# Patient Record
Sex: Female | Born: 1935
Health system: Southern US, Community
[De-identification: ages and names within clinical notes are randomized; demographics above are authoritative.]

## PROBLEM LIST (undated history)

## (undated) ENCOUNTER — Emergency Department (HOSPITAL_BASED_OUTPATIENT_CLINIC_OR_DEPARTMENT_OTHER): Admission: EM | Discharge status: Absent without leave | Payer: Medicare Other

## (undated) DIAGNOSIS — I10 Essential (primary) hypertension: Secondary | ICD-10-CM

## (undated) DIAGNOSIS — C801 Malignant (primary) neoplasm, unspecified: Secondary | ICD-10-CM

## (undated) DIAGNOSIS — R001 Bradycardia, unspecified: Secondary | ICD-10-CM

## (undated) DIAGNOSIS — Z853 Personal history of malignant neoplasm of breast: Secondary | ICD-10-CM

## (undated) DIAGNOSIS — J189 Pneumonia, unspecified organism: Secondary | ICD-10-CM

## (undated) DIAGNOSIS — I482 Chronic atrial fibrillation, unspecified: Secondary | ICD-10-CM

## (undated) DIAGNOSIS — I4819 Other persistent atrial fibrillation: Secondary | ICD-10-CM

## (undated) DIAGNOSIS — M8580 Other specified disorders of bone density and structure, unspecified site: Secondary | ICD-10-CM

## (undated) DIAGNOSIS — I5032 Chronic diastolic (congestive) heart failure: Secondary | ICD-10-CM

## (undated) DIAGNOSIS — I499 Cardiac arrhythmia, unspecified: Secondary | ICD-10-CM

## (undated) DIAGNOSIS — E785 Hyperlipidemia, unspecified: Secondary | ICD-10-CM

## (undated) DIAGNOSIS — I712 Thoracic aortic aneurysm, without rupture: Secondary | ICD-10-CM

## (undated) DIAGNOSIS — K5792 Diverticulitis of intestine, part unspecified, without perforation or abscess without bleeding: Secondary | ICD-10-CM

## (undated) DIAGNOSIS — F5104 Psychophysiologic insomnia: Secondary | ICD-10-CM

## (undated) DIAGNOSIS — K5790 Diverticulosis of intestine, part unspecified, without perforation or abscess without bleeding: Secondary | ICD-10-CM

## (undated) DIAGNOSIS — E119 Type 2 diabetes mellitus without complications: Secondary | ICD-10-CM

## (undated) DIAGNOSIS — S329XXA Fracture of unspecified parts of lumbosacral spine and pelvis, initial encounter for closed fracture: Secondary | ICD-10-CM

## (undated) DIAGNOSIS — R0600 Dyspnea, unspecified: Secondary | ICD-10-CM

## (undated) DIAGNOSIS — Z7901 Long term (current) use of anticoagulants: Secondary | ICD-10-CM

## (undated) DIAGNOSIS — I358 Other nonrheumatic aortic valve disorders: Secondary | ICD-10-CM

## (undated) DIAGNOSIS — M1990 Unspecified osteoarthritis, unspecified site: Secondary | ICD-10-CM

## (undated) DIAGNOSIS — R9430 Abnormal result of cardiovascular function study, unspecified: Secondary | ICD-10-CM

## (undated) DIAGNOSIS — S7290XA Unspecified fracture of unspecified femur, initial encounter for closed fracture: Secondary | ICD-10-CM

## (undated) DIAGNOSIS — R55 Syncope and collapse: Secondary | ICD-10-CM

## (undated) HISTORY — DX: Diverticulitis of intestine, part unspecified, without perforation or abscess without bleeding: K57.92

## (undated) HISTORY — PX: SHOULDER SURGERY: SHX246

## (undated) HISTORY — DX: Hyperlipidemia, unspecified: E78.5

## (undated) HISTORY — DX: Abnormal result of cardiovascular function study, unspecified: R94.30

## (undated) HISTORY — PX: WRIST SURGERY: SHX841

## (undated) HISTORY — DX: Essential (primary) hypertension: I10

## (undated) HISTORY — DX: Personal history of malignant neoplasm of breast: Z85.3

## (undated) HISTORY — PX: EYE SURGERY: SHX253

## (undated) HISTORY — DX: Psychophysiologic insomnia: F51.04

## (undated) HISTORY — DX: Other persistent atrial fibrillation: I48.19

## (undated) HISTORY — PX: TONSILLECTOMY: SUR1361

## (undated) HISTORY — DX: Fracture of unspecified parts of lumbosacral spine and pelvis, initial encounter for closed fracture: S32.9XXA

## (undated) HISTORY — DX: Thoracic aortic aneurysm, without rupture: I71.2

## (undated) HISTORY — DX: Other nonrheumatic aortic valve disorders: I35.8

## (undated) HISTORY — DX: Bradycardia, unspecified: R00.1

## (undated) HISTORY — DX: Unspecified osteoarthritis, unspecified site: M19.90

## (undated) HISTORY — DX: Syncope and collapse: R55

## (undated) HISTORY — DX: Diverticulosis of intestine, part unspecified, without perforation or abscess without bleeding: K57.90

## (undated) HISTORY — DX: Unspecified fracture of unspecified femur, initial encounter for closed fracture: S72.90XA

## (undated) HISTORY — DX: Chronic diastolic (congestive) heart failure: I50.32

## (undated) HISTORY — DX: Chronic atrial fibrillation, unspecified: I48.20

## (undated) HISTORY — DX: Type 2 diabetes mellitus without complications: E11.9

## (undated) HISTORY — DX: Long term (current) use of anticoagulants: Z79.01

---

## 1993-05-24 HISTORY — PX: MASTECTOMY: SHX3

## 1993-05-24 HISTORY — PX: TOTAL ABDOMINAL HYSTERECTOMY W/ BILATERAL SALPINGOOPHORECTOMY: SHX83

## 1997-08-26 ENCOUNTER — Inpatient Hospital Stay (HOSPITAL_COMMUNITY): Admission: RE | Admit: 1997-08-26 | Discharge: 1997-08-27 | Payer: Self-pay | Admitting: General Surgery

## 1997-09-09 ENCOUNTER — Other Ambulatory Visit: Admission: RE | Admit: 1997-09-09 | Discharge: 1997-09-09 | Payer: Self-pay | Admitting: Oncology

## 1998-09-17 ENCOUNTER — Other Ambulatory Visit: Admission: RE | Admit: 1998-09-17 | Discharge: 1998-09-17 | Payer: Self-pay | Admitting: Obstetrics & Gynecology

## 1999-09-21 ENCOUNTER — Encounter: Payer: Self-pay | Admitting: General Surgery

## 1999-09-23 ENCOUNTER — Encounter (INDEPENDENT_AMBULATORY_CARE_PROVIDER_SITE_OTHER): Payer: Self-pay | Admitting: *Deleted

## 1999-09-23 ENCOUNTER — Encounter: Payer: Self-pay | Admitting: General Surgery

## 1999-09-23 ENCOUNTER — Ambulatory Visit (HOSPITAL_COMMUNITY): Admission: RE | Admit: 1999-09-23 | Discharge: 1999-09-23 | Payer: Self-pay | Admitting: General Surgery

## 2000-02-26 ENCOUNTER — Encounter: Admission: RE | Admit: 2000-02-26 | Discharge: 2000-03-04 | Payer: Self-pay | Admitting: General Surgery

## 2000-10-14 ENCOUNTER — Encounter (HOSPITAL_COMMUNITY): Admission: RE | Admit: 2000-10-14 | Discharge: 2000-11-13 | Payer: Self-pay | Admitting: Oncology

## 2000-10-14 ENCOUNTER — Encounter: Admission: RE | Admit: 2000-10-14 | Discharge: 2000-10-14 | Payer: Self-pay | Admitting: Oncology

## 2001-02-27 ENCOUNTER — Encounter: Admission: RE | Admit: 2001-02-27 | Discharge: 2001-02-27 | Payer: Self-pay | Admitting: Internal Medicine

## 2001-02-27 ENCOUNTER — Encounter: Payer: Self-pay | Admitting: Internal Medicine

## 2001-04-11 ENCOUNTER — Encounter: Admission: RE | Admit: 2001-04-11 | Discharge: 2001-04-11 | Payer: Self-pay | Admitting: Oncology

## 2001-04-11 ENCOUNTER — Encounter (HOSPITAL_COMMUNITY): Admission: RE | Admit: 2001-04-11 | Discharge: 2001-05-11 | Payer: Self-pay | Admitting: Oncology

## 2001-08-23 ENCOUNTER — Emergency Department (HOSPITAL_COMMUNITY): Admission: EM | Admit: 2001-08-23 | Discharge: 2001-08-24 | Payer: Self-pay | Admitting: Emergency Medicine

## 2001-08-24 ENCOUNTER — Encounter: Payer: Self-pay | Admitting: Emergency Medicine

## 2001-09-25 ENCOUNTER — Encounter: Payer: Self-pay | Admitting: Orthopedic Surgery

## 2001-10-02 ENCOUNTER — Inpatient Hospital Stay (HOSPITAL_COMMUNITY): Admission: RE | Admit: 2001-10-02 | Discharge: 2001-10-03 | Payer: Self-pay | Admitting: Orthopedic Surgery

## 2001-10-07 ENCOUNTER — Emergency Department (HOSPITAL_COMMUNITY): Admission: EM | Admit: 2001-10-07 | Discharge: 2001-10-07 | Payer: Self-pay | Admitting: Emergency Medicine

## 2001-10-07 ENCOUNTER — Encounter: Payer: Self-pay | Admitting: Emergency Medicine

## 2001-11-14 ENCOUNTER — Encounter: Admission: RE | Admit: 2001-11-14 | Discharge: 2001-12-22 | Payer: Self-pay | Admitting: Orthopedic Surgery

## 2002-03-27 ENCOUNTER — Encounter (HOSPITAL_COMMUNITY): Admission: RE | Admit: 2002-03-27 | Discharge: 2002-04-26 | Payer: Self-pay | Admitting: Oncology

## 2002-03-27 ENCOUNTER — Encounter: Admission: RE | Admit: 2002-03-27 | Discharge: 2002-03-27 | Payer: Self-pay | Admitting: Oncology

## 2002-08-15 ENCOUNTER — Encounter: Payer: Self-pay | Admitting: General Surgery

## 2002-08-15 ENCOUNTER — Encounter (INDEPENDENT_AMBULATORY_CARE_PROVIDER_SITE_OTHER): Payer: Self-pay | Admitting: *Deleted

## 2002-08-15 ENCOUNTER — Encounter: Admission: RE | Admit: 2002-08-15 | Discharge: 2002-08-15 | Payer: Self-pay | Admitting: General Surgery

## 2002-09-25 ENCOUNTER — Encounter (HOSPITAL_COMMUNITY): Admission: RE | Admit: 2002-09-25 | Discharge: 2002-10-25 | Payer: Self-pay | Admitting: Oncology

## 2002-09-25 ENCOUNTER — Encounter: Admission: RE | Admit: 2002-09-25 | Discharge: 2002-09-25 | Payer: Self-pay | Admitting: Oncology

## 2003-01-01 ENCOUNTER — Encounter: Payer: Self-pay | Admitting: Internal Medicine

## 2003-01-01 ENCOUNTER — Encounter: Admission: RE | Admit: 2003-01-01 | Discharge: 2003-01-01 | Payer: Self-pay | Admitting: Internal Medicine

## 2003-03-27 ENCOUNTER — Encounter: Admission: RE | Admit: 2003-03-27 | Discharge: 2003-03-27 | Payer: Self-pay | Admitting: Oncology

## 2003-05-25 DIAGNOSIS — S7290XA Unspecified fracture of unspecified femur, initial encounter for closed fracture: Secondary | ICD-10-CM

## 2003-05-25 HISTORY — PX: FEMUR SURGERY: SHX943

## 2003-05-25 HISTORY — DX: Unspecified fracture of unspecified femur, initial encounter for closed fracture: S72.90XA

## 2003-08-26 ENCOUNTER — Encounter: Admission: RE | Admit: 2003-08-26 | Discharge: 2003-08-26 | Payer: Self-pay | Admitting: General Surgery

## 2003-09-05 ENCOUNTER — Inpatient Hospital Stay (HOSPITAL_COMMUNITY): Admission: EM | Admit: 2003-09-05 | Discharge: 2003-09-07 | Payer: Self-pay | Admitting: Family Medicine

## 2003-09-27 ENCOUNTER — Encounter: Admission: RE | Admit: 2003-09-27 | Discharge: 2003-09-27 | Payer: Self-pay | Admitting: Oncology

## 2003-09-27 ENCOUNTER — Encounter (HOSPITAL_COMMUNITY): Admission: RE | Admit: 2003-09-27 | Discharge: 2003-10-27 | Payer: Self-pay | Admitting: Oncology

## 2004-03-02 ENCOUNTER — Ambulatory Visit: Payer: Self-pay | Admitting: Physical Medicine & Rehabilitation

## 2004-03-02 ENCOUNTER — Inpatient Hospital Stay (HOSPITAL_COMMUNITY): Admission: EM | Admit: 2004-03-02 | Discharge: 2004-03-09 | Payer: Self-pay | Admitting: Emergency Medicine

## 2004-03-02 ENCOUNTER — Ambulatory Visit: Payer: Self-pay | Admitting: Internal Medicine

## 2004-03-09 ENCOUNTER — Inpatient Hospital Stay (HOSPITAL_COMMUNITY)
Admission: RE | Admit: 2004-03-09 | Discharge: 2004-03-17 | Payer: Self-pay | Admitting: Physical Medicine & Rehabilitation

## 2004-03-30 ENCOUNTER — Ambulatory Visit: Payer: Self-pay | Admitting: Cardiology

## 2004-04-06 ENCOUNTER — Ambulatory Visit: Payer: Self-pay | Admitting: Internal Medicine

## 2004-04-29 ENCOUNTER — Ambulatory Visit: Payer: Self-pay | Admitting: Internal Medicine

## 2004-05-24 DIAGNOSIS — S329XXA Fracture of unspecified parts of lumbosacral spine and pelvis, initial encounter for closed fracture: Secondary | ICD-10-CM

## 2004-05-24 HISTORY — DX: Fracture of unspecified parts of lumbosacral spine and pelvis, initial encounter for closed fracture: S32.9XXA

## 2004-06-03 ENCOUNTER — Ambulatory Visit: Payer: Self-pay | Admitting: Internal Medicine

## 2004-06-18 ENCOUNTER — Ambulatory Visit: Payer: Self-pay | Admitting: Internal Medicine

## 2004-07-16 ENCOUNTER — Ambulatory Visit: Payer: Self-pay | Admitting: Internal Medicine

## 2004-08-19 ENCOUNTER — Ambulatory Visit: Payer: Self-pay | Admitting: Internal Medicine

## 2004-08-26 ENCOUNTER — Ambulatory Visit: Payer: Self-pay | Admitting: Internal Medicine

## 2004-08-26 ENCOUNTER — Encounter: Admission: RE | Admit: 2004-08-26 | Discharge: 2004-08-26 | Payer: Self-pay | Admitting: General Surgery

## 2004-08-27 ENCOUNTER — Ambulatory Visit: Payer: Self-pay | Admitting: Internal Medicine

## 2004-09-02 ENCOUNTER — Ambulatory Visit: Payer: Self-pay | Admitting: Internal Medicine

## 2004-09-28 ENCOUNTER — Encounter: Admission: RE | Admit: 2004-09-28 | Discharge: 2004-09-28 | Payer: Self-pay | Admitting: Oncology

## 2004-09-28 ENCOUNTER — Encounter (HOSPITAL_COMMUNITY): Admission: RE | Admit: 2004-09-28 | Discharge: 2004-10-28 | Payer: Self-pay | Admitting: Oncology

## 2004-09-28 ENCOUNTER — Ambulatory Visit (HOSPITAL_COMMUNITY): Payer: Self-pay | Admitting: Oncology

## 2004-10-02 ENCOUNTER — Ambulatory Visit: Payer: Self-pay | Admitting: Internal Medicine

## 2004-10-14 ENCOUNTER — Other Ambulatory Visit: Admission: RE | Admit: 2004-10-14 | Discharge: 2004-10-14 | Payer: Self-pay | Admitting: Obstetrics & Gynecology

## 2004-10-23 ENCOUNTER — Ambulatory Visit: Payer: Self-pay | Admitting: Internal Medicine

## 2004-11-04 ENCOUNTER — Ambulatory Visit: Payer: Self-pay | Admitting: Internal Medicine

## 2004-11-04 ENCOUNTER — Ambulatory Visit: Payer: Self-pay | Admitting: Gastroenterology

## 2004-11-18 ENCOUNTER — Ambulatory Visit: Payer: Self-pay | Admitting: Internal Medicine

## 2004-12-16 ENCOUNTER — Ambulatory Visit: Payer: Self-pay | Admitting: Internal Medicine

## 2004-12-23 ENCOUNTER — Ambulatory Visit: Payer: Self-pay | Admitting: Internal Medicine

## 2004-12-31 ENCOUNTER — Ambulatory Visit: Payer: Self-pay | Admitting: Physical Medicine & Rehabilitation

## 2004-12-31 ENCOUNTER — Inpatient Hospital Stay (HOSPITAL_COMMUNITY): Admission: EM | Admit: 2004-12-31 | Discharge: 2005-01-05 | Payer: Self-pay | Admitting: Emergency Medicine

## 2004-12-31 ENCOUNTER — Ambulatory Visit: Payer: Self-pay | Admitting: Internal Medicine

## 2005-01-05 ENCOUNTER — Inpatient Hospital Stay
Admission: RE | Admit: 2005-01-05 | Discharge: 2005-01-12 | Payer: Self-pay | Admitting: Physical Medicine & Rehabilitation

## 2005-02-18 ENCOUNTER — Ambulatory Visit: Payer: Self-pay | Admitting: Internal Medicine

## 2005-03-02 ENCOUNTER — Ambulatory Visit: Payer: Self-pay | Admitting: Internal Medicine

## 2005-03-24 ENCOUNTER — Ambulatory Visit: Payer: Self-pay | Admitting: Internal Medicine

## 2005-04-06 ENCOUNTER — Encounter: Admission: RE | Admit: 2005-04-06 | Discharge: 2005-04-06 | Payer: Self-pay | Admitting: Specialist

## 2005-04-07 ENCOUNTER — Encounter: Admission: RE | Admit: 2005-04-07 | Discharge: 2005-04-07 | Payer: Self-pay | Admitting: Specialist

## 2005-04-28 ENCOUNTER — Ambulatory Visit: Payer: Self-pay | Admitting: Internal Medicine

## 2005-05-04 ENCOUNTER — Ambulatory Visit: Payer: Self-pay | Admitting: Internal Medicine

## 2005-05-31 ENCOUNTER — Ambulatory Visit: Payer: Self-pay | Admitting: Internal Medicine

## 2005-06-08 ENCOUNTER — Encounter: Payer: Self-pay | Admitting: Internal Medicine

## 2005-06-15 ENCOUNTER — Ambulatory Visit: Payer: Self-pay | Admitting: Internal Medicine

## 2005-06-29 ENCOUNTER — Ambulatory Visit: Payer: Self-pay | Admitting: Internal Medicine

## 2005-07-16 ENCOUNTER — Ambulatory Visit: Payer: Self-pay | Admitting: Internal Medicine

## 2005-08-27 ENCOUNTER — Encounter: Admission: RE | Admit: 2005-08-27 | Discharge: 2005-08-27 | Payer: Self-pay | Admitting: General Surgery

## 2005-09-01 ENCOUNTER — Encounter: Admission: RE | Admit: 2005-09-01 | Discharge: 2005-09-01 | Payer: Self-pay | Admitting: General Surgery

## 2005-09-14 ENCOUNTER — Ambulatory Visit: Payer: Self-pay | Admitting: Internal Medicine

## 2005-09-27 ENCOUNTER — Encounter: Admission: RE | Admit: 2005-09-27 | Discharge: 2005-09-27 | Payer: Self-pay | Admitting: Oncology

## 2005-09-27 ENCOUNTER — Ambulatory Visit (HOSPITAL_COMMUNITY): Payer: Self-pay | Admitting: Oncology

## 2005-09-27 ENCOUNTER — Encounter (HOSPITAL_COMMUNITY): Admission: RE | Admit: 2005-09-27 | Discharge: 2005-10-27 | Payer: Self-pay | Admitting: Oncology

## 2005-10-04 ENCOUNTER — Encounter: Admission: RE | Admit: 2005-10-04 | Discharge: 2005-10-29 | Payer: Self-pay | Admitting: Specialist

## 2005-11-15 ENCOUNTER — Ambulatory Visit: Payer: Self-pay | Admitting: Internal Medicine

## 2006-01-13 ENCOUNTER — Ambulatory Visit: Payer: Self-pay | Admitting: Internal Medicine

## 2006-01-27 ENCOUNTER — Ambulatory Visit: Payer: Self-pay | Admitting: Internal Medicine

## 2006-03-03 ENCOUNTER — Encounter: Admission: RE | Admit: 2006-03-03 | Discharge: 2006-03-03 | Payer: Self-pay | Admitting: General Surgery

## 2006-03-31 ENCOUNTER — Ambulatory Visit: Payer: Self-pay | Admitting: Internal Medicine

## 2006-04-29 ENCOUNTER — Ambulatory Visit: Payer: Self-pay | Admitting: Internal Medicine

## 2006-06-10 ENCOUNTER — Ambulatory Visit: Payer: Self-pay | Admitting: Internal Medicine

## 2006-06-10 LAB — CONVERTED CEMR LAB
HCT: 45.2 % (ref 36.0–46.0)
Hgb A1c MFr Bld: 6.2 % — ABNORMAL HIGH (ref 4.6–6.0)

## 2006-06-17 ENCOUNTER — Ambulatory Visit: Payer: Self-pay | Admitting: Internal Medicine

## 2006-06-20 ENCOUNTER — Ambulatory Visit: Payer: Self-pay | Admitting: Internal Medicine

## 2006-06-27 ENCOUNTER — Ambulatory Visit: Payer: Self-pay | Admitting: Internal Medicine

## 2006-06-28 ENCOUNTER — Ambulatory Visit: Payer: Self-pay | Admitting: Cardiology

## 2006-06-28 ENCOUNTER — Encounter: Payer: Self-pay | Admitting: Cardiology

## 2006-06-28 ENCOUNTER — Ambulatory Visit: Payer: Self-pay

## 2006-06-28 ENCOUNTER — Ambulatory Visit: Payer: Self-pay | Admitting: Internal Medicine

## 2006-06-28 LAB — CONVERTED CEMR LAB
ALT: 11 units/L (ref 0–40)
AST: 24 units/L (ref 0–37)
Albumin: 3.6 g/dL (ref 3.5–5.2)
Alkaline Phosphatase: 69 units/L (ref 39–117)
BUN: 18 mg/dL (ref 6–23)
Bilirubin, Direct: 0.1 mg/dL (ref 0.0–0.3)
CO2: 32 meq/L (ref 19–32)
Calcium: 9.3 mg/dL (ref 8.4–10.5)
Chloride: 105 meq/L (ref 96–112)
Creatinine, Ser: 0.9 mg/dL (ref 0.4–1.2)
Free T4: 0.9 ng/dL (ref 0.6–1.6)
GFR calc Af Amer: 77 mL/min
GFR calc non Af Amer: 64 mL/min
Glucose, Bld: 140 mg/dL — ABNORMAL HIGH (ref 70–99)
Potassium: 3.4 meq/L — ABNORMAL LOW (ref 3.5–5.1)
Sodium: 142 meq/L (ref 135–145)
T3, Free: 3.3 pg/mL (ref 2.3–4.2)
TSH: 4.33 microintl units/mL (ref 0.35–5.50)
Total Bilirubin: 0.6 mg/dL (ref 0.3–1.2)
Total Protein: 5.9 g/dL — ABNORMAL LOW (ref 6.0–8.3)

## 2006-06-30 ENCOUNTER — Ambulatory Visit: Payer: Self-pay | Admitting: Internal Medicine

## 2006-07-05 ENCOUNTER — Ambulatory Visit: Payer: Self-pay

## 2006-07-05 LAB — CONVERTED CEMR LAB
BUN: 18 mg/dL (ref 6–23)
CO2: 32 meq/L (ref 19–32)
Calcium: 9.5 mg/dL (ref 8.4–10.5)
Chloride: 100 meq/L (ref 96–112)
Creatinine, Ser: 0.7 mg/dL (ref 0.4–1.2)
GFR calc Af Amer: 103 mL/min
GFR calc non Af Amer: 85 mL/min
Glucose, Bld: 152 mg/dL — ABNORMAL HIGH (ref 70–99)
Potassium: 4.1 meq/L (ref 3.5–5.1)
Sodium: 141 meq/L (ref 135–145)

## 2006-07-07 ENCOUNTER — Ambulatory Visit: Payer: Self-pay | Admitting: Internal Medicine

## 2006-07-12 ENCOUNTER — Ambulatory Visit: Payer: Self-pay | Admitting: Internal Medicine

## 2006-07-14 ENCOUNTER — Ambulatory Visit: Payer: Self-pay | Admitting: Cardiology

## 2006-07-26 ENCOUNTER — Ambulatory Visit: Payer: Self-pay | Admitting: Internal Medicine

## 2006-08-05 ENCOUNTER — Ambulatory Visit: Payer: Self-pay | Admitting: Internal Medicine

## 2006-08-24 ENCOUNTER — Ambulatory Visit: Payer: Self-pay | Admitting: Cardiology

## 2006-08-24 LAB — CONVERTED CEMR LAB
BUN: 25 mg/dL — ABNORMAL HIGH (ref 6–23)
CO2: 33 meq/L — ABNORMAL HIGH (ref 19–32)
Calcium: 9.7 mg/dL (ref 8.4–10.5)
Chloride: 106 meq/L (ref 96–112)
Creatinine, Ser: 0.9 mg/dL (ref 0.4–1.2)
GFR calc Af Amer: 77 mL/min
GFR calc non Af Amer: 64 mL/min
Glucose, Bld: 94 mg/dL (ref 70–99)
Potassium: 4.9 meq/L (ref 3.5–5.1)
Sodium: 144 meq/L (ref 135–145)

## 2006-08-26 ENCOUNTER — Ambulatory Visit: Payer: Self-pay | Admitting: Internal Medicine

## 2006-08-29 ENCOUNTER — Encounter: Admission: RE | Admit: 2006-08-29 | Discharge: 2006-08-29 | Payer: Self-pay | Admitting: General Surgery

## 2006-09-01 ENCOUNTER — Ambulatory Visit: Payer: Self-pay

## 2006-09-20 ENCOUNTER — Ambulatory Visit: Payer: Self-pay | Admitting: Cardiology

## 2006-09-23 ENCOUNTER — Ambulatory Visit: Payer: Self-pay | Admitting: Internal Medicine

## 2006-09-27 ENCOUNTER — Telehealth: Payer: Self-pay | Admitting: Internal Medicine

## 2006-09-27 ENCOUNTER — Ambulatory Visit (HOSPITAL_COMMUNITY): Payer: Self-pay | Admitting: Oncology

## 2006-09-27 ENCOUNTER — Encounter (HOSPITAL_COMMUNITY): Admission: RE | Admit: 2006-09-27 | Discharge: 2006-10-27 | Payer: Self-pay | Admitting: Oncology

## 2006-10-21 ENCOUNTER — Ambulatory Visit: Payer: Self-pay | Admitting: Internal Medicine

## 2006-11-01 ENCOUNTER — Ambulatory Visit: Payer: Self-pay | Admitting: Internal Medicine

## 2006-11-01 LAB — CONVERTED CEMR LAB
BUN: 13 mg/dL (ref 6–23)
Basophils Absolute: 0 10*3/uL (ref 0.0–0.1)
Basophils Relative: 0.4 % (ref 0.0–1.0)
CO2: 29 meq/L (ref 19–32)
Calcium: 8.6 mg/dL (ref 8.4–10.5)
Chloride: 104 meq/L (ref 96–112)
Creatinine, Ser: 0.7 mg/dL (ref 0.4–1.2)
Eosinophils Absolute: 0.1 10*3/uL (ref 0.0–0.6)
Eosinophils Relative: 2.6 % (ref 0.0–5.0)
GFR calc Af Amer: 103 mL/min
GFR calc non Af Amer: 85 mL/min
Glucose, Bld: 126 mg/dL — ABNORMAL HIGH (ref 70–99)
HCT: 42.7 % (ref 36.0–46.0)
Hemoglobin: 14.8 g/dL (ref 12.0–15.0)
Hgb A1c MFr Bld: 6.9 % — ABNORMAL HIGH (ref 4.6–6.0)
Lymphocytes Relative: 34.5 % (ref 12.0–46.0)
MCHC: 34.6 g/dL (ref 30.0–36.0)
MCV: 92.1 fL (ref 78.0–100.0)
Monocytes Absolute: 0.4 10*3/uL (ref 0.2–0.7)
Monocytes Relative: 12.2 % — ABNORMAL HIGH (ref 3.0–11.0)
Neutro Abs: 1.7 10*3/uL (ref 1.4–7.7)
Neutrophils Relative %: 50.3 % (ref 43.0–77.0)
Platelets: 171 10*3/uL (ref 150–400)
Potassium: 4.3 meq/L (ref 3.5–5.1)
RBC: 4.64 M/uL (ref 3.87–5.11)
RDW: 13 % (ref 11.5–14.6)
Sodium: 140 meq/L (ref 135–145)
WBC: 3.4 10*3/uL — ABNORMAL LOW (ref 4.5–10.5)

## 2006-11-21 ENCOUNTER — Ambulatory Visit: Payer: Self-pay | Admitting: Internal Medicine

## 2006-12-26 ENCOUNTER — Ambulatory Visit: Payer: Self-pay | Admitting: Internal Medicine

## 2006-12-26 LAB — CONVERTED CEMR LAB
INR: 1.3
Lead-Whole Blood: 14.2 ug/dL

## 2007-01-09 ENCOUNTER — Ambulatory Visit: Payer: Self-pay | Admitting: Internal Medicine

## 2007-01-09 DIAGNOSIS — I4819 Other persistent atrial fibrillation: Secondary | ICD-10-CM | POA: Insufficient documentation

## 2007-01-09 LAB — CONVERTED CEMR LAB
INR: 3
Prothrombin Time: 20.9 s

## 2007-02-01 ENCOUNTER — Ambulatory Visit: Payer: Self-pay | Admitting: Internal Medicine

## 2007-02-01 DIAGNOSIS — E118 Type 2 diabetes mellitus with unspecified complications: Secondary | ICD-10-CM | POA: Insufficient documentation

## 2007-02-01 DIAGNOSIS — M810 Age-related osteoporosis without current pathological fracture: Secondary | ICD-10-CM | POA: Insufficient documentation

## 2007-02-01 DIAGNOSIS — I10 Essential (primary) hypertension: Secondary | ICD-10-CM | POA: Insufficient documentation

## 2007-02-01 LAB — CONVERTED CEMR LAB: Hgb A1c MFr Bld: 6.8 % — ABNORMAL HIGH (ref 4.6–6.0)

## 2007-02-09 ENCOUNTER — Ambulatory Visit: Payer: Self-pay | Admitting: Internal Medicine

## 2007-02-09 LAB — CONVERTED CEMR LAB
INR: 2.3
Prothrombin Time: 18.4 s

## 2007-03-09 ENCOUNTER — Ambulatory Visit: Payer: Self-pay | Admitting: Cardiology

## 2007-03-10 ENCOUNTER — Ambulatory Visit: Payer: Self-pay | Admitting: Internal Medicine

## 2007-03-20 ENCOUNTER — Ambulatory Visit: Payer: Self-pay | Admitting: Cardiology

## 2007-03-20 LAB — CONVERTED CEMR LAB: Digitoxin Lvl: 0.4 ng/mL — ABNORMAL LOW (ref 0.8–2.0)

## 2007-03-28 ENCOUNTER — Ambulatory Visit: Payer: Self-pay | Admitting: Cardiology

## 2007-04-07 ENCOUNTER — Ambulatory Visit: Payer: Self-pay | Admitting: Internal Medicine

## 2007-04-07 LAB — CONVERTED CEMR LAB
INR: 1.6
INR: 3.3
Prothrombin Time: 15.5 s
Prothrombin Time: 21.8 s

## 2007-04-10 ENCOUNTER — Telehealth: Payer: Self-pay | Admitting: *Deleted

## 2007-04-17 ENCOUNTER — Ambulatory Visit: Payer: Self-pay | Admitting: Cardiology

## 2007-05-08 ENCOUNTER — Ambulatory Visit: Payer: Self-pay | Admitting: Internal Medicine

## 2007-05-08 DIAGNOSIS — M1990 Unspecified osteoarthritis, unspecified site: Secondary | ICD-10-CM | POA: Insufficient documentation

## 2007-05-08 LAB — CONVERTED CEMR LAB
BUN: 16 mg/dL (ref 6–23)
CO2: 34 meq/L — ABNORMAL HIGH (ref 19–32)
Calcium: 10.1 mg/dL (ref 8.4–10.5)
Chloride: 101 meq/L (ref 96–112)
Creatinine, Ser: 0.7 mg/dL (ref 0.4–1.2)
GFR calc Af Amer: 103 mL/min
GFR calc non Af Amer: 85 mL/min
Glucose, Bld: 133 mg/dL — ABNORMAL HIGH (ref 70–99)
Hgb A1c MFr Bld: 6.9 % — ABNORMAL HIGH (ref 4.6–6.0)
INR: 3.4
Potassium: 5 meq/L (ref 3.5–5.1)
Prothrombin Time: 22.4 s
Sodium: 142 meq/L (ref 135–145)

## 2007-06-02 ENCOUNTER — Ambulatory Visit: Payer: Self-pay | Admitting: Internal Medicine

## 2007-06-02 DIAGNOSIS — J069 Acute upper respiratory infection, unspecified: Secondary | ICD-10-CM | POA: Insufficient documentation

## 2007-06-08 ENCOUNTER — Ambulatory Visit: Payer: Self-pay | Admitting: Internal Medicine

## 2007-06-08 LAB — CONVERTED CEMR LAB
INR: 3.1
Prothrombin Time: 21.1 s

## 2007-07-04 ENCOUNTER — Ambulatory Visit: Payer: Self-pay | Admitting: Internal Medicine

## 2007-07-04 LAB — CONVERTED CEMR LAB
INR: 2.4
Prothrombin Time: 19 s

## 2007-08-07 ENCOUNTER — Ambulatory Visit: Payer: Self-pay | Admitting: Internal Medicine

## 2007-08-07 LAB — CONVERTED CEMR LAB
BUN: 17 mg/dL (ref 6–23)
CO2: 33 meq/L — ABNORMAL HIGH (ref 19–32)
Calcium: 9.6 mg/dL (ref 8.4–10.5)
Chloride: 103 meq/L (ref 96–112)
Creatinine, Ser: 0.7 mg/dL (ref 0.4–1.2)
GFR calc Af Amer: 103 mL/min
GFR calc non Af Amer: 85 mL/min
Glucose, Bld: 247 mg/dL — ABNORMAL HIGH (ref 70–99)
Hgb A1c MFr Bld: 7.5 % — ABNORMAL HIGH (ref 4.6–6.0)
INR: 2.4
Potassium: 4.4 meq/L (ref 3.5–5.1)
Prothrombin Time: 18.8 s
Sodium: 140 meq/L (ref 135–145)
Vit D, 1,25-Dihydroxy: 46 (ref 30–89)

## 2007-08-30 ENCOUNTER — Encounter: Admission: RE | Admit: 2007-08-30 | Discharge: 2007-08-30 | Payer: Self-pay | Admitting: General Surgery

## 2007-09-08 ENCOUNTER — Ambulatory Visit: Payer: Self-pay | Admitting: Internal Medicine

## 2007-09-08 LAB — CONVERTED CEMR LAB
INR: 2.8
Prothrombin Time: 20.1 s

## 2007-09-15 ENCOUNTER — Telehealth: Payer: Self-pay | Admitting: Family Medicine

## 2007-09-26 ENCOUNTER — Encounter: Payer: Self-pay | Admitting: Internal Medicine

## 2007-09-26 ENCOUNTER — Ambulatory Visit (HOSPITAL_COMMUNITY): Payer: Self-pay | Admitting: Oncology

## 2007-09-26 ENCOUNTER — Encounter (HOSPITAL_COMMUNITY): Admission: RE | Admit: 2007-09-26 | Discharge: 2007-10-26 | Payer: Self-pay | Admitting: Oncology

## 2007-10-02 ENCOUNTER — Encounter: Admission: RE | Admit: 2007-10-02 | Discharge: 2007-10-02 | Payer: Self-pay | Admitting: Oncology

## 2007-10-09 ENCOUNTER — Ambulatory Visit: Payer: Self-pay | Admitting: Internal Medicine

## 2007-10-09 LAB — CONVERTED CEMR LAB
INR: 2.6
Prothrombin Time: 19.7 s

## 2007-10-24 ENCOUNTER — Ambulatory Visit: Payer: Self-pay | Admitting: Cardiology

## 2007-11-07 ENCOUNTER — Encounter: Payer: Self-pay | Admitting: Internal Medicine

## 2007-11-07 ENCOUNTER — Ambulatory Visit: Payer: Self-pay | Admitting: Internal Medicine

## 2007-11-07 LAB — CONVERTED CEMR LAB
INR: 2
Prothrombin Time: 17.5 s

## 2007-12-05 ENCOUNTER — Ambulatory Visit: Payer: Self-pay | Admitting: Internal Medicine

## 2007-12-05 LAB — CONVERTED CEMR LAB
INR: 2
Prothrombin Time: 17.5 s

## 2008-01-02 ENCOUNTER — Ambulatory Visit: Payer: Self-pay | Admitting: Internal Medicine

## 2008-01-02 LAB — CONVERTED CEMR LAB
ALT: 18 units/L (ref 0–35)
AST: 28 units/L (ref 0–37)
Albumin: 3.6 g/dL (ref 3.5–5.2)
Alkaline Phosphatase: 67 units/L (ref 39–117)
BUN: 18 mg/dL (ref 6–23)
Bilirubin, Direct: 0.1 mg/dL (ref 0.0–0.3)
CO2: 28 meq/L (ref 19–32)
Calcium: 9.3 mg/dL (ref 8.4–10.5)
Chloride: 104 meq/L (ref 96–112)
Cholesterol: 206 mg/dL (ref 0–200)
Creatinine, Ser: 0.7 mg/dL (ref 0.4–1.2)
Direct LDL: 128.7 mg/dL
GFR calc Af Amer: 103 mL/min
GFR calc non Af Amer: 85 mL/min
Glucose, Bld: 150 mg/dL — ABNORMAL HIGH (ref 70–99)
HDL: 47.4 mg/dL (ref >39.0)
Hgb A1c MFr Bld: 8.2 % — ABNORMAL HIGH (ref 4.6–6.0)
Potassium: 3.8 meq/L (ref 3.5–5.1)
Sodium: 140 meq/L (ref 135–145)
Total Bilirubin: 0.9 mg/dL (ref 0.3–1.2)
Total CHOL/HDL Ratio: 4.3
Total Protein: 5.9 g/dL — ABNORMAL LOW (ref 6.0–8.3)
Triglycerides: 188 mg/dL — ABNORMAL HIGH (ref 0–149)
VLDL: 38 mg/dL (ref 0–40)

## 2008-01-09 ENCOUNTER — Ambulatory Visit: Payer: Self-pay | Admitting: Internal Medicine

## 2008-01-09 LAB — CONVERTED CEMR LAB: Vit D, 1,25-Dihydroxy: 39 (ref 30–89)

## 2008-01-10 LAB — CONVERTED CEMR LAB
Calcium: 9.9 mg/dL (ref 8.4–10.5)
Magnesium: 2.3 mg/dL (ref 1.5–2.5)
TSH: 1.1 microintl units/mL (ref 0.35–5.50)

## 2008-01-11 ENCOUNTER — Telehealth: Payer: Self-pay | Admitting: Internal Medicine

## 2008-01-17 ENCOUNTER — Telehealth: Payer: Self-pay | Admitting: Internal Medicine

## 2008-01-18 ENCOUNTER — Ambulatory Visit: Payer: Self-pay | Admitting: Internal Medicine

## 2008-01-22 ENCOUNTER — Telehealth: Payer: Self-pay | Admitting: Internal Medicine

## 2008-02-02 ENCOUNTER — Ambulatory Visit: Payer: Self-pay | Admitting: Internal Medicine

## 2008-02-02 LAB — CONVERTED CEMR LAB
INR: 1.2
Prothrombin Time: 13.4 s

## 2008-02-14 ENCOUNTER — Ambulatory Visit: Payer: Self-pay | Admitting: Internal Medicine

## 2008-02-14 LAB — CONVERTED CEMR LAB: Hgb A1c MFr Bld: 8 % — ABNORMAL HIGH (ref 4.6–6.0)

## 2008-02-23 ENCOUNTER — Ambulatory Visit: Payer: Self-pay | Admitting: Internal Medicine

## 2008-02-27 ENCOUNTER — Telehealth: Payer: Self-pay | Admitting: *Deleted

## 2008-03-01 ENCOUNTER — Ambulatory Visit: Payer: Self-pay | Admitting: Internal Medicine

## 2008-03-01 LAB — CONVERTED CEMR LAB
INR: 1.8
Prothrombin Time: 16.7 s

## 2008-04-05 ENCOUNTER — Ambulatory Visit: Payer: Self-pay | Admitting: Internal Medicine

## 2008-04-05 LAB — CONVERTED CEMR LAB
INR: 1.7
Prothrombin Time: 16.3 s

## 2008-04-19 ENCOUNTER — Ambulatory Visit: Payer: Self-pay | Admitting: Family Medicine

## 2008-04-19 DIAGNOSIS — J019 Acute sinusitis, unspecified: Secondary | ICD-10-CM | POA: Insufficient documentation

## 2008-04-22 ENCOUNTER — Ambulatory Visit: Payer: Self-pay | Admitting: Internal Medicine

## 2008-04-22 LAB — CONVERTED CEMR LAB
BUN: 13 mg/dL (ref 6–23)
CO2: 31 meq/L (ref 19–32)
Calcium: 9 mg/dL (ref 8.4–10.5)
Chloride: 102 meq/L (ref 96–112)
Creatinine, Ser: 0.7 mg/dL (ref 0.4–1.2)
GFR calc Af Amer: 103 mL/min
GFR calc non Af Amer: 85 mL/min
Glucose, Bld: 308 mg/dL — ABNORMAL HIGH (ref 70–99)
Hgb A1c MFr Bld: 7.5 % — ABNORMAL HIGH (ref 4.6–6.0)
Potassium: 4.6 meq/L (ref 3.5–5.1)
Sodium: 140 meq/L (ref 135–145)

## 2008-04-29 ENCOUNTER — Ambulatory Visit: Payer: Self-pay | Admitting: Internal Medicine

## 2008-05-03 ENCOUNTER — Ambulatory Visit: Payer: Self-pay | Admitting: Internal Medicine

## 2008-05-03 LAB — CONVERTED CEMR LAB
INR: 2
Prothrombin Time: 17.3 s

## 2008-05-16 ENCOUNTER — Emergency Department (HOSPITAL_COMMUNITY): Admission: EM | Admit: 2008-05-16 | Discharge: 2008-05-16 | Payer: Self-pay | Admitting: Family Medicine

## 2008-05-17 ENCOUNTER — Telehealth: Payer: Self-pay | Admitting: Family Medicine

## 2008-05-29 ENCOUNTER — Encounter: Payer: Self-pay | Admitting: Internal Medicine

## 2008-05-31 ENCOUNTER — Ambulatory Visit: Payer: Self-pay | Admitting: Internal Medicine

## 2008-05-31 ENCOUNTER — Encounter: Payer: Self-pay | Admitting: Internal Medicine

## 2008-05-31 LAB — CONVERTED CEMR LAB
INR: 1.4
Prothrombin Time: 14.5 s

## 2008-06-07 ENCOUNTER — Telehealth: Payer: Self-pay | Admitting: Internal Medicine

## 2008-06-21 ENCOUNTER — Ambulatory Visit: Payer: Self-pay | Admitting: Internal Medicine

## 2008-06-21 LAB — CONVERTED CEMR LAB
INR: 2.4
Prothrombin Time: 18.8 s

## 2008-07-02 ENCOUNTER — Telehealth: Payer: Self-pay | Admitting: *Deleted

## 2008-07-12 ENCOUNTER — Encounter: Payer: Self-pay | Admitting: Internal Medicine

## 2008-07-15 ENCOUNTER — Ambulatory Visit: Payer: Self-pay | Admitting: Internal Medicine

## 2008-07-15 DIAGNOSIS — E785 Hyperlipidemia, unspecified: Secondary | ICD-10-CM | POA: Insufficient documentation

## 2008-07-19 ENCOUNTER — Ambulatory Visit: Payer: Self-pay | Admitting: Internal Medicine

## 2008-07-19 LAB — CONVERTED CEMR LAB
INR: 1.6
Prothrombin Time: 15.7 s

## 2008-08-19 ENCOUNTER — Ambulatory Visit: Payer: Self-pay | Admitting: Internal Medicine

## 2008-08-19 LAB — CONVERTED CEMR LAB
INR: 1.6
Prothrombin Time: 15.6 s

## 2008-08-28 ENCOUNTER — Encounter: Payer: Self-pay | Admitting: Internal Medicine

## 2008-08-30 ENCOUNTER — Encounter: Admission: RE | Admit: 2008-08-30 | Discharge: 2008-08-30 | Payer: Self-pay | Admitting: General Surgery

## 2008-09-09 ENCOUNTER — Ambulatory Visit: Payer: Self-pay | Admitting: Internal Medicine

## 2008-09-16 ENCOUNTER — Ambulatory Visit: Payer: Self-pay | Admitting: Internal Medicine

## 2008-09-16 LAB — CONVERTED CEMR LAB
INR: 1.2
Prothrombin Time: 13.7 s

## 2008-09-30 ENCOUNTER — Ambulatory Visit: Payer: Self-pay | Admitting: Internal Medicine

## 2008-09-30 LAB — CONVERTED CEMR LAB
INR: 1.7
Prothrombin Time: 16.1 s

## 2008-10-07 ENCOUNTER — Telehealth (INDEPENDENT_AMBULATORY_CARE_PROVIDER_SITE_OTHER): Payer: Self-pay | Admitting: *Deleted

## 2008-10-08 ENCOUNTER — Encounter (HOSPITAL_COMMUNITY): Admission: RE | Admit: 2008-10-08 | Discharge: 2008-11-07 | Payer: Self-pay | Admitting: Oncology

## 2008-10-08 ENCOUNTER — Ambulatory Visit (HOSPITAL_COMMUNITY): Payer: Self-pay | Admitting: Oncology

## 2008-10-08 ENCOUNTER — Encounter: Payer: Self-pay | Admitting: Internal Medicine

## 2008-10-28 ENCOUNTER — Ambulatory Visit: Payer: Self-pay | Admitting: Internal Medicine

## 2008-10-28 LAB — CONVERTED CEMR LAB
INR: 1.6
Prothrombin Time: 15.8 s

## 2008-11-15 ENCOUNTER — Telehealth: Payer: Self-pay | Admitting: *Deleted

## 2008-11-22 ENCOUNTER — Ambulatory Visit: Payer: Self-pay | Admitting: Internal Medicine

## 2008-11-22 LAB — CONVERTED CEMR LAB
INR: 2.5
Prothrombin Time: 17 s

## 2008-11-27 ENCOUNTER — Encounter: Payer: Self-pay | Admitting: Cardiology

## 2008-11-27 LAB — CONVERTED CEMR LAB
ALT: 17 units/L
AST: 7 units/L
Alkaline Phosphatase: 85 units/L
Potassium: 3.8 meq/L
Sodium: 139 meq/L

## 2008-12-09 ENCOUNTER — Ambulatory Visit: Payer: Self-pay | Admitting: Internal Medicine

## 2008-12-09 LAB — CONVERTED CEMR LAB
Cholesterol: 163 mg/dL
HDL: 68 mg/dL
LDL Cholesterol: 92 mg/dL
Triglycerides: 107 mg/dL
Vit D, 25-Hydroxy: 46 ng/mL (ref 30–89)

## 2008-12-17 DIAGNOSIS — Z853 Personal history of malignant neoplasm of breast: Secondary | ICD-10-CM | POA: Insufficient documentation

## 2008-12-17 DIAGNOSIS — Z8669 Personal history of other diseases of the nervous system and sense organs: Secondary | ICD-10-CM | POA: Insufficient documentation

## 2008-12-17 DIAGNOSIS — Z9010 Acquired absence of unspecified breast and nipple: Secondary | ICD-10-CM | POA: Insufficient documentation

## 2008-12-20 ENCOUNTER — Ambulatory Visit: Payer: Self-pay | Admitting: Internal Medicine

## 2008-12-20 LAB — CONVERTED CEMR LAB
INR: 2.3
Prothrombin Time: 18.5 s

## 2009-01-06 ENCOUNTER — Ambulatory Visit: Payer: Self-pay | Admitting: Internal Medicine

## 2009-01-06 LAB — CONVERTED CEMR LAB
INR: 2
Prothrombin Time: 17.3 s

## 2009-01-31 ENCOUNTER — Ambulatory Visit: Payer: Self-pay | Admitting: Internal Medicine

## 2009-01-31 LAB — CONVERTED CEMR LAB
INR: 3.3
Prothrombin Time: 21.8 s

## 2009-02-25 ENCOUNTER — Ambulatory Visit: Payer: Self-pay | Admitting: Internal Medicine

## 2009-02-25 LAB — CONVERTED CEMR LAB
INR: 2.8
Prothrombin Time: 20.1 s

## 2009-02-26 ENCOUNTER — Ambulatory Visit: Payer: Self-pay | Admitting: Internal Medicine

## 2009-03-04 ENCOUNTER — Encounter: Payer: Self-pay | Admitting: Internal Medicine

## 2009-03-04 ENCOUNTER — Encounter: Payer: Self-pay | Admitting: Cardiology

## 2009-03-04 LAB — CONVERTED CEMR LAB: Hgb A1c MFr Bld: 6.4 %

## 2009-03-10 ENCOUNTER — Ambulatory Visit: Payer: Self-pay | Admitting: Internal Medicine

## 2009-03-10 LAB — CONVERTED CEMR LAB
Cholesterol, target level: 200 mg/dL
Cholesterol: 127 mg/dL
Direct LDL: 37 mg/dL
HDL goal, serum: 40 mg/dL
HDL: 62 mg/dL
LDL Cholesterol: 62 mg/dL
LDL Goal: 100 mg/dL
TSH: 4.63 microintl units/mL
Total CHOL/HDL Ratio: 2.05
Triglycerides: 18 mg/dL

## 2009-03-26 ENCOUNTER — Ambulatory Visit: Payer: Self-pay | Admitting: Internal Medicine

## 2009-03-26 LAB — CONVERTED CEMR LAB
INR: 2
Prothrombin Time: 17.6 s

## 2009-04-04 ENCOUNTER — Encounter: Payer: Self-pay | Admitting: Cardiology

## 2009-04-07 ENCOUNTER — Ambulatory Visit: Payer: Self-pay | Admitting: Cardiology

## 2009-04-23 ENCOUNTER — Ambulatory Visit: Payer: Self-pay | Admitting: Internal Medicine

## 2009-04-23 LAB — CONVERTED CEMR LAB
INR: 1.3
Prothrombin Time: 13.9 s

## 2009-05-07 ENCOUNTER — Ambulatory Visit: Payer: Self-pay | Admitting: Internal Medicine

## 2009-05-07 LAB — CONVERTED CEMR LAB
INR: 4
Prothrombin Time: 24.2 s

## 2009-05-28 ENCOUNTER — Ambulatory Visit: Payer: Self-pay | Admitting: Internal Medicine

## 2009-05-28 LAB — CONVERTED CEMR LAB
INR: 3.4
Prothrombin Time: 22.3 s

## 2009-06-13 ENCOUNTER — Encounter: Payer: Self-pay | Admitting: Internal Medicine

## 2009-06-13 ENCOUNTER — Encounter: Payer: Self-pay | Admitting: Cardiology

## 2009-06-13 LAB — CONVERTED CEMR LAB
Cholesterol: 159 mg/dL
HDL: 70 mg/dL
Hgb A1c MFr Bld: 6.9 %
LDL Cholesterol: 81 mg/dL
Triglycerides: 131 mg/dL

## 2009-06-18 ENCOUNTER — Ambulatory Visit: Payer: Self-pay | Admitting: Internal Medicine

## 2009-06-18 LAB — CONVERTED CEMR LAB
INR: 3.2
Prothrombin Time: 21.6 s

## 2009-07-18 ENCOUNTER — Ambulatory Visit: Payer: Self-pay | Admitting: Internal Medicine

## 2009-07-18 LAB — CONVERTED CEMR LAB
INR: 0.9
Prothrombin Time: 11.9 s

## 2009-08-08 ENCOUNTER — Ambulatory Visit: Payer: Self-pay | Admitting: Internal Medicine

## 2009-08-08 LAB — CONVERTED CEMR LAB
INR: 2.8
Prothrombin Time: 20.2 s

## 2009-09-03 ENCOUNTER — Encounter: Admission: RE | Admit: 2009-09-03 | Discharge: 2009-09-03 | Payer: Self-pay | Admitting: General Surgery

## 2009-09-03 ENCOUNTER — Ambulatory Visit: Payer: Self-pay | Admitting: Internal Medicine

## 2009-09-03 LAB — CONVERTED CEMR LAB
INR: 2.5
Prothrombin Time: 19.2 s

## 2009-09-11 ENCOUNTER — Encounter: Payer: Self-pay | Admitting: Internal Medicine

## 2009-09-11 ENCOUNTER — Encounter: Payer: Self-pay | Admitting: Cardiology

## 2009-09-11 LAB — CONVERTED CEMR LAB
Cholesterol: 186 mg/dL
HDL: 59 mg/dL
Hgb A1c MFr Bld: 6.8 %
LDL Cholesterol: 111 mg/dL
Triglycerides: 214 mg/dL

## 2009-09-17 ENCOUNTER — Ambulatory Visit: Payer: Self-pay | Admitting: Internal Medicine

## 2009-10-01 ENCOUNTER — Ambulatory Visit: Payer: Self-pay | Admitting: Internal Medicine

## 2009-10-01 LAB — CONVERTED CEMR LAB
INR: 2.7
Prothrombin Time: 19.8 s

## 2009-10-07 ENCOUNTER — Ambulatory Visit (HOSPITAL_COMMUNITY): Payer: Self-pay | Admitting: Oncology

## 2009-10-07 ENCOUNTER — Encounter: Payer: Self-pay | Admitting: Internal Medicine

## 2009-10-07 ENCOUNTER — Encounter: Payer: Self-pay | Admitting: Cardiology

## 2009-10-07 ENCOUNTER — Encounter (HOSPITAL_COMMUNITY): Admission: RE | Admit: 2009-10-07 | Discharge: 2009-11-06 | Payer: Self-pay | Admitting: Oncology

## 2009-10-22 ENCOUNTER — Encounter: Admission: RE | Admit: 2009-10-22 | Discharge: 2009-10-22 | Payer: Self-pay | Admitting: Oncology

## 2009-10-28 ENCOUNTER — Ambulatory Visit: Payer: Self-pay | Admitting: Internal Medicine

## 2009-10-28 LAB — CONVERTED CEMR LAB: INR: 1.6

## 2009-11-13 ENCOUNTER — Telehealth: Payer: Self-pay | Admitting: Internal Medicine

## 2009-12-01 ENCOUNTER — Ambulatory Visit: Payer: Self-pay | Admitting: Internal Medicine

## 2009-12-01 LAB — CONVERTED CEMR LAB: INR: 2.9

## 2009-12-16 ENCOUNTER — Encounter: Payer: Self-pay | Admitting: Cardiology

## 2009-12-16 ENCOUNTER — Encounter: Payer: Self-pay | Admitting: Internal Medicine

## 2009-12-17 ENCOUNTER — Ambulatory Visit: Payer: Self-pay | Admitting: Internal Medicine

## 2009-12-29 ENCOUNTER — Ambulatory Visit: Payer: Self-pay | Admitting: Internal Medicine

## 2009-12-29 LAB — CONVERTED CEMR LAB: INR: 3.1

## 2010-01-29 ENCOUNTER — Ambulatory Visit: Payer: Self-pay | Admitting: Internal Medicine

## 2010-01-29 LAB — CONVERTED CEMR LAB: INR: 1.9

## 2010-02-13 ENCOUNTER — Ambulatory Visit: Payer: Self-pay | Admitting: Family Medicine

## 2010-02-27 ENCOUNTER — Ambulatory Visit: Payer: Self-pay | Admitting: Internal Medicine

## 2010-02-27 LAB — CONVERTED CEMR LAB: INR: 3.1

## 2010-03-19 ENCOUNTER — Encounter: Payer: Self-pay | Admitting: Cardiology

## 2010-03-23 ENCOUNTER — Ambulatory Visit: Payer: Self-pay | Admitting: Internal Medicine

## 2010-03-23 LAB — CONVERTED CEMR LAB
Cholesterol: 156 mg/dL
Creatinine, Ser: 0.07 mg/dL
HDL: 67 mg/dL
Hgb A1c MFr Bld: 6.7 %
LDL Cholesterol: 81 mg/dL
TSH: 4.38 microintl units/mL

## 2010-04-08 ENCOUNTER — Ambulatory Visit: Payer: Self-pay | Admitting: Cardiology

## 2010-04-20 ENCOUNTER — Ambulatory Visit: Payer: Self-pay | Admitting: Internal Medicine

## 2010-04-20 LAB — CONVERTED CEMR LAB: INR: 2.6

## 2010-04-22 ENCOUNTER — Encounter: Payer: Self-pay | Admitting: Internal Medicine

## 2010-05-27 ENCOUNTER — Ambulatory Visit
Admission: RE | Admit: 2010-05-27 | Discharge: 2010-05-27 | Payer: Self-pay | Source: Home / Self Care | Attending: Internal Medicine | Admitting: Internal Medicine

## 2010-05-27 LAB — CONVERTED CEMR LAB: INR: 2.5

## 2010-06-13 ENCOUNTER — Encounter (HOSPITAL_COMMUNITY): Payer: Self-pay | Admitting: Oncology

## 2010-06-14 ENCOUNTER — Encounter: Payer: Self-pay | Admitting: General Surgery

## 2010-06-15 ENCOUNTER — Ambulatory Visit
Admission: RE | Admit: 2010-06-15 | Discharge: 2010-06-15 | Payer: Self-pay | Source: Home / Self Care | Attending: Internal Medicine | Admitting: Internal Medicine

## 2010-06-15 DIAGNOSIS — M4120 Other idiopathic scoliosis, site unspecified: Secondary | ICD-10-CM | POA: Insufficient documentation

## 2010-06-15 DIAGNOSIS — F411 Generalized anxiety disorder: Secondary | ICD-10-CM | POA: Insufficient documentation

## 2010-06-15 LAB — HM DIABETES FOOT EXAM

## 2010-06-23 ENCOUNTER — Ambulatory Visit
Admission: RE | Admit: 2010-06-23 | Discharge: 2010-06-23 | Payer: Self-pay | Source: Home / Self Care | Attending: Internal Medicine | Admitting: Internal Medicine

## 2010-06-23 NOTE — Assessment & Plan Note (Signed)
Summary: 2 MONTH ROV/NJR   Vital Signs:  Patient Profile:   77 Years Old Female Height:     64 inches Weight:      165 pounds Temp:     98.5 degrees F oral Pulse rate:   80 / minute Resp:     14 per minute BP sitting:   140 / 80  (left arm)  Vitals Entered By: Willy Eddy, LPN (January 09, 2008 10:14 AM)                 Chief Complaint:  roa- zanax helped some with sleep and but wakes up after a couple of hours and cant go back to sleep.  History of Present Illness: Current Problems:  insomnia is new problem URI (ICD-465.9) OSTEOPOROSIS (ICD-733.00) OSTEOARTHRITIS (ICD-715.90) HYPERTENSION (ICD-401.9) DIABETES MELLITUS, TYPE II (ICD-250.00) ATRIAL FIBRILLATION (ICD-427.31) OSTEOPOROSIS (ICD-733.00) HYPERTENSION (ICD-401.9) DIABETES MELLITUS, TYPE II (ICD-250.00) ATRIAL FIBRILLATION (ICD-427.31) COUMADIN THERAPY (ICD-V58.61) ENCOUNTER FOR THERAPEUTIC DRUG MONITORING (ICD-V58.83)    Follow-Up Visit      This is an 77 year old woman who presents for Follow-up visit.  The patient denies chest pain, palpitations, dizziness, syncope, low blood sugar symptoms, high blood sugar symptoms, edema, SOB, DOE, PND, and orthopnea.  Since the last visit the patient notes no new problems or concerns.  The patient reports not taking meds as prescribed.  When questioned about possible medication side effects, the patient notes cramping.    Hypertension History:      She denies headache, chest pain, palpitations, dyspnea with exertion, orthopnea, PND, peripheral edema, visual symptoms, neurologic problems, syncope, and side effects from treatment.        Positive major cardiovascular risk factors include female age 75 years old or older, diabetes, and hypertension.  Negative major cardiovascular risk factors include negative family history for ischemic heart disease and non-tobacco-user status.       Current Allergies: ! CODEINE CODEINE SULFATE (CODEINE SULFATE) CODEINE SULFATE  (CODEINE SULFATE) CODEINE SULFATE (CODEINE SULFATE)  Past Medical History:    Reviewed history from 05/08/2007 and no changes required:       Atrial fibrillation       Diabetes mellitus, type II       Hypertension       Osteoarthritis       Osteoporosis  Past Surgical History:    Reviewed history from 05/08/2007 and no changes required:       Hysterectomy       Mastectomy       shoulder dislocation          Family History:    Reviewed history from 05/08/2007 and no changes required:       mother CHF  at 68       father MI at 8  Social History:    Reviewed history from 05/08/2007 and no changes required:       Married       Never Smoked       Alcohol use-no       Drug use-no       Regular exercise-yes    Review of Systems  The patient denies anorexia, fever, weight loss, weight gain, vision loss, decreased hearing, hoarseness, chest pain, syncope, dyspnea on exertion, peripheral edema, prolonged cough, headaches, hemoptysis, abdominal pain, melena, hematochezia, severe indigestion/heartburn, hematuria, incontinence, genital sores, muscle weakness, suspicious skin lesions, transient blindness, difficulty walking, depression, unusual weight change, abnormal bleeding, enlarged lymph nodes, angioedema, and breast masses.     Physical  Exam  General:     Well-developed,well-nourished,in no acute distress; alert,appropriate and cooperative throughout examination Head:     normocephalic.   Nose:     nasal dischargemucosal pallor and mucosal edema.   Mouth:     pharyngeal erythema, pharyngeal exudate, and posterior lymphoid hypertrophy.   Neck:     supple.   Lungs:     Normal respiratory effort, chest expands symmetrically. Lungs are clear to auscultation, no crackles or wheezes. Heart:     no murmur, irregular rhythm, and Grade   2/6 systolic ejection murmur.   Abdomen:     Bowel sounds positive,abdomen soft and non-tender without masses, organomegaly or hernias  noted. Msk:     decreased ROM, joint tenderness, and joint swelling.      Impression & Recommendations:  Problem # 1:  HYPERTENSION (ICD-401.9)  Her updated medication list for this problem includes:    Micardis Hct 40-12.5 Mg Tabs (Telmisartan-hctz) .[name]... 1/2 once daily    Cardizem Cd 240 Mg Cp24 (Diltiazem hcl coated beads) ..... Once daily    Metoprolol Tartrate 50 Mg Tabs (Metoprolol tartrate) .[name]..[name] Two times a day  BP today: 140/80 Prior BP: 140/80 (11/07/2007)  10 Yr Risk Heart Disease: Not enough information  Labs Reviewed: Creat: 0.7 (01/02/2008) Chol: 206 (01/02/2008)   HDL: 47.4 (01/02/2008)   LDL: DEL (01/02/2008)   TG: 188 (01/02/2008)   Problem # 2:  DIABETES MELLITUS, TYPE II (ICD-250.00)  The following medications were removed from the medication list:    Amaryl 2 Mg Tabs (Glimepiride) .[name]... Take one (1) by mouth once a day  Her updated medication list for this problem includes:    Micardis Hct 40-12.5 Mg Tabs (Telmisartan-hctz) .[name]... 1/2 once daily    Metformin Hcl 500 Mg Tb24 (Metformin hcl) ..... One by mouth daily  ( change to the er)    Amaryl 4 Mg Tabs (Glimepiride) .[name]... Take one (1) by mouth once a day  Labs Reviewed: HgBA1c: 8.2 (01/02/2008)   Creat: 0.7 (01/02/2008)      Problem # 3:  COUMADIN THERAPY (ICD-V58.61) PROTIME LAST WEEK  Problem # 4:  ATRIAL FIBRILLATION (ICD-427.31)  Her updated medication list for this problem includes:    Metoprolol Tartrate 50 Mg Tabs (Metoprolol tartrate) .[name]..[name] Two times a day    Warfarin Sodium 5 Mg Tabs (Warfarin sodium) .[name]... 5,7 1/2    Digoxin 0.125 Mg Tabs (Digoxin) .[name]... Take 1 by mouth qd  Reviewed the following: PT: 17.5 (11/07/2007)   INR: 2.0 (11/07/2007) Next Protime: 4weeks (dated on 11/07/2007)  Orders: TLB-TSH (Thyroid Stimulating Hormone) (84443-TSH) Venipuncture (04540)   Complete Medication List: 1)  Evista 60 Mg Tabs (Raloxifene hcl) .... Once daily 2)  Micardis Hct 40-12.5 Mg Tabs  (Telmisartan-hctz) .... 1/2 once daily 3)  Cardizem Cd 240 Mg Cp24 (Diltiazem hcl coated beads) .... Once daily 4)  Klor-con 8 Meq Tbcr (Potassium chloride) .... Once daily 5)  Oscal 500/200 D-3 500-200 Mg-unit Tabs (Calcium-vitamin d) .[name].. 1650 total; 6)  Magnesium Oxide 400 Mg Caps (Magnesium oxide) .... 2 once daily 7)  Reclast 5 Mg/188ml Soln (Zoledronic acid) .... Yearly 8)  Metformin Hcl 500 Mg Tb24 (Metformin hcl) .... One by mouth daily  ( change to the er) 9)  Metoprolol Tartrate 50 Mg Tabs (Metoprolol tartrate) .... Two times a day 10)  Warfarin Sodium 5 Mg Tabs (Warfarin sodium) .... 5,7 1/2 11)  Digoxin 0.125 Mg Tabs (Digoxin) .... Take 1 by mouth qd 12)  Alprazolam 0.25  Mg Tabs (Alprazolam) .... One by mouth q hs 13)  Truetest Test Strp (Glucose blood) .[name].. 1 once daily 14)  Amaryl 4 Mg Tabs (Glimepiride) .... Take one (1) by mouth once a day  Other Orders: TLB-Calcium (82310-CA) T-Vitamin D (25-Hydroxy) (69629-52841) TLB-Magnesium (Mg) (83735-MG)  Hypertension Assessment/Plan:      The patient's hypertensive risk group is category C: Target organ damage and/or diabetes.  Today's blood pressure is 140/80.  Her blood pressure goal is < 130/80.   Patient Instructions: 1)  Please schedule a follow-up appointment in 2 months. 2)  HbgA1C prior to visit, ICD-9:  250.02   Prescriptions: AMARYL 4 MG TABS (GLIMEPIRIDE) Take one (1) by mouth once a day  #30 x 11   Entered and Authorized by:   Stacie Glaze MD   Signed by:   Stacie Glaze MD on 01/09/2008   Method used:   Electronically sent to ...       [facility]  [phone]*       [address]       [place], [place]  [address]       Ph: [phone] or [phone]       Fax: [phone]   RxID:   [id number] TRUETEST TEST   STRP (GLUCOSE BLOOD) 1 once daily  #100 x 3   Entered by:   Willy Eddy, LPN   Authorized by:   Stacie Glaze MD   Signed by:   Willy Eddy, LPN on 23/55/7322    Method used:   Electronically sent to ...       [facility]  [phone]*       [address]       [place], [place]  [address]       Ph: [phone] or [phone]       Fax: [phone]   RxID:   [id number] DIGOXIN 0.125 MG  TABS (DIGOXIN) take 1 by mouth qd  #90 x 3   Entered by:   Willy Eddy, LPN   Authorized by:   Stacie Glaze MD   Signed by:   Willy Eddy, LPN on 69/67/8938   Method used:   Electronically sent to ...       [facility]  [phone]*       [address]       [place], [place]  [address]       Ph: [phone] or [phone]       Fax: [phone]   RxID:   [id number]  ]

## 2010-06-23 NOTE — Letter (Signed)
Summary: [facility] Progress Note   [facility] Progress Note   Imported By: Roderic Ovens 04/03/2010 16:15:26  _____________________________________________________________________  External Attachment:    Type:   Image     Comment:   External Document

## 2010-06-23 NOTE — Progress Notes (Signed)
----   Converted from flag ---- ---- 04/10/2007 8:23 AM, Trixie Dredge wrote: I need some clarification please.  Did this patient get her flu shot on 03/10/07 when she was here for her counmadin check, because the date of service on the order is 04/07/07?  Let me know please.  Thanks ------------------------------

## 2010-06-23 NOTE — Assessment & Plan Note (Signed)
Summary: pt/njr   Nurse Visit   Allergies: 1)  ! Codeine 2)  Codeine Sulfate (Codeine Sulfate) 3)  Codeine Sulfate (Codeine Sulfate) 4)  Codeine Sulfate (Codeine Sulfate) Laboratory Results   Blood Tests      INR: 3.1   (Normal Range: 0.88-1.12   Therap INR: 2.0-3.5) Comments: Rita Ohara  December 29, 2009 2:05 PM      ANTICOAGULATION RECORD PREVIOUS REGIMEN & LAB RESULTS Anticoagulation Diagnosis:  v58.83,v58.61,427.31 on  05/31/2008 Previous INR Goal Range:  2.0-3.0 on  05/31/2008 Previous INR:  2.9 on  12/01/2009 Previous Coumadin Dose(mg):  7.5mg ,10mg  alt. on  08/08/2009 Previous Regimen:  same on  12/01/2009 Previous Coagulation Comments:  Dr. Kirtland Bouchard approved. on  01/31/2009  NEW REGIMEN & LAB RESULTS Current INR: 3.1 Regimen: same  Repeat testing in: 4 weeks  Anticoagulation Visit Questionnaire Coumadin dose missed/changed:  No Abnormal Bleeding Symptoms:  No  Any diet changes including alcohol intake, vegetables or greens since the last visit:  No Any illnesses or hospitalizations since the last visit:  No Any signs of clotting since the last visit (including chest discomfort, dizziness, shortness of breath, arm tingling, slurred speech, swelling or redness in leg):  No  MEDICATIONS EVISTA 60 MG  TABS (RALOXIFENE HCL) once daily LOSARTAN POTASSIUM 100 MG TABS (LOSARTAN POTASSIUM) one by mouth daily CARDIZEM CD 240 MG  CP24 (DILTIAZEM HCL COATED BEADS) once daily KLOR-CON 8 MEQ  TBCR (POTASSIUM CHLORIDE) once daily CALTRATE 600+D 600-400 MG-UNIT TABS (CALCIUM CARBONATE-VITAMIN D) 1 two times a day MAGNESIUM 250 MG TABS (MAGNESIUM) three times a day RECLAST 5 MG/100ML  SOLN (ZOLEDRONIC ACID) yearly WARFARIN SODIUM 5 MG TABS (WARFARIN SODIUM) 7.5, daily DIGOXIN 0.125 MG  TABS (DIGOXIN) take 1 by mouth qd ALPRAZOLAM 0.25 MG  TABS (ALPRAZOLAM) one by mouth q HS TRUETEST TEST   STRP (GLUCOSE BLOOD) two times a day KOMBIGLYZE XR 2.09-998 MG XR24H-TAB  (SAXAGLIPTIN-METFORMIN) one by mouth two times a day ACTOS 45 MG TABS (PIOGLITAZONE HCL) 1 once daily CRESTOR 5 MG TABS (ROSUVASTATIN CALCIUM) 1 by mouth  MWF weekly MULTIVITAMINS  TABS (MULTIPLE VITAMIN) 1 once daily VITAMIN C CR 500 MG CR-CAPS (ASCORBIC ACID) 1 once daily VITAMIN D 2000 UNIT TABS (CHOLECALCIFEROL) 1 once daily B COMPLEX-B12  TABS (B COMPLEX VITAMINS) 1 once daily METOPROLOL TARTRATE 50 MG TABS (METOPROLOL TARTRATE) 1 two times a day

## 2010-06-23 NOTE — Assessment & Plan Note (Signed)
Summary: cough/congestion/njr   Vital Signs:  Patient Profile:   77 Years Old Female Height:     64 inches Weight:      172 pounds Temp:     99.0 degrees F oral BP sitting:   124 / 66  (right arm) Cuff size:   regular  Vitals Entered By: Alfred Levins, CMA (June 02, 2007 1:23 PM)                 Chief Complaint:  runny nose and achy x 5 days.  History of Present Illness: URI symptoms with congeston runny nose, no fever and clear discharge... sever myalgias and cough... sneezing  Current Allergies: ! CODEINE CODEINE SULFATE (CODEINE SULFATE) CODEINE SULFATE (CODEINE SULFATE) CODEINE SULFATE (CODEINE SULFATE)      Physical Exam  General:     Well-developed,well-nourished,in no acute distress; alert,appropriate and cooperative throughout examination Ears:     External ear exam shows no significant lesions or deformities.  Otoscopic examination reveals clear canals, tympanic membranes are intact bilaterally without bulging, retraction, inflammation or discharge. Hearing is grossly normal bilaterally. Nose:     nasal dischargemucosal pallor and mucosal edema.   Mouth:     pharyngeal erythema, pharyngeal exudate, and posterior lymphoid hypertrophy.      Impression & Recommendations:  Problem # 1:  URI (ICD-465.9) Instructed on symptomatic treatment. Call if symptoms persist or worsen.  allerX and a z pack to hold in case sputum changes color  Problem # 2:  HYPERTENSION (ICD-401.9)  Her updated medication list for this problem includes:    Hydrochlorothiazide 25 Mg Tabs (Hydrochlorothiazide) .[name]... Take 1/2 tablet by mouth every morning    Metoprolol Succinate 50 Mg Tb24 (Metoprolol succinate) ..... Once daily    Micardis Hct 40-12.5 Mg Tabs (Telmisartan-hctz) .[name]... 1/2 once daily    Cardizem Cd 240 Mg Cp24 (Diltiazem hcl coated beads) ..... Once daily    Metoprolol Tartrate 50 Mg Tabs (Metoprolol tartrate)    Micardis 20 Mg Tabs (Telmisartan) .[name]... Take 1/2  tab by mouth qd    Cardizem Cd 240 Mg Cp24 (Diltiazem hcl coated beads) .[name]... Take 1 by mouth qd  BP today: 124/66 Prior BP: 122/80 (05/08/2007)  Labs Reviewed: Creat: 0.7 (05/08/2007)   Complete Medication List: 1)  Glimepiride 1 Mg Tabs (Glimepiride) .... Once daily 2)  Hydrochlorothiazide 25 Mg Tabs (Hydrochlorothiazide) .... Take 1/2 tablet by mouth every morning 3)  Warfarin Sodium 5 Mg Tabs (Warfarin sodium) .... 5,10. 4)  Evista 60 Mg Tabs (Raloxifene hcl) .... Once daily 5)  Metoprolol Succinate 50 Mg Tb24 (Metoprolol succinate) .... Once daily 6)  Micardis Hct 40-12.5 Mg Tabs (Telmisartan-hctz) .... 1/2 once daily 7)  Cardizem Cd 240 Mg Cp24 (Diltiazem hcl coated beads) .... Once daily 8)  Klor-con 8 Meq Tbcr (Potassium chloride) .... Once daily 9)  Oscal 500/200 D-3 500-200 Mg-unit Tabs (Calcium-vitamin d) .[name].. 1650 total; 10)  Magnesium Oxide 400 Mg Caps (Magnesium oxide) .... 2 once daily 11)  Reclast 5 Mg/150ml Soln (Zoledronic acid) .... Yearly 12)  Metformin Hcl 500 Mg Tabs (Metformin hcl) .... Once daily 13)  Evista 60 Mg Tabs (Raloxifene hcl) 14)  Metoprolol Tartrate 50 Mg Tabs (Metoprolol tartrate) 15)  Warfarin Sodium 5 Mg Tabs (Warfarin sodium) 16)  Digoxin 0.125 Mg Tabs (Digoxin) .... Take 1 by mouth qd 17)  Metformin Hcl 500 Mg Tb24 (Metformin hcl) .... Take 1 by mouth qd 18)  Micardis 20 Mg Tabs (Telmisartan) .... Take 1/2 tab by mouth  qd 19)  Warfarin Sodium 5 Mg Tabs (Warfarin sodium) .... Take as directed 20)  Cardizem Cd 240 Mg Cp24 (Diltiazem hcl coated beads) .... Take 1 by mouth qd 21)  Zithromax 250 Mg Tab (Azithromycin) .... Take two (2 ) tablets day one, then one (1) tablet a day for four (4) more days     Prescriptions: ZITHROMAX 250 MG TAB (AZITHROMYCIN) Take two (2 ) tablets day one, then one (1) tablet a day for four (4) more days  #6 x 0   Entered and Authorized by:   Stacie Glaze MD   Signed by:   Stacie Glaze MD on 06/02/2007    Method used:   Electronically sent to ...       [facility]  [phone]*       [address]       [place], [place]  [address]       Ph: [phone] or [phone]       Fax: [phone]   RxID:   [id number]  ]

## 2010-06-23 NOTE — Assessment & Plan Note (Signed)
Summary: pt/njr  Nurse Visit   Vital Signs:  Patient Profile:   77 Years Old Female Pulse rate:   59 / minute BP sitting:   106 / 48  (left arm)                  Laboratory Results   Blood Tests     PT: 17.3 s   (Normal Range: 10.6-13.4)  INR: 2.0   (Normal Range: 0.88-1.12   Therap INR: 2.0-3.5) Comments: Rita Ohara  May 03, 2008 3:29 PM       Orders Added: 1)  Est. Patient Level I [99211] 2)  Fingerstick [36416] 3)  Protime Ila.[email or web address]    ]  Vital Signs:  Patient Profile:   77 Years Old Female Pulse rate:   59 / minute BP sitting:   106 / 48                   ANTICOAGULATION RECORD PREVIOUS REGIMEN & LAB RESULTS   Previous INR:  1.7 on  04/05/2008  Previous Regimen:  same on  04/05/2008  NEW REGIMEN & LAB RESULTS Current INR: 2.0 Regimen: same  (no change)  Repeat testing in: 4 weeks  Anticoagulation Visit Questionnaire Coumadin dose missed/changed:  No Abnormal Bleeding Symptoms:  No  Any diet changes including alcohol intake, vegetables or greens since the last visit:  No Any illnesses or hospitalizations since the last visit:  No Any signs of clotting since the last visit (including chest discomfort, dizziness, shortness of breath, arm tingling, slurred speech, swelling or redness in leg):  No

## 2010-06-23 NOTE — Progress Notes (Signed)
Summary: needs more samples   Phone Note Call from Patient Call back at Home Phone [phone]   Caller: pt live Call For: jenkins Summary of Call: onglyza 5 mg.  she needs more samples  Initial call taken by: Roselle Locus,  November 15, 2008 11:43 AM  Follow-up for Phone Call        done Follow-up by: Willy Eddy, LPN,  November 15, 2008 3:17 PM

## 2010-06-23 NOTE — Assessment & Plan Note (Signed)
Summary: Z6X      Allergies Added:   Visit Type:  1 year follow up Primary Provider:  Stacie Glaze MD  CC:  atrial fibrillation.  History of Present Illness: The patient returns for followup of atrial fibrillation.  She has an episode 3 or 4 times a year.  It lasts less than 24 hours.  She does not have chest pain or shortness of breath but she does not feel particularly well with it.  There is no syncope or presyncope.  She is on Coumadin.  Current Medications (verified): 1)  Evista 60 Mg  Tabs (Raloxifene Hcl) .... Once Daily 2)  Losartan Potassium-Hctz 100-12.5 Mg Tabs (Losartan Potassium-Hctz) .... One By Mouth Daily 3)  Cardizem Cd 240 Mg  Cp24 (Diltiazem Hcl Coated Beads) .... Once Daily 4)  Klor-Con 8 Meq  Tbcr (Potassium Chloride) .... Once Daily 5)  Caltrate 600+d 600-400 Mg-Unit Tabs (Calcium Carbonate-Vitamin D) .[name].. 1 Two Times A Day 6)  Magnesium 250 Mg Tabs (Magnesium) .... Three Times A Day 7)  Reclast 5 Mg/143ml  Soln (Zoledronic Acid) .... Yearly 8)  Warfarin Sodium 5 Mg Tabs (Warfarin Sodium) .... 7.5, Daily 9)  Digoxin 0.125 Mg  Tabs (Digoxin) .... Take 1 By Mouth Qd 10)  Alprazolam 0.25 Mg  Tabs (Alprazolam) .... One By Mouth Q Hs 11)  Truetest Test   Strp (Glucose Blood) .... Two Times A Day 12)  Kombiglyze Xr 2.09-998 Mg Xr24h-Tab (Saxagliptin-Metformin) .... One By Mouth Two Times A Day 13)  Actos 45 Mg Tabs (Pioglitazone Hcl) .[name].. 1 Once Daily 14)  Crestor 5 Mg Tabs (Rosuvastatin Calcium) .[name].. 1 By Mouth  Mwf Weekly 15)  Multivitamins  Tabs (Multiple Vitamin) .[name].. 1 Once Daily 16)  Vitamin C Cr 500 Mg Cr-Caps (Ascorbic Acid) .[name].. 1 Once Daily 17)  Vitamin D 2000 Unit Tabs (Cholecalciferol) .[name].. 1 Once Daily 18)  B Complex-B12  Tabs (B Complex Vitamins) .[name].. 1 Once Daily 19)  Metoprolol Tartrate 50 Mg Tabs (Metoprolol Tartrate) .[name].. 1 Two Times A Day  Allergies (verified): 1)  ! Codeine 2)  Codeine Sulfate (Codeine Sulfate) 3)  Codeine Sulfate (Codeine  Sulfate) 4)  Codeine Sulfate (Codeine Sulfate)  Past History:  Past Medical History: ADENOCARCINOMA, BREAST, HX OF (ICD-V10.3) SYNCOPE, HX OF (ICD-V12.49 Nuclear stress... April, 2008... no ischemia... EF 65% EF  60%... echo.. February, 2008 Aortic valve sclerosis.... echo.... mild FRACTURE, PELVIS, LEFT, HX OF (ICD-808.8).. FRACTURE, FEMUR, RIGHT, HX OF (ICD-821.00) COUMADIN THERAPY (ICD-V58.61) ENCOUNTER FOR LONG-TERM USE OF ANTICOAGULANTS (ICD-V58.61) ATRIAL FIBRILLATION (ICD-427.31)...infrequent.. paroxysmal.. Coumadin therapy HYPERLIPIDEMIA, WITH HIGH HDL (ICD-272.4) ENCOUNTER FOR THERAPEUTIC DRUG MONITORING (ICD-V58.83) ACUTE SINUSITIS, UNSPECIFIED (ICD-461.9) URI (ICD-465.9) OSTEOPOROSIS (ICD-733.00) OSTEOARTHRITIS (ICD-715.90) HYPERTENSION (ICD-401.9) DIABETES MELLITUS, TYPE II (ICD-250.00) HYPERTENSION (ICD-401.9) DIABETES MELLITUS, TYPE II (ICD-250.00)    Review of Systems       Patient denies fever, chills, headache, sweats, rash, change in vision, change in hearing, chest pain, cough, nausea vomiting, urinary symptoms.  All other systems are reviewed and are negative.  Vital Signs:  Patient profile:   77 year old female Height:      64 inches Weight:      160.50 pounds BMI:     27.65 Pulse rate:   55 / minute BP sitting:   142 / 64  (right arm) Cuff size:   regular  Vitals Entered By: Caralee Ates CMA (April 08, 2010 11:36 AM)  Physical Exam  General:  Patient is stable. Eyes:  no xanthelasma. Neck:  no  jugular venous distention. Lungs:  lungs are clear.  Respiratory effort is unlabored. Heart:  cardiac exam reveals S1-S2.  No clicks or significant murmurs. Abdomen:  abdomen soft. Extremities:  no peripheral edema. Psych:  patient is oriented to person time and place.  Affect is normal.  She is here with her husband today.   Impression & Recommendations:  Problem # 1:  SYNCOPE, HX OF (ICD-V12.49) The patient has had no recurrent syncope.  No  further workup.  Problem # 2:  COUMADIN THERAPY (ICD-V58.61) Patient continues on Coumadin for her atrial fibrillation.  No change in therapy.  Problem # 3:  ATRIAL FIBRILLATION (ICD-427.31)  Her updated medication list for this problem includes:    Warfarin Sodium 5 Mg Tabs (Warfarin sodium) .[name]... 7.5, daily    Digoxin 0.125 Mg Tabs (Digoxin) .[name]... Take 1 by mouth qd    Metoprolol Tartrate 50 Mg Tabs (Metoprolol tartrate) .[name]... 1 two times a day The patient has very episodes of atrial fib.  I told her that she could take an extra metoprolol dose if needed.  I am not inclined to try flecainide as a pill in the pocket.  If she has increasing frequency we will consider. EKG is done today and reviewed by me.  There is sinus bradycardia with nonspecific ST-T wave changes.  There is no significant change when compared to prior tracings.  Patient is stable.  I will see her back in one year  Orders: EKG w/ Interpretation (93000)  Patient Instructions: 1)  Your physician wants you to follow-up in:  1 year.  You will receive a reminder letter in the mail two months in advance. If you don't receive a letter, please call our office to schedule the follow-up appointment.

## 2010-06-23 NOTE — Assessment & Plan Note (Signed)
Summary: pt/njr  Nurse Visit   Vital Signs:  Patient Profile:   77 Years Old Female Pulse rate:   52 / minute BP sitting:   146 / 73  (left arm)                  Laboratory Results   Blood Tests    Date/Time Reported: May 31, 2008 2:26 PM   PT: 14.5 s   (Normal Range: 10.6-13.4)  INR: 1.4   (Normal Range: 0.88-1.12   Therap INR: 2.0-3.5) Comments: Wynona Canes, CMA  May 31, 2008 2:27 PM       Orders Added: 1)  Est. Patient Level I [99211] 2)  Protime [85610QW] 3)  Fingerstick Xanthus.Diener    ] Laboratory Results   Blood Tests     PT: 14.5 s   (Normal Range: 10.6-13.4)  INR: 1.4   (Normal Range: 0.88-1.12   Therap INR: 2.0-3.5) Comments: Wynona Canes, CMA  May 31, 2008 2:27 PM       ANTICOAGULATION RECORD PREVIOUS REGIMEN & LAB RESULTS   Previous INR:  1.7 on  04/05/2008  Previous Regimen:  same on  04/05/2008  NEW REGIMEN & LAB RESULTS Anticoag. Dx: v58.83,v58.61,427.31 Current INR Goal Range: 2.0-3.0 Current INR: 1.4 Current Coumadin Dose(mg): 7.5mg  on m,w,f 5mg  other days Regimen: 5mg  on m,w,f 7.5mg  other days       Repeat testing in: 3 weeks  Anticoagulation Visit Questionnaire      Coumadin dose missed/changed:  No      Abnormal Bleeding Symptoms:  No   Any diet changes including alcohol intake, vegetables or greens since the last visit:  No Any illnesses or hospitalizations since the last visit:  No Any signs of clotting since the last visit (including chest discomfort, dizziness, shortness of breath, arm tingling, slurred speech, swelling or redness in leg):  No

## 2010-06-23 NOTE — Assessment & Plan Note (Signed)
Summary: f1y/jss  Medications Added MAGNESIUM 250 MG TABS (MAGNESIUM) three times a day METFORMIN HCL 500 MG  TB24 (METFORMIN HCL) 2 tabs two times a day        Visit Type:  Follow-up Primary Provider:  Stacie Glaze MD  CC:  atrial fibrillation.  History of Present Illness: Patient is seen for followup of atrial fibrillation, Coumadin therapy, aortic valve sclerosis, hyperlipidemia.  She is feeling well.  She's not having any chest pain or shortness of breath.  She does hope to have some work done on one of her eyelids.  After review today I made it clear to her that her Coumadin could be held for this.  She does have rare episodes of palpitations.  She knows that this is her atrial fibrillation.  She rests and the rhythm returned to normal.  She's not had any syncope or presyncope.  Cardiovascular Risk History:      Positive major cardiovascular risk factors include female age 27 years old or older, diabetes, hyperlipidemia, and hypertension.  Negative major cardiovascular risk factors include negative family history for ischemic heart disease and non-tobacco-user status.     Current Medications (verified): 1)  Evista 60 Mg  Tabs (Raloxifene Hcl) .... Once Daily 2)  Micardis 40 Mg Tabs (Telmisartan) .... One By Mouth Daily 3)  Cardizem Cd 240 Mg  Cp24 (Diltiazem Hcl Coated Beads) .... Once Daily 4)  Klor-Con 8 Meq  Tbcr (Potassium Chloride) .... Once Daily 5)  Caltrate 600+d 600-400 Mg-Unit Tabs (Calcium Carbonate-Vitamin D) .[name].. 1 Two Times A Day 6)  Magnesium 250 Mg Tabs (Magnesium) .... Three Times A Day 7)  Reclast 5 Mg/121ml  Soln (Zoledronic Acid) .... Yearly 8)  Metformin Hcl 500 Mg  Tb24 (Metformin Hcl) .... 2 Tabs Two Times A Day 9)  Warfarin Sodium 5 Mg Tabs (Warfarin Sodium) .... 7.5 For 3 Days and 5 All Others 10)  Digoxin 0.125 Mg  Tabs (Digoxin) .... Take 1 By Mouth Qd 11)  Alprazolam 0.25 Mg  Tabs (Alprazolam) .... One By Mouth Q Hs 12)  Truetest Test   Strp  (Glucose Blood) .... Two Times A Day 13)  Onglyza 5 Mg .... One By Mouth Daily 14)  Actos 45 Mg Tabs (Pioglitazone Hcl) .[name].. 1 Once Daily 15)  Crestor 5 Mg Tabs (Rosuvastatin Calcium) .[name].. 1 By Mouth  Mwf Weekly 16)  Multivitamins  Tabs (Multiple Vitamin) .[name].. 1 Once Daily 17)  Vitamin C Cr 500 Mg Cr-Caps (Ascorbic Acid) .[name].. 1 Once Daily 18)  Vitamin D 2000 Unit Tabs (Cholecalciferol) .[name].. 1 Once Daily 19)  B Complex-B12  Tabs (B Complex Vitamins) .[name].. 1 Once Daily  Allergies: 1)  ! Codeine 2)  Codeine Sulfate (Codeine Sulfate) 3)  Codeine Sulfate (Codeine Sulfate) 4)  Codeine Sulfate (Codeine Sulfate)  Past History:  Past Medical History: ADENOCARCINOMA, BREAST, HX OF (ICD-V10.3) SYNCOPE, HX OF (ICD-V12.49 Nuclear stress... April, 2008... no ischemia... EF 65% EF  60%... echo.. February, 2008 Aortic valve sclerosis.... echo.... mild FRACTURE, PELVIS, LEFT, HX OF (ICD-808.8) FRACTURE, FEMUR, RIGHT, HX OF (ICD-821.00) COUMADIN THERAPY (ICD-V58.61) ENCOUNTER FOR LONG-TERM USE OF ANTICOAGULANTS (ICD-V58.61) ATRIAL FIBRILLATION (ICD-427.31)...infrequent.. paroxysmal.. Coumadin therapy HYPERLIPIDEMIA, WITH HIGH HDL (ICD-272.4) ENCOUNTER FOR THERAPEUTIC DRUG MONITORING (ICD-V58.83) ACUTE SINUSITIS, UNSPECIFIED (ICD-461.9) URI (ICD-465.9) OSTEOPOROSIS (ICD-733.00) OSTEOARTHRITIS (ICD-715.90) HYPERTENSION (ICD-401.9) DIABETES MELLITUS, TYPE II (ICD-250.00) HYPERTENSION (ICD-401.9) DIABETES MELLITUS, TYPE II (ICD-250.00)    Review of Systems       Patient denies fever, chills, headache, sweats, rash, change in  vision, change in hearing, she does have a mild problem with her left eyelid and hopes to have surgery for this.  She denies chest pain, shortness of breath, cough, nausea vomiting, urinary symptoms, musculoskeletal problems.  All other systems are reviewed and are negative.  Vital Signs:  Patient profile:   77 year old female Height:      64 inches Weight:      160  pounds Pulse rate:   59 / minute BP sitting:   130 / 70  (left arm)  Vitals Entered By: Laurance Flatten CMA (April 07, 2009 10:56 AM)  Physical Exam  General:  Patient is quite stable in general. Head:  head is atraumatic. Eyes:  no xanthelasma. Neck:  no carotid bruits.  No jugular venous distention. Chest Wall:  no chest wall tenderness. Lungs:  lungs are clear.  Respiratory effort is not labored. Heart:  cardiac exam reveals S1-S2.  No clicks. There is a soft systolic murmur. Abdomen:  abdomen is soft. Msk:  no musculoskeletal deformities. Extremities:  no peripheral edema. Neurologic:  neurologic is grossly intact. Skin:  no skin rashes. Psych:  patient is oriented to person time and place.  Affect is normal.   Impression & Recommendations:  Problem # 1:  SYNCOPE, HX OF (ICD-V12.49) The patient has had no signs of syncope.  No further workup.  Problem # 2:  AORTIC SCLEROSIS (ICD-424.1)  The following medications were removed from the medication list:    Metoprolol Tartrate 50 Mg Tabs (Metoprolol tartrate) .[name]..[name] Two times a day Her updated medication list for this problem includes:    Micardis 40 Mg Tabs (Telmisartan) ..... One by mouth daily    Digoxin 0.125 Mg Tabs (Digoxin) .[name]... Take 1 by mouth qd  There is mild aortic valve sclerosis.  She does not need an echo followup at this time.  Problem # 3:  COUMADIN THERAPY (ICD-V58.61) The patient continues on Coumadin.  It will be safe for her to hold her Coumadin for 5 days before having a procedure to her left eyelid.  The Coumadin can then be restarted afterward.  Long and careful discussion with the patient about dabigatran.  We discussed whether she would be a candidate to use this medication instead of warfarin.  I told her that she would be a candidate.  I explained to her that the medication was new and that she would have to look into the cost.  She asked if there was a large experience in the general population.  I  told her that the meds are just been approved.  There is a large experience in the clinical trials.  At this point she wants to continue to remain on warfarin.  Problem # 4:  ATRIAL FIBRILLATION (ICD-427.31)  The following medications were removed from the medication list:    Metoprolol Tartrate 50 Mg Tabs (Metoprolol tartrate) .[name]..[name] Two times a day Her updated medication list for this problem includes:    Warfarin Sodium 5 Mg Tabs (Warfarin sodium) .[name]... 7.5 for 3 days and 5 all others    Digoxin 0.125 Mg Tabs (Digoxin) .[name]... Take 1 by mouth qd  The patient does have paroxysms of atrial fibrillation that are infrequent.  EKG is done today.  It is reviewed by me.  There is mild sinus bradycardia.  The results decreased anterior R wave progression.  We discussed the fact that she can take some extra metoprolol if she has an episode.  The episodes are short lived and  no other therapy is needed.  She does need to remain on warfarin  Orders: EKG w/ Interpretation (93000)  Cardiovascular Risk Assessment/Plan:      The patient's hypertensive risk group is category C: Target organ damage and/or diabetes.  Her calculated 10 year risk of coronary heart disease is 9 %.  Today's blood pressure is 130/70.  Her blood pressure goal is < 130/80.   Patient Instructions: 1)  Follow up in 1 year

## 2010-06-23 NOTE — Assessment & Plan Note (Signed)
Summary: pt/njr   Nurse Visit   Allergies: 1)  ! Codeine 2)  Codeine Sulfate (Codeine Sulfate) 3)  Codeine Sulfate (Codeine Sulfate) 4)  Codeine Sulfate (Codeine Sulfate) Laboratory Results   Blood Tests   Date/Time Received: January 29, 2010 2:39 PM  Date/Time Reported: January 29, 2010 2:39 PM    INR: 1.9   (Normal Range: 0.88-1.12   Therap INR: 2.0-3.5) Comments: Wynona Canes, CMA  January 29, 2010 2:39 PM      Orders Added: 1)  Est. Patient Level I [99211] 2)  Protime [42595GL]  Laboratory Results   Blood Tests      INR: 1.9   (Normal Range: 0.88-1.12   Therap INR: 2.0-3.5) Comments: Wynona Canes, CMA  January 29, 2010 2:39 PM        ANTICOAGULATION RECORD PREVIOUS REGIMEN & LAB RESULTS Anticoagulation Diagnosis:  v58.83,v58.61,427.31 on  05/31/2008 Previous INR Goal Range:  2.0-3.0 on  05/31/2008 Previous INR:  3.1 on  12/29/2009 Previous Coumadin Dose(mg):  7.5mg ,10mg  alt. on  08/08/2009 Previous Regimen:  same on  12/29/2009 Previous Coagulation Comments:  Dr. Kirtland Bouchard approved. on  01/31/2009  NEW REGIMEN & LAB RESULTS Current INR: 1.9 Regimen: same  (no change)       Repeat testing in: 4 week MEDICATIONS EVISTA 60 MG  TABS (RALOXIFENE HCL) once daily LOSARTAN POTASSIUM 100 MG TABS (LOSARTAN POTASSIUM) one by mouth daily CARDIZEM CD 240 MG  CP24 (DILTIAZEM HCL COATED BEADS) once daily KLOR-CON 8 MEQ  TBCR (POTASSIUM CHLORIDE) once daily CALTRATE 600+D 600-400 MG-UNIT TABS (CALCIUM CARBONATE-VITAMIN D) 1 two times a day MAGNESIUM 250 MG TABS (MAGNESIUM) three times a day RECLAST 5 MG/100ML  SOLN (ZOLEDRONIC ACID) yearly WARFARIN SODIUM 5 MG TABS (WARFARIN SODIUM) 7.5, daily DIGOXIN 0.125 MG  TABS (DIGOXIN) take 1 by mouth qd ALPRAZOLAM 0.25 MG  TABS (ALPRAZOLAM) one by mouth q HS TRUETEST TEST   STRP (GLUCOSE BLOOD) two times a day KOMBIGLYZE XR 2.09-998 MG XR24H-TAB (SAXAGLIPTIN-METFORMIN) one by mouth two times a day ACTOS 45 MG  TABS (PIOGLITAZONE HCL) 1 once daily CRESTOR 5 MG TABS (ROSUVASTATIN CALCIUM) 1 by mouth  MWF weekly MULTIVITAMINS  TABS (MULTIPLE VITAMIN) 1 once daily VITAMIN C CR 500 MG CR-CAPS (ASCORBIC ACID) 1 once daily VITAMIN D 2000 UNIT TABS (CHOLECALCIFEROL) 1 once daily B COMPLEX-B12  TABS (B COMPLEX VITAMINS) 1 once daily METOPROLOL TARTRATE 50 MG TABS (METOPROLOL TARTRATE) 1 two times a day   Anticoagulation Visit Questionnaire      Coumadin dose missed/changed:  No      Abnormal Bleeding Symptoms:  No   Any diet changes including alcohol intake, vegetables or greens since the last visit:  No Any illnesses or hospitalizations since the last visit:  No Any signs of clotting since the last visit (including chest discomfort, dizziness, shortness of breath, arm tingling, slurred speech, swelling or redness in leg):  No

## 2010-06-23 NOTE — Progress Notes (Signed)
Summary: more samples  Phone Note Call from Patient Call back at Home Phone [phone]   Caller: Mom Call For: dr Lovell Sheehan Summary of Call: pt is requesting more samples of onglyca 5 mg Initial call taken by: Heron Sabins,  Oct 07, 2008 4:08 PM  Follow-up for Phone Call        pt informed samples are up front waiting Follow-up by: Willy Eddy, LPN,  Oct 08, 2008 8:31 AM

## 2010-06-23 NOTE — Assessment & Plan Note (Signed)
Summary: FUP/CCM  Medications Added EVISTA 60 MG TABS (RALOXIFENE HCL)  METOPROLOL TARTRATE 50 MG TABS (METOPROLOL TARTRATE)  WARFARIN SODIUM 5 MG TABS (WARFARIN SODIUM)  DIGOXIN 0.125 MG  TABS (DIGOXIN) take 1 by mouth qd METFORMIN HCL 500 MG  TB24 (METFORMIN HCL) take 1 by mouth qd MICARDIS 20 MG  TABS (TELMISARTAN) take 1/2 tab by mouth qd WARFARIN SODIUM 5 MG  TABS (WARFARIN SODIUM) take as directed CARDIZEM CD 240 MG  CP24 (DILTIAZEM HCL COATED BEADS) take 1 by mouth qd      Allergies Added: ! CODEINE  Vital Signs:  Patient Profile:   77 Years Old Female Weight:      177.0 pounds Temp:     98.0 degrees F oral Pulse rate:   50 / minute BP sitting:   122 / 80  (right arm) Cuff size:   regular  Vitals Entered By: Orlan Leavens (May 08, 2007 10:58 AM)                 Chief Complaint:  Diabetes Management.  History of Present Illness: Follow up of medical problems Atrial fib AODM HTN   Diabetes Management History:      She is (or has been) enrolled in the "Diabetic Education Program".  She states understanding of dietary principles and is following her diet appropriately.  Sensory loss is noted.  Self foot exams are being performed.  She is checking home blood sugars.  She says that she is exercising.        Since her last visit, no infections have occurred.  She's had no treatment plan problems.  No changes have been made to her treatment plan since last visit.     Current Allergies (reviewed today): ! CODEINE Updated/Current Medications (including changes made in today's visit):  EVISTA 60 MG TABS (RALOXIFENE HCL)  METOPROLOL TARTRATE 50 MG TABS (METOPROLOL TARTRATE)  WARFARIN SODIUM 5 MG TABS (WARFARIN SODIUM)  DIGOXIN 0.125 MG  TABS (DIGOXIN) take 1 by mouth qd METFORMIN HCL 500 MG  TB24 (METFORMIN HCL) take 1 by mouth qd MICARDIS 20 MG  TABS (TELMISARTAN) take 1/2 tab by mouth qd WARFARIN SODIUM 5 MG  TABS (WARFARIN SODIUM) take as directed CARDIZEM CD  240 MG  CP24 (DILTIAZEM HCL COATED BEADS) take 1 by mouth qd   Past Medical History:    Reviewed history and no changes required:       Atrial fibrillation       Diabetes mellitus, type II       Hypertension       Osteoarthritis       Osteoporosis  Past Surgical History:    Reviewed history and no changes required:       Hysterectomy       Mastectomy       shoulder dislocation          Family History:    Reviewed history and no changes required:       mother CHF  at 32       father MI at 35  Social History:    Reviewed history and no changes required:       Married       Never Smoked       Alcohol use-no       Drug use-no       Regular exercise-yes   Risk Factors:  Tobacco use:  never Passive smoke exposure:  no Drug use:  no Alcohol use:  no Exercise:  yes  Family History Risk Factors:    Family History of MI in females < 62 years old:  no    Family History of MI in males < 77 years old:  no   Review of Systems  The patient denies anorexia, fever, weight loss, vision loss, decreased hearing, hoarseness, chest pain, syncope, dyspnea on exhertion, peripheral edema, prolonged cough, hemoptysis, abdominal pain, melena, hematochezia, severe indigestion/heartburn, hematuria, incontinence, genital sores, muscle weakness, suspicious skin lesions, transient blindness, difficulty walking, depression, unusual weight change, abnormal bleeding, enlarged lymph nodes, angioedema, and breast masses.     Physical Exam  General:     Well-developed,well-nourished,in no acute distress; alert,appropriate and cooperative throughout examination Ears:     External ear exam shows no significant lesions or deformities.  Otoscopic examination reveals clear canals, tympanic membranes are intact bilaterally without bulging, retraction, inflammation or discharge. Hearing is grossly normal bilaterally. Nose:     External nasal examination shows no deformity or inflammation. Nasal mucosa  are pink and moist without lesions or exudates. Mouth:     Oral mucosa and oropharynx without lesions or exudates.  Teeth in good repair. Lungs:     Normal respiratory effort, chest expands symmetrically. Lungs are clear to auscultation, no crackles or wheezes. Heart:     no murmur, irregular rhythm, and Grade   2/6 systolic ejection murmur.   Abdomen:     Bowel sounds positive,abdomen soft and non-tender without masses, organomegaly or hernias noted.    Impression & Recommendations:  Problem # 1:  DIABETES MELLITUS, TYPE II (ICD-250.00) AIC  6.8 Her updated medication list for this problem includes:    Metformin Hcl 500 Mg Tb24 (Metformin hcl) .[name]... Take 1 by mouth qd    Micardis 20 Mg Tabs (Telmisartan) .[name]... Take 1/2 tab by mouth qd  Labs Reviewed: Creat: 0.9 (08/24/2006)     Orders: TLB-A1C / Hgb A1C (Glycohemoglobin) (83036-A1C) TLB-BMP (Basic Metabolic Panel-BMET) (80048-METABOL)   Problem # 2:  HYPERTENSION (ICD-401.9)  Her updated medication list for this problem includes:    Metoprolol Tartrate 50 Mg Tabs (Metoprolol tartrate)    Micardis 20 Mg Tabs (Telmisartan) .[name]... Take 1/2 tab by mouth qd    Cardizem Cd 240 Mg Cp24 (Diltiazem hcl coated beads) .[name]... Take 1 by mouth qd  BP today: 122/80  Labs Reviewed: Creat: 0.9 (08/24/2006)  Orders: TLB-BMP (Basic Metabolic Panel-BMET) (80048-METABOL)   Problem # 3:  OSTEOARTHRITIS (ICD-715.90) Discussed use of medications, application of heat or cold, and exercises.   Complete Medication List: 1)  Evista 60 Mg Tabs (Raloxifene hcl) 2)  Metoprolol Tartrate 50 Mg Tabs (Metoprolol tartrate) 3)  Warfarin Sodium 5 Mg Tabs (Warfarin sodium) 4)  Digoxin 0.125 Mg Tabs (Digoxin) .... Take 1 by mouth qd 5)  Metformin Hcl 500 Mg Tb24 (Metformin hcl) .... Take 1 by mouth qd 6)  Micardis 20 Mg Tabs (Telmisartan) .... Take 1/2 tab by mouth qd 7)  Warfarin Sodium 5 Mg Tabs (Warfarin sodium) .... Take as directed 8)  Cardizem Cd  240 Mg Cp24 (Diltiazem hcl coated beads) .... Take 1 by mouth qd  Diabetes Management Assessment/Plan:      The following lipid goals have been established for the patient: Total cholesterol goal of 200; LDL cholesterol goal of 100; HDL cholesterol goal of 40; Triglyceride goal of 200.     Patient Instructions: 1)  Please schedule a follow-up appointment in 3 months.    ]  Appended Document: FUP/CCM      Current  Allergies: ! CODEINE        Complete Medication List: 1)  Evista 60 Mg Tabs (Raloxifene hcl) 2)  Metoprolol Tartrate 50 Mg Tabs (Metoprolol tartrate) 3)  Warfarin Sodium 5 Mg Tabs (Warfarin sodium) 4)  Digoxin 0.125 Mg Tabs (Digoxin) .... Take 1 by mouth qd 5)  Metformin Hcl 500 Mg Tb24 (Metformin hcl) .... Take 1 by mouth qd 6)  Micardis 20 Mg Tabs (Telmisartan) .... Take 1/2 tab by mouth qd 7)  Warfarin Sodium 5 Mg Tabs (Warfarin sodium) .... Take as directed 8)  Cardizem Cd 240 Mg Cp24 (Diltiazem hcl coated beads) .... Take 1 by mouth qd     ]

## 2010-06-23 NOTE — Assessment & Plan Note (Signed)
Summary: SORE THROAT/J PT/PS   Vital Signs:  Patient Profile:   77 Years Old Female Height:     64 inches Weight:      157 pounds Temp:     98.7 degrees F oral Pulse rate:   68 / minute Pulse rhythm:   regular BP sitting:   146 / 76  (left arm) Cuff size:   regular  Vitals Entered By: Raechel Ache, RN (April 19, 2008 9:54 AM)                 Chief Complaint:  Got sick Wed- stayed in bed 2 days- c/o head cong and R earache and productive cough with yellow phlegm. BS 134 @home ..  History of Present Illness: 3 days of sinus pressure, PND, ST, HA, and dry cough. No fever. On fluids and Tylenol.     Current Allergies (reviewed today): ! CODEINE CODEINE SULFATE (CODEINE SULFATE) CODEINE SULFATE (CODEINE SULFATE) CODEINE SULFATE (CODEINE SULFATE)  Past Medical History:    Reviewed history from 05/08/2007 and no changes required:       Atrial fibrillation       Diabetes mellitus, type II       Hypertension       Osteoarthritis       Osteoporosis     Review of Systems  The patient denies anorexia, fever, weight loss, weight gain, vision loss, decreased hearing, hoarseness, chest pain, syncope, dyspnea on exertion, peripheral edema, hemoptysis, abdominal pain, melena, hematochezia, severe indigestion/heartburn, hematuria, incontinence, genital sores, muscle weakness, suspicious skin lesions, transient blindness, difficulty walking, depression, unusual weight change, abnormal bleeding, enlarged lymph nodes, angioedema, breast masses, and testicular masses.     Physical Exam  General:     Well-developed,well-nourished,in no acute distress; alert,appropriate and cooperative throughout examination Head:     Normocephalic and atraumatic without obvious abnormalities. No apparent alopecia or balding. Eyes:     No corneal or conjunctival inflammation noted. EOMI. Perrla. Funduscopic exam benign, without hemorrhages, exudates or papilledema. Vision grossly normal. Ears:     External ear exam shows no significant lesions or deformities.  Otoscopic examination reveals clear canals, tympanic membranes are intact bilaterally without bulging, retraction, inflammation or discharge. Hearing is grossly normal bilaterally. Nose:     External nasal examination shows no deformity or inflammation. Nasal mucosa are pink and moist without lesions or exudates. Mouth:     Oral mucosa and oropharynx without lesions or exudates.  Teeth in good repair. Neck:     No deformities, masses, or tenderness noted. Lungs:     Normal respiratory effort, chest expands symmetrically. Lungs are clear to auscultation, no crackles or wheezes.    Impression & Recommendations:  Problem # 1:  ACUTE SINUSITIS, UNSPECIFIED (ICD-461.9)  Her updated medication list for this problem includes:    Zithromax Z-pak 250 Mg Tabs (Azithromycin) .[name]... As directed   Complete Medication List: 1)  Evista 60 Mg Tabs (Raloxifene hcl) .... Once daily 2)  Micardis Hct 40-12.5 Mg Tabs (Telmisartan-hctz) .... 1/2 once daily 3)  Cardizem Cd 240 Mg Cp24 (Diltiazem hcl coated beads) .... Once daily 4)  Klor-con 8 Meq Tbcr (Potassium chloride) .... Once daily 5)  Oscal 500/200 D-3 500-200 Mg-unit Tabs (Calcium-vitamin d) .[name].. 1650 total; 6)  Magnesium Oxide 400 Mg Caps (Magnesium oxide) .... 2 once daily 7)  Reclast 5 Mg/163ml Soln (Zoledronic acid) .... Yearly 8)  Metformin Hcl 500 Mg Tb24 (Metformin hcl) .... One by mouth  bid 9)  Metoprolol Tartrate 50  Mg Tabs (Metoprolol tartrate) .... Two times a day 10)  Warfarin Sodium 5 Mg Tabs (Warfarin sodium) .... 5,7 1/2 11)  Digoxin 0.125 Mg Tabs (Digoxin) .... Take 1 by mouth qd 12)  Alprazolam 0.25 Mg Tabs (Alprazolam) .... One by mouth q hs 13)  Truetest Test Strp (Glucose blood) .... Two times a day 14)  Onglyza 5 Mg  .... One by mouth daily 15)  Zithromax Z-pak 250 Mg Tabs (Azithromycin) .... As directed   Patient Instructions: 1)  Please schedule a  follow-up appointment as needed.   Prescriptions: ZITHROMAX Z-PAK 250 MG TABS (AZITHROMYCIN) as directed  #1 x 0   Entered and Authorized by:   Nelwyn Salisbury MD   Signed by:   Nelwyn Salisbury MD on 04/19/2008   Method used:   Electronically to        [facility]  [phone]* (retail)       [address] [place], [place]  [address]       Ph: [phone] or [phone]       Fax: [phone]   RxID:   [id number]  ]

## 2010-06-23 NOTE — Assessment & Plan Note (Signed)
Summary: 67monthrov/m,m   Vital Signs:  Patient Profile:   77 Years Old Female Height:     64 inches Weight:      156 pounds Temp:     98.5 degrees F oral Pulse rate:   76 / minute Resp:     14 per minute BP sitting:   122 / 76  (left arm)  Vitals Entered By: Willy Eddy, LPN (April 29, 2008 9:43 AM)                 Chief Complaint:  roa labs- started onglyza last ov-- c/o loosing weight without trying and Type 2 diabetes mellitus follow-up.  History of Present Illness: Had pressure and pain in ear form a sinus infection / cold abotu on month ago   Type 2 Diabetes Mellitus Follow-Up      This is an 77 year old woman who presents for Type 2 diabetes mellitus follow-up.  The patient denies polyuria, polydipsia, blurred vision, self managed hypoglycemia, hypoglycemia requiring help, weight loss, weight gain, and numbness of extremities.  The patient denies the following symptoms: neuropathic pain, chest pain, vomiting, orthostatic symptoms, poor wound healing, intermittent claudication, vision loss, and foot ulcer.  Since the last visit the patient reports good dietary compliance.  The patient has been measuring capillary blood glucose before breakfast.  Since the last visit, the patient reports having had eye care by a retinal specialist.      Current Allergies: ! CODEINE CODEINE SULFATE (CODEINE SULFATE) CODEINE SULFATE (CODEINE SULFATE) CODEINE SULFATE (CODEINE SULFATE)  Past Medical History:    Reviewed history from 05/08/2007 and no changes required:       Atrial fibrillation       Diabetes mellitus, type II       Hypertension       Osteoarthritis       Osteoporosis   Family History:    Reviewed history from 05/08/2007 and no changes required:       mother CHF  at 69       father MI at 30  Social History:    Reviewed history from 05/08/2007 and no changes required:       Married       Never Smoked       Alcohol use-no       Drug use-no       Regular  exercise-yes    Review of Systems       The patient complains of decreased hearing.  The patient denies anorexia, fever, weight loss, weight gain, vision loss, hoarseness, chest pain, syncope, dyspnea on exertion, peripheral edema, prolonged cough, headaches, hemoptysis, abdominal pain, melena, hematochezia, severe indigestion/heartburn, hematuria, incontinence, genital sores, muscle weakness, suspicious skin lesions, transient blindness, difficulty walking, depression, unusual weight change, abnormal bleeding, enlarged lymph nodes, angioedema, and breast masses.     Physical Exam  General:     Well-developed,well-nourished,in no acute distress; alert,appropriate and cooperative throughout examination Head:     Normocephalic and atraumatic without obvious abnormalities. No apparent alopecia or balding. Eyes:     pupils equal and pupils round.   Ears:     R ear normal and L ear normal.   Nose:     no external deformity and nasal dischargemucosal pallor.   Mouth:     pharynx pink and moist and no erythema.   Lungs:     Normal respiratory effort, chest expands symmetrically. Lungs are clear to auscultation, no crackles or wheezes. Heart:  no murmur, irregular rhythm, and Grade   2/6 systolic ejection murmur.   Abdomen:     Bowel sounds positive,abdomen soft and non-tender without masses, organomegaly or hernias noted.    Impression & Recommendations:  Problem # 1:  DIABETES MELLITUS, TYPE II (ICD-250.00) Assessment: Unchanged wanyts to go to an endocrinespecialist  for a discusion of the use of the basar insulin ( she is very resistant) Her updated medication list for this problem includes:    Micardis Hct 40-12.5 Mg Tabs (Telmisartan-hctz) .[name]... 1/2 once daily    Metformin Hcl 500 Mg Tb24 (Metformin hcl) ..... One by mouth  bid  Labs Reviewed: HgBA1c: 7.5 (04/22/2008)   Creat: 0.7 (04/22/2008)     Orders: Endocrinology Referral (Endocrine)   Problem # 2:  HYPERTENSION  (ICD-401.9) stable Her updated medication list for this problem includes:    Micardis Hct 40-12.5 Mg Tabs (Telmisartan-hctz) .[name]... 1/2 once daily    Cardizem Cd 240 Mg Cp24 (Diltiazem hcl coated beads) ..... Once daily    Metoprolol Tartrate 50 Mg Tabs (Metoprolol tartrate) .[name]..[name] Two times a day  BP today: 122/76 Prior BP: 146/76 (04/19/2008)  Prior 10 Yr Risk Heart Disease: Not enough information (01/09/2008)  Labs Reviewed: Creat: 0.7 (04/22/2008) Chol: 206 (01/02/2008)   HDL: 47.4 (01/02/2008)   LDL: 128.7 (01/02/2008)   TG: 188 (01/02/2008)   Problem # 3:  ATRIAL FIBRILLATION (ICD-427.31) Assessment: Unchanged  Her updated medication list for this problem includes:    Metoprolol Tartrate 50 Mg Tabs (Metoprolol tartrate) .[name]..[name] Two times a day    Warfarin Sodium 5 Mg Tabs (Warfarin sodium) .[name]... 5,7 1/2    Digoxin 0.125 Mg Tabs (Digoxin) .[name]... Take 1 by mouth qd  Reviewed the following: PT: 13.4 (02/02/2008)   INR: 1.2 (02/02/2008) Next Protime: 4 weeks (dated on 02/02/2008)   Complete Medication List: 1)  Evista 60 Mg Tabs (Raloxifene hcl) .... Once daily 2)  Micardis Hct 40-12.5 Mg Tabs (Telmisartan-hctz) .... 1/2 once daily 3)  Cardizem Cd 240 Mg Cp24 (Diltiazem hcl coated beads) .... Once daily 4)  Klor-con 8 Meq Tbcr (Potassium chloride) .... Once daily 5)  Oscal 500/200 D-3 500-200 Mg-unit Tabs (Calcium-vitamin d) .[name].. 1650 total; 6)  Magnesium Oxide 400 Mg Caps (Magnesium oxide) .... 2 once daily 7)  Reclast 5 Mg/150ml Soln (Zoledronic acid) .... Yearly 8)  Metformin Hcl 500 Mg Tb24 (Metformin hcl) .... One by mouth  bid 9)  Metoprolol Tartrate 50 Mg Tabs (Metoprolol tartrate) .... Two times a day 10)  Warfarin Sodium 5 Mg Tabs (Warfarin sodium) .... 5,7 1/2 11)  Digoxin 0.125 Mg Tabs (Digoxin) .... Take 1 by mouth qd 12)  Alprazolam 0.25 Mg Tabs (Alprazolam) .... One by mouth q hs 13)  Truetest Test Strp (Glucose blood) .... Two times a day 14)  Onglyza 5 Mg   .... One by mouth daily   Patient Instructions: 1)  Please schedule a follow-up appointment in 3 months.   ]

## 2010-06-23 NOTE — Assessment & Plan Note (Signed)
Summary: pt/njr   Nurse Visit   Allergies: 1)  ! Codeine 2)  Codeine Sulfate (Codeine Sulfate) 3)  Codeine Sulfate (Codeine Sulfate) 4)  Codeine Sulfate (Codeine Sulfate) Laboratory Results   Blood Tests     PT: 18.5 s   (Normal Range: 10.6-13.4)  INR: 2.3   (Normal Range: 0.88-1.12   Therap INR: 2.0-3.5) Comments: [name] CMA  December 20, 2008 4:02 PM     Orders Added: 1)  Est. Patient Level I [99211] 2)  Fingerstick [36416] 3)  Protime [[id number]]   ANTICOAGULATION RECORD PREVIOUS REGIMEN & LAB RESULTS Anticoagulation Diagnosis:  v58.83,v58.61,427.31 on  05/31/2008 Previous INR Goal Range:  2.0-3.0 on  05/31/2008 Previous INR:  2.5 on  11/22/2008 Previous Coumadin Dose(mg):  7.5mg  on m,w,f 5mg  other days on  05/31/2008 Previous Regimen:  7.5mg . 7.5mg . 10mg . ALT on  10/28/2008 Previous Coagulation Comments:  Dr. Kirtland Bouchard approved on  02/02/2008  NEW REGIMEN & LAB RESULTS Current INR: 2.3 Regimen: 7.5mg . 7.5mg . 10mg . ALT  (no change)   Anticoagulation Visit Questionnaire Coumadin dose missed/changed:  No Abnormal Bleeding Symptoms:  No  Any diet changes including alcohol intake, vegetables or greens since the last visit:  No Any illnesses or hospitalizations since the last visit:  No Any signs of clotting since the last visit (including chest discomfort, dizziness, shortness of breath, arm tingling, slurred speech, swelling or redness in leg):  No  MEDICATIONS EVISTA 60 MG  TABS (RALOXIFENE HCL) once daily MICARDIS 40 MG TABS (TELMISARTAN) one by mouth daily CARDIZEM CD 240 MG  CP24 (DILTIAZEM HCL COATED BEADS) once daily KLOR-CON 8 MEQ  TBCR (POTASSIUM CHLORIDE) once daily CALTRATE 600+D 600-400 MG-UNIT TABS (CALCIUM CARBONATE-VITAMIN D) 1 two times a day MAGNESIUM OXIDE 400 MG  CAPS (MAGNESIUM OXIDE) 2 once daily RECLAST 5 MG/100ML  SOLN (ZOLEDRONIC ACID) yearly METFORMIN HCL 500 MG  TB24 (METFORMIN HCL) 2 two times a day METOPROLOL TARTRATE 50 MG TABS  (METOPROLOL TARTRATE) two times a day WARFARIN SODIUM 5 MG TABS (WARFARIN SODIUM) 7.5 for 3 days and 5 all others DIGOXIN 0.125 MG  TABS (DIGOXIN) take 1 by mouth qd ALPRAZOLAM 0.25 MG  TABS (ALPRAZOLAM) one by mouth q HS TRUETEST TEST   STRP (GLUCOSE BLOOD) two times a day * ONGLYZA 5 MG one by mouth daily ACTOS 45 MG TABS (PIOGLITAZONE HCL) 1 once daily CRESTOR 5 MG TABS (ROSUVASTATIN CALCIUM) 1 by mouth  MWF weekly MULTIVITAMINS  TABS (MULTIPLE VITAMIN) 1 once daily VITAMIN C CR 500 MG CR-CAPS (ASCORBIC ACID) 1 once daily

## 2010-06-23 NOTE — Letter (Signed)
Summary: [facility]  [facility]   Imported By: Maryln Gottron 11/04/2009 14:13:49  _____________________________________________________________________  External Attachment:    Type:   Image     Comment:   External Document

## 2010-06-23 NOTE — Progress Notes (Signed)
Summary: REQ FOR REFILL (Evista)  Phone Note Refill Request Message from:  Patient on November 13, 2009 8:59 AM  Refills Requested: Medication #1:  EVISTA 60 MG  TABS once daily   Notes: [facility].    Initial call taken by: Debbra Riding,  November 13, 2009 8:59 AM    Prescriptions: EVISTA 60 MG  TABS (RALOXIFENE HCL) once daily  #90 x 1   Entered by:   Sid Falcon LPN   Authorized by:   Stacie Glaze MD   Signed by:   Sid Falcon LPN on 16/02/9603   Method used:   Electronically to        [facility]  [phone] (retail)       [address] [place], [place]  [address]       Ph: [phone] or [phone]       Fax: [phone]   RxID:   [id number]

## 2010-06-23 NOTE — Assessment & Plan Note (Signed)
Summary: PT/NJR   Nurse Visit   Vital Signs:  Patient Profile:   77 Years Old Female Height:     64 inches Pulse rate:   58 / minute BP sitting:   130 / 64  (right arm)                 Prior Medications: EVISTA 60 MG  TABS (RALOXIFENE HCL) once daily MICARDIS HCT 40-12.5 MG  TABS (TELMISARTAN-HCTZ) 1/2 once daily CARDIZEM CD 240 MG  CP24 (DILTIAZEM HCL COATED BEADS) once daily KLOR-CON 8 MEQ  TBCR (POTASSIUM CHLORIDE) once daily OSCAL 500/200 D-3 500-200 MG-UNIT  TABS (CALCIUM-VITAMIN D) 1650 TOTAL; MAGNESIUM OXIDE 400 MG  CAPS (MAGNESIUM OXIDE) 2 once daily RECLAST 5 MG/100ML  SOLN (ZOLEDRONIC ACID) yearly METFORMIN HCL 500 MG  TB24 (METFORMIN HCL) one by mouth daily  ( change to the ER) METOPROLOL TARTRATE 50 MG TABS (METOPROLOL TARTRATE) two times a day WARFARIN SODIUM 5 MG TABS (WARFARIN SODIUM) 5,7 1/2 DIGOXIN 0.125 MG  TABS (DIGOXIN) take 1 by mouth qd AMARYL 2 MG TABS (GLIMEPIRIDE) Take one (1) by mouth once a day ALPRAZOLAM 0.25 MG  TABS (ALPRAZOLAM) one by mouth q HS Current Allergies: ! CODEINE CODEINE SULFATE (CODEINE SULFATE) CODEINE SULFATE (CODEINE SULFATE) CODEINE SULFATE (CODEINE SULFATE) Laboratory Results   Blood Tests    Date/Time Reported: December 05, 2007 3:58 PM   PT: 17.5 s   (Normal Range: 10.6-13.4)  INR: 2.0   (Normal Range: 0.88-1.12   Therap INR: 2.0-3.5) Comments: Wynona Canes, CMA  December 05, 2007 3:58 PM       Orders Added: 1)  Est. Patient Level I [99211] 2)  Protime [[id number]] 3)  Fingerstick Xanthus.Diener    ]  ANTICOAGULATION RECORD PREVIOUS REGIMEN & LAB RESULTS Anticoagulation Diagnosis:  V58083,V58.61427.31 on  12/26/2006 Previous INR Goal Range:  2.0-3.0 on  12/26/2006 Previous INR:  2.0 on  11/07/2007 Previous Coumadin Dose(mg):  7.5 on m,w,f/5mg  others on  11/07/2007 Previous Regimen:  same on  11/07/2007  NEW REGIMEN & LAB RESULTS Current INR: 2.0 Regimen: same dose       Repeat testing in: 4  weeks MEDICATIONS EVISTA 60 MG  TABS (RALOXIFENE HCL) once daily MICARDIS HCT 40-12.5 MG  TABS (TELMISARTAN-HCTZ) 1/2 once daily CARDIZEM CD 240 MG  CP24 (DILTIAZEM HCL COATED BEADS) once daily KLOR-CON 8 MEQ  TBCR (POTASSIUM CHLORIDE) once daily OSCAL 500/200 D-3 500-200 MG-UNIT  TABS (CALCIUM-VITAMIN D) 1650 TOTAL; MAGNESIUM OXIDE 400 MG  CAPS (MAGNESIUM OXIDE) 2 once daily RECLAST 5 MG/100ML  SOLN (ZOLEDRONIC ACID) yearly METFORMIN HCL 500 MG  TB24 (METFORMIN HCL) one by mouth daily  ( change to the ER) METOPROLOL TARTRATE 50 MG TABS (METOPROLOL TARTRATE) two times a day WARFARIN SODIUM 5 MG TABS (WARFARIN SODIUM) 5,7 1/2 DIGOXIN 0.125 MG  TABS (DIGOXIN) take 1 by mouth qd AMARYL 2 MG TABS (GLIMEPIRIDE) Take one (1) by mouth once a day ALPRAZOLAM 0.25 MG  TABS (ALPRAZOLAM) one by mouth q HS   Anticoagulation Visit Questionnaire      Coumadin dose missed/changed:  No      Abnormal Bleeding Symptoms:  No   Any diet changes including alcohol intake, vegetables or greens since the last visit:  No Any illnesses or hospitalizations since the last visit:  No Any signs of clotting since the last visit (including chest discomfort, dizziness, shortness of breath, arm tingling, slurred speech, swelling or redness in leg):  No   Laboratory Results   Blood Tests  PT: 17.5 s   (Normal Range: 10.6-13.4)  INR: 2.0   (Normal Range: 0.88-1.12   Therap INR: 2.0-3.5) Comments: Wynona Canes, CMA  December 05, 2007 3:58 PM       Vital Signs:  Patient Profile:   77 Years Old Female Height:     64 inches Pulse rate:   58 / minute BP sitting:   130 / 64

## 2010-06-23 NOTE — Assessment & Plan Note (Signed)
Summary: 2 month rov/njr reschedule with patient from bump/mhf   Vital Signs:  Patient Profile:   77 Years Old Female Height:     64 inches Weight:      158 pounds Temp:     98.4 degrees F oral Pulse rate:   76 / minute Resp:     14 per minute BP sitting:   160 / 80  (left arm)  Vitals Entered By: Willy Eddy, LPN (July 15, 2008 11:37 AM)                 Chief Complaint:  roa -saw dr altheimer- increased metformin to 500 2 bid/ added actos 45 qd/ a nd crestor 5.  History of Present Illness:  Hypertension Follow-Up      This is an 77 year old woman who presents for Hypertension follow-up.  blood pressure reading at home are 120's over 80's but the reading aree higher in the endocrinologist.  The patient denies lightheadedness, urinary frequency, headaches, edema, impotence, rash, and fatigue.  The patient denies the following associated symptoms: chest pain, chest pressure, exercise intolerance, dyspnea, palpitations, syncope, leg edema, and pedal edema.  Compliance with medications (by patient report) has been near 100%.  The patient reports that dietary compliance has been excellent.  The patient reports exercising daily.      Current Allergies: ! CODEINE CODEINE SULFATE (CODEINE SULFATE) CODEINE SULFATE (CODEINE SULFATE) CODEINE SULFATE (CODEINE SULFATE)  Past Medical History:    Reviewed history from 05/08/2007 and no changes required:       Atrial fibrillation       Diabetes mellitus, type II       Hypertension       Osteoarthritis       Osteoporosis  Past Surgical History:    Reviewed history from 05/08/2007 and no changes required:       Hysterectomy       Mastectomy       shoulder dislocation          Family History:    Reviewed history from 05/08/2007 and no changes required:       mother CHF  at 63       father MI at 61  Social History:    Reviewed history from 05/08/2007 and no changes required:       Married       Never Smoked  Alcohol use-no       Drug use-no       Regular exercise-yes    Review of Systems  The patient denies anorexia, fever, weight loss, weight gain, vision loss, decreased hearing, hoarseness, chest pain, syncope, dyspnea on exertion, peripheral edema, prolonged cough, headaches, hemoptysis, abdominal pain, melena, hematochezia, severe indigestion/heartburn, hematuria, incontinence, genital sores, muscle weakness, suspicious skin lesions, transient blindness, difficulty walking, depression, unusual weight change, abnormal bleeding, enlarged lymph nodes, angioedema, and breast masses.     Physical Exam  General:     Well-developed,well-nourished,in no acute distress; alert,appropriate and cooperative throughout examination Head:     Normocephalic and atraumatic without obvious abnormalities. No apparent alopecia or balding. Eyes:     pupils equal and pupils round.   Nose:     no external deformity and nasal dischargemucosal pallor.   Mouth:     pharynx pink and moist and no erythema.   Neck:     No deformities, masses, or tenderness noted. Lungs:     Normal respiratory effort, chest expands symmetrically. Lungs are clear to auscultation, no crackles  or wheezes. Heart:     no murmur, irregular rhythm, and Grade   2/6 systolic ejection murmur.   Abdomen:     Bowel sounds positive,abdomen soft and non-tender without masses, organomegaly or hernias noted. Msk:     decreased ROM, joint tenderness, and joint swelling.   Extremities:     1+ left pedal edema and 1+ right pedal edema.   Neurologic:     alert & oriented X3, cranial nerves II-XII intact, and strength normal in all extremities.      Impression & Recommendations:  Problem # 1:  DIABETES MELLITUS, TYPE II (ICD-250.00) goal is to get a 1 c below the   Her updated medication list for this problem includes:    Micardis 40 Mg Tabs (Telmisartan) ..... One by mouth daily    Metformin Hcl 500 Mg Tb24 (Metformin hcl) .[name]... 2 two  times a day    Actos 45 Mg Tabs (Pioglitazone hcl) .[name]... 1 once daily ongylza Labs Reviewed: HgBA1c: 7.5 (04/22/2008)   Creat: 0.7 (04/22/2008)     Labs Reviewed: Creat: 0.7 (04/22/2008)    Reviewed HgBA1c results: 7.5 (04/22/2008)  8.0 (02/14/2008)  Problem # 2:  HYPERLIPIDEMIA, WITH HIGH HDL (ICD-272.4)  Her updated medication list for this problem includes:    Crestor 5 Mg Tabs (Rosuvastatin calcium) .[name]... 1 by mouth  mwf weekly  Labs Reviewed: Chol: 206 (01/02/2008)   HDL: 47.4 (01/02/2008)   LDL: 128.7 (01/02/2008)   TG: 188 (01/02/2008) SGOT: 28 (01/02/2008)   SGPT: 18 (01/02/2008)  Prior 10 Yr Risk Heart Disease: Not enough information (01/09/2008)   Problem # 3:  HYPERTENSION (ICD-401.9)  Her updated medication list for this problem includes:    Micardis 40 Mg Tabs (Telmisartan) ..... One by mouth daily    Cardizem Cd 240 Mg Cp24 (Diltiazem hcl coated beads) ..... Once daily    Metoprolol Tartrate 50 Mg Tabs (Metoprolol tartrate) .[name]..[name] Two times a day  Labs Reviewed: HgBA1c: 7.5 (04/22/2008)   Creat: 0.7 (04/22/2008)     BP today: 160/80 Prior BP: 146/73 (05/31/2008)  Prior 10 Yr Risk Heart Disease: Not enough information (01/09/2008)  Labs Reviewed: Creat: 0.7 (04/22/2008) Chol: 206 (01/02/2008)   HDL: 47.4 (01/02/2008)   LDL: 128.7 (01/02/2008)   TG: 188 (01/02/2008)   Problem # 4:  OSTEOPOROSIS (ICD-733.00)  Her updated medication list for this problem includes:    Evista 60 Mg Tabs (Raloxifene hcl) ..... Once daily    Oscal 500/200 D-3 500-200 Mg-unit Tabs (Calcium-vitamin d) .[name]... 1650 total;    Reclast 5 Mg/163ml Soln (Zoledronic acid) ..... Yearly   Complete Medication List: 1)  Evista 60 Mg Tabs (Raloxifene hcl) .... Once daily 2)  Micardis 40 Mg Tabs (Telmisartan) .... One by mouth daily 3)  Cardizem Cd 240 Mg Cp24 (Diltiazem hcl coated beads) .... Once daily 4)  Klor-con 8 Meq Tbcr (Potassium chloride) .... Once daily 5)  Oscal 500/200 D-3  500-200 Mg-unit Tabs (Calcium-vitamin d) .[name].. 1650 total; 6)  Magnesium Oxide 400 Mg Caps (Magnesium oxide) .... 2 once daily 7)  Reclast 5 Mg/142ml Soln (Zoledronic acid) .... Yearly 8)  Metformin Hcl 500 Mg Tb24 (Metformin hcl) .... 2 two times a day 9)  Metoprolol Tartrate 50 Mg Tabs (Metoprolol tartrate) .... Two times a day 10)  Warfarin Sodium 5 Mg Tabs (Warfarin sodium) .... 7.5 for 3 days and 5 all others 11)  Digoxin 0.125 Mg Tabs (Digoxin) .... Take 1 by mouth qd 12)  Alprazolam 0.25 Mg Tabs (  Alprazolam) .... One by mouth q hs 13)  Truetest Test Strp (Glucose blood) .... Two times a day 14)  Onglyza 5 Mg  .... One by mouth daily 15)  Actos 45 Mg Tabs (Pioglitazone hcl) .[name].. 1 once daily 16)  Crestor 5 Mg Tabs (Rosuvastatin calcium) .[name].. 1 by mouth  mwf weekly 17)  Multivitamins Tabs (Multiple vitamin) .[name].. 1 once daily 18)  Vitamin C Cr 500 Mg Cr-caps (Ascorbic acid) .[name].. 1 once daily   Patient Instructions: 1)  question? 2)  the a1C records  for 1 year are 6.9, 7.5, 8.0, 7.5 3)  if adding  2 gm of metformin 45 of actos and onglyza only marginally improve the A1C why not use basilar insulin and decrease the number of meds? 4)  I recommed that the crestor be given MWF  instead of daily 5)  Please schedule a follow-up appointment in 2 months.   Prescriptions: ALPRAZOLAM 0.25 MG  TABS (ALPRAZOLAM) one by mouth q HS  #30 x 6   Entered and Authorized by:   Stacie Glaze MD   Signed by:   Stacie Glaze MD on 07/15/2008   Method used:   Print then Give to Patient   RxID:   [id number]

## 2010-06-23 NOTE — Assessment & Plan Note (Signed)
Summary: PT//SLM   Nurse Visit   Allergies: 1)  ! Codeine 2)  Codeine Sulfate (Codeine Sulfate) 3)  Codeine Sulfate (Codeine Sulfate) 4)  Codeine Sulfate (Codeine Sulfate) Laboratory Results   Blood Tests     PT: 22.3 s   (Normal Range: 10.6-13.4)  INR: 3.4   (Normal Range: 0.88-1.12   Therap INR: 2.0-3.5) Comments: [name] CMA  May 28, 2009 11:48 AM     Orders Added: 1)  Est. Patient Level I [99211] 2)  Protime [[id number]]   ANTICOAGULATION RECORD PREVIOUS REGIMEN & LAB RESULTS Anticoagulation Diagnosis:  v58.83,v58.61,427.31 on  05/31/2008 Previous INR Goal Range:  2.0-3.0 on  05/31/2008 Previous INR:  4.0 on  05/07/2009 Previous Coumadin Dose(mg):  7.5mg  on m,w,f 5mg  other days on  05/31/2008 Previous Regimen:  Hold today and 7.5mg , 7.5mg , 10mg  alternating and RTO 3 wks. on  05/07/2009 Previous Coagulation Comments:  Dr. Kirtland Bouchard approved. on  01/31/2009  NEW REGIMEN & LAB RESULTS Current INR: 3.4 Regimen: Pt was taking previous regimen of 7.5mg  , 10mg  alternating , informed and wrote directions to take 7.5mg , 7.5mg , 10mg  alternating then RTO 3 wks.   Anticoagulation Visit Questionnaire Coumadin dose missed/changed:  No Abnormal Bleeding Symptoms:  No  Any diet changes including alcohol intake, vegetables or greens since the last visit:  No Any illnesses or hospitalizations since the last visit:  No Any signs of clotting since the last visit (including chest discomfort, dizziness, shortness of breath, arm tingling, slurred speech, swelling or redness in leg):  No  MEDICATIONS EVISTA 60 MG  TABS (RALOXIFENE HCL) once daily MICARDIS 40 MG TABS (TELMISARTAN) one by mouth daily CARDIZEM CD 240 MG  CP24 (DILTIAZEM HCL COATED BEADS) once daily KLOR-CON 8 MEQ  TBCR (POTASSIUM CHLORIDE) once daily CALTRATE 600+D 600-400 MG-UNIT TABS (CALCIUM CARBONATE-VITAMIN D) 1 two times a day MAGNESIUM 250 MG TABS (MAGNESIUM) three times a day RECLAST 5 MG/100ML  SOLN  (ZOLEDRONIC ACID) yearly METFORMIN HCL 500 MG  TB24 (METFORMIN HCL) 2 tabs two times a day WARFARIN SODIUM 5 MG TABS (WARFARIN SODIUM) 7.5 for 3 days and 5 all others DIGOXIN 0.125 MG  TABS (DIGOXIN) take 1 by mouth qd ALPRAZOLAM 0.25 MG  TABS (ALPRAZOLAM) one by mouth q HS TRUETEST TEST   STRP (GLUCOSE BLOOD) two times a day * ONGLYZA 5 MG one by mouth daily ACTOS 45 MG TABS (PIOGLITAZONE HCL) 1 once daily CRESTOR 5 MG TABS (ROSUVASTATIN CALCIUM) 1 by mouth  MWF weekly MULTIVITAMINS  TABS (MULTIPLE VITAMIN) 1 once daily VITAMIN C CR 500 MG CR-CAPS (ASCORBIC ACID) 1 once daily VITAMIN D 2000 UNIT TABS (CHOLECALCIFEROL) 1 once daily B COMPLEX-B12  TABS (B COMPLEX VITAMINS) 1 once daily

## 2010-06-23 NOTE — Assessment & Plan Note (Signed)
Summary: 2 MONTH ROV/NJR   Vital Signs:  Patient Profile:   77 Years Old Female Height:     64 inches Weight:      162 pounds BMI:     27.91 Temp:     98.5 degrees F oral Pulse rate:   72 / minute Resp:     14 per minute BP sitting:   136 / 70  (left arm)  Vitals Entered By: Willy Eddy, LPN (February 23, 2008 10:59 AM)                 Chief Complaint:  ROA LABS.  Diabetes Management History:      The patient is an 77 years old female who comes in for evaluation of DM Type 2.  She is (or has been) enrolled in the "Diabetic Education Program".  She states understanding of dietary principles and is following her diet appropriately.  No sensory loss is reported.  She is checking home blood sugars.  She says that she is exercising.        Hypoglycemic symptoms are not occurring.  No hyperglycemic symptoms are reported.        Other questions/concerns include: increased.       Current Allergies: ! CODEINE CODEINE SULFATE (CODEINE SULFATE) CODEINE SULFATE (CODEINE SULFATE) CODEINE SULFATE (CODEINE SULFATE)  Past Medical History:    Reviewed history from 05/08/2007 and no changes required:       Atrial fibrillation       Diabetes mellitus, type II       Hypertension       Osteoarthritis       Osteoporosis  Past Surgical History:    Reviewed history from 05/08/2007 and no changes required:       Hysterectomy       Mastectomy       shoulder dislocation          Family History:    Reviewed history from 05/08/2007 and no changes required:       mother CHF  at 92       father MI at 48  Social History:    Reviewed history from 05/08/2007 and no changes required:       Married       Never Smoked       Alcohol use-no       Drug use-no       Regular exercise-yes   Risk Factors: Tobacco use:  never Passive smoke exposure:  no Drug use:  no Alcohol use:  no Exercise:  yes  Family History Risk Factors:    Family History of MI in females < 60 years old:   no    Family History of MI in males < 37 years old:  no   Review of Systems  The patient denies anorexia, fever, weight loss, weight gain, vision loss, decreased hearing, hoarseness, chest pain, syncope, dyspnea on exertion, peripheral edema, prolonged cough, headaches, hemoptysis, abdominal pain, melena, hematochezia, severe indigestion/heartburn, hematuria, incontinence, genital sores, muscle weakness, suspicious skin lesions, transient blindness, difficulty walking, depression, unusual weight change, abnormal bleeding, enlarged lymph nodes, angioedema, and breast masses.     Physical Exam  General:     Well-developed,well-nourished,in no acute distress; alert,appropriate and cooperative throughout examination Head:     normocephalic.   Ears:     External ear exam shows no significant lesions or deformities.  Otoscopic examination reveals clear canals, tympanic membranes are intact bilaterally without bulging, retraction, inflammation or discharge.  Hearing is grossly normal bilaterally. Nose:     nasal dischargemucosal pallor and mucosal edema.   Mouth:     pharyngeal erythema, pharyngeal exudate, and posterior lymphoid hypertrophy.   Neck:     supple.   Lungs:     Normal respiratory effort, chest expands symmetrically. Lungs are clear to auscultation, no crackles or wheezes. Heart:     no murmur, irregular rhythm, and Grade   2/6 systolic ejection murmur.   Abdomen:     Bowel sounds positive,abdomen soft and non-tender without masses, organomegaly or hernias noted.    Impression & Recommendations:  Problem # 1:  Preventive Health Care (ICD-V70.0) Flu Vaccine Consent Questions     Do you have a history of severe allergic reactions to this vaccine? no    Any prior history of allergic reactions to egg and/or gelatin? no    Do you have a sensitivity to the preservative Thimersol? no    Do you have a past history of Guillan-Barre Syndrome? no    Do you currently have an acute  febrile illness? no    Have you ever had a severe reaction to latex? no    Vaccine information given and explained to patient? yes    Are you currently pregnant? no    Lot Number:[id number]   Site Given  Left Deltoid IM   Problem # 2:  DIABETES MELLITUS, TYPE II (ICD-250.00) Assessment: Unchanged next step is to add lantus, the main problem is post pradial The following medications were removed from the medication list:    Amaryl 4 Mg Tabs (Glimepiride) .[name]... Take one (1) by mouth once a day  Her updated medication list for this problem includes:    Micardis Hct 40-12.5 Mg Tabs (Telmisartan-hctz) .[name]... 1/2 once daily    Metformin Hcl 500 Mg Tb24 (Metformin hcl) ..... One by mouth  bid replace amaryl with onglyza 5 mg one a dau Labs Reviewed: HgBA1c: 8.0 (02/14/2008)   Creat: 0.7 (01/02/2008)      Problem # 3:  ATRIAL FIBRILLATION (ICD-427.31)  Her updated medication list for this problem includes:    Metoprolol Tartrate 50 Mg Tabs (Metoprolol tartrate) .[name]..[name] Two times a day    Warfarin Sodium 5 Mg Tabs (Warfarin sodium) .[name]... 5,7 1/2    Digoxin 0.125 Mg Tabs (Digoxin) .[name]... Take 1 by mouth qd   Complete Medication List: 1)  Evista 60 Mg Tabs (Raloxifene hcl) .... Once daily 2)  Micardis Hct 40-12.5 Mg Tabs (Telmisartan-hctz) .... 1/2 once daily 3)  Cardizem Cd 240 Mg Cp24 (Diltiazem hcl coated beads) .... Once daily 4)  Klor-con 8 Meq Tbcr (Potassium chloride) .... Once daily 5)  Oscal 500/200 D-3 500-200 Mg-unit Tabs (Calcium-vitamin d) .[name].. 1650 total; 6)  Magnesium Oxide 400 Mg Caps (Magnesium oxide) .... 2 once daily 7)  Reclast 5 Mg/143ml Soln (Zoledronic acid) .... Yearly 8)  Metformin Hcl 500 Mg Tb24 (Metformin hcl) .... One by mouth  bid 9)  Metoprolol Tartrate 50 Mg Tabs (Metoprolol tartrate) .... Two times a day 10)  Warfarin Sodium 5 Mg Tabs (Warfarin sodium) .... 5,7 1/2 11)  Digoxin 0.125 Mg Tabs (Digoxin) .... Take 1 by mouth qd 12)  Alprazolam 0.25 Mg Tabs  (Alprazolam) .... One by mouth q hs 13)  Truetest Test Strp (Glucose blood) .... Two times a day 14)  Onglyza 5 Mg  .... One by mouth daily  Other Orders: Admin 1st Vaccine (54098) Flu Vaccine 20yrs + [phone])  Diabetes Management Assessment/Plan:  The following lipid goals have been established for the patient: Total cholesterol goal of 200; LDL cholesterol goal of 100; HDL cholesterol goal of 40; Triglyceride goal of 200.  Her blood pressure goal is < 130/80.     Patient Instructions: 1)  Please schedule a follow-up appointment in 2 months. 2)  BMP prior to visit, ICD-9:  250.82 3)  HbgA1C prior to visit, ICD-9:250.82   ]

## 2010-06-23 NOTE — Assessment & Plan Note (Signed)
Summary: PT/RCD---PT [facility] // RS   Nurse Visit   Allergies: 1)  ! Codeine 2)  Codeine Sulfate (Codeine Sulfate) 3)  Codeine Sulfate (Codeine Sulfate) 4)  Codeine Sulfate (Codeine Sulfate) Laboratory Results   Blood Tests   Date/Time Received: October 28, 2009 2:35 PM  Date/Time Reported: October 28, 2009 2:35 PM    INR: 1.6   (Normal Range: 0.88-1.12   Therap INR: 2.0-3.5) Comments: Wynona Canes, CMA  October 28, 2009 2:35 PM     Orders Added: 1)  Est. Patient Level I [99211] 2)  Protime [44034VQ]  Laboratory Results   Blood Tests      INR: 1.6   (Normal Range: 0.88-1.12   Therap INR: 2.0-3.5) Comments: Wynona Canes, CMA  October 28, 2009 2:35 PM       ANTICOAGULATION RECORD PREVIOUS REGIMEN & LAB RESULTS Anticoagulation Diagnosis:  v58.83,v58.61,427.31 on  05/31/2008 Previous INR Goal Range:  2.0-3.0 on  05/31/2008 Previous INR:  2.7 on  10/01/2009 Previous Coumadin Dose(mg):  7.5mg ,10mg  alt. on  08/08/2009 Previous Regimen:  same on  10/01/2009 Previous Coagulation Comments:  Dr. Kirtland Bouchard approved. on  01/31/2009  NEW REGIMEN & LAB RESULTS Current INR: 1.6 Regimen: same  (no change)       Repeat testing in: 4 weeks MEDICATIONS EVISTA 60 MG  TABS (RALOXIFENE HCL) once daily MICARDIS 40 MG TABS (TELMISARTAN) one by mouth daily CARDIZEM CD 240 MG  CP24 (DILTIAZEM HCL COATED BEADS) once daily KLOR-CON 8 MEQ  TBCR (POTASSIUM CHLORIDE) once daily CALTRATE 600+D 600-400 MG-UNIT TABS (CALCIUM CARBONATE-VITAMIN D) 1 two times a day MAGNESIUM 250 MG TABS (MAGNESIUM) three times a day RECLAST 5 MG/100ML  SOLN (ZOLEDRONIC ACID) yearly METFORMIN HCL 500 MG  TB24 (METFORMIN HCL) 2 tabs two times a day WARFARIN SODIUM 5 MG TABS (WARFARIN SODIUM) 7.5, daily DIGOXIN 0.125 MG  TABS (DIGOXIN) take 1 by mouth qd ALPRAZOLAM 0.25 MG  TABS (ALPRAZOLAM) one by mouth q HS TRUETEST TEST   STRP (GLUCOSE BLOOD) two times a day * ONGLYZA 5 MG one by mouth daily ACTOS 45 MG TABS  (PIOGLITAZONE HCL) 1 once daily CRESTOR 5 MG TABS (ROSUVASTATIN CALCIUM) 1 by mouth  MWF weekly MULTIVITAMINS  TABS (MULTIPLE VITAMIN) 1 once daily VITAMIN C CR 500 MG CR-CAPS (ASCORBIC ACID) 1 once daily VITAMIN D 2000 UNIT TABS (CHOLECALCIFEROL) 1 once daily B COMPLEX-B12  TABS (B COMPLEX VITAMINS) 1 once daily   Anticoagulation Visit Questionnaire      Coumadin dose missed/changed:  No      Abnormal Bleeding Symptoms:  No   Any diet changes including alcohol intake, vegetables or greens since the last visit:  No Any illnesses or hospitalizations since the last visit:  No Any signs of clotting since the last visit (including chest discomfort, dizziness, shortness of breath, arm tingling, slurred speech, swelling or redness in leg):  No

## 2010-06-23 NOTE — Progress Notes (Signed)
Summary: note for exercise class  Phone Note Call from Patient Call back at [phone]   Caller: pt vm triage Call For: jenkins Reason for Call: Acute Illness Summary of Call: [facility] for [facility] to take a swimming exercise class.  she will pick up the letter  Initial call taken by: Roselle Locus,  July 02, 2008 10:42 AM  Follow-up for Phone Call        pt informed-note is ready Follow-up by: Willy Eddy, LPN,  July 02, 2008 12:35 PM

## 2010-06-23 NOTE — Assessment & Plan Note (Signed)
Summary: ROPA/FUP/RCD   Vital Signs:  Patient profile:   77 year old female Height:      64 inches Weight:      161 pounds Temp:     98.1 degrees F oral Pulse rate:   64 / minute Pulse rhythm:   regular Resp:     14 per minute BP sitting:   142 / 80  (left arm)  Vitals Entered By: Willy Eddy, LPN (June 18, 2009 10:46 AM) CC: roa- continues to c/o insomnia- xanax didnt help, Hypertension Management, Lipid Management   Primary Care Provider:  Stacie Glaze MD  CC:  roa- continues to c/o insomnia- xanax didnt help, Hypertension Management, and Lipid Management.  History of Present Illness: episodic AF mainly at night that resolve with an extra 1/2 BB insomnia  ( worsened blood opressre is in good control review of the consult from the endocrinologist  Hypertension History:      She denies headache, chest pain, palpitations, dyspnea with exertion, orthopnea, PND, peripheral edema, visual symptoms, neurologic problems, syncope, and side effects from treatment.        Positive major cardiovascular risk factors include female age 62 years old or older, diabetes, hyperlipidemia, and hypertension.  Negative major cardiovascular risk factors include negative family history for ischemic heart disease and non-tobacco-user status.    Lipid Management History:      Positive NCEP/ATP III risk factors include female age 79 years old or older, diabetes, and hypertension.  Negative NCEP/ATP III risk factors include HDL cholesterol greater than 60, no family history for ischemic heart disease, and non-tobacco-user status.      Preventive Screening-Counseling & Management  Alcohol-Tobacco     Smoking Status: never     Passive Smoke Exposure: no  Allergies: 1)  ! Codeine 2)  Codeine Sulfate (Codeine Sulfate) 3)  Codeine Sulfate (Codeine Sulfate) 4)  Codeine Sulfate (Codeine Sulfate)  Past History:  Family History: Last updated: 05/08/2007 mother CHF  at 2 father MI at  81  Social History: Last updated: 05/08/2007 Married Never Smoked Alcohol use-no Drug use-no Regular exercise-yes  Risk Factors: Exercise: yes (05/08/2007)  Risk Factors: Smoking Status: never (06/18/2009) Passive Smoke Exposure: no (06/18/2009)  Past medical, surgical, family and social histories (including risk factors) reviewed, and no changes noted (except as noted below).  Past Medical History: Reviewed history from 04/07/2009 and no changes required. ADENOCARCINOMA, BREAST, HX OF (ICD-V10.3) SYNCOPE, HX OF (ICD-V12.49 Nuclear stress... April, 2008... no ischemia... EF 65% EF  60%... echo.. February, 2008 Aortic valve sclerosis.... echo.... mild FRACTURE, PELVIS, LEFT, HX OF (ICD-808.8) FRACTURE, FEMUR, RIGHT, HX OF (ICD-821.00) COUMADIN THERAPY (ICD-V58.61) ENCOUNTER FOR LONG-TERM USE OF ANTICOAGULANTS (ICD-V58.61) ATRIAL FIBRILLATION (ICD-427.31)...infrequent.. paroxysmal.. Coumadin therapy HYPERLIPIDEMIA, WITH HIGH HDL (ICD-272.4) ENCOUNTER FOR THERAPEUTIC DRUG MONITORING (ICD-V58.83) ACUTE SINUSITIS, UNSPECIFIED (ICD-461.9) URI (ICD-465.9) OSTEOPOROSIS (ICD-733.00) OSTEOARTHRITIS (ICD-715.90) HYPERTENSION (ICD-401.9) DIABETES MELLITUS, TYPE II (ICD-250.00) HYPERTENSION (ICD-401.9) DIABETES MELLITUS, TYPE II (ICD-250.00)    Past Surgical History: Reviewed history from 12/17/2008 and no changes required. HYSTERECTOMY (ICD-V88.01) * LEFT SHOULDER SURGERY MASTECTOMY, LEFT, HX OF (ICD-V45.71)  Family History: Reviewed history from 05/08/2007 and no changes required. mother CHF  at 82 father MI at 15  Social History: Reviewed history from 05/08/2007 and no changes required. Married Never Smoked Alcohol use-no Drug use-no Regular exercise-yes  Review of Systems  The patient denies anorexia, fever, weight loss, weight gain, vision loss, decreased hearing, hoarseness, chest pain, syncope, dyspnea on exertion, peripheral edema, prolonged cough,  headaches, hemoptysis, abdominal  pain, melena, hematochezia, severe indigestion/heartburn, hematuria, incontinence, genital sores, muscle weakness, suspicious skin lesions, transient blindness, difficulty walking, depression, unusual weight change, abnormal bleeding, enlarged lymph nodes, angioedema, and breast masses.    Physical Exam  General:  Well-developed,well-nourished,in no acute distress; alert,appropriate and cooperative throughout examination Head:  Normocephalic and atraumatic without obvious abnormalities. No apparent alopecia or balding. Eyes:  pupils equal and pupils round.   Nose:  no external deformity and nasal dischargemucosal pallor.   Neck:  No deformities, masses, or tenderness noted. Lungs:  Normal respiratory effort, chest expands symmetrically. Lungs are clear to auscultation, no crackles or wheezes. Heart:  no murmur, irregular rhythm, and Grade   2/6 systolic ejection murmur.   Abdomen:  Bowel sounds positive,abdomen soft and non-tender without masses, organomegaly or hernias noted.   Impression & Recommendations:  Problem # 1:  ATRIAL FIBRILLATION (ICD-427.31) Assessment Unchanged change in coumadin discussed Her updated medication list for this problem includes:    Cardizem Cd 240 Mg Cp24 (Diltiazem hcl coated beads) ..... Once daily    Warfarin Sodium 5 Mg Tabs (Warfarin sodium) .[name]... 7.5, daily    Digoxin 0.125 Mg Tabs (Digoxin) .[name]... Take 1 by mouth qd  Orders: Protime ([id number]) Fingerstick (45409) change to 7.5 daily and check in 1 month  Reviewed the following: PT: 21.6 (06/18/2009)   INR: 3.2 (06/18/2009) Next Protime: 1 month (dated on 03/26/2009)  Problem # 2:  HYPERLIPIDEMIA, WITH HIGH HDL (ICD-272.4) Assessment: Unchanged  Her updated medication list for this problem includes:    Crestor 5 Mg Tabs (Rosuvastatin calcium) .[name]... 1 by mouth  mwf weekly  Labs Reviewed: SGOT: 7 (11/27/2008)   SGPT: 17 (11/27/2008)  Lipid Goals: Chol Goal:  200 (03/10/2009)   HDL Goal: 40 (03/10/2009)   LDL Goal: 100 (03/10/2009)   TG Goal: 150 (03/10/2009)  Prior 10 Yr Risk Heart Disease: 9 % (12/09/2008)   HDL:62 (03/10/2009), 68 (12/09/2008)  LDL:62 (03/10/2009), 92 (12/09/2008)  Chol:127 (03/10/2009), 163 (12/09/2008)  Trig:18 (03/10/2009), 107 (12/09/2008)  Problem # 3:  OSTEOARTHRITIS (ICD-715.90)  Discussed use of medications, application of heat or cold, and exercises.   Problem # 4:  DIABETES MELLITUS, TYPE II (ICD-250.00) monitered by the endocrinologist Her updated medication list for this problem includes:    Micardis 40 Mg Tabs (Telmisartan) ..... One by mouth daily    Metformin Hcl 500 Mg Tb24 (Metformin hcl) .[name]... 2 tabs two times a day    Actos 45 Mg Tabs (Pioglitazone hcl) .[name]... 1 once daily  Labs Reviewed: Creat: 0.7 (04/22/2008)    Reviewed HgBA1c results: 6.4 (03/04/2009)  7.5 (04/22/2008)  Complete Medication List: 1)  Evista 60 Mg Tabs (Raloxifene hcl) .... Once daily 2)  Micardis 40 Mg Tabs (Telmisartan) .... One by mouth daily 3)  Cardizem Cd 240 Mg Cp24 (Diltiazem hcl coated beads) .... Once daily 4)  Klor-con 8 Meq Tbcr (Potassium chloride) .... Once daily 5)  Caltrate 600+d 600-400 Mg-unit Tabs (Calcium carbonate-vitamin d) .[name].. 1 two times a day 6)  Magnesium 250 Mg Tabs (Magnesium) .... Three times a day 7)  Reclast 5 Mg/124ml Soln (Zoledronic acid) .... Yearly 8)  Metformin Hcl 500 Mg Tb24 (Metformin hcl) .... 2 tabs two times a day 9)  Warfarin Sodium 5 Mg Tabs (Warfarin sodium) .... 7.5, daily 10)  Digoxin 0.125 Mg Tabs (Digoxin) .... Take 1 by mouth qd 11)  Alprazolam 0.25 Mg Tabs (Alprazolam) .... One by mouth q hs 12)  Truetest Test Strp (Glucose blood) .... Two times  a day 13)  Onglyza 5 Mg  .... One by mouth daily 14)  Actos 45 Mg Tabs (Pioglitazone hcl) .[name].. 1 once daily 15)  Crestor 5 Mg Tabs (Rosuvastatin calcium) .[name].. 1 by mouth  mwf weekly 16)  Multivitamins Tabs (Multiple vitamin) .[name].. 1  once daily 17)  Vitamin C Cr 500 Mg Cr-caps (Ascorbic acid) .[name].. 1 once daily 18)  Vitamin D 2000 Unit Tabs (Cholecalciferol) .[name].. 1 once daily 19)  B Complex-b12 Tabs (B complex vitamins) .[name].. 1 once daily  Hypertension Assessment/Plan:      The patient's hypertensive risk group is category C: Target organ damage and/or diabetes.  Her calculated 10 year risk of coronary heart disease is 13 %.  Today's blood pressure is 142/80.  Her blood pressure goal is < 130/80.  Lipid Assessment/Plan:      Based on NCEP/ATP III, the patient's risk factor category is "history of diabetes".  The patient's lipid goals are as follows: Total cholesterol goal is 200; LDL cholesterol goal is 100; HDL cholesterol goal is 40; Triglyceride goal is 150.  Her LDL cholesterol goal has been met.    Patient Instructions: 1)  Please schedule a follow-up appointment in 3 months.   ANTICOAGULATION RECORD PREVIOUS REGIMEN & LAB RESULTS Anticoagulation Diagnosis:  v58.83,v58.61,427.31 on  05/31/2008 Previous INR Goal Range:  2.0-3.0 on  05/31/2008 Previous INR:  3.4 on  05/28/2009 Previous Coumadin Dose(mg):  7.5mg  on m,w,f 5mg  other days on  05/31/2008 Previous Regimen:  Pt was taking previous regimen of 7.5mg  , 10mg  alternating , informed and wrote directions to take 7.5mg , 7.5mg , 10mg  alternating then RTO 3 wks. on  05/28/2009 Previous Coagulation Comments:  Dr. Kirtland Bouchard approved. on  01/31/2009  NEW REGIMEN & LAB RESULTS Current INR: 3.2 Regimen: Pt was taking previous regimen of 7.5mg  , 10mg  alternating , informed and wrote directions to take 7.5mg , 7.5mg , 10mg  alternating then RTO 3 wks.  (no change)   Anticoagulation Visit Questionnaire Coumadin dose missed/changed:  No Abnormal Bleeding Symptoms:  No  Any diet changes including alcohol intake, vegetables or greens since the last visit:  No Any illnesses or hospitalizations since the last visit:  No Any signs of clotting since the last visit (including chest  discomfort, dizziness, shortness of breath, arm tingling, slurred speech, swelling or redness in leg):  No  MEDICATIONS EVISTA 60 MG  TABS (RALOXIFENE HCL) once daily MICARDIS 40 MG TABS (TELMISARTAN) one by mouth daily CARDIZEM CD 240 MG  CP24 (DILTIAZEM HCL COATED BEADS) once daily KLOR-CON 8 MEQ  TBCR (POTASSIUM CHLORIDE) once daily CALTRATE 600+D 600-400 MG-UNIT TABS (CALCIUM CARBONATE-VITAMIN D) 1 two times a day MAGNESIUM 250 MG TABS (MAGNESIUM) three times a day RECLAST 5 MG/100ML  SOLN (ZOLEDRONIC ACID) yearly METFORMIN HCL 500 MG  TB24 (METFORMIN HCL) 2 tabs two times a day WARFARIN SODIUM 5 MG TABS (WARFARIN SODIUM) 7.5, daily DIGOXIN 0.125 MG  TABS (DIGOXIN) take 1 by mouth qd ALPRAZOLAM 0.25 MG  TABS (ALPRAZOLAM) one by mouth q HS TRUETEST TEST   STRP (GLUCOSE BLOOD) two times a day * ONGLYZA 5 MG one by mouth daily ACTOS 45 MG TABS (PIOGLITAZONE HCL) 1 once daily CRESTOR 5 MG TABS (ROSUVASTATIN CALCIUM) 1 by mouth  MWF weekly MULTIVITAMINS  TABS (MULTIPLE VITAMIN) 1 once daily VITAMIN C CR 500 MG CR-CAPS (ASCORBIC ACID) 1 once daily VITAMIN D 2000 UNIT TABS (CHOLECALCIFEROL) 1 once daily B COMPLEX-B12  TABS (B COMPLEX VITAMINS) 1 once daily     Laboratory Results  Blood Tests     PT: 21.6 s   (Normal Range: 10.6-13.4)  INR: 3.2   (Normal Range: 0.88-1.12   Therap INR: 2.0-3.5) Comments: Rita Ohara  June 18, 2009 10:40 AM

## 2010-06-23 NOTE — Assessment & Plan Note (Signed)
Summary: 3 MONTH ROA/JLS   Vital Signs:  Patient Profile:   77 Years Old Female Height:     64 inches Weight:      181 pounds Temp:     98.2 degrees F oral Pulse rate:   60 / minute Resp:     14 per minute BP sitting:   132 / 80  (left arm)  Vitals Entered By: Willy Eddy, LPN (February 01, 2007 10:58 AM)                 Chief Complaint:  ROA.  History of Present Illness:  Hypertension Follow-Up      This is an 77 year old woman who presents for Hypertension follow-up.  Atrial fibrilation only flairs with caffine.  The patient denies lightheadedness, urinary frequency, headaches, edema, impotence, rash, and fatigue.  The patient denies the following associated symptoms: chest pain, chest pressure, exercise intolerance, dyspnea, palpitations, syncope, leg edema, and pedal edema.  Compliance with medications (by patient report) has been near 100%.  The patient reports that dietary compliance has been good.  The patient reports exercising 3-4X per week.  Adjunctive measures currently used by the patient include salt restriction.    Current Allergies: CODEINE SULFATE (CODEINE SULFATE) CODEINE SULFATE (CODEINE SULFATE) CODEINE SULFATE (CODEINE SULFATE)  Past Medical History:    Reviewed history and no changes required:       Diabetes mellitus, type II       Hypertension       Osteoporosis       atrial fib       chronic anticagulation  Past Surgical History:    Reviewed history and no changes required:       femur fx with nail       breast cancer       Hysterectomy       Tonsillectomy       Rotator cuff repair   Family History:    Reviewed history and no changes required:       father died at 61 from CAD       mother died at 63  Social History:    Reviewed history and no changes required:       Retired       Married       Never Smoked   Risk Factors:  Tobacco use:  never   Review of Systems  The patient denies fever, weight loss, vision loss,  decreased hearing, chest pain, dyspnea on exhertion, peripheral edema, hemoptysis, abdominal pain, melena, hematochezia, severe indigestion/heartburn, depression, abnormal bleeding, enlarged lymph nodes, angioedema, and breast masses.     Physical Exam  General:     alert and overweight-appearing.   Head:     normocephalic.   Ears:     R ear normal and L ear normal.   Nose:     no nasal discharge.   Mouth:     pharynx pink and moist.   Neck:     supple.   Lungs:     normal respiratory effort, normal breath sounds, no dullness, and no wheezes.   Heart:     normal rate and regular rhythm.   Abdomen:     soft, non-tender, and normal bowel sounds.   Extremities:     1+ left pedal edema and 1+ right pedal edema.   Neurologic:     alert & oriented X3, cranial nerves II-XII intact, and strength normal in all extremities.  Impression & Recommendations:  Problem # 1:  DIABETES MELLITUS, TYPE II (ICD-250.00)  The following medications were removed from the medication list:    Actos 30 Mg Tabs (Pioglitazone hcl) .[name]... Take 1 tablet by mouth once a day  Her updated medication list for this problem includes:    Glimepiride 1 Mg Tabs (Glimepiride) ..... Once daily    Micardis Hct 40-12.5 Mg Tabs (Telmisartan-hctz) .[name]... 1/2 once daily  Labs Reviewed: HgBA1c: 6.9 (11/01/2006)   Creat: 0.7 (11/01/2006)     Orders: TLB-A1C / Hgb A1C (Glycohemoglobin) (83036-A1C) Venipuncture (53664)   Problem # 2:  HYPERTENSION (ICD-401.9)  The following medications were removed from the medication list:    Cartia Xt 180 Mg Cp24 (Diltiazem hcl coated beads)  Her updated medication list for this problem includes:    Hydrochlorothiazide 25 Mg Tabs (Hydrochlorothiazide) .[name]... Take 1/2 tablet by mouth every morning    Metoprolol Succinate 50 Mg Tb24 (Metoprolol succinate) ..... Once daily    Micardis Hct 40-12.5 Mg Tabs (Telmisartan-hctz) .[name]... 1/2 once daily    Cardizem Cd 240 Mg Cp24  (Diltiazem hcl coated beads) ..... Once daily  BP today: 142/80 Prior BP: 118/59 (01/09/2007)  Labs Reviewed: Creat: 0.7 (11/01/2006)   Problem # 3:  ATRIAL FIBRILLATION (ICD-427.31)  Her updated medication list for this problem includes:    Warfarin Sodium 5 Mg Tabs (Warfarin sodium) .[name]... 5,10.    Metoprolol Succinate 50 Mg Tb24 (Metoprolol succinate) ..... Once daily  Reviewed the following: PT: 20.9 (01/09/2007)   INR: 3.0 (01/09/2007) Next Protime: 4 weeks (dated on 01/09/2007)   Problem # 4:  COUMADIN THERAPY (ICD-V58.61) Assessment: Unchanged  Problem # 5:  OSTEOPOROSIS (ICD-733.00)  Her updated medication list for this problem includes:    Evista 60 Mg Tabs (Raloxifene hcl) ..... Once daily    Oscal 500/200 D-3 500-200 Mg-unit Tabs (Calcium-vitamin d) .[name]... 1650 total;    Reclast 5 Mg/168ml Soln (Zoledronic acid) ..... Yearly   Medications Added to Medication List This Visit: 1)  Glimepiride 1 Mg Tabs (Glimepiride) .... Once daily 2)  Warfarin Sodium 5 Mg Tabs (Warfarin sodium) .... 5,10. 3)  Evista 60 Mg Tabs (Raloxifene hcl) .... Once daily 4)  Metoprolol Succinate 50 Mg Tb24 (Metoprolol succinate) .... Once daily 5)  Micardis Hct 40-12.5 Mg Tabs (Telmisartan-hctz) .... 1/2 once daily 6)  Cardizem Cd 240 Mg Cp24 (Diltiazem hcl coated beads) .... Once daily 7)  Klor-con 8 Meq Tbcr (Potassium chloride) .... Once daily 8)  Oscal 500/200 D-3 500-200 Mg-unit Tabs (Calcium-vitamin d) .[name].. 1650 total; 9)  Magnesium Oxide 400 Mg Caps (Magnesium oxide) .... 2 once daily 10)  Reclast 5 Mg/167ml Soln (Zoledronic acid) .... Yearly  Complete Medication List: 1)  Glimepiride 1 Mg Tabs (Glimepiride) .... Once daily 2)  Hydrochlorothiazide 25 Mg Tabs (Hydrochlorothiazide) .... Take 1/2 tablet by mouth every morning 3)  Warfarin Sodium 5 Mg Tabs (Warfarin sodium) .... 5,10. 4)  Evista 60 Mg Tabs (Raloxifene hcl) .... Once daily 5)  Metoprolol Succinate 50 Mg Tb24 (Metoprolol  succinate) .... Once daily 6)  Micardis Hct 40-12.5 Mg Tabs (Telmisartan-hctz) .... 1/2 once daily 7)  Cardizem Cd 240 Mg Cp24 (Diltiazem hcl coated beads) .... Once daily 8)  Klor-con 8 Meq Tbcr (Potassium chloride) .... Once daily 9)  Oscal 500/200 D-3 500-200 Mg-unit Tabs (Calcium-vitamin d) .[name].. 1650 total; 10)  Magnesium Oxide 400 Mg Caps (Magnesium oxide) .... 2 once daily 11)  Reclast 5 Mg/172ml Soln (Zoledronic acid) .... Yearly  Patient Instructions: 1)  Please schedule a follow-up appointment in 3 months.    ]

## 2010-06-23 NOTE — Assessment & Plan Note (Signed)
Summary: F/U (POST FALL ON 9/20) // RS   Vital Signs:  Patient profile:   77 year old female Weight:      160 pounds Temp:     98.7 degrees F oral BP sitting:   170 / 80  (left arm) Cuff size:   regular  Vitals Entered By: Sid Falcon LPN (February 13, 2010 11:03 AM)  History of Present Illness: Patient status post fall on 02/10/10. No syncope. She was walking to her kitchen and tripped. She landed on left forearm, right knee, and mostly right anterior chest wall pain. Went to orthopedist yesterday x-rays reportedly negative for rib fracture.  Left forearm and knee pain relatively mild. Ambulating without difficulty. No shoulder pain. Chest wall pain 8/10 in severity and worse with cough and deep breathing. No pain at rest. Tylenol helps somewhat. Patient takes Coumadin. No hemoptysis.  Intolerance to other pain meds in past.  History of hypertension. Blood pressures have been well-controlled . Compliant with all medications.  Allergies: 1)  ! Codeine 2)  Codeine Sulfate (Codeine Sulfate) 3)  Codeine Sulfate (Codeine Sulfate) 4)  Codeine Sulfate (Codeine Sulfate)  Past History:  Past Medical History: Last updated: 04/07/2009 ADENOCARCINOMA, BREAST, HX OF (ICD-V10.3) SYNCOPE, HX OF (ICD-V12.49 Nuclear stress... April, 2008... no ischemia... EF 65% EF  60%... echo.. February, 2008 Aortic valve sclerosis.... echo.... mild FRACTURE, PELVIS, LEFT, HX OF (ICD-808.8) FRACTURE, FEMUR, RIGHT, HX OF (ICD-821.00) COUMADIN THERAPY (ICD-V58.61) ENCOUNTER FOR LONG-TERM USE OF ANTICOAGULANTS (ICD-V58.61) ATRIAL FIBRILLATION (ICD-427.31)...infrequent.. paroxysmal.. Coumadin therapy HYPERLIPIDEMIA, WITH HIGH HDL (ICD-272.4) ENCOUNTER FOR THERAPEUTIC DRUG MONITORING (ICD-V58.83) ACUTE SINUSITIS, UNSPECIFIED (ICD-461.9) URI (ICD-465.9) OSTEOPOROSIS (ICD-733.00) OSTEOARTHRITIS (ICD-715.90) HYPERTENSION (ICD-401.9) DIABETES MELLITUS, TYPE II (ICD-250.00) HYPERTENSION  (ICD-401.9) DIABETES MELLITUS, TYPE II (ICD-250.00)    Past Surgical History: Last updated: 12/17/2008 HYSTERECTOMY (ICD-V88.01) * LEFT SHOULDER SURGERY MASTECTOMY, LEFT, HX OF (ICD-V45.71) PMH reviewed for relevance, PSH reviewed for relevance  Review of Systems  The patient denies syncope, dyspnea on exertion, headaches, muscle weakness, and difficulty walking.    Physical Exam  General:  Well-developed,well-nourished,in no acute distress; alert,appropriate and cooperative throughout examination Neck:  No deformities, masses, or tenderness noted. Chest Wall:  area of tenderness around the fourth rib right anterior. No ecchymosis Lungs:  Normal respiratory effort, chest expands symmetrically. Lungs are clear to auscultation, no crackles or wheezes. Heart:  normal rate and regular rhythm.   Extremities:  left forearm reveals some mild ecchymosis. Minimal tenderness. Full range of motion left wrist and left elbow. Right knee reveals some ecchymosis. Crepitus with flexion-extension. No knee effusion. Only very minimal patellar tenderness. Ambulating without difficulty. Neurologic:  alert & oriented X3, cranial nerves II-XII intact, and gait normal.   Skin:  no rashes and no suspicious lesions.   Psych:  normally interactive, good eye contact, not anxious appearing, and not depressed appearing.     Impression & Recommendations:  Problem # 1:  CONTUSION, CHEST WALL (ICD-922.1) Explained rib fxs sometimes difficult to see initially.  She knows importance of some deep breathing. Discussed other pain med options and she is reluctant to take anything other than Tylenol. She is aware this will probably take weeks to resolve.  Problem # 2:  HYPERTENSION (ICD-401.9) Assessment: Deteriorated She is encouraged to monitor very closely next few days and prompt f/u with primary if no better though she assures me today's reading is very atypical for her. Her updated medication list for this  problem includes:    Losartan Potassium 100 Mg Tabs (Losartan potassium) .Marland KitchenMarland KitchenMarland KitchenMarland Kitchen  One by mouth daily    Cardizem Cd 240 Mg Cp24 (Diltiazem hcl coated beads) ..... Once daily    Metoprolol Tartrate 50 Mg Tabs (Metoprolol tartrate) .[name]... 1 two times a day  Problem # 3:  CONTUSION OF KNEE (ICD-924.11) ambulating well. Observe.  Problem # 4:  CONTUSION OF FOREARM (ICD-923.10) no evidence clinically to suggest fx.   Complete Medication List: 1)  Evista 60 Mg Tabs (Raloxifene hcl) .... Once daily 2)  Losartan Potassium 100 Mg Tabs (Losartan potassium) .... One by mouth daily 3)  Cardizem Cd 240 Mg Cp24 (Diltiazem hcl coated beads) .... Once daily 4)  Klor-con 8 Meq Tbcr (Potassium chloride) .... Once daily 5)  Caltrate 600+d 600-400 Mg-unit Tabs (Calcium carbonate-vitamin d) .[name].. 1 two times a day 6)  Magnesium 250 Mg Tabs (Magnesium) .... Three times a day 7)  Reclast 5 Mg/136ml Soln (Zoledronic acid) .... Yearly 8)  Warfarin Sodium 5 Mg Tabs (Warfarin sodium) .... 7.5, daily 9)  Digoxin 0.125 Mg Tabs (Digoxin) .... Take 1 by mouth qd 10)  Alprazolam 0.25 Mg Tabs (Alprazolam) .... One by mouth q hs 11)  Truetest Test Strp (Glucose blood) .... Two times a day 12)  Kombiglyze Xr 2.09-998 Mg Xr24h-tab (Saxagliptin-metformin) .... One by mouth two times a day 13)  Actos 45 Mg Tabs (Pioglitazone hcl) .[name].. 1 once daily 14)  Crestor 5 Mg Tabs (Rosuvastatin calcium) .[name].. 1 by mouth  mwf weekly 15)  Multivitamins Tabs (Multiple vitamin) .[name].. 1 once daily 16)  Vitamin C Cr 500 Mg Cr-caps (Ascorbic acid) .[name].. 1 once daily 17)  Vitamin D 2000 Unit Tabs (Cholecalciferol) .[name].. 1 once daily 18)  B Complex-b12 Tabs (B complex vitamins) .[name].. 1 once daily 19)  Metoprolol Tartrate 50 Mg Tabs (Metoprolol tartrate) .[name].. 1 two times a day  Patient Instructions: 1)  Continue Tylenol as needed for pain 2)  You will very likely have some right chest wall tenderness and soreness for several weeks 3)  Check your   Blood Pressure regularly . If it is above:140/90   you should make an appointment with Dr Lovell Sheehan

## 2010-06-23 NOTE — Assessment & Plan Note (Signed)
Summary: pt/mhf   Nurse Visit   Vital Signs:  Patient profile:   77 year old female BP sitting:   122 / 80 Cuff size:   regular    Allergies: 1)  ! Codeine 2)  Codeine Sulfate (Codeine Sulfate) 3)  Codeine Sulfate (Codeine Sulfate) 4)  Codeine Sulfate (Codeine Sulfate)  Laboratory Results   Blood Tests     PT: 17.0 s   (Normal Range: 10.6-13.4)  INR: 2.5   (Normal Range: 0.88-1.12   Therap INR: 2.0-3.5) Comments: Army Fossa CMA  November 22, 2008 2:34 PM      Orders Added: 1)  Est. Patient Level I [99211] 2)  Fingerstick [36416] 3)  Protime [[id number]]      ANTICOAGULATION RECORD PREVIOUS REGIMEN & LAB RESULTS Anticoagulation Diagnosis:  v58.83,v58.61,427.31 on  05/31/2008 Previous INR Goal Range:  2.0-3.0 on  05/31/2008 Previous INR:  1.6 on  10/28/2008 Previous Coumadin Dose(mg):  7.5mg  on m,w,f 5mg  other days on  05/31/2008 Previous Regimen:  7.5mg . 7.5mg . 10mg . ALT on  10/28/2008 Previous Coagulation Comments:  Dr. Kirtland Bouchard approved on  02/02/2008  NEW REGIMEN & LAB RESULTS Current INR: 2.5 Regimen: 7.5mg . 7.5mg . 10mg . ALT  (no change)  Repeat testing in: 4 weeks  Anticoagulation Visit Questionnaire Coumadin dose missed/changed:  No Abnormal Bleeding Symptoms:  No  Any diet changes including alcohol intake, vegetables or greens since the last visit:  No Any illnesses or hospitalizations since the last visit:  No Any signs of clotting since the last visit (including chest discomfort, dizziness, shortness of breath, arm tingling, slurred speech, swelling or redness in leg):  No  MEDICATIONS EVISTA 60 MG  TABS (RALOXIFENE HCL) once daily MICARDIS 40 MG TABS (TELMISARTAN) one by mouth daily CARDIZEM CD 240 MG  CP24 (DILTIAZEM HCL COATED BEADS) once daily KLOR-CON 8 MEQ  TBCR (POTASSIUM CHLORIDE) once daily OSCAL 500/200 D-3 500-200 MG-UNIT  TABS (CALCIUM-VITAMIN D) 1650 TOTAL; MAGNESIUM OXIDE 400 MG  CAPS (MAGNESIUM OXIDE) 2 once daily RECLAST 5  MG/100ML  SOLN (ZOLEDRONIC ACID) yearly METFORMIN HCL 500 MG  TB24 (METFORMIN HCL) 2 two times a day METOPROLOL TARTRATE 50 MG TABS (METOPROLOL TARTRATE) two times a day WARFARIN SODIUM 5 MG TABS (WARFARIN SODIUM) 7.5 for 3 days and 5 all others DIGOXIN 0.125 MG  TABS (DIGOXIN) take 1 by mouth qd ALPRAZOLAM 0.25 MG  TABS (ALPRAZOLAM) one by mouth q HS TRUETEST TEST   STRP (GLUCOSE BLOOD) two times a day * ONGLYZA 5 MG one by mouth daily ACTOS 45 MG TABS (PIOGLITAZONE HCL) 1 once daily CRESTOR 5 MG TABS (ROSUVASTATIN CALCIUM) 1 by mouth  MWF weekly MULTIVITAMINS  TABS (MULTIPLE VITAMIN) 1 once daily VITAMIN C CR 500 MG CR-CAPS (ASCORBIC ACID) 1 once daily      Vital Signs:  Patient Profile:   77 year old female Height:     64 inches BP sitting:   122 / 80  (left arm) Cuff size:   regular

## 2010-06-23 NOTE — Assessment & Plan Note (Signed)
Summary: 3 MONTH ROV/NJR   Vital Signs:  Patient profile:   77 year old female Height:      64 inches Weight:      158 pounds Temp:     98.2 degrees F oral Pulse rate:   68 / minute Resp:     14 per minute BP sitting:   130 / 64  (left arm)  Vitals Entered By: Willy Eddy, LPN (March 10, 2009 11:23 AM)  CC:  roa.  History of Present Illness: The A1C report is 6.4 from  03/04/09  Hypertension Follow-Up      This is an 77 year old woman who presents for Hypertension follow-up.  chronic AF.  The patient denies lightheadedness, urinary frequency, headaches, edema, impotence, rash, and fatigue.  The patient denies the following associated symptoms: chest pain, chest pressure, exercise intolerance, dyspnea, palpitations, syncope, leg edema, and pedal edema.  Compliance with medications (by patient report) has been near 100%.  The patient reports that dietary compliance has been good.  The patient reports exercising 3-4X per week.  Adjunctive measures currently used by the patient include salt restriction.    Lipid Management History:      Positive NCEP/ATP III risk factors include female age 75 years old or older, diabetes, and hypertension.  Negative NCEP/ATP III risk factors include HDL cholesterol greater than 60, no family history for ischemic heart disease, and non-tobacco-user status.    Allergies: 1)  ! Codeine 2)  Codeine Sulfate (Codeine Sulfate) 3)  Codeine Sulfate (Codeine Sulfate) 4)  Codeine Sulfate (Codeine Sulfate)  Past History:  Family History: Last updated: 05/08/2007 mother CHF  at 99 father MI at 49  Social History: Last updated: 05/08/2007 Married Never Smoked Alcohol use-no Drug use-no Regular exercise-yes  Risk Factors: Exercise: yes (05/08/2007)  Risk Factors: Smoking Status: never (05/08/2007) Passive Smoke Exposure: no (05/08/2007)  Past medical, surgical, family and social histories (including risk factors) reviewed, and no changes  noted (except as noted below).  Past Medical History: Reviewed history from 12/17/2008 and no changes required. ADENOCARCINOMA, BREAST, HX OF (ICD-V10.3) SYNCOPE, HX OF (ICD-V12.49) AORTIC SCLEROSIS (ICD-424.1) FRACTURE, PELVIS, LEFT, HX OF (ICD-808.8) FRACTURE, FEMUR, RIGHT, HX OF (ICD-821.00) COUMADIN THERAPY (ICD-V58.61) ENCOUNTER FOR LONG-TERM USE OF ANTICOAGULANTS (ICD-V58.61) ATRIAL FIBRILLATION (ICD-427.31) HYPERLIPIDEMIA, WITH HIGH HDL (ICD-272.4) ATRIAL FIBRILLATION (ICD-427.31) COUMADIN THERAPY (ICD-V58.61) ENCOUNTER FOR THERAPEUTIC DRUG MONITORING (ICD-V58.83) ACUTE SINUSITIS, UNSPECIFIED (ICD-461.9) URI (ICD-465.9) OSTEOPOROSIS (ICD-733.00) OSTEOARTHRITIS (ICD-715.90) HYPERTENSION (ICD-401.9) DIABETES MELLITUS, TYPE II (ICD-250.00) ATRIAL FIBRILLATION (ICD-427.31) OSTEOPOROSIS (ICD-733.00) HYPERTENSION (ICD-401.9) DIABETES MELLITUS, TYPE II (ICD-250.00) ATRIAL FIBRILLATION (ICD-427.31) COUMADIN THERAPY (ICD-V58.61) ENCOUNTER FOR THERAPEUTIC DRUG MONITORING (ICD-V58.83)    Past Surgical History: Reviewed history from 12/17/2008 and no changes required. HYSTERECTOMY (ICD-V88.01) * LEFT SHOULDER SURGERY MASTECTOMY, LEFT, HX OF (ICD-V45.71)  Family History: Reviewed history from 05/08/2007 and no changes required. mother CHF  at 33 father MI at 26  Social History: Reviewed history from 05/08/2007 and no changes required. Married Never Smoked Alcohol use-no Drug use-no Regular exercise-yes  Review of Systems  The patient denies anorexia, fever, weight loss, weight gain, vision loss, decreased hearing, hoarseness, chest pain, syncope, dyspnea on exertion, peripheral edema, prolonged cough, headaches, hemoptysis, abdominal pain, melena, hematochezia, severe indigestion/heartburn, hematuria, incontinence, genital sores, muscle weakness, suspicious skin lesions, transient blindness, difficulty walking, depression, unusual weight change, abnormal bleeding,  enlarged lymph nodes, angioedema, and breast masses.    Physical Exam  General:  Well-developed,well-nourished,in no acute distress; alert,appropriate and cooperative throughout examination Head:  Normocephalic and atraumatic  without obvious abnormalities. No apparent alopecia or balding. Nose:  no external deformity and nasal dischargemucosal pallor.   Mouth:  pharynx pink and moist and no erythema.   Neck:  No deformities, masses, or tenderness noted. Lungs:  Normal respiratory effort, chest expands symmetrically. Lungs are clear to auscultation, no crackles or wheezes. Heart:  no murmur, irregular rhythm, and Grade   2/6 systolic ejection murmur.   Abdomen:  Bowel sounds positive,abdomen soft and non-tender without masses, organomegaly or hernias noted. Extremities:  No clubbing, cyanosis, edema, or deformity noted with normal full range of motion of all joints.   Neurologic:  No cranial nerve deficits noted. Station and gait are normal. Plantar reflexes are down-going bilaterally. DTRs are symmetrical throughout. Sensory, motor and coordinative functions appear intact.   Impression & Recommendations:  Problem # 1:  ATRIAL FIBRILLATION (ICD-427.31) Assessment Unchanged stable rates with occasional PAC's Her updated medication list for this problem includes:    Cardizem Cd 240 Mg Cp24 (Diltiazem hcl coated beads) ..... Once daily    Metoprolol Tartrate 50 Mg Tabs (Metoprolol tartrate) .[name]..[name] Two times a day    Warfarin Sodium 5 Mg Tabs (Warfarin sodium) .[name]... 7.5 for 3 days and 5 all others    Digoxin 0.125 Mg Tabs (Digoxin) .[name]... Take 1 by mouth qd  Reviewed the following: PT: 20.1 (02/25/2009)   INR: 2.8 (02/25/2009) Next Protime: 3 weeks (dated on 01/31/2009)  Problem # 2:  HYPERLIPIDEMIA, WITH HIGH HDL (ICD-272.4) Assessment: Unchanged  Her updated medication list for this problem includes:    Crestor 5 Mg Tabs (Rosuvastatin calcium) .[name]... 1 by mouth  mwf weekly  Labs  Reviewed: SGOT: 7 (11/27/2008)   SGPT: 17 (11/27/2008)  Prior 10 Yr Risk Heart Disease: 9 % (12/09/2008)   HDL:62 (03/10/2009), 68 (12/09/2008)  LDL:62 (03/10/2009), 92 (12/09/2008)  Chol:127 (03/10/2009), 163 (12/09/2008)  Trig:18 (03/10/2009), 107 (12/09/2008)  Problem # 3:  HYPERTENSION (ICD-401.9) she has an aulscutory gap Her updated medication list for this problem includes:    Micardis 40 Mg Tabs (Telmisartan) ..... One by mouth daily    Cardizem Cd 240 Mg Cp24 (Diltiazem hcl coated beads) ..... Once daily    Metoprolol Tartrate 50 Mg Tabs (Metoprolol tartrate) .[name]..[name] Two times a day  BP today: 130/64 Prior BP: 128/70 (01/31/2009)  Prior 10 Yr Risk Heart Disease: 9 % (12/09/2008)  Labs Reviewed: K+: 3.8 (11/27/2008) Creat: : 0.7 (04/22/2008)   Chol: 127 (03/10/2009)   HDL: 62 (03/10/2009)   LDL: 62 (03/10/2009)   TG: 18 (03/10/2009)  Problem # 4:  DIABETES MELLITUS, TYPE II (ICD-250.00) stable Her updated medication list for this problem includes:    Micardis 40 Mg Tabs (Telmisartan) ..... One by mouth daily    Metformin Hcl 500 Mg Tb24 (Metformin hcl) .[name]... 2 two times a day    Actos 45 Mg Tabs (Pioglitazone hcl) .[name]... 1 once daily  Labs Reviewed: Creat: 0.7 (04/22/2008)    Reviewed HgBA1c results: 6.4 (03/04/2009)  7.5 (04/22/2008)  Complete Medication List: 1)  Evista 60 Mg Tabs (Raloxifene hcl) .... Once daily 2)  Micardis 40 Mg Tabs (Telmisartan) .... One by mouth daily 3)  Cardizem Cd 240 Mg Cp24 (Diltiazem hcl coated beads) .... Once daily 4)  Klor-con 8 Meq Tbcr (Potassium chloride) .... Once daily 5)  Caltrate 600+d 600-400 Mg-unit Tabs (Calcium carbonate-vitamin d) .[name].. 1 two times a day 6)  Magnesium Oxide 400 Mg Caps (Magnesium oxide) .... 2 once daily 7)  Reclast 5 Mg/126ml Soln (Zoledronic  acid) .... Yearly 8)  Metformin Hcl 500 Mg Tb24 (Metformin hcl) .... 2 two times a day 9)  Metoprolol Tartrate 50 Mg Tabs (Metoprolol tartrate) .... Two times a  day 10)  Warfarin Sodium 5 Mg Tabs (Warfarin sodium) .... 7.5 for 3 days and 5 all others 11)  Digoxin 0.125 Mg Tabs (Digoxin) .... Take 1 by mouth qd 12)  Alprazolam 0.25 Mg Tabs (Alprazolam) .... One by mouth q hs 13)  Truetest Test Strp (Glucose blood) .... Two times a day 14)  Onglyza 5 Mg  .... One by mouth daily 15)  Actos 45 Mg Tabs (Pioglitazone hcl) .[name].. 1 once daily 16)  Crestor 5 Mg Tabs (Rosuvastatin calcium) .[name].. 1 by mouth  mwf weekly 17)  Multivitamins Tabs (Multiple vitamin) .[name].. 1 once daily 18)  Vitamin C Cr 500 Mg Cr-caps (Ascorbic acid) .[name].. 1 once daily 19)  Vitamin D 2000 Unit Tabs (Cholecalciferol) .[name].. 1 once daily 20)  B Complex-b12 Tabs (B complex vitamins) .[name].. 1 once daily  Lipid Assessment/Plan:      Based on NCEP/ATP III, the patient's risk factor category is "history of diabetes".  The patient's lipid goals are as follows: Total cholesterol goal is 200; LDL cholesterol goal is 100; HDL cholesterol goal is 40; Triglyceride goal is 150.     Patient Instructions: 1)  Please schedule a follow-up appointment in 3 months. Prescriptions: ALPRAZOLAM 0.25 MG  TABS (ALPRAZOLAM) one by mouth q HS  #30 x 6   Entered by:   Willy Eddy, LPN   Authorized by:   Stacie Glaze MD   Signed by:   Willy Eddy, LPN on 09/81/1914   Method used:   Print then Give to Patient   RxID:   [id number] DIGOXIN 0.125 MG  TABS (DIGOXIN) take 1 by mouth qd  #90 x 3   Entered by:   Willy Eddy, LPN   Authorized by:   Stacie Glaze MD   Signed by:   Willy Eddy, LPN on 69/62/9528   Method used:   Print then Give to Patient   RxID:   [id number] METOPROLOL TARTRATE 50 MG TABS (METOPROLOL TARTRATE) two times a day  #60 x 6   Entered by:   Willy Eddy, LPN   Authorized by:   Stacie Glaze MD   Signed by:   Willy Eddy, LPN on 44/07/4740   Method used:   Print then Give to Patient   RxID:   [id number]

## 2010-06-23 NOTE — Letter (Signed)
Summary: [facility]  [facility]   Imported By: Maryln Gottron 10/31/2008 08:19:04  _____________________________________________________________________  External Attachment:    Type:   Image     Comment:   External Document

## 2010-06-23 NOTE — Miscellaneous (Signed)
  Clinical Lists Changes  Observations: Added new observation of PAST MED HX: ADENOCARCINOMA, BREAST, HX OF (ICD-V10.3) SYNCOPE, HX OF (ICD-V12.49 Nuclear stress... April, 2008... no ischemia... EF 65% EF  60%... echo.. February, 2008 FRACTURE, PELVIS, LEFT, HX OF (ICD-808.8) FRACTURE, FEMUR, RIGHT, HX OF (ICD-821.00) COUMADIN THERAPY (ICD-V58.61) ENCOUNTER FOR LONG-TERM USE OF ANTICOAGULANTS (ICD-V58.61) ATRIAL FIBRILLATION (ICD-427.31)...infrequent.. paroxysmal.. Coumadin therapy HYPERLIPIDEMIA, WITH HIGH HDL (ICD-272.4) ENCOUNTER FOR THERAPEUTIC DRUG MONITORING (ICD-V58.83) ACUTE SINUSITIS, UNSPECIFIED (ICD-461.9) URI (ICD-465.9) OSTEOPOROSIS (ICD-733.00) OSTEOARTHRITIS (ICD-715.90) HYPERTENSION (ICD-401.9) DIABETES MELLITUS, TYPE II (ICD-250.00) HYPERTENSION (ICD-401.9) DIABETES MELLITUS, TYPE II (ICD-250.00)   (04/04/2009 17:57)       Past History:  Past Medical History: ADENOCARCINOMA, BREAST, HX OF (ICD-V10.3) SYNCOPE, HX OF (ICD-V12.49 Nuclear stress... April, 2008... no ischemia... EF 65% EF  60%... echo.. February, 2008 FRACTURE, PELVIS, LEFT, HX OF (ICD-808.8) FRACTURE, FEMUR, RIGHT, HX OF (ICD-821.00) COUMADIN THERAPY (ICD-V58.61) ENCOUNTER FOR LONG-TERM USE OF ANTICOAGULANTS (ICD-V58.61) ATRIAL FIBRILLATION (ICD-427.31)...infrequent.. paroxysmal.. Coumadin therapy HYPERLIPIDEMIA, WITH HIGH HDL (ICD-272.4) ENCOUNTER FOR THERAPEUTIC DRUG MONITORING (ICD-V58.83) ACUTE SINUSITIS, UNSPECIFIED (ICD-461.9) URI (ICD-465.9) OSTEOPOROSIS (ICD-733.00) OSTEOARTHRITIS (ICD-715.90) HYPERTENSION (ICD-401.9) DIABETES MELLITUS, TYPE II (ICD-250.00) HYPERTENSION (ICD-401.9) DIABETES MELLITUS, TYPE II (ICD-250.00)

## 2010-06-23 NOTE — Assessment & Plan Note (Signed)
Summary: pt/njr   Nurse Visit   Allergies: 1)  ! Codeine 2)  Codeine Sulfate (Codeine Sulfate) 3)  Codeine Sulfate (Codeine Sulfate) 4)  Codeine Sulfate (Codeine Sulfate) Laboratory Results   Blood Tests     PT: 17.6 s   (Normal Range: 10.6-13.4)  INR: 2.0   (Normal Range: 0.88-1.12   Therap INR: 2.0-3.5) Comments: Rita Ohara  March 26, 2009 1:59 PM     Orders Added: 1)  Est. Patient Level I [99211] 2)  Protime [[id number]]   ANTICOAGULATION RECORD PREVIOUS REGIMEN & LAB RESULTS Anticoagulation Diagnosis:  v58.83,v58.61,427.31 on  05/31/2008 Previous INR Goal Range:  2.0-3.0 on  05/31/2008 Previous INR:  2.8 on  02/25/2009 Previous Coumadin Dose(mg):  7.5mg  on m,w,f 5mg  other days on  05/31/2008 Previous Regimen:  same on  01/31/2009 Previous Coagulation Comments:  Dr. Kirtland Bouchard approved. on  01/31/2009  NEW REGIMEN & LAB RESULTS Current INR: 2.0 Regimen: same  Repeat testing in: 1 month  Anticoagulation Visit Questionnaire Coumadin dose missed/changed:  No Abnormal Bleeding Symptoms:  No  Any diet changes including alcohol intake, vegetables or greens since the last visit:  No Any illnesses or hospitalizations since the last visit:  No Any signs of clotting since the last visit (including chest discomfort, dizziness, shortness of breath, arm tingling, slurred speech, swelling or redness in leg):  No  MEDICATIONS EVISTA 60 MG  TABS (RALOXIFENE HCL) once daily MICARDIS 40 MG TABS (TELMISARTAN) one by mouth daily CARDIZEM CD 240 MG  CP24 (DILTIAZEM HCL COATED BEADS) once daily KLOR-CON 8 MEQ  TBCR (POTASSIUM CHLORIDE) once daily CALTRATE 600+D 600-400 MG-UNIT TABS (CALCIUM CARBONATE-VITAMIN D) 1 two times a day MAGNESIUM OXIDE 400 MG  CAPS (MAGNESIUM OXIDE) 2 once daily RECLAST 5 MG/100ML  SOLN (ZOLEDRONIC ACID) yearly METFORMIN HCL 500 MG  TB24 (METFORMIN HCL) 2 two times a day METOPROLOL TARTRATE 50 MG TABS (METOPROLOL TARTRATE) two times a day WARFARIN  SODIUM 5 MG TABS (WARFARIN SODIUM) 7.5 for 3 days and 5 all others DIGOXIN 0.125 MG  TABS (DIGOXIN) take 1 by mouth qd ALPRAZOLAM 0.25 MG  TABS (ALPRAZOLAM) one by mouth q HS TRUETEST TEST   STRP (GLUCOSE BLOOD) two times a day * ONGLYZA 5 MG one by mouth daily ACTOS 45 MG TABS (PIOGLITAZONE HCL) 1 once daily CRESTOR 5 MG TABS (ROSUVASTATIN CALCIUM) 1 by mouth  MWF weekly MULTIVITAMINS  TABS (MULTIPLE VITAMIN) 1 once daily VITAMIN C CR 500 MG CR-CAPS (ASCORBIC ACID) 1 once daily VITAMIN D 2000 UNIT TABS (CHOLECALCIFEROL) 1 once daily B COMPLEX-B12  TABS (B COMPLEX VITAMINS) 1 once daily

## 2010-06-23 NOTE — Assessment & Plan Note (Signed)
Summary: 2 month rov/njr   Vital Signs:  Patient profile:   77 year old female Height:      64 inches Weight:      155 pounds Temp:     98.2 degrees F oral Pulse rate:   60 / minute Resp:     14 per minute BP sitting:   126 / 76  (left arm)  Vitals Entered By: Willy Eddy, LPN (September 09, 2008 9:32 AM)  CC:  roa.  Hypertension History:      She denies headache, chest pain, palpitations, dyspnea with exertion, orthopnea, PND, peripheral edema, visual symptoms, neurologic problems, syncope, and side effects from treatment.        Positive major cardiovascular risk factors include female age 2 years old or older, diabetes, hyperlipidemia, and hypertension.  Negative major cardiovascular risk factors include negative family history for ischemic heart disease and non-tobacco-user status.     Problems Prior to Update: 1)  Hyperlipidemia, With High Hdl  (ICD-272.4) 2)  Atrial Fibrillation  (ICD-427.31) 3)  Coumadin Therapy  (ICD-V58.61) 4)  Encounter For Therapeutic Drug Monitoring  (ICD-V58.83) 5)  Acute Sinusitis, Unspecified  (ICD-461.9) 6)  Uri  (ICD-465.9) 7)  Osteoporosis  (ICD-733.00) 8)  Osteoarthritis  (ICD-715.90) 9)  Hypertension  (ICD-401.9) 10)  Diabetes Mellitus, Type II  (ICD-250.00) 11)  Atrial Fibrillation  (ICD-427.31) 12)  Osteoporosis  (ICD-733.00) 13)  Hypertension  (ICD-401.9) 14)  Diabetes Mellitus, Type II  (ICD-250.00) 15)  Atrial Fibrillation  (ICD-427.31) 16)  Coumadin Therapy  (ICD-V58.61) 17)  Encounter For Therapeutic Drug Monitoring  (ICD-V58.83)  Medications Prior to Update: 1)  Evista 60 Mg  Tabs (Raloxifene Hcl) .... Once Daily 2)  Micardis 40 Mg Tabs (Telmisartan) .... One By Mouth Daily 3)  Cardizem Cd 240 Mg  Cp24 (Diltiazem Hcl Coated Beads) .... Once Daily 4)  Klor-Con 8 Meq  Tbcr (Potassium Chloride) .... Once Daily 5)  Oscal 500/200 D-3 500-200 Mg-Unit  Tabs (Calcium-Vitamin D) .[name].. 1650 Total; 6)  Magnesium Oxide 400 Mg  Caps  (Magnesium Oxide) .... 2 Once Daily 7)  Reclast 5 Mg/152ml  Soln (Zoledronic Acid) .... Yearly 8)  Metformin Hcl 500 Mg  Tb24 (Metformin Hcl) .... 2 Two Times A Day 9)  Metoprolol Tartrate 50 Mg Tabs (Metoprolol Tartrate) .... Two Times A Day 10)  Warfarin Sodium 5 Mg Tabs (Warfarin Sodium) .... 7.5 For 3 Days and 5 All Others 11)  Digoxin 0.125 Mg  Tabs (Digoxin) .... Take 1 By Mouth Qd 12)  Alprazolam 0.25 Mg  Tabs (Alprazolam) .... One By Mouth Q Hs 13)  Truetest Test   Strp (Glucose Blood) .... Two Times A Day 14)  Onglyza 5 Mg .... One By Mouth Daily 15)  Actos 45 Mg Tabs (Pioglitazone Hcl) .[name].. 1 Once Daily 16)  Crestor 5 Mg Tabs (Rosuvastatin Calcium) .[name].. 1 By Mouth  Mwf Weekly 17)  Multivitamins  Tabs (Multiple Vitamin) .[name].. 1 Once Daily 18)  Vitamin C Cr 500 Mg Cr-Caps (Ascorbic Acid) .[name].. 1 Once Daily  Current Medications (verified): 1)  Evista 60 Mg  Tabs (Raloxifene Hcl) .... Once Daily 2)  Micardis 40 Mg Tabs (Telmisartan) .... One By Mouth Daily 3)  Cardizem Cd 240 Mg  Cp24 (Diltiazem Hcl Coated Beads) .... Once Daily 4)  Klor-Con 8 Meq  Tbcr (Potassium Chloride) .... Once Daily 5)  Oscal 500/200 D-3 500-200 Mg-Unit  Tabs (Calcium-Vitamin D) .[name].. 1650 Total; 6)  Magnesium Oxide 400 Mg  Caps (Magnesium Oxide) .Marland KitchenMarland KitchenMarland Kitchen  2 Once Daily 7)  Reclast 5 Mg/140ml  Soln (Zoledronic Acid) .... Yearly 8)  Metformin Hcl 500 Mg  Tb24 (Metformin Hcl) .... 2 Two Times A Day 9)  Metoprolol Tartrate 50 Mg Tabs (Metoprolol Tartrate) .... Two Times A Day 10)  Warfarin Sodium 5 Mg Tabs (Warfarin Sodium) .... 7.5 For 3 Days and 5 All Others 11)  Digoxin 0.125 Mg  Tabs (Digoxin) .... Take 1 By Mouth Qd 12)  Alprazolam 0.25 Mg  Tabs (Alprazolam) .... One By Mouth Q Hs 13)  Truetest Test   Strp (Glucose Blood) .... Two Times A Day 14)  Onglyza 5 Mg .... One By Mouth Daily 15)  Actos 45 Mg Tabs (Pioglitazone Hcl) .[name].. 1 Once Daily 16)  Crestor 5 Mg Tabs (Rosuvastatin Calcium) .[name].. 1 By Mouth  Mwf  Weekly 17)  Multivitamins  Tabs (Multiple Vitamin) .[name].. 1 Once Daily 18)  Vitamin C Cr 500 Mg Cr-Caps (Ascorbic Acid) .[name].. 1 Once Daily  Allergies (verified): 1)  ! Codeine 2)  Codeine Sulfate (Codeine Sulfate) 3)  Codeine Sulfate (Codeine Sulfate) 4)  Codeine Sulfate (Codeine Sulfate)  Past History:  Family History:    mother CHF  at 67    father MI at 87 (05/08/2007)  Social History:    Married    Never Smoked    Alcohol use-no    Drug use-no    Regular exercise-yes     (05/08/2007)  Risk Factors:    Alcohol Use: N/A    >5 drinks/d w/in last 3 months: N/A    Caffeine Use: N/A    Diet: N/A    Exercise: yes (05/08/2007)  Risk Factors:    Smoking Status: never (05/08/2007)    Packs/Day: N/A    Cigars/wk: N/A    Pipe Use/wk: N/A    Cans of tobacco/wk: N/A    Passive Smoke Exposure: no (05/08/2007)  Past medical, surgical, family and social histories (including risk factors) reviewed, and no changes noted (except as noted below).  Past Medical History:    Reviewed history from 05/08/2007 and no changes required:    Atrial fibrillation    Diabetes mellitus, type II    Hypertension    Osteoarthritis    Osteoporosis  Past Surgical History:    Reviewed history from 05/08/2007 and no changes required:    Hysterectomy    Mastectomy    shoulder dislocation  Family History:    Reviewed history from 05/08/2007 and no changes required:       mother CHF  at 28       father MI at 81  Social History:    Reviewed history from 05/08/2007 and no changes required:       Married       Never Smoked       Alcohol use-no       Drug use-no       Regular exercise-yes  Review of Systems  The patient denies anorexia, fever, weight loss, weight gain, vision loss, decreased hearing, hoarseness, chest pain, syncope, dyspnea on exertion, peripheral edema, prolonged cough, headaches, hemoptysis, abdominal pain, melena, hematochezia, severe indigestion/heartburn, hematuria,  incontinence, genital sores, muscle weakness, suspicious skin lesions, transient blindness, difficulty walking, depression, unusual weight change, abnormal bleeding, enlarged lymph nodes, angioedema, and breast masses.    Physical Exam  General:  Well-developed,well-nourished,in no acute distress; alert,appropriate and cooperative throughout examination Eyes:  pupils equal and pupils round.   Ears:  R ear normal and L ear normal.   Nose:  no external deformity and nasal dischargemucosal pallor.   Mouth:  pharynx pink and moist and no erythema.   Neck:  No deformities, masses, or tenderness noted. Lungs:  Normal respiratory effort, chest expands symmetrically. Lungs are clear to auscultation, no crackles or wheezes. Heart:  no murmur, irregular rhythm, and Grade   2/6 systolic ejection murmur.   Abdomen:  Bowel sounds positive,abdomen soft and non-tender without masses, organomegaly or hernias noted.   Impression & Recommendations:  Problem # 1:  ATRIAL FIBRILLATION (ICD-427.31)  rate stasble Her updated medication list for this problem includes:    Cardizem Cd 240 Mg Cp24 (Diltiazem hcl coated beads) ..... Once daily    Metoprolol Tartrate 50 Mg Tabs (Metoprolol tartrate) .[name]..[name] Two times a day    Warfarin Sodium 5 Mg Tabs (Warfarin sodium) .[name]... 7.5 for 3 days and 5 all others    Digoxin 0.125 Mg Tabs (Digoxin) .[name]... Take 1 by mouth qd  Reviewed the following: PT: 15.6 (08/19/2008)   INR: 1.6 (08/19/2008) Next Protime: 4 weeks (dated on 08/19/2008)  Problem # 2:  HYPERTENSION (ICD-401.9)  Her updated medication list for this problem includes:    Micardis 40 Mg Tabs (Telmisartan) ..... One by mouth daily    Cardizem Cd 240 Mg Cp24 (Diltiazem hcl coated beads) ..... Once daily    Metoprolol Tartrate 50 Mg Tabs (Metoprolol tartrate) .[name]..[name] Two times a day  BP today: 126/76 Prior BP: 119/51 (08/19/2008)  Prior 10 Yr Risk Heart Disease: Not enough information (01/09/2008)  Labs  Reviewed: K+: 4.6 (04/22/2008) Creat: : 0.7 (04/22/2008)   Chol: 206 (01/02/2008)   HDL: 47.4 (01/02/2008)   LDL: DEL (01/02/2008)   TG: 188 (01/02/2008)  Problem # 3:  OSTEOARTHRITIS (ICD-715.90)  Discussed use of medications, application of heat or cold, and exercises.   Problem # 4:  DIABETES MELLITUS, TYPE II (ICD-250.00)  Her updated medication list for this problem includes:    Micardis 40 Mg Tabs (Telmisartan) ..... One by mouth daily    Metformin Hcl 500 Mg Tb24 (Metformin hcl) .[name]... 2 two times a day    Actos 45 Mg Tabs (Pioglitazone hcl) .[name]... 1 once daily  Labs Reviewed: Creat: 0.7 (04/22/2008)    Reviewed HgBA1c results: 7.5 (04/22/2008)  8.0 (02/14/2008)  Problem # 5:  HYPERLIPIDEMIA, WITH HIGH HDL (ICD-272.4) pulse Her updated medication list for this problem includes:    Crestor 5 Mg Tabs (Rosuvastatin calcium) .[name]... 1 by mouth  mwf weekly  Labs Reviewed: SGOT: 28 (01/02/2008)   SGPT: 18 (01/02/2008)  Prior 10 Yr Risk Heart Disease: Not enough information (01/09/2008)   HDL:47.4 (01/02/2008)  LDL:DEL (01/02/2008)  Chol:206 (01/02/2008)  Trig:188 (01/02/2008)  Complete Medication List: 1)  Evista 60 Mg Tabs (Raloxifene hcl) .... Once daily 2)  Micardis 40 Mg Tabs (Telmisartan) .... One by mouth daily 3)  Cardizem Cd 240 Mg Cp24 (Diltiazem hcl coated beads) .... Once daily 4)  Klor-con 8 Meq Tbcr (Potassium chloride) .... Once daily 5)  Oscal 500/200 D-3 500-200 Mg-unit Tabs (Calcium-vitamin d) .[name].. 1650 total; 6)  Magnesium Oxide 400 Mg Caps (Magnesium oxide) .... 2 once daily 7)  Reclast 5 Mg/153ml Soln (Zoledronic acid) .... Yearly 8)  Metformin Hcl 500 Mg Tb24 (Metformin hcl) .... 2 two times a day 9)  Metoprolol Tartrate 50 Mg Tabs (Metoprolol tartrate) .... Two times a day 10)  Warfarin Sodium 5 Mg Tabs (Warfarin sodium) .... 7.5 for 3 days and 5 all others 11)  Digoxin 0.125 Mg Tabs (Digoxin) .Marland KitchenMarland KitchenMarland Kitchen  Take 1 by mouth qd 12)  Alprazolam 0.25 Mg Tabs  (Alprazolam) .... One by mouth q hs 13)  Truetest Test Strp (Glucose blood) .... Two times a day 14)  Onglyza 5 Mg  .... One by mouth daily 15)  Actos 45 Mg Tabs (Pioglitazone hcl) .[name].. 1 once daily 16)  Crestor 5 Mg Tabs (Rosuvastatin calcium) .[name].. 1 by mouth  mwf weekly 17)  Multivitamins Tabs (Multiple vitamin) .[name].. 1 once daily 18)  Vitamin C Cr 500 Mg Cr-caps (Ascorbic acid) .[name].. 1 once daily  Hypertension Assessment/Plan:      The patient's hypertensive risk group is category C: Target organ damage and/or diabetes.  Today's blood pressure is 126/76.  Her blood pressure goal is < 130/80.  Patient Instructions: 1)  Please schedule a follow-up appointment in 3 months.

## 2010-06-23 NOTE — Assessment & Plan Note (Signed)
Summary: pt/njr   Nurse Visit   Allergies: 1)  ! Codeine 2)  Codeine Sulfate (Codeine Sulfate) 3)  Codeine Sulfate (Codeine Sulfate) 4)  Codeine Sulfate (Codeine Sulfate) Laboratory Results   Blood Tests     PT: 19.8 s   (Normal Range: 10.6-13.4)  INR: 2.7   (Normal Range: 0.88-1.12   Therap INR: 2.0-3.5) Comments: Rita Ohara  Oct 01, 2009 3:35 PM     Orders Added: 1)  Est. Patient Level I [99211] 2)  Protime [[id number]]   ANTICOAGULATION RECORD PREVIOUS REGIMEN & LAB RESULTS Anticoagulation Diagnosis:  v58.83,v58.61,427.31 on  05/31/2008 Previous INR Goal Range:  2.0-3.0 on  05/31/2008 Previous INR:  2.5 on  09/03/2009 Previous Coumadin Dose(mg):  7.5mg ,10mg  alt. on  08/08/2009 Previous Regimen:  7.5mg ,10mg  alt. on  08/08/2009 Previous Coagulation Comments:  Dr. Kirtland Bouchard approved. on  01/31/2009  NEW REGIMEN & LAB RESULTS Current INR: 2.7 Regimen: same  Repeat testing in: 4 weeks  Anticoagulation Visit Questionnaire Coumadin dose missed/changed:  No Abnormal Bleeding Symptoms:  No  Any diet changes including alcohol intake, vegetables or greens since the last visit:  No Any illnesses or hospitalizations since the last visit:  No Any signs of clotting since the last visit (including chest discomfort, dizziness, shortness of breath, arm tingling, slurred speech, swelling or redness in leg):  Yes  MEDICATIONS EVISTA 60 MG  TABS (RALOXIFENE HCL) once daily MICARDIS 40 MG TABS (TELMISARTAN) one by mouth daily CARDIZEM CD 240 MG  CP24 (DILTIAZEM HCL COATED BEADS) once daily KLOR-CON 8 MEQ  TBCR (POTASSIUM CHLORIDE) once daily CALTRATE 600+D 600-400 MG-UNIT TABS (CALCIUM CARBONATE-VITAMIN D) 1 two times a day MAGNESIUM 250 MG TABS (MAGNESIUM) three times a day RECLAST 5 MG/100ML  SOLN (ZOLEDRONIC ACID) yearly METFORMIN HCL 500 MG  TB24 (METFORMIN HCL) 2 tabs two times a day WARFARIN SODIUM 5 MG TABS (WARFARIN SODIUM) 7.5, daily DIGOXIN 0.125 MG  TABS (DIGOXIN) take  1 by mouth qd ALPRAZOLAM 0.25 MG  TABS (ALPRAZOLAM) one by mouth q HS TRUETEST TEST   STRP (GLUCOSE BLOOD) two times a day * ONGLYZA 5 MG one by mouth daily ACTOS 45 MG TABS (PIOGLITAZONE HCL) 1 once daily CRESTOR 5 MG TABS (ROSUVASTATIN CALCIUM) 1 by mouth  MWF weekly MULTIVITAMINS  TABS (MULTIPLE VITAMIN) 1 once daily VITAMIN C CR 500 MG CR-CAPS (ASCORBIC ACID) 1 once daily VITAMIN D 2000 UNIT TABS (CHOLECALCIFEROL) 1 once daily B COMPLEX-B12  TABS (B COMPLEX VITAMINS) 1 once daily

## 2010-06-23 NOTE — Progress Notes (Signed)
Summary: WHAT CAN SHE TAKE WITH WARFARIN   Phone Note Call from Patient Call back at Home Phone [phone]   Caller: PT VM TRIAGE Call For: JENKINS Summary of Call: TAKING WARFARIN HAS SPRAINED HER LEG AND THE DR WANTS HER TO TAKE SOMETHING FOR THE PAIN AND SWELLING.  SHE CANNOT TAKE ALEVE.  OTHER DR RECOMMENDED CELEBREX BUT SHE HAS READ THAT SHE SHOULD NOT TAKE THAT WITH WARFARIN EITHER.  WHAT CAN SHE TAKE  Initial call taken by: Roselle Locus,  September 15, 2007 3:11 PM  Follow-up for Phone Call        Tylenol two tablets 4 times a day, elevation and ice Follow-up by: Roderick Pee MD,  September 15, 2007 4:39 PM  Additional Follow-up for Phone Call Additional follow up Details #1::        Pt. notified. Additional Follow-up by: Lynann [name] CMA,  September 15, 2007 4:44 PM

## 2010-06-23 NOTE — Letter (Signed)
Summary: [facility]   Imported By: Maryln Gottron 07/19/2008 13:10:31  _____________________________________________________________________  External Attachment:    Type:   Image     Comment:   External Document

## 2010-06-23 NOTE — Consult Note (Signed)
Summary: [facility] Diabetes  [facility]   Imported By: Maryln Gottron 06/14/2008 14:36:11  _____________________________________________________________________  External Attachment:    Type:   Image     Comment:   External Document

## 2010-06-23 NOTE — Assessment & Plan Note (Signed)
Summary: pt/njr  - Nurse Visit   Vital Signs:  Patient Profile:   77 Years Old Female Height:     64 inches Pulse rate:   60 / minute BP sitting:   120 / 72  (left arm)                 Prior Medications: EVISTA 60 MG  TABS (RALOXIFENE HCL) once daily MICARDIS HCT 40-12.5 MG  TABS (TELMISARTAN-HCTZ) 1/2 once daily CARDIZEM CD 240 MG  CP24 (DILTIAZEM HCL COATED BEADS) once daily KLOR-CON 8 MEQ  TBCR (POTASSIUM CHLORIDE) once daily OSCAL 500/200 D-3 500-200 MG-UNIT  TABS (CALCIUM-VITAMIN D) 1650 TOTAL; MAGNESIUM OXIDE 400 MG  CAPS (MAGNESIUM OXIDE) 2 once daily RECLAST 5 MG/100ML  SOLN (ZOLEDRONIC ACID) yearly METFORMIN HCL 500 MG  TB24 (METFORMIN HCL) one by mouth  bid METOPROLOL TARTRATE 50 MG TABS (METOPROLOL TARTRATE) two times a day WARFARIN SODIUM 5 MG TABS (WARFARIN SODIUM) 5,7 1/2 DIGOXIN 0.125 MG  TABS (DIGOXIN) take 1 by mouth qd ALPRAZOLAM 0.25 MG  TABS (ALPRAZOLAM) one by mouth q HS TRUETEST TEST   STRP (GLUCOSE BLOOD) two times a day ONGLYZA 5 MG () one by mouth daily Current Allergies: ! CODEINE CODEINE SULFATE (CODEINE SULFATE) CODEINE SULFATE (CODEINE SULFATE) CODEINE SULFATE (CODEINE SULFATE) Laboratory Results   Blood Tests    Date/Time Reported: March 01, 2008 2:56 PM   PT: 16.7 s   (Normal Range: 10.6-13.4)  INR: 1.8   (Normal Range: 0.88-1.12   Therap INR: 2.0-3.5) Comments: Rita Ohara  March 01, 2008 2:56 PM       Orders Added: 1)  Est. Patient Level I [99211] 2)  Fingerstick [36416] 3)  Protime Ila.[email or web address]    ]    ANTICOAGULATION RECORD PREVIOUS REGIMEN & LAB RESULTS Anticoagulation Diagnosis:  V58083,V58.61427.31 on  12/26/2006 Previous INR Goal Range:  2.0-3.0 on  12/26/2006 Previous INR:  1.2 on  02/02/2008 Previous Coumadin Dose(mg):  7.5 on m,w,f/5mg  others on  11/07/2007 Previous Regimen:  7.5mg . for 4 days then resume on  02/02/2008 Previous Coagulation Comments:  Dr. Kirtland Bouchard approved on  02/02/2008  NEW REGIMEN &  LAB RESULTS Current INR: 1.8 Regimen: same  Repeat testing in: 4 weeks  Anticoagulation Visit Questionnaire Coumadin dose missed/changed:  No Abnormal Bleeding Symptoms:  No  Any diet changes including alcohol intake, vegetables or greens since the last visit:  No Any illnesses or hospitalizations since the last visit:  No Any signs of clotting since the last visit (including chest discomfort, dizziness, shortness of breath, arm tingling, slurred speech, swelling or redness in leg):  No  MEDICATIONS EVISTA 60 MG  TABS (RALOXIFENE HCL) once daily MICARDIS HCT 40-12.5 MG  TABS (TELMISARTAN-HCTZ) 1/2 once daily CARDIZEM CD 240 MG  CP24 (DILTIAZEM HCL COATED BEADS) once daily KLOR-CON 8 MEQ  TBCR (POTASSIUM CHLORIDE) once daily OSCAL 500/200 D-3 500-200 MG-UNIT  TABS (CALCIUM-VITAMIN D) 1650 TOTAL; MAGNESIUM OXIDE 400 MG  CAPS (MAGNESIUM OXIDE) 2 once daily RECLAST 5 MG/100ML  SOLN (ZOLEDRONIC ACID) yearly METFORMIN HCL 500 MG  TB24 (METFORMIN HCL) one by mouth  bid METOPROLOL TARTRATE 50 MG TABS (METOPROLOL TARTRATE) two times a day WARFARIN SODIUM 5 MG TABS (WARFARIN SODIUM) 5,7 1/2 DIGOXIN 0.125 MG  TABS (DIGOXIN) take 1 by mouth qd ALPRAZOLAM 0.25 MG  TABS (ALPRAZOLAM) one by mouth q HS TRUETEST TEST   STRP (GLUCOSE BLOOD) two times a day * ONGLYZA 5 MG one by mouth daily

## 2010-06-23 NOTE — Assessment & Plan Note (Signed)
Summary: 3 month rov/njr   Vital Signs:  Patient profile:   77 year old female Height:      64 inches Weight:      156 pounds Temp:     98.2 degrees F oral Pulse rate:   64 / minute Resp:     14 per minute BP sitting:   134 / 76  (left arm)  Vitals Entered By: Willy Eddy, LPN (December 09, 2008 11:07 AM)  CC:  roa and Type 2 diabetes mellitus follow-up.  History of Present Illness:  Type 2 Diabetes Mellitus Follow-Up      This is an 77 year old woman who presents for Type 2 diabetes mellitus follow-up.  The patient denies polyuria, polydipsia, blurred vision, self managed hypoglycemia, hypoglycemia requiring help, weight loss, weight gain, and numbness of extremities.  The patient denies the following symptoms: neuropathic pain, chest pain, vomiting, orthostatic symptoms, poor wound healing, intermittent claudication, vision loss, and foot ulcer.  Since the last visit the patient reports good dietary compliance.  The patient has been measuring capillary blood glucose before breakfast.  Since the last visit, the patient reports having had eye care by a retinal specialist.    Hypertension History:      She denies headache, chest pain, palpitations, dyspnea with exertion, orthopnea, PND, peripheral edema, visual symptoms, neurologic problems, syncope, and side effects from treatment.        Positive major cardiovascular risk factors include female age 75 years old or older, diabetes, hyperlipidemia, and hypertension.  Negative major cardiovascular risk factors include negative family history for ischemic heart disease and non-tobacco-user status.     Problems Prior to Update: 1)  Coumadin Therapy  (ICD-V58.61) 2)  Encounter For Long-term Use of Anticoagulants  (ICD-V58.61) 3)  Atrial Fibrillation  (ICD-427.31) 4)  Hyperlipidemia, With High Hdl  (ICD-272.4) 5)  Atrial Fibrillation  (ICD-427.31) 6)  Coumadin Therapy  (ICD-V58.61) 7)  Encounter For Therapeutic Drug Monitoring   (ICD-V58.83) 8)  Acute Sinusitis, Unspecified  (ICD-461.9) 9)  Uri  (ICD-465.9) 10)  Osteoporosis  (ICD-733.00) 11)  Osteoarthritis  (ICD-715.90) 12)  Hypertension  (ICD-401.9) 13)  Diabetes Mellitus, Type II  (ICD-250.00) 14)  Atrial Fibrillation  (ICD-427.31) 15)  Osteoporosis  (ICD-733.00) 16)  Hypertension  (ICD-401.9) 17)  Diabetes Mellitus, Type II  (ICD-250.00) 18)  Atrial Fibrillation  (ICD-427.31) 19)  Coumadin Therapy  (ICD-V58.61) 20)  Encounter For Therapeutic Drug Monitoring  (ICD-V58.83)  Medications Prior to Update: 1)  Evista 60 Mg  Tabs (Raloxifene Hcl) .... Once Daily 2)  Micardis 40 Mg Tabs (Telmisartan) .... One By Mouth Daily 3)  Cardizem Cd 240 Mg  Cp24 (Diltiazem Hcl Coated Beads) .... Once Daily 4)  Klor-Con 8 Meq  Tbcr (Potassium Chloride) .... Once Daily 5)  Oscal 500/200 D-3 500-200 Mg-Unit  Tabs (Calcium-Vitamin D) .[name].. 1650 Total; 6)  Magnesium Oxide 400 Mg  Caps (Magnesium Oxide) .... 2 Once Daily 7)  Reclast 5 Mg/174ml  Soln (Zoledronic Acid) .... Yearly 8)  Metformin Hcl 500 Mg  Tb24 (Metformin Hcl) .... 2 Two Times A Day 9)  Metoprolol Tartrate 50 Mg Tabs (Metoprolol Tartrate) .... Two Times A Day 10)  Warfarin Sodium 5 Mg Tabs (Warfarin Sodium) .... 7.5 For 3 Days and 5 All Others 11)  Digoxin 0.125 Mg  Tabs (Digoxin) .... Take 1 By Mouth Qd 12)  Alprazolam 0.25 Mg  Tabs (Alprazolam) .... One By Mouth Q Hs 13)  Truetest Test   Strp (Glucose Blood) .Marland KitchenMarland KitchenMarland Kitchen  Two Times A Day 14)  Onglyza 5 Mg .... One By Mouth Daily 15)  Actos 45 Mg Tabs (Pioglitazone Hcl) .[name].. 1 Once Daily 16)  Crestor 5 Mg Tabs (Rosuvastatin Calcium) .[name].. 1 By Mouth  Mwf Weekly 17)  Multivitamins  Tabs (Multiple Vitamin) .[name].. 1 Once Daily 18)  Vitamin C Cr 500 Mg Cr-Caps (Ascorbic Acid) .[name].. 1 Once Daily  Current Medications (verified): 1)  Evista 60 Mg  Tabs (Raloxifene Hcl) .... Once Daily 2)  Micardis 40 Mg Tabs (Telmisartan) .... One By Mouth Daily 3)  Cardizem Cd 240 Mg  Cp24  (Diltiazem Hcl Coated Beads) .... Once Daily 4)  Klor-Con 8 Meq  Tbcr (Potassium Chloride) .... Once Daily 5)  Caltrate 600+d 600-400 Mg-Unit Tabs (Calcium Carbonate-Vitamin D) .[name].. 1 Two Times A Day 6)  Magnesium Oxide 400 Mg  Caps (Magnesium Oxide) .... 2 Once Daily 7)  Reclast 5 Mg/157ml  Soln (Zoledronic Acid) .... Yearly 8)  Metformin Hcl 500 Mg  Tb24 (Metformin Hcl) .... 2 Two Times A Day 9)  Metoprolol Tartrate 50 Mg Tabs (Metoprolol Tartrate) .... Two Times A Day 10)  Warfarin Sodium 5 Mg Tabs (Warfarin Sodium) .... 7.5 For 3 Days and 5 All Others 11)  Digoxin 0.125 Mg  Tabs (Digoxin) .... Take 1 By Mouth Qd 12)  Alprazolam 0.25 Mg  Tabs (Alprazolam) .... One By Mouth Q Hs 13)  Truetest Test   Strp (Glucose Blood) .... Two Times A Day 14)  Onglyza 5 Mg .... One By Mouth Daily 15)  Actos 45 Mg Tabs (Pioglitazone Hcl) .[name].. 1 Once Daily 16)  Crestor 5 Mg Tabs (Rosuvastatin Calcium) .[name].. 1 By Mouth  Mwf Weekly 17)  Multivitamins  Tabs (Multiple Vitamin) .[name].. 1 Once Daily 18)  Vitamin C Cr 500 Mg Cr-Caps (Ascorbic Acid) .[name].. 1 Once Daily  Allergies (verified): 1)  ! Codeine 2)  Codeine Sulfate (Codeine Sulfate) 3)  Codeine Sulfate (Codeine Sulfate) 4)  Codeine Sulfate (Codeine Sulfate)  Past History:  Family History: Last updated: 05/08/2007 mother CHF  at 56 father MI at 24  Social History: Last updated: 05/08/2007 Married Never Smoked Alcohol use-no Drug use-no Regular exercise-yes  Risk Factors: Exercise: yes (05/08/2007)  Risk Factors: Smoking Status: never (05/08/2007) Passive Smoke Exposure: no (05/08/2007)  Past medical, surgical, family and social histories (including risk factors) reviewed, and no changes noted (except as noted below).  Past Medical History: Reviewed history from 05/08/2007 and no changes required. Atrial fibrillation Diabetes mellitus, type II Hypertension Osteoarthritis Osteoporosis  Past Surgical History: Reviewed history from  05/08/2007 and no changes required. Hysterectomy Mastectomy shoulder dislocation  Family History: Reviewed history from 05/08/2007 and no changes required. mother CHF  at 89 father MI at 4  Social History: Reviewed history from 05/08/2007 and no changes required. Married Never Smoked Alcohol use-no Drug use-no Regular exercise-yes  Review of Systems  The patient denies anorexia, fever, weight loss, weight gain, vision loss, decreased hearing, hoarseness, chest pain, syncope, dyspnea on exertion, peripheral edema, prolonged cough, headaches, hemoptysis, abdominal pain, melena, hematochezia, severe indigestion/heartburn, hematuria, incontinence, genital sores, muscle weakness, suspicious skin lesions, transient blindness, difficulty walking, depression, unusual weight change, abnormal bleeding, enlarged lymph nodes, angioedema, and breast masses.    Physical Exam  General:  Well-developed,well-nourished,in no acute distress; alert,appropriate and cooperative throughout examination Head:  Normocephalic and atraumatic without obvious abnormalities. No apparent alopecia or balding. Eyes:  pupils equal and pupils round.   Nose:  no external deformity and nasal dischargemucosal pallor.  Mouth:  pharynx pink and moist and no erythema.   Neck:  No deformities, masses, or tenderness noted. Lungs:  Normal respiratory effort, chest expands symmetrically. Lungs are clear to auscultation, no crackles or wheezes. Heart:  no murmur, irregular rhythm, and Grade   2/6 systolic ejection murmur.   Abdomen:  Bowel sounds positive,abdomen soft and non-tender without masses, organomegaly or hernias noted.   Impression & Recommendations:  Problem # 1:  HYPERLIPIDEMIA, WITH HIGH HDL (ICD-272.4)  Her updated medication list for this problem includes:    Crestor 5 Mg Tabs (Rosuvastatin calcium) .[name]... 1 by mouth  mwf weekly  Labs Reviewed: SGOT: 28 (01/02/2008)   SGPT: 18 (01/02/2008)  Prior 10 Yr  Risk Heart Disease: Not enough information (01/09/2008)   HDL:47.4 (01/02/2008)  LDL:DEL (01/02/2008)  Chol:206 (01/02/2008)  Trig:188 (01/02/2008)  Problem # 2:  DIABETES MELLITUS, TYPE II (ICD-250.00) Assessment: Unchanged  Her updated medication list for this problem includes:    Micardis 40 Mg Tabs (Telmisartan) ..... One by mouth daily    Metformin Hcl 500 Mg Tb24 (Metformin hcl) .[name]... 2 two times a day    Actos 45 Mg Tabs (Pioglitazone hcl) .[name]... 1 once daily  Labs Reviewed: Creat: 0.7 (04/22/2008)    Reviewed HgBA1c results: 7.5 (04/22/2008)  8.0 (02/14/2008)  Complete Medication List: 1)  Evista 60 Mg Tabs (Raloxifene hcl) .... Once daily 2)  Micardis 40 Mg Tabs (Telmisartan) .... One by mouth daily 3)  Cardizem Cd 240 Mg Cp24 (Diltiazem hcl coated beads) .... Once daily 4)  Klor-con 8 Meq Tbcr (Potassium chloride) .... Once daily 5)  Caltrate 600+d 600-400 Mg-unit Tabs (Calcium carbonate-vitamin d) .[name].. 1 two times a day 6)  Magnesium Oxide 400 Mg Caps (Magnesium oxide) .... 2 once daily 7)  Reclast 5 Mg/111ml Soln (Zoledronic acid) .... Yearly 8)  Metformin Hcl 500 Mg Tb24 (Metformin hcl) .... 2 two times a day 9)  Metoprolol Tartrate 50 Mg Tabs (Metoprolol tartrate) .... Two times a day 10)  Warfarin Sodium 5 Mg Tabs (Warfarin sodium) .... 7.5 for 3 days and 5 all others 11)  Digoxin 0.125 Mg Tabs (Digoxin) .... Take 1 by mouth qd 12)  Alprazolam 0.25 Mg Tabs (Alprazolam) .... One by mouth q hs 13)  Truetest Test Strp (Glucose blood) .... Two times a day 14)  Onglyza 5 Mg  .... One by mouth daily 15)  Actos 45 Mg Tabs (Pioglitazone hcl) .[name].. 1 once daily 16)  Crestor 5 Mg Tabs (Rosuvastatin calcium) .[name].. 1 by mouth  mwf weekly 17)  Multivitamins Tabs (Multiple vitamin) .[name].. 1 once daily 18)  Vitamin C Cr 500 Mg Cr-caps (Ascorbic acid) .[name].. 1 once daily  Other Orders: Venipuncture (16109) T-Vitamin D (25-Hydroxy) (60454-09811)  Hypertension Assessment/Plan:      The  patient's hypertensive risk group is category C: Target organ damage and/or diabetes.  Her calculated 10 year risk of coronary heart disease is 9 %.  Today's blood pressure is 134/76.  Her blood pressure goal is < 130/80.  Patient Instructions: 1)  Please schedule a follow-up appointment in 3 months. Prescriptions: CRESTOR 5 MG TABS (ROSUVASTATIN CALCIUM) 1 by mouth  MWF weekly  #90 x 3   Entered and Authorized by:   Stacie Glaze MD   Signed by:   Stacie Glaze MD on 12/09/2008   Method used:   Print then Give to Patient   RxID:   [id number] ACTOS 45 MG TABS (PIOGLITAZONE HCL) 1 once daily  #90  x 3   Entered and Authorized by:   Stacie Glaze MD   Signed by:   Stacie Glaze MD on 12/09/2008   Method used:   Print then Give to Patient   RxID:   [id number] ONGLYZA 5 MG one by mouth daily  #90 x 3   Entered and Authorized by:   Stacie Glaze MD   Signed by:   Stacie Glaze MD on 12/09/2008   Method used:   Print then Give to Patient   RxID:   [id number] MICARDIS 40 MG TABS (TELMISARTAN) one by mouth daily  #90 x 3   Entered and Authorized by:   Stacie Glaze MD   Signed by:   Stacie Glaze MD on 12/09/2008   Method used:   Print then Give to Patient   RxID:   [id number] EVISTA 60 MG  TABS (RALOXIFENE HCL) once daily  #90 x 3   Entered and Authorized by:   Stacie Glaze MD   Signed by:   Stacie Glaze MD on 12/09/2008   Method used:   Print then Give to Patient   RxID:   [id number] CRESTOR 5 MG TABS (ROSUVASTATIN CALCIUM) 1 by mouth  MWF weekly  #30 x 11   Entered and Authorized by:   Stacie Glaze MD   Signed by:   Stacie Glaze MD on 12/09/2008   Method used:   Print then Give to Patient   RxID:   [id number] ONGLYZA 5 MG one by mouth daily  #30 x 11   Entered and Authorized by:   Stacie Glaze MD   Signed by:   Stacie Glaze MD on 12/09/2008   Method used:   Print then Give to Patient   RxID:   [id number] ACTOS 45 MG TABS  (PIOGLITAZONE HCL) 1 once daily  #30 x 11   Entered and Authorized by:   Stacie Glaze MD   Signed by:   Stacie Glaze MD on 12/09/2008   Method used:   Print then Give to Patient   RxID:   [id number] EVISTA 60 MG  TABS (RALOXIFENE HCL) once daily  #30 Tablet x 5   Entered and Authorized by:   Stacie Glaze MD   Signed by:   Stacie Glaze MD on 12/09/2008   Method used:   Print then Give to Patient   RxID:   [id number] MICARDIS 40 MG TABS (TELMISARTAN) one by mouth daily  #30 x 11   Entered and Authorized by:   Stacie Glaze MD   Signed by:   Stacie Glaze MD on 12/09/2008   Method used:   Print then Give to Patient   RxID:   [id number]

## 2010-06-23 NOTE — Assessment & Plan Note (Signed)
Summary: pt/njr/pt rescd//ccm   Nurse Visit   Vital Signs:  Patient profile:   77 year old female Pulse rate:   62 / minute BP sitting:   119 / 51    Allergies: 1)  ! Codeine 2)  Codeine Sulfate (Codeine Sulfate) 3)  Codeine Sulfate (Codeine Sulfate) 4)  Codeine Sulfate (Codeine Sulfate)  Laboratory Results   Blood Tests     PT: 15.6 s   (Normal Range: 10.6-13.4)  INR: 1.6   (Normal Range: 0.88-1.12   Therap INR: 2.0-3.5) Comments: Rita Ohara  August 19, 2008 3:01 PM       Orders Added: 1)  Est. Patient Level I [99211] 2)  Fingerstick [36416] 3)  Protime Ila.Stager    ]  ANTICOAGULATION RECORD PREVIOUS REGIMEN & LAB RESULTS Anticoagulation Diagnosis:  v58.83,v58.61,427.31 on  05/31/2008 Previous INR Goal Range:  2.0-3.0 on  05/31/2008 Previous INR:  1.6 on  07/19/2008 Previous Coumadin Dose(mg):  7.5mg  on m,w,f 5mg  other days on  05/31/2008 Previous Regimen:  same dose on  07/19/2008 Previous Coagulation Comments:  Dr. Kirtland Bouchard approved on  02/02/2008  NEW REGIMEN & LAB RESULTS Current INR: 1.6 Regimen: 7.5mg . Tues. Thurs. Sat. Sun. others 5mg .  Repeat testing in: 4 weeks  Anticoagulation Visit Questionnaire Coumadin dose missed/changed:  No Abnormal Bleeding Symptoms:  No  Any diet changes including alcohol intake, vegetables or greens since the last visit:  No Any illnesses or hospitalizations since the last visit:  No Any signs of clotting since the last visit (including chest discomfort, dizziness, shortness of breath, arm tingling, slurred speech, swelling or redness in leg):  No  MEDICATIONS EVISTA 60 MG  TABS (RALOXIFENE HCL) once daily MICARDIS 40 MG TABS (TELMISARTAN) one by mouth daily CARDIZEM CD 240 MG  CP24 (DILTIAZEM HCL COATED BEADS) once daily KLOR-CON 8 MEQ  TBCR (POTASSIUM CHLORIDE) once daily OSCAL 500/200 D-3 500-200 MG-UNIT  TABS (CALCIUM-VITAMIN D) 1650 TOTAL; MAGNESIUM OXIDE 400 MG  CAPS (MAGNESIUM OXIDE) 2 once daily RECLAST  5 MG/100ML  SOLN (ZOLEDRONIC ACID) yearly METFORMIN HCL 500 MG  TB24 (METFORMIN HCL) 2 two times a day METOPROLOL TARTRATE 50 MG TABS (METOPROLOL TARTRATE) two times a day WARFARIN SODIUM 5 MG TABS (WARFARIN SODIUM) 7.5 for 3 days and 5 all others DIGOXIN 0.125 MG  TABS (DIGOXIN) take 1 by mouth qd ALPRAZOLAM 0.25 MG  TABS (ALPRAZOLAM) one by mouth q HS TRUETEST TEST   STRP (GLUCOSE BLOOD) two times a day * ONGLYZA 5 MG one by mouth daily ACTOS 45 MG TABS (PIOGLITAZONE HCL) 1 once daily CRESTOR 5 MG TABS (ROSUVASTATIN CALCIUM) 1 by mouth  MWF weekly MULTIVITAMINS  TABS (MULTIPLE VITAMIN) 1 once daily VITAMIN C CR 500 MG CR-CAPS (ASCORBIC ACID) 1 once daily      Vital Signs:  Patient Profile:   77 year old female Height:     64 inches Pulse rate:   62 / minute BP sitting:   119 / 51  (left arm)

## 2010-06-23 NOTE — Assessment & Plan Note (Signed)
Summary: pt/njr   Nurse Visit   Vital Signs:  Patient Profile:   77 Years Old Female Height:     64 inches Pulse rate:   52 / minute BP sitting:   117 / 72  (right arm)                 Prior Medications: EVISTA 60 MG  TABS (RALOXIFENE HCL) once daily MICARDIS HCT 40-12.5 MG  TABS (TELMISARTAN-HCTZ) 1/2 once daily CARDIZEM CD 240 MG  CP24 (DILTIAZEM HCL COATED BEADS) once daily KLOR-CON 8 MEQ  TBCR (POTASSIUM CHLORIDE) once daily OSCAL 500/200 D-3 500-200 MG-UNIT  TABS (CALCIUM-VITAMIN D) 1650 TOTAL; MAGNESIUM OXIDE 400 MG  CAPS (MAGNESIUM OXIDE) 2 once daily RECLAST 5 MG/100ML  SOLN (ZOLEDRONIC ACID) yearly METFORMIN HCL 500 MG  TB24 (METFORMIN HCL) one by mouth  bid METOPROLOL TARTRATE 50 MG TABS (METOPROLOL TARTRATE) two times a day WARFARIN SODIUM 5 MG TABS (WARFARIN SODIUM) 5,7 1/2 DIGOXIN 0.125 MG  TABS (DIGOXIN) take 1 by mouth qd ALPRAZOLAM 0.25 MG  TABS (ALPRAZOLAM) one by mouth q HS TRUETEST TEST   STRP (GLUCOSE BLOOD) 1 once daily AMARYL 4 MG TABS (GLIMEPIRIDE) Take one (1) by mouth once a day Current Allergies: ! CODEINE CODEINE SULFATE (CODEINE SULFATE) CODEINE SULFATE (CODEINE SULFATE) CODEINE SULFATE (CODEINE SULFATE) Laboratory Results   Blood Tests    Date/Time Reported: February 02, 2008 2:07 PM   PT: 13.4 s   (Normal Range: 10.6-13.4)  INR: 1.2   (Normal Range: 0.88-1.12   Therap INR: 2.0-3.5) Comments: Rita Ohara  February 02, 2008 2:07 PM       Orders Added: 1)  Est. Patient Level I [99211] 2)  Fingerstick [36416] 3)  Protime Ila.Stager    ]  ANTICOAGULATION RECORD PREVIOUS REGIMEN & LAB RESULTS Anticoagulation Diagnosis:  V58083,V58.61427.31 on  12/26/2006 Previous INR Goal Range:  2.0-3.0 on  12/26/2006 Previous INR:  2.2 on  01/02/2008 Previous Coumadin Dose(mg):  7.5 on m,w,f/5mg  others on  11/07/2007 Previous Regimen:  same dose on  12/05/2007 Previous Coagulation Comments:  OV on  01/02/2008  NEW REGIMEN & LAB  RESULTS Current INR: 1.2 Regimen: 7.5mg . for 4 days then resume Coagulation Comments: Dr. Kirtland Bouchard approved Repeat testing in: 4 weeks  Anticoagulation Visit Questionnaire Coumadin dose missed/changed:  No Abnormal Bleeding Symptoms:  No  Any diet changes including alcohol intake, vegetables or greens since the last visit:  No Any illnesses or hospitalizations since the last visit:  No Any signs of clotting since the last visit (including chest discomfort, dizziness, shortness of breath, arm tingling, slurred speech, swelling or redness in leg):  No  MEDICATIONS EVISTA 60 MG  TABS (RALOXIFENE HCL) once daily MICARDIS HCT 40-12.5 MG  TABS (TELMISARTAN-HCTZ) 1/2 once daily CARDIZEM CD 240 MG  CP24 (DILTIAZEM HCL COATED BEADS) once daily KLOR-CON 8 MEQ  TBCR (POTASSIUM CHLORIDE) once daily OSCAL 500/200 D-3 500-200 MG-UNIT  TABS (CALCIUM-VITAMIN D) 1650 TOTAL; MAGNESIUM OXIDE 400 MG  CAPS (MAGNESIUM OXIDE) 2 once daily RECLAST 5 MG/100ML  SOLN (ZOLEDRONIC ACID) yearly METFORMIN HCL 500 MG  TB24 (METFORMIN HCL) one by mouth  bid METOPROLOL TARTRATE 50 MG TABS (METOPROLOL TARTRATE) two times a day WARFARIN SODIUM 5 MG TABS (WARFARIN SODIUM) 5,7 1/2 DIGOXIN 0.125 MG  TABS (DIGOXIN) take 1 by mouth qd ALPRAZOLAM 0.25 MG  TABS (ALPRAZOLAM) one by mouth q HS TRUETEST TEST   STRP (GLUCOSE BLOOD) 1 once daily AMARYL 4 MG TABS (GLIMEPIRIDE) Take one (1) by mouth once a  day

## 2010-06-23 NOTE — Assessment & Plan Note (Signed)
Summary: 3 month rov/njr   Vital Signs:  Patient profile:   77 year old female Height:      64 inches Weight:      164 pounds BMI:     28.25 Temp:     98.2 degrees F oral Pulse rate:   68 / minute Resp:     14 per minute BP sitting:   118 / 70  (left arm)  Vitals Entered By: Willy Eddy, LPN (December 17, 2009 9:58 AM) CC: roa, Hypertension Management   Primary Care Provider:  Stacie Glaze MD  CC:  roa and Hypertension Management.  History of Present Illness: HTN   [place] from the endocrinologist including the a1c and the lipid panel she is at goal incerased stressor care gfor husband with increased memory problems and care issues   Hypertension History:      She denies headache, chest pain, palpitations, dyspnea with exertion, orthopnea, PND, peripheral edema, visual symptoms, neurologic problems, syncope, and side effects from treatment.        Positive major cardiovascular risk factors include female age 4 years old or older, diabetes, hyperlipidemia, and hypertension.  Negative major cardiovascular risk factors include negative family history for ischemic heart disease and non-tobacco-user status.     Preventive Screening-Counseling & Management  Alcohol-Tobacco     Smoking Status: never     Passive Smoke Exposure: no  Problems Prior to Update: 1)  Hysterectomy  (ICD-V88.01) 2)  Left Shoulder Surgery  () 3)  Adenocarcinoma, Breast, Hx of  (ICD-V10.3) 4)  Syncope, Hx of  (ICD-V12.49) 5)  Aortic Sclerosis  (ICD-424.1) 6)  Fracture, Pelvis, Left, Hx of  (ICD-808.8) 7)  Fracture, Femur, Right, Hx of  (ICD-821.00) 8)  Mastectomy, Left, Hx of  (ICD-V45.71) 9)  Coumadin Therapy  (ICD-V58.61) 10)  Encounter For Long-term Use of Anticoagulants  (ICD-V58.61) 11)  Atrial Fibrillation  (ICD-427.31) 12)  Hyperlipidemia, With High Hdl  (ICD-272.4) 13)  Atrial Fibrillation  (ICD-427.31) 14)  Coumadin Therapy   (ICD-V58.61) 15)  Encounter For Therapeutic Drug Monitoring  (ICD-V58.83) 16)  Acute Sinusitis, Unspecified  (ICD-461.9) 17)  Uri  (ICD-465.9) 18)  Osteoporosis  (ICD-733.00) 19)  Osteoarthritis  (ICD-715.90) 20)  Hypertension  (ICD-401.9) 21)  Diabetes Mellitus, Type II  (ICD-250.00) 22)  Atrial Fibrillation  (ICD-427.31) 23)  Osteoporosis  (ICD-733.00) 24)  Hypertension  (ICD-401.9) 25)  Diabetes Mellitus, Type II  (ICD-250.00) 26)  Atrial Fibrillation  (ICD-427.31) 27)  Coumadin Therapy  (ICD-V58.61) 28)  Encounter For Therapeutic Drug Monitoring  (ICD-V58.83)  Current Problems (verified): 1)  Hysterectomy  (ICD-V88.01) 2)  Left Shoulder Surgery  () 3)  Adenocarcinoma, Breast, Hx of  (ICD-V10.3) 4)  Syncope, Hx of  (ICD-V12.49) 5)  Aortic Sclerosis  (ICD-424.1) 6)  Fracture, Pelvis, Left, Hx of  (ICD-808.8) 7)  Fracture, Femur, Right, Hx of  (ICD-821.00) 8)  Mastectomy, Left, Hx of  (ICD-V45.71) 9)  Coumadin Therapy  (ICD-V58.61) 10)  Encounter For Long-term Use of Anticoagulants  (ICD-V58.61) 11)  Atrial Fibrillation  (ICD-427.31) 12)  Hyperlipidemia, With High Hdl  (ICD-272.4) 13)  Atrial Fibrillation  (ICD-427.31) 14)  Coumadin Therapy  (ICD-V58.61) 15)  Encounter For Therapeutic Drug Monitoring  (ICD-V58.83) 16)  Acute Sinusitis, Unspecified  (ICD-461.9) 17)  Uri  (ICD-465.9) 18)  Osteoporosis  (ICD-733.00) 19)  Osteoarthritis  (ICD-715.90) 20)  Hypertension  (ICD-401.9) 21)  Diabetes Mellitus, Type II  (ICD-250.00) 22)  Atrial Fibrillation  (ICD-427.31)  23)  Osteoporosis  (ICD-733.00) 24)  Hypertension  (ICD-401.9) 25)  Diabetes Mellitus, Type II  (ICD-250.00) 26)  Atrial Fibrillation  (ICD-427.31) 27)  Coumadin Therapy  (ICD-V58.61) 28)  Encounter For Therapeutic Drug Monitoring  (ICD-V58.83)  Medications Prior to Update: 1)  Evista 60 Mg  Tabs (Raloxifene Hcl) .... Once Daily 2)  Micardis 40 Mg Tabs (Telmisartan) .... One By Mouth Daily 3)  Cardizem Cd 240  Mg  Cp24 (Diltiazem Hcl Coated Beads) .... Once Daily 4)  Klor-Con 8 Meq  Tbcr (Potassium Chloride) .... Once Daily 5)  Caltrate 600+d 600-400 Mg-Unit Tabs (Calcium Carbonate-Vitamin D) .[name].. 1 Two Times A Day 6)  Magnesium 250 Mg Tabs (Magnesium) .... Three Times A Day 7)  Reclast 5 Mg/142ml  Soln (Zoledronic Acid) .... Yearly 8)  Metformin Hcl 500 Mg  Tb24 (Metformin Hcl) .... 2 Tabs Two Times A Day 9)  Warfarin Sodium 5 Mg Tabs (Warfarin Sodium) .... 7.5, Daily 10)  Digoxin 0.125 Mg  Tabs (Digoxin) .... Take 1 By Mouth Qd 11)  Alprazolam 0.25 Mg  Tabs (Alprazolam) .... One By Mouth Q Hs 12)  Truetest Test   Strp (Glucose Blood) .... Two Times A Day 13)  Onglyza 5 Mg .... One By Mouth Daily 14)  Actos 45 Mg Tabs (Pioglitazone Hcl) .[name].. 1 Once Daily 15)  Crestor 5 Mg Tabs (Rosuvastatin Calcium) .[name].. 1 By Mouth  Mwf Weekly 16)  Multivitamins  Tabs (Multiple Vitamin) .[name].. 1 Once Daily 17)  Vitamin C Cr 500 Mg Cr-Caps (Ascorbic Acid) .[name].. 1 Once Daily 18)  Vitamin D 2000 Unit Tabs (Cholecalciferol) .[name].. 1 Once Daily 19)  B Complex-B12  Tabs (B Complex Vitamins) .[name].. 1 Once Daily 20)  Metoprolol Tartrate 50 Mg Tabs (Metoprolol Tartrate) .[name].. 1 Two Times A Day  Current Medications (verified): 1)  Evista 60 Mg  Tabs (Raloxifene Hcl) .... Once Daily 2)  Losartan Potassium 100 Mg Tabs (Losartan Potassium) .... One By Mouth Daily 3)  Cardizem Cd 240 Mg  Cp24 (Diltiazem Hcl Coated Beads) .... Once Daily 4)  Klor-Con 8 Meq  Tbcr (Potassium Chloride) .... Once Daily 5)  Caltrate 600+d 600-400 Mg-Unit Tabs (Calcium Carbonate-Vitamin D) .[name].. 1 Two Times A Day 6)  Magnesium 250 Mg Tabs (Magnesium) .... Three Times A Day 7)  Reclast 5 Mg/129ml  Soln (Zoledronic Acid) .... Yearly 8)  Warfarin Sodium 5 Mg Tabs (Warfarin Sodium) .... 7.5, Daily 9)  Digoxin 0.125 Mg  Tabs (Digoxin) .... Take 1 By Mouth Qd 10)  Alprazolam 0.25 Mg  Tabs (Alprazolam) .... One By Mouth Q Hs 11)  Truetest Test   Strp (Glucose  Blood) .... Two Times A Day 12)  Kombiglyze Xr 2.09-998 Mg Xr24h-Tab (Saxagliptin-Metformin) .... One By Mouth Two Times A Day 13)  Actos 45 Mg Tabs (Pioglitazone Hcl) .[name].. 1 Once Daily 14)  Crestor 5 Mg Tabs (Rosuvastatin Calcium) .[name].. 1 By Mouth  Mwf Weekly 15)  Multivitamins  Tabs (Multiple Vitamin) .[name].. 1 Once Daily 16)  Vitamin C Cr 500 Mg Cr-Caps (Ascorbic Acid) .[name].. 1 Once Daily 17)  Vitamin D 2000 Unit Tabs (Cholecalciferol) .[name].. 1 Once Daily 18)  B Complex-B12  Tabs (B Complex Vitamins) .[name].. 1 Once Daily 19)  Metoprolol Tartrate 50 Mg Tabs (Metoprolol Tartrate) .[name].. 1 Two Times A Day  Allergies (verified): 1)  ! Codeine 2)  Codeine Sulfate (Codeine Sulfate) 3)  Codeine Sulfate (Codeine Sulfate) 4)  Codeine Sulfate (Codeine Sulfate)  Past History:  Family History: Last updated:  05/08/2007 mother CHF  at 93 father MI at 10  Social History: Last updated: 05/08/2007 Married Never Smoked Alcohol use-no Drug use-no Regular exercise-yes  Risk Factors: Exercise: yes (05/08/2007)  Risk Factors: Smoking Status: never (12/17/2009) Passive Smoke Exposure: no (12/17/2009)  Past medical, surgical, family and social histories (including risk factors) reviewed, and no changes noted (except as noted below).  Past Medical History: Reviewed history from 04/07/2009 and no changes required. ADENOCARCINOMA, BREAST, HX OF (ICD-V10.3) SYNCOPE, HX OF (ICD-V12.49 Nuclear stress... April, 2008... no ischemia... EF 65% EF  60%... echo.. February, 2008 Aortic valve sclerosis.... echo.... mild FRACTURE, PELVIS, LEFT, HX OF (ICD-808.8) FRACTURE, FEMUR, RIGHT, HX OF (ICD-821.00) COUMADIN THERAPY (ICD-V58.61) ENCOUNTER FOR LONG-TERM USE OF ANTICOAGULANTS (ICD-V58.61) ATRIAL FIBRILLATION (ICD-427.31)...infrequent.. paroxysmal.. Coumadin therapy HYPERLIPIDEMIA, WITH HIGH HDL (ICD-272.4) ENCOUNTER FOR THERAPEUTIC DRUG MONITORING (ICD-V58.83) ACUTE SINUSITIS, UNSPECIFIED (ICD-461.9) URI  (ICD-465.9) OSTEOPOROSIS (ICD-733.00) OSTEOARTHRITIS (ICD-715.90) HYPERTENSION (ICD-401.9) DIABETES MELLITUS, TYPE II (ICD-250.00) HYPERTENSION (ICD-401.9) DIABETES MELLITUS, TYPE II (ICD-250.00)    Past Surgical History: Reviewed history from 12/17/2008 and no changes required. HYSTERECTOMY (ICD-V88.01) * LEFT SHOULDER SURGERY MASTECTOMY, LEFT, HX OF (ICD-V45.71)  Family History: Reviewed history from 05/08/2007 and no changes required. mother CHF  at 98 father MI at 42  Social History: Reviewed history from 05/08/2007 and no changes required. Married Never Smoked Alcohol use-no Drug use-no Regular exercise-yes  Review of Systems  The patient denies anorexia, fever, weight loss, weight gain, vision loss, decreased hearing, hoarseness, chest pain, syncope, dyspnea on exertion, peripheral edema, prolonged cough, headaches, hemoptysis, abdominal pain, melena, hematochezia, severe indigestion/heartburn, hematuria, incontinence, genital sores, muscle weakness, suspicious skin lesions, transient blindness, difficulty walking, depression, unusual weight change, abnormal bleeding, enlarged lymph nodes, angioedema, and breast masses.    Physical Exam  General:  Well-developed,well-nourished,in no acute distress; alert,appropriate and cooperative throughout examination Head:  Normocephalic and atraumatic without obvious abnormalities. No apparent alopecia or balding. Eyes:  pupils equal and pupils round.   Nose:  no external deformity and nasal dischargemucosal pallor.   Mouth:  pharynx pink and moist and no erythema.   Neck:  No deformities, masses, or tenderness noted. Lungs:  Normal respiratory effort, chest expands symmetrically. Lungs are clear to auscultation, no crackles or wheezes. Heart:  no murmur, irregular rhythm, and Grade   2/6 systolic ejection murmur.   Abdomen:  Bowel sounds positive,abdomen soft and non-tender without masses, organomegaly or hernias  noted.   Impression & Recommendations:  Problem # 1:  ATRIAL FIBRILLATION (ICD-427.31) Assessment Unchanged  stable rates Her updated medication list for this problem includes:    Cardizem Cd 240 Mg Cp24 (Diltiazem hcl coated beads) ..... Once daily    Warfarin Sodium 5 Mg Tabs (Warfarin sodium) .[name]... 7.5, daily    Digoxin 0.125 Mg Tabs (Digoxin) .[name]... Take 1 by mouth qd    Metoprolol Tartrate 50 Mg Tabs (Metoprolol tartrate) .[name]... 1 two times a day  Reviewed the following: PT: 19.8 (10/01/2009)   INR: 2.9 (12/01/2009) Next Protime: 4 weeks (dated on 12/01/2009)  Problem # 2:  HYPERLIPIDEMIA, WITH HIGH HDL (ICD-272.4) Assessment: Unchanged stable with medications Her updated medication list for this problem includes:    Crestor 5 Mg Tabs (Rosuvastatin calcium) .[name]... 1 by mouth  mwf weekly stable monitering at the endocrine doctor  Labs Reviewed: SGOT: 7 (11/27/2008)   SGPT: 17 (11/27/2008)  Lipid Goals: Chol Goal: 200 (03/10/2009)   HDL Goal: 40 (03/10/2009)   LDL Goal: 100 (03/10/2009)   TG Goal: 150 (03/10/2009)  10 Yr Risk  Heart Disease: 11 % Prior 10 Yr Risk Heart Disease: 17 % (09/17/2009)   HDL:59 (09/11/2009), 70 (06/13/2009)  LDL:111 (09/11/2009), 81 (29/56/2130)  Chol:186 (09/11/2009), 159 (06/13/2009)  Trig:214 (09/11/2009), 131 (06/13/2009)  Problem # 3:  DIABETES MELLITUS, TYPE II (ICD-250.00) the pt has been on actos and has been stable with no signs of heart failure she is urged to check with her opthamologist and  screen for papliar edema ( she had exam in May) The following medications were removed from the medication list:    Metformin Hcl 500 Mg Tb24 (Metformin hcl) .[name]... 2 tabs two times a day Her updated medication list for this problem includes:    Losartan Potassium 100 Mg Tabs (Losartan potassium) ..... One by mouth daily    Kombiglyze Xr 2.09-998 Mg Xr24h-tab (Saxagliptin-metformin) ..... One by mouth two times a day    Actos 45 Mg Tabs  (Pioglitazone hcl) .[name]... 1 once daily  Labs Reviewed: Creat: 0.7 (04/22/2008)    Reviewed HgBA1c results: 6.8 (09/11/2009)  6.9 (06/13/2009)  Problem # 4:  HYPERTENSION (ICD-401.9) Assessment: Unchanged chang e to the losartin due to cost and formulary issues pt to mniter blood pressure to see if this drug is egually effective Her updated medication list for this problem includes:    Losartan Potassium 100 Mg Tabs (Losartan potassium) ..... One by mouth daily    Cardizem Cd 240 Mg Cp24 (Diltiazem hcl coated beads) ..... Once daily    Metoprolol Tartrate 50 Mg Tabs (Metoprolol tartrate) .[name]... 1 two times a day  BP today: 118/70 Prior BP: 136/60 (09/17/2009)  10 Yr Risk Heart Disease: 11 % Prior 10 Yr Risk Heart Disease: 17 % (09/17/2009)  Labs Reviewed: K+: 3.8 (11/27/2008) Creat: : 0.7 (04/22/2008)   Chol: 186 (09/11/2009)   HDL: 59 (09/11/2009)   LDL: 111 (09/11/2009)   TG: 214 (09/11/2009)  Complete Medication List: 1)  Evista 60 Mg Tabs (Raloxifene hcl) .... Once daily 2)  Losartan Potassium 100 Mg Tabs (Losartan potassium) .... One by mouth daily 3)  Cardizem Cd 240 Mg Cp24 (Diltiazem hcl coated beads) .... Once daily 4)  Klor-con 8 Meq Tbcr (Potassium chloride) .... Once daily 5)  Caltrate 600+d 600-400 Mg-unit Tabs (Calcium carbonate-vitamin d) .[name].. 1 two times a day 6)  Magnesium 250 Mg Tabs (Magnesium) .... Three times a day 7)  Reclast 5 Mg/153ml Soln (Zoledronic acid) .... Yearly 8)  Warfarin Sodium 5 Mg Tabs (Warfarin sodium) .... 7.5, daily 9)  Digoxin 0.125 Mg Tabs (Digoxin) .... Take 1 by mouth qd 10)  Alprazolam 0.25 Mg Tabs (Alprazolam) .... One by mouth q hs 11)  Truetest Test Strp (Glucose blood) .... Two times a day 12)  Kombiglyze Xr 2.09-998 Mg Xr24h-tab (Saxagliptin-metformin) .... One by mouth two times a day 13)  Actos 45 Mg Tabs (Pioglitazone hcl) .[name].. 1 once daily 14)  Crestor 5 Mg Tabs (Rosuvastatin calcium) .[name].. 1 by mouth  mwf weekly 15)   Multivitamins Tabs (Multiple vitamin) .[name].. 1 once daily 16)  Vitamin C Cr 500 Mg Cr-caps (Ascorbic acid) .[name].. 1 once daily 17)  Vitamin D 2000 Unit Tabs (Cholecalciferol) .[name].. 1 once daily 18)  B Complex-b12 Tabs (B complex vitamins) .[name].. 1 once daily 19)  Metoprolol Tartrate 50 Mg Tabs (Metoprolol tartrate) .[name].. 1 two times a day  Hypertension Assessment/Plan:      The patient's hypertensive risk group is category C: Target organ damage and/or diabetes.  Her calculated 10 year risk of coronary heart disease is  11 %.  Today's blood pressure is 118/70.  Her blood pressure goal is < 130/80.  Patient Instructions: 1)  Please schedule a follow-up appointment in 3 months. Prescriptions: LOSARTAN POTASSIUM 100 MG TABS (LOSARTAN POTASSIUM) one by mouth daily  #30 x 11   Entered and Authorized by:   Stacie Glaze MD   Signed by:   Stacie Glaze MD on 12/17/2009   Method used:   Electronically to        [facility]  [phone]* (retail)       [address] [place], [place]  [address]       Ph: [phone] or [phone]       Fax: [phone]   RxID:   [id number] KOMBIGLYZE XR 2.09-998 MG XR24H-TAB (SAXAGLIPTIN-METFORMIN) one by mouth two times a day  #60 x 11   Entered and Authorized by:   Stacie Glaze MD   Signed by:   Stacie Glaze MD on 12/17/2009   Method used:   Electronically to        [facility]  [phone]* (retail)       [address] [place], [place]  [address]       Ph: [phone] or [phone]       Fax: [phone]   RxID:   [id number] KOMBIGLYZE XR 09-998 MG XR24H-TAB (SAXAGLIPTIN-METFORMIN) one by mouth daily  #30 x 11   Entered and Authorized by:   Stacie Glaze MD   Signed by:   Stacie Glaze MD on 12/17/2009   Method used:   Electronically to        [facility]  [phone]* (retail)       [address] [place], [place]  [address]       Ph: [phone] or [phone]       Fax: [phone]   RxID:    [id number]

## 2010-06-23 NOTE — Letter (Signed)
Summary: [facility] Office Note  [facility] Office Note   Imported By: Roderic Ovens 06/23/2009 16:06:31  _____________________________________________________________________  External Attachment:    Type:   Image     Comment:   External Document

## 2010-06-23 NOTE — Assessment & Plan Note (Signed)
Summary: 3 month rov/njr rsc bmp/njr   Vital Signs:  Patient profile:   77 year old female Height:      64 inches Weight:      157 pounds BMI:     27.05 Temp:     98.4 degrees F oral Pulse rate:   72 / minute Resp:     14 per minute BP sitting:   146 / 80  (left arm)  Vitals Entered By: Willy Eddy, LPN (March 23, 2010 9:54 AM) CC: roa Is Patient Diabetic? Yes Did you bring your meter with you today? No   Primary Care Provider:  Stacie Glaze MD  CC:  roa.  History of Present Illness: the pt presents for  follow up of DM and HTN as well as labs from an outside referring doctor. the pt has elevated blood pressures at the endocrinologist and noted some mild swelling in her ankles She was on micardis and insurance swiched her drugs  Preventive Screening-Counseling & Management  Alcohol-Tobacco     Smoking Status: never     Passive Smoke Exposure: no  Current Problems (verified): 1)  Hysterectomy  (ICD-V88.01) 2)  Left Shoulder Surgery  () 3)  Adenocarcinoma, Breast, Hx of  (ICD-V10.3) 4)  Syncope, Hx of  (ICD-V12.49) 5)  Aortic Sclerosis  (ICD-424.1) 6)  Fracture, Pelvis, Left, Hx of  (ICD-808.8) 7)  Fracture, Femur, Right, Hx of  (ICD-821.00) 8)  Mastectomy, Left, Hx of  (ICD-V45.71) 9)  Coumadin Therapy  (ICD-V58.61) 10)  Encounter For Long-term Use of Anticoagulants  (ICD-V58.61) 11)  Atrial Fibrillation  (ICD-427.31) 12)  Hyperlipidemia, With High Hdl  (ICD-272.4) 13)  Atrial Fibrillation  (ICD-427.31) 14)  Coumadin Therapy  (ICD-V58.61) 15)  Encounter For Therapeutic Drug Monitoring  (ICD-V58.83) 16)  Acute Sinusitis, Unspecified  (ICD-461.9) 17)  Uri  (ICD-465.9) 18)  Osteoporosis  (ICD-733.00) 19)  Osteoarthritis  (ICD-715.90) 20)  Hypertension  (ICD-401.9) 21)  Diabetes Mellitus, Type II  (ICD-250.00) 22)  Atrial Fibrillation  (ICD-427.31) 23)  Osteoporosis  (ICD-733.00) 24)  Hypertension  (ICD-401.9) 25)  Diabetes Mellitus, Type II   (ICD-250.00) 26)  Atrial Fibrillation  (ICD-427.31) 27)  Coumadin Therapy  (ICD-V58.61) 28)  Encounter For Therapeutic Drug Monitoring  (ICD-V58.83)  Current Medications (verified): 1)  Evista 60 Mg  Tabs (Raloxifene Hcl) .... Once Daily 2)  Losartan Potassium 100 Mg Tabs (Losartan Potassium) .... One By Mouth Daily 3)  Cardizem Cd 240 Mg  Cp24 (Diltiazem Hcl Coated Beads) .... Once Daily 4)  Klor-Con 8 Meq  Tbcr (Potassium Chloride) .... Once Daily 5)  Caltrate 600+d 600-400 Mg-Unit Tabs (Calcium Carbonate-Vitamin D) .[name].. 1 Two Times A Day 6)  Magnesium 250 Mg Tabs (Magnesium) .... Three Times A Day 7)  Reclast 5 Mg/125ml  Soln (Zoledronic Acid) .... Yearly 8)  Warfarin Sodium 5 Mg Tabs (Warfarin Sodium) .... 7.5, Daily 9)  Digoxin 0.125 Mg  Tabs (Digoxin) .... Take 1 By Mouth Qd 10)  Alprazolam 0.25 Mg  Tabs (Alprazolam) .... One By Mouth Q Hs 11)  Truetest Test   Strp (Glucose Blood) .... Two Times A Day 12)  Kombiglyze Xr 2.09-998 Mg Xr24h-Tab (Saxagliptin-Metformin) .... One By Mouth Two Times A Day 13)  Actos 45 Mg Tabs (Pioglitazone Hcl) .[name].. 1 Once Daily 14)  Crestor 5 Mg Tabs (Rosuvastatin Calcium) .[name].. 1 By Mouth  Mwf Weekly 15)  Multivitamins  Tabs (Multiple Vitamin) .[name].. 1 Once Daily 16)  Vitamin C Cr 500 Mg Cr-Caps (Ascorbic Acid) .Marland KitchenMarland KitchenMarland Kitchen  1 Once Daily 17)  Vitamin D 2000 Unit Tabs (Cholecalciferol) .[name].. 1 Once Daily 18)  B Complex-B12  Tabs (B Complex Vitamins) .[name].. 1 Once Daily 19)  Metoprolol Tartrate 50 Mg Tabs (Metoprolol Tartrate) .[name].. 1 Two Times A Day  Allergies (verified): 1)  ! Codeine 2)  Codeine Sulfate (Codeine Sulfate) 3)  Codeine Sulfate (Codeine Sulfate) 4)  Codeine Sulfate (Codeine Sulfate)  Past History:  Family History: Last updated: 05/08/2007 mother CHF  at 42 father MI at 9  Social History: Last updated: 05/08/2007 Married Never Smoked Alcohol use-no Drug use-no Regular exercise-yes  Risk Factors: Exercise: yes (05/08/2007)  Risk  Factors: Smoking Status: never (03/23/2010) Passive Smoke Exposure: no (03/23/2010)  Past medical, surgical, family and social histories (including risk factors) reviewed, and no changes noted (except as noted below).  Past Medical History: Reviewed history from 04/07/2009 and no changes required. ADENOCARCINOMA, BREAST, HX OF (ICD-V10.3) SYNCOPE, HX OF (ICD-V12.49 Nuclear stress... April, 2008... no ischemia... EF 65% EF  60%... echo.. February, 2008 Aortic valve sclerosis.... echo.... mild FRACTURE, PELVIS, LEFT, HX OF (ICD-808.8) FRACTURE, FEMUR, RIGHT, HX OF (ICD-821.00) COUMADIN THERAPY (ICD-V58.61) ENCOUNTER FOR LONG-TERM USE OF ANTICOAGULANTS (ICD-V58.61) ATRIAL FIBRILLATION (ICD-427.31)...infrequent.. paroxysmal.. Coumadin therapy HYPERLIPIDEMIA, WITH HIGH HDL (ICD-272.4) ENCOUNTER FOR THERAPEUTIC DRUG MONITORING (ICD-V58.83) ACUTE SINUSITIS, UNSPECIFIED (ICD-461.9) URI (ICD-465.9) OSTEOPOROSIS (ICD-733.00) OSTEOARTHRITIS (ICD-715.90) HYPERTENSION (ICD-401.9) DIABETES MELLITUS, TYPE II (ICD-250.00) HYPERTENSION (ICD-401.9) DIABETES MELLITUS, TYPE II (ICD-250.00)    Past Surgical History: Reviewed history from 12/17/2008 and no changes required. HYSTERECTOMY (ICD-V88.01) * LEFT SHOULDER SURGERY MASTECTOMY, LEFT, HX OF (ICD-V45.71)  Family History: Reviewed history from 05/08/2007 and no changes required. mother CHF  at 37 father MI at 60  Social History: Reviewed history from 05/08/2007 and no changes required. Married Never Smoked Alcohol use-no Drug use-no Regular exercise-yes  Review of Systems  The patient denies anorexia, fever, weight loss, weight gain, vision loss, decreased hearing, hoarseness, chest pain, syncope, dyspnea on exertion, peripheral edema, prolonged cough, headaches, hemoptysis, abdominal pain, melena, hematochezia, severe indigestion/heartburn, hematuria, incontinence, genital sores, muscle weakness, suspicious skin lesions, transient  blindness, difficulty walking, depression, unusual weight change, abnormal bleeding, enlarged lymph nodes, angioedema, and breast masses.    Physical Exam  General:  Well-developed,well-nourished,in no acute distress; alert,appropriate and cooperative throughout examination Head:  Normocephalic and atraumatic without obvious abnormalities. No apparent alopecia or balding. Eyes:  pupils equal and pupils round.   Nose:  no external deformity and nasal dischargemucosal pallor.   Mouth:  pharynx pink and moist and no erythema.   Neck:  No deformities, masses, or tenderness noted. Lungs:  Normal respiratory effort, chest expands symmetrically. Lungs are clear to auscultation, no crackles or wheezes. Heart:  normal rate and regular rhythm.   Abdomen:  Bowel sounds positive,abdomen soft and non-tender without masses, organomegaly or hernias noted. Neurologic:  alert & oriented X3 and finger-to-nose normal.     Impression & Recommendations:  Problem # 1:  HYPERLIPIDEMIA, WITH HIGH HDL (ICD-272.4) stable Her updated medication list for this problem includes:    Crestor 5 Mg Tabs (Rosuvastatin calcium) .[name]... 1 by mouth  mwf weekly  Labs Reviewed: SGOT: 7 (11/27/2008)   SGPT: 17 (11/27/2008)  Lipid Goals: Chol Goal: 200 (03/10/2009)   HDL Goal: 40 (03/10/2009)   LDL Goal: 100 (03/10/2009)   TG Goal: 150 (03/10/2009)  Prior 10 Yr Risk Heart Disease: 11 % (12/17/2009)   HDL:67 (03/23/2010), 59 (09/11/2009)  LDL:81 (03/23/2010), 111 (60/45/4098)  Chol:156 (03/23/2010), 186 (09/11/2009)  Trig:214 (09/11/2009), 131 (06/13/2009)  Problem # 2:  DIABETES MELLITUS, TYPE II (ICD-250.00)  Her updated medication list for this problem includes:    Losartan Potassium-hctz 100-12.5 Mg Tabs (Losartan potassium-hctz) ..... One by mouth daily    Kombiglyze Xr 2.09-998 Mg Xr24h-tab (Saxagliptin-metformin) ..... One by mouth two times a day    Actos 45 Mg Tabs (Pioglitazone hcl) .[name]... 1 once daily  Labs  Reviewed: Creat: .07 (03/23/2010)    Reviewed HgBA1c results: 6.7 (03/23/2010)  6.8 (09/11/2009)  Problem # 3:  HYPERTENSION (ICD-401.9)  Her updated medication list for this problem includes:    Losartan Potassium-hctz 100-12.5 Mg Tabs (Losartan potassium-hctz) ..... One by mouth daily    Cardizem Cd 240 Mg Cp24 (Diltiazem hcl coated beads) ..... Once daily    Metoprolol Tartrate 50 Mg Tabs (Metoprolol tartrate) .[name]... 1 two times a day  BP today: 146/80 Prior BP: 170/80 (02/13/2010)  Prior 10 Yr Risk Heart Disease: 11 % (12/17/2009)  Labs Reviewed: K+: 3.8 (11/27/2008) Creat: : .07 (03/23/2010)   Chol: 156 (03/23/2010)   HDL: 67 (03/23/2010)   LDL: 81 (03/23/2010)   TG: 214 (09/11/2009)  Complete Medication List: 1)  Evista 60 Mg Tabs (Raloxifene hcl) .... Once daily 2)  Losartan Potassium-hctz 100-12.5 Mg Tabs (Losartan potassium-hctz) .... One by mouth daily 3)  Cardizem Cd 240 Mg Cp24 (Diltiazem hcl coated beads) .... Once daily 4)  Klor-con 8 Meq Tbcr (Potassium chloride) .... Once daily 5)  Caltrate 600+d 600-400 Mg-unit Tabs (Calcium carbonate-vitamin d) .[name].. 1 two times a day 6)  Magnesium 250 Mg Tabs (Magnesium) .... Three times a day 7)  Reclast 5 Mg/156ml Soln (Zoledronic acid) .... Yearly 8)  Warfarin Sodium 5 Mg Tabs (Warfarin sodium) .... 7.5, daily 9)  Digoxin 0.125 Mg Tabs (Digoxin) .... Take 1 by mouth qd 10)  Alprazolam 0.25 Mg Tabs (Alprazolam) .... One by mouth q hs 11)  Truetest Test Strp (Glucose blood) .... Two times a day 12)  Kombiglyze Xr 2.09-998 Mg Xr24h-tab (Saxagliptin-metformin) .... One by mouth two times a day 13)  Actos 45 Mg Tabs (Pioglitazone hcl) .[name].. 1 once daily 14)  Crestor 5 Mg Tabs (Rosuvastatin calcium) .[name].. 1 by mouth  mwf weekly 15)  Multivitamins Tabs (Multiple vitamin) .[name].. 1 once daily 16)  Vitamin C Cr 500 Mg Cr-caps (Ascorbic acid) .[name].. 1 once daily 17)  Vitamin D 2000 Unit Tabs (Cholecalciferol) .[name].. 1 once daily 18)  B  Complex-b12 Tabs (B complex vitamins) .[name].. 1 once daily 19)  Metoprolol Tartrate 50 Mg Tabs (Metoprolol tartrate) .[name].. 1 two times a day  Patient Instructions: 1)  Please schedule a follow-up appointment in 3 months. 2)  if you get dizzy or lighthead with the addition of the hctz cut the pill in 1/2 Prescriptions: LOSARTAN POTASSIUM-HCTZ 100-12.5 MG TABS (LOSARTAN POTASSIUM-HCTZ) one by mouth daily  #30 x 11   Entered and Authorized by:   Stacie Glaze MD   Signed by:   Stacie Glaze MD on 03/23/2010   Method used:   Electronically to        [facility]  [phone]* (retail)       [address] [place], [place]  [address]       Ph: [phone] or [phone]       Fax: [phone]   RxID:   [id number]    Orders Added: 1)  Est. Patient Level IV [60737]  Appended Document: 3 month rov/njr rsc bmp/njr   ANTICOAGULATION  RECORD PREVIOUS REGIMEN & LAB RESULTS Anticoagulation Diagnosis:  v58.83,v58.61,427.31 on  05/31/2008 Previous INR Goal Range:  2.0-3.0 on  05/31/2008 Previous INR:  3.1 on  02/27/2010 Previous Coumadin Dose(mg):  7.5mg ,10mg  alt. on  08/08/2009 Previous Regimen:  same on  12/29/2009 Previous Coagulation Comments:  Dr. Kirtland Bouchard approved. on  01/31/2009  NEW REGIMEN & LAB RESULTS Current INR: 2.1 Regimen: same  Repeat testing in: 4 weeks  Anticoagulation Visit Questionnaire Coumadin dose missed/changed:  No Abnormal Bleeding Symptoms:  No  Any diet changes including alcohol intake, vegetables or greens since the last visit:  No Any illnesses or hospitalizations since the last visit:  No Any signs of clotting since the last visit (including chest discomfort, dizziness, shortness of breath, arm tingling, slurred speech, swelling or redness in leg):  No  MEDICATIONS EVISTA 60 MG  TABS (RALOXIFENE HCL) once daily LOSARTAN POTASSIUM-HCTZ 100-12.5 MG TABS (LOSARTAN POTASSIUM-HCTZ) one by mouth daily CARDIZEM CD 240 MG  CP24 (DILTIAZEM HCL COATED  BEADS) once daily KLOR-CON 8 MEQ  TBCR (POTASSIUM CHLORIDE) once daily CALTRATE 600+D 600-400 MG-UNIT TABS (CALCIUM CARBONATE-VITAMIN D) 1 two times a day MAGNESIUM 250 MG TABS (MAGNESIUM) three times a day RECLAST 5 MG/100ML  SOLN (ZOLEDRONIC ACID) yearly WARFARIN SODIUM 5 MG TABS (WARFARIN SODIUM) 7.5, daily DIGOXIN 0.125 MG  TABS (DIGOXIN) take 1 by mouth qd ALPRAZOLAM 0.25 MG  TABS (ALPRAZOLAM) one by mouth q HS TRUETEST TEST   STRP (GLUCOSE BLOOD) two times a day KOMBIGLYZE XR 2.09-998 MG XR24H-TAB (SAXAGLIPTIN-METFORMIN) one by mouth two times a day ACTOS 45 MG TABS (PIOGLITAZONE HCL) 1 once daily CRESTOR 5 MG TABS (ROSUVASTATIN CALCIUM) 1 by mouth  MWF weekly MULTIVITAMINS  TABS (MULTIPLE VITAMIN) 1 once daily VITAMIN C CR 500 MG CR-CAPS (ASCORBIC ACID) 1 once daily VITAMIN D 2000 UNIT TABS (CHOLECALCIFEROL) 1 once daily B COMPLEX-B12  TABS (B COMPLEX VITAMINS) 1 once daily METOPROLOL TARTRATE 50 MG TABS (METOPROLOL TARTRATE) 1 two times a day    Laboratory Results   Blood Tests      INR: 2.1   (Normal Range: 0.88-1.12   Therap INR: 2.0-3.5) Comments: Rita Ohara  March 23, 2010 10:44 AM

## 2010-06-23 NOTE — Assessment & Plan Note (Signed)
Summary: protime labs//ccm   Nurse Visit   Allergies: 1)  ! Codeine 2)  Codeine Sulfate (Codeine Sulfate) 3)  Codeine Sulfate (Codeine Sulfate) 4)  Codeine Sulfate (Codeine Sulfate) Laboratory Results   Blood Tests   Date/Time Received: August 08, 2009 3:16 PM  Date/Time Reported: August 08, 2009 3:16 PM   PT: 20.2 s   (Normal Range: 10.6-13.4)  INR: 2.8   (Normal Range: 0.88-1.12   Therap INR: 2.0-3.5) Comments: Wynona Canes, CMA  August 08, 2009 3:16 PM     Orders Added: 1)  Est. Patient Level I [99211] 2)  Protime [[id number]]   ANTICOAGULATION RECORD PREVIOUS REGIMEN & LAB RESULTS Anticoagulation Diagnosis:  v58.83,v58.61,427.31 on  05/31/2008 Previous INR Goal Range:  2.0-3.0 on  05/31/2008 Previous INR:  .9 on  07/18/2009 Previous Coumadin Dose(mg):  7.5mg  qd on  07/18/2009 Previous Regimen:  10mg  ,7.5mg  alt. on  07/18/2009 Previous Coagulation Comments:  Dr. Kirtland Bouchard approved. on  01/31/2009  NEW REGIMEN & LAB RESULTS Current INR: 2.8 Current Coumadin Dose(mg): 7.5mg ,10mg  alt. Regimen: 7.5mg ,10mg  alt.       Repeat testing in: 4 weeks MEDICATIONS EVISTA 60 MG  TABS (RALOXIFENE HCL) once daily MICARDIS 40 MG TABS (TELMISARTAN) one by mouth daily CARDIZEM CD 240 MG  CP24 (DILTIAZEM HCL COATED BEADS) once daily KLOR-CON 8 MEQ  TBCR (POTASSIUM CHLORIDE) once daily CALTRATE 600+D 600-400 MG-UNIT TABS (CALCIUM CARBONATE-VITAMIN D) 1 two times a day MAGNESIUM 250 MG TABS (MAGNESIUM) three times a day RECLAST 5 MG/100ML  SOLN (ZOLEDRONIC ACID) yearly METFORMIN HCL 500 MG  TB24 (METFORMIN HCL) 2 tabs two times a day WARFARIN SODIUM 5 MG TABS (WARFARIN SODIUM) 7.5, daily DIGOXIN 0.125 MG  TABS (DIGOXIN) take 1 by mouth qd ALPRAZOLAM 0.25 MG  TABS (ALPRAZOLAM) one by mouth q HS TRUETEST TEST   STRP (GLUCOSE BLOOD) two times a day * ONGLYZA 5 MG one by mouth daily ACTOS 45 MG TABS (PIOGLITAZONE HCL) 1 once daily CRESTOR 5 MG TABS (ROSUVASTATIN CALCIUM) 1 by mouth  MWF  weekly MULTIVITAMINS  TABS (MULTIPLE VITAMIN) 1 once daily VITAMIN C CR 500 MG CR-CAPS (ASCORBIC ACID) 1 once daily VITAMIN D 2000 UNIT TABS (CHOLECALCIFEROL) 1 once daily B COMPLEX-B12  TABS (B COMPLEX VITAMINS) 1 once daily   Anticoagulation Visit Questionnaire      Coumadin dose missed/changed:  No      Abnormal Bleeding Symptoms:  No   Any diet changes including alcohol intake, vegetables or greens since the last visit:  No Any illnesses or hospitalizations since the last visit:  No Any signs of clotting since the last visit (including chest discomfort, dizziness, shortness of breath, arm tingling, slurred speech, swelling or redness in leg):  No   Laboratory Results   Blood Tests     PT: 20.2 s   (Normal Range: 10.6-13.4)  INR: 2.8   (Normal Range: 0.88-1.12   Therap INR: 2.0-3.5) Comments: Wynona Canes, CMA  August 08, 2009 3:16 PM

## 2010-06-23 NOTE — Progress Notes (Signed)
  Phone Note Call from Patient   Caller: pt Summary of Call: bp 90/70 pulse 40..what ot do???? have family take you to [place] now Initial call taken by: Roderick Pee MD,  May 17, 2008 2:11 PM

## 2010-06-23 NOTE — Assessment & Plan Note (Signed)
Summary: PT//CCM  Nurse Visit     Laboratory Results   Blood Tests     PT: 16.1 s   (Normal Range: 10.6-13.4)  INR: 1.7   (Normal Range: 0.88-1.12   Therap INR: 2.0-3.5) Comments: [name] CMA  Sep 30, 2008 2:32 PM       Orders Added: 1)  Est. Patient Level I [99211] 2)  Protime [[id number]] 3)  Fingerstick [[id number]]      ANTICOAGULATION RECORD  NEW REGIMEN & LAB RESULTS Current INR: 1.7 Regimen:   (no change)   Anticoagulation Visit Questionnaire Coumadin dose missed/changed:  No Abnormal Bleeding Symptoms:  Yes    Bruising or bleeding from nose or gums, in urine or stool since the last visit:  "alot more bruising", on both legs. Any diet changes including alcohol intake, vegetables or greens since the last visit:  No Any illnesses or hospitalizations since the last visit:  No Any signs of clotting since the last visit (including chest discomfort, dizziness, shortness of breath, arm tingling, slurred speech, swelling or redness in leg):  No

## 2010-06-23 NOTE — Letter (Signed)
Summary: [facility] Endocrinology & Diabetes  [facility] Endocrinology [facility] Diabetes   Imported By: Maryln Gottron 09/18/2009 09:43:48  _____________________________________________________________________  External Attachment:    Type:   Image     Comment:   External Document

## 2010-06-23 NOTE — Progress Notes (Signed)
Summary: Requesting OV  Phone Note Call from Patient Call back at Home Phone [phone]   Caller: Patient Call For: Lovell Sheehan Summary of Call: Pt requesting OV, "something is still wrong, still does not feel good, achy all over", drinking lots of fluids, voiding alot during the night, so she is not sleeping well, feeling some bladder pressure and back pain, no burning, BS was 300 this AM.  Last week she was instructed to increase Metformin and she feels it is related to the cortisone injection.  When can she come in? Initial call taken by: Sid Falcon LPN,  January 17, 2008 9:47 AM  Follow-up for Phone Call        appointment given for tomorrow at 11:15 Follow-up by: Willy Eddy, LPN,  January 17, 2008 10:01 AM

## 2010-06-23 NOTE — Assessment & Plan Note (Signed)
Summary: pt/mm   Nurse Visit   Vital Signs:  Patient Profile:   77 Years Old Female Height:     64 inches Pulse rate:   52 / minute BP sitting:   110 / 49  (left arm)                 Prior Medications: EVISTA 60 MG  TABS (RALOXIFENE HCL) once daily MICARDIS 20 MG TABS (TELMISARTAN) 1 once daily CARDIZEM CD 240 MG  CP24 (DILTIAZEM HCL COATED BEADS) once daily KLOR-CON 8 MEQ  TBCR (POTASSIUM CHLORIDE) once daily OSCAL 500/200 D-3 500-200 MG-UNIT  TABS (CALCIUM-VITAMIN D) 1650 TOTAL; MAGNESIUM OXIDE 400 MG  CAPS (MAGNESIUM OXIDE) 2 once daily RECLAST 5 MG/100ML  SOLN (ZOLEDRONIC ACID) yearly METFORMIN HCL 500 MG  TB24 (METFORMIN HCL) 2 two times a day METOPROLOL TARTRATE 50 MG TABS (METOPROLOL TARTRATE) two times a day WARFARIN SODIUM 5 MG TABS (WARFARIN SODIUM) 7.5 for 3 days and 5 all others DIGOXIN 0.125 MG  TABS (DIGOXIN) take 1 by mouth qd ALPRAZOLAM 0.25 MG  TABS (ALPRAZOLAM) one by mouth q HS TRUETEST TEST   STRP (GLUCOSE BLOOD) two times a day ONGLYZA 5 MG () one by mouth daily ACTOS 45 MG TABS (PIOGLITAZONE HCL) 1 once daily CRESTOR 5 MG TABS (ROSUVASTATIN CALCIUM) 1 once daily MULTIVITAMINS  TABS (MULTIPLE VITAMIN) 1 once daily VITAMIN C CR 500 MG CR-CAPS (ASCORBIC ACID) 1 once daily Current Allergies: ! CODEINE CODEINE SULFATE (CODEINE SULFATE) CODEINE SULFATE (CODEINE SULFATE) CODEINE SULFATE (CODEINE SULFATE) Laboratory Results   Blood Tests    Date/Time Reported: July 19, 2008 2:39 PM   PT: 15.7 s   (Normal Range: 10.6-13.4)  INR: 1.6   (Normal Range: 0.88-1.12   Therap INR: 2.0-3.5) Comments: Wynona Canes, CMA  July 19, 2008 2:39 PM       Orders Added: 1)  Est. Patient Level I [99211] 2)  Protime [[id number]] 3)  Fingerstick Xanthus.Diener    ]  Vital Signs:  Patient Profile:   77 Years Old Female Height:     64 inches Pulse rate:   52 / minute BP sitting:   110 / 49               Laboratory Results   Blood Tests      PT: 15.7 s   (Normal Range: 10.6-13.4)  INR: 1.6   (Normal Range: 0.88-1.12   Therap INR: 2.0-3.5) Comments: Wynona Canes, CMA  July 19, 2008 2:39 PM       ANTICOAGULATION RECORD PREVIOUS REGIMEN & LAB RESULTS Anticoagulation Diagnosis:  v58.83,v58.61,427.31 on  05/31/2008 Previous INR Goal Range:  2.0-3.0 on  05/31/2008 Previous INR:  1.4 on  05/31/2008 Previous Coumadin Dose(mg):  7.5mg  on m,w,f 5mg  other days on  05/31/2008 Previous Regimen:  5mg  on m,w,f 7.5mg  other days on  05/31/2008 Previous Coagulation Comments:  Dr. Kirtland Bouchard approved on  02/02/2008  NEW REGIMEN & LAB RESULTS Current INR: 1.6 Regimen: same dose       Repeat testing in: 4 weeks MEDICATIONS EVISTA 60 MG  TABS (RALOXIFENE HCL) once daily MICARDIS 20 MG TABS (TELMISARTAN) 1 once daily CARDIZEM CD 240 MG  CP24 (DILTIAZEM HCL COATED BEADS) once daily KLOR-CON 8 MEQ  TBCR (POTASSIUM CHLORIDE) once daily OSCAL 500/200 D-3 500-200 MG-UNIT  TABS (CALCIUM-VITAMIN D) 1650 TOTAL; MAGNESIUM OXIDE 400 MG  CAPS (MAGNESIUM OXIDE) 2 once daily RECLAST 5 MG/100ML  SOLN (ZOLEDRONIC ACID) yearly METFORMIN HCL 500 MG  TB24 (METFORMIN HCL)  2 two times a day METOPROLOL TARTRATE 50 MG TABS (METOPROLOL TARTRATE) two times a day WARFARIN SODIUM 5 MG TABS (WARFARIN SODIUM) 7.5 for 3 days and 5 all others DIGOXIN 0.125 MG  TABS (DIGOXIN) take 1 by mouth qd ALPRAZOLAM 0.25 MG  TABS (ALPRAZOLAM) one by mouth q HS TRUETEST TEST   STRP (GLUCOSE BLOOD) two times a day * ONGLYZA 5 MG one by mouth daily ACTOS 45 MG TABS (PIOGLITAZONE HCL) 1 once daily CRESTOR 5 MG TABS (ROSUVASTATIN CALCIUM) 1 once daily MULTIVITAMINS  TABS (MULTIPLE VITAMIN) 1 once daily VITAMIN C CR 500 MG CR-CAPS (ASCORBIC ACID) 1 once daily   Anticoagulation Visit Questionnaire      Coumadin dose missed/changed:  No      Abnormal Bleeding Symptoms:  No   Any diet changes including alcohol intake, vegetables or greens since the last visit:  No Any  illnesses or hospitalizations since the last visit:  No Any signs of clotting since the last visit (including chest discomfort, dizziness, shortness of breath, arm tingling, slurred speech, swelling or redness in leg):  No

## 2010-06-23 NOTE — Letter (Signed)
Summary: [facility] Endocrinology and Diabetes  [facility] Endocrinology and Diabetes   Imported By: Maryln Gottron 03/12/2009 14:00:17  _____________________________________________________________________  External Attachment:    Type:   Image     Comment:   External Document

## 2010-06-23 NOTE — Assessment & Plan Note (Signed)
Summary: 3 month rov/njr   Vital Signs:  Patient Profile:   77 Years Old Female Height:     64 inches Weight:      166 pounds Temp:     98.2 degrees F oral Pulse rate:   76 / minute Resp:     14 per minute BP sitting:   140 / 80  (left arm)  Vitals Entered By: Willy Eddy, LPN (November 07, 2007 10:13 AM)                 Chief Complaint:  roa- c/o cbs elevated some.  History of Present Illness:  Follow-Up Visit      This is an 77 year old woman who presents for Follow-up visit.  The patient complains of dizziness, but denies chest pain, palpitations, syncope, low blood sugar symptoms, high blood sugar symptoms, edema, SOB, DOE, PND, and orthopnea.  Since the last visit the patient notes no new problems or concerns.  The patient reports taking meds as prescribed, monitoring BP, and monitoring blood sugars.  When questioned about possible medication side effects, the patient notes none.      Current Allergies: ! CODEINE CODEINE SULFATE (CODEINE SULFATE) CODEINE SULFATE (CODEINE SULFATE) CODEINE SULFATE (CODEINE SULFATE)  Past Medical History:    Reviewed history from 05/08/2007 and no changes required:       Atrial fibrillation       Diabetes mellitus, type II       Hypertension       Osteoarthritis       Osteoporosis  Past Surgical History:    Reviewed history from 05/08/2007 and no changes required:       Hysterectomy       Mastectomy       shoulder dislocation          Family History:    Reviewed history from 05/08/2007 and no changes required:       mother CHF  at 78       father MI at 48  Social History:    Reviewed history from 05/08/2007 and no changes required:       Married       Never Smoked       Alcohol use-no       Drug use-no       Regular exercise-yes    Review of Systems  The patient denies anorexia, fever, weight loss, weight gain, vision loss, decreased hearing, hoarseness, chest pain, syncope, dyspnea on exertion, peripheral  edema, prolonged cough, headaches, hemoptysis, abdominal pain, melena, hematochezia, severe indigestion/heartburn, hematuria, incontinence, genital sores, muscle weakness, suspicious skin lesions, transient blindness, difficulty walking, depression, unusual weight change, abnormal bleeding, enlarged lymph nodes, angioedema, breast masses, and testicular masses.     Physical Exam  General:     Well-developed,well-nourished,in no acute distress; alert,appropriate and cooperative throughout examination Head:     normocephalic.   Ears:     External ear exam shows no significant lesions or deformities.  Otoscopic examination reveals clear canals, tympanic membranes are intact bilaterally without bulging, retraction, inflammation or discharge. Hearing is grossly normal bilaterally. Nose:     nasal dischargemucosal pallor and mucosal edema.   Mouth:     pharyngeal erythema, pharyngeal exudate, and posterior lymphoid hypertrophy.   Neck:     supple.   Lungs:     Normal respiratory effort, chest expands symmetrically. Lungs are clear to auscultation, no crackles or wheezes. Heart:     no murmur, irregular rhythm, and  Grade   2/6 systolic ejection murmur.   Abdomen:     Bowel sounds positive,abdomen soft and non-tender without masses, organomegaly or hernias noted.    Impression & Recommendations:  Problem # 1:  DIABETES MELLITUS, TYPE II (ICD-250.00)  The following medications were removed from the medication list:    Glimepiride 1 Mg Tabs (Glimepiride) ..... Once daily  Her updated medication list for this problem includes:    Micardis Hct 40-12.5 Mg Tabs (Telmisartan-hctz) .[name]... 1/2 once daily    Metformin Hcl 500 Mg Tb24 (Metformin hcl) ..... One by mouth daily  ( change to the er)    Amaryl 2 Mg Tabs (Glimepiride) .[name]... Take one (1) by mouth once a day  Labs Reviewed: HgBA1c: 7.5 (08/07/2007)   Creat: 0.7 (08/07/2007)      Problem # 2:  ATRIAL FIBRILLATION (ICD-427.31)  The  following medications were removed from the medication list:    Warfarin Sodium 5 Mg Tabs (Warfarin sodium) .[name]... 5,10.  Her updated medication list for this problem includes:    Metoprolol Tartrate 50 Mg Tabs (Metoprolol tartrate) .[name]..[name] Two times a day    Warfarin Sodium 5 Mg Tabs (Warfarin sodium) .[name]... 5,7 1/2    Digoxin 0.125 Mg Tabs (Digoxin) .[name]... Take 1 by mouth qd  Reviewed the following: PT: 19.7 (10/09/2007)   INR: 2.6 (10/09/2007) Next Protime: 4 weeks (dated on 10/09/2007)  Orders: Protime (98119JY) Fingerstick (78295)   Complete Medication List: 1)  Evista 60 Mg Tabs (Raloxifene hcl) .... Once daily 2)  Micardis Hct 40-12.5 Mg Tabs (Telmisartan-hctz) .... 1/2 once daily 3)  Cardizem Cd 240 Mg Cp24 (Diltiazem hcl coated beads) .... Once daily 4)  Klor-con 8 Meq Tbcr (Potassium chloride) .... Once daily 5)  Oscal 500/200 D-3 500-200 Mg-unit Tabs (Calcium-vitamin d) .[name].. 1650 total; 6)  Magnesium Oxide 400 Mg Caps (Magnesium oxide) .... 2 once daily 7)  Reclast 5 Mg/135ml Soln (Zoledronic acid) .... Yearly 8)  Metformin Hcl 500 Mg Tb24 (Metformin hcl) .... One by mouth daily  ( change to the er) 9)  Metoprolol Tartrate 50 Mg Tabs (Metoprolol tartrate) .... Two times a day 10)  Warfarin Sodium 5 Mg Tabs (Warfarin sodium) .... 5,7 1/2 11)  Digoxin 0.125 Mg Tabs (Digoxin) .... Take 1 by mouth qd 12)  Amaryl 2 Mg Tabs (Glimepiride) .... Take one (1) by mouth once a day   Patient Instructions: 1)  Please schedule a follow-up appointment in 2 months. 2)  BMP prior to visit, ICD-9:  401.90 3)  Hepatic Panel prior to visit, ICD-9:995.20 4)  Lipid Panel prior to visit, ICD-9:272.4 5)  HbgA1C prior to visit, ICD-9:250.00   Prescriptions: METFORMIN HCL 500 MG  TB24 (METFORMIN HCL) one by mouth daily  ( change to the ER)  #30 x 11   Entered and Authorized by:   Stacie Glaze MD   Signed by:   Stacie Glaze MD on 11/07/2007   Method used:   Electronically sent to ...        [facility]  [phone]*       [address]       [place], [place]  [address]       Ph: [phone] or [phone]       Fax: [phone]   RxID:   [id number] AMARYL 2 MG TABS (GLIMEPIRIDE) Take one (1) by mouth once a day #30 x 11   Entered and Authorized by:   Stacie Glaze MD  Signed by:   Stacie Glaze MD on 11/07/2007   Method used:   Electronically sent to ...       [facility]  [phone]*       [address]       [place], [place]  [address]       Ph: [phone] or [phone]       Fax: [phone]   RxID:   [facility]  ]

## 2010-06-23 NOTE — Assessment & Plan Note (Signed)
Summary: FLU SHOT//SLM   Nurse Visit   Review of Systems       Flu Vaccine Consent Questions     Do you have a history of severe allergic reactions to this vaccine? no    Any prior history of allergic reactions to egg and/or gelatin? no    Do you have a sensitivity to the preservative Thimersol? no    Do you have a past history of Guillan-Barre Syndrome? no    Do you currently have an acute febrile illness? no    Have you ever had a severe reaction to latex? no    Vaccine information given and explained to patient? yes    Are you currently pregnant? no    Lot Number:[id number]   Exp Date:11/20/2009   Site Given  Left Deltoid IM     Allergies: 1)  ! Codeine 2)  Codeine Sulfate (Codeine Sulfate) 3)  Codeine Sulfate (Codeine Sulfate) 4)  Codeine Sulfate (Codeine Sulfate)  Orders Added: 1)  Flu Vaccine 7yrs + [78242] 2)  Administration Flu vaccine - MCR [G0008]

## 2010-06-23 NOTE — Assessment & Plan Note (Signed)
Summary: pt/njr   Nurse Visit   Vital Signs:  Patient Profile:   77 Years Old Female Height:     64 inches Pulse rate:   57 / minute BP sitting:   123 / 62  (left arm)                 Prior Medications: EVISTA 60 MG  TABS (RALOXIFENE HCL) once daily MICARDIS HCT 40-12.5 MG  TABS (TELMISARTAN-HCTZ) 1/2 once daily CARDIZEM CD 240 MG  CP24 (DILTIAZEM HCL COATED BEADS) once daily KLOR-CON 8 MEQ  TBCR (POTASSIUM CHLORIDE) once daily OSCAL 500/200 D-3 500-200 MG-UNIT  TABS (CALCIUM-VITAMIN D) 1650 TOTAL; MAGNESIUM OXIDE 400 MG  CAPS (MAGNESIUM OXIDE) 2 once daily RECLAST 5 MG/100ML  SOLN (ZOLEDRONIC ACID) yearly METFORMIN HCL 500 MG  TB24 (METFORMIN HCL) 1 three times a day METOPROLOL TARTRATE 50 MG TABS (METOPROLOL TARTRATE) two times a day WARFARIN SODIUM 5 MG TABS (WARFARIN SODIUM) 5,7 1/2 DIGOXIN 0.125 MG  TABS (DIGOXIN) take 1 by mouth qd ALPRAZOLAM 0.25 MG  TABS (ALPRAZOLAM) one by mouth q HS TRUETEST TEST   STRP (GLUCOSE BLOOD) two times a day ONGLYZA 5 MG () one by mouth daily Current Allergies: ! CODEINE CODEINE SULFATE (CODEINE SULFATE) CODEINE SULFATE (CODEINE SULFATE) CODEINE SULFATE (CODEINE SULFATE) Laboratory Results   Blood Tests     PT: 18.8 s   (Normal Range: 10.6-13.4)  INR: 2.4   (Normal Range: 0.88-1.12   Therap INR: 2.0-3.5) Comments: Rita Ohara  June 21, 2008 2:26 PM       Orders Added: 1)  Est. Patient Level I [99211] 2)  Fingerstick [36416] 3)  Protime Ila.Stager    ]  Vital Signs:  Patient Profile:   77 Years Old Female Height:     64 inches Pulse rate:   57 / minute BP sitting:   123 / 62                   ANTICOAGULATION RECORD PREVIOUS REGIMEN & LAB RESULTS Anticoagulation Diagnosis:  v58.83,v58.61,427.31 on  05/31/2008 Previous INR Goal Range:  2.0-3.0 on  05/31/2008 Previous INR:  1.4 on  05/31/2008 Previous Coumadin Dose(mg):  7.5mg  on m,w,f 5mg  other days on  05/31/2008 Previous Regimen:  5mg  on m,w,f  7.5mg  other days on  05/31/2008 Previous Coagulation Comments:  Dr. Kirtland Bouchard approved on  02/02/2008  NEW REGIMEN & LAB RESULTS Current INR: 2.4 Regimen: 5mg  on m,w,f 7.5mg  other days  (no change)  Repeat testing in: 4 weeks  Anticoagulation Visit Questionnaire Coumadin dose missed/changed:  No Abnormal Bleeding Symptoms:  No  Any diet changes including alcohol intake, vegetables or greens since the last visit:  No Any illnesses or hospitalizations since the last visit:  No Any signs of clotting since the last visit (including chest discomfort, dizziness, shortness of breath, arm tingling, slurred speech, swelling or redness in leg):  No  MEDICATIONS EVISTA 60 MG  TABS (RALOXIFENE HCL) once daily MICARDIS HCT 40-12.5 MG  TABS (TELMISARTAN-HCTZ) 1/2 once daily CARDIZEM CD 240 MG  CP24 (DILTIAZEM HCL COATED BEADS) once daily KLOR-CON 8 MEQ  TBCR (POTASSIUM CHLORIDE) once daily OSCAL 500/200 D-3 500-200 MG-UNIT  TABS (CALCIUM-VITAMIN D) 1650 TOTAL; MAGNESIUM OXIDE 400 MG  CAPS (MAGNESIUM OXIDE) 2 once daily RECLAST 5 MG/100ML  SOLN (ZOLEDRONIC ACID) yearly METFORMIN HCL 500 MG  TB24 (METFORMIN HCL) 1 three times a day METOPROLOL TARTRATE 50 MG TABS (METOPROLOL TARTRATE) two times a day WARFARIN SODIUM 5 MG TABS (WARFARIN SODIUM) 5,7  1/2 DIGOXIN 0.125 MG  TABS (DIGOXIN) take 1 by mouth qd ALPRAZOLAM 0.25 MG  TABS (ALPRAZOLAM) one by mouth q HS TRUETEST TEST   STRP (GLUCOSE BLOOD) two times a day * ONGLYZA 5 MG one by mouth daily

## 2010-06-23 NOTE — Progress Notes (Signed)
Summary: needs refill showing testing 3 to 4 times a day   Phone Note Call from Patient Call back at Home Phone [phone]   Caller: pt live Call For: Lovell Sheehan Reason for Call: Acute Illness Summary of Call: She uses  true results monitor test strips say tru test.  She told you she needed a refill when she was in last week.  She has been testing 3 or 4 times a day rather than once and she is out.  She has no refills left at the drugstore.  [facility]  Initial call taken by: Roselle Locus,  February 27, 2008 2:46 PM  Follow-up for Phone Call        calld pharmacy and refilled Follow-up by: Willy Eddy, LPN,  February 27, 2008 3:17 PM

## 2010-06-23 NOTE — Progress Notes (Signed)
Summary: increase number  Phone Note Call from Patient Call back at [phone]   Caller: pt live Call For: Stacie Glaze MD Summary of Call: Patient needs a new rx for metformin hcl 500mg  and Dr Althimer wants to increase it to 3 aday,  onglyza rx refill and she need 7 weeks he gave her samples and she needs 7 weeks of samples. Initial call taken by: Celine Ahr,  June 07, 2008 10:29 AM    New/Updated Medications: METFORMIN HCL 500 MG  TB24 (METFORMIN HCL) 1 three times a day   Prescriptions: METFORMIN HCL 500 MG  TB24 (METFORMIN HCL) 1 three times a day  #90 x 3   Entered by:   Willy Eddy, LPN   Authorized by:   Stacie Glaze MD   Signed by:   Willy Eddy, LPN on 04/54/0981   Method used:   Electronically to        [facility]  [phone]* (retail)       [address] [place], [place]  [address]       Ph: [phone] or [phone]       Fax: [phone]   RxID:   [id number]   Appended Document: increase number pt informed that script was sent in for three times a day and onglyza samples out front

## 2010-06-23 NOTE — Assessment & Plan Note (Signed)
Summary: pt/njr   Nurse Visit     Allergies: 1)  ! Codeine 2)  Codeine Sulfate (Codeine Sulfate) 3)  Codeine Sulfate (Codeine Sulfate) 4)  Codeine Sulfate (Codeine Sulfate)  Laboratory Results   Blood Tests     PT: 13.7 s   (Normal Range: 10.6-13.4)  INR: 1.2   (Normal Range: 0.88-1.12   Therap INR: 2.0-3.5) Comments: [name] CMA  September 16, 2008 2:12 PM       Orders Added: 1)  Est. Patient Level I [99211] 2)  Protime [04540JW] 3)  Fingerstick [11914]      ANTICOAGULATION RECORD PREVIOUS REGIMEN & LAB RESULTS Anticoagulation Diagnosis:  v58.83,v58.61,427.31 on  05/31/2008 Previous INR Goal Range:  2.0-3.0 on  05/31/2008 Previous INR:  1.6 on  08/19/2008 Previous Coumadin Dose(mg):  7.5mg  on m,w,f 5mg  other days on  05/31/2008 Previous Regimen:  7.5mg . Tues. Thurs. Sat. Sun. others 5mg . on  08/19/2008 Previous Coagulation Comments:  Dr. Kirtland Bouchard approved on  02/02/2008  NEW REGIMEN & LAB RESULTS Current INR: 1.2 Regimen: 7.5mg  everyday. RTO 2 weeks.   Anticoagulation Visit Questionnaire Coumadin dose missed/changed:  No Abnormal Bleeding Symptoms:  No  Any diet changes including alcohol intake, vegetables or greens since the last visit:  No Any illnesses or hospitalizations since the last visit:  No Any signs of clotting since the last visit (including chest discomfort, dizziness, shortness of breath, arm tingling, slurred speech, swelling or redness in leg):  No  MEDICATIONS EVISTA 60 MG  TABS (RALOXIFENE HCL) once daily MICARDIS 40 MG TABS (TELMISARTAN) one by mouth daily CARDIZEM CD 240 MG  CP24 (DILTIAZEM HCL COATED BEADS) once daily KLOR-CON 8 MEQ  TBCR (POTASSIUM CHLORIDE) once daily OSCAL 500/200 D-3 500-200 MG-UNIT  TABS (CALCIUM-VITAMIN D) 1650 TOTAL; MAGNESIUM OXIDE 400 MG  CAPS (MAGNESIUM OXIDE) 2 once daily RECLAST 5 MG/100ML  SOLN (ZOLEDRONIC ACID) yearly METFORMIN HCL 500 MG  TB24 (METFORMIN HCL) 2 two times a day METOPROLOL TARTRATE  50 MG TABS (METOPROLOL TARTRATE) two times a day WARFARIN SODIUM 5 MG TABS (WARFARIN SODIUM) 7.5 for 3 days and 5 all others DIGOXIN 0.125 MG  TABS (DIGOXIN) take 1 by mouth qd ALPRAZOLAM 0.25 MG  TABS (ALPRAZOLAM) one by mouth q HS TRUETEST TEST   STRP (GLUCOSE BLOOD) two times a day * ONGLYZA 5 MG one by mouth daily ACTOS 45 MG TABS (PIOGLITAZONE HCL) 1 once daily CRESTOR 5 MG TABS (ROSUVASTATIN CALCIUM) 1 by mouth  MWF weekly MULTIVITAMINS  TABS (MULTIPLE VITAMIN) 1 once daily VITAMIN C CR 500 MG CR-CAPS (ASCORBIC ACID) 1 once daily

## 2010-06-23 NOTE — Letter (Signed)
Summary: Dr Altheimer note  Dr Altheimer note   Imported By: Kassie Mends 12/12/2008 08:54:08  _____________________________________________________________________  External Attachment:    Type:   Image     Comment:   External Document

## 2010-06-23 NOTE — Progress Notes (Signed)
Summary: BS 301  Phone Note Call from Patient Call back at Home Phone [phone]   Caller: Patient Call For: Lovell Sheehan Summary of Call: Pt BS this AM was 247, unusual for her as it is usually 126-130.  Two hours after having something to eat it was 301.  She reports she has never had a BS like that.  Pt had planned to leave for the weekend, will not leave until she hears back from Dr Lovell Sheehan. Initial call taken by: Sid Falcon LPN,  January 11, 2008 1:03 PM  Follow-up for Phone Call        increaed amaryl to 4 yesterday --per dr Lovell Sheehan give it time and go to mountains and call if needed. pt informed Follow-up by: Willy Eddy, LPN,  January 11, 2008 1:41 PM        Appended Document: BS 301 Pt wonders if Dr. Lovell Sheehan remembers she had a steroid joint injection (knee) on Monday.  Her higest BS has been 389.  Any suggestions?  [phone]  Appended Document: BS 301 Pt still complaining of BSs in the 300's.  Per Dr. Lovell Sheehan .[name]..increase Metformin to 1000 mg. and give it 3 days to come down.

## 2010-06-23 NOTE — Letter (Signed)
Summary: [facility]  [facility]   Imported By: Maryln Gottron 12/29/2009 11:06:28  _____________________________________________________________________  External Attachment:    Type:   Image     Comment:   External Document

## 2010-06-23 NOTE — Assessment & Plan Note (Signed)
Summary: 3 mo rov/mm   Vital Signs:  Patient profile:   77 year old female Height:      64 inches Weight:      160 pounds Temp:     98.2 degrees F oral Pulse rate:   64 / minute Resp:     14 per minute BP sitting:   136 / 60  (left arm)  Vitals Entered By: Willy Eddy, LPN (September 17, 2009 11:33 AM) CC: roa-c/o diastolic  being low and she gets faint, Hypertension Management   Primary Care Provider:  Stacie Glaze MD  CC:  roa-c/o diastolic  being low and she gets faint and Hypertension Management.  History of Present Illness: pt presents for folow up of multiple medical problems  Hypertension History:      She denies headache, chest pain, palpitations, dyspnea with exertion, orthopnea, PND, peripheral edema, visual symptoms, neurologic problems, syncope, and side effects from treatment.        Positive major cardiovascular risk factors include female age 55 years old or older, diabetes, hyperlipidemia, and hypertension.  Negative major cardiovascular risk factors include negative family history for ischemic heart disease and non-tobacco-user status.     Preventive Screening-Counseling & Management  Alcohol-Tobacco     Smoking Status: never     Passive Smoke Exposure: no  Problems Prior to Update: 1)  Hysterectomy  (ICD-V88.01) 2)  Left Shoulder Surgery  () 3)  Adenocarcinoma, Breast, Hx of  (ICD-V10.3) 4)  Syncope, Hx of  (ICD-V12.49) 5)  Aortic Sclerosis  (ICD-424.1) 6)  Fracture, Pelvis, Left, Hx of  (ICD-808.8) 7)  Fracture, Femur, Right, Hx of  (ICD-821.00) 8)  Mastectomy, Left, Hx of  (ICD-V45.71) 9)  Coumadin Therapy  (ICD-V58.61) 10)  Encounter For Long-term Use of Anticoagulants  (ICD-V58.61) 11)  Atrial Fibrillation  (ICD-427.31) 12)  Hyperlipidemia, With High Hdl  (ICD-272.4) 13)  Atrial Fibrillation  (ICD-427.31) 14)  Coumadin Therapy  (ICD-V58.61) 15)  Encounter For Therapeutic Drug Monitoring  (ICD-V58.83) 16)  Acute Sinusitis, Unspecified   (ICD-461.9) 17)  Uri  (ICD-465.9) 18)  Osteoporosis  (ICD-733.00) 19)  Osteoarthritis  (ICD-715.90) 20)  Hypertension  (ICD-401.9) 21)  Diabetes Mellitus, Type II  (ICD-250.00) 22)  Atrial Fibrillation  (ICD-427.31) 23)  Osteoporosis  (ICD-733.00) 24)  Hypertension  (ICD-401.9) 25)  Diabetes Mellitus, Type II  (ICD-250.00) 26)  Atrial Fibrillation  (ICD-427.31) 27)  Coumadin Therapy  (ICD-V58.61) 28)  Encounter For Therapeutic Drug Monitoring  (ICD-V58.83)  Current Problems (verified): 1)  Hysterectomy  (ICD-V88.01) 2)  Left Shoulder Surgery  () 3)  Adenocarcinoma, Breast, Hx of  (ICD-V10.3) 4)  Syncope, Hx of  (ICD-V12.49) 5)  Aortic Sclerosis  (ICD-424.1) 6)  Fracture, Pelvis, Left, Hx of  (ICD-808.8) 7)  Fracture, Femur, Right, Hx of  (ICD-821.00) 8)  Mastectomy, Left, Hx of  (ICD-V45.71) 9)  Coumadin Therapy  (ICD-V58.61) 10)  Encounter For Long-term Use of Anticoagulants  (ICD-V58.61) 11)  Atrial Fibrillation  (ICD-427.31) 12)  Hyperlipidemia, With High Hdl  (ICD-272.4) 13)  Atrial Fibrillation  (ICD-427.31) 14)  Coumadin Therapy  (ICD-V58.61) 15)  Encounter For Therapeutic Drug Monitoring  (ICD-V58.83) 16)  Acute Sinusitis, Unspecified  (ICD-461.9) 17)  Uri  (ICD-465.9) 18)  Osteoporosis  (ICD-733.00) 19)  Osteoarthritis  (ICD-715.90) 20)  Hypertension  (ICD-401.9) 21)  Diabetes Mellitus, Type II  (ICD-250.00) 22)  Atrial Fibrillation  (ICD-427.31) 23)  Osteoporosis  (ICD-733.00) 24)  Hypertension  (ICD-401.9) 25)  Diabetes Mellitus, Type II  (ICD-250.00) 26)  Atrial  Fibrillation  (ICD-427.31) 27)  Coumadin Therapy  (ICD-V58.61) 28)  Encounter For Therapeutic Drug Monitoring  (ICD-V58.83)  Medications Prior to Update: 1)  Evista 60 Mg  Tabs (Raloxifene Hcl) .... Once Daily 2)  Micardis 40 Mg Tabs (Telmisartan) .... One By Mouth Daily 3)  Cardizem Cd 240 Mg  Cp24 (Diltiazem Hcl Coated Beads) .... Once Daily 4)  Klor-Con 8 Meq  Tbcr (Potassium Chloride) .... Once  Daily 5)  Caltrate 600+d 600-400 Mg-Unit Tabs (Calcium Carbonate-Vitamin D) .[name].. 1 Two Times A Day 6)  Magnesium 250 Mg Tabs (Magnesium) .... Three Times A Day 7)  Reclast 5 Mg/133ml  Soln (Zoledronic Acid) .... Yearly 8)  Metformin Hcl 500 Mg  Tb24 (Metformin Hcl) .... 2 Tabs Two Times A Day 9)  Warfarin Sodium 5 Mg Tabs (Warfarin Sodium) .... 7.5, Daily 10)  Digoxin 0.125 Mg  Tabs (Digoxin) .... Take 1 By Mouth Qd 11)  Alprazolam 0.25 Mg  Tabs (Alprazolam) .... One By Mouth Q Hs 12)  Truetest Test   Strp (Glucose Blood) .... Two Times A Day 13)  Onglyza 5 Mg .... One By Mouth Daily 14)  Actos 45 Mg Tabs (Pioglitazone Hcl) .[name].. 1 Once Daily 15)  Crestor 5 Mg Tabs (Rosuvastatin Calcium) .[name].. 1 By Mouth  Mwf Weekly 16)  Multivitamins  Tabs (Multiple Vitamin) .[name].. 1 Once Daily 17)  Vitamin C Cr 500 Mg Cr-Caps (Ascorbic Acid) .[name].. 1 Once Daily 18)  Vitamin D 2000 Unit Tabs (Cholecalciferol) .[name].. 1 Once Daily 19)  B Complex-B12  Tabs (B Complex Vitamins) .[name].. 1 Once Daily  Current Medications (verified): 1)  Evista 60 Mg  Tabs (Raloxifene Hcl) .... Once Daily 2)  Micardis 40 Mg Tabs (Telmisartan) .... One By Mouth Daily 3)  Cardizem Cd 240 Mg  Cp24 (Diltiazem Hcl Coated Beads) .... Once Daily 4)  Klor-Con 8 Meq  Tbcr (Potassium Chloride) .... Once Daily 5)  Caltrate 600+d 600-400 Mg-Unit Tabs (Calcium Carbonate-Vitamin D) .[name].. 1 Two Times A Day 6)  Magnesium 250 Mg Tabs (Magnesium) .... Three Times A Day 7)  Reclast 5 Mg/189ml  Soln (Zoledronic Acid) .... Yearly 8)  Metformin Hcl 500 Mg  Tb24 (Metformin Hcl) .... 2 Tabs Two Times A Day 9)  Warfarin Sodium 5 Mg Tabs (Warfarin Sodium) .... 7.5, Daily 10)  Digoxin 0.125 Mg  Tabs (Digoxin) .... Take 1 By Mouth Qd 11)  Alprazolam 0.25 Mg  Tabs (Alprazolam) .... One By Mouth Q Hs 12)  Truetest Test   Strp (Glucose Blood) .... Two Times A Day 13)  Onglyza 5 Mg .... One By Mouth Daily 14)  Actos 45 Mg Tabs (Pioglitazone Hcl) .[name].. 1 Once Daily 15)   Crestor 5 Mg Tabs (Rosuvastatin Calcium) .[name].. 1 By Mouth  Mwf Weekly 16)  Multivitamins  Tabs (Multiple Vitamin) .[name].. 1 Once Daily 17)  Vitamin C Cr 500 Mg Cr-Caps (Ascorbic Acid) .[name].. 1 Once Daily 18)  Vitamin D 2000 Unit Tabs (Cholecalciferol) .[name].. 1 Once Daily 19)  B Complex-B12  Tabs (B Complex Vitamins) .[name].. 1 Once Daily  Allergies (verified): 1)  ! Codeine 2)  Codeine Sulfate (Codeine Sulfate) 3)  Codeine Sulfate (Codeine Sulfate) 4)  Codeine Sulfate (Codeine Sulfate)  Past History:  Family History: Last updated: 05/08/2007 mother CHF  at 21 father MI at 15  Social History: Last updated: 05/08/2007 Married Never Smoked Alcohol use-no Drug use-no Regular exercise-yes  Risk Factors: Exercise: yes (05/08/2007)  Risk Factors: Smoking Status: never (09/17/2009) Passive Smoke Exposure: no (  09/17/2009)  Past medical, surgical, family and social histories (including risk factors) reviewed, and no changes noted (except as noted below).  Past Medical History: Reviewed history from 04/07/2009 and no changes required. ADENOCARCINOMA, BREAST, HX OF (ICD-V10.3) SYNCOPE, HX OF (ICD-V12.49 Nuclear stress... April, 2008... no ischemia... EF 65% EF  60%... echo.. February, 2008 Aortic valve sclerosis.... echo.... mild FRACTURE, PELVIS, LEFT, HX OF (ICD-808.8) FRACTURE, FEMUR, RIGHT, HX OF (ICD-821.00) COUMADIN THERAPY (ICD-V58.61) ENCOUNTER FOR LONG-TERM USE OF ANTICOAGULANTS (ICD-V58.61) ATRIAL FIBRILLATION (ICD-427.31)...infrequent.. paroxysmal.. Coumadin therapy HYPERLIPIDEMIA, WITH HIGH HDL (ICD-272.4) ENCOUNTER FOR THERAPEUTIC DRUG MONITORING (ICD-V58.83) ACUTE SINUSITIS, UNSPECIFIED (ICD-461.9) URI (ICD-465.9) OSTEOPOROSIS (ICD-733.00) OSTEOARTHRITIS (ICD-715.90) HYPERTENSION (ICD-401.9) DIABETES MELLITUS, TYPE II (ICD-250.00) HYPERTENSION (ICD-401.9) DIABETES MELLITUS, TYPE II (ICD-250.00)    Past Surgical History: Reviewed history from 12/17/2008 and no  changes required. HYSTERECTOMY (ICD-V88.01) * LEFT SHOULDER SURGERY MASTECTOMY, LEFT, HX OF (ICD-V45.71)  Family History: Reviewed history from 05/08/2007 and no changes required. mother CHF  at 67 father MI at 20  Social History: Reviewed history from 05/08/2007 and no changes required. Married Never Smoked Alcohol use-no Drug use-no Regular exercise-yes  Review of Systems  The patient denies anorexia, fever, weight loss, weight gain, vision loss, decreased hearing, hoarseness, chest pain, syncope, dyspnea on exertion, peripheral edema, prolonged cough, headaches, hemoptysis, abdominal pain, melena, hematochezia, severe indigestion/heartburn, hematuria, incontinence, genital sores, muscle weakness, suspicious skin lesions, transient blindness, difficulty walking, depression, unusual weight change, abnormal bleeding, enlarged lymph nodes, angioedema, breast masses, and testicular masses.    Physical Exam  General:  Well-developed,well-nourished,in no acute distress; alert,appropriate and cooperative throughout examination Head:  Normocephalic and atraumatic without obvious abnormalities. No apparent alopecia or balding. Eyes:  pupils equal and pupils round.   Nose:  no external deformity and nasal dischargemucosal pallor.   Mouth:  pharynx pink and moist and no erythema.   Neck:  No deformities, masses, or tenderness noted. Lungs:  Normal respiratory effort, chest expands symmetrically. Lungs are clear to auscultation, no crackles or wheezes. Heart:  no murmur, irregular rhythm, and Grade   2/6 systolic ejection murmur.   Abdomen:  Bowel sounds positive,abdomen soft and non-tender without masses, organomegaly or hernias noted.   Impression & Recommendations:  Problem # 1:  HYPERLIPIDEMIA, WITH HIGH HDL (ICD-272.4) the pt had a lipomed Altheimer did a lipimed profile for LDL-p  less than 1000 and LP score 45 Her updated medication list for this problem includes:    Crestor 5  Mg Tabs (Rosuvastatin calcium) .[name]... 1 by mouth  mwf weekly  Labs Reviewed: SGOT: 7 (11/27/2008)   SGPT: 17 (11/27/2008)  Lipid Goals: Chol Goal: 200 (03/10/2009)   HDL Goal: 40 (03/10/2009)   LDL Goal: 100 (03/10/2009)   TG Goal: 150 (03/10/2009)  Prior 10 Yr Risk Heart Disease: 13 % (06/18/2009)   HDL:70 (06/13/2009), 62 (03/10/2009)  LDL:81 (06/13/2009), 62 (03/10/2009)  Chol:159 (06/13/2009), 127 (03/10/2009)  Trig:131 (06/13/2009), 18 (03/10/2009)  Problem # 2:  ATRIAL FIBRILLATION (ICD-427.31) some discussion of going off coumadin to newer medicine Her updated medication list for this problem includes:    Cardizem Cd 240 Mg Cp24 (Diltiazem hcl coated beads) ..... Once daily    Warfarin Sodium 5 Mg Tabs (Warfarin sodium) .[name]... 7.5, daily    Digoxin 0.125 Mg Tabs (Digoxin) .[name]... Take 1 by mouth qd  Reviewed the following: PT: 19.2 (09/03/2009)   INR: 2.5 (09/03/2009) Next Protime: 4 wks (dated on 09/03/2009)  Problem # 3:  HYPERTENSION (ICD-401.9) the HR is 54 Her updated medication list for  this problem includes:    Micardis 40 Mg Tabs (Telmisartan) ..... One by mouth daily    Cardizem Cd 240 Mg Cp24 (Diltiazem hcl coated beads) ..... Once daily  BP today: 136/60 Prior BP: 142/80 (06/18/2009)  Prior 10 Yr Risk Heart Disease: 13 % (06/18/2009)  Labs Reviewed: K+: 3.8 (11/27/2008) Creat: : 0.7 (04/22/2008)   Chol: 159 (06/13/2009)   HDL: 70 (06/13/2009)   LDL: 81 (06/13/2009)   TG: 131 (06/13/2009)  Problem # 4:  DIABETES MELLITUS, TYPE II (ICD-250.00)  Her updated medication list for this problem includes:    Micardis 40 Mg Tabs (Telmisartan) ..... One by mouth daily    Metformin Hcl 500 Mg Tb24 (Metformin hcl) .[name]... 2 tabs two times a day    Actos 45 Mg Tabs (Pioglitazone hcl) .[name]... 1 once daily  Complete Medication List: 1)  Evista 60 Mg Tabs (Raloxifene hcl) .... Once daily 2)  Micardis 40 Mg Tabs (Telmisartan) .... One by mouth daily 3)  Cardizem Cd 240 Mg Cp24  (Diltiazem hcl coated beads) .... Once daily 4)  Klor-con 8 Meq Tbcr (Potassium chloride) .... Once daily 5)  Caltrate 600+d 600-400 Mg-unit Tabs (Calcium carbonate-vitamin d) .[name].. 1 two times a day 6)  Magnesium 250 Mg Tabs (Magnesium) .... Three times a day 7)  Reclast 5 Mg/132ml Soln (Zoledronic acid) .... Yearly 8)  Metformin Hcl 500 Mg Tb24 (Metformin hcl) .... 2 tabs two times a day 9)  Warfarin Sodium 5 Mg Tabs (Warfarin sodium) .... 7.5, daily 10)  Digoxin 0.125 Mg Tabs (Digoxin) .... Take 1 by mouth qd 11)  Alprazolam 0.25 Mg Tabs (Alprazolam) .... One by mouth q hs 12)  Truetest Test Strp (Glucose blood) .... Two times a day 13)  Onglyza 5 Mg  .... One by mouth daily 14)  Actos 45 Mg Tabs (Pioglitazone hcl) .[name].. 1 once daily 15)  Crestor 5 Mg Tabs (Rosuvastatin calcium) .[name].. 1 by mouth  mwf weekly 16)  Multivitamins Tabs (Multiple vitamin) .[name].. 1 once daily 17)  Vitamin C Cr 500 Mg Cr-caps (Ascorbic acid) .[name].. 1 once daily 18)  Vitamin D 2000 Unit Tabs (Cholecalciferol) .[name].. 1 once daily 19)  B Complex-b12 Tabs (B complex vitamins) .[name].. 1 once daily  Hypertension Assessment/Plan:      The patient's hypertensive risk group is category C: Target organ damage and/or diabetes.  Her calculated 10 year risk of coronary heart disease is 17 %.  Today's blood pressure is 136/60.  Her blood pressure goal is < 130/80.  Patient Instructions: 1)  Please schedule a follow-up appointment in 3 months.

## 2010-06-23 NOTE — Assessment & Plan Note (Signed)
Summary: 3 month roa   Vital Signs:  Patient Profile:   77 Years Old Female Height:     64 inches Weight:      172 pounds Temp:     98.2 degrees F oral Pulse rate:   80 / minute Resp:     14 per minute BP sitting:   140 / 70  (left arm)  Vitals Entered By: Willy Eddy, LPN (August 07, 2007 10:21 AM)                 Chief Complaint:  roa.  History of Present Illness: Current Problems:  follow up visit URI (ICD-465.9) OSTEOPOROSIS (ICD-733.00) OSTEOARTHRITIS (ICD-715.90) HYPERTENSION (ICD-401.9)  stable DIABETES MELLITUS, TYPE II (ICD-250.00) ATRIAL FIBRILLATION (ICD-427.31) OSTEOPOROSIS (ICD-733.00) HYPERTENSION (ICD-401.9)stable DIABETES MELLITUS, TYPE II (ICD-250.00)  the cbgs are stable ATRIAL FIBRILLATION (ICD-427.31)  rate stable COUMADIN THERAPY (ICD-V58.61)  due protime toda ENCOUNTER FOR THERAPEUTIC DRUG MONITORING (ICD-V58.83)    Diabetes Management History:      She is (or has been) enrolled in the "Diabetic Education Program".  She is checking home blood sugars.  She says that she is exercising.       Current Allergies: ! CODEINE CODEINE SULFATE (CODEINE SULFATE) CODEINE SULFATE (CODEINE SULFATE) CODEINE SULFATE (CODEINE SULFATE)  Past Medical History:    Reviewed history from 05/08/2007 and no changes required:       Atrial fibrillation       Diabetes mellitus, type II       Hypertension       Osteoarthritis       Osteoporosis  Past Surgical History:    Reviewed history from 05/08/2007 and no changes required:       Hysterectomy       Mastectomy       shoulder dislocation          Family History:    Reviewed history from 05/08/2007 and no changes required:       mother CHF  at 55       father MI at 70  Social History:    Reviewed history from 05/08/2007 and no changes required:       Married       Never Smoked       Alcohol use-no       Drug use-no       Regular exercise-yes    Review of Systems  The patient denies  anorexia, fever, weight loss, weight gain, vision loss, decreased hearing, hoarseness, chest pain, syncope, dyspnea on exhertion, peripheral edema, prolonged cough, hemoptysis, abdominal pain, melena, hematochezia, severe indigestion/heartburn, hematuria, incontinence, genital sores, muscle weakness, suspicious skin lesions, transient blindness, difficulty walking, depression, unusual weight change, abnormal bleeding, enlarged lymph nodes, angioedema, breast masses, and testicular masses.     Physical Exam  General:     Well-developed,well-nourished,in no acute distress; alert,appropriate and cooperative throughout examination Head:     normocephalic.   Ears:     External ear exam shows no significant lesions or deformities.  Otoscopic examination reveals clear canals, tympanic membranes are intact bilaterally without bulging, retraction, inflammation or discharge. Hearing is grossly normal bilaterally. Nose:     nasal dischargemucosal pallor and mucosal edema.   Mouth:     pharyngeal erythema, pharyngeal exudate, and posterior lymphoid hypertrophy.   Neck:     supple.   Lungs:     Normal respiratory effort, chest expands symmetrically. Lungs are clear to auscultation, no crackles or wheezes. Heart:     no  murmur, irregular rhythm, and Grade   2/6 systolic ejection murmur.   Abdomen:     Bowel sounds positive,abdomen soft and non-tender without masses, organomegaly or hernias noted.    Impression & Recommendations:  Problem # 1:  HYPERTENSION (ICD-401.9)  The following medications were removed from the medication list:    Hydrochlorothiazide 25 Mg Tabs (Hydrochlorothiazide) .[name]... Take 1/2 tablet by mouth every morning    Metoprolol Succinate 50 Mg Tb24 (Metoprolol succinate) ..... Once daily    Micardis 20 Mg Tabs (Telmisartan) .[name]... Take 1/2 tab by mouth qd    Cardizem Cd 240 Mg Cp24 (Diltiazem hcl coated beads) .[name]... Take 1 by mouth qd  Her updated medication list for this  problem includes:    Micardis Hct 40-12.5 Mg Tabs (Telmisartan-hctz) .[name]... 1/2 once daily    Cardizem Cd 240 Mg Cp24 (Diltiazem hcl coated beads) ..... Once daily    Metoprolol Tartrate 50 Mg Tabs (Metoprolol tartrate) .[name]..[name] Two times a day  BP today: 140/70 Prior BP: 144/64 (07/04/2007)  Labs Reviewed: Creat: 0.7 (05/08/2007)  Orders: TLB-BMP (Basic Metabolic Panel-BMET) (80048-METABOL)   Problem # 2:  ATRIAL FIBRILLATION (ICD-427.31)  The following medications were removed from the medication list:    Metoprolol Succinate 50 Mg Tb24 (Metoprolol succinate) ..... Once daily    Warfarin Sodium 5 Mg Tabs (Warfarin sodium) .[name]... Take as directed  Her updated medication list for this problem includes:    Warfarin Sodium 5 Mg Tabs (Warfarin sodium) .[name]... 5,10.    Metoprolol Tartrate 50 Mg Tabs (Metoprolol tartrate) .[name]..[name] Two times a day    Warfarin Sodium 5 Mg Tabs (Warfarin sodium) .[name]... 5,7 1/2    Digoxin 0.125 Mg Tabs (Digoxin) .[name]... Take 1 by mouth qd  Orders: Protime (29518AC) Fingerstick (16606)  Reviewed the following: PT: 18.8 (08/07/2007)   INR: 2.4 (08/07/2007) Next Protime: 4 weeks (dated on 07/04/2007)   Problem # 3:  DIABETES MELLITUS, TYPE II (ICD-250.00)  The following medications were removed from the medication list:    Metformin Hcl 500 Mg Tb24 (Metformin hcl) .[name]... Take 1 by mouth qd    Micardis 20 Mg Tabs (Telmisartan) .[name]... Take 1/2 tab by mouth qd  Her updated medication list for this problem includes:    Glimepiride 1 Mg Tabs (Glimepiride) ..... Once daily    Micardis Hct 40-12.5 Mg Tabs (Telmisartan-hctz) .[name]... 1/2 once daily    Metformin Hcl 500 Mg Tabs (Metformin hcl) ..... Once daily  Labs Reviewed: HgBA1c: 6.9 (05/08/2007)   Creat: 0.7 (05/08/2007)     Orders: TLB-A1C / Hgb A1C (Glycohemoglobin) (83036-A1C)   Problem # 4:  OSTEOARTHRITIS (ICD-715.90) bone density unreliab le due to arthritis will check vit d levelDiscussed use of  medications, application of heat or cold, and exercises.   Complete Medication List: 1)  Glimepiride 1 Mg Tabs (Glimepiride) .... Once daily 2)  Warfarin Sodium 5 Mg Tabs (Warfarin sodium) .... 5,10. 3)  Evista 60 Mg Tabs (Raloxifene hcl) .... Once daily 4)  Micardis Hct 40-12.5 Mg Tabs (Telmisartan-hctz) .... 1/2 once daily 5)  Cardizem Cd 240 Mg Cp24 (Diltiazem hcl coated beads) .... Once daily 6)  Klor-con 8 Meq Tbcr (Potassium chloride) .... Once daily 7)  Oscal 500/200 D-3 500-200 Mg-unit Tabs (Calcium-vitamin d) .[name].. 1650 total; 8)  Magnesium Oxide 400 Mg Caps (Magnesium oxide) .... 2 once daily 9)  Reclast 5 Mg/115ml Soln (Zoledronic acid) .... Yearly 10)  Metformin Hcl 500 Mg Tabs (Metformin hcl) .... Once daily 11)  Metoprolol Tartrate  50 Mg Tabs (Metoprolol tartrate) .... Two times a day 12)  Warfarin Sodium 5 Mg Tabs (Warfarin sodium) .... 5,7 1/2 13)  Digoxin 0.125 Mg Tabs (Digoxin) .... Take 1 by mouth qd  Other Orders: T-Vitamin D (25-Hydroxy) ([id number])  Diabetes Management Assessment/Plan:      The following lipid goals have been established for the patient: Total cholesterol goal of 200; LDL cholesterol goal of 100; HDL cholesterol goal of 40; Triglyceride goal of 200.     Patient Instructions: 1)  Please schedule a follow-up appointment in 3 months.    ]  ANTICOAGULATION RECORD PREVIOUS REGIMEN & LAB RESULTS Anticoagulation Diagnosis:  [id number],[id number] on  12/26/2006 Previous INR Goal Range:  2.0-3.0 on  12/26/2006 Previous INR:  2.4 on  07/04/2007 Previous Coumadin Dose(mg):  7.5,5 alt on  05/08/2007 Previous Regimen:  7.5-m,w,f/5mg  others on  05/08/2007  NEW REGIMEN & LAB RESULTS Current INR: 2.4 Regimen: 7.5-m,w,f/5mg  others  (no change)  MEDICATIONS GLIMEPIRIDE 1 MG TABS (GLIMEPIRIDE) once daily WARFARIN SODIUM 5 MG TABS (WARFARIN SODIUM) 5,10. EVISTA 60 MG  TABS (RALOXIFENE HCL) once daily MICARDIS HCT 40-12.5 MG  TABS (TELMISARTAN-HCTZ) 1/2  once daily CARDIZEM CD 240 MG  CP24 (DILTIAZEM HCL COATED BEADS) once daily KLOR-CON 8 MEQ  TBCR (POTASSIUM CHLORIDE) once daily OSCAL 500/200 D-3 500-200 MG-UNIT  TABS (CALCIUM-VITAMIN D) 1650 TOTAL; MAGNESIUM OXIDE 400 MG  CAPS (MAGNESIUM OXIDE) 2 once daily RECLAST 5 MG/100ML  SOLN (ZOLEDRONIC ACID) yearly METFORMIN HCL 500 MG  TABS (METFORMIN HCL) once daily METOPROLOL TARTRATE 50 MG TABS (METOPROLOL TARTRATE) two times a day WARFARIN SODIUM 5 MG TABS (WARFARIN SODIUM) 5,7 1/2 DIGOXIN 0.125 MG  TABS (DIGOXIN) take 1 by mouth qd   Anticoagulation Visit Questionnaire      Coumadin dose missed/changed:  No      Abnormal Bleeding Symptoms:  No   Any diet changes including alcohol intake, vegetables or greens since the last visit:  No Any illnesses or hospitalizations since the last visit:  No Any signs of clotting since the last visit (including chest discomfort, dizziness, shortness of breath, arm tingling, slurred speech, swelling or redness in leg):  No   Laboratory Results   Blood Tests    Date/Time Reported: August 07, 2007 10:12 AM   PT: 18.8 s   (Normal Range: 10.6-13.4)  INR: 2.4   (Normal Range: 0.88-1.12   Therap INR: 2.0-3.5) Comments: ..................................................................Marland KitchenWynona Canes, CMA  August 07, 2007 10:12 AM

## 2010-06-23 NOTE — Assessment & Plan Note (Signed)
Summary: pt/mhf   Nurse Visit   Vital Signs:  Patient profile:   77 year old female BP sitting:   124 / 68  Allergies: 1)  ! Codeine 2)  Codeine Sulfate (Codeine Sulfate) 3)  Codeine Sulfate (Codeine Sulfate) 4)  Codeine Sulfate (Codeine Sulfate) Laboratory Results   Blood Tests     PT: 17.3 s   (Normal Range: 10.6-13.4)  INR: 2.0   (Normal Range: 0.88-1.12   Therap INR: 2.0-3.5) Comments: Rita Ohara  January 06, 2009 11:06 AM       ANTICOAGULATION RECORD PREVIOUS REGIMEN & LAB RESULTS Anticoagulation Diagnosis:  v58.83,v58.61,427.31 on  05/31/2008 Previous INR Goal Range:  2.0-3.0 on  05/31/2008 Previous INR:  2.5 on  11/22/2008 Previous Coumadin Dose(mg):  7.5mg  on m,w,f 5mg  other days on  05/31/2008 Previous Regimen:  7.5mg . 7.5mg . 10mg . ALT on  10/28/2008 Previous Coagulation Comments:  Dr. Kirtland Bouchard approved on  02/02/2008  NEW REGIMEN & LAB RESULTS Current INR: 2.0 Regimen: 7.5mg . 7.5mg . 10mg . ALT  (no change)   Anticoagulation Visit Questionnaire Coumadin dose missed/changed:  No Abnormal Bleeding Symptoms:  No  Any diet changes including alcohol intake, vegetables or greens since the last visit:  No Any illnesses or hospitalizations since the last visit:  Yes Any signs of clotting since the last visit (including chest discomfort, dizziness, shortness of breath, arm tingling, slurred speech, swelling or redness in leg):  Yes  MEDICATIONS EVISTA 60 MG  TABS (RALOXIFENE HCL) once daily MICARDIS 40 MG TABS (TELMISARTAN) one by mouth daily CARDIZEM CD 240 MG  CP24 (DILTIAZEM HCL COATED BEADS) once daily KLOR-CON 8 MEQ  TBCR (POTASSIUM CHLORIDE) once daily CALTRATE 600+D 600-400 MG-UNIT TABS (CALCIUM CARBONATE-VITAMIN D) 1 two times a day MAGNESIUM OXIDE 400 MG  CAPS (MAGNESIUM OXIDE) 2 once daily RECLAST 5 MG/100ML  SOLN (ZOLEDRONIC ACID) yearly METFORMIN HCL 500 MG  TB24 (METFORMIN HCL) 2 two times a day METOPROLOL TARTRATE 50 MG TABS (METOPROLOL TARTRATE)  two times a day WARFARIN SODIUM 5 MG TABS (WARFARIN SODIUM) 7.5 for 3 days and 5 all others DIGOXIN 0.125 MG  TABS (DIGOXIN) take 1 by mouth qd ALPRAZOLAM 0.25 MG  TABS (ALPRAZOLAM) one by mouth q HS TRUETEST TEST   STRP (GLUCOSE BLOOD) two times a day * ONGLYZA 5 MG one by mouth daily ACTOS 45 MG TABS (PIOGLITAZONE HCL) 1 once daily CRESTOR 5 MG TABS (ROSUVASTATIN CALCIUM) 1 by mouth  MWF weekly MULTIVITAMINS  TABS (MULTIPLE VITAMIN) 1 once daily VITAMIN C CR 500 MG CR-CAPS (ASCORBIC ACID) 1 once daily      Vital Signs:  Patient Profile:   77 year old female Height:     64 inches BP sitting:   124 / 68  (right arm)

## 2010-06-23 NOTE — Progress Notes (Signed)
Summary: BS readings.  Phone Note Call from Patient   Caller: Patient Call For: Dr. Lovell Sheehan Summary of Call: Pt is calling to give Dr. Lovell Sheehan his BS readings. am: Friday : 130                            PM BS:  197                   She is on Amaryl 4 mg. one by mouth daily. Saturday:  107                                     227 Sun.       85                                           171 Mon.    112 Initial call taken by: Lynann [name] CMA,  January 22, 2008 10:44 AM  Follow-up for Phone Call        try taking the amary 1/2 by mouth two times a day  see if that smooths out the reading Follow-up by: Stacie Glaze MD,  January 22, 2008 12:25 PM  Additional Follow-up for Phone Call Additional follow up Details #1::        Pt given Dr. Lovell Sheehan recommendations. Additional Follow-up by: Lynann [name] CMA,  January 22, 2008 12:38 PM

## 2010-06-23 NOTE — Assessment & Plan Note (Signed)
Summary: PT/CJR   Nurse Visit   Vital Signs:  Patient profile:   77 year old female BP sitting:   132 / 78    Allergies: 1)  ! Codeine 2)  Codeine Sulfate (Codeine Sulfate) 3)  Codeine Sulfate (Codeine Sulfate) 4)  Codeine Sulfate (Codeine Sulfate)  Laboratory Results   Blood Tests     PT: 15.8 s   (Normal Range: 10.6-13.4)  INR: 1.6   (Normal Range: 0.88-1.12   Therap INR: 2.0-3.5) Comments: Rita Ohara  October 28, 2008 11:21 AM       Orders Added: 1)  Est. Patient Level I [99211] 2)  Fingerstick [36416] 3)  Protime [[id number]]      ANTICOAGULATION RECORD PREVIOUS REGIMEN & LAB RESULTS Anticoagulation Diagnosis:  v58.83,v58.61,427.31 on  05/31/2008 Previous INR Goal Range:  2.0-3.0 on  05/31/2008 Previous INR:  1.2 on  09/16/2008 Previous Coumadin Dose(mg):  7.5mg  on m,w,f 5mg  other days on  05/31/2008 Previous Regimen:  7.5mg  everyday. RTO 2 weeks. on  09/16/2008 Previous Coagulation Comments:  Dr. Kirtland Bouchard approved on  02/02/2008  NEW REGIMEN & LAB RESULTS Current INR: 1.6 Regimen: 7.5mg . 7.5mg . 10mg . ALT  Repeat testing in: 1 month  Anticoagulation Visit Questionnaire Coumadin dose missed/changed:  No Abnormal Bleeding Symptoms:  No  Any diet changes including alcohol intake, vegetables or greens since the last visit:  No Any illnesses or hospitalizations since the last visit:  No Any signs of clotting since the last visit (including chest discomfort, dizziness, shortness of breath, arm tingling, slurred speech, swelling or redness in leg):  No  MEDICATIONS EVISTA 60 MG  TABS (RALOXIFENE HCL) once daily MICARDIS 40 MG TABS (TELMISARTAN) one by mouth daily CARDIZEM CD 240 MG  CP24 (DILTIAZEM HCL COATED BEADS) once daily KLOR-CON 8 MEQ  TBCR (POTASSIUM CHLORIDE) once daily OSCAL 500/200 D-3 500-200 MG-UNIT  TABS (CALCIUM-VITAMIN D) 1650 TOTAL; MAGNESIUM OXIDE 400 MG  CAPS (MAGNESIUM OXIDE) 2 once daily RECLAST 5 MG/100ML  SOLN (ZOLEDRONIC ACID)  yearly METFORMIN HCL 500 MG  TB24 (METFORMIN HCL) 2 two times a day METOPROLOL TARTRATE 50 MG TABS (METOPROLOL TARTRATE) two times a day WARFARIN SODIUM 5 MG TABS (WARFARIN SODIUM) 7.5 for 3 days and 5 all others DIGOXIN 0.125 MG  TABS (DIGOXIN) take 1 by mouth qd ALPRAZOLAM 0.25 MG  TABS (ALPRAZOLAM) one by mouth q HS TRUETEST TEST   STRP (GLUCOSE BLOOD) two times a day * ONGLYZA 5 MG one by mouth daily ACTOS 45 MG TABS (PIOGLITAZONE HCL) 1 once daily CRESTOR 5 MG TABS (ROSUVASTATIN CALCIUM) 1 by mouth  MWF weekly MULTIVITAMINS  TABS (MULTIPLE VITAMIN) 1 once daily VITAMIN C CR 500 MG CR-CAPS (ASCORBIC ACID) 1 once daily     Vital Signs:  Patient Profile:   77 year old female Height:     64 inches BP sitting:   132 / 78  (left arm)

## 2010-06-23 NOTE — Assessment & Plan Note (Signed)
Summary: pt/mm pt rsc/njr   Nurse Visit   Allergies: 1)  ! Codeine 2)  Codeine Sulfate (Codeine Sulfate) 3)  Codeine Sulfate (Codeine Sulfate) 4)  Codeine Sulfate (Codeine Sulfate) Laboratory Results   Blood Tests   Date/Time Received: July 18, 2009 2:31 PM  Date/Time Reported: July 18, 2009 2:31 PM   PT: 11.9 s   (Normal Range: 10.6-13.4)  INR: .9   (Normal Range: 0.88-1.12   Therap INR: 2.0-3.5) Comments: Wynona Canes, CMA  July 18, 2009 2:31 PM     Orders Added: 1)  Est. Patient Level I [99211] 2)  Protime [[id number]]  Laboratory Results   Blood Tests     PT: 11.9 s   (Normal Range: 10.6-13.4)  INR: .9   (Normal Range: 0.88-1.12   Therap INR: 2.0-3.5) Comments: Wynona Canes, CMA  July 18, 2009 2:31 PM       ANTICOAGULATION RECORD PREVIOUS REGIMEN & LAB RESULTS Anticoagulation Diagnosis:  v58.83,v58.61,427.31 on  05/31/2008 Previous INR Goal Range:  2.0-3.0 on  05/31/2008 Previous INR:  3.2 on  06/18/2009 Previous Coumadin Dose(mg):  7.5mg  on m,w,f 5mg  other days on  05/31/2008 Previous Regimen:  Pt was taking previous regimen of 7.5mg  , 10mg  alternating , informed and wrote directions to take 7.5mg , 7.5mg , 10mg  alternating then RTO 3 wks. on  05/28/2009 Previous Coagulation Comments:  Dr. Kirtland Bouchard approved. on  01/31/2009  NEW REGIMEN & LAB RESULTS Current INR: .9 Current Coumadin Dose(mg): 7.5mg  qd Regimen: 10mg  ,7.5mg  alt.       Repeat testing in: 3 weeks MEDICATIONS EVISTA 60 MG  TABS (RALOXIFENE HCL) once daily MICARDIS 40 MG TABS (TELMISARTAN) one by mouth daily CARDIZEM CD 240 MG  CP24 (DILTIAZEM HCL COATED BEADS) once daily KLOR-CON 8 MEQ  TBCR (POTASSIUM CHLORIDE) once daily CALTRATE 600+D 600-400 MG-UNIT TABS (CALCIUM CARBONATE-VITAMIN D) 1 two times a day MAGNESIUM 250 MG TABS (MAGNESIUM) three times a day RECLAST 5 MG/100ML  SOLN (ZOLEDRONIC ACID) yearly METFORMIN HCL 500 MG  TB24 (METFORMIN HCL) 2 tabs two times a  day WARFARIN SODIUM 5 MG TABS (WARFARIN SODIUM) 7.5, daily DIGOXIN 0.125 MG  TABS (DIGOXIN) take 1 by mouth qd ALPRAZOLAM 0.25 MG  TABS (ALPRAZOLAM) one by mouth q HS TRUETEST TEST   STRP (GLUCOSE BLOOD) two times a day * ONGLYZA 5 MG one by mouth daily ACTOS 45 MG TABS (PIOGLITAZONE HCL) 1 once daily CRESTOR 5 MG TABS (ROSUVASTATIN CALCIUM) 1 by mouth  MWF weekly MULTIVITAMINS  TABS (MULTIPLE VITAMIN) 1 once daily VITAMIN C CR 500 MG CR-CAPS (ASCORBIC ACID) 1 once daily VITAMIN D 2000 UNIT TABS (CHOLECALCIFEROL) 1 once daily B COMPLEX-B12  TABS (B COMPLEX VITAMINS) 1 once daily   Anticoagulation Visit Questionnaire      Coumadin dose missed/changed:  No      Abnormal Bleeding Symptoms:  No   Any diet changes including alcohol intake, vegetables or greens since the last visit:  No Any illnesses or hospitalizations since the last visit:  No Any signs of clotting since the last visit (including chest discomfort, dizziness, shortness of breath, arm tingling, slurred speech, swelling or redness in leg):  No

## 2010-06-23 NOTE — Assessment & Plan Note (Signed)
Summary: pt/njr   Nurse Visit   Allergies: 1)  ! Codeine 2)  Codeine Sulfate (Codeine Sulfate) 3)  Codeine Sulfate (Codeine Sulfate) 4)  Codeine Sulfate (Codeine Sulfate) Laboratory Results   Blood Tests      INR: 2.6   (Normal Range: 0.88-1.12   Therap INR: 2.0-3.5) Comments: Rita Ohara  April 20, 2010 2:27 PM     Orders Added: 1)  Est. Patient Level I [99211] 2)  Protime [[id number]]   ANTICOAGULATION RECORD PREVIOUS REGIMEN & LAB RESULTS Anticoagulation Diagnosis:  v58.83,v58.61,427.31 on  05/31/2008 Previous INR Goal Range:  2.0-3.0 on  05/31/2008 Previous INR:  2.1 on  03/23/2010 Previous Coumadin Dose(mg):  7.5mg ,10mg  alt. on  08/08/2009 Previous Regimen:  same on  03/23/2010 Previous Coagulation Comments:  Dr. Kirtland Bouchard approved. on  01/31/2009  NEW REGIMEN & LAB RESULTS Current INR: 2.6 Regimen: same  Repeat testing in: 4 weeks  Anticoagulation Visit Questionnaire Coumadin dose missed/changed:  No Abnormal Bleeding Symptoms:  No  Any diet changes including alcohol intake, vegetables or greens since the last visit:  No Any illnesses or hospitalizations since the last visit:  No Any signs of clotting since the last visit (including chest discomfort, dizziness, shortness of breath, arm tingling, slurred speech, swelling or redness in leg):  No  MEDICATIONS EVISTA 60 MG  TABS (RALOXIFENE HCL) once daily LOSARTAN POTASSIUM-HCTZ 100-12.5 MG TABS (LOSARTAN POTASSIUM-HCTZ) one by mouth daily CARDIZEM CD 240 MG  CP24 (DILTIAZEM HCL COATED BEADS) once daily KLOR-CON 8 MEQ  TBCR (POTASSIUM CHLORIDE) once daily CALTRATE 600+D 600-400 MG-UNIT TABS (CALCIUM CARBONATE-VITAMIN D) 1 two times a day MAGNESIUM 250 MG TABS (MAGNESIUM) three times a day RECLAST 5 MG/100ML  SOLN (ZOLEDRONIC ACID) yearly WARFARIN SODIUM 5 MG TABS (WARFARIN SODIUM) 7.5, daily DIGOXIN 0.125 MG  TABS (DIGOXIN) take 1 by mouth qd ALPRAZOLAM 0.25 MG  TABS (ALPRAZOLAM) one by mouth q HS TRUETEST  TEST   STRP (GLUCOSE BLOOD) two times a day KOMBIGLYZE XR 2.09-998 MG XR24H-TAB (SAXAGLIPTIN-METFORMIN) one by mouth two times a day ACTOS 45 MG TABS (PIOGLITAZONE HCL) 1 once daily CRESTOR 5 MG TABS (ROSUVASTATIN CALCIUM) 1 by mouth  MWF weekly MULTIVITAMINS  TABS (MULTIPLE VITAMIN) 1 once daily VITAMIN C CR 500 MG CR-CAPS (ASCORBIC ACID) 1 once daily VITAMIN D 2000 UNIT TABS (CHOLECALCIFEROL) 1 once daily B COMPLEX-B12  TABS (B COMPLEX VITAMINS) 1 once daily METOPROLOL TARTRATE 50 MG TABS (METOPROLOL TARTRATE) 1 two times a day

## 2010-06-23 NOTE — Assessment & Plan Note (Signed)
Summary: pt/njr/PT RESCD//CCM   Nurse Visit   Allergies: 1)  ! Codeine 2)  Codeine Sulfate (Codeine Sulfate) 3)  Codeine Sulfate (Codeine Sulfate) 4)  Codeine Sulfate (Codeine Sulfate) Laboratory Results   Blood Tests     PT: 20.1 s   (Normal Range: 10.6-13.4)  INR: 2.8   (Normal Range: 0.88-1.12   Therap INR: 2.0-3.5) Comments: [name] CMA  February 25, 2009 11:41 AM     Orders Added: 1)  Est. Patient Level I [99211] 2)  Protime [[id number]]   ANTICOAGULATION RECORD PREVIOUS REGIMEN & LAB RESULTS Anticoagulation Diagnosis:  v58.83,v58.61,427.31 on  05/31/2008 Previous INR Goal Range:  2.0-3.0 on  05/31/2008 Previous INR:  3.3 on  01/31/2009 Previous Coumadin Dose(mg):  7.5mg  on m,w,f 5mg  other days on  05/31/2008 Previous Regimen:  same on  01/31/2009 Previous Coagulation Comments:  Dr. Kirtland Bouchard approved. on  01/31/2009  NEW REGIMEN & LAB RESULTS Current INR: 2.8 Regimen: same  (no change)   Anticoagulation Visit Questionnaire Coumadin dose missed/changed:  No Abnormal Bleeding Symptoms:  No  Any diet changes including alcohol intake, vegetables or greens since the last visit:  No Any illnesses or hospitalizations since the last visit:  No Any signs of clotting since the last visit (including chest discomfort, dizziness, shortness of breath, arm tingling, slurred speech, swelling or redness in leg):  No  MEDICATIONS EVISTA 60 MG  TABS (RALOXIFENE HCL) once daily MICARDIS 40 MG TABS (TELMISARTAN) one by mouth daily CARDIZEM CD 240 MG  CP24 (DILTIAZEM HCL COATED BEADS) once daily KLOR-CON 8 MEQ  TBCR (POTASSIUM CHLORIDE) once daily CALTRATE 600+D 600-400 MG-UNIT TABS (CALCIUM CARBONATE-VITAMIN D) 1 two times a day MAGNESIUM OXIDE 400 MG  CAPS (MAGNESIUM OXIDE) 2 once daily RECLAST 5 MG/100ML  SOLN (ZOLEDRONIC ACID) yearly METFORMIN HCL 500 MG  TB24 (METFORMIN HCL) 2 two times a day METOPROLOL TARTRATE 50 MG TABS (METOPROLOL TARTRATE) two times a day WARFARIN  SODIUM 5 MG TABS (WARFARIN SODIUM) 7.5 for 3 days and 5 all others DIGOXIN 0.125 MG  TABS (DIGOXIN) take 1 by mouth qd ALPRAZOLAM 0.25 MG  TABS (ALPRAZOLAM) one by mouth q HS TRUETEST TEST   STRP (GLUCOSE BLOOD) two times a day * ONGLYZA 5 MG one by mouth daily ACTOS 45 MG TABS (PIOGLITAZONE HCL) 1 once daily CRESTOR 5 MG TABS (ROSUVASTATIN CALCIUM) 1 by mouth  MWF weekly MULTIVITAMINS  TABS (MULTIPLE VITAMIN) 1 once daily VITAMIN C CR 500 MG CR-CAPS (ASCORBIC ACID) 1 once daily

## 2010-06-23 NOTE — Assessment & Plan Note (Signed)
Summary: PT//SLM   Nurse Visit   Allergies: 1)  ! Codeine 2)  Codeine Sulfate (Codeine Sulfate) 3)  Codeine Sulfate (Codeine Sulfate) 4)  Codeine Sulfate (Codeine Sulfate) Laboratory Results   Blood Tests     PT: 13.9 s   (Normal Range: 10.6-13.4)  INR: 1.3   (Normal Range: 0.88-1.12   Therap INR: 2.0-3.5) Comments: [name] CMA  April 23, 2009 2:05 PM     Orders Added: 1)  Est. Patient Level I [99211] 2)  Protime [[id number]]   ANTICOAGULATION RECORD PREVIOUS REGIMEN & LAB RESULTS Anticoagulation Diagnosis:  v58.83,v58.61,427.31 on  05/31/2008 Previous INR Goal Range:  2.0-3.0 on  05/31/2008 Previous INR:  2.0 on  03/26/2009 Previous Coumadin Dose(mg):  7.5mg  on m,w,f 5mg  other days on  05/31/2008 Previous Regimen:  same on  03/26/2009 Previous Coagulation Comments:  Dr. Kirtland Bouchard approved. on  01/31/2009  NEW REGIMEN & LAB RESULTS Current INR: 1.3 Regimen: Take 7.5mg , 10mg , 7.5mg  alternating. RTO 2 wks.   Anticoagulation Visit Questionnaire Coumadin dose missed/changed:  No Abnormal Bleeding Symptoms:  No  Any diet changes including alcohol intake, vegetables or greens since the last visit:  No Any illnesses or hospitalizations since the last visit:  No Any signs of clotting since the last visit (including chest discomfort, dizziness, shortness of breath, arm tingling, slurred speech, swelling or redness in leg):  No  MEDICATIONS EVISTA 60 MG  TABS (RALOXIFENE HCL) once daily MICARDIS 40 MG TABS (TELMISARTAN) one by mouth daily CARDIZEM CD 240 MG  CP24 (DILTIAZEM HCL COATED BEADS) once daily KLOR-CON 8 MEQ  TBCR (POTASSIUM CHLORIDE) once daily CALTRATE 600+D 600-400 MG-UNIT TABS (CALCIUM CARBONATE-VITAMIN D) 1 two times a day MAGNESIUM 250 MG TABS (MAGNESIUM) three times a day RECLAST 5 MG/100ML  SOLN (ZOLEDRONIC ACID) yearly METFORMIN HCL 500 MG  TB24 (METFORMIN HCL) 2 tabs two times a day WARFARIN SODIUM 5 MG TABS (WARFARIN SODIUM) 7.5 for 3 days and 5  all others DIGOXIN 0.125 MG  TABS (DIGOXIN) take 1 by mouth qd ALPRAZOLAM 0.25 MG  TABS (ALPRAZOLAM) one by mouth q HS TRUETEST TEST   STRP (GLUCOSE BLOOD) two times a day * ONGLYZA 5 MG one by mouth daily ACTOS 45 MG TABS (PIOGLITAZONE HCL) 1 once daily CRESTOR 5 MG TABS (ROSUVASTATIN CALCIUM) 1 by mouth  MWF weekly MULTIVITAMINS  TABS (MULTIPLE VITAMIN) 1 once daily VITAMIN C CR 500 MG CR-CAPS (ASCORBIC ACID) 1 once daily VITAMIN D 2000 UNIT TABS (CHOLECALCIFEROL) 1 once daily B COMPLEX-B12  TABS (B COMPLEX VITAMINS) 1 once daily

## 2010-06-23 NOTE — Assessment & Plan Note (Signed)
Summary: pt labs//ccm/Resched by pt/cb   Nurse Visit   Allergies: 1)  ! Codeine 2)  Codeine Sulfate (Codeine Sulfate) 3)  Codeine Sulfate (Codeine Sulfate) 4)  Codeine Sulfate (Codeine Sulfate) Laboratory Results   Blood Tests   Date/Time Received: February 27, 2010 2:50 PM  Date/Time Reported: February 27, 2010 2:50 PM    INR: 3.1   (Normal Range: 0.88-1.12   Therap INR: 2.0-3.5) Comments: Wynona Canes, CMA  February 27, 2010 2:50 PM     Orders Added: 1)  Est. Patient Level I [99211] 2)  Protime [[id number]] 3)  Est. Patient Level I [47425] 4)  Protime [[id number]]  Laboratory Results   Blood Tests      INR: 3.1   (Normal Range: 0.88-1.12   Therap INR: 2.0-3.5) Comments: Wynona Canes, CMA  February 27, 2010 2:50 PM       ANTICOAGULATION RECORD PREVIOUS REGIMEN & LAB RESULTS Anticoagulation Diagnosis:  v58.83,v58.61,427.31 on  05/31/2008 Previous INR Goal Range:  2.0-3.0 on  05/31/2008 Previous INR:  1.9 on  01/29/2010 Previous Coumadin Dose(mg):  7.5mg ,10mg  alt. on  08/08/2009 Previous Regimen:  same on  12/29/2009 Previous Coagulation Comments:  Dr. Kirtland Bouchard approved. on  01/31/2009  NEW REGIMEN & LAB RESULTS Current INR: 3.1 Regimen: same  (no change)       Repeat testing in: 4 weeks MEDICATIONS EVISTA 60 MG  TABS (RALOXIFENE HCL) once daily LOSARTAN POTASSIUM 100 MG TABS (LOSARTAN POTASSIUM) one by mouth daily CARDIZEM CD 240 MG  CP24 (DILTIAZEM HCL COATED BEADS) once daily KLOR-CON 8 MEQ  TBCR (POTASSIUM CHLORIDE) once daily CALTRATE 600+D 600-400 MG-UNIT TABS (CALCIUM CARBONATE-VITAMIN D) 1 two times a day MAGNESIUM 250 MG TABS (MAGNESIUM) three times a day RECLAST 5 MG/100ML  SOLN (ZOLEDRONIC ACID) yearly WARFARIN SODIUM 5 MG TABS (WARFARIN SODIUM) 7.5, daily DIGOXIN 0.125 MG  TABS (DIGOXIN) take 1 by mouth qd ALPRAZOLAM 0.25 MG  TABS (ALPRAZOLAM) one by mouth q HS TRUETEST TEST   STRP (GLUCOSE BLOOD) two times a day KOMBIGLYZE XR 2.09-998 MG  XR24H-TAB (SAXAGLIPTIN-METFORMIN) one by mouth two times a day ACTOS 45 MG TABS (PIOGLITAZONE HCL) 1 once daily CRESTOR 5 MG TABS (ROSUVASTATIN CALCIUM) 1 by mouth  MWF weekly MULTIVITAMINS  TABS (MULTIPLE VITAMIN) 1 once daily VITAMIN C CR 500 MG CR-CAPS (ASCORBIC ACID) 1 once daily VITAMIN D 2000 UNIT TABS (CHOLECALCIFEROL) 1 once daily B COMPLEX-B12  TABS (B COMPLEX VITAMINS) 1 once daily METOPROLOL TARTRATE 50 MG TABS (METOPROLOL TARTRATE) 1 two times a day   Anticoagulation Visit Questionnaire      Coumadin dose missed/changed:  No      Abnormal Bleeding Symptoms:  No   Any diet changes including alcohol intake, vegetables or greens since the last visit:  No Any illnesses or hospitalizations since the last visit:  No Any signs of clotting since the last visit (including chest discomfort, dizziness, shortness of breath, arm tingling, slurred speech, swelling or redness in leg):  No

## 2010-06-23 NOTE — Assessment & Plan Note (Signed)
Summary: pt/njr   Nurse Visit   Allergies: 1)  ! Codeine 2)  Codeine Sulfate (Codeine Sulfate) 3)  Codeine Sulfate (Codeine Sulfate) 4)  Codeine Sulfate (Codeine Sulfate) Laboratory Results   Blood Tests      INR: 2.9   (Normal Range: 0.88-1.12   Therap INR: 2.0-3.5) Comments: Rita Ohara  December 01, 2009 2:34 PM     Orders Added: 1)  Est. Patient Level I [99211] 2)  Protime [[id number]]   ANTICOAGULATION RECORD PREVIOUS REGIMEN & LAB RESULTS Anticoagulation Diagnosis:  v58.83,v58.61,427.31 on  05/31/2008 Previous INR Goal Range:  2.0-3.0 on  05/31/2008 Previous INR:  1.6 on  10/28/2009 Previous Coumadin Dose(mg):  7.5mg ,10mg  alt. on  08/08/2009 Previous Regimen:  same on  10/01/2009 Previous Coagulation Comments:  Dr. Kirtland Bouchard approved. on  01/31/2009  NEW REGIMEN & LAB RESULTS Current INR: 2.9 Regimen: same  Repeat testing in: 4 weeks  Anticoagulation Visit Questionnaire Coumadin dose missed/changed:  No Abnormal Bleeding Symptoms:  No  Any diet changes including alcohol intake, vegetables or greens since the last visit:  No Any illnesses or hospitalizations since the last visit:  No Any signs of clotting since the last visit (including chest discomfort, dizziness, shortness of breath, arm tingling, slurred speech, swelling or redness in leg):  No  MEDICATIONS EVISTA 60 MG  TABS (RALOXIFENE HCL) once daily MICARDIS 40 MG TABS (TELMISARTAN) one by mouth daily CARDIZEM CD 240 MG  CP24 (DILTIAZEM HCL COATED BEADS) once daily KLOR-CON 8 MEQ  TBCR (POTASSIUM CHLORIDE) once daily CALTRATE 600+D 600-400 MG-UNIT TABS (CALCIUM CARBONATE-VITAMIN D) 1 two times a day MAGNESIUM 250 MG TABS (MAGNESIUM) three times a day RECLAST 5 MG/100ML  SOLN (ZOLEDRONIC ACID) yearly METFORMIN HCL 500 MG  TB24 (METFORMIN HCL) 2 tabs two times a day WARFARIN SODIUM 5 MG TABS (WARFARIN SODIUM) 7.5, daily DIGOXIN 0.125 MG  TABS (DIGOXIN) take 1 by mouth qd ALPRAZOLAM 0.25 MG  TABS  (ALPRAZOLAM) one by mouth q HS TRUETEST TEST   STRP (GLUCOSE BLOOD) two times a day * ONGLYZA 5 MG one by mouth daily ACTOS 45 MG TABS (PIOGLITAZONE HCL) 1 once daily CRESTOR 5 MG TABS (ROSUVASTATIN CALCIUM) 1 by mouth  MWF weekly MULTIVITAMINS  TABS (MULTIPLE VITAMIN) 1 once daily VITAMIN C CR 500 MG CR-CAPS (ASCORBIC ACID) 1 once daily VITAMIN D 2000 UNIT TABS (CHOLECALCIFEROL) 1 once daily B COMPLEX-B12  TABS (B COMPLEX VITAMINS) 1 once daily METOPROLOL TARTRATE 50 MG TABS (METOPROLOL TARTRATE) 1 two times a day
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
## 2010-06-23 NOTE — Assessment & Plan Note (Signed)
Summary: pt/njrpt rescd//ccm   Nurse Visit   Allergies: 1)  ! Codeine 2)  Codeine Sulfate (Codeine Sulfate) 3)  Codeine Sulfate (Codeine Sulfate) 4)  Codeine Sulfate (Codeine Sulfate) Laboratory Results   Blood Tests   Date/Time Received: September 03, 2009 2:14 PM Date/Time Reported: September 03, 2009 2:14 PM  PT: 19.2 s   (Normal Range: 10.6-13.4)  INR: 2.5   (Normal Range: 0.88-1.12   Therap INR: 2.0-3.5) Comments: Rita Ohara  September 03, 2009 2:14 PM    Orders Added: 1)  Protime [[id number]] 2)  Fingerstick [[id number]]    ANTICOAGULATION RECORD PREVIOUS REGIMEN & LAB RESULTS Anticoagulation Diagnosis:  v58.83,v58.61,427.31 on  05/31/2008 Previous INR Goal Range:  2.0-3.0 on  05/31/2008 Previous INR:  2.8 on  08/08/2009 Previous Coumadin Dose(mg):  7.5mg ,10mg  alt. on  08/08/2009 Previous Regimen:  7.5mg ,10mg  alt. on  08/08/2009 Previous Coagulation Comments:  Dr. Kirtland Bouchard approved. on  01/31/2009  NEW REGIMEN & LAB RESULTS Current INR: 2.5 Regimen: 7.5mg ,10mg  alt.  (no change)  Repeat testing in: 4 wks  Anticoagulation Visit Questionnaire Coumadin dose missed/changed:  No Abnormal Bleeding Symptoms:  No  Any diet changes including alcohol intake, vegetables or greens since the last visit:  No Any illnesses or hospitalizations since the last visit:  No Any signs of clotting since the last visit (including chest discomfort, dizziness, shortness of breath, arm tingling, slurred speech, swelling or redness in leg):  No  MEDICATIONS EVISTA 60 MG  TABS (RALOXIFENE HCL) once daily MICARDIS 40 MG TABS (TELMISARTAN) one by mouth daily CARDIZEM CD 240 MG  CP24 (DILTIAZEM HCL COATED BEADS) once daily KLOR-CON 8 MEQ  TBCR (POTASSIUM CHLORIDE) once daily CALTRATE 600+D 600-400 MG-UNIT TABS (CALCIUM CARBONATE-VITAMIN D) 1 two times a day MAGNESIUM 250 MG TABS (MAGNESIUM) three times a day RECLAST 5 MG/100ML  SOLN (ZOLEDRONIC ACID) yearly METFORMIN HCL 500 MG  TB24 (METFORMIN HCL) 2  tabs two times a day WARFARIN SODIUM 5 MG TABS (WARFARIN SODIUM) 7.5, daily DIGOXIN 0.125 MG  TABS (DIGOXIN) take 1 by mouth qd ALPRAZOLAM 0.25 MG  TABS (ALPRAZOLAM) one by mouth q HS TRUETEST TEST   STRP (GLUCOSE BLOOD) two times a day * ONGLYZA 5 MG one by mouth daily ACTOS 45 MG TABS (PIOGLITAZONE HCL) 1 once daily CRESTOR 5 MG TABS (ROSUVASTATIN CALCIUM) 1 by mouth  MWF weekly MULTIVITAMINS  TABS (MULTIPLE VITAMIN) 1 once daily VITAMIN C CR 500 MG CR-CAPS (ASCORBIC ACID) 1 once daily VITAMIN D 2000 UNIT TABS (CHOLECALCIFEROL) 1 once daily B COMPLEX-B12  TABS (B COMPLEX VITAMINS) 1 once daily

## 2010-06-24 ENCOUNTER — Other Ambulatory Visit: Payer: Self-pay | Admitting: Internal Medicine

## 2010-06-24 DIAGNOSIS — M419 Scoliosis, unspecified: Secondary | ICD-10-CM

## 2010-06-25 NOTE — Assessment & Plan Note (Signed)
Summary: PT/RCD   Nurse Visit   Allergies: 1)  ! Codeine 2)  Codeine Sulfate (Codeine Sulfate) 3)  Codeine Sulfate (Codeine Sulfate) 4)  Codeine Sulfate (Codeine Sulfate) Laboratory Results   Blood Tests      INR: 2.5   (Normal Range: 0.88-1.12   Therap INR: 2.0-3.5) Comments: Rita Ohara  May 27, 2010 3:45 PM     Orders Added: 1)  Est. Patient Level I [16109] 2)  Protime [[id number]]   ANTICOAGULATION RECORD PREVIOUS REGIMEN & LAB RESULTS Anticoagulation Diagnosis:  v58.83,v58.61,427.31 on  05/31/2008 Previous INR Goal Range:  2.0-3.0 on  05/31/2008 Previous INR:  2.6 on  04/20/2010 Previous Coumadin Dose(mg):  7.5mg ,10mg  alt. on  08/08/2009 Previous Regimen:  same on  04/20/2010 Previous Coagulation Comments:  Dr. Kirtland Bouchard approved. on  01/31/2009  NEW REGIMEN & LAB RESULTS Current INR: 2.5 Regimen: same  Repeat testing in: 4 weeks  Anticoagulation Visit Questionnaire Coumadin dose missed/changed:  No Abnormal Bleeding Symptoms:  No  Any diet changes including alcohol intake, vegetables or greens since the last visit:  No Any illnesses or hospitalizations since the last visit:  No Any signs of clotting since the last visit (including chest discomfort, dizziness, shortness of breath, arm tingling, slurred speech, swelling or redness in leg):  No  MEDICATIONS EVISTA 60 MG  TABS (RALOXIFENE HCL) once daily LOSARTAN POTASSIUM-HCTZ 100-12.5 MG TABS (LOSARTAN POTASSIUM-HCTZ) one by mouth daily CARDIZEM CD 240 MG  CP24 (DILTIAZEM HCL COATED BEADS) once daily KLOR-CON 8 MEQ  TBCR (POTASSIUM CHLORIDE) once daily CALTRATE 600+D 600-400 MG-UNIT TABS (CALCIUM CARBONATE-VITAMIN D) 1 two times a day MAGNESIUM 250 MG TABS (MAGNESIUM) three times a day RECLAST 5 MG/100ML  SOLN (ZOLEDRONIC ACID) yearly WARFARIN SODIUM 5 MG TABS (WARFARIN SODIUM) 7.5, daily DIGOXIN 0.125 MG  TABS (DIGOXIN) take 1 by mouth qd ALPRAZOLAM 0.25 MG  TABS (ALPRAZOLAM) one by mouth q HS TRUETEST  TEST   STRP (GLUCOSE BLOOD) two times a day KOMBIGLYZE XR 2.09-998 MG XR24H-TAB (SAXAGLIPTIN-METFORMIN) one by mouth two times a day ACTOS 45 MG TABS (PIOGLITAZONE HCL) 1 once daily CRESTOR 5 MG TABS (ROSUVASTATIN CALCIUM) 1 by mouth  MWF weekly MULTIVITAMINS  TABS (MULTIPLE VITAMIN) 1 once daily VITAMIN C CR 500 MG CR-CAPS (ASCORBIC ACID) 1 once daily VITAMIN D 2000 UNIT TABS (CHOLECALCIFEROL) 1 once daily B COMPLEX-B12  TABS (B COMPLEX VITAMINS) 1 once daily METOPROLOL TARTRATE 50 MG TABS (METOPROLOL TARTRATE) 1 two times a day

## 2010-06-25 NOTE — Miscellaneous (Signed)
Summary: Health Care Power of Attorney & Living Will  Health Care Power of Attorney & Living Will   Imported By: Maryln Gottron 05/21/2010 09:29:48  _____________________________________________________________________  External Attachment:    Type:   Image     Comment:   External Document

## 2010-06-26 ENCOUNTER — Ambulatory Visit: Admit: 2010-06-26 | Payer: Self-pay | Admitting: Internal Medicine

## 2010-06-26 NOTE — Letter (Signed)
Summary: [facility] Office Progress Note   [facility] Office Progress Note   Imported By: Roderic Ovens 12/10/2009 14:09:37  _____________________________________________________________________  External Attachment:    Type:   Image     Comment:   External Document

## 2010-06-26 NOTE — Letter (Signed)
Summary: [facility]  [facility]   Imported By: Marylou Mccoy 01/16/2010 14:44:56  _____________________________________________________________________  External Attachment:    Type:   Image     Comment:   External Document

## 2010-06-26 NOTE — Letter (Signed)
Summary: [facility] Progress Note  [facility] Progress [facility]   Imported By: Roderic Ovens 10/09/2009 11:15:35  _____________________________________________________________________  External Attachment:    Type:   Image     Comment:   External Document

## 2010-06-26 NOTE — Letter (Signed)
Summary: [facility]  [facility]   Imported By: Maryln Gottron 06/19/2009 12:28:22  _____________________________________________________________________  External Attachment:    Type:   Image     Comment:   External Document

## 2010-06-26 NOTE — Letter (Signed)
Summary: [facility] Endocrinology Progress Note  [facility] Progress Note   Imported By: Roderic Ovens 04/28/2009 14:45:16  _____________________________________________________________________  External Attachment:    Type:   Image     Comment:   External Document

## 2010-06-26 NOTE — Letter (Signed)
Summary: [facility]  [facility]   Imported By: Lanelle Bal 10/24/2007 13:40:34  _____________________________________________________________________  External Attachment:    Type:   Image     Comment:   External Document

## 2010-06-26 NOTE — Letter (Signed)
[facility]   Imported By: Sherian Rein 11/01/2009 08:39:51  _____________________________________________________________________  External Attachment:    Type:   Image     Comment:   External Document

## 2010-06-26 NOTE — Progress Notes (Signed)
Summary: Phone Note  Phone Note   Imported By: Kassie Mends 07/27/2007 09:35:44  _____________________________________________________________________  External Attachment:    Type:   Image     Comment:   phone note

## 2010-06-30 ENCOUNTER — Ambulatory Visit: Payer: Medicare Other | Attending: Internal Medicine

## 2010-06-30 ENCOUNTER — Other Ambulatory Visit (INDEPENDENT_AMBULATORY_CARE_PROVIDER_SITE_OTHER): Payer: Medicare Other | Admitting: Internal Medicine

## 2010-06-30 DIAGNOSIS — M545 Low back pain, unspecified: Secondary | ICD-10-CM | POA: Insufficient documentation

## 2010-06-30 DIAGNOSIS — IMO0001 Reserved for inherently not codable concepts without codable children: Secondary | ICD-10-CM | POA: Insufficient documentation

## 2010-06-30 DIAGNOSIS — M6281 Muscle weakness (generalized): Secondary | ICD-10-CM | POA: Insufficient documentation

## 2010-06-30 DIAGNOSIS — M546 Pain in thoracic spine: Secondary | ICD-10-CM | POA: Insufficient documentation

## 2010-06-30 DIAGNOSIS — I4891 Unspecified atrial fibrillation: Secondary | ICD-10-CM

## 2010-06-30 LAB — POCT INR: INR: 2.2

## 2010-07-02 ENCOUNTER — Ambulatory Visit: Payer: Medicare Other | Admitting: Physical Therapy

## 2010-07-07 ENCOUNTER — Ambulatory Visit: Payer: Medicare Other | Admitting: Physical Therapy

## 2010-07-08 NOTE — Patient Instructions (Signed)
°  Latest dosing instructions   Total Glynis Smiles Tue Wed Thu Fri Sat   85 10 mg 15 mg 10 mg 15 mg 10 mg 15 mg 10 mg    (10 mg1) (10 mg1.5) (10 mg1) (10 mg1.5) (10 mg1) (10 mg1.5) (10 mg1)

## 2010-07-09 ENCOUNTER — Ambulatory Visit: Payer: Medicare Other | Admitting: Physical Therapy

## 2010-07-09 NOTE — Assessment & Plan Note (Signed)
Summary: 3 month rov/njr   Vital Signs:  Patient profile:   77 year old female Height:      64 inches Weight:      162 pounds BMI:     27.91 Temp:     98.2 degrees F oral Pulse rate:   64 / minute Resp:     14 per minute BP sitting:   124 / 70  (left arm)  Vitals Entered By: Willy Eddy, LPN (June 15, 2010 11:02 AM) CC: roa, Hypertension Management Is Patient Diabetic? Yes Did you bring your meter with you today? No   Primary Care Provider:  Stacie Glaze MD  CC:  roa and Hypertension Management.  History of Present Illness: The pt has  changed to [facility] and the pt has expensive medications and will be headed for the donut hole. The pt has not been monitering her CBGs as she should but goes to the endocrinologist... he will draw the screening blood work. Main complaint is insomnia she increased the alprazolam to 1.5 pills with good result.  Hypertension History:      She denies headache, chest pain, palpitations, dyspnea with exertion, orthopnea, PND, peripheral edema, visual symptoms, neurologic problems, syncope, and side effects from treatment.        Positive major cardiovascular risk factors include female age 57 years old or older, diabetes, hyperlipidemia, and hypertension.  Negative major cardiovascular risk factors include negative family history for ischemic heart disease and non-tobacco-user status.     Preventive Screening-Counseling & Management  Alcohol-Tobacco     Smoking Status: never     Passive Smoke Exposure: no  Problems Prior to Update: 1)  Scoliosis, Lumbar Spine  (ICD-737.30) 2)  Anxiety State, Unspecified  (ICD-300.00) 3)  Hysterectomy  (ICD-V88.01) 4)  Left Shoulder Surgery  () 5)  Adenocarcinoma, Breast, Hx of  (ICD-V10.3) 6)  Syncope, Hx of  (ICD-V12.49) 7)  Aortic Sclerosis  (ICD-424.1) 8)  Fracture, Pelvis, Left, Hx of  (ICD-808.8) 9)  Fracture, Femur, Right, Hx of  (ICD-821.00) 10)  Mastectomy, Left, Hx of  (ICD-V45.71) 11)   Coumadin Therapy  (ICD-V58.61) 12)  Encounter For Long-term Use of Anticoagulants  (ICD-V58.61) 13)  Atrial Fibrillation  (ICD-427.31) 14)  Hyperlipidemia, With High Hdl  (ICD-272.4) 15)  Atrial Fibrillation  (ICD-427.31) 16)  Coumadin Therapy  (ICD-V58.61) 17)  Encounter For Therapeutic Drug Monitoring  (ICD-V58.83) 18)  Acute Sinusitis, Unspecified  (ICD-461.9) 19)  Uri  (ICD-465.9) 20)  Osteoporosis  (ICD-733.00) 21)  Osteoarthritis  (ICD-715.90) 22)  Hypertension  (ICD-401.9) 23)  Diabetes Mellitus, Type II  (ICD-250.00) 24)  Atrial Fibrillation  (ICD-427.31) 25)  Osteoporosis  (ICD-733.00) 26)  Hypertension  (ICD-401.9) 27)  Diabetes Mellitus, Type II  (ICD-250.00) 28)  Atrial Fibrillation  (ICD-427.31) 29)  Coumadin Therapy  (ICD-V58.61) 30)  Encounter For Therapeutic Drug Monitoring  (ICD-V58.83)  Current Problems (verified): 1)  Hysterectomy  (ICD-V88.01) 2)  Left Shoulder Surgery  () 3)  Adenocarcinoma, Breast, Hx of  (ICD-V10.3) 4)  Syncope, Hx of  (ICD-V12.49) 5)  Aortic Sclerosis  (ICD-424.1) 6)  Fracture, Pelvis, Left, Hx of  (ICD-808.8) 7)  Fracture, Femur, Right, Hx of  (ICD-821.00) 8)  Mastectomy, Left, Hx of  (ICD-V45.71) 9)  Coumadin Therapy  (ICD-V58.61) 10)  Encounter For Long-term Use of Anticoagulants  (ICD-V58.61) 11)  Atrial Fibrillation  (ICD-427.31) 12)  Hyperlipidemia, With High Hdl  (ICD-272.4) 13)  Atrial Fibrillation  (ICD-427.31) 14)  Coumadin Therapy  (ICD-V58.61) 15)  Encounter For Therapeutic  Drug Monitoring  (ICD-V58.83) 16)  Acute Sinusitis, Unspecified  (ICD-461.9) 17)  Uri  (ICD-465.9) 18)  Osteoporosis  (ICD-733.00) 19)  Osteoarthritis  (ICD-715.90) 20)  Hypertension  (ICD-401.9) 21)  Diabetes Mellitus, Type II  (ICD-250.00) 22)  Atrial Fibrillation  (ICD-427.31) 23)  Osteoporosis  (ICD-733.00) 24)  Hypertension  (ICD-401.9) 25)  Diabetes Mellitus, Type II  (ICD-250.00) 26)  Atrial Fibrillation  (ICD-427.31) 27)  Coumadin Therapy   (ICD-V58.61) 28)  Encounter For Therapeutic Drug Monitoring  (ICD-V58.83)  Medications Prior to Update: 1)  Evista 60 Mg  Tabs (Raloxifene Hcl) .... Once Daily 2)  Losartan Potassium-Hctz 100-12.5 Mg Tabs (Losartan Potassium-Hctz) .... One By Mouth Daily 3)  Cardizem Cd 240 Mg  Cp24 (Diltiazem Hcl Coated Beads) .... Once Daily 4)  Klor-Con 8 Meq  Tbcr (Potassium Chloride) .... Once Daily 5)  Caltrate 600+d 600-400 Mg-Unit Tabs (Calcium Carbonate-Vitamin D) .[name].. 1 Two Times A Day 6)  Magnesium 250 Mg Tabs (Magnesium) .... Three Times A Day 7)  Reclast 5 Mg/122ml  Soln (Zoledronic Acid) .... Yearly 8)  Warfarin Sodium 5 Mg Tabs (Warfarin Sodium) .... 7.5, Daily 9)  Digoxin 0.125 Mg  Tabs (Digoxin) .... Take 1 By Mouth Qd 10)  Alprazolam 0.25 Mg  Tabs (Alprazolam) .... One By Mouth Q Hs 11)  Truetest Test   Strp (Glucose Blood) .... Two Times A Day 12)  Kombiglyze Xr 2.09-998 Mg Xr24h-Tab (Saxagliptin-Metformin) .... One By Mouth Two Times A Day 13)  Actos 45 Mg Tabs (Pioglitazone Hcl) .[name].. 1 Once Daily 14)  Crestor 5 Mg Tabs (Rosuvastatin Calcium) .[name].. 1 By Mouth  Mwf Weekly 15)  Multivitamins  Tabs (Multiple Vitamin) .[name].. 1 Once Daily 16)  Vitamin C Cr 500 Mg Cr-Caps (Ascorbic Acid) .[name].. 1 Once Daily 17)  Vitamin D 2000 Unit Tabs (Cholecalciferol) .[name].. 1 Once Daily 18)  B Complex-B12  Tabs (B Complex Vitamins) .[name].. 1 Once Daily 19)  Metoprolol Tartrate 50 Mg Tabs (Metoprolol Tartrate) .[name].. 1 Two Times A Day  Current Medications (verified): 1)  Evista 60 Mg  Tabs (Raloxifene Hcl) .... Once Daily 2)  Losartan Potassium-Hctz 100-12.5 Mg Tabs (Losartan Potassium-Hctz) .... One By Mouth Daily 3)  Cardizem Cd 240 Mg  Cp24 (Diltiazem Hcl Coated Beads) .... Once Daily 4)  Klor-Con 8 Meq  Tbcr (Potassium Chloride) .... Once Daily 5)  Caltrate 600+d 600-400 Mg-Unit Tabs (Calcium Carbonate-Vitamin D) .[name].. 1 Two Times A Day 6)  Magnesium 250 Mg Tabs (Magnesium) .... Three Times A Day 7)  Reclast 5  Mg/189ml  Soln (Zoledronic Acid) .... Yearly 8)  Warfarin Sodium 5 Mg Tabs (Warfarin Sodium) .... 7.5, Daily 9)  Digoxin 0.125 Mg  Tabs (Digoxin) .... Take 1 By Mouth Qd 10)  Alprazolam 0.5 Mg Tabs (Alprazolam) .... One By Mouth Q Hs 11)  Truetest Test   Strp (Glucose Blood) .... Two Times A Day 12)  Kombiglyze Xr 2.09-998 Mg Xr24h-Tab (Saxagliptin-Metformin) .... One By Mouth Two Times A Day 13)  Actos 45 Mg Tabs (Pioglitazone Hcl) .[name].. 1 Once Daily 14)  Crestor 5 Mg Tabs (Rosuvastatin Calcium) .[name].. 1 By Mouth  Mwf Weekly 15)  Multivitamins  Tabs (Multiple Vitamin) .[name].. 1 Once Daily 16)  Vitamin C Cr 500 Mg Cr-Caps (Ascorbic Acid) .[name].. 1 Once Daily 17)  Vitamin D 2000 Unit Tabs (Cholecalciferol) .[name].. 1 Once Daily 18)  B Complex-B12  Tabs (B Complex Vitamins) .[name].. 1 Once Daily 19)  Metoprolol Tartrate 50 Mg Tabs (Metoprolol Tartrate) .[name].. 1 Two Times  A Day  Allergies (verified): 1)  ! Codeine 2)  Codeine Sulfate (Codeine Sulfate) 3)  Codeine Sulfate (Codeine Sulfate) 4)  Codeine Sulfate (Codeine Sulfate)  Past History:  Family History: Last updated: 05/08/2007 mother CHF  at 20 father MI at 71  Social History: Last updated: 05/08/2007 Married Never Smoked Alcohol use-no Drug use-no Regular exercise-yes  Risk Factors: Exercise: yes (05/08/2007)  Risk Factors: Smoking Status: never (06/15/2010) Passive Smoke Exposure: no (06/15/2010)  Past medical, surgical, family and social histories (including risk factors) reviewed, and no changes noted (except as noted below).  Past Medical History: Reviewed history from 04/08/2010 and no changes required. ADENOCARCINOMA, BREAST, HX OF (ICD-V10.3) SYNCOPE, HX OF (ICD-V12.49 Nuclear stress... April, 2008... no ischemia... EF 65% EF  60%... echo.. February, 2008 Aortic valve sclerosis.... echo.... mild FRACTURE, PELVIS, LEFT, HX OF (ICD-808.8).. FRACTURE, FEMUR, RIGHT, HX OF (ICD-821.00) COUMADIN THERAPY (ICD-V58.61) ENCOUNTER  FOR LONG-TERM USE OF ANTICOAGULANTS (ICD-V58.61) ATRIAL FIBRILLATION (ICD-427.31)...infrequent.. paroxysmal.. Coumadin therapy HYPERLIPIDEMIA, WITH HIGH HDL (ICD-272.4) ENCOUNTER FOR THERAPEUTIC DRUG MONITORING (ICD-V58.83) ACUTE SINUSITIS, UNSPECIFIED (ICD-461.9) URI (ICD-465.9) OSTEOPOROSIS (ICD-733.00) OSTEOARTHRITIS (ICD-715.90) HYPERTENSION (ICD-401.9) DIABETES MELLITUS, TYPE II (ICD-250.00) HYPERTENSION (ICD-401.9) DIABETES MELLITUS, TYPE II (ICD-250.00)    Past Surgical History: Reviewed history from 12/17/2008 and no changes required. HYSTERECTOMY (ICD-V88.01) * LEFT SHOULDER SURGERY MASTECTOMY, LEFT, HX OF (ICD-V45.71)  Family History: Reviewed history from 05/08/2007 and no changes required. mother CHF  at 31 father MI at 60  Social History: Reviewed history from 05/08/2007 and no changes required. Married Never Smoked Alcohol use-no Drug use-no Regular exercise-yes  Review of Systems  The patient denies anorexia, fever, weight loss, weight gain, vision loss, decreased hearing, hoarseness, chest pain, syncope, dyspnea on exertion, peripheral edema, prolonged cough, headaches, hemoptysis, abdominal pain, melena, hematochezia, severe indigestion/heartburn, hematuria, incontinence, genital sores, muscle weakness, suspicious skin lesions, transient blindness, difficulty walking, depression, unusual weight change, abnormal bleeding, enlarged lymph nodes, angioedema, and breast masses.    Physical Exam  General:  Well-developed,well-nourished,in no acute distress; alert,appropriate and cooperative throughout examination Eyes:  pupils equal and pupils round.   Nose:  no external deformity and nasal dischargemucosal pallor.   Mouth:  pharynx pink and moist and no erythema.   Neck:  No deformities, masses, or tenderness noted. Lungs:  Normal respiratory effort, chest expands symmetrically. Lungs are clear to auscultation, no crackles or wheezes. Heart:  normal rate and  regular rhythm.   Abdomen:  Bowel sounds positive,abdomen soft and non-tender without masses, organomegaly or hernias noted. Neurologic:  alert & oriented X3 and finger-to-nose normal.    Diabetes Management Exam:    Foot Exam (with socks and/or shoes not present):       Sensory-Pinprick/Light touch:          Left medial foot (L-4): diminished          Left dorsal foot (L-5): diminished          Left lateral foot (S-1): diminished          Right medial foot (L-4): diminished          Right dorsal foot (L-5): diminished          Right lateral foot (S-1): diminished       Sensory-Monofilament:          Left foot: diminished          Right foot: diminished   Impression & Recommendations:  Problem # 1:  ATRIAL FIBRILLATION (ICD-427.31) Assessment Unchanged  Her updated medication list for this problem includes:  Cardizem Cd 240 Mg Cp24 (Diltiazem hcl coated beads) ..... Once daily    Warfarin Sodium 5 Mg Tabs (Warfarin sodium) .[name]... 7.5, daily    Digoxin 0.125 Mg Tabs (Digoxin) .[name]... Take 1 by mouth qd    Metoprolol Tartrate 50 Mg Tabs (Metoprolol tartrate) .[name]... 1 two times a day  Reviewed the following: PT: 19.8 (10/01/2009)   INR: 2.5 (05/27/2010) Next Protime: 4 weeks (dated on 05/27/2010)  Problem # 2:  HYPERTENSION (ICD-401.9) Assessment: Unchanged  Her updated medication list for this problem includes:    Losartan Potassium-hctz 100-12.5 Mg Tabs (Losartan potassium-hctz) ..... One by mouth daily    Cardizem Cd 240 Mg Cp24 (Diltiazem hcl coated beads) ..... Once daily    Metoprolol Tartrate 50 Mg Tabs (Metoprolol tartrate) .[name]... 1 two times a day  BP today: 124/70 Prior BP: 142/64 (04/08/2010)  10 Yr Risk Heart Disease: 9 % Prior 10 Yr Risk Heart Disease: 11 % (12/17/2009)  Labs Reviewed: K+: 3.8 (11/27/2008) Creat: : .07 (03/23/2010)   Chol: 156 (03/23/2010)   HDL: 67 (03/23/2010)   LDL: 81 (03/23/2010)   TG: 214 (09/11/2009)  Problem # 3:  ANXIETY STATE,  UNSPECIFIED (ICD-300.00) Assessment: Deteriorated  Her updated medication list for this problem includes:    Alprazolam 0.5 Mg Tabs (Alprazolam) ..... One by mouth q hs  Discussed medication use and relaxation techniques.   Problem # 4:  HYPERLIPIDEMIA, WITH HIGH HDL (ICD-272.4) Assessment: Improved  Her updated medication list for this problem includes:    Crestor 5 Mg Tabs (Rosuvastatin calcium) .[name]... 1 by mouth  mwf weekly  Labs Reviewed: SGOT: 7 (11/27/2008)   SGPT: 17 (11/27/2008)  Lipid Goals: Chol Goal: 200 (03/10/2009)   HDL Goal: 40 (03/10/2009)   LDL Goal: 100 (03/10/2009)   TG Goal: 150 (03/10/2009)  10 Yr Risk Heart Disease: 9 % Prior 10 Yr Risk Heart Disease: 11 % (12/17/2009)   HDL:67 (03/23/2010), 59 (09/11/2009)  LDL:81 (03/23/2010), 111 (16/02/9603)  Chol:156 (03/23/2010), 186 (09/11/2009)  Trig:214 (09/11/2009), 131 (06/13/2009)  Complete Medication List: 1)  Evista 60 Mg Tabs (Raloxifene hcl) .... Once daily 2)  Losartan Potassium-hctz 100-12.5 Mg Tabs (Losartan potassium-hctz) .... One by mouth daily 3)  Cardizem Cd 240 Mg Cp24 (Diltiazem hcl coated beads) .... Once daily 4)  Klor-con 8 Meq Tbcr (Potassium chloride) .... Once daily 5)  Caltrate 600+d 600-400 Mg-unit Tabs (Calcium carbonate-vitamin d) .[name].. 1 two times a day 6)  Magnesium 250 Mg Tabs (Magnesium) .... Three times a day 7)  Reclast 5 Mg/116ml Soln (Zoledronic acid) .... Yearly 8)  Warfarin Sodium 5 Mg Tabs (Warfarin sodium) .... 7.5, daily 9)  Digoxin 0.125 Mg Tabs (Digoxin) .... Take 1 by mouth qd 10)  Alprazolam 0.5 Mg Tabs (Alprazolam) .... One by mouth q hs 11)  Truetest Test Strp (Glucose blood) .... Two times a day 12)  Kombiglyze Xr 2.09-998 Mg Xr24h-tab (Saxagliptin-metformin) .... One by mouth two times a day 13)  Actos 45 Mg Tabs (Pioglitazone hcl) .[name].. 1 once daily 14)  Crestor 5 Mg Tabs (Rosuvastatin calcium) .[name].. 1 by mouth  mwf weekly 15)  Multivitamins Tabs (Multiple vitamin) .[name]..  1 once daily 16)  Vitamin C Cr 500 Mg Cr-caps (Ascorbic acid) .[name].. 1 once daily 17)  Vitamin D 2000 Unit Tabs (Cholecalciferol) .[name].. 1 once daily 18)  B Complex-b12 Tabs (B complex vitamins) .[name].. 1 once daily 19)  Metoprolol Tartrate 50 Mg Tabs (Metoprolol tartrate) .[name].. 1 two times a day  Other Orders: T-Thoracic  Spine 2 Views [phone]) T-Lumbar Spine Comp w/Bend View [phone])  Hypertension Assessment/Plan:      The patient's hypertensive risk group is category C: Target organ damage and/or diabetes.  Her calculated 10 year risk of coronary heart disease is 9 %.  Today's blood pressure is 124/70.  Her blood pressure goal is < 130/80.  Patient Instructions: 1)  Please schedule a follow-up appointment in 4 months. Prescriptions: ALPRAZOLAM 0.5 MG TABS (ALPRAZOLAM) one by mouth q HS  #30 x 3   Entered and Authorized by:   Stacie Glaze MD   Signed by:   Stacie Glaze MD on 06/15/2010   Method used:   Print then Give to Patient   RxID:   [id number] CARDIZEM CD 240 MG  CP24 (DILTIAZEM HCL COATED BEADS) once daily  #90 x 3   Entered by:   Willy Eddy, LPN   Authorized by:   Stacie Glaze MD   Signed by:   Willy Eddy, LPN on 57/84/6962   Method used:   Print then Give to Patient   RxID:   [id number] ALPRAZOLAM 0.25 MG  TABS (ALPRAZOLAM) one by mouth q HS  #90 x 1   Entered by:   Willy Eddy, LPN   Authorized by:   Stacie Glaze MD   Signed by:   Willy Eddy, LPN on 53/66/4403   Method used:   Print then Give to Patient   RxID:   [id number] IRJJOACZ POTASSIUM-HCTZ 100-12.5 MG TABS (LOSARTAN POTASSIUM-HCTZ) one by mouth daily  #90 x 3   Entered by:   Willy Eddy, LPN   Authorized by:   Stacie Glaze MD   Signed by:   Willy Eddy, LPN on 66/10/3014   Method used:   Print then Give to Patient   RxID:   [id number] METOPROLOL TARTRATE 50 MG TABS (METOPROLOL TARTRATE) 1 two times a day  #180 x 3   Entered by:   Willy Eddy, LPN   Authorized by:   Stacie Glaze MD   Signed by:   Willy Eddy, LPN on 42/70/6237   Method used:   Print then Give to Patient   RxID:   [id number] KLOR-CON 8 MEQ  TBCR (POTASSIUM CHLORIDE) once daily  #90 x 3   Entered by:   Willy Eddy, LPN   Authorized by:   Stacie Glaze MD   Signed by:   Willy Eddy, LPN on 11/17/9483   Method used:   Print then Give to Patient   RxID:   [id number] WARFARIN SODIUM 5 MG TABS (WARFARIN SODIUM) 7.5, daily  #100 Tablet x 3   Entered by:   Willy Eddy, LPN   Authorized by:   Stacie Glaze MD   Signed by:   Willy Eddy, LPN on 93/71/6967   Method used:   Print then Give to Patient   RxID:   [id number] DIGOXIN 0.125 MG  TABS (DIGOXIN) take 1 by mouth qd  #90 Tablet x 3   Entered by:   Willy Eddy, LPN   Authorized by:   Stacie Glaze MD   Signed by:   Willy Eddy, LPN on 77/82/4235   Method used:   Print then Give to Patient   RxID:   [id number]    Orders Added: 1)  Est. Patient Level IV [95093] 2)  T-Thoracic Spine 2 Views [72070TC] 3)  T-Lumbar Spine Comp  w/Bend View [72114TC]

## 2010-07-14 ENCOUNTER — Ambulatory Visit: Payer: Medicare Other | Admitting: Physical Therapy

## 2010-07-16 ENCOUNTER — Ambulatory Visit: Payer: Medicare Other | Admitting: Physical Therapy

## 2010-07-22 ENCOUNTER — Ambulatory Visit: Payer: Medicare Other

## 2010-07-23 ENCOUNTER — Encounter: Payer: Medicare Other | Admitting: Physical Therapy

## 2010-07-24 ENCOUNTER — Ambulatory Visit: Payer: Medicare Other

## 2010-07-27 ENCOUNTER — Other Ambulatory Visit (HOSPITAL_COMMUNITY): Payer: Self-pay | Admitting: Oncology

## 2010-07-27 DIAGNOSIS — Z1239 Encounter for other screening for malignant neoplasm of breast: Secondary | ICD-10-CM

## 2010-07-29 ENCOUNTER — Other Ambulatory Visit (INDEPENDENT_AMBULATORY_CARE_PROVIDER_SITE_OTHER): Payer: Medicare Other | Admitting: Internal Medicine

## 2010-07-29 DIAGNOSIS — I4891 Unspecified atrial fibrillation: Secondary | ICD-10-CM

## 2010-07-29 LAB — POCT INR: INR: 2.3

## 2010-07-31 ENCOUNTER — Other Ambulatory Visit: Payer: Self-pay | Admitting: Internal Medicine

## 2010-08-10 LAB — COMPREHENSIVE METABOLIC PANEL
ALT: 10 U/L (ref 0–35)
AST: 21 U/L (ref 0–37)
Albumin: 3.8 g/dL (ref 3.5–5.2)
Alkaline Phosphatase: 73 U/L (ref 39–117)
BUN: 17 mg/dL (ref 6–23)
CO2: 32 mEq/L (ref 19–32)
Calcium: 9.4 mg/dL (ref 8.4–10.5)
Chloride: 102 mEq/L (ref 96–112)
Creatinine, Ser: 0.61 mg/dL (ref 0.4–1.2)
GFR calc Af Amer: >60 mL/min (ref >60)
GFR calc non Af Amer: >60 mL/min (ref >60)
Glucose, Bld: 103 mg/dL — ABNORMAL HIGH (ref 70–99)
Potassium: 4 mEq/L (ref 3.5–5.1)
Sodium: 138 mEq/L (ref 135–145)
Total Bilirubin: 0.5 mg/dL (ref 0.3–1.2)
Total Protein: 6 g/dL (ref 6.0–8.3)

## 2010-08-10 LAB — DIFFERENTIAL
Basophils Absolute: 0 10*3/uL (ref 0.0–0.1)
Basophils Relative: 0 % (ref 0–1)
Eosinophils Absolute: 0.2 10*3/uL (ref 0.0–0.7)
Eosinophils Relative: 2 % (ref 0–5)
Lymphocytes Relative: 20 % (ref 12–46)
Lymphs Abs: 1.6 10*3/uL (ref 0.7–4.0)
Monocytes Absolute: 0.8 10*3/uL (ref 0.1–1.0)
Monocytes Relative: 10 % (ref 3–12)
Neutro Abs: 5.5 10*3/uL (ref 1.7–7.7)
Neutrophils Relative %: 68 % (ref 43–77)

## 2010-08-10 LAB — CBC
HCT: 40.1 % (ref 36.0–46.0)
Hemoglobin: 13.9 g/dL (ref 12.0–15.0)
MCHC: 34.8 g/dL (ref 30.0–36.0)
MCV: 91.4 fL (ref 78.0–100.0)
Platelets: 173 10*3/uL (ref 150–400)
RBC: 4.38 MIL/uL (ref 3.87–5.11)
RDW: 15 % (ref 11.5–15.5)
WBC: 8.1 10*3/uL (ref 4.0–10.5)

## 2010-08-19 ENCOUNTER — Encounter: Payer: Self-pay | Admitting: Internal Medicine

## 2010-08-20 ENCOUNTER — Encounter: Payer: Self-pay | Admitting: Internal Medicine

## 2010-08-20 ENCOUNTER — Ambulatory Visit (INDEPENDENT_AMBULATORY_CARE_PROVIDER_SITE_OTHER): Payer: Medicare Other | Admitting: Internal Medicine

## 2010-08-20 VITALS — BP 136/80 | HR 72 | Temp 98.4°F | Resp 16 | Ht 65.0 in | Wt 160.0 lb

## 2010-08-20 DIAGNOSIS — K5732 Diverticulitis of large intestine without perforation or abscess without bleeding: Secondary | ICD-10-CM

## 2010-08-20 DIAGNOSIS — R109 Unspecified abdominal pain: Secondary | ICD-10-CM

## 2010-08-20 DIAGNOSIS — K5730 Diverticulosis of large intestine without perforation or abscess without bleeding: Secondary | ICD-10-CM

## 2010-08-20 DIAGNOSIS — I4891 Unspecified atrial fibrillation: Secondary | ICD-10-CM

## 2010-08-20 DIAGNOSIS — K5792 Diverticulitis of intestine, part unspecified, without perforation or abscess without bleeding: Secondary | ICD-10-CM

## 2010-08-20 LAB — CBC WITH DIFFERENTIAL/PLATELET
Basophils Absolute: 0 10*3/uL (ref 0.0–0.1)
Basophils Relative: 0.7 % (ref 0.0–3.0)
Eosinophils Absolute: 0.1 10*3/uL (ref 0.0–0.7)
Eosinophils Relative: 1.9 % (ref 0.0–5.0)
HCT: 40.3 % (ref 36.0–46.0)
Hemoglobin: 13.5 g/dL (ref 12.0–15.0)
Lymphocytes Relative: 27.9 % (ref 12.0–46.0)
Lymphs Abs: 1.8 10*3/uL (ref 0.7–4.0)
MCHC: 33.5 g/dL (ref 30.0–36.0)
MCV: 92.3 fl (ref 78.0–100.0)
Monocytes Absolute: 0.6 10*3/uL (ref 0.1–1.0)
Monocytes Relative: 10.1 % (ref 3.0–12.0)
Neutro Abs: 3.8 10*3/uL (ref 1.4–7.7)
Neutrophils Relative %: 59.4 % (ref 43.0–77.0)
Platelets: 212 10*3/uL (ref 150.0–400.0)
RBC: 4.37 Mil/uL (ref 3.87–5.11)
RDW: 14.4 % (ref 11.5–14.6)
WBC: 6.4 10*3/uL (ref 4.5–10.5)

## 2010-08-20 NOTE — Assessment & Plan Note (Signed)
The patient has recurrent atrial fibrillation and a rate controlled environment she is not lightheaded nor short of breath with it

## 2010-08-20 NOTE — Progress Notes (Signed)
Subjective:    Patient ID: Tara Bradley, female    DOB: [birth year], 77 y.o.   MRN: [id number]  HPI Patient is an 77 year old female with a history of diabetes hyperlipidemia hypertension and atrial fibrillation on Coumadin who presents after a episode of presumed diverticulitis.  She was seen in emergency room after she developed abdominal pain and tenderness in her left lower quadrant was evaluated and felt to have acute diverticulitis was given ciprofloxacin and metronidazole and this has since resolved the pain in the left flank.  I suspect that she has a history of diverticulosis her last colon was greater than 10 years ago but because of her age and Coumadin therapy further colonoscopies have been deferred for screening purposes she has no rectal bleeding no evidence of recurrent diverticulitis and this is her first and only episode of diverticulosis   Review of Systems  Constitutional: Negative for activity change, appetite change and fatigue.  HENT: Negative for ear pain, congestion, neck pain, postnasal drip and sinus pressure.   Eyes: Negative for redness and visual disturbance.  Respiratory: Negative for cough, shortness of breath and wheezing.   Gastrointestinal: Negative for abdominal pain and abdominal distention.  Genitourinary: Negative for dysuria, frequency and menstrual problem.  Musculoskeletal: Negative for myalgias, joint swelling and arthralgias.  Skin: Negative for rash and wound.  Neurological: Negative for dizziness, weakness and headaches.  Hematological: Negative for adenopathy. Does not bruise/bleed easily.  Psychiatric/Behavioral: Negative for sleep disturbance and decreased concentration.   Past Medical History  Diagnosis Date  . Personal history of malignant neoplasm of breast   . Personal history of other disorders of nervous system and sense organs   . Unspecified closed fracture of pelvis   . Closed fracture of unspecified part of femur   .  Encounter for long-term (current) use of anticoagulants   . Atrial fibrillation   . Hyperlipidemia   . Osteoporosis   . Arthritis   . Hypertension   . Diabetes mellitus    Past Surgical History  Procedure Date  . Abdominal hysterectomy   . Shoulder surgery     left  . Mastectomy     left    reports that she has never smoked. She does not have any smokeless tobacco history on file. She reports that she does not drink alcohol or use illicit drugs. family history includes Heart attack (age of onset:87) in her father and Heart failure (age of onset:84) in her mother. Allergies  Allergen Reactions  . Codeine   . Codeine Sulfate     REACTION: unspecified       Objective:   Physical Exam  Constitutional: She is oriented to person, place, and time. She appears well-developed and well-nourished. No distress.  HENT:  Head: Normocephalic and atraumatic.  Right Ear: External ear normal.  Left Ear: External ear normal.  Nose: Nose normal.  Mouth/Throat: Oropharynx is clear and moist.  Eyes: Conjunctivae and EOM are normal. Pupils are equal, round, and reactive to light.  Neck: Normal range of motion. Neck supple. No JVD present. No tracheal deviation present. No thyromegaly present.  Cardiovascular: Normal rate, regular rhythm, normal heart sounds and intact distal pulses.   No murmur heard. Pulmonary/Chest: Effort normal and breath sounds normal. She has no wheezes. She exhibits no tenderness.  Abdominal: Soft. Bowel sounds are normal. She exhibits no distension and no mass. There is tenderness. There is no rebound and no guarding.  Musculoskeletal: Normal range of motion. She exhibits no edema and  no tenderness.  Lymphadenopathy:    She has no cervical adenopathy.  Neurological: She is alert and oriented to person, place, and time. She has normal reflexes. No cranial nerve deficit.  Skin: Skin is warm and dry. She is not diaphoretic.  Psychiatric: She has a normal mood and affect.  Her behavior is normal.          Assessment & Plan:  Patient is a high functioning 77 year old female with a single episode of diverticulosis and her colonoscopy which was due last year was deferred due to her age and Coumadin therapy given this episode I feel that a gastroenterology consultation is appropriate with consideration for flexible sigmoidoscopy to assess the status of her diverticuli other imaging modalities might be a barium enema but I will defer to gastroenterology for testing determination.  We have referred her to gastrostomy for consult we will obtain a CBC differential today to assure that all infection is out of her system

## 2010-08-20 NOTE — Assessment & Plan Note (Signed)
The patient has a history of atrial fibrillation and this volume shifts due to the infection may have flared her atrial fibrillation we'll monitor his CBC today as well as monitor her A. fib

## 2010-08-21 LAB — DIGOXIN LEVEL: Digoxin Level: 1.1 ng/mL (ref 0.8–2.0)

## 2010-08-26 ENCOUNTER — Ambulatory Visit (INDEPENDENT_AMBULATORY_CARE_PROVIDER_SITE_OTHER): Payer: Medicare Other

## 2010-08-26 ENCOUNTER — Telehealth: Payer: Self-pay | Admitting: Internal Medicine

## 2010-08-26 DIAGNOSIS — I4891 Unspecified atrial fibrillation: Secondary | ICD-10-CM

## 2010-08-26 DIAGNOSIS — K5790 Diverticulosis of intestine, part unspecified, without perforation or abscess without bleeding: Secondary | ICD-10-CM

## 2010-08-26 LAB — POCT INR: INR: 1.5

## 2010-08-26 NOTE — Telephone Encounter (Signed)
Left message on machine Referral made will take a while

## 2010-08-26 NOTE — Telephone Encounter (Signed)
Pt is wondering if Dr Lovell Sheehan has contacted Dr Russella Dar about GI for Stomach issues. Pls call back asap with info.

## 2010-08-26 NOTE — Patient Instructions (Signed)
10 mg,10 mg for only 2 days then back to same dose 7.5mg  ,10 mg alternate

## 2010-09-01 LAB — COMPREHENSIVE METABOLIC PANEL
ALT: 11 U/L (ref 0–35)
AST: 22 U/L (ref 0–37)
Albumin: 3.8 g/dL (ref 3.5–5.2)
Alkaline Phosphatase: 77 U/L (ref 39–117)
BUN: 17 mg/dL (ref 6–23)
CO2: 31 mEq/L (ref 19–32)
Calcium: 10.1 mg/dL (ref 8.4–10.5)
Chloride: 100 mEq/L (ref 96–112)
Creatinine, Ser: 0.66 mg/dL (ref 0.4–1.2)
GFR calc Af Amer: >60 mL/min (ref >60)
GFR calc non Af Amer: >60 mL/min (ref >60)
Glucose, Bld: 106 mg/dL — ABNORMAL HIGH (ref 70–99)
Potassium: 4 mEq/L (ref 3.5–5.1)
Sodium: 139 mEq/L (ref 135–145)
Total Bilirubin: 0.6 mg/dL (ref 0.3–1.2)
Total Protein: 6.3 g/dL (ref 6.0–8.3)

## 2010-09-01 LAB — DIFFERENTIAL
Basophils Absolute: 0 10*3/uL (ref 0.0–0.1)
Basophils Relative: 0 % (ref 0–1)
Eosinophils Absolute: 0.1 10*3/uL (ref 0.0–0.7)
Eosinophils Relative: 2 % (ref 0–5)
Lymphocytes Relative: 20 % (ref 12–46)
Lymphs Abs: 1.4 10*3/uL (ref 0.7–4.0)
Monocytes Absolute: 0.6 10*3/uL (ref 0.1–1.0)
Monocytes Relative: 9 % (ref 3–12)
Neutro Abs: 4.7 10*3/uL (ref 1.7–7.7)
Neutrophils Relative %: 70 % (ref 43–77)

## 2010-09-01 LAB — CBC
HCT: 41.2 % (ref 36.0–46.0)
Hemoglobin: 14.7 g/dL (ref 12.0–15.0)
MCHC: 35.6 g/dL (ref 30.0–36.0)
MCV: 91.7 fL (ref 78.0–100.0)
Platelets: 203 10*3/uL (ref 150–400)
RBC: 4.49 MIL/uL (ref 3.87–5.11)
RDW: 14.1 % (ref 11.5–15.5)
WBC: 6.8 10*3/uL (ref 4.0–10.5)

## 2010-09-08 ENCOUNTER — Ambulatory Visit
Admission: RE | Admit: 2010-09-08 | Discharge: 2010-09-08 | Disposition: A | Discharge status: Home / Self Care | Payer: Medicare Other | Source: Ambulatory Visit | Attending: Oncology | Admitting: Oncology

## 2010-09-08 ENCOUNTER — Ambulatory Visit (INDEPENDENT_AMBULATORY_CARE_PROVIDER_SITE_OTHER): Payer: Medicare Other | Admitting: Internal Medicine

## 2010-09-08 DIAGNOSIS — I4891 Unspecified atrial fibrillation: Secondary | ICD-10-CM

## 2010-09-08 DIAGNOSIS — Z1239 Encounter for other screening for malignant neoplasm of breast: Secondary | ICD-10-CM

## 2010-09-08 LAB — POCT INR: INR: 2.4

## 2010-09-08 NOTE — Patient Instructions (Signed)
same dose 7.5mg  ,10 mg alternate, check in 4 weeks

## 2010-10-06 ENCOUNTER — Other Ambulatory Visit (HOSPITAL_COMMUNITY): Payer: Self-pay | Admitting: Oncology

## 2010-10-06 ENCOUNTER — Encounter (HOSPITAL_COMMUNITY): Payer: Medicare Other | Attending: Oncology | Admitting: Oncology

## 2010-10-06 DIAGNOSIS — Z79899 Other long term (current) drug therapy: Secondary | ICD-10-CM | POA: Insufficient documentation

## 2010-10-06 DIAGNOSIS — I10 Essential (primary) hypertension: Secondary | ICD-10-CM | POA: Insufficient documentation

## 2010-10-06 DIAGNOSIS — Z853 Personal history of malignant neoplasm of breast: Secondary | ICD-10-CM | POA: Insufficient documentation

## 2010-10-06 DIAGNOSIS — C50919 Malignant neoplasm of unspecified site of unspecified female breast: Secondary | ICD-10-CM

## 2010-10-06 DIAGNOSIS — Z09 Encounter for follow-up examination after completed treatment for conditions other than malignant neoplasm: Secondary | ICD-10-CM | POA: Insufficient documentation

## 2010-10-06 DIAGNOSIS — E119 Type 2 diabetes mellitus without complications: Secondary | ICD-10-CM | POA: Insufficient documentation

## 2010-10-06 DIAGNOSIS — M810 Age-related osteoporosis without current pathological fracture: Secondary | ICD-10-CM | POA: Insufficient documentation

## 2010-10-06 LAB — DIFFERENTIAL
Basophils Absolute: 0 10*3/uL (ref 0.0–0.1)
Basophils Relative: 1 % (ref 0–1)
Eosinophils Absolute: 0.1 10*3/uL (ref 0.0–0.7)
Eosinophils Relative: 2 % (ref 0–5)
Lymphocytes Relative: 25 % (ref 12–46)
Lymphs Abs: 1.4 10*3/uL (ref 0.7–4.0)
Monocytes Absolute: 0.5 10*3/uL (ref 0.1–1.0)
Monocytes Relative: 8 % (ref 3–12)
Neutro Abs: 3.8 10*3/uL (ref 1.7–7.7)
Neutrophils Relative %: 65 % (ref 43–77)

## 2010-10-06 LAB — COMPREHENSIVE METABOLIC PANEL
ALT: 8 U/L (ref 0–35)
AST: 22 U/L (ref 0–37)
Albumin: 3.6 g/dL (ref 3.5–5.2)
Alkaline Phosphatase: 63 U/L (ref 39–117)
BUN: 25 mg/dL — ABNORMAL HIGH (ref 6–23)
CO2: 32 mEq/L (ref 19–32)
Calcium: 10.3 mg/dL (ref 8.4–10.5)
Chloride: 101 mEq/L (ref 96–112)
Creatinine, Ser: 0.65 mg/dL (ref 0.4–1.2)
GFR calc Af Amer: >60 mL/min (ref >60)
GFR calc non Af Amer: >60 mL/min (ref >60)
Glucose, Bld: 99 mg/dL (ref 70–99)
Potassium: 3.9 mEq/L (ref 3.5–5.1)
Sodium: 141 mEq/L (ref 135–145)
Total Bilirubin: 0.3 mg/dL (ref 0.3–1.2)
Total Protein: 5.7 g/dL — ABNORMAL LOW (ref 6.0–8.3)

## 2010-10-06 LAB — CBC
HCT: 37.4 % (ref 36.0–46.0)
Hemoglobin: 12.5 g/dL (ref 12.0–15.0)
MCH: 31.1 pg (ref 26.0–34.0)
MCHC: 33.4 g/dL (ref 30.0–36.0)
MCV: 93 fL (ref 78.0–100.0)
Platelets: 175 10*3/uL (ref 150–400)
RBC: 4.02 MIL/uL (ref 3.87–5.11)
RDW: 14.9 % (ref 11.5–15.5)
WBC: 5.8 10*3/uL (ref 4.0–10.5)

## 2010-10-06 NOTE — Assessment & Plan Note (Signed)
[facility]                            CARDIOLOGY OFFICE NOTE   DONDA, FRIEDLI                     MRN:          [id number]  DATE:10/24/2007                            DOB:          1922/09/07    HISTORY OF PRESENT ILLNESS:  Ms. Palmatier is doing well.  She has some  rare palpitations.  Sometimes this will occur at nighttime.  However,  she is not limited during the day.  She is stable on her current  medicines.  She would like to take fewer medications, but she is stable  at this point.  We do not need to monitor at this time.  She is on  Coumadin.   PAST MEDICAL HISTORY:   ALLERGIES:  CODEINE.   MEDICATIONS:  1. Evista.  2. Glimepiride.  3. Metformin.  4. Metoprolol 50 b.i.d.  5. Coumadin.  6. Cardizem long-acting 240.  7. Multivitamin.  8. Micardis.  9. Hydrochlorothiazide.  10.Digoxin 0.125 daily.   OTHER MEDICAL PROBLEMS:  See the list below.   REVIEW OF SYSTEMS:  Review of systems otherwise is negative.   PHYSICAL EXAMINATION:  VITAL SIGNS:  Her weight continues to decrease by  her weight watching, and she is doing very well at 168 pounds.  Blood  pressure 160/78 with a pulse of 52.  The patient is oriented to person,  time and place.  Affect is normal.  HEENT:  Reveals no xanthelasma.  She has normal extraocular motion.  There are no carotid bruits.  There is no jugular venous tension.  LUNGS:  Clear.  Respiratory effort is not labored.  CARDIAC:  Reveals S1-S2.  There are no clicks or significant murmurs.  ABDOMEN:  Soft.  She has no peripheral edema.   STUDIES:  EKG reveals sinus bradycardia.   PROBLEMS:  1. Infrequent paroxysms of atrial fib.  She is on the appropriate      medicines and on Coumadin, and we will not make change.  2. Coumadin therapy.  3. Hyperlipidemia.  4. Diabetes.  5. Normal LV function.  6. Aortic valve sclerosis.  7. Status post right femur fracture and left pelvic fracture from a      fall  that was not syncope.  8. Status post left mastectomy.  9. Cardiac status is stable.  We will not change her medications.   I will see her back in 6 months.     Luis Abed, MD, [facility]  Electronically Signed    JDK/MedQ  DD: 10/24/2007  DT: 10/24/2007  Job #: [id number]   cc:   Stacie Glaze, MD

## 2010-10-06 NOTE — Assessment & Plan Note (Signed)
[facility]                            CARDIOLOGY OFFICE NOTE   MIAA, LATTERELL                     MRN:          [id number]  DATE:03/28/2007                            DOB:          1922/10/26    The patient was seen on March 09, 2007.  At that time, she had a burst  of atrial fibrillation.  We very carefully added digoxin to her other  medicines.  At first she said she did not feel well, but now she feels  better.  She returns today in sinus rhythm.  She has not had any syncope  or presyncope.  She is not having any chest pain.  We talked about her  slow resting heart rate.  Her blood pressure today is higher than  expected.   ALLERGIES:  CODEINE.   MEDICATIONS:  Evista, glimepiride, metformin, metoprolol, Coumadin,  Cardizem, calcium, magnesium, vitamin D, Micardis, hydrochlorothiazide,  and digoxin 0.125 mg daily.   PAST MEDICAL HISTORY:  See the complete list on my note of March 09, 2007.   REVIEW OF SYSTEMS:  She is feeling better and doing well at this time.   PHYSICAL EXAMINATION:  VITAL SIGNS:  Weight is 180, blood pressure is  higher than I would like to see it at 165/80 today.  The patient is  oriented to person, time, and place and her affect is normal.  HEENT:  No xanthelasma.  LUNGS:  Clear.  Respiratory effort is not labored.  HEART:  S1 with S2.  There are no clicks or significant murmurs.  ABDOMEN:  Soft.  She has no peripheral edema.   EKG today reveals that she is in sinus and it is sinus bradycardia.   The patient has both sinus bradycardia and atrial fibrillation with  rapid rates.  However, she is stable.  We will not change her medicines.  Because her blood pressure is high, however, I will see her back in 3-4  weeks to reassess her status.     Luis Abed, MD, [facility]  Electronically Signed    JDK/MedQ  DD: 03/28/2007  DT: 03/28/2007  Job #: [id number]

## 2010-10-06 NOTE — Assessment & Plan Note (Signed)
[facility]                            CARDIOLOGY OFFICE NOTE   INDIE, BOEHNE                     MRN:          [id number]  DATE:03/09/2007                            DOB:          [birth year]    Ms. Blyden is doing well. She has a history of three episodes of  palpitations with atrial fibrillation. She actually noticed this morning  and she is, in fact, in atrial fibrillation. She does not have chest  pain or shortness of breath. There is no syncope or pre-syncope. She is  on Coumadin.   PAST MEDICAL HISTORY:   ALLERGIES:  CODEINE.   MEDICATIONS:  1. Evista.  2. Glimepiride.  3. Metformin.  4. Metoprolol.  5. Coumadin.  6. Cardizem 240.  7. Potassium.  8. Calcium.  9. Magnesium.  10.Vitamin D.  11.Micardis.  12.Hydrochlorothiazide.  13.Fish oil.   OTHER MEDICAL PROBLEMS:  See the list below.   REVIEW OF SYSTEMS:  She did notice some palpitations this morning. She  is tolerating this without difficulties. Otherwise, her review of  systems is negative.   PHYSICAL EXAMINATION:  VITAL SIGNS:  Weight 182 pounds. This is down a  few pounds and she is trying to lose weight further. Blood pressure  116/84 with a pulse of 101. Her rhythm is irregularly irregular.  HEENT:  Reveals no xanthelasma. She has normal extraocular motion. There  are no carotid bruits. There is no jugular venous distension.  LUNGS:  Clear. Respiratory effort is not labored.  CARDIAC:  Irregularly irregular rhythm. There is an S1 with an S2.  ABDOMEN:  Soft. There are no masses or bruits. She has normal bowel  sounds.  EXTREMITIES:  There is no peripheral edema.  CHEST:  The patient does have some evidence of her old left mastectomy.   EKG reveals atrial fibrillation. The rate is increased at 101 at rest  here today.   PROBLEMS:  1. Infrequent episode of paroxysmal atrial fibrillation. She is having      one today. She is stable. Her rate is a little  higher than I would      like to see. We had seen some mild bradycardia in the past. I do      want to push her medications and I have decided to add Digoxin. We      will see her back soon though to be sure that she is tolerating the      medications and to recheck her rhythm.  2. Coumadin therapy. This has continued.  3. Hyperlipidemia, treated.  4. Diabetes, treated.  5. Normal left ventricular function.  6. Aortic valve sclerosis.  7. Status post right femur fracture and left pelvis fracture from a      fall that was syncope in the past.  8. Status post left mastectomy.   Overall, she is stable other than her atrial fibrillation. As outlined  above, I have decide to add Lanoxin and to follow her carefully. The  patient did have a Myoview scan in April 2008 that was normal. Her last  echocardiogram was done in February  2008, and she has normal LV  function.     Luis Abed, MD, [facility]  Electronically Signed    JDK/MedQ  DD: 03/09/2007  DT: 03/10/2007  Job #: [id number]   cc:   Stacie Glaze, MD

## 2010-10-06 NOTE — Assessment & Plan Note (Signed)
 [facility]                            CARDIOLOGY OFFICE NOTE   ADDYLIN, MANKE                     MRN:          [id number]  DATE:04/17/2007                            DOB:          [birth year]    [name] is doing well.  We recently added the digoxin for a short  burst of atrial fibrillation that she had, and she is feeling fine.  Early on, she thought she did not tolerate the digoxin, but she is quite  stable with it and doing well at this time.  Her digoxin level is  actually quite low on 0.125 mg daily, but we will continue this dose.  She is going about full activities.  She mentions that when at exercise  program, the monitor may suggest that she has a burst of rapid heart  beat.  She does not feel this, and it does not last long.  Usually she  knows when her heart beat is irregular.  We have considered monitoring  her, but I have decided this is not necessary at this point.   PAST MEDICAL HISTORY:   ALLERGIES:  CODEINE.   MEDICATIONS:  Evista, glimepiride, metformin, metoprolol, Coumadin,  Cardizem, calcium, magnesium, other vitamins, Micardis,  hydrochlorothiazide, fish oil, and digoxin 0.125 mg daily.   OTHER MEDICAL PROBLEMS:  See the list below.   REVIEW OF SYSTEMS:  She seems stable, and her review of systems is  negative.   PHYSICAL EXAM:  Her blood pressure at home is repeatedly in the range of  135 systolic.  When she first comes into our office, her blood pressure  systolic is elevated, and then it drops.  This is a repeated pattern.  I  am not inclined to adjust her medications at this time.  HEENT:  No xanthelasma.  She has normal extraocular motion.  LUNGS:  Clear.  CARDIAC:  An S1 with an S2.  There are no clicks or significant murmurs.  She has no significant edema.   Problems include:  1. Infrequent paroxysmal atrial fibrillation.  See the discussion      above.  We will keep her on low dose digoxin.  2.  Coumadin therapy.  3. Hyperlipidemia.  4. Diabetes.  5. Normal left ventricular function.  6. Aortic valve sclerosis.  7. Status post right femur fracture and left pelvis fracture from a      fall that was not syncope in the past.  8. Status post left mastectomy.   She is stable, and I will see her back.     Luis Abed, MD, [facility]  Electronically Signed    JDK/MedQ  DD: 04/17/2007  DT: 04/17/2007  Job #: [id number]   cc:   Stacie Glaze, MD

## 2010-10-08 ENCOUNTER — Ambulatory Visit: Payer: Medicare Other

## 2010-10-08 ENCOUNTER — Other Ambulatory Visit (HOSPITAL_COMMUNITY): Payer: Self-pay | Admitting: Oncology

## 2010-10-08 DIAGNOSIS — Z853 Personal history of malignant neoplasm of breast: Secondary | ICD-10-CM

## 2010-10-09 NOTE — Assessment & Plan Note (Signed)
[facility]                            CARDIOLOGY OFFICE NOTE   VY, BADLEY                     MRN:          [id number]  DATE:07/14/2006                            DOB:          [birth year]    [name] is a very pleasant 77 year old female.  She has paroxysmal  atrial fibrillation.  She was seen in our office on June 28, 2006,  and there is a very complete note about that visit.  Since that time,  she has been stable.  She has not had any significant recurring  palpitations.  When she has her atrial fibrillation she feels poorly.  She has not had any syncope; however, she feels slightly presyncope with  this.  She has had only infrequent episodes over time.  She had been on  Coumadin in the past.  She stopped her Coumadin.  She did this after she  had fallen and fractured her left pelvis and also at a different time  fractured her right femur.  She did not have syncope or presyncope.  I  explained to her today that being off Coumadin did not cause her to have  another episode.  She has been put back on her Toprol-XL and Cardizem  and she is in sinus rhythm and doing well.  She does have relative sinus  bradycardia today and reducing her beta blocker dose may be appropriate  over time.  She is not having any chest pain or shortness of breath.   PAST MEDICAL HISTORY:  Allergies:  CODEINE.   Medications:  1. Evista 60.  2. Glimepiride 1.  3. Metformin 500.  4. Metoprolol 50 b.i.d.  5. Micardis/hydrochlorothiazide 40/6.25.  6. Coumadin as directed.  7. Cardizem LA 240.  8. Potassium.  9. Calcium.  10.Magnesium.  11.Vitamin D.   Other medical problems:  See the list below.   REVIEW OF SYSTEMS:  She is doing well at this time.  She has not had any  recurring palpitations.  She may have very slight shortness of breath at  times but this is not a significant problem.  Otherwise, her review of  systems is negative.   PHYSICAL  EXAMINATION:  VITAL SIGNS:  Weight is 188 pounds.  Blood  pressure today is 150/81 with a pulse of 55 and she will need continued  followup of her blood pressure.  GENERAL:  The patient is oriented to person, time and place.  Affect is  normal.  HEENT:  There is no xanthelasma.  She has normal extraocular motion.  She has no carotid bruits.  There is no jugular venous distention.  LUNGS:  Clear.  Respiratory effort is not labored.  CARDIAC:  Reveals an S1 with an S2.  There are no clicks.  She does have  a 2/6 crescendo-decrescendo murmur along the left sternal border.  ABDOMEN:  Soft.  She has no masses or bruits.  EXTREMITIES:  She has no significant peripheral edema.   EKG does reveal sinus bradycardia.   Since her last visit, her TSH was checked and it is normal.  A 2-D  echocardiogram  was also checked.  She has only very mild aortic valve  sclerosis.  There was mild mitral annular calcification.  LV function  was normal.   PROBLEMS:  1. Infrequent paroxysmal atrial fibrillation.  She is now back in      sinus rhythm.  She does have sinus bradycardia.  I will see her      back in 6 weeks.  We may want to decrease her beta blocker dose.  2. Coumadin therapy.  I explained to her the importance of this.  3. Hyperlipidemia, being treated.  4. Diabetes, being treated.  5. Normal left ventricular function with very mild aortic valve      sclerosis.  6. Status post right femur fracture and left pelvis fracture from a      fall that was not related to syncope.   We had a lengthy discussion about her atrial fibrillation.  I explained  that this is random for her.  We of course talked about limiting  caffeine, which she does.  I told her that she could return to exercise,  that this will not increase the chance of causing her to have atrial  fibrillation.  She asked me about fish oil, which I approved.  She asked  about Coenzyme Q.  I responded that I felt this was not optimal for  her  at this time.  She also asked about vitamin K.  She thought that it was  related to bones.  I explained that vitamin D was the issue and that  vitamin K would be absolutely contraindicated for her, as she is on  Coumadin.   The discussion went well and she understands.  I will see her back for  followup in 6 weeks.     Luis Abed, MD, [facility]  Electronically Signed    JDK/MedQ  DD: 07/14/2006  DT: 07/14/2006  Job #: [id number]   cc:   Stacie Glaze, MD

## 2010-10-09 NOTE — Discharge Summary (Signed)
NAME:  Tara [facility], Tara Bradley NO.:  [id number]   MEDICAL RECORD NO.:  [id number]          PATIENT TYPE:  ORB   LOCATION:  4505                         FACILITY:  [facility]   PHYSICIAN:  Erick Colace, M.D.DATE OF BIRTH:  March 02, 1923   DATE OF ADMISSION:  01/05/2005  DATE OF DISCHARGE:  01/12/2005                                 DISCHARGE SUMMARY   DISCHARGE DIAGNOSIS:  1.  Right superior inferior pubic rami and acetabular fracture.  2.  Pain management.  3.  Chronic atrial fibrillation.  4.  Non-insulin dependent diabetes mellitus.  5.  Hypertension.  6.  History of right femoral shaft fracture in 2005.   HISTORY OF PRESENT ILLNESS:  This is an 77 year old white female with a  history of chronic atrial fibrillation on Coumadin therapy.  He was admitted  December 31, 2004, after a fall without loss of consciousness sustaining a  right superior and inferior pubic rami fracture and acetabular fracture,  noted fracture to be minimally displaced.  Orthopedic service Dr. Otelia Sergeant,  advised touch down weight-bearing with conservative care.  She remained on  chronic Coumadin therapy.  Cardiac status stable.  She was admitted for  comprehensive rehab program.   PAST MEDICAL HISTORY:  See discharge diagnoses.  No alcohol, no tobacco.   ALLERGIES:  Morphine and codeine.   SOCIAL HISTORY:  She lives with her 52 year old husband in [place].  The  husband can assist on discharge.  Local son check as needed.  They live in a  one level home with one step to enter.   MEDICATIONS PRIOR TO ADMISSION:  Coumadin 7.5 mg Monday, Wednesday, and  Friday, and 5 mg Tuesday, Thursday, Saturday, and Sunday, Cardizem 180 mg  daily, Aceon 4 mg b.i.d., Actos 30 mg daily, Toprol 50 mg daily,  hydrochlorothiazide 12.5 mg daily.   HOSPITAL COURSE:  The patient admitted to subacute rehab services.  The  following issues were followed during the patient's rehab course.  Pertaining Ms. Pann right  superior inferior pubic rami and acetabular  fracture which showed it to be minimally displaced, conservative care  provided by orthopedic services, Dr. Otelia Sergeant, she was touch down weight-  bearing.  Neurovascular sensation intact.  From a functional standpoint, she  was ambulating extended household distances with a walker, Home Health  therapies have been arranged.  She was using oxycodone as needed for pain  relief with good results.  She remained on chronic Coumadin therapy with no  bleeding episodes.  The latest INR of 2.3.  She was followed by Dr. Darryll Capers of [facility] prior to hospital admission.  Her  blood pressures remained well controlled with diastolics 72 to 76 and she  continued on her home regimen of Cardizem, hydrochlorothiazide, Toprol, and  her Aceon.  Blood sugars maintained 60, 170, and 82, on her Amaryl 1 mg  daily as well as diabetic diet and Actos 30 mg daily.  She had no bowel or  bladder disturbances.  The plan is to be discharged to home with Home Health  therapies.  The latest labs showed a hemoglobin  of 12.4, hematocrit 34.8,  platelet 248,000, WBC 7.9, sodium 139, potassium 3.8, BUN 12, creatinine  0.6, and an INR of 2.3.   DISCHARGE MEDICATIONS:  Coumadin, latest dose of 7.5 mg to be adjusted  accordingly, noted home regimen of Coumadin prior to admission was 7.5 mg  Monday, Wednesday, and Friday, and 5 mg Tuesday, Thursday, Saturday, and  Sunday, Cardizem CD 180 mg p.o. q.h.s., Amaryl 1 mg p.o. daily,  hydrochlorothiazide 12.5 mg p.o. daily, Toprol XL 50 mg daily, Actos 30 mg  p.o. daily, Mavik 2 mg p.o. b.i.d., multi-vitamin daily, and Darvocet as  needed for pain.   DISCHARGE INSTRUCTIONS:  Activity was touch down weight-bearing with walker.  Diet was diabetic diet.  Home Health nurse for prothrombin time, results to  Dr. Darryll Capers.  Home Health physical and occupational therapy.      Mariam Dollar, P.A.      Erick Colace, M.D.  Electronically Signed    DA/MEDQ  D:  01/11/2005  T:  01/11/2005  Job:  [id number]   cc:   Kerrin Champagne, M.D.  [address]  [place]  [place] [address]  Fax: [phone]   Willa Rough, M.D.  [address]  [address]  [place]  [place] [address]   Stacie Glaze, M.D. [facility]  [address] [place]  [place] [address]   Ladona Horns. Neijstrom, MD  [address]  [place]  [place] [address]  Fax: [phone]

## 2010-10-09 NOTE — Discharge Summary (Signed)
NAME:  BRICE, POTTEIGER NO.:  [id number]   MEDICAL RECORD NO.:  [id number]          PATIENT TYPE:  IPS   LOCATION:  4008                         FACILITY:  [facility]   PHYSICIAN:  Ellwood Dense, M.D.   DATE OF BIRTH:  [birth year]   DATE OF ADMISSION:  03/09/2004  DATE OF DISCHARGE:  03/16/2004                                 DISCHARGE SUMMARY   DISCHARGE DIAGNOSES:  1.  Displaced proximal third right femoral shaft fracture, status post      intramedullary nailing March 05, 2004.  2.  Pain management.  3.  Chronic atrial fibrillation with Coumadin therapy.  4.  Anemia.  5.  Non-insulin-dependent diabetes mellitus.  6.  Hypertension.  7.  Osteoporosis.  8.  Breast cancer with Tamoxifen therapy.   HISTORY OF PRESENT ILLNESS:  This is a 77 year old white female admitted to  [facility] March 02, 2004, after a fall without loss of  consciousness sustaining a displaced proximal third right femoral shaft  fracture.  Patient on chronic Coumadin for atrial fibrillation for  cardiology services.  INR on admission of 1.9 and was reversed.  Underwent  intramedullary nailing per Dr. Rennis Chris March 05, 2004.  Coumadin resumed.  Weightbearing as tolerated.  Postoperative pain control.  PCA morphine  discontinued March 06, 2004.  Postoperative anemia 8.7 and transfused.  Admitted for comprehensive rehab program.   PAST MEDICAL HISTORY:  See discharge diagnoses.   ALLERGIES:  1.  CODEINE.  2.  ERYTHROMYCIN.   SOCIAL HISTORY:  [place]  One  level home.  No steps to entry.  Husband can assist on discharge.   MEDICATIONS PRIOR TO ADMISSION:  1.  Coumadin.  2.  Toprol.  3.  Actos.  4.  Glucophage.  5.  Hydrochlorothiazide.  6.  Fosamax weekly.  7.  Mavik.   HOSPITAL COURSE:  Patient with progressive gains while on rehab services  with therapies initiated on a b.i.d. basis.  The following issues were  followed  during the patient's rehab course.  Pertaining to Ms. Bueno right  femoral shaft fracture intramedullary nailing March 05, 2004, Steri-Strips  remained in place.  Weightbearing as tolerated, ambulating supervision level  with an assistive device.  Full family teaching was completed.  She remained  on Vicodin as needed for pain control.  Chronic Coumadin therapy per [facility] noted during her rehab stay.  Some increased tachycardia of 140  beats per minute.  She remained totally asymptomatic.  She was placed on  intravenous Cardizem and titrated to 180 mg daily followed by [employer] [facility].  Her Toprol dose remained at 50 mg daily.  She would follow up  with Dr. Darryll Capers for ongoing Coumadin therapy.  Her latest INR was 2.0.  Blood sugars remained within acceptable limits at 87, 106 and 122.  Amaryl  had recently been initiated at 1 mg daily.  She also continued on her Actos.  She had no bowel or bladder disturbances throughout her rehab course.  She  was discharged to home with recommendations of home therapy.  Latest labs  showed hemoglobin 12.2, hematocrit 35.2, INR 2.0.  Sodium 137, potassium  4.1, BUN 15, creatinine 0.7.   DISCHARGE MEDICATIONS:  1.  Coumadin therapy with latest dose 12.5 mg.  This was adjusted      accordingly.  2.  Hydrochlorothiazide 12.5 mg daily.  3.  Actos 30 mg daily.  4.  Mavik 2 mg daily.  5.  Toprol XL 50 mg daily.  6.  Cardizem CD 180 mg daily.  7.  Amaryl 1 mg daily.  8.  Ultram 50 mg q.6h. as needed for pain.   ACTIVITY:  As tolerated.   DIET:  Diabetic diet.   WOUND CARE:  Cleanse incision daily with warm soap and water.   SPECIAL INSTRUCTIONS:  Home health nurse per [facility] to check INR  on Wednesday, March 18, 2004, with results to Dr. Darryll Capers of [facility].  She should follow up with Dr. Rennis Chris of orthopedic services,  call for appointment.       DA/MEDQ  D:  03/16/2004  T:  03/16/2004   Job:  [id number]   cc:   Willa Rough, M.D.   Vania Rea. Supple, M.D.  [facility]  [address]  [address]  [place]  [place] [address]  Fax: [phone]

## 2010-10-09 NOTE — Assessment & Plan Note (Signed)
[facility]                            CARDIOLOGY OFFICE NOTE   Tara Bradley, Tara Bradley                       MRN:          [id number]  DATE:06/28/2006                            DOB:          09-06-1922    PATIENT'S PHYSICIANS:  Stacie Glaze, M.D. and Luis Abed, M.D.   This is a very pleasant 77 year old white female who is referred to [place]  by Dr. Lovell Sheehan for recurrent atrial fibrillation.  The patient saw Dr.  Myrtis Ser back in April 2005 with presyncope and was found to be in atrial  fibrillation.  She had recurrent atrial fibrillation after having hip  surgery later that year.  She was placed on Toprol-XL, Cardizem and  Coumadin, and converted spontaneously to sinus rhythm.  She was then  managed by Dr. Lovell Sheehan and was doing well and has been off her Coumadin,  Cardizem and Toprol-XL for over a year now.  About 3 weeks ago she  noticed her heart began to race once again.  It is worse at night when  she lies down and the other night she says it was going extremely fast  and she became uncomfortable.  She does not notice it with exercise when  she walks on the treadmill.  She denies any chest pain, significant  shortness of breath with this.  Dr. Lovell Sheehan resumed her Coumadin, Toprol-  XL and Cardizem.  Her heart was still going a little bit fast so he  increased her Cardizem to 360 mg yesterday.  Since then, she is feeling  better.   CURRENT MEDICATIONS:  1. Coumadin as directed.  2. Evista 60 mg daily.  3. Glyburide 1 mg daily.  4. Metformin 500 mg daily.  5. Metoprolol 50 mg daily.  6. Micardis HCT 40/6.25 mg daily.  7. Calcium 1650 mg daily.  8. Magnesium 800 mg daily.  9. Vitamin D 2000 mg daily.  10.Multivitamin daily.  11.Cardizem 360 mg daily.   ALLERGIES:  No known drug allergies.   PAST MEDICAL HISTORY:  Significant for hypertension, hyperlipidemia,  diabetes mellitus, left mastectomy for breast cancer.   SOCIAL HISTORY:  She  is married, retired Runner, broadcasting/film/video.  She has two children.  She does not smoke or drink.   FAMILY HISTORY:  Her mother died of heart failure at age 50.  Father  died of an MI at age 74.   REVIEW OF SYSTEMS:  Negative for dizziness or presyncopal signs or  symptoms, dyspepsia, dysphagia, nausea, vomiting, change in bowels or  melena.  Cardiopulmonary:  See HPI.   PHYSICAL EXAMINATION:  GENERAL:  This is a pleasant 77 year old white  female in no acute distress.  VITAL SIGNS:  Blood pressure 129/66, pulse 53, weight 188.  NECK:  Without JVD, HJR, bruit, or thyroid enlargement.  LUNGS:  Clear anterior, posterior and lateral.  HEART:  Regular rate and rhythm at 50 beats per minute.  Normal S1 and  S2 with a soft 1/6 systolic murmur at the left sternal border.  ABDOMEN:  Soft without organomegaly, masses, lesions, or abnormal  tenderness.  EXTREMITIES:  Without cyanosis,  clubbing, edema.  She has good distal  pulses.   EKG:  Sinus bradycardia with PACs, LVH and poor R wave progression.  I  do not have an old EKG to compare it to, as her old chart is not here.   IMPRESSION:  1. Recurrent paroxysmal atrial fibrillation, rate better controlled      since Toprol-XL and Cardizem have been resumed.  2. Coumadin therapy.  3. Hyperlipidemia.  4. Diabetes mellitus.  5. History of normal left ventricular function, mild aortic valve      sclerosis on 2-D echocardiogram in 2005.   PLAN AT THIS TIME:  The patient is currently in normal sinus rhythm on  current medications.  She does know when she goes in and out of the  atrial fibrillation.  We will check thyroid function studies, CMET, and  2-D echocardiogram.  She will then follow up with Dr. Myrtis Ser in 2 weeks  for further assessment of her atrial fibrillation.  I have asked her to  call us if she has any further tachycardia or if her heart rate dips  below 50 and she becomes symptomatic.   Dr. Lovell Sheehan is managing her Coumadin and she is currently  receiving a  Lovenox bridge.      Jacolyn Reedy, PA-C  Electronically Signed      Jonelle Sidle, MD  Electronically Signed   ML/MedQ  DD: 06/28/2006  DT: 06/28/2006  Job #: [id number]

## 2010-10-09 NOTE — Op Note (Signed)
[facility]  Patient:    Tara Bradley, Tara Bradley Visit Number: [id number] MRN: [id number]          Service Type: SUR Location: 4W 0479 02 Attending Physician:  Marlowe Kays Page Dictated by:   Illene Labrador. Aplington, M.D. Proc. Date: 10/02/01 Admit Date:  10/02/2001                             Operative Report  PREOPERATIVE DIAGNOSES:  Generalized ligamentous laxity with recurrent anterior dislocation of his left shoulder status post posterior stabilization, left shoulder.  POSTOPERATIVE DIAGNOSES:  Generalized ligamentous laxity with recurrent anterior dislocation of his left shoulder status post posterior stabilization, left shoulder.  OPERATION PERFORMED:  Anterior capsular shift stabilization, left shoulder.  SURGEON:  Illene Labrador. Aplington, M.D.  ASSISTANT:  Clarene Reamer, P.A.-C  ANESTHESIA:  General.  PATHOLOGY AND JUSTIFICATION FOR PROCEDURE:  Perhaps 15-20 years ago, I had stabilized her shoulder posteriorly and it has been stable since. She has had many a history of recurrent anterior dislocations to the point now that it will dislocated in bed or simply with minimal activities and she felt now is the time for stabilization anteriorly. An MRI has demonstrated no labral damage.  DESCRIPTION OF PROCEDURE:  Prophylactic antibiotics, semi-sitting position on the regular OR table with an arm board adjacent to the table for resting the arm. Her shoulder was easily dislocatable and could be reduced just simply with position of the arm and I added several sheets beneath the posterior humerus to help keep the shoulder in reduced position. The left shoulder girdle was then prepped with Duraprep, draped in a sterile field, Ioban employed. A vertical incision from the coracoid down to the anterior axilla with some distortion of tissues due to prior mastectomy. The saphenous vein was demonstrated and protected. The deltopectoral interval was easily  found and the thin fascia over the conjoined tendons opened and they and the deltoid were retracted to either side with the self retaining spoon retractor. I identified the superior portion of the subscapularis and then opened it up several centimeters from its insertion with a knife blade more in the superficial portion and then retracted this portion of the subscapularis medially. The superior capsule was thicker than the inferior and there were two small areas of opening inferiorly that I reconstituted with interrupted #1 Ethibond. I then opened the capsule roughly a centimeter from its attachment to the humerus vertically and then split the capsule in two hemispheres superior and inferior. I then advanced the inferior capsule up tightly all the way up to the top of the gap of the open body of open end of the capsule and sutured it beneath the lateral capsule and subscapularis with multiple horizontal mattress sutures of #1 Ethibond. I then brought the superior capsule inferiorly and at its most inferior portion used a super mitek anchor to help stabilize it and then along the lateral border also stabilized it with multiple interrupted horizontal mattress sutures of #1 Ethibond. Prior to the capsular shift, I did create a bony trough with curette and rongeur at the area of the planned reattachment of the capsule. This seemed to give a nice stabilization anteriorly of the shoulder. The repair was made with the arm about neutral external rotation. I then found the subscapularis to be very redundant and I reconstituted it overlapping the previous stump for approximately 1 cm with multiple interrupted #1 Ethibond helping to stabilize the repair  anteriorly. The wound was then irrigated with sterile saline, the self retaining retractors were removed, several minor bleeders were coagulated. The wound was dry on closure. I reapproximated the deltopectoral interval with several interrupted #1  Vicryl sutures. The subcutaneous tissue was closed with 2-0 Vicryl and the skin with running 4-0 nylon. Betadine Adaptic dry sterile dressing and a shoulder immobilizer were applied. She tolerated the procedure well and was taken to the recovery room in satisfactory condition with no known complications. Dictated by:   Illene Labrador. Aplington, M.D. Attending Physician:  Joaquin Courts DD:  10/02/01 TD:  10/03/01 Job: [id number]

## 2010-10-09 NOTE — Assessment & Plan Note (Signed)
[facility]                            CARDIOLOGY OFFICE NOTE   KENNIE, KARAPETIAN                     MRN:          [id number]  DATE:08/24/2006                            DOB:          1922-10-05    Patient was seen on July 14, 2006.  She is now seen for followup.  She has had two brief episodes of palpitations.  She is taking some  extra beta blocker and she does well with this.  She is on her Coumadin.  She mentions that she has had some shortness of breath.  This is worse  [place], doing [place] walking.  She has not had any  chest pain.  There has been no syncope or presyncope.  When we checked  labs with her last visit, her potassium was slightly decreased.  I put  her on potassium and Dr. Lovell Sheehan adjusted the dose downward and she can  have a followup BMET today.   PAST MEDICAL HISTORY:   ALLERGIES:  CODEINE.   MEDICATIONS:  Evista, glimepiride, metformin, metoprolol, Coumadin,  Cardizem, potassium 8 mEq daily, calcium, magnesium, vitamin D,  multivitamin, Micardis/hydrochlorothiazide 20/3.125.   OTHER MEDICAL PROBLEMS:  See the complete list on my note of July 14, 2006.   REVIEW OF SYSTEMS:  She mentions, of course, the episodes of  palpitations and she does mention her shortness of breath.  We do need  to assess this further.  Otherwise, her review of systems is negative.   PHYSICAL EXAM:  Blood pressure is 127/76 with a pulse of 62.  The  patient is oriented to person, place and time and her affect is normal.  Weight is 187 pounds.  HEENT:  Reveals no xanthelasma.  She has normal extraocular motion.  There are no carotid bruits.  There is no jugular venous distention.  CARDIAC EXAM:  Reveals an S1 with an S2.  There are no clicks or  significant murmurs.  ABDOMEN:  Obese, but soft.   She has no peripheral edema.   There are no labs done yet today.  She will have a BMET.   IMPRESSION:  1.  Infrequent episodes of atrial fibrillation with Coumadin on board.      Same therapy.  2. Coumadin therapy.  3. Hyperlipidemia, treated.  4. Diabetes, treated.  5. Normal LV function with aortic valve sclerosis.  6. Status post right femur fracture and left pelvic fracture from a      fall in the past.  7. Some exertional shortness of breath.   We will proceed with a stress Myoview scan and then I will see her back.  BMET will be checked today to be sure her potassium is stable.     Luis Abed, MD, [facility]  Electronically Signed    JDK/MedQ  DD: 08/24/2006  DT: 08/24/2006  Job #: [id number]   cc:   Stacie Glaze, MD

## 2010-10-09 NOTE — Assessment & Plan Note (Signed)
[facility]                            CARDIOLOGY OFFICE NOTE   SERENITIE, VINTON                     MRN:          [id number]  DATE:09/20/2006                            DOB:          02-19-1923    Mr. Mogle is seen for followup.  See my last note of August 24, 2006 and  July 14, 2006.  She had some chest discomfort.  We decided to  proceed with a Myoview scan.  This was normal.  There was no sign of  scar or ischemia.  Her BMET had been checked, and her potassium was 4.9.  BUN was 25 with a creatinine of 0.9.   She returns today, and she is stable.  She is not having much in the way  of palpitations.  She did raise the question of atrial fibrillation,  having read about this.  I explained that she is not symptomatic enough  to be considered for this type of approach.   She is stable today.  The problems are listed on my note of August 24, 2006.  Number 7:  Some exertional shortness of breath and slight chest  discomfort.  I believe that this is not a significant problem with  ischemia.  No further workup at this time.  I will see her back in 6  months.     Luis Abed, MD, [facility]  Electronically Signed    JDK/MedQ  DD: 09/20/2006  DT: 09/20/2006  Job #: [id number]   cc:   Stacie Glaze, MD

## 2010-10-09 NOTE — Op Note (Signed)
[facility]  Patient:    Tara Bradley, Tara Bradley                       MRN: [id number] Proc. Date: 09/23/99 Adm. Date:  [id number] Disc. Date: [id number] Attending:  Janalyn Rouse                           Operative Report  PREOPERATIVE DIAGNOSIS:  Abnormal right breast mammogram with a solid mass.  POSTOPERATIVE DIAGNOSIS:  Abnormal right breast mammogram with a solid mass, pathology pending.  OPERATION:  Right breast biopsy with needle localization and specimen mammography.  SURGEON:  Rose Phi. Maple Hudson, M.D.  ANESTHESIA:  MAC.  DESCRIPTION OF PROCEDURE:  Patient was placed on the operating table and the right breast prepped and draped in usual fashion.  A circumareolar curvilinear incision was outlined around the previously centered wire at the 6 oclock position.  The  area was then infiltrated with 1% Xylocaine with Adrenalin.  A curved incision as made and with sharp dissection, the wire and the surrounding mass were excised.  Hemostasis was obtained with the cautery.  Specimen mammography confirmed the removal of the lesion.  A subcuticular closure of 4-0 Monocryl and Steri-Strips was carried out; dressing applied.  Patient was transferred to recovery room in satisfactory condition, having tolerated the procedure well. DD:  09/23/99 TD:  09/25/99 Job: [id number]

## 2010-10-09 NOTE — Consult Note (Signed)
NAME:  Tara Bradley, FERN NO.:  [id number]   MEDICAL RECORD NO.:  [id number]          PATIENT TYPE:  IPS   LOCATION:  4008                         FACILITY:  [facility]   PHYSICIAN:  Willa Rough, M.D.     DATE OF BIRTH:  Feb 14, 1923   DATE OF CONSULTATION:  03/10/2004  DATE OF DISCHARGE:                                   CONSULTATION   Tara Bradley is a very pleasant 77 year old female who is currently on the  rehab center at [facility].  She fell recently, and on March 02, 2004, had  a fractured femur repaired.  She has been on Lovenox and Coumadin with her  INR increasing.  She developed rapid atrial fibrillation on the evening of  February 28, 2004.  She had an episode of atrial fibrillation in April 2005.  At that time, she was coumadinized, and then spontaneously reverted to sinus  rhythm as an outpatient.  She has no known coronary disease.  She does not  have chest pain or shortness of breath.  This episode occurred after she was  very active on rehab yesterday and after she received two units of blood  because she was anemic with a postop anemia.  She is now seen in  consultation.   PAST MEDICAL HISTORY:   ALLERGIES:  Question of allergy to CODEINE.   MEDICATIONS:  1.  Toprol XL 50.  2.  Actos 30.  3.  Mavik 2 mg.  4.  Coumadin.  5.  Lovenox.  6.  Hydrochlorothiazide.  7.  Colace.  8.  Pain medications.   OTHER MEDICAL PROBLEMS:  See the complete list below.   SOCIAL HISTORY:  The patient lives with her husband in [place] and is  retired. She does not smoke.   FAMILY HISTORY:  Noncontributory.   REVIEW OF SYSTEMS:  She is actually feeling quite well.  Her leg is healing  nicely, and she feels better since she was transfused.  Otherwise her Review  of Systems is negative.   PHYSICAL EXAMINATION:  VITAL SIGNS: Blood pressure 120/76, pulse 100,  temperature 98.3, respirations 20.  GENERAL:  She is quite comfortable.  LUNGS:  Clear.  NECK:   Normal.  HEENT:  Reveals no marked abnormalities.  CARDIAC:  S1 with an S2.  There are no clicks.  There is a soft systolic  murmur.  ABDOMEN:  Benign.  EXTREMITIES:  Her right leg is healing nicely.  There is no significant  peripheral edema.   LABORATORY AND X-RAY DATA:  EKG reveals rapid atrial fibrillation.  She has  decreased R wave in V2.   Chest x-ray revealed some cardiomegaly.   Renal function is normal.  Hemoglobin is now up to 11.4.  INR is 1.7.   PROBLEM:  1.  History of normal left ventricular function by echocardiogram in January      2005.  2.  Hyperlipidemia.  3.  Diabetes.  4.  Status post fall with a femur fracture leading to surgery on March 02, 2004, leading now to a rehabilitation admission.  5.  History of an episode of atrial fibrillation in April 2005 with Coumadin      therapy.  6.  Ongoing Coumadin therapy.  The patient has been on Lovenox for several      days, and Coumadin has been dosed, and her INR is up to 1.7.  7.  Aortic valve sclerosis.  8.  Postoperative anemia that was treated with transfusion of 2 units of      packed red blood cells yesterday.  9.  * Episode of rapid atrial fibrillation starting late in the evening on      March 09, 2004.  Atrial fibrillation persists.  The patient is not      having significant symptoms; however, she does have rapid increase in      heart rate when she gets up.  Her atrial fibrillation will probably      limit her rehab.  Her atrial fibrillation is less than 36 hours.  The      patient is on full-dose Lovenox and close to full-dose Coumadin.      Therefore, I believe it will be safe to proceed with cardioversion on      October 19 if she has to converted back to sinus.  Also, Cardizem will      be used to control her rate.       JK/MEDQ  D:  03/10/2004  T:  03/10/2004  Job:  [id number]   cc:   Stacie Glaze, M.D. [facility]

## 2010-10-09 NOTE — H&P (Signed)
NAME:  Tara Bradley, Tara Bradley NO.:  [id number]   MEDICAL RECORD NO.:  [id number]          PATIENT TYPE:  ORB   LOCATION:  4504                         FACILITY:  [facility]   PHYSICIAN:  Erick Colace, M.D.DATE OF BIRTH:  1922/06/16   DATE OF ADMISSION:  01/05/2005  DATE OF DISCHARGE:                                HISTORY & PHYSICAL   REASON FOR ADMISSION:  Right pubic ramus and acetabular fracture with  decreased ambulation.   HISTORY:  An 77 year old female with history of chronic atrial fibrillation  with chronic Coumadin, admitted to [facility] December 31, 2004, after fall,  no loss of consciousness.  She did, however, sustain a right superior and  inferior pubic ramus fracture and acetabular fracture which was minimally  displaced.  Orthopedics were consulted and advised touchdown weightbearing  with conservative care.  Remains on Coumadin therapy.  Cardiac status is  stable.  She is now admitted for [facility] rehab program.   REVIEW OF SYSTEMS:  Positive for palpitations, reflux, and joint swelling.   PAST MEDICAL HISTORY:  1.  Chronic atrial fibrillation.  2.  Hypertension.  3.  Right femoral shaft fracture in 2005, was at comprehensive inpatient      rehabilitation for this.  4.  Non-insulin-dependent diabetes mellitus.  5.  Hysterectomy.  6.  Breast cancer.   FAMILY HISTORY:  Noncontributory.   SOCIAL HISTORY:  The 26 year old husband in [place] can assist.  Local  son can check.  One-level home once entered.  Negative ETOH, negative  tobacco.   FUNCTIONAL HISTORY:  Prior to admission, independent. Current functional  status moderate assistance with bed mobility, moderate assistance for gait.   HOME MEDICATIONS:  Include Cardizem, Glimipiride, Actos, Toprol,  hydrochlorothiazide, and Coumadin.   Last hemoglobin 11.6,  Last sodium 137, potassium 3.9, BUN 10, creatinine  0.5.   PHYSICAL EXAMINATION:  VITAL SIGNS:  Blood pressure 120/70, pulse  80,  temperature 98, respirations 18.  GENERAL:  Overweight female in no acute distress.  EYES:  Not injected, anicteric.  ENT:  External ENT normal.  Normal hearing.  NECK:  Supple without adenopathy.  LUNGS:  Respiratory good, lungs clear to auscultation.  HEART: Regular rate and rhythm.  No rubs or extra sounds.  EXTREMITIES: Without edema.  ABDOMEN:  Positive bowel sounds, soft, nontender to palpation.  NEUROLOGIC:  Motor strength is 4/4 bilateral deltoids, biceps, triceps,  finger flexors, 2+ right hip flexor, quad, 4 bilateral TA and gastroc, 4-  left hip flexor, quad.  Skin without breakdown.  Cranial nerves II-XII  intact.  Sensation normal.  Orientation x 3.  Mood and affect are normal.   IMPRESSION:  1.  Right superior and inferior pubic ramus fracture with acetabular      fracture causing decreased self care and mobility.  Is touchdown      weightbearing.  2.  Pain management. She has had problems with various pain medications      including allergy to MORPHINE and CODEINE.  She states she does well      with Darvocet. Will initiate Darvocet and increase to Dilaudid if  needed.  3.  Chronic atrial fibrillation.  Continue chronic Coumadin per pharmacy      protocol.  4.  Non-insulin-dependent diabetes mellitus.  Continue Actos and Amaryl.  5.  Hypertension. Continue hydrochlorothiazide, Toprol, and Cardizem.   Estimated length of stay is 10 days.   The patient is good rehab candidate SACU level.      Erick Colace, M.D.  Electronically Signed     AEK/MEDQ  D:  01/05/2005  T:  01/05/2005  Job:  [id number]   cc:   Kerrin Champagne, M.D.  [address]  [place]  [place] [address]  Fax: [phone]   Stacie Glaze, M.D. [facility]  [address] [place]  [place] [address]   Willa Rough, M.D.  [address]  [address]  [place]  [place] [address]

## 2010-10-09 NOTE — Op Note (Signed)
NAME:  Tara Bradley, SCHEPP              ACCOUNT NO.:  [id number]   MEDICAL RECORD NO.:  [id number]          PATIENT TYPE:  INP   LOCATION:  0478                         FACILITY:  [facility]   PHYSICIAN:  Vania Rea. Supple, M.D.  DATE OF BIRTH:  1923/01/15   DATE OF PROCEDURE:  03/05/2004  DATE OF DISCHARGE:                                 OPERATIVE REPORT   PREOPERATIVE DIAGNOSIS:  Right proximal third femoral shaft fracture.   POSTOPERATIVE DIAGNOSIS:  Right proximal third femoral shaft fracture.   PROCEDURE:  A reamed, statically locked intramedullary nailing utilizing a  DePuy 40 cm x 10 mm titanium nail.   SURGEON OF RECORD:  Francena Hanly, M.D.   ASSISTANT:  Ralene Bathe, PA-C.   ANESTHESIA:  General endotracheal.   ESTIMATED BLOOD LOSS:  250 mL.   DRAINS:  None.   HISTORY:  [name] is a 77 year old female, who fell this past Monday  evening while at home, sustaining a displaced proximal third right femoral  shaft fracture.  She was admitted through the emergency room, and plain  radiographs did confirm marked displacement of the fracture.  She  subsequently made it to the floor, where her anticoagulation was reversed  appropriately, and she underwent a routine prep evaluation.  She was  subsequently brought to the operating room at this time for a planned right  femoral intramedullary nailing.   Preoperatively I discussed with [name] treatment options as well as  risks versus benefits thereof.  Possible surgical complications of bleeding,  infection, neurovascular injury, persistence of pain, malunion, nonunion,  loss of fixation, the possible need for additional surgery are all reviewed.  She understands and accepts and agrees with our plan.   PROCEDURE IN DETAIL:  After undergoing appropriate preoperative evaluation,  the patient is transferred to the operating room and on her hospital bed  underwent smooth induction of general endotracheal anesthesia.  She was  then  transferred to the fracture table and appropriately padded and protected.  Right leg was placed in to longitudinal traction, and left leg was placed in  the well leg holder.  Fluoroscopic images were then obtained, and a  reduction maneuver was then performed with a combination of direct  manipulation of the thigh and additional traction.  Ultimately, a good  alignment was achieved with closed means at the fracture site.  The patient  then received prophylactic antibiotics.  The right hip girdle region as well  as right thigh were then sterilely prepped and draped in standard fashion.  An 8 cm longitudinal incision was then made proximal to the tip of the  greater trochanter, with sharp dissection carried down to the skin,  subcutaneous tissue, and deep fascia to gain access to the piriformis fossa.  Utilizing fluoroscopic guidance, a threaded-tip guidepin was then introduced  into the proximal femur, beginning at the piriformis fossa, and proper  positioning was confirmed in both AP and lateral fluoroscopic images.  A  step-drill was then passed over the guidepin.  A ball-tip guidewire was then  directed into the femur and directed distally, safely across the fracture  site with proper positioning confirmed in both AP and lateral fluoroscopic  images.  It was then progressed distally to the metaphyseal flare of the  femur.  Sequential reamings were then performed, beginning with a 9 and  progressing up to an 11.5.  Good corticol contact was achieved.  The ball-  tip guidewire was then switched out for the standard driving guidewire.  A  40 cm x 10 mm femoral nail was then selected after proper measurements had  been obtained.  This was passed over the guidewire and directed across the  fracture site and into the distal femur.  During the final impactions,  traction was completely removed from the leg to allow impaction and  compression at the fracture site.  Once the nail was  impacted to the  appropriate depth, a proximal locking screw was placed utilizing the  outrigger guide and fluoroscopic imaging confirmed proper positioning and  length of the screw, and good bony purchase was achieved.  The guidewire and  outrigger were then removed.  Proper rotation of the extremity was  confirmed.  The fluoroscopic images were then used to place a distal locking  screw with standard technique.  Proper positioning was then confirmed.  Final x-rays were obtained at the fracture site, both proximally and  distally, then confirmed proper positioning of the hardware and good  alignment at the fracture site.  All wounds were then copiously irrigated.  The distal stab wound for the locking screw was closed with a Monocryl and  Steri-Strips.  Proximal wound was closed with #1 Vicryl from the deep fascia  and subcutaneous tissues, 2-0 Vicryl for the subcu, and 3-0 Monocryl  intracuticular for the skin followed by Steri-Strips.  A bulky dry dressing  was then taped about the incisions.  The patient was then removed from the  fracture table, extubated, and taken to recovery room in stable condition.     Sage.Pita   KMS/MEDQ  D:  03/05/2004  T:  03/05/2004  Job:  [id number]

## 2010-10-09 NOTE — Consult Note (Signed)
NAME:  Tara Bradley, Tara Bradley NO.:  [id number]   MEDICAL RECORD NO.:  [id number]                   PATIENT TYPE:  INP   LOCATION:  1825                                 FACILITY:  [facility]   PHYSICIAN:  Tara Bradley, M.D.                 DATE OF BIRTH:  Dec 16, 1922   DATE OF CONSULTATION:  09/05/2003  DATE OF DISCHARGE:                                   CONSULTATION   REFERRING PHYSICIAN:  Dr. Lovell Bradley.   PATIENT IDENTIFICATION:  The patient is a 77 year old who presented to the  [facility] for evaluation of presyncope.   HISTORY OF PRESENT ILLNESS:  The patient has no known cardiac history in the  past.  She was seen back in January by Dr. Willa Bradley (no records  available) for evaluation of shortness of breath.  An echocardiogram was  done, that by her report was normal.  She said she lost about 10 pounds and  felt better.  This morning, she woke up to feel her heart pounding.  Later  on this morning, again felt a little racing, up and down throughout the  morning.  Biggest complaint was dizziness, no syncope, no chest pain, some  shortness of breath.  She presented to [facility] for further  evaluation.   On talking to the patient, she denies any palpitations in the past, again,  intermittent shortness of breath that appears to have gotten better, but  then she had gained some weight, ?slight worsening.   PAST MEDICAL HISTORY:  1. Hypertension for about 10-12 years.  2. Diabetes, diagnosed 8 years ago.  3. History of breast cancer, status post mastectomy with tamoxifen therapy     (left).   ALLERGIES:  None.   MEDICATIONS:  1. Actos 30 mg q day.  2. Glucophage 500 mg b.i.d.  3. Toprol-XL 50 mg daily.  4. Aceon 4 mg daily.  5. Hydrochlorothiazide 12.5 mg daily.  6. Fosamax q.wk.   SOCIAL HISTORY:  The patient lives in [place].  She is married, a retired  Runner, broadcasting/film/video.  She has 2 children, does not smoke, does not drink.   FAMILY  HISTORY:  Family history is significant for mother who died of CHF at  age 44; father died of an MI at age 76.   REVIEW OF SYSTEMS:  Occasional sweats.  Rash on the upper arms.  Presyncope  and palpitations, as noted.  Occasional arthralgias.  Otherwise, all systems  reviewed and negative for the above problem, except as noted.   PHYSICAL EXAM:  GENERAL:  The patient is in no acute distress.  VITAL SIGNS:  Blood pressure 115/75.  Pulse is 90s to 130s and 140 (atrial  fibrillation).  O2 saturation on room air 97%.  NECK:  JVP is normal.  No bruits.  LUNGS:  Lungs clear to auscultation.  CARDIAC:  Irregularly irregular.  S1 and S2.  No S3.  No murmurs.  ABDOMEN:  Exam benign.  No hepatomegaly.  No bruits.  EXTREMITIES:  Good distal pulses.  No lower extremity edema.   LABORATORY AND ACCESSORY CLINICAL DATA:  Twelve-lead EKG shows atrial  fibrillation with ventricular rate of 123 beats per minute.  Nonspecific ST-  T wave changes.   Labs significant for a hemoglobin of 15.6, BUN and creatinine of 24 and 1.1,  potassium of 4.2.   IMPRESSION:  Patient is a 77 year old woman with new-onset atrial  fibrillation.  She was presyncopal today.  Currently rate is not controlled.  On review, the patient's denies palpitation in the past, question if her  shortness of breath is related to this, overall duration unclear.   RECOMMENDATIONS:  I recommend admit to telemetry, heparin, then Coumadin  therapy, rate control with beta blocker.  We will get echocardiogram report  from the hospital, check a TSH.   The patient should have a fasting lipid panel done in the morning, given her  diabetes and hypertension.  Consideration for stress test in the future,  once rate controlled.   Discussed mechanisms of atrial fibrillation and management, plan for  Coumadin therapy, rate control and cardioversion after 3-4 weeks.  If she  cannot be rate-controlled or remains symptomatic, then TEE  cardioversion.                                               Tara Bradley, M.D.    PVR/MEDQ  D:  09/05/2003  T:  09/06/2003  Job:  [id number]   cc:   Dr. Lovell Bradley

## 2010-10-09 NOTE — Discharge Summary (Signed)
NAMESPARROW, SIRACUSA              ACCOUNT NO.:  [id number]   MEDICAL RECORD NO.:  [id number]          PATIENT TYPE:  INP   LOCATION:  5008                         FACILITY:  [facility]   PHYSICIAN:  Kerrin Champagne, M.D.   DATE OF BIRTH:  [birth year]   DATE OF ADMISSION:  12/31/2004  DATE OF DISCHARGE:  01/05/2005                                 DISCHARGE SUMMARY   ADMISSION DIAGNOSES:  1.  Right acetabulum fracture.  2.  Right medial and lateral superior pubic rami fracture and medial      inferior pubic rami fracture.  3.  Coronary artery disease.  4.  Hypertension.  5.  Diabetes.  6.  Atrial fibrillation on chronic Coumadin use.  7.  History of breast cancer status post mastectomy, left.  8.  Osteoporosis.  9.  Status post total intramedullary nailing of the left femur.   DISCHARGE DIAGNOSES:  1.  Right acetabulum fracture.  2.  Right medial and lateral superior pubic rami fracture and medial      inferior pubic rami fracture.  3.  Coronary artery disease.  4.  Hypertension.  5.  Diabetes.  6.  Atrial fibrillation on chronic Coumadin use.  7.  History of breast cancer status post mastectomy, left.  8.  Osteoporosis.  9.  Status post total intramedullary nailing of the left femur.   PROCEDURES:  None.   CONSULTATIONS:  Dr. Debby Bud of [facility] and Dr. Thersa Salt of  Physical Medicine and Rehabilitation.   BRIEF HISTORY:  Tara Bradley is an 77 year old female who fell while  ambulating, landing on her right side. She reports that she merely lost her  balance and fell. She denies any dizziness or loss of consciousness. She was  brought to the emergency room for evaluation where x-rays revealed a right  superior and inferior pubic rami fracture as well as the acetabulum fracture  on the right with minimal displacement. She was having significant  discomfort upon arrival at than the emergency room and did require admission  to the hospital for pain control as well  as physical therapy, occupational  therapy and rehabilitation consult.   BRIEF HOSPITAL COURSE:  Upon admission, the patient was placed at bedrest  and her anticoagulation therapy was continued. The patient was given a Foley  catheter. She was unable to be up and ambulating to the bathroom. A consult  by Dr. Debby Bud was done on admission. The patient was placed on her usual  home medications. He did recommend monitoring H&H and CBC which was done  throughout the hospital stay. Physical therapy was started on her second  hospital day and the patient had quite a bit of discomfort with some  difficulty transferring bed to chair. She was able to tolerate a pivot  transfer initially. The patient was felt to need a prolonged rehabilitation.  A rehabilitation consult was obtained. She was felt to be a suitable  candidate for inpatient rehabilitation and was placed on a bed waiting list  for subacute care unit. Eventually a bed was available at the subacute care  unit and she was  able to be transferred. During the hospital stay, her CBG's  remained at an acceptable range. No adjustments were made in her home  medications. IV analgesics were given initially and she was weaned to p.o.  analgesics. Eventually she was able to have her Foley catheter out. She was  allowed touchdown weightbearing on the right lower extremity. Neurovascular  function motor function of the lower extremities remained intact throughout  the hospital stay.   Pertinent laboratory values on admission blood gases showed bicarbonate 28.1  with remaining values normal. CBC on admission with a WBC 13.6, hemoglobin  15.2, hematocrit 44.4. Repeat CBC on January 01, 2005 showed WBC 8.4,  hemoglobin 13.1, hematocrit 37.1. H&H were monitored daily. Last value on  January 05, 2005 showed hemoglobin 11.6, hematocrit 33.3. Coagulation studies  on admission with PT 24.2, INR 2.2, PTT 49. Chemistry studies on admission  134 for sodium, 157  for glucose with remaining values within normal limits.  Repeat chemistry on January 05, 2005 showed values within normal limits with  exception of glucose 141. Hemoglobin A1c was 5.9. Urinalysis on admission  was negative for urinary tract infection.. Chest x-ray on admission showed  chronic interstitial changes and peribronchial thickening without acute air  space process as well as atherosclerotic vascular disease. Thoracic  degenerative disk disease and scoliosis noted. Pelvis CT showed right  superior and anterior acetabulum fracture nondisplaced, right sacral  fracture with diastasis of the right SI joint, right superior and inferior  pubic rami fractures.   PLAN:  The patient was transferred to subacute care unit. She will continue  to receive physical therapy for ambulation and gait training and is allowed  touchdown weightbearing on the right lower extremity. She will continue on a  diabetic diet. A list of medications were sent with her for continuation.  She will continue on Coumadin for DVT prophylaxis as well as coverage for  her atrial fibrillation. She will follow up with Dr. Otelia Sergeant following her  stay at the rehabilitation center. Questions and concerns regarding her  orthopedic status should be addressed by Dr. Otelia Sergeant by calling [phone]. He  will continue to monitor the patient while on rehabilitation unit.   CONDITION ON DISCHARGE:  Stable.      Wende Neighbors, P.A.      Kerrin Champagne, M.D.  Electronically Signed    SMV/MEDQ  D:  04/14/2005  T:  04/14/2005  Job:  [id number]

## 2010-10-09 NOTE — Discharge Summary (Signed)
NAME:  Tara Bradley, Tara Bradley              ACCOUNT NO.:  [id number]   MEDICAL RECORD NO.:  [id number]          PATIENT TYPE:  INP   LOCATION:  0478                         [facility]ACILITY:  [facility]   PHYSICIAN:  Vania Rea. Supple, M.D.  DATE OF BIRTH:  [birth year]   DATE OF ADMISSION:  03/02/2004  DATE OF DISCHARGE:  03/09/2004                                 DISCHARGE SUMMARY   ADMISSION DIAGNOSES:  1.  Right proximal third femoral shaft fracture.  2.  Recent onset of atrial fibrillation in April.  3.  Hypertension.  4.  Noninsulin-dependent diabetes mellitus.  5.  Osteoporosis.   DISCHARGE DIAGNOSES:  1.  Right proximal third femoral shaft fracture.  2.  Recent onset of atrial fibrillation in April.  3.  Hypertension.  4.  Noninsulin-dependent diabetes mellitus.  5.  Osteoporosis.  6.  Mild postoperative hyponatremia, resolved.  7.  Postoperative anemia requiring transfusion.  8.  Status post IM nail, right femur.   OPERATION:  Status post IM nail, right femur.  Surgeon:  Dr. Francena Hanly.  Assistant:  Ralene Bathe.  Under general anesthesia.   CONSULTS:  Dr. Artist Pais with [facility]   BRIEF HISTORY:  [name] is a 77 year old female, who was ambulating in  her home and tripped over some carpet, falling forward, sustaining a  significant right thigh and left injury.  She is unable to ambulate.  Emergency services transported her to [facility] where she was  found to have a displaced proximal third right femur fracture.  Due to  nature of the injury, risks, benefits discussed for surgical fixation and  issues, and agreement was received.  Unfortunately, she was on Coumadin for  recent onset atrial fibrillation; therefore, had to discontinue her  Coumadin, treat with Lovenox, and allow her to drift to more of a normal pro  time and INR for safe surgery.  We did consult medicine, who saw and  provided clearance and followed her along during her hospital stay.   HOSPITAL COURSE:  The patient was admitted and underwent the above-named  procedure and tolerated this well.  The surgery tip placed on hospital day  #2 after her INR normalized.  All appropriate IV antibiotics and analgesics  were provided.  Postoperatively, she was placed back on Coumadin as well as  Lovenox for bridge therapy.  She was allowed weightbearing as tolerated to  the operative extremity.  She did do well except for had a postoperative  hemorrhagic anemia and was symptomatic with a hemoglobin of 8.7.  She had  some dyspnea as well as dizziness when up.  She tolerated 2 units of blood.  Overall, she remained stable postoperatively.  She tolerated short distance  ambulation.  Therapy saw, evaluated and due to elderly husband at home, a  rehab stay was recommended.  She was felt to be an appropriate candidate.  She remained stable and on the date of discharge, she was afebrile.  Vital  signs were stable.  Neurovascularly, she was intact.  Her hemoglobin was  11.3.  Her biggest complaint was of constipation; however, she had had  small  stools as well as flatus.  At this time, she was felt medically and  orthopedically stable to be discharged to [facility].   LABORATORY DATA:  Two units transfusion in chart.  Admission hemoglobin  noted to be 14.  Prior to transfusion, she was 8.7.  Date after this  transfusion of 2 units, she was 11.3.  Chemistries showed normal chemistries  on admission.  She did have a mild hyponatremia at 129 and 133 which  normalized with fluid restriction.  Urinalysis showed trace leukocyte  esterase, and blood type shows O positive.  Radiology showed fluoroscopy use  intraoperatively.  Preoperatively showed a displaced proximal right femoral  diaphyseal fracture.  A chest x-ray showed scoliosis with cardiomegaly.  No  acute disease.  EKG shows normal sinus rhythm.   CONDITION ON DISCHARGE:  Stable and improved.   DISCHARGE PLANNING:  1.  The  patient is being discharged to [facility].  2.  She will continue her medication per the [facility].  She is on chronic      Coumadin therapy that is monitored by [facility].  We had discontinued her      iron at time of discharge and suppositories used for her constipation      currently.  3.  She is weightbearing as tolerated to the operative extremity.  There are      no sutures that need to be removed.  There are Steri-Strips.  Keep clean      and dry dressing on while inpatient.  4.  She may shower at this time postoperatively.  5.  Follow up with [place] after her discharge from rehab; however, we will      continue to follow her there p.r.n.  6.  Continue her regular diabetic diet.   CONDITION ON DISCHARGE:  Stable and improved.     Trac   TAS/MEDQ  D:  03/09/2004  T:  03/09/2004  Job:  [id number]

## 2010-10-09 NOTE — Discharge Summary (Signed)
NAME:  [name], [name] NO.:  [id number]   MEDICAL RECORD NO.:  [id number]                   PATIENT TYPE:  INP   LOCATION:  4705                                 FACILITY:  [facility]   PHYSICIAN:  Pricilla Riffle, M.D.                 DATE OF BIRTH:  1923-01-07   DATE OF ADMISSION:  09/05/2003  DATE OF DISCHARGE:  09/07/2003                           DISCHARGE SUMMARY - REFERRING   HISTORY:  [name] is a 77 year old white female who presented to [facility] for evaluation of presyncope.  She has no known cardiac  history.  However, this morning she woke up and went to feel her heart  pounding.  Later on in the morning she again felt her heart racing that  would go fast, slow down, and go fast again throughout the morning.  She had  associated with this dizziness, but no frank syncope, chest discomfort.  She  did have some shortness of breath, so she presented to [facility] for  further evaluation.  She denies prior history of palpitations, however, she  does have some intermittent shortness of breath, and has been evaluated by  Dr. Myrtis Ser in the past.  Supposedly, an echocardiogram was done at that time  and was reported to be normal and is not available at the time of this  dictation.  She does have at least a 10 year history of hypertension, 8 year  history of diabetes, breast cancer, and status post mastectomy with  Tamoxifen therapy.   LABORATORY DATA:  H&H of 14.9 and 42.6, normal indices, platelets 214.  WBC  6.3.  Sodium 142, potassium 4.2, BUN 24, creatinine 1.1, glucose 183.  CK-MB  and troponin-I x1 were negative for myocardial infarction.  Fasting lipids  showed a total cholesterol of 214, triglycerides 69, HDL 78, LDL 122.  TSH  1.782.  Subsequent hematologies were unremarkable.  EKG showed atrial  fibrillation with a ventricular rate of 123, normal axis, non-specific ST-T  wave changes.   HOSPITAL COURSE:  [name] was  admitted to 4705 and placed on IV heparin  for atrial fibrillation.  Dr. Myrtis Ser felt that she should be anticoagulated.  Thus, Coumadin was started.  It is noted that on the afternoon of September 06, 2003, she did receive Coumadin teaching.  Her beta blocker was increased to  25 mg q.6h.  Overnight, the patient felt much better.  She remained in  atrial fibrillation, but her ventricular rate had slowed to in the 80s.  Dr.  Myrtis Ser, after review, felt that the patient could be discharged home later  that afternoon.   DISCHARGE DIAGNOSES:  1. Near syncope.  2. Atrial fibrillation with rapid ventricular rate.  3. Hyperlipidemia.  4. History as previously described.   DISCHARGE MEDICATIONS:  1. Coumadin 5 mg daily between 4 and 6 p.m.  She will have a PT/INR checked  at the Coumadin Clinic at [facility] on Tuesday, September 10, 2003,     at 10:30 a.m.  2. Her Toprol was increased to 75 mg daily.  3. She was asked to continue her Actos 30 mg daily.  4. Glucophage 500 mg b.i.d.  5. _______________40 mg daily.  6. HCTZ 12.5 mg daily.  7. Fosamax weekly.   DIET:  She was asked to continue a low salt, fat, cholesterol, ADA diet.   FOLLOWUP:  She will follow up with Dr. Myrtis Ser on Sep 23, 2003, at 11:30 a.m.  At the time of Dr. Henrietta Hoover followup, echocardiogram from January 2005, should  be reviewed, as well as acute treatment for her hyperlipidemia.      Joellyn Rued, P.A. [facility]                    Pricilla Riffle, M.D.    EW/MEDQ  D:  09/06/2003  T:  09/07/2003  Job:  [id number]   cc:   Dr. Lovell Sheehan   Coumadin Clinic at [facility]

## 2010-10-12 ENCOUNTER — Encounter: Payer: Self-pay | Admitting: Gastroenterology

## 2010-10-12 ENCOUNTER — Ambulatory Visit (INDEPENDENT_AMBULATORY_CARE_PROVIDER_SITE_OTHER): Payer: Medicare Other | Admitting: Gastroenterology

## 2010-10-12 VITALS — BP 122/60 | HR 60 | Ht 65.0 in | Wt 159.0 lb

## 2010-10-12 DIAGNOSIS — R933 Abnormal findings on diagnostic imaging of other parts of digestive tract: Secondary | ICD-10-CM

## 2010-10-12 DIAGNOSIS — Z7901 Long term (current) use of anticoagulants: Secondary | ICD-10-CM

## 2010-10-12 DIAGNOSIS — K5732 Diverticulitis of large intestine without perforation or abscess without bleeding: Secondary | ICD-10-CM

## 2010-10-12 MED ORDER — PEG-KCL-NACL-NASULF-NA ASC-C 100 G PO SOLR: 1.0000 | Freq: Once | ORAL | Status: AC

## 2010-10-12 NOTE — Patient Instructions (Addendum)
You have been scheduled for a Colonoscopy with separate instructions given. Pick up your prep from your pharmacy.  cc: Darryll Capers, MD

## 2010-10-12 NOTE — Progress Notes (Signed)
History of Present Illness: This is an 77 year old female who was recently diagnosed with diverticulitis. She states she developed left-sided abdominal pain and was seen in the emergency department [facility] in [place], [place] on March 15th. She was placed on a course of Cipro and metronidazole and brings a disc with images from her CT scan. She is currently asymptomatic. She has not previously had diverticulitis. She underwent a screening colonoscopy in February 2001 which showed mild sigmoid colon diverticulosis. The procedure was moderately difficult felt secondary to adhesions from prior hysterectomy. She is maintained on Coumadin for atrial fibrillation which has been stable. She has stable diabetes mellitus, hypertension and a history of breast cancer. She denies abdominal pain, change in bowel habits, melena, hematochezia, weight loss, nausea, vomiting, reflux symptoms, dysphagia.  Past Medical History  Diagnosis Date  . Personal history of malignant neoplasm of breast   . Personal history of other disorders of nervous system and sense organs   . Unspecified closed fracture of pelvis   . Closed fracture of unspecified part of femur   . Encounter for long-term (current) use of anticoagulants   . Atrial fibrillation   . Hyperlipidemia   . Osteoporosis   . Arthritis   . Hypertension   . Diabetes mellitus   . Diverticulosis   . Diverticulitis    Past Surgical History  Procedure Date  . Abdominal hysterectomy 1995  . Shoulder surgery     left  . Mastectomy 1995    left  . Femur surgery   . Wrist surgery      x 2     reports that she has never smoked. She has never used smokeless tobacco. She reports that she does not drink alcohol or use illicit drugs. family history includes Diabetes in her father; Heart attack (age of onset:87) in her father; and Heart failure (age of onset:84) in her mother.  There is no history of Colon cancer. Allergies  Allergen Reactions  .  Codeine   . Codeine Sulfate     REACTION: unspecified     reports that she has never smoked. She has never used smokeless tobacco. She reports that she does not drink alcohol or use illicit drugs. family history includes Diabetes in her father; Heart attack (age of onset:87) in her father; and Heart failure (age of onset:84) in her mother.  There is no history of Colon cancer. Allergies  Allergen Reactions  . Codeine   . Codeine Sulfate     REACTION: unspecified   Outpatient Encounter Prescriptions as of 10/12/2010  Medication Sig Dispense Refill  . ALPRAZolam (XANAX) 0.5 MG tablet Take 0.5 mg by mouth at bedtime as needed.        . Ascorbic Acid (VITAMIN C) 500 MG tablet Take 500 mg by mouth daily.        [name] b complex vitamins tablet Take 1 tablet by mouth daily.        . Calcium Carbonate-Vitamin D (CALTRATE 600+D) 600-400 MG-UNIT per tablet Take 1 tablet by mouth daily.        . Cholecalciferol (VITAMIN D) 2000 UNITS tablet Take 2,000 Units by mouth daily.        . digoxin (LANOXIN) 0.125 MG tablet Take 125 mcg by mouth daily.       [name] diltiazem (CARDIZEM CD) 240 MG 24 hr capsule Take 240 mg by mouth daily.        [name] EVISTA 60 MG tablet TAKE 1 TABLET BY MOUTH EVERY  DAY  90 tablet  1  . glucose blood (TRUETEST TEST) test strip 1 each by Other route 2 (two) times daily. Use as instructed       . losartan-hydrochlorothiazide (HYZAAR) 100-12.5 MG per tablet Take 1 tablet by mouth daily.        . Magnesium 250 MG TABS Take 1 tablet by mouth daily.        . metoprolol (TOPROL-XL) 50 MG 24 hr tablet Take 50 mg by mouth 2 (two) times daily.       . Multiple Vitamin (MULTIVITAMIN) tablet Take 1 tablet by mouth daily.        . pioglitazone (ACTOS) 45 MG tablet Take 45 mg by mouth daily.        . potassium chloride (KLOR-CON) 8 MEQ CR tablet Take 8 mEq by mouth daily.        . rosuvastatin (CRESTOR) 5 MG tablet Take 5 mg by mouth. 3 days a week      . Saxagliptin-Metformin 2.09-998 MG TB24 Take 1  tablet by mouth 2 (two) times daily.        [name] warfarin (COUMADIN) 5 MG tablet Take 5 mg by mouth. As directed      . peg 3350 powder (MOVIPREP) 100 G SOLR Take 1 kit (100 g total) by mouth once.  1 kit  0  . zoledronic acid (RECLAST) 5 MG/100ML SOLN Inject 5 mg into the vein once.          Review of Systems: Back pain and intermittent sleeping problems. Pertinent positive and negative review of systems were noted in the above HPI section. All other review of systems were otherwise negative.   Physical Exam: General: Well developed , well nourished, no acute distress Head: Normocephalic and atraumatic Eyes:  sclerae anicteric, EOMI Ears: Normal auditory acuity Mouth: No deformity or lesDeferred to colonoscopyions Neck: Supple, no masses or thyromegaly Lungs: Clear throughout to auscultation Heart: Regular rate and rhythm; no murmurs, rubs or bruits Abdomen: Soft, non tender and non distended. No masses, hepatosplenomegaly or hernias noted. Normal bowel sounds Rectal: Deferred to colonoscopy Musculoskeletal: Symmetrical with no gross deformities  Skin: No lesions on visible extremities Pulses:  Normal pulses noted Extremities: No clubbing, cyanosis, edema or deformities noted Neurological: Alert oriented x 4, grossly nonfocal Cervical Nodes:  No significant cervical adenopathy Inguinal Nodes: No significant inguinal adenopathy Psychological:  Alert and cooperative. Normal mood and affect  Assessment and Recommendations:  1. Diverticulitis, resolved. Abnormal abdominal/pelvic CT scan likely secondary to diverticulitis. Rule out colorectal neoplasms, underlying inflammatory bowel disease and other diseases. Advised a long-term high fiber diet with adequate daily water intake. The risks, benefits, and alternatives to colonoscopy with possible biopsy and possible polypectomy were discussed with the patient and they consent to proceed.   2. Atrial fibrillation on chronic Coumadin  anticoagulation. The risks, benefits and alternatives to a 5 day hold of Coumadin anticoagulation were discussed with the patient she consents to proceed. Will obtain clearance from Dr. Lovell Sheehan.  2. Diabetes mellitus. Standard protocol for bowel prep and n.p.o. status of the procedure.

## 2010-10-13 ENCOUNTER — Encounter: Payer: Self-pay | Admitting: Gastroenterology

## 2010-10-14 ENCOUNTER — Ambulatory Visit (INDEPENDENT_AMBULATORY_CARE_PROVIDER_SITE_OTHER): Payer: Medicare Other | Admitting: Internal Medicine

## 2010-10-14 ENCOUNTER — Encounter: Payer: Self-pay | Admitting: Internal Medicine

## 2010-10-14 VITALS — BP 146/74 | HR 64 | Temp 98.2°F | Resp 16 | Ht 64.0 in | Wt 159.0 lb

## 2010-10-14 DIAGNOSIS — E785 Hyperlipidemia, unspecified: Secondary | ICD-10-CM

## 2010-10-14 DIAGNOSIS — K5730 Diverticulosis of large intestine without perforation or abscess without bleeding: Secondary | ICD-10-CM

## 2010-10-14 DIAGNOSIS — I10 Essential (primary) hypertension: Secondary | ICD-10-CM

## 2010-10-14 DIAGNOSIS — I4891 Unspecified atrial fibrillation: Secondary | ICD-10-CM

## 2010-10-14 DIAGNOSIS — E119 Type 2 diabetes mellitus without complications: Secondary | ICD-10-CM

## 2010-10-14 LAB — POCT INR: INR: 2

## 2010-10-14 MED ORDER — RALOXIFENE HCL 60 MG PO TABS: 60.0000 mg | ORAL_TABLET | Freq: Every day | ORAL | Status: DC

## 2010-10-14 MED ORDER — SAXAGLIPTIN-METFORMIN ER 2.5-1000 MG PO TB24: 1.0000 | ORAL_TABLET | Freq: Two times a day (BID) | ORAL | Status: DC

## 2010-10-14 NOTE — Patient Instructions (Signed)
Coumadin dose- same, check in 4 weeks

## 2010-10-14 NOTE — Progress Notes (Signed)
Subjective:    Patient ID: Tara Bradley, female    DOB: November 11, 1922, 77 y.o.   MRN: [id number]  HPI  Patient presents for followup visit.  Her blood pressures and good control repeat blood pressure was 140/70 diabetes is in good control with an A1c of 6.2 from her diabetologist her hypertension is stable. The fibrillation is stable and reviewed the results of her cholesterol.  She generally feels well but has been under increased stress of caring for her husband is developing vascular dementia  Review of Systems  Constitutional: Negative for activity change, appetite change and fatigue.  HENT: Negative for ear pain, congestion, neck pain, postnasal drip and sinus pressure.   Eyes: Negative for redness and visual disturbance.  Respiratory: Negative for cough, shortness of breath and wheezing.   Gastrointestinal: Negative for abdominal pain and abdominal distention.  Genitourinary: Negative for dysuria, frequency and menstrual problem.  Musculoskeletal: Negative for myalgias, joint swelling and arthralgias.  Skin: Negative for rash and wound.  Neurological: Negative for dizziness, weakness and headaches.  Hematological: Negative for adenopathy. Does not bruise/bleed easily.  Psychiatric/Behavioral: Negative for sleep disturbance and decreased concentration.   Past Medical History  Diagnosis Date  . Personal history of malignant neoplasm of breast   . Personal history of other disorders of nervous system and sense organs   . Unspecified closed fracture of pelvis   . Closed fracture of unspecified part of femur   . Encounter for long-term (current) use of anticoagulants   . Atrial fibrillation   . Hyperlipidemia   . Osteoporosis   . Arthritis   . Hypertension   . Diabetes mellitus   . Diverticulosis   . Diverticulitis    Past Surgical History  Procedure Date  . Abdominal hysterectomy 1995  . Shoulder surgery     left  . Mastectomy 1995    left  . Femur surgery   . Wrist  surgery      x 2     reports that she has never smoked. She has never used smokeless tobacco. She reports that she does not drink alcohol or use illicit drugs. family history includes Diabetes in her father; Heart attack (age of onset:87) in her father; and Heart failure (age of onset:84) in her mother.  There is no history of Colon cancer. Allergies  Allergen Reactions  . Codeine   . Codeine Sulfate     REACTION: unspecified       Objective:   Physical Exam  Constitutional: She is oriented to person, place, and time. She appears well-developed and well-nourished. No distress.  HENT:  Head: Normocephalic and atraumatic.  Right Ear: External ear normal.  Left Ear: External ear normal.  Nose: Nose normal.  Mouth/Throat: Oropharynx is clear and moist.  Eyes: Conjunctivae and EOM are normal. Pupils are equal, round, and reactive to light.  Neck: Normal range of motion. Neck supple. No JVD present. No tracheal deviation present. No thyromegaly present.  Cardiovascular: Normal heart sounds and intact distal pulses.   No murmur heard.      irregular  Pulmonary/Chest: Effort normal and breath sounds normal. She has no wheezes. She exhibits no tenderness.  Abdominal: Soft. Bowel sounds are normal.  Musculoskeletal: Normal range of motion. She exhibits no edema and no tenderness.  Lymphadenopathy:    She has no cervical adenopathy.  Neurological: She is alert and oriented to person, place, and time. She has normal reflexes. No cranial nerve deficit.  Skin: Skin is warm and dry.  She is not diaphoretic.  Psychiatric: She has a normal mood and affect. Her behavior is normal.          Assessment & Plan:  Stable atrial fibrillation and hypertension.  On anticoagulation with a scheduled colonoscopy and given the approval for her to be off of her Coumadin for 5 days prior to her colonoscopy.  High functioning level for an 77 year old  Diabetes is stable

## 2010-10-14 NOTE — Assessment & Plan Note (Signed)
Last a1c was 6.3 Fasting lipids reviewd

## 2010-10-14 NOTE — Assessment & Plan Note (Signed)
The patient had diverticulitis in April and GI as planned a colonoscopy for this year.  She will have to be off Coumadin 5 days prior to the colonoscopy and aspirin a week prior to the colonoscopy due to the fact that she had diverticulitis and her functional status is high I recommend that she go ahead with the colonoscopy

## 2010-10-15 ENCOUNTER — Ambulatory Visit (HOSPITAL_COMMUNITY): Payer: Medicare Other

## 2010-11-13 ENCOUNTER — Ambulatory Visit (INDEPENDENT_AMBULATORY_CARE_PROVIDER_SITE_OTHER): Payer: Medicare Other

## 2010-11-13 DIAGNOSIS — I4891 Unspecified atrial fibrillation: Secondary | ICD-10-CM

## 2010-11-13 LAB — POCT INR: INR: 2.5

## 2010-11-19 ENCOUNTER — Encounter: Payer: Self-pay | Admitting: Gastroenterology

## 2010-11-19 ENCOUNTER — Ambulatory Visit (AMBULATORY_SURGERY_CENTER): Payer: Medicare Other | Admitting: Gastroenterology

## 2010-11-19 VITALS — BP 194/108 | HR 75 | Temp 97.5°F | Resp 15 | Ht 64.0 in | Wt 159.0 lb

## 2010-11-19 DIAGNOSIS — K5730 Diverticulosis of large intestine without perforation or abscess without bleeding: Secondary | ICD-10-CM

## 2010-11-19 DIAGNOSIS — R933 Abnormal findings on diagnostic imaging of other parts of digestive tract: Secondary | ICD-10-CM

## 2010-11-19 DIAGNOSIS — K5732 Diverticulitis of large intestine without perforation or abscess without bleeding: Secondary | ICD-10-CM

## 2010-11-19 LAB — GLUCOSE, CAPILLARY
Glucose-Capillary: 127 mg/dL — ABNORMAL HIGH (ref 70–99)
Glucose-Capillary: 120 mg/dL — ABNORMAL HIGH (ref 70–99)

## 2010-11-19 MED ORDER — SODIUM CHLORIDE 0.9 % IV SOLN: 500.0000 mL | INTRAVENOUS | Status: DC

## 2010-11-19 NOTE — Patient Instructions (Signed)
Follow Discharge instructions (blue & green sheets)  Information on diverticulosis given & info. On high fiber diet given   Resume Coumadin today per order Dr. Russella Dar.

## 2010-11-20 ENCOUNTER — Telehealth: Payer: Self-pay

## 2010-11-20 NOTE — Telephone Encounter (Signed)

## 2010-12-21 ENCOUNTER — Ambulatory Visit: Payer: Medicare Other

## 2010-12-21 ENCOUNTER — Other Ambulatory Visit (INDEPENDENT_AMBULATORY_CARE_PROVIDER_SITE_OTHER): Payer: Medicare Other | Admitting: Internal Medicine

## 2010-12-21 DIAGNOSIS — I4891 Unspecified atrial fibrillation: Secondary | ICD-10-CM

## 2010-12-21 LAB — POCT INR: INR: 1.9

## 2010-12-21 NOTE — Patient Instructions (Signed)
Same dose  Check in 4 weeks  

## 2011-01-18 ENCOUNTER — Ambulatory Visit (INDEPENDENT_AMBULATORY_CARE_PROVIDER_SITE_OTHER): Payer: Medicare Other | Admitting: Internal Medicine

## 2011-01-18 DIAGNOSIS — I4891 Unspecified atrial fibrillation: Secondary | ICD-10-CM

## 2011-01-18 LAB — POCT INR: INR: 1.9

## 2011-01-18 NOTE — Patient Instructions (Signed)
same dose 7.5mg  ,10 mg alternate, check in 4 weeks

## 2011-02-16 ENCOUNTER — Encounter: Payer: Self-pay | Admitting: Internal Medicine

## 2011-02-16 ENCOUNTER — Ambulatory Visit (INDEPENDENT_AMBULATORY_CARE_PROVIDER_SITE_OTHER): Payer: Medicare Other | Admitting: Internal Medicine

## 2011-02-16 VITALS — BP 130/60 | HR 72 | Temp 98.2°F | Resp 16 | Ht 64.0 in | Wt 154.0 lb

## 2011-02-16 DIAGNOSIS — F439 Reaction to severe stress, unspecified: Secondary | ICD-10-CM

## 2011-02-16 DIAGNOSIS — Z733 Stress, not elsewhere classified: Secondary | ICD-10-CM

## 2011-02-16 DIAGNOSIS — I4891 Unspecified atrial fibrillation: Secondary | ICD-10-CM

## 2011-02-16 DIAGNOSIS — I10 Essential (primary) hypertension: Secondary | ICD-10-CM

## 2011-02-16 LAB — POCT INR: INR: 2.5

## 2011-02-16 MED ORDER — ALPRAZOLAM 0.5 MG PO TABS: 0.5000 mg | ORAL_TABLET | Freq: Every evening | ORAL | Status: DC | PRN

## 2011-02-16 NOTE — Progress Notes (Signed)
Addended by: Rita Ohara R on: 02/16/2011 12:21 PM   Modules accepted: Orders

## 2011-02-16 NOTE — Patient Instructions (Addendum)
Reviewed "dealling with spouses with Alzheimer"    Latest dosing instructions   Total Sun Mon Tue Wed Thu Fri Sat   85 10 mg 15 mg 10 mg 15 mg 10 mg 15 mg 10 mg    (10 mg1) (10 mg1.5) (10 mg1) (10 mg1.5) (10 mg1) (10 mg1.5) (10 mg1)

## 2011-02-16 NOTE — Progress Notes (Signed)
Subjective:    Patient ID: Tara Bradley, female    DOB: Oct 09, 1922, 77 y.o.   MRN: [id number]  HPI Follow up of  HTN and AFib Lipids stable GERD stable Increased stress with her husbands memory disorder    Review of Systems  Constitutional: Negative for activity change, appetite change and fatigue.  HENT: Negative for ear pain, congestion, neck pain, postnasal drip and sinus pressure.   Eyes: Negative for redness and visual disturbance.  Respiratory: Negative for cough, shortness of breath and wheezing.   Gastrointestinal: Negative for abdominal pain and abdominal distention.  Genitourinary: Negative for dysuria, frequency and menstrual problem.  Musculoskeletal: Negative for myalgias, joint swelling and arthralgias.  Skin: Negative for rash and wound.  Neurological: Negative for dizziness, weakness and headaches.  Hematological: Negative for adenopathy. Does not bruise/bleed easily.  Psychiatric/Behavioral: Negative for sleep disturbance and decreased concentration.   Past Medical History  Diagnosis Date  . Personal history of malignant neoplasm of breast   . Personal history of other disorders of nervous system and sense organs   . Unspecified closed fracture of pelvis   . Closed fracture of unspecified part of femur   . Encounter for long-term (current) use of anticoagulants   . Atrial fibrillation   . Hyperlipidemia   . Osteoporosis   . Arthritis   . Hypertension   . Diabetes mellitus   . Diverticulosis   . Diverticulitis    Past Surgical History  Procedure Date  . Abdominal hysterectomy 1995  . Shoulder surgery     left  . Mastectomy 1995    left  . Femur surgery   . Wrist surgery      x 2     reports that she has never smoked. She has never used smokeless tobacco. She reports that she does not drink alcohol or use illicit drugs. family history includes Diabetes in her father; Heart attack (age of onset:87) in her father; and Heart failure (age of  onset:84) in her mother.  There is no history of Colon cancer. Allergies  Allergen Reactions  . Codeine   . Codeine Sulfate     REACTION: unspecified       Objective:   Physical Exam  Nursing note and vitals reviewed. Constitutional: She is oriented to person, place, and time. She appears well-developed and well-nourished. No distress.  HENT:  Head: Normocephalic and atraumatic.  Right Ear: External ear normal.  Left Ear: External ear normal.  Nose: Nose normal.  Mouth/Throat: Oropharynx is clear and moist.  Eyes: Conjunctivae and EOM are normal. Pupils are equal, round, and reactive to light.  Neck: Normal range of motion. Neck supple. No JVD present. No tracheal deviation present. No thyromegaly present.  Cardiovascular: Normal rate, regular rhythm, normal heart sounds and intact distal pulses.   No murmur heard. Pulmonary/Chest: Effort normal and breath sounds normal. She has no wheezes. She exhibits no tenderness.  Abdominal: Soft. Bowel sounds are normal.  Musculoskeletal: Normal range of motion. She exhibits no edema and no tenderness.  Lymphadenopathy:    She has no cervical adenopathy.  Neurological: She is alert and oriented to person, place, and time. She has normal reflexes. No cranial nerve deficit.  Skin: Skin is warm and dry. She is not diaphoretic.  Psychiatric: She has a normal mood and affect. Her behavior is normal.          Assessment & Plan:  Stress due to husbands memory disorder tearful Reviewed progress and responsibilities Blood pressure stable  HTN stable

## 2011-02-17 ENCOUNTER — Other Ambulatory Visit: Payer: Medicare Other

## 2011-02-17 LAB — CBC
HCT: 42.4
Hemoglobin: 15
MCHC: 35.4
MCV: 91.2
Platelets: 207
RBC: 4.65
RDW: 13.1
WBC: 7

## 2011-02-17 LAB — COMPREHENSIVE METABOLIC PANEL
ALT: 15
AST: 25
Albumin: 3.7
Alkaline Phosphatase: 78
BUN: 23
CO2: 31
Calcium: 9.5
Chloride: 98
Creatinine, Ser: 0.72
GFR calc Af Amer: >60
GFR calc non Af Amer: >60
Glucose, Bld: 166 — ABNORMAL HIGH
Potassium: 4.4
Sodium: 136
Total Bilirubin: 0.5
Total Protein: 6.3

## 2011-02-17 LAB — DIFFERENTIAL
Basophils Absolute: 0
Basophils Relative: 1
Eosinophils Absolute: 0.1
Eosinophils Relative: 2
Lymphocytes Relative: 22
Lymphs Abs: 1.5
Monocytes Absolute: 0.3
Monocytes Relative: 5
Neutro Abs: 4.9
Neutrophils Relative %: 71

## 2011-02-25 LAB — POCT I-STAT, CHEM 8
BUN: 22 mg/dL (ref 6–23)
Calcium, Ion: 1.23 mmol/L (ref 1.12–1.32)
Chloride: 101 mEq/L (ref 96–112)
Creatinine, Ser: 0.8 mg/dL (ref 0.4–1.2)
Glucose, Bld: 266 mg/dL — ABNORMAL HIGH (ref 70–99)
HCT: 43 % (ref 36.0–46.0)
Hemoglobin: 14.6 g/dL (ref 12.0–15.0)
Potassium: 4.6 mEq/L (ref 3.5–5.1)
Sodium: 138 mEq/L (ref 135–145)
TCO2: 29 mmol/L (ref 0–100)

## 2011-03-03 ENCOUNTER — Other Ambulatory Visit: Payer: Self-pay | Admitting: *Deleted

## 2011-03-03 MED ORDER — DILTIAZEM HCL ER COATED BEADS 240 MG PO CP24: 240.0000 mg | ORAL_CAPSULE | Freq: Every day | ORAL | Status: DC

## 2011-03-03 MED ORDER — LOSARTAN POTASSIUM-HCTZ 100-12.5 MG PO TABS: 1.0000 | ORAL_TABLET | Freq: Every day | ORAL | Status: DC

## 2011-03-03 MED ORDER — METOPROLOL SUCCINATE ER 50 MG PO TB24: 50.0000 mg | ORAL_TABLET | Freq: Two times a day (BID) | ORAL | Status: DC

## 2011-03-09 ENCOUNTER — Other Ambulatory Visit: Payer: Self-pay | Admitting: *Deleted

## 2011-03-09 MED ORDER — METOPROLOL TARTRATE 50 MG PO TABS: 50.0000 mg | ORAL_TABLET | Freq: Two times a day (BID) | ORAL | Status: DC

## 2011-03-18 ENCOUNTER — Ambulatory Visit (INDEPENDENT_AMBULATORY_CARE_PROVIDER_SITE_OTHER): Payer: Medicare Other | Admitting: Internal Medicine

## 2011-03-18 DIAGNOSIS — I4891 Unspecified atrial fibrillation: Secondary | ICD-10-CM

## 2011-03-18 LAB — POCT INR: INR: 2

## 2011-03-18 NOTE — Patient Instructions (Signed)
  Latest dosing instructions   Total Glynis Smiles Tue Wed Thu Fri Sat   85 10 mg 15 mg 10 mg 15 mg 10 mg 15 mg 10 mg    (10 mg1) (10 mg1.5) (10 mg1) (10 mg1.5) (10 mg1) (10 mg1.5) (10 mg1)

## 2011-03-19 ENCOUNTER — Ambulatory Visit (INDEPENDENT_AMBULATORY_CARE_PROVIDER_SITE_OTHER): Payer: Medicare Other

## 2011-03-19 ENCOUNTER — Other Ambulatory Visit: Payer: Self-pay | Admitting: *Deleted

## 2011-03-19 DIAGNOSIS — Z23 Encounter for immunization: Secondary | ICD-10-CM

## 2011-03-19 MED ORDER — WARFARIN SODIUM 5 MG PO TABS: 5.0000 mg | ORAL_TABLET | Freq: Every day | ORAL | Status: DC

## 2011-03-19 MED ORDER — POTASSIUM CHLORIDE 8 MEQ PO TBCR: 8.0000 meq | EXTENDED_RELEASE_TABLET | Freq: Every day | ORAL | Status: DC

## 2011-03-22 ENCOUNTER — Encounter: Payer: Self-pay | Admitting: Cardiology

## 2011-03-22 DIAGNOSIS — R079 Chest pain, unspecified: Secondary | ICD-10-CM | POA: Insufficient documentation

## 2011-03-22 DIAGNOSIS — I358 Other nonrheumatic aortic valve disorders: Secondary | ICD-10-CM | POA: Insufficient documentation

## 2011-03-22 DIAGNOSIS — R55 Syncope and collapse: Secondary | ICD-10-CM | POA: Insufficient documentation

## 2011-03-24 ENCOUNTER — Telehealth: Payer: Self-pay | Admitting: Internal Medicine

## 2011-03-24 NOTE — Telephone Encounter (Signed)
Pharmacist has a drug therapy dosage question on pts warfarin tabs. Pls call before 03/26/11.

## 2011-03-25 ENCOUNTER — Ambulatory Visit (INDEPENDENT_AMBULATORY_CARE_PROVIDER_SITE_OTHER): Payer: Medicare Other | Admitting: Cardiology

## 2011-03-25 ENCOUNTER — Encounter: Payer: Self-pay | Admitting: Cardiology

## 2011-03-25 DIAGNOSIS — R001 Bradycardia, unspecified: Secondary | ICD-10-CM | POA: Insufficient documentation

## 2011-03-25 DIAGNOSIS — I498 Other specified cardiac arrhythmias: Secondary | ICD-10-CM

## 2011-03-25 DIAGNOSIS — I359 Nonrheumatic aortic valve disorder, unspecified: Secondary | ICD-10-CM

## 2011-03-25 DIAGNOSIS — I4891 Unspecified atrial fibrillation: Secondary | ICD-10-CM

## 2011-03-25 DIAGNOSIS — I358 Other nonrheumatic aortic valve disorders: Secondary | ICD-10-CM

## 2011-03-25 DIAGNOSIS — R079 Chest pain, unspecified: Secondary | ICD-10-CM

## 2011-03-25 MED ORDER — METOPROLOL TARTRATE 25 MG PO TABS: 25.0000 mg | ORAL_TABLET | Freq: Two times a day (BID) | ORAL | Status: DC

## 2011-03-25 NOTE — Assessment & Plan Note (Signed)
The patient has not had a documented recurrence of atrial fibrillation.  She continues on Coumadin.  No further workup.

## 2011-03-25 NOTE — Assessment & Plan Note (Signed)
Patient has not had any significant chest pain. No further workup. 

## 2011-03-25 NOTE — Progress Notes (Signed)
HPI Patient is seen today for overall cardiology followup.  She is doing well.  I saw her last November, 2011.  She has a history of frequent paroxysmal atrial fibrillation.  She is on warfarin anticoagulation.  She has not had any need or palpitations.  She does note that she thinks that she feels fatigued sometimes related to the slow heart rate.  She is bradycardic today.  There has been no syncope or presyncope.  As part of today's evaluation I have reviewed my old records.  I have carefully updated new EMR Allergies  Allergen Reactions  . Codeine   . Codeine Sulfate     REACTION: unspecified    Current Outpatient Prescriptions  Medication Sig Dispense Refill  . ALPRAZolam (XANAX) 0.5 MG tablet Take 1 tablet (0.5 mg total) by mouth at bedtime as needed.  30 tablet  3  . Ascorbic Acid (VITAMIN C) 500 MG tablet Take 500 mg by mouth daily.        [name] b complex vitamins tablet Take 1 tablet by mouth daily.        . Calcium Carbonate-Vitamin D (CALTRATE 600+D) 600-400 MG-UNIT per tablet Take 1 tablet by mouth daily.        . Cholecalciferol (VITAMIN D) 2000 UNITS tablet Take 2,000 Units by mouth daily.        . digoxin (LANOXIN) 0.125 MG tablet Take 125 mcg by mouth daily.       [name] diltiazem (CARDIZEM CD) 240 MG 24 hr capsule Take 1 capsule (240 mg total) by mouth daily.  90 capsule  3  . glucose blood (TRUETEST TEST) test strip 1 each by Other route 2 (two) times daily. Use as instructed       . losartan-hydrochlorothiazide (HYZAAR) 100-12.5 MG per tablet Take 1 tablet by mouth daily.  90 tablet  3  . Magnesium 250 MG TABS Take 1 tablet by mouth daily.        . metoprolol (LOPRESSOR) 50 MG tablet Take 1 tablet (50 mg total) by mouth 2 (two) times daily.  180 tablet  3  . Multiple Vitamin (MULTIVITAMIN) tablet Take 1 tablet by mouth daily.        . pioglitazone (ACTOS) 45 MG tablet Take 45 mg by mouth daily.        . potassium chloride (KLOR-CON) 8 MEQ CR tablet Take 1 tablet (8 mEq total) by  mouth daily.  90 tablet  3  . raloxifene (EVISTA) 60 MG tablet Take 1 tablet (60 mg total) by mouth daily.  90 tablet  3  . rosuvastatin (CRESTOR) 5 MG tablet Take 5 mg by mouth. 3 days a week      . Saxagliptin-Metformin 2.09-998 MG TB24 Take 1 tablet by mouth 2 (two) times daily.  180 tablet  3  . warfarin (COUMADIN) 5 MG tablet Take 1 tablet (5 mg total) by mouth daily. As directed  100 tablet  3   Current Facility-Administered Medications  Medication Dose Route Frequency Provider Last Rate Last Dose  . DISCONTD: 0.9 %  sodium chloride infusion  500 mL Intravenous Continuous Meryl Dare, MD,FACG        History   Social History  . Marital Status: Married    Spouse Name: N/A    Number of Children: 2  . Years of Education: N/A   Occupational History  . Piano Teacher    Social History Main Topics  . Smoking status: Never Smoker   . Smokeless tobacco: Never  Used  . Alcohol Use: No  . Drug Use: No  . Sexually Active: Not Currently   Other Topics Concern  . Not on file   Social History Narrative  . No narrative on file    Family History  Problem Relation Age of Onset  . Heart failure Mother 46  . Heart attack Father 65  . Diabetes Father   . Colon cancer Neg Hx     Past Medical History  Diagnosis Date  . Personal history of malignant neoplasm of breast   . Personal history of other disorders of nervous system and sense organs   . Unspecified closed fracture of pelvis   . Closed fracture of unspecified part of femur   . Encounter for long-term (current) use of anticoagulants   . Atrial fibrillation     Paroxysmal, infrequent, Coumadin  . Hyperlipidemia   . Osteoporosis   . Arthritis   . Hypertension   . Diabetes mellitus   . Diverticulosis   . Diverticulitis   . Syncope   . Chest pain     Nuclear, April, 2008, no ischemia,  . Ejection fraction     EF 60%, echo, February, 2008  . Aortic valve sclerosis     Echo, 2008  . Warfarin anticoagulation      Past Surgical History  Procedure Date  . Abdominal hysterectomy 1995  . Shoulder surgery     left  . Mastectomy 1995    left  . Femur surgery   . Wrist surgery      x 2     ROS  Patient denies fever, chills, headache, sweats, rash, change in vision, change in hearing, chest pain, cough, nausea vomiting, urinary symptoms or he all of the systems are reviewed and are negative. PHYSICAL EXAM Patient is oriented to person time and place.  Affect is normal.  She is here with her husband.  Head is atraumatic.  There is a soft radiated murmur in the neck.  There is no jugular venous distention.  Lungs are clear.  Respiratory effort is nonlabored.  Cardiac exam reveals S1-S2.  There are no clicks.  There is a crescendo decrescendo systolic murmur.  The abdomen is soft.  There is no peripheral edema.  There are no musculoskeletal deformities.  No skin rashes. Filed Vitals:   03/25/11 1104  BP: 120/64  Pulse: 46  Height: 5\' 4"  (1.626 m)  Weight: 156 lb (70.761 kg)    EKG is done today and reviewed by me.  She does have significant sinus bradycardia. There is old and decreased anterior R wave progression.  There is no significant change.  ASSESSMENT & PLAN

## 2011-03-25 NOTE — Patient Instructions (Signed)
Your physician wants you to follow-up in: 12 months You will receive a reminder letter in the mail two months in advance. If you don't receive a letter, please call our office to schedule the follow-up appointment.  Your physician has recommended you make the following change in your medication:  Decrease your metoprolol to 25mg  twice daily  Your physician has requested that you have an echocardiogram. Echocardiography is a painless test that uses sound waves to create images of your heart. It provides your doctor with information about the size and shape of your heart and how well your heart's chambers and valves are working. This procedure takes approximately one hour. There are no restrictions for this procedure.  Check your pulse randomly and call me (Debby Anvi Mangal) at [phone] with the results.

## 2011-03-25 NOTE — Assessment & Plan Note (Signed)
The patient is on metoprolol 50 b.i.d.  Her resting heart rate is slow.  Her blood pressures controlled.  She may be having some fatigue from bradycardia.  The dose will be decreased to 25 b.i.d.  She will then check her pulse over time and call us.  If her pulse is not come up into at least 60 on a regular basis I will probably cut her beta blocker back further.

## 2011-03-25 NOTE — Assessment & Plan Note (Addendum)
We know from 2008 that she has aortic valve sclerosis.  I believe that the murmur is a little milder now.  I think it's unlikely that she has severe aortic stenosis.  However we need to reassess her aortic valve and her LV function to be sure that some of her current fatigue is not related to this as opposed to bradycardia.

## 2011-04-01 ENCOUNTER — Ambulatory Visit (HOSPITAL_COMMUNITY): Payer: Medicare Other | Attending: Cardiology | Admitting: Radiology

## 2011-04-01 ENCOUNTER — Telehealth: Payer: Self-pay

## 2011-04-01 ENCOUNTER — Telehealth: Payer: Self-pay | Admitting: Cardiology

## 2011-04-01 DIAGNOSIS — E785 Hyperlipidemia, unspecified: Secondary | ICD-10-CM | POA: Insufficient documentation

## 2011-04-01 DIAGNOSIS — I379 Nonrheumatic pulmonary valve disorder, unspecified: Secondary | ICD-10-CM | POA: Insufficient documentation

## 2011-04-01 DIAGNOSIS — I079 Rheumatic tricuspid valve disease, unspecified: Secondary | ICD-10-CM | POA: Insufficient documentation

## 2011-04-01 DIAGNOSIS — I358 Other nonrheumatic aortic valve disorders: Secondary | ICD-10-CM

## 2011-04-01 DIAGNOSIS — I10 Essential (primary) hypertension: Secondary | ICD-10-CM | POA: Insufficient documentation

## 2011-04-01 DIAGNOSIS — R011 Cardiac murmur, unspecified: Secondary | ICD-10-CM

## 2011-04-01 DIAGNOSIS — R5381 Other malaise: Secondary | ICD-10-CM | POA: Insufficient documentation

## 2011-04-01 NOTE — Telephone Encounter (Signed)
Pt.notified

## 2011-04-01 NOTE — Telephone Encounter (Signed)
Walk In Pt Form "Pt Left Copy of Pulse readings" sent to Message Nurse  04/01/11/km

## 2011-04-01 NOTE — Telephone Encounter (Signed)
Pt dropped off pulse readings.  They are as follows: 61 (7:00pm), 53 (8:30am). 85 (11:30am), 61 (9:30pm), 50 (6:30pm), 59 (1:30pm), 64 (3:30pm).

## 2011-04-01 NOTE — Telephone Encounter (Signed)
These rates are good. Continue the med dose that we changed her to.

## 2011-04-19 ENCOUNTER — Ambulatory Visit (INDEPENDENT_AMBULATORY_CARE_PROVIDER_SITE_OTHER): Payer: Medicare Other | Admitting: Internal Medicine

## 2011-04-19 DIAGNOSIS — I4891 Unspecified atrial fibrillation: Secondary | ICD-10-CM

## 2011-04-19 LAB — POCT INR: INR: 2.6

## 2011-04-19 NOTE — Patient Instructions (Signed)
  Latest dosing instructions   Total Sun Mon Tue Wed Thu Fri Sat   85 10 mg 15 mg 10 mg 15 mg 10 mg 15 mg 10 mg    (10 mg1) (10 mg1.5) (10 mg1) (10 mg1.5) (10 mg1) (10 mg1.5) (10 mg1)        

## 2011-05-19 ENCOUNTER — Ambulatory Visit (INDEPENDENT_AMBULATORY_CARE_PROVIDER_SITE_OTHER): Payer: Medicare Other

## 2011-05-19 DIAGNOSIS — I4891 Unspecified atrial fibrillation: Secondary | ICD-10-CM

## 2011-05-19 LAB — POCT INR: INR: 2.4

## 2011-05-19 NOTE — Patient Instructions (Signed)
  Latest dosing instructions   Total Sun Mon Tue Wed Thu Fri Sat   85 10 mg 15 mg 10 mg 15 mg 10 mg 15 mg 10 mg    (10 mg1) (10 mg1.5) (10 mg1) (10 mg1.5) (10 mg1) (10 mg1.5) (10 mg1)        

## 2011-06-15 ENCOUNTER — Telehealth: Payer: Self-pay | Admitting: Internal Medicine

## 2011-06-15 MED ORDER — POTASSIUM CHLORIDE ER 8 MEQ PO TBCR: 8.0000 meq | EXTENDED_RELEASE_TABLET | Freq: Every day | ORAL | Status: DC

## 2011-06-15 MED ORDER — WARFARIN SODIUM 5 MG PO TABS: 5.0000 mg | ORAL_TABLET | Freq: Every day | ORAL | Status: DC

## 2011-06-15 MED ORDER — DIGOXIN 125 MCG PO TABS: 125.0000 ug | ORAL_TABLET | Freq: Every day | ORAL | Status: DC

## 2011-06-15 NOTE — Telephone Encounter (Signed)
Please send to new mail order pharmacy ---[facility]---fax-[phone], the following meds: Warfarin 5mg , Klor-Con- 8er tabs, Digoxin .125mg . Thanks.

## 2011-06-18 ENCOUNTER — Other Ambulatory Visit: Payer: Self-pay | Admitting: Cardiology

## 2011-06-18 ENCOUNTER — Ambulatory Visit: Payer: Medicare Other

## 2011-06-18 DIAGNOSIS — I358 Other nonrheumatic aortic valve disorders: Secondary | ICD-10-CM

## 2011-06-18 DIAGNOSIS — I4891 Unspecified atrial fibrillation: Secondary | ICD-10-CM

## 2011-06-18 MED ORDER — METOPROLOL TARTRATE 25 MG PO TABS: 25.0000 mg | ORAL_TABLET | Freq: Two times a day (BID) | ORAL | Status: DC

## 2011-06-18 NOTE — Telephone Encounter (Signed)
Metoprolol needs new rx for change of mg from 50 to 25 , called into [facility]

## 2011-06-21 ENCOUNTER — Ambulatory Visit (INDEPENDENT_AMBULATORY_CARE_PROVIDER_SITE_OTHER): Payer: Medicare Other | Admitting: Internal Medicine

## 2011-06-21 ENCOUNTER — Encounter: Payer: Self-pay | Admitting: Internal Medicine

## 2011-06-21 VITALS — BP 138/78 | HR 60 | Temp 98.0°F | Resp 16 | Ht 64.0 in | Wt 159.0 lb

## 2011-06-21 DIAGNOSIS — I498 Other specified cardiac arrhythmias: Secondary | ICD-10-CM

## 2011-06-21 DIAGNOSIS — I4891 Unspecified atrial fibrillation: Secondary | ICD-10-CM

## 2011-06-21 DIAGNOSIS — E119 Type 2 diabetes mellitus without complications: Secondary | ICD-10-CM

## 2011-06-21 DIAGNOSIS — R001 Bradycardia, unspecified: Secondary | ICD-10-CM

## 2011-06-21 DIAGNOSIS — I10 Essential (primary) hypertension: Secondary | ICD-10-CM

## 2011-06-21 DIAGNOSIS — T887XXA Unspecified adverse effect of drug or medicament, initial encounter: Secondary | ICD-10-CM

## 2011-06-21 DIAGNOSIS — E785 Hyperlipidemia, unspecified: Secondary | ICD-10-CM

## 2011-06-21 LAB — HEPATIC FUNCTION PANEL
ALT: 11 U/L (ref 0–35)
AST: 22 U/L (ref 0–37)
Albumin: 3.7 g/dL (ref 3.5–5.2)
Alkaline Phosphatase: 65 U/L (ref 39–117)
Bilirubin, Direct: 0.1 mg/dL (ref 0.0–0.3)
Total Bilirubin: 0.5 mg/dL (ref 0.3–1.2)
Total Protein: 6.4 g/dL (ref 6.0–8.3)

## 2011-06-21 LAB — LIPID PANEL
Cholesterol: 155 mg/dL (ref 0–200)
HDL: 56.9 mg/dL (ref >39.00)
LDL Cholesterol: 67 mg/dL (ref 0–99)
Total CHOL/HDL Ratio: 3
Triglycerides: 158 mg/dL — ABNORMAL HIGH (ref 0.0–149.0)
VLDL: 31.6 mg/dL (ref 0.0–40.0)

## 2011-06-21 LAB — POCT INR: INR: 1.5

## 2011-06-21 NOTE — Patient Instructions (Addendum)
The patient is instructed to continue all medications as prescribed. Schedule followup with check out clerk upon leaving the clinic    Latest dosing instructions   Total Sun Mon Tue Wed Thu Fri Sat   65 10 mg 10 mg 10 mg 7.5 mg 10 mg 10 mg 7.5 mg    (5 mg2) (5 mg2) (5 mg2) (5 mg1.5) (5 mg2) (5 mg2) (5 mg1.5)

## 2011-06-21 NOTE — Progress Notes (Signed)
Subjective:    Patient ID: Tara Bradley, female    DOB: April 10, 1923, 78 y.o.   MRN: [id number]  HPI  Patient is an 78 year old female who is followed for hypertension atrial fibrillation bradycardia with fatigue or symptomatic bradycardia and adult-onset diabetes  We managed her with a cardiologist Dr. Willa Rough and with Dr. Kyra Searles in endocrinology. She has had blood work done at her endocrinologist office for A1c and she reports it was 6.4.  Recently she saw her cardiologist and her beta blocker was decreased her blood pressure remains in control but her pulse has increased and she reports less fatigue.  Review of Systems  Constitutional: Negative for activity change, appetite change and fatigue.  HENT: Negative for ear pain, congestion, neck pain, postnasal drip and sinus pressure.   Eyes: Negative for redness and visual disturbance.  Respiratory: Negative for cough, shortness of breath and wheezing.   Gastrointestinal: Negative for abdominal pain and abdominal distention.  Genitourinary: Negative for dysuria, frequency and menstrual problem.  Musculoskeletal: Negative for myalgias, joint swelling and arthralgias.  Skin: Negative for rash and wound.  Neurological: Negative for dizziness, weakness and headaches.  Hematological: Negative for adenopathy. Does not bruise/bleed easily.  Psychiatric/Behavioral: Negative for sleep disturbance and decreased concentration.       Past Medical History  Diagnosis Date  . Personal history of malignant neoplasm of breast   . Personal history of other disorders of nervous system and sense organs   . Unspecified closed fracture of pelvis   . Closed fracture of unspecified part of femur   . Encounter for long-term (current) use of anticoagulants   . Atrial fibrillation     Paroxysmal, infrequent, Coumadin  . Hyperlipidemia   . Osteoporosis   . Arthritis   . Hypertension   . Diabetes mellitus   . Diverticulosis   . Diverticulitis     . Syncope   . Chest pain     Nuclear, April, 2008, no ischemia,  . Ejection fraction     EF 60%, echo, February, 2008  . Aortic valve sclerosis     Echo, 2008  . Warfarin anticoagulation   . Bradycardia     October, 2012    History   Social History  . Marital Status: Married    Spouse Name: N/A    Number of Children: 2  . Years of Education: N/A   Occupational History  . Piano Teacher    Social History Main Topics  . Smoking status: Never Smoker   . Smokeless tobacco: Never Used  . Alcohol Use: No  . Drug Use: No  . Sexually Active: Not Currently   Other Topics Concern  . Not on file   Social History Narrative  . No narrative on file    Past Surgical History  Procedure Date  . Abdominal hysterectomy 1995  . Shoulder surgery     left  . Mastectomy 1995    left  . Femur surgery   . Wrist surgery      x 2     Family History  Problem Relation Age of Onset  . Heart failure Mother 43  . Heart attack Father 58  . Diabetes Father   . Colon cancer Neg Hx     Allergies  Allergen Reactions  . Codeine   . Codeine Sulfate     REACTION: unspecified    Current Outpatient Prescriptions on File Prior to Visit  Medication Sig Dispense Refill  . ALPRAZolam (XANAX) 0.5 MG tablet  Take 1 tablet (0.5 mg total) by mouth at bedtime as needed.  30 tablet  3  . Ascorbic Acid (VITAMIN C) 500 MG tablet Take 500 mg by mouth daily.        [name] b complex vitamins tablet Take 1 tablet by mouth daily.        . Calcium Carbonate-Vitamin D (CALTRATE 600+D) 600-400 MG-UNIT per tablet Take 1 tablet by mouth daily.        . Cholecalciferol (VITAMIN D) 2000 UNITS tablet Take 2,000 Units by mouth daily.        . digoxin (LANOXIN) 0.125 MG tablet Take 1 tablet (125 mcg total) by mouth daily.  90 tablet  3  . diltiazem (CARDIZEM CD) 240 MG 24 hr capsule Take 1 capsule (240 mg total) by mouth daily.  90 capsule  3  . glucose blood (TRUETEST TEST) test strip 1 each by Other route 2 (two)  times daily. Use as instructed       . losartan-hydrochlorothiazide (HYZAAR) 100-12.5 MG per tablet Take 1 tablet by mouth daily.  90 tablet  3  . Magnesium 250 MG TABS Take 1 tablet by mouth daily.        . metoprolol tartrate (LOPRESSOR) 25 MG tablet Take 1 tablet (25 mg total) by mouth 2 (two) times daily.  60 tablet  3  . Multiple Vitamin (MULTIVITAMIN) tablet Take 1 tablet by mouth daily.        . pioglitazone (ACTOS) 45 MG tablet Take 45 mg by mouth daily.        . potassium chloride (KLOR-CON) 8 MEQ tablet Take 1 tablet (8 mEq total) by mouth daily.  90 tablet  3  . raloxifene (EVISTA) 60 MG tablet Take 1 tablet (60 mg total) by mouth daily.  90 tablet  3  . rosuvastatin (CRESTOR) 5 MG tablet Take 5 mg by mouth. 3 days a week      . Saxagliptin-Metformin 2.09-998 MG TB24 Take 1 tablet by mouth 2 (two) times daily.  180 tablet  3  . warfarin (COUMADIN) 5 MG tablet Take 1 tablet (5 mg total) by mouth daily. As directed  100 tablet  3    BP 138/78  Pulse 60  Temp 98 F (36.7 C)  Resp 16  Ht 5\' 4"  (1.626 m)  Wt 159 lb (72.122 kg)  BMI 27.29 kg/m2    Objective:   Physical Exam  Nursing note and vitals reviewed. Constitutional: She is oriented to person, place, and time. She appears well-developed and well-nourished. No distress.  HENT:  Head: Normocephalic and atraumatic.  Right Ear: External ear normal.  Left Ear: External ear normal.  Nose: Nose normal.  Mouth/Throat: Oropharynx is clear and moist.  Eyes: Conjunctivae and EOM are normal. Pupils are equal, round, and reactive to light.  Neck: Normal range of motion. Neck supple. No JVD present. No tracheal deviation present. No thyromegaly present.  Cardiovascular: Normal rate, regular rhythm, normal heart sounds and intact distal pulses.   No murmur heard. Pulmonary/Chest: Effort normal and breath sounds normal. She has no wheezes. She exhibits no tenderness.  Abdominal: Soft. Bowel sounds are normal.  Musculoskeletal:  Normal range of motion. She exhibits no edema and no tenderness.  Lymphadenopathy:    She has no cervical adenopathy.  Neurological: She is alert and oriented to person, place, and time. She has normal reflexes. No cranial nerve deficit.  Skin: Skin is warm and dry. She is not diaphoretic.  Psychiatric: She has  a normal mood and affect. Her behavior is normal.          Assessment & Plan:  We discussed insomnia and a half a liter fears that using the Xanax on a daily basis is appropriate especially given the level of anxiety and the role that insomnia placed in fatigue . Pressure stable with the reduction of beta blocker Once he is stable and she is followed by the endocrinologist Rate today is in sinus rhythm and well controlled in the 50-60 range

## 2011-06-21 NOTE — Progress Notes (Signed)
Addended by: Bonnye Fava on: 06/21/2011 11:57 AM   Modules accepted: Orders

## 2011-06-22 LAB — DIGOXIN LEVEL: Digoxin Level: 1.4 ng/mL (ref 0.8–2.0)

## 2011-07-05 ENCOUNTER — Ambulatory Visit (INDEPENDENT_AMBULATORY_CARE_PROVIDER_SITE_OTHER): Payer: Medicare Other

## 2011-07-05 DIAGNOSIS — I4891 Unspecified atrial fibrillation: Secondary | ICD-10-CM

## 2011-07-05 DIAGNOSIS — Z5181 Encounter for therapeutic drug level monitoring: Secondary | ICD-10-CM

## 2011-07-05 DIAGNOSIS — Z7901 Long term (current) use of anticoagulants: Secondary | ICD-10-CM

## 2011-07-05 LAB — POCT INR: INR: 2.2

## 2011-07-05 NOTE — Patient Instructions (Signed)
  Latest dosing instructions   Total Sun Mon Tue Wed Thu Fri Sat   67.5 10 mg 10 mg 10 mg 7.5 mg 10 mg 10 mg 10 mg    (5 mg2) (5 mg2) (5 mg2) (5 mg1.5) (5 mg2) (5 mg2) (5 mg2)

## 2011-07-26 ENCOUNTER — Other Ambulatory Visit (HOSPITAL_COMMUNITY): Payer: Self-pay | Admitting: Oncology

## 2011-07-26 DIAGNOSIS — Z1231 Encounter for screening mammogram for malignant neoplasm of breast: Secondary | ICD-10-CM

## 2011-08-02 ENCOUNTER — Other Ambulatory Visit: Payer: Self-pay | Admitting: *Deleted

## 2011-08-02 ENCOUNTER — Ambulatory Visit (INDEPENDENT_AMBULATORY_CARE_PROVIDER_SITE_OTHER): Payer: Medicare Other | Admitting: Internal Medicine

## 2011-08-02 DIAGNOSIS — I4891 Unspecified atrial fibrillation: Secondary | ICD-10-CM

## 2011-08-02 LAB — POCT INR: INR: 1.6

## 2011-08-02 MED ORDER — RALOXIFENE HCL 60 MG PO TABS: 60.0000 mg | ORAL_TABLET | Freq: Every day | ORAL | Status: DC

## 2011-08-02 MED ORDER — SAXAGLIPTIN-METFORMIN ER 2.5-1000 MG PO TB24: 1.0000 | ORAL_TABLET | Freq: Two times a day (BID) | ORAL | Status: DC

## 2011-08-02 NOTE — Patient Instructions (Signed)
  Latest dosing instructions   Total Sun Mon Tue Wed Thu Fri Sat   65 10 mg 10 mg 10 mg 7.5 mg 10 mg 10 mg 7.5 mg    (5 mg2) (5 mg2) (5 mg2) (5 mg1.5) (5 mg2) (5 mg2) (5 mg1.5)

## 2011-08-10 ENCOUNTER — Other Ambulatory Visit: Payer: Self-pay | Admitting: *Deleted

## 2011-08-10 DIAGNOSIS — I358 Other nonrheumatic aortic valve disorders: Secondary | ICD-10-CM

## 2011-08-10 DIAGNOSIS — I4891 Unspecified atrial fibrillation: Secondary | ICD-10-CM

## 2011-08-10 MED ORDER — METOPROLOL TARTRATE 25 MG PO TABS: 25.0000 mg | ORAL_TABLET | Freq: Two times a day (BID) | ORAL | Status: DC

## 2011-08-12 ENCOUNTER — Telehealth: Payer: Self-pay | Admitting: Internal Medicine

## 2011-08-12 MED ORDER — WARFARIN SODIUM 5 MG PO TABS: ORAL_TABLET | ORAL | Status: DC

## 2011-08-12 NOTE — Telephone Encounter (Signed)
Patient called stating that Dr. Lovell Sheehan increased her warfarin and she has run out. Patient's mail order pharmacy has advised her to have it sent locally as she is completely out. Please send to [facility] [address] per pt. Please assist.

## 2011-08-24 ENCOUNTER — Telehealth: Payer: Self-pay | Admitting: Internal Medicine

## 2011-08-24 MED ORDER — ALPRAZOLAM 0.5 MG PO TABS: 0.5000 mg | ORAL_TABLET | Freq: Every evening | ORAL | Status: DC | PRN

## 2011-08-24 NOTE — Telephone Encounter (Signed)
Patient called stating that her alprazolam rx has expired. Please assist.

## 2011-08-24 NOTE — Telephone Encounter (Signed)
done

## 2011-08-30 ENCOUNTER — Ambulatory Visit (INDEPENDENT_AMBULATORY_CARE_PROVIDER_SITE_OTHER): Payer: Medicare Other | Admitting: Internal Medicine

## 2011-08-30 DIAGNOSIS — I4891 Unspecified atrial fibrillation: Secondary | ICD-10-CM

## 2011-08-30 LAB — POCT INR: INR: 2.4

## 2011-08-30 NOTE — Patient Instructions (Signed)
  Latest dosing instructions   Total Sun Mon Tue Wed Thu Fri Sat   65 10 mg 10 mg 10 mg 7.5 mg 10 mg 10 mg 7.5 mg    (5 mg2) (5 mg2) (5 mg2) (5 mg1.5) (5 mg2) (5 mg2) (5 mg1.5)

## 2011-09-09 ENCOUNTER — Ambulatory Visit
Admission: RE | Admit: 2011-09-09 | Discharge: 2011-09-09 | Disposition: A | Discharge status: Home / Self Care | Payer: Medicare Other | Source: Ambulatory Visit | Attending: Oncology | Admitting: Oncology

## 2011-09-09 DIAGNOSIS — Z1231 Encounter for screening mammogram for malignant neoplasm of breast: Secondary | ICD-10-CM

## 2011-09-13 ENCOUNTER — Other Ambulatory Visit: Payer: Self-pay | Admitting: *Deleted

## 2011-09-13 ENCOUNTER — Telehealth (INDEPENDENT_AMBULATORY_CARE_PROVIDER_SITE_OTHER): Payer: Self-pay | Admitting: General Surgery

## 2011-09-13 MED ORDER — RALOXIFENE HCL 60 MG PO TABS: 60.0000 mg | ORAL_TABLET | Freq: Every day | ORAL | Status: DC

## 2011-09-13 NOTE — Telephone Encounter (Signed)
Message copied by Littie Deeds on Mon Sep 13, 2011  9:12 AM ------      Message from: Caleen Essex III      Created: Fri Sep 10, 2011  3:25 PM       Looks neg

## 2011-09-13 NOTE — Telephone Encounter (Signed)
I spoke with the pt to inform her that her mammogram looks negative

## 2011-09-20 ENCOUNTER — Encounter: Payer: Self-pay | Admitting: Internal Medicine

## 2011-09-20 ENCOUNTER — Ambulatory Visit (INDEPENDENT_AMBULATORY_CARE_PROVIDER_SITE_OTHER): Payer: Medicare Other | Admitting: Internal Medicine

## 2011-09-20 VITALS — BP 140/80 | HR 60 | Temp 98.0°F | Resp 16 | Ht 64.0 in | Wt 154.0 lb

## 2011-09-20 DIAGNOSIS — I10 Essential (primary) hypertension: Secondary | ICD-10-CM

## 2011-09-20 DIAGNOSIS — IMO0001 Reserved for inherently not codable concepts without codable children: Secondary | ICD-10-CM

## 2011-09-20 NOTE — Patient Instructions (Signed)
The patient is instructed to continue all medications as prescribed. Schedule followup with check out clerk upon leaving the clinic  

## 2011-09-20 NOTE — Progress Notes (Signed)
Subjective:    Patient ID: Tara Bradley, female    DOB: [birth year], 78 y.o.   MRN: [id number]  HPI  Patient is an 78 year old female who is followed for multiple medical problems including hypertension hyperlipidemia and adult onset diabetes she is also followed by an endocrinologist.  Had a recent neurologist visit her hemoglobin A1c was stable in the low sixes an incision to cut back on the metformin due to her age and renal function.  She is now on once a day however combined diabetic medication.  I see that he has blood work ordered for July to monitor the effect of this cut back.   Her blood pressure stable her current medications she has lost 5 pounds and this also may contribute to the decision to cut back on her diabetic medications at this time.  She should monitor that if she starts gaining weight monitor his sugars and report this to her  Review of Systems  Constitutional: Negative for activity change, appetite change and fatigue.  HENT: Negative for ear pain, congestion, neck pain, postnasal drip and sinus pressure.   Eyes: Negative for redness and visual disturbance.  Respiratory: Negative for cough, shortness of breath and wheezing.   Gastrointestinal: Negative for abdominal pain and abdominal distention.  Genitourinary: Negative for dysuria, frequency and menstrual problem.  Musculoskeletal: Negative for myalgias, joint swelling and arthralgias.  Skin: Negative for rash and wound.  Neurological: Negative for dizziness, weakness and headaches.  Hematological: Negative for adenopathy. Does not bruise/bleed easily.  Psychiatric/Behavioral: Negative for sleep disturbance and decreased concentration.   Past Medical History  Diagnosis Date  . Personal history of malignant neoplasm of breast   . Personal history of other disorders of nervous system and sense organs   . Unspecified closed fracture of pelvis   . Closed fracture of unspecified part of femur   . Encounter  for long-term (current) use of anticoagulants   . Atrial fibrillation     Paroxysmal, infrequent, Coumadin  . Hyperlipidemia   . Osteoporosis   . Arthritis   . Hypertension   . Diabetes mellitus   . Diverticulosis   . Diverticulitis   . Syncope   . Chest pain     Nuclear, April, 2008, no ischemia,  . Ejection fraction     EF 60%, echo, February, 2008  . Aortic valve sclerosis     Echo, 2008  . Warfarin anticoagulation   . Bradycardia     October, 2012    History   Social History  . Marital Status: Married    Spouse Name: N/A    Number of Children: 2  . Years of Education: N/A   Occupational History  . Piano Teacher    Social History Main Topics  . Smoking status: Never Smoker   . Smokeless tobacco: Never Used  . Alcohol Use: No  . Drug Use: No  . Sexually Active: Not Currently   Other Topics Concern  . Not on file   Social History Narrative  . No narrative on file    Past Surgical History  Procedure Date  . Abdominal hysterectomy 1995  . Shoulder surgery     left  . Mastectomy 1995    left  . Femur surgery   . Wrist surgery      x 2     Family History  Problem Relation Age of Onset  . Heart failure Mother 34  . Heart attack Father 52  . Diabetes Father   .  Colon cancer Neg Hx     Allergies  Allergen Reactions  . Codeine   . Codeine Sulfate     REACTION: unspecified    Current Outpatient Prescriptions on File Prior to Visit  Medication Sig Dispense Refill  . ALPRAZolam (XANAX) 0.5 MG tablet Take 1 tablet (0.5 mg total) by mouth at bedtime as needed.  30 tablet  5  . Ascorbic Acid (VITAMIN C) 500 MG tablet Take 500 mg by mouth daily.        [name] b complex vitamins tablet Take 1 tablet by mouth daily.        . Calcium Carbonate-Vitamin D (CALTRATE 600+D) 600-400 MG-UNIT per tablet Take 1 tablet by mouth daily.        . Cholecalciferol (VITAMIN D) 2000 UNITS tablet Take 2,000 Units by mouth daily.        . digoxin (LANOXIN) 0.125 MG tablet  Take 1 tablet (125 mcg total) by mouth daily.  90 tablet  3  . diltiazem (CARDIZEM CD) 240 MG 24 hr capsule Take 1 capsule (240 mg total) by mouth daily.  90 capsule  3  . glucose blood (TRUETEST TEST) test strip 1 each by Other route 2 (two) times daily. Use as instructed       . losartan-hydrochlorothiazide (HYZAAR) 100-12.5 MG per tablet Take 1 tablet by mouth daily.  90 tablet  3  . Magnesium 250 MG TABS Take 1 tablet by mouth daily.        . metoprolol tartrate (LOPRESSOR) 25 MG tablet Take 1 tablet (25 mg total) by mouth 2 (two) times daily.  60 tablet  8  . Multiple Vitamin (MULTIVITAMIN) tablet Take 1 tablet by mouth daily.        . pioglitazone (ACTOS) 45 MG tablet Take 45 mg by mouth daily.        . potassium chloride (KLOR-CON) 8 MEQ tablet Take 1 tablet (8 mEq total) by mouth daily.  90 tablet  3  . raloxifene (EVISTA) 60 MG tablet Take 1 tablet (60 mg total) by mouth daily.  90 tablet  3  . rosuvastatin (CRESTOR) 5 MG tablet Take 5 mg by mouth. 3 days a week      . warfarin (COUMADIN) 5 MG tablet TAKE 2,2,1.5 AND CONTINUE THE SEQUENCE  100 tablet  3  . DISCONTD: Saxagliptin-Metformin 2.09-998 MG TB24 Take 1 tablet by mouth 2 (two) times daily.  180 tablet  3    BP 140/80  Pulse 60  Temp 98 F (36.7 C)  Resp 16  Ht 5\' 4"  (1.626 m)  Wt 154 lb (69.854 kg)  BMI 26.43 kg/m2        Objective:   Physical Exam  Nursing note and vitals reviewed. Constitutional: She is oriented to person, place, and time. She appears well-developed and well-nourished. No distress.  HENT:  Head: Normocephalic and atraumatic.  Right Ear: External ear normal.  Left Ear: External ear normal.  Nose: Nose normal.  Mouth/Throat: Oropharynx is clear and moist.  Eyes: Conjunctivae and EOM are normal. Pupils are equal, round, and reactive to light.  Neck: Normal range of motion. Neck supple. No JVD present. No tracheal deviation present. No thyromegaly present.  Cardiovascular: Normal rate, regular  rhythm, normal heart sounds and intact distal pulses.   No murmur heard. Pulmonary/Chest: Effort normal and breath sounds normal. She has no wheezes. She exhibits no tenderness.  Abdominal: Soft. Bowel sounds are normal.  Musculoskeletal: Normal range of motion. She exhibits no  edema and no tenderness.  Lymphadenopathy:    She has no cervical adenopathy.  Neurological: She is alert and oriented to person, place, and time. She has normal reflexes. No cranial nerve deficit.  Skin: Skin is warm and dry. She is not diaphoretic.  Psychiatric: She has a normal mood and affect. Her behavior is normal.          Assessment & Plan:  Stable DM followed by endocrine Increased stress weigth loss due to stress HTN stable

## 2011-09-21 ENCOUNTER — Ambulatory Visit (INDEPENDENT_AMBULATORY_CARE_PROVIDER_SITE_OTHER): Payer: Medicare Other | Admitting: General Surgery

## 2011-09-21 ENCOUNTER — Encounter (INDEPENDENT_AMBULATORY_CARE_PROVIDER_SITE_OTHER): Payer: Self-pay | Admitting: General Surgery

## 2011-09-21 VITALS — BP 144/60 | HR 84 | Resp 16 | Ht 64.0 in | Wt 157.6 lb

## 2011-09-21 DIAGNOSIS — Z853 Personal history of malignant neoplasm of breast: Secondary | ICD-10-CM

## 2011-09-21 NOTE — Patient Instructions (Signed)
Continue to do regular self exams 

## 2011-09-27 NOTE — Progress Notes (Signed)
Subjective:     Patient ID: Tara Bradley, female   DOB: Feb 06, 1923, 78 y.o.   MRN: [id number]  HPI The patient is an 78 year old female who is 14 years out from a left mastectomy and sentinel node biopsy for a micrometastasis in the lymph node. She was treated with tamoxifen. She did not receive chemotherapy or radiation. Since her last visit she has been well and has no complaints.  Review of Systems  Constitutional: Negative.   HENT: Negative.   Eyes: Negative.   Respiratory: Negative.   Cardiovascular: Negative.   Gastrointestinal: Negative.   Genitourinary: Negative.   Musculoskeletal: Negative.   Skin: Negative.   Neurological: Negative.   Hematological: Negative.   Psychiatric/Behavioral: Negative.        Objective:   Physical Exam  Constitutional: She is oriented to person, place, and time. She appears well-developed and well-nourished.  HENT:  Head: Normocephalic and atraumatic.  Eyes: Conjunctivae and EOM are normal. Pupils are equal, round, and reactive to light.  Neck: Normal range of motion. Neck supple.  Cardiovascular: Normal rate, regular rhythm and normal heart sounds.   Pulmonary/Chest: Effort normal and breath sounds normal.       There is no palpable mass of the left chest wall. There is no palpable mass in the right breast. There is no palpable axillary supraclavicular or cervical lymphadenopathy  Abdominal: Soft. Bowel sounds are normal. She exhibits no mass. There is no tenderness.  Musculoskeletal: Normal range of motion.  Lymphadenopathy:    She has no cervical adenopathy.  Neurological: She is alert and oriented to person, place, and time.  Skin: Skin is warm and dry.  Psychiatric: She has a normal mood and affect. Her behavior is normal.       Assessment:     14 years status post left mastectomy    Plan:     At this point she is doing very well. She will continue to do regular self exams. We will plan to see her back in one year

## 2011-09-28 ENCOUNTER — Ambulatory Visit (INDEPENDENT_AMBULATORY_CARE_PROVIDER_SITE_OTHER): Payer: Medicare Other

## 2011-09-28 DIAGNOSIS — I4891 Unspecified atrial fibrillation: Secondary | ICD-10-CM

## 2011-09-28 LAB — POCT INR: INR: 1.6

## 2011-09-28 NOTE — Patient Instructions (Signed)
  Latest dosing instructions   Total Sun Mon Tue Wed Thu Fri Sat   65 10 mg 10 mg 10 mg 7.5 mg 10 mg 10 mg 7.5 mg    (5 mg2) (5 mg2) (5 mg2) (5 mg1.5) (5 mg2) (5 mg2) (5 mg1.5)        

## 2011-10-05 ENCOUNTER — Other Ambulatory Visit (HOSPITAL_COMMUNITY): Payer: Medicare Other

## 2011-10-05 ENCOUNTER — Ambulatory Visit (HOSPITAL_COMMUNITY): Payer: Medicare Other

## 2011-10-05 ENCOUNTER — Ambulatory Visit (HOSPITAL_COMMUNITY): Payer: Medicare Other | Admitting: Oncology

## 2011-10-22 ENCOUNTER — Other Ambulatory Visit: Payer: Self-pay | Admitting: Cardiology

## 2011-10-22 ENCOUNTER — Other Ambulatory Visit: Payer: Self-pay | Admitting: *Deleted

## 2011-10-22 DIAGNOSIS — I358 Other nonrheumatic aortic valve disorders: Secondary | ICD-10-CM

## 2011-10-22 DIAGNOSIS — I4891 Unspecified atrial fibrillation: Secondary | ICD-10-CM

## 2011-10-22 MED ORDER — METOPROLOL TARTRATE 25 MG PO TABS: 25.0000 mg | ORAL_TABLET | Freq: Two times a day (BID) | ORAL | Status: DC

## 2011-10-22 MED ORDER — SAXAGLIPTIN-METFORMIN ER 2.5-1000 MG PO TB24: 1.0000 | ORAL_TABLET | Freq: Every day | ORAL | Status: DC

## 2011-10-25 ENCOUNTER — Ambulatory Visit
Admission: RE | Admit: 2011-10-25 | Discharge: 2011-10-25 | Disposition: A | Discharge status: Home / Self Care | Payer: Medicare Other | Source: Ambulatory Visit | Attending: Oncology | Admitting: Oncology

## 2011-10-25 DIAGNOSIS — Z853 Personal history of malignant neoplasm of breast: Secondary | ICD-10-CM

## 2011-10-26 ENCOUNTER — Ambulatory Visit (INDEPENDENT_AMBULATORY_CARE_PROVIDER_SITE_OTHER): Payer: Medicare Other | Admitting: Family

## 2011-10-26 DIAGNOSIS — I4891 Unspecified atrial fibrillation: Secondary | ICD-10-CM

## 2011-10-26 LAB — POCT INR: INR: 2.5

## 2011-10-26 NOTE — Patient Instructions (Signed)
  Latest dosing instructions   Total Sun Mon Tue Wed Thu Fri Sat   65 10 mg 10 mg 10 mg 7.5 mg 10 mg 10 mg 7.5 mg    (5 mg2) (5 mg2) (5 mg2) (5 mg1.5) (5 mg2) (5 mg2) (5 mg1.5)        

## 2011-11-08 ENCOUNTER — Encounter (HOSPITAL_BASED_OUTPATIENT_CLINIC_OR_DEPARTMENT_OTHER): Payer: Medicare Other | Admitting: Oncology

## 2011-11-08 ENCOUNTER — Encounter (HOSPITAL_COMMUNITY): Payer: Medicare Other | Attending: Oncology

## 2011-11-08 ENCOUNTER — Encounter (HOSPITAL_COMMUNITY): Payer: Self-pay | Admitting: Oncology

## 2011-11-08 VITALS — BP 123/60 | HR 54 | Temp 98.3°F | Wt 159.0 lb

## 2011-11-08 DIAGNOSIS — Z853 Personal history of malignant neoplasm of breast: Secondary | ICD-10-CM

## 2011-11-08 DIAGNOSIS — M4120 Other idiopathic scoliosis, site unspecified: Secondary | ICD-10-CM | POA: Insufficient documentation

## 2011-11-08 DIAGNOSIS — E119 Type 2 diabetes mellitus without complications: Secondary | ICD-10-CM | POA: Insufficient documentation

## 2011-11-08 DIAGNOSIS — Z7901 Long term (current) use of anticoagulants: Secondary | ICD-10-CM | POA: Insufficient documentation

## 2011-11-08 DIAGNOSIS — Z09 Encounter for follow-up examination after completed treatment for conditions other than malignant neoplasm: Secondary | ICD-10-CM | POA: Insufficient documentation

## 2011-11-08 DIAGNOSIS — Z170 Estrogen receptor positive status [ER+]: Secondary | ICD-10-CM

## 2011-11-08 DIAGNOSIS — M1710 Unilateral primary osteoarthritis, unspecified knee: Secondary | ICD-10-CM | POA: Insufficient documentation

## 2011-11-08 DIAGNOSIS — M810 Age-related osteoporosis without current pathological fracture: Secondary | ICD-10-CM | POA: Insufficient documentation

## 2011-11-08 DIAGNOSIS — I4891 Unspecified atrial fibrillation: Secondary | ICD-10-CM | POA: Insufficient documentation

## 2011-11-08 DIAGNOSIS — I10 Essential (primary) hypertension: Secondary | ICD-10-CM | POA: Insufficient documentation

## 2011-11-08 DIAGNOSIS — D0590 Unspecified type of carcinoma in situ of unspecified breast: Secondary | ICD-10-CM

## 2011-11-08 LAB — CBC
HCT: 38.8 % (ref 36.0–46.0)
Hemoglobin: 12.8 g/dL (ref 12.0–15.0)
MCH: 30.9 pg (ref 26.0–34.0)
MCHC: 33 g/dL (ref 30.0–36.0)
MCV: 93.7 fL (ref 78.0–100.0)
Platelets: 212 10*3/uL (ref 150–400)
RBC: 4.14 MIL/uL (ref 3.87–5.11)
RDW: 15.2 % (ref 11.5–15.5)
WBC: 6 10*3/uL (ref 4.0–10.5)

## 2011-11-08 LAB — DIFFERENTIAL
Basophils Absolute: 0 10*3/uL (ref 0.0–0.1)
Basophils Relative: 1 % (ref 0–1)
Eosinophils Absolute: 0.2 10*3/uL (ref 0.0–0.7)
Eosinophils Relative: 3 % (ref 0–5)
Lymphocytes Relative: 19 % (ref 12–46)
Lymphs Abs: 1.2 10*3/uL (ref 0.7–4.0)
Monocytes Absolute: 0.7 10*3/uL (ref 0.1–1.0)
Monocytes Relative: 11 % (ref 3–12)
Neutro Abs: 3.9 10*3/uL (ref 1.7–7.7)
Neutrophils Relative %: 66 % (ref 43–77)

## 2011-11-08 LAB — COMPREHENSIVE METABOLIC PANEL
ALT: 8 U/L (ref 0–35)
AST: 18 U/L (ref 0–37)
Albumin: 3.4 g/dL — ABNORMAL LOW (ref 3.5–5.2)
Alkaline Phosphatase: 85 U/L (ref 39–117)
BUN: 34 mg/dL — ABNORMAL HIGH (ref 6–23)
CO2: 29 mEq/L (ref 19–32)
Calcium: 9.9 mg/dL (ref 8.4–10.5)
Chloride: 97 mEq/L (ref 96–112)
Creatinine, Ser: 0.88 mg/dL (ref 0.50–1.10)
GFR calc Af Amer: 67 mL/min — ABNORMAL LOW (ref >90)
GFR calc non Af Amer: 57 mL/min — ABNORMAL LOW (ref >90)
Glucose, Bld: 241 mg/dL — ABNORMAL HIGH (ref 70–99)
Potassium: 4.2 mEq/L (ref 3.5–5.1)
Sodium: 137 mEq/L (ref 135–145)
Total Bilirubin: 0.3 mg/dL (ref 0.3–1.2)
Total Protein: 6.1 g/dL (ref 6.0–8.3)

## 2011-11-08 NOTE — Progress Notes (Signed)
This office note has been dictated.

## 2011-11-08 NOTE — Progress Notes (Signed)
Labs drawn today for cbc/diff,cmp 

## 2011-11-08 NOTE — Progress Notes (Signed)
CC:   Ollen Gross. Vernell Morgans, M.D. Stacie Glaze, MD [facility] Fells. [place], M.D. Luis Abed, MD, [facility]  DIAGNOSES: 1. Stage II adenocarcinoma of the left breast presented with a T1c N1a     M0 cancer status post surgery on 08/27/1997 with a left mastectomy,     1 of 2 sentinel nodes was involved microscopically only and her     cancer is ER positive 81%, PR negative, however Ki67 marker was     1.7%, HER-2/neu was negative and her maximum tumor size was 1.7 cm.     She did not have LVI.  She had some high-grade associated DCIS     (ductal carcinoma in situ) and we treated with tamoxifen for 5     years. 2. Osteoporosis on Evista, calcium and vitamin D having had Zometa for     5 years and since her last bone density she has actually improved     on the Evista, calcium and vitamin D. 3. Degenerative arthritis especially of the right knee. 4. Hypertension. 5. Atrial fibrillation on Coumadin. 6. Diabetes mellitus. 7. Left shoulder surgery May 2003 with a good result. 8. Scoliosis. 9. Multiple fractures in 2005 and 2006 including a right wrist     fracture x2, superior and inferior pubic rami fractures, acetabular     fracture on the right and a femur fracture prior to that and     essentially none since starting her anti-osteoporosis therapy.  Tara Bradley's bone density clearly has improved and she will have another one in 2 years.  REVIEW OF SYSTEMS: Positive really for the arthritis in the right knee which is bothering her and she has seen Dr. Marlowe Kays for this.  Her sugar the other day was 241.  Otherwise her labs were excellent. Dr. Leslie Dales is her diabetes doctor.  From my standpoint her chest wall was clear.  Stable lymph nodes were negative throughout.  The right breast was negative for any masses.  She has small occasional ecchymoses.  They are insignificant.  She has no peripheral edema of the legs or arms.  She has no adenopathy.  Lungs are clear.  Heart shows this  irregularly irregular rhythm but normal rate.  She is doing very well and is getting ready to travel to [place] in a couple months for a granddaughter's wedding so she is prepared for that. She has blood work as I mentioned on June 17 we did it and today except for the sugar it was excellent.  I will send Dr. Leslie Dales a note.  This was postprandial sugar of 241.  Her medications still include Lanoxin, diltiazem, losartan and she takes metoprolol as well.  From the standpoint of her sugar she is really on saxagliptin-metformin 1 tablet by mouth daily, of course she is also on her Coumadin.  So we will see her back in another year.  She will need a bone density 2 years from now but she remains free of disease in my opinion.    ______________________________ Ladona Horns. Mariel Sleet, MD ESN/MEDQ  D:  11/08/2011  T:  11/08/2011  Job:  [id number]

## 2011-11-08 NOTE — Patient Instructions (Signed)
Tara Bradley  [id number] 1922-12-14 Dr. Glenford Peers   [facility]  Discharge Instructions  RECOMMENDATIONS MADE BY THE CONSULTANT AND ANY TEST RESULTS WILL BE SENT TO YOUR REFERRING DOCTOR.   EXAM FINDINGS BY MD TODAY AND SIGNS AND SYMPTOMS TO REPORT TO CLINIC OR PRIMARY MD: Exam findings are good as discussed by Dr. Mariel Sleet.   SPECIAL INSTRUCTIONS/FOLLOW-UP: 1.  Please keep your 1 year appointment to see Dr. Mariel Sleet. 2.  No lab work will be repeated today, as Dr. Deforest Hoyles has already done them.  I acknowledge that I have been informed and understand all the instructions given to me and received a copy. I do not have any more questions at this time, but understand that I may call the [facility] at [facility] at [phone] during business hours should I have any further questions or need assistance in obtaining follow-up care.    __________________________________________  _____________  __________ Signature of Patient or Authorized Representative            Date                   Time    __________________________________________ Nurse's Signature

## 2011-11-09 ENCOUNTER — Ambulatory Visit: Payer: Medicare Other

## 2011-11-11 ENCOUNTER — Ambulatory Visit: Payer: Medicare Other | Attending: Orthopedic Surgery

## 2011-11-11 DIAGNOSIS — M25659 Stiffness of unspecified hip, not elsewhere classified: Secondary | ICD-10-CM | POA: Insufficient documentation

## 2011-11-11 DIAGNOSIS — IMO0001 Reserved for inherently not codable concepts without codable children: Secondary | ICD-10-CM | POA: Insufficient documentation

## 2011-11-11 DIAGNOSIS — R5381 Other malaise: Secondary | ICD-10-CM | POA: Insufficient documentation

## 2011-11-11 DIAGNOSIS — M25569 Pain in unspecified knee: Secondary | ICD-10-CM | POA: Insufficient documentation

## 2011-11-22 ENCOUNTER — Ambulatory Visit: Payer: Medicare Other | Attending: Orthopedic Surgery | Admitting: Physical Therapy

## 2011-11-22 DIAGNOSIS — M25659 Stiffness of unspecified hip, not elsewhere classified: Secondary | ICD-10-CM | POA: Insufficient documentation

## 2011-11-22 DIAGNOSIS — IMO0001 Reserved for inherently not codable concepts without codable children: Secondary | ICD-10-CM | POA: Insufficient documentation

## 2011-11-22 DIAGNOSIS — R5381 Other malaise: Secondary | ICD-10-CM | POA: Insufficient documentation

## 2011-11-22 DIAGNOSIS — M25569 Pain in unspecified knee: Secondary | ICD-10-CM | POA: Insufficient documentation

## 2011-11-23 ENCOUNTER — Ambulatory Visit (INDEPENDENT_AMBULATORY_CARE_PROVIDER_SITE_OTHER): Payer: Medicare Other | Admitting: Family

## 2011-11-23 DIAGNOSIS — I4891 Unspecified atrial fibrillation: Secondary | ICD-10-CM

## 2011-11-23 LAB — POCT INR: INR: 2.7

## 2011-11-23 NOTE — Patient Instructions (Signed)
7.5 mg Wed and Sat, 10 mg every other day. Check in 4 weeks    Latest dosing instructions   Total Sun Mon Tue Wed Thu Fri Sat   65 10 mg 10 mg 10 mg 7.5 mg 10 mg 10 mg 7.5 mg    (5 mg2) (5 mg2) (5 mg2) (5 mg1.5) (5 mg2) (5 mg2) (5 mg1.5)

## 2011-11-29 ENCOUNTER — Encounter: Payer: Self-pay | Admitting: Oncology

## 2011-11-30 ENCOUNTER — Ambulatory Visit: Payer: Medicare Other | Admitting: Physical Therapy

## 2011-12-02 ENCOUNTER — Encounter: Payer: Medicare Other | Admitting: Physical Therapy

## 2011-12-06 ENCOUNTER — Encounter: Payer: Medicare Other | Admitting: Physical Therapy

## 2011-12-09 ENCOUNTER — Encounter: Payer: Medicare Other | Admitting: Physical Therapy

## 2011-12-20 ENCOUNTER — Ambulatory Visit (INDEPENDENT_AMBULATORY_CARE_PROVIDER_SITE_OTHER): Payer: Medicare Other | Admitting: Internal Medicine

## 2011-12-20 ENCOUNTER — Encounter: Payer: Self-pay | Admitting: Internal Medicine

## 2011-12-20 VITALS — BP 126/72 | HR 60 | Temp 98.4°F | Resp 16 | Ht 64.0 in | Wt 162.0 lb

## 2011-12-20 DIAGNOSIS — M1710 Unilateral primary osteoarthritis, unspecified knee: Secondary | ICD-10-CM

## 2011-12-20 DIAGNOSIS — E119 Type 2 diabetes mellitus without complications: Secondary | ICD-10-CM

## 2011-12-20 DIAGNOSIS — M1990 Unspecified osteoarthritis, unspecified site: Secondary | ICD-10-CM

## 2011-12-20 DIAGNOSIS — I10 Essential (primary) hypertension: Secondary | ICD-10-CM

## 2011-12-20 NOTE — Progress Notes (Signed)
Subjective:    Patient ID: Tara Bradley, female    DOB: 24-May-1923, 78 y.o.   MRN: [id number]  HPI  Patient presents for primary complaint today of severe knee pain bilaterally.  She's been to the orthopedist who evaluated her and said she had severe osteoarthritis he did not recommend surgery at this time I recommended physical therapy she attempted physical therapy for 6 weeks and has not had a significant improvement in her knee pain previously she had an injection of steroids in her knee and she had a reaction with marked hyperglycemia and confusion.  She does not wish to have steroid therapy repeated.  She would be a candidate for Synvisc injections we discussed the risks and benefits of Synvisc injections.  We reviewed her diabetes her hemoglobin A1c is 7.0 her blood pressure is stable her atrial fibrillation are stable.  She had blood work done by her  Review of Systems  Constitutional: Negative for activity change, appetite change and fatigue.  HENT: Negative for ear pain, congestion, neck pain, postnasal drip and sinus pressure.   Eyes: Negative for redness and visual disturbance.  Respiratory: Negative for cough, shortness of breath and wheezing.   Gastrointestinal: Negative for abdominal pain and abdominal distention.  Genitourinary: Negative for dysuria, frequency and menstrual problem.  Musculoskeletal: Negative for myalgias, joint swelling and arthralgias.  Skin: Negative for rash and wound.  Neurological: Negative for dizziness, weakness and headaches.  Hematological: Negative for adenopathy. Does not bruise/bleed easily.  Psychiatric/Behavioral: Negative for disturbed wake/sleep cycle and decreased concentration.       Objective:   Physical Exam  Constitutional: She is oriented to person, place, and time. She appears well-developed and well-nourished. No distress.  HENT:  Head: Normocephalic and atraumatic.  Right Ear: External ear normal.  Left Ear: External  ear normal.  Nose: Nose normal.  Mouth/Throat: Oropharynx is clear and moist.  Eyes: Conjunctivae and EOM are normal. Pupils are equal, round, and reactive to light.  Neck: Normal range of motion. Neck supple. No JVD present. No tracheal deviation present. No thyromegaly present.  Cardiovascular: Normal rate, regular rhythm, normal heart sounds and intact distal pulses.   No murmur heard. Pulmonary/Chest: Effort normal and breath sounds normal. She has no wheezes. She exhibits no tenderness.  Abdominal: Soft. Bowel sounds are normal.  Musculoskeletal: Normal range of motion. She exhibits no edema and no tenderness.  Lymphadenopathy:    She has no cervical adenopathy.  Neurological: She is alert and oriented to person, place, and time. She has normal reflexes. No cranial nerve deficit.  Skin: Skin is warm and dry. She is not diaphoretic.  Psychiatric: She has a normal mood and affect. Her behavior is normal.          Assessment & Plan:  Dealing with husband with MID ( multiinfarct dementia) DM stable HTN stable AFib stable  Seeing Dr Altimer and has changes medications  Seeing Dr Leslee Home for knee pain, was sent for therapy Has not has injections since   Had a glucose reaction to steroids  Severe  OA with gate issues  Dear osteoarthritis with inability to have steroid injections in her knees do to side effects seen by orthopedist who agreed that she should have an intervention physical therapy attempted without improvement.  Patient is a candidate for Synvisc injections we'll start a series of Synvisc injections in both knees starting today   Informed consent obtained and the patient's knee was prepped with betadine. Local anesthesia was obtained with  topical spray. Then 2 cc of synvisc  was injected into the joint space of both knees. The patient tolerated the procedure without complications. Post injection care discussed with patient.

## 2011-12-20 NOTE — Patient Instructions (Signed)
You have received a steroid injection into a joint space. It will take up to 48 hours before you notice a difference in the pain in the joint. For the next few hours keep ice on the site of the injection. Do not exert the injected joint for the next 24 hours.  

## 2011-12-23 ENCOUNTER — Telehealth (HOSPITAL_COMMUNITY): Payer: Self-pay | Admitting: *Deleted

## 2011-12-23 NOTE — Telephone Encounter (Signed)
Patient made aware that bone density looked better than it did in June 2011 and to continue the same medication regimen. She said ok.

## 2011-12-24 ENCOUNTER — Ambulatory Visit (INDEPENDENT_AMBULATORY_CARE_PROVIDER_SITE_OTHER): Payer: Medicare Other | Admitting: Family

## 2011-12-24 DIAGNOSIS — I4891 Unspecified atrial fibrillation: Secondary | ICD-10-CM

## 2011-12-24 LAB — POCT INR: INR: 3.6

## 2011-12-24 NOTE — Patient Instructions (Addendum)
Hold dose today only! Eat more greens. 7.5 mg Wed and Sat, 10 mg every other day. Check in 3 weeks    Latest dosing instructions   Total Sun Mon Tue Wed Thu Fri Sat   65 10 mg 10 mg 10 mg 7.5 mg 10 mg 10 mg 7.5 mg    (5 mg2) (5 mg2) (5 mg2) (5 mg1.5) (5 mg2) (5 mg2) (5 mg1.5)

## 2011-12-28 ENCOUNTER — Ambulatory Visit: Payer: Medicare Other | Admitting: Internal Medicine

## 2011-12-28 ENCOUNTER — Encounter: Payer: Self-pay | Admitting: Internal Medicine

## 2011-12-28 ENCOUNTER — Ambulatory Visit (INDEPENDENT_AMBULATORY_CARE_PROVIDER_SITE_OTHER): Payer: Medicare Other | Admitting: Internal Medicine

## 2011-12-28 DIAGNOSIS — M1710 Unilateral primary osteoarthritis, unspecified knee: Secondary | ICD-10-CM

## 2011-12-28 NOTE — Progress Notes (Signed)
  Subjective:    Patient ID: Tara Bradley, female    DOB: 09-Dec-1922, 78 y.o.   MRN: [id number]  HPI  For knee injection  Review of Systems  Constitutional: Negative for activity change, appetite change and fatigue.  HENT: Negative for ear pain, congestion, neck pain, postnasal drip and sinus pressure.   Eyes: Negative for redness and visual disturbance.  Respiratory: Negative for cough, shortness of breath and wheezing.   Gastrointestinal: Negative for abdominal pain and abdominal distention.  Genitourinary: Negative for dysuria, frequency and menstrual problem.  Musculoskeletal: Negative for myalgias, joint swelling and arthralgias.  Skin: Negative for rash and wound.  Neurological: Negative for dizziness, weakness and headaches.  Hematological: Negative for adenopathy. Does not bruise/bleed easily.  Psychiatric/Behavioral: Negative for disturbed wake/sleep cycle and decreased concentration.       Objective:   Physical Exam  Nursing note and vitals reviewed. Constitutional: She is oriented to person, place, and time. She appears well-developed and well-nourished. No distress.  HENT:  Head: Normocephalic and atraumatic.  Right Ear: External ear normal.  Left Ear: External ear normal.  Nose: Nose normal.  Mouth/Throat: Oropharynx is clear and moist.  Eyes: Conjunctivae and EOM are normal. Pupils are equal, round, and reactive to light.  Neck: Normal range of motion. Neck supple. No JVD present. No tracheal deviation present. No thyromegaly present.  Cardiovascular: Normal rate, regular rhythm, normal heart sounds and intact distal pulses.   No murmur heard. Pulmonary/Chest: Effort normal and breath sounds normal. She has no wheezes. She exhibits no tenderness.  Abdominal: Soft. Bowel sounds are normal.  Musculoskeletal: Normal range of motion. She exhibits no edema and no tenderness.  Lymphadenopathy:    She has no cervical adenopathy.  Neurological: She is alert and  oriented to person, place, and time. She has normal reflexes. No cranial nerve deficit.  Skin: Skin is warm and dry. She is not diaphoretic.  Psychiatric: She has a normal mood and affect. Her behavior is normal.          Assessment & Plan:   Informed consent obtained and the patient's knee was prepped with betadine. Local anesthesia was obtained with topical spray. Then  2 cc of synvisc was injectioned into each knee  joint space. The patient tolerated the procedure without complications. Post injection care discussed with patient.

## 2011-12-28 NOTE — Patient Instructions (Signed)
The patient is instructed to continue all medications as prescribed. Schedule followup with check out clerk upon leaving the clinic  

## 2011-12-31 ENCOUNTER — Telehealth: Payer: Self-pay | Admitting: Internal Medicine

## 2011-12-31 NOTE — Telephone Encounter (Signed)
That is fine and you can put her in my 12 noon injection slot,.but dr Lovell Sheehan will be giving  her 2 synvisc  injections

## 2011-12-31 NOTE — Telephone Encounter (Signed)
Pt called and is req to get a injection in her knee on 01/17/12. Pls advise.

## 2012-01-17 ENCOUNTER — Ambulatory Visit (INDEPENDENT_AMBULATORY_CARE_PROVIDER_SITE_OTHER): Payer: Medicare Other | Admitting: Internal Medicine

## 2012-01-17 ENCOUNTER — Encounter: Payer: Self-pay | Admitting: Internal Medicine

## 2012-01-17 ENCOUNTER — Ambulatory Visit (INDEPENDENT_AMBULATORY_CARE_PROVIDER_SITE_OTHER): Payer: Medicare Other | Admitting: Family

## 2012-01-17 DIAGNOSIS — M1710 Unilateral primary osteoarthritis, unspecified knee: Secondary | ICD-10-CM

## 2012-01-17 DIAGNOSIS — I4891 Unspecified atrial fibrillation: Secondary | ICD-10-CM

## 2012-01-17 LAB — POCT INR: INR: 3.9

## 2012-01-17 NOTE — Progress Notes (Signed)
  Subjective:    Patient ID: Tara Bradley, female    DOB: 1922-10-28, 78 y.o.   MRN: [id number]  HPI Patient is an 78 year old female who presents for the third in the series of Synvisc injections for severe degenerative arthritis of her knees she has bilateral degenerative disease   Review of Systems     Objective:   Physical Exam        Assessment & Plan:   Informed consent obtained and the patient's knee was prepped with betadine. Local anesthesia was obtained with topical spray. Then 2 cc of Synvisc was injected into both knees  into the joint space. The patient tolerated the procedure without complications. Post injection care discussed with patient.

## 2012-01-17 NOTE — Patient Instructions (Addendum)
Hold dose today only! Eat more greens. 7.5 mg Monday, Wednesday, Friday. Then 10 mg every other day. Check in 3 weeks   Latest dosing instructions   Total Sun Mon Tue Wed Thu Fri Sat   62.5 10 mg 7.5 mg 10 mg 7.5 mg 10 mg 7.5 mg 10 mg    (5 mg2) (5 mg1.5) (5 mg2) (5 mg1.5) (5 mg2) (5 mg1.5) (5 mg2)

## 2012-01-20 ENCOUNTER — Encounter: Payer: Self-pay | Admitting: Internal Medicine

## 2012-01-20 ENCOUNTER — Ambulatory Visit (INDEPENDENT_AMBULATORY_CARE_PROVIDER_SITE_OTHER): Payer: Medicare Other | Admitting: Internal Medicine

## 2012-01-20 VITALS — BP 162/80 | Temp 98.1°F | Wt 177.0 lb

## 2012-01-20 DIAGNOSIS — T148XXA Other injury of unspecified body region, initial encounter: Secondary | ICD-10-CM | POA: Insufficient documentation

## 2012-01-20 LAB — CBC WITH DIFFERENTIAL/PLATELET
Basophils Absolute: 0 10*3/uL (ref 0.0–0.1)
Basophils Relative: 0.4 % (ref 0.0–3.0)
Eosinophils Absolute: 0.2 10*3/uL (ref 0.0–0.7)
Eosinophils Relative: 3 % (ref 0.0–5.0)
HCT: 36 % (ref 36.0–46.0)
Hemoglobin: 12.1 g/dL (ref 12.0–15.0)
Lymphocytes Relative: 23.4 % (ref 12.0–46.0)
Lymphs Abs: 1.2 10*3/uL (ref 0.7–4.0)
MCHC: 33.7 g/dL (ref 30.0–36.0)
MCV: 92.8 fl (ref 78.0–100.0)
Monocytes Absolute: 0.6 10*3/uL (ref 0.1–1.0)
Monocytes Relative: 11 % (ref 3.0–12.0)
Neutro Abs: 3.2 10*3/uL (ref 1.4–7.7)
Neutrophils Relative %: 62.2 % (ref 43.0–77.0)
Platelets: 180 10*3/uL (ref 150.0–400.0)
RBC: 3.88 Mil/uL (ref 3.87–5.11)
RDW: 14.7 % — ABNORMAL HIGH (ref 11.5–14.6)
WBC: 5.2 10*3/uL (ref 4.5–10.5)

## 2012-01-20 NOTE — Assessment & Plan Note (Signed)
78 year old white female on anticoagulation for atrial fibrillation has new onset bruising in her right and left lower leg.I suggest she hold warfarin for 48 hours. Check CBCD to rule out, thrombocytopenia. Recheck INR next week.  Continue to use cool compress.  Patient advised to call office if symptoms persist or worsen.

## 2012-01-20 NOTE — Progress Notes (Signed)
Subjective:    Patient ID: Tara Bradley, female    DOB: 06/20/1922, 78 y.o.   MRN: [id number]  HPI  78 year old white female with history of breast cancer, atrial fibrillation on anticoagulation complains of bruising behind her right upper leg. Patient reports recent travel to [place]. She came back and was visiting with her friends when one of them noticed large bruise behind her right leg. She does not have any significant pain or tenderness. She noticed bruise on her left lower leg 1-2 weeks ago. Her recent INR was 3.9. Patient advised to hold her warfarin per 24 hours then increase her intake of leafy green vegetables.  She denies any recent use of aspirin-containing products or NSAIDs.  Review of Systems Negative for chest pain or shortness of breath  Past Medical History  Diagnosis Date  . Personal history of malignant neoplasm of breast   . Personal history of other disorders of nervous system and sense organs   . Unspecified closed fracture of pelvis   . Closed fracture of unspecified part of femur   . Encounter for long-term (current) use of anticoagulants   . Atrial fibrillation     Paroxysmal, infrequent, Coumadin  . Hyperlipidemia   . Osteoporosis   . Arthritis   . Hypertension   . Diabetes mellitus   . Diverticulosis   . Diverticulitis   . Syncope   . Chest pain     Nuclear, April, 2008, no ischemia,  . Ejection fraction     EF 60%, echo, February, 2008  . Aortic valve sclerosis     Echo, 2008  . Warfarin anticoagulation   . Bradycardia     October, 2012    History   Social History  . Marital Status: Married    Spouse Name: N/A    Number of Children: 2  . Years of Education: N/A   Occupational History  . Piano Teacher    Social History Main Topics  . Smoking status: Never Smoker   . Smokeless tobacco: Never Used  . Alcohol Use: No  . Drug Use: No  . Sexually Active: Not Currently   Other Topics Concern  . Not on file   Social History  Narrative  . No narrative on file    Past Surgical History  Procedure Date  . Abdominal hysterectomy 1995  . Shoulder surgery     left  . Mastectomy 1995    left  . Femur surgery   . Wrist surgery      x 2     Family History  Problem Relation Age of Onset  . Heart failure Mother 55  . Heart attack Father 42  . Diabetes Father   . Colon cancer Neg Hx     Allergies  Allergen Reactions  . Codeine   . Codeine Sulfate     REACTION: unspecified    Current Outpatient Prescriptions on File Prior to Visit  Medication Sig Dispense Refill  . ALPRAZolam (XANAX) 0.5 MG tablet Take 1 tablet (0.5 mg total) by mouth at bedtime as needed.  30 tablet  5  . Ascorbic Acid (VITAMIN C) 500 MG tablet Take 500 mg by mouth daily.        [name] b complex vitamins tablet Take 1 tablet by mouth daily.        . Calcium Carbonate-Vitamin D (CALTRATE 600+D) 600-400 MG-UNIT per tablet Take 1 tablet by mouth daily.        . Cholecalciferol (VITAMIN D) 2000 UNITS  tablet Take 2,000 Units by mouth daily.        . digoxin (LANOXIN) 0.125 MG tablet Take 1 tablet (125 mcg total) by mouth daily.  90 tablet  3  . diltiazem (CARDIZEM CD) 240 MG 24 hr capsule Take 1 capsule (240 mg total) by mouth daily.  90 capsule  3  . glucose blood (TRUETEST TEST) test strip 1 each by Other route 2 (two) times daily. Use as instructed       . losartan-hydrochlorothiazide (HYZAAR) 100-12.5 MG per tablet Take 1 tablet by mouth daily.  90 tablet  3  . Magnesium 250 MG TABS Take 1 tablet by mouth daily.        . metoprolol tartrate (LOPRESSOR) 25 MG tablet Take 1 tablet (25 mg total) by mouth 2 (two) times daily.  60 tablet  8  . Multiple Vitamin (MULTIVITAMIN) tablet Take 1 tablet by mouth daily.        . pioglitazone (ACTOS) 45 MG tablet Take 45 mg by mouth daily.        . potassium chloride (KLOR-CON) 8 MEQ tablet Take 1 tablet (8 mEq total) by mouth daily.  90 tablet  3  . raloxifene (EVISTA) 60 MG tablet Take 1 tablet (60 mg  total) by mouth daily.  90 tablet  3  . rosuvastatin (CRESTOR) 5 MG tablet Take 5 mg by mouth. 3 days a week      . Saxagliptin-Metformin 2.09-998 MG TB24 Take 1 tablet by mouth daily.  180 tablet  3  . warfarin (COUMADIN) 5 MG tablet TAKE 2,2,1.5 AND CONTINUE THE SEQUENCE  100 tablet  3    BP 162/80  Temp 98.1 F (36.7 C) (Oral)  Wt 177 lb (80.287 kg)       Objective:   Physical Exam  Constitutional: She appears well-developed and well-nourished.  HENT:  Head: Normocephalic and atraumatic.  Cardiovascular: Normal rate, regular rhythm and normal heart sounds.   Pulmonary/Chest: Effort normal and breath sounds normal. She has no wheezes.  Musculoskeletal: She exhibits no edema.  Skin:       Grapefruit size bruise behind right leg (above popliteal fossa).  No firmness or tenderness  Patient has 2 smaller bruises on left anterior lower leg          Assessment & Plan:

## 2012-01-20 NOTE — Patient Instructions (Addendum)
Please return on 01/26/2012 for repeat INR

## 2012-01-26 ENCOUNTER — Ambulatory Visit (INDEPENDENT_AMBULATORY_CARE_PROVIDER_SITE_OTHER): Payer: Medicare Other | Admitting: Family

## 2012-01-26 DIAGNOSIS — I4891 Unspecified atrial fibrillation: Secondary | ICD-10-CM

## 2012-01-26 LAB — POCT INR: INR: 2.2

## 2012-01-26 NOTE — Patient Instructions (Signed)
Eat more greens. 10 mg Monday, Wednesday, Friday. Then 7.5 mg every other day. Check in 2-3 weeks    Latest dosing instructions   Total Sun Mon Tue Wed Thu Fri Sat   60 7.5 mg 10 mg 7.5 mg 10 mg 7.5 mg 10 mg 7.5 mg    (5 mg1.5) (5 mg2) (5 mg1.5) (5 mg2) (5 mg1.5) (5 mg2) (5 mg1.5)

## 2012-02-07 ENCOUNTER — Encounter: Payer: Medicare Other | Admitting: Family

## 2012-02-16 ENCOUNTER — Ambulatory Visit (INDEPENDENT_AMBULATORY_CARE_PROVIDER_SITE_OTHER): Payer: Medicare Other | Admitting: Family Medicine

## 2012-02-16 ENCOUNTER — Encounter: Payer: Self-pay | Admitting: Family Medicine

## 2012-02-16 ENCOUNTER — Encounter (HOSPITAL_COMMUNITY): Payer: Self-pay | Admitting: Emergency Medicine

## 2012-02-16 ENCOUNTER — Other Ambulatory Visit: Payer: Self-pay

## 2012-02-16 ENCOUNTER — Observation Stay (HOSPITAL_COMMUNITY)
Admission: EM | Admit: 2012-02-16 | Discharge: 2012-02-16 | Disposition: A | Discharge status: Home / Self Care | Payer: Medicare Other | Attending: Emergency Medicine | Admitting: Emergency Medicine

## 2012-02-16 ENCOUNTER — Emergency Department (HOSPITAL_COMMUNITY): Payer: Medicare Other

## 2012-02-16 ENCOUNTER — Ambulatory Visit (INDEPENDENT_AMBULATORY_CARE_PROVIDER_SITE_OTHER): Payer: Medicare Other | Admitting: Family

## 2012-02-16 VITALS — BP 98/64 | HR 120 | Temp 97.6°F | Wt 160.0 lb

## 2012-02-16 DIAGNOSIS — I4891 Unspecified atrial fibrillation: Secondary | ICD-10-CM

## 2012-02-16 DIAGNOSIS — M7989 Other specified soft tissue disorders: Secondary | ICD-10-CM | POA: Insufficient documentation

## 2012-02-16 DIAGNOSIS — M79609 Pain in unspecified limb: Secondary | ICD-10-CM

## 2012-02-16 DIAGNOSIS — T148XXA Other injury of unspecified body region, initial encounter: Secondary | ICD-10-CM | POA: Diagnosis present

## 2012-02-16 DIAGNOSIS — E119 Type 2 diabetes mellitus without complications: Secondary | ICD-10-CM | POA: Insufficient documentation

## 2012-02-16 DIAGNOSIS — Z7901 Long term (current) use of anticoagulants: Secondary | ICD-10-CM

## 2012-02-16 DIAGNOSIS — I10 Essential (primary) hypertension: Secondary | ICD-10-CM | POA: Diagnosis present

## 2012-02-16 DIAGNOSIS — I480 Paroxysmal atrial fibrillation: Secondary | ICD-10-CM

## 2012-02-16 DIAGNOSIS — E785 Hyperlipidemia, unspecified: Secondary | ICD-10-CM | POA: Insufficient documentation

## 2012-02-16 DIAGNOSIS — W19XXXA Unspecified fall, initial encounter: Secondary | ICD-10-CM | POA: Insufficient documentation

## 2012-02-16 DIAGNOSIS — M25562 Pain in left knee: Secondary | ICD-10-CM

## 2012-02-16 DIAGNOSIS — M810 Age-related osteoporosis without current pathological fracture: Secondary | ICD-10-CM | POA: Insufficient documentation

## 2012-02-16 DIAGNOSIS — I4819 Other persistent atrial fibrillation: Secondary | ICD-10-CM | POA: Diagnosis present

## 2012-02-16 DIAGNOSIS — I358 Other nonrheumatic aortic valve disorders: Secondary | ICD-10-CM

## 2012-02-16 DIAGNOSIS — Z853 Personal history of malignant neoplasm of breast: Secondary | ICD-10-CM | POA: Insufficient documentation

## 2012-02-16 DIAGNOSIS — S8000XA Contusion of unspecified knee, initial encounter: Secondary | ICD-10-CM | POA: Insufficient documentation

## 2012-02-16 LAB — CBC
HCT: 31.6 % — ABNORMAL LOW (ref 36.0–46.0)
Hemoglobin: 10.4 g/dL — ABNORMAL LOW (ref 12.0–15.0)
MCH: 31.3 pg (ref 26.0–34.0)
MCHC: 32.9 g/dL (ref 30.0–36.0)
MCV: 95.2 fL (ref 78.0–100.0)
Platelets: 172 10*3/uL (ref 150–400)
RBC: 3.32 MIL/uL — ABNORMAL LOW (ref 3.87–5.11)
RDW: 15.5 % (ref 11.5–15.5)
WBC: 7.5 10*3/uL (ref 4.0–10.5)

## 2012-02-16 LAB — BASIC METABOLIC PANEL
BUN: 28 mg/dL — ABNORMAL HIGH (ref 6–23)
CO2: 29 mEq/L (ref 19–32)
Calcium: 9.2 mg/dL (ref 8.4–10.5)
Chloride: 101 mEq/L (ref 96–112)
Creatinine, Ser: 0.93 mg/dL (ref 0.50–1.10)
GFR calc Af Amer: 62 mL/min — ABNORMAL LOW (ref >90)
GFR calc non Af Amer: 54 mL/min — ABNORMAL LOW (ref >90)
Glucose, Bld: 147 mg/dL — ABNORMAL HIGH (ref 70–99)
Potassium: 4.7 mEq/L (ref 3.5–5.1)
Sodium: 137 mEq/L (ref 135–145)

## 2012-02-16 LAB — PROTIME-INR
INR: 1.24 (ref 0.00–1.49)
Prothrombin Time: 15.4 seconds — ABNORMAL HIGH (ref 11.6–15.2)

## 2012-02-16 LAB — POCT INR: INR: 1.3

## 2012-02-16 LAB — TROPONIN I: Troponin I: <0.3 ng/mL (ref <0.30)

## 2012-02-16 MED ORDER — ETOMIDATE 2 MG/ML IV SOLN
INTRAVENOUS | Status: AC | PRN
  Administered 2012-02-16: 10 mg via INTRAVENOUS

## 2012-02-16 MED ORDER — MIDAZOLAM HCL 2 MG/2ML IJ SOLN
2.0000 mg | Freq: Once | INTRAMUSCULAR | Status: DC
  Filled 2012-02-16: qty 2

## 2012-02-16 MED ORDER — SODIUM CHLORIDE 0.9 % IV SOLN: 1000.0000 mL | INTRAVENOUS | Status: DC

## 2012-02-16 MED ORDER — FENTANYL CITRATE 0.05 MG/ML IJ SOLN
50.0000 ug | Freq: Once | INTRAMUSCULAR | Status: DC
  Filled 2012-02-16: qty 2

## 2012-02-16 MED ORDER — METOPROLOL TARTRATE 1 MG/ML IV SOLN
5.0000 mg | Freq: Once | INTRAVENOUS | Status: AC
  Administered 2012-02-16: 5 mg via INTRAVENOUS
  Filled 2012-02-16: qty 5

## 2012-02-16 MED ORDER — PROPOFOL 10 MG/ML IV BOLUS
40.0000 mg | Freq: Once | INTRAVENOUS | Status: DC
  Filled 2012-02-16: qty 40

## 2012-02-16 MED ORDER — ETOMIDATE 2 MG/ML IV SOLN
20.0000 mg | Freq: Once | INTRAVENOUS | Status: DC
  Filled 2012-02-16: qty 10

## 2012-02-16 MED ORDER — SODIUM CHLORIDE 0.9 % IV SOLN
1000.0000 mL | Freq: Once | INTRAVENOUS | Status: AC
  Administered 2012-02-16: 1000 mL via INTRAVENOUS

## 2012-02-16 NOTE — ED Provider Notes (Addendum)
After sign out, the cardiologist Dr. Johney Frame, came to me and discussed cardioversion. As per patient, she is in paroxysmal afib. Afib started within 2 days ago. Her last meal was around 9am even though she sipped some water about 30 min prior to procedure. I performed conscious sedation and cardioversion with Dr. Tillie Rung present. See procedure note. After cardioversion, she went back into sinus rhythm with EKG below. Dr. Tillie Rung will reassess patient in 1 hr and decide the dispo.    Date: 02/16/2012 Post cardioversion  Rate: 55  Rhythm: normal sinus rhythm  QRS Axis: normal  Intervals: normal  ST/T Wave abnormalities: ST depressions laterally  Conduction Disutrbances:none  Narrative Interpretation:   Old EKG Reviewed: changes noted  Procedural sedation Performed by: Chaney Malling Consent: Verbal consent obtained. Risks and benefits: risks, benefits and alternatives were discussed Required items: required blood products, implants, devices, and special equipment available Patient identity confirmed: arm band and provided demographic data Time out: Immediately prior to procedure a "time out" was called to verify the correct patient, procedure, equipment, support staff and site/side marked as required.  Sedation type: moderate (conscious) sedation NPO time confirmed and considedered  Sedatives: ETOMIDATE 10 mg IV   Physician Time at Bedside: 30 min  Vitals: Vital signs were monitored during sedation. Cardiac Monitor, pulse oximeter Patient tolerance: Patient tolerated the procedure well with no immediate complications. Comments: Pt with uneventful recovered. Returned to pre-procedural sedation baseline    Cardioversion performed with 200 J after patient was sedated. Patient went back to sinus rhythm immediately. No complications.    6:56 PM Patient feels well, no issues after cardioversion. Dr. Johney Frame reassessed the patient as well and said that she can be discharged.She remained in sinus  rhythm.  She will follow up outpatient. Will continue current dose of coumadin for now as the low INR is possibly from her not taking coumadin several days ago.     Richardean Canal, MD 02/16/12 1857  Richardean Canal, MD 02/17/12 0130

## 2012-02-16 NOTE — ED Notes (Signed)
Getting room prepared to Cardiovert patient at bedside. MD Silverio Lay at bedside with 2 RNs and Tech. Code cart at bedside. Pads attached to patient. Consent Form signed by patient and MD explained all risks associated with procedure.  Pt on Ambu bag at 100%. Pt alert and oriented. No signs of distress noted. Pt currently in AFIB heart rate fluct between 60-90.

## 2012-02-16 NOTE — ED Provider Notes (Signed)
History     CSN: [id number]  Arrival date & time 02/16/12  1149   First MD Initiated Contact with Patient 02/16/12 1151      Chief Complaint  Patient presents with  . Atrial Fibrillation     The history is provided by the patient.   the patient has a history of paroxysmal atrial fibrillation for which he takes Coumadin.  She reports that she is normally in sinus rhythm and only very rarely flips into atrial fibrillation.  She fell 3 days ago and injured her left leg.  She is seeing her orthopedist and reports that she has no fracture.  She reports ongoing pain in her left knee and significant ecchymosis in her left lower extremity.  She reports because of this she stopped her Coumadin on her own without discussing this with her physician.  She held her Coumadin 2 days ago and decreased her dose last night.  She reports ongoing pain in the left knee and now new swelling in the left leg.  She has no history of deep vein thrombosis although this left leg swelling concerns her.  She's had no chest pain or shortness of breath.  She reports she's otherwise been compliant with her medications including her calcium channel blockers and beta blockers.  She has been able to cannulate on her left leg although she does report that this hurts.  She continues to have an event palpitations without chest pain or shortness of breath.  She was seen at her physician's office today that because her heart rate was in the 130s she was sent to the emergency department for atrial fibrillation with rapid ventricular response.  On arrival to the emergency department her heart rate is 120 however on my arrival to the patient's bedside her heart rate is 96.   Past Medical History  Diagnosis Date  . Personal history of malignant neoplasm of breast   . Personal history of other disorders of nervous system and sense organs   . Unspecified closed fracture of pelvis   . Closed fracture of unspecified part of femur   .  Encounter for long-term (current) use of anticoagulants   . Atrial fibrillation     Paroxysmal, infrequent, Coumadin  . Hyperlipidemia   . Osteoporosis   . Arthritis   . Hypertension   . Diabetes mellitus   . Diverticulosis   . Diverticulitis   . Syncope   . Chest pain     Nuclear, April, 2008, no ischemia,  . Ejection fraction     EF 60%, echo, February, 2008  . Aortic valve sclerosis     Echo, 2008  . Warfarin anticoagulation   . Bradycardia     October, 2012    Past Surgical History  Procedure Date  . Abdominal hysterectomy 1995  . Shoulder surgery     left  . Mastectomy 1995    left  . Femur surgery   . Wrist surgery      x 2     Family History  Problem Relation Age of Onset  . Heart failure Mother 72  . Heart attack Father 12  . Diabetes Father   . Colon cancer Neg Hx     History  Substance Use Topics  . Smoking status: Never Smoker   . Smokeless tobacco: Never Used  . Alcohol Use: No    OB History    Grav Para Term Preterm Abortions TAB SAB Ect Mult Living  Review of Systems  All other systems reviewed and are negative.    Allergies  Codeine and Codeine sulfate  Home Medications   Current Outpatient Rx  Name Route Sig Dispense Refill  . ALPRAZOLAM 0.5 MG PO TABS Oral Take 1 tablet (0.5 mg total) by mouth at bedtime as needed. 30 tablet 5  . VITAMIN C 500 MG PO TABS Oral Take 500 mg by mouth daily.      . B COMPLEX PO TABS Oral Take 1 tablet by mouth daily.      [name] CALCIUM CARBONATE-VITAMIN D 600-400 MG-UNIT PO TABS Oral Take 1 tablet by mouth daily.      [name] VITAMIN D 2000 UNITS PO TABS Oral Take 2,000 Units by mouth daily.      [name] DIGOXIN 0.125 MG PO TABS Oral Take 1 tablet (125 mcg total) by mouth daily. 90 tablet 3  . DILTIAZEM HCL ER COATED BEADS 240 MG PO CP24 Oral Take 1 capsule (240 mg total) by mouth daily. 90 capsule 3  . GLUCOSE BLOOD VI STRP Other 1 each by Other route 2 (two) times daily. Use as instructed     .  LOSARTAN POTASSIUM-HCTZ 100-12.5 MG PO TABS Oral Take 1 tablet by mouth daily. 90 tablet 3  . MAGNESIUM 250 MG PO TABS Oral Take 1 tablet by mouth daily.      [name] METOPROLOL TARTRATE 25 MG PO TABS Oral Take 1 tablet (25 mg total) by mouth 2 (two) times daily. 60 tablet 8  . ONE-DAILY MULTI VITAMINS PO TABS Oral Take 1 tablet by mouth daily.      [name] PIOGLITAZONE HCL 45 MG PO TABS Oral Take 45 mg by mouth daily.      [name] POTASSIUM CHLORIDE ER 8 MEQ PO TBCR Oral Take 1 tablet (8 mEq total) by mouth daily. 90 tablet 3  . RALOXIFENE HCL 60 MG PO TABS Oral Take 1 tablet (60 mg total) by mouth daily. 90 tablet 3  . ROSUVASTATIN CALCIUM 5 MG PO TABS Oral Take 5 mg by mouth. 3 days a week    . SAXAGLIPTIN-METFORMIN ER 2.09-998 MG PO TB24 Oral Take 1 tablet by mouth daily. 180 tablet 3  . WARFARIN SODIUM 5 MG PO TABS  TAKE 2,2,1.5 AND CONTINUE THE SEQUENCE 100 tablet 3    BP 121/85  Pulse 110  Temp 97.7 F (36.5 C) (Oral)  Resp 20  SpO2 100%  Physical Exam  Nursing note and vitals reviewed. Constitutional: She is oriented to person, place, and time. She appears well-developed and well-nourished. No distress.  HENT:  Head: Normocephalic and atraumatic.  Eyes: EOM are normal.  Neck: Normal range of motion.  Cardiovascular: Normal rate, regular rhythm and normal heart sounds.        Irregularly irregular  Pulmonary/Chest: Effort normal and breath sounds normal.  Abdominal: Soft. She exhibits no distension. There is no tenderness.  Musculoskeletal: Normal range of motion.       Significant ecchymosis of her left lower extremity.  Normal pulses in her left foot.  She does have tenderness along her medial-anterior joint line.  Her left leg is swollen as compared to her right.  She has no swelling of her upper legs bilaterally  Neurological: She is alert and oriented to person, place, and time.  Skin: Skin is warm and dry.  Psychiatric: She has a normal mood and affect. Judgment normal.    ED Course    Procedures (including critical care time)   Date:  02/16/2012  Rate: 96  Rhythm: Atrial fibrillation  QRS Axis: normal  Intervals: normal  ST/T Wave abnormalities: normal  Conduction Disutrbances: none  Narrative Interpretation:   Old EKG Reviewed: Change from prior EKG    Labs Reviewed  CBC - Abnormal; Notable for the following:    RBC 3.32 (*)     Hemoglobin 10.4 (*)     HCT 31.6 (*)     All other components within normal limits  BASIC METABOLIC PANEL - Abnormal; Notable for the following:    Glucose, Bld 147 (*)     BUN 28 (*)     GFR calc non Af Amer 54 (*)     GFR calc Af Amer 62 (*)     All other components within normal limits  PROTIME-INR - Abnormal; Notable for the following:    Prothrombin Time 15.4 (*)     All other components within normal limits  TROPONIN I   Dg Chest 2 View  02/16/2012  *RADIOLOGY REPORT*  Clinical Data: Atrial fibrillation  CHEST - 2 VIEW  Comparison: None  Findings: Mild cardiac enlargement.  No pleural effusion or edema. No airspace consolidation.  Chronic interstitial coarsening is noted.  A scoliosis deformity involves the thoracic spine.  IMPRESSION: No acute cardiopulmonary abnormalities.   Original Report Authenticated By: Rosealee Albee, M.D.    Dg Knee Complete 4 Views Left  02/16/2012  *RADIOLOGY REPORT*  Clinical Data: Atrial fibrillation  LEFT KNEE - COMPLETE 4+ VIEW  Comparison: None  Findings: There is no joint effusion.  There is no fracture or subluxation.  No radio-opaque foreign body or soft tissue calcifications.  Moderate degenerative changes involve the knee joint.  This includes medial compartment narrowing, marginal spur formation and sharpening of the tibial spines.  IMPRESSION:  1.  Osteoarthritis. 2.  No acute findings.   Original Report Authenticated By: Rosealee Albee, M.D.     I personally reviewed the imaging tests through PACS system I reviewed available ER/hospitalization records thought the EMR   1. Atrial  fibrillation   2. Left knee pain   3. Paroxysmal a-fib       MDM  3:57 PM The patient remains in atrial fibrillation this time despite Lopressor.  Cardiology reconsult within the patient will be admitted the hospital as I believe it is important to get her out of her atrial fibrillation.  Lower extremity duplex ultrasound negative for DVT.  Arthritis of left knee without acute fracture.  Subtherapeutic INR.        Lyanne Co, MD 02/16/12 [phone]

## 2012-02-16 NOTE — ED Notes (Signed)
MD at bedside. Cardiology 

## 2012-02-16 NOTE — ED Notes (Signed)
Family at bedside. 

## 2012-02-16 NOTE — H&P (Addendum)
Patient ID: BRELAND ELDERS MRN: [id number], DOB/AGE: Jun 23, 1922   Consult date: 02/16/2012   Primary Physician: Carrie Mew, MD Primary Cardiologist: Lovena Neighbours, MD  Pt. Profile:  78 year old female with history of paroxysmal atrial fibrillation who presents with light-headedness and palpitations since Monday.   Problem List  Past Medical History  Diagnosis Date  . Personal history of malignant neoplasm of breast   . Personal history of other disorders of nervous system and sense organs   . Unspecified closed fracture of pelvis   . Closed fracture of unspecified part of femur   . Encounter for long-term (current) use of anticoagulants   . Atrial fibrillation     Paroxysmal, infrequent, Coumadin  . Hyperlipidemia   . Osteoporosis   . Arthritis   . Hypertension   . Diabetes mellitus   . Diverticulosis   . Diverticulitis   . Syncope   . Chest pain     Nuclear, April, 2008, no ischemia,  . Ejection fraction     EF 60%, echo, February, 2008  . Aortic valve sclerosis     Echo, 2008  . Warfarin anticoagulation   . Bradycardia     October, 2012    Past Surgical History  Procedure Date  . Abdominal hysterectomy 1995  . Shoulder surgery     left  . Mastectomy 1995    left  . Femur surgery   . Wrist surgery      x 2     Allergies  Allergies  Allergen Reactions  . Codeine   . Codeine Sulfate     REACTION: unspecified   HPI  78 year old female with above problem list which includes PAF (anitcoagulated with warfarin), HTN, HL, and non-ischemic chest pain (via nuclear study 2008). She has only ever been hospitalized on one other occasion for afib approx 10 y ago.  She has may have a brief episode of PAF about once/yr, which never lasts more than a day.  Mrs. Angelica fell on Sunday after being tripped, and cites no dizziness at that time. Left leg became substantially ecchymotic on Monday and as a result she held her warfarin.  Monday evening, she also became  light-headed and had palpitations at rest.  She had no chest pain, DOE, or other associated Ss. She identified these palps and dizziness as being similar to prior episodes of afib.  She checked her pulse at home, which was in the 90's on Monday and progressively increased until this morning when it was in the 150's. Lightheaded and palpitations remained the only symptoms. She was seen in clinic this morning concerned about the tachycardia and was found to have A-fib with RVR, hypotension, and sub-therapeutic INR (1.3). She was advised to f/u in ED and was transferred to [facility] via EMS. She was given 5 mg of IV metoprolol and has a liter of NS is infusing 2/2 borderline hypotn. Presently she has intermittent lightheadedness and palpitations. She denies chest pain, dyspnea, and nausea/vomiting.  Home Medications  Prior to Admission medications   Medication Sig Start Date End Date Taking? Authorizing Provider  ALPRAZolam Prudy Feeler) 0.5 MG tablet Take 1 tablet (0.5 mg total) by mouth at bedtime as needed. 08/24/11   Stacie Glaze, MD  Ascorbic Acid (VITAMIN C) 500 MG tablet Take 500 mg by mouth daily.      Historical Provider, MD  b complex vitamins tablet Take 1 tablet by mouth daily.      Historical Provider, MD  Calcium Carbonate-Vitamin D (CALTRATE 600+D) 600-400 MG-UNIT per tablet Take 1 tablet by mouth daily.      Historical Provider, MD  Cholecalciferol (VITAMIN D) 2000 UNITS tablet Take 2,000 Units by mouth daily.      Historical Provider, MD  digoxin (LANOXIN) 0.125 MG tablet Take 1 tablet (125 mcg total) by mouth daily. 06/15/11   Stacie Glaze, MD  diltiazem (CARDIZEM CD) 240 MG 24 hr capsule Take 1 capsule (240 mg total) by mouth daily. 03/03/11   Stacie Glaze, MD  glucose blood (TRUETEST TEST) test strip 1 each by Other route 2 (two) times daily. Use as instructed     Historical Provider, MD  losartan-hydrochlorothiazide (HYZAAR) 100-12.5 MG per tablet Take 1 tablet by mouth daily. 03/03/11   Stacie Glaze, MD  Magnesium 250 MG TABS Take 1 tablet by mouth daily.      Historical Provider, MD  metoprolol tartrate (LOPRESSOR) 25 MG tablet Take 1 tablet (25 mg total) by mouth 2 (two) times daily. 10/22/11 10/21/12  Luis Abed, MD  Multiple Vitamin (MULTIVITAMIN) tablet Take 1 tablet by mouth daily.      Historical Provider, MD  pioglitazone (ACTOS) 45 MG tablet Take 45 mg by mouth daily.      Historical Provider, MD  potassium chloride (KLOR-CON) 8 MEQ tablet Take 1 tablet (8 mEq total) by mouth daily. 06/15/11 06/14/12  Stacie Glaze, MD  raloxifene (EVISTA) 60 MG tablet Take 1 tablet (60 mg total) by mouth daily. 09/13/11   Stacie Glaze, MD  rosuvastatin (CRESTOR) 5 MG tablet Take 5 mg by mouth. 3 days a week    Historical Provider, MD  Saxagliptin-Metformin 2.09-998 MG TB24 Take 1 tablet by mouth daily. 10/22/11   Stacie Glaze, MD  warfarin (COUMADIN) 5 MG tablet TAKE 2,2,1.5 AND CONTINUE THE SEQUENCE 08/12/11   Stacie Glaze, MD   Family History  Family History  Problem Relation Age of Onset  . Heart failure Mother 31  . Heart attack Father 62  . Diabetes Father   . Colon cancer Neg Hx    Social History  History   Social History  . Marital Status: Married    Spouse Name: N/A    Number of Children: 2  . Years of Education: N/A   Occupational History  . Piano Teacher    Social History Main Topics  . Smoking status: Never Smoker   . Smokeless tobacco: Never Used  . Alcohol Use: No  . Drug Use: No  . Sexually Active: Not Currently   Other Topics Concern  . Not on file   Social History Narrative  . No narrative on file    Review of Systems General:  No chills, fever, night sweats or weight changes.  Cardiovascular: ++ palpitations.  No chest pain, dyspnea on exertion, edema, orthopnea, paroxysmal nocturnal dyspnea. Dermatological: No rash, lesions/masses Respiratory: No cough, dyspnea Urologic: No hematuria, dysuria Abdominal:   No nausea, vomiting, diarrhea,  bright red blood per rectum, melena, or hematemesis Neurologic:  Light-headedness. No visual changes, wkns, changes in mental status. All other systems reviewed and are otherwise negative except as noted above.  Physical Exam  Blood pressure 108/56, pulse 79, temperature 97.7 F (36.5 C), temperature source Oral, resp. rate 17, SpO2 95.00%.  General: Pleasant, NAD Psych: Normal affect. Neuro: Alert and oriented X 3. Moves all extremities spontaneously. HEENT: Normal  Neck: Supple without bruits or JVD. Lungs:  Resp regular and unlabored, CTA. Heart:  Irregularly irregular.  no s3, s4, or murmurs. Abdomen: Soft, non-tender, non-distended, BS + x 4.  Extremities: No clubbing, cyanosis or edema. DP/PT/Radials 2+ and equal bilaterally.  Labs  Basename 02/16/12 1228  CKTOTAL --  CKMB --  TROPONINI <0.30   Lab Results  Component Value Date   WBC 7.5 02/16/2012   HGB 10.4* 02/16/2012   HCT 31.6* 02/16/2012   MCV 95.2 02/16/2012   PLT 172 02/16/2012     Lab 02/16/12 1228  NA 137  K 4.7  CL 101  CO2 29  BUN 28*  CREATININE 0.93  CALCIUM 9.2  PROT --  BILITOT --  ALKPHOS --  ALT --  AST --  GLUCOSE 147*   Lab Results  Component Value Date   CHOL 155 06/21/2011   HDL 56.90 06/21/2011   LDLCALC 67 06/21/2011   TRIG 158.0* 06/21/2011    Lab Results  Component Value Date   INR 1.24 02/16/2012   INR 1.3 02/16/2012   INR 2.2 01/26/2012   Radiology/Studies  No results found.  ECG  Atrial Fibrillation. Rate 96. ST depression in leads V5 and V6  ASSESSMENT AND PLAN  1.  PAF: Patient has a history of this and has had palpitations and lightheadedness since Monday evening. ECG shows a-fib with rates ranging from 80 - 130. She is just under a 48hr window of afib.  INR was last therapeutic on 9/4 and is subRx today.  As she is still w/in 48 hrs, we will plan to cardiovert her here in the ED.   She is agreeable to proceed.   2. HTN: continue home medication.   3. HL: continue  Crestor.   4.  Left LE Bruising s/p Fall:  Tripped over another's cane on Sunday.  She significant bruising and slight swelling.  Knee films show no acute findings.  She has been ambulatory.  Signed, Nicolasa Ducking, NP 02/16/2012, 3:31 PM    I have seen, examined the patient, and reviewed the above assessment and plan.  Changes to above are made where necessary.  The patient presents with less than 48 hour duration of atrial fibrillation precipitated by recent leg trauma.  At this time, I would advise cardioversion and resumption of her prior medicine regimen.  Her INR is subtherapeutic however her arrhythmia is clearly less than 48 hours in duration.  Per AHA guidelines, cardioversion in this setting is reasonable. Risks of cardioversion were discussed at length with patient and family who wish to proceed.  Co Sign: Hillis Range, MD  Addendum Pt successfully cardioverted to sinus with a single 200J shock.  She and her family are very clear that they want to go home and would not want to be admitted. She will therefore be discharged today.  She will resume her prior medicine regimen.  She will follow-up with PCP for leg swelling/ discomfort.  She is also instructed to have an INR obtained on Monday.  She will contact our office should afib return or any other problems arise.  Fayrene Fearing Berdell Hostetler,MD

## 2012-02-16 NOTE — ED Notes (Signed)
Pt back from scan 

## 2012-02-16 NOTE — ED Notes (Signed)
To ED from PCP via EMS, pt states she fell on Sunday - has significant bruising to left leg, good PMS, also states she thinks she has been in A fib since Monday, VSS, 22g L forearm, NAD

## 2012-02-16 NOTE — ED Notes (Signed)
Pt converted to Normal sinus. Pt alert and talking to family. Dr. Gaspar Garbe at bedside. Family at bedside. VS stable. No signs of distress noted

## 2012-02-16 NOTE — Progress Notes (Addendum)
Chief Complaint  Patient presents with  . Fall  . Atrial Fibrillation    HPI:  Ms. Boule is normally followed by her PCP (Dr. Lovell Sheehan) and the coumadin clinic, but is here for an urgent visit today for concerns regarding bruising and for feeling like she is going to pass out. -reports fall 4 days ago and saw ortho for this with no fxs -INR subtherapeutic today at 1.3 -feeling lightheaded and with fluttering in chest since Monday -feels like she is going to pass out now and feels weak -report HR 152 at home today -denies CP or SOB -has had episodes of higher HR and symptoms occ, but this usually always goes away - this time symptoms not going away and feeling worse - family wanted to call 911 last night because she was feeling bad -taking dilt, metop and dig as on med list -EKG today with A. Fib and HR 129 with ST dep in multiple leads not observed on previous EKGs in records  ROS: See pertinent positives and negatives per HPI.  Past Medical History  Diagnosis Date  . Personal history of malignant neoplasm of breast   . Personal history of other disorders of nervous system and sense organs   . Unspecified closed fracture of pelvis   . Closed fracture of unspecified part of femur   . Encounter for long-term (current) use of anticoagulants   . Atrial fibrillation     Paroxysmal, infrequent, Coumadin  . Hyperlipidemia   . Osteoporosis   . Arthritis   . Hypertension   . Diabetes mellitus   . Diverticulosis   . Diverticulitis   . Syncope   . Chest pain     Nuclear, April, 2008, no ischemia,  . Ejection fraction     EF 60%, echo, February, 2008  . Aortic valve sclerosis     Echo, 2008  . Warfarin anticoagulation   . Bradycardia     October, 2012    Family History  Problem Relation Age of Onset  . Heart failure Mother 84  . Heart attack Father 74  . Diabetes Father   . Colon cancer Neg Hx     History   Social History  . Marital Status: Married    Spouse Name:  N/A    Number of Children: 2  . Years of Education: N/A   Occupational History  . Piano Teacher    Social History Main Topics  . Smoking status: Never Smoker   . Smokeless tobacco: Never Used  . Alcohol Use: No  . Drug Use: No  . Sexually Active: Not Currently   Other Topics Concern  . None   Social History Narrative  . None    Current outpatient prescriptions:ALPRAZolam (XANAX) 0.5 MG tablet, Take 1 tablet (0.5 mg total) by mouth at bedtime as needed., Disp: 30 tablet, Rfl: 5;  Ascorbic Acid (VITAMIN C) 500 MG tablet, Take 500 mg by mouth daily.  , Disp: , Rfl: ;  b complex vitamins tablet, Take 1 tablet by mouth daily.  , Disp: , Rfl: ;  Calcium Carbonate-Vitamin D (CALTRATE 600+D) 600-400 MG-UNIT per tablet, Take 1 tablet by mouth daily.  , Disp: , Rfl:  Cholecalciferol (VITAMIN D) 2000 UNITS tablet, Take 2,000 Units by mouth daily.  , Disp: , Rfl: ;  digoxin (LANOXIN) 0.125 MG tablet, Take 1 tablet (125 mcg total) by mouth daily., Disp: 90 tablet, Rfl: 3;  diltiazem (CARDIZEM CD) 240 MG 24 hr capsule, Take 1 capsule (240 mg  total) by mouth daily., Disp: 90 capsule, Rfl: 3;  glucose blood (TRUETEST TEST) test strip, 1 each by Other route 2 (two) times daily. Use as instructed , Disp: , Rfl:  losartan-hydrochlorothiazide (HYZAAR) 100-12.5 MG per tablet, Take 1 tablet by mouth daily., Disp: 90 tablet, Rfl: 3;  Magnesium 250 MG TABS, Take 1 tablet by mouth daily.  , Disp: , Rfl: ;  metoprolol tartrate (LOPRESSOR) 25 MG tablet, Take 1 tablet (25 mg total) by mouth 2 (two) times daily., Disp: 60 tablet, Rfl: 8;  Multiple Vitamin (MULTIVITAMIN) tablet, Take 1 tablet by mouth daily.  , Disp: , Rfl:  pioglitazone (ACTOS) 45 MG tablet, Take 45 mg by mouth daily.  , Disp: , Rfl: ;  potassium chloride (KLOR-CON) 8 MEQ tablet, Take 1 tablet (8 mEq total) by mouth daily., Disp: 90 tablet, Rfl: 3;  raloxifene (EVISTA) 60 MG tablet, Take 1 tablet (60 mg total) by mouth daily., Disp: 90 tablet, Rfl: 3;   rosuvastatin (CRESTOR) 5 MG tablet, Take 5 mg by mouth. 3 days a week, Disp: , Rfl:  Saxagliptin-Metformin 2.09-998 MG TB24, Take 1 tablet by mouth daily., Disp: 180 tablet, Rfl: 3;  warfarin (COUMADIN) 5 MG tablet, TAKE 2,2,1.5 AND CONTINUE THE SEQUENCE, Disp: 100 tablet, Rfl: 3  EXAM:  Filed Vitals:   02/16/12 1022  BP: 98/64  Pulse: 120  Temp: 97.6 F (36.4 C)    There is no height on file to calculate BMI.  GENERAL: vitals reviewed and listed below, alert, oriented, appears well hydrated but in no acute distress.   HEENT: atraumatic, conjucntiva clear, no obvious abnormalities on inspection of external nose and ears  NECK: no masses on inspection  LUNGS: clear to auscultation bilaterally, no wheezes, rales or rhonchi, good air movement  CV: irr irr, pulse 142   MS/SKIN: moves all extremities without noticeable abnormality -large area of swelling and ecchymosis L LE knee to ankle  PSYCH: pleasant and cooperative  ASSESSMENT AND PLAN:  Discussed the following assessment and plan:  1. Atrial fibrillation  EKG 12-Lead   -Symptomatic (pre-syncopal) A. Fib with rate 130s-140s on exam with subtherapeutic INR, BP  lower then usual.. EKG with A. Fib with RVR and ST changes. -advised further evaluation and management in the ED/hospital -ambulance transport provided - [employer] paged to alert    Orders Placed This Encounter  Procedures  . POCT INR    This external order was created through the Results Console.  . EKG 12-Lead   > 25 minutes spent in face to face interaction and coordination of care Patient Instructions  Today (Wednesday), tomorrow (Thursday) and Friday take an extra 1/2 tablet in addition to normal dose. Then continue10 mg Monday, Wednesday, Friday. Then 7.5 mg every other day. Check in 2 weeks.     Return to clinic immediately if symptoms worsen or persist or new concerns.  Return in about 2 weeks (around 03/01/2012).  Kriste Basque R.

## 2012-02-16 NOTE — Op Note (Signed)
EP procedure Note   Pre procedure Diagnosis:  Persistent Atrial fibrillation Post procedure Diagnosis:  Same  Procedures:  Electrical cardioversion  Description:  Informed, written consent was obtained for cardioversion.  Adequate IV acces and airway support were assured.  The patient was adequately sedated with intravenous medicine as outlined in the ER doctor's report.  The patient presented today in atrial fibrillation of less than 48 hour duration.  She was successfully cardioverted to sinus rhythm with a single synchronized biphasic 200J shock delivered with cardioversion electrodes placed in the anterior/posterior configuration.  She remains in sinus rhythm thereafter.  There were no early apparent complications.  Conclusions:  1.  Successful cardioversion of afib to sinus rhythm 2.  No early apparent complications.   Tara Fearing Kennede Lusk,MD 5:39 PM 02/16/2012

## 2012-02-16 NOTE — ED Notes (Signed)
Pt stable, alert and oriented. No signs of distress noted

## 2012-02-16 NOTE — Progress Notes (Signed)
VASCULAR LAB PRELIMINARY  PRELIMINARY  PRELIMINARY  PRELIMINARY  Left lower extremity venous duplex completed.    Preliminary report:  Left:  No evidence of DVT, superficial thrombosis, or Baker's cyst.  Tara Bradley, RVS 02/16/2012, 2:30 PM

## 2012-02-16 NOTE — Patient Instructions (Signed)
Today (Wednesday), tomorrow (Thursday) and Friday take an extra 1/2 tablet in addition to normal dose. Then continue10 mg Monday, Wednesday, Friday. Then 7.5 mg every other day. Check in 2 weeks.

## 2012-02-16 NOTE — Patient Instructions (Addendum)
  Latest dosing instructions   Total Sun Mon Tue Wed Thu Fri Sat   60 7.5 mg 10 mg 7.5 mg 10 mg 7.5 mg 10 mg 7.5 mg    (5 mg1.5) (5 mg2) (5 mg1.5) (5 mg2) (5 mg1.5) (5 mg2) (5 mg1.5)       Today (Wednesday), tomorrow (Thursday) and Friday take an extra 1/2 tablet in addition to normal dose. Then continue10 mg Monday, Wednesday, Friday. Then 7.5 mg every other day. Check in 2 weeks.

## 2012-02-16 NOTE — ED Notes (Signed)
MD at bedside. AFIB consult MD

## 2012-02-17 ENCOUNTER — Telehealth: Payer: Self-pay | Admitting: Cardiology

## 2012-02-17 NOTE — Telephone Encounter (Signed)
New Problem:    Patient had her heart shocked yesterday and needs to be scheduled to see Dr. Myrtis Ser within the next few days.  Please call back.

## 2012-02-17 NOTE — Telephone Encounter (Signed)
Pt was in the ER and was cardioverted.  She will get her inr checked on Monday as requested on discharge but wants to know if Dr Myrtis Ser needs to see her sooner than her appt in Nov?  Please advise.  She states she is not having any problems right now.

## 2012-02-18 NOTE — Telephone Encounter (Signed)
**Note De-Identified  Obfuscation** Appt. scheduled for October 16 @ 3 pm, Pt. Aware./LV

## 2012-02-18 NOTE — Telephone Encounter (Signed)
Please make appointment in mid to late October

## 2012-02-21 ENCOUNTER — Ambulatory Visit (INDEPENDENT_AMBULATORY_CARE_PROVIDER_SITE_OTHER): Payer: Medicare Other | Admitting: Family

## 2012-02-21 DIAGNOSIS — I4891 Unspecified atrial fibrillation: Secondary | ICD-10-CM

## 2012-02-21 LAB — POCT INR: INR: 2.4

## 2012-02-21 NOTE — Patient Instructions (Addendum)
Continue 10 mg Monday, Wednesday, Friday. Then 7.5 mg every other day. Check in 3 weeks.     Latest dosing instructions   Total Glynis Smiles Tue Wed Thu Fri Sat   60 7.5 mg 10 mg 7.5 mg 10 mg 7.5 mg 10 mg 7.5 mg    (5 mg1.5) (5 mg2) (5 mg1.5) (5 mg2) (5 mg1.5) (5 mg2) (5 mg1.5)

## 2012-02-22 ENCOUNTER — Emergency Department (HOSPITAL_COMMUNITY): Payer: Medicare Other

## 2012-02-22 ENCOUNTER — Encounter: Payer: Self-pay | Admitting: Family Medicine

## 2012-02-22 ENCOUNTER — Encounter (HOSPITAL_COMMUNITY): Payer: Self-pay | Admitting: *Deleted

## 2012-02-22 ENCOUNTER — Ambulatory Visit (INDEPENDENT_AMBULATORY_CARE_PROVIDER_SITE_OTHER): Payer: Medicare Other | Admitting: Family Medicine

## 2012-02-22 ENCOUNTER — Emergency Department (HOSPITAL_COMMUNITY)
Admission: EM | Admit: 2012-02-22 | Discharge: 2012-02-22 | Disposition: A | Discharge status: Home / Self Care | Payer: Medicare Other | Attending: Emergency Medicine | Admitting: Emergency Medicine

## 2012-02-22 VITALS — BP 110/60 | HR 79 | Temp 99.0°F | Wt 160.0 lb

## 2012-02-22 DIAGNOSIS — Z853 Personal history of malignant neoplasm of breast: Secondary | ICD-10-CM | POA: Insufficient documentation

## 2012-02-22 DIAGNOSIS — I10 Essential (primary) hypertension: Secondary | ICD-10-CM | POA: Insufficient documentation

## 2012-02-22 DIAGNOSIS — L0390 Cellulitis, unspecified: Secondary | ICD-10-CM

## 2012-02-22 DIAGNOSIS — M810 Age-related osteoporosis without current pathological fracture: Secondary | ICD-10-CM | POA: Insufficient documentation

## 2012-02-22 DIAGNOSIS — R21 Rash and other nonspecific skin eruption: Secondary | ICD-10-CM | POA: Insufficient documentation

## 2012-02-22 DIAGNOSIS — Z7901 Long term (current) use of anticoagulants: Secondary | ICD-10-CM | POA: Insufficient documentation

## 2012-02-22 DIAGNOSIS — E119 Type 2 diabetes mellitus without complications: Secondary | ICD-10-CM | POA: Insufficient documentation

## 2012-02-22 DIAGNOSIS — Z885 Allergy status to narcotic agent status: Secondary | ICD-10-CM | POA: Insufficient documentation

## 2012-02-22 DIAGNOSIS — Z79899 Other long term (current) drug therapy: Secondary | ICD-10-CM | POA: Insufficient documentation

## 2012-02-22 DIAGNOSIS — L0291 Cutaneous abscess, unspecified: Secondary | ICD-10-CM

## 2012-02-22 DIAGNOSIS — E785 Hyperlipidemia, unspecified: Secondary | ICD-10-CM | POA: Insufficient documentation

## 2012-02-22 DIAGNOSIS — I4891 Unspecified atrial fibrillation: Secondary | ICD-10-CM | POA: Insufficient documentation

## 2012-02-22 LAB — CBC WITH DIFFERENTIAL/PLATELET
Basophils Absolute: 0 10*3/uL (ref 0.0–0.1)
Basophils Relative: 0 % (ref 0–1)
Eosinophils Absolute: 0.1 10*3/uL (ref 0.0–0.7)
Eosinophils Relative: 2 % (ref 0–5)
HCT: 32.1 % — ABNORMAL LOW (ref 36.0–46.0)
Hemoglobin: 10.6 g/dL — ABNORMAL LOW (ref 12.0–15.0)
Lymphocytes Relative: 15 % (ref 12–46)
Lymphs Abs: 1 10*3/uL (ref 0.7–4.0)
MCH: 31.2 pg (ref 26.0–34.0)
MCHC: 33 g/dL (ref 30.0–36.0)
MCV: 94.4 fL (ref 78.0–100.0)
Monocytes Absolute: 0.7 10*3/uL (ref 0.1–1.0)
Monocytes Relative: 10 % (ref 3–12)
Neutro Abs: 5 10*3/uL (ref 1.7–7.7)
Neutrophils Relative %: 73 % (ref 43–77)
Platelets: 302 10*3/uL (ref 150–400)
RBC: 3.4 MIL/uL — ABNORMAL LOW (ref 3.87–5.11)
RDW: 15.3 % (ref 11.5–15.5)
WBC: 6.8 10*3/uL (ref 4.0–10.5)

## 2012-02-22 LAB — BASIC METABOLIC PANEL
BUN: 26 mg/dL — ABNORMAL HIGH (ref 6–23)
CO2: 32 mEq/L (ref 19–32)
Calcium: 10.1 mg/dL (ref 8.4–10.5)
Chloride: 99 mEq/L (ref 96–112)
Creatinine, Ser: 0.83 mg/dL (ref 0.50–1.10)
GFR calc Af Amer: 71 mL/min — ABNORMAL LOW (ref >90)
GFR calc non Af Amer: 62 mL/min — ABNORMAL LOW (ref >90)
Glucose, Bld: 128 mg/dL — ABNORMAL HIGH (ref 70–99)
Potassium: 4 mEq/L (ref 3.5–5.1)
Sodium: 139 mEq/L (ref 135–145)

## 2012-02-22 LAB — PROTIME-INR
INR: 2.43 — ABNORMAL HIGH (ref 0.00–1.49)
Prothrombin Time: 25.3 seconds — ABNORMAL HIGH (ref 11.6–15.2)

## 2012-02-22 MED ORDER — CLINDAMYCIN HCL 150 MG PO CAPS: 300.0000 mg | ORAL_CAPSULE | Freq: Three times a day (TID) | ORAL | Status: DC

## 2012-02-22 NOTE — ED Provider Notes (Signed)
History  Scribed for Tara Racer, MD, the patient was seen in room TR10C/TR10C. This chart was scribed by Tara Bradley. The patient's care started at 1:31 PM   CSN: [id number]  Arrival date & time 02/22/12  1036   First MD Initiated Contact with Patient 02/22/12 1254      Chief Complaint  Patient presents with  . Knee Pain    swelling , warmth and red     The history is provided by the patient. No language interpreter was used.   Tara Bradley is a 78 y.o. female who presents to the Emergency Department complaining of left knee pain after falling about one week ago.  She is experiencing redness, swelling and warmth to the left knee.  She states that the redness has not worsened since the fall.  She denies tenderness to the left groin or red streaking on the leg.  Pt was in the ED last week for Afib.  She reports that over the last few days she has felt tired, is experiencing loss of appetite, and feels bad overall.  Pt is currently on coumadin.  Nothing seems to make the pain better or worse. Past Medical History  Diagnosis Date  . Personal history of malignant neoplasm of breast   . Personal history of other disorders of nervous system and sense organs   . Unspecified closed fracture of pelvis   . Closed fracture of unspecified part of femur   . Encounter for long-term (current) use of anticoagulants   . Atrial fibrillation     Paroxysmal, infrequent, Coumadin  . Hyperlipidemia   . Osteoporosis   . Arthritis   . Hypertension   . Diabetes mellitus   . Diverticulosis   . Diverticulitis   . Syncope   . Chest pain     Nuclear, April, 2008, no ischemia,  . Ejection fraction     EF 60%, echo, February, 2008  . Aortic valve sclerosis     Echo, 2008  . Warfarin anticoagulation   . Bradycardia     October, 2012    Past Surgical History  Procedure Date  . Abdominal hysterectomy 1995  . Shoulder surgery     left  . Mastectomy 1995    left  . Femur surgery   .  Wrist surgery      x 2     Family History  Problem Relation Age of Onset  . Heart failure Mother 57  . Heart attack Father 41  . Diabetes Father   . Colon cancer Neg Hx     History  Substance Use Topics  . Smoking status: Never Smoker   . Smokeless tobacco: Never Used  . Alcohol Use: No    OB History    Grav Para Term Preterm Abortions TAB SAB Ect Mult Living                  Review of Systems  Constitutional: Positive for chills, activity change and appetite change. Negative for fever.  Musculoskeletal: Positive for joint swelling (left knee swelling) and arthralgias (left knee pain).  Skin: Positive for wound (to the left knee).  All other systems reviewed and are negative.    Allergies  Codeine and Codeine sulfate  Home Medications   Current Outpatient Rx  Name Route Sig Dispense Refill  . ALPRAZOLAM 0.5 MG PO TABS Oral Take 0.5 mg by mouth at bedtime as needed. For sleep    . VITAMIN C PO Oral Take 1 tablet  by mouth daily.    . B COMPLEX PO TABS Oral Take 1 tablet by mouth daily.      [name] CALCIUM CARBONATE-VITAMIN D 600-400 MG-UNIT PO TABS Oral Take 1 tablet by mouth daily.      [name] VITAMIN D 2000 UNITS PO TABS Oral Take 2,000 Units by mouth daily.      [name] DIGOXIN 0.125 MG PO TABS Oral Take 125 mcg by mouth daily.    [name] DILTIAZEM HCL ER COATED BEADS 240 MG PO CP24 Oral Take 240 mg by mouth daily.    [name] LOSARTAN POTASSIUM-HCTZ 100-12.5 MG PO TABS Oral Take 1 tablet by mouth daily.    [name] MAGNESIUM 250 MG PO TABS Oral Take 1 tablet by mouth daily.      [name] METOPROLOL TARTRATE 25 MG PO TABS Oral Take 25 mg by mouth 2 (two) times daily.    [name] ONE-DAILY MULTI VITAMINS PO TABS Oral Take 1 tablet by mouth daily.      [name] PIOGLITAZONE HCL 45 MG PO TABS Oral Take 45 mg by mouth daily.      [name] POTASSIUM CHLORIDE ER 8 MEQ PO TBCR Oral Take 8 mEq by mouth daily.    [name] RALOXIFENE HCL 60 MG PO TABS Oral Take 60 mg by mouth daily.    [name] ROSUVASTATIN CALCIUM 5 MG PO TABS Oral Take 5 mg by  mouth 3 (three) times a week. Mon, Weds & Fri    . SAXAGLIPTIN-METFORMIN ER 2.09-998 MG PO TB24 Oral Take 1 tablet by mouth daily.    . WARFARIN SODIUM 5 MG PO TABS Oral Take 7.5-10 mg by mouth. Takes 10 mg on Mon, Weds, & Fri and 7.5 mg all other days of the week    . GLUCOSE BLOOD VI STRP Other 1 each by Other route 2 (two) times daily. Use as instructed       BP 132/66  Pulse 57  Temp 98.7 F (37.1 C) (Oral)  Resp 16  SpO2 96%  Physical Exam  Nursing note and vitals reviewed. Constitutional: She is oriented to person, place, and time. She appears well-developed and well-nourished. No distress.  HENT:  Head: Normocephalic and atraumatic.  Eyes: EOM are normal. Pupils are equal, round, and reactive to light.  Neck: Neck supple. No tracheal deviation present.  Cardiovascular: Normal rate.   Pulmonary/Chest: Effort normal. No respiratory distress.  Musculoskeletal: Normal range of motion. She exhibits no edema.       Swelling and bruising to the left lower extremity.  Small skin tear to the anterior left knee with surrounding erythema.  No tenderness to palpation.  No streaking.  No inguinal lymphadenopathy.  Neurovascularly intact.    Neurological: She is alert and oriented to person, place, and time.  Skin: Skin is warm and dry.  Psychiatric: She has a normal mood and affect. Her behavior is normal.    ED Course  Procedures   DIAGNOSTIC STUDIES: Oxygen Saturation is 98% on room air, normal by my interpretation.    COORDINATION OF CARE:  11:07 Ordered: DG Knee Complete 4 Views; Protime-INR; CBC with Differential; Basic metabolic panel   Labs Reviewed  CBC WITH DIFFERENTIAL - Abnormal; Notable for the following:    RBC 3.40 (*)     Hemoglobin 10.6 (*)     HCT 32.1 (*)     All other components within normal limits  BASIC METABOLIC PANEL - Abnormal; Notable for the following:    Glucose, Bld 128 (*)  BUN 26 (*)     GFR calc non Af Amer 62 (*)     GFR calc Af Amer 71  (*)     All other components within normal limits  PROTIME-INR - Abnormal; Notable for the following:    Prothrombin Time 25.3 (*)     INR 2.43 (*)     All other components within normal limits   Dg Knee Complete 4 Views Left  02/22/2012  *RADIOLOGY REPORT*  Clinical Data: Left knee pain  post  fall 9 days ago  LEFT KNEE - COMPLETE 4+ VIEW  Comparison: 02/16/2012  Findings: Four views of the left knee submitted.  No acute fracture or subluxation.  Again noted narrowing of lateral joint compartment.  Mild spurring of lateral tibial plateau.  Stable spurring of the medial and lateral femoral condyle.  Mild narrowing of patellofemoral joint space.  No joint effusion.  IMPRESSION: No acute fracture or subluxation.  Again noted osteoarthritic changes as described above.   Original Report Authenticated By: Natasha Mead, M.D.      1. Erythematous rash       MDM  I personally performed the services described in this documentation, which was scribed in my presence. The recorded information has been reviewed and considered.  Clinical Decision Making:  Localized redness and warmth to anterior knee. No tracking, lymphadenopathy, TTP, fever, chills, WBC, tachycardia. Think this is unlikely cellulitis. Will cover with abx and mark and have f/u with PMD. If redness worsens, increased pain, fever chills or other concerns return immediately      Tara Racer, MD 02/22/12 1331

## 2012-02-22 NOTE — Progress Notes (Signed)
. Chief Complaint  Patient presents with  . infection in leg    warm to touch; swollen    HPI:  L leg swollen and red: -fell 10 days ago and L and had small skin tear and extreme ecchymosis and swelling of L LE. Seen by ortho at the time and had imaging then and DVT US of this leg when in hospital for A. Fib and all ok. -started to notice leg getting hot and red  And more sore about a week ago and this has progressed in the area of the L knee wear skin tear was present.  -friend whom is a nurse told her to see doctor immediately when she saw it as she was worried could be infected -she reports she has had some malaise and chills on and off the last few days and has felt yucky with no appetite and nausea. Denies CP, SOB, dizziness.  ROS: See pertinent positives and negatives per HPI.  Past Medical History  Diagnosis Date  . Personal history of malignant neoplasm of breast   . Personal history of other disorders of nervous system and sense organs   . Unspecified closed fracture of pelvis   . Closed fracture of unspecified part of femur   . Encounter for long-term (current) use of anticoagulants   . Atrial fibrillation     Paroxysmal, infrequent, Coumadin  . Hyperlipidemia   . Osteoporosis   . Arthritis   . Hypertension   . Diabetes mellitus   . Diverticulosis   . Diverticulitis   . Syncope   . Chest pain     Nuclear, April, 2008, no ischemia,  . Ejection fraction     EF 60%, echo, February, 2008  . Aortic valve sclerosis     Echo, 2008  . Warfarin anticoagulation   . Bradycardia     October, 2012    Family History  Problem Relation Age of Onset  . Heart failure Mother 33  . Heart attack Father 81  . Diabetes Father   . Colon cancer Neg Hx     History   Social History  . Marital Status: Married    Spouse Name: N/A    Number of Children: 2  . Years of Education: N/A   Occupational History  . Piano Teacher    Social History Main Topics  . Smoking status:  Never Smoker   . Smokeless tobacco: Never Used  . Alcohol Use: No  . Drug Use: No  . Sexually Active: Not Currently   Other Topics Concern  . None   Social History Narrative  . None    Current outpatient prescriptions:ALPRAZolam (XANAX) 0.5 MG tablet, Take 1 tablet (0.5 mg total) by mouth at bedtime as needed., Disp: 30 tablet, Rfl: 5;  Ascorbic Acid (VITAMIN C) 500 MG tablet, Take 500 mg by mouth daily.  , Disp: , Rfl: ;  b complex vitamins tablet, Take 1 tablet by mouth daily.  , Disp: , Rfl: ;  Calcium Carbonate-Vitamin D (CALTRATE 600+D) 600-400 MG-UNIT per tablet, Take 1 tablet by mouth daily.  , Disp: , Rfl:  Cholecalciferol (VITAMIN D) 2000 UNITS tablet, Take 2,000 Units by mouth daily.  , Disp: , Rfl: ;  digoxin (LANOXIN) 0.125 MG tablet, Take 1 tablet (125 mcg total) by mouth daily., Disp: 90 tablet, Rfl: 3;  diltiazem (CARDIZEM CD) 240 MG 24 hr capsule, Take 1 capsule (240 mg total) by mouth daily., Disp: 90 capsule, Rfl: 3;  glucose blood (TRUETEST TEST)  test strip, 1 each by Other route 2 (two) times daily. Use as instructed , Disp: , Rfl:  losartan-hydrochlorothiazide (HYZAAR) 100-12.5 MG per tablet, Take 1 tablet by mouth daily., Disp: 90 tablet, Rfl: 3;  Magnesium 250 MG TABS, Take 1 tablet by mouth daily.  , Disp: , Rfl: ;  metoprolol tartrate (LOPRESSOR) 25 MG tablet, Take 1 tablet (25 mg total) by mouth 2 (two) times daily., Disp: 60 tablet, Rfl: 8;  Multiple Vitamin (MULTIVITAMIN) tablet, Take 1 tablet by mouth daily.  , Disp: , Rfl:  pioglitazone (ACTOS) 45 MG tablet, Take 45 mg by mouth daily.  , Disp: , Rfl: ;  potassium chloride (KLOR-CON) 8 MEQ tablet, Take 1 tablet (8 mEq total) by mouth daily., Disp: 90 tablet, Rfl: 3;  raloxifene (EVISTA) 60 MG tablet, Take 1 tablet (60 mg total) by mouth daily., Disp: 90 tablet, Rfl: 3;  rosuvastatin (CRESTOR) 5 MG tablet, Take 5 mg by mouth. 3 days a week, Disp: , Rfl:  Saxagliptin-Metformin 2.09-998 MG TB24, Take 1 tablet by mouth  daily., Disp: 180 tablet, Rfl: 3;  warfarin (COUMADIN) 5 MG tablet, TAKE 2,2,1.5 AND CONTINUE THE SEQUENCE, Disp: 100 tablet, Rfl: 3  EXAM:  Filed Vitals:   02/22/12 0923  BP: 110/60  Pulse: 79  Temp: 99 F (37.2 C)    There is no height on file to calculate BMI.  GENERAL: vitals reviewed and listed below, alert, oriented, appears well hydrated and in no acute distress  HEENT: atraumatic, conjucntiva clear, no obvious abnormalities on inspection of external nose and ears  NECK: no masses on inspection  LUNGS: clear to auscultation bilaterally, no wheezes, rales or rhonchi, good air movement  CV: HRRR, no peripheral edema  MS: moves all extremities without noticeable abnormality the L knee flexion ext limited somewhat by pain/swelling  SKIN: large area of ecchymosis L LE, large area of swelling, erythema and warmth over L knee region.  PSYCH: pleasant and cooperative, no obvious depression or anxiety  ASSESSMENT AND PLAN:  Discussed the following assessment and plan:  1. Cellulitis  -appears to have cellulitis over L knee with systemic symptoms -feel given age and systemic symptoms should be evaluated in ED  -pt refused EMS transport and insisted on driving to the ED -ED alerted    No orders of the defined types were placed in this encounter.    There are no Patient Instructions on file for this visit.  Return to clinic immediately if symptoms worsen or persist or new concerns.  No Follow-up on file.  Tara Basque R.

## 2012-02-22 NOTE — ED Notes (Signed)
Patient states she suffered a fall back on February 13, 2012 and was seen by Dr. Rennis Chris. She presents today with knee pain, swelling, bruising to her left knee that she states looks better but the pain is worse. The area is warm to touch, red and has a quarter size scab in place. No drainage at this time.

## 2012-02-22 NOTE — ED Notes (Signed)
Pt is on coumadin and fell last week.  Pt has abrasion to left knee and has pain, swelling and warmth to knee and sent here for possible cellulitis.  Distal pulses present

## 2012-02-25 ENCOUNTER — Ambulatory Visit (INDEPENDENT_AMBULATORY_CARE_PROVIDER_SITE_OTHER): Payer: Medicare Other | Admitting: Family Medicine

## 2012-02-25 ENCOUNTER — Other Ambulatory Visit: Payer: Self-pay | Admitting: *Deleted

## 2012-02-25 ENCOUNTER — Ambulatory Visit (INDEPENDENT_AMBULATORY_CARE_PROVIDER_SITE_OTHER): Payer: Medicare Other | Admitting: Family

## 2012-02-25 VITALS — BP 120/56 | HR 64 | Temp 98.7°F | Wt 162.0 lb

## 2012-02-25 DIAGNOSIS — L0291 Cutaneous abscess, unspecified: Secondary | ICD-10-CM

## 2012-02-25 DIAGNOSIS — I4891 Unspecified atrial fibrillation: Secondary | ICD-10-CM

## 2012-02-25 DIAGNOSIS — L0390 Cellulitis, unspecified: Secondary | ICD-10-CM

## 2012-02-25 LAB — CBC WITH DIFFERENTIAL/PLATELET
Basophils Absolute: 0 10*3/uL (ref 0.0–0.1)
Basophils Relative: 0.5 % (ref 0.0–3.0)
Eosinophils Absolute: 0.2 10*3/uL (ref 0.0–0.7)
Eosinophils Relative: 2.3 % (ref 0.0–5.0)
HCT: 31 % — ABNORMAL LOW (ref 36.0–46.0)
Hemoglobin: 10.4 g/dL — ABNORMAL LOW (ref 12.0–15.0)
Lymphocytes Relative: 12.8 % (ref 12.0–46.0)
Lymphs Abs: 0.9 10*3/uL (ref 0.7–4.0)
MCHC: 33.4 g/dL (ref 30.0–36.0)
MCV: 93.2 fl (ref 78.0–100.0)
Monocytes Absolute: 0.7 10*3/uL (ref 0.1–1.0)
Monocytes Relative: 9.1 % (ref 3.0–12.0)
Neutro Abs: 5.4 10*3/uL (ref 1.4–7.7)
Neutrophils Relative %: 75.3 % (ref 43.0–77.0)
Platelets: 314 10*3/uL (ref 150.0–400.0)
RBC: 3.33 Mil/uL — ABNORMAL LOW (ref 3.87–5.11)
RDW: 15 % — ABNORMAL HIGH (ref 11.5–14.6)
WBC: 7.2 10*3/uL (ref 4.5–10.5)

## 2012-02-25 LAB — POCT INR: INR: 3.2

## 2012-02-25 MED ORDER — CLINDAMYCIN HCL 150 MG PO CAPS: 300.0000 mg | ORAL_CAPSULE | Freq: Three times a day (TID) | ORAL | Status: DC

## 2012-02-25 MED ORDER — LOSARTAN POTASSIUM-HCTZ 100-12.5 MG PO TABS: 1.0000 | ORAL_TABLET | Freq: Every day | ORAL | Status: DC

## 2012-02-25 NOTE — Patient Instructions (Addendum)
-  elevate leg for 30 minutes 3x per day  -take antibiotic as directed - make sure you are drinking plenty of fluids and let us know immediately if fevers, diarrhea, worsening appearance of redness on leg or other concerns  -follow up in 1 week

## 2012-02-25 NOTE — Progress Notes (Signed)
Chief Complaint  Patient presents with  . Follow-up    leg    HPI:  Ms Neglia is an 78 yo F with a series of unfortunate events recently. She had a fall a few weeks ago then was found to be in A. Fib with RVR and subtherapeutic on her coumadin. She did undergo cardioversion in the hospital. She then presented on 10/1 with erythema and warmth around recent wound on leg after a fall. She was evaluated in the ED with labs and plain films and oral clindamycin was initiated. She continues to feel like she is running a fever at time with chills, fatigue and mild anorexia but feeling somewhat better then a few days ago. Feels like leg has improved some on antibiotics. On day 4 of abx.   ROS: See pertinent positives and negatives per HPI.  Past Medical History  Diagnosis Date  . Personal history of malignant neoplasm of breast   . Personal history of other disorders of nervous system and sense organs   . Unspecified closed fracture of pelvis   . Closed fracture of unspecified part of femur   . Encounter for long-term (current) use of anticoagulants   . Atrial fibrillation     Paroxysmal, infrequent, Coumadin  . Hyperlipidemia   . Osteoporosis   . Arthritis   . Hypertension   . Diabetes mellitus   . Diverticulosis   . Diverticulitis   . Syncope   . Chest pain     Nuclear, April, 2008, no ischemia,  . Ejection fraction     EF 60%, echo, February, 2008  . Aortic valve sclerosis     Echo, 2008  . Warfarin anticoagulation   . Bradycardia     October, 2012    Family History  Problem Relation Age of Onset  . Heart failure Mother 21  . Heart attack Father 43  . Diabetes Father   . Colon cancer Neg Hx     History   Social History  . Marital Status: Married    Spouse Name: N/A    Number of Children: 2  . Years of Education: N/A   Occupational History  . Piano Teacher    Social History Main Topics  . Smoking status: Never Smoker   . Smokeless tobacco: Never Used  . Alcohol  Use: No  . Drug Use: No  . Sexually Active: Not Currently   Other Topics Concern  . Not on file   Social History Narrative  . No narrative on file    Current outpatient prescriptions:ALPRAZolam (XANAX) 0.5 MG tablet, Take 0.5 mg by mouth at bedtime as needed. For sleep, Disp: , Rfl: ;  Ascorbic Acid (VITAMIN C PO), Take 1 tablet by mouth daily., Disp: , Rfl: ;  b complex vitamins tablet, Take 1 tablet by mouth daily.  , Disp: , Rfl: ;  Calcium Carbonate-Vitamin D (CALTRATE 600+D) 600-400 MG-UNIT per tablet, Take 1 tablet by mouth daily.  , Disp: , Rfl:  Cholecalciferol (VITAMIN D) 2000 UNITS tablet, Take 2,000 Units by mouth daily.  , Disp: , Rfl: ;  clindamycin (CLEOCIN) 150 MG capsule, Take 2 capsules (300 mg total) by mouth 3 (three) times daily., Disp: 42 capsule, Rfl: 0;  digoxin (LANOXIN) 0.125 MG tablet, Take 125 mcg by mouth daily., Disp: , Rfl: ;  diltiazem (CARDIZEM CD) 240 MG 24 hr capsule, Take 240 mg by mouth daily., Disp: , Rfl:  glucose blood (TRUETEST TEST) test strip, 1 each by Other route 2 (  two) times daily. Use as instructed , Disp: , Rfl: ;  losartan-hydrochlorothiazide (HYZAAR) 100-12.5 MG per tablet, Take 1 tablet by mouth daily., Disp: , Rfl: ;  Magnesium 250 MG TABS, Take 1 tablet by mouth daily.  , Disp: , Rfl: ;  metoprolol tartrate (LOPRESSOR) 25 MG tablet, Take 25 mg by mouth 2 (two) times daily., Disp: , Rfl:  Multiple Vitamin (MULTIVITAMIN) tablet, Take 1 tablet by mouth daily.  , Disp: , Rfl: ;  pioglitazone (ACTOS) 45 MG tablet, Take 45 mg by mouth daily.  , Disp: , Rfl: ;  potassium chloride (KLOR-CON) 8 MEQ tablet, Take 8 mEq by mouth daily., Disp: , Rfl: ;  raloxifene (EVISTA) 60 MG tablet, Take 60 mg by mouth daily., Disp: , Rfl: ;  rosuvastatin (CRESTOR) 5 MG tablet, Take 5 mg by mouth 3 (three) times a week. Mon, Weds & Fri, Disp: , Rfl:  Saxagliptin-Metformin 2.09-998 MG TB24, Take 1 tablet by mouth daily., Disp: , Rfl: ;  warfarin (COUMADIN) 5 MG tablet, Take  7.5-10 mg by mouth. Takes 10 mg on Mon, Weds, & Fri and 7.5 mg all other days of the week, Disp: , Rfl:   EXAM:  Filed Vitals:   02/25/12 1053  BP: 120/56  Pulse: 64  Temp: 98.7 F (37.1 C)    There is no height on file to calculate BMI.  GENERAL: vitals reviewed and listed below, alert, oriented, appears well hydrated and in no acute distress  HEENT: atraumatic, conjucntiva clear, no obvious abnormalities on inspection of external nose and ears  NECK: no masses on inspection  LUNGS: clear to auscultation bilaterally, no wheezes, rales or rhonchi, good air movement  CV: HRRR, no peripheral edema  SKIN: erythema and swelling around skin tear in left knee area - though swelling somewhat improved from last visit. Improving ecchymosis L LE. Skin   MS: moves all extremities without noticeable abnormality  PSYCH: pleasant and cooperative, no obvious depression or anxiety  ASSESSMENT AND PLAN:  Discussed the following assessment and plan:  1. Cellulitis  CBC with Differential  -my exam of this leg at last visit was c/w cellulitis and exam today c/w improving cellulitis, pt seen in ED and reports was told probably wasn't cellulitis since white count was normal. I explained that in elderly patients in particular - white count is not elevate even in serious infections. Pt reports she really thinks leg became infected and she was feeling unwell, but is feeling a little better on the antibiotics. We will continue the antibiotics and provided strict return/emergency precautions. Vital signs normal.  -clinda unlikely to interfere with coumadin therapy, but seen by coumadin clinic today to check INR.  -follow-up next week or sooner if any concerns  Orders Placed This Encounter  Procedures  . CBC with Differential    Patient Instructions  -elevate leg for 30 minutes 3x per day  -take antibiotic as directed - make sure you are drinking plenty of fluids and let us know immediately if  fevers, diarrhea, worsening appearance of redness on leg or other concerns  -follow up in 1 week    Return to clinic immediately if symptoms worsen or persist or new concerns.  No Follow-up on file.  Kriste Basque R.

## 2012-02-25 NOTE — Patient Instructions (Addendum)
Take 7.5mg  today. Then Continue 10 mg Monday, Wednesday, Friday. Then 7.5 mg every other day. Check in 3 weeks.     Latest dosing instructions   Total Tara Bradley Tue Wed Thu Fri Sat   60 7.5 mg 10 mg 7.5 mg 10 mg 7.5 mg 10 mg 7.5 mg    (5 mg1.5) (5 mg2) (5 mg1.5) (5 mg2) (5 mg1.5) (5 mg2) (5 mg1.5)

## 2012-03-01 ENCOUNTER — Ambulatory Visit (INDEPENDENT_AMBULATORY_CARE_PROVIDER_SITE_OTHER): Payer: Medicare Other | Admitting: Family Medicine

## 2012-03-01 ENCOUNTER — Encounter: Payer: Medicare Other | Admitting: Family

## 2012-03-01 ENCOUNTER — Encounter: Payer: Self-pay | Admitting: Family Medicine

## 2012-03-01 VITALS — Temp 98.6°F | Wt 155.0 lb

## 2012-03-01 DIAGNOSIS — L0291 Cutaneous abscess, unspecified: Secondary | ICD-10-CM

## 2012-03-01 DIAGNOSIS — L0390 Cellulitis, unspecified: Secondary | ICD-10-CM

## 2012-03-01 NOTE — Patient Instructions (Signed)
-  stop antibiotic on Thursday (in one day)  -elevate leg two times daily for 30-90 minutes with compress  -use compression stockings  -follow up in 7-10 days or sooner if looks like it is worsening or redness starts to spread or feeling unwell

## 2012-03-01 NOTE — Progress Notes (Signed)
Chief Complaint  Patient presents with  . 1 week f/u    HPI:  Here for follow up cellulitis L LE: -doing better on clinda day 9 -denies fevers, chills, malaise, nausea -erythema and tenderness subsiding ROS: See pertinent positives and negatives per HPI.  Past Medical History  Diagnosis Date  . Personal history of malignant neoplasm of breast   . Personal history of other disorders of nervous system and sense organs   . Unspecified closed fracture of pelvis   . Closed fracture of unspecified part of femur   . Long term (current) use of anticoagulants   . Atrial fibrillation     Paroxysmal, infrequent, Coumadin  . Hyperlipidemia   . Osteoporosis   . Arthritis   . Hypertension   . Diabetes mellitus   . Diverticulosis   . Diverticulitis   . Syncope   . Chest pain     Nuclear, April, 2008, no ischemia,  . Ejection fraction     EF 60%, echo, February, 2008  . Aortic valve sclerosis     Echo, 2008  . Warfarin anticoagulation   . Bradycardia     October, 2012    Family History  Problem Relation Age of Onset  . Heart failure Mother 69  . Heart attack Father 2  . Diabetes Father   . Colon cancer Neg Hx     History   Social History  . Marital Status: Married    Spouse Name: N/A    Number of Children: 2  . Years of Education: N/A   Occupational History  . Piano Teacher    Social History Main Topics  . Smoking status: Never Smoker   . Smokeless tobacco: Never Used  . Alcohol Use: No  . Drug Use: No  . Sexually Active: Not Currently   Other Topics Concern  . None   Social History Narrative  . None    Current outpatient prescriptions:ALPRAZolam (XANAX) 0.5 MG tablet, Take 0.5 mg by mouth at bedtime as needed. For sleep, Disp: , Rfl: ;  Ascorbic Acid (VITAMIN C PO), Take 1 tablet by mouth daily., Disp: , Rfl: ;  b complex vitamins tablet, Take 1 tablet by mouth daily.  , Disp: , Rfl: ;  Calcium Carbonate-Vitamin D (CALTRATE 600+D) 600-400 MG-UNIT per  tablet, Take 1 tablet by mouth daily.  , Disp: , Rfl:  Cholecalciferol (VITAMIN D) 2000 UNITS tablet, Take 2,000 Units by mouth daily.  , Disp: , Rfl: ;  clindamycin (CLEOCIN) 150 MG capsule, Take 2 capsules (300 mg total) by mouth 3 (three) times daily., Disp: 42 capsule, Rfl: 0;  digoxin (LANOXIN) 0.125 MG tablet, Take 125 mcg by mouth daily., Disp: , Rfl: ;  diltiazem (CARDIZEM CD) 240 MG 24 hr capsule, Take 240 mg by mouth daily., Disp: , Rfl:  glucose blood (TRUETEST TEST) test strip, 1 each by Other route 2 (two) times daily. Use as instructed , Disp: , Rfl: ;  losartan-hydrochlorothiazide (HYZAAR) 100-12.5 MG per tablet, Take 1 tablet by mouth daily., Disp: 90 tablet, Rfl: 3;  Magnesium 250 MG TABS, Take 1 tablet by mouth daily.  , Disp: , Rfl: ;  metoprolol tartrate (LOPRESSOR) 25 MG tablet, Take 25 mg by mouth 2 (two) times daily., Disp: , Rfl:  Multiple Vitamin (MULTIVITAMIN) tablet, Take 1 tablet by mouth daily.  , Disp: , Rfl: ;  pioglitazone (ACTOS) 45 MG tablet, Take 45 mg by mouth daily.  , Disp: , Rfl: ;  potassium chloride (KLOR-CON) 8  MEQ tablet, Take 8 mEq by mouth daily., Disp: , Rfl: ;  raloxifene (EVISTA) 60 MG tablet, Take 60 mg by mouth daily., Disp: , Rfl: ;  rosuvastatin (CRESTOR) 5 MG tablet, Take 5 mg by mouth 3 (three) times a week. Mon, Weds & Fri, Disp: , Rfl:  Saxagliptin-Metformin 2.09-998 MG TB24, Take 1 tablet by mouth daily., Disp: , Rfl: ;  warfarin (COUMADIN) 5 MG tablet, Take 7.5-10 mg by mouth. Takes 10 mg on Mon, Weds, & Fri and 7.5 mg all other days of the week, Disp: , Rfl:   EXAM:  Filed Vitals:   03/01/12 1017  Temp: 98.6 F (37 C)    There is no height on file to calculate BMI.  GENERAL: vitals reviewed and listed above, alert, oriented, appears well hydrated and in no acute distress  HEENT: atraumatic, conjunttiva clear, no obvious abnormalities on inspection of external nose and ears  NECK: no obvious masses on inspection  SKIN: much improved  appearance of redness and swelling in area of L upper tibial region  MS: moves all extremities without noticeable abnormality  PSYCH: pleasant and cooperative, no obvious depression or anxiety  ASSESSMENT AND PLAN:  Discussed the following assessment and plan:  1. Cellulitis    -improving -Patient advised to return to notify a doctor immediately if symptoms worsen or persist or new concerns arise.  Patient Instructions  -stop antibiotic on Thursday (in one day)  -elevate leg two times daily for 30-90 minutes with compress  -use compression stockings  -follow up in 7-10 days or sooner if looks like it is worsening or redness starts to spread or feeling unwell     Josue Falconi R.

## 2012-03-03 ENCOUNTER — Ambulatory Visit: Payer: Medicare Other | Admitting: Family Medicine

## 2012-03-07 ENCOUNTER — Encounter: Payer: Self-pay | Admitting: Cardiology

## 2012-03-07 ENCOUNTER — Emergency Department (HOSPITAL_COMMUNITY): Payer: Medicare Other

## 2012-03-07 ENCOUNTER — Emergency Department (HOSPITAL_COMMUNITY)
Admission: EM | Admit: 2012-03-07 | Discharge: 2012-03-08 | Disposition: A | Discharge status: Home / Self Care | Payer: Medicare Other | Attending: Emergency Medicine | Admitting: Emergency Medicine

## 2012-03-07 DIAGNOSIS — S5292XA Unspecified fracture of left forearm, initial encounter for closed fracture: Secondary | ICD-10-CM

## 2012-03-07 DIAGNOSIS — S52509A Unspecified fracture of the lower end of unspecified radius, initial encounter for closed fracture: Secondary | ICD-10-CM | POA: Insufficient documentation

## 2012-03-07 DIAGNOSIS — Z7901 Long term (current) use of anticoagulants: Secondary | ICD-10-CM | POA: Insufficient documentation

## 2012-03-07 DIAGNOSIS — S52609A Unspecified fracture of lower end of unspecified ulna, initial encounter for closed fracture: Secondary | ICD-10-CM | POA: Insufficient documentation

## 2012-03-07 DIAGNOSIS — W010XXA Fall on same level from slipping, tripping and stumbling without subsequent striking against object, initial encounter: Secondary | ICD-10-CM | POA: Insufficient documentation

## 2012-03-07 DIAGNOSIS — R9430 Abnormal result of cardiovascular function study, unspecified: Secondary | ICD-10-CM | POA: Insufficient documentation

## 2012-03-07 DIAGNOSIS — R791 Abnormal coagulation profile: Secondary | ICD-10-CM | POA: Insufficient documentation

## 2012-03-07 DIAGNOSIS — E119 Type 2 diabetes mellitus without complications: Secondary | ICD-10-CM | POA: Insufficient documentation

## 2012-03-07 DIAGNOSIS — I10 Essential (primary) hypertension: Secondary | ICD-10-CM | POA: Insufficient documentation

## 2012-03-07 LAB — POCT I-STAT, CHEM 8
BUN: 32 mg/dL — ABNORMAL HIGH (ref 6–23)
Calcium, Ion: 1.19 mmol/L (ref 1.13–1.30)
Chloride: 100 mEq/L (ref 96–112)
Creatinine, Ser: 1.1 mg/dL (ref 0.50–1.10)
Glucose, Bld: 252 mg/dL — ABNORMAL HIGH (ref 70–99)
HCT: 35 % — ABNORMAL LOW (ref 36.0–46.0)
Hemoglobin: 11.9 g/dL — ABNORMAL LOW (ref 12.0–15.0)
Potassium: 3.8 mEq/L (ref 3.5–5.1)
Sodium: 138 mEq/L (ref 135–145)
TCO2: 27 mmol/L (ref 0–100)

## 2012-03-07 LAB — URINALYSIS, ROUTINE W REFLEX MICROSCOPIC
Bilirubin Urine: NEGATIVE
Glucose, UA: 500 mg/dL — AB
Hgb urine dipstick: NEGATIVE
Ketones, ur: NEGATIVE mg/dL
Nitrite: NEGATIVE
Protein, ur: NEGATIVE mg/dL
Specific Gravity, Urine: 1.022 (ref 1.005–1.030)
Urobilinogen, UA: 0.2 mg/dL (ref 0.0–1.0)
pH: 7 (ref 5.0–8.0)

## 2012-03-07 LAB — APTT: aPTT: 50 seconds — ABNORMAL HIGH (ref 24–37)

## 2012-03-07 LAB — URINE MICROSCOPIC-ADD ON

## 2012-03-07 LAB — PROTIME-INR
INR: 1.59 — ABNORMAL HIGH (ref 0.00–1.49)
Prothrombin Time: 18.5 seconds — ABNORMAL HIGH (ref 11.6–15.2)

## 2012-03-07 MED ORDER — ENOXAPARIN SODIUM 80 MG/0.8ML ~~LOC~~ SOLN
1.0000 mg/kg | Freq: Once | SUBCUTANEOUS | Status: AC
  Administered 2012-03-07: 70 mg via SUBCUTANEOUS
  Filled 2012-03-07: qty 0.8

## 2012-03-07 MED ORDER — ACETAMINOPHEN 325 MG PO TABS
650.0000 mg | ORAL_TABLET | Freq: Once | ORAL | Status: AC
  Administered 2012-03-07: 650 mg via ORAL
  Filled 2012-03-07: qty 2

## 2012-03-07 MED ORDER — HEPARIN (PORCINE) IN NACL 100-0.45 UNIT/ML-% IJ SOLN: 900.0000 [IU]/h | INTRAMUSCULAR | Status: DC

## 2012-03-07 NOTE — ED Notes (Signed)
Pt states that she was walking, tripped, and landed on her left wrist.  C/o pain 9/10.  Deformity noted.

## 2012-03-07 NOTE — ED Provider Notes (Signed)
History     CSN: [id number]  Arrival date & time 03/07/12  1749   First MD Initiated Contact with Patient 03/07/12 2002      Chief Complaint  Patient presents with  . Wrist Pain    (Consider location/radiation/quality/duration/timing/severity/associated sxs/prior treatment) HPI History provided by pt.   Pt was walking in new shoes that her too large for her, her ankle twisted and caused her to fall forward, landing on an outstretched left hand.  Denies having dizziness or lightheadedness prior to fall and no recent infectious sx such as fever, cough, SOB, N/V/D or dysuria.  Did not hit her head and denies neck/back pain.  C/o deformity, pain and edema of left wrist.  No pain in elbow or shoulder.  No associated paresthesias.  Has broken this wrist in the past.  Anti-coagulated w/ coumadin.  Past Medical History  Diagnosis Date  . Personal history of malignant neoplasm of breast   . Personal history of other disorders of nervous system and sense organs   . Unspecified closed fracture of pelvis   . Closed fracture of unspecified part of femur   . Long term (current) use of anticoagulants   . Atrial fibrillation     Paroxysmal, infrequent, Coumadin  . Hyperlipidemia   . Osteoporosis   . Arthritis   . Hypertension   . Diabetes mellitus   . Diverticulosis   . Diverticulitis   . Syncope   . Chest pain     Nuclear, April, 2008, no ischemia,  . Ejection fraction     EF 60%, echo, February, 2008  //   EF 65-70%, echo, November, 2012  . Aortic valve sclerosis     Echo, 2008  . Warfarin anticoagulation   . Bradycardia     October, 2012    Past Surgical History  Procedure Date  . Abdominal hysterectomy 1995  . Shoulder surgery     left  . Mastectomy 1995    left  . Femur surgery   . Wrist surgery      x 2     Family History  Problem Relation Age of Onset  . Heart failure Mother 56  . Heart attack Father 71  . Diabetes Father   . Colon cancer Neg Hx     History    Substance Use Topics  . Smoking status: Never Smoker   . Smokeless tobacco: Never Used  . Alcohol Use: No    OB History    Grav Para Term Preterm Abortions TAB SAB Ect Mult Living                  Review of Systems  All other systems reviewed and are negative.    Allergies  Codeine and Codeine sulfate  Home Medications   Current Outpatient Rx  Name Route Sig Dispense Refill  . ALPRAZOLAM 0.5 MG PO TABS Oral Take 0.5 mg by mouth at bedtime as needed. For sleep    . VITAMIN C PO Oral Take 1 tablet by mouth daily.    . B COMPLEX-C PO TABS Oral Take 1 tablet by mouth daily.    [name] CALCIUM CARBONATE-VITAMIN D 600-400 MG-UNIT PO TABS Oral Take 1 tablet by mouth daily.      [name] VITAMIN D 2000 UNITS PO TABS Oral Take 2,000 Units by mouth daily.      [name] DIGOXIN 0.125 MG PO TABS Oral Take 125 mcg by mouth daily.    [name] DILTIAZEM HCL ER COATED BEADS 240  MG PO CP24 Oral Take 240 mg by mouth daily.    [name] LOSARTAN POTASSIUM-HCTZ 100-12.5 MG PO TABS Oral Take 1 tablet by mouth daily. 90 tablet 3  . MAGNESIUM 250 MG PO TABS Oral Take 1 tablet by mouth daily.      [name] METOPROLOL TARTRATE 25 MG PO TABS Oral Take 25 mg by mouth 2 (two) times daily.    . ADULT MULTIVITAMIN W/MINERALS CH Oral Take 1 tablet by mouth daily.    [name] PIOGLITAZONE HCL 45 MG PO TABS Oral Take 45 mg by mouth daily.      [name] POTASSIUM CHLORIDE ER 8 MEQ PO TBCR Oral Take 8 mEq by mouth daily.    [name] RALOXIFENE HCL 60 MG PO TABS Oral Take 60 mg by mouth daily.    [name] ROSUVASTATIN CALCIUM 5 MG PO TABS Oral Take 5 mg by mouth 3 (three) times a week. Mon, Weds & Fri    . SAXAGLIPTIN-METFORMIN ER 2.09-998 MG PO TB24 Oral Take 1 tablet by mouth daily.    . WARFARIN SODIUM 5 MG PO TABS Oral Take 7.5-10 mg by mouth daily. Takes 10 mg on Mon, Weds, & Fri and 7.5 mg all other days of the week    . CLINDAMYCIN HCL 150 MG PO CAPS Oral Take 2 capsules (300 mg total) by mouth 3 (three) times daily. 42 capsule 0  . GLUCOSE BLOOD VI STRP Other 1 each by  Other route 2 (two) times daily. Use as instructed       BP 150/55  Pulse 68  Temp 98.5 F (36.9 C) (Oral)  Resp 18  SpO2 100%  Physical Exam  Nursing note and vitals reviewed. Constitutional: She is oriented to person, place, and time. She appears well-developed and well-nourished. No distress.  HENT:  Head: Normocephalic and atraumatic.  Eyes:       Normal appearance  Neck: Normal range of motion.  Cardiovascular: Normal rate and regular rhythm.   Pulmonary/Chest: Effort normal and breath sounds normal. No respiratory distress.  Musculoskeletal: Normal range of motion.       Left wrist w/ obvious deformity.  Edema and ecchymosis.  Tenderness distal ulna and radius.  Mild pain w/ passive ROM.  Nml hand, elbow and shoulder.  2+ radial pulse and distal sensation intact.      Neurological: She is alert and oriented to person, place, and time.  Skin: Skin is warm and dry. No rash noted.  Psychiatric: She has a normal mood and affect. Her behavior is normal.    ED Course  Procedures (including critical care time)  Labs Reviewed  PROTIME-INR - Abnormal; Notable for the following:    Prothrombin Time 18.5 (*)     INR 1.59 (*)     All other components within normal limits  URINALYSIS, ROUTINE W REFLEX MICROSCOPIC - Abnormal; Notable for the following:    Glucose, UA 500 (*)     Leukocytes, UA SMALL (*)     All other components within normal limits  POCT I-STAT, CHEM 8 - Abnormal; Notable for the following:    BUN 32 (*)     Glucose, Bld 252 (*)     Hemoglobin 11.9 (*)     HCT 35.0 (*)     All other components within normal limits  URINE MICROSCOPIC-ADD ON - Abnormal; Notable for the following:    Squamous Epithelial / LPF FEW (*)     All other components within normal limits  APTT - Abnormal; Notable  for the following:    aPTT 50 (*)     All other components within normal limits   Dg Wrist Complete Left  03/07/2012  *RADIOLOGY REPORT*  Clinical Data: Larey Seat.  Left wrist  pain.  LEFT WRIST - COMPLETE 3+ VIEW  Comparison: None  Findings: There is a comminuted intra-articular fracture of the distal radius with dorsal impaction.  There is also a comminuted avulsion fracture of the ulnar styloid.  The scapholunate joint space is widened and could be due to ligament tear or ligament insufficiency.  Chondrocalcinosis is noted.  IMPRESSION:  1.  Comminuted intra-articular distal radius fracture with dorsal impaction. 2.  Ulnar styloid avulsion fracture. 3.  Widened scapholunate joint suggesting ligamentous injury or insufficiency.   Original Report Authenticated By: P. Loralie Champagne, M.D.      1. Fracture of left radius   2. Subtherapeutic international normalized ratio (INR)       MDM  78yo F, anti-coagulated w/ coumadin for atrial fibrillation, presents w/ left wrist pain, s/p what sounds to be a mechanical fall on an outstretched hand.  Obvious deformity of L wrist on exam.  NV intact.  Xray and labs, including chem-8, U/A and INR are pending.  Pt to receive tylenol for pain.  8:36 PM   Xray shows comminuted intra-articular fx left distal radius w/ dorsal impaction.  Labs sig for hyperglycemia (pt has missed afternoon and evening dose of diabetes medications) and sub-therapeutic INR.  Spoke w/ Dr. Charm Barges w/ [facility] and he recommends that pt d/c coumadin and take lovenox until after her procedure.  Pt received first dose in ED.  Consulted Dr. Lestine Box with [place] [facility] and he is comfortable w/ this plan and recommends sugar tong splint (applied by ortho tech) and follow up in office tomorrow.  Pt made aware of plan and d/c'd home. 12:03 AM        Otilio Miu, PA 03/08/12 0003

## 2012-03-07 NOTE — Progress Notes (Deleted)
ANTICOAGULATION CONSULT NOTE - Initial Consult  Pharmacy Consult for Heparin Indication: atrial fibrillation  Allergies  Allergen Reactions  . Codeine Other (See Comments)    headache  . Codeine Sulfate     REACTION: unspecified    Patient Measurements: Height: 5' 4.17" (163 cm) Weight: 154 lb 15.7 oz (70.3 kg) IBW/kg (Calculated) : 55.1  Heparin wt = 60 kg  Vital Signs: Temp: 98.5 F (36.9 C) (10/15 1805) Temp src: Oral (10/15 1805) BP: 150/55 mmHg (10/15 1805) Pulse Rate: 68  (10/15 1805)  Labs:  [facility] 03/07/12 2042 03/07/12 2035  HGB 11.9* --  HCT 35.0* --  PLT -- --  APTT -- --  LABPROT -- 18.5*  INR -- 1.59*  HEPARINUNFRC -- --  CREATININE 1.10 --  CKTOTAL -- --  CKMB -- --  TROPONINI -- --    Estimated Creatinine Clearance: 34.8 ml/min (by C-G formula based on Cr of 1.1).   Medical History: Past Medical History  Diagnosis Date  . Personal history of malignant neoplasm of breast   . Personal history of other disorders of nervous system and sense organs   . Unspecified closed fracture of pelvis   . Closed fracture of unspecified part of femur   . Long term (current) use of anticoagulants   . Atrial fibrillation     Paroxysmal, infrequent, Coumadin  . Hyperlipidemia   . Osteoporosis   . Arthritis   . Hypertension   . Diabetes mellitus   . Diverticulosis   . Diverticulitis   . Syncope   . Chest pain     Nuclear, April, 2008, no ischemia,  . Ejection fraction     EF 60%, echo, February, 2008  //   EF 65-70%, echo, November, 2012  . Aortic valve sclerosis     Echo, 2008  . Warfarin anticoagulation   . Bradycardia     October, 2012    Medications:  Scheduled:    . acetaminophen  650 mg Oral Once   Infusions:    Assessment: 78 yo admitted s/p fall with wrist injury.  Pt on chronic coumadin for A-fib.  Coumadin being held for pending surgery MD wants to bridge with heparin drip.  Todays INR = 1.59. Goal of Therapy:  Heparin level  0.3-0.7 units/ml Monitor platelets by anticoagulation protocol: Yes   Plan:   Stat Ptt.  Start heparin drip @ 900 units/hr. (No bolus)  Daily HL and CBC.  Check 1st HL in 8 hours.  Susanne Greenhouse R 03/07/2012,10:45 PM

## 2012-03-07 NOTE — ED Provider Notes (Addendum)
Tripped and fell today injuring left breast. No other injury. On exam left upper extremity skin intact deformity or wrist radial pulse 2+. Patient noted to be subtherapeutic on Coumadin.. to be eva\luated by or take surgeon tomorrow. Tara Bradley will call cardiologisin order to determine whether to adjust Coumadin dose or 2 of hold Coumadin Wrist x-ray reviewed by me Doug Sou, MD 03/07/12 2211  Doug Sou, MD 03/07/12 2212

## 2012-03-07 NOTE — ED Notes (Signed)
Ortho tech notified.  

## 2012-03-07 NOTE — ED Notes (Signed)
Pt family updated on POC, Rx notified for orders clarification, awaiting medication to D/C

## 2012-03-07 NOTE — Progress Notes (Signed)
Brief note from [employer] w/ PA Schinlever- pt going home wants x1 Lovenox.  Lorenza Evangelist 03/07/2012 11:23 PM

## 2012-03-08 ENCOUNTER — Ambulatory Visit: Payer: Medicare Other | Admitting: Cardiology

## 2012-03-08 MED ORDER — HYDROCODONE-ACETAMINOPHEN 5-500 MG PO TABS: 1.0000 | ORAL_TABLET | Freq: Four times a day (QID) | ORAL | Status: DC | PRN

## 2012-03-08 MED ORDER — ENOXAPARIN SODIUM 80 MG/0.8ML ~~LOC~~ SOLN: 70.0000 mg | Freq: Two times a day (BID) | SUBCUTANEOUS | Status: DC

## 2012-03-08 NOTE — ED Provider Notes (Signed)
Medical screening examination/treatment/procedure(s) were conducted as a shared visit with non-physician practitioner(s) and myself.  I personally evaluated the patient during the encounter  Doug Sou, MD 03/08/12 [phone]

## 2012-03-09 ENCOUNTER — Encounter: Payer: Self-pay | Admitting: Physician Assistant

## 2012-03-09 ENCOUNTER — Ambulatory Visit (INDEPENDENT_AMBULATORY_CARE_PROVIDER_SITE_OTHER): Payer: Medicare Other | Admitting: Physician Assistant

## 2012-03-09 VITALS — BP 130/64 | HR 53 | Ht 65.5 in | Wt 160.0 lb

## 2012-03-09 DIAGNOSIS — I4891 Unspecified atrial fibrillation: Secondary | ICD-10-CM

## 2012-03-09 DIAGNOSIS — I498 Other specified cardiac arrhythmias: Secondary | ICD-10-CM

## 2012-03-09 DIAGNOSIS — R001 Bradycardia, unspecified: Secondary | ICD-10-CM

## 2012-03-09 LAB — URINE CULTURE: Colony Count: 4000

## 2012-03-09 MED ORDER — METOPROLOL TARTRATE 25 MG PO TABS: 25.0000 mg | ORAL_TABLET | Freq: Two times a day (BID) | ORAL | Status: DC

## 2012-03-09 MED ORDER — ENOXAPARIN SODIUM 80 MG/0.8ML ~~LOC~~ SOLN: 70.0000 mg | Freq: Two times a day (BID) | SUBCUTANEOUS | Status: DC

## 2012-03-09 MED ORDER — DIGOXIN 125 MCG PO TABS: 0.0625 mg | ORAL_TABLET | Freq: Every day | ORAL | Status: DC

## 2012-03-09 NOTE — Progress Notes (Addendum)
[address] [place], [place]  [address] Phone: [phone] Fax:  [phone]  Date:  03/09/2012   Name:  Tara Bradley   DOB:  Jan 07, 1923   MRN:  [id number]  PCP:  Carrie Mew, MD  Primary Cardiologist:  Dr. Zackery Barefoot  Primary Electrophysiologist:  None    History of Present Illness: Tara Bradley is a 78 y.o. female who returns for follow up after recent DCCV.  She has a history of paroxysmal atrial fibrillation, HL, HTN, DM2.  She was evaluated in the emergency room 02/16/12 with atrial fibrillation with RVR.  Of note, she had a recent fall at home and hurt her left leg.  States someone accidentally tripped her with their cane.  It was felt that her atrial fibrillation was less than 48 hours duration. Her INR was subtherapeutic. Patient was cardioverted and discharged to home.  Upon review of her chart, she was seen in followup with her PCP and was noted to have cellulitis of her injured leg. She was treated with antibiotics. She was then seen back in the ED 2 days ago after another fall. She now has a left wrist (distal radial) fracture. She apparently needs ORIF. According to the notes in the emergency room, her INR was subtherapeutic and the ER physician spoke with the on-call cardiology fellow who recommended Lovenox until after her procedure.    She has seen Dr. Amanda Pea in follow up.  He has decided not to do surgery at this point in time. She is now in a hard cast. The plan is to follow her weekly over the next 8 weeks to see if she is healing appropriately. She is now on Lovenox but has missed Coumadin starting 3 days ago. INR was 1.6 on 10/15. She denies chest pain, significant shortness of breath, orthopnea, PND or significant pedal edema. Her left leg is still healing.  Labs (10/11):  TSH 4.38 Venous Dopplers (9/13):  No left leg DVT Labs (10/13):  K 3.8, creatinine 1.10, Hgb 11.9,    Wt Readings from Last 3 Encounters:  03/07/12 154 lb  15.7 oz (70.3 kg)  03/01/12 155 lb (70.308 kg)  02/25/12 162 lb (73.483 kg)     Past Medical History  Diagnosis Date  . Personal history of malignant neoplasm of breast   . Personal history of other disorders of nervous system and sense organs   . Unspecified closed fracture of pelvis   . Closed fracture of unspecified part of femur   . Long term (current) use of anticoagulants   . Atrial fibrillation     Paroxysmal, infrequent, Coumadin  . Hyperlipidemia   . Osteoporosis   . Arthritis   . Hypertension   . Diabetes mellitus   . Diverticulosis   . Diverticulitis   . Syncope   . Chest pain     Nuclear, April, 2008, no ischemia,  . Ejection fraction     EF 60%, echo, February, 2008  //   EF 65-70%, echo, November, 2012  . Aortic valve sclerosis     Echo, 2008  . Warfarin anticoagulation   . Bradycardia     October, 2012    Current Outpatient Prescriptions  Medication Sig Dispense Refill  . ALPRAZolam (XANAX) 0.5 MG tablet Take 0.5 mg by mouth at bedtime as needed. For sleep      . Ascorbic Acid (VITAMIN C PO) Take 1 tablet by mouth daily.      . B Complex-C (B-COMPLEX  WITH VITAMIN C) tablet Take 1 tablet by mouth daily.      . Calcium Carbonate-Vitamin D (CALTRATE 600+D) 600-400 MG-UNIT per tablet Take 1 tablet by mouth daily.        . Cholecalciferol (VITAMIN D) 2000 UNITS tablet Take 2,000 Units by mouth daily.        . digoxin (LANOXIN) 0.125 MG tablet Take 125 mcg by mouth daily.      [name] diltiazem (CARDIZEM CD) 240 MG 24 hr capsule Take 240 mg by mouth daily.      [name] glucose blood (TRUETEST TEST) test strip 1 each by Other route 2 (two) times daily. Use as instructed       . HYDROcodone-acetaminophen (VICODIN) 5-500 MG per tablet Take 1-2 tablets by mouth every 6 (six) hours as needed for pain.  15 tablet  0  . losartan-hydrochlorothiazide (HYZAAR) 100-12.5 MG per tablet Take 1 tablet by mouth daily.  90 tablet  3  . Magnesium 250 MG TABS Take 1 tablet by mouth daily.         . metoprolol tartrate (LOPRESSOR) 25 MG tablet Take 25 mg by mouth 2 (two) times daily.      . Multiple Vitamin (MULTIVITAMIN WITH MINERALS) TABS Take 1 tablet by mouth daily.      . pioglitazone (ACTOS) 45 MG tablet Take 45 mg by mouth daily.        . potassium chloride (KLOR-CON) 8 MEQ tablet Take 8 mEq by mouth daily.      . raloxifene (EVISTA) 60 MG tablet Take 60 mg by mouth daily.      . rosuvastatin (CRESTOR) 5 MG tablet Take 5 mg by mouth 3 (three) times a week. Mon, Weds & Fri      . Saxagliptin-Metformin 2.09-998 MG TB24 Take 1 tablet by mouth daily.      [name] warfarin (COUMADIN) 5 MG tablet Take 7.5-10 mg by mouth daily. Takes 10 mg on Mon, Weds, & Fri and 7.5 mg all other days of the week      . enoxaparin (LOVENOX) 80 MG/0.8ML injection Inject 0.7 mLs (70 mg total) into the skin every 12 (twelve) hours.  6 Syringe  0    Allergies: Allergies  Allergen Reactions  . Codeine Other (See Comments)    headache  . Codeine Sulfate     REACTION: unspecified    History  Substance Use Topics  . Smoking status: Never Smoker   . Smokeless tobacco: Never Used  . Alcohol Use: No     ROS:  Please see the history of present illness.  She does feel lightheaded at times. She denies melena, hematochezia, hematuria, hemoptysis.  All other systems reviewed and negative.   PHYSICAL EXAM: VS:  BP 130/64  Pulse 53  Ht 5' 5.5" (1.664 m)  Wt 160 lb (72.576 kg)  BMI 26.22 kg/m2 Well nourished, well developed, in no acute distress HEENT: normal Neck: no JVD at 90 Cardiac:  normal S1, S2; RRR; 1/6 systolic murmur along the LSB Lungs:  clear to auscultation bilaterally, no wheezing, rhonchi or rales Abd: soft, nontender, no hepatomegaly Ext: no edema MSK: Hard cast noted over the left forearm Skin: warm and dry Neuro:  CNs 2-12 intact, no focal abnormalities noted  EKG:  Sinus bradycardia, HR 53, left axis deviation, LVH, repolarization abnormality, no change prior tracing       ASSESSMENT AND PLAN:  1. Atrial Fibrillation:   She is maintaining sinus rhythm since cardioversion 02/16/12. She was placed  on Lovenox for her broken wrist in anticipation of upcoming surgery to minimize her time off of anticoagulation in the 4 week post-cardioversion period. However, surgery is now being deferred. We checked her INR in the office today. It is 1.2. As she just had cardioversion recently, I will keep her on Lovenox. She will continue this through the weekend. I will give her Coumadin 10 mg today then she will resume her usual dosing schedule. She will have her INR checked at her PCPs office on Monday. If her INR is 2 or greater, she can stop her Lovenox. She will keep her followup with Dr. Myrtis Ser next month as scheduled.  2. Bradycardia:   She may be somewhat symptomatic from this. I will decrease her Lanoxin to 0.0625 mg daily.  3. Left Wrist Fracture:   As noted, surgery is being deferred. After October 24, she will be out of her 4 week post-cardioversion period. If surgery becomes urgent, she can safely come off of Coumadin after the date of 03/16/12 without Lovenox bridging.  Signed, Tereso Newcomer, PA-C  11:39 AM 03/09/2012

## 2012-03-09 NOTE — Patient Instructions (Addendum)
Your physician has recommended you make the following change in your medication: DECREASE DIGOXIN 0.0625 MG DAILY  KEEP FOLLOW UP APPOINTMENT 04/13/12 1:45 WITH DR. KATZ   WE WILL HAVE THE COUMADIN CLINIC CHECK YOUR INR TODAY  STAY ON LOVENOX UNTIL INR > 2  TODAY TAKE COUMADIN 10 MG THEN RESUME TO REGULAR DOSING SCHEDULE  HAVE PRIMARY CARE PHYSICIAN AT [facility] OFFICE CHECK INR ON 03/13/12 PER [facility]

## 2012-03-10 ENCOUNTER — Ambulatory Visit (INDEPENDENT_AMBULATORY_CARE_PROVIDER_SITE_OTHER): Payer: Medicare Other | Admitting: Family Medicine

## 2012-03-10 ENCOUNTER — Encounter: Payer: Self-pay | Admitting: Family Medicine

## 2012-03-10 VITALS — BP 122/62 | HR 75 | Temp 98.7°F | Wt 160.0 lb

## 2012-03-10 DIAGNOSIS — L0390 Cellulitis, unspecified: Secondary | ICD-10-CM

## 2012-03-10 NOTE — Progress Notes (Signed)
Chief Complaint  Patient presents with  . Follow-up    HPI: Tara Bradley if a patient of Dr. Lovell Sheehan, here for follow up of cellulitis on L leg. -continues to improve -denies fevers, chills, increased erythema or swelling -interval hx: fell again and seen in ED 10/15 with lft radial wrist fx, coumadin stopped and put on lovenox bridge in planning for possible ORIF. Seen by Dr. Alben Spittle in cards, 10/17 and given plans changed to trail of conservative tx with casting, coumadin restarted. Was supposed to recheck INR Monday and stop lovenox if therapeutic.  ROS: See pertinent positives and negatives per HPI.  Past Medical History  Diagnosis Date  . Personal history of malignant neoplasm of breast   . Personal history of other disorders of nervous system and sense organs   . Unspecified closed fracture of pelvis   . Closed fracture of unspecified part of femur   . Long term (current) use of anticoagulants   . Atrial fibrillation     Paroxysmal, infrequent, Coumadin  . Hyperlipidemia   . Osteoporosis   . Arthritis   . Hypertension   . Diabetes mellitus   . Diverticulosis   . Diverticulitis   . Syncope   . Chest pain     Nuclear, April, 2008, no ischemia,  . Ejection fraction     EF 60%, echo, February, 2008  //   EF 65-70%, echo, November, 2012  . Aortic valve sclerosis     Echo, 2008  . Warfarin anticoagulation   . Bradycardia     October, 2012    Family History  Problem Relation Age of Onset  . Heart failure Mother 63  . Heart attack Father 73  . Diabetes Father   . Colon cancer Neg Hx     History   Social History  . Marital Status: Married    Spouse Name: N/A    Number of Children: 2  . Years of Education: N/A   Occupational History  . Piano Teacher    Social History Main Topics  . Smoking status: Never Smoker   . Smokeless tobacco: Never Used  . Alcohol Use: No  . Drug Use: No  . Sexually Active: Not Currently   Other Topics Concern  . None   Social  History Narrative  . None    Current outpatient prescriptions:ALPRAZolam (XANAX) 0.5 MG tablet, Take 0.5 mg by mouth at bedtime as needed. For sleep, Disp: , Rfl: ;  Ascorbic Acid (VITAMIN C PO), Take 1 tablet by mouth daily., Disp: , Rfl: ;  B Complex-C (B-COMPLEX WITH VITAMIN C) tablet, Take 1 tablet by mouth daily., Disp: , Rfl: ;  Calcium Carbonate-Vitamin D (CALTRATE 600+D) 600-400 MG-UNIT per tablet, Take 1 tablet by mouth daily.  , Disp: , Rfl:  Cholecalciferol (VITAMIN D) 2000 UNITS tablet, Take 2,000 Units by mouth daily.  , Disp: , Rfl: ;  digoxin (LANOXIN) 0.125 MG tablet, Take 0.5 tablets (0.0625 mg total) by mouth daily., Disp: 90 tablet, Rfl: 3;  diltiazem (CARDIZEM CD) 240 MG 24 hr capsule, Take 240 mg by mouth daily., Disp: , Rfl: ;  enoxaparin (LOVENOX) 80 MG/0.8ML injection, Inject 0.7 mLs (70 mg total) into the skin every 12 (twelve) hours., Disp: 6 Syringe, Rfl: 0 glucose blood (TRUETEST TEST) test strip, 1 each by Other route 2 (two) times daily. Use as instructed , Disp: , Rfl: ;  HYDROcodone-acetaminophen (VICODIN) 5-500 MG per tablet, Take 1-2 tablets by mouth every 6 (six) hours as needed for  pain., Disp: 15 tablet, Rfl: 0;  losartan-hydrochlorothiazide (HYZAAR) 100-12.5 MG per tablet, Take 1 tablet by mouth daily., Disp: 90 tablet, Rfl: 3 Magnesium 250 MG TABS, Take 1 tablet by mouth daily.  , Disp: , Rfl: ;  metoprolol tartrate (LOPRESSOR) 25 MG tablet, Take 1 tablet (25 mg total) by mouth 2 (two) times daily., Disp: 180 tablet, Rfl: 3;  Multiple Vitamin (MULTIVITAMIN WITH MINERALS) TABS, Take 1 tablet by mouth daily., Disp: , Rfl: ;  pioglitazone (ACTOS) 45 MG tablet, Take 45 mg by mouth daily.  , Disp: , Rfl:  potassium chloride (KLOR-CON) 8 MEQ tablet, Take 8 mEq by mouth daily., Disp: , Rfl: ;  raloxifene (EVISTA) 60 MG tablet, Take 60 mg by mouth daily., Disp: , Rfl: ;  rosuvastatin (CRESTOR) 5 MG tablet, Take 5 mg by mouth 3 (three) times a week. Mon, Weds & Fri, Disp: , Rfl:  ;  Saxagliptin-Metformin 2.09-998 MG TB24, Take 1 tablet by mouth daily., Disp: , Rfl:  warfarin (COUMADIN) 5 MG tablet, Take 7.5-10 mg by mouth daily. Takes 10 mg on Mon, Weds, & Fri and 7.5 mg all other days of the week, Disp: , Rfl:   EXAM:  Filed Vitals:   03/10/12 1511  BP: 122/62  Pulse: 75  Temp: 98.7 F (37.1 C)    There is no height on file to calculate BMI.  GENERAL: vitals reviewed and listed above, alert, oriented, appears well hydrated and in no acute distress  HEENT: atraumatic, conjunttiva clear, no obvious abnormalities on inspection of external nose and ears  NECK: no obvious masses on inspection  LUNGS: clear to auscultation bilaterally, no wheezes, rales or rhonchi, good air movement  CV: HRRR  SKIN: cellulitic appearance of L leg resolved, finding now c/w resolving ecchymosis  MS:  -cast L arm, swelling of 1st-4th digits - sensation intact, normal temperature and good cap refill  PSYCH: pleasant and cooperative, no obvious depression or anxiety  ASSESSMENT AND PLAN:  Discussed the following assessment and plan:  1. Cellulitis   -resolved, all skin findings now c/w contusion - improving -hand swelling with ecchymosis of skin, normal sensation, cap refill mildly sluggish compared to R - discussed with [facility] and recs for her to come over to their clinic after visit here. -advised to follow up with coumadin clinic and PCP as scheduled  -Patient advised to return or notify a doctor immediately if symptoms worsen or persist or new concerns arise.  There are no Patient Instructions on file for this visit.   Kriste Basque R.

## 2012-03-13 ENCOUNTER — Ambulatory Visit (INDEPENDENT_AMBULATORY_CARE_PROVIDER_SITE_OTHER): Payer: Medicare Other | Admitting: Family

## 2012-03-13 DIAGNOSIS — I4891 Unspecified atrial fibrillation: Secondary | ICD-10-CM

## 2012-03-13 LAB — POCT INR: INR: 1.7

## 2012-03-13 NOTE — Patient Instructions (Addendum)
Today, take 2 1/2 tablets (Monday. Tomorrow take 2 tablets.  Then Continue 10 mg Monday, Wednesday, Friday. Then 7.5 mg every other day. Check in  10 days.     Latest dosing instructions   Total Tara Bradley Tue Wed Thu Fri Sat   60 7.5 mg 10 mg 7.5 mg 10 mg 7.5 mg 10 mg 7.5 mg    (5 mg1.5) (5 mg2) (5 mg1.5) (5 mg2) (5 mg1.5) (5 mg2) (5 mg1.5)

## 2012-03-23 ENCOUNTER — Ambulatory Visit (INDEPENDENT_AMBULATORY_CARE_PROVIDER_SITE_OTHER): Payer: Medicare Other | Admitting: Family

## 2012-03-23 DIAGNOSIS — I4891 Unspecified atrial fibrillation: Secondary | ICD-10-CM

## 2012-03-23 LAB — POCT INR: INR: 2

## 2012-03-23 NOTE — Patient Instructions (Addendum)
Continue 10 mg Monday, Wednesday, Friday. Then 7.5 mg every other day. Recheck 3 weeks    Latest dosing instructions   Total Sun Mon Tue Wed Thu Fri Sat   60 7.5 mg 10 mg 7.5 mg 10 mg 7.5 mg 10 mg 7.5 mg    (5 mg1.5) (5 mg2) (5 mg1.5) (5 mg2) (5 mg1.5) (5 mg2) (5 mg1.5)

## 2012-03-27 ENCOUNTER — Ambulatory Visit (INDEPENDENT_AMBULATORY_CARE_PROVIDER_SITE_OTHER): Payer: Medicare Other | Admitting: Internal Medicine

## 2012-03-27 ENCOUNTER — Encounter: Payer: Self-pay | Admitting: Internal Medicine

## 2012-03-27 VITALS — BP 118/72 | HR 68 | Temp 98.2°F | Resp 16 | Ht 65.0 in | Wt 160.0 lb

## 2012-03-27 DIAGNOSIS — F419 Anxiety disorder, unspecified: Secondary | ICD-10-CM

## 2012-03-27 DIAGNOSIS — I10 Essential (primary) hypertension: Secondary | ICD-10-CM

## 2012-03-27 DIAGNOSIS — M810 Age-related osteoporosis without current pathological fracture: Secondary | ICD-10-CM

## 2012-03-27 DIAGNOSIS — S52309A Unspecified fracture of shaft of unspecified radius, initial encounter for closed fracture: Secondary | ICD-10-CM

## 2012-03-27 DIAGNOSIS — E119 Type 2 diabetes mellitus without complications: Secondary | ICD-10-CM

## 2012-03-27 DIAGNOSIS — S52342A Displaced spiral fracture of shaft of radius, left arm, initial encounter for closed fracture: Secondary | ICD-10-CM

## 2012-03-27 DIAGNOSIS — F411 Generalized anxiety disorder: Secondary | ICD-10-CM

## 2012-03-27 DIAGNOSIS — Z23 Encounter for immunization: Secondary | ICD-10-CM

## 2012-03-27 MED ORDER — ALPRAZOLAM 0.5 MG PO TABS: 0.5000 mg | ORAL_TABLET | Freq: Every evening | ORAL | Status: DC | PRN

## 2012-03-27 NOTE — Progress Notes (Signed)
Subjective:    Patient ID: Tara Bradley, female    DOB: 1923/05/02, 78 y.o.   MRN: [id number]  HPI  Recent cardioversion is back in sinus rhythm at a bradycardic rate.  The patient had a fall that was not due to syncope fell and fractured her left radius and ulnar with a spiral fracture.  She is now under the care of orthopedist but she is unable to provide home care secondary to her fracture and splint she needs to have her TED hose in place she has other home activities and ADLs that she may need a cystoscopy with and we are getting home health care consult to evaluate for nurse's aide  Review of Systems  Constitutional: Negative for activity change, appetite change and fatigue.  HENT: Negative for ear pain, congestion, neck pain, postnasal drip and sinus pressure.   Eyes: Negative for redness and visual disturbance.  Respiratory: Negative for cough, shortness of breath and wheezing.   Gastrointestinal: Negative for abdominal pain and abdominal distention.  Genitourinary: Negative for dysuria, frequency and menstrual problem.  Musculoskeletal: Positive for myalgias and joint swelling.  Skin: Negative for rash and wound.  Neurological: Positive for weakness. Negative for dizziness and headaches.  Hematological: Negative for adenopathy. Does not bruise/bleed easily.  Psychiatric/Behavioral: Negative for sleep disturbance and decreased concentration.       Past Medical History  Diagnosis Date  . Personal history of malignant neoplasm of breast   . Personal history of other disorders of nervous system and sense organs   . Unspecified closed fracture of pelvis   . Closed fracture of unspecified part of femur   . Long term (current) use of anticoagulants   . Atrial fibrillation     Paroxysmal, infrequent, Coumadin  . Hyperlipidemia   . Osteoporosis   . Arthritis   . Hypertension   . Diabetes mellitus   . Diverticulosis   . Diverticulitis   . Syncope   . Chest pain    Nuclear, April, 2008, no ischemia,  . Ejection fraction     EF 60%, echo, February, 2008  //   EF 65-70%, echo, November, 2012  . Aortic valve sclerosis     Echo, 2008  . Warfarin anticoagulation   . Bradycardia     October, 2012    History   Social History  . Marital Status: Married    Spouse Name: N/A    Number of Children: 2  . Years of Education: N/A   Occupational History  . Piano Teacher    Social History Main Topics  . Smoking status: Never Smoker   . Smokeless tobacco: Never Used  . Alcohol Use: No  . Drug Use: No  . Sexually Active: Not Currently   Other Topics Concern  . Not on file   Social History Narrative  . No narrative on file    Past Surgical History  Procedure Date  . Abdominal hysterectomy 1995  . Shoulder surgery     left  . Mastectomy 1995    left  . Femur surgery   . Wrist surgery      x 2     Family History  Problem Relation Age of Onset  . Heart failure Mother 73  . Heart attack Father 50  . Diabetes Father   . Colon cancer Neg Hx     Allergies  Allergen Reactions  . Codeine Other (See Comments)    headache  . Codeine Sulfate     REACTION: unspecified  Current Outpatient Prescriptions on File Prior to Visit  Medication Sig Dispense Refill  . ALPRAZolam (XANAX) 0.5 MG tablet Take 0.5 mg by mouth at bedtime as needed. For sleep      . Ascorbic Acid (VITAMIN C PO) Take 1 tablet by mouth daily.      . B Complex-C (B-COMPLEX WITH VITAMIN C) tablet Take 1 tablet by mouth daily.      . Calcium Carbonate-Vitamin D (CALTRATE 600+D) 600-400 MG-UNIT per tablet Take 1 tablet by mouth daily.        . Cholecalciferol (VITAMIN D) 2000 UNITS tablet Take 2,000 Units by mouth daily.        . digoxin (LANOXIN) 0.125 MG tablet Take 0.5 tablets (0.0625 mg total) by mouth daily.  90 tablet  3  . diltiazem (CARDIZEM CD) 240 MG 24 hr capsule Take 240 mg by mouth daily.      [name] glucose blood (TRUETEST TEST) test strip 1 each by Other route 2  (two) times daily. Use as instructed       . losartan-hydrochlorothiazide (HYZAAR) 100-12.5 MG per tablet Take 1 tablet by mouth daily.  90 tablet  3  . Magnesium 250 MG TABS Take 1 tablet by mouth daily.        . metoprolol tartrate (LOPRESSOR) 25 MG tablet Take 1 tablet (25 mg total) by mouth 2 (two) times daily.  180 tablet  3  . Multiple Vitamin (MULTIVITAMIN WITH MINERALS) TABS Take 1 tablet by mouth daily.      . pioglitazone (ACTOS) 45 MG tablet Take 45 mg by mouth daily.        . potassium chloride (KLOR-CON) 8 MEQ tablet Take 8 mEq by mouth daily.      . raloxifene (EVISTA) 60 MG tablet Take 60 mg by mouth daily.      . rosuvastatin (CRESTOR) 5 MG tablet Take 5 mg by mouth 3 (three) times a week. Mon, Weds & Fri      . Saxagliptin-Metformin 2.09-998 MG TB24 Take 1 tablet by mouth daily.      [name] warfarin (COUMADIN) 5 MG tablet Take 7.5-10 mg by mouth daily. Takes 10 mg on Mon, Weds, & Fri and 7.5 mg all other days of the week        BP 118/72  Pulse 68  Temp 98.2 F (36.8 C)  Resp 16  Ht 5\' 5"  (1.651 m)  Wt 160 lb (72.576 kg)  BMI 26.63 kg/m2    Objective:   Physical Exam  Constitutional: She appears well-developed and well-nourished. No distress.  HENT:  Head: Normocephalic and atraumatic.  Right Ear: External ear normal.  Left Ear: External ear normal.  Nose: Nose normal.  Mouth/Throat: Oropharynx is clear and moist.  Eyes: Conjunctivae normal and EOM are normal. Pupils are equal, round, and reactive to light.  Neck: Normal range of motion. Neck supple. No JVD present. No tracheal deviation present. No thyromegaly present.  Cardiovascular: Normal rate, regular rhythm and intact distal pulses.   Murmur heard. Pulmonary/Chest: Effort normal and breath sounds normal. She has no wheezes. She exhibits no tenderness.  Abdominal: Soft. Bowel sounds are normal.  Musculoskeletal: She exhibits edema and tenderness.  Lymphadenopathy:    She has no cervical adenopathy.    Neurological: No cranial nerve deficit. Coordination abnormal.  Skin: She is not diaphoretic.  Psychiatric: She has a normal mood and affect. Her behavior is normal.          Assessment & Plan:  Refer  to [facility] Fracture of arm  Orthopedic surgery was cancelled Needs to wear stocking for edema and bruising of right leg AF is resolved Blood pressure stable Digoxin was decreased Consider change to coreg?

## 2012-03-27 NOTE — Patient Instructions (Signed)
The patient is instructed to continue all medications as prescribed. Schedule followup with check out clerk upon leaving the clinic  

## 2012-03-29 ENCOUNTER — Telehealth: Payer: Self-pay | Admitting: Family Medicine

## 2012-03-29 NOTE — Telephone Encounter (Signed)
Tara Bradley, please call Clydie Braun at [facility] to clarify Home Health referral. She has questions. Thanks!

## 2012-03-29 NOTE — Telephone Encounter (Signed)
Talked with karen---asking questions--only send out na if doing pt--will call after accessing pt

## 2012-04-03 ENCOUNTER — Telehealth (INDEPENDENT_AMBULATORY_CARE_PROVIDER_SITE_OTHER): Payer: Self-pay | Admitting: General Surgery

## 2012-04-03 NOTE — Telephone Encounter (Signed)
Faxed Rx for post mastectomy bras to [facility].

## 2012-04-03 NOTE — Telephone Encounter (Signed)
Pt called to request a prescription, and for it to be FAXed to [facility], for replacing her Lt breast prosthesis (Dx: breast cancer 174.9)  FAX to [phone] when available.

## 2012-04-04 ENCOUNTER — Other Ambulatory Visit: Payer: Self-pay | Admitting: *Deleted

## 2012-04-04 MED ORDER — POTASSIUM CHLORIDE ER 8 MEQ PO TBCR: 8.0000 meq | EXTENDED_RELEASE_TABLET | Freq: Every day | ORAL | Status: DC

## 2012-04-12 ENCOUNTER — Encounter: Payer: Self-pay | Admitting: Cardiology

## 2012-04-13 ENCOUNTER — Encounter: Payer: Self-pay | Admitting: Cardiology

## 2012-04-13 ENCOUNTER — Ambulatory Visit (INDEPENDENT_AMBULATORY_CARE_PROVIDER_SITE_OTHER): Payer: Medicare Other | Admitting: Cardiology

## 2012-04-13 ENCOUNTER — Ambulatory Visit (INDEPENDENT_AMBULATORY_CARE_PROVIDER_SITE_OTHER): Payer: Medicare Other | Admitting: *Deleted

## 2012-04-13 VITALS — BP 132/60 | HR 68 | Ht 65.0 in | Wt 166.0 lb

## 2012-04-13 DIAGNOSIS — I4891 Unspecified atrial fibrillation: Secondary | ICD-10-CM

## 2012-04-13 DIAGNOSIS — I358 Other nonrheumatic aortic valve disorders: Secondary | ICD-10-CM

## 2012-04-13 DIAGNOSIS — Z7901 Long term (current) use of anticoagulants: Secondary | ICD-10-CM

## 2012-04-13 DIAGNOSIS — I359 Nonrheumatic aortic valve disorder, unspecified: Secondary | ICD-10-CM

## 2012-04-13 LAB — POCT INR: INR: 2.7

## 2012-04-13 NOTE — Assessment & Plan Note (Signed)
Patient talk with me about continuing Coumadin versus other medications. I'm comfortable with her on Coumadin. This is her preference. Coumadin will be continued.

## 2012-04-13 NOTE — Assessment & Plan Note (Signed)
The patient was cardioverted in the emergency room in September, 2013. She's not had any recurrence since then. She continues on Coumadin. No change in therapy.

## 2012-04-13 NOTE — Patient Instructions (Addendum)
Your physician wants you to follow-up in:  6 months. You will receive a reminder letter in the mail two months in advance. If you don't receive a letter, please call our office to schedule the follow-up appointment.   

## 2012-04-13 NOTE — Progress Notes (Signed)
Patient ID: Tara Bradley, female   DOB: [birth year], 78 y.o.   MRN: [id number]   HPI  The patient is seen today to followup atrial fibrillation. She is actually doing well. She had fallen without syncope and fractured her left arm. Around that time she had atrial fibrillation and decision was made to proceed with cardioversion in the emergency room. This was done. She is held sinus rhythm since then. She's actually doing well. She's not having any chest pain. There is no shortness of breath.  Allergies  Allergen Reactions  . Codeine Other (See Comments)    headache  . Codeine Sulfate     REACTION: unspecified    Current Outpatient Prescriptions  Medication Sig Dispense Refill  . ALPRAZolam (XANAX) 0.5 MG tablet Take 1 tablet (0.5 mg total) by mouth at bedtime as needed. For sleep  30 tablet  3  . Ascorbic Acid (VITAMIN C PO) Take 1 tablet by mouth daily.      . B Complex-C (B-COMPLEX WITH VITAMIN C) tablet Take 1 tablet by mouth daily.      . Calcium Carbonate-Vitamin D (CALTRATE 600+D) 600-400 MG-UNIT per tablet Take 1 tablet by mouth daily.        . Cholecalciferol (VITAMIN D) 2000 UNITS tablet Take 2,000 Units by mouth daily.        . digoxin (LANOXIN) 0.125 MG tablet Take 0.5 tablets (0.0625 mg total) by mouth daily.  90 tablet  3  . diltiazem (CARDIZEM CD) 240 MG 24 hr capsule Take 240 mg by mouth daily.      [name] glucose blood (TRUETEST TEST) test strip 1 each by Other route 2 (two) times daily. Use as instructed       . losartan-hydrochlorothiazide (HYZAAR) 100-12.5 MG per tablet Take 1 tablet by mouth daily.  90 tablet  3  . Magnesium 250 MG TABS Take 1 tablet by mouth daily.        . metoprolol tartrate (LOPRESSOR) 25 MG tablet Take 1 tablet (25 mg total) by mouth 2 (two) times daily.  180 tablet  3  . Multiple Vitamin (MULTIVITAMIN WITH MINERALS) TABS Take 1 tablet by mouth daily.      . pioglitazone (ACTOS) 45 MG tablet Take 45 mg by mouth daily.        . potassium chloride  (KLOR-CON) 8 MEQ tablet Take 1 tablet (8 mEq total) by mouth daily.  90 tablet  3  . raloxifene (EVISTA) 60 MG tablet Take 60 mg by mouth daily.      . rosuvastatin (CRESTOR) 5 MG tablet Take 5 mg by mouth 3 (three) times a week. Mon, Weds & Fri      . Saxagliptin-Metformin 2.09-998 MG TB24 Take 1 tablet by mouth daily.      [name] warfarin (COUMADIN) 5 MG tablet Take 7.5-10 mg by mouth daily. Takes 10 mg on Mon, Weds, & Fri and 7.5 mg all other days of the week        History   Social History  . Marital Status: Married    Spouse Name: N/A    Number of Children: 2  . Years of Education: N/A   Occupational History  . Piano Teacher    Social History Main Topics  . Smoking status: Never Smoker   . Smokeless tobacco: Never Used  . Alcohol Use: No  . Drug Use: No  . Sexually Active: Not Currently   Other Topics Concern  . Not on file   Social History  Narrative  . No narrative on file    Family History  Problem Relation Age of Onset  . Heart failure Mother 51  . Heart attack Father 35  . Diabetes Father   . Colon cancer Neg Hx     Past Medical History  Diagnosis Date  . Personal history of malignant neoplasm of breast   . Personal history of other disorders of nervous system and sense organs   . Unspecified closed fracture of pelvis   . Closed fracture of unspecified part of femur   . Long term (current) use of anticoagulants   . Atrial fibrillation     Paroxysmal, infrequent, Coumadin  . Hyperlipidemia   . Osteoporosis   . Arthritis   . Hypertension   . Diabetes mellitus   . Diverticulosis   . Diverticulitis   . Syncope   . Chest pain     Nuclear, April, 2008, no ischemia,  . Ejection fraction     EF 60%, echo, February, 2008  //   EF 65-70%, echo, November, 2012  . Aortic valve sclerosis     Echo, 2008  . Warfarin anticoagulation   . Bradycardia     October, 2012    Past Surgical History  Procedure Date  . Abdominal hysterectomy 1995  . Shoulder surgery       left  . Mastectomy 1995    left  . Femur surgery   . Wrist surgery      x 2     Patient Active Problem List  Diagnosis  . DIABETES MELLITUS, TYPE II  . HYPERLIPIDEMIA, WITH HIGH HDL  . HYPERTENSION  . Atrial fibrillation  . URI  . OSTEOARTHRITIS  . OSTEOPOROSIS  . ADENOCARCINOMA, BREAST, HX OF  . SYNCOPE, HX OF  . MASTECTOMY, LEFT, HX OF  . ANXIETY STATE, UNSPECIFIED  . SCOLIOSIS, LUMBAR SPINE  . Diverticulosis of colon  . Warfarin anticoagulation  . Aortic valve sclerosis  . Syncope  . Bradycardia  . History of breast cancer in female  . Ejection fraction    ROS   Patient denies fever, chills, headache, sweats, rash, change in vision, change in hearing, chest pain, cough, nausea vomiting, urinary symptoms. All other systems are reviewed and are negative.  PHYSICAL EXAM   Patient is oriented to person time and place. Affect is normal. There is no jugulovenous distention. Lungs are clear. Respiratory effort is nonlabored.  Cardiac exam reveals S1 and S2. The rhythm is regular. Abdomen is soft. There is a soft systolic murmur. There is no edema. She has a cast on her left arm.  Filed Vitals:   04/13/12 1401  BP: 132/60  Pulse: 68  Height: 5\' 5"  (1.651 m)  Weight: 166 lb (75.297 kg)     ASSESSMENT & PLAN

## 2012-04-13 NOTE — Assessment & Plan Note (Signed)
She has aortic valve sclerosis. No stenosis.

## 2012-04-14 ENCOUNTER — Encounter: Payer: Medicare Other | Admitting: Family

## 2012-05-11 ENCOUNTER — Ambulatory Visit (INDEPENDENT_AMBULATORY_CARE_PROVIDER_SITE_OTHER): Payer: Medicare Other | Admitting: Pharmacist

## 2012-05-11 DIAGNOSIS — I4891 Unspecified atrial fibrillation: Secondary | ICD-10-CM

## 2012-05-11 LAB — POCT INR: INR: 2.2

## 2012-06-08 ENCOUNTER — Ambulatory Visit (INDEPENDENT_AMBULATORY_CARE_PROVIDER_SITE_OTHER): Payer: Medicare Other | Admitting: *Deleted

## 2012-06-08 DIAGNOSIS — I4891 Unspecified atrial fibrillation: Secondary | ICD-10-CM

## 2012-06-08 LAB — POCT INR: INR: 1.2

## 2012-06-20 ENCOUNTER — Ambulatory Visit (INDEPENDENT_AMBULATORY_CARE_PROVIDER_SITE_OTHER): Payer: Medicare Other | Admitting: *Deleted

## 2012-06-20 DIAGNOSIS — I4891 Unspecified atrial fibrillation: Secondary | ICD-10-CM

## 2012-06-20 LAB — POCT INR: INR: 2.1

## 2012-07-11 ENCOUNTER — Ambulatory Visit (INDEPENDENT_AMBULATORY_CARE_PROVIDER_SITE_OTHER): Payer: Medicare Other | Admitting: *Deleted

## 2012-07-11 DIAGNOSIS — I4891 Unspecified atrial fibrillation: Secondary | ICD-10-CM

## 2012-07-11 LAB — POCT INR: INR: 2.9

## 2012-07-26 ENCOUNTER — Ambulatory Visit (INDEPENDENT_AMBULATORY_CARE_PROVIDER_SITE_OTHER): Payer: Medicare Other | Admitting: Internal Medicine

## 2012-07-26 ENCOUNTER — Encounter: Payer: Self-pay | Admitting: Internal Medicine

## 2012-07-26 ENCOUNTER — Ambulatory Visit: Payer: Medicare Other | Admitting: Internal Medicine

## 2012-07-26 VITALS — BP 140/84 | HR 84 | Temp 98.2°F | Resp 16 | Ht 65.0 in | Wt 172.0 lb

## 2012-07-26 DIAGNOSIS — G4700 Insomnia, unspecified: Secondary | ICD-10-CM

## 2012-07-26 DIAGNOSIS — E119 Type 2 diabetes mellitus without complications: Secondary | ICD-10-CM

## 2012-07-26 DIAGNOSIS — E785 Hyperlipidemia, unspecified: Secondary | ICD-10-CM

## 2012-07-26 DIAGNOSIS — I10 Essential (primary) hypertension: Secondary | ICD-10-CM

## 2012-07-26 DIAGNOSIS — F411 Generalized anxiety disorder: Secondary | ICD-10-CM

## 2012-07-26 MED ORDER — ZALEPLON 5 MG PO CAPS: 5.0000 mg | ORAL_CAPSULE | Freq: Every day | ORAL | Status: DC

## 2012-07-26 NOTE — Patient Instructions (Signed)
The patient is instructed to continue all medications as prescribed. Schedule followup with check out clerk upon leaving the clinic  

## 2012-07-26 NOTE — Assessment & Plan Note (Signed)
Husband with progressive dementia and stress

## 2012-07-26 NOTE — Progress Notes (Signed)
Subjective:    Patient ID: Tara Bradley, female    DOB: 06/28/1922, 79 y.o.   MRN: [id number]  HPI  Increased stress with husband with OBS/ dementia Increased anxiety Increased insomnia Review of Systems  Constitutional: Negative for activity change, appetite change and fatigue.  HENT: Negative for ear pain, congestion, neck pain, postnasal drip and sinus pressure.   Eyes: Negative for redness and visual disturbance.  Respiratory: Negative for cough, shortness of breath and wheezing.   Gastrointestinal: Negative for abdominal pain and abdominal distention.  Genitourinary: Negative for dysuria, frequency and menstrual problem.  Musculoskeletal: Negative for myalgias, joint swelling and arthralgias.  Skin: Negative for rash and wound.  Neurological: Negative for dizziness, weakness and headaches.  Hematological: Negative for adenopathy. Does not bruise/bleed easily.  Psychiatric/Behavioral: Negative for sleep disturbance and decreased concentration.   Past Medical History  Diagnosis Date  . Personal history of malignant neoplasm of breast   . Personal history of other disorders of nervous system and sense organs   . Unspecified closed fracture of pelvis   . Closed fracture of unspecified part of femur   . Long term (current) use of anticoagulants   . Atrial fibrillation     Paroxysmal, infrequent, Coumadin  . Hyperlipidemia   . Osteoporosis   . Arthritis   . Hypertension   . Diabetes mellitus   . Diverticulosis   . Diverticulitis   . Syncope   . Chest pain     Nuclear, April, 2008, no ischemia,  . Ejection fraction     EF 60%, echo, February, 2008  //   EF 65-70%, echo, November, 2012  . Aortic valve sclerosis     Echo, 2008  . Warfarin anticoagulation   . Bradycardia     October, 2012    History   Social History  . Marital Status: Married    Spouse Name: N/A    Number of Children: 2  . Years of Education: N/A   Occupational History  . Piano Teacher     Social History Main Topics  . Smoking status: Never Smoker   . Smokeless tobacco: Never Used  . Alcohol Use: No  . Drug Use: No  . Sexually Active: Not Currently   Other Topics Concern  . Not on file   Social History Narrative  . No narrative on file    Past Surgical History  Procedure Laterality Date  . Abdominal hysterectomy  1995  . Shoulder surgery      left  . Mastectomy  1995    left  . Femur surgery    . Wrist surgery       x 2     Family History  Problem Relation Age of Onset  . Heart failure Mother 31  . Heart attack Father 26  . Diabetes Father   . Colon cancer Neg Hx     Allergies  Allergen Reactions  . Codeine Other (See Comments)    headache  . Codeine Sulfate     REACTION: unspecified    Current Outpatient Prescriptions on File Prior to Visit  Medication Sig Dispense Refill  . ALPRAZolam (XANAX) 0.5 MG tablet Take 1 tablet (0.5 mg total) by mouth at bedtime as needed. For sleep  30 tablet  3  . Ascorbic Acid (VITAMIN C PO) Take 1 tablet by mouth daily.      . B Complex-C (B-COMPLEX WITH VITAMIN C) tablet Take 1 tablet by mouth daily.      . Calcium  Carbonate-Vitamin D (CALTRATE 600+D) 600-400 MG-UNIT per tablet Take 1 tablet by mouth daily.        . Cholecalciferol (VITAMIN D) 2000 UNITS tablet Take 2,000 Units by mouth daily.        . digoxin (LANOXIN) 0.125 MG tablet Take 0.5 tablets (0.0625 mg total) by mouth daily.  90 tablet  3  . diltiazem (CARDIZEM CD) 240 MG 24 hr capsule Take 240 mg by mouth daily.      [name] glucose blood (TRUETEST TEST) test strip 1 each by Other route 2 (two) times daily. Use as instructed       . losartan-hydrochlorothiazide (HYZAAR) 100-12.5 MG per tablet Take 1 tablet by mouth daily.  90 tablet  3  . Magnesium 250 MG TABS Take 1 tablet by mouth daily.        . metoprolol tartrate (LOPRESSOR) 25 MG tablet Take 1 tablet (25 mg total) by mouth 2 (two) times daily.  180 tablet  3  . Multiple Vitamin (MULTIVITAMIN WITH  MINERALS) TABS Take 1 tablet by mouth daily.      . pioglitazone (ACTOS) 45 MG tablet Take 45 mg by mouth daily.        . potassium chloride (KLOR-CON) 8 MEQ tablet Take 1 tablet (8 mEq total) by mouth daily.  90 tablet  3  . raloxifene (EVISTA) 60 MG tablet Take 60 mg by mouth daily.      . rosuvastatin (CRESTOR) 5 MG tablet Take 5 mg by mouth 3 (three) times a week. Mon, Weds & Fri      . Saxagliptin-Metformin 2.09-998 MG TB24 Take 1 tablet by mouth daily.      [name] warfarin (COUMADIN) 5 MG tablet Take 7.5-10 mg by mouth daily. Takes 10 mg on Mon, Weds, & Fri and 7.5 mg all other days of the week       No current facility-administered medications on file prior to visit.    BP 140/84  Pulse 84  Temp(Src) 98.2 F (36.8 C)  Resp 16  Ht 5\' 5"  (1.651 m)  Wt 172 lb (78.019 kg)  BMI 28.62 kg/m2        Objective:   Physical Exam  Constitutional: She is oriented to person, place, and time. She appears well-developed and well-nourished. No distress.  HENT:  Head: Normocephalic and atraumatic.  Right Ear: External ear normal.  Left Ear: External ear normal.  Nose: Nose normal.  Mouth/Throat: Oropharynx is clear and moist.  Eyes: Conjunctivae and EOM are normal. Pupils are equal, round, and reactive to light.  Neck: Normal range of motion. Neck supple. No JVD present. No tracheal deviation present. No thyromegaly present.  Cardiovascular: Normal rate, regular rhythm and intact distal pulses.   Murmur heard. Pulmonary/Chest: Effort normal and breath sounds normal. She has no wheezes. She exhibits no tenderness.  Abdominal: Soft. Bowel sounds are normal.  Musculoskeletal: Normal range of motion. She exhibits edema and tenderness.  Lymphadenopathy:    She has no cervical adenopathy.  Neurological: She is alert and oriented to person, place, and time. She has normal reflexes. No cranial nerve deficit.  Skin: Skin is warm and dry. She is not diaphoretic.  Psychiatric: She has a normal mood  and affect. Her behavior is normal.          Assessment & Plan:  Add sonata for sleep Long discussion about husband prognosis with dementia and its effect on her Monitoring Stable HTN Stable DM followed by endocrine

## 2012-08-02 ENCOUNTER — Encounter (INDEPENDENT_AMBULATORY_CARE_PROVIDER_SITE_OTHER): Payer: Self-pay | Admitting: General Surgery

## 2012-08-03 ENCOUNTER — Telehealth: Payer: Self-pay | Admitting: Cardiology

## 2012-08-03 NOTE — Telephone Encounter (Signed)
Spoke with pt, when she saw dr Lovell Sheehan earlier this month he said her pulse was irregular and then yesterday when she saw dr altheimer, he said the same thing. This is the first episode she has had since her cardioversion in oct. She states she was able to tell when she was out of rhythm in the past because she would feel faint, now she is unaware of the pulse being irregular. She does not feel her heart race but her monitor shows her heart rate is from 85-150. Her bp yesterday was 140/90. She denies SOB or chest discomfort, just feels more tired than usual. Reassurance given to pt regarding the risk of stroke being lower because she is on coumadin. She has an appt next week with the PA but wanted to know about changing her meds prior to that appt. Pt given the okay to increase metoprolol to 50 mg bid. She had taken this dose in the past and it and the digoxin were decreased. She will keep her digoxin dose the same. Pt agreed with this plan and will call prior to appt with problems.

## 2012-08-03 NOTE — Telephone Encounter (Signed)
New Problem:    Patient called in because her heart rate is irregular, per Dr. Altheimer's Office, and would like to know how to proceed.  Please call back.

## 2012-08-07 ENCOUNTER — Other Ambulatory Visit: Payer: Self-pay

## 2012-08-07 DIAGNOSIS — Z9012 Acquired absence of left breast and nipple: Secondary | ICD-10-CM

## 2012-08-07 DIAGNOSIS — Z1231 Encounter for screening mammogram for malignant neoplasm of breast: Secondary | ICD-10-CM

## 2012-08-08 ENCOUNTER — Ambulatory Visit (INDEPENDENT_AMBULATORY_CARE_PROVIDER_SITE_OTHER): Payer: Medicare Other | Admitting: *Deleted

## 2012-08-08 ENCOUNTER — Encounter: Payer: Medicare Other | Admitting: Family

## 2012-08-08 ENCOUNTER — Ambulatory Visit (INDEPENDENT_AMBULATORY_CARE_PROVIDER_SITE_OTHER)
Admission: RE | Admit: 2012-08-08 | Discharge: 2012-08-08 | Disposition: A | Discharge status: Home / Self Care | Payer: Medicare Other | Source: Ambulatory Visit | Attending: Physician Assistant | Admitting: Physician Assistant

## 2012-08-08 ENCOUNTER — Ambulatory Visit (INDEPENDENT_AMBULATORY_CARE_PROVIDER_SITE_OTHER): Payer: Medicare Other | Admitting: Physician Assistant

## 2012-08-08 ENCOUNTER — Encounter: Payer: Self-pay | Admitting: Physician Assistant

## 2012-08-08 VITALS — BP 118/73 | HR 140 | Ht 65.0 in | Wt 173.1 lb

## 2012-08-08 DIAGNOSIS — S0083XA Contusion of other part of head, initial encounter: Secondary | ICD-10-CM

## 2012-08-08 DIAGNOSIS — W19XXXA Unspecified fall, initial encounter: Secondary | ICD-10-CM

## 2012-08-08 DIAGNOSIS — S0003XA Contusion of scalp, initial encounter: Secondary | ICD-10-CM

## 2012-08-08 DIAGNOSIS — I10 Essential (primary) hypertension: Secondary | ICD-10-CM

## 2012-08-08 DIAGNOSIS — I4891 Unspecified atrial fibrillation: Secondary | ICD-10-CM

## 2012-08-08 LAB — POCT INR: INR: 3.9

## 2012-08-08 NOTE — H&P (Signed)
History and Physical  Date:  08/08/2012   ID:  Tara Bradley, DOB 11-19-1922, MRN [id number]  PCP:  Tara Mew, MD  Primary Cardiologist:  Dr. Zackery Bradley     History of Present Illness: Tara Bradley is a 79 y.o. female who returns for follow up on AFib.  She has a hx of parox AFib, HL, HTN, DM2.  Echo 03/2011:  EF 65-70%.  She was evaluated in the ED 02/16/12 with AFib with RVR.  She underwent DCCV and discharged to home.  Last seen by Dr. Zackery Bradley 03/2012.  She was in NSR at that time and was to f/u in 6 mos.  Patient was told she had an irregular rhythm about 2 weeks ago by her PCP.  She denies palpitations, dyspnea, chest pain, orthopnea, PND, edema, dizziness, syncope.  She fell this past weekend.  She has a large ecchymosis about her right eye.  Also has a right wrist fracture.  She has seen Dr. Thomasena Bradley and has a cast on now.   Labs (10/11): TSH 4.38  Venous Dopplers (9/13): No left leg DVT  Labs (10/13): K 3.8, creatinine 1.10, Hgb 11.9  Wt Readings from Last 3 Encounters:  08/08/12 173 lb 1.9 oz (78.527 kg)  07/26/12 172 lb (78.019 kg)  04/13/12 166 lb (75.297 kg)     Past Medical History  Diagnosis Date  . Personal history of malignant neoplasm of breast   . Personal history of other disorders of nervous system and sense organs   . Unspecified closed fracture of pelvis   . Closed fracture of unspecified part of femur   . Long term (current) use of anticoagulants   . Atrial fibrillation     Paroxysmal, infrequent, Coumadin  . Hyperlipidemia   . Osteoporosis   . Arthritis   . Hypertension   . Diabetes mellitus   . Diverticulosis   . Diverticulitis   . Syncope   . Chest pain     Nuclear, April, 2008, no ischemia,  . Ejection fraction     EF 60%, echo, February, 2008  //   EF 65-70%, echo, November, 2012  . Aortic valve sclerosis     Echo, 2008  . Warfarin anticoagulation   . Bradycardia     October, 2012    Current Outpatient Prescriptions   Medication Sig Dispense Refill  . ALPRAZolam (XANAX) 0.5 MG tablet Take 1 tablet (0.5 mg total) by mouth at bedtime as needed. For sleep  30 tablet  3  . Ascorbic Acid (VITAMIN C PO) Take 1 tablet by mouth daily.      . B Complex-C (B-COMPLEX WITH VITAMIN C) tablet Take 1 tablet by mouth daily.      . Calcium Carbonate-Vitamin D (CALTRATE 600+D) 600-400 MG-UNIT per tablet Take 1 tablet by mouth daily.        . Cholecalciferol (VITAMIN D) 2000 UNITS tablet Take 2,000 Units by mouth daily.        . digoxin (LANOXIN) 0.125 MG tablet Take 0.5 tablets (0.0625 mg total) by mouth daily.  90 tablet  3  . diltiazem (CARDIZEM CD) 240 MG 24 hr capsule Take 240 mg by mouth daily.      [name] glucose blood (TRUETEST TEST) test strip 1 each by Other route 2 (two) times daily. Use as instructed       . losartan-hydrochlorothiazide (HYZAAR) 100-12.5 MG per tablet Take 1 tablet by mouth daily.  90 tablet  3  . Magnesium 250 MG TABS  Take 1 tablet by mouth daily.        . metoprolol tartrate (LOPRESSOR) 25 MG tablet Take 1 tablet (25 mg total) by mouth 2 (two) times daily.  180 tablet  3  . Multiple Vitamin (MULTIVITAMIN WITH MINERALS) TABS Take 1 tablet by mouth daily.      . pioglitazone (ACTOS) 45 MG tablet Take 45 mg by mouth daily.        . potassium chloride (KLOR-CON) 8 MEQ tablet Take 1 tablet (8 mEq total) by mouth daily.  90 tablet  3  . raloxifene (EVISTA) 60 MG tablet Take 60 mg by mouth daily.      . rosuvastatin (CRESTOR) 5 MG tablet Take 5 mg by mouth 3 (three) times a week. Mon, Weds & Fri      . Saxagliptin-Metformin 2.09-998 MG TB24 Take 1 tablet by mouth daily.      [name] warfarin (COUMADIN) 5 MG tablet Take 7.5-10 mg by mouth daily. Takes 10 mg on Mon, Weds, & Fri and 7.5 mg all other days of the week      . zaleplon (SONATA) 5 MG capsule Take 1 capsule (5 mg total) by mouth at bedtime.  30 capsule  3   No current facility-administered medications for this visit.    Allergies:    Allergies   Allergen Reactions  . Codeine Other (See Comments)    headache  . Codeine Sulfate     REACTION: unspecified    Social History:  The patient  reports that she has never smoked. She has never used smokeless tobacco. She reports that she does not drink alcohol or use illicit drugs.   ROS:  Please see the history of present illness.   All other systems reviewed and negative.   PHYSICAL EXAM: VS:  BP 118/73  Pulse 140  Ht 5\' 5"  (1.651 m)  Wt 173 lb 1.9 oz (78.527 kg)  BMI 28.81 kg/m2 Well nourished, well developed, in no acute distress HEENT:  Large right periorbital ecchymosis Neck: + JVD Cardiac:  normal S1, S2; irregularly irregular rhythm; no murmur Lungs:  clear to auscultation bilaterally, no wheezing, rhonchi or rales Abd: soft, nontender, no hepatomegaly Ext: no edema MSK:  Right wrist cast in place Skin: warm and dry Neuro:  CNs 2-12 intact, no focal abnormalities noted  EKG:  AFib, HR 134, LAD, lateral TWI     Lab Results  Component Value Date   INR 3.9 08/08/2012   INR 2.9 07/11/2012   INR 2.1 06/20/2012    Head CT without Contrast 08/08/2012: IMPRESSION: Right frontal subcutaneous hematoma without underlying fracture or intracranial hemorrhage.   ASSESSMENT AND PLAN:  1. Atrial Fibrillation with RVR:  Duration > 48 hours.  INR today is 3.9.  She fell recently and has a large periorbital hematoma.  Discussed with Dr. Sherryl Manges.  We obtained a head CT in the office.  This is negative for intracranial bleed.  Therefore, she will be set up for DCCV tomorrow.  Check a CBC, BMET, TSH.  She will need close follow up with Dr. Myrtis Bradley to determine her continued candidacy for coumadin given her frequent falls as well as whether or not she should be considered for anti-arrhythmics to maintain NSR.   2. Abnormality of Gait:  She has had 3 major falls in the last 3 mos.  She should follow up with her PCP for further evaluation.  She would likely benefit from Physical Therapy  evaluation.   3. Facial Hematoma:  As noted, a head CT was obtained today and is negative for intracranial bleed.   4. Hypertension:  Controlled.  Continue current therapy.  5. Disposition:  Follow up with Dr. Zackery Bradley in 2-3 weeks.  Signed, Tereso Newcomer, PA-C  3:50 PM 08/08/2012

## 2012-08-08 NOTE — Progress Notes (Signed)
[address], [address] [place], [place]  [address] Phone: [phone] Fax:  [phone]  Date:  08/08/2012   ID:  Tara Bradley, DOB 1922-09-02, MRN [id number]  PCP:  Carrie Mew, MD  Primary Cardiologist:  Dr. Zackery Barefoot     History of Present Illness: Tara Bradley is a 79 y.o. female who returns for follow up on AFib.  She has a hx of parox AFib, HL, HTN, DM2.  Echo 03/2011:  EF 65-70%.  She was evaluated in the ED 02/16/12 with AFib with RVR.  She underwent DCCV and discharged to home.  Last seen by Dr. Zackery Barefoot 03/2012.  She was in NSR at that time and was to f/u in 6 mos.  Patient was told she had an irregular rhythm about 2 weeks ago by her PCP.  She denies palpitations, dyspnea, chest pain, orthopnea, PND, edema, dizziness, syncope.  She fell this past weekend.  She has a large ecchymosis about her right eye.  Also has a right wrist fracture.  She has seen Dr. Thomasena Edis and has a cast on now.   Labs (10/11): TSH 4.38  Venous Dopplers (9/13): No left leg DVT  Labs (10/13): K 3.8, creatinine 1.10, Hgb 11.9  Wt Readings from Last 3 Encounters:  08/08/12 173 lb 1.9 oz (78.527 kg)  07/26/12 172 lb (78.019 kg)  04/13/12 166 lb (75.297 kg)     Past Medical History  Diagnosis Date  . Personal history of malignant neoplasm of breast   . Personal history of other disorders of nervous system and sense organs   . Unspecified closed fracture of pelvis   . Closed fracture of unspecified part of femur   . Long term (current) use of anticoagulants   . Atrial fibrillation     Paroxysmal, infrequent, Coumadin  . Hyperlipidemia   . Osteoporosis   . Arthritis   . Hypertension   . Diabetes mellitus   . Diverticulosis   . Diverticulitis   . Syncope   . Chest pain     Nuclear, April, 2008, no ischemia,  . Ejection fraction     EF 60%, echo, February, 2008  //   EF 65-70%, echo, November, 2012  . Aortic valve sclerosis     Echo, 2008  . Warfarin  anticoagulation   . Bradycardia     October, 2012    Current Outpatient Prescriptions  Medication Sig Dispense Refill  . ALPRAZolam (XANAX) 0.5 MG tablet Take 1 tablet (0.5 mg total) by mouth at bedtime as needed. For sleep  30 tablet  3  . Ascorbic Acid (VITAMIN C PO) Take 1 tablet by mouth daily.      . B Complex-C (B-COMPLEX WITH VITAMIN C) tablet Take 1 tablet by mouth daily.      . Calcium Carbonate-Vitamin D (CALTRATE 600+D) 600-400 MG-UNIT per tablet Take 1 tablet by mouth daily.        . Cholecalciferol (VITAMIN D) 2000 UNITS tablet Take 2,000 Units by mouth daily.        . digoxin (LANOXIN) 0.125 MG tablet Take 0.5 tablets (0.0625 mg total) by mouth daily.  90 tablet  3  . diltiazem (CARDIZEM CD) 240 MG 24 hr capsule Take 240 mg by mouth daily.      [name] glucose blood (TRUETEST TEST) test strip 1 each by Other route 2 (two) times daily. Use as instructed       . losartan-hydrochlorothiazide (HYZAAR) 100-12.5 MG per tablet Take 1 tablet  by mouth daily.  90 tablet  3  . Magnesium 250 MG TABS Take 1 tablet by mouth daily.        . metoprolol tartrate (LOPRESSOR) 25 MG tablet Take 1 tablet (25 mg total) by mouth 2 (two) times daily.  180 tablet  3  . Multiple Vitamin (MULTIVITAMIN WITH MINERALS) TABS Take 1 tablet by mouth daily.      . pioglitazone (ACTOS) 45 MG tablet Take 45 mg by mouth daily.        . potassium chloride (KLOR-CON) 8 MEQ tablet Take 1 tablet (8 mEq total) by mouth daily.  90 tablet  3  . raloxifene (EVISTA) 60 MG tablet Take 60 mg by mouth daily.      . rosuvastatin (CRESTOR) 5 MG tablet Take 5 mg by mouth 3 (three) times a week. Mon, Weds & Fri      . Saxagliptin-Metformin 2.09-998 MG TB24 Take 1 tablet by mouth daily.      [name] warfarin (COUMADIN) 5 MG tablet Take 7.5-10 mg by mouth daily. Takes 10 mg on Mon, Weds, & Fri and 7.5 mg all other days of the week      . zaleplon (SONATA) 5 MG capsule Take 1 capsule (5 mg total) by mouth at bedtime.  30 capsule  3   No  current facility-administered medications for this visit.    Allergies:    Allergies  Allergen Reactions  . Codeine Other (See Comments)    headache  . Codeine Sulfate     REACTION: unspecified    Social History:  The patient  reports that she has never smoked. She has never used smokeless tobacco. She reports that she does not drink alcohol or use illicit drugs.   ROS:  Please see the history of present illness.   All other systems reviewed and negative.   PHYSICAL EXAM: VS:  BP 118/73  Pulse 140  Ht 5\' 5"  (1.651 m)  Wt 173 lb 1.9 oz (78.527 kg)  BMI 28.81 kg/m2 Well nourished, well developed, in no acute distress HEENT:  Large right periorbital ecchymosis Neck: + JVD Cardiac:  normal S1, S2; irregularly irregular rhythm; no murmur Lungs:  clear to auscultation bilaterally, no wheezing, rhonchi or rales Abd: soft, nontender, no hepatomegaly Ext: no edema MSK:  Right wrist cast in place Skin: warm and dry Neuro:  CNs 2-12 intact, no focal abnormalities noted  EKG:  AFib, HR 134, LAD, lateral TWI     Lab Results  Component Value Date   INR 3.9 08/08/2012   INR 2.9 07/11/2012   INR 2.1 06/20/2012    Head CT without Contrast 08/08/2012: IMPRESSION: Right frontal subcutaneous hematoma without underlying fracture or intracranial hemorrhage.   ASSESSMENT AND PLAN:  1. Atrial Fibrillation with RVR:  Duration > 48 hours.  INR today is 3.9.  She fell recently and has a large periorbital hematoma.  Discussed with Dr. Sherryl Manges.  We obtained a head CT in the office.  This is negative for intracranial bleed.  Therefore, she will be set up for DCCV tomorrow.  Check a CBC, BMET, TSH.  She will need close follow up with Dr. Myrtis Ser to determine her continued candidacy for coumadin given her frequent falls as well as whether or not she should be considered for anti-arrhythmics to maintain NSR.   2. Abnormality of Gait:  She has had 3 major falls in the last 3 mos.  She should follow up  with her PCP for further evaluation.  She would likely benefit from Physical Therapy evaluation.   3. Facial Hematoma:  As noted, a head CT was obtained today and is negative for intracranial bleed.   4. Hypertension:  Controlled.  Continue current therapy.  5. Disposition:  Follow up with Dr. Zackery Barefoot in 2-3 weeks.  Signed, Tereso Newcomer, PA-C  3:50 PM 08/08/2012

## 2012-08-08 NOTE — Patient Instructions (Addendum)
Your physician recommends that you return for lab work in: today (bmet, cbc, tsh)  Non-Cardiac CT scanning, (CAT scanning), is a noninvasive, special x-ray that produces cross-sectional images of the body using x-rays and a computer. CT scans help physicians diagnose and treat medical conditions. For some CT exams, a contrast material is used to enhance visibility in the area of the body being studied. CT scans provide greater clarity and reveal more details than regular x-ray exams.  Your physician recommends that you schedule a follow-up appointment in: with your primary physician to discuss your frequent falls  Your physician recommends that you schedule a follow-up appointment in: first week of April with Dr Myrtis Ser

## 2012-08-09 ENCOUNTER — Ambulatory Visit (HOSPITAL_COMMUNITY): Payer: Medicare Other | Admitting: Anesthesiology

## 2012-08-09 ENCOUNTER — Ambulatory Visit (HOSPITAL_COMMUNITY)
Admission: RE | Admit: 2012-08-09 | Discharge: 2012-08-09 | Disposition: A | Discharge status: Home / Self Care | Payer: Medicare Other | Source: Ambulatory Visit | Attending: Internal Medicine | Admitting: Internal Medicine

## 2012-08-09 ENCOUNTER — Encounter (HOSPITAL_COMMUNITY): Payer: Self-pay | Admitting: Anesthesiology

## 2012-08-09 ENCOUNTER — Telehealth: Payer: Self-pay | Admitting: *Deleted

## 2012-08-09 ENCOUNTER — Encounter (HOSPITAL_COMMUNITY)
Admission: RE | Disposition: A | Discharge status: Home / Self Care | Payer: Self-pay | Source: Ambulatory Visit | Attending: Internal Medicine

## 2012-08-09 ENCOUNTER — Encounter (HOSPITAL_COMMUNITY): Payer: Self-pay | Admitting: *Deleted

## 2012-08-09 ENCOUNTER — Encounter: Payer: Medicare Other | Admitting: Family

## 2012-08-09 DIAGNOSIS — I10 Essential (primary) hypertension: Secondary | ICD-10-CM | POA: Insufficient documentation

## 2012-08-09 DIAGNOSIS — I4891 Unspecified atrial fibrillation: Secondary | ICD-10-CM

## 2012-08-09 DIAGNOSIS — E119 Type 2 diabetes mellitus without complications: Secondary | ICD-10-CM | POA: Insufficient documentation

## 2012-08-09 DIAGNOSIS — E785 Hyperlipidemia, unspecified: Secondary | ICD-10-CM | POA: Insufficient documentation

## 2012-08-09 HISTORY — DX: Malignant (primary) neoplasm, unspecified: C80.1

## 2012-08-09 HISTORY — PX: CARDIOVERSION: SHX1299

## 2012-08-09 LAB — CBC WITH DIFFERENTIAL/PLATELET
Basophils Absolute: 0 10*3/uL (ref 0.0–0.1)
Basophils Relative: 0.2 % (ref 0.0–3.0)
Eosinophils Absolute: 0.1 10*3/uL (ref 0.0–0.7)
Eosinophils Relative: 1.3 % (ref 0.0–5.0)
HCT: 39.9 % (ref 36.0–46.0)
Hemoglobin: 13.6 g/dL (ref 12.0–15.0)
Lymphocytes Relative: 21.4 % (ref 12.0–46.0)
Lymphs Abs: 1.3 10*3/uL (ref 0.7–4.0)
MCHC: 34 g/dL (ref 30.0–36.0)
MCV: 89.5 fl (ref 78.0–100.0)
Monocytes Absolute: 0.6 10*3/uL (ref 0.1–1.0)
Monocytes Relative: 10.1 % (ref 3.0–12.0)
Neutro Abs: 4.2 10*3/uL (ref 1.4–7.7)
Neutrophils Relative %: 67 % (ref 43.0–77.0)
Platelets: 194 10*3/uL (ref 150.0–400.0)
RBC: 4.46 Mil/uL (ref 3.87–5.11)
RDW: 15.6 % — ABNORMAL HIGH (ref 11.5–14.6)
WBC: 6.3 10*3/uL (ref 4.5–10.5)

## 2012-08-09 LAB — BASIC METABOLIC PANEL
BUN: 26 mg/dL — ABNORMAL HIGH (ref 6–23)
CO2: 30 mEq/L (ref 19–32)
Calcium: 9.4 mg/dL (ref 8.4–10.5)
Chloride: 100 mEq/L (ref 96–112)
Creatinine, Ser: 0.9 mg/dL (ref 0.4–1.2)
GFR: 67.06 mL/min (ref >60.00)
Glucose, Bld: 291 mg/dL — ABNORMAL HIGH (ref 70–99)
Potassium: 4.1 mEq/L (ref 3.5–5.1)
Sodium: 139 mEq/L (ref 135–145)

## 2012-08-09 LAB — PROTIME-INR
INR: 3.57 — ABNORMAL HIGH (ref 0.00–1.49)
Prothrombin Time: 33.6 seconds — ABNORMAL HIGH (ref 11.6–15.2)

## 2012-08-09 LAB — GLUCOSE, CAPILLARY: Glucose-Capillary: 169 mg/dL — ABNORMAL HIGH (ref 70–99)

## 2012-08-09 LAB — TSH: TSH: 2.13 u[IU]/mL (ref 0.35–5.50)

## 2012-08-09 SURGERY — CARDIOVERSION
Anesthesia: General

## 2012-08-09 MED ORDER — PROPOFOL 10 MG/ML IV BOLUS
INTRAVENOUS | Status: DC | PRN
  Administered 2012-08-09: 70 mg via INTRAVENOUS

## 2012-08-09 MED ORDER — SODIUM CHLORIDE 0.9 % IJ SOLN: 3.0000 mL | Freq: Two times a day (BID) | INTRAMUSCULAR | Status: DC

## 2012-08-09 MED ORDER — SODIUM CHLORIDE 0.9 % IJ SOLN: 3.0000 mL | INTRAMUSCULAR | Status: DC | PRN

## 2012-08-09 MED ORDER — SODIUM CHLORIDE 0.9 % IV SOLN
250.0000 mL | INTRAVENOUS | Status: DC
  Administered 2012-08-09: 500 mL via INTRAVENOUS

## 2012-08-09 NOTE — Transfer of Care (Signed)
Immediate Anesthesia Transfer of Care Note  Patient: Tara Bradley  Procedure(s) Performed: Procedure(s): CARDIOVERSION (N/A)  Patient Location: PACU  Anesthesia Type:General  Level of Consciousness: awake, alert , oriented and patient cooperative  Airway & Oxygen Therapy: Patient Spontanous Breathing and Patient connected to nasal cannula oxygen  Post-op Assessment: Report given to PACU RN, Post -op Vital signs reviewed and stable and Patient moving all extremities  Post vital signs: Reviewed and stable  Complications: No apparent anesthesia complications

## 2012-08-09 NOTE — Anesthesia Postprocedure Evaluation (Signed)
2 Anesthesia Post-op Note  Patient: Tara Bradley  Procedure(s) Performed: Procedure(s): CARDIOVERSION (N/A)  Patient Location: PACU  Anesthesia Type:General  Level of Consciousness: awake  Airway and Oxygen Therapy: Patient Spontanous Breathing  Post-op Pain: none  Post-op Assessment: Post-op Vital signs reviewed  Post-op Vital Signs: stable  Complications: No apparent anesthesia complications

## 2012-08-09 NOTE — CV Procedure (Signed)
Preop Dx afib Post op DX  NSR  Procedure  DC Cardioversion   Pt was sedated by anesthesia receiving 70 mg Propafol  A synchronized shock 120 joules restored sinus Rhythm  afib ensued and 120 synchronized shock was redelivered with brief sinus, nonsustained atrial tach/fib and then spontaneous reversion to sinus  Pt tolerated without difficulty

## 2012-08-09 NOTE — Telephone Encounter (Signed)
pt notified about lab results with verbal understanding  

## 2012-08-09 NOTE — Addendum Note (Signed)
Addendum created 08/09/12 1341 by Marni Griffon, CRNA   Modules edited: Charges VN

## 2012-08-09 NOTE — Interval H&P Note (Signed)
History and Physical Interval Note:  08/09/2012 12:05 PM  Tara Bradley  has presented today for surgery, with the diagnosis of a-fib  The various methods of treatment have been discussed with the patient and family. After consideration of risks, benefits and other options for treatment, the patient has consented to  Procedure(s): CARDIOVERSION (N/A) as a surgical intervention .  The patient's history has been reviewed, patient examined, no change in status, stable for surgery.  I have reviewed the patient's chart and labs.  Questions were answered to the patient's satisfaction.     Sherryl Manges

## 2012-08-09 NOTE — Anesthesia Preprocedure Evaluation (Addendum)
Anesthesia Evaluation  Patient identified by MRN, date of birth, ID band Patient awake    Reviewed: Allergy & Precautions, H&P , NPO status , Patient's Chart, lab work & pertinent test results, reviewed documented beta blocker date and time   Airway Mallampati: I TM Distance: >3 FB Neck ROM: Full    Dental   Pulmonary  breath sounds clear to auscultation        Cardiovascular hypertension, Pt. on medications and Pt. on home beta blockers Rhythm:Irregular Rate:Tachycardia     Neuro/Psych    GI/Hepatic   Endo/Other  diabetes, Poorly Controlled, Type 2  Renal/GU      Musculoskeletal   Abdominal   Peds  Hematology   Anesthesia Other Findings   Reproductive/Obstetrics                         Anesthesia Physical Anesthesia Plan  ASA: III  Anesthesia Plan: General   Post-op Pain Management:    Induction: Intravenous  Airway Management Planned: Mask and Natural Airway  Additional Equipment:   Intra-op Plan:   Post-operative Plan:   Informed Consent: I have reviewed the patients History and Physical, chart, labs and discussed the procedure including the risks, benefits and alternatives for the proposed anesthesia with the patient or authorized representative who has indicated his/her understanding and acceptance.     Plan Discussed with: CRNA and Surgeon  Anesthesia Plan Comments:         Anesthesia Quick Evaluation

## 2012-08-09 NOTE — Preoperative (Signed)
Beta Blockers   Reason not to administer Beta Blockers:Lopressor 0930 this morning

## 2012-08-09 NOTE — Telephone Encounter (Signed)
Message copied by Tarri Fuller on Wed Aug 09, 2012  6:06 PM ------      Message from: [place], [place] T      Created: Wed Aug 09, 2012  5:19 PM       K+, creatinine, Hgb, TSH normal.      Continue with current treatment plan.      Tereso Newcomer, PA-C  5:19 PM 08/09/2012 ------

## 2012-08-10 ENCOUNTER — Other Ambulatory Visit: Payer: Self-pay | Admitting: *Deleted

## 2012-08-10 ENCOUNTER — Encounter (HOSPITAL_COMMUNITY): Payer: Self-pay | Admitting: Internal Medicine

## 2012-08-10 ENCOUNTER — Telehealth: Payer: Self-pay | Admitting: *Deleted

## 2012-08-10 DIAGNOSIS — R42 Dizziness and giddiness: Secondary | ICD-10-CM

## 2012-08-10 DIAGNOSIS — R296 Repeated falls: Secondary | ICD-10-CM

## 2012-08-10 NOTE — Telephone Encounter (Signed)
done

## 2012-08-16 ENCOUNTER — Ambulatory Visit (INDEPENDENT_AMBULATORY_CARE_PROVIDER_SITE_OTHER): Payer: Medicare Other | Admitting: Family

## 2012-08-16 DIAGNOSIS — I4891 Unspecified atrial fibrillation: Secondary | ICD-10-CM

## 2012-08-16 LAB — POCT INR: INR: 2.7

## 2012-08-16 NOTE — Patient Instructions (Addendum)
Continue 7.5mg s daily except 10mg s on Mondays, Wednesdays and Fridays. Recheck in 3 weeks.   Anticoagulation Dose Instructions as of 08/16/2012     Tara Bradley Tue Wed Thu Fri Sat   New Dose 7.5 mg 10 mg 7.5 mg 10 mg 7.5 mg 10 mg 7.5 mg    Description       Continue 7.5mg s daily except 10mg s on Mondays, Wednesdays and Fridays. Recheck in 3 weeks.

## 2012-08-20 ENCOUNTER — Encounter: Payer: Self-pay | Admitting: Cardiology

## 2012-08-24 ENCOUNTER — Ambulatory Visit (INDEPENDENT_AMBULATORY_CARE_PROVIDER_SITE_OTHER): Payer: Medicare Other | Admitting: Cardiology

## 2012-08-24 ENCOUNTER — Encounter: Payer: Self-pay | Admitting: Cardiology

## 2012-08-24 VITALS — BP 128/87 | HR 125 | Ht 64.0 in | Wt 174.1 lb

## 2012-08-24 DIAGNOSIS — I4891 Unspecified atrial fibrillation: Secondary | ICD-10-CM

## 2012-08-24 DIAGNOSIS — Z7901 Long term (current) use of anticoagulants: Secondary | ICD-10-CM

## 2012-08-24 DIAGNOSIS — R001 Bradycardia, unspecified: Secondary | ICD-10-CM

## 2012-08-24 DIAGNOSIS — I498 Other specified cardiac arrhythmias: Secondary | ICD-10-CM

## 2012-08-24 MED ORDER — DIGOXIN 125 MCG PO TABS: 0.1250 mg | ORAL_TABLET | Freq: Every day | ORAL | Status: DC

## 2012-08-24 NOTE — Progress Notes (Signed)
HPI  The patient is seen today to followup atrial fibrillation. She was recently cardioverted as an outpatient. Unfortunately she did not hold sinus rhythm  For a long period. She notes that she seems to tolerate the atrial fib better than the past. She remains on Coumadin. She has had falls over time . She does also have variability in her INRs.  Allergies  Allergen Reactions  . Codeine Other (See Comments)    headache  . Codeine Sulfate     REACTION: unspecified    Current Outpatient Prescriptions  Medication Sig Dispense Refill  . ALPRAZolam (XANAX) 0.5 MG tablet Take 1 tablet (0.5 mg total) by mouth at bedtime as needed. For sleep  30 tablet  3  . Ascorbic Acid (VITAMIN C PO) Take 1 tablet by mouth daily.      . B Complex-C (B-COMPLEX WITH VITAMIN C) tablet Take 1 tablet by mouth daily.      . Calcium Carbonate-Vitamin D (CALTRATE 600+D) 600-400 MG-UNIT per tablet Take 1 tablet by mouth daily.        . Cholecalciferol (VITAMIN D) 2000 UNITS tablet Take 2,000 Units by mouth daily.        . digoxin (LANOXIN) 0.125 MG tablet Take 1 tablet (0.125 mg total) by mouth daily.  90 tablet  3  . diltiazem (CARDIZEM CD) 240 MG 24 hr capsule Take 240 mg by mouth daily.      [name] glucose blood (TRUETEST TEST) test strip 1 each by Other route 2 (two) times daily. Use as instructed       . losartan-hydrochlorothiazide (HYZAAR) 100-12.5 MG per tablet Take 1 tablet by mouth daily.  90 tablet  3  . Magnesium 250 MG TABS Take 1 tablet by mouth daily.        . metoprolol tartrate (LOPRESSOR) 25 MG tablet Take 1 tablet (25 mg total) by mouth 2 (two) times daily.  180 tablet  3  . Multiple Vitamin (MULTIVITAMIN WITH MINERALS) TABS Take 1 tablet by mouth daily.      . pioglitazone (ACTOS) 45 MG tablet Take 45 mg by mouth daily.        . potassium chloride (KLOR-CON) 8 MEQ tablet Take 1 tablet (8 mEq total) by mouth daily.  90 tablet  3  . raloxifene (EVISTA) 60 MG tablet Take 60 mg by mouth daily.      .  rosuvastatin (CRESTOR) 5 MG tablet Take 5 mg by mouth 3 (three) times a week. Mon, Weds & Fri      . Saxagliptin-Metformin 2.09-998 MG TB24 Take 1 tablet by mouth daily.      [name] warfarin (COUMADIN) 5 MG tablet Take 7.5-10 mg by mouth daily. Takes 10 mg on Mon, Weds, & Fri and 7.5 mg all other days of the week      . zaleplon (SONATA) 5 MG capsule Take 1 capsule (5 mg total) by mouth at bedtime.  30 capsule  3   No current facility-administered medications for this visit.    History   Social History  . Marital Status: Married    Spouse Name: N/A    Number of Children: 2  . Years of Education: N/A   Occupational History  . Piano Teacher    Social History Main Topics  . Smoking status: Never Smoker   . Smokeless tobacco: Never Used  . Alcohol Use: No  . Drug Use: No  . Sexually Active: Not Currently   Other Topics Concern  . Not  on file   Social History Narrative  . No narrative on file    Family History  Problem Relation Age of Onset  . Heart failure Mother 48  . Heart attack Father 15  . Diabetes Father   . Colon cancer Neg Hx     Past Medical History  Diagnosis Date  . Personal history of malignant neoplasm of breast   . Personal history of other disorders of nervous system and sense organs   . Unspecified closed fracture of pelvis   . Closed fracture of unspecified part of femur   . Long term (current) use of anticoagulants   . Atrial fibrillation     Paroxysmal, infrequent, Coumadin  . Hyperlipidemia   . Osteoporosis   . Arthritis   . Hypertension   . Diabetes mellitus   . Diverticulosis   . Diverticulitis   . Syncope   . Chest pain     Nuclear, April, 2008, no ischemia,  . Ejection fraction     EF 60%, echo, February, 2008  //   EF 65-70%, echo, November, 2012  . Aortic valve sclerosis     Echo, 2008  . Warfarin anticoagulation   . Bradycardia     October, 2012  . Cancer     Past Surgical History  Procedure Laterality Date  . Abdominal  hysterectomy  1995  . Shoulder surgery      left  . Mastectomy  1995    left  . Femur surgery    . Wrist surgery       x 2   . Tonsillectomy    . Eye surgery    . Cardioversion N/A 08/09/2012    Procedure: CARDIOVERSION;  Surgeon: Duke Salvia, MD;  Location: [facility] ENDOSCOPY;  Service: Cardiovascular;  Laterality: N/A;    Patient Active Problem List  Diagnosis  . DIABETES MELLITUS, TYPE II  . HYPERLIPIDEMIA, WITH HIGH HDL  . HYPERTENSION  . Atrial fibrillation  . URI  . OSTEOARTHRITIS  . OSTEOPOROSIS  . ADENOCARCINOMA, BREAST, HX OF  . SYNCOPE, HX OF  . MASTECTOMY, LEFT, HX OF  . ANXIETY STATE, UNSPECIFIED  . SCOLIOSIS, LUMBAR SPINE  . Diverticulosis of colon  . Warfarin anticoagulation  . Aortic valve sclerosis  . Syncope  . Bradycardia  . History of breast cancer in female  . Ejection fraction    ROS   Patient denies fever, chills, headache, sweats, rash, change in vision, change in hearing, chest pain, cough, nausea vomiting, urinary symptoms. All other systems are reviewed and are negative.  PHYSICAL EXAM  Patient is fully alert. She is here with her husband who she helps to keep from wandering. She is oriented to person time and place. Affect is normal. There is no jugulovenous distention. Lungs are clear. Respiratory effort is nonlabored. Cardiac exam reveals S1-S2. The rhythm is irregularly irregular. The abdomen is soft. There is no significant peripheral edema. There no musculoskeletal deformities. There are no skin rashes.  Filed Vitals:   08/24/12 1452  BP: 128/87  Pulse: 125  Height: 5\' 4"  (1.626 m)  Weight: 174 lb 1.9 oz (78.98 kg)   EKG is done today and reviewed by me. There is atrial fibrillation. The rate is elevated.  ASSESSMENT & PLAN

## 2012-08-24 NOTE — Assessment & Plan Note (Signed)
I'm concerned about the patient's ongoing atrial fibrillation rate. She is anticoagulated. She is actually not feeling poorly with the atrial fib at this time. However the rate is increased. Historically she has had bradycardia when she's been in sinus rhythm. Therefore I been hesitant to push her medicines even higher. She is already on diltiazem. She is on a beta blocker. She feels that higher doses of beta blocker she feels a little "woozy in the head". This is quite concerning because of her history of multiple falls. Therefore I will not increase the beta blocker. She is on a small dose of digoxin. Today we will increase her from 0.0625 milligrams to 0.125 mg daily. She will have an extra few doses over the next 2 days. She will have a follow up EKG in one week to see what her heart rate is. At this point I may have to consider antiarrhythmic medications. I've been hesitant in her case. There is no proven coronary disease. We may be able to use flecainide. However have to do a nuclear study first. However we'll not be safe to use this medicine unless we have better rate control to start with.

## 2012-08-24 NOTE — Assessment & Plan Note (Signed)
The patient has had variation in her INRs. She brought up the point that there is no way to reverse the newer agents. She falls frequently. One could argue that she possibly should be on no anticoagulant. For now she and I both agree that she will remain on Coumadin. Dr. Graciela Husbands has raised the question of whether or not we should put her on Eliquis. This has the lowest bleeding profile of any of the medications. Because of her age we could consider a reduced dose. She does not meet other criteria. She does not have renal dysfunction. I do not think that her weight is low enough to meet the weight criteria.

## 2012-08-24 NOTE — Assessment & Plan Note (Addendum)
She has had sinus bradycardia in the past. This will have to be kept in mind.   As part of today's evaluation I spent greater than 25 minutes with the patient's overall care. I spent more than half of this amount of time with direct contact with her. We had long discussion about choices of medications for atrial fib and choices of anticoagulants.

## 2012-08-24 NOTE — Patient Instructions (Addendum)
Your physician has recommended you make the following change in your medication: INCREASE your Digoxin to 0.125 mg twice daily for 3 days, then take 0.125 mg daily thereafter  Your physician recommends that you schedule a nurse appointment on:  08/31/12  Your physician recommends that you schedule a follow-up appointment in: Dr Myrtis Ser will determine your f/u date when he sees your ekg on 4/10

## 2012-08-31 ENCOUNTER — Ambulatory Visit (INDEPENDENT_AMBULATORY_CARE_PROVIDER_SITE_OTHER): Payer: Medicare Other | Admitting: *Deleted

## 2012-08-31 VITALS — BP 116/81 | HR 118 | Ht 64.0 in | Wt 171.5 lb

## 2012-08-31 DIAGNOSIS — I4891 Unspecified atrial fibrillation: Secondary | ICD-10-CM

## 2012-08-31 NOTE — Progress Notes (Signed)
Pt in for an EKG, BP 116/81 HR 118 beats/minute. EKG read per Dr. Myrtis Ser MD, A- fibrillation. Pt states she is doing find except, some times she feels lightheaded.  The last time she was seen by DR. Myrtis Ser , he increased the digoxin dose, and this make her feel like she does not have energy. Pt is aware that Dr. Myrtis Ser will review her EKG further, and will call her back.

## 2012-09-06 ENCOUNTER — Ambulatory Visit (INDEPENDENT_AMBULATORY_CARE_PROVIDER_SITE_OTHER): Payer: Medicare Other | Admitting: Family

## 2012-09-06 DIAGNOSIS — I4891 Unspecified atrial fibrillation: Secondary | ICD-10-CM

## 2012-09-06 LAB — POCT INR: INR: 3.5

## 2012-09-06 NOTE — Patient Instructions (Addendum)
Hold Coumadin today. Continue 7.5mg s daily except 10mg s on Mondays, Wednesdays and Fridays.  Recheck in 3 weeks.   Anticoagulation Dose Instructions as of 09/06/2012     Tara Bradley Tue Wed Thu Fri Sat   New Dose 7.5 mg 10 mg 7.5 mg 10 mg 7.5 mg 10 mg 7.5 mg    Description       Hold Coumadin today. Continue 7.5mg s daily except 10mg s on Mondays, Wednesdays and Fridays.  Recheck in 3 weeks.

## 2012-09-07 ENCOUNTER — Telehealth: Payer: Self-pay | Admitting: Cardiology

## 2012-09-07 NOTE — Telephone Encounter (Signed)
New Problem:    Patient called in wanting to know what Dr. Myrtis Ser recommendations from her latest EKG.  Please call back.

## 2012-09-07 NOTE — Telephone Encounter (Addendum)
**Note De-Identified  Obfuscation** At pts last OV with Dr. Myrtis Ser on 4/3 pt was advised to increase her Digoxin to 0.125 mg twice daily for 3 days, then take 0.125 mg daily thereafter and to have nurse visit/EKG on 4/10. Pt wants to know if Dr. Myrtis Ser has any recommendations and when should she f/u, please advise.

## 2012-09-12 NOTE — Telephone Encounter (Signed)
F/u     Pt calling because she hasn't heard back from anyone

## 2012-09-12 NOTE — Telephone Encounter (Signed)
Returned call to patient she stated she had a EKG 2 weeks ago and has not heard back on what to do.States she is weak and light headed.States her heart beat is irregular and heart rate not over 100 beats/min.Patient was told Dr.Katz and his nurse out of office today will send message to them for advice.

## 2012-09-13 NOTE — Telephone Encounter (Signed)
**Note De-Identified  Obfuscation** On pts last OV with Dr. Myrtis Ser on 4/3 pt was advised to increase her Digoxin to 0.125 mg twice daily for 3 days, then to take 0.125 mg daily thereafter. Also, pt was advised to come to office on 4/10 for a EKG (that EKG showed A-fib according to nurse visit note from 4/10) and that we would get back in touch with her concerning the medication adjustment in Digoxin and her f/u date. Please advise.

## 2012-09-14 ENCOUNTER — Ambulatory Visit
Admission: RE | Admit: 2012-09-14 | Discharge: 2012-09-14 | Disposition: A | Discharge status: Home / Self Care | Payer: Medicare Other | Source: Ambulatory Visit

## 2012-09-14 DIAGNOSIS — Z1231 Encounter for screening mammogram for malignant neoplasm of breast: Secondary | ICD-10-CM

## 2012-09-14 DIAGNOSIS — Z9012 Acquired absence of left breast and nipple: Secondary | ICD-10-CM

## 2012-09-18 NOTE — Telephone Encounter (Signed)
Follow Up     Pt is calling in following up on EKG results. Would like to speak to nurse.

## 2012-09-18 NOTE — Telephone Encounter (Signed)
Pt called because she  had an EKG done on 08/31/12 in this office per nurse visit. The plan was that Dr. Myrtis Ser was going to review the EKG further, and will call her back. Pt states she is still waiting to hear the MD recommendations. Pt states she still feel some light headedness once in a while. Pt is aware that Dr. Myrtis Ser and his nurse will be in this Wednesday.

## 2012-09-21 ENCOUNTER — Encounter: Payer: Self-pay | Admitting: Cardiology

## 2012-09-21 NOTE — Progress Notes (Signed)
   I called pt.  Felling better. Taking digoxin. Pulse 70-120. I told her we would call with f/u appt.  Maybe next week.  Jerral Bonito, MD

## 2012-09-21 NOTE — Telephone Encounter (Signed)
I called pt today. She is fine.

## 2012-09-27 ENCOUNTER — Encounter: Payer: Self-pay | Admitting: Internal Medicine

## 2012-09-27 ENCOUNTER — Ambulatory Visit: Payer: Medicare Other | Attending: Internal Medicine

## 2012-09-27 ENCOUNTER — Ambulatory Visit (INDEPENDENT_AMBULATORY_CARE_PROVIDER_SITE_OTHER): Payer: Medicare Other | Admitting: Family

## 2012-09-27 DIAGNOSIS — I4891 Unspecified atrial fibrillation: Secondary | ICD-10-CM

## 2012-09-27 DIAGNOSIS — R269 Unspecified abnormalities of gait and mobility: Secondary | ICD-10-CM | POA: Insufficient documentation

## 2012-09-27 DIAGNOSIS — R262 Difficulty in walking, not elsewhere classified: Secondary | ICD-10-CM | POA: Insufficient documentation

## 2012-09-27 DIAGNOSIS — Z9181 History of falling: Secondary | ICD-10-CM | POA: Insufficient documentation

## 2012-09-27 DIAGNOSIS — M6281 Muscle weakness (generalized): Secondary | ICD-10-CM | POA: Insufficient documentation

## 2012-09-27 DIAGNOSIS — IMO0001 Reserved for inherently not codable concepts without codable children: Secondary | ICD-10-CM | POA: Insufficient documentation

## 2012-09-27 LAB — POCT INR: INR: 3.5

## 2012-09-27 NOTE — Patient Instructions (Addendum)
Hold Coumadin today. Continue 7.5mg s daily except 10mg s on Wednesdays only.  Recheck in 2 weeks.   Anticoagulation Dose Instructions as of 09/27/2012     Glynis Smiles Tue Wed Thu Fri Sat   New Dose 7.5 mg 7.5 mg 7.5 mg 10 mg 7.5 mg 7.5 mg 7.5 mg    Description       Hold Coumadin today. Continue 7.5mg s daily except 10mg s on Wednesdays only.  Recheck in 2 weeks.

## 2012-10-03 ENCOUNTER — Ambulatory Visit: Payer: Medicare Other | Admitting: Physical Therapy

## 2012-10-05 ENCOUNTER — Encounter: Payer: Medicare Other | Admitting: Physical Therapy

## 2012-10-09 ENCOUNTER — Other Ambulatory Visit: Payer: Self-pay | Admitting: *Deleted

## 2012-10-09 MED ORDER — RALOXIFENE HCL 60 MG PO TABS: 60.0000 mg | ORAL_TABLET | Freq: Every day | ORAL | Status: DC

## 2012-10-10 ENCOUNTER — Ambulatory Visit: Payer: Medicare Other | Admitting: Physical Therapy

## 2012-10-11 ENCOUNTER — Ambulatory Visit (INDEPENDENT_AMBULATORY_CARE_PROVIDER_SITE_OTHER): Payer: Medicare Other | Admitting: Family

## 2012-10-11 DIAGNOSIS — I4891 Unspecified atrial fibrillation: Secondary | ICD-10-CM

## 2012-10-11 LAB — POCT INR: INR: 2.1

## 2012-10-11 NOTE — Patient Instructions (Addendum)
Continue 7.5mg s daily except 10mg s on Wednesdays only.  Recheck in 3 weeks.  Anticoagulation Dose Instructions as of 10/11/2012     Tara Bradley Tue Wed Thu Fri Sat   New Dose 7.5 mg 7.5 mg 7.5 mg 10 mg 7.5 mg 7.5 mg 7.5 mg    Description       Continue 7.5mg s daily except 10mg s on Wednesdays only.  Recheck in 3 weeks.

## 2012-10-12 ENCOUNTER — Ambulatory Visit: Payer: Medicare Other | Admitting: Physical Therapy

## 2012-10-12 ENCOUNTER — Ambulatory Visit (INDEPENDENT_AMBULATORY_CARE_PROVIDER_SITE_OTHER): Payer: Medicare Other | Admitting: General Surgery

## 2012-10-12 ENCOUNTER — Encounter (INDEPENDENT_AMBULATORY_CARE_PROVIDER_SITE_OTHER): Payer: Self-pay | Admitting: General Surgery

## 2012-10-12 VITALS — BP 124/80 | HR 72 | Resp 16 | Ht 65.0 in | Wt 171.6 lb

## 2012-10-12 DIAGNOSIS — Z853 Personal history of malignant neoplasm of breast: Secondary | ICD-10-CM

## 2012-10-12 NOTE — Progress Notes (Signed)
Subjective:     Patient ID: Tara Bradley, female   DOB: [birth year], 79 y.o.   MRN: [id number]  HPI The patient is an 79 year old white female who is 15 years status post left mastectomy and sentinel node biopsy for a micromesh and 1 lymph node. Since her last visit she has had 2 cardioversions but still has an irregular heartbeat. She is followed by Dr. Myrtis Ser. She otherwise denies any chest wall pain she recently had a mammogram of the right breast that showed no evidence of malignancy  Review of Systems  Constitutional: Negative.   HENT: Negative.   Eyes: Negative.   Respiratory: Negative.   Cardiovascular: Negative.   Gastrointestinal: Negative.   Endocrine: Negative.   Genitourinary: Negative.   Musculoskeletal: Positive for myalgias and arthralgias.  Skin: Negative.   Allergic/Immunologic: Negative.   Neurological: Negative.   Hematological: Negative.   Psychiatric/Behavioral: Negative.        Objective:   Physical Exam  Constitutional: She is oriented to person, place, and time. She appears well-developed and well-nourished.  HENT:  Head: Normocephalic and atraumatic.  Eyes: Conjunctivae and EOM are normal. Pupils are equal, round, and reactive to light.  Neck: Normal range of motion. Neck supple.  Cardiovascular: Normal rate, regular rhythm and normal heart sounds.   Pulmonary/Chest: Effort normal and breath sounds normal.  There is no palpable mass in the left chest wall. There is no palpable mass in the right breast. There is no palpable axillary or supraclavicular cervical lymphadenopathy.  Abdominal: Soft. Bowel sounds are normal. She exhibits no mass. There is no tenderness.  Musculoskeletal: Normal range of motion.  Lymphadenopathy:    She has no cervical adenopathy.  Neurological: She is alert and oriented to person, place, and time.  Skin: Skin is warm and dry.  Psychiatric: She has a normal mood and affect. Her behavior is normal.       Assessment:      The patient is 15 years status post left mastectomy for breast cancer     Plan:     At this point she will continue to do regular self exams. We will plan to see her back in one year.

## 2012-10-12 NOTE — Patient Instructions (Signed)
Continue regular self exams  

## 2012-10-17 ENCOUNTER — Encounter: Payer: Medicare Other | Admitting: Physical Therapy

## 2012-10-23 ENCOUNTER — Encounter: Payer: Self-pay | Admitting: Cardiology

## 2012-10-23 ENCOUNTER — Ambulatory Visit (INDEPENDENT_AMBULATORY_CARE_PROVIDER_SITE_OTHER): Payer: Medicare Other | Admitting: Cardiology

## 2012-10-23 VITALS — BP 127/80 | HR 69 | Ht 65.0 in | Wt 173.8 lb

## 2012-10-23 DIAGNOSIS — I4891 Unspecified atrial fibrillation: Secondary | ICD-10-CM

## 2012-10-23 DIAGNOSIS — I358 Other nonrheumatic aortic valve disorders: Secondary | ICD-10-CM

## 2012-10-23 DIAGNOSIS — I359 Nonrheumatic aortic valve disorder, unspecified: Secondary | ICD-10-CM

## 2012-10-23 DIAGNOSIS — Z7901 Long term (current) use of anticoagulants: Secondary | ICD-10-CM

## 2012-10-23 MED ORDER — RIVAROXABAN 15 MG PO TABS: 15.0000 mg | ORAL_TABLET | Freq: Every day | ORAL | Status: DC

## 2012-10-23 NOTE — Patient Instructions (Addendum)
Your physician recommends that you schedule a follow-up appointment Wednesday 11/15/12 at 4:30 pm.    May take Metoprolol 25 mg extra if needed for fast heart beat    Stop Coumadin   Start Xarelto 15 mg daily with supper   May Exercise

## 2012-10-23 NOTE — Assessment & Plan Note (Signed)
She has mild aortic valvular disease. She does not need a followup echo at this time.

## 2012-10-23 NOTE — Assessment & Plan Note (Signed)
We had a long discussion about her atrial fibrillation. She wants to switch from Coumadin to Xarelto. We are proceeding with this. Her GFR is very close to 50. Therefore I decided to use 15 mg instead of 20 mg. I made it clear to her that exercising is not a problem. This seems to make her feel much better. I talked with her about possible antiarrhythmic drugs. I told her that I thought it was very reasonable to try amiodarone to see if we could return her to sinus rhythm and maintain it. However after an extended discussion she says that she can manage the way she is and this is what she wants to do for now. I told her I was okay with this.  As part of today's evaluation I spent greater than 25 minutes with her total care. More than half of this time was spent with direct discussion with her about her atrial fibrillation.

## 2012-10-23 NOTE — Progress Notes (Signed)
HPI   Patient is seen today to followup atrial fibrillation. She had been cardioverted in March, 2014. She reverted to atrial fib within one week. When I saw at that time it was not clear that she was extremely symptomatic. We decided to try rate control. Ultimately I added digoxin. Her rate is under better control. She is able to go about most activities. However she is very bothered by feeling the palpitations at nighttime when it's quiet. Her heart rate is not necessarily fast at that time. She also has significant stress in helping to care for her husband. Xanax helps with this in the evening but she does not want to take this drug regularly. She's not having any chest pain or significant shortness of breath.  Allergies  Allergen Reactions  . Codeine Other (See Comments)    headache  . Codeine Sulfate     REACTION: unspecified    Current Outpatient Prescriptions  Medication Sig Dispense Refill  . ALPRAZolam (XANAX) 0.5 MG tablet Take 1 tablet (0.5 mg total) by mouth at bedtime as needed. For sleep  30 tablet  3  . Ascorbic Acid (VITAMIN C PO) Take 1 tablet by mouth daily.      . B Complex-C (B-COMPLEX WITH VITAMIN C) tablet Take 1 tablet by mouth daily.      . Calcium Carbonate-Vitamin D (CALTRATE 600+D) 600-400 MG-UNIT per tablet Take 1 tablet by mouth daily.        . Cholecalciferol (VITAMIN D) 2000 UNITS tablet Take 2,000 Units by mouth daily.        . digoxin (LANOXIN) 0.125 MG tablet Take 1 tablet (0.125 mg total) by mouth daily.  90 tablet  3  . diltiazem (CARDIZEM CD) 240 MG 24 hr capsule Take 240 mg by mouth daily.      [name] glucose blood (TRUETEST TEST) test strip 1 each by Other route 2 (two) times daily. Use as instructed       . losartan-hydrochlorothiazide (HYZAAR) 100-12.5 MG per tablet Take 1 tablet by mouth daily.  90 tablet  3  . Magnesium 250 MG TABS Take 1 tablet by mouth daily.        . metoprolol tartrate (LOPRESSOR) 25 MG tablet Take 1 tablet (25 mg total) by mouth 2  (two) times daily.  180 tablet  3  . Multiple Vitamin (MULTIVITAMIN WITH MINERALS) TABS Take 1 tablet by mouth daily.      . pioglitazone (ACTOS) 45 MG tablet Take 45 mg by mouth daily.        . potassium chloride (KLOR-CON) 8 MEQ tablet Take 1 tablet (8 mEq total) by mouth daily.  90 tablet  3  . raloxifene (EVISTA) 60 MG tablet Take 1 tablet (60 mg total) by mouth daily.  90 tablet  3  . rosuvastatin (CRESTOR) 5 MG tablet Take 5 mg by mouth 3 (three) times a week. Mon, Weds & Fri      . Saxagliptin-Metformin 2.09-998 MG TB24 Take 1 tablet by mouth daily.      . zaleplon (SONATA) 5 MG capsule Take 1 capsule (5 mg total) by mouth at bedtime.  30 capsule  3  . Rivaroxaban (XARELTO) 15 MG TABS tablet Take 1 tablet (15 mg total) by mouth daily.  90 tablet  3   No current facility-administered medications for this visit.    History   Social History  . Marital Status: Married    Spouse Name: N/A    Number of Children: 2  .  Years of Education: N/A   Occupational History  . Piano Teacher    Social History Main Topics  . Smoking status: Never Smoker   . Smokeless tobacco: Never Used  . Alcohol Use: No  . Drug Use: No  . Sexually Active: Not Currently   Other Topics Concern  . Not on file   Social History Narrative  . No narrative on file    Family History  Problem Relation Age of Onset  . Heart failure Mother 52  . Heart attack Father 67  . Diabetes Father   . Colon cancer Neg Hx     Past Medical History  Diagnosis Date  . Personal history of malignant neoplasm of breast   . Personal history of other disorders of nervous system and sense organs   . Unspecified closed fracture of pelvis   . Closed fracture of unspecified part of femur   . Long term (current) use of anticoagulants   . Atrial fibrillation     Paroxysmal, infrequent, Coumadin  . Hyperlipidemia   . Osteoporosis   . Arthritis   . Hypertension   . Diabetes mellitus   . Diverticulosis   . Diverticulitis     . Syncope   . Chest pain     Nuclear, April, 2008, no ischemia,  . Ejection fraction     EF 60%, echo, February, 2008  //   EF 65-70%, echo, November, 2012  . Aortic valve sclerosis     Echo, 2008  . Warfarin anticoagulation   . Bradycardia     October, 2012  . Cancer     Past Surgical History  Procedure Laterality Date  . Abdominal hysterectomy  1995  . Shoulder surgery      left  . Mastectomy  1995    left  . Femur surgery    . Wrist surgery       x 2   . Tonsillectomy    . Eye surgery    . Cardioversion N/A 08/09/2012    Procedure: CARDIOVERSION;  Surgeon: Duke Salvia, MD;  Location: [facility] ENDOSCOPY;  Service: Cardiovascular;  Laterality: N/A;    Patient Active Problem List   Diagnosis Date Noted  . Bradycardia     Priority: High  . Warfarin anticoagulation     Priority: High  . Aortic valve sclerosis     Priority: High  . Syncope     Priority: High  . Atrial fibrillation 01/09/2007    Priority: High  . Ejection fraction   . History of breast cancer in female 09/21/2011  . Diverticulosis of colon 08/20/2010  . ANXIETY STATE, UNSPECIFIED 06/15/2010  . SCOLIOSIS, LUMBAR SPINE 06/15/2010  . ADENOCARCINOMA, BREAST, HX OF 12/17/2008  . SYNCOPE, HX OF 12/17/2008  . MASTECTOMY, LEFT, HX OF 12/17/2008  . HYPERLIPIDEMIA, WITH HIGH HDL 07/15/2008  . URI 06/02/2007  . OSTEOARTHRITIS 05/08/2007  . DIABETES MELLITUS, TYPE II 02/01/2007  . HYPERTENSION 02/01/2007  . OSTEOPOROSIS 02/01/2007    ROS   Patient denies fever, chills, headache, sweats, rash, change in vision, change in hearing, chest pain, cough, nausea vomiting, urinary symptoms. All other systems are reviewed and are negative.  PHYSICAL EXAM  Patient is quite stable. She is completely oriented to person time and place. Affect is normal. She is unhappy that she is continuing to have this problem. Lungs are clear. Respiratory effort is nonlabored. Cardiac exam reveals an S1-S2. There no clicks or  significant murmurs. Abdomen is soft. There is no peripheral edema.  Filed Vitals:   10/23/12 1559  BP: 127/80  Pulse: 69  Height: 5\' 5"  (1.651 m)  Weight: 173 lb 12.8 oz (78.835 kg)   EKG is done today and reviewed by me. She has atrial fibrillation. There is decreased anterior R wave progression. There is no significant change. Heart rate is approximately 80.  ASSESSMENT & PLAN

## 2012-10-23 NOTE — Assessment & Plan Note (Signed)
Changing to Xarelto on October 23, 2012.

## 2012-10-27 ENCOUNTER — Encounter: Payer: Self-pay | Admitting: Internal Medicine

## 2012-10-27 ENCOUNTER — Other Ambulatory Visit: Payer: Self-pay | Admitting: *Deleted

## 2012-10-27 ENCOUNTER — Ambulatory Visit (INDEPENDENT_AMBULATORY_CARE_PROVIDER_SITE_OTHER): Payer: Medicare Other | Admitting: Internal Medicine

## 2012-10-27 VITALS — BP 110/62 | HR 76 | Temp 98.6°F | Resp 16 | Ht 65.0 in | Wt 174.0 lb

## 2012-10-27 DIAGNOSIS — I4891 Unspecified atrial fibrillation: Secondary | ICD-10-CM

## 2012-10-27 DIAGNOSIS — I10 Essential (primary) hypertension: Secondary | ICD-10-CM

## 2012-10-27 DIAGNOSIS — E119 Type 2 diabetes mellitus without complications: Secondary | ICD-10-CM

## 2012-10-27 DIAGNOSIS — F419 Anxiety disorder, unspecified: Secondary | ICD-10-CM

## 2012-10-27 MED ORDER — ALPRAZOLAM 0.5 MG PO TABS: 0.5000 mg | ORAL_TABLET | Freq: Every evening | ORAL | Status: DC | PRN

## 2012-10-27 NOTE — Progress Notes (Signed)
Subjective:    Patient ID: Tara Bradley, female    DOB: [birth year], 79 y.o.   MRN: [id number]  HPI Chronic AF now on xaralto per cardiology ( has aortic sclerosis) Still in AF DM stable  HTN stable Discussion of ablation vs medical treatment possible use of antiarrythmic   Rate 75-80 rate  Review of Systems  Constitutional: Negative for activity change, appetite change and fatigue.  HENT: Negative for ear pain, congestion, neck pain, postnasal drip and sinus pressure.   Eyes: Negative for redness and visual disturbance.  Respiratory: Negative for cough, shortness of breath and wheezing.   Cardiovascular: Positive for palpitations.  Gastrointestinal: Negative for abdominal pain and abdominal distention.  Genitourinary: Negative for dysuria, frequency and menstrual problem.  Musculoskeletal: Negative for myalgias, joint swelling and arthralgias.  Skin: Negative for rash and wound.  Neurological: Negative for dizziness, weakness and headaches.  Hematological: Negative for adenopathy. Does not bruise/bleed easily.  Psychiatric/Behavioral: Negative for sleep disturbance and decreased concentration.   Past Medical History  Diagnosis Date  . Personal history of malignant neoplasm of breast   . Personal history of other disorders of nervous system and sense organs   . Unspecified closed fracture of pelvis   . Closed fracture of unspecified part of femur   . Long term (current) use of anticoagulants   . Atrial fibrillation     Paroxysmal, infrequent, Coumadin  . Hyperlipidemia   . Osteoporosis   . Arthritis   . Hypertension   . Diabetes mellitus   . Diverticulosis   . Diverticulitis   . Syncope   . Chest pain     Nuclear, April, 2008, no ischemia,  . Ejection fraction     EF 60%, echo, February, 2008  //   EF 65-70%, echo, November, 2012  . Aortic valve sclerosis     Echo, 2008  . Warfarin anticoagulation   . Bradycardia     October, 2012  . Cancer     History    Social History  . Marital Status: Married    Spouse Name: N/A    Number of Children: 2  . Years of Education: N/A   Occupational History  . Piano Teacher    Social History Main Topics  . Smoking status: Never Smoker   . Smokeless tobacco: Never Used  . Alcohol Use: No  . Drug Use: No  . Sexually Active: Not Currently   Other Topics Concern  . Not on file   Social History Narrative  . No narrative on file    Past Surgical History  Procedure Laterality Date  . Abdominal hysterectomy  1995  . Shoulder surgery      left  . Mastectomy  1995    left  . Femur surgery    . Wrist surgery       x 2   . Tonsillectomy    . Eye surgery    . Cardioversion N/A 08/09/2012    Procedure: CARDIOVERSION;  Surgeon: Duke Salvia, MD;  Location: [facility] ENDOSCOPY;  Service: Cardiovascular;  Laterality: N/A;    Family History  Problem Relation Age of Onset  . Heart failure Mother 58  . Heart attack Father 44  . Diabetes Father   . Colon cancer Neg Hx     Allergies  Allergen Reactions  . Codeine Other (See Comments)    headache  . Codeine Sulfate     REACTION: unspecified    Current Outpatient Prescriptions on File Prior to Visit  Medication Sig Dispense Refill  . Ascorbic Acid (VITAMIN C PO) Take 1 tablet by mouth daily.      . B Complex-C (B-COMPLEX WITH VITAMIN C) tablet Take 1 tablet by mouth daily.      . Calcium Carbonate-Vitamin D (CALTRATE 600+D) 600-400 MG-UNIT per tablet Take 1 tablet by mouth daily.        . Cholecalciferol (VITAMIN D) 2000 UNITS tablet Take 2,000 Units by mouth daily.        . digoxin (LANOXIN) 0.125 MG tablet Take 1 tablet (0.125 mg total) by mouth daily.  90 tablet  3  . diltiazem (CARDIZEM CD) 240 MG 24 hr capsule Take 240 mg by mouth daily.      [name] glucose blood (TRUETEST TEST) test strip 1 each by Other route 2 (two) times daily. Use as instructed       . losartan-hydrochlorothiazide (HYZAAR) 100-12.5 MG per tablet Take 1 tablet by mouth daily.   90 tablet  3  . Magnesium 250 MG TABS Take 1 tablet by mouth daily.        . metoprolol tartrate (LOPRESSOR) 25 MG tablet Take 1 tablet (25 mg total) by mouth 2 (two) times daily.  180 tablet  3  . Multiple Vitamin (MULTIVITAMIN WITH MINERALS) TABS Take 1 tablet by mouth daily.      . pioglitazone (ACTOS) 45 MG tablet Take 45 mg by mouth daily.        . potassium chloride (KLOR-CON) 8 MEQ tablet Take 1 tablet (8 mEq total) by mouth daily.  90 tablet  3  . raloxifene (EVISTA) 60 MG tablet Take 1 tablet (60 mg total) by mouth daily.  90 tablet  3  . Rivaroxaban (XARELTO) 15 MG TABS tablet Take 1 tablet (15 mg total) by mouth daily.  90 tablet  3  . rosuvastatin (CRESTOR) 5 MG tablet Take 5 mg by mouth 3 (three) times a week. Mon, Weds & Fri      . Saxagliptin-Metformin 2.09-998 MG TB24 Take 1 tablet by mouth daily.      . zaleplon (SONATA) 5 MG capsule Take 1 capsule (5 mg total) by mouth at bedtime.  30 capsule  3   No current facility-administered medications on file prior to visit.    BP 110/62  Pulse 76  Temp(Src) 98.6 F (37 C)  Resp 16  Ht 5\' 5"  (1.651 m)  Wt 174 lb (78.926 kg)  BMI 28.96 kg/m2       Objective:   Physical Exam  Nursing note and vitals reviewed. Constitutional: She is oriented to person, place, and time. She appears well-developed and well-nourished. No distress.  HENT:  Head: Normocephalic and atraumatic.  Right Ear: External ear normal.  Left Ear: External ear normal.  Nose: Nose normal.  Mouth/Throat: Oropharynx is clear and moist.  Eyes: Conjunctivae and EOM are normal. Pupils are equal, round, and reactive to light.  Neck: Normal range of motion. Neck supple. No JVD present. No tracheal deviation present. No thyromegaly present.  Cardiovascular:  Murmur heard. Irregular rate and rythmn  Pulmonary/Chest: Effort normal and breath sounds normal. She has no wheezes. She exhibits no tenderness.  Abdominal: Soft. Bowel sounds are normal.   Musculoskeletal: Normal range of motion. She exhibits no edema and no tenderness.  Lymphadenopathy:    She has no cervical adenopathy.  Neurological: She is alert and oriented to person, place, and time. She has normal reflexes. No cranial nerve deficit.  Skin: Skin is warm and dry.  She is not diaphoretic.  Psychiatric: She has a normal mood and affect. Her behavior is normal.          Assessment & Plan:  The patient is not longer on  Coumadin stable on xarelto  The patient has been displeased with cardiology service DM stable HTN Possible candidate for flecainide   .

## 2012-10-27 NOTE — Patient Instructions (Addendum)
The patient is instructed to continue all medications as prescribed. Schedule followup with check out clerk upon leaving the clinic  

## 2012-11-01 ENCOUNTER — Ambulatory Visit (HOSPITAL_COMMUNITY): Payer: Self-pay

## 2012-11-01 ENCOUNTER — Encounter: Payer: Medicare Other | Admitting: Family

## 2012-11-01 ENCOUNTER — Telehealth: Payer: Self-pay | Admitting: Family

## 2012-11-01 ENCOUNTER — Ambulatory Visit: Payer: Self-pay | Admitting: Family

## 2012-11-01 DIAGNOSIS — I4891 Unspecified atrial fibrillation: Secondary | ICD-10-CM

## 2012-11-01 NOTE — Telephone Encounter (Signed)
Pt cancelled coum appt  6/12. Pt states she is no longer taking that anymore.

## 2012-11-02 ENCOUNTER — Encounter: Payer: Medicare Other | Admitting: Family

## 2012-11-14 ENCOUNTER — Telehealth: Payer: Self-pay | Admitting: Internal Medicine

## 2012-11-14 NOTE — Telephone Encounter (Signed)
Dr. Henrietta Hoover office calling. Pt had a coumadin visit here 11/01/12. Dr. Myrtis Ser had started pt on Xarelto on 6/2 - please call the office back to discuss/clarfiy medication regimen. Thank you.

## 2012-11-14 NOTE — Telephone Encounter (Signed)
Bland Span, will you please call Dr. Henrietta Hoover office and explain, in case they have anymore questions. Thank you.

## 2012-11-14 NOTE — Telephone Encounter (Signed)
She did not have a visit. I discontinued her "episode of care" after finding out she was on Xarelto. That's why is shows up as an anticoag note.

## 2012-11-14 NOTE — Telephone Encounter (Signed)
Spoke with Tara Bradley to explain Padonda's note. She states that Dr. Myrtis Ser was concerned because he though she may have been taking both coumadin and Xarelto.   Situation cleared up. Episode of care was d/c at [facility] on 11/01/12

## 2012-11-15 ENCOUNTER — Ambulatory Visit (INDEPENDENT_AMBULATORY_CARE_PROVIDER_SITE_OTHER): Payer: Medicare Other | Admitting: Cardiology

## 2012-11-15 ENCOUNTER — Ambulatory Visit: Payer: Medicare Other | Admitting: Cardiology

## 2012-11-15 ENCOUNTER — Encounter: Payer: Self-pay | Admitting: Cardiology

## 2012-11-15 VITALS — BP 130/60 | HR 61 | Ht 65.0 in | Wt 170.8 lb

## 2012-11-15 DIAGNOSIS — I4891 Unspecified atrial fibrillation: Secondary | ICD-10-CM

## 2012-11-15 NOTE — Assessment & Plan Note (Addendum)
The patient is now doing well. She tolerates her atrial fibrillation. The rate is controlled. I have considered placing a Holter monitor but I do not feel this is necessary. This will be considered over time. She is tolerating her switch from Coumadin to Xarelto.   Also, I had mentioned the possibility of amiodarone previously. I'm not sure why I had not originally considered flecainide also. This will be kept in mind.

## 2012-11-15 NOTE — Assessment & Plan Note (Signed)
Coumadin has been switched to Xarelto.

## 2012-11-15 NOTE — Progress Notes (Signed)
HPI  patient is seen today to followup atrial fibrillation. When I saw her last I made it clear was okay for her to exercise. She seemed pleased by this and she is feeling better. She is use to the atrial fib now. We did switch her from Coumadin to Xarelto and she's tolerating it well.  Allergies  Allergen Reactions  . Codeine Other (See Comments)    headache  . Codeine Sulfate     REACTION: unspecified    Current Outpatient Prescriptions  Medication Sig Dispense Refill  . ALPRAZolam (XANAX) 0.5 MG tablet Take 1 tablet (0.5 mg total) by mouth at bedtime as needed. For sleep  30 tablet  3  . Ascorbic Acid (VITAMIN C PO) Take 1 tablet by mouth daily.      . B Complex-C (B-COMPLEX WITH VITAMIN C) tablet Take 1 tablet by mouth daily.      . Calcium Carbonate-Vitamin D (CALTRATE 600+D) 600-400 MG-UNIT per tablet Take 1 tablet by mouth daily.        . Cholecalciferol (VITAMIN D) 2000 UNITS tablet Take 2,000 Units by mouth daily.        . digoxin (LANOXIN) 0.125 MG tablet Take 1 tablet (0.125 mg total) by mouth daily.  90 tablet  3  . diltiazem (CARDIZEM CD) 240 MG 24 hr capsule Take 240 mg by mouth daily.      [name] glucose blood (TRUETEST TEST) test strip 1 each by Other route 2 (two) times daily. Use as instructed       . losartan-hydrochlorothiazide (HYZAAR) 100-12.5 MG per tablet Take 1 tablet by mouth daily.  90 tablet  3  . Magnesium 250 MG TABS Take 1 tablet by mouth daily.        . metoprolol tartrate (LOPRESSOR) 25 MG tablet Take 1 tablet (25 mg total) by mouth 2 (two) times daily.  180 tablet  3  . Multiple Vitamin (MULTIVITAMIN WITH MINERALS) TABS Take 1 tablet by mouth daily.      . pioglitazone (ACTOS) 45 MG tablet Take 45 mg by mouth daily.        . potassium chloride (KLOR-CON) 8 MEQ tablet Take 1 tablet (8 mEq total) by mouth daily.  90 tablet  3  . raloxifene (EVISTA) 60 MG tablet Take 1 tablet (60 mg total) by mouth daily.  90 tablet  3  . Rivaroxaban (XARELTO) 15 MG TABS  tablet Take 1 tablet (15 mg total) by mouth daily.  90 tablet  3  . rosuvastatin (CRESTOR) 5 MG tablet Take 5 mg by mouth 3 (three) times a week. Mon, Weds & Fri      . Saxagliptin-Metformin 2.09-998 MG TB24 Take 1 tablet by mouth daily.      . zaleplon (SONATA) 5 MG capsule Take 1 capsule (5 mg total) by mouth at bedtime.  30 capsule  3   No current facility-administered medications for this visit.    History   Social History  . Marital Status: Married    Spouse Name: N/A    Number of Children: 2  . Years of Education: N/A   Occupational History  . Piano Teacher    Social History Main Topics  . Smoking status: Never Smoker   . Smokeless tobacco: Never Used  . Alcohol Use: No  . Drug Use: No  . Sexually Active: Not Currently   Other Topics Concern  . Not on file   Social History Narrative  . No narrative on file  Family History  Problem Relation Age of Onset  . Heart failure Mother 5  . Heart attack Father 55  . Diabetes Father   . Colon cancer Neg Hx     Past Medical History  Diagnosis Date  . Personal history of malignant neoplasm of breast   . Personal history of other disorders of nervous system and sense organs   . Unspecified closed fracture of pelvis   . Closed fracture of unspecified part of femur   . Long term (current) use of anticoagulants   . Atrial fibrillation     Paroxysmal, infrequent, Coumadin  . Hyperlipidemia   . Osteoporosis   . Arthritis   . Hypertension   . Diabetes mellitus   . Diverticulosis   . Diverticulitis   . Syncope   . Chest pain     Nuclear, April, 2008, no ischemia,  . Ejection fraction     EF 60%, echo, February, 2008  //   EF 65-70%, echo, November, 2012  . Aortic valve sclerosis     Echo, 2008  . Warfarin anticoagulation   . Bradycardia     October, 2012  . Cancer     Past Surgical History  Procedure Laterality Date  . Abdominal hysterectomy  1995  . Shoulder surgery      left  . Mastectomy  1995     left  . Femur surgery    . Wrist surgery       x 2   . Tonsillectomy    . Eye surgery    . Cardioversion N/A 08/09/2012    Procedure: CARDIOVERSION;  Surgeon: Duke Salvia, MD;  Location: [facility] ENDOSCOPY;  Service: Cardiovascular;  Laterality: N/A;    Patient Active Problem List   Diagnosis Date Noted  . Bradycardia     Priority: High  . Warfarin anticoagulation     Priority: High  . Aortic valve sclerosis     Priority: High  . Syncope     Priority: High  . Atrial fibrillation 01/09/2007    Priority: High  . Ejection fraction   . History of breast cancer in female 09/21/2011  . Diverticulosis of colon 08/20/2010  . ANXIETY STATE, UNSPECIFIED 06/15/2010  . SCOLIOSIS, LUMBAR SPINE 06/15/2010  . ADENOCARCINOMA, BREAST, HX OF 12/17/2008  . SYNCOPE, HX OF 12/17/2008  . MASTECTOMY, LEFT, HX OF 12/17/2008  . HYPERLIPIDEMIA, WITH HIGH HDL 07/15/2008  . URI 06/02/2007  . OSTEOARTHRITIS 05/08/2007  . DIABETES MELLITUS, TYPE II 02/01/2007  . HYPERTENSION 02/01/2007  . OSTEOPOROSIS 02/01/2007    ROS   patient denies fever, chills, headache, sweats, rash, change in vision, change in hearing, chest pain, cough, nausea vomiting, urinary symptoms. All other systems are reviewed and are negative.  PHYSICAL EXAM  patient is oriented to person time and place. Affect is normal. She's here with her husband as always. Lungs are clear. Respiratory effort is nonlabored. Cardiac exam reveals S1 and S2. There no clicks or significant murmurs. The abdomen is soft. There is no peripheral edema.  Filed Vitals:   11/15/12 1642  BP: 130/60  Pulse: 61  Height: 5\' 5"  (1.651 m)  Weight: 170 lb 12.8 oz (77.474 kg)  SpO2: 95%     ASSESSMENT & PLAN

## 2012-11-15 NOTE — Patient Instructions (Addendum)
**Note De-Identified  Obfuscation** Your physician recommends that you continue on your current medications as directed. Please refer to the Current Medication list given to you today.  Your physician recommends that you schedule a follow-up appointment in: 6 to 8 weeks

## 2012-11-16 ENCOUNTER — Telehealth: Payer: Self-pay | Admitting: Internal Medicine

## 2012-11-16 NOTE — Telephone Encounter (Signed)
Opened in error/kh 

## 2012-11-17 ENCOUNTER — Encounter (HOSPITAL_COMMUNITY): Payer: Medicare Other | Attending: Oncology | Admitting: Oncology

## 2012-11-17 ENCOUNTER — Encounter (HOSPITAL_COMMUNITY): Payer: Self-pay | Admitting: Oncology

## 2012-11-17 VITALS — BP 137/80 | HR 60 | Temp 97.2°F | Resp 18 | Wt 170.0 lb

## 2012-11-17 DIAGNOSIS — Z853 Personal history of malignant neoplasm of breast: Secondary | ICD-10-CM

## 2012-11-17 DIAGNOSIS — M810 Age-related osteoporosis without current pathological fracture: Secondary | ICD-10-CM

## 2012-11-17 NOTE — Progress Notes (Signed)
Stage II adenocarcinoma of the left breast presenting with a T1c, N1a M0 lesion s/p surgery on 08/27/1997 with a left mastectomy with 1/2 sentinel nodes microscopically only and her ca was ER 81% +, PR negative, Ki-67 low at 1.7%, her-2 neu negative and tumor size was 1.7 cm, no LVI and associated DCIS and she is s/p tamoxifen for 5 years.  She is now on Evista, CA++, and Vitamin D with zometa in the past for 5 years but now on the above first 3 meds with nice improvement in her most recent bone density.  DJD DM A FIb Multiple fractures in past Scoliosis   She is doing well on ROS from an oncology standpoint. She is having more trouble with her right knee DJD than anything else besides her blood sugar and she is trying to avoid a steroid injection into that joint.  Vs are stable Chest wall clear R breast negative for changes No adenopathy noted in any location Heart shows irregular rhythm and normal rate without S 3 Abd is soft and non-tender without hepatosplenomegaly Bowel sounds normal Lungs clear Legs without edema Arms without edema Alert and oriented  Recent labs in March reviewed  She has yearly f/u with Dr Carolynne Edouard and Dr Lovell Sheehan can perform repeat bone density going forward in 2015  Thus far she is free of recurrent disease and has a very good prognosis still.  Would continue her bone drugs as is and the Evista may have protective breast cancer effects going forward.

## 2012-11-17 NOTE — Patient Instructions (Addendum)
[facility] Discharge Instructions  RECOMMENDATIONS MADE BY THE CONSULTANT AND ANY TEST RESULTS WILL BE SENT TO YOUR REFERRING PHYSICIAN.  EXAM FINDINGS BY THE PHYSICIAN TODAY AND SIGNS OR SYMPTOMS TO REPORT TO CLINIC OR PRIMARY PHYSICIAN: Exam and discussion by MD.  Bonita Quin are doing well.  We will release you from the clinic.  Continue your evista and see your Primary Care for your bone density.  MEDICATIONS PRESCRIBED:  none  INSTRUCTIONS GIVEN AND DISCUSSED: Cardiologist at [facility] - Dr. Orvilla Fus  SPECIAL INSTRUCTIONS/FOLLOW-UP: As needed (you can contact Tobie Lords @ [phone])  Thank you for choosing [facility] to provide your oncology and hematology care.  To afford each patient quality time with our providers, please arrive at least 15 minutes before your scheduled appointment time.  With your help, our goal is to use those 15 minutes to complete the necessary work-up to ensure our physicians have the information they need to help with your evaluation and healthcare recommendations.    Effective January 1st, 2014, we ask that you re-schedule your appointment with our physicians should you arrive 10 or more minutes late for your appointment.  We strive to give you quality time with our providers, and arriving late affects you and other patients whose appointments are after yours.    Again, thank you for choosing [facility].  Our hope is that these requests will decrease the amount of time that you wait before being seen by our physicians.       _____________________________________________________________  Should you have questions after your visit to [facility], please contact our office at [phone] between the hours of 8:30 a.m. and 5:00 p.m.  Voicemails left after 4:30 p.m. will not be returned until the following business day.  For prescription refill requests, have your pharmacy contact our  office with your prescription refill request.

## 2012-12-27 ENCOUNTER — Other Ambulatory Visit: Payer: Self-pay

## 2013-01-02 ENCOUNTER — Ambulatory Visit (INDEPENDENT_AMBULATORY_CARE_PROVIDER_SITE_OTHER): Payer: Medicare Other | Admitting: Cardiology

## 2013-01-02 ENCOUNTER — Encounter: Payer: Self-pay | Admitting: *Deleted

## 2013-01-02 VITALS — BP 118/64 | HR 64 | Ht 65.0 in | Wt 167.0 lb

## 2013-01-02 DIAGNOSIS — I4891 Unspecified atrial fibrillation: Secondary | ICD-10-CM

## 2013-01-02 DIAGNOSIS — Z7901 Long term (current) use of anticoagulants: Secondary | ICD-10-CM

## 2013-01-02 DIAGNOSIS — I10 Essential (primary) hypertension: Secondary | ICD-10-CM

## 2013-01-02 NOTE — Assessment & Plan Note (Signed)
Her atrial fib rate is controlled. She's not having significant symptoms. She is on Xarelto. No further workup.

## 2013-01-02 NOTE — Assessment & Plan Note (Signed)
Blood pressures control. No change in therapy. 

## 2013-01-02 NOTE — Patient Instructions (Signed)
**Note De-identified  Obfuscation** Your physician recommends that you continue on your current medications as directed. Please refer to the Current Medication list given to you today.  Your physician wants you to follow-up in: 6 months. You will receive a reminder letter in the mail two months in advance. If you don't receive a letter, please call our office to schedule the follow-up appointment.  

## 2013-01-02 NOTE — Progress Notes (Signed)
HPI  Patient is seen today to followup her atrial fibrillation. She continues to do well. Originally she felt poorly with her atrial fib. More recently she has excepted in and she does quite well. She is happy that we switch her Coumadin to Xarelto. She's not having any chest pain or shortness of breath. Her heart rate is controlled. There is no need to do any Holter monitoring at this time.  Allergies  Allergen Reactions  . Ambien (Zolpidem Tartrate)     Hallucinations   . Codeine Other (See Comments)    headache  . Codeine Sulfate     REACTION: unspecified    Current Outpatient Prescriptions  Medication Sig Dispense Refill  . ALPRAZolam (XANAX) 0.5 MG tablet Take 1 tablet (0.5 mg total) by mouth at bedtime as needed. For sleep  30 tablet  3  . Ascorbic Acid (VITAMIN C PO) Take 1 tablet by mouth daily.      . B Complex-C (B-COMPLEX WITH VITAMIN C) tablet Take 1 tablet by mouth daily.      . Calcium Carbonate-Vitamin D (CALTRATE 600+D) 600-400 MG-UNIT per tablet Take 1 tablet by mouth daily.        . Cholecalciferol (VITAMIN D) 2000 UNITS tablet Take 1,000 Units by mouth daily.       . digoxin (LANOXIN) 0.125 MG tablet Take 1 tablet (0.125 mg total) by mouth daily.  90 tablet  3  . diltiazem (CARDIZEM CD) 240 MG 24 hr capsule Take 240 mg by mouth daily.      [name] glucose blood (TRUETEST TEST) test strip 1 each by Other route 2 (two) times daily. Use as instructed       . losartan-hydrochlorothiazide (HYZAAR) 100-12.5 MG per tablet Take 1 tablet by mouth daily.  90 tablet  3  . Magnesium 250 MG TABS Take 1 tablet by mouth daily.        . metoprolol tartrate (LOPRESSOR) 25 MG tablet Take 1 tablet (25 mg total) by mouth 2 (two) times daily.  180 tablet  3  . pioglitazone (ACTOS) 45 MG tablet Take 45 mg by mouth daily.        . potassium chloride (KLOR-CON) 8 MEQ tablet Take 1 tablet (8 mEq total) by mouth daily.  90 tablet  3  . raloxifene (EVISTA) 60 MG tablet Take 1 tablet (60 mg total)  by mouth daily.  90 tablet  3  . Rivaroxaban (XARELTO) 15 MG TABS tablet Take 1 tablet (15 mg total) by mouth daily.  90 tablet  3  . rosuvastatin (CRESTOR) 5 MG tablet Take 5 mg by mouth 3 (three) times a week. Mon, Weds & Fri      . Saxagliptin-Metformin 2.09-998 MG TB24 Take 1 tablet by mouth daily.      . zaleplon (SONATA) 5 MG capsule Take 1 capsule (5 mg total) by mouth at bedtime.  30 capsule  3   No current facility-administered medications for this visit.    History   Social History  . Marital Status: Married    Spouse Name: N/A    Number of Children: 2  . Years of Education: N/A   Occupational History  . Piano Teacher    Social History Main Topics  . Smoking status: Never Smoker   . Smokeless tobacco: Never Used  . Alcohol Use: No  . Drug Use: No  . Sexually Active: Not Currently   Other Topics Concern  . Not on file   Social History Narrative  Husband has advancing Dementia, which has been very difficult for her.    Family History  Problem Relation Age of Onset  . Congestive Heart Failure Mother 17  . Heart attack Father 43  . Diabetes type II Father   . Colon cancer Neg Hx   . Diabetes type II Brother     1/2  . Multiple myeloma Brother     2/2  . Diabetes type II Son     Past Medical History  Diagnosis Date  . Personal history of malignant neoplasm of breast   . Personal history of other disorders of nervous system and sense organs   . Unspecified closed fracture of pelvis 2006  . Closed fracture of unspecified part of femur 2005  . Long term (current) use of anticoagulants   . Atrial fibrillation     Paroxysmal, infrequent, Coumadin  . Hyperlipidemia   . Osteoporosis     femur fracture 2005, pelvic fracture 2006  . Arthritis   . Hypertension   . Diabetes mellitus, type 2   . Diverticulosis   . Diverticulitis   . Syncope   . Chest pain     Nuclear, April, 2008, no ischemia,  . Ejection fraction     EF 60%, echo, February, 2008  //   EF  65-70%, echo, November, 2012  . Aortic valve sclerosis     Echo, 2008  . Bradycardia     October, 2012  . Cancer   . S/P ORIF (open reduction internal fixation) fracture 2005    femur  . Chronic insomnia     situational stress  . Chronic anticoagulation     Past Surgical History  Procedure Laterality Date  . Total abdominal hysterectomy w/ bilateral salpingoophorectomy  1995  . Shoulder surgery      left  . Mastectomy  1995    left  . Femur surgery  2005    ORIF  . Wrist surgery       x 2   . Tonsillectomy    . Eye surgery    . Cardioversion N/A 08/09/2012    Procedure: CARDIOVERSION;  Surgeon: Duke Salvia, MD;  Location: [facility] ENDOSCOPY;  Service: Cardiovascular;  Laterality: N/A;  . Shoulder surgery  1979, 2003    Patient Active Problem List   Diagnosis Date Noted  . Bradycardia     Priority: High  . Aortic valve sclerosis     Priority: High  . Syncope     Priority: High  . Atrial fibrillation 01/09/2007    Priority: High  . Chronic anticoagulation   . Ejection fraction   . History of breast cancer in female 09/21/2011  . Diverticulosis of colon 08/20/2010  . ANXIETY STATE, UNSPECIFIED 06/15/2010  . SCOLIOSIS, LUMBAR SPINE 06/15/2010  . ADENOCARCINOMA, BREAST, HX OF 12/17/2008  . SYNCOPE, HX OF 12/17/2008  . MASTECTOMY, LEFT, HX OF 12/17/2008  . HYPERLIPIDEMIA, WITH HIGH HDL 07/15/2008  . URI 06/02/2007  . OSTEOARTHRITIS 05/08/2007  . DIABETES MELLITUS, TYPE II 02/01/2007  . HYPERTENSION 02/01/2007  . OSTEOPOROSIS 02/01/2007    ROS   Patient denies fever, chills, headache, sweats, rash, change in vision, change in hearing, chest pain, cough, nausea vomiting, urinary symptoms. All other systems are reviewed and are negative.  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 She is oriented to person time and place. Affect is normal. There is no jugulovenous distention. Lungs are clear. Respiratory effort is nonlabored. Cardiac  exam reveals S1 and S2. The rhythm is irregularly irregular. The abdomen is soft. There is no peripheral edema.  Filed Vitals:   01/02/13 1559  BP: 118/64  Pulse: 64  Height: 5\' 5"  (1.651 m)  Weight: 167 lb (75.751 kg)     ASSESSMENT & PLAN

## 2013-02-02 ENCOUNTER — Ambulatory Visit: Payer: Medicare Other | Admitting: Internal Medicine

## 2013-02-21 ENCOUNTER — Encounter: Payer: Self-pay | Admitting: Internal Medicine

## 2013-02-21 ENCOUNTER — Ambulatory Visit (INDEPENDENT_AMBULATORY_CARE_PROVIDER_SITE_OTHER): Payer: Medicare Other | Admitting: Internal Medicine

## 2013-02-21 VITALS — BP 126/78 | HR 72 | Temp 98.6°F | Resp 16 | Ht 65.0 in | Wt 168.0 lb

## 2013-02-21 DIAGNOSIS — E785 Hyperlipidemia, unspecified: Secondary | ICD-10-CM

## 2013-02-21 DIAGNOSIS — I4891 Unspecified atrial fibrillation: Secondary | ICD-10-CM

## 2013-02-21 DIAGNOSIS — I10 Essential (primary) hypertension: Secondary | ICD-10-CM

## 2013-02-21 DIAGNOSIS — E119 Type 2 diabetes mellitus without complications: Secondary | ICD-10-CM

## 2013-02-21 NOTE — Addendum Note (Signed)
Addended by: Stacie Glaze on: 02/21/2013 03:43 PM   Modules accepted: Orders

## 2013-02-21 NOTE — Progress Notes (Signed)
  Subjective:    Patient ID: Tara Bradley, female    DOB: 1923/03/28, 79 y.o.   MRN: [id number]  HPI The patient has noted increased a1c's due to a change in the medications and increased care responsibilities with her husband. Decreased sleep.   Review of Systems  Constitutional: Negative for activity change, appetite change and fatigue.  HENT: Negative for ear pain, congestion, neck pain, postnasal drip and sinus pressure.   Eyes: Negative for redness and visual disturbance.  Respiratory: Negative for cough, shortness of breath and wheezing.   Gastrointestinal: Negative for abdominal pain and abdominal distention.  Genitourinary: Negative for dysuria, frequency and menstrual problem.  Musculoskeletal: Negative for myalgias, joint swelling and arthralgias.  Skin: Negative for rash and wound.  Neurological: Negative for dizziness, weakness and headaches.  Hematological: Negative for adenopathy. Does not bruise/bleed easily.  Psychiatric/Behavioral: Negative for sleep disturbance and decreased concentration.       Objective:   Physical Exam  Nursing note and vitals reviewed. Constitutional: She is oriented to person, place, and time. She appears well-developed and well-nourished. No distress.  HENT:  Head: Normocephalic and atraumatic.  Right Ear: External ear normal.  Left Ear: External ear normal.  Nose: Nose normal.  Mouth/Throat: Oropharynx is clear and moist.  Eyes: Conjunctivae and EOM are normal. Pupils are equal, round, and reactive to light.  Neck: Normal range of motion. Neck supple. No JVD present. No tracheal deviation present. No thyromegaly present.  Cardiovascular: Normal rate, regular rhythm, normal heart sounds and intact distal pulses.   No murmur heard. Pulmonary/Chest: Effort normal and breath sounds normal. She has no wheezes. She exhibits no tenderness.  Abdominal: Soft. Bowel sounds are normal.  Musculoskeletal: Normal range of motion. She exhibits no  edema and no tenderness.  Lymphadenopathy:    She has no cervical adenopathy.  Neurological: She is alert and oriented to person, place, and time. She has normal reflexes. No cranial nerve deficit.  Skin: Skin is warm and dry. She is not diaphoretic.  Psychiatric: She has a normal mood and affect. Her behavior is normal.          Assessment & Plan:  Trial of farxiga 5 with good renal function Consider metformin and this class of drug given the rise in the a1c Stable other perameters Needs exercize.

## 2013-03-22 ENCOUNTER — Other Ambulatory Visit: Payer: Self-pay | Admitting: *Deleted

## 2013-03-22 DIAGNOSIS — I4891 Unspecified atrial fibrillation: Secondary | ICD-10-CM

## 2013-03-22 MED ORDER — DIGOXIN 125 MCG PO TABS: 0.1250 mg | ORAL_TABLET | Freq: Every day | ORAL | Status: DC

## 2013-03-29 ENCOUNTER — Other Ambulatory Visit: Payer: Self-pay

## 2013-03-30 ENCOUNTER — Other Ambulatory Visit: Payer: Self-pay

## 2013-03-30 MED ORDER — METOPROLOL TARTRATE 25 MG PO TABS: 25.0000 mg | ORAL_TABLET | Freq: Two times a day (BID) | ORAL | Status: DC

## 2013-04-03 ENCOUNTER — Encounter: Payer: Self-pay | Admitting: Cardiology

## 2013-04-03 ENCOUNTER — Ambulatory Visit (INDEPENDENT_AMBULATORY_CARE_PROVIDER_SITE_OTHER): Payer: Medicare Other | Admitting: Cardiology

## 2013-04-03 VITALS — BP 128/70 | HR 97 | Ht 65.5 in | Wt 164.8 lb

## 2013-04-03 DIAGNOSIS — I4891 Unspecified atrial fibrillation: Secondary | ICD-10-CM

## 2013-04-03 DIAGNOSIS — I358 Other nonrheumatic aortic valve disorders: Secondary | ICD-10-CM

## 2013-04-03 DIAGNOSIS — I498 Other specified cardiac arrhythmias: Secondary | ICD-10-CM

## 2013-04-03 DIAGNOSIS — I359 Nonrheumatic aortic valve disorder, unspecified: Secondary | ICD-10-CM

## 2013-04-03 DIAGNOSIS — R001 Bradycardia, unspecified: Secondary | ICD-10-CM

## 2013-04-03 NOTE — Progress Notes (Signed)
HPI The patient presents for followup of atrial fibrillation. This is the first time I have met her. She has been in this rhythm and did have cardioversion. However, she didn't feel well after this. She did have some elevated rates. She had some difficulty contacting office. She seems that she is tolerating fibrillation relatively well now. She says she probably doesn't notice a difference with a brief period she might of been in sinus. She might feel her heart racing a little at night but she takes a Xanax and she gets to sleep. She denies any chest pressure, neck or arm discomfort. She has no new shortness of breath, PND or orthopnea. She has had no weight gain or edema.  Allergies  Allergen Reactions  . Ambien [Zolpidem Tartrate]     Hallucinations   . Codeine Other (See Comments)    headache  . Codeine Sulfate     REACTION: unspecified    Current Outpatient Prescriptions  Medication Sig Dispense Refill  . ALPRAZolam (XANAX) 0.5 MG tablet Take 1 tablet (0.5 mg total) by mouth at bedtime as needed. For sleep  30 tablet  3  . Ascorbic Acid (VITAMIN C PO) Take 1 tablet by mouth daily.      . B Complex-C (B-COMPLEX WITH VITAMIN C) tablet Take 1 tablet by mouth daily.      . Calcium Carbonate-Vitamin D (CALTRATE 600+D) 600-400 MG-UNIT per tablet Take 1 tablet by mouth daily.        . Cholecalciferol (VITAMIN D) 2000 UNITS tablet Take 1,000 Units by mouth daily.       . Dapagliflozin Propanediol (FARXIGA) 5 MG TABS Take 1 tablet by mouth daily.      . digoxin (LANOXIN) 0.125 MG tablet Take 1 tablet (0.125 mg total) by mouth daily.  90 tablet  0  . diltiazem (CARDIZEM CD) 240 MG 24 hr capsule Take 240 mg by mouth daily.      [name] glucose blood (TRUETEST TEST) test strip 1 each by Other route 2 (two) times daily. Use as instructed       . losartan-hydrochlorothiazide (HYZAAR) 100-12.5 MG per tablet Take 1 tablet by mouth daily.  90 tablet  3  . Magnesium 250 MG TABS Take 1 tablet by mouth  daily.        . metoprolol tartrate (LOPRESSOR) 25 MG tablet Take 1 tablet (25 mg total) by mouth 2 (two) times daily.  180 tablet  3  . pioglitazone (ACTOS) 45 MG tablet Take 45 mg by mouth daily.        . potassium chloride (KLOR-CON) 8 MEQ tablet Take 1 tablet (8 mEq total) by mouth daily.  90 tablet  3  . raloxifene (EVISTA) 60 MG tablet Take 1 tablet (60 mg total) by mouth daily.  90 tablet  3  . Rivaroxaban (XARELTO) 15 MG TABS tablet Take 1 tablet (15 mg total) by mouth daily.  90 tablet  3  . rosuvastatin (CRESTOR) 5 MG tablet Take 5 mg by mouth 3 (three) times a week. Mon, Weds & Fri      . Saxagliptin-Metformin 2.09-998 MG TB24 Take 1 tablet by mouth daily.      . zaleplon (SONATA) 5 MG capsule Take 1 capsule (5 mg total) by mouth at bedtime.  30 capsule  3   No current facility-administered medications for this visit.    Past Medical History  Diagnosis Date  . Personal history of malignant neoplasm of breast   . Personal  history of other disorders of nervous system and sense organs   . Unspecified closed fracture of pelvis 2006  . Closed fracture of unspecified part of femur 2005  . Long term (current) use of anticoagulants   . Atrial fibrillation     Paroxysmal, infrequent, Coumadin  . Hyperlipidemia   . Osteoporosis     femur fracture 2005, pelvic fracture 2006  . Arthritis   . Hypertension   . Diabetes mellitus, type 2   . Diverticulosis   . Diverticulitis   . Syncope   . Chest pain     Nuclear, April, 2008, no ischemia,  . Ejection fraction     EF 60%, echo, February, 2008  //   EF 65-70%, echo, November, 2012  . Aortic valve sclerosis     Echo, 2008  . Bradycardia     October, 2012  . Cancer   . S/P ORIF (open reduction internal fixation) fracture 2005    femur  . Chronic insomnia     situational stress  . Chronic anticoagulation     Past Surgical History  Procedure Laterality Date  . Total abdominal hysterectomy w/ bilateral salpingoophorectomy  1995    . Shoulder surgery      left  . Mastectomy  1995    left  . Femur surgery  2005    ORIF  . Wrist surgery       x 2   . Tonsillectomy    . Eye surgery    . Cardioversion N/A 08/09/2012    Procedure: CARDIOVERSION;  Surgeon: Duke Salvia, MD;  Location: [facility] ENDOSCOPY;  Service: Cardiovascular;  Laterality: N/A;  . Shoulder surgery  1979, 2003   ROS:  As stated in the HPI and negative for all other systems.  PHYSICAL EXAM BP 128/70  Pulse 97  Ht 5' 5.5" (1.664 m)  Wt 164 lb 12.8 oz (74.753 kg)  BMI 27.00 kg/m2 GENERAL:  Well appearing HEENT:  Pupils equal round and reactive, fundi not visualized, oral mucosa unremarkable NECK:  No jugular venous distention, waveform within normal limits, carotid upstroke brisk and symmetric, no bruits, no thyromegaly LYMPHATICS:  No cervical, inguinal adenopathy LUNGS:  Clear to auscultation bilaterally BACK:  No CVA tenderness CHEST:  Unremarkable HEART:  PMI not displaced or sustained,S1 and S2 within normal limits, no S3, no clicks, no rubs, no murmurs, irregular ABD:  Flat, positive bowel sounds normal in frequency in pitch, no bruits, no rebound, no guarding, no midline pulsatile mass, no hepatomegaly, no splenomegaly EXT:  2 plus pulses throughout, no edema, no cyanosis no clubbing SKIN:  No rashes no nodules NEURO:  Cranial nerves II through XII grossly intact, motor grossly intact throughout PSYCH:  Cognitively intact, oriented to person place and time    EKG:  Atrial fibrillation, rate 91 leftward axis, nonspecific ST-T wave changes, probable old anteroseptal infarct. 04/03/2013   ASSESSMENT AND PLAN  ATRIAL FIB:  The patient  tolerates this rhythm and rate control and anticoagulation. We will continue with the meds as listed. I will place a Holter monitor to make sure she has reasonable rate control.  HTN:  The blood pressure is at target. No change in medications is indicated. We will continue with therapeutic lifestyle changes  (TLC).

## 2013-04-03 NOTE — Patient Instructions (Signed)
Your physician has recommended that you wear a 24 HR holter monitor. Holter monitors are medical devices that record the heart's electrical activity. Doctors most often use these monitors to diagnose arrhythmias. Arrhythmias are problems with the speed or rhythm of the heartbeat. The monitor is a small, portable device. You can wear one while you do your normal daily activities. This is usually used to diagnose what is causing palpitations/syncope (passing out).  Your physician recommends that you schedule a follow-up appointment in: 4 MONTHS  Your physician recommends that you continue on your current medications as directed. Please refer to the Current Medication list given to you today.

## 2013-04-13 ENCOUNTER — Encounter (INDEPENDENT_AMBULATORY_CARE_PROVIDER_SITE_OTHER): Payer: Medicare Other

## 2013-04-13 ENCOUNTER — Encounter: Payer: Self-pay | Admitting: Radiology

## 2013-04-13 DIAGNOSIS — I498 Other specified cardiac arrhythmias: Secondary | ICD-10-CM

## 2013-04-13 DIAGNOSIS — I4891 Unspecified atrial fibrillation: Secondary | ICD-10-CM

## 2013-04-13 DIAGNOSIS — R001 Bradycardia, unspecified: Secondary | ICD-10-CM

## 2013-04-13 DIAGNOSIS — I358 Other nonrheumatic aortic valve disorders: Secondary | ICD-10-CM

## 2013-04-13 NOTE — Progress Notes (Signed)
Patient ID: Tara Bradley, female   DOB: 06/05/1922, 79 y.o.   MRN: [id number] E cardio 24hr holter monitor applied

## 2013-04-26 ENCOUNTER — Telehealth: Payer: Self-pay | Admitting: Cardiology

## 2013-04-26 NOTE — Telephone Encounter (Signed)
New message     Want monitor results 

## 2013-04-26 NOTE — Telephone Encounter (Signed)
Pt aware monitor demonstrated "At Fib with rate OK"  No changes in medication needed.  She states understanding.

## 2013-04-30 ENCOUNTER — Other Ambulatory Visit: Payer: Self-pay | Admitting: Internal Medicine

## 2013-05-29 ENCOUNTER — Other Ambulatory Visit: Payer: Self-pay | Admitting: *Deleted

## 2013-05-29 DIAGNOSIS — I4891 Unspecified atrial fibrillation: Secondary | ICD-10-CM

## 2013-05-29 MED ORDER — RIVAROXABAN 15 MG PO TABS: 15.0000 mg | ORAL_TABLET | Freq: Every day | ORAL | Status: DC

## 2013-05-29 MED ORDER — DILTIAZEM HCL ER COATED BEADS 240 MG PO CP24: 240.0000 mg | ORAL_CAPSULE | Freq: Every day | ORAL | Status: DC

## 2013-05-29 MED ORDER — LOSARTAN POTASSIUM-HCTZ 100-12.5 MG PO TABS: 1.0000 | ORAL_TABLET | Freq: Every day | ORAL | Status: DC

## 2013-05-29 MED ORDER — POTASSIUM CHLORIDE ER 8 MEQ PO TBCR: 8.0000 meq | EXTENDED_RELEASE_TABLET | Freq: Every day | ORAL | Status: DC

## 2013-05-31 ENCOUNTER — Other Ambulatory Visit: Payer: Self-pay | Admitting: *Deleted

## 2013-05-31 ENCOUNTER — Telehealth: Payer: Self-pay | Admitting: Internal Medicine

## 2013-05-31 MED ORDER — RALOXIFENE HCL 60 MG PO TABS: 60.0000 mg | ORAL_TABLET | Freq: Every day | ORAL | Status: DC

## 2013-05-31 NOTE — Telephone Encounter (Signed)
[facility] requesting new script for raloxifene (EVISTA) 60 MG tablet

## 2013-06-01 ENCOUNTER — Other Ambulatory Visit: Payer: Self-pay

## 2013-06-01 DIAGNOSIS — I4891 Unspecified atrial fibrillation: Secondary | ICD-10-CM

## 2013-06-01 MED ORDER — METOPROLOL TARTRATE 25 MG PO TABS: 25.0000 mg | ORAL_TABLET | Freq: Two times a day (BID) | ORAL | Status: DC

## 2013-06-01 MED ORDER — RIVAROXABAN 15 MG PO TABS: 15.0000 mg | ORAL_TABLET | Freq: Every day | ORAL | Status: DC

## 2013-06-01 MED ORDER — DIGOXIN 125 MCG PO TABS: 0.1250 mg | ORAL_TABLET | Freq: Every day | ORAL | Status: DC

## 2013-06-22 ENCOUNTER — Encounter: Payer: Self-pay | Admitting: Internal Medicine

## 2013-06-22 ENCOUNTER — Ambulatory Visit (INDEPENDENT_AMBULATORY_CARE_PROVIDER_SITE_OTHER): Payer: Medicare Other | Admitting: Internal Medicine

## 2013-06-22 VITALS — BP 118/80 | HR 72 | Temp 98.2°F | Ht 66.5 in | Wt 170.0 lb

## 2013-06-22 DIAGNOSIS — I4891 Unspecified atrial fibrillation: Secondary | ICD-10-CM

## 2013-06-22 DIAGNOSIS — IMO0001 Reserved for inherently not codable concepts without codable children: Secondary | ICD-10-CM

## 2013-06-22 DIAGNOSIS — E1165 Type 2 diabetes mellitus with hyperglycemia: Principal | ICD-10-CM

## 2013-06-22 MED ORDER — TEMAZEPAM 30 MG PO CAPS: 30.0000 mg | ORAL_CAPSULE | Freq: Every evening | ORAL | Status: DC | PRN

## 2013-06-22 MED ORDER — EMPAGLIFLOZIN 25 MG PO TABS: 1.0000 | ORAL_TABLET | Freq: Every day | ORAL | Status: DC

## 2013-06-22 NOTE — Patient Instructions (Signed)
The patient is instructed to continue all medications as prescribed. Schedule followup with check out clerk upon leaving the clinic  

## 2013-06-22 NOTE — Progress Notes (Signed)
Pre visit review using our clinic review tool, if applicable. No additional management support is needed unless otherwise documented below in the visit note. 

## 2013-06-22 NOTE — Progress Notes (Signed)
 Subjective:    Patient ID: Tara Bradley, female    DOB: 05/29/1922, 80 y.o.   MRN: [id number]  HPI This is an 80-year-old female who is followed for a history of atrial fibrillation history of aortic valve sclerosis hypertension 2 diabetes. Invokanna trial and the A1C did not change much so she did not stay on it  iincreased insomnia  Formulary covers different drugs  Trial of Jardiance  Review of Systems  Constitutional: Negative for activity change, appetite change and fatigue.  HENT: Negative for congestion, ear pain, postnasal drip and sinus pressure.   Eyes: Negative for redness and visual disturbance.  Respiratory: Negative for cough, shortness of breath and wheezing.   Gastrointestinal: Negative for abdominal pain and abdominal distention.  Genitourinary: Negative for dysuria, frequency and menstrual problem.  Musculoskeletal: Negative for arthralgias, joint swelling, myalgias and neck pain.  Skin: Negative for rash and wound.  Neurological: Negative for dizziness, weakness and headaches.  Hematological: Negative for adenopathy. Does not bruise/bleed easily.  Psychiatric/Behavioral: Negative for sleep disturbance and decreased concentration.   Past Medical History  Diagnosis Date  . Personal history of malignant neoplasm of breast   . Personal history of other disorders of nervous system and sense organs   . Unspecified closed fracture of pelvis 2006  . Closed fracture of unspecified part of femur 2005  . Long term (current) use of anticoagulants   . Atrial fibrillation     Paroxysmal, infrequent, Coumadin  . Hyperlipidemia   . Osteoporosis     femur fracture 2005, pelvic fracture 2006  . Arthritis   . Hypertension   . Diabetes mellitus, type 2   . Diverticulosis   . Diverticulitis   . Syncope   . Chest pain     Nuclear, April, 2008, no ischemia,  . Ejection fraction     EF 60%, echo, February, 2008  //   EF 65-70%, echo, November, 2012  . Aortic valve  sclerosis     Echo, 2008  . Bradycardia     October, 2012  . Cancer   . S/P ORIF (open reduction internal fixation) fracture 2005    femur  . Chronic insomnia     situational stress  . Chronic anticoagulation     History   Social History  . Marital Status: Married    Spouse Name: N/A    Number of Children: 2  . Years of Education: N/A   Occupational History  . Piano Teacher    Social History Main Topics  . Smoking status: Never Smoker   . Smokeless tobacco: Never Used  . Alcohol Use: No  . Drug Use: No  . Sexual Activity: Not Currently   Other Topics Concern  . Not on file   Social History Narrative   Husband has advancing Dementia, which has been very difficult for her.    Past Surgical History  Procedure Laterality Date  . Total abdominal hysterectomy w/ bilateral salpingoophorectomy  1995  . Shoulder surgery      left  . Mastectomy  1995    left  . Femur surgery  2005    ORIF  . Wrist surgery       x 2   . Tonsillectomy    . Eye surgery    . Cardioversion N/A 08/09/2012    Procedure: CARDIOVERSION;  Surgeon: Steven C Klein, MD;  Location: [facility] ENDOSCOPY;  Service: Cardiovascular;  Laterality: N/A;  . Shoulder surgery  1979, 2003    Family History    Problem Relation Age of Onset  . Congestive Heart Failure Mother 84  . Heart attack Father 87  . Diabetes type II Father   . Colon cancer Neg Hx   . Diabetes type II Brother     1/2  . Multiple myeloma Brother     2/2  . Diabetes type II Son     Allergies  Allergen Reactions  . Ambien [Zolpidem Tartrate]     Hallucinations   . Codeine Other (See Comments)    headache  . Codeine Sulfate     REACTION: unspecified    Current Outpatient Prescriptions on File Prior to Visit  Medication Sig Dispense Refill  . ALPRAZolam (XANAX) 0.5 MG tablet TAKE 1 TABLET BY MOUTH AT BEDTIME AS NEEDED FOR SLEEP  30 tablet  3  . Ascorbic Acid (VITAMIN C PO) Take 1 tablet by mouth daily.      . B Complex-C  (B-COMPLEX WITH VITAMIN C) tablet Take 1 tablet by mouth daily.      . Calcium Carbonate-Vitamin D (CALTRATE 600+D) 600-400 MG-UNIT per tablet Take 1 tablet by mouth daily.        . Cholecalciferol (VITAMIN D) 2000 UNITS tablet Take 1,000 Units by mouth daily.       . digoxin (LANOXIN) 0.125 MG tablet Take 1 tablet (0.125 mg total) by mouth daily.  90 tablet  4  . glucose blood (TRUETEST TEST) test strip 1 each by Other route 2 (two) times daily. Use as instructed       . losartan-hydrochlorothiazide (HYZAAR) 100-12.5 MG per tablet Take 1 tablet by mouth daily.  90 tablet  3  . Magnesium 250 MG TABS Take 1 tablet by mouth daily.        . metoprolol tartrate (LOPRESSOR) 25 MG tablet Take 1 tablet (25 mg total) by mouth 2 (two) times daily.  180 tablet  4  . pioglitazone (ACTOS) 45 MG tablet Take 45 mg by mouth daily.        . potassium chloride (KLOR-CON) 8 MEQ tablet Take 1 tablet (8 mEq total) by mouth daily.  90 tablet  3  . raloxifene (EVISTA) 60 MG tablet Take 1 tablet (60 mg total) by mouth daily.  90 tablet  3  . Rivaroxaban (XARELTO) 15 MG TABS tablet Take 1 tablet (15 mg total) by mouth daily.  90 tablet  4  . rosuvastatin (CRESTOR) 5 MG tablet Take 5 mg by mouth 3 (three) times a week. Mon, Weds & Fri      . Saxagliptin-Metformin 2.09-998 MG TB24 Take 1 tablet by mouth daily.       No current facility-administered medications on file prior to visit.    BP 118/80  Pulse 72  Temp(Src) 98.2 F (36.8 C) (Oral)  Ht 5' 6.5" (1.689 m)  Wt 170 lb (77.111 kg)  BMI 27.03 kg/m2       Objective:   Physical Exam  Constitutional: She is oriented to person, place, and time. She appears well-developed and well-nourished.  HENT:  Head: Normocephalic and atraumatic.  Eyes: Conjunctivae are normal. Pupils are equal, round, and reactive to light.  Cardiovascular:  Murmur heard. irregular  Abdominal: Soft. Bowel sounds are normal.  Neurological: She is alert and oriented to person, place,  and time.          Assessment & Plan:  Stable AF  Add Jardiance for DM Stable on crestor samples given  

## 2013-06-26 ENCOUNTER — Telehealth: Payer: Self-pay

## 2013-06-26 NOTE — Telephone Encounter (Signed)
Relevant patient education assigned to patient using Emmi. ° °

## 2013-07-23 ENCOUNTER — Ambulatory Visit (INDEPENDENT_AMBULATORY_CARE_PROVIDER_SITE_OTHER): Payer: Medicare Other | Admitting: Family Medicine

## 2013-07-23 ENCOUNTER — Encounter: Payer: Self-pay | Admitting: Family Medicine

## 2013-07-23 VITALS — BP 120/76 | Temp 98.5°F | Wt 168.0 lb

## 2013-07-23 DIAGNOSIS — K5289 Other specified noninfective gastroenteritis and colitis: Secondary | ICD-10-CM

## 2013-07-23 DIAGNOSIS — R300 Dysuria: Secondary | ICD-10-CM

## 2013-07-23 DIAGNOSIS — B373 Candidiasis of vulva and vagina: Secondary | ICD-10-CM

## 2013-07-23 DIAGNOSIS — K529 Noninfective gastroenteritis and colitis, unspecified: Secondary | ICD-10-CM

## 2013-07-23 DIAGNOSIS — B3731 Acute candidiasis of vulva and vagina: Secondary | ICD-10-CM

## 2013-07-23 LAB — POCT URINALYSIS DIPSTICK
Bilirubin, UA: NEGATIVE
Nitrite, UA: NEGATIVE
Protein, UA: NEGATIVE
Spec Grav, UA: 1.01
Urobilinogen, UA: 0.2
pH, UA: 7

## 2013-07-23 MED ORDER — FLUCONAZOLE 150 MG PO TABS: 150.0000 mg | ORAL_TABLET | Freq: Once | ORAL | Status: DC

## 2013-07-23 MED ORDER — CIPROFLOXACIN HCL 250 MG PO TABS: 250.0000 mg | ORAL_TABLET | Freq: Two times a day (BID) | ORAL | Status: DC

## 2013-07-23 MED ORDER — ONDANSETRON HCL 4 MG PO TABS: 4.0000 mg | ORAL_TABLET | Freq: Three times a day (TID) | ORAL | Status: DC | PRN

## 2013-07-23 NOTE — Patient Instructions (Signed)
-  drink plenty of fluids with electrolytes such as gatorade or soup broth  -no dairy until better  -imodium for diarrhea  -zofran if needed for nausea  -over the counter yeast treatment 0 if this does nt resolve the symptoms can take the diflucan  -We have ordered labs or studies at this visit. It can take up to 1-2 weeks for results and processing. We will contact you with instructions IF your results are abnormal. Normal results will be released to your [facility]. If you have not heard from [place] or can not find your results in [facility] in 2 weeks please contact our office.  -follow up as needed

## 2013-07-23 NOTE — Progress Notes (Signed)
Pre visit review using our clinic review tool, if applicable. No additional management support is needed unless otherwise documented below in the visit note. 

## 2013-07-23 NOTE — Progress Notes (Signed)
Chief Complaint  Patient presents with  . Vaginitis  . vomiting and diarrhea    HPI:  Acute visit for:  Yeast infection: -saw PCP recently for her diabetes also sees Dr. Elyse Hsu -report thinks has yeast infection - vulvovag pruritis and a little curdy discharge and burns a little -reports thinks it is from the [place] as read this on the adverse effects  Nause/vomiting/diarrhea: -started last night -multiple episodes of watery diarrhea and vomiting -denies: fevers, abd pain, urinary symptoms, flank pain, blood in stools or emesis -able to tolerate POs   ROS: See pertinent positives and negatives per HPI.  Past Medical History  Diagnosis Date  . Personal history of malignant neoplasm of breast   . Personal history of other disorders of nervous system and sense organs   . Unspecified closed fracture of pelvis 2006  . Closed fracture of unspecified part of femur 2005  . Long term (current) use of anticoagulants   . Atrial fibrillation     Paroxysmal, infrequent, Coumadin  . Hyperlipidemia   . Osteoporosis     femur fracture 2005, pelvic fracture 2006  . Arthritis   . Hypertension   . Diabetes mellitus, type 2   . Diverticulosis   . Diverticulitis   . Syncope   . Chest pain     Nuclear, April, 2008, no ischemia,  . Ejection fraction     EF 60%, echo, February, 2008  //   EF 65-70%, echo, November, 2012  . Aortic valve sclerosis     Echo, 2008  . Bradycardia     October, 2012  . Cancer   . S/P ORIF (open reduction internal fixation) fracture 2005    femur  . Chronic insomnia     situational stress  . Chronic anticoagulation     Past Surgical History  Procedure Laterality Date  . Total abdominal hysterectomy w/ bilateral salpingoophorectomy  1995  . Shoulder surgery      left  . Mastectomy  1995    left  . Femur surgery  2005    ORIF  . Wrist surgery       x 2   . Tonsillectomy    . Eye surgery    . Cardioversion N/A 08/09/2012    Procedure:  CARDIOVERSION;  Surgeon: Deboraha Sprang, MD;  Location: [place];  Service: Cardiovascular;  Laterality: N/A;  . Shoulder surgery  1979, 2003    Family History  Problem Relation Age of Onset  . Congestive Heart Failure Mother 51  . Heart attack Father 3  . Diabetes type II Father   . Colon cancer Neg Hx   . Diabetes type II Brother     1/2  . Multiple myeloma Brother     2/2  . Diabetes type II Son     History   Social History  . Marital Status: Married    Spouse Name: N/A    Number of Children: 2  . Years of Education: N/A   Occupational History  . Piano Teacher    Social History Main Topics  . Smoking status: Never Smoker   . Smokeless tobacco: Never Used  . Alcohol Use: No  . Drug Use: No  . Sexual Activity: Not Currently   Other Topics Concern  . None   Social History Narrative   Husband has advancing Dementia, which has been very difficult for her.    Current outpatient prescriptions:ALPRAZolam (XANAX) 0.5 MG tablet, TAKE 1 TABLET BY MOUTH AT BEDTIME AS NEEDED  FOR SLEEP, Disp: 30 tablet, Rfl: 3;  Ascorbic Acid (VITAMIN C PO), Take 1 tablet by mouth daily., Disp: , Rfl: ;  B Complex-C (B-COMPLEX WITH VITAMIN C) tablet, Take 1 tablet by mouth daily., Disp: , Rfl: ;  Calcium Carbonate-Vitamin D (CALTRATE 600+D) 600-400 MG-UNIT per tablet, Take 1 tablet by mouth daily.  , Disp: , Rfl:  Cholecalciferol (VITAMIN D) 2000 UNITS tablet, Take 1,000 Units by mouth daily. , Disp: , Rfl: ;  digoxin (LANOXIN) 0.125 MG tablet, Take 1 tablet (0.125 mg total) by mouth daily., Disp: 90 tablet, Rfl: 4;  Empagliflozin (JARDIANCE) 25 MG TABS, Take 1 tablet by mouth daily., Disp: 90 tablet, Rfl: 3;  glucose blood (TRUETEST TEST) test strip, 1 each by Other route 2 (two) times daily. Use as instructed , Disp: , Rfl:  losartan-hydrochlorothiazide (HYZAAR) 100-12.5 MG per tablet, Take 1 tablet by mouth daily., Disp: 90 tablet, Rfl: 3;  Magnesium 250 MG TABS, Take 1 tablet by mouth daily.   , Disp: , Rfl: ;  metoprolol tartrate (LOPRESSOR) 25 MG tablet, Take 1 tablet (25 mg total) by mouth 2 (two) times daily., Disp: 180 tablet, Rfl: 4;  pioglitazone (ACTOS) 45 MG tablet, Take 45 mg by mouth daily.  , Disp: , Rfl:  potassium chloride (KLOR-CON) 8 MEQ tablet, Take 1 tablet (8 mEq total) by mouth daily., Disp: 90 tablet, Rfl: 3;  Rivaroxaban (XARELTO) 15 MG TABS tablet, Take 1 tablet (15 mg total) by mouth daily., Disp: 90 tablet, Rfl: 4;  rosuvastatin (CRESTOR) 5 MG tablet, Take 5 mg by mouth 3 (three) times a week. Mon, Weds & Fri, Disp: , Rfl: ;  Saxagliptin-Metformin 2.09-998 MG TB24, Take 1 tablet by mouth daily., Disp: , Rfl:  temazepam (RESTORIL) 30 MG capsule, Take 1 capsule (30 mg total) by mouth at bedtime as needed for sleep., Disp: 30 capsule, Rfl: 0;  fluconazole (DIFLUCAN) 150 MG tablet, Take 1 tablet (150 mg total) by mouth once., Disp: 1 tablet, Rfl: 0;  ondansetron (ZOFRAN) 4 MG tablet, Take 1 tablet (4 mg total) by mouth every 8 (eight) hours as needed for nausea or vomiting., Disp: 20 tablet, Rfl: 0  EXAM:  Filed Vitals:   07/23/13 1309  BP: 120/76  Temp: 98.5 F (36.9 C)    Body mass index is 26.71 kg/(m^2).  GENERAL: vitals reviewed and listed above, alert, oriented, appears well hydrated and in no acute distress  HEENT: atraumatic, conjunttiva clear, no obvious abnormalities on inspection of external nose and ears  NECK: no obvious masses on inspection  LUNGS: clear to auscultation bilaterally, no wheezes, rales or rhonchi, good air movement  CV: irr irr, no peripheral edema  ABD: soft, BS+, NTTP  MS: moves all extremities without noticeable abnormality  PSYCH: pleasant and cooperative, no obvious depression or anxiety  ASSESSMENT AND PLAN:  Discussed the following assessment and plan:  Gastroenteritis - Plan: ondansetron (ZOFRAN) 4 MG tablet  Vulvovaginal candidiasis - Plan: fluconazole (DIFLUCAN) 150 MG tablet  Dysuria - Plan: Culture,  Urine, POCT urinalysis dipstick  -supportive care and return ED precautions for likely viral gastroenteritis -OTC tx for likely vulvovaginal candidiasis and advised to follow up with her endocrinologist about her diabetes -urine dip and uclture given burning with urination to ensure no UTI showed likely mild dehydration and possible uti and she opted for cipro  -Patient advised to return or notify a doctor immediately if symptoms worsen or persist or new concerns arise.  Patient Instructions  -drink plenty of fluids  with electrolytes such as gatorade or soup broth  -no dairy until better  -imodium for diarrhea  -zofran if needed for nausea  -over the counter yeast treatment 0 if this does nt resolve the symptoms can take the diflucan  -We have ordered labs or studies at this visit. It can take up to 1-2 weeks for results and processing. We will contact you with instructions IF your results are abnormal. Normal results will be released to your [facility]. If you have not heard from [place] or can not find your results in [facility] in 2 weeks please contact our office.  -follow up as needed     Tara Rambert R.

## 2013-07-27 LAB — URINE CULTURE: Colony Count: >=100000

## 2013-07-30 ENCOUNTER — Other Ambulatory Visit: Payer: Self-pay | Admitting: Family Medicine

## 2013-07-30 ENCOUNTER — Telehealth: Payer: Self-pay | Admitting: Internal Medicine

## 2013-07-30 NOTE — Telephone Encounter (Signed)
Pt is needing new rx fluconazole (DIFLUCAN) 150 MG tablet sent to [facility], pt states she is out of meds and need today if possible.

## 2013-07-31 NOTE — Telephone Encounter (Signed)
Pt was confused about rx of the diflucan.  Directions on bottle stated to take once daily and she only got 1 pill.  Told pt that she only needed 1 pill. Pt verbalized understanding and had no questions.  No further assistance needed

## 2013-08-09 ENCOUNTER — Ambulatory Visit (INDEPENDENT_AMBULATORY_CARE_PROVIDER_SITE_OTHER): Payer: Medicare Other | Admitting: Cardiology

## 2013-08-09 ENCOUNTER — Encounter: Payer: Self-pay | Admitting: Cardiology

## 2013-08-09 VITALS — BP 120/76 | HR 101 | Ht 64.0 in | Wt 164.0 lb

## 2013-08-09 DIAGNOSIS — I358 Other nonrheumatic aortic valve disorders: Secondary | ICD-10-CM

## 2013-08-09 DIAGNOSIS — I10 Essential (primary) hypertension: Secondary | ICD-10-CM

## 2013-08-09 DIAGNOSIS — I4891 Unspecified atrial fibrillation: Secondary | ICD-10-CM

## 2013-08-09 DIAGNOSIS — I359 Nonrheumatic aortic valve disorder, unspecified: Secondary | ICD-10-CM

## 2013-08-09 MED ORDER — DILTIAZEM HCL ER COATED BEADS 300 MG PO CP24: 300.0000 mg | ORAL_CAPSULE | Freq: Every day | ORAL | Status: DC

## 2013-08-09 NOTE — Progress Notes (Signed)
HPI The patient presents for followup of atrial fibrillation.She seems that she is tolerating fibrillation relatively well now. She does not notice her fibrillation.  In the fall her Holter demonstrated reasonable rate control. She might feel her heart racing a little at night but she takes a Xanax and she gets to sleep. She denies any chest pressure, neck or arm discomfort. She has no new shortness of breath, PND or orthopnea. She has had no weight gain or edema.  She is under stress caring for a husband with dementia.   Allergies  Allergen Reactions  . Ambien [Zolpidem Tartrate]     Hallucinations   . Codeine Other (See Comments)    headache  . Codeine Sulfate     REACTION: unspecified    Current Outpatient Prescriptions  Medication Sig Dispense Refill  . ALPRAZolam (XANAX) 0.5 MG tablet TAKE 1 TABLET BY MOUTH AT BEDTIME AS NEEDED FOR SLEEP  30 tablet  3  . Ascorbic Acid (VITAMIN C PO) Take 1 tablet by mouth daily.      . B Complex-C (B-COMPLEX WITH VITAMIN C) tablet Take 1 tablet by mouth daily.      . Calcium Carbonate-Vitamin D (CALTRATE 600+D) 600-400 MG-UNIT per tablet Take 1 tablet by mouth daily.        . Cholecalciferol (VITAMIN D) 2000 UNITS tablet Take 1,000 Units by mouth daily.       . ciprofloxacin (CIPRO) 250 MG tablet Take 1 tablet (250 mg total) by mouth 2 (two) times daily.  6 tablet  0  . digoxin (LANOXIN) 0.125 MG tablet Take 1 tablet (0.125 mg total) by mouth daily.  90 tablet  4  . diltiazem (CARDIZEM CD) 240 MG 24 hr capsule Take 1 capsule by mouth daily.      . Empagliflozin (JARDIANCE) 25 MG TABS Take 1 tablet by mouth daily.  90 tablet  3  . fluconazole (DIFLUCAN) 150 MG tablet TAKE 1 TABLET BY MOUTH ONCE DAILY  1 tablet  0  . losartan-hydrochlorothiazide (HYZAAR) 100-12.5 MG per tablet Take 1 tablet by mouth daily.  90 tablet  3  . Magnesium 250 MG TABS Take 1 tablet by mouth daily.        . metoprolol tartrate (LOPRESSOR) 25 MG tablet Take 1 tablet (25  mg total) by mouth 2 (two) times daily.  180 tablet  4  . pioglitazone (ACTOS) 45 MG tablet Take 45 mg by mouth daily.        . potassium chloride (KLOR-CON) 8 MEQ tablet Take 1 tablet (8 mEq total) by mouth daily.  90 tablet  3  . raloxifene (EVISTA) 60 MG tablet Take 1 tablet by mouth daily.      . Rivaroxaban (XARELTO) 15 MG TABS tablet Take 1 tablet (15 mg total) by mouth daily.  90 tablet  4  . rosuvastatin (CRESTOR) 5 MG tablet Take 5 mg by mouth 3 (three) times a week. Mon, Weds & Fri      . Saxagliptin-Metformin 2.09-998 MG TB24 Take 1 tablet by mouth daily.      . temazepam (RESTORIL) 30 MG capsule Take 1 capsule (30 mg total) by mouth at bedtime as needed for sleep.  30 capsule  0   No current facility-administered medications for this visit.    Past Medical History  Diagnosis Date  . Personal history of malignant neoplasm of breast   . Personal history of other disorders of nervous system and sense organs   . Unspecified  closed fracture of pelvis 2006  . Closed fracture of unspecified part of femur 2005  . Long term (current) use of anticoagulants   . Atrial fibrillation     Paroxysmal, infrequent, Coumadin  . Hyperlipidemia   . Osteoporosis     femur fracture 2005, pelvic fracture 2006  . Arthritis   . Hypertension   . Diabetes mellitus, type 2   . Diverticulosis   . Diverticulitis   . Syncope   . Chest pain     Nuclear, April, 2008, no ischemia,  . Ejection fraction     EF 60%, echo, February, 2008  //   EF 65-70%, echo, November, 2012  . Aortic valve sclerosis     Echo, 2008  . Bradycardia     October, 2012  . Cancer   . S/P ORIF (open reduction internal fixation) fracture 2005    femur  . Chronic insomnia     situational stress  . Chronic anticoagulation     Past Surgical History  Procedure Laterality Date  . Total abdominal hysterectomy w/ bilateral salpingoophorectomy  1995  . Shoulder surgery      left  . Mastectomy  1995    left  . Femur surgery   2005    ORIF  . Wrist surgery       x 2   . Tonsillectomy    . Eye surgery    . Cardioversion N/A 08/09/2012    Procedure: CARDIOVERSION;  Surgeon: Deboraha Sprang, MD;  Location: [place];  Service: Cardiovascular;  Laterality: N/A;  . Shoulder surgery  1979, 2003   ROS:  As stated in the HPI and negative for all other systems.  PHYSICAL EXAM BP 120/76  Pulse 101  Ht 5\' 4"  (1.626 m)  Wt 164 lb (74.39 kg)  BMI 28.14 kg/m2 GENERAL:  Well appearing HEENT:  Pupils equal round and reactive, fundi not visualized, oral mucosa unremarkable NECK:  No jugular venous distention, waveform within normal limits, carotid upstroke brisk and symmetric, no bruits, no thyromegaly LUNGS:  Clear to auscultation bilaterally BACK:  No CVA tenderness CHEST:  Unremarkable HEART:  PMI not displaced or sustained,S1 and S2 within normal limits, no S3, no clicks, no rubs, no murmurs, irregular ABD:  Flat, positive bowel sounds normal in frequency in pitch, no bruits, no rebound, no guarding, no midline pulsatile mass, no hepatomegaly, no splenomegaly EXT:  2 plus pulses throughout, no edema, no cyanosis no clubbing SKIN:  No rashes no nodules  EKG:  Atrial fibrillation, rate 101 leftward axis, nonspecific ST-T wave changes, probable old anteroseptal infarct. No change from previous ECG.   08/09/2013   ASSESSMENT AND PLAN  ATRIAL FIB:  The patient  tolerates this rhythm and rate control and anticoagulation. However, the rate was increased when I walked her around.  She went into the 120s.  I will increase the Cardizem to 300 mg daily.  We will bring her back for a heart rate check in about 6 weeks.    HTN:  The blood pressure is at target. No change in medications is indicated. We will continue with therapeutic lifestyle changes (TLC).

## 2013-08-09 NOTE — Patient Instructions (Signed)
Please increase Cardizem to 300 mg a day Continue all other medications as listed.  Please return in 6 weeks for a nurse room visit to check your heart rate while walking around the office.  Follow up in 4 months with Dr Percival Spanish.  You will receive a letter in the mail 2 months before you are due.  Please call us when you receive this letter to schedule your follow up appointment.

## 2013-08-10 ENCOUNTER — Ambulatory Visit: Payer: Medicare Other | Admitting: Cardiology

## 2013-09-03 ENCOUNTER — Other Ambulatory Visit: Payer: Self-pay

## 2013-09-03 DIAGNOSIS — Z9012 Acquired absence of left breast and nipple: Secondary | ICD-10-CM

## 2013-09-03 DIAGNOSIS — Z1231 Encounter for screening mammogram for malignant neoplasm of breast: Secondary | ICD-10-CM

## 2013-09-05 ENCOUNTER — Other Ambulatory Visit: Payer: Self-pay | Admitting: Internal Medicine

## 2013-09-18 ENCOUNTER — Ambulatory Visit
Admission: RE | Admit: 2013-09-18 | Discharge: 2013-09-18 | Disposition: A | Discharge status: Home / Self Care | Payer: Medicare Other | Source: Ambulatory Visit

## 2013-09-18 DIAGNOSIS — Z9012 Acquired absence of left breast and nipple: Secondary | ICD-10-CM

## 2013-09-18 DIAGNOSIS — Z1231 Encounter for screening mammogram for malignant neoplasm of breast: Secondary | ICD-10-CM

## 2013-09-20 ENCOUNTER — Ambulatory Visit (INDEPENDENT_AMBULATORY_CARE_PROVIDER_SITE_OTHER): Payer: Medicare Other | Admitting: Nurse Practitioner

## 2013-09-20 VITALS — BP 118/66 | HR 81 | Wt 169.0 lb

## 2013-09-20 DIAGNOSIS — I4891 Unspecified atrial fibrillation: Secondary | ICD-10-CM

## 2013-09-20 NOTE — Progress Notes (Signed)
1.) Reason for visit: check heart rate while patient walks around office  2.) Name of MD requesting visit: Dr. Percival Spanish  3.) H&P: Patient experienced elevated heart rate when ambulated around office; Dr. Percival Spanish increased Cardizem to 300 mg daily and patient was rechecked today by RN  4.) ROS related to problem: Hx Atrial fibrillation; recent medication increase; patient denies complaints  5.) Assessment and plan per MD: Follow-up with Dr. Percival Spanish on July 14 at 3:00 pm at the [facility] office  6.) Provider sign-of(MD statement): Dr. Percival Spanish 7.)

## 2013-10-05 NOTE — Progress Notes (Signed)
   Patient ID: Tara Bradley, female    DOB: 1923-02-15, 80 y.o.   MRN: [id number]  HPI    Review of Systems    Physical Exam

## 2013-12-04 ENCOUNTER — Ambulatory Visit (INDEPENDENT_AMBULATORY_CARE_PROVIDER_SITE_OTHER): Payer: Medicare Other | Admitting: Cardiology

## 2013-12-04 ENCOUNTER — Encounter: Payer: Self-pay | Admitting: Cardiology

## 2013-12-04 VITALS — BP 140/80 | HR 80 | Ht 65.0 in | Wt 165.0 lb

## 2013-12-04 DIAGNOSIS — I10 Essential (primary) hypertension: Secondary | ICD-10-CM

## 2013-12-04 DIAGNOSIS — I358 Other nonrheumatic aortic valve disorders: Secondary | ICD-10-CM

## 2013-12-04 DIAGNOSIS — I359 Nonrheumatic aortic valve disorder, unspecified: Secondary | ICD-10-CM

## 2013-12-04 NOTE — Progress Notes (Signed)
HPI The patient presents for followup of atrial fibrillation.  Last fall she did have a Holter demonstrating reasonable rate control. She's been tolerating this rhythm well. However, I last saw her several weeks ago her heart rate was increased walking around the office and so I increased her Cardizem.  She came back for a heart rate check with ambulation on 4/30 in our office and her rate was in the 80s. She returns for followup.  She continues to be under stress taking care of her husband who has dementia.  She tolerated the increased dose of Cardizem. She has presyncope or syncope. She's had no chest pressure, neck or arm discomfort. She only notices her heart rate skipping at night she goes to bed that she take something to help her sleep and this is the problem. Her heart rate today is better controlled.  Allergies  Allergen Reactions  . Ambien [Zolpidem Tartrate]     Hallucinations   . Codeine Other (See Comments)    headache  . Codeine Sulfate     REACTION: unspecified    Current Outpatient Prescriptions  Medication Sig Dispense Refill  . ALPRAZolam (XANAX) 0.5 MG tablet TAKE 1 TABLET BY MOUTH AT BEDTIME AS NEEDED FOR SLEEP  30 tablet  2  . Ascorbic Acid (VITAMIN C PO) Take 1 tablet by mouth daily.      . B Complex-C (B-COMPLEX WITH VITAMIN C) tablet Take 1 tablet by mouth daily.      . Calcium Carbonate-Vitamin D (CALTRATE 600+D) 600-400 MG-UNIT per tablet Take 1 tablet by mouth daily.        . Cholecalciferol (VITAMIN D) 2000 UNITS tablet Take 1,000 Units by mouth daily.       . ciprofloxacin (CIPRO) 250 MG tablet Take 1 tablet (250 mg total) by mouth 2 (two) times daily.  6 tablet  0  . digoxin (LANOXIN) 0.125 MG tablet Take 1 tablet (0.125 mg total) by mouth daily.  90 tablet  4  . diltiazem (CARDIZEM CD) 300 MG 24 hr capsule Take 1 capsule (300 mg total) by mouth daily.  90 capsule  3  . Empagliflozin (JARDIANCE) 25 MG TABS Take 1 tablet by mouth daily.  90 tablet  3  .  fluconazole (DIFLUCAN) 150 MG tablet TAKE 1 TABLET BY MOUTH ONCE DAILY  1 tablet  0  . losartan-hydrochlorothiazide (HYZAAR) 100-12.5 MG per tablet Take 1 tablet by mouth daily.  90 tablet  3  . Magnesium 250 MG TABS Take 1 tablet by mouth daily.        . metoprolol tartrate (LOPRESSOR) 25 MG tablet Take 1 tablet (25 mg total) by mouth 2 (two) times daily.  180 tablet  4  . pioglitazone (ACTOS) 45 MG tablet Take 45 mg by mouth daily.        . potassium chloride (KLOR-CON) 8 MEQ tablet Take 1 tablet (8 mEq total) by mouth daily.  90 tablet  3  . raloxifene (EVISTA) 60 MG tablet Take 1 tablet by mouth daily.      . Rivaroxaban (XARELTO) 15 MG TABS tablet Take 1 tablet (15 mg total) by mouth daily.  90 tablet  4  . rosuvastatin (CRESTOR) 5 MG tablet Take 5 mg by mouth 3 (three) times a week. Mon, Weds & Fri      . Saxagliptin-Metformin 2.09-998 MG TB24 Take 1 tablet by mouth daily.      . temazepam (RESTORIL) 30 MG capsule Take 1 capsule (30 mg total)  by mouth at bedtime as needed for sleep.  30 capsule  0   No current facility-administered medications for this visit.    Past Medical History  Diagnosis Date  . Personal history of malignant neoplasm of breast   . Unspecified closed fracture of pelvis 2006  . Closed fracture of unspecified part of femur 2005  . Long term (current) use of anticoagulants   . Atrial fibrillation     Paroxysmal, infrequent, Coumadin  . Hyperlipidemia   . Osteoporosis     femur fracture 2005, pelvic fracture 2006  . Arthritis   . Hypertension   . Diabetes mellitus, type 2   . Diverticulosis   . Diverticulitis   . Syncope   . Chest pain     Nuclear, April, 2008, no ischemia,  . Ejection fraction     EF 60%, echo, February, 2008  //   EF 65-70%, echo, November, 2012  . Aortic valve sclerosis     Echo, 2008  . Bradycardia     October, 2012  . Cancer   . Chronic insomnia     situational stress    Past Surgical History  Procedure Laterality Date  .  Total abdominal hysterectomy w/ bilateral salpingoophorectomy  1995  . Shoulder surgery      left  . Mastectomy  1995    left  . Femur surgery  2005    ORIF  . Wrist surgery       x 2   . Tonsillectomy    . Eye surgery    . Cardioversion N/A 08/09/2012    Procedure: CARDIOVERSION;  Surgeon: Deboraha Sprang, MD;  Location: [place];  Service: Cardiovascular;  Laterality: N/A;  . Shoulder surgery  1979, 2003   ROS:  As stated in the HPI and negative for all other systems.  PHYSICAL EXAM BP 140/80  Pulse 80  Ht 5\' 5"  (1.651 m)  Wt 165 lb (74.844 kg)  BMI 27.46 kg/m2 GENERAL:  Well appearing NECK:  No jugular venous distention, waveform within normal limits, carotid upstroke brisk and symmetric, no bruits, no thyromegaly LUNGS:  Clear to auscultation bilaterally CHEST:  Unremarkable HEART:  PMI not displaced or sustained,S1 and S2 within normal limits, no S3, no clicks, no rubs, no murmurs, irregular ABD:  Flat, positive bowel sounds normal in frequency in pitch, no bruits, no rebound, no guarding, no midline pulsatile mass, no hepatomegaly, no splenomegaly EXT:  2 plus pulses throughout, no edema, no cyanosis no clubbing SKIN:  No rashes no nodules   ASSESSMENT AND PLAN  ATRIAL FIB:  Her heart rate is better controlled. The patient  tolerates this rhythm and rate control and anticoagulation. We will continue with the meds as listed.  HTN:  The blood pressure is at target. No change in medications is indicated. We will continue with therapeutic lifestyle changes (TLC).

## 2013-12-04 NOTE — Patient Instructions (Signed)
Your physician recommends that you schedule a follow-up appointment in: one year with Dr. Hochrein  

## 2013-12-24 ENCOUNTER — Ambulatory Visit: Payer: Medicare Other | Admitting: Internal Medicine

## 2014-02-25 ENCOUNTER — Telehealth: Payer: Self-pay

## 2014-02-25 NOTE — Telephone Encounter (Signed)
Patient called about a refill on xarelto . I called [facility] to[facility] see what was wrong with her refill it needed a PA which I did since I had then on the phones reference # [id number]. I called the patient back to let her know that the PA was done and she should get her med in 3-5 days if she sees that she is going to run out to come and pick up samples

## 2014-03-01 ENCOUNTER — Telehealth: Payer: Self-pay

## 2014-03-01 NOTE — Telephone Encounter (Signed)
Patient requesting samples for xarelto 15 mg, she waiting on her mail order. Placed a 3 weeks up front

## 2014-04-01 NOTE — Progress Notes (Signed)
Patient ID: Tara Bradley, female   DOB: [birth year], 80 y.o.   MRN: [id number] Put back samples on 03/29/14 that was placed on 03/01/14

## 2014-06-02 ENCOUNTER — Other Ambulatory Visit: Payer: Self-pay | Admitting: Cardiology

## 2014-06-23 ENCOUNTER — Other Ambulatory Visit: Payer: Self-pay | Admitting: Cardiology

## 2014-07-16 ENCOUNTER — Other Ambulatory Visit: Payer: Self-pay | Admitting: Cardiology

## 2014-08-12 ENCOUNTER — Other Ambulatory Visit: Payer: Self-pay | Admitting: Cardiology

## 2014-08-19 ENCOUNTER — Other Ambulatory Visit: Payer: Self-pay

## 2014-08-19 DIAGNOSIS — Z1231 Encounter for screening mammogram for malignant neoplasm of breast: Secondary | ICD-10-CM

## 2014-09-23 ENCOUNTER — Ambulatory Visit
Admission: RE | Admit: 2014-09-23 | Discharge: 2014-09-23 | Disposition: A | Discharge status: Home / Self Care | Payer: Medicare Other | Source: Ambulatory Visit

## 2014-09-23 DIAGNOSIS — Z1231 Encounter for screening mammogram for malignant neoplasm of breast: Secondary | ICD-10-CM

## 2014-11-15 ENCOUNTER — Other Ambulatory Visit: Payer: Self-pay | Admitting: Cardiology

## 2014-11-15 NOTE — Telephone Encounter (Signed)
Rx(s) sent to pharmacy electronically.  

## 2014-11-18 ENCOUNTER — Other Ambulatory Visit: Payer: Self-pay

## 2014-11-29 ENCOUNTER — Other Ambulatory Visit: Payer: Self-pay | Admitting: Cardiology

## 2014-11-29 NOTE — Telephone Encounter (Signed)
Rx has been sent to the pharmacy electronically. ° °

## 2014-12-30 ENCOUNTER — Encounter: Payer: Self-pay | Admitting: Cardiology

## 2014-12-30 ENCOUNTER — Ambulatory Visit (INDEPENDENT_AMBULATORY_CARE_PROVIDER_SITE_OTHER): Payer: Medicare Other | Admitting: Cardiology

## 2014-12-30 VITALS — BP 156/88 | HR 79 | Ht 65.0 in | Wt 145.0 lb

## 2014-12-30 DIAGNOSIS — I10 Essential (primary) hypertension: Secondary | ICD-10-CM

## 2014-12-30 NOTE — Progress Notes (Signed)
HPI The patient presents for followup of atrial fibrillation.  She's been tolerating this rhythm well.  Last year we did adjust her medications for better rate control. She's tolerating anticoagulation. She's lost about 20 pounds since I last saw her because she is exercising sometimes more than an hour a day. She's eating well. She doesn't notice her palpitations and doesn't have any cardiovascular symptoms. She denies any chest pressure, neck or arm discomfort. She has no shortness of breath, PND or orthopnea.  Allergies  Allergen Reactions  . Ambien [Zolpidem Tartrate]     Hallucinations   . Codeine Other (See Comments)    headache  . Codeine Sulfate     REACTION: unspecified    Current Outpatient Prescriptions  Medication Sig Dispense Refill  . Ascorbic Acid (VITAMIN C PO) Take 1 tablet by mouth daily.    . B Complex-C (B-COMPLEX WITH VITAMIN C) tablet Take 1 tablet by mouth daily.    . Calcium Carbonate-Vitamin D (CALTRATE 600+D) 600-400 MG-UNIT per tablet Take 1 tablet by mouth daily.      . Cholecalciferol (VITAMIN D) 2000 UNITS tablet Take 1,000 Units by mouth daily.     [name] DIGOX 125 MCG tablet Take 1 tablet by mouth  daily 90 tablet 0  . diltiazem (CARDIZEM CD) 300 MG 24 hr capsule Take 1 capsule by mouth  daily 90 capsule 2  . losartan-hydrochlorothiazide (HYZAAR) 100-12.5 MG per tablet Take 1 tablet by mouth daily. 90 tablet 3  . Magnesium 250 MG TABS Take 1 tablet by mouth daily.      . metoprolol tartrate (LOPRESSOR) 25 MG tablet Take 1 tablet by mouth two  times daily 180 tablet 0  . Saxagliptin-Metformin 2.09-998 MG TB24 Take 1 tablet by mouth daily.    Alveda Reasons 15 MG TABS tablet Take 1 tablet by mouth once a day with supper 90 tablet 0  . potassium chloride (KLOR-CON) 8 MEQ tablet Take 1 tablet (8 mEq total) by mouth daily. 90 tablet 3   No current facility-administered medications for this visit.    Past Medical History  Diagnosis Date  . Personal history of  malignant neoplasm of breast   . Unspecified closed fracture of pelvis 2006  . Closed fracture of unspecified part of femur 2005  . Long term (current) use of anticoagulants   . Atrial fibrillation     Paroxysmal, infrequent, Coumadin  . Hyperlipidemia   . Osteoporosis     femur fracture 2005, pelvic fracture 2006  . Arthritis   . Hypertension   . Diabetes mellitus, type 2   . Diverticulosis   . Diverticulitis   . Syncope   . Chest pain     Nuclear, April, 2008, no ischemia,  . Ejection fraction     EF 60%, echo, February, 2008  //   EF 65-70%, echo, November, 2012  . Aortic valve sclerosis     Echo, 2008  . Bradycardia     October, 2012  . Cancer   . Chronic insomnia     situational stress    Past Surgical History  Procedure Laterality Date  . Total abdominal hysterectomy w/ bilateral salpingoophorectomy  1995  . Shoulder surgery      left  . Mastectomy  1995    left  . Femur surgery  2005    ORIF  . Wrist surgery       x 2   . Tonsillectomy    . Eye surgery    .  Cardioversion N/A 08/09/2012    Procedure: CARDIOVERSION;  Surgeon: Deboraha Sprang, MD;  Location: [facility];  Service: Cardiovascular;  Laterality: N/A;  . Shoulder surgery  1979, 2003   ROS:  As stated in the HPI and negative for all other systems.  PHYSICAL EXAM BP 156/88 mmHg  Pulse 79  Ht 5\' 5"  (1.651 m)  Wt 145 lb (65.772 kg)  BMI 24.13 kg/m2 GENERAL:  Well appearing and looks younger than stated age NECK:  No jugular venous distention, waveform within normal limits, carotid upstroke brisk and symmetric, no bruits, no thyromegaly LUNGS:  Clear to auscultation bilaterally CHEST:  Unremarkable HEART:  PMI not displaced or sustained,S1 and S2 within normal limits, no S3, no clicks, no rubs, no murmurs, irregular ABD:  Flat, positive bowel sounds normal in frequency in pitch, no bruits, no rebound, no guarding, no midline pulsatile mass, no hepatomegaly, no splenomegaly EXT:  2 plus pulses  throughout, no edema, no cyanosis no clubbing SKIN:  No rashes no nodules  EKG:  Atrial fibrillation, rate 79,old anteroseptal infarct, nonspecific T-wave changes consistent with digoxin effect.  No significant change previous.  12/30/2014  ASSESSMENT AND PLAN  ATRIAL FIB:  Her heart rate is well controlled. The patient  tolerates this rhythm and rate control and anticoagulation. We will continue with the meds as listed. I have given her written instructions to get a CBC.  HTN:  The blood pressure is mildly elevated. However, this is unusual.. No change in medications is indicated. We will continue with therapeutic lifestyle changes (TLC).

## 2014-12-30 NOTE — Patient Instructions (Signed)
Your physician wants you to follow-up in: 1 Year. You will receive a reminder letter in the mail two months in advance. If you don't receive a letter, please call our office to schedule the follow-up appointment.  Please have PCP draw labs including a CBC

## 2015-01-16 ENCOUNTER — Other Ambulatory Visit: Payer: Self-pay | Admitting: Cardiology

## 2015-01-16 NOTE — Telephone Encounter (Signed)
REFILL 

## 2015-03-12 ENCOUNTER — Telehealth: Payer: Self-pay | Admitting: Cardiology

## 2015-03-12 NOTE — Telephone Encounter (Signed)
Pt says she need a prior authorization for her Xarelto. Please call [facility] -[phone].

## 2015-03-14 ENCOUNTER — Telehealth: Payer: Self-pay

## 2015-03-14 NOTE — Telephone Encounter (Signed)
Sent today

## 2015-03-14 NOTE — Telephone Encounter (Signed)
Prior auth for Xarelto 15mg  sent to [facility] via Cover [employer] Med[employer].

## 2015-03-17 ENCOUNTER — Telehealth: Payer: Self-pay

## 2015-03-17 NOTE — Telephone Encounter (Signed)
Duplicate encounter

## 2015-03-17 NOTE — Telephone Encounter (Signed)
Xarelto 15mg  approved by [facility]. [id number]. Good through 03/09/2016.

## 2015-03-31 ENCOUNTER — Other Ambulatory Visit (HOSPITAL_COMMUNITY): Payer: Self-pay | Admitting: Internal Medicine

## 2015-04-02 ENCOUNTER — Ambulatory Visit (HOSPITAL_COMMUNITY)
Admission: RE | Admit: 2015-04-02 | Discharge: 2015-04-02 | Disposition: A | Discharge status: Home / Self Care | Payer: Medicare Other | Source: Ambulatory Visit | Attending: Internal Medicine | Admitting: Internal Medicine

## 2015-04-02 ENCOUNTER — Encounter (HOSPITAL_COMMUNITY): Payer: Self-pay

## 2015-04-02 DIAGNOSIS — M810 Age-related osteoporosis without current pathological fracture: Secondary | ICD-10-CM | POA: Insufficient documentation

## 2015-04-02 MED ORDER — SODIUM CHLORIDE 0.9 % IV SOLN
INTRAVENOUS | Status: AC
  Administered 2015-04-02: 250 mL via INTRAVENOUS

## 2015-04-02 MED ORDER — ZOLEDRONIC ACID 5 MG/100ML IV SOLN
5.0000 mg | Freq: Once | INTRAVENOUS | Status: DC
  Filled 2015-04-02: qty 100

## 2015-04-02 NOTE — Discharge Instructions (Signed)
Drink  Fluids/water as tolerated over the next 72 hours Tylenol or ibuprofen OTC as directed Continue Calcium and Vit D as directed by your MD   RECLAST Zoledronic Acid injection (Paget's Disease, Osteoporosis) What is this medicine? ZOLEDRONIC ACID (ZOE le dron ik AS id) lowers the amount of calcium loss from bone. It is used to treat Paget's disease and osteoporosis in women. This medicine may be used for other purposes; ask your health care provider or pharmacist if you have questions. What should I tell my health care provider before I take this medicine? They need to know if you have any of these conditions: -aspirin-sensitive asthma -cancer, especially if you are receiving medicines used to treat cancer -dental disease or wear dentures -infection -kidney disease -low levels of calcium in the blood -past surgery on the parathyroid gland or intestines -receiving corticosteroids like dexamethasone or prednisone -an unusual or allergic reaction to zoledronic acid, other medicines, foods, dyes, or preservatives -pregnant or trying to get pregnant -breast-feeding How should I use this medicine? This medicine is for infusion into a vein. It is given by a health care professional in a hospital or clinic setting. Talk to your pediatrician regarding the use of this medicine in children. This medicine is not approved for use in children. Overdosage: If you think you have taken too much of this medicine contact a poison control center or emergency room at once. NOTE: This medicine is only for you. Do not share this medicine with others. What if I miss a dose? It is important not to miss your dose. Call your doctor or health care professional if you are unable to keep an appointment. What may interact with this medicine? -certain antibiotics given by injection -NSAIDs, medicines for pain and inflammation, like ibuprofen or naproxen -some diuretics like bumetanide,  furosemide -teriparatide This list may not describe all possible interactions. Give your health care provider a list of all the medicines, herbs, non-prescription drugs, or dietary supplements you use. Also tell them if you smoke, drink alcohol, or use illegal drugs. Some items may interact with your medicine. What should I watch for while using this medicine? Visit your doctor or health care professional for regular checkups. It may be some time before you see the benefit from this medicine. Do not stop taking your medicine unless your doctor tells you to. Your doctor may order blood tests or other tests to see how you are doing. Women should inform their doctor if they wish to become pregnant or think they might be pregnant. There is a potential for serious side effects to an unborn child. Talk to your health care professional or pharmacist for more information. You should make sure that you get enough calcium and vitamin D while you are taking this medicine. Discuss the foods you eat and the vitamins you take with your health care professional. Some people who take this medicine have severe bone, joint, and/or muscle pain. This medicine may also increase your risk for jaw problems or a broken thigh bone. Tell your doctor right away if you have severe pain in your jaw, bones, joints, or muscles. Tell your doctor if you have any pain that does not go away or that gets worse. Tell your dentist and dental surgeon that you are taking this medicine. You should not have major dental surgery while on this medicine. See your dentist to have a dental exam and fix any dental problems before starting this medicine. Take good care of your teeth  while on this medicine. Make sure you see your dentist for regular follow-up appointments. What side effects may I notice from receiving this medicine? Side effects that you should report to your doctor or health care professional as soon as possible: -allergic reactions  like skin rash, itching or hives, swelling of the face, lips, or tongue -anxiety, confusion, or depression -breathing problems -changes in vision -eye pain -feeling faint or lightheaded, falls -jaw pain, especially after dental work -mouth sores -muscle cramps, stiffness, or weakness -redness, blistering, peeling or loosening of the skin, including inside the mouth -trouble passing urine or change in the amount of urine Side effects that usually do not require medical attention (report to your doctor or health care professional if they continue or are bothersome): -bone, joint, or muscle pain -constipation -diarrhea -fever -hair loss -irritation at site where injected -loss of appetite -nausea, vomiting -stomach upset -trouble sleeping -trouble swallowing -weak or tired This list may not describe all possible side effects. Call your doctor for medical advice about side effects. You may report side effects to FDA at 1-800-FDA-1088. Where should I keep my medicine? This drug is given in a hospital or clinic and will not be stored at home. NOTE: This sheet is a summary. It may not cover all possible information. If you have questions about this medicine, talk to your doctor, pharmacist, or health care provider.    2016, Elsevier/Gold Standard. (2013-10-06 14:19:57) Osteoporosis Osteoporosis is the thinning and loss of density in the bones. Osteoporosis makes the bones more brittle, fragile, and likely to break (fracture). Over time, osteoporosis can cause the bones to become so weak that they fracture after a simple fall. The bones most likely to fracture are the bones in the hip, wrist, and spine. CAUSES  The exact cause is not known. RISK FACTORS Anyone can develop osteoporosis. You may be at greater risk if you have a family history of the condition or have poor nutrition. You may also have a higher risk if you are:   Female.   36 years old or older.  A smoker.  Not  physically active.   White or Asian.  Slender. SIGNS AND SYMPTOMS  A fracture might be the first sign of the disease, especially if it results from a fall or injury that would not usually cause a bone to break. Other signs and symptoms include:   Low back and neck pain.  Stooped posture.  Height loss. DIAGNOSIS  To make a diagnosis, your health care provider may:  Take a medical history.  Perform a physical exam.  Order tests, such as:  A bone mineral density test.  A dual-energy X-ray absorptiometry test. TREATMENT  The goal of osteoporosis treatment is to strengthen your bones to reduce your risk of a fracture. Treatment may involve:  Making lifestyle changes, such as:  Eating a diet rich in calcium.  Doing weight-bearing and muscle-strengthening exercises.  Stopping tobacco use.  Limiting alcohol intake.  Taking medicine to slow the process of bone loss or to increase bone density.  Monitoring your levels of calcium and vitamin D. HOME CARE INSTRUCTIONS  Include calcium and vitamin D in your diet. Calcium is important for bone health, and vitamin D helps the body absorb calcium.  Perform weight-bearing and muscle-strengthening exercises as directed by your health care provider.  Do not use any tobacco products, including cigarettes, chewing tobacco, and electronic cigarettes. If you need help quitting, ask your health care provider.  Limit your alcohol intake.  Take medicines only as directed by your health care provider.  Keep all follow-up visits as directed by your health care provider. This is important.  Take precautions at home to lower your risk of falling, such as:  Keeping rooms well lit and clutter free.  Installing safety rails on stairs.  Using rubber mats in the bathroom and other areas that are often wet or slippery. SEEK IMMEDIATE MEDICAL CARE IF:  You fall or injure yourself.    This information is not intended to replace advice  given to you by your health care provider. Make sure you discuss any questions you have with your health care provider.   Document Released: 02/17/2005 Document Revised: 05/31/2014 Document Reviewed: 10/18/2013 Elsevier Interactive Patient Education [employer].

## 2015-04-02 NOTE — Progress Notes (Signed)
Uneventful 1st infusion of RECLAST at [facility]. Pt states " i have had several infusions, maybe 4 and done well with them" pt was discharged unaccompanied to elevator to go to car.

## 2015-04-16 ENCOUNTER — Ambulatory Visit (INDEPENDENT_AMBULATORY_CARE_PROVIDER_SITE_OTHER): Payer: Medicare Other | Admitting: Podiatry

## 2015-04-16 ENCOUNTER — Encounter: Payer: Self-pay | Admitting: Podiatry

## 2015-04-16 VITALS — BP 120/72 | HR 77 | Resp 12

## 2015-04-16 DIAGNOSIS — B351 Tinea unguium: Secondary | ICD-10-CM | POA: Diagnosis not present

## 2015-04-16 DIAGNOSIS — M79674 Pain in right toe(s): Secondary | ICD-10-CM

## 2015-04-16 DIAGNOSIS — M2042 Other hammer toe(s) (acquired), left foot: Secondary | ICD-10-CM | POA: Diagnosis not present

## 2015-04-16 DIAGNOSIS — M2041 Other hammer toe(s) (acquired), right foot: Secondary | ICD-10-CM | POA: Diagnosis not present

## 2015-04-16 DIAGNOSIS — M79675 Pain in left toe(s): Secondary | ICD-10-CM | POA: Diagnosis not present

## 2015-04-16 DIAGNOSIS — E1142 Type 2 diabetes mellitus with diabetic polyneuropathy: Secondary | ICD-10-CM | POA: Diagnosis not present

## 2015-04-16 NOTE — Patient Instructions (Signed)
A your diabetic foot screen demonstrated mild decrease in feeling in your right and left feet associate with diabetic peripheral neuropathy Hammertoes and right and left feet Will obtain certification for diabetic shoes and notify you for measuring when certification is obtained  Diabetes and Foot Care Diabetes may cause you to have problems because of poor blood supply (circulation) to your feet and legs. This may cause the skin on your feet to become thinner, break easier, and heal more slowly. Your skin may become dry, and the skin may peel and crack. You may also have nerve damage in your legs and feet causing decreased feeling in them. You may not notice minor injuries to your feet that could lead to infections or more serious problems. Taking care of your feet is one of the most important things you can do for yourself.  HOME CARE INSTRUCTIONS  Wear shoes at all times, even in the house. Do not go barefoot. Bare feet are easily injured.  Check your feet daily for blisters, cuts, and redness. If you cannot see the bottom of your feet, use a mirror or ask someone for help.  Wash your feet with warm water (do not use hot water) and mild soap. Then pat your feet and the areas between your toes until they are completely dry. Do not soak your feet as this can dry your skin.  Apply a moisturizing lotion or petroleum jelly (that does not contain alcohol and is unscented) to the skin on your feet and to dry, brittle toenails. Do not apply lotion between your toes.  Trim your toenails straight across. Do not dig under them or around the cuticle. File the edges of your nails with an emery board or nail file.  Do not cut corns or calluses or try to remove them with medicine.  Wear clean socks or stockings every day. Make sure they are not too tight. Do not wear knee-high stockings since they may decrease blood flow to your legs.  Wear shoes that fit properly and have enough cushioning. To break in  new shoes, wear them for just a few hours a day. This prevents you from injuring your feet. Always look in your shoes before you put them on to be sure there are no objects inside.  Do not cross your legs. This may decrease the blood flow to your feet.  If you find a minor scrape, cut, or break in the skin on your feet, keep it and the skin around it clean and dry. These areas may be cleansed with mild soap and water. Do not cleanse the area with peroxide, alcohol, or iodine.  When you remove an adhesive bandage, be sure not to damage the skin around it.  If you have a wound, look at it several times a day to make sure it is healing.  Do not use heating pads or hot water bottles. They may burn your skin. If you have lost feeling in your feet or legs, you may not know it is happening until it is too late.  Make sure your health care provider performs a complete foot exam at least annually or more often if you have foot problems. Report any cuts, sores, or bruises to your health care provider immediately. SEEK MEDICAL CARE IF:   You have an injury that is not healing.  You have cuts or breaks in the skin.  You have an ingrown nail.  You notice redness on your legs or feet.  You feel  burning or tingling in your legs or feet.  You have pain or cramps in your legs and feet.  Your legs or feet are numb.  Your feet always feel cold. SEEK IMMEDIATE MEDICAL CARE IF:   There is increasing redness, swelling, or pain in or around a wound.  There is a red line that goes up your leg.  Pus is coming from a wound.  You develop a fever or as directed by your health care provider.  You notice a bad smell coming from an ulcer or wound.   This information is not intended to replace advice given to you by your health care provider. Make sure you discuss any questions you have with your health care provider.   Document Released: 05/07/2000 Document Revised: 01/10/2013 Document Reviewed:  10/17/2012 Elsevier Interactive Patient Education [employer].

## 2015-04-16 NOTE — Progress Notes (Signed)
   Subjective:    Patient ID: Tara Bradley, female    DOB: September 03, 1922, 81 y.o.   MRN: [id number]  HPI   This patient presents today complaining of painful toenails and walking wearing shoes gradually increasing over the last several years. Patient has attempted to trim toenails herself, however unable to do so because of deformity and a history of diabetes. She denies any recent podiatric care. She is requesting debridement of the painful toenails. Also, patient is complaining of painful hammertoes on the right and left feet making it difficult for her to find a standard shoe and she is requesting diabetic shoes.  Patient denies any history of foot ulceration, claudication or amputation  Review of Systems  Gastrointestinal: Positive for abdominal pain and diarrhea.  Musculoskeletal: Positive for joint swelling and gait problem.       Objective:   Physical Exam  Orientated 3  Vascular: No peripheral edema bilaterally Varicosities dorsal ankle and feet bilaterally DP and PT pulses 2/4 bilaterally Capillary reflex immediate bilaterally  Dermatological: The toenails are elongated, incurvated, discolored, hypertrophic and tender to direct palpation 6-10 No open skin lesions bilaterally  No open skin lesions bilaterally  Neurological: Sensation to 10 g monofilament wire intact 4/5 bilaterally Vibratory sensation nonreactive bilaterally Ankle reflex equal and reactive bilaterally  Musculoskeletal: Hammertoe deformities 2-4 bilaterally There is no restriction ankle, subtalar, midtarsal joints bilaterally      Assessment & Plan:   Assessment: Satisfactory vascular status Diabetic peripheral neuropathy associated with decrease sensation to 10 g monofilament wire and vibratory sensation Symptomatic onychomycoses 6-10  Plan: I reviewed the results of examination with patient today. The toenails 6-10 were debrided mechanically electrically without a bleeding  Obtain  certification for diabetic shoes for the indication of: Type II diabetic Hammertoe deformities bilaterally Diabetic peripheral neuropathy  Notify patient upon receipt of certification for diabetic shoes from patient's physician Dr. Buddy Duty

## 2015-06-04 ENCOUNTER — Encounter: Payer: Self-pay | Admitting: Physical Therapy

## 2015-06-04 ENCOUNTER — Ambulatory Visit: Payer: Medicare HMO | Attending: Family Medicine | Admitting: Physical Therapy

## 2015-06-04 DIAGNOSIS — R29898 Other symptoms and signs involving the musculoskeletal system: Secondary | ICD-10-CM | POA: Insufficient documentation

## 2015-06-04 DIAGNOSIS — M25611 Stiffness of right shoulder, not elsewhere classified: Secondary | ICD-10-CM | POA: Diagnosis present

## 2015-06-04 NOTE — Patient Instructions (Signed)
   Stand facing a bare wall, with wash cloths or small towels in your hands. Slide your shoulder up, stressing the end range a little bit, and back down again. Repeat, teasing your shoulder flexion each time 10 times 2 times per day.    Stand to the side with your affected shoulder near a wall. Slide your arm up to the side, teasing the end range of movement. Slowly come back down and repeat    Raise your shoulders upward towards your ears as shown. Shrug both shoulders at the same time. 10 times 2 times per day. Earlie Counts, PT @TODAY @ 12:14 PM  [facility] [address] [place], [place] [address] Phone # [phone] Fax [phone]

## 2015-06-04 NOTE — Therapy (Signed)
[facility] [address], [place], [place], [address] Phone: [phone]   Fax:  [phone]  Physical Therapy Evaluation  Patient Details  Name: Tara Bradley MRN: [id number] Date of Birth: [birth year] Referring Provider: Dr. [name]  Encounter Date: 06/04/2015      PT End of Session - 06/04/15 1216    Visit Number 1   Date for PT Re-Evaluation 07/30/15   PT Start Time 1150   PT Stop Time 1217   PT Time Calculation (min) 27 min   Activity Tolerance Patient tolerated treatment well   Behavior During Therapy [facility] for tasks assessed/performed      Past Medical History  Diagnosis Date  . Personal history of malignant neoplasm of breast   . Unspecified closed fracture of pelvis 2006  . Closed fracture of unspecified part of femur 2005  . Long term (current) use of anticoagulants   . Atrial fibrillation (HCC)     Paroxysmal, infrequent, Coumadin  . Hyperlipidemia   . Osteoporosis     femur fracture 2005, pelvic fracture 2006  . Arthritis   . Hypertension   . Diabetes mellitus, type 2 ([place])   . Diverticulosis   . Diverticulitis   . Syncope   . Chest pain     Nuclear, April, 2008, no ischemia,  . Ejection fraction     EF 60%, echo, February, 2008  //   EF 65-70%, echo, November, 2012  . Aortic valve sclerosis     Echo, 2008  . Bradycardia     October, 2012  . Cancer ([place])   . Chronic insomnia     situational stress    Past Surgical History  Procedure Laterality Date  . Total abdominal hysterectomy w/ bilateral salpingoophorectomy  1995  . Shoulder surgery      left  . Mastectomy  1995    left  . Femur surgery  2005    ORIF  . Wrist surgery       x 2   . Tonsillectomy    . Eye surgery    . Cardioversion N/A 08/09/2012    Procedure: CARDIOVERSION;  Surgeon: Deboraha Sprang, MD;  Location: [place];  Service: Cardiovascular;  Laterality: N/A;  . Shoulder surgery  1979, 2003    There  were no vitals filed for this visit.  Visit Diagnosis:  Shoulder weakness - Plan: PT plan of care cert/re-cert  Shoulder stiffness, right - Plan: PT plan of care cert/re-cert      Subjective Assessment - 06/04/15 1155    Subjective Patient reports she fell on 05/03/2015 when she tripped on something.  Patient fell into the wall and hurt her left shoulder.  Patient fractured 1 rib. Patient had surgery on left shoulder due to it slipping out of joint.    Patient Stated Goals Reduce pain   Currently in Pain? Yes   Pain Score 5    Pain Location Arm   Pain Orientation Left   Pain Descriptors / Indicators Aching   Pain Type Acute pain   Pain Onset More than a month ago   Pain Frequency Intermittent   Aggravating Factors  raising her left arm, picking up items   Pain Relieving Factors rest   Multiple Pain Sites No            [facility] PT Assessment - 06/04/15 0001    Assessment   Medical Diagnosis S46.912A Left shoulder Pain   Referring Provider Dr. [name]  Onset Date/Surgical Date 05/03/15   Hand Dominance Right   Prior Therapy none   Precautions   Precautions Other (comment)   Precaution Comments cancer and osteoporosis precautions  left breast cancer   Balance Screen   Has the patient fallen in the past 6 months Yes  patient does not want her balance assesseed   How many times? 1  tripped on something   Has the patient had a decrease in activity level because of a fear of falling?  No   Is the patient reluctant to leave their home because of a fear of falling?  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 for tasks assessed   Observation/Other Assessments   Focus on Therapeutic Outcomes (FOTO)  50% limitation CK  goal 34% limitation CJ   ROM / Strength   AROM / PROM / Strength AROM;PROM;Strength   AROM   Left Shoulder Flexion 120 Degrees   Left Shoulder ABduction  120 Degrees   PROM   Left Shoulder Flexion 130 Degrees   Left Shoulder ABduction 110 Degrees   Left Shoulder Internal Rotation 50 Degrees   Left Shoulder External Rotation 60 Degrees   Strength   Left Shoulder Flexion 3+/5   Left Shoulder ABduction 3+/5   Left Shoulder Internal Rotation 4+/5   Left Shoulder External Rotation 4/5   Palpation   Palpation comment palpable tenderness located in posterior axilla and left tricep                           PT Education - 06/04/15 1216    Education provided Yes   Education Details shoulder ROM exercises   Person(s) Educated Patient   Methods Explanation;Demonstration;Verbal cues;Handout   Comprehension Returned demonstration;Verbalized understanding          PT Short Term Goals - 06/04/15 1222    PT SHORT TERM GOAL #1   Title independent with initial HEP   Time 4   Period Weeks   Status New   PT SHORT TERM GOAL #2   Title pain with overhead movements decreased >/= 25%   Time 4   Period Weeks   Status New   PT SHORT TERM GOAL #3   Title pain putting a coat on decreased >/= 25%   Time 4   Period Weeks   Status New           PT Long Term Goals - 06/04/15 1223    PT LONG TERM GOAL #1   Title independent with HEP and unstands how to progress herself   Time 8   Period Weeks   Status New   PT LONG TERM GOAL #2   Title pain with putting a coat on decreased >/= 75%   Time 8   Period Weeks   Status New   PT LONG TERM GOAL #3   Title left shoulder strength >/= 4+5 so she can return to her prior gym program   Time 8   Period Weeks   Status New   PT LONG TERM GOAL #4   Title pain with fixing her hair decreased >/= 75%   Time 8   Period Weeks   Status New   PT LONG TERM GOAL #5   Title FOTO score </= 34% limitation   Time 8   Period Weeks   Status New  Plan - Jun 10, 2015 1217    Clinical Impression Statement Patient is a 82 year old female with diagnosis of left shoulder pain  from a fall on 05/03/2015.  Patient reports she does not want her balance assessed at this time. FOTO score is 50% limitaiton.  Patient reports her pain level is 5/10 located in the tricep area intermittently.   Pain is worse with raising her arm up, putting a coat on or going to the gym.  lef tshoulder AROM/PROM in degrees: flexion 120/130, abduction 120/110, internal rotation NT/50, and external rotation NT/60.  Left shoulder strength: flexion 3+/5, abduction 3+/5, internal rotation 4+/5, and external rotation 4/5.  Palpable tenderness located in left posterior axilla and left tricep. Patient would benefit from physical therapy to reduce pain and increase ROM and strength.    Pt will benefit from skilled therapeutic intervention in order to improve on the following deficits Pain;Decreased strength;Decreased mobility   Rehab Potential Excellent   Clinical Impairments Affecting Rehab Potential History of left breast cancer so no UBE, ultrasound, heat or ice; Osteoporosis so no joint mobilizatioin   PT Frequency 2x / week   PT Duration 8 weeks   PT Treatment/Interventions Electrical Stimulation;Therapeutic exercise;Therapeutic activities;Neuromuscular re-education;Patient/family education;Manual techniques;Passive range of motion   PT Next Visit Plan soft tissue work, ROM to left shoulder, scapular strengthening   PT Home Exercise Plan shoulder rOM exercises   Recommended Other Services None   Consulted and Agree with Plan of Care Patient          G-Codes - June 10, 2015 1225    Functional Assessment Tool Used FOTO score is 50% limitation  goal 34% limitation CJ   Functional Limitation Other PT primary   Other PT Primary Current Status [id number]) At least 40 percent but less than 60 percent impaired, limited or restricted   Other PT Primary Goal Status [id number]) At least 20 percent but less than 40 percent impaired, limited or restricted       Problem List Patient Active Problem List   Diagnosis  Date Noted  . Chronic anticoagulation   . Ejection fraction   . History of breast cancer in female 09/21/2011  . Bradycardia   . Aortic valve sclerosis   . Syncope   . Diverticulosis of colon 08/20/2010  . ANXIETY STATE, UNSPECIFIED 06/15/2010  . SCOLIOSIS, LUMBAR SPINE 06/15/2010  . ADENOCARCINOMA, BREAST, HX OF 12/17/2008  . SYNCOPE, HX OF 12/17/2008  . MASTECTOMY, LEFT, HX OF 12/17/2008  . HYPERLIPIDEMIA, WITH HIGH HDL 07/15/2008  . URI 06/02/2007  . OSTEOARTHRITIS 05/08/2007  . DIABETES MELLITUS, TYPE II 02/01/2007  . Essential hypertension 02/01/2007  . OSTEOPOROSIS 02/01/2007  . Atrial fibrillation ([place]) 01/09/2007    Earlie Counts, PT 10-Jun-2015 12:29 PM   [facility] [address] [place], [place], [address] Phone: [phone]   Fax:  [phone]  Name: Tara Bradley MRN: [id number] Date of Birth: 1923/01/30

## 2015-06-05 ENCOUNTER — Other Ambulatory Visit: Payer: Self-pay | Admitting: Cardiology

## 2015-06-05 MED ORDER — DILTIAZEM HCL ER COATED BEADS 300 MG PO CP24: 300.0000 mg | ORAL_CAPSULE | Freq: Every day | ORAL | Status: DC

## 2015-06-09 ENCOUNTER — Ambulatory Visit: Payer: Medicare HMO | Admitting: Physical Therapy

## 2015-06-09 ENCOUNTER — Encounter: Payer: Self-pay | Admitting: Physical Therapy

## 2015-06-09 DIAGNOSIS — R29898 Other symptoms and signs involving the musculoskeletal system: Secondary | ICD-10-CM

## 2015-06-09 DIAGNOSIS — M25611 Stiffness of right shoulder, not elsewhere classified: Secondary | ICD-10-CM

## 2015-06-09 NOTE — Therapy (Signed)
[facility] [address], [place], [place], [address] Phone: [phone]   Fax:  [phone]  Physical Therapy Treatment  Patient Details  Name: Tara Bradley MRN: [id number] Date of Birth: 05-31-1922 Referring Provider: Dr. [name]  Encounter Date: 06/09/2015      PT End of Session - 06/09/15 1252    Visit Number 2   Number of Visits 10   Date for PT Re-Evaluation 07/30/15   PT Start Time 1236   PT Stop Time 1318   PT Time Calculation (min) 42 min   Activity Tolerance Patient tolerated treatment well   Behavior During Therapy [facility] for tasks assessed/performed      Past Medical History  Diagnosis Date  . Personal history of malignant neoplasm of breast   . Unspecified closed fracture of pelvis 2006  . Closed fracture of unspecified part of femur 2005  . Long term (current) use of anticoagulants   . Atrial fibrillation (HCC)     Paroxysmal, infrequent, Coumadin  . Hyperlipidemia   . Osteoporosis     femur fracture 2005, pelvic fracture 2006  . Arthritis   . Hypertension   . Diabetes mellitus, type 2 ([place])   . Diverticulosis   . Diverticulitis   . Syncope   . Chest pain     Nuclear, April, 2008, no ischemia,  . Ejection fraction     EF 60%, echo, February, 2008  //   EF 65-70%, echo, November, 2012  . Aortic valve sclerosis     Echo, 2008  . Bradycardia     October, 2012  . Cancer ([place])   . Chronic insomnia     situational stress    Past Surgical History  Procedure Laterality Date  . Total abdominal hysterectomy w/ bilateral salpingoophorectomy  1995  . Shoulder surgery      left  . Mastectomy  1995    left  . Femur surgery  2005    ORIF  . Wrist surgery       x 2   . Tonsillectomy    . Eye surgery    . Cardioversion N/A 08/09/2012    Procedure: CARDIOVERSION;  Surgeon: Deboraha Sprang, MD;  Location: [place];  Service: Cardiovascular;  Laterality: N/A;  . Shoulder surgery   1979, 2003    There were no vitals filed for this visit.  Visit Diagnosis:  Shoulder weakness  Shoulder stiffness, right      Subjective Assessment - 06/09/15 1244    Subjective Patient reports now able to reach into cabinetts overhead. no complain of pain   Pertinent History Patient felt on 05/03/2015 on left shoulder. Many years ago had subluxation left shoulder and surgery    Patient Stated Goals Reduce pain   Currently in Pain? No/denies                         [facility] Adult PT Treatment/Exercise - 06/09/15 0001    Exercises   Exercises Shoulder   Shoulder Exercises: Seated   Elevation AROM;Both;10 reps  added 2# for second set   Flexion Strengthening;Both;20 reps;Weights  2# added   Abduction Strengthening;Both;20 reps;Weights  2#added   Shoulder Exercises: Sidelying   Flexion Left;20 reps;Weights  1#   ABduction Left;20 reps  1#   Shoulder Exercises: Standing   Flexion AAROM  x 10   ABduction AAROM  x 10   Other Standing Exercises conestacking 1.5# x 79min  to  first shelf   Shoulder Exercises: Pulleys   Flexion 2 minutes   Shoulder Exercises: ROM/Strengthening   Rebounder no UBE   Other ROM/Strengthening Exercises [place] with red t-band 2 x10                  PT Short Term Goals - 06/09/15 1301    PT SHORT TERM GOAL #1   Title independent with initial HEP   Time 4   Period Weeks   Status On-going   PT SHORT TERM GOAL #2   Title pain with overhead movements decreased >/= 25%   Time 4   Period Weeks   Status On-going   PT SHORT TERM GOAL #3   Title pain putting a coat on decreased >/= 25%   Time 4   Period Weeks   Status Achieved           PT Long Term Goals - 06/09/15 1304    PT LONG TERM GOAL #1   Title independent with HEP and unstands how to progress herself   Time 8   Period Weeks   Status On-going   PT LONG TERM GOAL #2   Title pain with putting a coat on decreased >/= 75%   Time 8   Period Weeks    Status Achieved   PT LONG TERM GOAL #3   Title left shoulder strength >/= 4+5 so she can return to her prior gym program   Time 8   Period Weeks   Status On-going   PT LONG TERM GOAL #4   Title pain with fixing her hair decreased >/= 75%   Time 8   Period Weeks   Status On-going   PT LONG TERM GOAL #5   Title FOTO score </= 34% limitation   Time 8   Period Weeks   Status On-going               Plan - 06/09/15 1253    Clinical Impression Statement Patient is a 82 year old female with diagnosis of left shoulder weakness due to fall on 05/03/2015. Patient reports no more pain with putting on a coat. Pt will     Pt will benefit from skilled therapeutic intervention in order to improve on the following deficits Pain;Decreased strength;Decreased mobility   Rehab Potential Excellent   Clinical Impairments Affecting Rehab Potential History of left breast cancer so no UBE, ultrasound, heat or ice; Osteoporosis so no joint mobilizatioin   PT Frequency 2x / week   PT Duration 8 weeks   PT Treatment/Interventions Electrical Stimulation;Therapeutic exercise;Therapeutic activities;Neuromuscular re-education;Patient/family education;Manual techniques;Passive range of motion   PT Next Visit Plan soft tissue work, ROM to left shoulder, scapular strengthening   PT Home Exercise Plan shoulder strength, ROM exercises   Consulted and Agree with Plan of Care Patient        Problem List Patient Active Problem List   Diagnosis Date Noted  . Chronic anticoagulation   . Ejection fraction   . History of breast cancer in female 09/21/2011  . Bradycardia   . Aortic valve sclerosis   . Syncope   . Diverticulosis of colon 08/20/2010  . ANXIETY STATE, UNSPECIFIED 06/15/2010  . SCOLIOSIS, LUMBAR SPINE 06/15/2010  . ADENOCARCINOMA, BREAST, HX OF 12/17/2008  . SYNCOPE, HX OF 12/17/2008  . MASTECTOMY, LEFT, HX OF 12/17/2008  . HYPERLIPIDEMIA, WITH HIGH HDL 07/15/2008  . URI 06/02/2007  .  OSTEOARTHRITIS 05/08/2007  . DIABETES MELLITUS, TYPE II 02/01/2007  . Essential hypertension  02/01/2007  . OSTEOPOROSIS 02/01/2007  . Atrial fibrillation ([place]) 01/09/2007    NAUMANN-HOUEGNIFIO,Izaan Kingbird PTA 06/09/2015, 1:22 PM  [facility] [address] [place], [place], [address] Phone: [phone]   Fax:  [phone]  Name: GALA MERRIHEW MRN: [id number] Date of Birth: 25-Dec-1922

## 2015-06-11 ENCOUNTER — Ambulatory Visit: Payer: Medicare HMO | Admitting: *Deleted

## 2015-06-11 DIAGNOSIS — E1142 Type 2 diabetes mellitus with diabetic polyneuropathy: Secondary | ICD-10-CM

## 2015-06-11 NOTE — Progress Notes (Signed)
Patient ID: Tara Bradley, female   DOB: 01/13/1923, 82 y.o.   MRN: [id number] Patient presents to be scanned and measured for diabetic shoes and inserts.

## 2015-06-12 ENCOUNTER — Encounter: Payer: Self-pay | Admitting: Physical Therapy

## 2015-06-12 ENCOUNTER — Ambulatory Visit: Payer: Medicare HMO | Admitting: Physical Therapy

## 2015-06-12 DIAGNOSIS — M25611 Stiffness of right shoulder, not elsewhere classified: Secondary | ICD-10-CM

## 2015-06-12 DIAGNOSIS — R29898 Other symptoms and signs involving the musculoskeletal system: Secondary | ICD-10-CM | POA: Diagnosis not present

## 2015-06-12 NOTE — Patient Instructions (Addendum)
Strengthening: Resisted Internal Rotation   Hold tubing in left hand, elbow at side and forearm out. Rotate forearm in across body.  Perform all exercises x 10 with red t-band and elbows bend Repeat ____ times per set. Do ____ sets per session. Do ____ sessions per day.  http://orth.exer.us/830   Copyright  VHI. All rights reserved.  Strengthening: Resisted External Rotation   Hold tubing in right hand, elbow at side and forearm across body. Rotate forearm out. Repeat ____ times per set. Do ____ sets per session. Do ____ sessions per day.  http://orth.exer.us/828   Copyright  VHI. All rights reserved.  Strengthening: Resisted Flexion   Hold tubing with left arm at side. Pull forward and up. Move shoulder through pain-free range of motion. Repeat ____ times per set. Do ____ sets per session. Do ____ sessions per day.  http://orth.exer.us/824   Copyright  VHI. All rights reserved.  Strengthening: Resisted Extension   Hold tubing in right hand, arm forward. Pull arm back, elbow straight. Repeat ____ times per set. Do ____ sets per session. Do ____ sessions per day.  http://orth.exer.us/832   Copyright  VHI. All rights reserved.

## 2015-06-12 NOTE — Therapy (Signed)
[facility] [address] [place], [place], [address] Phone: [phone]   Fax:  [phone]  Physical Therapy Treatment  Patient Details  Name: Tara Bradley MRN: [id number] Date of Birth: [birth year] Referring Provider: Dr. [name]  Encounter Date: 06/12/2015      PT End of Session - 06/12/15 1125    Visit Number 3   Number of Visits 10   Date for PT Re-Evaluation 07/30/15   PT Start Time 1059   PT Stop Time 1140   PT Time Calculation (min) 41 min   Activity Tolerance Patient tolerated treatment well   Behavior During Therapy [name] for tasks assessed/performed      Past Medical History  Diagnosis Date  . Personal history of malignant neoplasm of breast   . Unspecified closed fracture of pelvis 2006  . Closed fracture of unspecified part of femur 2005  . Long term (current) use of anticoagulants   . Atrial fibrillation (HCC)     Paroxysmal, infrequent, Coumadin  . Hyperlipidemia   . Osteoporosis     femur fracture 2005, pelvic fracture 2006  . Arthritis   . Hypertension   . Diabetes mellitus, type 2 ([place])   . Diverticulosis   . Diverticulitis   . Syncope   . Chest pain     Nuclear, April, 2008, no ischemia,  . Ejection fraction     EF 60%, echo, February, 2008  //   EF 65-70%, echo, November, 2012  . Aortic valve sclerosis     Echo, 2008  . Bradycardia     October, 2012  . Cancer ([place])   . Chronic insomnia     situational stress    Past Surgical History  Procedure Laterality Date  . Total abdominal hysterectomy w/ bilateral salpingoophorectomy  1995  . Shoulder surgery      left  . Mastectomy  1995    left  . Femur surgery  2005    ORIF  . Wrist surgery       x 2   . Tonsillectomy    . Eye surgery    . Cardioversion N/A 08/09/2012    Procedure: CARDIOVERSION;  Surgeon: Deboraha Sprang, MD;  Location: [facility];  Service: Cardiovascular;  Laterality: N/A;  . Shoulder surgery   1979, 2003    There were no vitals filed for this visit.  Visit Diagnosis:  Shoulder weakness  Shoulder stiffness, right      Subjective Assessment - 06/12/15 1105    Subjective Patient reports no complain of pain, but notices left arm is weaker   Pertinent History Patient felt on 05/03/2015 on left shoulder. Many years ago had subluxation left shoulder and surgery    Patient Stated Goals Reduce pain   Currently in Pain? No/denies   Multiple Pain Sites No                         [facility] Adult PT Treatment/Exercise - 06/12/15 0001    Exercises   Exercises Shoulder   Shoulder Exercises: Seated   Elevation AROM;Both;10 reps  2 # added   Flexion Strengthening;Both;20 reps;Weights  2# added   Abduction Strengthening;Both;20 reps;Weights  2# added   Shoulder Exercises: Sidelying   Flexion Left;20 reps;Weights  1.5# added   ABduction Left;20 reps  1.5# added   Shoulder Exercises: Standing   Other Standing Exercises conestacking 1.5#  2x 45min  to first shelf   Other Standing Exercises  bil elev/depr  & abd x 10   Shoulder Exercises: Pulleys   Flexion 2 minutes   Shoulder Exercises: ROM/Strengthening   UBE (Upper Arm Bike) no UBE   Other ROM/Strengthening Exercises [place] with red t-band 2 x10   Manual Therapy   Manual Therapy Soft tissue mobilization   Soft tissue mobilization --  along margo medialis to release rhomboids                PT Education - 06/12/15 1132    Education provided Yes   Education Details [place] with red t-band   Person(s) Educated Patient   Methods Explanation;Demonstration;Handout   Comprehension Verbalized understanding;Returned demonstration          PT Short Term Goals - 06/09/15 1301    PT SHORT TERM GOAL #1   Title independent with initial HEP   Time 4   Period Weeks   Status On-going   PT SHORT TERM GOAL #2   Title pain with overhead movements decreased >/= 25%   Time 4   Period Weeks   Status  On-going   PT SHORT TERM GOAL #3   Title pain putting a coat on decreased >/= 25%   Time 4   Period Weeks   Status Achieved           PT Long Term Goals - 06/09/15 1304    PT LONG TERM GOAL #1   Title independent with HEP and unstands how to progress herself   Time 8   Period Weeks   Status On-going   PT LONG TERM GOAL #2   Title pain with putting a coat on decreased >/= 75%   Time 8   Period Weeks   Status Achieved   PT LONG TERM GOAL #3   Title left shoulder strength >/= 4+5 so she can return to her prior gym program   Time 8   Period Weeks   Status On-going   PT LONG TERM GOAL #4   Title pain with fixing her hair decreased >/= 75%   Time 8   Period Weeks   Status On-going   PT LONG TERM GOAL #5   Title FOTO score </= 34% limitation   Time 8   Period Weeks   Status On-going               Plan - 06/12/15 1126    Clinical Impression Statement Patient is a 82 y.o. female with diagnosis of left shoulder weaknes due to fall on 05/03/2015. Patient able to tolerate increase of weight and activity level in PT session well. Pt will continue to improve with Pt for strength   Pt will benefit from skilled therapeutic intervention in order to improve on the following deficits Pain;Decreased strength;Decreased mobility   Rehab Potential Excellent   Clinical Impairments Affecting Rehab Potential History of left breast cancer so no UBE, ultrasound, heat or ice; Osteoporosis so no joint mobilizatioin   PT Frequency 2x / week   PT Duration 8 weeks   PT Treatment/Interventions Electrical Stimulation;Therapeutic exercise;Therapeutic activities;Neuromuscular re-education;Patient/family education;Manual techniques;Passive range of motion   PT Next Visit Plan soft tissue work, ROM to left shoulder, scapular strengthening   PT Home Exercise Plan shoulder strength, ROM exercises   Consulted and Agree with Plan of Care Patient        Problem List Patient Active Problem List    Diagnosis Date Noted  . Chronic anticoagulation   . Ejection fraction   . History of breast cancer  in female 09/21/2011  . Bradycardia   . Aortic valve sclerosis   . Syncope   . Diverticulosis of colon 08/20/2010  . ANXIETY STATE, UNSPECIFIED 06/15/2010  . SCOLIOSIS, LUMBAR SPINE 06/15/2010  . ADENOCARCINOMA, BREAST, HX OF 12/17/2008  . SYNCOPE, HX OF 12/17/2008  . MASTECTOMY, LEFT, HX OF 12/17/2008  . HYPERLIPIDEMIA, WITH HIGH HDL 07/15/2008  . URI 06/02/2007  . OSTEOARTHRITIS 05/08/2007  . DIABETES MELLITUS, TYPE II 02/01/2007  . Essential hypertension 02/01/2007  . OSTEOPOROSIS 02/01/2007  . Atrial fibrillation ([place]) 01/09/2007    NAUMANN-HOUEGNIFIO,Kenyonna Micek PTA 06/12/2015, 11:48 AM  [facility] [address] [place], [place], [address] Phone: [phone]   Fax:  [phone]  Name: Tara Bradley MRN: [id number] Date of Birth: 05-Jul-1922

## 2015-06-16 ENCOUNTER — Encounter: Payer: Self-pay | Admitting: Physical Therapy

## 2015-06-16 ENCOUNTER — Ambulatory Visit: Payer: Medicare HMO | Admitting: Physical Therapy

## 2015-06-16 DIAGNOSIS — R29898 Other symptoms and signs involving the musculoskeletal system: Secondary | ICD-10-CM | POA: Diagnosis not present

## 2015-06-16 DIAGNOSIS — M25611 Stiffness of right shoulder, not elsewhere classified: Secondary | ICD-10-CM

## 2015-06-16 NOTE — Therapy (Signed)
[facility] [address] [place], [place], [address] Phone: [phone]   Fax:  [phone]  Physical Therapy Treatment  Patient Details  Name: Tara Bradley MRN: [id number] Date of Birth: 1922/07/27 Referring Provider: Dr. Mila Palmer  Encounter Date: 06/16/2015      PT End of Session - 06/16/15 1208    Visit Number 4   Number of Visits 10   Date for PT Re-Evaluation 07/30/15   PT Start Time 1145   PT Stop Time 1220   PT Time Calculation (min) 35 min   Activity Tolerance Patient tolerated treatment well   Behavior During Therapy [facility] for tasks assessed/performed      Past Medical History  Diagnosis Date  . Personal history of malignant neoplasm of breast   . Unspecified closed fracture of pelvis 2006  . Closed fracture of unspecified part of femur 2005  . Long term (current) use of anticoagulants   . Atrial fibrillation (HCC)     Paroxysmal, infrequent, Coumadin  . Hyperlipidemia   . Osteoporosis     femur fracture 2005, pelvic fracture 2006  . Arthritis   . Hypertension   . Diabetes mellitus, type 2 (HCC)   . Diverticulosis   . Diverticulitis   . Syncope   . Chest pain     Nuclear, April, 2008, no ischemia,  . Ejection fraction     EF 60%, echo, February, 2008  //   EF 65-70%, echo, November, 2012  . Aortic valve sclerosis     Echo, 2008  . Bradycardia     October, 2012  . Cancer (HCC)   . Chronic insomnia     situational stress    Past Surgical History  Procedure Laterality Date  . Total abdominal hysterectomy w/ bilateral salpingoophorectomy  1995  . Shoulder surgery      left  . Mastectomy  1995    left  . Femur surgery  2005    ORIF  . Wrist surgery       x 2   . Tonsillectomy    . Eye surgery    . Cardioversion N/A 08/09/2012    Procedure: CARDIOVERSION;  Surgeon: Duke Salvia, MD;  Location: [facility] ENDOSCOPY;  Service: Cardiovascular;  Laterality: N/A;  . Shoulder surgery   1979, 2003    There were no vitals filed for this visit.  Visit Diagnosis:  Shoulder weakness  Shoulder stiffness, right      Subjective Assessment - 06/16/15 1150    Subjective No pain.  I want to be discharged.    Pertinent History Patient felt on 05/03/2015 on left shoulder. Many years ago had subluxation left shoulder and surgery    Patient Stated Goals Reduce pain   Currently in Pain? No/denies            [facility] PT Assessment - 06/16/15 0001    Assessment   Medical Diagnosis S46.912A Left shoulder Pain   Onset Date/Surgical Date 05/03/15   Prior Therapy none   Precautions   Precautions Other (comment)   Precaution Comments cancer and osteoporosis precautions  left breast cancer   Prior Function   Level of Independence Independent   Vocation Retired   IT consultant   Overall Cognitive Status Within Functional Limits for tasks assessed   Observation/Other Assessments   Focus on Therapeutic Outcomes (FOTO)  16% limitation   ROM / Strength   AROM / PROM / Strength AROM;PROM;Strength   AROM   Left Shoulder Flexion  135 Degrees   Left Shoulder ABduction 130 Degrees   PROM   Left Shoulder Flexion 170 Degrees   Left Shoulder ABduction 170 Degrees   Left Shoulder Internal Rotation 75 Degrees   Left Shoulder External Rotation 70 Degrees   Strength   Left Shoulder Flexion 4+/5   Left Shoulder ABduction 4/5   Left Shoulder Internal Rotation 4+/5   Left Shoulder External Rotation 4/5                     [facility] Adult PT Treatment/Exercise - 06/16/15 0001    Shoulder Exercises: Standing   External Rotation Strengthening;Right;10 reps;Theraband   Theraband Level (Shoulder External Rotation) Level 2 (Red)   Internal Rotation Strengthening;Right;10 reps;Theraband   Theraband Level (Shoulder Internal Rotation) Level 2 (Red)   Flexion Strengthening;Right;10 reps;Theraband   Theraband Level (Shoulder Flexion) Level 2 (Red)   Extension 10  reps;Strengthening;Right;Theraband   Theraband Level (Shoulder Extension) Level 2 (Red)   Shoulder Exercises: ROM/Strengthening   Other ROM/Strengthening Exercises Nu step with arms 5 min, seat #9, arms #10                PT Education - 06/16/15 1224    Education provided Yes   Education Details reviewed past HEP and patient able to return demonstration correctly   [employer]) Educated Patient   Methods Explanation;Demonstration   Comprehension Verbalized understanding;Returned demonstration          PT Short Term Goals - 06/16/15 1150    PT SHORT TERM GOAL #1   Title independent with initial HEP   Time 4   Period Weeks   Status Achieved   PT SHORT TERM GOAL #2   Title pain with overhead movements decreased >/= 25%   Time 4   Period Weeks   Status Achieved   PT SHORT TERM GOAL #3   Title pain putting a coat on decreased >/= 25%   Time 4   Period Weeks   Status Achieved           PT Long Term Goals - 06/16/15 1151    PT LONG TERM GOAL #1   Title independent with HEP and unstands how to progress herself   Time 8   Period Weeks   Status Achieved   PT LONG TERM GOAL #2   Title pain with putting a coat on decreased >/= 75%   Time 8   Period Weeks   Status Achieved   PT LONG TERM GOAL #3   Title left shoulder strength >/= 4+5 so she can return to her prior gym program   Time 8   Period Weeks   Status Partially Met   PT LONG TERM GOAL #4   Title pain with fixing her hair decreased >/= 75%   Time 8   Period Weeks   Status Achieved   PT LONG TERM GOAL #5   Title FOTO score </= 34% limitation   Time 8   Period Weeks   Status Achieved  16%               Plan - 06/16/15 1209    Clinical Impression Statement Patient is a 82 year old female with diagnosis of left shoulder weakness due to a fall on 05/03/2015.  Patient has met all of her goals with exception of strength.  Left shoulder strength: flexion 4+/5, abduction 4/5, interanl rotation 4+/5,  and external rotation 4/5.  Left shoulder AROM/PROM in degrees: flexion 135/170, abduction 130/170, internal  rotation NT/75, and external rotation NT/70.  Patient is able to put her coat on, wash her hair, and raise her arm overhead without pain.    Pt will benefit from skilled therapeutic intervention in order to improve on the following deficits Pain;Decreased strength;Decreased mobility   Rehab Potential Excellent   Clinical Impairments Affecting Rehab Potential History of left breast cancer so no UBE, ultrasound, heat or ice; Osteoporosis so no joint mobilizatioin   PT Treatment/Interventions Electrical Stimulation;Therapeutic exercise;Therapeutic activities;Neuromuscular re-education;Patient/family education;Manual techniques;Passive range of motion   PT Next Visit Plan Discharge to HEP   PT Home Exercise Plan Current HEP   Consulted and Agree with Plan of Care Patient          G-Codes - 19-Jun-2015 Aug 01, 1204    Functional Assessment Tool Used FOTO score is 16% limitation   Functional Limitation Other PT primary   Other PT Primary Goal Status (Z3299) At least 20 percent but less than 40 percent impaired, limited or restricted   Other PT Primary Discharge Status (M4268) At least 1 percent but less than 20 percent impaired, limited or restricted      Problem List Patient Active Problem List   Diagnosis Date Noted  . Chronic anticoagulation   . Ejection fraction   . History of breast cancer in female 09/21/2011  . Bradycardia   . Aortic valve sclerosis   . Syncope   . Diverticulosis of colon 08/20/2010  . ANXIETY STATE, UNSPECIFIED 2010-06-18  . SCOLIOSIS, LUMBAR SPINE Jun 18, 2010  . ADENOCARCINOMA, BREAST, HX OF 12/17/2008  . SYNCOPE, HX OF 12/17/2008  . MASTECTOMY, LEFT, HX OF 12/17/2008  . HYPERLIPIDEMIA, WITH HIGH HDL 07/15/2008  . URI 06/02/2007  . OSTEOARTHRITIS 05/08/2007  . DIABETES MELLITUS, TYPE II 02/01/2007  . Essential hypertension 02/01/2007  . OSTEOPOROSIS 02/01/2007   . Atrial fibrillation ([place]) 01/09/2007    Earlie Counts, PT 06-19-15 12:26 PM    [facility] [address] [place], [place], [address] Phone: [phone]   Fax:  [phone]  Name: Tara Bradley MRN: [id number] Date of Birth: [birth year]  PHYSICAL THERAPY DISCHARGE SUMMARY  Visits from Start of Care: 4  Current functional level related to goals / functional outcomes: See above.  Patient has not met strength goal due to left shoulder strength ranges from 4-4+/5.   Remaining deficits: See above   Education / Equipment: HEP Plan: Patient agrees to discharge.  Patient goals were partially met. Patient is being discharged due to the patient's request.  Thank you for the referral. Earlie Counts, PT 06-19-15 12:27 PM  ?????

## 2015-06-19 ENCOUNTER — Encounter: Payer: Medicare Other | Admitting: Physical Therapy

## 2015-07-09 ENCOUNTER — Ambulatory Visit (INDEPENDENT_AMBULATORY_CARE_PROVIDER_SITE_OTHER): Payer: Medicare HMO | Admitting: Podiatry

## 2015-07-09 DIAGNOSIS — E1142 Type 2 diabetes mellitus with diabetic polyneuropathy: Secondary | ICD-10-CM | POA: Diagnosis not present

## 2015-07-09 DIAGNOSIS — M2041 Other hammer toe(s) (acquired), right foot: Secondary | ICD-10-CM

## 2015-07-09 DIAGNOSIS — M2042 Other hammer toe(s) (acquired), left foot: Secondary | ICD-10-CM

## 2015-07-09 NOTE — Progress Notes (Signed)
Patient ID: Tara Bradley, female   DOB: 12-Sep-1922, 82 y.o.   MRN: [id number] Patient presents for diabetic shoe pick up, shoes are tried on for good fit.  Patient received 1 Pair Apex [id number] Emmy Tan in women's size 10.5 medium and 3 pairs custom molded diabetic inserts.  Verbal and written break in and wear instructions given.  Patient will follow up for scheduled routine care.    Satisfactory fit of diabetic shoes with heat molded custom foot orthotics.  Return for schedule visit for diabetic foot care or sooner if patient has concern

## 2015-07-09 NOTE — Patient Instructions (Signed)

## 2015-08-19 ENCOUNTER — Other Ambulatory Visit: Payer: Self-pay

## 2015-08-19 DIAGNOSIS — Z9012 Acquired absence of left breast and nipple: Secondary | ICD-10-CM

## 2015-08-19 DIAGNOSIS — Z1231 Encounter for screening mammogram for malignant neoplasm of breast: Secondary | ICD-10-CM

## 2015-09-11 ENCOUNTER — Encounter: Payer: Self-pay | Admitting: Cardiology

## 2015-09-24 ENCOUNTER — Ambulatory Visit
Admission: RE | Admit: 2015-09-24 | Discharge: 2015-09-24 | Disposition: A | Discharge status: Home / Self Care | Payer: Medicare HMO | Source: Ambulatory Visit

## 2015-09-24 DIAGNOSIS — Z9012 Acquired absence of left breast and nipple: Secondary | ICD-10-CM

## 2015-09-24 DIAGNOSIS — Z1231 Encounter for screening mammogram for malignant neoplasm of breast: Secondary | ICD-10-CM

## 2015-12-30 NOTE — Progress Notes (Signed)
HPI The patient presents for followup of atrial fibrillation.  She's been tolerating this rhythm well.   She returns for follow up.  Since I last saw her she's been doing well. She will occasionally at night notice her heart skipping but it doesn't bother her. She'll have some rare lightheadedness. She is exercising 45 minutes 5 days per week.  The patient denies any new symptoms such as chest discomfort, neck or arm discomfort. There has been no new shortness of breath, PND or orthopnea. There have been no reported palpitations, presyncope or syncope.  Allergies  Allergen Reactions  . Ambien [Zolpidem Tartrate]     Hallucinations   . Codeine Other (See Comments)    headache  . Codeine Sulfate     REACTION: unspecified    Current Outpatient Prescriptions  Medication Sig Dispense Refill  . Ascorbic Acid (VITAMIN C PO) Take 1 tablet by mouth daily.    . B Complex-C (B-COMPLEX WITH VITAMIN C) tablet Take 1 tablet by mouth daily.    . Calcium Carbonate-Vitamin D (CALTRATE 600+D) 600-400 MG-UNIT per tablet Take 1 tablet by mouth daily.      . Cholecalciferol (VITAMIN D) 2000 UNITS tablet Take 1,000 Units by mouth daily.     [name] DIGOX 125 MCG tablet Take 1 tablet by mouth  daily 90 tablet 3  . diltiazem (CARDIZEM CD) 300 MG 24 hr capsule Take 1 capsule (300 mg total) by mouth daily. 90 capsule 2  . losartan-hydrochlorothiazide (HYZAAR) 100-12.5 MG per tablet Take 1 tablet by mouth daily. 90 tablet 3  . Magnesium 250 MG TABS Take 1 tablet by mouth daily.      . metFORMIN (GLUCOPHAGE) 1000 MG tablet     . metoprolol tartrate (LOPRESSOR) 25 MG tablet Take 1 tablet by mouth two  times daily 180 tablet 3  . potassium chloride (K-DUR) 10 MEQ tablet     . Saxagliptin-Metformin 2.09-998 MG TB24 Take 1 tablet by mouth daily.    . temazepam (RESTORIL) 15 MG capsule TAKE ONE CAPSULE AT BEDTIME AS NEEDED  5  . XARELTO 15 MG TABS tablet Take 1 tablet by mouth once a day with supper 90 tablet 3  .  potassium chloride (KLOR-CON) 8 MEQ tablet Take 1 tablet (8 mEq total) by mouth daily. 90 tablet 3   No current facility-administered medications for this visit.     Past Medical History:  Diagnosis Date  . Aortic valve sclerosis    Echo, 2008  . Arthritis   . Atrial fibrillation (HCC)    Paroxysmal, infrequent, Coumadin  . Bradycardia    October, 2012  . Cancer ([place])   . Chest pain    Nuclear, April, 2008, no ischemia,  . Chronic insomnia    situational stress  . Closed fracture of unspecified part of femur 2005  . Diabetes mellitus, type 2 ([place])   . Diverticulitis   . Diverticulosis   . Ejection fraction    EF 60%, echo, February, 2008  //   EF 65-70%, echo, November, 2012  . Hyperlipidemia   . Hypertension   . Long term (current) use of anticoagulants   . Osteoporosis    femur fracture 2005, pelvic fracture 2006  . Personal history of malignant neoplasm of breast   . Syncope   . Unspecified closed fracture of pelvis 2006    Past Surgical History:  Procedure Laterality Date  . CARDIOVERSION N/A 08/09/2012   Procedure: CARDIOVERSION;  Surgeon: Deboraha Sprang, MD;  Location: [facility] ENDOSCOPY;  Service: Cardiovascular;  Laterality: N/A;  . EYE SURGERY    . FEMUR SURGERY  2005   ORIF  . MASTECTOMY  1995   left  . SHOULDER SURGERY     left  . [place], 2003  . TONSILLECTOMY    . TOTAL ABDOMINAL HYSTERECTOMY W/ BILATERAL SALPINGOOPHORECTOMY  1995  . WRIST SURGERY      x 2    ROS:  As stated in the HPI and negative for all other systems.  PHYSICAL EXAM BP 128/72   Pulse 88   Ht 5\' 4"  (1.626 m)   Wt 139 lb 3.2 oz (63.1 kg)   BMI 23.89 kg/m  GENERAL:  Well appearing and looks younger than stated age NECK:  No jugular venous distention, waveform within normal limits, carotid upstroke brisk and symmetric, no bruits, no thyromegaly LUNGS:  Clear to auscultation bilaterally CHEST:  Unremarkable HEART:  PMI not displaced or sustained,S1 and S2 within  normal limits, no S3, no clicks, no rubs, no murmurs, irregular ABD:  Flat, positive bowel sounds normal in frequency in pitch, no bruits, no rebound, no guarding, no midline pulsatile mass, no hepatomegaly, no splenomegaly EXT:  2 plus pulses throughout, no edema, no cyanosis no clubbing SKIN:  No rashes no nodules  EKG:  Atrial fibrillation, rate 24 old anteroseptal infarct, nonspecific T-wave changes consistent with digoxin effect.  No significant change previous.  12/31/2015  ASSESSMENT AND PLAN  ATRIAL FIB:  Her heart rate is well controlled. The patient  tolerates this rhythm and rate control and anticoagulation. We will continue with the meds as listed.  [name] has a CHA2DS2 - VASc score of 5 with a risk of stroke of 5.6%.  I will follow up with a CBC and BMET .    HTN:   The blood pressure is at target. No change in medications is indicated. We will continue with therapeutic lifestyle changes (TLC).

## 2015-12-31 ENCOUNTER — Encounter: Payer: Self-pay | Admitting: Cardiology

## 2015-12-31 ENCOUNTER — Ambulatory Visit (INDEPENDENT_AMBULATORY_CARE_PROVIDER_SITE_OTHER): Payer: Medicare HMO | Admitting: Cardiology

## 2015-12-31 ENCOUNTER — Encounter (INDEPENDENT_AMBULATORY_CARE_PROVIDER_SITE_OTHER): Payer: Self-pay

## 2015-12-31 VITALS — BP 128/72 | HR 88 | Ht 64.0 in | Wt 139.2 lb

## 2015-12-31 DIAGNOSIS — I482 Chronic atrial fibrillation, unspecified: Secondary | ICD-10-CM

## 2015-12-31 DIAGNOSIS — Z79899 Other long term (current) drug therapy: Secondary | ICD-10-CM | POA: Diagnosis not present

## 2015-12-31 DIAGNOSIS — I10 Essential (primary) hypertension: Secondary | ICD-10-CM | POA: Diagnosis not present

## 2015-12-31 LAB — CBC
HCT: 41.6 % (ref 35.0–45.0)
Hemoglobin: 13.6 g/dL (ref 11.7–15.5)
MCH: 30.4 pg (ref 27.0–33.0)
MCHC: 32.7 g/dL (ref 32.0–36.0)
MCV: 92.9 fL (ref 80.0–100.0)
MPV: 10.3 fL (ref 7.5–12.5)
Platelets: 203 10*3/uL (ref 140–400)
RBC: 4.48 MIL/uL (ref 3.80–5.10)
RDW: 13.7 % (ref 11.0–15.0)
WBC: 5.4 10*3/uL (ref 3.8–10.8)

## 2015-12-31 NOTE — Patient Instructions (Addendum)
Medication Instructions:  Continue current medications  Labwork: CBC and BMP  Testing/Procedures: NONE  Follow-Up: Your physician wants you to follow-up in: 1 Year. You will receive a reminder letter in the mail two months in advance. If you don't receive a letter, please call our office to schedule the follow-up appointment.   Any Other Special Instructions Will Be Listed Below (If Applicable).   If you need a refill on your cardiac medications before your next appointment, please call your pharmacy.

## 2016-01-01 LAB — BASIC METABOLIC PANEL
BUN: 21 mg/dL (ref 7–25)
CO2: 25 mmol/L (ref 20–31)
Calcium: 9.8 mg/dL (ref 8.6–10.4)
Chloride: 99 mmol/L (ref 98–110)
Creat: 0.71 mg/dL (ref 0.60–0.88)
Glucose, Bld: 214 mg/dL — ABNORMAL HIGH (ref 65–99)
Potassium: 4.4 mmol/L (ref 3.5–5.3)
Sodium: 139 mmol/L (ref 135–146)

## 2016-01-29 ENCOUNTER — Other Ambulatory Visit: Payer: Self-pay | Admitting: *Deleted

## 2016-01-29 MED ORDER — METOPROLOL TARTRATE 25 MG PO TABS: 25.0000 mg | ORAL_TABLET | Freq: Two times a day (BID) | ORAL | 3 refills | Status: DC

## 2016-03-15 ENCOUNTER — Other Ambulatory Visit: Payer: Self-pay

## 2016-03-15 MED ORDER — DIGOXIN 125 MCG PO TABS: 125.0000 ug | ORAL_TABLET | Freq: Every day | ORAL | 3 refills | Status: DC

## 2016-03-15 NOTE — Telephone Encounter (Signed)
Rx request sent to pharmacy.  

## 2016-04-08 ENCOUNTER — Ambulatory Visit (HOSPITAL_COMMUNITY)
Admission: RE | Admit: 2016-04-08 | Discharge: 2016-04-08 | Disposition: A | Discharge status: Home / Self Care | Payer: Medicare HMO | Source: Ambulatory Visit | Attending: Internal Medicine | Admitting: Internal Medicine

## 2016-04-08 ENCOUNTER — Encounter (HOSPITAL_COMMUNITY): Payer: Self-pay

## 2016-04-08 DIAGNOSIS — Z87310 Personal history of (healed) osteoporosis fracture: Secondary | ICD-10-CM | POA: Insufficient documentation

## 2016-04-08 DIAGNOSIS — M810 Age-related osteoporosis without current pathological fracture: Secondary | ICD-10-CM | POA: Insufficient documentation

## 2016-04-08 MED ORDER — SODIUM CHLORIDE 0.9 % IV SOLN
Freq: Once | INTRAVENOUS | Status: AC
  Administered 2016-04-08: 14:00:00 via INTRAVENOUS

## 2016-04-08 MED ORDER — ZOLEDRONIC ACID 5 MG/100ML IV SOLN
5.0000 mg | Freq: Once | INTRAVENOUS | Status: AC
  Administered 2016-04-08: 5 mg via INTRAVENOUS
  Filled 2016-04-08: qty 100

## 2016-04-08 NOTE — Discharge Instructions (Signed)
Zoledronic Acid injection (Paget's Disease, Osteoporosis) °What is this medicine? °ZOLEDRONIC ACID (ZOE le dron ik AS id) lowers the amount of calcium loss from bone. It is used to treat Paget's disease and osteoporosis in women. °COMMON BRAND NAME(S): Reclast, Zometa °What should I tell my health care provider before I take this medicine? °They need to know if you have any of these conditions: °-aspirin-sensitive asthma °-cancer, especially if you are receiving medicines used to treat cancer °-dental disease or wear dentures °-infection °-kidney disease °-low levels of calcium in the blood °-past surgery on the parathyroid gland or intestines °-receiving corticosteroids like dexamethasone or prednisone °-an unusual or allergic reaction to zoledronic acid, other medicines, foods, dyes, or preservatives °-pregnant or trying to get pregnant °-breast-feeding °How should I use this medicine? °This medicine is for infusion into a vein. It is given by a health care professional in a hospital or clinic setting. °Talk to your pediatrician regarding the use of this medicine in children. This medicine is not approved for use in children. °What if I miss a dose? °It is important not to miss your dose. Call your doctor or health care professional if you are unable to keep an appointment. °What may interact with this medicine? °-certain antibiotics given by injection °-NSAIDs, medicines for pain and inflammation, like ibuprofen or naproxen °-some diuretics like bumetanide, furosemide °-teriparatide °What should I watch for while using this medicine? °Visit your doctor or health care professional for regular checkups. It may be some time before you see the benefit from this medicine. Do not stop taking your medicine unless your doctor tells you to. Your doctor may order blood tests or other tests to see how you are doing. °Women should inform their doctor if they wish to become pregnant or think they might be pregnant. There is a  potential for serious side effects to an unborn child. Talk to your health care professional or pharmacist for more information. °You should make sure that you get enough calcium and vitamin D while you are taking this medicine. Discuss the foods you eat and the vitamins you take with your health care professional. °Some people who take this medicine have severe bone, joint, and/or muscle pain. This medicine may also increase your risk for jaw problems or a broken thigh bone. Tell your doctor right away if you have severe pain in your jaw, bones, joints, or muscles. Tell your doctor if you have any pain that does not go away or that gets worse. °Tell your dentist and dental surgeon that you are taking this medicine. You should not have major dental surgery while on this medicine. See your dentist to have a dental exam and fix any dental problems before starting this medicine. Take good care of your teeth while on this medicine. Make sure you see your dentist for regular follow-up appointments. °What side effects may I notice from receiving this medicine? °Side effects that you should report to your doctor or health care professional as soon as possible: °-allergic reactions like skin rash, itching or hives, swelling of the face, lips, or tongue °-anxiety, confusion, or depression °-breathing problems °-changes in vision °-eye pain °-feeling faint or lightheaded, falls °-jaw pain, especially after dental work °-mouth sores °-muscle cramps, stiffness, or weakness °-redness, blistering, peeling or loosening of the skin, including inside the mouth °-trouble passing urine or change in the amount of urine °Side effects that usually do not require medical attention (report to your doctor or health care professional if   they continue or are bothersome): °-bone, joint, or muscle pain °-constipation °-diarrhea °-fever °-hair loss °-irritation at site where injected °-loss of appetite °-nausea, vomiting °-stomach  upset °-trouble sleeping °-trouble swallowing °-weak or tired °Where should I keep my medicine? °This drug is given in a hospital or clinic and will not be stored at home. °© 2017 Elsevier/Gold Standard (2013-10-06 14:19:57) ° °

## 2016-04-08 NOTE — Progress Notes (Signed)
Patient here for reclast infusion. BP on arrival 187/115. Decreased to 140/92. Post infusion BP 167/101. Patient stated she took her BP meds today but sometimes it runs high. She says she feels fine and will more closely monitor her BP with her cuff at home (has not been checking it lately at home). Patient states she will follow up with her medical MD if BP continues to be a concern.

## 2016-04-28 ENCOUNTER — Other Ambulatory Visit: Payer: Self-pay

## 2016-04-28 MED ORDER — RIVAROXABAN 15 MG PO TABS: 15.0000 mg | ORAL_TABLET | Freq: Every day | ORAL | 2 refills | Status: DC

## 2016-06-01 ENCOUNTER — Other Ambulatory Visit: Payer: Self-pay | Admitting: Cardiology

## 2016-06-23 ENCOUNTER — Telehealth: Payer: Self-pay | Admitting: Cardiology

## 2016-06-23 DIAGNOSIS — Z8781 Personal history of (healed) traumatic fracture: Secondary | ICD-10-CM | POA: Diagnosis not present

## 2016-06-23 DIAGNOSIS — M810 Age-related osteoporosis without current pathological fracture: Secondary | ICD-10-CM | POA: Diagnosis not present

## 2016-06-23 DIAGNOSIS — E119 Type 2 diabetes mellitus without complications: Secondary | ICD-10-CM | POA: Diagnosis not present

## 2016-06-23 DIAGNOSIS — Z7984 Long term (current) use of oral hypoglycemic drugs: Secondary | ICD-10-CM | POA: Diagnosis not present

## 2016-06-23 NOTE — Telephone Encounter (Signed)
Tara Bradley is calling to find out if its ok for her take Jardiance .[name]  Please call

## 2016-06-23 NOTE — Telephone Encounter (Signed)
Patient aware of information. Verbalized understanding

## 2016-06-23 NOTE — Telephone Encounter (Signed)
From a cardiac standpoint Jardiance is fine.  It has shown a decrease in mortality in diabetic patients with cardiovascular disease.

## 2016-06-23 NOTE — Telephone Encounter (Signed)
  Left message to call back.  Will defer to Dr [facility] AND PHARMACIST

## 2016-07-07 DIAGNOSIS — E119 Type 2 diabetes mellitus without complications: Secondary | ICD-10-CM | POA: Diagnosis not present

## 2016-07-07 DIAGNOSIS — Z7984 Long term (current) use of oral hypoglycemic drugs: Secondary | ICD-10-CM | POA: Diagnosis not present

## 2016-07-27 ENCOUNTER — Ambulatory Visit (INDEPENDENT_AMBULATORY_CARE_PROVIDER_SITE_OTHER): Payer: Medicare Other | Admitting: Podiatry

## 2016-07-27 ENCOUNTER — Encounter: Payer: Self-pay | Admitting: Podiatry

## 2016-07-27 ENCOUNTER — Ambulatory Visit (INDEPENDENT_AMBULATORY_CARE_PROVIDER_SITE_OTHER): Payer: Medicare Other

## 2016-07-27 DIAGNOSIS — B351 Tinea unguium: Secondary | ICD-10-CM | POA: Diagnosis not present

## 2016-07-27 DIAGNOSIS — E1142 Type 2 diabetes mellitus with diabetic polyneuropathy: Secondary | ICD-10-CM

## 2016-07-27 DIAGNOSIS — R609 Edema, unspecified: Secondary | ICD-10-CM

## 2016-07-27 DIAGNOSIS — R600 Localized edema: Secondary | ICD-10-CM | POA: Diagnosis not present

## 2016-07-27 NOTE — Patient Instructions (Signed)
If youany sudden increase in pain, swelling, redness, drainage, warmth in the second right toe area present to emergency department  Diabetes and Foot Care Diabetes may cause you to have problems because of poor blood supply (circulation) to your feet and legs. This may cause the skin on your feet to become thinner, break easier, and heal more slowly. Your skin may become dry, and the skin may peel and crack. You may also have nerve damage in your legs and feet causing decreased feeling in them. You may not notice minor injuries to your feet that could lead to infections or more serious problems. Taking care of your feet is one of the most important things you can do for yourself. Follow these instructions at home:  Wear shoes at all times, even in the house. Do not go barefoot. Bare feet are easily injured.  Check your feet daily for blisters, cuts, and redness. If you cannot see the bottom of your feet, use a mirror or ask someone for help.  Wash your feet with warm water (do not use hot water) and mild soap. Then pat your feet and the areas between your toes until they are completely dry. Do not soak your feet as this can dry your skin.  Apply a moisturizing lotion or petroleum jelly (that does not contain alcohol and is unscented) to the skin on your feet and to dry, brittle toenails. Do not apply lotion between your toes.  Trim your toenails straight across. Do not dig under them or around the cuticle. File the edges of your nails with an emery board or nail file.  Do not cut corns or calluses or try to remove them with medicine.  Wear clean socks or stockings every day. Make sure they are not too tight. Do not wear knee-high stockings since they may decrease blood flow to your legs.  Wear shoes that fit properly and have enough cushioning. To break in new shoes, wear them for just a few hours a day. This prevents you from injuring your feet. Always look in your shoes before you put them on  to be sure there are no objects inside.  Do not cross your legs. This may decrease the blood flow to your feet.  If you find a minor scrape, cut, or break in the skin on your feet, keep it and the skin around it clean and dry. These areas may be cleansed with mild soap and water. Do not cleanse the area with peroxide, alcohol, or iodine.  When you remove an adhesive bandage, be sure not to damage the skin around it.  If you have a wound, look at it several times a day to make sure it is healing.  Do not use heating pads or hot water bottles. They may burn your skin. If you have lost feeling in your feet or legs, you may not know it is happening until it is too late.  Make sure your health care provider performs a complete foot exam at least annually or more often if you have foot problems. Report any cuts, sores, or bruises to your health care provider immediately. Contact a health care provider if:  You have an injury that is not healing.  You have cuts or breaks in the skin.  You have an ingrown nail.  You notice redness on your legs or feet.  You feel burning or tingling in your legs or feet.  You have pain or cramps in your legs and feet.  Your legs or feet are numb.  Your feet always feel cold. Get help right away if:  There is increasing redness, swelling, or pain in or around a wound.  There is a red line that goes up your leg.  Pus is coming from a wound.  You develop a fever or as directed by your health care provider.  You notice a bad smell coming from an ulcer or wound. This information is not intended to replace advice given to you by your health care provider. Make sure you discuss any questions you have with your health care provider. Document Released: 05/07/2000 Document Revised: 10/16/2015 Document Reviewed: 10/17/2012 Elsevier Interactive Patient Education  2017 [employer].

## 2016-07-27 NOTE — Progress Notes (Signed)
   Subjective:    Patient ID: Tara Bradley, female    DOB: 1923/03/13, 83 y.o.   MRN: [id number]  HPI  This patient presents today requesting replacement diabetic shoes. Patient has existing diabetic shoes and was last evaluated in office on 04/16/2015. Patient also concerned about a thick and second right toenail which cause some discomfort and difficult to trim in the last 4-6 months. Patient also has noticed a 6 day history of a sudden swelling and redness in the third left toe without any history direct injury. Patient states that the redness and swelling seems to be improving.  Patient is diabetic without history of foot ulceration, amputation or claudication Patient states she currently recovering from a yeast infection associated with diabetic medication  Review of Systems  All other systems reviewed and are negative.      Objective:   Physical Exam  Orientated 3  Vascular: No peripheral edema bilaterally Varicosities dorsal ankle and feet bilaterally DP and PT pulses 2/4 bilaterally Capillary reflex immediate bilaterally  Dermatological: The second right toenail is hypertrophic and deformed No open skin lesions bilaterally  No open skin lesions bilaterally  Neurological: Sensation to 10 g monofilament wire intact 2/5 right and 3/5 left Vibratory sensation nonreactive bilaterally Ankle reflex equal and reactive bilaterally  Musculoskeletal: Hammertoe deformities 2- bilaterally There is no restriction ankle, subtalar, midtarsal joints bilaterally The third left toe is erythematous and edematous, sausage-shaped in appearance. There is no warmth or drainage or tenderness  X-ray examination left foot dated 07/27/2016  Intact bony structures without fracture and/or dislocation General decrease in bone density noted in all views Hammer toes 2-5 Decrease in joint spaces particularly in toes 2-5  Radiographic impression: No acute bony abnormality noted in  the x-ray the left foot dated 07/27/2016     Assessment & Plan:   Assessment: Satisfactory vascular status Diabetic peripheral neuropathy Hammertoe 2-5 bilaterally Arthritic inflammation third left toe versus possible low-grade infection third left toe  Plan: Debrided second right toenail mechanically athletic without a bleeding Patient informed that most likely she has a arthritic inflammation in the third left toe versus possible infection. At this time because the erythema and edema are reducing per patient will observe third left toe and reevaluate 7 days. Patient instructed that if she noticed any sudden increase in pain, swelling, redness, fever, drainage in the toe area on the left foot present to the emergency department  Obtain certification for diabetic shoes for the indication of: Hammertoes bilaterally Diabetic peripheral neuropathy  Reappoint 7 days

## 2016-08-09 ENCOUNTER — Ambulatory Visit (INDEPENDENT_AMBULATORY_CARE_PROVIDER_SITE_OTHER): Payer: Medicare Other | Admitting: Podiatry

## 2016-08-09 DIAGNOSIS — R600 Localized edema: Secondary | ICD-10-CM

## 2016-08-09 DIAGNOSIS — G4700 Insomnia, unspecified: Secondary | ICD-10-CM | POA: Diagnosis not present

## 2016-08-09 DIAGNOSIS — E1142 Type 2 diabetes mellitus with diabetic polyneuropathy: Secondary | ICD-10-CM | POA: Diagnosis not present

## 2016-08-09 DIAGNOSIS — R609 Edema, unspecified: Secondary | ICD-10-CM

## 2016-08-09 NOTE — Progress Notes (Signed)
   Subjective:  83 year old female with a history of diabetes presents today as a referral from Dr. Amalia Hailey for second opinion regarding swelling to the third digit left foot. Patient states that she had a massage approximately 2 weeks ago and she states that during massage the massage therapist visit aggressive to the toes and she immediately noticed pain and swelling to the third digit left foot. X-rays taken last week were negative for fracture or dislocation. Patient states that the pain is getting better.    Objective/Physical Exam General: The patient is alert and oriented x3 in no acute distress.  Dermatology: Skin is warm, dry and supple bilateral lower extremities. Negative for open lesions or macerations.  Vascular: Palpable pedal pulses bilaterally. No edema or erythema noted. Capillary refill within normal limits.  Neurological: Epicritic and protective threshold grossly intact bilaterally.   Musculoskeletal Exam: Pain on palpation to the third digit left foot. There is localized edema with ecchymosis noted diffusely throughout the third digit left foot. Hammertoe contracture deformity digits 2-5 bilateral as well as hallux abductovalgus with bunion deformity bilateral.  Assessment: #1 traumatic injury third digit left foot #2 pain and bruising third digit left foot   Plan of Care:  #1 Patient was evaluated. #2 since the patient's improving we will just monitor the patient for now. #3 authorization for diabetic shoes was initiated today. #4 return to clinic when necessary   Edrick Kins, DPM [facility]  Dr. Edrick Kins, [address]                                        [place], [place] [address]                Office [phone]  Fax [phone]

## 2016-08-27 ENCOUNTER — Other Ambulatory Visit: Payer: Self-pay | Admitting: Cardiology

## 2016-09-09 DIAGNOSIS — H43813 Vitreous degeneration, bilateral: Secondary | ICD-10-CM | POA: Diagnosis not present

## 2016-09-09 DIAGNOSIS — E119 Type 2 diabetes mellitus without complications: Secondary | ICD-10-CM | POA: Diagnosis not present

## 2016-09-09 DIAGNOSIS — H353132 Nonexudative age-related macular degeneration, bilateral, intermediate dry stage: Secondary | ICD-10-CM | POA: Diagnosis not present

## 2016-09-16 DIAGNOSIS — M4127 Other idiopathic scoliosis, lumbosacral region: Secondary | ICD-10-CM | POA: Diagnosis not present

## 2016-09-16 DIAGNOSIS — M546 Pain in thoracic spine: Secondary | ICD-10-CM | POA: Diagnosis not present

## 2016-09-21 DIAGNOSIS — M810 Age-related osteoporosis without current pathological fracture: Secondary | ICD-10-CM | POA: Diagnosis not present

## 2016-09-21 DIAGNOSIS — E119 Type 2 diabetes mellitus without complications: Secondary | ICD-10-CM | POA: Diagnosis not present

## 2016-09-21 DIAGNOSIS — Z8781 Personal history of (healed) traumatic fracture: Secondary | ICD-10-CM | POA: Diagnosis not present

## 2016-09-23 DIAGNOSIS — M546 Pain in thoracic spine: Secondary | ICD-10-CM | POA: Diagnosis not present

## 2016-09-24 DIAGNOSIS — M549 Dorsalgia, unspecified: Secondary | ICD-10-CM | POA: Diagnosis not present

## 2016-09-24 DIAGNOSIS — R55 Syncope and collapse: Secondary | ICD-10-CM | POA: Diagnosis not present

## 2016-09-28 ENCOUNTER — Emergency Department (HOSPITAL_COMMUNITY): Payer: Medicare Other

## 2016-09-28 ENCOUNTER — Emergency Department (HOSPITAL_COMMUNITY)
Admission: EM | Admit: 2016-09-28 | Discharge: 2016-09-28 | Disposition: A | Discharge status: Home / Self Care | Payer: Medicare Other | Attending: Emergency Medicine | Admitting: Emergency Medicine

## 2016-09-28 ENCOUNTER — Encounter (HOSPITAL_COMMUNITY): Payer: Self-pay

## 2016-09-28 DIAGNOSIS — M545 Low back pain, unspecified: Secondary | ICD-10-CM

## 2016-09-28 DIAGNOSIS — M549 Dorsalgia, unspecified: Secondary | ICD-10-CM

## 2016-09-28 DIAGNOSIS — Y92009 Unspecified place in unspecified non-institutional (private) residence as the place of occurrence of the external cause: Secondary | ICD-10-CM | POA: Insufficient documentation

## 2016-09-28 DIAGNOSIS — X500XXA Overexertion from strenuous movement or load, initial encounter: Secondary | ICD-10-CM | POA: Insufficient documentation

## 2016-09-28 DIAGNOSIS — R9431 Abnormal electrocardiogram [ECG] [EKG]: Secondary | ICD-10-CM | POA: Diagnosis not present

## 2016-09-28 DIAGNOSIS — E119 Type 2 diabetes mellitus without complications: Secondary | ICD-10-CM | POA: Diagnosis not present

## 2016-09-28 DIAGNOSIS — Z7984 Long term (current) use of oral hypoglycemic drugs: Secondary | ICD-10-CM | POA: Insufficient documentation

## 2016-09-28 DIAGNOSIS — Z7901 Long term (current) use of anticoagulants: Secondary | ICD-10-CM | POA: Diagnosis not present

## 2016-09-28 DIAGNOSIS — Y9389 Activity, other specified: Secondary | ICD-10-CM | POA: Diagnosis not present

## 2016-09-28 DIAGNOSIS — Y999 Unspecified external cause status: Secondary | ICD-10-CM | POA: Insufficient documentation

## 2016-09-28 DIAGNOSIS — M5136 Other intervertebral disc degeneration, lumbar region: Secondary | ICD-10-CM | POA: Diagnosis not present

## 2016-09-28 DIAGNOSIS — R109 Unspecified abdominal pain: Secondary | ICD-10-CM | POA: Diagnosis not present

## 2016-09-28 DIAGNOSIS — I10 Essential (primary) hypertension: Secondary | ICD-10-CM | POA: Insufficient documentation

## 2016-09-28 DIAGNOSIS — S3992XA Unspecified injury of lower back, initial encounter: Secondary | ICD-10-CM | POA: Diagnosis present

## 2016-09-28 MED ORDER — KETOROLAC TROMETHAMINE 60 MG/2ML IM SOLN
15.0000 mg | Freq: Once | INTRAMUSCULAR | Status: AC
  Administered 2016-09-28: 15 mg via INTRAMUSCULAR
  Filled 2016-09-28: qty 2

## 2016-09-28 MED ORDER — DIAZEPAM 2 MG PO TABS
2.0000 mg | ORAL_TABLET | Freq: Once | ORAL | Status: AC
  Administered 2016-09-28: 2 mg via ORAL
  Filled 2016-09-28: qty 1

## 2016-09-28 NOTE — ED Triage Notes (Signed)
Patient states she has been having left lower back pain for several weeks. Patient states she has seen her PCP and a chiropractor for the pain and the pain is worse. Patient denies pain radiating into her legs. ptient reports the pain is causing her to have heart palpitations. Patient has chronic atrial fib.

## 2016-09-28 NOTE — Discharge Instructions (Signed)
Take tylenol 1000mg(2 extra strength) four times a day.  ° °

## 2016-09-28 NOTE — ED Notes (Signed)
Patient is alert and oriented x3.  She was given DC instructions and follow up visit instructions.  Patient gave verbal understanding. She was DC ambulatory under her own power to home.  V/S stable.  He was not showing any signs of distress on DC 

## 2016-09-28 NOTE — ED Provider Notes (Signed)
[facility] DEPT [facility] Provider Note   CSN: [id number] Arrival date & time: 09/28/16  1134     History   Chief Complaint Chief Complaint  Patient presents with  . Back Pain  . Atrial Fibrillation    HPI DARSHAY DEUPREE is a 83 y.o. female.  83 yo F with a cc of low back pain.  Going on for the past month or so. Patient is the sole caregiver for her husband who is increasingly demented and she has to do multiple lifting event at home. Had initially right-sided low back pain and now to switch to the left. Denies abdominal pain denies rheumatic injury denies loss of bowel or bladder numbness. She denies weakness. Has been laying around for most of the day because anything else she does makes it worse. Has seen her family physician as well as orthopedics for the same. Is scheduled to start physical therapy soon. Remote history of breast cancer. Denies recent injection.   The history is provided by the patient.  Back Pain   This is a chronic problem. The current episode started more than 1 week ago. The problem occurs constantly. The problem has been gradually worsening. The pain is associated with lifting heavy objects. The pain is present in the lumbar spine. The quality of the pain is described as stabbing and shooting. The pain does not radiate. The pain is at a severity of 9/10. The pain is moderate. The symptoms are aggravated by bending and twisting. Pertinent negatives include no chest pain, no fever, no headaches and no dysuria. She has tried analgesics for the symptoms. The treatment provided no relief.  Atrial Fibrillation  Pertinent negatives include no chest pain, no headaches and no shortness of breath.    Past Medical History:  Diagnosis Date  . Aortic valve sclerosis    Echo, 2008  . Arthritis   . Atrial fibrillation (HCC)    Paroxysmal, infrequent, Coumadin  . Bradycardia    October, 2012  . Cancer ([place])   . Chest pain    Nuclear, April, 2008, no ischemia,  .  Chronic insomnia    situational stress  . Closed fracture of unspecified part of femur 2005  . Diabetes mellitus, type 2 ([place])   . Diverticulitis   . Diverticulosis   . Ejection fraction    EF 60%, echo, February, 2008  //   EF 65-70%, echo, November, 2012  . Hyperlipidemia   . Hypertension   . Long term (current) use of anticoagulants   . Osteoporosis    femur fracture 2005, pelvic fracture 2006  . Personal history of malignant neoplasm of breast   . Syncope   . Unspecified closed fracture of pelvis 2006    Patient Active Problem List   Diagnosis Date Noted  . Chronic anticoagulation   . Ejection fraction   . History of breast cancer in female 09/21/2011  . Bradycardia   . Aortic valve sclerosis   . Syncope   . Diverticulosis of colon 08/20/2010  . ANXIETY STATE, UNSPECIFIED 06/15/2010  . SCOLIOSIS, LUMBAR SPINE 06/15/2010  . ADENOCARCINOMA, BREAST, HX OF 12/17/2008  . SYNCOPE, HX OF 12/17/2008  . MASTECTOMY, LEFT, HX OF 12/17/2008  . HYPERLIPIDEMIA, WITH HIGH HDL 07/15/2008  . URI 06/02/2007  . OSTEOARTHRITIS 05/08/2007  . DIABETES MELLITUS, TYPE II 02/01/2007  . Essential hypertension 02/01/2007  . OSTEOPOROSIS 02/01/2007  . Atrial fibrillation ([place]) 01/09/2007    Past Surgical History:  Procedure Laterality Date  . CARDIOVERSION N/A 08/09/2012  Procedure: CARDIOVERSION;  Surgeon: Deboraha Sprang, MD;  Location: [facility];  Service: Cardiovascular;  Laterality: N/A;  . EYE SURGERY    . FEMUR SURGERY  2005   ORIF  . MASTECTOMY  1995   left  . SHOULDER SURGERY     left  . [place], 2003  . TONSILLECTOMY    . TOTAL ABDOMINAL HYSTERECTOMY W/ BILATERAL SALPINGOOPHORECTOMY  1995  . WRIST SURGERY      x 2     OB History    No data available       Home Medications    Prior to Admission medications   Medication Sig Start Date End Date Taking? Authorizing Provider  Ascorbic Acid (VITAMIN C PO) Take 1 tablet by mouth daily.   Yes [provider]  B Complex-C (B-COMPLEX WITH VITAMIN C) tablet Take 1 tablet by mouth daily.   Yes [provider]  Calcium Carbonate-Vitamin D (CALTRATE 600+D) 600-400 MG-UNIT per tablet Take 1 tablet by mouth daily.     Yes [provider]  CARTIA XT 300 MG 24 hr capsule TAKE 1 CAPSULE (300 MG TOTAL) BY MOUTH DAILY. 08/30/16  Yes [name], MD  Cholecalciferol (VITAMIN D) 2000 UNITS tablet Take 1,000 Units by mouth daily.    Yes [provider]  digoxin (DIGOX) 0.125 MG tablet Take 1 tablet (125 mcg total) by mouth daily. 03/15/16  Yes [name], MD  losartan-hydrochlorothiazide (HYZAAR) 100-12.5 MG per tablet Take 1 tablet by mouth daily. 05/29/13  Yes Ricard Dillon, MD  Magnesium 250 MG TABS Take 1 tablet by mouth daily.     Yes [provider]  metFORMIN (GLUMETZA) 500 MG (MOD) 24 hr tablet Take 1,000 mg by mouth every evening.   Yes [provider]  metoprolol tartrate (LOPRESSOR) 25 MG tablet Take 1 tablet (25 mg total) by mouth 2 (two) times daily. 01/29/16  Yes [name], MD  potassium chloride (K-DUR) 10 MEQ tablet  03/25/15  Yes [provider]  Rivaroxaban (XARELTO) 15 MG TABS tablet Take 1 tablet (15 mg total) by mouth daily with supper. 04/28/16  Yes [name], MD  temazepam (RESTORIL) 15 MG capsule TAKE ONE CAPSULE AT BEDTIME AS NEEDED 03/31/15  Yes [provider]  potassium chloride (KLOR-CON) 8 MEQ tablet Take 1 tablet (8 mEq total) by mouth daily. Patient not taking: Reported on 09/28/2016 05/29/13 09/28/16  Ricard Dillon, MD    Family History Family History  Problem Relation Age of Onset  . Congestive Heart Failure Mother 97  . Heart attack Father 51  . Diabetes type II Father   . Diabetes type II Brother     1/2  . Multiple myeloma Brother     2/2  . Diabetes type II Son   . Colon cancer Neg Hx     Social History Social History  Substance Use Topics  . Smoking status: Never Smoker  .  Smokeless tobacco: Never Used  . Alcohol use No     Allergies   Ambien [zolpidem tartrate]; Codeine; and Codeine sulfate   Review of Systems Review of Systems  Constitutional: Negative for chills and fever.  HENT: Negative for congestion and rhinorrhea.   Eyes: Negative for redness and visual disturbance.  Respiratory: Negative for shortness of breath and wheezing.   Cardiovascular: Negative for chest pain and palpitations.  Gastrointestinal: Negative for nausea and vomiting.  Genitourinary: Negative for dysuria and urgency.  Musculoskeletal: Positive for arthralgias, back  pain and myalgias.  Skin: Negative for pallor and wound.  Neurological: Negative for dizziness and headaches.     Physical Exam Updated Vital Signs Ht _0  (1.626 m)   Wt 135 lb (61.2 kg)   BMI 23.17 kg/m   Physical Exam  Constitutional: She is oriented to person, place, and time. She appears well-developed and well-nourished. No distress.  HENT:  Head: Normocephalic and atraumatic.  Eyes: EOM are normal. Pupils are equal, round, and reactive to light.  Neck: Normal range of motion. Neck supple.  Cardiovascular: Normal rate and regular rhythm.  Exam reveals no gallop and no friction rub.   No murmur heard. Pulmonary/Chest: Effort normal. She has no wheezes. She has no rales.  Abdominal: Soft. She exhibits no distension and no mass. There is no tenderness. There is no guarding.  Musculoskeletal: She exhibits tenderness (left SI). She exhibits no edema.  Pulse motor and sensation is intact to bilateral lower extremities. No clonus no hyperreflexia.  Neurological: She is alert and oriented to person, place, and time.  Skin: Skin is warm and dry. She is not diaphoretic.  Psychiatric: She has a normal mood and affect. Her behavior is normal.  Nursing note and vitals reviewed.    ED Treatments / Results  Labs (all labs ordered are listed, but only abnormal results are displayed) Labs Reviewed - No  data to display  EKG  EKG Interpretation  Date/Time:  Tuesday Sep 28 2016 11:43:38 EDT Ventricular Rate:  75 PR Interval:    QRS Duration: 91 QT Interval:  322 QTC Calculation: 360 R Axis:   -49 Text Interpretation:  Atrial fibrillation Left anterior fascicular block Anterior infarct, old Baseline wander in lead(s) I III aVR aVL No significant change since last tracing Confirmed by Riker Collier MD, DANIEL [id number]) on 09/28/2016 12:30:10 PM       Radiology Ct L-spine No Charge  Result Date: 09/28/2016 CLINICAL DATA:  Left flank pain. Patient has to lift demented has been at home and think she has pulled muscle. No extremity radiation EXAM: CT LUMBAR SPINE WITHOUT CONTRAST TECHNIQUE: Lumbar spine reformats of abdominal CT were generated as requested. COMPARISON: COMPARISON:  None. FINDINGS: Segmentation: There are 5 lumbar type vertebral bodies, but the right L5 transverse processes is non segmented from the right sacral ala. Alignment: Prominent scoliosis. Grade 1 anterolisthesis at L4-5, facet mediated. Mild retrolisthesis at L1-2. Vertebrae: No evidence of acute fracture. No endplate erosion or evidence of focal bone lesion. Remote right sacral ala fracture. Paraspinal and other soft tissues: Described on separate abdominal CT Disc levels: T12- L1: Unremarkable. L1-L2: Advanced degenerative disc narrowing, worse towards the right. Mild retrolisthesis, greater on the right, with advanced right foraminal stenosis. Patent spinal canal L2-L3: Asymmetric degenerative disc narrowing, advanced on the right where there is far-lateral endplate spurring and disc bulging. Mild facet degenerative spurring. No evidence of impingement L3-L4: Advanced disc narrowing but only minimal annulus bulging. Facet spurring. No evidence of impingement L4-L5: Severe facet arthropathy with grade 1 anterolisthesis, joint distortion, and spurring. The disc is narrowed and mildly bulging. Bilateral recess narrowing. Mild borderline  moderate bilateral foraminal narrowing. L5-S1:No degenerative changes due to partial non segmentation. IMPRESSION: 1. No acute finding. 2. Levoscoliosis and advanced spinal degeneration as described. Electronically Signed   By: Monte Fantasia M.D.   On: 09/28/2016 15:06   Ct Renal Stone Study  Result Date: 09/28/2016 CLINICAL DATA:  Left flank pain. Patient thinks she may have pulled a muscle. EXAM: CT ABDOMEN AND  PELVIS WITHOUT CONTRAST TECHNIQUE: Multidetector CT imaging of the abdomen and pelvis was performed following the standard protocol without IV contrast. COMPARISON:  None. FINDINGS: Lower chest:  No contributory findings. Hepatobiliary: 18 mm subcapsular cyst in the right liver.Cholelithiasis. No evidence of cholecystitis. Pancreas: Unremarkable. Spleen: Unremarkable. Adrenals/Urinary Tract: Negative adrenals. No hydronephrosis or stone. 7 mm high-density area in the interpolar right kidney, usually a hemorrhagic cyst. Subcentimeter low-density in the upper pole right kidney is statistically a cyst. Unremarkable bladder. Stomach/Bowel: No obstruction or inflammation. Formed stool throughout most of the colon Vascular/Lymphatic: Extensive atherosclerotic calcification. No adenopathy. Reproductive:Hysterectomy.  Negative adnexae. Other: Discrete fatty mass in the right retroperitoneum, displacing bowel medially and extending from the iliac fossa to the level of the right lower liver. There are scattered internal calcifications. No internal solid components are noted. Mass measures approximately 20 cm in length and up to 4 x 10 cm in transaxial dimension. Fatty right inguinal hernia. Musculoskeletal: Remote bilateral obturator ring and right sacral ala fractures. Lumbar spine reformats described separately. No acute osseous finding. IMPRESSION: 1. No acute finding to explain left flank pain. No hydronephrosis or urolithiasis. 2. Large right retroperitoneal fatty mass extending from the pelvis to the  diaphragm. 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 3. Cholelithiasis. 4. Moderate stool volume. Electronically Signed   By: Monte Fantasia M.D.   On: 09/28/2016 14:59    Procedures Procedures (including critical care time)  Medications Ordered in ED Medications  ketorolac (TORADOL) injection 15 mg (15 mg Intramuscular Given 09/28/16 1340)  diazepam (VALIUM) tablet 2 mg (2 mg Oral Given 09/28/16 1340)     Initial Impression / Assessment and Plan / ED Course  I have reviewed the triage vital signs and the nursing notes.  Pertinent labs & imaging results that were available during my care of the patient were reviewed by me and considered in my medical decision making (see chart for details).     83 yo F With a chief complaint of left-sided low back pain. No sciatica. With history of breast cancer. Advanced age will obtain CT scans.  CT negative for AAA or other acute concern.  L spine without fx or met.  D/c home.  PCP and ortho follow up.     I have discussed the diagnosis/risks/treatment options with the patient and believe the pt to be eligible for discharge home to follow-up with PCP, Ortho. We also discussed returning to the ED immediately if new or worsening sx occur. We discussed the sx which are most concerning (e.g., sudden worsening pain, fever, inability to tolerate by mouth) that necessitate immediate return. Medications administered to the patient during their visit and any new prescriptions provided to the patient are listed below.  Medications given during this visit Medications  ketorolac (TORADOL) injection 15 mg (15 mg Intramuscular Given 09/28/16 1340)  diazepam (VALIUM) tablet 2 mg (2 mg Oral Given 09/28/16 1340)     The patient appears reasonably screen and/or stabilized for discharge and I doubt any other medical condition or other [facility] requiring further screening, evaluation, or treatment in the ED at this time prior to discharge.      Final Clinical Impressions(s) / ED Diagnoses   Final diagnoses:  Acute right-sided low back pain without sciatica    New Prescriptions Discharge Medication List as of 09/28/2016  3:13 PM       Deno Etienne, DO 09/29/16 [id number]

## 2016-09-30 DIAGNOSIS — R1010 Upper abdominal pain, unspecified: Secondary | ICD-10-CM | POA: Diagnosis not present

## 2016-09-30 DIAGNOSIS — Z7984 Long term (current) use of oral hypoglycemic drugs: Secondary | ICD-10-CM | POA: Diagnosis not present

## 2016-09-30 DIAGNOSIS — R1900 Intra-abdominal and pelvic swelling, mass and lump, unspecified site: Secondary | ICD-10-CM | POA: Diagnosis not present

## 2016-09-30 DIAGNOSIS — E1165 Type 2 diabetes mellitus with hyperglycemia: Secondary | ICD-10-CM | POA: Diagnosis not present

## 2016-10-06 ENCOUNTER — Telehealth: Payer: Self-pay | Admitting: Hematology

## 2016-10-06 NOTE — Telephone Encounter (Signed)
Appt has been scheduled for the pt to see Dr. Irene Limbo on 5/17 at 3pm. Spoke to [facility] from Dr. Stephanie Acre office with the appt date and time. She will contact the pt.

## 2016-10-07 ENCOUNTER — Other Ambulatory Visit: Payer: Self-pay | Admitting: *Deleted

## 2016-10-07 ENCOUNTER — Inpatient Hospital Stay (HOSPITAL_COMMUNITY)
Admission: AD | Admit: 2016-10-07 | Discharge: 2016-10-10 | DRG: 607 | Disposition: A | Discharge status: Home Health | Payer: Medicare Other | Source: Ambulatory Visit | Attending: Internal Medicine | Admitting: Internal Medicine

## 2016-10-07 ENCOUNTER — Ambulatory Visit (HOSPITAL_BASED_OUTPATIENT_CLINIC_OR_DEPARTMENT_OTHER): Payer: Medicare Other | Admitting: Hematology

## 2016-10-07 ENCOUNTER — Encounter: Payer: Self-pay | Admitting: Hematology

## 2016-10-07 ENCOUNTER — Encounter (HOSPITAL_COMMUNITY): Payer: Self-pay | Admitting: *Deleted

## 2016-10-07 DIAGNOSIS — R0781 Pleurodynia: Secondary | ICD-10-CM | POA: Diagnosis not present

## 2016-10-07 DIAGNOSIS — I480 Paroxysmal atrial fibrillation: Secondary | ICD-10-CM | POA: Diagnosis present

## 2016-10-07 DIAGNOSIS — Z79899 Other long term (current) drug therapy: Secondary | ICD-10-CM | POA: Diagnosis not present

## 2016-10-07 DIAGNOSIS — Z66 Do not resuscitate: Secondary | ICD-10-CM | POA: Diagnosis present

## 2016-10-07 DIAGNOSIS — E118 Type 2 diabetes mellitus with unspecified complications: Secondary | ICD-10-CM | POA: Diagnosis present

## 2016-10-07 DIAGNOSIS — Z853 Personal history of malignant neoplasm of breast: Secondary | ICD-10-CM

## 2016-10-07 DIAGNOSIS — T402X5A Adverse effect of other opioids, initial encounter: Secondary | ICD-10-CM | POA: Diagnosis present

## 2016-10-07 DIAGNOSIS — K5903 Drug induced constipation: Secondary | ICD-10-CM | POA: Diagnosis not present

## 2016-10-07 DIAGNOSIS — K5900 Constipation, unspecified: Secondary | ICD-10-CM

## 2016-10-07 DIAGNOSIS — R1909 Other intra-abdominal and pelvic swelling, mass and lump: Secondary | ICD-10-CM | POA: Diagnosis not present

## 2016-10-07 DIAGNOSIS — I10 Essential (primary) hypertension: Secondary | ICD-10-CM | POA: Diagnosis present

## 2016-10-07 DIAGNOSIS — E785 Hyperlipidemia, unspecified: Secondary | ICD-10-CM | POA: Diagnosis present

## 2016-10-07 DIAGNOSIS — R109 Unspecified abdominal pain: Secondary | ICD-10-CM | POA: Diagnosis not present

## 2016-10-07 DIAGNOSIS — Z7901 Long term (current) use of anticoagulants: Secondary | ICD-10-CM

## 2016-10-07 DIAGNOSIS — G893 Neoplasm related pain (acute) (chronic): Secondary | ICD-10-CM | POA: Diagnosis not present

## 2016-10-07 DIAGNOSIS — J9811 Atelectasis: Secondary | ICD-10-CM | POA: Diagnosis not present

## 2016-10-07 DIAGNOSIS — R1900 Intra-abdominal and pelvic swelling, mass and lump, unspecified site: Secondary | ICD-10-CM

## 2016-10-07 DIAGNOSIS — I482 Chronic atrial fibrillation: Secondary | ICD-10-CM

## 2016-10-07 DIAGNOSIS — K8020 Calculus of gallbladder without cholecystitis without obstruction: Secondary | ICD-10-CM | POA: Diagnosis not present

## 2016-10-07 DIAGNOSIS — D1779 Benign lipomatous neoplasm of other sites: Principal | ICD-10-CM | POA: Diagnosis present

## 2016-10-07 DIAGNOSIS — R918 Other nonspecific abnormal finding of lung field: Secondary | ICD-10-CM | POA: Diagnosis not present

## 2016-10-07 DIAGNOSIS — I4819 Other persistent atrial fibrillation: Secondary | ICD-10-CM | POA: Diagnosis present

## 2016-10-07 DIAGNOSIS — J90 Pleural effusion, not elsewhere classified: Secondary | ICD-10-CM | POA: Diagnosis not present

## 2016-10-07 LAB — GLUCOSE, CAPILLARY
Glucose-Capillary: 238 mg/dL — ABNORMAL HIGH (ref 65–99)
Glucose-Capillary: 308 mg/dL — ABNORMAL HIGH (ref 65–99)

## 2016-10-07 LAB — COMPREHENSIVE METABOLIC PANEL
ALT: 8 U/L — ABNORMAL LOW (ref 14–54)
AST: 16 U/L (ref 15–41)
Albumin: 3.5 g/dL (ref 3.5–5.0)
Alkaline Phosphatase: 132 U/L — ABNORMAL HIGH (ref 38–126)
Anion gap: 11 (ref 5–15)
BUN: 11 mg/dL (ref 6–20)
CO2: 26 mmol/L (ref 22–32)
Calcium: 9.1 mg/dL (ref 8.9–10.3)
Chloride: 92 mmol/L — ABNORMAL LOW (ref 101–111)
Creatinine, Ser: 0.53 mg/dL (ref 0.44–1.00)
GFR calc Af Amer: >60 mL/min (ref >60)
GFR calc non Af Amer: >60 mL/min (ref >60)
Glucose, Bld: 241 mg/dL — ABNORMAL HIGH (ref 65–99)
Potassium: 4 mmol/L (ref 3.5–5.1)
Sodium: 129 mmol/L — ABNORMAL LOW (ref 135–145)
Total Bilirubin: 1 mg/dL (ref 0.3–1.2)
Total Protein: 6.7 g/dL (ref 6.5–8.1)

## 2016-10-07 LAB — CBC
HCT: 41.8 % (ref 36.0–46.0)
Hemoglobin: 14.7 g/dL (ref 12.0–15.0)
MCH: 31.1 pg (ref 26.0–34.0)
MCHC: 35.2 g/dL (ref 30.0–36.0)
MCV: 88.6 fL (ref 78.0–100.0)
Platelets: 255 10*3/uL (ref 150–400)
RBC: 4.72 MIL/uL (ref 3.87–5.11)
RDW: 13.3 % (ref 11.5–15.5)
WBC: 10.1 10*3/uL (ref 4.0–10.5)

## 2016-10-07 LAB — PROTIME-INR
INR: 1.17
Prothrombin Time: 15 seconds (ref 11.4–15.2)

## 2016-10-07 LAB — HEPARIN LEVEL (UNFRACTIONATED): Heparin Unfractionated: 0.89 IU/mL — ABNORMAL HIGH (ref 0.30–0.70)

## 2016-10-07 LAB — APTT: aPTT: 38 seconds — ABNORMAL HIGH (ref 24–36)

## 2016-10-07 MED ORDER — ACETAMINOPHEN 325 MG PO TABS
650.0000 mg | ORAL_TABLET | Freq: Four times a day (QID) | ORAL | Status: DC | PRN
  Administered 2016-10-09 – 2016-10-10 (×2): 650 mg via ORAL
  Filled 2016-10-07 (×2): qty 2

## 2016-10-07 MED ORDER — MORPHINE SULFATE (PF) 4 MG/ML IV SOLN
2.0000 mg | INTRAVENOUS | Status: DC | PRN
  Administered 2016-10-07 – 2016-10-09 (×5): 2 mg via INTRAVENOUS
  Filled 2016-10-07 (×4): qty 1

## 2016-10-07 MED ORDER — HEPARIN (PORCINE) IN NACL 100-0.45 UNIT/ML-% IJ SOLN
850.0000 [IU]/h | INTRAMUSCULAR | Status: DC
  Administered 2016-10-07: 850 [IU]/h via INTRAVENOUS
  Filled 2016-10-07: qty 250

## 2016-10-07 MED ORDER — POLYETHYLENE GLYCOL 3350 17 G PO PACK
17.0000 g | PACK | Freq: Every day | ORAL | Status: DC
  Administered 2016-10-07 – 2016-10-10 (×3): 17 g via ORAL
  Filled 2016-10-07 (×3): qty 1

## 2016-10-07 MED ORDER — HYDROMORPHONE HCL 4 MG/ML IJ SOLN: 0.5000 mg | INTRAMUSCULAR | Status: DC | PRN

## 2016-10-07 MED ORDER — DILTIAZEM HCL ER COATED BEADS 180 MG PO CP24
300.0000 mg | ORAL_CAPSULE | Freq: Every day | ORAL | Status: DC
  Administered 2016-10-08 – 2016-10-10 (×3): 300 mg via ORAL
  Filled 2016-10-07 (×3): qty 1

## 2016-10-07 MED ORDER — HEPARIN BOLUS VIA INFUSION
2000.0000 [IU] | Freq: Once | INTRAVENOUS | Status: AC
  Administered 2016-10-07: 2000 [IU] via INTRAVENOUS
  Filled 2016-10-07: qty 2000

## 2016-10-07 MED ORDER — DIGOXIN 125 MCG PO TABS
125.0000 ug | ORAL_TABLET | Freq: Every day | ORAL | Status: DC
  Administered 2016-10-08 – 2016-10-10 (×3): 125 ug via ORAL
  Filled 2016-10-07 (×3): qty 1

## 2016-10-07 MED ORDER — LOSARTAN POTASSIUM 50 MG PO TABS
100.0000 mg | ORAL_TABLET | Freq: Every day | ORAL | Status: DC
  Administered 2016-10-08 – 2016-10-10 (×3): 100 mg via ORAL
  Filled 2016-10-07 (×3): qty 2

## 2016-10-07 MED ORDER — MAGNESIUM OXIDE 400 (241.3 MG) MG PO TABS
200.0000 mg | ORAL_TABLET | Freq: Every day | ORAL | Status: DC
  Administered 2016-10-09 – 2016-10-10 (×2): 200 mg via ORAL
  Filled 2016-10-07 (×4): qty 1

## 2016-10-07 MED ORDER — HYDROCHLOROTHIAZIDE 12.5 MG PO CAPS
12.5000 mg | ORAL_CAPSULE | Freq: Every day | ORAL | Status: DC
  Administered 2016-10-08 – 2016-10-10 (×3): 12.5 mg via ORAL
  Filled 2016-10-07 (×3): qty 1

## 2016-10-07 MED ORDER — ONDANSETRON HCL 4 MG PO TABS: 4.0000 mg | ORAL_TABLET | Freq: Four times a day (QID) | ORAL | Status: DC | PRN

## 2016-10-07 MED ORDER — MORPHINE SULFATE (PF) 4 MG/ML IV SOLN
INTRAVENOUS | Status: AC
  Filled 2016-10-07: qty 1

## 2016-10-07 MED ORDER — ONDANSETRON HCL 4 MG/2ML IJ SOLN: 4.0000 mg | Freq: Four times a day (QID) | INTRAMUSCULAR | Status: DC | PRN

## 2016-10-07 MED ORDER — LOSARTAN POTASSIUM-HCTZ 100-12.5 MG PO TABS: 1.0000 | ORAL_TABLET | Freq: Every day | ORAL | Status: DC

## 2016-10-07 MED ORDER — INSULIN ASPART 100 UNIT/ML ~~LOC~~ SOLN
0.0000 [IU] | Freq: Three times a day (TID) | SUBCUTANEOUS | Status: DC
Start: 2016-10-08 — End: 2016-10-10
  Administered 2016-10-08: 3 [IU] via SUBCUTANEOUS
  Administered 2016-10-08 – 2016-10-09 (×2): 2 [IU] via SUBCUTANEOUS
  Administered 2016-10-09 – 2016-10-10 (×2): 3 [IU] via SUBCUTANEOUS
  Administered 2016-10-10: 1 [IU] via SUBCUTANEOUS

## 2016-10-07 MED ORDER — POLYETHYLENE GLYCOL 3350 17 G PO PACK: 17.0000 g | PACK | Freq: Every day | ORAL | Status: DC | PRN

## 2016-10-07 MED ORDER — INSULIN ASPART 100 UNIT/ML ~~LOC~~ SOLN
0.0000 [IU] | Freq: Every day | SUBCUTANEOUS | Status: DC
  Administered 2016-10-07: 4 [IU] via SUBCUTANEOUS
  Administered 2016-10-08: 3 [IU] via SUBCUTANEOUS
  Administered 2016-10-09: 4 [IU] via SUBCUTANEOUS

## 2016-10-07 MED ORDER — METOPROLOL TARTRATE 25 MG PO TABS
25.0000 mg | ORAL_TABLET | Freq: Two times a day (BID) | ORAL | Status: DC
  Administered 2016-10-07 – 2016-10-10 (×6): 25 mg via ORAL
  Filled 2016-10-07 (×6): qty 1

## 2016-10-07 MED ORDER — TEMAZEPAM 15 MG PO CAPS
15.0000 mg | ORAL_CAPSULE | Freq: Every day | ORAL | Status: DC
  Administered 2016-10-07 – 2016-10-09 (×3): 15 mg via ORAL
  Filled 2016-10-07 (×3): qty 1

## 2016-10-07 MED ORDER — DOCUSATE SODIUM 100 MG PO CAPS
100.0000 mg | ORAL_CAPSULE | Freq: Two times a day (BID) | ORAL | Status: DC
  Administered 2016-10-07 – 2016-10-10 (×5): 100 mg via ORAL
  Filled 2016-10-07 (×5): qty 1

## 2016-10-07 MED ORDER — SODIUM CHLORIDE 0.9% FLUSH
3.0000 mL | Freq: Two times a day (BID) | INTRAVENOUS | Status: DC
  Administered 2016-10-07 – 2016-10-10 (×5): 3 mL via INTRAVENOUS

## 2016-10-07 MED ORDER — MORPHINE SULFATE 4 MG/ML IJ SOLN
4.0000 mg | Freq: Once | INTRAMUSCULAR | Status: AC
Start: 2016-10-07 — End: 2016-10-07
  Administered 2016-10-07: 4 mg via SUBCUTANEOUS
  Filled 2016-10-07: qty 1

## 2016-10-07 MED ORDER — ACETAMINOPHEN 650 MG RE SUPP: 650.0000 mg | Freq: Four times a day (QID) | RECTAL | Status: DC | PRN

## 2016-10-07 NOTE — Patient Instructions (Signed)
Thank you for choosing [facility] to provide your oncology and hematology care.  To afford each patient quality time with our providers, please arrive 30 minutes before your scheduled appointment time.  If you arrive late for your appointment, you may be asked to reschedule.  We strive to give you quality time with our providers, and arriving late affects you and other patients whose appointments are after yours.  If you are a no show for multiple scheduled visits, you may be dismissed from the clinic at the providers discretion.   Again, thank you for choosing [facility], our hope is that these requests will decrease the amount of time that you wait before being seen by our physicians.  ______________________________________________________________________ Should you have questions after your visit to the [facility], please contact our office at [phone] between the hours of 8:30 and 4:30 p.m.    Voicemails left after 4:30p.m will not be returned until the following business day.   For prescription refill requests, please have your pharmacy contact us directly.  Please also try to allow 48 hours for prescription requests.   Please contact the scheduling department for questions regarding scheduling.  For scheduling of procedures such as PET scans, CT scans, MRI, Ultrasound, etc please contact central scheduling at [phone].   Resources For Cancer Patients and Caregivers:  American Cancer Society:  800-227-2345  Can help patients locate various types of support and financial assistance Cancer Care: 1-800-813-HOPE (4673) Provides financial assistance, online support groups, medication/co-pay assistance.   [place] DSS:  336-641-3447 Where to apply for food stamps, Medicaid, and utility assistance Medicare Rights Center: 800-333-4114 Helps people with Medicare understand their rights and benefits, navigate the Medicare system, and secure the  quality healthcare they deserve SCAT: 336-333-6589 [place] [employer]'s shared-ride transportation service for eligible riders who have a disability that prevents them from riding the fixed route bus.   For additional information on assistance programs please contact our social worker:   Grier Hock/Abigail Elmore:  [phone] 

## 2016-10-07 NOTE — H&P (Signed)
History and Physical  Tara Bradley BMW:[id number] DOB: 06-14-1922 DOA: 10/07/2016  Referring physician: Dr. Irene Limbo, oncology  PCP: [name], MD  Outpatient Specialists: Dr. Irene Limbo, oncology Patient coming from: Home & is able to ambulate without assistance  Chief Complaint: Left-sided abdominal pain   HPI: Tara Bradley is a 83 y.o. female with medical history significant of hypertension and atrial fibrillation on Xarelto who is actually quite physically active and functional. Starting approximately a week and a half ago she started having severe back pain which initially she felt started on her right side initially but then went over to her left side and stayed there. She described the pain as a 10+/10 and although moved from one side to the other, it did not radiate elsewhere. She presented to the emergency room on 5/8. She underwent a CT renal study looking for kidney stones but there were no findings to explain her left flank pain. No hydronephrosis or urolithiasis. 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  This was discussed with the patient and plans were made for outpatient follow-up. At that time she was given some oral pain medications.   Since then she has continued to have pain and now feels like it is more anterior. The pain is described as continuous present all the time as a dull heavy ache at times spasms into a sharp pain. She does not really think that anything she does makes it better or worse other than if she rolls over and lays on her left side that does seem to give her some relief. Not made better or worse by inspiration or urination. Patient states that the last time she moved her bowels was approximately 7 days ago. Patient had appointment with oncology today for evaluation of this right retroperitoneal mass but she was in such  pain that she was given IV morphine which did little for it. Oncology notify the hospitalist and arrangements were made for patient to come in for pain control, evaluation for pain as well as biopsy of retroperitoneal mass.   Review of Systems: Patient seen after arrival to floor . Pt initially complaining of severe 10/10 left-sided pain in her abdomen mostly in the left upper and left middle quadrant. Midway through our exam, her pain started to settle down, likely from previous dose of morphine given 45 minutes prior plus being able to then move over to her left side some. She also complains of constipation which is new for her and has not moved her bowels in 8 days.  Pt denies any headache, vision changes, dysphagia, chest pain, palpitations, shortness of breath, wheeze, cough, hematuria, dysuria, diarrhea, focal extremity numbness weakness or pain .  Review of systems are otherwise negative   Past Medical History:  Diagnosis Date  . Aortic valve sclerosis    Echo, 2008  . Arthritis   . Atrial fibrillation (HCC)    Paroxysmal, infrequent, Coumadin  . Bradycardia    October, 2012  . Cancer ([place])   . Chest pain    Nuclear, April, 2008, no ischemia,  . Chronic insomnia    situational stress  . Closed fracture of unspecified part of femur 2005  . Diabetes mellitus, type 2 ([place])   . Diverticulitis   . Diverticulosis   . Ejection fraction    EF 60%, echo, February, 2008  //   EF 65-70%,  echo, November, 2012  . Hyperlipidemia   . Hypertension   . Long term (current) use of anticoagulants   . Osteoporosis    femur fracture 2005, pelvic fracture 2006  . Personal history of malignant neoplasm of breast   . Syncope   . Unspecified closed fracture of pelvis 2006   Past Surgical History:  Procedure Laterality Date  . CARDIOVERSION N/A 08/09/2012   Procedure: CARDIOVERSION;  Surgeon: Deboraha Sprang, MD;  Location: [facility];  Service: Cardiovascular;  Laterality: N/A;  . EYE SURGERY     . FEMUR SURGERY  2005   ORIF  . MASTECTOMY  1995   left  . SHOULDER SURGERY     left  . [place], 2003  . TONSILLECTOMY    . TOTAL ABDOMINAL HYSTERECTOMY W/ BILATERAL SALPINGOOPHORECTOMY  1995  . WRIST SURGERY      x 2     Social History:  reports that she has never smoked. She has never used smokeless tobacco. She reports that she does not drink alcohol or use drugs.  Patient is quite ambulatory and goes to the gym almost every day.  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   Allergies  Allergen Reactions  . Ambien [Zolpidem Tartrate]     Hallucinations   . Codeine Other (See Comments)    headache  . Codeine Sulfate     REACTION: unspecified    Family History  Problem Relation Age of Onset  . Congestive Heart Failure Mother 60  . Heart attack Father 72  . Diabetes type II Father   . Diabetes type II Brother        1/2  . Multiple myeloma Brother        2/2  . Diabetes type II Son   . Colon cancer Neg Hx       Prior to Admission medications   Medication Sig Start Date End Date Taking? Authorizing Provider  Ascorbic Acid (VITAMIN C PO) Take 1 tablet by mouth daily.   Yes [provider]  B Complex-C (B-COMPLEX WITH VITAMIN C) tablet Take 1 tablet by mouth daily.   Yes [provider]  Calcium Carbonate-Vitamin D (CALTRATE 600+D) 600-400 MG-UNIT per tablet Take 1 tablet by mouth daily.     Yes [provider]  CARTIA XT 300 MG 24 hr capsule TAKE 1 CAPSULE (300 MG TOTAL) BY MOUTH DAILY. 08/30/16  Yes [name], MD  Cholecalciferol (VITAMIN D) 2000 UNITS tablet Take 1,000 Units by mouth daily.    Yes [provider]  digoxin (DIGOX) 0.125 MG tablet Take 1 tablet (125 mcg total) by mouth daily. 03/15/16  Yes [name], MD  losartan-hydrochlorothiazide (HYZAAR) 100-12.5 MG per tablet Take 1 tablet by mouth daily. 05/29/13  Yes Ricard Dillon, MD  Magnesium 250 MG TABS Take 1  tablet by mouth daily.     Yes [provider]  metFORMIN (GLUMETZA) 500 MG (MOD) 24 hr tablet Take 1,000 mg by mouth every evening.   Yes [provider]  metoprolol tartrate (LOPRESSOR) 25 MG tablet Take 1 tablet (25 mg total) by mouth 2 (two) times daily. 01/29/16  Yes [name], MD  potassium chloride (K-DUR) 10 MEQ tablet  03/25/15  Yes [provider]  potassium chloride (KLOR-CON) 8 MEQ tablet Take 1 tablet (8 mEq total) by mouth daily. 05/29/13 10/07/16 Yes Ricard Dillon, MD  Rivaroxaban (XARELTO) 15 MG TABS tablet Take 1  tablet (15 mg total) by mouth daily with supper. 04/28/16  Yes [name], MD  temazepam (RESTORIL) 15 MG capsule TAKE ONE CAPSULE AT BEDTIME AS NEEDED 03/31/15  Yes [provider]    Physical Exam: There were no vitals taken for this visit.  General:   Alert and oriented 3, initially in distress secondary to abdominal pain. Once the settle down, she is feeling fatigued Eyes: Sclera nonicteric, extraocular movements are intact  ENT: Normocephalic, atraumatic, mucous membranes are dry  Neck: Supple, no JVD  Cardiovascular:   irregular rhythm, rate controlled  Respiratory: clear to auscultation bilaterally   Abdomen soft, abdomen is actually nontender and with palpation, this does not seem to affect her pain, nondistended, hypoactive bowel sounds Musculoskeletal: No clubbing or cyanosis or edema  Psychiatric: patient is appropriate, no evidence of psychoses Skin: No skin breaks, tears or lesions  Neurologic: No focal deficits            Labs on Admission:  Basic Metabolic Panel: No results for input(s): NA, K, CL, CO2, GLUCOSE, BUN, CREATININE, CALCIUM, MG, PHOS in the last 168 hours. Liver Function Tests: No results for input(s): AST, ALT, ALKPHOS, BILITOT, PROT, ALBUMIN in the last 168 hours. No results for input(s): LIPASE, AMYLASE in the last 168 hours. No results for input(s): AMMONIA in the last 168  hours. CBC: No results for input(s): WBC, NEUTROABS, HGB, HCT, MCV, PLT in the last 168 hours. Cardiac Enzymes: No results for input(s): CKTOTAL, CKMB, CKMBINDEX, TROPONINI in the last 168 hours.  BNP (last 3 results) No results for input(s): BNP in the last 8760 hours.  ProBNP (last 3 results) No results for input(s): PROBNP in the last 8760 hours.  CBG: No results for input(s): GLUCAP in the last 168 hours.  Radiological Exams on Admission: No results found.  EKG:   not ordered   Assessment/Plan Present on Admission: . Pelvic mass: Unusual. See below, I do not think that this is necessarily related to her pain. Nevertheless certainly concerning. Have changed her Xarelto over to heparin and consulted interventional radiology for biopsy. . Left lateral abdominal pain: Very unusual. She has no flank tenderness and I do not necessarily think that this is ureteral or renal. Pain does seem to be somewhat mobile and with deep palpation, this does not seem to affect the quality of the pain. She has not moved her bowels in over a week and there is some possibility that this may be just simply severe severe constipation. We'll plan to check urinalysis to rule out stones or infection area check complete metabolic panel and CBC to look for areas there. Have started her on stool softener plus MiraLAX and if her bowels have not moved, will increase strength of laxative. If this does not affect her pain at all we'll plan to check CT scan of abdomen and pelvis. She is not presenting as a bowel obstruction or blockage.  . DM type 2, controlled, with complication [facility]): Checking A1c, sliding scale. Her sugars look overall stable and I do not think that necessarily her diabetes is causing this new onset distinct one-sided abdominal pain  . Essential hypertension: Stable continue home medications . Atrial fibrillation ([place]) on chronic anticoagulation: Patient's chads 2 score is 4. Currently rate  controlled. Continue meds. Holding Xarelto in place for heparin for planned biopsy . Constipation due to opioid therapy: See above. Patient does not normally have a problem with constipation.  Principal Problem:   Left lateral abdominal pain Active  Problems:   DM type 2, controlled, with complication ([place])   Essential hypertension   Atrial fibrillation (HCC)   Chronic anticoagulation   Pelvic mass   Constipation due to opioid therapy   DVT prophylaxis: Xarelto on hold, on IV heparin   Code Status: DO NOT RESUSCITATE as confirmed by patient   Family Communication: Son and granddaughter at the bedside   Disposition Plan: Anticipate will be here for several days while we help diagnose and treat her abdominal pain as well as biopsy pelvic mass   Consults called: Interventional radiology Oncology notified of admission   Admission status: Patient will be here for several days, passed to midnight's, requiring full and patient services secondary to biopsy secondary to abdominal pain control/diagnosis. Therefore admit her as an inpatient    Annita Brod MD [facility] Pager [phone]  If 7PM-7AM, please contact night-coverage [email or web address] Password [facility]  10/07/2016, 6:02 PM

## 2016-10-07 NOTE — Progress Notes (Signed)
ANTICOAGULATION CONSULT NOTE - Initial Consult  Pharmacy Consult for heparin Indication: atrial fibrillation  Allergies  Allergen Reactions  . Ambien [Zolpidem Tartrate]     Hallucinations   . Codeine Other (See Comments)    headache  . Codeine Sulfate     REACTION: unspecified    Patient Measurements:   Heparin Dosing Weight: 61.2kg  Vital Signs: Temp: 97.7 F (36.5 C) (05/17 1830) Temp Source: Oral (05/17 1830) BP: 153/99 (05/17 1830) Pulse Rate: 61 (05/17 1830)  Labs:  Recent Labs  10/07/16 1845  HGB 14.7  HCT 41.8  PLT 255  CREATININE 0.53    Estimated Creatinine Clearance: 38.7 mL/min (by C-G formula based on SCr of 0.53 mg/dL).   Medical History: Past Medical History:  Diagnosis Date  . Aortic valve sclerosis    Echo, 2008  . Arthritis   . Atrial fibrillation (HCC)    Paroxysmal, infrequent, Coumadin  . Bradycardia    October, 2012  . Cancer ([place])   . Chest pain    Nuclear, April, 2008, no ischemia,  . Chronic insomnia    situational stress  . Closed fracture of unspecified part of femur 2005  . Diabetes mellitus, type 2 ([place])   . Diverticulitis   . Diverticulosis   . Ejection fraction    EF 60%, echo, February, 2008  //   EF 65-70%, echo, November, 2012  . Hyperlipidemia   . Hypertension   . Long term (current) use of anticoagulants   . Osteoporosis    femur fracture 2005, pelvic fracture 2006  . Personal history of malignant neoplasm of breast   . Syncope   . Unspecified closed fracture of pelvis 2006     Assessment: 83 y.o.femalewith medical history significant of hypertension and atrial fibrillation on Xarelto who is actually quite physically active and functional. Started having back pain ~ 1week ago.  Oncology notify the hospitalist and arrangements were made for patient to come in for pain control, evaluation for pain as well as biopsy of retroperitoneal mass. Pharmacy to dose heparin.  Last dose of xarelto 5/16 at  [place]  10/07/2016 CrCl WNL H/H WNL Plts WNL aPTT, PT/INR ordered STAT  Goal of Therapy:  Heparin level 0.3-0.7 units/ml  Monitor platelets by anticoagulation protocol  Plan:   Heparin bolus 2000 units x 1 then  Start heparin drip at 850 units/hr  Heparin/ aPTT level in 8 hours  Use aPTT to monitor until aPTT and HL correlate  F/u on timing of potential biopsy  Dolly Rias RPh 10/07/2016, 8:22 PM Pager [phone]

## 2016-10-07 NOTE — Progress Notes (Signed)
[name]    HEMATOLOGY/ONCOLOGY CONSULTATION NOTE  Date of Service: 10/07/2016  Patient Care Team: [name], MD as PCP - General (Family Medicine)  CHIEF COMPLAINTS/PURPOSE OF CONSULTATION:  New Retroperitoneal Mass  HISTORY OF PRESENTING ILLNESS:  Tara Bradley is a wonderful 83 y.o. female who has been referred to [place] by Dr .Stephanie Acre, Ivin Booty, MD for evaluation and management of newly noted right retroperitoneal mass.  Patient has history of multiple medical comorbidities including hypertension, diabetes type 2, paroxysmal atrial fibrillation on Coumadin, arthritis, remote history of breast cancer who apparently is fairly active and independent at baseline with the ECOG performance status of 2.  She helps take care of her husband who has dementia. Patient presented to the emergency room on 09/28/2016 with acute right-sided lower back pain which she thought might be related to her scoliosis or having tried to lift her husband into his chair.  Patient had a CT of the L-spine which showed levoscoliosis and advanced spinal degeneration. Patient also had a CT of the abdomen renal stone study which showed " a Discrete fatty mass in the right retroperitoneum, displacing bowel medially and extending from the iliac fossa to the level of the right lower liver. There are scattered internal calcifications. No internal solid components are noted. Mass measures approximately 20 cm in length and up to 4 x 10 cm in transaxial dimension. Large right retroperitoneal fatty mass extending from the pelvis to the diaphragm. This may reflect a low grade liposarcoma; no measurable soft tissue components."  Patient notes that her appetite has decreased about 30% and she might have lost about 8-10 pounds over the last few months. Note hematuria or changes in urination. She was given hydrocodone followed by oxycodone which has not been able to control her pain but made her fairly nauseous. She now notes some right  flank and lower back pain as well as significant uncontrolled left lower chest wall pain with significant tenderness to palpation from unclear etiology.  I discussed the imaging findings and possibilities with the patient and her son and granddaughter. Patient was not able to sit for even a couple of minutes and was Very uncomfortable with uncontrolled pain. She needed subcutaneous morphine and we decided to admit her for pain control and to expedite her tissue diagnosis with a CT-guided biopsy as well as to consider an inpatient surgical evaluation.  Patient notes she has been given a referral to see surgery at [facility] [place] [facility].  Patient notes that her bowels do still Xarelto was last evening and was asked to hold her next dose so that she could try to get a biopsy in the hospital.      MEDICAL HISTORY:  Past Medical History:  Diagnosis Date  . Aortic valve sclerosis    Echo, 2008  . Arthritis   . Atrial fibrillation (HCC)    Paroxysmal, infrequent, Coumadin  . Bradycardia    October, 2012  . Cancer ([facility])   . Chest pain    Nuclear, April, 2008, no ischemia,  . Chronic insomnia    situational stress  . Closed fracture of unspecified part of femur 2005  . Diabetes mellitus, type 2 ([place])   . Diverticulitis   . Diverticulosis   . Ejection fraction    EF 60%, echo, February, 2008  //   EF 65-70%, echo, November, 2012  . Hyperlipidemia   . Hypertension   . Long term (current) use of anticoagulants   . Osteoporosis    femur fracture 2005,  pelvic fracture 2006  . Personal history of malignant neoplasm of breast   . Syncope   . Unspecified closed fracture of pelvis 2006    SURGICAL HISTORY: Past Surgical History:  Procedure Laterality Date  . CARDIOVERSION N/A 08/09/2012   Procedure: CARDIOVERSION;  Surgeon: Deboraha Sprang, MD;  Location: [facility];  Service: Cardiovascular;  Laterality: N/A;  . EYE SURGERY    . FEMUR SURGERY  2005   ORIF  . MASTECTOMY  1995    left  . SHOULDER SURGERY     left  . [place], 2003  . TONSILLECTOMY    . TOTAL ABDOMINAL HYSTERECTOMY W/ BILATERAL SALPINGOOPHORECTOMY  1995  . WRIST SURGERY      x 2     SOCIAL HISTORY: Social History   Social History  . Marital status: Married    Spouse name: N/A  . Number of children: 2  . Years of education: N/A   Occupational History  . Piano Teacher    Social History Main Topics  . Smoking status: Never Smoker  . Smokeless tobacco: Never Used  . Alcohol use No  . Drug use: No  . Sexual activity: Not Currently   Other Topics Concern  . Not on file   Social History Narrative   Husband has advancing Dementia, which has been very difficult for her.    FAMILY HISTORY: Family History  Problem Relation Age of Onset  . Congestive Heart Failure Mother 26  . Heart attack Father 55  . Diabetes type II Father   . Diabetes type II Brother        1/2  . Multiple myeloma Brother        2/2  . Diabetes type II Son   . Colon cancer Neg Hx     ALLERGIES:  is allergic to [employer] tartrate]; codeine; and codeine sulfate.  MEDICATIONS:  Current Outpatient Prescriptions  Medication Sig Dispense Refill  . Ascorbic Acid (VITAMIN C PO) Take 1 tablet by mouth daily.    . B Complex-C (B-COMPLEX WITH VITAMIN C) tablet Take 1 tablet by mouth daily.    . Calcium Carbonate-Vitamin D (CALTRATE 600+D) 600-400 MG-UNIT per tablet Take 1 tablet by mouth daily.      [name] CARTIA XT 300 MG 24 hr capsule TAKE 1 CAPSULE (300 MG TOTAL) BY MOUTH DAILY. 90 capsule 1  . Cholecalciferol (VITAMIN D) 2000 UNITS tablet Take 1,000 Units by mouth daily.     . digoxin (DIGOX) 0.125 MG tablet Take 1 tablet (125 mcg total) by mouth daily. 90 tablet 3  . losartan-hydrochlorothiazide (HYZAAR) 100-12.5 MG per tablet Take 1 tablet by mouth daily. 90 tablet 3  . Magnesium 250 MG TABS Take 1 tablet by mouth daily.      . metFORMIN (GLUMETZA) 500 MG (MOD) 24 hr tablet Take 1,000 mg by  mouth every evening.    . metoprolol tartrate (LOPRESSOR) 25 MG tablet Take 1 tablet (25 mg total) by mouth 2 (two) times daily. 180 tablet 3  . potassium chloride (K-DUR) 10 MEQ tablet     . potassium chloride (KLOR-CON) 8 MEQ tablet Take 1 tablet (8 mEq total) by mouth daily. (Patient not taking: Reported on 09/28/2016) 90 tablet 3  . Rivaroxaban (XARELTO) 15 MG TABS tablet Take 1 tablet (15 mg total) by mouth daily with supper. 90 tablet 2  . temazepam (RESTORIL) 15 MG capsule TAKE ONE CAPSULE AT BEDTIME AS NEEDED  5   No current facility-administered  medications for this visit.     REVIEW OF SYSTEMS:    10 Point review of Systems was done is negative except as noted above.  PHYSICAL EXAMINATION: ECOG PERFORMANCE STATUS: 2 - Symptomatic, <50% confined to bed   Reviewed in EPIC  GENERAL:alert, in no acute distress and comfortable SKIN: no acute rashes, no significant lesions EYES: conjunctiva are pink and non-injected, sclera anicteric OROPHARYNX: MMM, no exudates, no oropharyngeal erythema or ulceration NECK: supple, no JVD LYMPH:  no palpable lymphadenopathy in the cervical, axillary or inguinal regions LUNGS: clear to auscultation b/l with normal respiratory effort HEART: regular rate & rhythm ABDOMEN:  normoactive bowel sounds , non tender, not distended. Extremity: no pedal edema PSYCH: alert & oriented x 3 with fluent speech NEURO: no focal motor/sensory deficits  LABORATORY DATA:  I have reviewed the data as listed  . CBC Latest Ref Rng & Units 12/31/2015 08/08/2012 03/07/2012  WBC 3.8 - 10.8 K/uL 5.4 6.3 -  Hemoglobin 11.7 - 15.5 g/dL 13.6 13.6 11.9(L)  Hematocrit 35.0 - 45.0 % 41.6 39.9 35.0(L)  Platelets 140 - 400 K/uL 203 194.0 -    . CMP Latest Ref Rng & Units 12/31/2015 08/08/2012 03/07/2012  Glucose 65 - 99 mg/dL 214(H) 291(H) 252(H)  BUN 7 - 25 mg/dL 21 26(H) 32(H)  Creatinine 0.60 - 0.88 mg/dL 0.71 0.9 1.10  Sodium 135 - 146 mmol/L 139 139 138  Potassium 3.5  - 5.3 mmol/L 4.4 4.1 3.8  Chloride 98 - 110 mmol/L 99 100 100  CO2 20 - 31 mmol/L 25 30 -  Calcium 8.6 - 10.4 mg/dL 9.8 9.4 -  Total Protein 6.0 - 8.3 g/dL - - -  Total Bilirubin 0.3 - 1.2 mg/dL - - -  Alkaline Phos 39 - 117 U/L - - -  AST 0 - 37 U/L - - -  ALT 0 - 35 U/L - - -     RADIOGRAPHIC STUDIES: I have personally reviewed the radiological images as listed and agreed with the findings in the report. Ct L-spine No Charge  Result Date: 09/28/2016 CLINICAL DATA:  Left flank pain. Patient has to lift demented has been at home and think she has pulled muscle. No extremity radiation EXAM: CT LUMBAR SPINE WITHOUT CONTRAST TECHNIQUE: Lumbar spine reformats of abdominal CT were generated as requested. COMPARISON: COMPARISON:  None. FINDINGS: Segmentation: There are 5 lumbar type vertebral bodies, but the right L5 transverse processes is non segmented from the right sacral ala. Alignment: Prominent scoliosis. Grade 1 anterolisthesis at L4-5, facet mediated. Mild retrolisthesis at L1-2. Vertebrae: No evidence of acute fracture. No endplate erosion or evidence of focal bone lesion. Remote right sacral ala fracture. Paraspinal and other soft tissues: Described on separate abdominal CT Disc levels: T12- L1: Unremarkable. L1-L2: Advanced degenerative disc narrowing, worse towards the right. Mild retrolisthesis, greater on the right, with advanced right foraminal stenosis. Patent spinal canal L2-L3: Asymmetric degenerative disc narrowing, advanced on the right where there is far-lateral endplate spurring and disc bulging. Mild facet degenerative spurring. No evidence of impingement L3-L4: Advanced disc narrowing but only minimal annulus bulging. Facet spurring. No evidence of impingement L4-L5: Severe facet arthropathy with grade 1 anterolisthesis, joint distortion, and spurring. The disc is narrowed and mildly bulging. Bilateral recess narrowing. Mild borderline moderate bilateral foraminal narrowing.  L5-S1:No degenerative changes due to partial non segmentation. IMPRESSION: 1. No acute finding. 2. Levoscoliosis and advanced spinal degeneration as described. Electronically Signed   By: Neva Seat.D.  On: 09/28/2016 15:06   Ct Renal Stone Study  Result Date: 09/28/2016 CLINICAL DATA:  Left flank pain. Patient thinks she may have pulled a muscle. EXAM: CT ABDOMEN AND PELVIS WITHOUT CONTRAST TECHNIQUE: Multidetector CT imaging of the abdomen and pelvis was performed following the standard protocol without IV contrast. COMPARISON:  None. FINDINGS: Lower chest:  No contributory findings. Hepatobiliary: 18 mm subcapsular cyst in the right liver.Cholelithiasis. No evidence of cholecystitis. Pancreas: Unremarkable. Spleen: Unremarkable. Adrenals/Urinary Tract: Negative adrenals. No hydronephrosis or stone. 7 mm high-density area in the interpolar right kidney, usually a hemorrhagic cyst. Subcentimeter low-density in the upper pole right kidney is statistically a cyst. Unremarkable bladder. Stomach/Bowel: No obstruction or inflammation. Formed stool throughout most of the colon Vascular/Lymphatic: Extensive atherosclerotic calcification. No adenopathy. Reproductive:Hysterectomy.  Negative adnexae. Other: Discrete fatty mass in the right retroperitoneum, displacing bowel medially and extending from the iliac fossa to the level of the right lower liver. There are scattered internal calcifications. No internal solid components are noted. Mass measures approximately 20 cm in length and up to 4 x 10 cm in transaxial dimension. Fatty right inguinal hernia. Musculoskeletal: Remote bilateral obturator ring and right sacral ala fractures. Lumbar spine reformats described separately. No acute osseous finding. IMPRESSION: 1. No acute finding to explain left flank pain. No hydronephrosis or urolithiasis. 2. Large right retroperitoneal fatty mass extending from the pelvis to the diaphragm. This may reflect a low grade  liposarcoma; no measurable soft tissue components. 3. Cholelithiasis. 4. Moderate stool volume. Electronically Signed   By: Monte Fantasia M.D.   On: 09/28/2016 14:59    ASSESSMENT & PLAN:   83 year old female with multiple medical comorbidities as noted above with  #1 Newly diagnosed right retroperitoneal large mass. On CT appears to be suggestive of a fatty mass possibly a large liposarcoma with could potentially be a different type of tumor including other types of soft tissue sarcomas or less likely a lymphoma.  #2 uncontrolled neoplasm related pain not controlled with oral narcotics.  #3 severe constipation due to narcotics no bowel movement for 5-6 days.  #4 chronic low back pain due to levoscoliosis and advanced degenerative disc disease.  #5 left lower chest wall pain unclear etiology rule out bony metastases.  Plan -I discussed the imaging studies and diagnostic possibilities and appropriate course of action with the patient, her son and granddaughter. -She was very uncomfortable and received 4 mg of subcutaneous morphine in the clinic. -I discussed her case with the hospitalist Dr. Sloan Leiter and she will be admitted to the oncology floor as a direct admit for pain control for cancer pain. -Would recommend low-dose bolus only Dilaudid PCA. -Senna S and MiraLAX for constipation and bowel prophylaxis. -Would hold Xarelto for 48 hours and try to get a CT-guided biopsy of her right peritoenal mass for tissue diagnosis. -CT chest abdomen pelvis preferably with contrast of renal function stable to stage the patient's tumor and evaluate her left chest wall pain for possible bony metastases. -If she has very painful bony metastases might need to consider palliative radiation therapy. -Further management as per tissue diagnosis. -We will continue to follow while the patient is in the hospital and she'll be available for any additional questions.  The plan of care was discussed in detail  with the patient her son and her granddaughter and they're in agreement with this.   All of the patients questions were answered with apparent satisfaction. The patient knows to call the clinic with any problems, questions or  concerns.  I spent 60 minutes counseling the patient face to face. The total time spent in the appointment was 80 minutes and more than 50% was on counseling and direct patient cares and and coordinating care with the inpatient hospitalist team and admitting the patient.    Sullivan Lone MD [place] AAHIVMS [facility] Hematology/Oncology Physician [facility]  (Office):       [phone] (Work cell):  [phone] (Fax):           [phone]  10/07/2016 3:19 PM

## 2016-10-08 ENCOUNTER — Inpatient Hospital Stay (HOSPITAL_COMMUNITY): Payer: Medicare Other

## 2016-10-08 ENCOUNTER — Telehealth: Payer: Self-pay | Admitting: Hematology

## 2016-10-08 DIAGNOSIS — R0781 Pleurodynia: Secondary | ICD-10-CM

## 2016-10-08 DIAGNOSIS — I10 Essential (primary) hypertension: Secondary | ICD-10-CM

## 2016-10-08 DIAGNOSIS — R1900 Intra-abdominal and pelvic swelling, mass and lump, unspecified site: Secondary | ICD-10-CM

## 2016-10-08 LAB — APTT
aPTT: 60 seconds — ABNORMAL HIGH (ref 24–36)
aPTT: 65 seconds — ABNORMAL HIGH (ref 24–36)

## 2016-10-08 LAB — GLUCOSE, CAPILLARY
Glucose-Capillary: 191 mg/dL — ABNORMAL HIGH (ref 65–99)
Glucose-Capillary: 224 mg/dL — ABNORMAL HIGH (ref 65–99)
Glucose-Capillary: 242 mg/dL — ABNORMAL HIGH (ref 65–99)
Glucose-Capillary: 268 mg/dL — ABNORMAL HIGH (ref 65–99)

## 2016-10-08 LAB — URINALYSIS, ROUTINE W REFLEX MICROSCOPIC
Bacteria, UA: NONE SEEN
Bilirubin Urine: NEGATIVE
Glucose, UA: >=500 mg/dL — AB
Ketones, ur: 5 mg/dL — AB
Leukocytes, UA: NEGATIVE
Nitrite: NEGATIVE
Protein, ur: NEGATIVE mg/dL
Specific Gravity, Urine: 1.002 — ABNORMAL LOW (ref 1.005–1.030)
pH: 7 (ref 5.0–8.0)

## 2016-10-08 LAB — CBC
HCT: 41.5 % (ref 36.0–46.0)
Hemoglobin: 14.5 g/dL (ref 12.0–15.0)
MCH: 30.7 pg (ref 26.0–34.0)
MCHC: 34.9 g/dL (ref 30.0–36.0)
MCV: 87.7 fL (ref 78.0–100.0)
Platelets: 268 10*3/uL (ref 150–400)
RBC: 4.73 MIL/uL (ref 3.87–5.11)
RDW: 13.3 % (ref 11.5–15.5)
WBC: 8.6 10*3/uL (ref 4.0–10.5)

## 2016-10-08 LAB — HEPARIN LEVEL (UNFRACTIONATED)
Heparin Unfractionated: 0.49 IU/mL (ref 0.30–0.70)
Heparin Unfractionated: 0.59 IU/mL (ref 0.30–0.70)

## 2016-10-08 MED ORDER — NALOXONE HCL 0.4 MG/ML IJ SOLN
INTRAMUSCULAR | Status: AC
  Filled 2016-10-08: qty 1

## 2016-10-08 MED ORDER — FENTANYL CITRATE (PF) 100 MCG/2ML IJ SOLN
INTRAMUSCULAR | Status: AC
  Filled 2016-10-08: qty 4

## 2016-10-08 MED ORDER — MIDAZOLAM HCL 2 MG/2ML IJ SOLN
INTRAMUSCULAR | Status: AC
  Filled 2016-10-08: qty 6

## 2016-10-08 MED ORDER — MIDAZOLAM HCL 2 MG/2ML IJ SOLN
INTRAMUSCULAR | Status: AC
  Filled 2016-10-08: qty 4

## 2016-10-08 MED ORDER — FENTANYL CITRATE (PF) 100 MCG/2ML IJ SOLN
12.5000 ug | Freq: Once | INTRAMUSCULAR | Status: AC
  Administered 2016-10-08: 12.5 ug via INTRAVENOUS
  Filled 2016-10-08: qty 2

## 2016-10-08 MED ORDER — HEPARIN (PORCINE) IN NACL 100-0.45 UNIT/ML-% IJ SOLN
900.0000 [IU]/h | INTRAMUSCULAR | Status: DC
  Filled 2016-10-08: qty 250

## 2016-10-08 MED ORDER — FLUMAZENIL 0.5 MG/5ML IV SOLN
INTRAVENOUS | Status: AC
  Filled 2016-10-08: qty 5

## 2016-10-08 MED ORDER — BISACODYL 10 MG RE SUPP
10.0000 mg | Freq: Once | RECTAL | Status: AC
  Administered 2016-10-08: 10 mg via RECTAL
  Filled 2016-10-08: qty 1

## 2016-10-08 MED ORDER — FENTANYL CITRATE (PF) 100 MCG/2ML IJ SOLN
INTRAMUSCULAR | Status: AC | PRN
  Administered 2016-10-08 (×2): 25 ug via INTRAVENOUS

## 2016-10-08 MED ORDER — HEPARIN (PORCINE) IN NACL 100-0.45 UNIT/ML-% IJ SOLN
900.0000 [IU]/h | INTRAMUSCULAR | Status: DC
  Administered 2016-10-09: 900 [IU]/h via INTRAVENOUS
  Filled 2016-10-08: qty 250

## 2016-10-08 MED ORDER — MIDAZOLAM HCL 2 MG/2ML IJ SOLN
INTRAMUSCULAR | Status: AC | PRN
  Administered 2016-10-08 (×2): 1 mg via INTRAVENOUS

## 2016-10-08 NOTE — Progress Notes (Signed)
Inpatient Diabetes Program Recommendations  AACE/ADA: New Consensus Statement on Inpatient Glycemic Control (2015)  Target Ranges:  Prepandial:   less than 140 mg/dL      Peak postprandial:   less than 180 mg/dL (1-2 hours)      Critically ill patients:  140 - 180 mg/dL   Lab Results  Component Value Date   GLUCAP 191 (H) 10/08/2016   HGBA1C 6.7 03/23/2010    Review of Glycemic Control  Diabetes history: DM2 Outpatient Diabetes medications: metformin 1000 mg QPM Current orders for Inpatient glycemic control: Novolog 0-9 units tidwc and hs  Inpatient Diabetes Program Recommendations:    Change Novolog to 0-9 units Q4H while NPO Check HgbA1C to assess glycemic control PTA  Will continue to follow.  Thank you. Lorenda Peck, RD, LDN, CDE Inpatient Diabetes Coordinator [phone]

## 2016-10-08 NOTE — Progress Notes (Signed)
Pt stated that IV morphine makes her dizzy and does not help with abdominal pain. Pt provided with heat packs to apply to abdomen. Notified on-call hospitalist Dr. Earlean Polka. Received one time order of fentanyl IV 12.5 mcg.

## 2016-10-08 NOTE — Consult Note (Signed)
Chief Complaint: Patient was seen in consultation today for CT-guided right retroperitoneal mass biopsy  Referring Physician(s): Lyman Speller  Supervising Physician: Jacqulynn Cadet  Patient Status: [facility] - In-pt  History of Present Illness: Tara Bradley is a 83 y.o. female with past medical history significant for hypertension, diabetes, paroxysmal atrial fibrillation prev on Coumadin, now xarelto, arthritis, remote history of breast cancer who was recently admitted with persistent right flank/low back pain, diminished appetite, weight loss, constipation and recent imaging which revealed a large right retroperitoneal fatty mass extending from the pelvis to the diaphragm, possibly reflecting low-grade liposarcoma. Patient has also been complaining of left lower lateral chest wall discomfort. Request now received from primary care team for CT-guided biopsy of the right retroperitoneal mass.  Past Medical History:  Diagnosis Date  . Aortic valve sclerosis    Echo, 2008  . Arthritis   . Atrial fibrillation (HCC)    Paroxysmal, infrequent, Coumadin  . Bradycardia    October, 2012  . Cancer ([place])   . Chest pain    Nuclear, April, 2008, no ischemia,  . Chronic insomnia    situational stress  . Closed fracture of unspecified part of femur 2005  . Diabetes mellitus, type 2 ([place])   . Diverticulitis   . Diverticulosis   . Ejection fraction    EF 60%, echo, February, 2008  //   EF 65-70%, echo, November, 2012  . Hyperlipidemia   . Hypertension   . Long term (current) use of anticoagulants   . Osteoporosis    femur fracture 2005, pelvic fracture 2006  . Personal history of malignant neoplasm of breast   . Syncope   . Unspecified closed fracture of pelvis 2006    Past Surgical History:  Procedure Laterality Date  . CARDIOVERSION N/A 08/09/2012   Procedure: CARDIOVERSION;  Surgeon: Deboraha Sprang, MD;  Location: [place];  Service: Cardiovascular;  Laterality: N/A;  . EYE  SURGERY    . FEMUR SURGERY  2005   ORIF  . MASTECTOMY  1995   left  . SHOULDER SURGERY     left  . [place], 2003  . TONSILLECTOMY    . TOTAL ABDOMINAL HYSTERECTOMY W/ BILATERAL SALPINGOOPHORECTOMY  1995  . WRIST SURGERY      x 2     Allergies: Ambien [zolpidem tartrate]; Codeine; and Codeine sulfate  Medications: Prior to Admission medications   Medication Sig Start Date End Date Taking? Authorizing Provider  Ascorbic Acid (VITAMIN C PO) Take 1 tablet by mouth daily.   Yes [provider]  B Complex-C (B-COMPLEX WITH VITAMIN C) tablet Take 1 tablet by mouth daily.   Yes [provider]  Calcium Carbonate-Vitamin D (CALTRATE 600+D) 600-400 MG-UNIT per tablet Take 1 tablet by mouth daily.     Yes [provider]  CARTIA XT 300 MG 24 hr capsule TAKE 1 CAPSULE (300 MG TOTAL) BY MOUTH DAILY. 08/30/16  Yes [name], MD  Cholecalciferol (VITAMIN D) 2000 UNITS tablet Take 1,000 Units by mouth daily.    Yes [provider]  digoxin (DIGOX) 0.125 MG tablet Take 1 tablet (125 mcg total) by mouth daily. 03/15/16  Yes [name], MD  losartan-hydrochlorothiazide (HYZAAR) 100-12.5 MG per tablet Take 1 tablet by mouth daily. 05/29/13  Yes Ricard Dillon, MD  Magnesium 250 MG TABS Take 1 tablet by mouth daily.     Yes [provider]  metFORMIN (GLUMETZA) 500 MG (MOD) 24 hr tablet Take 1,000  mg by mouth every evening.   Yes [provider]  metoprolol tartrate (LOPRESSOR) 25 MG tablet Take 1 tablet (25 mg total) by mouth 2 (two) times daily. 01/29/16  Yes Rollene Rotunda, MD  potassium chloride (K-DUR) 10 MEQ tablet  03/25/15  Yes [provider]  potassium chloride (KLOR-CON) 8 MEQ tablet Take 1 tablet (8 mEq total) by mouth daily. 05/29/13 10/07/16 Yes Stacie Glaze, MD  Rivaroxaban (XARELTO) 15 MG TABS tablet Take 1 tablet (15 mg total) by mouth daily with supper. 04/28/16  Yes Rollene Rotunda, MD  temazepam  (RESTORIL) 15 MG capsule TAKE ONE CAPSULE AT BEDTIME AS NEEDED 03/31/15  Yes [provider]     Family History  Problem Relation Age of Onset  . Congestive Heart Failure Mother 41  . Heart attack Father 74  . Diabetes type II Father   . Diabetes type II Brother        1/2  . Multiple myeloma Brother        2/2  . Diabetes type II Son   . Colon cancer Neg Hx     Social History   Social History  . Marital status: Married    Spouse name: N/A  . Number of children: 2  . Years of education: N/A   Occupational History  . Piano Teacher    Social History Main Topics  . Smoking status: Never Smoker  . Smokeless tobacco: Never Used  . Alcohol use No  . Drug use: No  . Sexual activity: Not Currently   Other Topics Concern  . None   Social History Narrative   Husband has advancing Dementia, which has been very difficult for her.     Review of Systems see above; currently denies fever, headache, substernal chest pain, dyspnea, cough, nausea, vomiting or abnormal bleeding.  Vital Signs: BP 138/88 (BP Location: Right Arm)   Pulse (!) 101   Temp 97.9 F (36.6 C) (Oral)   Resp 16   SpO2 95%   Physical Exam awake, alert. Chest with clear breath sounds bilaterally. Palpable tenderness left lower chest wall; heart with irregular and tachycardic rhythm; abdomen soft, positive bowel sounds, not significantly tender. Lower extremities with no significant edema.  Mallampati Score:     Imaging: Ct L-spine No Charge  Result Date: 09/28/2016 CLINICAL DATA:  Left flank pain. Patient has to lift demented has been at home and think she has pulled muscle. No extremity radiation EXAM: CT LUMBAR SPINE WITHOUT CONTRAST TECHNIQUE: Lumbar spine reformats of abdominal CT were generated as requested. COMPARISON: COMPARISON:  None. FINDINGS: Segmentation: There are 5 lumbar type vertebral bodies, but the right L5 transverse processes is non segmented from the right sacral ala.  Alignment: Prominent scoliosis. Grade 1 anterolisthesis at L4-5, facet mediated. Mild retrolisthesis at L1-2. Vertebrae: No evidence of acute fracture. No endplate erosion or evidence of focal bone lesion. Remote right sacral ala fracture. Paraspinal and other soft tissues: Described on separate abdominal CT Disc levels: T12- L1: Unremarkable. L1-L2: Advanced degenerative disc narrowing, worse towards the right. Mild retrolisthesis, greater on the right, with advanced right foraminal stenosis. Patent spinal canal L2-L3: Asymmetric degenerative disc narrowing, advanced on the right where there is far-lateral endplate spurring and disc bulging. Mild facet degenerative spurring. No evidence of impingement L3-L4: Advanced disc narrowing but only minimal annulus bulging. Facet spurring. No evidence of impingement L4-L5: Severe facet arthropathy with grade 1 anterolisthesis, joint distortion, and spurring. The disc is narrowed and mildly bulging. Bilateral  recess narrowing. Mild borderline moderate bilateral foraminal narrowing. L5-S1:No degenerative changes due to partial non segmentation. IMPRESSION: 1. No acute finding. 2. Levoscoliosis and advanced spinal degeneration as described. Electronically Signed   By: Monte Fantasia M.D.   On: 09/28/2016 15:06   Ct Renal Stone Study  Result Date: 09/28/2016 CLINICAL DATA:  Left flank pain. Patient thinks she may have pulled a muscle. EXAM: CT ABDOMEN AND PELVIS WITHOUT CONTRAST TECHNIQUE: Multidetector CT imaging of the abdomen and pelvis was performed following the standard protocol without IV contrast. COMPARISON:  None. FINDINGS: Lower chest:  No contributory findings. Hepatobiliary: 18 mm subcapsular cyst in the right liver.Cholelithiasis. No evidence of cholecystitis. Pancreas: Unremarkable. Spleen: Unremarkable. Adrenals/Urinary Tract: Negative adrenals. No hydronephrosis or stone. 7 mm high-density area in the interpolar right kidney, usually a hemorrhagic cyst.  Subcentimeter low-density in the upper pole right kidney is statistically a cyst. Unremarkable bladder. Stomach/Bowel: No obstruction or inflammation. Formed stool throughout most of the colon Vascular/Lymphatic: Extensive atherosclerotic calcification. No adenopathy. Reproductive:Hysterectomy.  Negative adnexae. Other: Discrete fatty mass in the right retroperitoneum, displacing bowel medially and extending from the iliac fossa to the level of the right lower liver. There are scattered internal calcifications. No internal solid components are noted. Mass measures approximately 20 cm in length and up to 4 x 10 cm in transaxial dimension. Fatty right inguinal hernia. Musculoskeletal: Remote bilateral obturator ring and right sacral ala fractures. Lumbar spine reformats described separately. No acute osseous finding. IMPRESSION: 1. No acute finding to explain left flank pain. No hydronephrosis or urolithiasis. 2. Large right retroperitoneal fatty mass extending from the pelvis to the diaphragm. 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 3. Cholelithiasis. 4. Moderate stool volume. Electronically Signed   By: Monte Fantasia M.D.   On: 09/28/2016 14:59    Labs:  CBC:  Recent Labs  12/31/15 1504 10/07/16 1845 10/08/16 0404  WBC 5.4 10.1 8.6  HGB 13.6 14.7 14.5  HCT 41.6 41.8 41.5  PLT 203 255 268    COAGS:  Recent Labs  10/07/16 2012 10/08/16 0404  INR 1.17  --   APTT 38* 60*    BMP:  Recent Labs  12/31/15 1504 10/07/16 1845  NA 139 129*  K 4.4 4.0  CL 99 92*  CO2 25 26  GLUCOSE 214* 241*  BUN 21 11  CALCIUM 9.8 9.1  CREATININE 0.71 0.53  GFRNONAA  --  >60  GFRAA  --  >60    LIVER FUNCTION TESTS:  Recent Labs  10/07/16 1845  BILITOT 1.0  AST 16  ALT 8*  ALKPHOS 132*  PROT 6.7  ALBUMIN 3.5    TUMOR MARKERS: No results for input(s): AFPTM, CEA, CA199, CHROMGRNA in the last 8760 hours.  Assessment and Plan: 83 y.o. 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 Patient has also been complaining of left lower lateral chest wall discomfort. Request now received from primary care team for CT-guided biopsy of the right retroperitoneal mass. Imaging studies have been reviewed by Dr. Laurence Ferrari.Risks and benefits discussed with the patient including, but not limited to bleeding, infection, damage to adjacent structures or low yield requiring additional tests. All  of the patient's questions were answered, patient is agreeable to proceed. Consent signed and in chart. Patient's heart rate currently 156. EKG obtained and will relay findings to hospitalist. Patient last ate at 8 am today. If stable will plan for biopsy later this afternoon. Will need to stop IV heparin one hour prior to procedure.     Thank you for this interesting consult.  I greatly enjoyed meeting Tara Bradley and look forward to participating in their care.  A copy of this report was sent to the requesting provider on this date.  Electronically Signed: D. Rowe Robert, PA-C 10/08/2016, 10:21 AM   I spent a total of 25 minutes  in face to face in clinical consultation, greater than 50% of which was counseling/coordinating care for CT-guided right retroperitoneal mass biopsy

## 2016-10-08 NOTE — Telephone Encounter (Signed)
NO LOS 5/17

## 2016-10-08 NOTE — Progress Notes (Signed)
Initial Nutrition Assessment  DOCUMENTATION CODES:   Not applicable  INTERVENTION:    Advance diet as medically appropriate.  RD to add interventions accordingly.  NUTRITION DIAGNOSIS:   Increased nutrient needs related to catabolic illness as evidenced by estimated needs  GOAL:   Patient will meet greater than or equal to 90% of their needs  MONITOR:   Diet advancement, PO intake, Labs, Weight trends, I & O's  REASON FOR ASSESSMENT:   Malnutrition Screening Tool  ASSESSMENT:   83 y.o. Female with PMH significant for hypertension, diabetes who was recently admitted with persistent right flank/low back pain, diminished appetite, weight loss, constipation.   Pt recent imaging revealed retroperitoneal fatty mass extending from the pelvis to diaphragm. Plan is for CT-guided biopsy of mass.  RD unable to obtain nutrition hx at this time.  Labs being drawn. Per Malnutrition Screening Tool Report, pt with a decreased appetite x 1 week PTA. Per Oncology note, pt reported her appetite has decreased about 30% and she might have lost about 8-10 lbs over the last few months. Labs and medications reviewed. CBG's [phone].  Unable to complete Nutrition-Focused physical exam at this time.   Diet Order:  Diet NPO time specified Except for: Sips with Meds  Skin:  Reviewed, no issues  Last BM:  5/11  Height:   Ht Readings from Last 1 Encounters:  10/08/16 5\' 4"  (1.626 m)   Weight:   Wt Readings from Last 1 Encounters:  10/08/16 135 lb (61.2 kg)   Ideal Body Weight:  54.5 kg  BMI:  Body mass index is 23.17 kg/m.  Estimated Nutritional Needs:   Kcal:  1600-1800  Protein:  80-90 gm  Fluid:  1.6-1.8 L  EDUCATION NEEDS:   No education needs identified at this time  Arthur Holms, RD, LDN Pager #: [phone] After-Hours Pager #: [phone]

## 2016-10-08 NOTE — Progress Notes (Signed)
PROGRESS NOTE    Tara Bradley  NID:[id number] DOB: 01-25-1923 DOA: 10/07/2016 PCP: [name], MD   No chief complaint on file.   Brief Narrative:  HPI on 10/07/2016 by Dr. Gevena Barre Tara Bradley is a 83 y.o. female with medical history significant of hypertension and atrial fibrillation on Xarelto who is actually quite physically active and functional. Starting approximately a week and a half ago she started having severe back pain which initially she felt started on her right side initially but then went over to her left side and stayed there. She described the pain as a 10+/10 and although moved from one side to the other, it did not radiate elsewhere. She presented to the emergency room on 5/8. She underwent a CT renal study looking for kidney stones but there were no findings to explain her left flank pain. No hydronephrosis or urolithiasis. 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  This was discussed with the patient and plans were made for outpatient follow-up. At that time she was given some oral pain medications.  Since then she has continued to have pain and now feels like it is more anterior. The pain is described as continuous present all the time as a dull heavy ache at times spasms into a sharp pain. She does not really think that anything she does makes it better or worse other than if she rolls over and lays on her left side that does seem to give her some relief. Not made better or worse by inspiration or urination. Patient states that the last time she moved her bowels was approximately 7 days ago. Patient had appointment with oncology today for evaluation of this right retroperitoneal mass but she was in such pain that she was given IV morphine which did little for it. Oncology notify the hospitalist and arrangements were made for  patient to come in for pain control, evaluation for pain as well as biopsy of retroperitoneal mass.  Assessment & Plan   Left lateral pain/intractable pain -located around patient's ribs, likely musculoskeletal pain -Patient relays that she helps lift her husband (as he has dementia). Pain is worse with deep inhalation and movement -continue IV pain control as oxycodone and hydrocodone have not controlled her pain -Doubt this is related to pelvic mass -CXR ordered -Of note, patient has chronic lower back pain with levoscolosis and advanced degenerative disc disease -Pending results of CXR and pain, may obtain CT chest/abd/pelvis   Right Retroperitoneal fatty mass -Noted on CT renal stone study from 09/28/2016 -Mass extends from pelvis to the diaphragm, may reflect low-grade liposarcoma -Interventional radiology consulted for biopsy -Xarelto held, placed on heparin  Diabetes mellitus, type II -Metformin held -Continue insulin signs: CBG monitoring  Atrial fibrillation -CHADSVASC 5 (given age, gender, DM, HTN) -As above, Xarelto held, continue heparin -Continue digoxin, diltiazem, metoprolol  Essential hypertension -Continue losartan/HCTZ, metoprolol  Constipation due to opioid therapy -Noted to have moderate stool volume on CT scan -Will order suppository -Continue miralax, colace  DVT Prophylaxis  Heparin  Code Status: DNR  Family Communication: None at bedside  Disposition Plan: Admitted. Continue pain control and workup for lateral pain. Pending biopsy today.  Consultants Interventional radiology  Procedures  None  Antibiotics   Anti-infectives    None      Subjective:   Tara Bradley seen and examined today.  Continues to have left sided pain, feels it is in her ribs. Worsens with movement and deep inhalation. Denies chest pain, abdominal pain, nausea, vomiting, diarrhea. Does complain of constipation. Has not been able to have a bowel movement in over a  week.  Objective:   Vitals:   10/07/16 2149 10/07/16 2313 10/08/16 0456 10/08/16 1322  BP: (!) 148/98 138/78 138/88   Pulse: 96  (!) 101   Resp: 18  16   Temp: 98 F (36.7 C)  97.9 F (36.6 C)   TempSrc: Oral  Oral   SpO2: 95%  95%   Weight:    61.2 kg (135 lb)  Height:    5\' 4"  (1.626 m)    Intake/Output Summary (Last 24 hours) at 10/08/16 1422 Last data filed at 10/08/16 0600  Gross per 24 hour  Intake           399.88 ml  Output              300 ml  Net            99.88 ml   Filed Weights   10/08/16 1322  Weight: 61.2 kg (135 lb)    Exam  General: Well developed, well nourished, NAD, appears stated age  HEENT: NCAT, mucous membranes moist.   Cardiovascular: S1 S2 auscultated, irregularly irregular   Respiratory: Clear to auscultation bilaterally with equal chest rise  Abdomen: Soft, nontender, nondistended, + bowel sounds  Extremities: warm dry without cyanosis clubbing or edema  Musculoskeletal: pain reproduced with palpation of left lateral rib cage.  Neuro: AAOx3, nonfocal  Psych: Normal affect and demeanor with intact judgement and insight   Data Reviewed: I have personally reviewed following labs and imaging studies  CBC:  Recent Labs Lab 10/07/16 1845 10/08/16 0404  WBC 10.1 8.6  HGB 14.7 14.5  HCT 41.8 41.5  MCV 88.6 87.7  PLT 255 938   Basic Metabolic Panel:  Recent Labs Lab 10/07/16 1845  NA 129*  K 4.0  CL 92*  CO2 26  GLUCOSE 241*  BUN 11  CREATININE 0.53  CALCIUM 9.1   GFR: Estimated Creatinine Clearance: 38.7 mL/min (by C-G formula based on SCr of 0.53 mg/dL). Liver Function Tests:  Recent Labs Lab 10/07/16 1845  AST 16  ALT 8*  ALKPHOS 132*  BILITOT 1.0  PROT 6.7  ALBUMIN 3.5   No results for input(s): LIPASE, AMYLASE in the last 168 hours. No results for input(s): AMMONIA in the last 168 hours. Coagulation Profile:  Recent Labs Lab 10/07/16 2012  INR 1.17   Cardiac Enzymes: No results for  input(s): CKTOTAL, CKMB, CKMBINDEX, TROPONINI in the last 168 hours. BNP (last 3 results) No results for input(s): PROBNP in the last 8760 hours. HbA1C: No results for input(s): HGBA1C in the last 72 hours. CBG:  Recent Labs Lab 10/07/16 1853 10/07/16 2136 10/08/16 0720 10/08/16 1246  GLUCAP 238* 308* 191* 224*   Lipid Profile: No results for input(s): CHOL, HDL, LDLCALC, TRIG, CHOLHDL, LDLDIRECT in the last 72 hours. Thyroid Function Tests: No results for input(s): TSH, T4TOTAL, FREET4, T3FREE, THYROIDAB in the last 72 hours. Anemia Panel: No results for input(s): VITAMINB12, FOLATE, FERRITIN, TIBC, IRON, RETICCTPCT in the last 72 hours. Urine analysis:    Component Value Date/Time   COLORURINE STRAW (A) 10/08/2016 0454   APPEARANCEUR CLEAR 10/08/2016 0454   LABSPEC 1.002 (L) 10/08/2016 0454   PHURINE 7.0 10/08/2016 0454   GLUCOSEU >=500 (A) 10/08/2016 0454   HGBUR  SMALL (A) 10/08/2016 0454   BILIRUBINUR NEGATIVE 10/08/2016 0454   BILIRUBINUR n 07/23/2013 1343   KETONESUR 5 (A) 10/08/2016 0454   PROTEINUR NEGATIVE 10/08/2016 0454   UROBILINOGEN 0.2 07/23/2013 1343   UROBILINOGEN 0.2 03/07/2012 2123   NITRITE NEGATIVE 10/08/2016 0454   LEUKOCYTESUR NEGATIVE 10/08/2016 0454   Sepsis Labs: @LABRCNTIP (procalcitonin:4,lacticidven:4)  )No results found for this or any previous visit (from the past 240 hour(s)).    Radiology Studies: No results found.   Scheduled Meds: . digoxin  125 mcg Oral Daily  . diltiazem  300 mg Oral Daily  . docusate sodium  100 mg Oral BID  . losartan  100 mg Oral Daily   And  . hydrochlorothiazide  12.5 mg Oral Daily  . insulin aspart  0-5 Units Subcutaneous QHS  . insulin aspart  0-9 Units Subcutaneous TID WC  . Magnesium  200 mg Oral Daily  . metoprolol tartrate  25 mg Oral BID  . polyethylene glycol  17 g Oral Daily  . sodium chloride flush  3 mL Intravenous Q12H  . temazepam  15 mg Oral QHS   Continuous Infusions: . heparin 900  Units/hr (10/08/16 0534)     LOS: 1 day   Time Spent in minutes   30 minutes  Victorino Fatzinger D.O. on 10/08/2016 at 2:22 PM  Between 7am to 7pm - Pager - [phone]  After 7pm go to [email or web address] - password [email or web address]  And look for the night coverage person covering for me after hours  [facility] Office  [phone]

## 2016-10-08 NOTE — Progress Notes (Addendum)
ANTICOAGULATION CONSULT NOTE - Follow Up Consult  Pharmacy Consult for Heparin Indication: atrial fibrillation, Xarelto held for biopsy  Allergies  Allergen Reactions  . Ambien [Zolpidem Tartrate]     Hallucinations   . Codeine Other (See Comments)    headache  . Codeine Sulfate     REACTION: unspecified    Patient Measurements: Height: 5\' 4"  (162.6 cm) Weight: 135 lb (61.2 kg) IBW/kg (Calculated) : 54.7 Heparin Dosing Weight: actual weight, 61.2 kg  Vital Signs: Temp: 97.9 F (36.6 C) (05/18 0456) Temp Source: Oral (05/18 0456) BP: 138/88 (05/18 0456) Pulse Rate: 101 (05/18 0456)  Labs:  Recent Labs  10/07/16 1845 10/07/16 2012 10/08/16 0404  HGB 14.7  --  14.5  HCT 41.8  --  41.5  PLT 255  --  268  APTT  --  38* 60*  LABPROT  --  15.0  --   INR  --  1.17  --   HEPARINUNFRC  --  0.89* 0.49  CREATININE 0.53  --   --     Estimated Creatinine Clearance: 38.7 mL/min (by C-G formula based on SCr of 0.53 mg/dL).   Medications:  Infusions:  . heparin 900 Units/hr (10/08/16 0534)    Assessment: 83 y.o.femalewith medical history significant of hypertension and atrial fibrillation on Xarelto who is actually quite physically active and functional. Started having back pain ~ 1week ago.  Oncology notify the hospitalist and arrangements were made for patient to come in for pain control, evaluation for pain as well as biopsy of retroperitoneal mass.Pharmacy to dose heparin.  Last dose of xarelto 5/16 at [facility].  Today, 10/08/2016: Heparin level 0.59, therapeutic APTT 65, therapeutic CBC: Hgb and Plt stable/WNL SCr 0.53   Goal of Therapy:  Heparin level 0.3-0.7 units/ml aPTT 66-102 seconds Monitor platelets by anticoagulation protocol: Yes   Plan:   Continue heparin IV infusion at 900 units/hr  Recheck heparin level, APTT in 8 hours - If HL and APTT continue to correlate, can stop checking APTT and titrate using HL alone.  Daily heparin level and  CBC  Continue to monitor H&H and platelets  Follow up biopsy timing on 5/18 (RN to stop heparin 1 hour prior to procedure) and post-procedure anticoagulation plans.   Gretta Arab PharmD, BCPS Pager [phone] 10/08/2016 1:50 PM

## 2016-10-08 NOTE — Procedures (Signed)
Interventional Radiology Procedure Note  Procedure: CT guided bx of fatty mass in the right retroperitoneum  Complications: None  Estimated Blood Loss: None  Recommendations: - Path pending - Bedrest x 1 hr  Signed,  Criselda Peaches, MD

## 2016-10-08 NOTE — Progress Notes (Signed)
ANTICOAGULATION CONSULT NOTE - Follow Up Consult  Pharmacy Consult for Heparin Indication: atrial fibrillation, Xarelto held for biopsy  Allergies  Allergen Reactions  . Ambien [Zolpidem Tartrate]     Hallucinations   . Codeine Other (See Comments)    headache  . Codeine Sulfate     REACTION: unspecified    Patient Measurements: Height: 5\' 4"  (162.6 cm) Weight: 135 lb (61.2 kg) IBW/kg (Calculated) : 54.7 Heparin Dosing Weight: actual weight, 61.2 kg  Vital Signs: Temp: 98.3 F (36.8 C) (05/18 1352) Temp Source: Oral (05/18 1352) BP: 126/99 (05/18 1615) Pulse Rate: 115 (05/18 1615)  Labs:  Recent Labs  10/07/16 1845 10/07/16 2012 10/08/16 0404 10/08/16 1318  HGB 14.7  --  14.5  --   HCT 41.8  --  41.5  --   PLT 255  --  268  --   APTT  --  38* 60* 65*  LABPROT  --  15.0  --   --   INR  --  1.17  --   --   HEPARINUNFRC  --  0.89* 0.49 0.59  CREATININE 0.53  --   --   --     Estimated Creatinine Clearance: 38.7 mL/min (by C-G formula based on SCr of 0.53 mg/dL).   Medications:  Infusions:  . heparin Stopped (10/08/16 1430)    Assessment: 83 y.o.femalewith medical history significant of hypertension and atrial fibrillation on Xarelto who is actually quite physically active and functional. Started having back pain ~ 1week ago.  Oncology notify the hospitalist and arrangements were made for patient to come in for pain control, evaluation for pain as well as biopsy of retroperitoneal mass.Pharmacy to dose heparin.  Last dose of xarelto 5/16 at [facility].  Today, 10/08/2016: Heparin level 0.59, therapeutic APTT 65, therapeutic CBC: Hgb and Plt stable/WNL SCr 0.53 Biopsy per IR performed at ~1630, deemed low risk for bleeding per IR consult comment.  Heparin to resume 4h after procedure   Goal of Therapy:  Heparin level 0.3-0.7 units/ml aPTT 66-102 seconds Monitor platelets by anticoagulation protocol: Yes   Plan:   Resume heparin infusion at 900 units/hr  4h after procedure (at 2030)  Recheck heparin level, APTT in 8 hours - If HL and APTT continue to correlate, can stop checking APTT and titrate using HL alone.  Daily heparin level and CBC  Continue to monitor H&H and platelets  Follow-up resume xarelto 5/19 if no further procedure planned  Doreene Eland, PharmD, BCPS.   Pager: [phone] 10/08/2016 5:18 PM

## 2016-10-08 NOTE — Progress Notes (Signed)
ANTICOAGULATION CONSULT NOTE - f/u Consult  Pharmacy Consult for heparin Indication: atrial fibrillation  Allergies  Allergen Reactions  . Ambien [Zolpidem Tartrate]     Hallucinations   . Codeine Other (See Comments)    headache  . Codeine Sulfate     REACTION: unspecified    Patient Measurements:   Heparin Dosing Weight: 61.2kg  Vital Signs: Temp: 97.9 F (36.6 C) (05/18 0456) Temp Source: Oral (05/18 0456) BP: 138/88 (05/18 0456) Pulse Rate: 101 (05/18 0456)  Labs:  Recent Labs  10/07/16 1845 10/07/16 2012 10/08/16 0404  HGB 14.7  --  14.5  HCT 41.8  --  41.5  PLT 255  --  268  APTT  --  38* 60*  LABPROT  --  15.0  --   INR  --  1.17  --   HEPARINUNFRC  --  0.89* 0.49  CREATININE 0.53  --   --     Estimated Creatinine Clearance: 38.7 mL/min (by C-G formula based on SCr of 0.53 mg/dL).   Medical History: Past Medical History:  Diagnosis Date  . Aortic valve sclerosis    Echo, 2008  . Arthritis   . Atrial fibrillation (HCC)    Paroxysmal, infrequent, Coumadin  . Bradycardia    October, 2012  . Cancer ([place])   . Chest pain    Nuclear, April, 2008, no ischemia,  . Chronic insomnia    situational stress  . Closed fracture of unspecified part of femur 2005  . Diabetes mellitus, type 2 ([place])   . Diverticulitis   . Diverticulosis   . Ejection fraction    EF 60%, echo, February, 2008  //   EF 65-70%, echo, November, 2012  . Hyperlipidemia   . Hypertension   . Long term (current) use of anticoagulants   . Osteoporosis    femur fracture 2005, pelvic fracture 2006  . Personal history of malignant neoplasm of breast   . Syncope   . Unspecified closed fracture of pelvis 2006     Assessment: 83 y.o.femalewith medical history significant of hypertension and atrial fibrillation on Xarelto who is actually quite physically active and functional. Started having back pain ~ 1week ago.  Oncology notify the hospitalist and arrangements were made for  patient to come in for pain control, evaluation for pain as well as biopsy of retroperitoneal mass. Pharmacy to dose heparin.  Last dose of xarelto 5/16 at 1930 5/17 CrCl WNL H/H WNL Plts WNL aPTT, PT/INR ordered STAT Today, 5/18 0400 HL=0.49 and aptt=60 sec aptt slighlty below goal, spoke with RN>pt s/p mastectomy so can only use one arm for labs (so lab was likely drawn from IV heparin arm by LAB) Lab was also drawn a little early so only 6 hours vs 8 hours.  For these reasons will only slightly increase heparin drip.   Goal of Therapy:  Heparin level 0.3-0.7 units/ml  Monitor platelets by anticoagulation protocol  Plan:   Increase heparin drip to 900 units/hr  Recheck HL and aptt in 8 hours  Use aPTT to monitor until aPTT and HL correlate  F/u on timing of potential biopsy  Dorrene German 10/08/2016, 5:33 AM

## 2016-10-09 ENCOUNTER — Encounter (HOSPITAL_COMMUNITY): Payer: Self-pay | Admitting: Radiology

## 2016-10-09 ENCOUNTER — Inpatient Hospital Stay (HOSPITAL_COMMUNITY): Payer: Medicare Other

## 2016-10-09 LAB — CBC
HCT: 43 % (ref 36.0–46.0)
Hemoglobin: 14.7 g/dL (ref 12.0–15.0)
MCH: 30.4 pg (ref 26.0–34.0)
MCHC: 34.2 g/dL (ref 30.0–36.0)
MCV: 89 fL (ref 78.0–100.0)
Platelets: 267 10*3/uL (ref 150–400)
RBC: 4.83 MIL/uL (ref 3.87–5.11)
RDW: 13.4 % (ref 11.5–15.5)
WBC: 7.9 10*3/uL (ref 4.0–10.5)

## 2016-10-09 LAB — BASIC METABOLIC PANEL
Anion gap: 11 (ref 5–15)
BUN: 14 mg/dL (ref 6–20)
CO2: 30 mmol/L (ref 22–32)
Calcium: 9.1 mg/dL (ref 8.9–10.3)
Chloride: 93 mmol/L — ABNORMAL LOW (ref 101–111)
Creatinine, Ser: 0.59 mg/dL (ref 0.44–1.00)
GFR calc Af Amer: >60 mL/min (ref >60)
GFR calc non Af Amer: >60 mL/min (ref >60)
Glucose, Bld: 164 mg/dL — ABNORMAL HIGH (ref 65–99)
Potassium: 3.6 mmol/L (ref 3.5–5.1)
Sodium: 134 mmol/L — ABNORMAL LOW (ref 135–145)

## 2016-10-09 LAB — HEMOGLOBIN A1C
Hgb A1c MFr Bld: 8.5 % — ABNORMAL HIGH (ref 4.8–5.6)
Mean Plasma Glucose: 197 mg/dL

## 2016-10-09 LAB — HEPARIN LEVEL (UNFRACTIONATED): Heparin Unfractionated: 0.33 IU/mL (ref 0.30–0.70)

## 2016-10-09 LAB — GLUCOSE, CAPILLARY
Glucose-Capillary: 191 mg/dL — ABNORMAL HIGH (ref 65–99)
Glucose-Capillary: 227 mg/dL — ABNORMAL HIGH (ref 65–99)
Glucose-Capillary: 103 mg/dL — ABNORMAL HIGH (ref 65–99)
Glucose-Capillary: 311 mg/dL — ABNORMAL HIGH (ref 65–99)

## 2016-10-09 LAB — APTT: aPTT: 63 seconds — ABNORMAL HIGH (ref 24–36)

## 2016-10-09 MED ORDER — HEPARIN (PORCINE) IN NACL 100-0.45 UNIT/ML-% IJ SOLN
950.0000 [IU]/h | INTRAMUSCULAR | Status: AC
  Filled 2016-10-09: qty 250

## 2016-10-09 MED ORDER — HEPARIN (PORCINE) IN NACL 100-0.45 UNIT/ML-% IJ SOLN
950.0000 [IU]/h | INTRAMUSCULAR | Status: DC
  Filled 2016-10-09: qty 250

## 2016-10-09 MED ORDER — IOPAMIDOL (ISOVUE-300) INJECTION 61%
INTRAVENOUS | Status: AC
  Administered 2016-10-09: 30 mL via ORAL
  Filled 2016-10-09: qty 30

## 2016-10-09 MED ORDER — DICLOFENAC SODIUM 1 % TD GEL
2.0000 g | Freq: Four times a day (QID) | TRANSDERMAL | Status: DC
  Administered 2016-10-09 – 2016-10-10 (×3): 2 g via TOPICAL
  Filled 2016-10-09 (×2): qty 100

## 2016-10-09 MED ORDER — METHOCARBAMOL 500 MG PO TABS
500.0000 mg | ORAL_TABLET | Freq: Four times a day (QID) | ORAL | Status: DC | PRN
  Administered 2016-10-09: 500 mg via ORAL
  Filled 2016-10-09: qty 1

## 2016-10-09 MED ORDER — IOPAMIDOL (ISOVUE-300) INJECTION 61%
30.0000 mL | Freq: Once | INTRAVENOUS | Status: AC | PRN
  Administered 2016-10-09: 30 mL via ORAL

## 2016-10-09 MED ORDER — RIVAROXABAN 15 MG PO TABS
15.0000 mg | ORAL_TABLET | Freq: Every day | ORAL | Status: DC
  Administered 2016-10-09: 15 mg via ORAL
  Filled 2016-10-09 (×2): qty 1

## 2016-10-09 NOTE — Evaluation (Signed)
Physical Therapy Evaluation Patient Details Name: Tara Bradley MRN: [id number] DOB: 05-27-1922 Today's Date: 10/09/2016   History of Present Illness  Tara Bradley is a 83 y.o. female with medical history significant of hypertension, atrial fibrillation on Xarelto who is actually quite physically active and functional. Starting approximately a week and a half prior to admission she started having severe back pain which initially she felt started on her right side  but then went over to her left side and stayed there. She presented to the emergency room on 5/8 with worsening L sided pain, also recently dx'd with retroperitoneal mass (9cm), s/p CT guided bx this adm, results pending  Clinical Impression  Pt admitted with above diagnosis. Pt currently with functional limitations due to the deficits listed below (see PT Problem List). Pt will benefit from skilled PT to increase their independence and safety with mobility to allow discharge to the venue listed below.   Pt very unsteady without UE support today--> amb 180' with RW, L sided rib area pain incr with incr distance; Gait stability improved with RW Pt reports pain to be worse with certain movements, especially twisitng;  I am able to reproduce pain with L UE/shoulder girdle resistance which suggests pain may be musculature--pt does report physically  helping her husband stand repeatedly; pt reports the heat provides some relief however she feels the morphine is not helping that much and  it makes her feels off balance; pt has a significant R rib flare/shift d/t scoliosis and  in addition now has a 9cm mass on the right further shifting internal organs and this may be contributing to her pain as well, along with a past T8 compression fx; discussed with pt the possible reasons for incr pain and rest/heat/ice etc; will continue to follow; Thank you for this referral     Follow Up Recommendations Home health PT    Equipment Recommendations  Rolling walker with 5" wheels    Recommendations for Other Services       Precautions / Restrictions Precautions Precautions: Fall Precaution Comments: L anterolateral rib (area) pain with movement      Mobility  Bed Mobility Overal bed mobility: Modified Independent             General bed mobility comments: incr time, guarded, grimaces with pain but completes task without physical assist  Transfers Overall transfer level: Needs assistance Equipment used: None Transfers: Sit to/from Stand Sit to Stand: Min guard         General transfer comment: pt guarded, unsteady on initial standing and reaches for sink adn IV pole to steady self  Ambulation/Gait Ambulation/Gait assistance: Min guard Ambulation Distance (Feet): 180 Feet Assistive device: Rolling walker (2 wheeled);None Gait Pattern/deviations: Step-through pattern;Decreased stride length;Trunk flexed     General Gait Details: amb 5' without AD, very  unsteady so given RW to incr stability  Stairs            Wheelchair Mobility    Modified Rankin (Stroke Patients Only)       Balance Overall balance assessment: Needs assistance   Sitting balance-Leahy Scale: Fair Sitting balance - Comments: limited by pain     Standing balance-Leahy Scale: Poor Standing balance comment: reliant on UEs                             Pertinent Vitals/Pain Pain Assessment: 0-10 Pain Score: 3  Pain Location: L ribs/intercostals and subscapular region  Pain Descriptors / Indicators: Dull;Sore Pain Intervention(s): Monitored during session;Heat applied;Premedicated before session    Home Living Family/patient expects to be discharged to:: Private residence Living Arrangements: Spouse/significant other   Type of Home: House Home Access: Level entry     Home Layout: One [place]: None Additional Comments: pt husband goes to daycare; her husband has dementia; she has to physically help  him to standing sometimes    Prior Function Level of Independence: Independent         Comments: goes to the gym daily     Hand Dominance        Extremity/Trunk Assessment   Upper Extremity Assessment Upper Extremity Assessment: LUE deficits/detail LUE Deficits / Details: AROm grossly WFL; muscle testing incr pain    Lower Extremity Assessment Lower Extremity Assessment: Overall WFL for tasks assessed    Cervical / Trunk Assessment Cervical / Trunk Assessment: Kyphotic;Other exceptions Cervical / Trunk Exceptions: scoliosis with right rib flare  Communication   Communication: No difficulties  Cognition Arousal/Alertness: Awake/alert Behavior During Therapy: WFL for tasks assessed/performed Overall Cognitive Status: Within Functional Limits for tasks assessed                                        General Comments      Exercises     Assessment/Plan    PT Assessment Patient needs continued PT services  PT Problem List Decreased activity tolerance;Decreased balance;Decreased mobility;Pain       PT Treatment Interventions DME instruction;Gait training;Functional mobility training;Therapeutic exercise;Therapeutic activities;Patient/family education    PT Goals (Current goals can be found in the Care Plan section)  Acute Rehab PT Goals Patient Stated Goal: feel better and have less pain PT Goal Formulation: With patient Time For Goal Achievement: 10/16/16 Potential to Achieve Goals: Good    Frequency Min 3X/week   Barriers to discharge        Co-evaluation               AM-PAC PT "6 Clicks" Daily Activity  Outcome Measure Difficulty turning over in bed (including adjusting bedclothes, sheets and blankets)?: None Difficulty moving from lying on back to sitting on the side of the bed? : None Difficulty sitting down on and standing up from a chair with arms (e.g., wheelchair, bedside commode, etc,.)?: A Little Help needed moving to  and from a bed to chair (including a wheelchair)?: A Little Help needed walking in hospital room?: A Little Help needed climbing 3-5 steps with a railing? : A Little 6 Click Score: 20    End of Session Equipment Utilized During Treatment: Gait belt Activity Tolerance: Patient tolerated treatment well Patient left: with call bell/phone within reach;in bed (alarm not activated on PT arrival)   PT Visit Diagnosis: Difficulty in walking, not elsewhere classified (R26.2);Pain Pain - Right/Left: Left Pain - part of body:  (lateral ribs)    Time: 5093-2671 PT Time Calculation (min) (ACUTE ONLY): 25 min   Charges:   PT Evaluation $PT Eval Low Complexity: 1 Procedure PT Treatments $Gait Training: 8-22 mins   PT G CodesKenyon Ana, PT Pager: [phone] 10/09/2016   [facility], [facility], [facility] 10/09/2016, 4:44 PM

## 2016-10-09 NOTE — Progress Notes (Signed)
ANTICOAGULATION CONSULT NOTE - Follow Up Consult  Pharmacy Consult for Heparin Indication: atrial fibrillation, Xarelto held for biopsy  Allergies  Allergen Reactions  . Ambien [Zolpidem Tartrate]     Hallucinations   . Codeine Other (See Comments)    headache  . Codeine Sulfate     REACTION: unspecified    Patient Measurements: Height: 5\' 4"  (162.6 cm) Weight: 135 lb (61.2 kg) IBW/kg (Calculated) : 54.7 Heparin Dosing Weight: actual weight, 61.2 kg  Vital Signs: Temp: 98 F (36.7 C) (05/19 0458) Temp Source: Oral (05/19 0458) BP: 128/89 (05/19 0458) Pulse Rate: 68 (05/19 0458)  Labs:  Recent Labs  10/07/16 1845  10/07/16 2012 10/08/16 0404 10/08/16 1318 10/09/16 0443  HGB 14.7  --   --  14.5  --  14.7  HCT 41.8  --   --  41.5  --  43.0  PLT 255  --   --  268  --  267  APTT  --   < > 38* 60* 65* 63*  LABPROT  --   --  15.0  --   --   --   INR  --   --  1.17  --   --   --   HEPARINUNFRC  --   < > 0.89* 0.49 0.59 0.33  CREATININE 0.53  --   --   --   --  0.59  < > = values in this interval not displayed.  Estimated Creatinine Clearance: 38.7 mL/min (by C-G formula based on SCr of 0.59 mg/dL).   Medications:  Infusions:  . heparin 900 Units/hr (10/09/16 0317)    Assessment: 83 y.o.femalewith medical history significant of hypertension and atrial fibrillation on Xarelto who is actually quite physically active and functional. Started having back pain ~ 1week ago.  Oncology notify the hospitalist and arrangements were made for patient to come in for pain control, evaluation for pain as well as biopsy of retroperitoneal mass.Pharmacy to dose heparin.  Last dose of xarelto 5/16 at 1930.  5/18 Heparin level 0.59, therapeutic APTT 65, therapeutic CBC: Hgb and Plt stable/WNL SCr 0.53 Biopsy per IR performed at ~1630, deemed low risk for bleeding per IR consult comment.  Heparin to resume 4h after procedure Today, 5/19 -0443 0.33 and aptt=63, aptt slightly  below goal but levels essentially correlating, no infusion or bleeding issues per RN   Goal of Therapy:  Heparin level 0.3-0.7 units/ml aPTT 66-102 seconds Monitor platelets by anticoagulation protocol: Yes   Plan:   Increase heparin drip to 950 units/hr  Recheck HL in 6 hours  Will only follow heparin levels now  Daily heparin level and CBC  Continue to monitor H&H and platelets  Follow-up resume xarelto 5/19 if no further procedure planned  Dorrene Bradley 10/09/2016 6:21 AM

## 2016-10-09 NOTE — Progress Notes (Signed)
PROGRESS NOTE    Tara Bradley  GNF:[id number] DOB: 07/10/1922 DOA: 10/07/2016 PCP: [name], MD   No chief complaint on file.   Brief Narrative:  HPI on 10/07/2016 by Dr. Gevena Barre Tara Bradley is a 83 y.o. female with medical history significant of hypertension and atrial fibrillation on Xarelto who is actually quite physically active and functional. Starting approximately a week and a half ago she started having severe back pain which initially she felt started on her right side initially but then went over to her left side and stayed there. She described the pain as a 10+/10 and although moved from one side to the other, it did not radiate elsewhere. She presented to the emergency room on 5/8. She underwent a CT renal study looking for kidney stones but there were no findings to explain her left flank pain. No hydronephrosis or urolithiasis. 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  This was discussed with the patient and plans were made for outpatient follow-up. At that time she was given some oral pain medications.  Since then she has continued to have pain and now feels like it is more anterior. The pain is described as continuous present all the time as a dull heavy ache at times spasms into a sharp pain. She does not really think that anything she does makes it better or worse other than if she rolls over and lays on her left side that does seem to give her some relief. Not made better or worse by inspiration or urination. Patient states that the last time she moved her bowels was approximately 7 days ago. Patient had appointment with oncology today for evaluation of this right retroperitoneal mass but she was in such pain that she was given IV morphine which did little for it. Oncology notify the hospitalist and arrangements were made for  patient to come in for pain control, evaluation for pain as well as biopsy of retroperitoneal mass.  Assessment & Plan   Left lateral pain/intractable pain -located around patient's ribs, likely musculoskeletal pain -Patient relays that she helps lift her husband (as he has dementia). Pain is worse with deep inhalation and movement -continue IV pain control as oxycodone and hydrocodone have not controlled her pain -Doubt this is related to pelvic mass -Of note, patient has chronic lower back pain with levoscolosis and advanced degenerative disc disease -CXR: LLL atelectasis or infiltrate -Doubt this is pneumonia as patient has no signs or symptoms, currently Afebrile, no leukocytosis, no shortness of breath or cough. -Obtained CT chest/abd/pelvis to further investigate. No acute findings to explain patient's left rib pain, right upper lobe nodules with follow-up exams warranted. Aortic atherosclerosis, three-vessel coronary artery calcification. Tiny left pleural effusion. PA compression fracture age indeterminate. -Will consult PT -Wonder if this could be referred pain from T8 compression fracture? -Will order topical pain med  Right Retroperitoneal fatty mass -Noted on CT renal stone study from 09/28/2016 -Mass extends from pelvis to the diaphragm, may reflect low-grade liposarcoma -Interventional radiology consulted for biopsy -Xarelto held, placed on heparin -Will restart xarelto this evening  Diabetes mellitus, type II -Metformin held -Continue insulin signs: CBG monitoring  Atrial fibrillation -CHADSVASC 5 (given age, gender, DM, HTN) -As above, Xarelto held, will restart -Continue digoxin, diltiazem, metoprolol  Essential hypertension -Continue losartan/HCTZ, metoprolol  Constipation due to opioid therapy -Noted to  have moderate stool volume on CT scan -Continue miralax, colace  DVT Prophylaxis  Heparin  Code Status: DNR  Family Communication: None at  bedside  Disposition Plan: Admitted. Will ask PT to evaulate patient. Possible discharge to home in 24 hours pending pain control  Consultants Interventional radiology  Procedures  CT guided biopsy of fatty mass in the right retroperitoneum  Antibiotics   Anti-infectives    None      Subjective:   Tara Bradley seen and examined today.  Patient continues to complain of left-sided pain which worsens with movement. She denies chest pain, shortness of breath, abdominal pain, nausea or vomiting.   Objective:   Vitals:   10/08/16 1645 10/08/16 2116 10/09/16 0458 10/09/16 1053  BP: 128/82 130/90 128/89 (!) 146/97  Pulse: (!) 110 63 68 95  Resp: 16 18 16    Temp: 97.7 F (36.5 C) 98.2 F (36.8 C) 98 F (36.7 C)   TempSrc: Oral Oral Oral   SpO2: 94% 95% 96%   Weight:      Height:        Intake/Output Summary (Last 24 hours) at 10/09/16 1116 Last data filed at 10/09/16 0651  Gross per 24 hour  Intake            770.6 ml  Output              850 ml  Net            -79.4 ml   Filed Weights   10/08/16 1322  Weight: 61.2 kg (135 lb)   Exam  General: Well developed, well nourished, NAD, appears stated age  53: NCAT,mucous membranes moist.   Cardiovascular: S1 S2 auscultated, irregularly irregular  Respiratory: Clear to auscultation bilaterally with equal chest rise  Abdomen: Soft, nontender, nondistended, + bowel sounds  Extremities: warm dry without cyanosis clubbing or edema  Neuro: AAOx3, nonfocal  Psych: appropriate mood and affect  Data Reviewed: I have personally reviewed following labs and imaging studies  CBC:  Recent Labs Lab 10/07/16 1845 10/08/16 0404 10/09/16 0443  WBC 10.1 8.6 7.9  HGB 14.7 14.5 14.7  HCT 41.8 41.5 43.0  MCV 88.6 87.7 89.0  PLT 255 268 283   Basic Metabolic Panel:  Recent Labs Lab 10/07/16 1845 10/09/16 0443  NA 129* 134*  K 4.0 3.6  CL 92* 93*  CO2 26 30  GLUCOSE 241* 164*  BUN 11 14  CREATININE 0.53 0.59   CALCIUM 9.1 9.1   GFR: Estimated Creatinine Clearance: 38.7 mL/min (by C-G formula based on SCr of 0.59 mg/dL). Liver Function Tests:  Recent Labs Lab 10/07/16 1845  AST 16  ALT 8*  ALKPHOS 132*  BILITOT 1.0  PROT 6.7  ALBUMIN 3.5   No results for input(s): LIPASE, AMYLASE in the last 168 hours. No results for input(s): AMMONIA in the last 168 hours. Coagulation Profile:  Recent Labs Lab 10/07/16 2012  INR 1.17   Cardiac Enzymes: No results for input(s): CKTOTAL, CKMB, CKMBINDEX, TROPONINI in the last 168 hours. BNP (last 3 results) No results for input(s): PROBNP in the last 8760 hours. HbA1C:  Recent Labs  10/07/16 1845  HGBA1C 8.5*   CBG:  Recent Labs Lab 10/08/16 0720 10/08/16 1246 10/08/16 1645 10/08/16 2120 10/09/16 0754  GLUCAP 191* 224* 242* 268* 191*   Lipid Profile: No results for input(s): CHOL, HDL, LDLCALC, TRIG, CHOLHDL, LDLDIRECT in the last 72 hours. Thyroid Function Tests: No results for input(s): TSH, T4TOTAL, FREET4, T3FREE, THYROIDAB  in the last 72 hours. Anemia Panel: No results for input(s): VITAMINB12, FOLATE, FERRITIN, TIBC, IRON, RETICCTPCT in the last 72 hours. Urine analysis:    Component Value Date/Time   COLORURINE STRAW (A) 10/08/2016 0454   APPEARANCEUR CLEAR 10/08/2016 0454   LABSPEC 1.002 (L) 10/08/2016 0454   PHURINE 7.0 10/08/2016 0454   GLUCOSEU >=500 (A) 10/08/2016 0454   HGBUR SMALL (A) 10/08/2016 0454   BILIRUBINUR NEGATIVE 10/08/2016 0454   BILIRUBINUR n 07/23/2013 1343   KETONESUR 5 (A) 10/08/2016 0454   PROTEINUR NEGATIVE 10/08/2016 0454   UROBILINOGEN 0.2 07/23/2013 1343   UROBILINOGEN 0.2 03/07/2012 2123   NITRITE NEGATIVE 10/08/2016 0454   LEUKOCYTESUR NEGATIVE 10/08/2016 0454   Sepsis Labs: @LABRCNTIP (procalcitonin:4,lacticidven:4)  )No results found for this or any previous visit (from the past 240 hour(s)).    Radiology Studies: Ct Abdomen Pelvis Wo Contrast  Addendum Date: 10/09/2016    ADDENDUM REPORT: 10/09/2016 10:56 ADDENDUM: The following is added to the impression section of the original report: 7. Right retroperitoneal fat density mass, as before, likely a lipoma. Difficult to definitively exclude liposarcoma. Electronically Signed   By: Lorin Picket M.D.   On: 10/09/2016 10:56   Result Date: 10/09/2016 CLINICAL DATA:  Breast cancer, left rib pain. EXAM: CT CHEST, ABDOMEN AND PELVIS WITHOUT CONTRAST TECHNIQUE: Multidetector CT imaging of the chest, abdomen and pelvis was performed following the standard protocol without IV contrast. COMPARISON:  CT abdomen pelvis 09/28/2016. FINDINGS: CT CHEST FINDINGS Cardiovascular: Atherosclerotic calcification of the arterial vasculature, including three-vessel involvement of the coronary arteries. Heart is at the upper limits of normal in size to mildly enlarged. No pericardial effusion. Mediastinum/Nodes: Left lobe of thyroid contains 10 mm low-attenuation nodule. No pathologically enlarged mediastinal or axillary lymph nodes. Surgical clips are seen in the left axilla. Hilar regions are difficult to evaluate without IV contrast. Esophagus is grossly unremarkable. Lungs/Pleura: 4 mm nodule in the apical segment right upper lobe. 3 mm subpleural nodule in the anterior segment right upper lobe. Tiny left pleural effusion. Airway is unremarkable. Musculoskeletal: Degenerative changes in the spine. Old posterolateral left rib fractures. Healed left scapular fracture. No worrisome lytic or sclerotic lesions. CT ABDOMEN PELVIS FINDINGS Hepatobiliary: 2.0 cm low-attenuation lesion in the peripheral right hepatic lobe, stable. Further characterization is limited without post-contrast imaging. Liver is otherwise unremarkable. There may be sludge and small stones in the gallbladder. No biliary ductal dilatation. Pancreas: Negative. Spleen: Negative. Adrenals/Urinary Tract: Adrenal glands are unremarkable. Subcentimeter hyperattenuating lesion in the  posterior interpolar right kidney is unchanged and too small to characterize. Kidneys are otherwise unremarkable. Bladder is grossly unremarkable. Stomach/Bowel: Stomach, small bowel and colon are unremarkable. Appendix is not readily visualized. Vascular/Lymphatic: Atherosclerotic calcification of the arterial vasculature without abdominal aortic aneurysm. No pathologically enlarged lymph nodes. Reproductive: Hysterectomy.  No adnexal mass. Other: No free fluid. Tiny right inguinal hernia contains fat. 4.1 x 9.9 cm fat density lesion with scattered internal calcification and wispy soft tissue is again seen in the lateral right abdomen, stable. Mesenteries and peritoneum are otherwise unremarkable. Musculoskeletal: Old bilateral superior and inferior pubic rami fractures. Intramedullary rod is seen in the proximal right femur. Sclerotic lesions in the lumbar spine and left iliac wing are unchanged. Degenerative changes throughout the spine with scoliosis. T8 compression fracture, age indeterminate. IMPRESSION: 1. No acute findings to explain the patient's left rib pain. 2. Right upper lobe nodules. This patient with a history of breast cancer, continued attention on followup exams is warranted. 3. Aortic atherosclerosis (  ICD10-170.0). Three-vessel coronary artery calcification. 4. Tiny left pleural effusion. 5. Sludge and stones in the gallbladder. 6. T8 compression fracture age indeterminate. Electronically Signed: By: Lorin Picket M.D. On: 10/09/2016 10:33   Dg Chest 2 View  Result Date: 10/08/2016 CLINICAL DATA:  Left lower rib pain.  Right pelvic mass. EXAM: CHEST  2 VIEW COMPARISON:  02/16/2012 FINDINGS: Heart size is normal. There is aortic atherosclerosis with calcification and tortuosity. The patient has developed left lower lobe pneumonia and collapse. The remainder the chest is clear. There are old healed rib fractures on the left, newly seen since 2013. IMPRESSION: Left lower lobe atelectasis/  infiltrate. Electronically Signed   By: Nelson Chimes M.D.   On: 10/08/2016 15:16   Ct Chest Wo Contrast  Addendum Date: 10/09/2016   ADDENDUM REPORT: 10/09/2016 10:56 ADDENDUM: The following is added to the impression section of the original report: 7. Right retroperitoneal fat density mass, as before, likely a lipoma. Difficult to definitively exclude liposarcoma. Electronically Signed   By: Lorin Picket M.D.   On: 10/09/2016 10:56   Result Date: 10/09/2016 CLINICAL DATA:  Breast cancer, left rib pain. EXAM: CT CHEST, ABDOMEN AND PELVIS WITHOUT CONTRAST TECHNIQUE: Multidetector CT imaging of the chest, abdomen and pelvis was performed following the standard protocol without IV contrast. COMPARISON:  CT abdomen pelvis 09/28/2016. FINDINGS: CT CHEST FINDINGS Cardiovascular: Atherosclerotic calcification of the arterial vasculature, including three-vessel involvement of the coronary arteries. Heart is at the upper limits of normal in size to mildly enlarged. No pericardial effusion. Mediastinum/Nodes: Left lobe of thyroid contains 10 mm low-attenuation nodule. No pathologically enlarged mediastinal or axillary lymph nodes. Surgical clips are seen in the left axilla. Hilar regions are difficult to evaluate without IV contrast. Esophagus is grossly unremarkable. Lungs/Pleura: 4 mm nodule in the apical segment right upper lobe. 3 mm subpleural nodule in the anterior segment right upper lobe. Tiny left pleural effusion. Airway is unremarkable. Musculoskeletal: Degenerative changes in the spine. Old posterolateral left rib fractures. Healed left scapular fracture. No worrisome lytic or sclerotic lesions. CT ABDOMEN PELVIS FINDINGS Hepatobiliary: 2.0 cm low-attenuation lesion in the peripheral right hepatic lobe, stable. Further characterization is limited without post-contrast imaging. Liver is otherwise unremarkable. There may be sludge and small stones in the gallbladder. No biliary ductal dilatation. Pancreas:  Negative. Spleen: Negative. Adrenals/Urinary Tract: Adrenal glands are unremarkable. Subcentimeter hyperattenuating lesion in the posterior interpolar right kidney is unchanged and too small to characterize. Kidneys are otherwise unremarkable. Bladder is grossly unremarkable. Stomach/Bowel: Stomach, small bowel and colon are unremarkable. Appendix is not readily visualized. Vascular/Lymphatic: Atherosclerotic calcification of the arterial vasculature without abdominal aortic aneurysm. No pathologically enlarged lymph nodes. Reproductive: Hysterectomy.  No adnexal mass. Other: No free fluid. Tiny right inguinal hernia contains fat. 4.1 x 9.9 cm fat density lesion with scattered internal calcification and wispy soft tissue is again seen in the lateral right abdomen, stable. Mesenteries and peritoneum are otherwise unremarkable. Musculoskeletal: Old bilateral superior and inferior pubic rami fractures. Intramedullary rod is seen in the proximal right femur. Sclerotic lesions in the lumbar spine and left iliac wing are unchanged. Degenerative changes throughout the spine with scoliosis. T8 compression fracture, age indeterminate. IMPRESSION: 1. No acute findings to explain the patient's left rib pain. 2. Right upper lobe nodules. This patient with a history of breast cancer, continued attention on followup exams is warranted. 3. Aortic atherosclerosis (ICD10-170.0). Three-vessel coronary artery calcification. 4. Tiny left pleural effusion. 5. Sludge and stones in the gallbladder. 6. T8  compression fracture age indeterminate. Electronically Signed: By: Lorin Picket M.D. On: 10/09/2016 10:33   Ct Biopsy  Result Date: 10/08/2016 INDICATION: 83 year old female with a nonspecific fatty mass in the right retroperitoneal space which might represent a benign lipoma or low grade liposarcoma. She presents for CT-guided biopsy. EXAM: CT BIOPSY MEDICATIONS: None. ANESTHESIA/SEDATION: Moderate (conscious) sedation was  employed during this procedure. A total of Versed 2 mg and Fentanyl 50 mcg was administered intravenously. Moderate Sedation Time: 15 minutes. The patient's level of consciousness and vital signs were monitored continuously by radiology nursing throughout the procedure under my direct supervision. FLUOROSCOPY TIME:  Fluoroscopy Time: 0 minutes 0 seconds (0 mGy). COMPLICATIONS: None immediate. PROCEDURE: Informed written consent was obtained from the patient after a thorough discussion of the procedural risks, benefits and alternatives. All questions were addressed. Maximal Sterile Barrier Technique was utilized including caps, mask, sterile gowns, sterile gloves, sterile drape, hand hygiene and skin antiseptic. A timeout was performed prior to the initiation of the procedure. A planning axial CT scan was performed. The fatty mass in the right retroperitoneal space in the right lower quadrant was identified. A suitable skin entry site was selected and marked. Local anesthesia was attained by infiltration with 1% lidocaine. A small dermatotomy was made. Under intermittent CT guidance, a 17 gauge introducer needle was advanced into the fatty tissue. Multiple 18 gauge core biopsies were then coaxially obtained. Biopsy specimens were placed in formalin and delivered to pathology for further analysis. Post biopsy axial CT imaging demonstrates no evidence of complication. The patient tolerated the procedure well. IMPRESSION: Technically successful CT-guided core biopsy of right retroperitoneal fatty mass. Electronically Signed   By: Jacqulynn Cadet M.D.   On: 10/08/2016 16:58     Scheduled Meds: . digoxin  125 mcg Oral Daily  . diltiazem  300 mg Oral Daily  . docusate sodium  100 mg Oral BID  . losartan  100 mg Oral Daily   And  . hydrochlorothiazide  12.5 mg Oral Daily  . insulin aspart  0-5 Units Subcutaneous QHS  . insulin aspart  0-9 Units Subcutaneous TID WC  . magnesium oxide  200 mg Oral Daily  .  metoprolol tartrate  25 mg Oral BID  . polyethylene glycol  17 g Oral Daily  . sodium chloride flush  3 mL Intravenous Q12H  . temazepam  15 mg Oral QHS   Continuous Infusions: . heparin 950 Units/hr (10/09/16 0645)     LOS: 2 days   Time Spent in minutes   30 minutes  Daryan Buell D.O. on 10/09/2016 at 11:16 AM  Between 7am to 7pm - Pager - [phone]  After 7pm go to [email or web address] - password [email or web address]  And look for the night coverage person covering for me after hours  [facility] Office  [phone]

## 2016-10-10 LAB — GLUCOSE, CAPILLARY
Glucose-Capillary: 136 mg/dL — ABNORMAL HIGH (ref 65–99)
Glucose-Capillary: 225 mg/dL — ABNORMAL HIGH (ref 65–99)

## 2016-10-10 MED ORDER — METHYLNALTREXONE BROMIDE 12 MG/0.6ML ~~LOC~~ SOLN
12.0000 mg | Freq: Once | SUBCUTANEOUS | Status: AC
  Administered 2016-10-10: 12 mg via SUBCUTANEOUS
  Filled 2016-10-10: qty 0.6

## 2016-10-10 MED ORDER — SORBITOL 70 % SOLN
960.0000 mL | TOPICAL_OIL | Freq: Once | ORAL | Status: DC
  Filled 2016-10-10: qty 240

## 2016-10-10 MED ORDER — POLYETHYLENE GLYCOL 3350 17 G PO PACK: 17.0000 g | PACK | Freq: Every day | ORAL | 0 refills | Status: DC

## 2016-10-10 MED ORDER — DOCUSATE SODIUM 100 MG PO CAPS: 100.0000 mg | ORAL_CAPSULE | Freq: Two times a day (BID) | ORAL | 0 refills | Status: DC

## 2016-10-10 MED ORDER — BISACODYL 10 MG RE SUPP
10.0000 mg | Freq: Once | RECTAL | Status: AC
  Administered 2016-10-10: 10 mg via RECTAL
  Filled 2016-10-10: qty 1

## 2016-10-10 MED ORDER — DICLOFENAC SODIUM 1 % TD GEL: 2.0000 g | Freq: Four times a day (QID) | TRANSDERMAL | 0 refills | Status: DC

## 2016-10-10 NOTE — Care Management Note (Signed)
Case Management Note  Patient Details  Name: Tara Bradley MRN: [id number] Date of Birth: 07-15-1922  Subjective/Objective:     Left lateral pain, right retroperitoneal fatty mass, DM, HTN               Action/Plan: Discharge Planning: NCM spoke to pt and son, grand-dtr at bedside. Offered choice/list provided for [facility]. Pt agreeable to [facility] for HHPT. Pt was independent prior to hospital stay. Grand-dtr will be in town until 10/28/2016 and will assist her at home.   PCP [name]  MD   Expected Discharge Date:  10/10/16               Expected Discharge Plan:  [place]  In-House Referral:  NA  Discharge planning Services  CM Consult  Post Acute Care Choice:  Home Health Choice offered to:  Adult Children  DME Arranged:  N/A DME Agency:  NA  HH Arranged:  PT HH Agency:  [facility] (formerly [facility])  Status of Service:  Completed, signed off  If discussed at [employer], dates discussed:    Additional Comments:  Erenest Rasher, RN 10/10/2016, 3:21 PM

## 2016-10-10 NOTE — Plan of Care (Signed)
Problem: Bowel/Gastric: Goal: Will not experience complications related to bowel motility Outcome: Not Progressing LBM 5/18 after suppository, but pt states no BM 10 days before that. Requests stronger laxative.

## 2016-10-10 NOTE — Plan of Care (Signed)
Problem: Tissue Perfusion: Goal: Risk factors for ineffective tissue perfusion will decrease Outcome: Progressing Heparin gtt d/c'd, restarted on Xarelto.

## 2016-10-10 NOTE — Care Management Important Message (Signed)
Important Message  Patient Details  Name: Tara Bradley MRN: [id number] Date of Birth: [birth year]   Medicare Important Message Given:  Yes    Erenest Rasher, RN 10/10/2016, 2:46 PM

## 2016-10-10 NOTE — Discharge Instructions (Signed)
Constipation, Adult Constipation is when a person:  Poops (has a bowel movement) fewer times in a week than normal.  Has a hard time pooping.  Has poop that is dry, hard, or bigger than normal. Follow these instructions at home: Eating and drinking    Eat foods that have a lot of fiber, such as:  Fresh fruits and vegetables.  Whole grains.  Beans.  Eat less of foods that are high in fat, low in fiber, or overly processed, such as:  [place] fries.  Hamburgers.  Cookies.  Candy.  Soda.  Drink enough fluid to keep your pee (urine) clear or pale yellow. General instructions   Exercise regularly or as told by your doctor.  Go to the restroom when you feel like you need to poop. Do not hold it in.  Take over-the-counter and prescription medicines only as told by your doctor. These include any fiber supplements.  Do pelvic floor retraining exercises, such as:  Doing deep breathing while relaxing your lower belly (abdomen).  Relaxing your pelvic floor while pooping.  Watch your condition for any changes.  Keep all follow-up visits as told by your doctor. This is important. Contact a doctor if:  You have pain that gets worse.  You have a fever.  You have not pooped for 4 days.  You throw up (vomit).  You are not hungry.  You lose weight.  You are bleeding from the anus.  You have thin, pencil-like poop (stool). Get help right away if:  You have a fever, and your symptoms suddenly get worse.  You leak poop or have blood in your poop.  Your belly feels hard or bigger than normal (is bloated).  You have very bad belly pain.  You feel dizzy or you faint. This information is not intended to replace advice given to you by your health care provider. Make sure you discuss any questions you have with your health care provider. Document Released: 10/27/2007 Document Revised: 11/28/2015 Document Reviewed: 10/29/2015 Elsevier Interactive Patient Education   2017 [employer] Musculoskeletal Pain Musculoskeletal pain is muscle and bone aches and pains. This pain can occur in any part of the body. Follow these instructions at home:  Only take medicines for pain, discomfort, or fever as told by your health care provider.  You may continue all activities unless the activities cause more pain. When the pain lessens, slowly resume normal activities. Gradually increase the intensity and duration of the activities or exercise.  During periods of severe pain, bed rest may be helpful. Lie or sit in any position that is comfortable, but get out of bed and walk around at least every several hours.  If directed, put ice on the injured area.  Put ice in a plastic bag.  Place a towel between your skin and the bag.  Leave the ice on for 20 minutes, 2-3 times a day. Contact a health care provider if:  Your pain is getting worse.  Your pain is not relieved with medicines.  You lose function in the area of the pain if the pain is in your arms, legs, or neck. This information is not intended to replace advice given to you by your health care provider. Make sure you discuss any questions you have with your health care provider. Document Released: 05/10/2005 Document Revised: 10/21/2015 Document Reviewed: 01/12/2013 Elsevier Interactive Patient Education  2017 [employer].

## 2016-10-10 NOTE — Discharge Summary (Signed)
Physician Discharge Summary  Tara Bradley BTD:[id number] DOB: 05/12/1923 DOA: 10/07/2016  PCP: [name], MD  Admit date: 10/07/2016 Discharge date: 10/10/2016  Time spent: 45 minutes  Recommendations for Outpatient Follow-up:  Patient will be discharged to home with home health physical and therapy.  Patient will need to follow up with primary care provider within one week of discharge.  Patient should continue medications as prescribed.  Patient should follow a heart healthy/carb modified diet.    Discharge Diagnoses:  Left lateral pain/intractable pain Right Retroperitoneal fatty mass Diabetes mellitus, type II Atrial fibrillation Essential hypertension Constipation due to opioid therapy  Discharge Condition: Stable  Diet recommendation: heart healthy/carb modified  Filed Weights   10/08/16 1322  Weight: 61.2 kg (135 lb)    History of present illness:  on 10/07/2016 by Dr. Kathaleen Bury Mathewsis a 83 y.o.femalewith medical history significant of hypertension and atrial fibrillation on Xarelto who is actually quite physically active and functional. Starting approximately a week and a half ago she started having severe back pain which initially she felt started on her right side initially but then went over to her left side and stayed there. She described the pain as a 10+/10 and although moved from one side to the other, it did not radiate elsewhere. She presented to the emergency room on 5/8. She underwent a CT renal study looking for kidney stones but there were no findings to explain her left flank pain. No hydronephrosis or urolithiasis. 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 This was discussed with the patient and plans were made for outpatient follow-up. At that time she was given some oral pain medications.    Since then she has continued to have pain and now feels like it is more anterior. The pain is described as continuous present all the time as a dull heavy ache at times spasms into a sharp pain. She does not really think that anything she does makes it better or worse other than if she rolls over and lays on her left side that does seem to give her some relief. Not made better or worse by inspiration or urination. Patient states that the last time she moved her bowels was approximately 7 days ago. Patient had appointment with oncology today for evaluation of this right retroperitoneal mass but she was in such pain that she was given IV morphine which did little for it. Oncology notify the hospitalist and arrangements were made for patient to come in for pain control, evaluation for pain as well as biopsy of retroperitoneal mass.  Hospital Course:  Left lateral pain/intractable pain -located around patient's ribs, likely musculoskeletal pain -Patient relays that she helps lift her husband (as he has dementia). Pain is worse with deep inhalation and movement -continue IV pain control as oxycodone and hydrocodone have not controlled her pain -Doubt this is related to pelvic mass -Of note, patient has chronic lower back pain with levoscolosis and advanced degenerative disc disease -CXR: LLL atelectasis or infiltrate -Doubt this is pneumonia as patient has no signs or symptoms, currently Afebrile, no leukocytosis, no shortness of breath or cough. -Obtained CT chest/abd/pelvis to further investigate. No acute findings to explain patient's left rib pain, right upper lobe nodules with follow-up exams warranted. Aortic atherosclerosis, three-vessel coronary artery calcification. Tiny left pleural effusion. PA compression fracture age indeterminate. -Wonder if this  could be referred pain from T8 compression fracture? -Physical therapy consulted and recommended home health -Have ordered Voltaren gel  Right  Retroperitoneal fatty mass -Noted on CT renal stone study from 09/28/2016 -Mass extends from pelvis to the diaphragm, may reflect low-grade liposarcoma -Interventional radiology consulted for biopsy -Xarelto held, placed on heparin for biopsy -Biopsy results pending -Continue Xarelto  Diabetes mellitus, type II -Metformin held, restart upon discharge- dose may need to be increased as patient is taking 1000 mg every evening -Hemoglobin A1c 8.5 -Patient will need to speak to her PCP regarding DM management. -Blood sugars have been mainly controlled on some sliding scale during patient's hospitalization.  Atrial fibrillation -CHADSVASC 5 (given age, gender, DM, HTN) -As above, Xarelto held, will restart -Continue digoxin, diltiazem, metoprolol  Essential hypertension -Continue losartan/HCTZ, metoprolol  Constipation due to opioid therapy -Noted to have moderate stool volume on CT scan -Continue miralax, colace -given suppository, with minimal response -have ordered Relistor, and was able to have a small bowel movement  Consultants Interventional radiology  Procedures  CT guided biopsy of fatty mass in the right retroperitoneum  Discharge Exam: Vitals:   10/09/16 2044 10/10/16 0454  BP: 110/78 120/90  Pulse: 76 66  Resp: 18 18  Temp: 98 F (36.7 C) 98.2 F (36.8 C)   Patient feels left-sided pain is improving. Continues to have some mild pain but only wants to take Tylenol. Does not feel morphine gives her much relief. Denies any chest pain, shortness of breath, abdominal pain, nausea or vomiting. States she still feeling mildly constipated but does endorse having a small bowel movement after receiving a suppository.   General: Well developed, well nourished, NAD, appears stated age  83: NCAT, mucous membranes moist.  Cardiovascular: S1 S2 auscultated, irregularly irregular  Respiratory: Clear to auscultation bilaterally  Abdomen: Soft, nontender, nondistended,  + bowel sounds  Extremities: warm dry without cyanosis clubbing or edema  Neuro: AAOx3, nonfocal  Psych: Normal affect and demeanor, pleasant   Discharge Instructions Discharge Instructions    Discharge instructions    Complete by:  As directed    Patient will be discharged to home with home health physical and therapy.  Patient will need to follow up with primary care provider within one week of discharge.  Patient should continue medications as prescribed.  Patient should follow a heart healthy/carb modified diet.   Discharge patient    Complete by:  As directed    Discharge disposition:  01-Home or Self Care   Discharge patient date:  10/10/2016   Face-to-face encounter (required for Medicare/Medicaid patients)    Complete by:  As directed    I Benuel Ly, [facility] certify that this patient is under my care and that I, or a nurse practitioner or physician's assistant working with me, had a face-to-face encounter that meets the physician face-to-face encounter requirements with this patient on 10/10/2016. The encounter with the patient was in whole, or in part for the following medical condition(s) which is the primary reason for home health care (List medical condition): Musculoskeletal pain   The encounter with the patient was in whole, or in part, for the following medical condition, which is the primary reason for home health care:  musculoskeletal pain   I certify that, based on my findings, the following services are medically necessary home health services:  Physical therapy   Reason for Medically Necessary Home Health Services:  Therapy- Therapeutic Exercises to Increase Strength and Endurance   My clinical findings support the need  for the above services:  Pain interferes with ambulation/mobility   Further, I certify that my clinical findings support that this patient is homebound due to:  Pain interferes with ambulation/mobility   Home Health    Complete by:  As directed    To  provide the following care/treatments:  PT     Current Discharge Medication List    START taking these medications   Details  diclofenac sodium (VOLTAREN) 1 % GEL Apply 2 g topically 4 (four) times daily. Qty: 100 g, Refills: 0    docusate sodium (COLACE) 100 MG capsule Take 1 capsule (100 mg total) by mouth 2 (two) times daily. Qty: 10 capsule, Refills: 0    polyethylene glycol (MIRALAX / GLYCOLAX) packet Take 17 g by mouth daily. Qty: 14 each, Refills: 0      CONTINUE these medications which have NOT CHANGED   Details  Ascorbic Acid (VITAMIN C PO) Take 1 tablet by mouth daily.    B Complex-C (B-COMPLEX WITH VITAMIN C) tablet Take 1 tablet by mouth daily.    Calcium Carbonate-Vitamin D (CALTRATE 600+D) 600-400 MG-UNIT per tablet Take 1 tablet by mouth daily.      CARTIA XT 300 MG 24 hr capsule TAKE 1 CAPSULE (300 MG TOTAL) BY MOUTH DAILY. Qty: 90 capsule, Refills: 1    Cholecalciferol (VITAMIN D) 2000 UNITS tablet Take 1,000 Units by mouth daily.     digoxin (DIGOX) 0.125 MG tablet Take 1 tablet (125 mcg total) by mouth daily. Qty: 90 tablet, Refills: 3    losartan-hydrochlorothiazide (HYZAAR) 100-12.5 MG per tablet Take 1 tablet by mouth daily. Qty: 90 tablet, Refills: 3    Magnesium 250 MG TABS Take 1 tablet by mouth daily.      metFORMIN (GLUMETZA) 500 MG (MOD) 24 hr tablet Take 1,000 mg by mouth every evening.    metoprolol tartrate (LOPRESSOR) 25 MG tablet Take 1 tablet (25 mg total) by mouth 2 (two) times daily. Qty: 180 tablet, Refills: 3    potassium chloride (K-DUR) 10 MEQ tablet     Rivaroxaban (XARELTO) 15 MG TABS tablet Take 1 tablet (15 mg total) by mouth daily with supper. Qty: 90 tablet, Refills: 2    temazepam (RESTORIL) 15 MG capsule TAKE ONE CAPSULE AT BEDTIME AS NEEDED Refills: 5       Allergies  Allergen Reactions  . Ambien [Zolpidem Tartrate]     Hallucinations   . Codeine Other (See Comments)    headache  . Codeine Sulfate      REACTION: unspecified   Follow-up Information    [name], MD. Schedule an appointment as soon as possible for a visit in 1 week(s).   Specialty:  Family Medicine Why:  Hospital follow up Contact information: [address] [place] [address] [phone]            The results of significant diagnostics from this hospitalization (including imaging, microbiology, ancillary and laboratory) are listed below for reference.    Significant Diagnostic Studies: Ct Abdomen Pelvis Wo Contrast  Addendum Date: 10/09/2016   ADDENDUM REPORT: 10/09/2016 10:56 ADDENDUM: The following is added to the impression section of the original report: 7. Right retroperitoneal fat density mass, as before, likely a lipoma. Difficult to definitively exclude liposarcoma. Electronically Signed   By: Lorin Picket M.D.   On: 10/09/2016 10:56   Result Date: 10/09/2016 CLINICAL DATA:  Breast cancer, left rib pain. EXAM: CT CHEST, ABDOMEN AND PELVIS WITHOUT CONTRAST TECHNIQUE: Multidetector CT  imaging of the chest, abdomen and pelvis was performed following the standard protocol without IV contrast. COMPARISON:  CT abdomen pelvis 09/28/2016. FINDINGS: CT CHEST FINDINGS Cardiovascular: Atherosclerotic calcification of the arterial vasculature, including three-vessel involvement of the coronary arteries. Heart is at the upper limits of normal in size to mildly enlarged. No pericardial effusion. Mediastinum/Nodes: Left lobe of thyroid contains 10 mm low-attenuation nodule. No pathologically enlarged mediastinal or axillary lymph nodes. Surgical clips are seen in the left axilla. Hilar regions are difficult to evaluate without IV contrast. Esophagus is grossly unremarkable. Lungs/Pleura: 4 mm nodule in the apical segment right upper lobe. 3 mm subpleural nodule in the anterior segment right upper lobe. Tiny left pleural effusion. Airway is unremarkable. Musculoskeletal: Degenerative changes in the  spine. Old posterolateral left rib fractures. Healed left scapular fracture. No worrisome lytic or sclerotic lesions. CT ABDOMEN PELVIS FINDINGS Hepatobiliary: 2.0 cm low-attenuation lesion in the peripheral right hepatic lobe, stable. Further characterization is limited without post-contrast imaging. Liver is otherwise unremarkable. There may be sludge and small stones in the gallbladder. No biliary ductal dilatation. Pancreas: Negative. Spleen: Negative. Adrenals/Urinary Tract: Adrenal glands are unremarkable. Subcentimeter hyperattenuating lesion in the posterior interpolar right kidney is unchanged and too small to characterize. Kidneys are otherwise unremarkable. Bladder is grossly unremarkable. Stomach/Bowel: Stomach, small bowel and colon are unremarkable. Appendix is not readily visualized. Vascular/Lymphatic: Atherosclerotic calcification of the arterial vasculature without abdominal aortic aneurysm. No pathologically enlarged lymph nodes. Reproductive: Hysterectomy.  No adnexal mass. Other: No free fluid. Tiny right inguinal hernia contains fat. 4.1 x 9.9 cm fat density lesion with scattered internal calcification and wispy soft tissue is again seen in the lateral right abdomen, stable. Mesenteries and peritoneum are otherwise unremarkable. Musculoskeletal: Old bilateral superior and inferior pubic rami fractures. Intramedullary rod is seen in the proximal right femur. Sclerotic lesions in the lumbar spine and left iliac wing are unchanged. Degenerative changes throughout the spine with scoliosis. T8 compression fracture, age indeterminate. IMPRESSION: 1. No acute findings to explain the patient's left rib pain. 2. Right upper lobe nodules. This patient with a history of breast cancer, continued attention on followup exams is warranted. 3. Aortic atherosclerosis (ICD10-170.0). Three-vessel coronary artery calcification. 4. Tiny left pleural effusion. 5. Sludge and stones in the gallbladder. 6. T8  compression fracture age indeterminate. Electronically Signed: By: Lorin Picket M.D. On: 10/09/2016 10:33   Dg Chest 2 View  Result Date: 10/08/2016 CLINICAL DATA:  Left lower rib pain.  Right pelvic mass. EXAM: CHEST  2 VIEW COMPARISON:  02/16/2012 FINDINGS: Heart size is normal. There is aortic atherosclerosis with calcification and tortuosity. The patient has developed left lower lobe pneumonia and collapse. The remainder the chest is clear. There are old healed rib fractures on the left, newly seen since 2013. IMPRESSION: Left lower lobe atelectasis/ infiltrate. Electronically Signed   By: Nelson Chimes M.D.   On: 10/08/2016 15:16   Ct Chest Wo Contrast  Addendum Date: 10/09/2016   ADDENDUM REPORT: 10/09/2016 10:56 ADDENDUM: The following is added to the impression section of the original report: 7. Right retroperitoneal fat density mass, as before, likely a lipoma. Difficult to definitively exclude liposarcoma. Electronically Signed   By: Lorin Picket M.D.   On: 10/09/2016 10:56   Result Date: 10/09/2016 CLINICAL DATA:  Breast cancer, left rib pain. EXAM: CT CHEST, ABDOMEN AND PELVIS WITHOUT CONTRAST TECHNIQUE: Multidetector CT imaging of the chest, abdomen and pelvis was performed following the standard protocol without IV contrast. COMPARISON:  CT  abdomen pelvis 09/28/2016. FINDINGS: CT CHEST FINDINGS Cardiovascular: Atherosclerotic calcification of the arterial vasculature, including three-vessel involvement of the coronary arteries. Heart is at the upper limits of normal in size to mildly enlarged. No pericardial effusion. Mediastinum/Nodes: Left lobe of thyroid contains 10 mm low-attenuation nodule. No pathologically enlarged mediastinal or axillary lymph nodes. Surgical clips are seen in the left axilla. Hilar regions are difficult to evaluate without IV contrast. Esophagus is grossly unremarkable. Lungs/Pleura: 4 mm nodule in the apical segment right upper lobe. 3 mm subpleural nodule in  the anterior segment right upper lobe. Tiny left pleural effusion. Airway is unremarkable. Musculoskeletal: Degenerative changes in the spine. Old posterolateral left rib fractures. Healed left scapular fracture. No worrisome lytic or sclerotic lesions. CT ABDOMEN PELVIS FINDINGS Hepatobiliary: 2.0 cm low-attenuation lesion in the peripheral right hepatic lobe, stable. Further characterization is limited without post-contrast imaging. Liver is otherwise unremarkable. There may be sludge and small stones in the gallbladder. No biliary ductal dilatation. Pancreas: Negative. Spleen: Negative. Adrenals/Urinary Tract: Adrenal glands are unremarkable. Subcentimeter hyperattenuating lesion in the posterior interpolar right kidney is unchanged and too small to characterize. Kidneys are otherwise unremarkable. Bladder is grossly unremarkable. Stomach/Bowel: Stomach, small bowel and colon are unremarkable. Appendix is not readily visualized. Vascular/Lymphatic: Atherosclerotic calcification of the arterial vasculature without abdominal aortic aneurysm. No pathologically enlarged lymph nodes. Reproductive: Hysterectomy.  No adnexal mass. Other: No free fluid. Tiny right inguinal hernia contains fat. 4.1 x 9.9 cm fat density lesion with scattered internal calcification and wispy soft tissue is again seen in the lateral right abdomen, stable. Mesenteries and peritoneum are otherwise unremarkable. Musculoskeletal: Old bilateral superior and inferior pubic rami fractures. Intramedullary rod is seen in the proximal right femur. Sclerotic lesions in the lumbar spine and left iliac wing are unchanged. Degenerative changes throughout the spine with scoliosis. T8 compression fracture, age indeterminate. IMPRESSION: 1. No acute findings to explain the patient's left rib pain. 2. Right upper lobe nodules. This patient with a history of breast cancer, continued attention on followup exams is warranted. 3. Aortic atherosclerosis  (ICD10-170.0). Three-vessel coronary artery calcification. 4. Tiny left pleural effusion. 5. Sludge and stones in the gallbladder. 6. T8 compression fracture age indeterminate. Electronically Signed: By: Lorin Picket M.D. On: 10/09/2016 10:33   Ct L-spine No Charge  Result Date: 09/28/2016 CLINICAL DATA:  Left flank pain. Patient has to lift demented has been at home and think she has pulled muscle. No extremity radiation EXAM: CT LUMBAR SPINE WITHOUT CONTRAST TECHNIQUE: Lumbar spine reformats of abdominal CT were generated as requested. COMPARISON: COMPARISON:  None. FINDINGS: Segmentation: There are 5 lumbar type vertebral bodies, but the right L5 transverse processes is non segmented from the right sacral ala. Alignment: Prominent scoliosis. Grade 1 anterolisthesis at L4-5, facet mediated. Mild retrolisthesis at L1-2. Vertebrae: No evidence of acute fracture. No endplate erosion or evidence of focal bone lesion. Remote right sacral ala fracture. Paraspinal and other soft tissues: Described on separate abdominal CT Disc levels: T12- L1: Unremarkable. L1-L2: Advanced degenerative disc narrowing, worse towards the right. Mild retrolisthesis, greater on the right, with advanced right foraminal stenosis. Patent spinal canal L2-L3: Asymmetric degenerative disc narrowing, advanced on the right where there is far-lateral endplate spurring and disc bulging. Mild facet degenerative spurring. No evidence of impingement L3-L4: Advanced disc narrowing but only minimal annulus bulging. Facet spurring. No evidence of impingement L4-L5: Severe facet arthropathy with grade 1 anterolisthesis, joint distortion, and spurring. The disc is narrowed and mildly bulging. Bilateral recess  narrowing. Mild borderline moderate bilateral foraminal narrowing. L5-S1:No degenerative changes due to partial non segmentation. IMPRESSION: 1. No acute finding. 2. Levoscoliosis and advanced spinal degeneration as described. Electronically Signed    By: Monte Fantasia M.D.   On: 09/28/2016 15:06   Ct Biopsy  Result Date: 10/08/2016 INDICATION: 83 year old female with a nonspecific fatty mass in the right retroperitoneal space which might represent a benign lipoma or low grade liposarcoma. She presents for CT-guided biopsy. EXAM: CT BIOPSY MEDICATIONS: None. ANESTHESIA/SEDATION: Moderate (conscious) sedation was employed during this procedure. A total of Versed 2 mg and Fentanyl 50 mcg was administered intravenously. Moderate Sedation Time: 15 minutes. The patient's level of consciousness and vital signs were monitored continuously by radiology nursing throughout the procedure under my direct supervision. FLUOROSCOPY TIME:  Fluoroscopy Time: 0 minutes 0 seconds (0 mGy). COMPLICATIONS: None immediate. PROCEDURE: Informed written consent was obtained from the patient after a thorough discussion of the procedural risks, benefits and alternatives. All questions were addressed. Maximal Sterile Barrier Technique was utilized including caps, mask, sterile gowns, sterile gloves, sterile drape, hand hygiene and skin antiseptic. A timeout was performed prior to the initiation of the procedure. A planning axial CT scan was performed. The fatty mass in the right retroperitoneal space in the right lower quadrant was identified. A suitable skin entry site was selected and marked. Local anesthesia was attained by infiltration with 1% lidocaine. A small dermatotomy was made. Under intermittent CT guidance, a 17 gauge introducer needle was advanced into the fatty tissue. Multiple 18 gauge core biopsies were then coaxially obtained. Biopsy specimens were placed in formalin and delivered to pathology for further analysis. Post biopsy axial CT imaging demonstrates no evidence of complication. The patient tolerated the procedure well. IMPRESSION: Technically successful CT-guided core biopsy of right retroperitoneal fatty mass. Electronically Signed   By: Jacqulynn Cadet  M.D.   On: 10/08/2016 16:58   Ct Renal Stone Study  Result Date: 09/28/2016 CLINICAL DATA:  Left flank pain. Patient thinks she may have pulled a muscle. EXAM: CT ABDOMEN AND PELVIS WITHOUT CONTRAST TECHNIQUE: Multidetector CT imaging of the abdomen and pelvis was performed following the standard protocol without IV contrast. COMPARISON:  None. FINDINGS: Lower chest:  No contributory findings. Hepatobiliary: 18 mm subcapsular cyst in the right liver.Cholelithiasis. No evidence of cholecystitis. Pancreas: Unremarkable. Spleen: Unremarkable. Adrenals/Urinary Tract: Negative adrenals. No hydronephrosis or stone. 7 mm high-density area in the interpolar right kidney, usually a hemorrhagic cyst. Subcentimeter low-density in the upper pole right kidney is statistically a cyst. Unremarkable bladder. Stomach/Bowel: No obstruction or inflammation. Formed stool throughout most of the colon Vascular/Lymphatic: Extensive atherosclerotic calcification. No adenopathy. Reproductive:Hysterectomy.  Negative adnexae. Other: Discrete fatty mass in the right retroperitoneum, displacing bowel medially and extending from the iliac fossa to the level of the right lower liver. There are scattered internal calcifications. No internal solid components are noted. Mass measures approximately 20 cm in length and up to 4 x 10 cm in transaxial dimension. Fatty right inguinal hernia. Musculoskeletal: Remote bilateral obturator ring and right sacral ala fractures. Lumbar spine reformats described separately. No acute osseous finding. IMPRESSION: 1. No acute finding to explain left flank pain. No hydronephrosis or urolithiasis. 2. Large right retroperitoneal fatty mass extending from the pelvis to the diaphragm. 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 3. Cholelithiasis. 4. Moderate stool volume. Electronically Signed   By: Monte Fantasia M.D.   On: 09/28/2016 14:59    Microbiology: No results found  for  this or any previous visit (from the past 240 hour(s)).   Labs: Basic Metabolic Panel:  Recent Labs Lab 10/07/16 1845 10/09/16 0443  NA 129* 134*  K 4.0 3.6  CL 92* 93*  CO2 26 30  GLUCOSE 241* 164*  BUN 11 14  CREATININE 0.53 0.59  CALCIUM 9.1 9.1   Liver Function Tests:  Recent Labs Lab 10/07/16 1845  AST 16  ALT 8*  ALKPHOS 132*  BILITOT 1.0  PROT 6.7  ALBUMIN 3.5   No results for input(s): LIPASE, AMYLASE in the last 168 hours. No results for input(s): AMMONIA in the last 168 hours. CBC:  Recent Labs Lab 10/07/16 1845 10/08/16 0404 10/09/16 0443  WBC 10.1 8.6 7.9  HGB 14.7 14.5 14.7  HCT 41.8 41.5 43.0  MCV 88.6 87.7 89.0  PLT 255 268 267   Cardiac Enzymes: No results for input(s): CKTOTAL, CKMB, CKMBINDEX, TROPONINI in the last 168 hours. BNP: BNP (last 3 results) No results for input(s): BNP in the last 8760 hours.  ProBNP (last 3 results) No results for input(s): PROBNP in the last 8760 hours.  CBG:  Recent Labs Lab 10/09/16 0754 10/09/16 1200 10/09/16 1621 10/09/16 2004 10/10/16 0743  GLUCAP 191* 227* 103* 311* 136*       Signed:  Cristal Ford  [facility] 10/10/2016, 11:48 AM

## 2016-10-10 NOTE — Progress Notes (Signed)
Patient discharged to home, all discharge medications and instructions reviewed and questions answered. Patient to be assisted to vehicle by wheelchair when ride arrives.  

## 2016-10-12 DIAGNOSIS — R1900 Intra-abdominal and pelvic swelling, mass and lump, unspecified site: Secondary | ICD-10-CM | POA: Diagnosis not present

## 2016-10-12 DIAGNOSIS — E1165 Type 2 diabetes mellitus with hyperglycemia: Secondary | ICD-10-CM | POA: Diagnosis not present

## 2016-10-13 ENCOUNTER — Telehealth: Payer: Self-pay | Admitting: *Deleted

## 2016-10-13 NOTE — Telephone Encounter (Signed)
Lucita Ferrara called and left message that he was returning Dr. Grier Mitts call.  It would be easier if Dr. Irene Limbo called him at [phone].

## 2016-10-14 DIAGNOSIS — G8929 Other chronic pain: Secondary | ICD-10-CM | POA: Diagnosis not present

## 2016-10-14 DIAGNOSIS — M4120 Other idiopathic scoliosis, site unspecified: Secondary | ICD-10-CM | POA: Diagnosis not present

## 2016-10-14 DIAGNOSIS — M519 Unspecified thoracic, thoracolumbar and lumbosacral intervertebral disc disorder: Secondary | ICD-10-CM | POA: Diagnosis not present

## 2016-10-14 DIAGNOSIS — M1990 Unspecified osteoarthritis, unspecified site: Secondary | ICD-10-CM | POA: Diagnosis not present

## 2016-10-14 DIAGNOSIS — E119 Type 2 diabetes mellitus without complications: Secondary | ICD-10-CM | POA: Diagnosis not present

## 2016-10-14 DIAGNOSIS — R1900 Intra-abdominal and pelvic swelling, mass and lump, unspecified site: Secondary | ICD-10-CM | POA: Diagnosis not present

## 2016-10-19 DIAGNOSIS — M519 Unspecified thoracic, thoracolumbar and lumbosacral intervertebral disc disorder: Secondary | ICD-10-CM | POA: Diagnosis not present

## 2016-10-19 DIAGNOSIS — E119 Type 2 diabetes mellitus without complications: Secondary | ICD-10-CM | POA: Diagnosis not present

## 2016-10-19 DIAGNOSIS — M4120 Other idiopathic scoliosis, site unspecified: Secondary | ICD-10-CM | POA: Diagnosis not present

## 2016-10-19 DIAGNOSIS — G8929 Other chronic pain: Secondary | ICD-10-CM | POA: Diagnosis not present

## 2016-10-19 DIAGNOSIS — R1900 Intra-abdominal and pelvic swelling, mass and lump, unspecified site: Secondary | ICD-10-CM | POA: Diagnosis not present

## 2016-10-19 DIAGNOSIS — M1990 Unspecified osteoarthritis, unspecified site: Secondary | ICD-10-CM | POA: Diagnosis not present

## 2016-10-22 DIAGNOSIS — M4120 Other idiopathic scoliosis, site unspecified: Secondary | ICD-10-CM | POA: Diagnosis not present

## 2016-10-22 DIAGNOSIS — M519 Unspecified thoracic, thoracolumbar and lumbosacral intervertebral disc disorder: Secondary | ICD-10-CM | POA: Diagnosis not present

## 2016-10-22 DIAGNOSIS — G8929 Other chronic pain: Secondary | ICD-10-CM | POA: Diagnosis not present

## 2016-10-22 DIAGNOSIS — R1900 Intra-abdominal and pelvic swelling, mass and lump, unspecified site: Secondary | ICD-10-CM | POA: Diagnosis not present

## 2016-10-22 DIAGNOSIS — M1990 Unspecified osteoarthritis, unspecified site: Secondary | ICD-10-CM | POA: Diagnosis not present

## 2016-10-22 DIAGNOSIS — E119 Type 2 diabetes mellitus without complications: Secondary | ICD-10-CM | POA: Diagnosis not present

## 2016-10-25 DIAGNOSIS — M1990 Unspecified osteoarthritis, unspecified site: Secondary | ICD-10-CM | POA: Diagnosis not present

## 2016-10-25 DIAGNOSIS — M519 Unspecified thoracic, thoracolumbar and lumbosacral intervertebral disc disorder: Secondary | ICD-10-CM | POA: Diagnosis not present

## 2016-10-25 DIAGNOSIS — E119 Type 2 diabetes mellitus without complications: Secondary | ICD-10-CM | POA: Diagnosis not present

## 2016-10-25 DIAGNOSIS — R1900 Intra-abdominal and pelvic swelling, mass and lump, unspecified site: Secondary | ICD-10-CM | POA: Diagnosis not present

## 2016-10-25 DIAGNOSIS — G8929 Other chronic pain: Secondary | ICD-10-CM | POA: Diagnosis not present

## 2016-10-25 DIAGNOSIS — M4120 Other idiopathic scoliosis, site unspecified: Secondary | ICD-10-CM | POA: Diagnosis not present

## 2016-10-26 ENCOUNTER — Telehealth: Payer: Self-pay | Admitting: Hematology

## 2016-10-26 DIAGNOSIS — G8929 Other chronic pain: Secondary | ICD-10-CM | POA: Diagnosis not present

## 2016-10-26 DIAGNOSIS — R1900 Intra-abdominal and pelvic swelling, mass and lump, unspecified site: Secondary | ICD-10-CM | POA: Diagnosis not present

## 2016-10-26 DIAGNOSIS — M545 Low back pain: Secondary | ICD-10-CM | POA: Diagnosis not present

## 2016-10-26 NOTE — Telephone Encounter (Signed)
I called and discussed the results of the biopsy of the retroperitoneal mass which showed    We discussed that the biopsy shows mature adipose tissue. 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  We discussed that her left upper abdominal/chest wall pain does not seem to be related to her right retroperitoneal tumor and the tumor at this point is not causing overt symptoms .  Plan -The patient notes that she has been seen at [facility] [place] [facility] by Dr. Alwyn Pea MD she'll be discussing the case with Dr. Barry Dienes to determine if she is a surgical candidate and to determine the possibility and pros and cons of surgery. -She notes that they discussed likely taking a conservative approach and monitoring this given her age and other medical comorbidities. -We discussed seeing her back in about 2 months for a medical oncology clinic follow-up with labs. -Will defer follow-up imaging studies to surgery based on their treatment plan.  Tara Genera  MD MS

## 2016-10-27 ENCOUNTER — Telehealth: Payer: Self-pay | Admitting: Hematology

## 2016-10-27 ENCOUNTER — Other Ambulatory Visit: Payer: Self-pay | Admitting: Surgery

## 2016-10-27 DIAGNOSIS — R1900 Intra-abdominal and pelvic swelling, mass and lump, unspecified site: Secondary | ICD-10-CM | POA: Diagnosis not present

## 2016-10-27 DIAGNOSIS — M519 Unspecified thoracic, thoracolumbar and lumbosacral intervertebral disc disorder: Secondary | ICD-10-CM | POA: Diagnosis not present

## 2016-10-27 DIAGNOSIS — M1990 Unspecified osteoarthritis, unspecified site: Secondary | ICD-10-CM | POA: Diagnosis not present

## 2016-10-27 DIAGNOSIS — E119 Type 2 diabetes mellitus without complications: Secondary | ICD-10-CM | POA: Diagnosis not present

## 2016-10-27 DIAGNOSIS — G8929 Other chronic pain: Secondary | ICD-10-CM | POA: Diagnosis not present

## 2016-10-27 DIAGNOSIS — M4120 Other idiopathic scoliosis, site unspecified: Secondary | ICD-10-CM | POA: Diagnosis not present

## 2016-10-27 NOTE — Telephone Encounter (Signed)
Scheduled appt per sch message from [facility] 6/6 . Patient is aware of appt date and time and reminder letter sent.

## 2016-11-01 ENCOUNTER — Ambulatory Visit (INDEPENDENT_AMBULATORY_CARE_PROVIDER_SITE_OTHER): Payer: Medicare Other | Admitting: Physician Assistant

## 2016-11-01 ENCOUNTER — Encounter: Payer: Self-pay | Admitting: Physician Assistant

## 2016-11-01 VITALS — BP 124/64 | HR 84 | Ht 64.0 in | Wt 128.0 lb

## 2016-11-01 DIAGNOSIS — G8929 Other chronic pain: Secondary | ICD-10-CM | POA: Diagnosis not present

## 2016-11-01 DIAGNOSIS — M4120 Other idiopathic scoliosis, site unspecified: Secondary | ICD-10-CM | POA: Diagnosis not present

## 2016-11-01 DIAGNOSIS — Z01818 Encounter for other preprocedural examination: Secondary | ICD-10-CM | POA: Diagnosis not present

## 2016-11-01 DIAGNOSIS — M519 Unspecified thoracic, thoracolumbar and lumbosacral intervertebral disc disorder: Secondary | ICD-10-CM | POA: Diagnosis not present

## 2016-11-01 DIAGNOSIS — I10 Essential (primary) hypertension: Secondary | ICD-10-CM | POA: Diagnosis not present

## 2016-11-01 DIAGNOSIS — I481 Persistent atrial fibrillation: Secondary | ICD-10-CM | POA: Diagnosis not present

## 2016-11-01 DIAGNOSIS — Z7901 Long term (current) use of anticoagulants: Secondary | ICD-10-CM | POA: Diagnosis not present

## 2016-11-01 DIAGNOSIS — E119 Type 2 diabetes mellitus without complications: Secondary | ICD-10-CM | POA: Diagnosis not present

## 2016-11-01 DIAGNOSIS — I4819 Other persistent atrial fibrillation: Secondary | ICD-10-CM

## 2016-11-01 DIAGNOSIS — M1990 Unspecified osteoarthritis, unspecified site: Secondary | ICD-10-CM | POA: Diagnosis not present

## 2016-11-01 DIAGNOSIS — R1900 Intra-abdominal and pelvic swelling, mass and lump, unspecified site: Secondary | ICD-10-CM | POA: Diagnosis not present

## 2016-11-01 MED ORDER — METOPROLOL TARTRATE 25 MG PO TABS: ORAL_TABLET | ORAL | 3 refills | Status: DC

## 2016-11-01 NOTE — Progress Notes (Signed)
Cardiology Office Note   Date:  11/01/2016   ID:  Tara Bradley, DOB Mar 25, 1923, MRN [id number]  PCP:  [name], MD  Cardiologist:  Dr. Percival Spanish, 12/31/2015  [name], PA-C    History of Present Illness: Tara Bradley is a 83 y.o. female with a history of HTN, persistent A. fib on Xarelto, aortic valve sclerosis, HTN, HLD, DM, osteoporosis, CHADSVASC 5 (age x 2, female, DM, HTN)  Admit 5/17-5/22/2018 for left lateral pain, no cardiology concerns  Tara Bradley presents for cardiology evaluation and possible preop evaluation.  She went to the hospital for flank pain and a 4 x 10 cm mass was discovered in her R flank. She is undergoing evaluation, but if it is thought to be malignant, she may need surgery.   She has been very active, until 6 weeks ago. She then developed R flank pain, that switched to the L flank. This pain is still there when she moves around, is severe. She does not feel pain at rest. She feels this may be MS pain, she cares for her husband who has dementia. She has to help him get up out of chairs, etc. She also has significant scoliosis.   She never gets chest pain, despite exerting herself regularly. She has no LE edema, no orthopnea or PND.   She is rarely aware of a rapid HR or the atrial fib, but sometimes does feel her heart race. She has been told recently by medical staff that her HR is elevated. She is not generally aware of her heart beats.  She has not been light-headed or dizzy.   She has been started on insulin for her diabetes.   She had a stress test years ago that was ok, cannot remember exactly when. No report available in Epic.   Past Medical History:  Diagnosis Date  . Aortic valve sclerosis    Echo, 2008  . Arthritis   . Bradycardia    October, 2012  . Cancer ([place])   . Chest pain    Nuclear, April, 2008, no ischemia,  . Chronic insomnia    situational stress  . Closed fracture of unspecified part of femur  2005  . Diabetes mellitus, type 2 ([place])   . Diverticulitis   . Diverticulosis   . Ejection fraction    EF 60%, echo, February, 2008  //   EF 65-70%, echo, November, 2012  . Hyperlipidemia   . Hypertension   . Long term (current) use of anticoagulants   . Osteoporosis    femur fracture 2005, pelvic fracture 2006  . Persistent atrial fibrillation ([place])   . Personal history of malignant neoplasm of breast   . Syncope   . Unspecified closed fracture of pelvis 2006    Past Surgical History:  Procedure Laterality Date  . CARDIOVERSION N/A 08/09/2012   Procedure: CARDIOVERSION;  Surgeon: Deboraha Sprang, MD;  Location: [facility];  Service: Cardiovascular;  Laterality: N/A;  . EYE SURGERY    . FEMUR SURGERY  2005   ORIF  . MASTECTOMY  1995   left  . SHOULDER SURGERY     left  . [place], 2003  . TONSILLECTOMY    . TOTAL ABDOMINAL HYSTERECTOMY W/ BILATERAL SALPINGOOPHORECTOMY  1995  . WRIST SURGERY      x 2     Current Outpatient Prescriptions  Medication Sig Dispense Refill  . Ascorbic Acid (VITAMIN C PO) Take 1 tablet by mouth daily.    [name]  B Complex-C (B-COMPLEX WITH VITAMIN C) tablet Take 1 tablet by mouth daily.    . Calcium Carbonate-Vitamin D (CALTRATE 600+D) 600-400 MG-UNIT per tablet Take 1 tablet by mouth daily.      [name] CARTIA XT 300 MG 24 hr capsule TAKE 1 CAPSULE (300 MG TOTAL) BY MOUTH DAILY. 90 capsule 1  . Cholecalciferol (VITAMIN D) 2000 UNITS tablet Take 1,000 Units by mouth daily.     . diclofenac sodium (VOLTAREN) 1 % GEL Apply 2 g topically 4 (four) times daily. 100 g 0  . digoxin (DIGOX) 0.125 MG tablet Take 1 tablet (125 mcg total) by mouth daily. 90 tablet 3  . docusate sodium (COLACE) 100 MG capsule Take 1 capsule (100 mg total) by mouth 2 (two) times daily. 10 capsule 0  . insulin lispro (HUMALOG) 100 UNIT/ML injection Inject 10-14 Units into the skin 2 (two) times daily. 14 units before breakfast, 10 units at bedtime    .  losartan-hydrochlorothiazide (HYZAAR) 100-12.5 MG per tablet Take 1 tablet by mouth daily. 90 tablet 3  . Magnesium 250 MG TABS Take 1 tablet by mouth daily.      . metFORMIN (GLUMETZA) 500 MG (MOD) 24 hr tablet Take 1,000 mg by mouth every evening.    . metoprolol tartrate (LOPRESSOR) 25 MG tablet Take 2 tablets (66m) by mouth in the morning and 1 tablet in the evening. 270 tablet 3  . polyethylene glycol (MIRALAX / GLYCOLAX) packet Take 17 g by mouth daily. 14 each 0  . potassium chloride (K-DUR) 10 MEQ tablet     . Rivaroxaban (XARELTO) 15 MG TABS tablet Take 1 tablet (15 mg total) by mouth daily with supper. 90 tablet 2  . temazepam (RESTORIL) 15 MG capsule TAKE ONE CAPSULE AT BEDTIME AS NEEDED  5   No current facility-administered medications for this visit.     Allergies:   Ambien [zolpidem tartrate]; Codeine; and Codeine sulfate    Social History:  The patient  reports that she has never smoked. She has never used smokeless tobacco. She reports that she does not drink alcohol or use drugs.   Family History:  The patient's family history includes Congestive Heart Failure (age of onset: 862 in her mother; Diabetes type II in her brother, father, and son; Heart attack (age of onset: 810 in her father; Multiple myeloma in her brother.    ROS:  Please see the history of present illness. All other systems are reviewed and negative.    PHYSICAL EXAM: VS:  BP 124/64   Pulse 84   Ht 5' 4" (1.626 m)   Wt 128 lb (58.1 kg)   BMI 21.97 kg/m  , BMI Body mass index is 21.97 kg/m. GEN: Well nourished, well developed, female in no acute distress  HEENT: normal for age  Neck: no JVD, no carotid bruit, no masses Cardiac: Irreg R&R; 2/6 murmur, no rubs, or gallops Respiratory:  clear to auscultation bilaterally, normal work of breathing GI: soft, nontender, nondistended, + BS MS: no deformity or atrophy; no edema; distal pulses are 2+ in all 4 extremities   Skin: warm and dry, no  rash Neuro:  Strength and sensation are intact Psych: euthymic mood, full affect   EKG:  EKG is ordered today. The ekg ordered today demonstrates atrial fib, controlled VR  ECHO: 04/01/2011 - Left ventricle: The cavity size was normal. Wall thickness was normal. Systolic function was vigorous. The estimated ejection fraction was in the range of 65% to 70%. Wall  motion was normal; there were no regional wall motion abnormalities. - Mitral valve: Calcified annulus. Mildly thickened leaflets. - Left atrium: The atrium was mildly dilated.   Recent Labs: 10/07/2016: ALT 8 10/09/2016: BUN 14; Creatinine, Ser 0.59; Hemoglobin 14.7; Platelets 267; Potassium 3.6; Sodium 134    Lipid Panel    Component Value Date/Time   CHOL 155 06/21/2011 1143   TRIG 158.0 (H) 06/21/2011 1143   HDL 56.90 06/21/2011 1143   CHOLHDL 3 06/21/2011 1143   VLDL 31.6 06/21/2011 1143   LDLCALC 67 06/21/2011 1143   LDLDIRECT 37 03/10/2009 1106     Wt Readings from Last 3 Encounters:  11/01/16 128 lb (58.1 kg)  10/08/16 135 lb (61.2 kg)  09/28/16 135 lb (61.2 kg)     Other studies Reviewed: Additional studies/ records that were reviewed today include: office notes, hospital records and testing.  ASSESSMENT AND PLAN:  1.  Preop evaluation: Her activity level has been good in the past, but has been limited recently the pain in her side. We have no record stress test, she is not sure how long ago was. She has no history of ischemic symptoms, but if she needs surgery we should get a preoperative stress test. The patient was advised to contact us if she needs surgery we will schedule this.  She is not having any ongoing ischemic symptoms, so we'll reserve this stress test for preoperative testing.  2. Persistent atrial fibrillation: Her heart rate is controlled in the office today, but she states she has been told several times recently that her heart rate is elevated. She is under both physical and  emotional stress from the pain in her back and side and the stress of caring for her husband. I will increase the morning metoprolol from 25 up to 50 mg, keep the evening dose at 25 mg daily. She understands that if she does not tolerate this, she can try one and a half tablets of the metoprolol instead of 2. She will call us if there are any problems.  3. Chronic anticoagulation: She is not having any bleeding issues on the Xarelto.  4. Hypertension: In addition to the metoprolol, she is taking Cardizem CD 300 mg a day and losartan HCTZ 100/12.5 mg daily. If her heart rate is improved but her blood pressure does not tolerate the increased beta blocker, consider decreasing the losartan.    Current medicines are reviewed at length with the patient today.  The patient does not have concerns regarding medicines.  The following changes have been made:  Increase metoprolol  Labs/ tests ordered today include:   Orders Placed This Encounter  Procedures  . EKG 12-Lead     Disposition:   FU with Dr. Percival Spanish  Signed, [name], PA-C  11/01/2016 12:26 PM    [address] Phone: [phone]; Fax: [phone]  This note was written with the assistance of speech recognition software. Please excuse any transcriptional errors.

## 2016-11-01 NOTE — Patient Instructions (Addendum)
Your physician has recommended you make the following change in your medication:  -- INCREASE metoprolol to TWO tablets in the morning and ONE tablet in the evening  -- if you don't tolerate, try 1.5 tablets in the morning and 1 in the evening  -- you can take scheduled Tylenol - for example: 2 tablets in the morning and 2 tablets at lunch  Your physician recommends that you schedule a follow-up appointment with Dr. Percival Spanish - first available.   Please call our office if you are planning to have surgery - will likely need a stress test for cardiac clearance

## 2016-11-03 DIAGNOSIS — M4120 Other idiopathic scoliosis, site unspecified: Secondary | ICD-10-CM | POA: Diagnosis not present

## 2016-11-03 DIAGNOSIS — E119 Type 2 diabetes mellitus without complications: Secondary | ICD-10-CM | POA: Diagnosis not present

## 2016-11-03 DIAGNOSIS — M1990 Unspecified osteoarthritis, unspecified site: Secondary | ICD-10-CM | POA: Diagnosis not present

## 2016-11-03 DIAGNOSIS — R1900 Intra-abdominal and pelvic swelling, mass and lump, unspecified site: Secondary | ICD-10-CM | POA: Diagnosis not present

## 2016-11-03 DIAGNOSIS — G8929 Other chronic pain: Secondary | ICD-10-CM | POA: Diagnosis not present

## 2016-11-03 DIAGNOSIS — M519 Unspecified thoracic, thoracolumbar and lumbosacral intervertebral disc disorder: Secondary | ICD-10-CM | POA: Diagnosis not present

## 2016-11-11 ENCOUNTER — Ambulatory Visit
Admission: RE | Admit: 2016-11-11 | Discharge: 2016-11-11 | Disposition: A | Discharge status: Home / Self Care | Payer: Medicare Other | Source: Ambulatory Visit | Attending: Surgery | Admitting: Surgery

## 2016-11-11 DIAGNOSIS — R1900 Intra-abdominal and pelvic swelling, mass and lump, unspecified site: Secondary | ICD-10-CM

## 2016-11-11 MED ORDER — GADOBENATE DIMEGLUMINE 529 MG/ML IV SOLN
11.0000 mL | Freq: Once | INTRAVENOUS | Status: AC | PRN
  Administered 2016-11-11: 11 mL via INTRAVENOUS

## 2016-12-01 ENCOUNTER — Ambulatory Visit (HOSPITAL_COMMUNITY)
Admission: RE | Admit: 2016-12-01 | Discharge: 2016-12-01 | Disposition: A | Discharge status: Home / Self Care | Payer: Medicare Other | Source: Ambulatory Visit | Attending: Nurse Practitioner | Admitting: Nurse Practitioner

## 2016-12-01 ENCOUNTER — Telehealth: Payer: Self-pay | Admitting: Cardiology

## 2016-12-01 ENCOUNTER — Encounter (HOSPITAL_COMMUNITY): Payer: Self-pay | Admitting: Nurse Practitioner

## 2016-12-01 VITALS — BP 162/86 | HR 97 | Ht 64.0 in | Wt 133.0 lb

## 2016-12-01 DIAGNOSIS — F5104 Psychophysiologic insomnia: Secondary | ICD-10-CM | POA: Diagnosis not present

## 2016-12-01 DIAGNOSIS — Z853 Personal history of malignant neoplasm of breast: Secondary | ICD-10-CM | POA: Diagnosis not present

## 2016-12-01 DIAGNOSIS — E119 Type 2 diabetes mellitus without complications: Secondary | ICD-10-CM | POA: Insufficient documentation

## 2016-12-01 DIAGNOSIS — Z794 Long term (current) use of insulin: Secondary | ICD-10-CM | POA: Diagnosis not present

## 2016-12-01 DIAGNOSIS — I482 Chronic atrial fibrillation, unspecified: Secondary | ICD-10-CM

## 2016-12-01 DIAGNOSIS — E785 Hyperlipidemia, unspecified: Secondary | ICD-10-CM | POA: Diagnosis not present

## 2016-12-01 DIAGNOSIS — I10 Essential (primary) hypertension: Secondary | ICD-10-CM | POA: Diagnosis not present

## 2016-12-01 DIAGNOSIS — I481 Persistent atrial fibrillation: Secondary | ICD-10-CM | POA: Diagnosis not present

## 2016-12-01 DIAGNOSIS — Z7901 Long term (current) use of anticoagulants: Secondary | ICD-10-CM | POA: Insufficient documentation

## 2016-12-01 MED ORDER — METOPROLOL TARTRATE 25 MG PO TABS
25.0000 mg | ORAL_TABLET | Freq: Two times a day (BID) | ORAL | 3 refills | Status: DC
Start: 2016-12-01 — End: 2017-03-18

## 2016-12-01 NOTE — Patient Instructions (Signed)
Your physician has recommended you make the following change in your medication:  1)Decrease metoprolol to 25mg twice a day  

## 2016-12-01 NOTE — Telephone Encounter (Signed)
New message     Pt c/o Shortness Of Breath: STAT if SOB developed within the last 24 hours or pt is noticeably SOB on the phone  1. Are you currently SOB (can you hear that pt is SOB on the phone)?  A little   2. How long have you been experiencing SOB?  A week   3. Are you SOB when sitting or when up moving around? Moving around   4. Are you currently experiencing any other symptoms? No other symptoms - this started when she started taking 2 of the metoprolol - her husband passed away on 12/24/2022 , not sure if that is a factor too

## 2016-12-01 NOTE — Telephone Encounter (Signed)
Returned call to patient-patient reports she has had increased SOB x 1-2 weeks.  Reports she saw [name] PA on 6/11 and her medication was changed.   Reports she felt very fatigued and dizzy with the medication change, therefore decreased her metoprolol as instructed to 1.5 tablets in the AM and 1 tablet in the PM (25mg  tablets).    Reports she is SOB with exertion and at rest, reports she can get SOB just talking on the phone (minor SOB noted speaking with patient, able to speak in complete sentences).  Reports her HR has been running 114-119, BP 140/80.   Reports her husband passed away on Dec 15, 2022, so she has been under a lot of stress as well.  Denies CP, dizziness, lightheadedness.    Per chart review:  Hx of Afib, on xarelto, htn, aortic valve sclerosis, HLD.  Also, taking cardizem CD 300mg  daily and losartan/hctz 100/12.5 daily.    Called Afib clinic-ok to add on at 2:30, no available appointments at [facility] or [facility].    Location and parking direction given to patient, patient aware and verbalized understanding.

## 2016-12-01 NOTE — Progress Notes (Signed)
Primary Care Physician: Tara Jordan, MD Referring Physician: Juanda Bradley triage Cardiologist: Dr. Madlyn Bradley is a 83 y.o. female with a h/o persistent afib.She has been rate controlled. She saw Tara Ferries, PA, for surgical clearance, for a flank mass. Pt had reported that her heart rate at home had been elevated in the 112-114 bpm range. She was 84 bpm in the office. It was suggested to increase metoprolol tartrate to 50 mg am and continue with 25 mg pm. Since that time, she has felt lightheaded and short of breath.  She denies any PND.orthopnea. Weight is up a few lbs from last visit, but down 2 lbs from the  month before. She found out that the mass was benign so she will not need to have surgery.  Today, she denies symptoms of palpitations, chest pain, orthopnea, PND, lower extremity edema, dizziness, presyncope, syncope, or neurologic sequela. Positive for shortness of breath, fatigue. The patient is tolerating medications without difficulties and is otherwise without complaint today.   Past Medical History:  Diagnosis Date  . Aortic valve sclerosis    Echo, 2008  . Arthritis   . Bradycardia    October, 2012  . Cancer ([place])   . Chest pain    Nuclear, April, 2008, no ischemia,  . Chronic insomnia    situational stress  . Closed fracture of unspecified part of femur 2005  . Diabetes mellitus, type 2 ([place])   . Diverticulitis   . Diverticulosis   . Ejection fraction    EF 60%, echo, February, 2008  //   EF 65-70%, echo, November, 2012  . Hyperlipidemia   . Hypertension   . Long term (current) use of anticoagulants   . Osteoporosis    femur fracture 2005, pelvic fracture 2006  . Persistent atrial fibrillation ([place])   . Personal history of malignant neoplasm of breast   . Syncope   . Unspecified closed fracture of pelvis 2006   Past Surgical History:  Procedure Laterality Date  . CARDIOVERSION N/A 08/09/2012   Procedure: CARDIOVERSION;  Surgeon: Deboraha Sprang, MD;  Location: [facility];  Service: Cardiovascular;  Laterality: N/A;  . EYE SURGERY    . FEMUR SURGERY  2005   ORIF  . MASTECTOMY  1995   left  . SHOULDER SURGERY     left  . [place], 2003  . TONSILLECTOMY    . TOTAL ABDOMINAL HYSTERECTOMY W/ BILATERAL SALPINGOOPHORECTOMY  1995  . WRIST SURGERY      x 2     Current Outpatient Prescriptions  Medication Sig Dispense Refill  . Ascorbic Acid (VITAMIN C PO) Take 1 tablet by mouth daily.    . B Complex-C (B-COMPLEX WITH VITAMIN C) tablet Take 1 tablet by mouth daily.    . Calcium Carbonate-Vitamin D (CALTRATE 600+D) 600-400 MG-UNIT per tablet Take 1 tablet by mouth daily.      [name] CARTIA XT 300 MG 24 hr capsule TAKE 1 CAPSULE (300 MG TOTAL) BY MOUTH DAILY. 90 capsule 1  . Cholecalciferol (VITAMIN D) 2000 UNITS tablet Take 1,000 Units by mouth daily.     . diclofenac sodium (VOLTAREN) 1 % GEL Apply 2 g topically 4 (four) times daily. 100 g 0  . digoxin (DIGOX) 0.125 MG tablet Take 1 tablet (125 mcg total) by mouth daily. 90 tablet 3  . docusate sodium (COLACE) 100 MG capsule Take 1 capsule (100 mg total) by mouth 2 (two) times daily. 10 capsule  0  . insulin lispro (HUMALOG) 100 UNIT/ML injection Inject 10-14 Units into the skin 2 (two) times daily. 14 units before breakfast, 10 units at bedtime    . losartan-hydrochlorothiazide (HYZAAR) 100-12.5 MG per tablet Take 1 tablet by mouth daily. 90 tablet 3  . Magnesium 250 MG TABS Take 1 tablet by mouth daily.      . metFORMIN (GLUMETZA) 500 MG (MOD) 24 hr tablet Take 1,000 mg by mouth every evening.    . metoprolol tartrate (LOPRESSOR) 25 MG tablet Take 1 tablet (25 mg total) by mouth 2 (two) times daily. 270 tablet 3  . polyethylene glycol (MIRALAX / GLYCOLAX) packet Take 17 g by mouth daily. 14 each 0  . potassium chloride (K-DUR) 10 MEQ tablet Take 10 mEq by mouth daily.     . Rivaroxaban (XARELTO) 15 MG TABS tablet Take 1 tablet (15 mg total) by mouth daily with  supper. 90 tablet 2  . temazepam (RESTORIL) 15 MG capsule TAKE ONE CAPSULE AT BEDTIME AS NEEDED  5   No current facility-administered medications for this encounter.     Allergies  Allergen Reactions  . Ambien [Zolpidem Tartrate]     Hallucinations   . Codeine Other (See Comments)    headache  . Codeine Sulfate     REACTION: unspecified    Social History   Social History  . Marital status: Married    Spouse name: N/A  . Number of children: 2  . Years of education: N/A   Occupational History  . Piano Teacher    Social History Main Topics  . Smoking status: Never Smoker  . Smokeless tobacco: Never Used  . Alcohol use No  . Drug use: No  . Sexual activity: Not Currently   Other Topics Concern  . Not on file   Social History Narrative   Husband has advancing Dementia, which has been very difficult for her.    Family History  Problem Relation Age of Onset  . Congestive Heart Failure Mother 15  . Heart attack Father 38  . Diabetes type II Father   . Diabetes type II Brother        1/2  . Multiple myeloma Brother        2/2  . Diabetes type II Son   . Colon cancer Neg Hx     ROS- All systems are reviewed and negative except as per the HPI above  Physical Exam: Vitals:   12/01/16 1439  BP: (!) 162/86  Pulse: 97  SpO2: 94%  Weight: 133 lb (60.3 kg)  Height: '5\' 4"'$  (1.626 m)   Wt Readings from Last 3 Encounters:  12/01/16 133 lb (60.3 kg)  11/01/16 128 lb (58.1 kg)  10/08/16 135 lb (61.2 kg)    Labs: Lab Results  Component Value Date   NA 134 (L) 10/09/2016   K 3.6 10/09/2016   CL 93 (L) 10/09/2016   CO2 30 10/09/2016   GLUCOSE 164 (H) 10/09/2016   BUN 14 10/09/2016   CREATININE 0.59 10/09/2016   CALCIUM 9.1 10/09/2016   MG 2.3 01/09/2008   Lab Results  Component Value Date   INR 1.17 10/07/2016   Lab Results  Component Value Date   CHOL 155 06/21/2011   HDL 56.90 06/21/2011   LDLCALC 67 06/21/2011   TRIG 158.0 (H) 06/21/2011      GEN- The patient is well appearing, alert and oriented x 3 today.   Head- normocephalic, atraumatic Eyes-  Sclera clear, conjunctiva pink  Ears- hearing intact Oropharynx- clear Neck- supple, no JVP Lymph- no cervical lymphadenopathy Lungs- Clear to ausculation bilaterally, normal work of breathing Heart- irregular rate and rhythm, no murmurs, rubs or gallops, PMI not laterally displaced GI- soft, NT, ND, + BS Extremities- no clubbing, cyanosis, or edema MS- no significant deformity or atrophy Skin- no rash or lesion Psych- euthymic mood, full affect Neuro- strength and sensation are intact  EKG-afib at 97 bpm, qrs int 84 bpm, qtc 378 ms Epic records reviewed   Assessment and Plan: 1. Persistent afib Pt has not been feeling well since metoprolol increased Will reduce to prior dose, metoprolol tartrate at 25 mg bid  Continue  Cartia xt 300 mg daily Continue xarelto 15 mg daily for a chadsvasc score of at least 5  She will follow up in one week If she still feels poorly and heart rate needs adjustment will try Cardizem 180 mg bid  Tara Bradley, [facility] [address] [place], [place] [address] [phone]

## 2016-12-08 ENCOUNTER — Ambulatory Visit (HOSPITAL_COMMUNITY)
Admission: RE | Admit: 2016-12-08 | Discharge: 2016-12-08 | Disposition: A | Discharge status: Home / Self Care | Payer: Medicare Other | Source: Ambulatory Visit | Attending: Nurse Practitioner | Admitting: Nurse Practitioner

## 2016-12-08 ENCOUNTER — Encounter (HOSPITAL_COMMUNITY): Payer: Self-pay | Admitting: Nurse Practitioner

## 2016-12-08 VITALS — BP 140/72 | HR 91 | Wt 133.5 lb

## 2016-12-08 DIAGNOSIS — I481 Persistent atrial fibrillation: Secondary | ICD-10-CM | POA: Diagnosis not present

## 2016-12-08 DIAGNOSIS — I482 Chronic atrial fibrillation, unspecified: Secondary | ICD-10-CM

## 2016-12-08 DIAGNOSIS — I10 Essential (primary) hypertension: Secondary | ICD-10-CM | POA: Diagnosis not present

## 2016-12-08 DIAGNOSIS — E119 Type 2 diabetes mellitus without complications: Secondary | ICD-10-CM | POA: Diagnosis not present

## 2016-12-08 DIAGNOSIS — E785 Hyperlipidemia, unspecified: Secondary | ICD-10-CM | POA: Diagnosis not present

## 2016-12-08 DIAGNOSIS — Z7901 Long term (current) use of anticoagulants: Secondary | ICD-10-CM | POA: Insufficient documentation

## 2016-12-08 DIAGNOSIS — Z794 Long term (current) use of insulin: Secondary | ICD-10-CM | POA: Insufficient documentation

## 2016-12-08 DIAGNOSIS — Z853 Personal history of malignant neoplasm of breast: Secondary | ICD-10-CM | POA: Insufficient documentation

## 2016-12-08 DIAGNOSIS — Z79899 Other long term (current) drug therapy: Secondary | ICD-10-CM | POA: Insufficient documentation

## 2016-12-08 DIAGNOSIS — I4891 Unspecified atrial fibrillation: Secondary | ICD-10-CM | POA: Diagnosis present

## 2016-12-08 NOTE — Progress Notes (Signed)
Primary Care Physician: Tara Jordan, MD Referring Physician: Juanda Bradley triage Cardiologist: Dr. Madlyn Bradley is a 83 y.o. female with a h/o persistent afib.She has been rate controlled. She saw Tara Ferries, PA, for surgical clearance, for a flank mass. Pt had reported that her heart rate at home had been elevated in the 112-114 bpm range. She was 84 bpm in the office. It was suggested to increase metoprolol tartrate to 50 mg am and continue with 25 mg pm. Since that time, she has felt lightheaded and short of breath.  She denies any PND.orthopnea. Weight is up a few lbs from last visit, but down 2 lbs from the  month before. She found out that the mass was benign so she will not need to have surgery.  F/u in afib clinic 7/18. She went back to usual metoprolol dose and feels better. She states that the elevated Heart Rates that she was reporting to Tara Bradley are from when she checks V/S on first thing on down without allowing for any rest. Overall she feels well.  Today, she denies symptoms of palpitations, chest pain, orthopnea, PND, lower extremity edema, dizziness, presyncope, syncope, or neurologic sequela. Positive for shortness of breath, fatigue. The patient is tolerating medications without difficulties and is otherwise without complaint today.   Past Medical History:  Diagnosis Date  . Aortic valve sclerosis    Echo, 2008  . Arthritis   . Bradycardia    October, 2012  . Cancer ([place])   . Chest pain    Nuclear, April, 2008, no ischemia,  . Chronic insomnia    situational stress  . Closed fracture of unspecified part of femur 2005  . Diabetes mellitus, type 2 ([place])   . Diverticulitis   . Diverticulosis   . Ejection fraction    EF 60%, echo, February, 2008  //   EF 65-70%, echo, November, 2012  . Hyperlipidemia   . Hypertension   . Long term (current) use of anticoagulants   . Osteoporosis    femur fracture 2005, pelvic fracture 2006  . Persistent atrial  fibrillation ([place])   . Personal history of malignant neoplasm of breast   . Syncope   . Unspecified closed fracture of pelvis 2006   Past Surgical History:  Procedure Laterality Date  . CARDIOVERSION N/A 08/09/2012   Procedure: CARDIOVERSION;  Surgeon: Deboraha Sprang, MD;  Location: [facility];  Service: Cardiovascular;  Laterality: N/A;  . EYE SURGERY    . FEMUR SURGERY  2005   ORIF  . MASTECTOMY  1995   left  . SHOULDER SURGERY     left  . [place], 2003  . TONSILLECTOMY    . TOTAL ABDOMINAL HYSTERECTOMY W/ BILATERAL SALPINGOOPHORECTOMY  1995  . WRIST SURGERY      x 2     Current Outpatient Prescriptions  Medication Sig Dispense Refill  . Ascorbic Acid (VITAMIN C PO) Take 1 tablet by mouth daily.    . B Complex-C (B-COMPLEX WITH VITAMIN C) tablet Take 1 tablet by mouth daily.    . Calcium Carbonate-Vitamin D (CALTRATE 600+D) 600-400 MG-UNIT per tablet Take 1 tablet by mouth daily.      [name] CARTIA XT 300 MG 24 hr capsule TAKE 1 CAPSULE (300 MG TOTAL) BY MOUTH DAILY. 90 capsule 1  . Cholecalciferol (VITAMIN D) 2000 UNITS tablet Take 1,000 Units by mouth daily.     . diclofenac sodium (VOLTAREN) 1 % GEL Apply 2 g  topically 4 (four) times daily. 100 g 0  . digoxin (DIGOX) 0.125 MG tablet Take 1 tablet (125 mcg total) by mouth daily. 90 tablet 3  . docusate sodium (COLACE) 100 MG capsule Take 1 capsule (100 mg total) by mouth 2 (two) times daily. 10 capsule 0  . insulin lispro (HUMALOG) 100 UNIT/ML injection Inject 10-14 Units into the skin 2 (two) times daily. 14 units before breakfast, 10 units at bedtime    . losartan-hydrochlorothiazide (HYZAAR) 100-12.5 MG per tablet Take 1 tablet by mouth daily. 90 tablet 3  . Magnesium 250 MG TABS Take 1 tablet by mouth daily.      . metFORMIN (GLUMETZA) 500 MG (MOD) 24 hr tablet Take 1,000 mg by mouth every evening.    . metoprolol tartrate (LOPRESSOR) 25 MG tablet Take 1 tablet (25 mg total) by mouth 2 (two) times daily. 270  tablet 3  . polyethylene glycol (MIRALAX / GLYCOLAX) packet Take 17 g by mouth daily. 14 each 0  . potassium chloride (K-DUR) 10 MEQ tablet Take 10 mEq by mouth daily.     . Rivaroxaban (XARELTO) 15 MG TABS tablet Take 1 tablet (15 mg total) by mouth daily with supper. 90 tablet 2  . temazepam (RESTORIL) 15 MG capsule TAKE ONE CAPSULE AT BEDTIME AS NEEDED  5   No current facility-administered medications for this encounter.     Allergies  Allergen Reactions  . Ambien [Zolpidem Tartrate]     Hallucinations   . Codeine Other (See Comments)    headache  . Codeine Sulfate     REACTION: unspecified    Social History   Social History  . Marital status: Married    Spouse name: N/A  . Number of children: 2  . Years of education: N/A   Occupational History  . Piano Teacher    Social History Main Topics  . Smoking status: Never Smoker  . Smokeless tobacco: Never Used  . Alcohol use No  . Drug use: No  . Sexual activity: Not Currently   Other Topics Concern  . Not on file   Social History Narrative   Husband has advancing Dementia, which has been very difficult for her.    Family History  Problem Relation Age of Onset  . Congestive Heart Failure Mother 27  . Heart attack Father 96  . Diabetes type II Father   . Diabetes type II Brother        1/2  . Multiple myeloma Brother        2/2  . Diabetes type II Son   . Colon cancer Neg Hx     ROS- All systems are reviewed and negative except as per the HPI above  Physical Exam: Vitals:   12/08/16 1542  BP: 140/72  Pulse: 91  SpO2: 96%  Weight: 133 lb 8 oz (60.6 kg)   Wt Readings from Last 3 Encounters:  12/08/16 133 lb 8 oz (60.6 kg)  12/01/16 133 lb (60.3 kg)  11/01/16 128 lb (58.1 kg)    Labs: Lab Results  Component Value Date   NA 134 (L) 10/09/2016   K 3.6 10/09/2016   CL 93 (L) 10/09/2016   CO2 30 10/09/2016   GLUCOSE 164 (H) 10/09/2016   BUN 14 10/09/2016   CREATININE 0.59 10/09/2016   CALCIUM  9.1 10/09/2016   MG 2.3 01/09/2008   Lab Results  Component Value Date   INR 1.17 10/07/2016   Lab Results  Component Value Date  CHOL 155 06/21/2011   HDL 56.90 06/21/2011   LDLCALC 67 06/21/2011   TRIG 158.0 (H) 06/21/2011     GEN- The patient is well appearing, alert and oriented x 3 today.   Head- normocephalic, atraumatic Eyes-  Sclera clear, conjunctiva pink Ears- hearing intact Oropharynx- clear Neck- supple, no JVP Lymph- no cervical lymphadenopathy Lungs- Clear to ausculation bilaterally, normal work of breathing Heart- irregular rate and rhythm, no murmurs, rubs or gallops, PMI not laterally displaced GI- soft, NT, ND, + BS Extremities- no clubbing, cyanosis, or edema MS- no significant deformity or atrophy Skin- no rash or lesion Psych- euthymic mood, full affect Neuro- strength and sensation are intact  EKG-afib at 91 bpm, qrs int 84 bpm, qtc 408 ms Epic records reviewed   Assessment and Plan: 1. Persistent afib Pt did not feel  well since metoprolol increased, feels better since returning back to lower dose of metoprolol Pt will check V/S after 10-15 mins of rest and report next week Continue  Cartia xt 300 mg daily Continue xarelto 15 mg daily for a chadsvasc score of at least 5  Phone follow up in one week If  heart rate needs adjustment will try Cardizem 180 mg bid  Butch Penny C. , [facility] [name] [place], [place] [address] [phone]

## 2016-12-10 ENCOUNTER — Other Ambulatory Visit: Payer: Self-pay

## 2016-12-10 ENCOUNTER — Telehealth: Payer: Self-pay | Admitting: *Deleted

## 2016-12-10 MED ORDER — DIGOXIN 125 MCG PO TABS: 125.0000 ug | ORAL_TABLET | Freq: Every day | ORAL | 3 refills | Status: DC

## 2016-12-10 NOTE — Telephone Encounter (Signed)
Pt called stating she has followed up with surgery and they have reviewed her MRI with her and stated she does not need surgery at this time.  Pt questioned if she needs to keep oncology apt.  Reviewed with Dr. Irene Limbo, if patient is following up with surgery, she does not need to see oncology if she does not want to.  Pt stated she has f/u with surgery, no surgery needed at this time, will f/u with surgery in 1 year, does not wish to keep oncology apt.  Advised pt to let us know if anything comes up in the mean time and we will be happy to see her.  Pt verbalized understanding.

## 2016-12-17 ENCOUNTER — Telehealth (HOSPITAL_COMMUNITY): Payer: Self-pay | Admitting: *Deleted

## 2016-12-17 NOTE — Telephone Encounter (Signed)
Pt called in with HR readings since last appt per donna's request: Thursday--97 Friday--114 (before meds), 64 (after meds) Saturday--97 Sunday--75, 93 Monday--72 Tuesday--68

## 2016-12-20 NOTE — Telephone Encounter (Signed)
No medication changes needed at this time per Roderic Palau, NP

## 2016-12-27 ENCOUNTER — Ambulatory Visit: Payer: Medicare Other | Admitting: Hematology

## 2016-12-27 ENCOUNTER — Other Ambulatory Visit: Payer: Self-pay | Admitting: Family Medicine

## 2016-12-27 ENCOUNTER — Other Ambulatory Visit: Payer: Medicare Other

## 2016-12-27 DIAGNOSIS — Z1231 Encounter for screening mammogram for malignant neoplasm of breast: Secondary | ICD-10-CM

## 2016-12-28 ENCOUNTER — Ambulatory Visit
Admission: RE | Admit: 2016-12-28 | Discharge: 2016-12-28 | Disposition: A | Discharge status: Home / Self Care | Payer: Medicare Other | Source: Ambulatory Visit | Attending: Family Medicine | Admitting: Family Medicine

## 2016-12-28 DIAGNOSIS — Z1231 Encounter for screening mammogram for malignant neoplasm of breast: Secondary | ICD-10-CM | POA: Diagnosis not present

## 2016-12-31 DIAGNOSIS — Z8781 Personal history of (healed) traumatic fracture: Secondary | ICD-10-CM | POA: Diagnosis not present

## 2016-12-31 DIAGNOSIS — M810 Age-related osteoporosis without current pathological fracture: Secondary | ICD-10-CM | POA: Diagnosis not present

## 2016-12-31 DIAGNOSIS — Z794 Long term (current) use of insulin: Secondary | ICD-10-CM | POA: Diagnosis not present

## 2016-12-31 DIAGNOSIS — E119 Type 2 diabetes mellitus without complications: Secondary | ICD-10-CM | POA: Diagnosis not present

## 2017-01-09 NOTE — Progress Notes (Signed)
HPI The patient presents for followup of atrial fibrillation.  Recently she has been seen for evaluation of rate control.  She has not tolerated increased beta blocker.  She was seen in the atrial fib clinic.  She reports that she was having pain probably from old muscle. She was also under stress because her husband had stroke and subsequently died in 01/07/2023. Now her BP is much better controlled.  The patient denies any new symptoms such as chest discomfort, neck or arm discomfort. There has been no new shortness of breath, PND or orthopnea. There have been no reported palpitations, presyncope or syncope.  She does have chronic back pain.   Allergies  Allergen Reactions  . Ambien [Zolpidem Tartrate]     Hallucinations   . Codeine Other (See Comments)    headache  . Codeine Sulfate     REACTION: unspecified    Current Outpatient Prescriptions  Medication Sig Dispense Refill  . Ascorbic Acid (VITAMIN C PO) Take 1 tablet by mouth daily.    . B Complex-C (B-COMPLEX WITH VITAMIN C) tablet Take 1 tablet by mouth daily.    . Calcium Carbonate-Vitamin D (CALTRATE 600+D) 600-400 MG-UNIT per tablet Take 1 tablet by mouth daily.      [name] CARTIA XT 300 MG 24 hr capsule TAKE 1 CAPSULE (300 MG TOTAL) BY MOUTH DAILY. 90 capsule 1  . Cholecalciferol (VITAMIN D) 2000 UNITS tablet Take 1,000 Units by mouth daily.     . diclofenac sodium (VOLTAREN) 1 % GEL Apply 2 g topically 4 (four) times daily. 100 g 0  . digoxin (DIGOX) 0.125 MG tablet Take 1 tablet (125 mcg total) by mouth daily. 90 tablet 3  . docusate sodium (COLACE) 100 MG capsule Take 1 capsule (100 mg total) by mouth 2 (two) times daily. 10 capsule 0  . insulin lispro (HUMALOG) 100 UNIT/ML injection Inject 10-14 Units into the skin 2 (two) times daily. 7 units before breakfast, 5 units at bedtime    . losartan-hydrochlorothiazide (HYZAAR) 100-12.5 MG per tablet Take 1 tablet by mouth daily. 90 tablet 3  . Magnesium 250 MG TABS Take 1 tablet by  mouth daily.      . metFORMIN (GLUMETZA) 500 MG (MOD) 24 hr tablet Take 1,000 mg by mouth every evening.    . metoprolol tartrate (LOPRESSOR) 25 MG tablet Take 1 tablet (25 mg total) by mouth 2 (two) times daily. 270 tablet 3  . polyethylene glycol (MIRALAX / GLYCOLAX) packet Take 17 g by mouth daily. 14 each 0  . potassium chloride (K-DUR) 10 MEQ tablet Take 10 mEq by mouth daily.     . Rivaroxaban (XARELTO) 15 MG TABS tablet Take 1 tablet (15 mg total) by mouth daily with supper. 90 tablet 2  . temazepam (RESTORIL) 15 MG capsule TAKE ONE CAPSULE AT BEDTIME AS NEEDED  5   No current facility-administered medications for this visit.     Past Medical History:  Diagnosis Date  . Aortic valve sclerosis    Echo, 2008  . Arthritis   . Bradycardia    October, 2012  . Cancer ([place])   . Chest pain    Nuclear, April, 2008, no ischemia,  . Chronic insomnia    situational stress  . Closed fracture of unspecified part of femur 2005  . Diabetes mellitus, type 2 ([place])   . Diverticulitis   . Diverticulosis   . Ejection fraction    EF 60%, echo, February, 2008  //  EF 65-70%, echo, November, 2012  . Hyperlipidemia   . Hypertension   . Long term (current) use of anticoagulants   . Osteoporosis    femur fracture 2005, pelvic fracture 2006  . Persistent atrial fibrillation ([place])   . Personal history of malignant neoplasm of breast   . Syncope   . Unspecified closed fracture of pelvis 2006    Past Surgical History:  Procedure Laterality Date  . CARDIOVERSION N/A 08/09/2012   Procedure: CARDIOVERSION;  Surgeon: Deboraha Sprang, MD;  Location: [place];  Service: Cardiovascular;  Laterality: N/A;  . EYE SURGERY    . FEMUR SURGERY  2005   ORIF  . MASTECTOMY  1995   left  . SHOULDER SURGERY     left  . [place], 2003  . TONSILLECTOMY    . TOTAL ABDOMINAL HYSTERECTOMY W/ BILATERAL SALPINGOOPHORECTOMY  1995  . WRIST SURGERY      x 2     ROS:  As stated in the HPI and  negative for all other systems.  PHYSICAL EXAM BP (!) 142/97   Pulse 68   Ht 5\' 4"  (1.626 m)   Wt 132 lb (59.9 kg)   BMI 22.66 kg/m   GENERAL:  Well appearing NECK:  No jugular venous distention, waveform within normal limits, carotid upstroke brisk and symmetric, no bruits, no thyromegaly LUNGS:  Clear to auscultation bilaterally CHEST:  Unremarkable HEART:  PMI not displaced or sustained,S1 and S2 within normal limits, no S3, no clicks, no rubs, no murmurs, irregular ABD:  Flat, positive bowel sounds normal in frequency in pitch, no bruits, no rebound, no guarding, no midline pulsatile mass, no hepatomegaly, no splenomegaly EXT:  2 plus pulses throughout, no edema, no cyanosis no clubbing   EKG:  NA    ASSESSMENT AND PLAN  ATRIAL FIB:  HerMs. [name] has a CHA2DS2 - VASc score of 5 with a risk of stroke of 5.6%.  Her rate now seems to be well controlled. She will continue the meds as listed. She'll let me know if her heart rate goes low and I will back off of the Cardizem.   HTN:   The blood pressure is at target.  She will continue the meds as listed.

## 2017-01-10 ENCOUNTER — Ambulatory Visit (INDEPENDENT_AMBULATORY_CARE_PROVIDER_SITE_OTHER): Payer: Medicare Other | Admitting: Cardiology

## 2017-01-10 VITALS — BP 142/97 | HR 68 | Ht 64.0 in | Wt 132.0 lb

## 2017-01-10 DIAGNOSIS — I482 Chronic atrial fibrillation, unspecified: Secondary | ICD-10-CM

## 2017-01-10 NOTE — Patient Instructions (Addendum)

## 2017-01-11 ENCOUNTER — Encounter: Payer: Self-pay | Admitting: Cardiology

## 2017-01-25 ENCOUNTER — Other Ambulatory Visit: Payer: Self-pay | Admitting: Cardiology

## 2017-02-03 DIAGNOSIS — E119 Type 2 diabetes mellitus without complications: Secondary | ICD-10-CM | POA: Diagnosis not present

## 2017-02-03 DIAGNOSIS — G4700 Insomnia, unspecified: Secondary | ICD-10-CM | POA: Diagnosis not present

## 2017-02-03 DIAGNOSIS — E782 Mixed hyperlipidemia: Secondary | ICD-10-CM | POA: Diagnosis not present

## 2017-02-03 DIAGNOSIS — N182 Chronic kidney disease, stage 2 (mild): Secondary | ICD-10-CM | POA: Diagnosis not present

## 2017-02-03 DIAGNOSIS — Z0000 Encounter for general adult medical examination without abnormal findings: Secondary | ICD-10-CM | POA: Diagnosis not present

## 2017-02-03 DIAGNOSIS — E1122 Type 2 diabetes mellitus with diabetic chronic kidney disease: Secondary | ICD-10-CM | POA: Diagnosis not present

## 2017-02-03 DIAGNOSIS — Z23 Encounter for immunization: Secondary | ICD-10-CM | POA: Diagnosis not present

## 2017-02-03 DIAGNOSIS — Z79899 Other long term (current) drug therapy: Secondary | ICD-10-CM | POA: Diagnosis not present

## 2017-02-03 DIAGNOSIS — I10 Essential (primary) hypertension: Secondary | ICD-10-CM | POA: Diagnosis not present

## 2017-02-03 DIAGNOSIS — E02 Subclinical iodine-deficiency hypothyroidism: Secondary | ICD-10-CM | POA: Diagnosis not present

## 2017-02-03 DIAGNOSIS — M810 Age-related osteoporosis without current pathological fracture: Secondary | ICD-10-CM | POA: Diagnosis not present

## 2017-02-21 DIAGNOSIS — I712 Thoracic aortic aneurysm, without rupture, unspecified: Secondary | ICD-10-CM

## 2017-02-21 HISTORY — DX: Thoracic aortic aneurysm, without rupture: I71.2

## 2017-02-21 HISTORY — DX: Thoracic aortic aneurysm, without rupture, unspecified: I71.20

## 2017-03-08 DIAGNOSIS — H35372 Puckering of macula, left eye: Secondary | ICD-10-CM | POA: Diagnosis not present

## 2017-03-08 DIAGNOSIS — H43813 Vitreous degeneration, bilateral: Secondary | ICD-10-CM | POA: Diagnosis not present

## 2017-03-08 DIAGNOSIS — H353131 Nonexudative age-related macular degeneration, bilateral, early dry stage: Secondary | ICD-10-CM | POA: Diagnosis not present

## 2017-03-09 ENCOUNTER — Other Ambulatory Visit: Payer: Self-pay | Admitting: Cardiology

## 2017-03-10 ENCOUNTER — Emergency Department (HOSPITAL_COMMUNITY): Payer: No Typology Code available for payment source

## 2017-03-10 ENCOUNTER — Encounter (HOSPITAL_COMMUNITY): Payer: Self-pay | Admitting: Emergency Medicine

## 2017-03-10 ENCOUNTER — Inpatient Hospital Stay (HOSPITAL_COMMUNITY)
Admission: EM | Admit: 2017-03-10 | Discharge: 2017-03-18 | DRG: 981 | Disposition: A | Discharge status: Skilled Nursing Facility | Payer: No Typology Code available for payment source | Attending: General Surgery | Admitting: General Surgery

## 2017-03-10 DIAGNOSIS — J9811 Atelectasis: Secondary | ICD-10-CM | POA: Diagnosis present

## 2017-03-10 DIAGNOSIS — I4819 Other persistent atrial fibrillation: Secondary | ICD-10-CM | POA: Diagnosis present

## 2017-03-10 DIAGNOSIS — S300XXA Contusion of lower back and pelvis, initial encounter: Secondary | ICD-10-CM | POA: Diagnosis present

## 2017-03-10 DIAGNOSIS — S12501A Unspecified nondisplaced fracture of sixth cervical vertebra, initial encounter for closed fracture: Secondary | ICD-10-CM | POA: Diagnosis not present

## 2017-03-10 DIAGNOSIS — S32592A Other specified fracture of left pubis, initial encounter for closed fracture: Secondary | ICD-10-CM | POA: Diagnosis present

## 2017-03-10 DIAGNOSIS — E440 Moderate protein-calorie malnutrition: Secondary | ICD-10-CM | POA: Diagnosis present

## 2017-03-10 DIAGNOSIS — S42121A Displaced fracture of acromial process, right shoulder, initial encounter for closed fracture: Secondary | ICD-10-CM | POA: Diagnosis present

## 2017-03-10 DIAGNOSIS — I361 Nonrheumatic tricuspid (valve) insufficiency: Secondary | ICD-10-CM | POA: Diagnosis not present

## 2017-03-10 DIAGNOSIS — S8990XA Unspecified injury of unspecified lower leg, initial encounter: Secondary | ICD-10-CM | POA: Diagnosis not present

## 2017-03-10 DIAGNOSIS — S32810S Multiple fractures of pelvis with stable disruption of pelvic ring, sequela: Secondary | ICD-10-CM | POA: Diagnosis not present

## 2017-03-10 DIAGNOSIS — Z79899 Other long term (current) drug therapy: Secondary | ICD-10-CM

## 2017-03-10 DIAGNOSIS — I481 Persistent atrial fibrillation: Secondary | ICD-10-CM | POA: Diagnosis present

## 2017-03-10 DIAGNOSIS — S32502A Unspecified fracture of left pubis, initial encounter for closed fracture: Secondary | ICD-10-CM | POA: Diagnosis not present

## 2017-03-10 DIAGNOSIS — S22060A Wedge compression fracture of T7-T8 vertebra, initial encounter for closed fracture: Secondary | ICD-10-CM | POA: Diagnosis not present

## 2017-03-10 DIAGNOSIS — S0003XA Contusion of scalp, initial encounter: Secondary | ICD-10-CM | POA: Diagnosis not present

## 2017-03-10 DIAGNOSIS — S32591A Other specified fracture of right pubis, initial encounter for closed fracture: Secondary | ICD-10-CM | POA: Diagnosis present

## 2017-03-10 DIAGNOSIS — I5032 Chronic diastolic (congestive) heart failure: Secondary | ICD-10-CM | POA: Diagnosis not present

## 2017-03-10 DIAGNOSIS — E871 Hypo-osmolality and hyponatremia: Secondary | ICD-10-CM | POA: Diagnosis present

## 2017-03-10 DIAGNOSIS — S12501D Unspecified nondisplaced fracture of sixth cervical vertebra, subsequent encounter for fracture with routine healing: Secondary | ICD-10-CM | POA: Diagnosis not present

## 2017-03-10 DIAGNOSIS — S22069A Unspecified fracture of T7-T8 vertebra, initial encounter for closed fracture: Secondary | ICD-10-CM

## 2017-03-10 DIAGNOSIS — S8012XA Contusion of left lower leg, initial encounter: Secondary | ICD-10-CM | POA: Diagnosis present

## 2017-03-10 DIAGNOSIS — J90 Pleural effusion, not elsewhere classified: Secondary | ICD-10-CM | POA: Diagnosis present

## 2017-03-10 DIAGNOSIS — D62 Acute posthemorrhagic anemia: Secondary | ICD-10-CM | POA: Diagnosis not present

## 2017-03-10 DIAGNOSIS — R000 Tachycardia, unspecified: Secondary | ICD-10-CM | POA: Diagnosis present

## 2017-03-10 DIAGNOSIS — Y92410 Unspecified street and highway as the place of occurrence of the external cause: Secondary | ICD-10-CM

## 2017-03-10 DIAGNOSIS — M810 Age-related osteoporosis without current pathological fracture: Secondary | ICD-10-CM | POA: Diagnosis present

## 2017-03-10 DIAGNOSIS — F5104 Psychophysiologic insomnia: Secondary | ICD-10-CM | POA: Diagnosis present

## 2017-03-10 DIAGNOSIS — R2681 Unsteadiness on feet: Secondary | ICD-10-CM | POA: Diagnosis not present

## 2017-03-10 DIAGNOSIS — I712 Thoracic aortic aneurysm, without rupture: Secondary | ICD-10-CM | POA: Diagnosis present

## 2017-03-10 DIAGNOSIS — S32401A Unspecified fracture of right acetabulum, initial encounter for closed fracture: Secondary | ICD-10-CM | POA: Diagnosis not present

## 2017-03-10 DIAGNOSIS — S00211A Abrasion of right eyelid and periocular area, initial encounter: Secondary | ICD-10-CM | POA: Diagnosis present

## 2017-03-10 DIAGNOSIS — I482 Chronic atrial fibrillation: Secondary | ICD-10-CM | POA: Diagnosis present

## 2017-03-10 DIAGNOSIS — Z90710 Acquired absence of both cervix and uterus: Secondary | ICD-10-CM

## 2017-03-10 DIAGNOSIS — F329 Major depressive disorder, single episode, unspecified: Secondary | ICD-10-CM | POA: Diagnosis present

## 2017-03-10 DIAGNOSIS — I082 Rheumatic disorders of both aortic and tricuspid valves: Secondary | ICD-10-CM | POA: Diagnosis present

## 2017-03-10 DIAGNOSIS — G8929 Other chronic pain: Secondary | ICD-10-CM | POA: Diagnosis present

## 2017-03-10 DIAGNOSIS — Z90722 Acquired absence of ovaries, bilateral: Secondary | ICD-10-CM

## 2017-03-10 DIAGNOSIS — S22069D Unspecified fracture of T7-T8 vertebra, subsequent encounter for fracture with routine healing: Secondary | ICD-10-CM | POA: Diagnosis not present

## 2017-03-10 DIAGNOSIS — Z794 Long term (current) use of insulin: Secondary | ICD-10-CM

## 2017-03-10 DIAGNOSIS — Z888 Allergy status to other drugs, medicaments and biological substances status: Secondary | ICD-10-CM

## 2017-03-10 DIAGNOSIS — Y92230 Patient room in hospital as the place of occurrence of the external cause: Secondary | ICD-10-CM | POA: Diagnosis not present

## 2017-03-10 DIAGNOSIS — T462X5A Adverse effect of other antidysrhythmic drugs, initial encounter: Secondary | ICD-10-CM | POA: Diagnosis not present

## 2017-03-10 DIAGNOSIS — E119 Type 2 diabetes mellitus without complications: Secondary | ICD-10-CM | POA: Diagnosis present

## 2017-03-10 DIAGNOSIS — I110 Hypertensive heart disease with heart failure: Secondary | ICD-10-CM | POA: Diagnosis present

## 2017-03-10 DIAGNOSIS — Z4789 Encounter for other orthopedic aftercare: Secondary | ICD-10-CM | POA: Diagnosis not present

## 2017-03-10 DIAGNOSIS — Z8249 Family history of ischemic heart disease and other diseases of the circulatory system: Secondary | ICD-10-CM

## 2017-03-10 DIAGNOSIS — I700 Atherosclerosis of aorta: Secondary | ICD-10-CM | POA: Diagnosis not present

## 2017-03-10 DIAGNOSIS — R578 Other shock: Secondary | ICD-10-CM | POA: Diagnosis present

## 2017-03-10 DIAGNOSIS — S42101A Fracture of unspecified part of scapula, right shoulder, initial encounter for closed fracture: Secondary | ICD-10-CM | POA: Diagnosis not present

## 2017-03-10 DIAGNOSIS — T148XXA Other injury of unspecified body region, initial encounter: Secondary | ICD-10-CM

## 2017-03-10 DIAGNOSIS — I716 Thoracoabdominal aortic aneurysm, without rupture: Secondary | ICD-10-CM | POA: Diagnosis not present

## 2017-03-10 DIAGNOSIS — R2689 Other abnormalities of gait and mobility: Secondary | ICD-10-CM | POA: Diagnosis not present

## 2017-03-10 DIAGNOSIS — M549 Dorsalgia, unspecified: Secondary | ICD-10-CM | POA: Diagnosis present

## 2017-03-10 DIAGNOSIS — Z853 Personal history of malignant neoplasm of breast: Secondary | ICD-10-CM

## 2017-03-10 DIAGNOSIS — Z6823 Body mass index (BMI) 23.0-23.9, adult: Secondary | ICD-10-CM

## 2017-03-10 DIAGNOSIS — R41841 Cognitive communication deficit: Secondary | ICD-10-CM | POA: Diagnosis not present

## 2017-03-10 DIAGNOSIS — Z885 Allergy status to narcotic agent status: Secondary | ICD-10-CM

## 2017-03-10 DIAGNOSIS — T40605A Adverse effect of unspecified narcotics, initial encounter: Secondary | ICD-10-CM | POA: Diagnosis not present

## 2017-03-10 DIAGNOSIS — I7102 Dissection of abdominal aorta: Secondary | ICD-10-CM | POA: Diagnosis not present

## 2017-03-10 DIAGNOSIS — S3991XA Unspecified injury of abdomen, initial encounter: Secondary | ICD-10-CM | POA: Diagnosis not present

## 2017-03-10 DIAGNOSIS — I5030 Unspecified diastolic (congestive) heart failure: Secondary | ICD-10-CM | POA: Diagnosis present

## 2017-03-10 DIAGNOSIS — Z7901 Long term (current) use of anticoagulants: Secondary | ICD-10-CM

## 2017-03-10 DIAGNOSIS — S329XXA Fracture of unspecified parts of lumbosacral spine and pelvis, initial encounter for closed fracture: Secondary | ICD-10-CM

## 2017-03-10 DIAGNOSIS — S32810D Multiple fractures of pelvis with stable disruption of pelvic ring, subsequent encounter for fracture with routine healing: Secondary | ICD-10-CM | POA: Diagnosis not present

## 2017-03-10 DIAGNOSIS — E785 Hyperlipidemia, unspecified: Secondary | ICD-10-CM | POA: Diagnosis present

## 2017-03-10 DIAGNOSIS — S299XXA Unspecified injury of thorax, initial encounter: Secondary | ICD-10-CM | POA: Diagnosis not present

## 2017-03-10 DIAGNOSIS — S2500XA Unspecified injury of thoracic aorta, initial encounter: Secondary | ICD-10-CM | POA: Diagnosis not present

## 2017-03-10 DIAGNOSIS — G8911 Acute pain due to trauma: Secondary | ICD-10-CM | POA: Diagnosis not present

## 2017-03-10 DIAGNOSIS — S22068A Other fracture of T7-T8 thoracic vertebra, initial encounter for closed fracture: Secondary | ICD-10-CM | POA: Diagnosis present

## 2017-03-10 DIAGNOSIS — M25572 Pain in left ankle and joints of left foot: Secondary | ICD-10-CM | POA: Diagnosis present

## 2017-03-10 DIAGNOSIS — S4291XA Fracture of right shoulder girdle, part unspecified, initial encounter for closed fracture: Secondary | ICD-10-CM | POA: Diagnosis not present

## 2017-03-10 DIAGNOSIS — S12590A Other displaced fracture of sixth cervical vertebra, initial encounter for closed fracture: Secondary | ICD-10-CM | POA: Diagnosis not present

## 2017-03-10 DIAGNOSIS — S32810A Multiple fractures of pelvis with stable disruption of pelvic ring, initial encounter for closed fracture: Secondary | ICD-10-CM

## 2017-03-10 DIAGNOSIS — I7101 Dissection of thoracic aorta: Secondary | ICD-10-CM | POA: Diagnosis not present

## 2017-03-10 DIAGNOSIS — Z833 Family history of diabetes mellitus: Secondary | ICD-10-CM

## 2017-03-10 DIAGNOSIS — R011 Cardiac murmur, unspecified: Secondary | ICD-10-CM | POA: Diagnosis present

## 2017-03-10 DIAGNOSIS — Z807 Family history of other malignant neoplasms of lymphoid, hematopoietic and related tissues: Secondary | ICD-10-CM

## 2017-03-10 DIAGNOSIS — Z9889 Other specified postprocedural states: Secondary | ICD-10-CM | POA: Diagnosis not present

## 2017-03-10 DIAGNOSIS — M6281 Muscle weakness (generalized): Secondary | ICD-10-CM | POA: Diagnosis not present

## 2017-03-10 DIAGNOSIS — Z9010 Acquired absence of unspecified breast and nipple: Secondary | ICD-10-CM | POA: Diagnosis not present

## 2017-03-10 DIAGNOSIS — M419 Scoliosis, unspecified: Secondary | ICD-10-CM | POA: Diagnosis present

## 2017-03-10 DIAGNOSIS — S32501A Unspecified fracture of right pubis, initial encounter for closed fracture: Secondary | ICD-10-CM | POA: Diagnosis not present

## 2017-03-10 DIAGNOSIS — I2584 Coronary atherosclerosis due to calcified coronary lesion: Secondary | ICD-10-CM | POA: Diagnosis present

## 2017-03-10 DIAGNOSIS — S3993XA Unspecified injury of pelvis, initial encounter: Secondary | ICD-10-CM | POA: Diagnosis not present

## 2017-03-10 DIAGNOSIS — I272 Pulmonary hypertension, unspecified: Secondary | ICD-10-CM | POA: Diagnosis present

## 2017-03-10 DIAGNOSIS — S12500A Unspecified displaced fracture of sixth cervical vertebra, initial encounter for closed fracture: Secondary | ICD-10-CM | POA: Diagnosis present

## 2017-03-10 DIAGNOSIS — M25519 Pain in unspecified shoulder: Secondary | ICD-10-CM | POA: Diagnosis not present

## 2017-03-10 DIAGNOSIS — S12501S Unspecified nondisplaced fracture of sixth cervical vertebra, sequela: Secondary | ICD-10-CM | POA: Diagnosis not present

## 2017-03-10 DIAGNOSIS — I7100 Dissection of unspecified site of aorta: Secondary | ICD-10-CM

## 2017-03-10 DIAGNOSIS — S42121S Displaced fracture of acromial process, right shoulder, sequela: Secondary | ICD-10-CM | POA: Diagnosis not present

## 2017-03-10 DIAGNOSIS — S42121D Displaced fracture of acromial process, right shoulder, subsequent encounter for fracture with routine healing: Secondary | ICD-10-CM | POA: Diagnosis not present

## 2017-03-10 DIAGNOSIS — R110 Nausea: Secondary | ICD-10-CM | POA: Diagnosis not present

## 2017-03-10 DIAGNOSIS — I2510 Atherosclerotic heart disease of native coronary artery without angina pectoris: Secondary | ICD-10-CM | POA: Diagnosis present

## 2017-03-10 DIAGNOSIS — S330XXA Traumatic rupture of lumbar intervertebral disc, initial encounter: Secondary | ICD-10-CM | POA: Diagnosis not present

## 2017-03-10 DIAGNOSIS — S22069S Unspecified fracture of T7-T8 vertebra, sequela: Secondary | ICD-10-CM | POA: Diagnosis not present

## 2017-03-10 DIAGNOSIS — R6889 Other general symptoms and signs: Secondary | ICD-10-CM | POA: Diagnosis not present

## 2017-03-10 DIAGNOSIS — I639 Cerebral infarction, unspecified: Secondary | ICD-10-CM | POA: Diagnosis not present

## 2017-03-10 LAB — URINALYSIS, ROUTINE W REFLEX MICROSCOPIC
Bilirubin Urine: NEGATIVE
Glucose, UA: NEGATIVE mg/dL
Hgb urine dipstick: NEGATIVE
Ketones, ur: 5 mg/dL — AB
Leukocytes, UA: NEGATIVE
Nitrite: NEGATIVE
Protein, ur: NEGATIVE mg/dL
Specific Gravity, Urine: 1.015 (ref 1.005–1.030)
pH: 7 (ref 5.0–8.0)

## 2017-03-10 LAB — I-STAT CHEM 8, ED
BUN: 23 mg/dL — ABNORMAL HIGH (ref 6–20)
Calcium, Ion: 1.15 mmol/L (ref 1.15–1.40)
Chloride: 98 mmol/L — ABNORMAL LOW (ref 101–111)
Creatinine, Ser: 0.8 mg/dL (ref 0.44–1.00)
Glucose, Bld: 129 mg/dL — ABNORMAL HIGH (ref 65–99)
HCT: 39 % (ref 36.0–46.0)
Hemoglobin: 13.3 g/dL (ref 12.0–15.0)
Potassium: 3.8 mmol/L (ref 3.5–5.1)
Sodium: 139 mmol/L (ref 135–145)
TCO2: 28 mmol/L (ref 22–32)

## 2017-03-10 LAB — CBC
HCT: 40.8 % (ref 36.0–46.0)
Hemoglobin: 13.1 g/dL (ref 12.0–15.0)
MCH: 29.2 pg (ref 26.0–34.0)
MCHC: 32.1 g/dL (ref 30.0–36.0)
MCV: 91.1 fL (ref 78.0–100.0)
Platelets: 228 10*3/uL (ref 150–400)
RBC: 4.48 MIL/uL (ref 3.87–5.11)
RDW: 14.5 % (ref 11.5–15.5)
WBC: 17 10*3/uL — ABNORMAL HIGH (ref 4.0–10.5)

## 2017-03-10 LAB — PROTIME-INR
INR: 1.16
Prothrombin Time: 14.8 seconds (ref 11.4–15.2)

## 2017-03-10 LAB — COMPREHENSIVE METABOLIC PANEL
ALT: 15 U/L (ref 14–54)
AST: 28 U/L (ref 15–41)
Albumin: 3.7 g/dL (ref 3.5–5.0)
Alkaline Phosphatase: 100 U/L (ref 38–126)
Anion gap: 10 (ref 5–15)
BUN: 17 mg/dL (ref 6–20)
CO2: 28 mmol/L (ref 22–32)
Calcium: 9.3 mg/dL (ref 8.9–10.3)
Chloride: 101 mmol/L (ref 101–111)
Creatinine, Ser: 0.8 mg/dL (ref 0.44–1.00)
GFR calc Af Amer: >60 mL/min (ref >60)
GFR calc non Af Amer: >60 mL/min (ref >60)
Glucose, Bld: 133 mg/dL — ABNORMAL HIGH (ref 65–99)
Potassium: 3.8 mmol/L (ref 3.5–5.1)
Sodium: 139 mmol/L (ref 135–145)
Total Bilirubin: 1.1 mg/dL (ref 0.3–1.2)
Total Protein: 5.8 g/dL — ABNORMAL LOW (ref 6.5–8.1)

## 2017-03-10 LAB — CDS SEROLOGY

## 2017-03-10 LAB — ABO/RH: ABO/RH(D): O POS

## 2017-03-10 LAB — ETHANOL: Alcohol, Ethyl (B): <10 mg/dL (ref <10)

## 2017-03-10 LAB — SAMPLE TO BLOOD BANK

## 2017-03-10 LAB — I-STAT CG4 LACTIC ACID, ED: Lactic Acid, Venous: 1.5 mmol/L (ref 0.5–1.9)

## 2017-03-10 MED ORDER — GELATIN ABSORBABLE 12-7 MM EX MISC
CUTANEOUS | Status: AC
  Administered 2017-03-11
  Filled 2017-03-10: qty 1

## 2017-03-10 MED ORDER — MIDAZOLAM HCL 2 MG/2ML IJ SOLN
INTRAMUSCULAR | Status: AC | PRN
  Administered 2017-03-10: 0.5 mg via INTRAVENOUS
  Administered 2017-03-10: 1 mg via INTRAVENOUS
  Administered 2017-03-10: 0.5 mg via INTRAVENOUS

## 2017-03-10 MED ORDER — MIDAZOLAM HCL 2 MG/2ML IJ SOLN
INTRAMUSCULAR | Status: AC
  Filled 2017-03-10: qty 2

## 2017-03-10 MED ORDER — FENTANYL CITRATE (PF) 100 MCG/2ML IJ SOLN
INTRAMUSCULAR | Status: AC
  Filled 2017-03-10: qty 2

## 2017-03-10 MED ORDER — SODIUM CHLORIDE 0.9 % IV BOLUS (SEPSIS)
1000.0000 mL | Freq: Once | INTRAVENOUS | Status: AC
  Administered 2017-03-10: 1000 mL via INTRAVENOUS

## 2017-03-10 MED ORDER — IOPAMIDOL (ISOVUE-300) INJECTION 61%
INTRAVENOUS | Status: AC
  Administered 2017-03-10: 100 mL
  Filled 2017-03-10: qty 100

## 2017-03-10 MED ORDER — FENTANYL CITRATE (PF) 100 MCG/2ML IJ SOLN
INTRAMUSCULAR | Status: AC
  Administered 2017-03-10: 12.5 ug
  Filled 2017-03-10: qty 2

## 2017-03-10 MED ORDER — SODIUM CHLORIDE 0.9 % IV SOLN
10.0000 mL/h | Freq: Once | INTRAVENOUS | Status: AC
  Administered 2017-03-10: 10 mL/h via INTRAVENOUS

## 2017-03-10 MED ORDER — LIDOCAINE HCL 1 % IJ SOLN
INTRAMUSCULAR | Status: AC
  Filled 2017-03-10: qty 20

## 2017-03-10 MED ORDER — IOPAMIDOL (ISOVUE-300) INJECTION 61%
INTRAVENOUS | Status: AC
  Administered 2017-03-11: 30 mL
  Filled 2017-03-10: qty 150

## 2017-03-10 MED ORDER — FENTANYL CITRATE (PF) 100 MCG/2ML IJ SOLN
INTRAMUSCULAR | Status: AC | PRN
  Administered 2017-03-10 (×4): 25 ug via INTRAVENOUS

## 2017-03-10 MED ORDER — LIDOCAINE HCL 1 % IJ SOLN
INTRAMUSCULAR | Status: AC | PRN
  Administered 2017-03-10: 5 mL

## 2017-03-10 MED ORDER — PROTHROMBIN COMPLEX CONC HUMAN 500 UNITS IV KIT
3451.0000 [IU] | PACK | Status: AC
  Administered 2017-03-10: 3451 [IU] via INTRAVENOUS
  Filled 2017-03-10: qty 3451

## 2017-03-10 MED ORDER — FENTANYL CITRATE (PF) 100 MCG/2ML IJ SOLN
INTRAMUSCULAR | Status: AC | PRN
  Administered 2017-03-10: 25 ug via INTRAVENOUS

## 2017-03-10 MED ORDER — IOPAMIDOL (ISOVUE-300) INJECTION 61%
INTRAVENOUS | Status: AC
  Administered 2017-03-11: 30 mL
  Filled 2017-03-10: qty 100

## 2017-03-10 NOTE — Telephone Encounter (Signed)
REFILL 
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
## 2017-03-10 NOTE — ED Notes (Signed)
1st unit platelet infusing at 999 ml/hr , no adverse effect , IV sites unremarkable , Kcentra IV infusing .

## 2017-03-10 NOTE — Sedation Documentation (Signed)
Patient denies pain and is resting comfortably.  

## 2017-03-10 NOTE — ED Notes (Signed)
Patient transported to IR , bedside report given at IR .

## 2017-03-10 NOTE — ED Notes (Signed)
Pt.'s son signed consent form for pt.'s blood transfusion order .

## 2017-03-10 NOTE — ED Notes (Signed)
Patient transported to CT scan . 

## 2017-03-10 NOTE — ED Notes (Addendum)
1ST UNIT PRBC infusing emergency blood transfusing using pressure bag .
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
## 2017-03-10 NOTE — ED Notes (Signed)
Dr. Redmond Pulling explained tests results , plan of care and admission to pt. and family . Kcentra IV and 1st unit platelet infusing .

## 2017-03-10 NOTE — Progress Notes (Signed)
   03/10/17 2119  Clinical Encounter Type  Visited With Patient and family together  Visit Type Trauma  Referral From Nurse  Consult/Referral To Chaplain  Spiritual Encounters  Spiritual Needs Emotional  Stress Factors  Family Stress Factors Health changes   Responded to a page to the ED. Patient was already here and upgraded to [place] with son and a member of their church's staff.  Provided hospitality and a listening ear.  Will follow as needed.   Chaplain Katherene Ponto

## 2017-03-10 NOTE — ED Notes (Signed)
1st unit PRBC completed. 2nd unit PRBC infusing using [place] infuser.
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
## 2017-03-10 NOTE — ED Notes (Signed)
2nd unit PRBC completed using [place] rapid infuser.
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
## 2017-03-10 NOTE — ED Notes (Signed)
Patient transported to CT 

## 2017-03-10 NOTE — ED Notes (Signed)
1st unit platelet completed with no adverse reaction .

## 2017-03-10 NOTE — ED Notes (Signed)
1st unit Platelet infusing at 120 ml/hr for 15 mins .
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
## 2017-03-11 ENCOUNTER — Encounter (HOSPITAL_COMMUNITY): Payer: Self-pay | Admitting: Interventional Radiology

## 2017-03-11 ENCOUNTER — Inpatient Hospital Stay (HOSPITAL_COMMUNITY): Payer: No Typology Code available for payment source

## 2017-03-11 DIAGNOSIS — I481 Persistent atrial fibrillation: Secondary | ICD-10-CM

## 2017-03-11 HISTORY — PX: IR EMBO ART  VEN HEMORR LYMPH EXTRAV  INC GUIDE ROADMAPPING: IMG5450

## 2017-03-11 HISTORY — PX: IR ANGIOGRAM PELVIS SELECTIVE OR SUPRASELECTIVE: IMG661

## 2017-03-11 HISTORY — PX: IR ANGIOGRAM SELECTIVE EACH ADDITIONAL VESSEL: IMG667

## 2017-03-11 HISTORY — PX: IR US GUIDE VASC ACCESS RIGHT: IMG2390

## 2017-03-11 HISTORY — PX: IR FLUORO GUIDE CV LINE RIGHT: IMG2283

## 2017-03-11 LAB — PREPARE PLATELET PHERESIS: Unit division: 0

## 2017-03-11 LAB — BPAM FFP
Blood Product Expiration Date: 201810182359
Blood Product Expiration Date: 201810182359
ISSUE DATE / TIME: 201810182047
ISSUE DATE / TIME: 201810182047
Unit Type and Rh: 6200
Unit Type and Rh: 6200

## 2017-03-11 LAB — CBC WITH DIFFERENTIAL/PLATELET
Basophils Absolute: 0 10*3/uL (ref 0.0–0.1)
Basophils Relative: 0 %
Eosinophils Absolute: 0 10*3/uL (ref 0.0–0.7)
Eosinophils Relative: 0 %
HCT: 32 % — ABNORMAL LOW (ref 36.0–46.0)
Hemoglobin: 10.4 g/dL — ABNORMAL LOW (ref 12.0–15.0)
Lymphocytes Relative: 5 %
Lymphs Abs: 0.6 10*3/uL — ABNORMAL LOW (ref 0.7–4.0)
MCH: 29.3 pg (ref 26.0–34.0)
MCHC: 32.5 g/dL (ref 30.0–36.0)
MCV: 90.1 fL (ref 78.0–100.0)
Monocytes Absolute: 0.9 10*3/uL (ref 0.1–1.0)
Monocytes Relative: 9 %
Neutro Abs: 9.5 10*3/uL — ABNORMAL HIGH (ref 1.7–7.7)
Neutrophils Relative %: 86 %
Platelets: 180 10*3/uL (ref 150–400)
RBC: 3.55 MIL/uL — ABNORMAL LOW (ref 3.87–5.11)
RDW: 14.8 % (ref 11.5–15.5)
WBC: 11 10*3/uL — ABNORMAL HIGH (ref 4.0–10.5)

## 2017-03-11 LAB — BPAM RBC
Blood Product Expiration Date: 201811162359
Blood Product Expiration Date: 201811162359
ISSUE DATE / TIME: 201810182045
ISSUE DATE / TIME: 201810182045
Unit Type and Rh: 5100
Unit Type and Rh: 5100

## 2017-03-11 LAB — PROTIME-INR
INR: 1.09
INR: 1.11
Prothrombin Time: 14 seconds (ref 11.4–15.2)
Prothrombin Time: 14.2 seconds (ref 11.4–15.2)

## 2017-03-11 LAB — GLUCOSE, CAPILLARY
Glucose-Capillary: 100 mg/dL — ABNORMAL HIGH (ref 65–99)
Glucose-Capillary: 111 mg/dL — ABNORMAL HIGH (ref 65–99)
Glucose-Capillary: 114 mg/dL — ABNORMAL HIGH (ref 65–99)
Glucose-Capillary: 156 mg/dL — ABNORMAL HIGH (ref 65–99)
Glucose-Capillary: 173 mg/dL — ABNORMAL HIGH (ref 65–99)
Glucose-Capillary: 188 mg/dL — ABNORMAL HIGH (ref 65–99)
Glucose-Capillary: 217 mg/dL — ABNORMAL HIGH (ref 65–99)

## 2017-03-11 LAB — BPAM PLATELET PHERESIS
Blood Product Expiration Date: 201810202359
ISSUE DATE / TIME: 201810182159
Unit Type and Rh: 5100

## 2017-03-11 LAB — COMPREHENSIVE METABOLIC PANEL
ALT: 13 U/L — ABNORMAL LOW (ref 14–54)
AST: 22 U/L (ref 15–41)
Albumin: 2.9 g/dL — ABNORMAL LOW (ref 3.5–5.0)
Alkaline Phosphatase: 69 U/L (ref 38–126)
Anion gap: 9 (ref 5–15)
BUN: 19 mg/dL (ref 6–20)
CO2: 21 mmol/L — ABNORMAL LOW (ref 22–32)
Calcium: 7.4 mg/dL — ABNORMAL LOW (ref 8.9–10.3)
Chloride: 106 mmol/L (ref 101–111)
Creatinine, Ser: 0.82 mg/dL (ref 0.44–1.00)
GFR calc Af Amer: >60 mL/min (ref >60)
GFR calc non Af Amer: >60 mL/min (ref >60)
Glucose, Bld: 204 mg/dL — ABNORMAL HIGH (ref 65–99)
Potassium: 3.5 mmol/L (ref 3.5–5.1)
Sodium: 136 mmol/L (ref 135–145)
Total Bilirubin: 1.4 mg/dL — ABNORMAL HIGH (ref 0.3–1.2)
Total Protein: 5 g/dL — ABNORMAL LOW (ref 6.5–8.1)

## 2017-03-11 LAB — PREPARE FRESH FROZEN PLASMA
Unit division: 0
Unit division: 0

## 2017-03-11 LAB — TYPE AND SCREEN
ABO/RH(D): O POS
Antibody Screen: NEGATIVE
Unit division: 0
Unit division: 0

## 2017-03-11 LAB — MRSA PCR SCREENING: MRSA by PCR: NEGATIVE

## 2017-03-11 LAB — CBC
HCT: 34.3 % — ABNORMAL LOW (ref 36.0–46.0)
Hemoglobin: 11.1 g/dL — ABNORMAL LOW (ref 12.0–15.0)
MCH: 29.4 pg (ref 26.0–34.0)
MCHC: 32.4 g/dL (ref 30.0–36.0)
MCV: 91 fL (ref 78.0–100.0)
Platelets: 188 10*3/uL (ref 150–400)
RBC: 3.77 MIL/uL — ABNORMAL LOW (ref 3.87–5.11)
RDW: 14.5 % (ref 11.5–15.5)
WBC: 13.7 10*3/uL — ABNORMAL HIGH (ref 4.0–10.5)

## 2017-03-11 MED ORDER — SODIUM CHLORIDE 0.9 % IV BOLUS (SEPSIS)
500.0000 mL | Freq: Once | INTRAVENOUS | Status: AC
  Administered 2017-03-11: 500 mL via INTRAVENOUS

## 2017-03-11 MED ORDER — DOCUSATE SODIUM 100 MG PO CAPS
100.0000 mg | ORAL_CAPSULE | Freq: Two times a day (BID) | ORAL | Status: DC
  Administered 2017-03-11 – 2017-03-18 (×15): 100 mg via ORAL
  Filled 2017-03-11 (×15): qty 1

## 2017-03-11 MED ORDER — SODIUM CHLORIDE 0.9% FLUSH
10.0000 mL | Freq: Two times a day (BID) | INTRAVENOUS | Status: DC
  Administered 2017-03-11 – 2017-03-12 (×3): 10 mL

## 2017-03-11 MED ORDER — PANTOPRAZOLE SODIUM 40 MG IV SOLR: 40.0000 mg | Freq: Every day | INTRAVENOUS | Status: DC

## 2017-03-11 MED ORDER — MORPHINE SULFATE (PF) 4 MG/ML IV SOLN
1.0000 mg | INTRAVENOUS | Status: DC | PRN
  Administered 2017-03-11 – 2017-03-13 (×6): 2 mg via INTRAVENOUS
  Filled 2017-03-11 (×5): qty 1

## 2017-03-11 MED ORDER — ONDANSETRON HCL 4 MG/2ML IJ SOLN
4.0000 mg | Freq: Four times a day (QID) | INTRAMUSCULAR | Status: DC | PRN
  Administered 2017-03-12 – 2017-03-14 (×5): 4 mg via INTRAVENOUS
  Filled 2017-03-11 (×5): qty 2

## 2017-03-11 MED ORDER — TRAMADOL HCL 50 MG PO TABS
50.0000 mg | ORAL_TABLET | Freq: Four times a day (QID) | ORAL | Status: DC | PRN
  Administered 2017-03-11 – 2017-03-18 (×10): 50 mg via ORAL
  Filled 2017-03-11 (×12): qty 1

## 2017-03-11 MED ORDER — DIGOXIN 125 MCG PO TABS
0.1250 mg | ORAL_TABLET | Freq: Every day | ORAL | Status: DC
  Administered 2017-03-11 – 2017-03-16 (×6): 0.125 mg via ORAL
  Filled 2017-03-11 (×7): qty 1

## 2017-03-11 MED ORDER — INSULIN ASPART 100 UNIT/ML ~~LOC~~ SOLN
0.0000 [IU] | SUBCUTANEOUS | Status: DC
  Administered 2017-03-11: 3 [IU] via SUBCUTANEOUS
  Administered 2017-03-11: 5 [IU] via SUBCUTANEOUS
  Administered 2017-03-11 (×2): 3 [IU] via SUBCUTANEOUS
  Administered 2017-03-12: 5 [IU] via SUBCUTANEOUS
  Administered 2017-03-12: 3 [IU] via SUBCUTANEOUS
  Administered 2017-03-12 – 2017-03-13 (×3): 2 [IU] via SUBCUTANEOUS
  Administered 2017-03-13: 3 [IU] via SUBCUTANEOUS
  Administered 2017-03-13 – 2017-03-14 (×3): 5 [IU] via SUBCUTANEOUS
  Administered 2017-03-14: 2 [IU] via SUBCUTANEOUS
  Administered 2017-03-14: 3 [IU] via SUBCUTANEOUS
  Administered 2017-03-14 – 2017-03-15 (×2): 2 [IU] via SUBCUTANEOUS
  Administered 2017-03-15 (×2): 5 [IU] via SUBCUTANEOUS
  Administered 2017-03-15: 2 [IU] via SUBCUTANEOUS
  Administered 2017-03-15: 5 [IU] via SUBCUTANEOUS
  Administered 2017-03-15: 3 [IU] via SUBCUTANEOUS
  Administered 2017-03-16: 2 [IU] via SUBCUTANEOUS
  Administered 2017-03-16: 5 [IU] via SUBCUTANEOUS

## 2017-03-11 MED ORDER — METOPROLOL TARTRATE 5 MG/5ML IV SOLN
2.5000 mg | Freq: Four times a day (QID) | INTRAVENOUS | Status: DC | PRN
  Administered 2017-03-11 – 2017-03-14 (×4): 2.5 mg via INTRAVENOUS
  Filled 2017-03-11 (×4): qty 5

## 2017-03-11 MED ORDER — TEMAZEPAM 15 MG PO CAPS
15.0000 mg | ORAL_CAPSULE | Freq: Every evening | ORAL | Status: DC | PRN
  Administered 2017-03-11 – 2017-03-16 (×6): 15 mg via ORAL
  Filled 2017-03-11 (×7): qty 1

## 2017-03-11 MED ORDER — PANTOPRAZOLE SODIUM 40 MG PO TBEC
40.0000 mg | DELAYED_RELEASE_TABLET | Freq: Every day | ORAL | Status: DC
  Administered 2017-03-11 – 2017-03-18 (×8): 40 mg via ORAL
  Filled 2017-03-11 (×8): qty 1

## 2017-03-11 MED ORDER — MORPHINE SULFATE (PF) 4 MG/ML IV SOLN
INTRAVENOUS | Status: AC
  Filled 2017-03-11: qty 1

## 2017-03-11 MED ORDER — METOPROLOL TARTRATE 5 MG/5ML IV SOLN: 2.5000 mg | Freq: Four times a day (QID) | INTRAVENOUS | Status: DC

## 2017-03-11 MED ORDER — CHLORHEXIDINE GLUCONATE CLOTH 2 % EX PADS
6.0000 | MEDICATED_PAD | Freq: Every day | CUTANEOUS | Status: DC
  Administered 2017-03-11 – 2017-03-13 (×2): 6 via TOPICAL

## 2017-03-11 MED ORDER — ACETAMINOPHEN 325 MG PO TABS
650.0000 mg | ORAL_TABLET | ORAL | Status: DC | PRN
  Administered 2017-03-11: 650 mg via ORAL
  Filled 2017-03-11: qty 2

## 2017-03-11 MED ORDER — SODIUM CHLORIDE 0.9 % IV BOLUS (SEPSIS)
1000.0000 mL | Freq: Once | INTRAVENOUS | Status: AC
  Administered 2017-03-11: 1000 mL via INTRAVENOUS

## 2017-03-11 MED ORDER — ONDANSETRON 4 MG PO TBDP: 4.0000 mg | ORAL_TABLET | Freq: Four times a day (QID) | ORAL | Status: DC | PRN

## 2017-03-11 MED ORDER — METOPROLOL TARTRATE 25 MG PO TABS
25.0000 mg | ORAL_TABLET | Freq: Two times a day (BID) | ORAL | Status: DC
  Administered 2017-03-11 – 2017-03-18 (×14): 25 mg via ORAL
  Filled 2017-03-11 (×14): qty 1

## 2017-03-11 MED ORDER — POTASSIUM CHLORIDE IN NACL 20-0.9 MEQ/L-% IV SOLN
INTRAVENOUS | Status: DC
  Administered 2017-03-11 – 2017-03-15 (×3): via INTRAVENOUS
  Filled 2017-03-11 (×3): qty 1000

## 2017-03-11 MED ORDER — SODIUM CHLORIDE 0.9% FLUSH: 10.0000 mL | INTRAVENOUS | Status: DC | PRN
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
## 2017-03-11 NOTE — Progress Notes (Addendum)
   Daily Progress Note   Assessment/Planning:   Multisystem trauma, likely chronic descending thoracic aortic PAU   CTA Chest tomorrow AM to reinterrogate PAU.  I expect no changes.  Pt is completely asx in her chest/thoracic back.  Hemodynamics have been stable.  While fully anticoagulated on Xarelto, do not recommend TEVAR as the large caliber access necessary to deliver thoracic endograft could lead to massive bleeding, especially if iliac arterial injury were to occur.  Given the extensive calcific atherosclerosis in this patient, traumatic shearing of the iliac arteries during endograft deliver is a distinct possibility  Dr. Trula Slade will be covering for my group over the weekend.   Subjective  - * No surgery found *   No chest or upper back pain, not moving much   Objective   Vitals:   03/11/17 0600 03/11/17 0630 03/11/17 0700 03/11/17 0800  BP: 100/71 98/64 104/72   Pulse: (!) 140 (!) 127 (!) 134   Resp: 17 (!) 21 (!) 27   Temp:    97.6 F (36.4 C)  TempSrc:    Oral  SpO2: 96% 96% 97%   Weight:      Height:         Intake/Output Summary (Last 24 hours) at 03/11/17 0833 Last data filed at 03/11/17 0700  Gross per 24 hour  Intake          3489.17 ml  Output             1120 ml  Net          2369.17 ml   CV irr, irr, tachycardiac PULM shallow breathes, B rales Vasc easily palpable pulses in extremities   Laboratory   CBC CBC Latest Ref Rng & Units 03/11/2017 03/10/2017 03/10/2017  WBC 4.0 - 10.5 K/uL 13.7(H) - 17.0(H)  Hemoglobin 12.0 - 15.0 g/dL 11.1(L) 13.3 13.1  Hematocrit 36.0 - 46.0 % 34.3(L) 39.0 40.8  Platelets 150 - 400 K/uL 188 - 228    BMET    Component Value Date/Time   NA 136 03/11/2017 0254   K 3.5 03/11/2017 0254   CL 106 03/11/2017 0254   CO2 21 (L) 03/11/2017 0254   GLUCOSE 204 (H) 03/11/2017 0254   BUN 19 03/11/2017 0254   CREATININE 0.82 03/11/2017 0254   CREATININE 0.71 12/31/2015 1504   CALCIUM 7.4 (L) 03/11/2017 0254   GFRNONAA >60 03/11/2017 0254   GFRAA >60 03/11/2017 0254     Adele Barthel, MD, FACS [facility] Office: [phone] Pager: [phone]  03/11/2017, 8:33 AM
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
## 2017-03-11 NOTE — Progress Notes (Signed)
Pt was verified by name and d.o.b. 5Fr sheath was pulled on 03/11/17 at 1515hrs. V-pad was applied and hemostasis was achieved at 1530hrs. RN Lattie Haw verified hemostasis was achieved at bedside.

## 2017-03-11 NOTE — Progress Notes (Signed)
Orthopedic Tech Progress Note Patient Details:  Tara Bradley [birth year] [id number]  Patient ID: Tara Bradley, female   DOB: 1923/02/16, 83 y.o.   MRN: [id number]   Hildred Priest 03/11/2017, 9:18 AM Called in bio-tech brace order; spoke with Bella Kennedy

## 2017-03-11 NOTE — Progress Notes (Signed)
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 common iliac vein. The catheter was flushed and secured to the skin with 0 Prolene suture. The arterial sheath was also secured to the skin with 0 Prolene suture. A sterile bandage was applied. IMPRESSION: 1. Pelvic arteriography demonstrates no active of active bleeding at this time. However, there is hyperemia  along the left superior pubic ramus fracture in the distribution of the obturator artery. 2. Prophylactic temporary (Gel-Foam) embolization of the left obturator artery to reduce the risk of recurrent hemorrhage. 3. Placement of a right common femoral vein 20 cm power injectable triple-lumen PICC. 4. The right common femoral arterial sheath with was left in place. This can be removed after the patient is been off Xarelto for 24 hours. Signed, Criselda Peaches, MD [facility] Radiology Electronically Signed   By: Jacqulynn Cadet M.D.   On: 03/11/2017 00:27    Anti-infectives: Anti-infectives    None      Assessment/Plan: s/p  Advance diet Continue foley due to patient critically ill and patient in ICU  Will try to get arterial sheath out later today.  Patient has received KCentra, but Andexxa more specific if concerned about continued bleeding.  Will recheck cbc  LOS: 1 day   Kathryne Eriksson. Dahlia Bailiff, MD, FACS [phone] Trauma Surgeon 03/11/2017

## 2017-03-11 NOTE — Progress Notes (Signed)
Orthopedic Tech Progress Note Patient Details:  Tara Bradley [birth year] [id number]  Ortho Devices Type of Ortho Device: Arm sling Ortho Device/Splint Location: applied arm sling to pt right arm. pt tolerated application very well.  right arm.  Ortho Device/Splint Interventions: Application, Adjustment   Kristopher Oppenheim 03/11/2017, 9:06 PM

## 2017-03-11 NOTE — Procedures (Signed)
Interventional Radiology Procedure Note  Procedure:  1.) Pelvic arteriogram shows no active extravasation 2.) Prophylactic gel-foam embolization of the left internal obturator artery to minimize risk of re-bleed 3.) Placement of a right CFV 20 cm TL PowerPICC.  Catheter tip in the right internal iliac vein and ready for use.   Complications: None  Estimated Blood Loss: None  Recommendations: - Routine line care - Bedrest - Follow CBC, transfuse as needed - Sheath left in place, keep flushed.  To be removed by IR once Xarelto no longer active  Signed,  Criselda Peaches, MD

## 2017-03-11 NOTE — Progress Notes (Signed)
Orthopedic Tech Progress Note Patient Details:  Tara Bradley 09/29/1922 [id number] Brace completed by bio-tech. Patient ID: Tara Bradley, female   DOB: 1922/07/28, 83 y.o.   MRN: [id number]   Braulio Bosch 03/11/2017, 3:02 PM
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
## 2017-03-12 ENCOUNTER — Inpatient Hospital Stay (HOSPITAL_COMMUNITY): Payer: No Typology Code available for payment source

## 2017-03-12 ENCOUNTER — Encounter (HOSPITAL_COMMUNITY): Payer: Self-pay | Admitting: Radiology

## 2017-03-12 DIAGNOSIS — I7101 Dissection of thoracic aorta: Secondary | ICD-10-CM

## 2017-03-12 LAB — CBC WITH DIFFERENTIAL/PLATELET
Basophils Absolute: 0 10*3/uL (ref 0.0–0.1)
Basophils Relative: 0 %
Eosinophils Absolute: 0.1 10*3/uL (ref 0.0–0.7)
Eosinophils Relative: 1 %
HCT: 29 % — ABNORMAL LOW (ref 36.0–46.0)
Hemoglobin: 9.4 g/dL — ABNORMAL LOW (ref 12.0–15.0)
Lymphocytes Relative: 9 %
Lymphs Abs: 0.8 10*3/uL (ref 0.7–4.0)
MCH: 29.5 pg (ref 26.0–34.0)
MCHC: 32.4 g/dL (ref 30.0–36.0)
MCV: 90.9 fL (ref 78.0–100.0)
Monocytes Absolute: 0.6 10*3/uL (ref 0.1–1.0)
Monocytes Relative: 7 %
Neutro Abs: 7.5 10*3/uL (ref 1.7–7.7)
Neutrophils Relative %: 83 %
Platelets: 138 10*3/uL — ABNORMAL LOW (ref 150–400)
RBC: 3.19 MIL/uL — ABNORMAL LOW (ref 3.87–5.11)
RDW: 15.5 % (ref 11.5–15.5)
WBC: 9 10*3/uL (ref 4.0–10.5)

## 2017-03-12 LAB — BASIC METABOLIC PANEL
Anion gap: 5 (ref 5–15)
BUN: 18 mg/dL (ref 6–20)
CO2: 24 mmol/L (ref 22–32)
Calcium: 7.4 mg/dL — ABNORMAL LOW (ref 8.9–10.3)
Chloride: 104 mmol/L (ref 101–111)
Creatinine, Ser: 0.55 mg/dL (ref 0.44–1.00)
GFR calc Af Amer: >60 mL/min (ref >60)
GFR calc non Af Amer: >60 mL/min (ref >60)
Glucose, Bld: 102 mg/dL — ABNORMAL HIGH (ref 65–99)
Potassium: 3.9 mmol/L (ref 3.5–5.1)
Sodium: 133 mmol/L — ABNORMAL LOW (ref 135–145)

## 2017-03-12 LAB — GLUCOSE, CAPILLARY
Glucose-Capillary: 101 mg/dL — ABNORMAL HIGH (ref 65–99)
Glucose-Capillary: 108 mg/dL — ABNORMAL HIGH (ref 65–99)
Glucose-Capillary: 123 mg/dL — ABNORMAL HIGH (ref 65–99)
Glucose-Capillary: 158 mg/dL — ABNORMAL HIGH (ref 65–99)
Glucose-Capillary: 248 mg/dL — ABNORMAL HIGH (ref 65–99)
Glucose-Capillary: 95 mg/dL (ref 65–99)

## 2017-03-12 MED ORDER — IOPAMIDOL (ISOVUE-370) INJECTION 76%
INTRAVENOUS | Status: AC
  Administered 2017-03-12: 80 mL
  Filled 2017-03-12: qty 100

## 2017-03-12 NOTE — Progress Notes (Signed)
Trauma Service Note  Chief Complaint/Subjective: Doesn't like feeling helpless, no appetite, pain in chest with deep breathing  Objective: Vital signs in last 24 hours: Temp:  [97.8 F (36.6 C)-98.8 F (37.1 C)] 98.7 F (37.1 C) (10/20 0800) Pulse Rate:  [36-145] 145 (10/20 0800) Resp:  [10-32] 16 (10/20 0800) BP: (100-161)/(51-92) 135/80 (10/20 0800) SpO2:  [94 %-100 %] 96 % (10/20 0800) Arterial Line BP: (91-142)/(48-71) 142/71 (10/19 1500) Last BM Date:  (PTA)  Intake/Output from previous day: 10/19 0701 - 10/20 0700 In: 2459 [P.O.:240; I.V.:2219] Out: 865 [Urine:865] Intake/Output this shift: Total I/O In: 50 [I.V.:50] Out: -   General: NAD  Lungs: CTAB  Abd: soft, NT, ND  Extremities: no edema, right arm in sling, right groin line, no hematoma at sheath removal site  Neuro: AOx4  Lab Results: CBC   Recent Labs  03/11/17 1000 03/12/17 0359  WBC 11.0* 9.0  HGB 10.4* 9.4*  HCT 32.0* 29.0*  PLT 180 138*   BMET  Recent Labs  03/11/17 0254 03/12/17 0359  NA 136 133*  K 3.5 3.9  CL 106 104  CO2 21* 24  GLUCOSE 204* 102*  BUN 19 18  CREATININE 0.82 0.55  CALCIUM 7.4* 7.4*   PT/INR  Recent Labs  03/11/17 0254 03/11/17 1000  LABPROT 14.0 14.2  INR 1.09 1.11   ABG No results for input(s): PHART, HCO3 in the last 72 hours.  Invalid input(s): PCO2, PO2  Studies/Results: Dg Pelvis Comp Min 3v  Result Date: 03/11/2017 CLINICAL DATA:  Fracture. EXAM: [facility]ET PELVIS - 3+ VIEW COMPARISON:  CT abdomen pelvis 03/10/2017.  Pelvis 03/10/2017 FINDINGS: Examination is technically limited due to exposure technique resulting in limited visualization vertically of the right hemipelvis. There is evidence of acute displaced fractures of the superior pubic rami bilaterally. Acute appearing fracture of the facial the right innominate bone. Old appearing fractures of the inferior pubic rami and old fracture deformity of the lateral left superior pubic ramus. Old  intramedullary rod fixation of a fracture of the proximal right femur, incompletely visualized. SI joints and symphysis pubis are not displaced. A right femoral central venous catheter is present with tip in the region of the right proximal iliac vein. IMPRESSION: Examination is technically limited due to exposure. There is evidence of acute mildly displaced fractures of the superior pubic rami bilaterally and base of the right innominate bone. Additional old fractures are again visualized. Electronically Signed   By: Lucienne Capers M.D.   On: 03/11/2017 23:02    Anti-infectives: Anti-infectives    None      Medications Scheduled Meds: . iopamidol      . Chlorhexidine Gluconate Cloth  6 each Topical Daily  . digoxin  0.125 mg Oral Daily  . docusate sodium  100 mg Oral BID  . insulin aspart  0-15 Units Subcutaneous Q4H  . metoprolol tartrate  25 mg Oral BID  . pantoprazole  40 mg Oral Daily   Or  . pantoprazole (PROTONIX) IV  40 mg Intravenous Daily  . sodium chloride flush  10-40 mL Intracatheter Q12H   Continuous Infusions: . 0.9 % NaCl with KCl 20 mEq / L 50 mL/hr at 03/12/17 0700   PRN Meds:.acetaminophen, metoprolol tartrate, morphine injection, ondansetron **OR** ondansetron (ZOFRAN) IV, sodium chloride flush, temazepam, traMADol  Assessment/Plan:  Pelvic hematoma with active extravasation - status post emoblization, hemoglobin today 9.4 from 10.4, sheath removed yesterday Right acetabular fracture/Bilateral superior inferior pubic rami fracture - pelvic ring stable, WBAT Acute  on chronic T8 compression fraction - TLSO when available Right scapula fracture/Right acromion fracture - sling Minimally displaced anterior C6 fracture - no collar required Descending thoracic aortic aneurysm with dissection with extravasation versus hematoma - CT today to further evaluate History of atrial fibrillation/On chronic antiplatelet therapy - anticoagulation reversed, cardiology on board,  not on lovenox or anticoagulation yet, no brain or spine injury Insulin-dependent diabetes mellitus - SSI FEN - on carb mod diet, will attempt 2nd peripheral IV and remove groin TLC dispo - ICU today, CTA pending   LOS: 2 days   [place] Trauma Surgeon [phone] Surgery 03/12/2017

## 2017-03-12 NOTE — Progress Notes (Signed)
Patient ID: Tara Bradley, female   DOB: [birth year], 83 y.o.   MRN: [id number] BP 114/77   Pulse (!) 134   Temp 97.8 F (36.6 C) (Oral)   Resp 18   Ht 5\' 4"  (1.626 m)   Wt 62 kg (136 lb 11 oz)   SpO2 98%   BMI 23.46 kg/m  Alert and oriented x 4, speech is clear and fluent Moving all extremities well Plan is as stated
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
## 2017-03-13 LAB — LIPID PANEL
Cholesterol: 132 mg/dL (ref 0–200)
HDL: 48 mg/dL (ref >40)
LDL Cholesterol: 70 mg/dL (ref 0–99)
Total CHOL/HDL Ratio: 2.8 RATIO
Triglycerides: 72 mg/dL (ref <150)
VLDL: 14 mg/dL (ref 0–40)

## 2017-03-13 LAB — GLUCOSE, CAPILLARY
Glucose-Capillary: 143 mg/dL — ABNORMAL HIGH (ref 65–99)
Glucose-Capillary: 112 mg/dL — ABNORMAL HIGH (ref 65–99)
Glucose-Capillary: 121 mg/dL — ABNORMAL HIGH (ref 65–99)
Glucose-Capillary: 210 mg/dL — ABNORMAL HIGH (ref 65–99)
Glucose-Capillary: 187 mg/dL — ABNORMAL HIGH (ref 65–99)
Glucose-Capillary: 87 mg/dL (ref 65–99)

## 2017-03-13 MED ORDER — AMIODARONE HCL 200 MG PO TABS
200.0000 mg | ORAL_TABLET | Freq: Two times a day (BID) | ORAL | Status: DC
  Administered 2017-03-13 – 2017-03-16 (×7): 200 mg via ORAL
  Filled 2017-03-13 (×7): qty 1

## 2017-03-13 NOTE — Progress Notes (Signed)
Subjective/Chief Complaint: No complaints comfortable   Objective: Vital signs in last 24 hours: Temp:  [97.7 F (36.5 C)-98.3 F (36.8 C)] 97.7 F (36.5 C) (10/21 0746) Pulse Rate:  [47-140] 140 (10/21 0800) Resp:  [8-26] 24 (10/21 0800) BP: (99-139)/(64-93) 114/80 (10/21 0800) SpO2:  [92 %-100 %] 97 % (10/21 0800) Last BM Date:  (PTA)  Intake/Output from previous day: 10/20 0701 - 10/21 0700 In: 352.7 [P.O.:240; I.V.:112.7] Out: 1150 [Urine:1150] Intake/Output this shift: No intake/output data recorded.  Exam: Awake and alert Comfortable in appearance Lungs clear CV tachy Abdomen soft, NT  Lab Results:   Recent Labs  03/11/17 1000 03/12/17 0359  WBC 11.0* 9.0  HGB 10.4* 9.4*  HCT 32.0* 29.0*  PLT 180 138*   BMET  Recent Labs  03/11/17 0254 03/12/17 0359  NA 136 133*  K 3.5 3.9  CL 106 104  CO2 21* 24  GLUCOSE 204* 102*  BUN 19 18  CREATININE 0.82 0.55  CALCIUM 7.4* 7.4*   PT/INR  Recent Labs  03/11/17 0254 03/11/17 1000  LABPROT 14.0 14.2  INR 1.09 1.11   ABG No results for input(s): PHART, HCO3 in the last 72 hours.  Invalid input(s): PCO2, PO2  Studies/Results: Dg Pelvis Comp Min 3v  Result Date: 03/11/2017 CLINICAL DATA:  Fracture. EXAM: [facility]ET PELVIS - 3+ VIEW COMPARISON:  CT abdomen pelvis 03/10/2017.  Pelvis 03/10/2017 FINDINGS: Examination is technically limited due to exposure technique resulting in limited visualization vertically of the right hemipelvis. There is evidence of acute displaced fractures of the superior pubic rami bilaterally. Acute appearing fracture of the facial the right innominate bone. Old appearing fractures of the inferior pubic rami and old fracture deformity of the lateral left superior pubic ramus. Old intramedullary rod fixation of a fracture of the proximal right femur, incompletely visualized. SI joints and symphysis pubis are not displaced. A right femoral central venous catheter is present with  tip in the region of the right proximal iliac vein. IMPRESSION: Examination is technically limited due to exposure. There is evidence of acute mildly displaced fractures of the superior pubic rami bilaterally and base of the right innominate bone. Additional old fractures are again visualized. Electronically Signed   By: Lucienne Capers M.D.   On: 03/11/2017 23:02   Ct Angio Chest Aorta W/cm &/or Wo/cm  Result Date: 03/12/2017 CLINICAL DATA:  83 year old female with an acute aortic injury versus penetrating aortic ulcer. Preoperative planning prior to potential TEVAR. EXAM: CT ANGIOGRAPHY CHEST WITH CONTRAST TECHNIQUE: Multidetector CT imaging of the chest was performed using the standard protocol during bolus administration of intravenous contrast. Multiplanar CT image reconstructions and MIPs were obtained to evaluate the vascular anatomy. CONTRAST:  80 mL Isovue 370 COMPARISON:  Prior CT scans of the chest 03/10/2017 and 10/09/2016 FINDINGS: Cardiovascular: Conventional 3 vessel arch anatomy. Mild elongation of the aortic infundibulum and proximal descending thoracic aorta. The aortic root and ascending thoracic aorta are normal in caliber. The transverse aorta is normal in caliber. There is focal aneurysmal dilatation of the proximal descending thoracic aorta measuring up to 4.0 cm which is unchanged compared to the recent prior CT scan from 03/10/2017 but definitively enlarged compared to 3.4 cm on 10/09/2016. Furthermore, the configuration of the aorta at this segment demonstrates expansion of the non pleural surface of the aorta within the media of the aorta as defined by the location of the intimal calcifications consistent with aortic dissection/intramural hematoma. This same configuration with medial displacement of the aortic  calcifications was present on 10/09/16 and less the process (aortic dissection/ intramural hematoma) was present at that time. However, the process has certainly progressed.  The length of the intramural process extends for approximately 6.7 cm, unchanged from the recent prior imaging but progressed compared to 4.9 cm in may. There is a blush of contrast within the otherwise thrombosed dissection/intramural hematoma in close proximity to the right T6 intercostal artery at the 8 o'clock position. The descending thoracic aorta measures approximately 3 cm in diameter distal to the intramural process. Robust central pulmonary arteries. No evidence of pulmonary embolus. Atherosclerotic calcifications noted along the course of the left anterior descending coronary artery. Cardiomegaly with biatrial enlargement. Mild thickening of the mitral valve. No pericardial effusion. Mediastinum/Nodes: Incidental note is again made of a left-sided thyroid nodule measuring approximately 1.3 cm. No definitive follow-up is required for this abnormality. No mediastinal hematoma or hemorrhage. No suspicious lymphadenopathy. Lungs/Pleura: Small bilateral simple pleural effusions with associated dependent atelectasis. The lungs are otherwise clear. No pulmonary edema, consolidation or suspicious nodule or mass. Upper Abdomen: No acute abnormality. Musculoskeletal: No new findings compared to recent prior imaging. Acute on chronic and T8 fracture with near vertebra plana appearance again noted. Review of the MIP images confirms the above findings. IMPRESSION: 1. 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 Whether the progression is acute and related to the patient's recent trauma, or a slower process which has occurred over the preceding 5 months is difficult to assess. Regardless, the overall diameter of the affected segment of the descending thoracic aorta has enlarged from 3.4 cm to 4.0 cm and the length of  the intramural process has propagated from 4.9 cm to 6.7 cm over the past five months which is a relatively rapid change. There has is no evidence of progression over the last 2 days compared to 03/10/2017. No evidence of mediastinal hematoma or hemothorax. 2. Coronary artery calcifications. 3. Cardiomegaly with biatrial enlargement. 4. Interval development of small bilateral pleural effusions with associated dependent atelectasis. 5. Stable acute on chronic T8 fracture. Aortic Atherosclerosis (ICD10-I70.0). Signed, Criselda Peaches, MD [facility] Radiology Electronically Signed   By: Jacqulynn Cadet M.D.   On: 03/12/2017 10:29    Anti-infectives: Anti-infectives    None      Assessment/Plan:  Pelvic hematoma with active extravasation - status post emoblization, repeat Hgb tomorrow Right acetabular fracture/Bilateral superior inferior pubic rami fracture - pelvic ring stable, WBAT Acute on chronic T8 compression fraction - TLSO when available Right scapula fracture/Right acromion fracture - sling Minimally displaced anterior C6 fracture - no collar required AAA -Vasc surg has seen History of atrial fibrillation/On chronic antiplatelet therapy - anticoagulation reversed, cardiology on board, not on lovenox or anticoagulation yet, no brain or spine injury Insulin-dependent diabetes mellitus - SSI FEN - on carb mod diet, will attempt 2nd peripheral IV and remove groin TLC    LOS: 3 days    Tara Bradley A 03/13/2017
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
## 2017-03-13 NOTE — Progress Notes (Signed)
Patient ID: Tara Bradley, female   DOB: February 04, 1923, 83 y.o.   MRN: [id number] BP 108/77   Pulse (!) 137   Temp 98.1 F (36.7 C) (Oral)   Resp (!) 21   Ht 5\' 4"  (1.626 m)   Wt 62 kg (136 lb 11 oz)   SpO2 94%   BMI 23.46 kg/m  Alert and oriented x 4, speech is clear and fluent Moving all extremities Remain in brace when up No change in exam.

## 2017-03-14 ENCOUNTER — Inpatient Hospital Stay (HOSPITAL_COMMUNITY): Payer: No Typology Code available for payment source

## 2017-03-14 DIAGNOSIS — I716 Thoracoabdominal aortic aneurysm, without rupture: Secondary | ICD-10-CM

## 2017-03-14 DIAGNOSIS — I5032 Chronic diastolic (congestive) heart failure: Secondary | ICD-10-CM

## 2017-03-14 DIAGNOSIS — I361 Nonrheumatic tricuspid (valve) insufficiency: Secondary | ICD-10-CM

## 2017-03-14 DIAGNOSIS — D62 Acute posthemorrhagic anemia: Secondary | ICD-10-CM

## 2017-03-14 DIAGNOSIS — I700 Atherosclerosis of aorta: Secondary | ICD-10-CM

## 2017-03-14 LAB — ECHOCARDIOGRAM COMPLETE
FS: 17 % — AB (ref 28–44)
Height: 64 in
IVS/LV PW RATIO, ED: 0.92
LA ID, A-P, ES: 44 mm
LA diam end sys: 44 mm
LA diam index: 2.65 cm/m2
LA vol A4C: 76.6 ml
LA vol index: 46.6 mL/m2
LA vol: 77.3 mL
LV PW d: 8.94 mm — AB (ref 0.6–1.1)
LVOT area: 2.54 cm2
LVOT diameter: 18 mm
Weight: 2186.96 oz

## 2017-03-14 LAB — CBC
HCT: 31.3 % — ABNORMAL LOW (ref 36.0–46.0)
Hemoglobin: 9.9 g/dL — ABNORMAL LOW (ref 12.0–15.0)
MCH: 29.2 pg (ref 26.0–34.0)
MCHC: 31.6 g/dL (ref 30.0–36.0)
MCV: 92.3 fL (ref 78.0–100.0)
Platelets: 144 10*3/uL — ABNORMAL LOW (ref 150–400)
RBC: 3.39 MIL/uL — ABNORMAL LOW (ref 3.87–5.11)
RDW: 15.5 % (ref 11.5–15.5)
WBC: 8.2 10*3/uL (ref 4.0–10.5)

## 2017-03-14 LAB — GLUCOSE, CAPILLARY
Glucose-Capillary: 125 mg/dL — ABNORMAL HIGH (ref 65–99)
Glucose-Capillary: 129 mg/dL — ABNORMAL HIGH (ref 65–99)
Glucose-Capillary: 173 mg/dL — ABNORMAL HIGH (ref 65–99)
Glucose-Capillary: 240 mg/dL — ABNORMAL HIGH (ref 65–99)
Glucose-Capillary: 207 mg/dL — ABNORMAL HIGH (ref 65–99)
Glucose-Capillary: 135 mg/dL — ABNORMAL HIGH (ref 65–99)

## 2017-03-14 LAB — DIGOXIN LEVEL: Digoxin Level: 0.9 ng/mL (ref 0.8–2.0)

## 2017-03-14 MED ORDER — RIVAROXABAN 15 MG PO TABS
15.0000 mg | ORAL_TABLET | Freq: Every day | ORAL | Status: DC
  Administered 2017-03-14 – 2017-03-18 (×5): 15 mg via ORAL
  Filled 2017-03-14 (×5): qty 1

## 2017-03-14 MED ORDER — HYDROMORPHONE HCL 1 MG/ML IJ SOLN: 0.5000 mg | INTRAMUSCULAR | Status: DC | PRN

## 2017-03-14 MED ORDER — DILTIAZEM HCL 60 MG PO TABS
30.0000 mg | ORAL_TABLET | Freq: Four times a day (QID) | ORAL | Status: DC
  Administered 2017-03-14 – 2017-03-15 (×4): 30 mg via ORAL
  Filled 2017-03-14 (×4): qty 1

## 2017-03-14 MED ORDER — MORPHINE SULFATE (PF) 4 MG/ML IV SOLN
1.0000 mg | INTRAVENOUS | Status: DC | PRN
  Administered 2017-03-14: 2 mg via INTRAVENOUS
  Filled 2017-03-14: qty 1
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
## 2017-03-14 NOTE — Progress Notes (Signed)
  Echocardiogram 2D Echocardiogram has been performed.  Tara Bradley 03/14/2017, 4:57 PM

## 2017-03-14 NOTE — Progress Notes (Signed)
NEUROSURGERY PROGRESS NOTE  Doing well. Complains of appropriate back soreness. Alert and oriented x4. MAE but 3/5 in lowers.   Temp:  [97.1 F (36.2 C)-98.1 F (36.7 C)] 98 F (36.7 C) (10/22 0359) Pulse Rate:  [35-138] 123 (10/22 0700) Resp:  [13-32] 18 (10/22 0700) BP: (109-136)/(60-93) 119/81 (10/22 0700) SpO2:  [93 %-100 %] 98 % (10/22 0700)  Plan: Will work with PT today. Remain in TLSO brace when up.   Eleonore Chiquito, NP 03/14/2017 8:08 AM
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
## 2017-03-15 ENCOUNTER — Inpatient Hospital Stay (HOSPITAL_COMMUNITY): Payer: No Typology Code available for payment source

## 2017-03-15 LAB — GLUCOSE, CAPILLARY
Glucose-Capillary: 135 mg/dL — ABNORMAL HIGH (ref 65–99)
Glucose-Capillary: 124 mg/dL — ABNORMAL HIGH (ref 65–99)
Glucose-Capillary: 237 mg/dL — ABNORMAL HIGH (ref 65–99)
Glucose-Capillary: 175 mg/dL — ABNORMAL HIGH (ref 65–99)
Glucose-Capillary: 208 mg/dL — ABNORMAL HIGH (ref 65–99)
Glucose-Capillary: 201 mg/dL — ABNORMAL HIGH (ref 65–99)

## 2017-03-15 LAB — CBC WITH DIFFERENTIAL/PLATELET
Basophils Absolute: 0 10*3/uL (ref 0.0–0.1)
Basophils Relative: 0 %
Eosinophils Absolute: 0.1 10*3/uL (ref 0.0–0.7)
Eosinophils Relative: 1 %
HCT: 32.3 % — ABNORMAL LOW (ref 36.0–46.0)
Hemoglobin: 10 g/dL — ABNORMAL LOW (ref 12.0–15.0)
Lymphocytes Relative: 10 %
Lymphs Abs: 0.7 10*3/uL (ref 0.7–4.0)
MCH: 28.7 pg (ref 26.0–34.0)
MCHC: 31 g/dL (ref 30.0–36.0)
MCV: 92.6 fL (ref 78.0–100.0)
Monocytes Absolute: 0.9 10*3/uL (ref 0.1–1.0)
Monocytes Relative: 13 %
Neutro Abs: 5 10*3/uL (ref 1.7–7.7)
Neutrophils Relative %: 76 %
Platelets: 170 10*3/uL (ref 150–400)
RBC: 3.49 MIL/uL — ABNORMAL LOW (ref 3.87–5.11)
RDW: 15.8 % — ABNORMAL HIGH (ref 11.5–15.5)
WBC: 6.7 10*3/uL (ref 4.0–10.5)

## 2017-03-15 LAB — BASIC METABOLIC PANEL
Anion gap: 7 (ref 5–15)
BUN: 10 mg/dL (ref 6–20)
CO2: 28 mmol/L (ref 22–32)
Calcium: 8.4 mg/dL — ABNORMAL LOW (ref 8.9–10.3)
Chloride: 102 mmol/L (ref 101–111)
Creatinine, Ser: 0.5 mg/dL (ref 0.44–1.00)
GFR calc Af Amer: >60 mL/min (ref >60)
GFR calc non Af Amer: >60 mL/min (ref >60)
Glucose, Bld: 156 mg/dL — ABNORMAL HIGH (ref 65–99)
Potassium: 5.1 mmol/L (ref 3.5–5.1)
Sodium: 137 mmol/L (ref 135–145)

## 2017-03-15 MED ORDER — FUROSEMIDE 20 MG PO TABS
20.0000 mg | ORAL_TABLET | Freq: Every day | ORAL | Status: DC
  Administered 2017-03-15 – 2017-03-18 (×4): 20 mg via ORAL
  Filled 2017-03-15 (×4): qty 1

## 2017-03-15 MED ORDER — ENSURE ENLIVE PO LIQD
237.0000 mL | Freq: Two times a day (BID) | ORAL | Status: DC
  Administered 2017-03-16 – 2017-03-18 (×3): 237 mL via ORAL

## 2017-03-15 MED ORDER — DILTIAZEM HCL 60 MG PO TABS
90.0000 mg | ORAL_TABLET | Freq: Four times a day (QID) | ORAL | Status: DC
  Administered 2017-03-15 – 2017-03-16 (×7): 90 mg via ORAL
  Filled 2017-03-15 (×7): qty 2

## 2017-03-15 NOTE — Progress Notes (Signed)
NEUROSURGERY PROGRESS NOTE  Doing well. Complains of appropriate back soreness. Worked with PT yesterday and did fairly well. Sat in chair for about an hour.  Temp:  [97.7 F (36.5 C)-98.5 F (36.9 C)] 98.4 F (36.9 C) (10/23 0400) Pulse Rate:  [91-154] 124 (10/23 0700) Resp:  [15-33] 22 (10/23 0700) BP: (100-152)/(55-114) 128/89 (10/23 0700) SpO2:  [92 %-99 %] 98 % (10/23 0700)  Plan: No new nsgy recom  Eleonore Chiquito, NP 03/15/2017 7:53 AM
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
## 2017-03-15 NOTE — Clinical Social Work Note (Signed)
CSW provided bed offers to patient. She will review with supports.  [place] Scrape, [place]

## 2017-03-16 DIAGNOSIS — I7102 Dissection of abdominal aorta: Secondary | ICD-10-CM

## 2017-03-16 DIAGNOSIS — E440 Moderate protein-calorie malnutrition: Secondary | ICD-10-CM | POA: Insufficient documentation

## 2017-03-16 LAB — GLUCOSE, CAPILLARY
Glucose-Capillary: 85 mg/dL (ref 65–99)
Glucose-Capillary: 145 mg/dL — ABNORMAL HIGH (ref 65–99)
Glucose-Capillary: 246 mg/dL — ABNORMAL HIGH (ref 65–99)
Glucose-Capillary: 274 mg/dL — ABNORMAL HIGH (ref 65–99)
Glucose-Capillary: 128 mg/dL — ABNORMAL HIGH (ref 65–99)

## 2017-03-16 LAB — BASIC METABOLIC PANEL
Anion gap: 9 (ref 5–15)
BUN: 18 mg/dL (ref 6–20)
CO2: 28 mmol/L (ref 22–32)
Calcium: 8.4 mg/dL — ABNORMAL LOW (ref 8.9–10.3)
Chloride: 97 mmol/L — ABNORMAL LOW (ref 101–111)
Creatinine, Ser: 0.49 mg/dL (ref 0.44–1.00)
GFR calc Af Amer: >60 mL/min (ref >60)
GFR calc non Af Amer: >60 mL/min (ref >60)
Glucose, Bld: 90 mg/dL (ref 65–99)
Potassium: 4.4 mmol/L (ref 3.5–5.1)
Sodium: 134 mmol/L — ABNORMAL LOW (ref 135–145)

## 2017-03-16 LAB — CBC
HCT: 29.1 % — ABNORMAL LOW (ref 36.0–46.0)
Hemoglobin: 9.2 g/dL — ABNORMAL LOW (ref 12.0–15.0)
MCH: 29.1 pg (ref 26.0–34.0)
MCHC: 31.6 g/dL (ref 30.0–36.0)
MCV: 92.1 fL (ref 78.0–100.0)
Platelets: 199 10*3/uL (ref 150–400)
RBC: 3.16 MIL/uL — ABNORMAL LOW (ref 3.87–5.11)
RDW: 15.9 % — ABNORMAL HIGH (ref 11.5–15.5)
WBC: 9 10*3/uL (ref 4.0–10.5)

## 2017-03-16 MED ORDER — INSULIN ASPART 100 UNIT/ML ~~LOC~~ SOLN
0.0000 [IU] | Freq: Three times a day (TID) | SUBCUTANEOUS | Status: DC
  Administered 2017-03-16: 11 [IU] via SUBCUTANEOUS
  Administered 2017-03-17: 4 [IU] via SUBCUTANEOUS
  Administered 2017-03-17: 3 [IU] via SUBCUTANEOUS
  Administered 2017-03-17: 4 [IU] via SUBCUTANEOUS
  Administered 2017-03-18: 7 [IU] via SUBCUTANEOUS
  Administered 2017-03-18: 4 [IU] via SUBCUTANEOUS

## 2017-03-16 MED ORDER — INSULIN GLARGINE 100 UNIT/ML ~~LOC~~ SOLN
10.0000 [IU] | Freq: Every day | SUBCUTANEOUS | Status: DC
  Administered 2017-03-16 – 2017-03-17 (×2): 10 [IU] via SUBCUTANEOUS
  Filled 2017-03-16 (×4): qty 0.1

## 2017-03-16 MED ORDER — DILTIAZEM HCL ER COATED BEADS 360 MG PO CP24
360.0000 mg | ORAL_CAPSULE | Freq: Every day | ORAL | Status: DC
  Administered 2017-03-17 – 2017-03-18 (×2): 360 mg via ORAL
  Filled 2017-03-16: qty 1
  Filled 2017-03-16: qty 2
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
## 2017-03-16 NOTE — Progress Notes (Signed)
NEUROSURGERY PROGRESS NOTE  Doing well. Complains of appropriate back soreness. Worked with PT yesteday.   Temp:  [97.9 F (36.6 C)-98.9 F (37.2 C)] 98 F (36.7 C) (10/24 0400) Pulse Rate:  [49-127] 95 (10/24 0700) Resp:  [18-34] 19 (10/24 0700) BP: (101-166)/(49-102) 101/62 (10/24 0700) SpO2:  [92 %-98 %] 94 % (10/24 0700)  Plan: No new nsgy recom  Eleonore Chiquito, NP 03/16/2017 7:38 AM

## 2017-03-16 NOTE — Progress Notes (Signed)
Trauma Service Note  Subjective: Patient is awake and alert.  Very clear about her wishes   Wants to go to SNF, but unsure of where.  Objective: Vital signs in last 24 hours: Temp:  [97.9 F (36.6 C)-98.9 F (37.2 C)] 98 F (36.7 C) (10/24 0400) Pulse Rate:  [49-127] 115 (10/24 0800) Resp:  [18-34] 25 (10/24 0800) BP: (101-166)/(49-102) 101/60 (10/24 0800) SpO2:  [92 %-98 %] 94 % (10/24 0800) Last BM Date:  (pta)  Intake/Output from previous day: 10/23 0701 - 10/24 0700 In: 840 [P.O.:600; I.V.:240] Out: 2100 [Urine:2100] Intake/Output this shift: Total I/O In: 10 [I.V.:10] Out: -   General: No distress..  Lungs: Clear to auscultation.  Abd: Benign  Extremities: No clinical sings or symptoms of DVT  Neuro: INtact  Lab Results: CBC   Recent Labs  03/15/17 0841 03/16/17 0226  WBC 6.7 9.0  HGB 10.0* 9.2*  HCT 32.3* 29.1*  PLT 170 199   BMET  Recent Labs  03/15/17 0928 03/16/17 0226  NA 137 134*  K 5.1 4.4  CL 102 97*  CO2 28 28  GLUCOSE 156* 90  BUN 10 18  CREATININE 0.50 0.49  CALCIUM 8.4* 8.4*   PT/INR No results for input(s): LABPROT, INR in the last 72 hours. ABG No results for input(s): PHART, HCO3 in the last 72 hours.  Invalid input(s): PCO2, PO2  Studies/Results: Dg Pelvis Comp Min 3v  Result Date: 03/15/2017 CLINICAL DATA:  Pelvic fractures, followup EXAM: JUDET PELVIS - 3+ VIEW COMPARISON:  CT abdomen pelvis of 03/10/2014 FINDINGS: The right acetabular fracture again is noted involving anterior column as noted on recent CT. Additionally multiple fractures involve the right superior and inferior pelvic rami bilaterally. The right sacral fracture which evidently is old is not well seen by plain film. Intramedullary nail is noted in the right femur. IMPRESSION: 1. No apparent change in pelvic fractures as noted above. 2. Prior pinning of right femoral fracture with IM nail. Electronically Signed   By: Ivar Drape M.D.   On: 03/15/2017 15:49     Anti-infectives: Anti-infectives    None      Assessment/Plan: s/p  Transfer to NP  LOS: 6 days   Kathryne Eriksson. Dahlia Bailiff, MD, FACS [phone] Trauma Surgeon 03/16/2017

## 2017-03-17 LAB — GLUCOSE, CAPILLARY
Glucose-Capillary: 121 mg/dL — ABNORMAL HIGH (ref 65–99)
Glucose-Capillary: 155 mg/dL — ABNORMAL HIGH (ref 65–99)
Glucose-Capillary: 179 mg/dL — ABNORMAL HIGH (ref 65–99)
Glucose-Capillary: 154 mg/dL — ABNORMAL HIGH (ref 65–99)

## 2017-03-17 MED ORDER — TEMAZEPAM 15 MG PO CAPS
30.0000 mg | ORAL_CAPSULE | Freq: Every evening | ORAL | Status: DC | PRN
  Administered 2017-03-17: 30 mg via ORAL
  Filled 2017-03-17: qty 2

## 2017-03-17 MED ORDER — DILTIAZEM HCL 60 MG PO TABS
90.0000 mg | ORAL_TABLET | Freq: Four times a day (QID) | ORAL | Status: AC
  Administered 2017-03-17 (×2): 90 mg via ORAL
  Filled 2017-03-17 (×2): qty 1
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
## 2017-03-17 NOTE — Clinical Social Work Note (Signed)
Clinical Social Worker continuing to follow patient and family for support and discharge planning needs.  CSW spoke with patient at bedside and she would like to accept the bed offer from [facility].  CSW spoke with facility liaison who will plan on patient admission.  Patient with available bed over the weekend if needed.  CSW remains available for support and to facilitate patient discharge needs once medically stable.  Barbette Or, [place]
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
## 2017-03-17 NOTE — Progress Notes (Signed)
Patient arrived to 4NP03, telemetry verified with CCMD. Questions answered. Continue to monitor patient.
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
## 2017-03-18 DIAGNOSIS — S32401A Unspecified fracture of right acetabulum, initial encounter for closed fracture: Secondary | ICD-10-CM | POA: Diagnosis not present

## 2017-03-18 DIAGNOSIS — S8990XA Unspecified injury of unspecified lower leg, initial encounter: Secondary | ICD-10-CM | POA: Diagnosis not present

## 2017-03-18 DIAGNOSIS — M25511 Pain in right shoulder: Secondary | ICD-10-CM | POA: Diagnosis not present

## 2017-03-18 DIAGNOSIS — I700 Atherosclerosis of aorta: Secondary | ICD-10-CM | POA: Diagnosis not present

## 2017-03-18 DIAGNOSIS — R42 Dizziness and giddiness: Secondary | ICD-10-CM | POA: Diagnosis not present

## 2017-03-18 DIAGNOSIS — M542 Cervicalgia: Secondary | ICD-10-CM | POA: Diagnosis not present

## 2017-03-18 DIAGNOSIS — Z789 Other specified health status: Secondary | ICD-10-CM | POA: Diagnosis not present

## 2017-03-18 DIAGNOSIS — F5102 Adjustment insomnia: Secondary | ICD-10-CM | POA: Diagnosis not present

## 2017-03-18 DIAGNOSIS — R195 Other fecal abnormalities: Secondary | ICD-10-CM | POA: Diagnosis not present

## 2017-03-18 DIAGNOSIS — S22069D Unspecified fracture of T7-T8 vertebra, subsequent encounter for fracture with routine healing: Secondary | ICD-10-CM | POA: Diagnosis not present

## 2017-03-18 DIAGNOSIS — M6281 Muscle weakness (generalized): Secondary | ICD-10-CM | POA: Diagnosis not present

## 2017-03-18 DIAGNOSIS — E876 Hypokalemia: Secondary | ICD-10-CM | POA: Diagnosis not present

## 2017-03-18 DIAGNOSIS — M549 Dorsalgia, unspecified: Secondary | ICD-10-CM | POA: Diagnosis not present

## 2017-03-18 DIAGNOSIS — R7989 Other specified abnormal findings of blood chemistry: Secondary | ICD-10-CM | POA: Diagnosis not present

## 2017-03-18 DIAGNOSIS — I10 Essential (primary) hypertension: Secondary | ICD-10-CM | POA: Diagnosis not present

## 2017-03-18 DIAGNOSIS — S32810S Multiple fractures of pelvis with stable disruption of pelvic ring, sequela: Secondary | ICD-10-CM | POA: Diagnosis not present

## 2017-03-18 DIAGNOSIS — M7989 Other specified soft tissue disorders: Secondary | ICD-10-CM | POA: Diagnosis not present

## 2017-03-18 DIAGNOSIS — R2681 Unsteadiness on feet: Secondary | ICD-10-CM | POA: Diagnosis not present

## 2017-03-18 DIAGNOSIS — S12501S Unspecified nondisplaced fracture of sixth cervical vertebra, sequela: Secondary | ICD-10-CM | POA: Diagnosis not present

## 2017-03-18 DIAGNOSIS — R600 Localized edema: Secondary | ICD-10-CM | POA: Diagnosis not present

## 2017-03-18 DIAGNOSIS — S32810D Multiple fractures of pelvis with stable disruption of pelvic ring, subsequent encounter for fracture with routine healing: Secondary | ICD-10-CM | POA: Diagnosis not present

## 2017-03-18 DIAGNOSIS — S42101A Fracture of unspecified part of scapula, right shoulder, initial encounter for closed fracture: Secondary | ICD-10-CM | POA: Diagnosis not present

## 2017-03-18 DIAGNOSIS — G8911 Acute pain due to trauma: Secondary | ICD-10-CM | POA: Diagnosis not present

## 2017-03-18 DIAGNOSIS — Z4789 Encounter for other orthopedic aftercare: Secondary | ICD-10-CM | POA: Diagnosis not present

## 2017-03-18 DIAGNOSIS — Z9114 Patient's other noncompliance with medication regimen: Secondary | ICD-10-CM | POA: Diagnosis not present

## 2017-03-18 DIAGNOSIS — S42121S Displaced fracture of acromial process, right shoulder, sequela: Secondary | ICD-10-CM | POA: Diagnosis not present

## 2017-03-18 DIAGNOSIS — R102 Pelvic and perineal pain: Secondary | ICD-10-CM | POA: Diagnosis not present

## 2017-03-18 DIAGNOSIS — M25559 Pain in unspecified hip: Secondary | ICD-10-CM | POA: Diagnosis not present

## 2017-03-18 DIAGNOSIS — R2689 Other abnormalities of gait and mobility: Secondary | ICD-10-CM | POA: Diagnosis not present

## 2017-03-18 DIAGNOSIS — S22069S Unspecified fracture of T7-T8 vertebra, sequela: Secondary | ICD-10-CM | POA: Diagnosis not present

## 2017-03-18 DIAGNOSIS — I481 Persistent atrial fibrillation: Secondary | ICD-10-CM | POA: Diagnosis not present

## 2017-03-18 DIAGNOSIS — S12501D Unspecified nondisplaced fracture of sixth cervical vertebra, subsequent encounter for fracture with routine healing: Secondary | ICD-10-CM | POA: Diagnosis not present

## 2017-03-18 DIAGNOSIS — S300XXA Contusion of lower back and pelvis, initial encounter: Secondary | ICD-10-CM | POA: Diagnosis not present

## 2017-03-18 DIAGNOSIS — N390 Urinary tract infection, site not specified: Secondary | ICD-10-CM | POA: Diagnosis not present

## 2017-03-18 DIAGNOSIS — F4321 Adjustment disorder with depressed mood: Secondary | ICD-10-CM | POA: Diagnosis not present

## 2017-03-18 DIAGNOSIS — R0609 Other forms of dyspnea: Secondary | ICD-10-CM | POA: Diagnosis not present

## 2017-03-18 DIAGNOSIS — F419 Anxiety disorder, unspecified: Secondary | ICD-10-CM | POA: Diagnosis not present

## 2017-03-18 DIAGNOSIS — S32599A Other specified fracture of unspecified pubis, initial encounter for closed fracture: Secondary | ICD-10-CM | POA: Diagnosis not present

## 2017-03-18 DIAGNOSIS — I482 Chronic atrial fibrillation: Secondary | ICD-10-CM | POA: Diagnosis not present

## 2017-03-18 DIAGNOSIS — S22060D Wedge compression fracture of T7-T8 vertebra, subsequent encounter for fracture with routine healing: Secondary | ICD-10-CM | POA: Diagnosis not present

## 2017-03-18 DIAGNOSIS — R41841 Cognitive communication deficit: Secondary | ICD-10-CM | POA: Diagnosis not present

## 2017-03-18 DIAGNOSIS — S42121D Displaced fracture of acromial process, right shoulder, subsequent encounter for fracture with routine healing: Secondary | ICD-10-CM | POA: Diagnosis not present

## 2017-03-18 DIAGNOSIS — E119 Type 2 diabetes mellitus without complications: Secondary | ICD-10-CM | POA: Diagnosis not present

## 2017-03-18 DIAGNOSIS — R21 Rash and other nonspecific skin eruption: Secondary | ICD-10-CM | POA: Diagnosis not present

## 2017-03-18 DIAGNOSIS — I4891 Unspecified atrial fibrillation: Secondary | ICD-10-CM | POA: Diagnosis not present

## 2017-03-18 LAB — BASIC METABOLIC PANEL
Anion gap: 8 (ref 5–15)
BUN: 16 mg/dL (ref 6–20)
CO2: 30 mmol/L (ref 22–32)
Calcium: 8.4 mg/dL — ABNORMAL LOW (ref 8.9–10.3)
Chloride: 92 mmol/L — ABNORMAL LOW (ref 101–111)
Creatinine, Ser: 0.52 mg/dL (ref 0.44–1.00)
GFR calc Af Amer: >60 mL/min (ref >60)
GFR calc non Af Amer: >60 mL/min (ref >60)
Glucose, Bld: 132 mg/dL — ABNORMAL HIGH (ref 65–99)
Potassium: 4.4 mmol/L (ref 3.5–5.1)
Sodium: 130 mmol/L — ABNORMAL LOW (ref 135–145)

## 2017-03-18 LAB — CBC
HCT: 28.8 % — ABNORMAL LOW (ref 36.0–46.0)
Hemoglobin: 9.2 g/dL — ABNORMAL LOW (ref 12.0–15.0)
MCH: 29.1 pg (ref 26.0–34.0)
MCHC: 31.9 g/dL (ref 30.0–36.0)
MCV: 91.1 fL (ref 78.0–100.0)
Platelets: 261 10*3/uL (ref 150–400)
RBC: 3.16 MIL/uL — ABNORMAL LOW (ref 3.87–5.11)
RDW: 15.8 % — ABNORMAL HIGH (ref 11.5–15.5)
WBC: 9.7 10*3/uL (ref 4.0–10.5)

## 2017-03-18 LAB — GLUCOSE, CAPILLARY
Glucose-Capillary: 108 mg/dL — ABNORMAL HIGH (ref 65–99)
Glucose-Capillary: 185 mg/dL — ABNORMAL HIGH (ref 65–99)
Glucose-Capillary: 215 mg/dL — ABNORMAL HIGH (ref 65–99)

## 2017-03-18 MED ORDER — PANTOPRAZOLE SODIUM 40 MG PO TBEC: 40.0000 mg | DELAYED_RELEASE_TABLET | Freq: Every day | ORAL | Status: DC

## 2017-03-18 MED ORDER — METOPROLOL TARTRATE 25 MG PO TABS
25.0000 mg | ORAL_TABLET | Freq: Once | ORAL | Status: AC
  Administered 2017-03-18: 25 mg via ORAL
  Filled 2017-03-18: qty 1

## 2017-03-18 MED ORDER — METOPROLOL TARTRATE 50 MG PO TABS: 50.0000 mg | ORAL_TABLET | Freq: Two times a day (BID) | ORAL | Status: DC

## 2017-03-18 MED ORDER — FUROSEMIDE 20 MG PO TABS: 20.0000 mg | ORAL_TABLET | Freq: Every day | ORAL | Status: DC

## 2017-03-18 MED ORDER — TRAMADOL HCL 50 MG PO TABS: 50.0000 mg | ORAL_TABLET | Freq: Four times a day (QID) | ORAL | 0 refills | Status: DC | PRN

## 2017-03-18 MED ORDER — FERROUS GLUCONATE 324 (38 FE) MG PO TABS
324.0000 mg | ORAL_TABLET | Freq: Two times a day (BID) | ORAL | Status: DC
  Administered 2017-03-18 (×2): 324 mg via ORAL
  Filled 2017-03-18 (×3): qty 1

## 2017-03-18 MED ORDER — FERROUS GLUCONATE 324 (38 FE) MG PO TABS: 324.0000 mg | ORAL_TABLET | Freq: Two times a day (BID) | ORAL | 3 refills | Status: DC

## 2017-03-18 MED ORDER — ADULT MULTIVITAMIN W/MINERALS CH
1.0000 | ORAL_TABLET | Freq: Every day | ORAL | Status: DC
  Administered 2017-03-18: 1 via ORAL
  Filled 2017-03-18: qty 1

## 2017-03-18 MED ORDER — DILTIAZEM HCL ER COATED BEADS 360 MG PO CP24: 360.0000 mg | ORAL_CAPSULE | Freq: Every day | ORAL | Status: DC

## 2017-03-18 MED ORDER — ONDANSETRON 4 MG PO TBDP: 4.0000 mg | ORAL_TABLET | Freq: Four times a day (QID) | ORAL | 0 refills | Status: DC | PRN

## 2017-03-18 MED ORDER — ENSURE ENLIVE PO LIQD: 237.0000 mL | Freq: Two times a day (BID) | ORAL | 12 refills | Status: DC

## 2017-03-18 MED ORDER — ACETAMINOPHEN 325 MG PO TABS: 650.0000 mg | ORAL_TABLET | ORAL | Status: DC | PRN
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
## 2017-03-18 NOTE — Progress Notes (Signed)
Transport here for patient to take to [facility].  Pt denies pain.  No s/s of any acute distress.

## 2017-03-18 NOTE — Progress Notes (Signed)
Attempted to call [facility] to give report.  No answer.

## 2017-03-18 NOTE — Clinical Social Work Placement (Signed)
   CLINICAL SOCIAL WORK PLACEMENT  NOTE  Date:  03/18/2017  Patient Details  Name: Tara Bradley MRN: [id number] Date of Birth: 1922/05/26  Clinical Social Work is seeking post-discharge placement for this patient at the [place] level of care (*CSW will initial, date and re-position this form in  chart as items are completed):  Yes   Patient/family provided with [place] Work Department's list of facilities offering this level of care within the geographic area requested by the patient (or if unable, by the patient's family).  Yes   Patient/family informed of their freedom to choose among providers that offer the needed level of care, that participate in Medicare, Medicaid or managed care program needed by the patient, have an available bed and are willing to accept the patient.  Yes   Patient/family informed of [place]'s ownership interest in [facility] and [facility], as well as of the fact that they are under no obligation to receive care at these facilities.  PASRR submitted to EDS on 03/14/17     PASRR number received on 03/14/17     Existing PASRR number confirmed on       FL2 transmitted to all facilities in geographic area requested by pt/family on 03/14/17     FL2 transmitted to all facilities within larger geographic area on       Patient informed that his/her managed care company has contracts with or will negotiate with certain facilities, including the following:        Yes   Patient/family informed of bed offers received.  Patient chooses bed at [facility]     Physician recommends and patient chooses bed at [facility]    Patient to be transferred to [facility] on 03/18/17.  Patient to be transferred to facility by Ambulance     Patient family notified on 03/18/17 of transfer.  Name of family member notified:  patient to update family     PHYSICIAN Please sign FL2     Additional Comment:   Barbette Or, [place]

## 2017-03-18 NOTE — Progress Notes (Signed)
Attempted to call report for the third time.  No answer at nursing station.  At this point, if nurse wants report, they can call.

## 2017-03-18 NOTE — Clinical Social Work Note (Signed)
Clinical Social Worker facilitated patient discharge including contacting patient family and facility to confirm patient discharge plans.  Clinical information faxed to facility and family agreeable with plan.  CSW arranged ambulance transport via [facility] to [facility].  RN to call report prior to discharge.  Clinical Social Worker will sign off for now as social work intervention is no longer needed. Please consult us again if new need arises.  Jesse Kyo Cocuzza, LCSW [phone] 

## 2017-03-18 NOTE — Progress Notes (Signed)
Throughout most of the day has adequate heart rate control, but early in the morning at periods of rapid ventricular response in the 110-120 range.  I think we may be able to solve this simply by administering the dose of diltiazem earlier.  Since her blood pressure is high we also able to slightly increase her beta-blocker dose.  These medications are preferable to using more toxic medications such as amiodarone and digoxin.  Will make arrangements for office follow-up in 3-4 weeks, when hopefully she will be out of the nursing facility. Sanda Klein, MD, [facility] [phone] office [phone] pager
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
## 2017-03-18 NOTE — Progress Notes (Signed)
Trauma Service Note  Subjective: Patient conversant, and I talked with her daughter in [place] this AM.  The patient's granddaughter will be here on Sunday.  Objective: Vital signs in last 24 hours: Temp:  [97.7 F (36.5 C)-98.7 F (37.1 C)] 98.7 F (37.1 C) (10/26 0734) Pulse Rate:  [43-120] 111 (10/26 0800) Resp:  [18-27] 24 (10/26 0800) BP: (106-142)/(53-99) 126/75 (10/26 0800) SpO2:  [92 %-98 %] 96 % (10/26 0800) Last BM Date:  (pta)  Intake/Output from previous day: 10/25 0701 - 10/26 0700 In: 910 [P.O.:660; I.V.:250] Out: 75 [Urine:75] Intake/Output this shift: Total I/O In: 10 [I.V.:10] Out: 425 [Urine:425]  General: CPM.    Lungs: Clear.  Still in atrial fibrillation with HR 120-130 this morning.    Abd: Benign  Extremities: No changes  Neuro: Intact  Lab Results: CBC   Recent Labs  03/16/17 0226 03/18/17 0403  WBC 9.0 9.7  HGB 9.2* 9.2*  HCT 29.1* 28.8*  PLT 199 261   BMET  Recent Labs  03/16/17 0226 03/18/17 0403  NA 134* 130*  K 4.4 4.4  CL 97* 92*  CO2 28 30  GLUCOSE 90 132*  BUN 18 16  CREATININE 0.49 0.52  CALCIUM 8.4* 8.4*   PT/INR No results for input(s): LABPROT, INR in the last 72 hours. ABG No results for input(s): PHART, HCO3 in the last 72 hours.  Invalid input(s): PCO2, PO2  Studies/Results: No results found.  Anti-infectives: Anti-infectives    None      Assessment/Plan: s/p  Apparently has a place in [facility], and she can go there if her heart rate is controlled.    Cardiology to see the patient today.  LOS: 8 days   Kathryne Eriksson. Dahlia Bailiff, MD, FACS [phone] Trauma Surgeon 03/18/2017

## 2017-03-18 NOTE — Progress Notes (Signed)
Pt ready for pickup to go to [facility].  Attempted to call report again at this time and again, no answer.  Left voicemail.  AVS printed to send to facility.  Pt has no ss of any acute distress.  Denies pain at this time.

## 2017-03-20 LAB — GLUCOSE, CAPILLARY: Glucose-Capillary: 182 mg/dL — ABNORMAL HIGH (ref 65–99)

## 2017-03-21 DIAGNOSIS — S42101A Fracture of unspecified part of scapula, right shoulder, initial encounter for closed fracture: Secondary | ICD-10-CM | POA: Diagnosis not present

## 2017-03-21 DIAGNOSIS — S32401A Unspecified fracture of right acetabulum, initial encounter for closed fracture: Secondary | ICD-10-CM | POA: Diagnosis not present

## 2017-03-21 DIAGNOSIS — S32599A Other specified fracture of unspecified pubis, initial encounter for closed fracture: Secondary | ICD-10-CM | POA: Diagnosis not present

## 2017-03-25 ENCOUNTER — Telehealth: Payer: Self-pay | Admitting: Cardiology

## 2017-03-25 DIAGNOSIS — R102 Pelvic and perineal pain: Secondary | ICD-10-CM | POA: Diagnosis not present

## 2017-03-25 DIAGNOSIS — M25511 Pain in right shoulder: Secondary | ICD-10-CM | POA: Diagnosis not present

## 2017-03-25 DIAGNOSIS — M6281 Muscle weakness (generalized): Secondary | ICD-10-CM | POA: Diagnosis not present

## 2017-03-25 DIAGNOSIS — M542 Cervicalgia: Secondary | ICD-10-CM | POA: Diagnosis not present

## 2017-03-25 DIAGNOSIS — R2681 Unsteadiness on feet: Secondary | ICD-10-CM | POA: Diagnosis not present

## 2017-03-25 DIAGNOSIS — I4891 Unspecified atrial fibrillation: Secondary | ICD-10-CM | POA: Diagnosis not present

## 2017-03-25 DIAGNOSIS — M549 Dorsalgia, unspecified: Secondary | ICD-10-CM | POA: Diagnosis not present

## 2017-03-25 NOTE — Telephone Encounter (Signed)
Closed Encounter  °

## 2017-03-28 DIAGNOSIS — Z789 Other specified health status: Secondary | ICD-10-CM | POA: Diagnosis not present

## 2017-03-28 DIAGNOSIS — M25511 Pain in right shoulder: Secondary | ICD-10-CM | POA: Diagnosis not present

## 2017-03-28 DIAGNOSIS — M6281 Muscle weakness (generalized): Secondary | ICD-10-CM | POA: Diagnosis not present

## 2017-03-28 DIAGNOSIS — R2681 Unsteadiness on feet: Secondary | ICD-10-CM | POA: Diagnosis not present

## 2017-03-28 DIAGNOSIS — R0609 Other forms of dyspnea: Secondary | ICD-10-CM | POA: Diagnosis not present

## 2017-03-28 DIAGNOSIS — R102 Pelvic and perineal pain: Secondary | ICD-10-CM | POA: Diagnosis not present

## 2017-03-28 DIAGNOSIS — I4891 Unspecified atrial fibrillation: Secondary | ICD-10-CM | POA: Diagnosis not present

## 2017-03-28 DIAGNOSIS — M549 Dorsalgia, unspecified: Secondary | ICD-10-CM | POA: Diagnosis not present

## 2017-03-28 DIAGNOSIS — R600 Localized edema: Secondary | ICD-10-CM | POA: Diagnosis not present

## 2017-03-28 DIAGNOSIS — M542 Cervicalgia: Secondary | ICD-10-CM | POA: Diagnosis not present

## 2017-03-29 DIAGNOSIS — R2681 Unsteadiness on feet: Secondary | ICD-10-CM | POA: Diagnosis not present

## 2017-03-29 DIAGNOSIS — M25511 Pain in right shoulder: Secondary | ICD-10-CM | POA: Diagnosis not present

## 2017-03-29 DIAGNOSIS — M6281 Muscle weakness (generalized): Secondary | ICD-10-CM | POA: Diagnosis not present

## 2017-03-29 DIAGNOSIS — R102 Pelvic and perineal pain: Secondary | ICD-10-CM | POA: Diagnosis not present

## 2017-03-29 DIAGNOSIS — M549 Dorsalgia, unspecified: Secondary | ICD-10-CM | POA: Diagnosis not present

## 2017-03-29 DIAGNOSIS — M542 Cervicalgia: Secondary | ICD-10-CM | POA: Diagnosis not present

## 2017-03-29 DIAGNOSIS — I4891 Unspecified atrial fibrillation: Secondary | ICD-10-CM | POA: Diagnosis not present

## 2017-03-30 DIAGNOSIS — M25559 Pain in unspecified hip: Secondary | ICD-10-CM | POA: Diagnosis not present

## 2017-03-30 DIAGNOSIS — M25511 Pain in right shoulder: Secondary | ICD-10-CM | POA: Diagnosis not present

## 2017-03-30 DIAGNOSIS — R102 Pelvic and perineal pain: Secondary | ICD-10-CM | POA: Diagnosis not present

## 2017-03-30 DIAGNOSIS — R2681 Unsteadiness on feet: Secondary | ICD-10-CM | POA: Diagnosis not present

## 2017-03-30 DIAGNOSIS — I4891 Unspecified atrial fibrillation: Secondary | ICD-10-CM | POA: Diagnosis not present

## 2017-03-30 DIAGNOSIS — M542 Cervicalgia: Secondary | ICD-10-CM | POA: Diagnosis not present

## 2017-03-30 DIAGNOSIS — M549 Dorsalgia, unspecified: Secondary | ICD-10-CM | POA: Diagnosis not present

## 2017-03-30 DIAGNOSIS — S32599A Other specified fracture of unspecified pubis, initial encounter for closed fracture: Secondary | ICD-10-CM | POA: Diagnosis not present

## 2017-03-30 DIAGNOSIS — M6281 Muscle weakness (generalized): Secondary | ICD-10-CM | POA: Diagnosis not present

## 2017-04-01 DIAGNOSIS — R0609 Other forms of dyspnea: Secondary | ICD-10-CM | POA: Diagnosis not present

## 2017-04-01 DIAGNOSIS — R21 Rash and other nonspecific skin eruption: Secondary | ICD-10-CM | POA: Diagnosis not present

## 2017-04-04 DIAGNOSIS — F4321 Adjustment disorder with depressed mood: Secondary | ICD-10-CM | POA: Diagnosis not present

## 2017-04-04 DIAGNOSIS — M549 Dorsalgia, unspecified: Secondary | ICD-10-CM | POA: Diagnosis not present

## 2017-04-04 DIAGNOSIS — I482 Chronic atrial fibrillation: Secondary | ICD-10-CM | POA: Diagnosis not present

## 2017-04-04 DIAGNOSIS — R102 Pelvic and perineal pain: Secondary | ICD-10-CM | POA: Diagnosis not present

## 2017-04-04 DIAGNOSIS — I4891 Unspecified atrial fibrillation: Secondary | ICD-10-CM | POA: Diagnosis not present

## 2017-04-04 DIAGNOSIS — F5102 Adjustment insomnia: Secondary | ICD-10-CM | POA: Diagnosis not present

## 2017-04-04 DIAGNOSIS — R2681 Unsteadiness on feet: Secondary | ICD-10-CM | POA: Diagnosis not present

## 2017-04-04 DIAGNOSIS — R600 Localized edema: Secondary | ICD-10-CM | POA: Diagnosis not present

## 2017-04-04 DIAGNOSIS — M6281 Muscle weakness (generalized): Secondary | ICD-10-CM | POA: Diagnosis not present

## 2017-04-04 DIAGNOSIS — M25511 Pain in right shoulder: Secondary | ICD-10-CM | POA: Diagnosis not present

## 2017-04-04 DIAGNOSIS — M542 Cervicalgia: Secondary | ICD-10-CM | POA: Diagnosis not present

## 2017-04-05 DIAGNOSIS — Z9114 Patient's other noncompliance with medication regimen: Secondary | ICD-10-CM | POA: Diagnosis not present

## 2017-04-05 DIAGNOSIS — I482 Chronic atrial fibrillation: Secondary | ICD-10-CM | POA: Diagnosis not present

## 2017-04-07 DIAGNOSIS — R0609 Other forms of dyspnea: Secondary | ICD-10-CM | POA: Diagnosis not present

## 2017-04-07 DIAGNOSIS — R42 Dizziness and giddiness: Secondary | ICD-10-CM | POA: Diagnosis not present

## 2017-04-07 DIAGNOSIS — R600 Localized edema: Secondary | ICD-10-CM | POA: Diagnosis not present

## 2017-04-07 DIAGNOSIS — I482 Chronic atrial fibrillation: Secondary | ICD-10-CM | POA: Diagnosis not present

## 2017-04-08 DIAGNOSIS — M542 Cervicalgia: Secondary | ICD-10-CM | POA: Diagnosis not present

## 2017-04-08 DIAGNOSIS — R600 Localized edema: Secondary | ICD-10-CM | POA: Diagnosis not present

## 2017-04-08 DIAGNOSIS — R102 Pelvic and perineal pain: Secondary | ICD-10-CM | POA: Diagnosis not present

## 2017-04-08 DIAGNOSIS — R195 Other fecal abnormalities: Secondary | ICD-10-CM | POA: Diagnosis not present

## 2017-04-08 DIAGNOSIS — M25511 Pain in right shoulder: Secondary | ICD-10-CM | POA: Diagnosis not present

## 2017-04-08 DIAGNOSIS — R2681 Unsteadiness on feet: Secondary | ICD-10-CM | POA: Diagnosis not present

## 2017-04-08 DIAGNOSIS — R42 Dizziness and giddiness: Secondary | ICD-10-CM | POA: Diagnosis not present

## 2017-04-08 DIAGNOSIS — I4891 Unspecified atrial fibrillation: Secondary | ICD-10-CM | POA: Diagnosis not present

## 2017-04-08 DIAGNOSIS — M549 Dorsalgia, unspecified: Secondary | ICD-10-CM | POA: Diagnosis not present

## 2017-04-08 DIAGNOSIS — I482 Chronic atrial fibrillation: Secondary | ICD-10-CM | POA: Diagnosis not present

## 2017-04-08 DIAGNOSIS — M6281 Muscle weakness (generalized): Secondary | ICD-10-CM | POA: Diagnosis not present

## 2017-04-11 DIAGNOSIS — I10 Essential (primary) hypertension: Secondary | ICD-10-CM | POA: Insufficient documentation

## 2017-04-11 DIAGNOSIS — E119 Type 2 diabetes mellitus without complications: Secondary | ICD-10-CM | POA: Insufficient documentation

## 2017-04-11 DIAGNOSIS — E1165 Type 2 diabetes mellitus with hyperglycemia: Secondary | ICD-10-CM | POA: Insufficient documentation

## 2017-04-11 DIAGNOSIS — M6281 Muscle weakness (generalized): Secondary | ICD-10-CM | POA: Diagnosis not present

## 2017-04-11 DIAGNOSIS — E785 Hyperlipidemia, unspecified: Secondary | ICD-10-CM | POA: Insufficient documentation

## 2017-04-11 DIAGNOSIS — I4891 Unspecified atrial fibrillation: Secondary | ICD-10-CM | POA: Diagnosis not present

## 2017-04-11 DIAGNOSIS — C801 Malignant (primary) neoplasm, unspecified: Secondary | ICD-10-CM | POA: Insufficient documentation

## 2017-04-11 DIAGNOSIS — M25511 Pain in right shoulder: Secondary | ICD-10-CM | POA: Diagnosis not present

## 2017-04-11 DIAGNOSIS — M1990 Unspecified osteoarthritis, unspecified site: Secondary | ICD-10-CM | POA: Insufficient documentation

## 2017-04-11 DIAGNOSIS — F5104 Psychophysiologic insomnia: Secondary | ICD-10-CM | POA: Insufficient documentation

## 2017-04-11 DIAGNOSIS — R102 Pelvic and perineal pain: Secondary | ICD-10-CM | POA: Diagnosis not present

## 2017-04-11 DIAGNOSIS — R42 Dizziness and giddiness: Secondary | ICD-10-CM | POA: Diagnosis not present

## 2017-04-11 DIAGNOSIS — M542 Cervicalgia: Secondary | ICD-10-CM | POA: Diagnosis not present

## 2017-04-11 DIAGNOSIS — Z7901 Long term (current) use of anticoagulants: Secondary | ICD-10-CM | POA: Insufficient documentation

## 2017-04-11 DIAGNOSIS — R2681 Unsteadiness on feet: Secondary | ICD-10-CM | POA: Diagnosis not present

## 2017-04-11 DIAGNOSIS — R600 Localized edema: Secondary | ICD-10-CM | POA: Diagnosis not present

## 2017-04-11 DIAGNOSIS — M549 Dorsalgia, unspecified: Secondary | ICD-10-CM | POA: Diagnosis not present

## 2017-04-11 DIAGNOSIS — I482 Chronic atrial fibrillation: Secondary | ICD-10-CM | POA: Diagnosis not present

## 2017-04-11 DIAGNOSIS — R0609 Other forms of dyspnea: Secondary | ICD-10-CM | POA: Diagnosis not present

## 2017-04-11 DIAGNOSIS — K5792 Diverticulitis of intestine, part unspecified, without perforation or abscess without bleeding: Secondary | ICD-10-CM | POA: Insufficient documentation

## 2017-04-11 DIAGNOSIS — I4819 Other persistent atrial fibrillation: Secondary | ICD-10-CM | POA: Insufficient documentation

## 2017-04-11 DIAGNOSIS — K5790 Diverticulosis of intestine, part unspecified, without perforation or abscess without bleeding: Secondary | ICD-10-CM | POA: Insufficient documentation

## 2017-04-12 DIAGNOSIS — S32810D Multiple fractures of pelvis with stable disruption of pelvic ring, subsequent encounter for fracture with routine healing: Secondary | ICD-10-CM | POA: Diagnosis not present

## 2017-04-13 DIAGNOSIS — R0609 Other forms of dyspnea: Secondary | ICD-10-CM | POA: Diagnosis not present

## 2017-04-13 DIAGNOSIS — R2681 Unsteadiness on feet: Secondary | ICD-10-CM | POA: Diagnosis not present

## 2017-04-13 DIAGNOSIS — M6281 Muscle weakness (generalized): Secondary | ICD-10-CM | POA: Diagnosis not present

## 2017-04-13 DIAGNOSIS — R7989 Other specified abnormal findings of blood chemistry: Secondary | ICD-10-CM | POA: Diagnosis not present

## 2017-04-13 DIAGNOSIS — M549 Dorsalgia, unspecified: Secondary | ICD-10-CM | POA: Diagnosis not present

## 2017-04-13 DIAGNOSIS — M542 Cervicalgia: Secondary | ICD-10-CM | POA: Diagnosis not present

## 2017-04-13 DIAGNOSIS — R102 Pelvic and perineal pain: Secondary | ICD-10-CM | POA: Diagnosis not present

## 2017-04-13 DIAGNOSIS — I4891 Unspecified atrial fibrillation: Secondary | ICD-10-CM | POA: Diagnosis not present

## 2017-04-13 DIAGNOSIS — M25511 Pain in right shoulder: Secondary | ICD-10-CM | POA: Diagnosis not present

## 2017-04-13 DIAGNOSIS — R600 Localized edema: Secondary | ICD-10-CM | POA: Diagnosis not present

## 2017-04-13 DIAGNOSIS — I482 Chronic atrial fibrillation: Secondary | ICD-10-CM | POA: Diagnosis not present

## 2017-04-15 DIAGNOSIS — R2681 Unsteadiness on feet: Secondary | ICD-10-CM | POA: Diagnosis not present

## 2017-04-15 DIAGNOSIS — R102 Pelvic and perineal pain: Secondary | ICD-10-CM | POA: Diagnosis not present

## 2017-04-15 DIAGNOSIS — M6281 Muscle weakness (generalized): Secondary | ICD-10-CM | POA: Diagnosis not present

## 2017-04-15 DIAGNOSIS — M542 Cervicalgia: Secondary | ICD-10-CM | POA: Diagnosis not present

## 2017-04-15 DIAGNOSIS — M549 Dorsalgia, unspecified: Secondary | ICD-10-CM | POA: Diagnosis not present

## 2017-04-15 DIAGNOSIS — I4891 Unspecified atrial fibrillation: Secondary | ICD-10-CM | POA: Diagnosis not present

## 2017-04-15 DIAGNOSIS — M25511 Pain in right shoulder: Secondary | ICD-10-CM | POA: Diagnosis not present

## 2017-04-18 DIAGNOSIS — R2681 Unsteadiness on feet: Secondary | ICD-10-CM | POA: Diagnosis not present

## 2017-04-18 DIAGNOSIS — F419 Anxiety disorder, unspecified: Secondary | ICD-10-CM | POA: Diagnosis not present

## 2017-04-18 DIAGNOSIS — I4891 Unspecified atrial fibrillation: Secondary | ICD-10-CM | POA: Diagnosis not present

## 2017-04-18 DIAGNOSIS — M542 Cervicalgia: Secondary | ICD-10-CM | POA: Diagnosis not present

## 2017-04-18 DIAGNOSIS — M6281 Muscle weakness (generalized): Secondary | ICD-10-CM | POA: Diagnosis not present

## 2017-04-18 DIAGNOSIS — M549 Dorsalgia, unspecified: Secondary | ICD-10-CM | POA: Diagnosis not present

## 2017-04-18 DIAGNOSIS — R102 Pelvic and perineal pain: Secondary | ICD-10-CM | POA: Diagnosis not present

## 2017-04-18 DIAGNOSIS — M25511 Pain in right shoulder: Secondary | ICD-10-CM | POA: Diagnosis not present

## 2017-04-20 DIAGNOSIS — E119 Type 2 diabetes mellitus without complications: Secondary | ICD-10-CM | POA: Diagnosis not present

## 2017-04-20 DIAGNOSIS — N390 Urinary tract infection, site not specified: Secondary | ICD-10-CM | POA: Diagnosis not present
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
## 2017-04-22 ENCOUNTER — Ambulatory Visit (INDEPENDENT_AMBULATORY_CARE_PROVIDER_SITE_OTHER): Payer: Medicare Other | Admitting: Cardiology

## 2017-04-22 ENCOUNTER — Encounter: Payer: Self-pay | Admitting: Cardiology

## 2017-04-22 VITALS — BP 117/77 | HR 84 | Ht 64.0 in | Wt 158.8 lb

## 2017-04-22 DIAGNOSIS — I4819 Other persistent atrial fibrillation: Secondary | ICD-10-CM

## 2017-04-22 DIAGNOSIS — M7989 Other specified soft tissue disorders: Secondary | ICD-10-CM | POA: Diagnosis not present

## 2017-04-22 DIAGNOSIS — I700 Atherosclerosis of aorta: Secondary | ICD-10-CM | POA: Diagnosis not present

## 2017-04-22 DIAGNOSIS — F419 Anxiety disorder, unspecified: Secondary | ICD-10-CM | POA: Diagnosis not present

## 2017-04-22 DIAGNOSIS — I10 Essential (primary) hypertension: Secondary | ICD-10-CM | POA: Diagnosis not present

## 2017-04-22 DIAGNOSIS — I719 Aortic aneurysm of unspecified site, without rupture: Secondary | ICD-10-CM | POA: Insufficient documentation

## 2017-04-22 DIAGNOSIS — I481 Persistent atrial fibrillation: Secondary | ICD-10-CM

## 2017-04-22 DIAGNOSIS — S22060D Wedge compression fracture of T7-T8 vertebra, subsequent encounter for fracture with routine healing: Secondary | ICD-10-CM | POA: Diagnosis not present

## 2017-04-22 MED ORDER — METOPROLOL TARTRATE 50 MG PO TABS: 50.0000 mg | ORAL_TABLET | Freq: Two times a day (BID) | ORAL | 3 refills | Status: DC

## 2017-04-22 MED ORDER — DIGOXIN 125 MCG PO TABS: 0.1250 mg | ORAL_TABLET | Freq: Every day | ORAL | 3 refills | Status: DC

## 2017-04-22 MED ORDER — FUROSEMIDE 40 MG PO TABS: 40.0000 mg | ORAL_TABLET | Freq: Every day | ORAL | 3 refills | Status: DC

## 2017-04-22 NOTE — Patient Instructions (Signed)
Medication Instructions:  START- Digoxin 0.125 mg daily DECREASE- Metoprolol 50 mg twice a day INCREASE- Lasix 40 mg daily  If you need a refill on your cardiac medications before your next appointment, please call your pharmacy.  Labwork: BNP and BMP in 10 days    Testing/Procedures: None Ordered  Follow-Up: Your physician wants you to follow-up in: 1 Month with Rhonda Barrett.    Thank you for choosing [facility] at [facility]!!

## 2017-04-25 DIAGNOSIS — I4891 Unspecified atrial fibrillation: Secondary | ICD-10-CM | POA: Diagnosis not present

## 2017-04-25 DIAGNOSIS — F419 Anxiety disorder, unspecified: Secondary | ICD-10-CM | POA: Diagnosis not present

## 2017-04-25 DIAGNOSIS — M542 Cervicalgia: Secondary | ICD-10-CM | POA: Diagnosis not present

## 2017-04-25 DIAGNOSIS — M6281 Muscle weakness (generalized): Secondary | ICD-10-CM | POA: Diagnosis not present

## 2017-04-25 DIAGNOSIS — R102 Pelvic and perineal pain: Secondary | ICD-10-CM | POA: Diagnosis not present

## 2017-04-25 DIAGNOSIS — M25511 Pain in right shoulder: Secondary | ICD-10-CM | POA: Diagnosis not present

## 2017-04-25 DIAGNOSIS — I482 Chronic atrial fibrillation: Secondary | ICD-10-CM | POA: Diagnosis not present

## 2017-04-25 DIAGNOSIS — M549 Dorsalgia, unspecified: Secondary | ICD-10-CM | POA: Diagnosis not present

## 2017-04-25 DIAGNOSIS — R2681 Unsteadiness on feet: Secondary | ICD-10-CM | POA: Diagnosis not present

## 2017-04-27 DIAGNOSIS — N390 Urinary tract infection, site not specified: Secondary | ICD-10-CM | POA: Diagnosis not present

## 2017-04-27 DIAGNOSIS — F419 Anxiety disorder, unspecified: Secondary | ICD-10-CM | POA: Diagnosis not present

## 2017-04-29 DIAGNOSIS — I4891 Unspecified atrial fibrillation: Secondary | ICD-10-CM | POA: Diagnosis not present

## 2017-04-29 DIAGNOSIS — M25511 Pain in right shoulder: Secondary | ICD-10-CM | POA: Diagnosis not present

## 2017-04-29 DIAGNOSIS — M549 Dorsalgia, unspecified: Secondary | ICD-10-CM | POA: Diagnosis not present

## 2017-04-29 DIAGNOSIS — M542 Cervicalgia: Secondary | ICD-10-CM | POA: Diagnosis not present

## 2017-04-29 DIAGNOSIS — M6281 Muscle weakness (generalized): Secondary | ICD-10-CM | POA: Diagnosis not present

## 2017-04-29 DIAGNOSIS — R102 Pelvic and perineal pain: Secondary | ICD-10-CM | POA: Diagnosis not present

## 2017-04-29 DIAGNOSIS — R2681 Unsteadiness on feet: Secondary | ICD-10-CM | POA: Diagnosis not present

## 2017-05-03 DIAGNOSIS — R42 Dizziness and giddiness: Secondary | ICD-10-CM | POA: Diagnosis not present

## 2017-05-03 DIAGNOSIS — I482 Chronic atrial fibrillation: Secondary | ICD-10-CM | POA: Diagnosis not present

## 2017-05-03 DIAGNOSIS — F419 Anxiety disorder, unspecified: Secondary | ICD-10-CM | POA: Diagnosis not present

## 2017-05-03 DIAGNOSIS — F5102 Adjustment insomnia: Secondary | ICD-10-CM | POA: Diagnosis not present

## 2017-05-04 DIAGNOSIS — E876 Hypokalemia: Secondary | ICD-10-CM | POA: Diagnosis not present

## 2017-05-04 DIAGNOSIS — F419 Anxiety disorder, unspecified: Secondary | ICD-10-CM | POA: Diagnosis not present

## 2017-05-06 DIAGNOSIS — E119 Type 2 diabetes mellitus without complications: Secondary | ICD-10-CM | POA: Diagnosis not present

## 2017-05-06 DIAGNOSIS — I482 Chronic atrial fibrillation: Secondary | ICD-10-CM | POA: Diagnosis not present

## 2017-05-06 DIAGNOSIS — Z789 Other specified health status: Secondary | ICD-10-CM | POA: Diagnosis not present

## 2017-05-09 DIAGNOSIS — S300XXD Contusion of lower back and pelvis, subsequent encounter: Secondary | ICD-10-CM | POA: Diagnosis not present

## 2017-05-09 DIAGNOSIS — S32592D Other specified fracture of left pubis, subsequent encounter for fracture with routine healing: Secondary | ICD-10-CM | POA: Diagnosis not present

## 2017-05-09 DIAGNOSIS — S32591D Other specified fracture of right pubis, subsequent encounter for fracture with routine healing: Secondary | ICD-10-CM | POA: Diagnosis not present

## 2017-05-09 DIAGNOSIS — S32511D Fracture of superior rim of right pubis, subsequent encounter for fracture with routine healing: Secondary | ICD-10-CM | POA: Diagnosis not present

## 2017-05-09 DIAGNOSIS — S32401D Unspecified fracture of right acetabulum, subsequent encounter for fracture with routine healing: Secondary | ICD-10-CM | POA: Diagnosis not present

## 2017-05-09 DIAGNOSIS — S32810D Multiple fractures of pelvis with stable disruption of pelvic ring, subsequent encounter for fracture with routine healing: Secondary | ICD-10-CM | POA: Diagnosis not present

## 2017-05-09 DIAGNOSIS — S32512D Fracture of superior rim of left pubis, subsequent encounter for fracture with routine healing: Secondary | ICD-10-CM | POA: Diagnosis not present

## 2017-05-09 DIAGNOSIS — S42121D Displaced fracture of acromial process, right shoulder, subsequent encounter for fracture with routine healing: Secondary | ICD-10-CM | POA: Diagnosis not present

## 2017-05-13 DIAGNOSIS — I712 Thoracic aortic aneurysm, without rupture: Secondary | ICD-10-CM | POA: Diagnosis not present

## 2017-05-13 DIAGNOSIS — J9610 Chronic respiratory failure, unspecified whether with hypoxia or hypercapnia: Secondary | ICD-10-CM | POA: Diagnosis not present

## 2017-05-13 DIAGNOSIS — T07XXXD Unspecified multiple injuries, subsequent encounter: Secondary | ICD-10-CM | POA: Diagnosis not present

## 2017-05-18 DIAGNOSIS — S32511D Fracture of superior rim of right pubis, subsequent encounter for fracture with routine healing: Secondary | ICD-10-CM | POA: Diagnosis not present

## 2017-05-18 DIAGNOSIS — S32591D Other specified fracture of right pubis, subsequent encounter for fracture with routine healing: Secondary | ICD-10-CM | POA: Diagnosis not present

## 2017-05-18 DIAGNOSIS — S32401D Unspecified fracture of right acetabulum, subsequent encounter for fracture with routine healing: Secondary | ICD-10-CM | POA: Diagnosis not present

## 2017-05-18 DIAGNOSIS — S300XXD Contusion of lower back and pelvis, subsequent encounter: Secondary | ICD-10-CM | POA: Diagnosis not present

## 2017-05-18 DIAGNOSIS — S32592D Other specified fracture of left pubis, subsequent encounter for fracture with routine healing: Secondary | ICD-10-CM | POA: Diagnosis not present

## 2017-05-18 DIAGNOSIS — S32512D Fracture of superior rim of left pubis, subsequent encounter for fracture with routine healing: Secondary | ICD-10-CM | POA: Diagnosis not present

## 2017-05-19 ENCOUNTER — Other Ambulatory Visit: Payer: Self-pay | Admitting: Cardiology

## 2017-05-19 DIAGNOSIS — S32511D Fracture of superior rim of right pubis, subsequent encounter for fracture with routine healing: Secondary | ICD-10-CM | POA: Diagnosis not present

## 2017-05-19 DIAGNOSIS — S32401D Unspecified fracture of right acetabulum, subsequent encounter for fracture with routine healing: Secondary | ICD-10-CM | POA: Diagnosis not present

## 2017-05-19 DIAGNOSIS — S32591D Other specified fracture of right pubis, subsequent encounter for fracture with routine healing: Secondary | ICD-10-CM | POA: Diagnosis not present

## 2017-05-19 DIAGNOSIS — S32592D Other specified fracture of left pubis, subsequent encounter for fracture with routine healing: Secondary | ICD-10-CM | POA: Diagnosis not present

## 2017-05-19 DIAGNOSIS — S300XXD Contusion of lower back and pelvis, subsequent encounter: Secondary | ICD-10-CM | POA: Diagnosis not present

## 2017-05-19 DIAGNOSIS — S32512D Fracture of superior rim of left pubis, subsequent encounter for fracture with routine healing: Secondary | ICD-10-CM | POA: Diagnosis not present

## 2017-05-26 ENCOUNTER — Encounter: Payer: Self-pay | Admitting: Physician Assistant

## 2017-05-26 ENCOUNTER — Ambulatory Visit (INDEPENDENT_AMBULATORY_CARE_PROVIDER_SITE_OTHER): Payer: Medicare Other | Admitting: Physician Assistant

## 2017-05-26 VITALS — BP 122/80 | HR 108 | Ht 64.0 in | Wt 131.6 lb

## 2017-05-26 DIAGNOSIS — Z7901 Long term (current) use of anticoagulants: Secondary | ICD-10-CM

## 2017-05-26 DIAGNOSIS — I482 Chronic atrial fibrillation, unspecified: Secondary | ICD-10-CM

## 2017-05-26 DIAGNOSIS — I700 Atherosclerosis of aorta: Secondary | ICD-10-CM | POA: Diagnosis not present

## 2017-05-26 DIAGNOSIS — R0902 Hypoxemia: Secondary | ICD-10-CM | POA: Diagnosis not present

## 2017-05-26 DIAGNOSIS — I719 Aortic aneurysm of unspecified site, without rupture: Secondary | ICD-10-CM

## 2017-05-26 DIAGNOSIS — I10 Essential (primary) hypertension: Secondary | ICD-10-CM | POA: Diagnosis not present

## 2017-05-26 NOTE — Patient Instructions (Signed)
Medication Instructions: Your physician recommends that you continue on your current medications as directed. Please refer to the Current Medication list given to you today.  If you need a refill on your cardiac medications before your next appointment, please call your pharmacy.   Follow-Up: Your physician wants you to follow-up in: 2-3 months with Dr. Percival Spanish. You will receive a reminder letter in the mail two months in advance. If you don't receive a letter, please call our office at [phone] to schedule this follow-up appointment.   Thank you for choosing Heartcare at [facility]!!
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
## 2017-05-27 DIAGNOSIS — S32591D Other specified fracture of right pubis, subsequent encounter for fracture with routine healing: Secondary | ICD-10-CM | POA: Diagnosis not present

## 2017-05-27 DIAGNOSIS — S32401D Unspecified fracture of right acetabulum, subsequent encounter for fracture with routine healing: Secondary | ICD-10-CM | POA: Diagnosis not present

## 2017-05-27 DIAGNOSIS — S32512D Fracture of superior rim of left pubis, subsequent encounter for fracture with routine healing: Secondary | ICD-10-CM | POA: Diagnosis not present

## 2017-05-27 DIAGNOSIS — S32511D Fracture of superior rim of right pubis, subsequent encounter for fracture with routine healing: Secondary | ICD-10-CM | POA: Diagnosis not present

## 2017-05-27 DIAGNOSIS — S300XXD Contusion of lower back and pelvis, subsequent encounter: Secondary | ICD-10-CM | POA: Diagnosis not present

## 2017-05-27 DIAGNOSIS — S32592D Other specified fracture of left pubis, subsequent encounter for fracture with routine healing: Secondary | ICD-10-CM | POA: Diagnosis not present

## 2017-05-30 DIAGNOSIS — I10 Essential (primary) hypertension: Secondary | ICD-10-CM | POA: Diagnosis not present

## 2017-05-30 DIAGNOSIS — S22060D Wedge compression fracture of T7-T8 vertebra, subsequent encounter for fracture with routine healing: Secondary | ICD-10-CM | POA: Diagnosis not present

## 2017-05-31 DIAGNOSIS — S32591D Other specified fracture of right pubis, subsequent encounter for fracture with routine healing: Secondary | ICD-10-CM | POA: Diagnosis not present

## 2017-05-31 DIAGNOSIS — S32512D Fracture of superior rim of left pubis, subsequent encounter for fracture with routine healing: Secondary | ICD-10-CM | POA: Diagnosis not present

## 2017-05-31 DIAGNOSIS — S300XXD Contusion of lower back and pelvis, subsequent encounter: Secondary | ICD-10-CM | POA: Diagnosis not present

## 2017-05-31 DIAGNOSIS — S32401D Unspecified fracture of right acetabulum, subsequent encounter for fracture with routine healing: Secondary | ICD-10-CM | POA: Diagnosis not present

## 2017-05-31 DIAGNOSIS — S32511D Fracture of superior rim of right pubis, subsequent encounter for fracture with routine healing: Secondary | ICD-10-CM | POA: Diagnosis not present

## 2017-05-31 DIAGNOSIS — S32592D Other specified fracture of left pubis, subsequent encounter for fracture with routine healing: Secondary | ICD-10-CM | POA: Diagnosis not present

## 2017-06-02 DIAGNOSIS — S32512D Fracture of superior rim of left pubis, subsequent encounter for fracture with routine healing: Secondary | ICD-10-CM | POA: Diagnosis not present

## 2017-06-02 DIAGNOSIS — S32592D Other specified fracture of left pubis, subsequent encounter for fracture with routine healing: Secondary | ICD-10-CM | POA: Diagnosis not present

## 2017-06-02 DIAGNOSIS — S32511D Fracture of superior rim of right pubis, subsequent encounter for fracture with routine healing: Secondary | ICD-10-CM | POA: Diagnosis not present

## 2017-06-02 DIAGNOSIS — S32401D Unspecified fracture of right acetabulum, subsequent encounter for fracture with routine healing: Secondary | ICD-10-CM | POA: Diagnosis not present

## 2017-06-02 DIAGNOSIS — S32591D Other specified fracture of right pubis, subsequent encounter for fracture with routine healing: Secondary | ICD-10-CM | POA: Diagnosis not present

## 2017-06-02 DIAGNOSIS — S300XXD Contusion of lower back and pelvis, subsequent encounter: Secondary | ICD-10-CM | POA: Diagnosis not present

## 2017-06-08 DIAGNOSIS — S300XXD Contusion of lower back and pelvis, subsequent encounter: Secondary | ICD-10-CM | POA: Diagnosis not present

## 2017-06-08 DIAGNOSIS — S32592D Other specified fracture of left pubis, subsequent encounter for fracture with routine healing: Secondary | ICD-10-CM | POA: Diagnosis not present

## 2017-06-08 DIAGNOSIS — S32511D Fracture of superior rim of right pubis, subsequent encounter for fracture with routine healing: Secondary | ICD-10-CM | POA: Diagnosis not present

## 2017-06-08 DIAGNOSIS — S32512D Fracture of superior rim of left pubis, subsequent encounter for fracture with routine healing: Secondary | ICD-10-CM | POA: Diagnosis not present

## 2017-06-08 DIAGNOSIS — S32591D Other specified fracture of right pubis, subsequent encounter for fracture with routine healing: Secondary | ICD-10-CM | POA: Diagnosis not present

## 2017-06-08 DIAGNOSIS — S32401D Unspecified fracture of right acetabulum, subsequent encounter for fracture with routine healing: Secondary | ICD-10-CM | POA: Diagnosis not present

## 2017-06-09 DIAGNOSIS — S32592D Other specified fracture of left pubis, subsequent encounter for fracture with routine healing: Secondary | ICD-10-CM | POA: Diagnosis not present

## 2017-06-09 DIAGNOSIS — S32591D Other specified fracture of right pubis, subsequent encounter for fracture with routine healing: Secondary | ICD-10-CM | POA: Diagnosis not present

## 2017-06-09 DIAGNOSIS — S32401D Unspecified fracture of right acetabulum, subsequent encounter for fracture with routine healing: Secondary | ICD-10-CM | POA: Diagnosis not present

## 2017-06-09 DIAGNOSIS — S32512D Fracture of superior rim of left pubis, subsequent encounter for fracture with routine healing: Secondary | ICD-10-CM | POA: Diagnosis not present

## 2017-06-09 DIAGNOSIS — S300XXD Contusion of lower back and pelvis, subsequent encounter: Secondary | ICD-10-CM | POA: Diagnosis not present

## 2017-06-09 DIAGNOSIS — S32511D Fracture of superior rim of right pubis, subsequent encounter for fracture with routine healing: Secondary | ICD-10-CM | POA: Diagnosis not present

## 2017-06-13 ENCOUNTER — Ambulatory Visit (INDEPENDENT_AMBULATORY_CARE_PROVIDER_SITE_OTHER): Payer: Medicare Other | Admitting: Podiatry

## 2017-06-13 ENCOUNTER — Encounter: Payer: Self-pay | Admitting: Podiatry

## 2017-06-13 DIAGNOSIS — B351 Tinea unguium: Secondary | ICD-10-CM

## 2017-06-13 DIAGNOSIS — E0843 Diabetes mellitus due to underlying condition with diabetic autonomic (poly)neuropathy: Secondary | ICD-10-CM

## 2017-06-13 DIAGNOSIS — M2041 Other hammer toe(s) (acquired), right foot: Secondary | ICD-10-CM

## 2017-06-13 DIAGNOSIS — M79676 Pain in unspecified toe(s): Secondary | ICD-10-CM

## 2017-06-13 DIAGNOSIS — M2042 Other hammer toe(s) (acquired), left foot: Secondary | ICD-10-CM

## 2017-06-14 DIAGNOSIS — S32511D Fracture of superior rim of right pubis, subsequent encounter for fracture with routine healing: Secondary | ICD-10-CM | POA: Diagnosis not present

## 2017-06-14 DIAGNOSIS — S32512D Fracture of superior rim of left pubis, subsequent encounter for fracture with routine healing: Secondary | ICD-10-CM | POA: Diagnosis not present

## 2017-06-14 DIAGNOSIS — S32401D Unspecified fracture of right acetabulum, subsequent encounter for fracture with routine healing: Secondary | ICD-10-CM | POA: Diagnosis not present

## 2017-06-14 DIAGNOSIS — S32591D Other specified fracture of right pubis, subsequent encounter for fracture with routine healing: Secondary | ICD-10-CM | POA: Diagnosis not present

## 2017-06-14 DIAGNOSIS — S32592D Other specified fracture of left pubis, subsequent encounter for fracture with routine healing: Secondary | ICD-10-CM | POA: Diagnosis not present

## 2017-06-14 DIAGNOSIS — S300XXD Contusion of lower back and pelvis, subsequent encounter: Secondary | ICD-10-CM | POA: Diagnosis not present

## 2017-06-15 NOTE — Progress Notes (Signed)
   HPI: 84 year old female presenting today for an elongated, thickened toenail of the second digit of the right foot that has been present for several weeks. She is unable to trim the toenail herself. She denies pain. Patient is here for further evaluation and treatment.   Past Medical History:  Diagnosis Date  . Aortic valve sclerosis    Echo, 2008  . Arthritis   . Bradycardia    October, 2012  . Cancer ([place])   . Chest pain    Nuclear, April, 2008, no ischemia,  . Chronic insomnia    situational stress  . Closed fracture of unspecified part of femur 2005  . Diabetes mellitus, type 2 ([place])   . Diverticulitis   . Diverticulosis   . Ejection fraction    EF 60%, echo, February, 2008  //   EF 65-70%, echo, November, 2012  . Hyperlipidemia   . Hypertension   . Long term (current) use of anticoagulants   . Osteoporosis    femur fracture 2005, pelvic fracture 2006  . Persistent atrial fibrillation ([place])   . Personal history of malignant neoplasm of breast   . Syncope   . Thoracic aortic aneurysm ([place]) 02/2017  . Unspecified closed fracture of pelvis 2006     Physical Exam: Objective: Physical Exam General: The patient is alert and oriented x3 in no acute distress.  Dermatology: Hyperkeratotic, discolored, thickened, onychodystrophy of nail of second digit of right foot. Skin is cool, dry and supple bilateral lower extremities. Negative for open lesions or macerations.  Vascular: Palpable pedal pulses bilaterally. No edema or erythema noted. Capillary refill within normal limits.  Neurological: Epicritic and protective threshold grossly intact bilaterally.   Musculoskeletal Exam: All pedal and ankle joints range of motion within normal limits bilateral. Muscle strength 5/5 in all groups bilateral. Hammertoe contracture deformity noted to digits 1-5 of the bilateral feet.   Assessment: - Dystrophic nail right second toe - DM with Polyneuropathy - Hammertoe deformity 1-5  bilateral   Plan of Care:  - Patient evaluated.  - Mechanical debridement of right second toenail performed using a nail nipper. Filed with dremel without incident.  - Appointment with Liliane Channel for DM shoes with insoles.  - Return to clinic in 3 months.   Edrick Kins, DPM [facility]  Dr. Edrick Kins, DPM    [address], [place] [address]                Office [phone]  Fax [phone]

## 2017-06-16 DIAGNOSIS — S32592D Other specified fracture of left pubis, subsequent encounter for fracture with routine healing: Secondary | ICD-10-CM | POA: Diagnosis not present

## 2017-06-16 DIAGNOSIS — S32511D Fracture of superior rim of right pubis, subsequent encounter for fracture with routine healing: Secondary | ICD-10-CM | POA: Diagnosis not present

## 2017-06-16 DIAGNOSIS — S32512D Fracture of superior rim of left pubis, subsequent encounter for fracture with routine healing: Secondary | ICD-10-CM | POA: Diagnosis not present

## 2017-06-16 DIAGNOSIS — S32401D Unspecified fracture of right acetabulum, subsequent encounter for fracture with routine healing: Secondary | ICD-10-CM | POA: Diagnosis not present

## 2017-06-16 DIAGNOSIS — S32591D Other specified fracture of right pubis, subsequent encounter for fracture with routine healing: Secondary | ICD-10-CM | POA: Diagnosis not present

## 2017-06-16 DIAGNOSIS — S300XXD Contusion of lower back and pelvis, subsequent encounter: Secondary | ICD-10-CM | POA: Diagnosis not present

## 2017-06-21 ENCOUNTER — Other Ambulatory Visit: Payer: Medicare Other | Admitting: Orthotics

## 2017-06-21 DIAGNOSIS — S32591D Other specified fracture of right pubis, subsequent encounter for fracture with routine healing: Secondary | ICD-10-CM | POA: Diagnosis not present

## 2017-06-21 DIAGNOSIS — S300XXD Contusion of lower back and pelvis, subsequent encounter: Secondary | ICD-10-CM | POA: Diagnosis not present

## 2017-06-21 DIAGNOSIS — S32401D Unspecified fracture of right acetabulum, subsequent encounter for fracture with routine healing: Secondary | ICD-10-CM | POA: Diagnosis not present

## 2017-06-21 DIAGNOSIS — S32511D Fracture of superior rim of right pubis, subsequent encounter for fracture with routine healing: Secondary | ICD-10-CM | POA: Diagnosis not present

## 2017-06-21 DIAGNOSIS — S32512D Fracture of superior rim of left pubis, subsequent encounter for fracture with routine healing: Secondary | ICD-10-CM | POA: Diagnosis not present

## 2017-06-21 DIAGNOSIS — S32592D Other specified fracture of left pubis, subsequent encounter for fracture with routine healing: Secondary | ICD-10-CM | POA: Diagnosis not present

## 2017-06-22 ENCOUNTER — Other Ambulatory Visit: Payer: Medicare Other | Admitting: Orthotics

## 2017-06-22 DIAGNOSIS — S32511D Fracture of superior rim of right pubis, subsequent encounter for fracture with routine healing: Secondary | ICD-10-CM | POA: Diagnosis not present

## 2017-06-22 DIAGNOSIS — S32591D Other specified fracture of right pubis, subsequent encounter for fracture with routine healing: Secondary | ICD-10-CM | POA: Diagnosis not present

## 2017-06-22 DIAGNOSIS — S32512D Fracture of superior rim of left pubis, subsequent encounter for fracture with routine healing: Secondary | ICD-10-CM | POA: Diagnosis not present

## 2017-06-22 DIAGNOSIS — S32592D Other specified fracture of left pubis, subsequent encounter for fracture with routine healing: Secondary | ICD-10-CM | POA: Diagnosis not present

## 2017-06-22 DIAGNOSIS — S32401D Unspecified fracture of right acetabulum, subsequent encounter for fracture with routine healing: Secondary | ICD-10-CM | POA: Diagnosis not present

## 2017-06-22 DIAGNOSIS — S300XXD Contusion of lower back and pelvis, subsequent encounter: Secondary | ICD-10-CM | POA: Diagnosis not present

## 2017-06-28 DIAGNOSIS — S32592D Other specified fracture of left pubis, subsequent encounter for fracture with routine healing: Secondary | ICD-10-CM | POA: Diagnosis not present

## 2017-06-28 DIAGNOSIS — S32401D Unspecified fracture of right acetabulum, subsequent encounter for fracture with routine healing: Secondary | ICD-10-CM | POA: Diagnosis not present

## 2017-06-28 DIAGNOSIS — S32512D Fracture of superior rim of left pubis, subsequent encounter for fracture with routine healing: Secondary | ICD-10-CM | POA: Diagnosis not present

## 2017-06-28 DIAGNOSIS — S32511D Fracture of superior rim of right pubis, subsequent encounter for fracture with routine healing: Secondary | ICD-10-CM | POA: Diagnosis not present

## 2017-06-28 DIAGNOSIS — S32591D Other specified fracture of right pubis, subsequent encounter for fracture with routine healing: Secondary | ICD-10-CM | POA: Diagnosis not present

## 2017-06-28 DIAGNOSIS — S300XXD Contusion of lower back and pelvis, subsequent encounter: Secondary | ICD-10-CM | POA: Diagnosis not present

## 2017-06-30 DIAGNOSIS — S300XXD Contusion of lower back and pelvis, subsequent encounter: Secondary | ICD-10-CM | POA: Diagnosis not present

## 2017-06-30 DIAGNOSIS — S32401D Unspecified fracture of right acetabulum, subsequent encounter for fracture with routine healing: Secondary | ICD-10-CM | POA: Diagnosis not present

## 2017-06-30 DIAGNOSIS — S32592D Other specified fracture of left pubis, subsequent encounter for fracture with routine healing: Secondary | ICD-10-CM | POA: Diagnosis not present

## 2017-06-30 DIAGNOSIS — S32511D Fracture of superior rim of right pubis, subsequent encounter for fracture with routine healing: Secondary | ICD-10-CM | POA: Diagnosis not present

## 2017-06-30 DIAGNOSIS — S32512D Fracture of superior rim of left pubis, subsequent encounter for fracture with routine healing: Secondary | ICD-10-CM | POA: Diagnosis not present

## 2017-06-30 DIAGNOSIS — S32591D Other specified fracture of right pubis, subsequent encounter for fracture with routine healing: Secondary | ICD-10-CM | POA: Diagnosis not present

## 2017-07-01 DIAGNOSIS — Z794 Long term (current) use of insulin: Secondary | ICD-10-CM | POA: Diagnosis not present

## 2017-07-01 DIAGNOSIS — M810 Age-related osteoporosis without current pathological fracture: Secondary | ICD-10-CM | POA: Diagnosis not present

## 2017-07-01 DIAGNOSIS — E119 Type 2 diabetes mellitus without complications: Secondary | ICD-10-CM | POA: Diagnosis not present

## 2017-07-01 DIAGNOSIS — Z8781 Personal history of (healed) traumatic fracture: Secondary | ICD-10-CM | POA: Diagnosis not present

## 2017-07-04 DIAGNOSIS — S22060D Wedge compression fracture of T7-T8 vertebra, subsequent encounter for fracture with routine healing: Secondary | ICD-10-CM | POA: Diagnosis not present

## 2017-07-05 DIAGNOSIS — S32591D Other specified fracture of right pubis, subsequent encounter for fracture with routine healing: Secondary | ICD-10-CM | POA: Diagnosis not present

## 2017-07-05 DIAGNOSIS — S32511D Fracture of superior rim of right pubis, subsequent encounter for fracture with routine healing: Secondary | ICD-10-CM | POA: Diagnosis not present

## 2017-07-05 DIAGNOSIS — S32401D Unspecified fracture of right acetabulum, subsequent encounter for fracture with routine healing: Secondary | ICD-10-CM | POA: Diagnosis not present

## 2017-07-05 DIAGNOSIS — S300XXD Contusion of lower back and pelvis, subsequent encounter: Secondary | ICD-10-CM | POA: Diagnosis not present

## 2017-07-05 DIAGNOSIS — S32512D Fracture of superior rim of left pubis, subsequent encounter for fracture with routine healing: Secondary | ICD-10-CM | POA: Diagnosis not present

## 2017-07-05 DIAGNOSIS — S32592D Other specified fracture of left pubis, subsequent encounter for fracture with routine healing: Secondary | ICD-10-CM | POA: Diagnosis not present

## 2017-07-20 ENCOUNTER — Ambulatory Visit (INDEPENDENT_AMBULATORY_CARE_PROVIDER_SITE_OTHER): Payer: Medicare Other | Admitting: Orthotics

## 2017-07-20 DIAGNOSIS — E0843 Diabetes mellitus due to underlying condition with diabetic autonomic (poly)neuropathy: Secondary | ICD-10-CM

## 2017-07-20 DIAGNOSIS — M2041 Other hammer toe(s) (acquired), right foot: Secondary | ICD-10-CM

## 2017-07-20 DIAGNOSIS — E1142 Type 2 diabetes mellitus with diabetic polyneuropathy: Secondary | ICD-10-CM

## 2017-07-20 DIAGNOSIS — M2042 Other hammer toe(s) (acquired), left foot: Secondary | ICD-10-CM | POA: Diagnosis not present

## 2017-07-20 NOTE — Progress Notes (Signed)

## 2017-07-25 ENCOUNTER — Encounter (HOSPITAL_COMMUNITY): Payer: Medicare Other

## 2017-07-25 DIAGNOSIS — J9611 Chronic respiratory failure with hypoxia: Secondary | ICD-10-CM | POA: Diagnosis not present

## 2017-07-25 DIAGNOSIS — I481 Persistent atrial fibrillation: Secondary | ICD-10-CM | POA: Diagnosis not present

## 2017-07-25 DIAGNOSIS — I712 Thoracic aortic aneurysm, without rupture: Secondary | ICD-10-CM | POA: Diagnosis not present

## 2017-07-28 ENCOUNTER — Encounter (HOSPITAL_COMMUNITY): Payer: Self-pay

## 2017-07-28 ENCOUNTER — Ambulatory Visit (HOSPITAL_COMMUNITY)
Admission: RE | Admit: 2017-07-28 | Discharge: 2017-07-28 | Disposition: A | Discharge status: Home / Self Care | Payer: Medicare Other | Source: Ambulatory Visit | Attending: Internal Medicine | Admitting: Internal Medicine

## 2017-07-28 DIAGNOSIS — M810 Age-related osteoporosis without current pathological fracture: Secondary | ICD-10-CM | POA: Insufficient documentation

## 2017-07-28 MED ORDER — ZOLEDRONIC ACID 5 MG/100ML IV SOLN
5.0000 mg | Freq: Once | INTRAVENOUS | Status: AC
  Administered 2017-07-28: 5 mg via INTRAVENOUS
  Filled 2017-07-28: qty 100

## 2017-07-28 MED ORDER — SODIUM CHLORIDE 0.9 % IV SOLN
Freq: Once | INTRAVENOUS | Status: AC
  Administered 2017-07-28: 11:00:00 via INTRAVENOUS

## 2017-07-28 NOTE — Discharge Instructions (Signed)
Reclast    Drink  Fluids/water as tolerated over the next 72 hours Tylenol or ibuprofen if needed for aches and pains Continue Calcium and Vit D as directed by your MD  Call 911 for emergencies Call MD for other problems or questions     Zoledronic Acid injection (Paget's Disease, Osteoporosis) What is this medicine? ZOLEDRONIC ACID (ZOE le dron ik AS id) lowers the amount of calcium loss from bone. It is used to treat Paget's disease and osteoporosis in women. This medicine may be used for other purposes; ask your health care provider or pharmacist if you have questions. COMMON BRAND NAME(S): Reclast, Zometa What should I tell my health care provider before I take this medicine? They need to know if you have any of these conditions: -aspirin-sensitive asthma -cancer, especially if you are receiving medicines used to treat cancer -dental disease or wear dentures -infection -kidney disease -low levels of calcium in the blood -past surgery on the parathyroid gland or intestines -receiving corticosteroids like dexamethasone or prednisone -an unusual or allergic reaction to zoledronic acid, other medicines, foods, dyes, or preservatives -pregnant or trying to get pregnant -breast-feeding How should I use this medicine? This medicine is for infusion into a vein. It is given by a health care professional in a hospital or clinic setting. Talk to your pediatrician regarding the use of this medicine in children. This medicine is not approved for use in children. Overdosage: If you think you have taken too much of this medicine contact a poison control center or emergency room at once. NOTE: This medicine is only for you. Do not share this medicine with others. What if I miss a dose? It is important not to miss your dose. Call your doctor or health care professional if you are unable to keep an appointment. What may interact with this medicine? -certain antibiotics given by  injection -NSAIDs, medicines for pain and inflammation, like ibuprofen or naproxen -some diuretics like bumetanide, furosemide -teriparatide This list may not describe all possible interactions. Give your health care provider a list of all the medicines, herbs, non-prescription drugs, or dietary supplements you use. Also tell them if you smoke, drink alcohol, or use illegal drugs. Some items may interact with your medicine. What should I watch for while using this medicine? Visit your doctor or health care professional for regular checkups. It may be some time before you see the benefit from this medicine. Do not stop taking your medicine unless your doctor tells you to. Your doctor may order blood tests or other tests to see how you are doing. Women should inform their doctor if they wish to become pregnant or think they might be pregnant. There is a potential for serious side effects to an unborn child. Talk to your health care professional or pharmacist for more information. You should make sure that you get enough calcium and vitamin D while you are taking this medicine. Discuss the foods you eat and the vitamins you take with your health care professional. Some people who take this medicine have severe bone, joint, and/or muscle pain. This medicine may also increase your risk for jaw problems or a broken thigh bone. Tell your doctor right away if you have severe pain in your jaw, bones, joints, or muscles. Tell your doctor if you have any pain that does not go away or that gets worse. Tell your dentist and dental surgeon that you are taking this medicine. You should not have major dental surgery while on  this medicine. See your dentist to have a dental exam and fix any dental problems before starting this medicine. Take good care of your teeth while on this medicine. Make sure you see your dentist for regular follow-up appointments. What side effects may I notice from receiving this medicine? Side  effects that you should report to your doctor or health care professional as soon as possible: -allergic reactions like skin rash, itching or hives, swelling of the face, lips, or tongue -anxiety, confusion, or depression -breathing problems -changes in vision -eye pain -feeling faint or lightheaded, falls -jaw pain, especially after dental work -mouth sores -muscle cramps, stiffness, or weakness -redness, blistering, peeling or loosening of the skin, including inside the mouth -trouble passing urine or change in the amount of urine Side effects that usually do not require medical attention (report to your doctor or health care professional if they continue or are bothersome): -bone, joint, or muscle pain -constipation -diarrhea -fever -hair loss -irritation at site where injected -loss of appetite -nausea, vomiting -stomach upset -trouble sleeping -trouble swallowing -weak or tired This list may not describe all possible side effects. Call your doctor for medical advice about side effects. You may report side effects to FDA at 1-800-FDA-1088. Where should I keep my medicine? This drug is given in a hospital or clinic and will not be stored at home. NOTE: This sheet is a summary. It may not cover all possible information. If you have questions about this medicine, talk to your doctor, pharmacist, or health care provider.  2018 Elsevier/Gold Standard (2013-10-06 14:19:57)

## 2017-07-28 NOTE — Progress Notes (Signed)
reclast infusion completed, pt given d/c instructions on reclast.  Pt has had before and did well with reclast.  Iv d/c and pressure dressing applied using gauze and coban.  Vss, afebrile.  Pt d/c with friend using her rolling walker.

## 2017-08-05 ENCOUNTER — Encounter: Payer: Self-pay | Admitting: Cardiology

## 2017-08-08 DIAGNOSIS — S32810D Multiple fractures of pelvis with stable disruption of pelvic ring, subsequent encounter for fracture with routine healing: Secondary | ICD-10-CM | POA: Diagnosis not present
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
## 2017-08-15 ENCOUNTER — Ambulatory Visit (INDEPENDENT_AMBULATORY_CARE_PROVIDER_SITE_OTHER): Payer: Medicare Other | Admitting: Cardiology

## 2017-08-15 ENCOUNTER — Encounter: Payer: Self-pay | Admitting: Cardiology

## 2017-08-15 ENCOUNTER — Telehealth: Payer: Self-pay | Admitting: Cardiology

## 2017-08-15 VITALS — BP 120/76 | HR 70 | Ht 64.0 in | Wt 128.8 lb

## 2017-08-15 DIAGNOSIS — I10 Essential (primary) hypertension: Secondary | ICD-10-CM | POA: Diagnosis not present

## 2017-08-15 DIAGNOSIS — I712 Thoracic aortic aneurysm, without rupture, unspecified: Secondary | ICD-10-CM | POA: Insufficient documentation

## 2017-08-15 DIAGNOSIS — Z79899 Other long term (current) drug therapy: Secondary | ICD-10-CM | POA: Diagnosis not present

## 2017-08-15 DIAGNOSIS — I482 Chronic atrial fibrillation, unspecified: Secondary | ICD-10-CM | POA: Insufficient documentation

## 2017-08-15 DIAGNOSIS — I719 Aortic aneurysm of unspecified site, without rupture: Secondary | ICD-10-CM

## 2017-08-15 DIAGNOSIS — I700 Atherosclerosis of aorta: Secondary | ICD-10-CM

## 2017-08-15 NOTE — Patient Instructions (Addendum)
Medication Instructions:  Continue current medications  If you need a refill on your cardiac medications before your next appointment, please call your pharmacy.  Labwork: None Ordered   Testing/Procedures: None Ordered   Follow-Up: Your physician wants you to follow-up in: Dr Bridgett Larsson.   Your physician recommends that you schedule a follow-up appointment in: 3 Months with [name]     Thank you for choosing [facility] at [facility]!!

## 2017-08-15 NOTE — Telephone Encounter (Signed)
New message    Patient calling to request discontinue order for oxygen be sent to [facility].

## 2017-08-15 NOTE — Telephone Encounter (Signed)
Returned the call to the patient. She stated that she needs a fax sent to [place] stating that the oxygen can be discontinued. Message routed to the provider's assistant.

## 2017-08-15 NOTE — Telephone Encounter (Signed)
Letter written and faxed to [facility]

## 2017-08-16 LAB — DIGOXIN LEVEL: Digoxin, Serum: 1.6 ng/mL — ABNORMAL HIGH (ref 0.5–0.9)

## 2017-08-19 ENCOUNTER — Telehealth: Payer: Self-pay | Admitting: Cardiology

## 2017-08-19 DIAGNOSIS — Z5181 Encounter for therapeutic drug level monitoring: Secondary | ICD-10-CM

## 2017-08-19 DIAGNOSIS — Z79899 Other long term (current) drug therapy: Secondary | ICD-10-CM

## 2017-08-19 NOTE — Telephone Encounter (Signed)
New message ° ° ° °Patient returning call for lab results . Please call °

## 2017-08-19 NOTE — Telephone Encounter (Signed)
Patient aware of results. She will decrease dose as directed by MD. She was advised to come in for lab work on 4/12 or 4/15 for lab work 5 hours after her digoxin dose which she takes in AM.   Routed to [place] as [employer]

## 2017-08-19 NOTE — Telephone Encounter (Signed)
Follow up ° °Pt returning call for nurse °

## 2017-09-02 DIAGNOSIS — Z79899 Other long term (current) drug therapy: Secondary | ICD-10-CM | POA: Diagnosis not present

## 2017-09-02 DIAGNOSIS — Z5181 Encounter for therapeutic drug level monitoring: Secondary | ICD-10-CM | POA: Diagnosis not present

## 2017-09-03 LAB — DIGOXIN LEVEL: Digoxin, Serum: 0.6 ng/mL (ref 0.5–0.9)

## 2017-09-07 DIAGNOSIS — H353131 Nonexudative age-related macular degeneration, bilateral, early dry stage: Secondary | ICD-10-CM | POA: Diagnosis not present

## 2017-09-07 DIAGNOSIS — H35372 Puckering of macula, left eye: Secondary | ICD-10-CM | POA: Diagnosis not present

## 2017-09-07 DIAGNOSIS — H43813 Vitreous degeneration, bilateral: Secondary | ICD-10-CM | POA: Diagnosis not present

## 2017-09-12 ENCOUNTER — Encounter: Payer: Self-pay | Admitting: Podiatry

## 2017-09-12 ENCOUNTER — Ambulatory Visit (INDEPENDENT_AMBULATORY_CARE_PROVIDER_SITE_OTHER): Payer: Medicare Other | Admitting: Podiatry

## 2017-09-12 DIAGNOSIS — B351 Tinea unguium: Secondary | ICD-10-CM | POA: Diagnosis not present

## 2017-09-12 DIAGNOSIS — M79676 Pain in unspecified toe(s): Secondary | ICD-10-CM | POA: Diagnosis not present

## 2017-09-12 DIAGNOSIS — M2041 Other hammer toe(s) (acquired), right foot: Secondary | ICD-10-CM

## 2017-09-12 DIAGNOSIS — E1142 Type 2 diabetes mellitus with diabetic polyneuropathy: Secondary | ICD-10-CM

## 2017-09-12 DIAGNOSIS — M2042 Other hammer toe(s) (acquired), left foot: Secondary | ICD-10-CM

## 2017-09-12 NOTE — Progress Notes (Signed)
This patient  presents to the office with long, thick, ingrowing toenail second toe right foot. She   says this toenail is painful walking and wearing her shoes.  She says she has been seen by Dr. Amalia Hailey who diagnosed her as having an ingrown toenail due to her hammertoe second toe right.  Patient states that she is unable to self treat.  She presently is taking Xarelto.  She presents the office today for treatment of the painful second toe right foot.  General Appearance  Alert, conversant and in no acute stress.  Vascular  Dorsalis pedis and posterior tibial  pulses are palpable  bilaterally.  Capillary return is within normal limits  bilaterally. Temperature is within normal limits  bilaterally.  Neurologic  Senn-Weinstein monofilament wire test within normal limits  bilaterally. Muscle power within normal limits bilaterally.  Nails Thick disfigured discolored nails first and second digits right foot.  Orthopedic  No limitations of motion of motion feet .  No crepitus or effusions noted. Hammer toe deformity second toe right foot.  Skin  normotropic skin with no porokeratosis noted bilaterally.  No signs of infections or ulcers noted.    Onychomycotic nail second toe right foot  Hammer toe second toe right foot.  Debridement of nails  1,2 right foot.  Padding dispensed to help cushion second toe right foot.   Gardiner Barefoot DPM

## 2017-09-15 DIAGNOSIS — W273XXA Contact with needle (sewing), initial encounter: Secondary | ICD-10-CM | POA: Diagnosis not present

## 2017-09-15 DIAGNOSIS — T148XXA Other injury of unspecified body region, initial encounter: Secondary | ICD-10-CM | POA: Diagnosis not present
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
## 2017-09-21 ENCOUNTER — Other Ambulatory Visit: Payer: Self-pay

## 2017-09-21 ENCOUNTER — Encounter: Payer: Self-pay | Admitting: Vascular Surgery

## 2017-09-21 ENCOUNTER — Ambulatory Visit (INDEPENDENT_AMBULATORY_CARE_PROVIDER_SITE_OTHER): Payer: Medicare Other | Admitting: Vascular Surgery

## 2017-09-21 VITALS — BP 122/74 | HR 58 | Temp 98.2°F | Resp 16 | Ht 64.0 in | Wt 132.0 lb

## 2017-09-21 DIAGNOSIS — I700 Atherosclerosis of aorta: Secondary | ICD-10-CM

## 2017-09-21 DIAGNOSIS — I719 Aortic aneurysm of unspecified site, without rupture: Secondary | ICD-10-CM

## 2017-10-24 DIAGNOSIS — D179 Benign lipomatous neoplasm, unspecified: Secondary | ICD-10-CM | POA: Diagnosis not present

## 2017-10-24 DIAGNOSIS — G4700 Insomnia, unspecified: Secondary | ICD-10-CM | POA: Diagnosis not present

## 2017-10-26 DIAGNOSIS — Z961 Presence of intraocular lens: Secondary | ICD-10-CM | POA: Diagnosis not present

## 2017-10-26 DIAGNOSIS — E119 Type 2 diabetes mellitus without complications: Secondary | ICD-10-CM | POA: Diagnosis not present

## 2017-10-26 DIAGNOSIS — H5213 Myopia, bilateral: Secondary | ICD-10-CM | POA: Diagnosis not present

## 2017-11-01 ENCOUNTER — Ambulatory Visit (INDEPENDENT_AMBULATORY_CARE_PROVIDER_SITE_OTHER): Payer: Medicare Other | Admitting: Physician Assistant

## 2017-11-01 ENCOUNTER — Encounter: Payer: Self-pay | Admitting: Physician Assistant

## 2017-11-01 VITALS — BP 115/68 | HR 96 | Ht 64.0 in | Wt 137.4 lb

## 2017-11-01 DIAGNOSIS — Z5181 Encounter for therapeutic drug level monitoring: Secondary | ICD-10-CM | POA: Diagnosis not present

## 2017-11-01 DIAGNOSIS — I10 Essential (primary) hypertension: Secondary | ICD-10-CM | POA: Diagnosis not present

## 2017-11-01 DIAGNOSIS — I5033 Acute on chronic diastolic (congestive) heart failure: Secondary | ICD-10-CM | POA: Diagnosis not present

## 2017-11-01 DIAGNOSIS — I482 Chronic atrial fibrillation, unspecified: Secondary | ICD-10-CM

## 2017-11-01 NOTE — Progress Notes (Signed)
Cardiology Office Note   Date:  11/01/2017   ID:  Tara Bradley, DOB Feb 19, 1923, MRN [id number]  PCP:  Tara Jordan, MD  Cardiologist: Dr. Percival Bradley, 08/15/2017 Tara Ferries, PA-C 05/26/2017  Chief Complaint  Patient presents with  . Follow-up    discuss dosage of lasix and metoprolol    History of Present Illness: Tara Bradley is a 84 y.o. female with a history of Ao sclerosis, perm afib on Xarelto, DM, HTN, HLD, nl EF echo 03/2011, breast CA, CHA2DS2VASc-5 (age x 2, female, HTN, DM), penetrating aneurysm of her thoracic aorta, treated conservatively  3/25 office visit, started on digoxin, blood pressure target, follow-up with Dr. Bridgett Bradley for the aneurysm, change Lasix to as needed, DC home O2 5/1 office visit with Dr. Bridgett Bradley, no continued surveillance, no procedures planned  Tara Bradley presents for cardiology follow up.  She is at peace with the decision not to do TEVAR for the aneurysm.   When she first takes her am medications, she will get a little dizzy, but it does not last.   Sometimes she feels her rings are tight, her ankles do not swell at all.   However, she gets tired a lot. Still has some DOE but is walking daily. Denies orthopnea or PND.   She does not have scales, will try to weigh at [place].   She is going to [place] in about 3 weeks for a visit.   Past Medical History:  Diagnosis Date  . Aortic valve sclerosis    Echo, 2008  . Arthritis   . Bradycardia    October, 2012  . Cancer ([place])   . Chest pain    Nuclear, April, 2008, no ischemia,  . Chronic insomnia    situational stress  . Closed fracture of unspecified part of femur 2005  . Diabetes mellitus, type 2 ([place])   . Diverticulitis   . Diverticulosis   . Ejection fraction    EF 60%, echo, February, 2008  //   EF 65-70%, echo, November, 2012  . Hyperlipidemia   . Hypertension   . Long term (current) use of anticoagulants   . Osteoporosis    femur fracture 2005, pelvic  fracture 2006  . Persistent atrial fibrillation ([place])   . Personal history of malignant neoplasm of breast   . Syncope   . Thoracic aortic aneurysm ([place]) 02/2017  . Unspecified closed fracture of pelvis 2006    Past Surgical History:  Procedure Laterality Date  . CARDIOVERSION N/A 08/09/2012   Procedure: CARDIOVERSION;  Surgeon: Tara Sprang, MD;  Location: [facility];  Service: Cardiovascular;  Laterality: N/A;  . EYE SURGERY    . FEMUR SURGERY  2005   ORIF  . IR ANGIOGRAM PELVIS SELECTIVE OR SUPRASELECTIVE  03/11/2017  . IR ANGIOGRAM SELECTIVE EACH ADDITIONAL VESSEL  03/11/2017  . IR EMBO ART  VEN HEMORR LYMPH EXTRAV  INC GUIDE ROADMAPPING  03/11/2017  . IR FLUORO GUIDE CV LINE RIGHT  03/11/2017  . IR US GUIDE VASC ACCESS RIGHT  03/11/2017  . IR US GUIDE VASC ACCESS RIGHT  03/11/2017  . MASTECTOMY  1995   left  . SHOULDER SURGERY     left  . [place], 2003  . TONSILLECTOMY    . TOTAL ABDOMINAL HYSTERECTOMY W/ BILATERAL SALPINGOOPHORECTOMY  1995  . WRIST SURGERY      x 2     Current Outpatient Medications  Medication Sig Dispense Refill  . acetaminophen (TYLENOL) 325  MG tablet Take 2 tablets (650 mg total) by mouth every 4 (four) hours as needed for mild pain.    . Ascorbic Acid (VITAMIN C PO) Take 1 tablet by mouth daily.    . B Complex-C (B-COMPLEX WITH VITAMIN C) tablet Take 1 tablet by mouth daily.    . Calcium Carbonate-Vitamin D (CALTRATE 600+D) 600-400 MG-UNIT per tablet Take 1 tablet by mouth daily.      . Cholecalciferol (VITAMIN D) 2000 UNITS tablet Take 1,000 Units by mouth daily.     . digoxin (LANOXIN) 0.125 MG tablet Take 0.0625 mg by mouth daily. One half tablet daily    . diltiazem (CARDIZEM CD) 360 MG 24 hr capsule Take 1 capsule (360 mg total) by mouth daily before breakfast.    . docusate sodium (COLACE) 100 MG capsule Take 1 capsule (100 mg total) by mouth 2 (two) times daily. 10 capsule 0  . furosemide (LASIX) 40 MG tablet Take 20 mg by  mouth daily.    . insulin lispro (HUMALOG) 100 UNIT/ML injection Inject 5-7 Units into the skin 2 (two) times daily. 7 units before breakfast, 5 units at bedtime    . losartan-hydrochlorothiazide (HYZAAR) 100-12.5 MG tablet Take 1 tablet by mouth every morning.    . Magnesium 250 MG TABS Take 1 tablet by mouth daily.      . metFORMIN (GLUMETZA) 500 MG (MOD) 24 hr tablet Take 1,000 mg by mouth every evening.    . metoprolol tartrate (LOPRESSOR) 50 MG tablet Take 1 tablet (50 mg total) by mouth 2 (two) times daily. 180 tablet 3  . Multiple Vitamins-Minerals (PRESERVISION AREDS PO) Take 1 capsule by mouth daily.    . potassium chloride (K-DUR) 10 MEQ tablet Take 10 mEq by mouth daily.     . temazepam (RESTORIL) 30 MG capsule TAKE ONE CAPSULE(30 MG)  AT BEDTIME AS NEEDED FOR SLEEP  5  . XARELTO 15 MG TABS tablet TAKE ONE TABLET EACH DAY WITH SUPPER 90 tablet 1   No current facility-administered medications for this visit.     Allergies:   Ambien [zolpidem tartrate]; Codeine; and Codeine sulfate    Social History:  The patient  reports that she has never smoked. She has never used smokeless tobacco. She reports that she does not drink alcohol or use drugs.   Family History:  The patient's family history includes Congestive Heart Failure (age of onset: 71) in her mother; Diabetes type II in her brother, father, and son; Heart attack (age of onset: 24) in her father; Multiple myeloma in her brother.    ROS:  Please see the history of present illness. All other systems are reviewed and negative.   PHYSICAL EXAM: VS:  BP 115/68   Pulse 96   Ht '5\' 4"'$  (1.626 m)   Wt 137 lb 6.4 oz (62.3 kg)   BMI 23.58 kg/m  , BMI Body mass index is 23.58 kg/m. GEN: Well nourished, well developed, female in no acute distress  HEENT: normal for age  Neck: JVD 9-10 cm, no carotid bruit, no masses Cardiac: RRR; no murmur, no rubs, or gallops Respiratory: rales bases, L>R, bilaterally, normal work of  breathing GI: soft, nontender, nondistended, + BS MS: no deformity or atrophy; no edema; distal pulses are 2+ in all 4 extremities   Skin: warm and dry, no rash Neuro:  Strength and sensation are intact Psych: euthymic mood, full affect   EKG:  EKG is ordered today. The ekg ordered today demonstrates atrial  fib, HR 96, inferior and lateral ST depression seen intermittently on other ECGs   Recent Labs: 03/11/2017: ALT 13 03/18/2017: BUN 16; Creatinine, Ser 0.52; Hemoglobin 9.2; Platelets 261; Potassium 4.4; Sodium 130    Lipid Panel    Component Value Date/Time   CHOL 132 03/13/2017 0431   TRIG 72 03/13/2017 0431   HDL 48 03/13/2017 0431   CHOLHDL 2.8 03/13/2017 0431   VLDL 14 03/13/2017 0431   LDLCALC 70 03/13/2017 0431   LDLDIRECT 37 03/10/2009 1106     Wt Readings from Last 3 Encounters:  11/01/17 137 lb 6.4 oz (62.3 kg)  09/21/17 132 lb (59.9 kg)  08/15/17 128 lb 12.8 oz (58.4 kg)     Other studies Reviewed: Additional studies/ records that were reviewed today include: Office notes, hospital records and testing.  ASSESSMENT AND PLAN:  1.  Permanent atrial fibrillation: Her digoxin dose was decreased and the subsequent level was 0.6.  Continue this.  Continue Cardizem CD 360 mg daily.  She is also to continue the metoprolol 50 mg twice daily.  2.  Chronic anticoagulation: She is compliant with Xarelto 15 mg daily and not having any bleeding issues.  3.  Acute on chronic diastolic CHF: Her weight is up a bit and she is having fatigue.  She is still pushing herself to do her exercise and walking etc., but she struggles a bit with it. -Her Lasix dose has been decreased in the past, but feels she needs to pull some fluid off. -Increase the Lasix to a whole tablet daily for 3 days.  Then go back to 1/2 tablet daily, but increase it to a whole tablet as needed for weight gain or edema. -She promises that she will either weigh daily at [facility], or get her own set of  scales. -BMET in 1 week at [employer].   Current medicines are reviewed at length with the patient today.  The patient does not have concerns regarding medicines.  The following changes have been made: Temporarily increase Lasix  Labs/ tests ordered today include:   Orders Placed This Encounter  Procedures  . Basic metabolic panel     Disposition:   FU with Dr. Percival Bradley  Signed, Tara Ferries, PA-C  11/01/2017 6:26 PM    [facility] Phone: [phone]; Fax: [phone]  This note was written with the assistance of speech recognition software. Please excuse any transcriptional errors.

## 2017-11-01 NOTE — Patient Instructions (Signed)
Medication Instructions:  INCREASE YOUR FUROSEMIDE (LASIX) TO A FULL TABLET FOR 3 DAYS AND THEN 1/2 TABLET DAILY. OK TO TAKE AN EXTRA 1/2 TABLET AS NEEDED FOR WEIGHT GAIN OF 3 POUNDS IN 24 HOURS OR 5 POUNDS IN 1 WEEK  Labwork: BMET IN 1 WEEK AT [facility]   Testing/Procedures: NONE  Follow-Up: Your physician recommends that you schedule a follow-up appointment in: [place]   If you need a refill on your cardiac medications before your next appointment, please call your pharmacy.

## 2017-11-02 ENCOUNTER — Telehealth: Payer: Self-pay | Admitting: Physician Assistant

## 2017-11-02 NOTE — Telephone Encounter (Signed)
New Message   Patient is calling because [name] wanted to know the dosage of the cardizem. She is advised that she takes 300mg .

## 2017-11-02 NOTE — Telephone Encounter (Signed)
Confirmed with [facility] patient did pick up Diltiazem CD 300 mg one daily #90 das supply on 36/19/19  Will forward to [place] B PA for review

## 2017-11-07 ENCOUNTER — Other Ambulatory Visit: Payer: Self-pay | Admitting: *Deleted

## 2017-11-07 DIAGNOSIS — Z5181 Encounter for therapeutic drug level monitoring: Secondary | ICD-10-CM

## 2017-11-08 ENCOUNTER — Other Ambulatory Visit: Payer: Self-pay | Admitting: *Deleted

## 2017-11-08 LAB — BASIC METABOLIC PANEL
BUN/Creatinine Ratio: 30 — ABNORMAL HIGH (ref 12–28)
BUN: 26 mg/dL (ref 10–36)
CO2: 28 mmol/L (ref 20–29)
Calcium: 9.9 mg/dL (ref 8.7–10.3)
Chloride: 96 mmol/L (ref 96–106)
Creatinine, Ser: 0.87 mg/dL (ref 0.57–1.00)
GFR calc Af Amer: 66 mL/min/{1.73_m2} (ref >59)
GFR calc non Af Amer: 58 mL/min/{1.73_m2} — ABNORMAL LOW (ref >59)
Glucose: 201 mg/dL — ABNORMAL HIGH (ref 65–99)
Potassium: 4 mmol/L (ref 3.5–5.2)
Sodium: 139 mmol/L (ref 134–144)

## 2017-11-12 ENCOUNTER — Other Ambulatory Visit: Payer: Self-pay | Admitting: Cardiology

## 2017-11-14 ENCOUNTER — Telehealth: Payer: Self-pay | Admitting: Cardiology

## 2017-11-14 NOTE — Telephone Encounter (Signed)
New message   Patient calling the office for samples of medication:   1.  What medication and dosage are you requesting samples for? Xarelto 2.  Are you currently out of this medication? no

## 2017-11-14 NOTE — Telephone Encounter (Signed)
Medication samples have been provided to the patient.  Drug name: Xarelto 15 mg   Qty: 14    LOT: [id number]  Exp.Date: 04/21  Samples left at front desk for patient pick-up. Patient notified.

## 2017-11-16 DIAGNOSIS — Z794 Long term (current) use of insulin: Secondary | ICD-10-CM | POA: Diagnosis not present

## 2017-11-16 DIAGNOSIS — M810 Age-related osteoporosis without current pathological fracture: Secondary | ICD-10-CM | POA: Diagnosis not present

## 2017-11-16 DIAGNOSIS — Z8781 Personal history of (healed) traumatic fracture: Secondary | ICD-10-CM | POA: Diagnosis not present

## 2017-11-16 DIAGNOSIS — E119 Type 2 diabetes mellitus without complications: Secondary | ICD-10-CM | POA: Diagnosis not present

## 2017-12-14 ENCOUNTER — Ambulatory Visit: Payer: Medicare Other | Admitting: Podiatry

## 2018-01-03 DIAGNOSIS — R509 Fever, unspecified: Secondary | ICD-10-CM | POA: Diagnosis not present

## 2018-01-03 DIAGNOSIS — N390 Urinary tract infection, site not specified: Secondary | ICD-10-CM | POA: Diagnosis not present

## 2018-01-24 ENCOUNTER — Other Ambulatory Visit: Payer: Self-pay | Admitting: Physician Assistant

## 2018-01-24 ENCOUNTER — Other Ambulatory Visit: Payer: Self-pay | Admitting: Cardiology

## 2018-01-24 NOTE — Telephone Encounter (Signed)
Rx sent to pharmacy   

## 2018-01-25 NOTE — Telephone Encounter (Signed)
Rx request sent to pharmacy.  

## 2018-02-01 ENCOUNTER — Encounter: Payer: Self-pay | Admitting: Cardiology

## 2018-02-01 NOTE — Progress Notes (Signed)
Cardiology Office Note   Date:  02/02/2018   ID:  Tara Bradley, DOB 07/15/1922, MRN [id number]  PCP:  [name], MD  Cardiologist: Dr. Percival Spanish, 08/15/2017 [name], MD 05/26/2017  No chief complaint on file.   History of Present Illness: Tara Bradley is a 84 y.o. female with a history of Ao sclerosis  And chronic afib on Xarelto.  Since she was last seen she is done very well. The patient denies any new symptoms such as chest discomfort, neck or arm discomfort. There has been no new shortness of breath, PND or orthopnea. There have been no reported palpitations, presyncope or syncope.     Past Medical History:  Diagnosis Date  . Aortic valve sclerosis    Echo, 2008  . Arthritis   . Bradycardia    October, 2012  . Cancer ([place])   . Chronic insomnia   . Closed fracture of unspecified part of femur 2005  . Diabetes mellitus, type 2 ([place])   . Diverticulitis   . Diverticulosis   . Ejection fraction    EF 60%, echo, February, 2008  //   EF 65-70%, echo, November, 2012  . Hyperlipidemia   . Hypertension   . Long term (current) use of anticoagulants   . Osteoporosis    femur fracture 2005, pelvic fracture 2006  . Persistent atrial fibrillation ([place])   . Personal history of malignant neoplasm of breast   . Syncope   . Thoracic aortic aneurysm ([place]) 02/2017  . Unspecified closed fracture of pelvis 2006    Past Surgical History:  Procedure Laterality Date  . CARDIOVERSION N/A 08/09/2012   Procedure: CARDIOVERSION;  Surgeon: Deboraha Sprang, MD;  Location: [facility];  Service: Cardiovascular;  Laterality: N/A;  . EYE SURGERY    . FEMUR SURGERY  2005   ORIF  . IR ANGIOGRAM PELVIS SELECTIVE OR SUPRASELECTIVE  03/11/2017  . IR ANGIOGRAM SELECTIVE EACH ADDITIONAL VESSEL  03/11/2017  . IR EMBO ART  VEN HEMORR LYMPH EXTRAV  INC GUIDE ROADMAPPING  03/11/2017  . IR FLUORO GUIDE CV LINE RIGHT  03/11/2017  . IR US GUIDE VASC ACCESS RIGHT  03/11/2017  . IR US  GUIDE VASC ACCESS RIGHT  03/11/2017  . MASTECTOMY  1995   left  . SHOULDER SURGERY     left  . [place], 2003  . TONSILLECTOMY    . TOTAL ABDOMINAL HYSTERECTOMY W/ BILATERAL SALPINGOOPHORECTOMY  1995  . WRIST SURGERY      x 2     Current Outpatient Medications  Medication Sig Dispense Refill  . acetaminophen (TYLENOL) 325 MG tablet Take 2 tablets (650 mg total) by mouth every 4 (four) hours as needed for mild pain.    . Ascorbic Acid (VITAMIN C PO) Take 1 tablet by mouth daily.    . B Complex-C (B-COMPLEX WITH VITAMIN C) tablet Take 1 tablet by mouth daily.    . Calcium Carbonate-Vitamin D (CALTRATE 600+D) 600-400 MG-UNIT per tablet Take 1 tablet by mouth daily.      . Cholecalciferol (VITAMIN D) 2000 UNITS tablet Take 1,000 Units by mouth daily.     . digoxin (LANOXIN) 0.125 MG tablet Take 0.0625 mg by mouth daily. One half tablet daily    . diltiazem (CARDIZEM CD) 360 MG 24 hr capsule Take 1 capsule (360 mg total) by mouth daily before breakfast.    . docusate sodium (COLACE) 100 MG capsule Take 1 capsule (100 mg total) by mouth  2 (two) times daily. 10 capsule 0  . furosemide (LASIX) 40 MG tablet Take 20 mg by mouth daily.    . insulin lispro (HUMALOG) 100 UNIT/ML injection Inject 5-7 Units into the skin 2 (two) times daily. 7 units before breakfast, 5 units at bedtime    . losartan-hydrochlorothiazide (HYZAAR) 100-12.5 MG tablet Take 1 tablet by mouth every morning.    . Magnesium 250 MG TABS Take 1 tablet by mouth daily.      . metFORMIN (GLUMETZA) 500 MG (MOD) 24 hr tablet Take 1,000 mg by mouth every evening.    . metoprolol tartrate (LOPRESSOR) 50 MG tablet Take 1 tablet (50 mg total) by mouth 2 (two) times daily. 180 tablet 3  . Multiple Vitamins-Minerals (PRESERVISION AREDS PO) Take 1 capsule by mouth daily.    . potassium chloride (K-DUR) 10 MEQ tablet Take 10 mEq by mouth daily.     . temazepam (RESTORIL) 30 MG capsule TAKE ONE CAPSULE(30 MG)  AT BEDTIME AS  NEEDED FOR SLEEP  5  . XARELTO 15 MG TABS tablet TAKE ONE TABLET EACH DAY WITH SUPPER 90 tablet 1   No current facility-administered medications for this visit.     Allergies:   Ambien [zolpidem tartrate]; Codeine; and Codeine sulfate    ROS:  Positive for none . All other systems are reviewed and negative.   PHYSICAL EXAM: VS:  BP 140/80 (BP Location: Right Arm, Patient Position: Sitting, Cuff Size: Normal)   Pulse (!) 54   Ht 5\' 4"  (1.626 m)   Wt 138 lb 9.6 oz (62.9 kg)   BMI 23.79 kg/m  , BMI Body mass index is 23.79 kg/m. PHYSICAL EXAM GEN:  No distress NECK:  No jugular venous distention at 90 degrees, waveform within normal limits, carotid upstroke brisk and symmetric, no bruits, no thyromegaly LUNGS:  Clear to auscultation bilaterally CHEST:  Unremarkable HEART:  S1 and S2 within normal limits, no S3, , no clicks, no rubs, no murmurs, irregular ABD:  Positive bowel sounds normal in frequency in pitch, no bruits, no rebound, no guarding, unable to assess midline mass or bruit with the patient seated. EXT:  2 plus pulses throughout, moderate edema, no cyanosis no clubbing   EKG:  EKG is not ordered today.   Recent Labs: 03/11/2017: ALT 13 03/18/2017: Hemoglobin 9.2; Platelets 261 11/07/2017: BUN 26; Creatinine, Ser 0.87; Potassium 4.0; Sodium 139    Lipid Panel    Component Value Date/Time   CHOL 132 03/13/2017 0431   TRIG 72 03/13/2017 0431   HDL 48 03/13/2017 0431   CHOLHDL 2.8 03/13/2017 0431   VLDL 14 03/13/2017 0431   LDLCALC 70 03/13/2017 0431   LDLDIRECT 37 03/10/2009 1106     Wt Readings from Last 3 Encounters:  02/02/18 138 lb 9.6 oz (62.9 kg)  11/01/17 137 lb 6.4 oz (62.3 kg)  09/21/17 132 lb (59.9 kg)     Other studies Reviewed: Additional studies/ records that were reviewed today include:  CT  ASSESSMENT AND PLAN:  Permanent atrial fibrillation:    [name] has a CHA2DS2 - VASc score of 5.   She tolerates anticoagulation.  No  change in therapy is indicated.  She seems to have good rate control.  Chronic diastolic CHF:   She is euvolemic.  Her dose of diuretic was reduced slightly at the last visit.  No change in therapy.  Her creatinine was stable when checked in follow-up.   Aortic aneurysm: She will continue with  conservative therapy.  Aortic valve sclerosis: She is having no symptoms related to this.  No change in therapy.   Current medicines are reviewed at length with the patient today.  The patient does not have concerns regarding medicines.  The following changes have been made:   None  Labs/ tests ordered today include: None  No orders of the defined types were placed in this encounter.    Disposition:    Follow up in one year.   Signed, [name], MD  02/02/2018 1:33 PM    [facility]

## 2018-02-02 ENCOUNTER — Ambulatory Visit (INDEPENDENT_AMBULATORY_CARE_PROVIDER_SITE_OTHER): Payer: Medicare Other | Admitting: Cardiology

## 2018-02-02 ENCOUNTER — Encounter: Payer: Self-pay | Admitting: Cardiology

## 2018-02-02 VITALS — BP 140/80 | HR 54 | Ht 64.0 in | Wt 138.6 lb

## 2018-02-02 DIAGNOSIS — I482 Chronic atrial fibrillation, unspecified: Secondary | ICD-10-CM

## 2018-02-02 DIAGNOSIS — I5032 Chronic diastolic (congestive) heart failure: Secondary | ICD-10-CM | POA: Diagnosis not present

## 2018-02-02 NOTE — Patient Instructions (Addendum)
Medication Instructions:  Your physician recommends that you continue on your current medications as directed. Please refer to the Current Medication list given to you today.   Labwork: None ordered  Testing/Procedures: None ordered  Follow-Up: Your physician wants you to follow-up in: 1 year with Dr.Hochrein You will receive a reminder letter in the mail two months in advance. If you don't receive a letter, please call our office to schedule the follow-up appointment.   Any Other Special Instructions Will Be Listed Below (If Applicable).     If you need a refill on your cardiac medications before your next appointment, please call your pharmacy.   

## 2018-02-07 ENCOUNTER — Other Ambulatory Visit: Payer: Self-pay | Admitting: General Surgery

## 2018-02-07 DIAGNOSIS — Z1231 Encounter for screening mammogram for malignant neoplasm of breast: Secondary | ICD-10-CM

## 2018-02-24 ENCOUNTER — Other Ambulatory Visit: Payer: Self-pay | Admitting: Surgery

## 2018-02-24 DIAGNOSIS — I359 Nonrheumatic aortic valve disorder, unspecified: Secondary | ICD-10-CM | POA: Diagnosis not present

## 2018-02-24 DIAGNOSIS — R1900 Intra-abdominal and pelvic swelling, mass and lump, unspecified site: Secondary | ICD-10-CM | POA: Diagnosis not present

## 2018-02-24 DIAGNOSIS — I129 Hypertensive chronic kidney disease with stage 1 through stage 4 chronic kidney disease, or unspecified chronic kidney disease: Secondary | ICD-10-CM | POA: Diagnosis not present

## 2018-02-24 DIAGNOSIS — E1122 Type 2 diabetes mellitus with diabetic chronic kidney disease: Secondary | ICD-10-CM | POA: Diagnosis not present

## 2018-02-24 DIAGNOSIS — E782 Mixed hyperlipidemia: Secondary | ICD-10-CM | POA: Diagnosis not present

## 2018-02-24 DIAGNOSIS — I4811 Longstanding persistent atrial fibrillation: Secondary | ICD-10-CM | POA: Diagnosis not present

## 2018-02-24 DIAGNOSIS — E02 Subclinical iodine-deficiency hypothyroidism: Secondary | ICD-10-CM | POA: Diagnosis not present

## 2018-02-24 DIAGNOSIS — Z23 Encounter for immunization: Secondary | ICD-10-CM | POA: Diagnosis not present

## 2018-02-24 DIAGNOSIS — Z79899 Other long term (current) drug therapy: Secondary | ICD-10-CM | POA: Diagnosis not present

## 2018-02-24 DIAGNOSIS — I712 Thoracic aortic aneurysm, without rupture: Secondary | ICD-10-CM | POA: Diagnosis not present

## 2018-02-24 DIAGNOSIS — Z0000 Encounter for general adult medical examination without abnormal findings: Secondary | ICD-10-CM | POA: Diagnosis not present

## 2018-02-24 DIAGNOSIS — M810 Age-related osteoporosis without current pathological fracture: Secondary | ICD-10-CM | POA: Diagnosis not present

## 2018-03-06 ENCOUNTER — Ambulatory Visit: Payer: Medicare Other

## 2018-03-09 DIAGNOSIS — H35372 Puckering of macula, left eye: Secondary | ICD-10-CM | POA: Diagnosis not present

## 2018-03-09 DIAGNOSIS — H353131 Nonexudative age-related macular degeneration, bilateral, early dry stage: Secondary | ICD-10-CM | POA: Diagnosis not present

## 2018-05-05 DIAGNOSIS — Z794 Long term (current) use of insulin: Secondary | ICD-10-CM | POA: Diagnosis not present

## 2018-05-05 DIAGNOSIS — E119 Type 2 diabetes mellitus without complications: Secondary | ICD-10-CM | POA: Diagnosis not present

## 2018-05-05 DIAGNOSIS — M810 Age-related osteoporosis without current pathological fracture: Secondary | ICD-10-CM | POA: Diagnosis not present

## 2018-05-05 DIAGNOSIS — Z8781 Personal history of (healed) traumatic fracture: Secondary | ICD-10-CM | POA: Diagnosis not present

## 2018-05-22 ENCOUNTER — Other Ambulatory Visit: Payer: Self-pay | Admitting: Cardiology

## 2018-06-07 ENCOUNTER — Other Ambulatory Visit: Payer: Self-pay | Admitting: Cardiology

## 2018-06-19 ENCOUNTER — Telehealth: Payer: Self-pay | Admitting: *Deleted

## 2018-06-19 ENCOUNTER — Other Ambulatory Visit: Payer: Self-pay | Admitting: Cardiology

## 2018-06-19 MED ORDER — METOPROLOL TARTRATE 50 MG PO TABS: 50.0000 mg | ORAL_TABLET | Freq: Two times a day (BID) | ORAL | 1 refills | Status: DC

## 2018-06-19 MED ORDER — METOPROLOL TARTRATE 25 MG PO TABS: 25.0000 mg | ORAL_TABLET | Freq: Two times a day (BID) | ORAL | 3 refills | Status: DC

## 2018-06-19 NOTE — Telephone Encounter (Signed)
Leave message for pt to call back 

## 2018-06-19 NOTE — Telephone Encounter (Signed)
-----   Message from [name], MD sent at 06/18/2018 10:52 AM EST ----- She needs a renewal on hr beta blocker.  However, we have a different dose listed from the prescription she is requesting.  Please call her to clarify and renew the prescription.

## 2018-06-19 NOTE — Telephone Encounter (Signed)
New Message:    Patient calling because she has some concerns about her medication refill. The pharmacy denied her refill. Please call patient.

## 2018-06-19 NOTE — Telephone Encounter (Signed)
Call and spoke with pt, pt stated she is taking Metoprolol tartrate 25 mg twice a day, Metoprolol 25 mg # 180 3 refills send into pt pharmacy.

## 2018-06-19 NOTE — Telephone Encounter (Signed)
Called patient, discussed medication metoprolol. Sent in RX for 50 mg one tablet BID. Patient verbalized correct dosing, and pharmacy.

## 2018-06-27 ENCOUNTER — Other Ambulatory Visit: Payer: Self-pay | Admitting: Cardiology

## 2018-07-20 ENCOUNTER — Other Ambulatory Visit: Payer: Self-pay | Admitting: Cardiology

## 2018-07-20 NOTE — Telephone Encounter (Signed)
Rx(s) sent to pharmacy electronically.  

## 2018-08-21 ENCOUNTER — Other Ambulatory Visit: Payer: Self-pay | Admitting: Cardiology

## 2018-08-21 ENCOUNTER — Other Ambulatory Visit: Payer: Self-pay | Admitting: Pharmacist Clinician (PhC)/ Clinical Pharmacy Specialist

## 2018-08-21 MED ORDER — RIVAROXABAN 15 MG PO TABS: ORAL_TABLET | ORAL | 1 refills | Status: DC

## 2018-08-21 NOTE — Telephone Encounter (Signed)
done

## 2018-08-21 NOTE — Telephone Encounter (Signed)
°*  STAT* If patient is at the pharmacy, call can be transferred to refill team.   1. Which medications need to be refilled? (please list name of each medication and dose if known) XARELTO 15 MG TABS tablet  2. Which pharmacy/location (including street and city if local pharmacy) is medication to be sent to? [facility] [place], [place] - [address]  3. Do they need a 30 day or 90 day supply? 90 days

## 2018-09-18 DIAGNOSIS — N182 Chronic kidney disease, stage 2 (mild): Secondary | ICD-10-CM | POA: Diagnosis not present

## 2018-09-18 DIAGNOSIS — M810 Age-related osteoporosis without current pathological fracture: Secondary | ICD-10-CM | POA: Diagnosis not present

## 2018-09-18 DIAGNOSIS — I4819 Other persistent atrial fibrillation: Secondary | ICD-10-CM | POA: Diagnosis not present

## 2018-09-18 DIAGNOSIS — Z794 Long term (current) use of insulin: Secondary | ICD-10-CM | POA: Diagnosis not present

## 2018-09-18 DIAGNOSIS — E1122 Type 2 diabetes mellitus with diabetic chronic kidney disease: Secondary | ICD-10-CM | POA: Diagnosis not present

## 2018-09-18 DIAGNOSIS — I10 Essential (primary) hypertension: Secondary | ICD-10-CM | POA: Diagnosis not present

## 2018-09-18 DIAGNOSIS — E782 Mixed hyperlipidemia: Secondary | ICD-10-CM | POA: Diagnosis not present

## 2018-09-20 DIAGNOSIS — N182 Chronic kidney disease, stage 2 (mild): Secondary | ICD-10-CM | POA: Diagnosis not present

## 2018-09-20 DIAGNOSIS — E1122 Type 2 diabetes mellitus with diabetic chronic kidney disease: Secondary | ICD-10-CM | POA: Diagnosis not present

## 2018-09-20 DIAGNOSIS — M810 Age-related osteoporosis without current pathological fracture: Secondary | ICD-10-CM | POA: Diagnosis not present

## 2018-09-20 DIAGNOSIS — E782 Mixed hyperlipidemia: Secondary | ICD-10-CM | POA: Diagnosis not present

## 2018-09-25 ENCOUNTER — Other Ambulatory Visit: Payer: Self-pay

## 2018-09-25 ENCOUNTER — Encounter (INDEPENDENT_AMBULATORY_CARE_PROVIDER_SITE_OTHER): Payer: Medicare HMO | Admitting: Ophthalmology

## 2018-09-25 DIAGNOSIS — H35033 Hypertensive retinopathy, bilateral: Secondary | ICD-10-CM

## 2018-09-25 DIAGNOSIS — H353132 Nonexudative age-related macular degeneration, bilateral, intermediate dry stage: Secondary | ICD-10-CM

## 2018-09-25 DIAGNOSIS — H43313 Vitreous membranes and strands, bilateral: Secondary | ICD-10-CM | POA: Diagnosis not present

## 2018-09-25 DIAGNOSIS — I10 Essential (primary) hypertension: Secondary | ICD-10-CM

## 2018-10-09 ENCOUNTER — Encounter (INDEPENDENT_AMBULATORY_CARE_PROVIDER_SITE_OTHER): Payer: Medicare Other | Admitting: Ophthalmology

## 2018-11-08 ENCOUNTER — Telehealth: Payer: Self-pay | Admitting: *Deleted

## 2018-11-08 NOTE — Telephone Encounter (Signed)
Tara Bradley, will call and schedule when she return from [place].
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
## 2019-03-07 DIAGNOSIS — I5032 Chronic diastolic (congestive) heart failure: Secondary | ICD-10-CM | POA: Insufficient documentation

## 2019-03-07 DIAGNOSIS — I4821 Permanent atrial fibrillation: Secondary | ICD-10-CM | POA: Insufficient documentation

## 2019-03-07 DIAGNOSIS — I7101 Dissection of thoracic aorta: Secondary | ICD-10-CM | POA: Insufficient documentation

## 2019-03-07 DIAGNOSIS — I71012 Dissection of descending thoracic aorta: Secondary | ICD-10-CM | POA: Insufficient documentation

## 2019-03-07 NOTE — Progress Notes (Signed)
Cardiology Office Note   Date:  03/09/2019   ID:  ALYZZA PUCK, DOB [birth year], MRN [id number]  PCP:  [name], MD  Cardiologist: Dr. Percival Spanish, 08/15/2017 [name], MD 05/26/2017  Chief Complaint  Patient presents with  . Atrial Fibrillation    History of Present Illness: Tara Bradley is a 85 y.o. female with a history of an aortic thrombosed descending aortic dissection, aortic valve sclerosis and chronic afib on Xarelto.  She is done well since I last saw her.  [place]  [place] but she did not think it was very safe from a COVID standpoint that she is moved into her own place. The patient denies any new symptoms such as chest discomfort, neck or arm discomfort. There has been no new shortness of breath, PND or orthopnea. There have been no reported palpitations, presyncope or syncope.   Past Medical History:  Diagnosis Date  . Aortic valve sclerosis    Echo, 2008  . Arthritis   . Bradycardia    October, 2012  . Cancer ([place])   . Chronic insomnia   . Closed fracture of unspecified part of femur 2005  . Diabetes mellitus, type 2 ([place])   . Diverticulitis   . Diverticulosis   . Ejection fraction    EF 60%, echo, February, 2008  //   EF 65-70%, echo, November, 2012  . Hyperlipidemia   . Hypertension   . Long term (current) use of anticoagulants   . Osteoporosis    femur fracture 2005, pelvic fracture 2006  . Persistent atrial fibrillation ([place])   . Personal history of malignant neoplasm of breast   . Syncope   . Thoracic aortic aneurysm ([place]) 02/2017  . Unspecified closed fracture of pelvis 2006    Past Surgical History:  Procedure Laterality Date  . CARDIOVERSION N/A 08/09/2012   Procedure: CARDIOVERSION;  Surgeon: Deboraha Sprang, MD;  Location: [facility];  Service: Cardiovascular;  Laterality: N/A;  . EYE SURGERY    . FEMUR SURGERY  2005   ORIF  . IR ANGIOGRAM PELVIS  SELECTIVE OR SUPRASELECTIVE  03/11/2017  . IR ANGIOGRAM SELECTIVE EACH ADDITIONAL VESSEL  03/11/2017  . IR EMBO ART  VEN HEMORR LYMPH EXTRAV  INC GUIDE ROADMAPPING  03/11/2017  . IR FLUORO GUIDE CV LINE RIGHT  03/11/2017  . IR US GUIDE VASC ACCESS RIGHT  03/11/2017  . IR US GUIDE VASC ACCESS RIGHT  03/11/2017  . MASTECTOMY  1995   left  . SHOULDER SURGERY     left  . [place], 2003  . TONSILLECTOMY    . TOTAL ABDOMINAL HYSTERECTOMY W/ BILATERAL SALPINGOOPHORECTOMY  1995  . WRIST SURGERY      x 2     Current Outpatient Medications  Medication Sig Dispense Refill  . acetaminophen (TYLENOL) 325 MG tablet Take 2 tablets (650 mg total) by mouth every 4 (four) hours as needed for mild pain.    . Ascorbic Acid (VITAMIN C PO) Take 1 tablet by mouth daily.    . B Complex-C (B-COMPLEX WITH VITAMIN C) tablet Take 1 tablet by mouth daily.    . Calcium Carbonate-Vitamin D (CALTRATE 600+D) 600-400 MG-UNIT per tablet Take 1 tablet by mouth daily.      . Cholecalciferol (VITAMIN D) 2000 UNITS tablet Take 1,000 Units by mouth daily.     . digoxin (LANOXIN) 0.125 MG tablet Take 0.0625  mg by mouth daily. One half tablet daily    . diltiazem (CARDIZEM CD) 360 MG 24 hr capsule Take 1 capsule (360 mg total) by mouth daily before breakfast.    . docusate sodium (COLACE) 100 MG capsule Take 1 capsule (100 mg total) by mouth 2 (two) times daily. 10 capsule 0  . furosemide (LASIX) 40 MG tablet Take 20 mg by mouth daily.    . insulin lispro (HUMALOG) 100 UNIT/ML injection Inject 5-7 Units into the skin 2 (two) times daily. 7 units before breakfast, 5 units at bedtime    . losartan-hydrochlorothiazide (HYZAAR) 100-12.5 MG tablet Take 1 tablet by mouth every morning.    . Magnesium 250 MG TABS Take 1 tablet by mouth daily.      . metFORMIN (GLUMETZA) 500 MG (MOD) 24 hr tablet Take 1,000 mg by mouth every evening.    . metoprolol tartrate (LOPRESSOR) 50 MG tablet Take 1 tablet (50 mg total) by mouth  2 (two) times daily. 180 tablet 3  . Multiple Vitamins-Minerals (PRESERVISION AREDS PO) Take 1 capsule by mouth daily.    . potassium chloride (K-DUR) 10 MEQ tablet Take 10 mEq by mouth daily.     . temazepam (RESTORIL) 30 MG capsule TAKE ONE CAPSULE(30 MG)  AT BEDTIME AS NEEDED FOR SLEEP  5  . XARELTO 15 MG TABS tablet TAKE ONE TABLET EACH DAY WITH SUPPER 90 tablet 1   No current facility-administered medications for this visit.     Allergies:   Ambien [zolpidem tartrate], Codeine, and Codeine sulfate    ROS:  Positive for none . All other systems are reviewed and negative.   PHYSICAL EXAM: VS:  BP (!) 156/89   Pulse 68   Temp (!) 96.8 F (36 C)   Ht 5\' 4"  (1.626 m)   Wt 137 lb (62.1 kg)   SpO2 92%   BMI 23.52 kg/m  , BMI Body mass index is 23.52 kg/m. GENERAL:  Well appearing NECK:  No jugular venous distention, waveform within normal limits, carotid upstroke brisk and symmetric, no bruits, no thyromegaly LUNGS:  Clear to auscultation bilaterally CHEST:  Unremarkable HEART:  PMI not displaced or sustained,S1 and S2 within normal limits, no S3, no clicks, no rubs, no murmurs irregular ABD:  Flat, positive bowel sounds normal in frequency in pitch, no bruits, no rebound, no guarding, no midline pulsatile mass, no hepatomegaly, no splenomegaly EXT:  2 plus pulses throughout, no edema, no cyanosis no clubbing   EKG:  EKG is  ordered today. Atrial fibrillation, rate 68, axis within normal limits, intervals within normal limits, old anteroseptal infarct.  Recent Labs: No results found for requested labs within last 8760 hours.    Lipid Panel    Component Value Date/Time   CHOL 132 03/13/2017 0431   TRIG 72 03/13/2017 0431   HDL 48 03/13/2017 0431   CHOLHDL 2.8 03/13/2017 0431   VLDL 14 03/13/2017 0431   LDLCALC 70 03/13/2017 0431   LDLDIRECT 37 03/10/2009 1106     Wt Readings from Last 3 Encounters:  03/09/19 137 lb (62.1 kg)  02/02/18 138 lb 9.6 oz (62.9 kg)   11/01/17 137 lb 6.4 oz (62.3 kg)     Other studies Reviewed: Additional studies/ records that were reviewed today include:  None  ASSESSMENT AND PLAN  Permanent atrial fibrillation:    Tara Bradley has a CHA2DS2 - VASc score of 5.    Tolerates anticoagulation.  She has good rate control.  No change in therapy.   I am going to check a CBC today.  I will check a basic metabolic profile.  Chronic diastolic CHF:   She is euvolemic.  No change in therapy.  Aortic aneurysm with chronic descending dissection:  Given her advanced age this is being managed conservatively.   She and I talked about this and there is no reason for further imaging.  Aortic valve sclerosis: She is having no symptoms related to this.  No change in therapy.   Current medicines are reviewed at length with the patient today.  The patient does not have concerns regarding medicines.  The following changes have been made:     Labs/ tests ordered today include:    Orders Placed This Encounter  Procedures  . Flu Vaccine QUAD High Dose(Fluad)  . Basic metabolic panel  . CBC  . EKG 12-Lead     Disposition:    Follow up in one year.   Signed, [name], MD  03/09/2019 1:33 PM    [facility]

## 2019-03-09 ENCOUNTER — Other Ambulatory Visit: Payer: Self-pay

## 2019-03-09 ENCOUNTER — Encounter: Payer: Self-pay | Admitting: Cardiology

## 2019-03-09 ENCOUNTER — Ambulatory Visit: Payer: Medicare Other | Admitting: Cardiology

## 2019-03-09 VITALS — BP 156/89 | HR 68 | Temp 96.8°F | Ht 64.0 in | Wt 137.0 lb

## 2019-03-09 DIAGNOSIS — I719 Aortic aneurysm of unspecified site, without rupture: Secondary | ICD-10-CM

## 2019-03-09 DIAGNOSIS — I358 Other nonrheumatic aortic valve disorders: Secondary | ICD-10-CM

## 2019-03-09 DIAGNOSIS — I7101 Dissection of thoracic aorta: Secondary | ICD-10-CM

## 2019-03-09 DIAGNOSIS — I5032 Chronic diastolic (congestive) heart failure: Secondary | ICD-10-CM

## 2019-03-09 DIAGNOSIS — Z23 Encounter for immunization: Secondary | ICD-10-CM

## 2019-03-09 DIAGNOSIS — I4821 Permanent atrial fibrillation: Secondary | ICD-10-CM | POA: Diagnosis not present

## 2019-03-09 DIAGNOSIS — I71012 Dissection of descending thoracic aorta: Secondary | ICD-10-CM

## 2019-03-09 NOTE — Patient Instructions (Signed)
Medication Instructions:  The current medical regimen is effective;  continue present plan and medications as directed. Please refer to the Current Medication list given to you today. If you need a refill on your cardiac medications before your next appointment, please call your pharmacy.  Labwork: BMET AND CBC TODAY HERE IN OUR OFFICE AT [facility]   If you have labs (blood work) drawn today and your tests are completely normal, you will receive your results only by: [name] MyChart Message (if you have MyChart) OR . A paper copy in the mail If you have any lab test that is abnormal or we need to change your treatment, we will call you to review the results.  Follow-Up: WITH You may see [name], MD or one of the following Advanced Practice Providers on your designated Care Team:  [name], PA-C Jory Sims, DNP, ANP Cadence Kathlen Mody, NP  12 months. In Person   Please call our office in advance, AUGUST 2021 to schedule this October 2021 appointment.   At [facility], you and your health needs are our priority.  As part of our continuing mission to provide you with exceptional heart care, we have created designated Provider Care Teams.  These Care Teams include your primary Cardiologist (physician) and Advanced Practice Providers (APPs -  Physician Assistants and Nurse Practitioners) who all work together to provide you with the care you need, when you need it.  Thank you for choosing [facility] at [facility]!!

## 2019-03-10 LAB — BASIC METABOLIC PANEL
BUN/Creatinine Ratio: 14 (ref 12–28)
BUN: 12 mg/dL (ref 10–36)
CO2: 29 mmol/L (ref 20–29)
Calcium: 10 mg/dL (ref 8.7–10.3)
Chloride: 83 mmol/L — ABNORMAL LOW (ref 96–106)
Creatinine, Ser: 0.87 mg/dL (ref 0.57–1.00)
GFR calc Af Amer: 66 mL/min/{1.73_m2} (ref >59)
GFR calc non Af Amer: 57 mL/min/{1.73_m2} — ABNORMAL LOW (ref >59)
Glucose: 78 mg/dL (ref 65–99)
Potassium: 3.6 mmol/L (ref 3.5–5.2)
Sodium: 126 mmol/L — ABNORMAL LOW (ref 134–144)

## 2019-03-10 LAB — CBC
Hematocrit: 40.6 % (ref 34.0–46.6)
Hemoglobin: 14.2 g/dL (ref 11.1–15.9)
MCH: 32.2 pg (ref 26.6–33.0)
MCHC: 35 g/dL (ref 31.5–35.7)
MCV: 92 fL (ref 79–97)
Platelets: 219 10*3/uL (ref 150–450)
RBC: 4.41 x10E6/uL (ref 3.77–5.28)
RDW: 13 % (ref 11.7–15.4)
WBC: 6.8 10*3/uL (ref 3.4–10.8)

## 2019-03-13 ENCOUNTER — Telehealth: Payer: Self-pay | Admitting: *Deleted

## 2019-03-13 DIAGNOSIS — E871 Hypo-osmolality and hyponatremia: Secondary | ICD-10-CM

## 2019-03-13 NOTE — Telephone Encounter (Signed)
Advised patient of lab results, mailed lab order and sent to PCP

## 2019-03-13 NOTE — Telephone Encounter (Signed)
-----   Message from [name], MD sent at 03/11/2019  2:18 PM EDT ----- Na is low.  See if she can reduce her free water intake and repeat a BMET in one month.  Call Ms. Rodena Piety with the results and send results to [name], MD

## 2019-04-07 LAB — BASIC METABOLIC PANEL
BUN/Creatinine Ratio: 18 (ref 12–28)
BUN: 17 mg/dL (ref 10–36)
CO2: 28 mmol/L (ref 20–29)
Calcium: 9.8 mg/dL (ref 8.7–10.3)
Chloride: 94 mmol/L — ABNORMAL LOW (ref 96–106)
Creatinine, Ser: 0.93 mg/dL (ref 0.57–1.00)
GFR calc Af Amer: 61 mL/min/{1.73_m2} (ref >59)
GFR calc non Af Amer: 53 mL/min/{1.73_m2} — ABNORMAL LOW (ref >59)
Glucose: 92 mg/dL (ref 65–99)
Potassium: 4.1 mmol/L (ref 3.5–5.2)
Sodium: 139 mmol/L (ref 134–144)

## 2019-04-30 ENCOUNTER — Telehealth: Payer: Self-pay | Admitting: Cardiology

## 2019-04-30 MED ORDER — DILTIAZEM HCL ER COATED BEADS 300 MG PO CP24: 300.0000 mg | ORAL_CAPSULE | Freq: Every day | ORAL | 2 refills | Status: DC

## 2019-04-30 NOTE — Telephone Encounter (Signed)
Refilled Rx per request. Pt aware

## 2019-04-30 NOTE — Telephone Encounter (Signed)
New Message  Pt c/o medication issue:  1. Name of Medication: diltiazem (CARDIZEM CD) 300 MG 24 hr capsule  2. How are you currently taking this medication (dosage and times per day)? As written  3. Are you having a reaction (difficulty breathing--STAT)? No  4. What is your medication issue? Out of medication; need new prescription

## 2019-05-11 ENCOUNTER — Other Ambulatory Visit: Payer: Self-pay | Admitting: *Deleted

## 2019-05-11 MED ORDER — RIVAROXABAN 15 MG PO TABS: ORAL_TABLET | ORAL | 1 refills | Status: DC

## 2019-06-12 ENCOUNTER — Other Ambulatory Visit: Payer: Self-pay | Admitting: Cardiology

## 2019-06-12 NOTE — Telephone Encounter (Signed)
Rx(s) sent to pharmacy electronically.  

## 2019-07-03 ENCOUNTER — Other Ambulatory Visit: Payer: Self-pay | Admitting: Cardiology

## 2019-09-06 ENCOUNTER — Other Ambulatory Visit: Payer: Self-pay

## 2019-09-06 ENCOUNTER — Encounter: Payer: Self-pay | Admitting: Physical Therapy

## 2019-09-06 ENCOUNTER — Other Ambulatory Visit: Payer: Self-pay | Admitting: Cardiology

## 2019-09-06 ENCOUNTER — Ambulatory Visit: Payer: Medicare Other | Attending: Family Medicine | Admitting: Physical Therapy

## 2019-09-06 DIAGNOSIS — M79604 Pain in right leg: Secondary | ICD-10-CM | POA: Diagnosis not present
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
## 2019-09-06 NOTE — Patient Instructions (Signed)
Access Code: [email or web address] URL: [email or web address] Date: 09/06/2019 Prepared by: Madelyn Flavors  Exercises Quadricep Stretch with Chair and Counter Support - 2 x daily - 7 x weekly - 1 sets - 2 reps - 30-60 sec hold Seated Hip Adductor Stretch - 2 x daily - 7 x weekly - 1 sets - 2 reps - 60 sec hold  Patient Education Trigger Point Dry Needling

## 2019-09-12 ENCOUNTER — Other Ambulatory Visit: Payer: Self-pay

## 2019-09-12 ENCOUNTER — Encounter: Payer: Self-pay | Admitting: Physical Therapy

## 2019-09-12 ENCOUNTER — Ambulatory Visit: Payer: Medicare Other | Admitting: Physical Therapy

## 2019-09-12 DIAGNOSIS — M79604 Pain in right leg: Secondary | ICD-10-CM | POA: Diagnosis not present
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
## 2019-09-14 ENCOUNTER — Other Ambulatory Visit (HOSPITAL_COMMUNITY): Payer: Self-pay

## 2019-09-17 ENCOUNTER — Encounter (HOSPITAL_COMMUNITY): Payer: Medicare Other

## 2019-09-21 ENCOUNTER — Other Ambulatory Visit: Payer: Self-pay

## 2019-09-21 ENCOUNTER — Ambulatory Visit (HOSPITAL_COMMUNITY)
Admission: RE | Admit: 2019-09-21 | Discharge: 2019-09-21 | Disposition: A | Discharge status: Home / Self Care | Payer: Medicare Other | Source: Ambulatory Visit | Attending: Internal Medicine | Admitting: Internal Medicine

## 2019-09-21 DIAGNOSIS — M810 Age-related osteoporosis without current pathological fracture: Secondary | ICD-10-CM | POA: Insufficient documentation

## 2019-09-21 MED ORDER — ZOLEDRONIC ACID 5 MG/100ML IV SOLN
INTRAVENOUS | Status: AC
  Administered 2019-09-21: 5 mg
  Filled 2019-09-21: qty 100

## 2019-10-03 ENCOUNTER — Encounter: Payer: Medicare Other | Admitting: Physical Therapy

## 2019-10-09 ENCOUNTER — Encounter: Payer: Medicare Other | Admitting: Physical Therapy

## 2019-10-16 ENCOUNTER — Encounter: Payer: Medicare Other | Admitting: Physical Therapy

## 2019-10-19 ENCOUNTER — Emergency Department (HOSPITAL_COMMUNITY): Payer: Medicare Other

## 2019-10-19 ENCOUNTER — Encounter (HOSPITAL_COMMUNITY): Payer: Self-pay | Admitting: Emergency Medicine

## 2019-10-19 ENCOUNTER — Other Ambulatory Visit: Payer: Self-pay

## 2019-10-19 ENCOUNTER — Emergency Department (HOSPITAL_COMMUNITY): Payer: Medicare Other | Admitting: Anesthesiology

## 2019-10-19 ENCOUNTER — Ambulatory Visit (HOSPITAL_COMMUNITY)
Admission: EM | Admit: 2019-10-19 | Discharge: 2019-10-21 | Disposition: A | Discharge status: Home / Self Care | Payer: Medicare Other | Attending: Orthopedic Surgery | Admitting: Orthopedic Surgery

## 2019-10-19 ENCOUNTER — Encounter (HOSPITAL_COMMUNITY)
Admission: EM | Disposition: A | Discharge status: Home / Self Care | Payer: Self-pay | Source: Home / Self Care | Attending: Emergency Medicine

## 2019-10-19 DIAGNOSIS — W010XXA Fall on same level from slipping, tripping and stumbling without subsequent striking against object, initial encounter: Secondary | ICD-10-CM | POA: Insufficient documentation

## 2019-10-19 DIAGNOSIS — S52352A Displaced comminuted fracture of shaft of radius, left arm, initial encounter for closed fracture: Secondary | ICD-10-CM | POA: Diagnosis not present

## 2019-10-19 DIAGNOSIS — Z79899 Other long term (current) drug therapy: Secondary | ICD-10-CM | POA: Diagnosis not present

## 2019-10-19 DIAGNOSIS — I110 Hypertensive heart disease with heart failure: Secondary | ICD-10-CM | POA: Insufficient documentation

## 2019-10-19 DIAGNOSIS — Z7901 Long term (current) use of anticoagulants: Secondary | ICD-10-CM | POA: Diagnosis not present

## 2019-10-19 DIAGNOSIS — I5032 Chronic diastolic (congestive) heart failure: Secondary | ICD-10-CM | POA: Diagnosis not present

## 2019-10-19 DIAGNOSIS — Z20822 Contact with and (suspected) exposure to covid-19: Secondary | ICD-10-CM | POA: Diagnosis not present

## 2019-10-19 DIAGNOSIS — Z23 Encounter for immunization: Secondary | ICD-10-CM | POA: Insufficient documentation

## 2019-10-19 DIAGNOSIS — E119 Type 2 diabetes mellitus without complications: Secondary | ICD-10-CM | POA: Insufficient documentation

## 2019-10-19 DIAGNOSIS — S0990XA Unspecified injury of head, initial encounter: Secondary | ICD-10-CM | POA: Diagnosis not present

## 2019-10-19 DIAGNOSIS — I4821 Permanent atrial fibrillation: Secondary | ICD-10-CM | POA: Diagnosis not present

## 2019-10-19 DIAGNOSIS — Z853 Personal history of malignant neoplasm of breast: Secondary | ICD-10-CM | POA: Diagnosis not present

## 2019-10-19 DIAGNOSIS — Z7984 Long term (current) use of oral hypoglycemic drugs: Secondary | ICD-10-CM | POA: Insufficient documentation

## 2019-10-19 DIAGNOSIS — M25532 Pain in left wrist: Secondary | ICD-10-CM | POA: Diagnosis present

## 2019-10-19 DIAGNOSIS — M79645 Pain in left finger(s): Secondary | ICD-10-CM

## 2019-10-19 DIAGNOSIS — S52602B Unspecified fracture of lower end of left ulna, initial encounter for open fracture type I or II: Secondary | ICD-10-CM | POA: Insufficient documentation

## 2019-10-19 DIAGNOSIS — M1990 Unspecified osteoarthritis, unspecified site: Secondary | ICD-10-CM | POA: Diagnosis not present

## 2019-10-19 DIAGNOSIS — S5292XB Unspecified fracture of left forearm, initial encounter for open fracture type I or II: Secondary | ICD-10-CM | POA: Diagnosis present

## 2019-10-19 DIAGNOSIS — F419 Anxiety disorder, unspecified: Secondary | ICD-10-CM | POA: Diagnosis not present

## 2019-10-19 DIAGNOSIS — E785 Hyperlipidemia, unspecified: Secondary | ICD-10-CM | POA: Diagnosis not present

## 2019-10-19 DIAGNOSIS — Z419 Encounter for procedure for purposes other than remedying health state, unspecified: Secondary | ICD-10-CM

## 2019-10-19 DIAGNOSIS — W19XXXA Unspecified fall, initial encounter: Secondary | ICD-10-CM

## 2019-10-19 DIAGNOSIS — S62102B Fracture of unspecified carpal bone, left wrist, initial encounter for open fracture: Secondary | ICD-10-CM

## 2019-10-19 HISTORY — PX: ORIF ULNAR FRACTURE: SHX5417

## 2019-10-19 LAB — CBC WITH DIFFERENTIAL/PLATELET
Abs Immature Granulocytes: 0.02 10*3/uL (ref 0.00–0.07)
Basophils Absolute: 0 10*3/uL (ref 0.0–0.1)
Basophils Relative: 1 %
Eosinophils Absolute: 0 10*3/uL (ref 0.0–0.5)
Eosinophils Relative: 1 %
HCT: 45.3 % (ref 36.0–46.0)
Hemoglobin: 14.7 g/dL (ref 12.0–15.0)
Immature Granulocytes: 0 %
Lymphocytes Relative: 13 %
Lymphs Abs: 0.8 10*3/uL (ref 0.7–4.0)
MCH: 31.5 pg (ref 26.0–34.0)
MCHC: 32.5 g/dL (ref 30.0–36.0)
MCV: 97.2 fL (ref 80.0–100.0)
Monocytes Absolute: 0.5 10*3/uL (ref 0.1–1.0)
Monocytes Relative: 8 %
Neutro Abs: 4.7 10*3/uL (ref 1.7–7.7)
Neutrophils Relative %: 77 %
Platelets: 206 10*3/uL (ref 150–400)
RBC: 4.66 MIL/uL (ref 3.87–5.11)
RDW: 13.6 % (ref 11.5–15.5)
WBC: 6 10*3/uL (ref 4.0–10.5)
nRBC: 0 % (ref 0.0–0.2)

## 2019-10-19 LAB — BASIC METABOLIC PANEL
Anion gap: 12 (ref 5–15)
BUN: 16 mg/dL (ref 8–23)
CO2: 30 mmol/L (ref 22–32)
Calcium: 9.9 mg/dL (ref 8.9–10.3)
Chloride: 95 mmol/L — ABNORMAL LOW (ref 98–111)
Creatinine, Ser: 0.82 mg/dL (ref 0.44–1.00)
GFR calc Af Amer: >60 mL/min (ref >60)
GFR calc non Af Amer: >60 mL/min (ref >60)
Glucose, Bld: 212 mg/dL — ABNORMAL HIGH (ref 70–99)
Potassium: 4 mmol/L (ref 3.5–5.1)
Sodium: 137 mmol/L (ref 135–145)

## 2019-10-19 LAB — GLUCOSE, CAPILLARY: Glucose-Capillary: 200 mg/dL — ABNORMAL HIGH (ref 70–99)

## 2019-10-19 LAB — SARS CORONAVIRUS 2 BY RT PCR (HOSPITAL ORDER, PERFORMED IN ~~LOC~~ HOSPITAL LAB): SARS Coronavirus 2: NEGATIVE

## 2019-10-19 SURGERY — OPEN REDUCTION INTERNAL FIXATION (ORIF) ULNAR FRACTURE
Anesthesia: Monitor Anesthesia Care | Laterality: Left

## 2019-10-19 MED ORDER — MAGNESIUM OXIDE 400 (241.3 MG) MG PO TABS
200.0000 mg | ORAL_TABLET | Freq: Every day | ORAL | Status: DC
  Administered 2019-10-20 – 2019-10-21 (×2): 200 mg via ORAL
  Filled 2019-10-19 (×2): qty 1

## 2019-10-19 MED ORDER — FENTANYL CITRATE (PF) 250 MCG/5ML IJ SOLN
INTRAMUSCULAR | Status: DC | PRN
  Administered 2019-10-19: 50 ug via INTRAVENOUS

## 2019-10-19 MED ORDER — PROPOFOL 500 MG/50ML IV EMUL
INTRAVENOUS | Status: DC | PRN
  Administered 2019-10-19: 75 ug/kg/min via INTRAVENOUS

## 2019-10-19 MED ORDER — FUROSEMIDE 40 MG PO TABS
40.0000 mg | ORAL_TABLET | Freq: Every day | ORAL | Status: DC
  Administered 2019-10-20 – 2019-10-21 (×2): 40 mg via ORAL
  Filled 2019-10-19 (×2): qty 1

## 2019-10-19 MED ORDER — CEFAZOLIN SODIUM-DEXTROSE 1-4 GM/50ML-% IV SOLN
INTRAVENOUS | Status: DC | PRN
  Administered 2019-10-19: 1 g via INTRAVENOUS

## 2019-10-19 MED ORDER — ONDANSETRON HCL 4 MG/2ML IJ SOLN
INTRAMUSCULAR | Status: DC | PRN
  Administered 2019-10-19: 4 mg via INTRAVENOUS

## 2019-10-19 MED ORDER — AMISULPRIDE (ANTIEMETIC) 5 MG/2ML IV SOLN
5.0000 mg | Freq: Once | INTRAVENOUS | Status: AC
  Administered 2019-10-19: 5 mg via INTRAVENOUS

## 2019-10-19 MED ORDER — ACETAMINOPHEN 325 MG PO TABS
650.0000 mg | ORAL_TABLET | Freq: Four times a day (QID) | ORAL | Status: DC
  Administered 2019-10-20 – 2019-10-21 (×6): 650 mg via ORAL
  Filled 2019-10-19 (×6): qty 2

## 2019-10-19 MED ORDER — FENTANYL CITRATE (PF) 100 MCG/2ML IJ SOLN
50.0000 ug | Freq: Once | INTRAMUSCULAR | Status: AC
  Administered 2019-10-19: 50 ug via INTRAVENOUS
  Filled 2019-10-19: qty 2

## 2019-10-19 MED ORDER — DOCUSATE SODIUM 100 MG PO CAPS
100.0000 mg | ORAL_CAPSULE | Freq: Two times a day (BID) | ORAL | Status: DC
  Administered 2019-10-20 – 2019-10-21 (×4): 100 mg via ORAL
  Filled 2019-10-19 (×4): qty 1

## 2019-10-19 MED ORDER — LACTATED RINGERS IV SOLN: INTRAVENOUS | Status: DC

## 2019-10-19 MED ORDER — VITAMIN D 25 MCG (1000 UNIT) PO TABS
1000.0000 [IU] | ORAL_TABLET | Freq: Every day | ORAL | Status: DC
  Administered 2019-10-20 – 2019-10-21 (×2): 1000 [IU] via ORAL
  Filled 2019-10-19 (×2): qty 1

## 2019-10-19 MED ORDER — PROPOFOL 10 MG/ML IV BOLUS
INTRAVENOUS | Status: DC | PRN
  Administered 2019-10-19: 50 mg via INTRAVENOUS
  Administered 2019-10-19: 25 mg via INTRAVENOUS

## 2019-10-19 MED ORDER — CHLORHEXIDINE GLUCONATE 0.12 % MT SOLN: 15.0000 mL | Freq: Once | OROMUCOSAL | Status: AC

## 2019-10-19 MED ORDER — 0.9 % SODIUM CHLORIDE (POUR BTL) OPTIME
TOPICAL | Status: DC | PRN
  Administered 2019-10-19: 1000 mL

## 2019-10-19 MED ORDER — METFORMIN HCL ER 500 MG PO TB24
1000.0000 mg | ORAL_TABLET | Freq: Every evening | ORAL | Status: DC
  Administered 2019-10-20 – 2019-10-21 (×2): 1000 mg via ORAL
  Filled 2019-10-19 (×2): qty 2

## 2019-10-19 MED ORDER — LACTATED RINGERS IV SOLN: INTRAVENOUS | Status: DC | PRN

## 2019-10-19 MED ORDER — ACETAMINOPHEN 500 MG PO TABS: 1000.0000 mg | ORAL_TABLET | Freq: Once | ORAL | Status: AC

## 2019-10-19 MED ORDER — ROPIVACAINE HCL 5 MG/ML IJ SOLN
INTRAMUSCULAR | Status: DC | PRN
  Administered 2019-10-19: 25 mL via PERINEURAL

## 2019-10-19 MED ORDER — TETANUS-DIPHTH-ACELL PERTUSSIS 5-2.5-18.5 LF-MCG/0.5 IM SUSP
0.5000 mL | Freq: Once | INTRAMUSCULAR | Status: AC
  Administered 2019-10-19: 0.5 mL via INTRAMUSCULAR
  Filled 2019-10-19: qty 0.5

## 2019-10-19 MED ORDER — DILTIAZEM HCL ER COATED BEADS 180 MG PO CP24
300.0000 mg | ORAL_CAPSULE | Freq: Every evening | ORAL | Status: DC
  Administered 2019-10-20 – 2019-10-21 (×2): 300 mg via ORAL
  Filled 2019-10-19 (×2): qty 1

## 2019-10-19 MED ORDER — FENTANYL CITRATE (PF) 250 MCG/5ML IJ SOLN
INTRAMUSCULAR | Status: AC
  Filled 2019-10-19: qty 5

## 2019-10-19 MED ORDER — LIDOCAINE 2% (20 MG/ML) 5 ML SYRINGE
INTRAMUSCULAR | Status: DC | PRN
  Administered 2019-10-19: 60 mg via INTRAVENOUS

## 2019-10-19 MED ORDER — CEFAZOLIN SODIUM-DEXTROSE 2-4 GM/100ML-% IV SOLN
2.0000 g | INTRAVENOUS | Status: AC
  Administered 2019-10-19: 2 g via INTRAVENOUS
  Filled 2019-10-19: qty 100

## 2019-10-19 MED ORDER — LIDOCAINE HCL (PF) 1 % IJ SOLN
30.0000 mL | Freq: Once | INTRAMUSCULAR | Status: AC
  Administered 2019-10-19: 30 mL
  Filled 2019-10-19: qty 30

## 2019-10-19 MED ORDER — HYDROCHLOROTHIAZIDE 12.5 MG PO CAPS
12.5000 mg | ORAL_CAPSULE | Freq: Every day | ORAL | Status: DC
  Administered 2019-10-20 – 2019-10-21 (×2): 12.5 mg via ORAL
  Filled 2019-10-19 (×2): qty 1

## 2019-10-19 MED ORDER — POTASSIUM CHLORIDE CRYS ER 10 MEQ PO TBCR
10.0000 meq | EXTENDED_RELEASE_TABLET | Freq: Every day | ORAL | Status: DC
  Administered 2019-10-20 – 2019-10-21 (×2): 10 meq via ORAL
  Filled 2019-10-19 (×2): qty 1

## 2019-10-19 MED ORDER — CEFAZOLIN SODIUM-DEXTROSE 1-4 GM/50ML-% IV SOLN
1.0000 g | Freq: Four times a day (QID) | INTRAVENOUS | Status: AC
  Administered 2019-10-20 (×3): 1 g via INTRAVENOUS
  Filled 2019-10-19 (×3): qty 50

## 2019-10-19 MED ORDER — LOSARTAN POTASSIUM-HCTZ 100-12.5 MG PO TABS: 1.0000 | ORAL_TABLET | ORAL | Status: DC

## 2019-10-19 MED ORDER — METOPROLOL TARTRATE 25 MG PO TABS
25.0000 mg | ORAL_TABLET | Freq: Once | ORAL | Status: AC
  Administered 2019-10-20: 25 mg via ORAL
  Filled 2019-10-19: qty 1

## 2019-10-19 MED ORDER — FENTANYL CITRATE (PF) 100 MCG/2ML IJ SOLN
INTRAMUSCULAR | Status: AC
  Filled 2019-10-19: qty 2

## 2019-10-19 MED ORDER — METOPROLOL TARTRATE 25 MG PO TABS
25.0000 mg | ORAL_TABLET | Freq: Two times a day (BID) | ORAL | Status: DC
  Administered 2019-10-20 – 2019-10-21 (×3): 25 mg via ORAL
  Filled 2019-10-19 (×3): qty 1

## 2019-10-19 MED ORDER — TRAMADOL HCL 50 MG PO TABS
50.0000 mg | ORAL_TABLET | Freq: Four times a day (QID) | ORAL | Status: DC | PRN
  Administered 2019-10-20 – 2019-10-21 (×2): 50 mg via ORAL
  Filled 2019-10-19 (×3): qty 1

## 2019-10-19 MED ORDER — TEMAZEPAM 7.5 MG PO CAPS
30.0000 mg | ORAL_CAPSULE | Freq: Every evening | ORAL | Status: DC | PRN
  Administered 2019-10-20: 30 mg via ORAL
  Filled 2019-10-19 (×2): qty 4

## 2019-10-19 MED ORDER — ACETAMINOPHEN 500 MG PO TABS
ORAL_TABLET | ORAL | Status: AC
  Administered 2019-10-19: 1000 mg via ORAL
  Filled 2019-10-19: qty 2

## 2019-10-19 MED ORDER — CHLORHEXIDINE GLUCONATE 0.12 % MT SOLN
OROMUCOSAL | Status: AC
  Administered 2019-10-19: 15 mL via OROMUCOSAL
  Filled 2019-10-19: qty 15

## 2019-10-19 MED ORDER — CALCIUM CARBONATE-VITAMIN D 500-200 MG-UNIT PO TABS
1.0000 | ORAL_TABLET | Freq: Every day | ORAL | Status: DC
  Administered 2019-10-20 – 2019-10-21 (×2): 1 via ORAL
  Filled 2019-10-19 (×2): qty 1

## 2019-10-19 MED ORDER — DIGOXIN 125 MCG PO TABS
0.0625 mg | ORAL_TABLET | Freq: Every day | ORAL | Status: DC
  Administered 2019-10-20 – 2019-10-21 (×2): 0.0625 mg via ORAL
  Filled 2019-10-19 (×2): qty 1

## 2019-10-19 MED ORDER — PHENYLEPHRINE HCL-NACL 10-0.9 MG/250ML-% IV SOLN
INTRAVENOUS | Status: DC | PRN
  Administered 2019-10-19: 15 ug/min via INTRAVENOUS

## 2019-10-19 MED ORDER — AMISULPRIDE (ANTIEMETIC) 5 MG/2ML IV SOLN
INTRAVENOUS | Status: AC
  Filled 2019-10-19: qty 2

## 2019-10-19 MED ORDER — LOSARTAN POTASSIUM 50 MG PO TABS
100.0000 mg | ORAL_TABLET | Freq: Every day | ORAL | Status: DC
  Administered 2019-10-20 – 2019-10-21 (×2): 100 mg via ORAL
  Filled 2019-10-19 (×2): qty 2

## 2019-10-19 SURGICAL SUPPLY — 59 items
ADAPTER GUIDE FAST 1.6 (MISCELLANEOUS) ×1 IMPLANT
ADPR INSRT FST GD (MISCELLANEOUS) ×1
APL PRP STRL LF DISP 70% ISPRP (MISCELLANEOUS) ×1
BAND INSRT 18 STRL LF DISP RB (MISCELLANEOUS)
BAND RUBBER #18 3X1/16 STRL (MISCELLANEOUS) IMPLANT
BIT DRILL 2.5X2.75 QC CALB (BIT) ×1 IMPLANT
BIT DRILL 2X127 MARKED (BIT) ×1 IMPLANT
BIT DRILL CALIBRATED 2.7 (BIT) ×1 IMPLANT
BLADE MINI RND TIP GREEN BEAV (BLADE) IMPLANT
BLADE SURG 15 STRL LF DISP TIS (BLADE) ×1 IMPLANT
BLADE SURG 15 STRL SS (BLADE) ×2
BNDG CMPR 9X4 STRL LF SNTH (GAUZE/BANDAGES/DRESSINGS) ×1
BNDG COHESIVE 2X5 TAN STRL LF (GAUZE/BANDAGES/DRESSINGS) IMPLANT
BNDG COHESIVE 4X5 TAN STRL (GAUZE/BANDAGES/DRESSINGS) ×2 IMPLANT
BNDG ELASTIC 4X5.8 VLCR STR LF (GAUZE/BANDAGES/DRESSINGS) ×1 IMPLANT
BNDG ESMARK 4X9 LF (GAUZE/BANDAGES/DRESSINGS) ×2 IMPLANT
BNDG GAUZE ELAST 4 BULKY (GAUZE/BANDAGES/DRESSINGS) ×3 IMPLANT
BRUSH SCRUB EZ PLAIN DRY (MISCELLANEOUS) IMPLANT
CANISTER SUCT 3000ML PPV (MISCELLANEOUS) ×2 IMPLANT
CHLORAPREP W/TINT 26 (MISCELLANEOUS) ×2 IMPLANT
CORD BIPOLAR FORCEPS 12FT (ELECTRODE) ×2 IMPLANT
COVER BACK TABLE 60X90IN (DRAPES) ×2 IMPLANT
COVER WAND RF STERILE (DRAPES) ×2 IMPLANT
CUFF TOURN SGL QUICK 18X4 (TOURNIQUET CUFF) IMPLANT
CUFF TOURN SGL QUICK 24 (TOURNIQUET CUFF)
CUFF TRNQT CYL 24X4X16.5-23 (TOURNIQUET CUFF) IMPLANT
DRAPE C-ARM 42X72 X-RAY (DRAPES) ×2 IMPLANT
DRAPE SURG 17X23 STRL (DRAPES) ×2 IMPLANT
DRSG ADAPTIC 3X8 NADH LF (GAUZE/BANDAGES/DRESSINGS) ×2 IMPLANT
DRSG EMULSION OIL 3X3 NADH (GAUZE/BANDAGES/DRESSINGS) IMPLANT
GAUZE SPONGE 4X4 12PLY STRL (GAUZE/BANDAGES/DRESSINGS) ×2 IMPLANT
GLOVE BIO SURGEON STRL SZ7.5 (GLOVE) ×2 IMPLANT
GLOVE BIOGEL PI IND STRL 8 (GLOVE) ×1 IMPLANT
GLOVE BIOGEL PI INDICATOR 8 (GLOVE) ×1
GOWN STRL REUS W/ TWL XL LVL3 (GOWN DISPOSABLE) ×1 IMPLANT
GOWN STRL REUS W/TWL XL LVL3 (GOWN DISPOSABLE) ×2
K-WIRE ACE 1.6X6 (WIRE) ×6
KIT BASIN OR (CUSTOM PROCEDURE TRAY) ×2 IMPLANT
KWIRE ACE 1.6X6 (WIRE) IMPLANT
NDL HYPO 25X1 1.5 SAFETY (NEEDLE) IMPLANT
NEEDLE HYPO 25X1 1.5 SAFETY (NEEDLE) IMPLANT
NS IRRIG 1000ML POUR BTL (IV SOLUTION) ×2 IMPLANT
PACK ORTHO EXTREMITY (CUSTOM PROCEDURE TRAY) ×2 IMPLANT
PAD CAST 4YDX4 CTTN HI CHSV (CAST SUPPLIES) ×1 IMPLANT
PADDING CAST ABS 4INX4YD NS (CAST SUPPLIES)
PADDING CAST ABS COTTON 4X4 ST (CAST SUPPLIES) IMPLANT
PADDING CAST COTTON 4X4 STRL (CAST SUPPLIES) ×2
PENCIL BUTTON HOLSTER BLD 10FT (ELECTRODE) IMPLANT
PLATE LOCK 12H 164 BILAT FIB (Plate) ×1 IMPLANT
SCREW LOCK 2.7X10 FOOT (Screw) ×4 IMPLANT
SCREW LOCK CORT STAR 3.5X10 (Screw) ×3 IMPLANT
SCREW LOCK CORT STAR 3.5X12 (Screw) ×1 IMPLANT
SCREW LOW PROFILE 18MMX3.5MM (Screw) ×1 IMPLANT
SUT VIC AB 2-0 CT3 27 (SUTURE) ×2 IMPLANT
SUT VICRYL 4-0 PS2 18IN ABS (SUTURE) IMPLANT
SUT VICRYL RAPIDE 4/0 PS 2 (SUTURE) ×2 IMPLANT
SYR 10ML LL (SYRINGE) IMPLANT
TOWEL GREEN STERILE FF (TOWEL DISPOSABLE) ×2 IMPLANT
UNDERPAD 30X36 HEAVY ABSORB (UNDERPADS AND DIAPERS) ×2 IMPLANT

## 2019-10-19 NOTE — Progress Notes (Signed)
Attempted to call son Tara Bradley x 2 on cell phone [phone].  No answer and unable to leave VM.  Checked surgical waiting area and son was not there.

## 2019-10-19 NOTE — Consult Note (Signed)
ORTHOPAEDIC CONSULTATION HISTORY & PHYSICAL REQUESTING PHYSICIAN: Virgel Manifold, MD  Chief Complaint: Left wrist deformity and wound  HPI: Tara Bradley is a 86 y.o. female on Xarelto for A. fib who tripped and fell onto an outstretched left hand.  She sustained immediate deformity, with an ulnar-sided wound that has hemorrhaged.  The deformity is fairly marked.  She presented to the emergency department for further evaluation.  She denies loss of consciousness, dizziness, or chest pain.  In the emergency department she was accompanied by her son.  She has a history of bilateral wrist fractures.  Past Medical History:  Diagnosis Date  . Aortic valve sclerosis    Echo, 2008  . Arthritis   . Bradycardia    October, 2012  . Cancer ([place])   . Chronic insomnia   . Closed fracture of unspecified part of femur 2005  . Diabetes mellitus, type 2 ([place])   . Diverticulitis   . Diverticulosis   . Ejection fraction    EF 60%, echo, February, 2008  //   EF 65-70%, echo, November, 2012  . Hyperlipidemia   . Hypertension   . Long term (current) use of anticoagulants   . Osteoporosis    femur fracture 2005, pelvic fracture 2006  . Persistent atrial fibrillation ([place])   . Personal history of malignant neoplasm of breast   . Syncope   . Thoracic aortic aneurysm ([place]) 02/2017  . Unspecified closed fracture of pelvis 2006   Past Surgical History:  Procedure Laterality Date  . CARDIOVERSION N/A 08/09/2012   Procedure: CARDIOVERSION;  Surgeon: Deboraha Sprang, MD;  Location: [facility];  Service: Cardiovascular;  Laterality: N/A;  . EYE SURGERY    . FEMUR SURGERY  2005   ORIF  . IR ANGIOGRAM PELVIS SELECTIVE OR SUPRASELECTIVE  03/11/2017  . IR ANGIOGRAM SELECTIVE EACH ADDITIONAL VESSEL  03/11/2017  . IR EMBO ART  VEN HEMORR LYMPH EXTRAV  INC GUIDE ROADMAPPING  03/11/2017  . IR FLUORO GUIDE CV LINE RIGHT  03/11/2017  . IR US GUIDE VASC ACCESS RIGHT  03/11/2017  . IR US GUIDE VASC ACCESS  RIGHT  03/11/2017  . MASTECTOMY  1995   left  . SHOULDER SURGERY     left  . [place], 2003  . TONSILLECTOMY    . TOTAL ABDOMINAL HYSTERECTOMY W/ BILATERAL SALPINGOOPHORECTOMY  1995  . WRIST SURGERY      x 2    Social History   Socioeconomic History  . Marital status: Married    Spouse name: Not on file  . Number of children: 2  . Years of education: Not on file  . Highest education level: Not on file  Occupational History  . Occupation: English as a second language teacher, retired  Tobacco Use  . Smoking status: Never Smoker  . Smokeless tobacco: Never Used  Substance and Sexual Activity  . Alcohol use: No  . Drug use: No  . Sexual activity: Not Currently  Other Topics Concern  . Not on file  Social History Narrative   Husband had advancing Dementia, which was been very difficult for her. He died Jan 11, 2017.   She was scheduled to go into [facility] 11/02.   Social Determinants of Health   Financial Resource Strain:   . Difficulty of Paying Living Expenses:   Food Insecurity:   . Worried About Charity fundraiser in the Last Year:   . Arboriculturist in the Last Year:   Transportation Needs:   .  Lack of Transportation (Medical):   [name] Lack of Transportation (Non-Medical):   Physical Activity:   . Days of Exercise per Week:   . Minutes of Exercise per Session:   Stress:   . Feeling of Stress :   Social Connections:   . Frequency of Communication with Friends and Family:   . Frequency of Social Gatherings with Friends and Family:   . Attends Religious Services:   . Active Member of Clubs or Organizations:   . Attends Archivist Meetings:   [name] Marital Status:    Family History  Problem Relation Age of Onset  . Congestive Heart Failure Mother 36  . Heart attack Father 61  . Diabetes type II Father   . Diabetes type II Brother        1/2  . Multiple myeloma Brother        2/2  . Diabetes type II Son   . Colon cancer Neg Hx    Allergies    Allergen Reactions  . Ambien [Zolpidem Tartrate]     Hallucinations   . Codeine Other (See Comments)    headache  . Codeine Sulfate     REACTION: unspecified   Prior to Admission medications   Medication Sig Start Date End Date Taking? Authorizing Provider  acetaminophen (TYLENOL) 325 MG tablet Take 2 tablets (650 mg total) by mouth every 4 (four) hours as needed for mild pain. 03/18/17   Meuth, Blaine Hamper, PA-C  Ascorbic Acid (VITAMIN C PO) Take 1 tablet by mouth daily.    [provider]  B Complex-C (B-COMPLEX WITH VITAMIN C) tablet Take 1 tablet by mouth daily.    [provider]  Calcium Carbonate-Vitamin D (CALTRATE 600+D) 600-400 MG-UNIT per tablet Take 1 tablet by mouth daily.      [provider]  Cholecalciferol (VITAMIN D) 2000 UNITS tablet Take 1,000 Units by mouth daily.     [provider]  digoxin (LANOXIN) 0.125 MG tablet TAKE (1/2) TABLET DAILY. 07/03/19   [name], MD  diltiazem (CARDIZEM CD) 300 MG 24 hr capsule Take 1 capsule (300 mg total) by mouth daily. 04/30/19   [name], MD  docusate sodium (COLACE) 100 MG capsule Take 1 capsule (100 mg total) by mouth 2 (two) times daily. 10/10/16   Mikhail, Velta Addison, DO  furosemide (LASIX) 40 MG tablet Take 20 mg by mouth daily.    [provider]  insulin lispro (HUMALOG) 100 UNIT/ML injection Inject 8 Units into the skin once.     [provider]  losartan-hydrochlorothiazide (HYZAAR) 100-12.5 MG tablet Take 1 tablet by mouth every morning. 09/27/17   [provider]  Magnesium 250 MG TABS Take 1 tablet by mouth daily.      [provider]  metFORMIN (GLUMETZA) 500 MG (MOD) 24 hr tablet Take 1,000 mg by mouth every evening.    [provider]  metoprolol tartrate (LOPRESSOR) 25 MG tablet TAKE 1 TABLET BY MOUTH TWICE DAILY. 09/06/19   [name], MD  potassium chloride (K-DUR) 10 MEQ tablet Take 10 mEq by mouth daily.  03/25/15   [provider]  temazepam (RESTORIL) 30 MG capsule TAKE ONE CAPSULE(30 MG)  AT BEDTIME AS NEEDED FOR SLEEP 03/31/15   [provider]  XARELTO 15 MG TABS tablet TAKE 1 TABLET ONCE DAILY WITH SUPPER. 09/06/19   [name], MD   DG Wrist Complete Left  Result Date: 10/19/2019 CLINICAL DATA:  Pain following fall EXAM: LEFT WRIST -  COMPLETE 3+ VIEW COMPARISON:  None. FINDINGS: Frontal, oblique, and lateral views were obtained. Bones are diffusely osteoporotic. There is a fracture at the distal radial metaphysis-diaphysis junction with marked volar displacement and angulation distally. There is approximately 2.5 cm of overriding of fracture fragments. There is an obliquely oriented fracture of the distal ulnar diaphysis with volar angulation and displacement distally. There is approximately 1.5 cm of overriding of fracture fragments. No other fractures are evident. There is underlying osteoporosis. There is calcification overlying the hamate bone which may represent residua of prior trauma. This area appears well corticated. There is narrowing of the radiocarpal joint. There appears to be widening between the scaphoid and lunate bones. IMPRESSION: Diffuse osteoporosis. Fractures of the distal radius and ulna with volar displacement and volar angulation of the distal fracture fragments with respect proximal fragment. There is overriding of fracture fragments. There is questionable scapholunate disassociation. Calcification overlying the hamate bone is of uncertain etiology and may represent residua of prior trauma. Electronically Signed   By: Lowella Grip III M.D.   On: 10/19/2019 16:49    Positive ROS: All other systems have been reviewed and were otherwise negative with the exception of those mentioned in the HPI and as above.  Physical Exam: Vitals: Refer to EMR. Constitutional:  WD, WN, NAD HEENT:  NCAT, EOMI Neuro/Psych:  Alert & oriented to person, place, and time; appropriate mood &  affect Lymphatic: No generalized extremity edema or lymphadenopathy Extremities / MSK:  The extremities are normal with respect to appearance, ranges of motion, joint stability, muscle strength/tone, sensation, & perfusion except as otherwise noted:  The left wrist is provisionally splinted.  There is marked radial angulation.  There is an ulnar-sided wound with evidence of prior hemorrhage.  Intact but altered light touch sensibility throughout all the fingertips and thumb.  Movement is minimal, but I think sufficient to establish intact motor to the median and ulnar nerves.  Digits are well-perfused.  Assessment: Markedly displaced left distal radius and ulnar fractures, with a small open wound ulnarly at the level of the fracture site, presumably a grade 1 open fracture  Plan: I discussed these findings with her and her son.  I recommended operative treatment with bridge plate to obtain more anatomic alignment and stability, in addition to addressing the open wound.  I did discuss that oftentimes the bridge plate is removed after the fracture has healed.  Goals, risk, and options were reviewed and consent obtained.  The consent was executed.  We will proceed when operative resources are available.  Rayvon Char [address], [facility], [place]  [address] Office: [phone] Mobile: [phone]  10/19/2019, 5:16 PM
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
## 2019-10-19 NOTE — Transfer of Care (Signed)
Immediate Anesthesia Transfer of Care Note  Patient: Tara Bradley  Procedure(s) Performed: OPEN REDUCTION INTERNAL FIXATION (ORIF)  LEFT DISTAL RADIUS  AND ULNA FRACTURE (Left )  Patient Location: PACU  Anesthesia Type:MAC and Regional  Level of Consciousness: awake, alert  and oriented  Airway & Oxygen Therapy: Patient Spontanous Breathing and Patient connected to nasal cannula oxygen  Post-op Assessment: Report given to RN and Post -op Vital signs reviewed and stable  Post vital signs: Reviewed and stable  Last Vitals:  Vitals Value Taken Time  BP 134/98 10/19/19 2234  Temp    Pulse 126 10/19/19 2235  Resp 15 10/19/19 2235  SpO2 97 % 10/19/19 2235  Vitals shown include unvalidated device data.  Last Pain:  Vitals:   10/19/19 1814  TempSrc:   PainSc: 9          Complications: No apparent anesthesia complications

## 2019-10-19 NOTE — Anesthesia Procedure Notes (Signed)
Procedure Name: MAC Date/Time: 10/19/2019 8:37 PM Performed by: Jearld Pies, CRNA Pre-anesthesia Checklist: Patient identified, Emergency Drugs available, Suction available, Patient being monitored and Timeout performed Patient Re-evaluated:Patient Re-evaluated prior to induction Oxygen Delivery Method: Nasal cannula

## 2019-10-19 NOTE — ED Provider Notes (Signed)
Medical screening examination/treatment/procedure(s) were conducted as a shared visit with non-physician practitioner(s) and myself.  I personally evaluated the patient during the encounter.   89yF with distal L wrist fracture. Mechanical. Tripped over dog. Open fracture. Wound is over the site of fracture and lidocaine noted to leak out of it when doing hematoma block. Abx. Will discuss with hand surgery.    Hematoma Block:   Consent: verbal consent obtained Indication: Pain Relief, Orthopedic Manipulation Medications: 7cc 1% lidocaine w/o epinephrine   Skin overlying hematoma cleansed in typical fashion. 27g needle used. Hematoma entered over dorsal aspect of wrist and directed towards fracture site. Dark red blood aspirated prior to injecting. Pt tolerated procedure well w/o any apparent complication. Lidocaine noted to leak out of nearby laceration during injection.      Virgel Manifold, MD 10/19/19 [phone]

## 2019-10-19 NOTE — Anesthesia Procedure Notes (Signed)
Anesthesia Regional Block: Supraclavicular block   Pre-Anesthetic Checklist: ,, timeout performed, Correct Patient, Correct Site, Correct Laterality, Correct Procedure, Correct Position, site marked, Risks and benefits discussed,  Surgical consent,  Pre-op evaluation,  At surgeon's request and post-op pain management  Laterality: Left  Prep: chloraprep       Needles:  Injection technique: Single-shot  Needle Type: Echogenic Stimulator Needle     Needle Length: 5cm  Needle Gauge: 22     Additional Needles:   Narrative:  Start time: 10/19/2019 7:53 PM End time: 10/19/2019 8:06 PM Injection made incrementally with aspirations every 5 mL.  Performed by: Personally  Anesthesiologist: Duane Boston, MD  Additional Notes: Functioning IV was confirmed and monitors applied.  A 73mm 22ga echogenic arrow stimulator was used. Sterile prep and drape,hand hygiene and sterile gloves were used.Ultrasound guidance: relevant anatomy identified, needle position confirmed, local anesthetic spread visualized around nerve(s)., vascular puncture avoided.  Image printed for medical record.  Negative aspiration and negative test dose prior to incremental administration of local anesthetic. The patient tolerated the procedure well.

## 2019-10-19 NOTE — Op Note (Signed)
10/19/2019  10:37 PM  PATIENT:  Tara Bradley  86 y.o. female  PRE-OPERATIVE DIAGNOSIS: Comminuted displaced left distal radius and ulnar fracture, possibly open grade 1  POST-OPERATIVE DIAGNOSIS:  Same, likely open grade 1  PROCEDURE: Open treatment of left distal radius and ulna fractures with bridge plate technique                           Excisional debridement of skin and subcutaneous tissues associated with open fracture of left distal ulna                           Intermediate closure of traumatic wound associated with open ulnar fracture, 1.5 cm  SURGEON: Rayvon Char. Grandville Silos, MD  PHYSICIAN ASSISTANT: Morley Kos, OPA-C  ANESTHESIA:  regional and MAC  SPECIMENS:  None  DRAINS:   None  EBL:  less than 50 mL  PREOPERATIVE INDICATIONS:  Tara Bradley is a  86 y.o. female with a comminuted markedly displaced left distal radius and ulna fracture, with an open wound associated with the ulna fracture.  The risks benefits and alternatives were discussed with the patient preoperatively including but not limited to the risks of infection, bleeding, nerve injury, cardiopulmonary complications, the need for revision surgery, among others, and the patient verbalized understanding and consented to proceed.  OPERATIVE IMPLANTS: Biomet Combi plate, 2.7 millimeter screws distally, 3.5 proximally  OPERATIVE PROCEDURE:  After receiving prophylactic antibiotics and a regional block, the patient was escorted to the operative theatre and placed in a supine position.  A surgical "time-out" was performed during which the planned procedure, proposed operative site, and the correct patient identity were compared to the operative consent and agreement confirmed by the circulating nurse according to current facility policy.  Following application of a tourniquet to the operative extremity, the exposed skin was prepped with Chloraprep and draped in the usual sterile fashion.  The limb was  exsanguinated with an Esmarch bandage and the tourniquet inflated to approximately 124mHg higher than systolic BP.  The open wound at the ulna was elliptically excised and copiously irrigated.  The excision included skin and subcutaneous tissues using knife and scissors.  Once satisfied with the debridement and irrigation, attention was shifted to reduction and stabilization of the fracture.  Provisional reduction was attempted and obtained, but not stable.  Linear longitudinal incision was made over the third metacarpal as well as over the distal radial diaphysis.  Spreading dissection was carried down distally so the tendons could be retracted and gain access to the dorsal surface of the third metacarpal.  An elevator was then used to help make a path for the plate.  It was advanced from distal to proximal once, and provisionally applied when it was determined that the EIP was caught within it.  It was then removed to allow for placement that did not affect the extensor tendons.  Ultimately this was done and proximal holes filled with locking screws, the distal with 2.7 mm locking screws with an effort to obtain the best length and reduction of the fracture through the bridge technique.  The plate had actually been contoured with slight extension at the level of the carpus.  After the bridge plate was applied with the appropriate degree of distraction to gain length, the ulna fracture actually lined up quite well and rotation was smooth.  Decision was made not to independently provide fixation for the  ulna fracture.  Final images were obtained.  Both incisions were closed with 4-0 Vicryl Rapide interrupted sutures and the traumatic wound, measuring 1.5 cm, was reapproximated with 4-0 Vicryl Rapide interrupted suture.  A sugar tong bulky splint dressing was applied and she was taken to recovery room in stable condition  DISPOSITION: She will be transferred to the floor for overnight care, receiving some  additional antibiotics and analgesics, as well as observation for excessive hemorrhage, and possibly discharged on 5-29.

## 2019-10-19 NOTE — Progress Notes (Signed)
Orthopedic Tech Progress Note Patient Details:  Tara Bradley [birth year] [id number] Spoke with provider and patient will be having surgery on hand  Patient ID: [name], female   DOB: 1922/11/17, 86 y.o.   MRN: [id number]   Jearld Lesch 10/19/2019, 7:15 PM

## 2019-10-19 NOTE — Anesthesia Preprocedure Evaluation (Addendum)
Anesthesia Evaluation  Patient identified by MRN, date of birth, ID band Patient awake    Reviewed: Allergy & Precautions, NPO status , Patient's Chart, lab work & pertinent test results  History of Anesthesia Complications Negative for: history of anesthetic complications  Airway Mallampati: II  TM Distance: >3 FB Neck ROM: Full    Dental no notable dental hx. (+) Dental Advisory Given   Pulmonary neg pulmonary ROS,    Pulmonary exam normal        Cardiovascular hypertension, Pt. on home beta blockers and Pt. on medications +CHF  + dysrhythmias Atrial Fibrillation  Rhythm:Irregular Rate:Tachycardia  Impressions:   - The patient was in atrial fibrillation. Normal LV size with EF  45%, diffuse hypokinesis. Mildly dilated RV with mildly decreased  systolic function. Moderate tricuspid regurgitation. Moderate  pulmonary hypertension. Moderate biatrial enlargement.    Neuro/Psych PSYCHIATRIC DISORDERS Anxiety negative neurological ROS     GI/Hepatic negative GI ROS, Neg liver ROS,   Endo/Other  diabetes  Renal/GU negative Renal ROS     Musculoskeletal negative musculoskeletal ROS (+)   Abdominal   Peds  Hematology negative hematology ROS (+)   Anesthesia Other Findings   Reproductive/Obstetrics                           Anesthesia Physical Anesthesia Plan  ASA: III  Anesthesia Plan: MAC   Post-op Pain Management:  Regional for Post-op pain   Induction:   PONV Risk Score and Plan: 2 and Ondansetron, Propofol infusion and Treatment may vary due to age or medical condition  Airway Management Planned: Natural Airway  Additional Equipment:   Intra-op Plan:   Post-operative Plan:   Informed Consent: I have reviewed the patients History and Physical, chart, labs and discussed the procedure including the risks, benefits and alternatives for the proposed anesthesia with the  patient or authorized representative who has indicated his/her understanding and acceptance.     Dental advisory given  Plan Discussed with: CRNA, Anesthesiologist and Surgeon  Anesthesia Plan Comments:        Anesthesia Quick Evaluation

## 2019-10-19 NOTE — ED Notes (Signed)
Pt undressed completely  Jewelry removed and given to the son clothes bagged up                        bagged up

## 2019-10-19 NOTE — Anesthesia Postprocedure Evaluation (Signed)
Anesthesia Post Note  Patient: Tara Bradley  Procedure(s) Performed: OPEN REDUCTION INTERNAL FIXATION (ORIF)  LEFT DISTAL RADIUS  AND ULNA FRACTURE (Left )     Patient location during evaluation: PACU Anesthesia Type: MAC and Regional Level of consciousness: awake and alert Pain management: pain level controlled Vital Signs Assessment: post-procedure vital signs reviewed and stable Respiratory status: spontaneous breathing and respiratory function stable Cardiovascular status: stable Postop Assessment: no apparent nausea or vomiting Anesthetic complications: no    Last Vitals:  Vitals:   10/19/19 2245 10/19/19 2300  BP: (!) 146/98 (!) 151/93  Pulse: (!) 115 (!) 129  Resp: 16 16  Temp:    SpO2: 93% 98%    Last Pain:  Vitals:   10/19/19 2234  TempSrc:   PainSc: 0-No pain                 Loraina Stauffer DANIEL

## 2019-10-19 NOTE — ED Triage Notes (Addendum)
Arrived POV patient knocked over by a family dog. Obvious deformity left wrist with laceration. States also hit the back of her head. Denies LOC. States had previous surgery on left wrist. Ax4. Patient states takes Nutritional therapist for A-fib.

## 2019-10-20 ENCOUNTER — Ambulatory Visit (HOSPITAL_COMMUNITY): Payer: Medicare Other

## 2019-10-20 MED ORDER — ONDANSETRON HCL 4 MG PO TABS
4.0000 mg | ORAL_TABLET | Freq: Three times a day (TID) | ORAL | Status: DC | PRN
  Administered 2019-10-21: 4 mg via ORAL
  Filled 2019-10-20 (×2): qty 1

## 2019-10-20 MED ORDER — ACETAMINOPHEN 325 MG PO TABS: 650.0000 mg | ORAL_TABLET | Freq: Four times a day (QID) | ORAL | Status: DC

## 2019-10-20 MED ORDER — ONDANSETRON HCL 4 MG PO TABS: 4.0000 mg | ORAL_TABLET | Freq: Three times a day (TID) | ORAL | 0 refills | Status: DC | PRN

## 2019-10-20 MED ORDER — RIVAROXABAN 15 MG PO TABS: 15.0000 mg | ORAL_TABLET | Freq: Every day | ORAL | 0 refills | Status: DC

## 2019-10-20 NOTE — Discharge Summary (Addendum)
Physician Discharge Summary  Patient ID: Tara Bradley MRN: [id number] DOB/AGE: 06/02/1922 86 y.o.  Admit date: 10/19/2019 Discharge date: 10/20/2019  Admission Diagnoses:  Left forearm open fracture  Discharge Diagnoses:  Active Problems:   Left forearm fracture, open type I or II, initial encounter   Past Medical History:  Diagnosis Date  . Aortic valve sclerosis    Echo, 2008  . Arthritis   . Bradycardia    October, 2012  . Cancer ([place])   . Chronic insomnia   . Closed fracture of unspecified part of femur 2005  . Diabetes mellitus, type 2 ([place])   . Diverticulitis   . Diverticulosis   . Ejection fraction    EF 60%, echo, February, 2008  //   EF 65-70%, echo, November, 2012  . Hyperlipidemia   . Hypertension   . Long term (current) use of anticoagulants   . Osteoporosis    femur fracture 2005, pelvic fracture 2006  . Persistent atrial fibrillation ([place])   . Personal history of malignant neoplasm of breast   . Syncope   . Thoracic aortic aneurysm ([place]) 02/2017  . Unspecified closed fracture of pelvis 2006    Surgeries: Procedure(s): OPEN REDUCTION INTERNAL FIXATION (ORIF)  LEFT DISTAL RADIUS  AND ULNA FRACTURE on 10/19/2019--with bridge plate on Radius   Consultants (if any):   Discharged Condition: Improved  Hospital Course: Tara Bradley is an 86 y.o. female who was admitted 10/19/2019 with a diagnosis of open left distal BBFFx and went to the operating room on 10/19/2019 and underwent the above named procedures.  She tolerated the procedure well.  On morning of POD 1, she was examined sitting in chair, very conversant, reporting pain and mild numbness in left thumb.  Digits eccymotic and puffy, but can achieve full active F/E.  Intact LT sens R/M/U with intact motor to same.  Discussed plan to add zofran to try to tolerate Tramadol, and patient indicated she had tramadol already at home.  She has difficulty taking any narcotic pain meds due to side effects.  Hand  xrays are pending to evaluate for any hand fractures not visualized on wrist xrays, and will likely be d/c today after 1500 Ancef.--No worrisome hand findings noted on hand xray.  5-30 addendum:  Patient not able to be d/c home y'day due to inadequate help at home and uncertain ability to be safely mobile at home with walker use.  PT saw patient, and cleared for home with HHPT.  Patient to seek private assistance for care at home and we will try to set up home PT eval for safety.  I have ordered OT eval for today, and if all criterion are met, she can d/c home today, resuming Xarelto tonight.  She was given perioperative antibiotics:  Anti-infectives (From admission, onward)   Start     Dose/Rate Route Frequency Ordered Stop   10/20/19 0300  ceFAZolin (ANCEF) IVPB 1 g/50 mL premix     1 g 100 mL/hr over 30 Minutes Intravenous Every 6 hours 10/19/19 2344 10/20/19 2059   10/19/19 1645  ceFAZolin (ANCEF) IVPB 2g/100 mL premix     2 g 200 mL/hr over 30 Minutes Intravenous STAT 10/19/19 1640 10/19/19 1915    .  She was given sequential compression devices, early ambulation, and is on Xarelto already.  She benefited maximally from the hospital stay and there were no complications.     Recent vital signs:  Vitals:   10/20/19 0500 10/20/19 0759  BP: [name])  126/101 123/84  Pulse: 96 100  Resp: 16 18  Temp: 97.6 F (36.4 C) 98.7 F (37.1 C)  SpO2: 96% 95%    Recent laboratory studies:  Lab Results  Component Value Date   HGB 14.7 10/19/2019   HGB 14.2 03/09/2019   HGB 9.2 (L) 03/18/2017   Lab Results  Component Value Date   WBC 6.0 10/19/2019   PLT 206 10/19/2019   Lab Results  Component Value Date   INR 1.11 03/11/2017   Lab Results  Component Value Date   NA 137 10/19/2019   K 4.0 10/19/2019   CL 95 (L) 10/19/2019   CO2 30 10/19/2019   BUN 16 10/19/2019   CREATININE 0.82 10/19/2019   GLUCOSE 212 (H) 10/19/2019    Discharge Medications:     Diagnostic Studies: DG  Wrist 2 Views Left  Result Date: 10/19/2019 CLINICAL DATA:  Open reduction and internal fixation of the left distal radius and ulna fractures. EXAM: LEFT WRIST - 2 VIEW; DG C-ARM 1-60 MIN COMPARISON:  Oct 19, 2019 FINDINGS: The patient has undergone plate and screw fixation of the wrist. The hardware is intact. Again noted are fractures of the distal radius and ulna with improved osseous alignment. Two images were obtained. The total fluoroscopy time was 1 minutes and 4 seconds. IMPRESSION: Status post ORIF of the wrist with improved osseous alignment. Electronically Signed   By: Constance Holster M.D.   On: 10/19/2019 22:41   DG Wrist Complete Left  Result Date: 10/19/2019 CLINICAL DATA:  Pain following fall EXAM: LEFT WRIST - COMPLETE 3+ VIEW COMPARISON:  None. FINDINGS: Frontal, oblique, and lateral views were obtained. Bones are diffusely osteoporotic. There is a fracture at the distal radial metaphysis-diaphysis junction with marked volar displacement and angulation distally. There is approximately 2.5 cm of overriding of fracture fragments. There is an obliquely oriented fracture of the distal ulnar diaphysis with volar angulation and displacement distally. There is approximately 1.5 cm of overriding of fracture fragments. No other fractures are evident. There is underlying osteoporosis. There is calcification overlying the hamate bone which may represent residua of prior trauma. This area appears well corticated. There is narrowing of the radiocarpal joint. There appears to be widening between the scaphoid and lunate bones. IMPRESSION: Diffuse osteoporosis. Fractures of the distal radius and ulna with volar displacement and volar angulation of the distal fracture fragments with respect proximal fragment. There is overriding of fracture fragments. There is questionable scapholunate disassociation. Calcification overlying the hamate bone is of uncertain etiology and may represent residua of prior trauma.  Electronically Signed   By: Lowella Grip III M.D.   On: 10/19/2019 16:49   CT Head Wo Contrast  Result Date: 10/19/2019 CLINICAL DATA:  86 year old female with head trauma. EXAM: CT HEAD WITHOUT CONTRAST CT CERVICAL SPINE WITHOUT CONTRAST TECHNIQUE: Multidetector CT imaging of the head and cervical spine was performed following the standard protocol without intravenous contrast. Multiplanar CT image reconstructions of the cervical spine were also generated. COMPARISON:  Head CT dated 03/10/2017. FINDINGS: CT HEAD FINDINGS Brain: Moderate age-related atrophy and chronic microvascular ischemic changes. There is no acute intracranial hemorrhage. No mass effect or midline shift. No extra-axial fluid collection. Vascular: No hyperdense vessel or unexpected calcification. Skull: Normal. Negative for fracture or focal lesion. Sinuses/Orbits: No acute finding. Other: None CT CERVICAL SPINE FINDINGS Alignment: No acute subluxation. Skull base and vertebrae: No acute fracture. Osteopenia. Soft tissues and spinal canal: No prevertebral fluid or swelling. No visible canal hematoma.  Disc levels: Extensive multilevel degenerative changes with endplate irregularity and disc space narrowing. Upper chest: Negative. Other: Bilateral carotid bulb calcified plaques. Thyroid nodules. IMPRESSION: 1. No acute intracranial hemorrhage. Age-related atrophy and chronic microvascular ischemic changes. 2. No acute/traumatic cervical spine pathology. Extensive multilevel degenerative changes. Electronically Signed   By: Anner Crete M.D.   On: 10/19/2019 17:56   CT Cervical Spine Wo Contrast  Result Date: 10/19/2019 CLINICAL DATA:  86 year old female with head trauma. EXAM: CT HEAD WITHOUT CONTRAST CT CERVICAL SPINE WITHOUT CONTRAST TECHNIQUE: Multidetector CT imaging of the head and cervical spine was performed following the standard protocol without intravenous contrast. Multiplanar CT image reconstructions of the cervical  spine were also generated. COMPARISON:  Head CT dated 03/10/2017. FINDINGS: CT HEAD FINDINGS Brain: Moderate age-related atrophy and chronic microvascular ischemic changes. There is no acute intracranial hemorrhage. No mass effect or midline shift. No extra-axial fluid collection. Vascular: No hyperdense vessel or unexpected calcification. Skull: Normal. Negative for fracture or focal lesion. Sinuses/Orbits: No acute finding. Other: None CT CERVICAL SPINE FINDINGS Alignment: No acute subluxation. Skull base and vertebrae: No acute fracture. Osteopenia. Soft tissues and spinal canal: No prevertebral fluid or swelling. No visible canal hematoma. Disc levels: Extensive multilevel degenerative changes with endplate irregularity and disc space narrowing. Upper chest: Negative. Other: Bilateral carotid bulb calcified plaques. Thyroid nodules. IMPRESSION: 1. No acute intracranial hemorrhage. Age-related atrophy and chronic microvascular ischemic changes. 2. No acute/traumatic cervical spine pathology. Extensive multilevel degenerative changes. Electronically Signed   By: Anner Crete M.D.   On: 10/19/2019 17:56   DG C-Arm 1-60 Min  Result Date: 10/19/2019 CLINICAL DATA:  Open reduction and internal fixation of the left distal radius and ulna fractures. EXAM: LEFT WRIST - 2 VIEW; DG C-ARM 1-60 MIN COMPARISON:  Oct 19, 2019 FINDINGS: The patient has undergone plate and screw fixation of the wrist. The hardware is intact. Again noted are fractures of the distal radius and ulna with improved osseous alignment. Two images were obtained. The total fluoroscopy time was 1 minutes and 4 seconds. IMPRESSION: Status post ORIF of the wrist with improved osseous alignment. Electronically Signed   By: Constance Holster M.D.   On: 10/19/2019 22:41    Disposition: Discharge disposition: 01-Home or Self Care            Signed: Jolyn Nap 10/20/2019, 10:26 AM

## 2019-10-20 NOTE — Plan of Care (Signed)
  Problem: Education: Goal: Knowledge of General Education information will improve Description: Including pain rating scale, medication(s)/side effects and non-pharmacologic comfort measures Outcome: Progressing   Problem: Pain Managment: Goal: General experience of comfort will improve 10/20/2019 0753 by Melina Schools, RN Outcome: Progressing 10/20/2019 0752 by Melina Schools, RN Outcome: Progressing 10/20/2019 0752 by Melina Schools, RN Outcome: Progressing   Problem: Safety: Goal: Ability to remain free from injury will improve 10/20/2019 0753 by Melina Schools, RN Outcome: Progressing 10/20/2019 0752 by Melina Schools, RN Outcome: Progressing 10/20/2019 0752 by Melina Schools, RN Outcome: Progressing

## 2019-10-20 NOTE — Plan of Care (Signed)
  Problem: Education: Goal: Knowledge of General Education information will improve Description: Including pain rating scale, medication(s)/side effects and non-pharmacologic comfort measures Outcome: Progressing   Problem: Pain Managment: Goal: General experience of comfort will improve Outcome: Progressing   Problem: Safety: Goal: Ability to remain free from injury will improve Outcome: Progressing   

## 2019-10-20 NOTE — Plan of Care (Signed)
  Problem: Education: Goal: Knowledge of General Education information will improve Description: Including pain rating scale, medication(s)/side effects and non-pharmacologic comfort measures Outcome: Progressing   Problem: Health Behavior/Discharge Planning: Goal: Ability to manage health-related needs will improve Outcome: Progressing   Problem: Activity: Goal: Risk for activity intolerance will decrease Outcome: Progressing   Problem: Nutrition: Goal: Adequate nutrition will be maintained Outcome: Progressing   Problem: Coping: Goal: Level of anxiety will decrease Outcome: Progressing   Problem: Safety: Goal: Ability to remain free from injury will improve Outcome: Progressing   Problem: Skin Integrity: Goal: Risk for impaired skin integrity will decrease Outcome: Progressing   Problem: Education: Goal: Knowledge of General Education information will improve Description: Including pain rating scale, medication(s)/side effects and non-pharmacologic comfort measures Outcome: Progressing   Problem: Health Behavior/Discharge Planning: Goal: Ability to manage health-related needs will improve Outcome: Progressing   Problem: Activity: Goal: Risk for activity intolerance will decrease Outcome: Progressing   Problem: Nutrition: Goal: Adequate nutrition will be maintained Outcome: Progressing   Problem: Coping: Goal: Level of anxiety will decrease Outcome: Progressing   Problem: Safety: Goal: Ability to remain free from injury will improve Outcome: Progressing   Problem: Skin Integrity: Goal: Risk for impaired skin integrity will decrease Outcome: Progressing

## 2019-10-21 MED ORDER — ZOLEDRONIC ACID 5 MG/100ML IV SOLN: 5.0000 mg | Freq: Once | INTRAVENOUS | Status: DC

## 2019-10-21 NOTE — Plan of Care (Signed)
  Problem: Education: Goal: Knowledge of General Education information will improve Description: Including pain rating scale, medication(s)/side effects and non-pharmacologic comfort measures 10/21/2019 1537 by Melina Schools, RN Outcome: Adequate for Discharge 10/21/2019 [phone] by Melina Schools, RN Outcome: Progressing   Problem: Health Behavior/Discharge Planning: Goal: Ability to manage health-related needs will improve Outcome: Adequate for Discharge   Problem: Clinical Measurements: Goal: Ability to maintain clinical measurements within normal limits will improve Outcome: Adequate for Discharge Goal: Will remain free from infection Outcome: Adequate for Discharge Goal: Diagnostic test results will improve Outcome: Adequate for Discharge Goal: Respiratory complications will improve Outcome: Adequate for Discharge Goal: Cardiovascular complication will be avoided Outcome: Adequate for Discharge   Problem: Activity: Goal: Risk for activity intolerance will decrease Outcome: Adequate for Discharge   Problem: Nutrition: Goal: Adequate nutrition will be maintained Outcome: Adequate for Discharge   Problem: Coping: Goal: Level of anxiety will decrease 10/21/2019 1537 by Melina Schools, RN Outcome: Adequate for Discharge 10/21/2019 [phone] by Melina Schools, RN Outcome: Progressing   Problem: Elimination: Goal: Will not experience complications related to bowel motility Outcome: Adequate for Discharge Goal: Will not experience complications related to urinary retention Outcome: Adequate for Discharge   Problem: Pain Managment: Goal: General experience of comfort will improve 10/21/2019 1537 by Melina Schools, RN Outcome: Adequate for Discharge 10/21/2019 [phone] by Melina Schools, RN Outcome: Progressing   Problem: Safety: Goal: Ability to remain free from injury will improve 10/21/2019 1537 by Melina Schools, RN Outcome: Adequate for  Discharge 10/21/2019 [phone] by Melina Schools, RN Outcome: Progressing   Problem: Skin Integrity: Goal: Risk for impaired skin integrity will decrease Outcome: Adequate for Discharge

## 2019-10-21 NOTE — Plan of Care (Signed)

## 2019-10-21 NOTE — TOC Transition Note (Signed)
Transition of Care [facility]) - CM/SW Discharge Note   Patient Details  Name: Tara Bradley MRN: [id number] Date of Birth: 1923-01-08  Transition of Care [facility]) CM/SW Contact:  Claudie Leach, RN 10/21/2019, 12:01 PM   Clinical Narrative:    Patient to d/c home with family assist/caregivers.  Patient provided RW with left platform and 3n1 at her request.  Patient declines HH, stating she doesn't think she needs.  Patient may contact PMD after d/c if changes mind. Patient plans to have 24 hour supervision.   Final next level of care: Home/Self Care Barriers to Discharge: No Barriers Identified   Patient Goals and CMS Choice Patient states their goals for this hospitalization and ongoing recovery are:: to get home CMS Medicare.gov Compare Post Acute Care list provided to:: Patient Choice offered to / list presented to : Patient  Discharge Plan and Services                DME Arranged: 3-N-1, Walker platform DME Agency: [employer] Date DME Agency Contacted: 10/21/19 Time DME Agency Contacted: 0900 Representative spoke with at DME Agency: [place]: [place]

## 2019-10-21 NOTE — Progress Notes (Signed)
Discharge Instructions given, verbalized and understanding. IV removed. Out to car via W/C with friend that will stay with her.

## 2019-10-21 NOTE — Evaluation (Signed)
Physical Therapy Evaluation Patient Details Name: Tara Bradley MRN: [id number] DOB: 1922-12-28 Today's Date: 10/21/2019   History of Present Illness  86 yo F s/p mechanical fall with Left ulnar and radius fx with repair 10/18/19. PMH significant for but not liminted to: DM, HTN, Osteoporosis, syncope, OA, breast cancer  Clinical Impression  Patient is s/p above surgery resulting in functional limitations due to the deficits listed below (see PT Problem List).  Patient will benefit from skilled PT to increase their independence and safety with mobility to allow discharge to home. Pt is hoping to hire a caregiver(s) for 24 hour care at first upon DC. If DC home today she said her nephew could likely stay with her tonight. Pt will likely do well with mobility at home and ADLs may be the bigger challenge. Recommend HHPT safety eval. If pt remains in the hospital getting OT involved for ADL management would likely be beneficial. Pt is well aware of safety issues challenges she may face and is realistic about what she will and won't be able to do herself at home. She does want a 3/1 Commode for home.        Follow Up Recommendations Home health PT;Supervision for mobility/OOB    Equipment Recommendations  Rolling walker with 5" wheels;3in1 (PT)(need left platform)    Recommendations for Other Services       Precautions / Restrictions Precautions Precautions: Fall Required Braces or Orthoses: Splint/Cast;Other Brace(ast on LUE and sling) Restrictions Weight Bearing Restrictions: Yes      Mobility  Bed Mobility Overal bed mobility: Modified Independent             General bed mobility comments: increased time but able to get up without assist with bed flat and no rails  Transfers Overall transfer level: Needs assistance Equipment used: Left platform walker Transfers: Sit to/from Stand Sit to Stand: Supervision         General transfer comment: cues for safest  technique  Ambulation/Gait Ambulation/Gait assistance: Supervision Gait Distance (Feet): 100 Feet Assistive device: Left platform walker Gait Pattern/deviations: Step-through pattern     General Gait Details: No blance losses. Able to control RW reasonably well.   Stairs            Wheelchair Mobility    Modified Rankin (Stroke Patients Only)       Balance                                             Pertinent Vitals/Pain Pain Assessment: 0-10 Pain Score: 3  Pain Location: L UE Pain Intervention(s): Limited activity within patient's tolerance;Monitored during session    [place] expects to be discharged to:: Private residence Living Arrangements: Alone Available Help at Discharge: Other (Comment)(Pt looking to hire 24 hour care at first upon DC. ) Type of Home: Apartment Home Access: Level entry     Home Layout: One level Home Equipment: [place] - 2 wheels;Cane - single point Additional Comments: Pt reports she has a walker from [place] and doesn't think a platform will fit on it.     Prior Function Level of Independence: Independent with assistive device(s)               Hand Dominance   Dominant Hand: Right    Extremity/Trunk Assessment   Upper Extremity Assessment Upper Extremity Assessment: LUE deficits/detail LUE Deficits /  Details: LUE in cast and sling.  Fingers swollen    Lower Extremity Assessment Lower Extremity Assessment: Overall WFL for tasks assessed    Cervical / Trunk Assessment Cervical / Trunk Assessment: Normal  Communication   Communication: No difficulties  Cognition Arousal/Alertness: Awake/alert Behavior During Therapy: WFL for tasks assessed/performed Overall Cognitive Status: Within Functional Limits for tasks assessed                                        General Comments General comments (skin integrity, edema, etc.): No family present.     Exercises      Assessment/Plan    PT Assessment Patient needs continued PT services  PT Problem List Decreased mobility;Decreased knowledge of use of DME;Pain       PT Treatment Interventions DME instruction;Gait training;Patient/family education    PT Goals (Current goals can be found in the Care Plan section)  Acute Rehab PT Goals Patient Stated Goal: to not go to a NH PT Goal Formulation: With patient Time For Goal Achievement: 10/28/19 Potential to Achieve Goals: Good    Frequency Min 3X/week   Barriers to discharge        Co-evaluation               AM-PAC PT "6 Clicks" Mobility  Outcome Measure Help needed turning from your back to your side while in a flat bed without using bedrails?: None Help needed moving from lying on your back to sitting on the side of a flat bed without using bedrails?: None Help needed moving to and from a bed to a chair (including a wheelchair)?: A Little Help needed standing up from a chair using your arms (e.g., wheelchair or bedside chair)?: A Little Help needed to walk in hospital room?: A Little Help needed climbing 3-5 steps with a railing? : A Little 6 Click Score: 20    End of Session Equipment Utilized During Treatment: Gait belt;Other (comment)(sling on LUE) Activity Tolerance: Patient tolerated treatment well Patient left: in chair;with call bell/phone within reach;Other (comment)(No chair alarm in room and pt had been up previous day. ) Nurse Communication: Mobility status;Other (comment)(Discussed with NT along with no chair alarm pad. ) PT Visit Diagnosis: Unsteadiness on feet (R26.81)    Time: [id number] PT Time Calculation (min) (ACUTE ONLY): 31 min   Charges:   PT Evaluation $PT Eval Moderate Complexity: 1 Mod PT Treatments $Gait Training: 8-22 mins        Lavonia Dana, PT   Acute Rehabilitation Services  Pager [phone] Office [phone] 10/21/2019   Melvern Banker 10/21/2019, 8:46 AM

## 2019-10-22 NOTE — Progress Notes (Signed)
Occupational Therapy Evaluation (late entry)  Patient evaluated by Occupational Therapy with no further acute OT needs identified. All education has been completed and the patient has no further questions. All education completed - see below for details. See below for any follow-up Occupational Therapy or equipment needs. OT is signing off. Thank you for this referral.    10/21/19 1609  OT Visit Information  Last OT Received On 10/22/19  Assistance Needed +1  History of Present Illness 86 yo F s/p mechanical fall with Left ulnar and radius fx with repair 10/18/19. PMH significant for but not liminted to: DM, HTN, Osteoporosis, syncope, OA, breast cancer  Precautions  Precautions Fall  Required Braces or Orthoses Splint/Cast (post op long arm cast Lt UE )  Splint/Cast post op long arm cast Lt UE   Restrictions  Weight Bearing Restrictions Yes  Other Position/Activity Restrictions NWB Lt wrist   Home Living  Family/patient expects to be discharged to: Private residence  Living Arrangements Alone  Available Help at Discharge Friend(s);Available 24 hours/day (initially )  Type of Home Apartment  Home Access Level entry  Home Layout One level  Bathroom Shower/Tub Walk-in shower  Bathroom Toilet Handicapped height  Bathroom Accessibility Yes  How Accessible Accessible via walker  [place] - 2 wheels;Cane - single point;Shower seat  Additional Comments Pt will have friends staying with her through Wednesday and has contacted [employer]   Prior Function  Level of Independence Independent with assistive device(s)  Communication  Communication No difficulties  Pain Assessment  Pain Assessment Faces  Faces Pain Scale 4  Pain Location Lt digits with attempts at PROM   Pain Descriptors / Indicators Grimacing  Pain Intervention(s) Monitored during session  Cognition  Arousal/Alertness Awake/alert  Behavior During Therapy [facility] for tasks assessed/performed  Overall  Cognitive Status Within Functional Limits for tasks assessed  Upper Extremity Assessment  Upper Extremity Assessment LUE deficits/detail  LUE Deficits / Details LUE in cast and sling.  Fingers edematous and bruised   LUE Coordination decreased fine motor  Lower Extremity Assessment  Lower Extremity Assessment Defer to PT evaluation  Cervical / Trunk Assessment  Cervical / Trunk Assessment Other exceptions  Cervical / Trunk Exceptions scolioisis  ADL  Overall ADL's  Needs assistance/impaired  Eating/Feeding Modified independent  Grooming Wash/dry hands;Wash/dry face;Oral care;Brushing hair;Supervision/safety;Standing  Upper Body Bathing Set up;Supervision/ safety;Sitting  Lower Body Bathing Supervison/ safety;Sit to/from stand  Upper Body Dressing  Supervision/safety;Sitting  Upper Body Dressing Details (indicate cue type and reason) Pt instructed to don Lt UE first.  She was able to slide sleeve over cast with effort.  Requires increased time to complete activity   Lower Body Dressing Supervision/safety;Sit to/from stand  Lower Body Dressing Details (indicate cue type and reason) discussed use of sock aid as she has had one in the past, but with increased time and effort, she was able to don socks one Administrator, arts Supervision/safety;Ambulation;Comfort height toilet;RW  Toileting- Water quality scientist and Hygiene Supervision/safety;Sit to/from stand  Tub/ Engineer, maintenance  Functional mobility during ADLs Supervision/safety  General ADL Comments Discussed use of walker bag.  keeping phone with her at all times.  She was instructed how to move items around her kitchen by sliding them over the counter tops.  Discussed grocery delivery services and easy to prep food items that can be microwaved.  She has several reachers.  Instructed her that she will need to bag and tape Lt cast if she showers and  I would recommend she have assist with this and showering. She  verbally agreed with all   Vision- History  Baseline Vision/History Wears glasses  Wears Glasses At all times  Patient Visual Report No change from baseline  Bed Mobility  Overal bed mobility Modified Independent  Transfers  Overall transfer level Needs assistance  Equipment used Left platform walker  Transfers Sit to/from Stand  Sit to Stand Supervision  General transfer comment Adjusted her platform walker to fit her.  Min cues for safe technique/use   General Comments  General comments (skin integrity, edema, etc.) Pt instructed how to don/doff sling by sliding over her head. She was abel to demonstrate understanding, but required min A due to coban wrap sticking to sling   Exercises  Exercises Other exercises  Other Exercises  Other Exercises Pt demonstrates difficulty with AROM of Lt digits, but able to perform AAROM flexion/ext.  Encouraged her to perform several times throughout the day and to elevate Lt UE for edema control   Other Exercises Pt instructed in shoulder scaption and able to perform x 7.  Encouraged her to perform 2-4x /day to prevent shoulder tightness   OT - End of Session  Equipment Utilized During Treatment Rolling walker  Activity Tolerance Patient tolerated treatment well  Patient left in bed;with call bell/phone within reach;with bed alarm set  OT Assessment  OT Recommendation/Assessment Patient does not need any further OT services  OT Visit Diagnosis Unsteadiness on feet (R26.81)  OT Problem List Decreased strength;Decreased activity tolerance;Impaired balance (sitting and/or standing);Decreased coordination;Decreased knowledge of use of DME or AE;Decreased knowledge of precautions;Impaired UE functional use  AM-PAC OT "6 Clicks" Daily Activity Outcome Measure (Version 2)  Help from another person eating meals? 4  Help from another person taking care of personal grooming? 3  Help from another person toileting, which includes using toliet, bedpan, or  urinal? 3  Help from another person bathing (including washing, rinsing, drying)? 3  Help from another person to put on and taking off regular upper body clothing? 3  Help from another person to put on and taking off regular lower body clothing? 3  6 Click Score 19  OT Recommendation  Follow Up Recommendations No OT follow up;Supervision/Assistance - 24 hour (initially, then tapering to intermittent)  OT Equipment 3 in 1 bedside commode  Acute Rehab OT Goals  Patient Stated Goal to go home   OT Goal Formulation All assessment and education complete, DC therapy  OT Time Calculation  OT Start Time (ACUTE ONLY) 1544  OT Stop Time (ACUTE ONLY) 1622  OT Time Calculation (min) 38 min  OT General Charges  $OT Visit 1 Visit  OT Evaluation  $OT Eval Moderate Complexity 1 Mod  OT Treatments  $Self Care/Home Management  23-37 mins  Written Expression  Dominant Hand Right  Nilsa Nutting., OTR/L Acute Rehabilitation Services Pager [phone] Office [phone]

## 2019-10-23 ENCOUNTER — Encounter: Payer: Medicare Other | Admitting: Physical Therapy

## 2019-10-30 ENCOUNTER — Encounter: Payer: Medicare Other | Admitting: Physical Therapy

## 2019-11-22 ENCOUNTER — Other Ambulatory Visit: Payer: Self-pay | Admitting: Internal Medicine

## 2019-11-22 DIAGNOSIS — M810 Age-related osteoporosis without current pathological fracture: Secondary | ICD-10-CM

## 2019-11-22 DIAGNOSIS — Z8781 Personal history of (healed) traumatic fracture: Secondary | ICD-10-CM

## 2019-12-21 ENCOUNTER — Ambulatory Visit: Payer: Medicare Other | Admitting: Podiatry

## 2019-12-21 ENCOUNTER — Other Ambulatory Visit: Payer: Self-pay

## 2019-12-21 ENCOUNTER — Encounter: Payer: Self-pay | Admitting: Podiatry

## 2019-12-21 DIAGNOSIS — B351 Tinea unguium: Secondary | ICD-10-CM

## 2019-12-21 DIAGNOSIS — D689 Coagulation defect, unspecified: Secondary | ICD-10-CM | POA: Diagnosis not present

## 2019-12-21 DIAGNOSIS — M79674 Pain in right toe(s): Secondary | ICD-10-CM | POA: Diagnosis not present

## 2019-12-21 DIAGNOSIS — M79675 Pain in left toe(s): Secondary | ICD-10-CM

## 2019-12-21 NOTE — Progress Notes (Signed)
This patient returns to my office for at risk foot care.  This patient requires this care by a professional since this patient will be at risk due to having coagulation defect and diabetes.  Patient is taking eliquis. Patient has not een seen for years. This patient is unable to cut nails herself since the patient cannot reach her nails.These nails are painful walking and wearing shoes.  This patient presents for at risk foot care today.  General Appearance  Alert, conversant and in no acute stress.  Vascular  Dorsalis pedis and posterior tibial  pulses are palpable  bilaterally.  Capillary return is within normal limits  bilaterally. Temperature is within normal limits  bilaterally.  Neurologic  Senn-Weinstein monofilament wire test within normal limits  bilaterally. Muscle power within normal limits bilaterally.  Nails Thick disfigured discolored nails with subungual debris  from hallux to fifth toes bilaterally. No evidence of bacterial infection or drainage bilaterally.  Orthopedic  No limitations of motion  feet .  No crepitus or effusions noted.  No bony pathology or digital deformities noted. Hammer toes  B/L.  Skin  normotropic skin with no porokeratosis noted bilaterally.  No signs of infections or ulcers noted.     Onychomycosis  Pain in right toes  Pain in left toes  Consent was obtained for treatment procedures.   Mechanical debridement of nails 1-5  bilaterally performed with a nail nipper.  Filed with dremel without incident.    Return office visit    3 months                  Told patient to return for periodic foot care and evaluation due to potential at risk complications.   Gardiner Barefoot DPM

## 2019-12-27 ENCOUNTER — Other Ambulatory Visit: Payer: Self-pay | Admitting: Cardiology

## 2020-02-14 ENCOUNTER — Other Ambulatory Visit: Payer: Medicare Other

## 2020-02-27 ENCOUNTER — Encounter (INDEPENDENT_AMBULATORY_CARE_PROVIDER_SITE_OTHER): Payer: Medicare Other | Admitting: Ophthalmology

## 2020-03-05 ENCOUNTER — Other Ambulatory Visit: Payer: Self-pay

## 2020-03-05 ENCOUNTER — Ambulatory Visit
Admission: RE | Admit: 2020-03-05 | Discharge: 2020-03-05 | Disposition: A | Discharge status: Home / Self Care | Payer: Medicare Other | Source: Ambulatory Visit | Attending: Internal Medicine | Admitting: Internal Medicine

## 2020-03-05 DIAGNOSIS — M810 Age-related osteoporosis without current pathological fracture: Secondary | ICD-10-CM

## 2020-03-05 DIAGNOSIS — Z8781 Personal history of (healed) traumatic fracture: Secondary | ICD-10-CM

## 2020-03-09 NOTE — Progress Notes (Signed)
Cardiology Office Note   Date:  03/10/2020   ID:  Tara Bradley, DOB 1922-06-03, MRN [id number]  PCP:  [name], MD  Cardiologist: Dr. Percival Spanish, 08/15/2017 [name], MD 05/26/2017  Chief Complaint  Patient presents with  . Atrial Fibrillation    History of Present Illness: Tara Bradley is a 86 y.o. female with a history of an aortic thrombosed descending aortic dissection, aortic valve sclerosis and chronic afib on Xarelto.   Since I last saw her she has been to [place] for several weeks to visit her family. The patient denies any new symptoms such as chest discomfort, neck or arm discomfort. There has been no new shortness of breath, PND or orthopnea. There have been no reported palpitations, presyncope or syncope.   Past Medical History:  Diagnosis Date  . Aortic valve sclerosis    Echo, 2008  . Arthritis   . Bradycardia    October, 2012  . Cancer ([place])   . Chronic insomnia   . Closed fracture of unspecified part of femur 2005  . Diabetes mellitus, type 2 ([place])   . Diverticulitis   . Diverticulosis   . Ejection fraction    EF 60%, echo, February, 2008  //   EF 65-70%, echo, November, 2012  . Hyperlipidemia   . Hypertension   . Long term (current) use of anticoagulants   . Osteoporosis    femur fracture 2005, pelvic fracture 2006  . Persistent atrial fibrillation ([place])   . Personal history of malignant neoplasm of breast   . Syncope   . Thoracic aortic aneurysm ([place]) 02/2017  . Unspecified closed fracture of pelvis 2006    Past Surgical History:  Procedure Laterality Date  . CARDIOVERSION N/A 08/09/2012   Procedure: CARDIOVERSION;  Surgeon: Deboraha Sprang, MD;  Location: [facility];  Service: Cardiovascular;  Laterality: N/A;  . EYE SURGERY    . FEMUR SURGERY  2005   ORIF  . IR ANGIOGRAM PELVIS SELECTIVE OR SUPRASELECTIVE  03/11/2017  . IR ANGIOGRAM SELECTIVE EACH ADDITIONAL VESSEL  03/11/2017  . IR EMBO ART  VEN HEMORR LYMPH EXTRAV  INC  GUIDE ROADMAPPING  03/11/2017  . IR FLUORO GUIDE CV LINE RIGHT  03/11/2017  . IR US GUIDE VASC ACCESS RIGHT  03/11/2017  . IR US GUIDE VASC ACCESS RIGHT  03/11/2017  . MASTECTOMY  1995   left  . ORIF ULNAR FRACTURE Left 10/19/2019   Procedure: OPEN REDUCTION INTERNAL FIXATION (ORIF)  LEFT DISTAL RADIUS  AND ULNA FRACTURE;  Surgeon: Milly Jakob, MD;  Location: [facility];  Service: Orthopedics;  Laterality: Left;  . SHOULDER SURGERY     left  . [place], 2003  . TONSILLECTOMY    . TOTAL ABDOMINAL HYSTERECTOMY W/ BILATERAL SALPINGOOPHORECTOMY  1995  . WRIST SURGERY      x 2     Current Outpatient Medications  Medication Sig Dispense Refill  . acetaminophen (TYLENOL) 325 MG tablet Take 2 tablets (650 mg total) by mouth every 4 (four) hours as needed for mild pain.    . Ascorbic Acid (VITAMIN C PO) Take 1 tablet by mouth daily.    . B Complex-C (B-COMPLEX WITH VITAMIN C) tablet Take 1 tablet by mouth daily.    . Calcium Carbonate-Vitamin D (CALTRATE 600+D) 600-400 MG-UNIT per tablet Take 1 tablet by mouth daily.      . Cholecalciferol (VITAMIN D) 2000 UNITS tablet Take 1,000 Units by mouth daily.     [name]  digoxin (LANOXIN) 0.125 MG tablet Take 0.0625 mg by mouth daily. One half tablet daily    . diltiazem (CARDIZEM CD) 360 MG 24 hr capsule Take 1 capsule (360 mg total) by mouth daily before breakfast.    . docusate sodium (COLACE) 100 MG capsule Take 1 capsule (100 mg total) by mouth 2 (two) times daily. 10 capsule 0  . furosemide (LASIX) 40 MG tablet Take 20 mg by mouth daily.    . insulin lispro (HUMALOG) 100 UNIT/ML injection Inject 5-7 Units into the skin 2 (two) times daily. 7 units before breakfast, 5 units at bedtime    . losartan-hydrochlorothiazide (HYZAAR) 100-12.5 MG tablet Take 1 tablet by mouth every morning.    . Magnesium 250 MG TABS Take 1 tablet by mouth daily.      . metFORMIN (GLUMETZA) 500 MG (MOD) 24 hr tablet Take 1,000 mg by mouth every evening.    .  metoprolol tartrate (LOPRESSOR) 50 MG tablet Take 1 tablet (50 mg total) by mouth 2 (two) times daily. 180 tablet 3  . Multiple Vitamins-Minerals (PRESERVISION AREDS PO) Take 1 capsule by mouth daily.    . potassium chloride (K-DUR) 10 MEQ tablet Take 10 mEq by mouth daily.     . temazepam (RESTORIL) 30 MG capsule TAKE ONE CAPSULE(30 MG)  AT BEDTIME AS NEEDED FOR SLEEP  5  . XARELTO 15 MG TABS tablet TAKE ONE TABLET EACH DAY WITH SUPPER 90 tablet 1   No current facility-administered medications for this visit.     Allergies:   Ambien [zolpidem tartrate], Codeine, Codeine sulfate, and Latex    ROS:  Positive for none. All other systems are reviewed and negative.   PHYSICAL EXAM: VS:  BP (!) 150/78   Pulse (!) 106   Ht 5' 1.5" (1.562 m)   Wt 134 lb 12.8 oz (61.1 kg)   BMI 25.06 kg/m  , BMI Body mass index is 25.06 kg/m. GENERAL:  Well appearing NECK:  No jugular venous distention, waveform within normal limits, carotid upstroke brisk and symmetric, no bruits, no thyromegaly LUNGS:  Clear to auscultation bilaterally CHEST:  Unremarkable HEART:  PMI not displaced or sustained,S1 and S2 within normal limits, no S3, no clicks, no rubs, no murmurs, irregular  ABD:  Flat, positive bowel sounds normal in frequency in pitch, no bruits, no rebound, no guarding, no midline pulsatile mass, no hepatomegaly, no splenomegaly EXT:  2 plus pulses throughout, no edema, no cyanosis no clubbing  EKG:  EKG is  ordered today. Atrial fibrillation, rate 106, axis within normal limits, intervals within normal limits, old anteroseptal infarct.  Recent Labs: 10/19/2019: BUN 16; Creatinine, Ser 0.82; Hemoglobin 14.7; Platelets 206; Potassium 4.0; Sodium 137    Lipid Panel    Component Value Date/Time   CHOL 132 03/13/2017 0431   TRIG 72 03/13/2017 0431   HDL 48 03/13/2017 0431   CHOLHDL 2.8 03/13/2017 0431   VLDL 14 03/13/2017 0431   LDLCALC 70 03/13/2017 0431   LDLDIRECT 37 03/10/2009 1106     Wt  Readings from Last 3 Encounters:  03/10/20 134 lb 12.8 oz (61.1 kg)  10/19/19 136 lb (61.7 kg)  09/21/19 139 lb (63 kg)     Other studies Reviewed: Additional studies/ records that were reviewed today include:  Labs  ASSESSMENT AND PLAN  Permanent atrial fibrillation:    [name] has a CHA2DS2 - VASc score of 5.    She is going to keep an eye on her  heart rate and let me know if she is consistently above 100 I would probably try to go up on her beta-blocker even a little bit more.   Chronic diastolic CHF:   She seems to be euvolemic.  No change in therapy.   Aortic aneurysm with chronic descending dissection:   Given her advanced age we are managing this medically.  No further imaging   Current medicines are reviewed at length with the patient today.  The patient does not have concerns regarding medicines.  The following changes have been made:   None  Labs/ tests ordered today include: None  Orders Placed This Encounter  Procedures  . EKG 12-Lead     Disposition:    Follow up in 6 months  Signed, [name], MD  03/10/2020 1:19 PM    [facility]

## 2020-03-10 ENCOUNTER — Other Ambulatory Visit: Payer: Self-pay

## 2020-03-10 ENCOUNTER — Encounter: Payer: Self-pay | Admitting: Cardiology

## 2020-03-10 ENCOUNTER — Ambulatory Visit: Payer: Medicare Other | Admitting: Cardiology

## 2020-03-10 VITALS — BP 150/78 | HR 106 | Ht 61.5 in | Wt 134.8 lb

## 2020-03-10 DIAGNOSIS — I482 Chronic atrial fibrillation, unspecified: Secondary | ICD-10-CM | POA: Diagnosis not present

## 2020-03-10 DIAGNOSIS — I5032 Chronic diastolic (congestive) heart failure: Secondary | ICD-10-CM

## 2020-03-10 DIAGNOSIS — I71012 Dissection of descending thoracic aorta: Secondary | ICD-10-CM

## 2020-03-10 DIAGNOSIS — I7101 Dissection of thoracic aorta: Secondary | ICD-10-CM

## 2020-03-10 NOTE — Patient Instructions (Signed)
Medication Instructions:  Your physician recommends that you continue on your current medications as directed. Please refer to the Current Medication list given to you today.  *If you need a refill on your cardiac medications before your next appointment, please call your pharmacy*  Follow-Up: At [facility], you and your health needs are our priority.  As part of our continuing mission to provide you with exceptional heart care, we have created designated Provider Care Teams.  These Care Teams include your primary Cardiologist (physician) and Advanced Practice Providers (APPs -  Physician Assistants and Nurse Practitioners) who all work together to provide you with the care you need, when you need it.  We recommend signing up for the patient portal called "MyChart".  Sign up information is provided on this After Visit Summary.  MyChart is used to connect with patients for Virtual Visits (Telemedicine).  Patients are able to view lab/test results, encounter notes, upcoming appointments, etc.  Non-urgent messages can be sent to your provider as well.   To learn more about what you can do with MyChart, go to [email or web address].    Your next appointment:   6 month(s)  The format for your next appointment:   In Person  Provider:   [name], MD   Other Instructions [place] [phone]

## 2020-03-12 ENCOUNTER — Encounter (INDEPENDENT_AMBULATORY_CARE_PROVIDER_SITE_OTHER): Payer: Medicare Other | Admitting: Ophthalmology

## 2020-03-12 ENCOUNTER — Other Ambulatory Visit: Payer: Self-pay

## 2020-03-12 DIAGNOSIS — H35372 Puckering of macula, left eye: Secondary | ICD-10-CM | POA: Diagnosis not present

## 2020-03-12 DIAGNOSIS — H353132 Nonexudative age-related macular degeneration, bilateral, intermediate dry stage: Secondary | ICD-10-CM | POA: Diagnosis not present

## 2020-03-12 DIAGNOSIS — H35033 Hypertensive retinopathy, bilateral: Secondary | ICD-10-CM

## 2020-03-12 DIAGNOSIS — I10 Essential (primary) hypertension: Secondary | ICD-10-CM

## 2020-03-12 DIAGNOSIS — H43813 Vitreous degeneration, bilateral: Secondary | ICD-10-CM

## 2020-03-15 ENCOUNTER — Other Ambulatory Visit: Payer: Self-pay | Admitting: Cardiology

## 2020-03-20 ENCOUNTER — Other Ambulatory Visit: Payer: Self-pay | Admitting: Cardiology

## 2020-03-29 ENCOUNTER — Other Ambulatory Visit: Payer: Self-pay | Admitting: Cardiology

## 2020-04-11 ENCOUNTER — Other Ambulatory Visit: Payer: Self-pay | Admitting: Cardiology

## 2020-05-26 DIAGNOSIS — E1165 Type 2 diabetes mellitus with hyperglycemia: Secondary | ICD-10-CM | POA: Diagnosis not present

## 2020-05-26 DIAGNOSIS — Z8781 Personal history of (healed) traumatic fracture: Secondary | ICD-10-CM | POA: Diagnosis not present

## 2020-05-26 DIAGNOSIS — M810 Age-related osteoporosis without current pathological fracture: Secondary | ICD-10-CM | POA: Diagnosis not present

## 2020-05-30 DIAGNOSIS — M25532 Pain in left wrist: Secondary | ICD-10-CM | POA: Diagnosis not present

## 2020-06-11 ENCOUNTER — Other Ambulatory Visit: Payer: Self-pay | Admitting: Cardiology

## 2020-06-19 DIAGNOSIS — H903 Sensorineural hearing loss, bilateral: Secondary | ICD-10-CM | POA: Diagnosis not present

## 2020-06-20 DIAGNOSIS — M25532 Pain in left wrist: Secondary | ICD-10-CM | POA: Diagnosis not present

## 2020-06-25 DIAGNOSIS — M79642 Pain in left hand: Secondary | ICD-10-CM | POA: Diagnosis not present

## 2020-06-25 DIAGNOSIS — Z969 Presence of functional implant, unspecified: Secondary | ICD-10-CM | POA: Diagnosis not present

## 2020-06-26 DIAGNOSIS — M79642 Pain in left hand: Secondary | ICD-10-CM | POA: Diagnosis not present

## 2020-06-26 DIAGNOSIS — Z969 Presence of functional implant, unspecified: Secondary | ICD-10-CM | POA: Diagnosis not present

## 2020-07-24 ENCOUNTER — Other Ambulatory Visit: Payer: Self-pay | Admitting: Orthopedic Surgery

## 2020-07-24 DIAGNOSIS — Z969 Presence of functional implant, unspecified: Secondary | ICD-10-CM | POA: Diagnosis not present

## 2020-07-24 DIAGNOSIS — M79642 Pain in left hand: Secondary | ICD-10-CM | POA: Diagnosis not present

## 2020-07-27 ENCOUNTER — Other Ambulatory Visit: Payer: Self-pay | Admitting: Cardiology

## 2020-07-28 ENCOUNTER — Telehealth: Payer: Self-pay

## 2020-07-28 NOTE — Telephone Encounter (Signed)
   [facility] Pre-operative Risk Assessment    HEARTCARE STAFF: - Please ensure there is not already an duplicate clearance open for this procedure. - Under Visit Info/Reason for Call, type in Other and utilize the format Clearance MM/DD/YY or Clearance TBD. Do not use dashes or single digits. - If request is for dental extraction, please clarify the # of teeth to be extracted.  Request for surgical clearance:  1. What type of surgery is being performed? REMOVAL OF HARDWARE LEFT FOREARM   2. When is this surgery scheduled? 09-01-2020   3. What type of clearance is required (medical clearance vs. Pharmacy clearance to hold med vs. Both)? BOTH  4. Are there any medications that need to be held prior to surgery and how long? [place]    5. Practice name and name of physician performing surgery? [facility] ATTN:TATIANA    6. What is the office phone number? [phone]   7.   What is the office fax number? [phone]  8.   Anesthesia type (None, local, MAC, general) ? MAC W/PREOP BLOCK   Waylan Rocher 07/28/2020, 2:53 PM  _________________________________________________________________   (provider comments below)

## 2020-07-28 NOTE — Telephone Encounter (Signed)
Patient with diagnosis of A Fib on Xarelto for anticoagulation.    1. Procedure: REMOVAL OF HARDWARE LEFT FOREARM   Date of procedure: 09/01/20   CHA2DS2-VASc Score = 6  This indicates a 9.7% annual risk of stroke. The patient's score is based upon: CHF History: Yes HTN History: Yes Diabetes History: Yes Stroke History: No Vascular Disease History: No Age Score: 2 Gender Score: 1   CrCl 39 mL/min Platelet count 206K    Per office protocol, patient can hold Xarelto for 2 days prior to procedure.

## 2020-07-29 NOTE — Telephone Encounter (Signed)
   Primary Cardiologist: [name], MD  Chart reviewed as part of pre-operative protocol coverage. Patient was contacted 07/29/2020 in reference to pre-operative risk assessment for pending surgery as outlined below.  Tara Bradley was last seen on 03/10/20 by Dr. Percival Spanish.  Since that day, Tara Bradley has done well.  She can complete more than 4.0 METS without angina.   Therefore, based on ACC/AHA guidelines, the patient would be at acceptable risk for the planned procedure without further cardiovascular testing.   The patient was advised that if she develops new symptoms prior to surgery to contact our office to arrange for a follow-up visit, and she verbalized understanding.  Per our clinical pharmacist; Patient with diagnosis of A Fib on Xarelto for anticoagulation.    1. Procedure: REMOVAL OF HARDWARE LEFT FOREARM  Date of procedure: 09/01/20   CHA2DS2-VASc Score = 6  This indicates a 9.7% annual risk of stroke. The patient's score is based upon: CHF History: Yes HTN History: Yes Diabetes History: Yes Stroke History: No Vascular Disease History: No Age Score: 2 Gender Score: 1   CrCl 39 mL/min Platelet count 206K   Per office protocol, patient can hold Xarelto for 2 days prior to procedure.     I will route this recommendation to the requesting party via Epic fax function and remove from pre-op pool. Please call with questions.  Tami Lin Cedarius Kersh, PA 07/29/2020, 9:56 AM

## 2020-08-25 ENCOUNTER — Encounter (HOSPITAL_BASED_OUTPATIENT_CLINIC_OR_DEPARTMENT_OTHER): Payer: Self-pay | Admitting: Orthopedic Surgery

## 2020-08-25 ENCOUNTER — Other Ambulatory Visit: Payer: Self-pay

## 2020-08-25 DIAGNOSIS — M810 Age-related osteoporosis without current pathological fracture: Secondary | ICD-10-CM | POA: Diagnosis not present

## 2020-08-25 DIAGNOSIS — E1165 Type 2 diabetes mellitus with hyperglycemia: Secondary | ICD-10-CM | POA: Diagnosis not present

## 2020-08-25 DIAGNOSIS — Z8781 Personal history of (healed) traumatic fracture: Secondary | ICD-10-CM | POA: Diagnosis not present

## 2020-08-25 NOTE — Progress Notes (Signed)
Pt's procedure, pmh, ekg,& last echo reviewed with Dr Sabra Heck. Ok for surgery on 4/11 to be done at [facility].

## 2020-08-26 ENCOUNTER — Encounter (HOSPITAL_BASED_OUTPATIENT_CLINIC_OR_DEPARTMENT_OTHER)
Admission: RE | Admit: 2020-08-26 | Discharge: 2020-08-26 | Disposition: A | Discharge status: Home / Self Care | Payer: Medicare Other | Source: Ambulatory Visit | Attending: Orthopedic Surgery | Admitting: Orthopedic Surgery

## 2020-08-26 DIAGNOSIS — Z01812 Encounter for preprocedural laboratory examination: Secondary | ICD-10-CM | POA: Insufficient documentation

## 2020-08-26 DIAGNOSIS — Z969 Presence of functional implant, unspecified: Secondary | ICD-10-CM | POA: Diagnosis not present

## 2020-08-26 LAB — BASIC METABOLIC PANEL
Anion gap: 9 (ref 5–15)
BUN: 17 mg/dL (ref 8–23)
CO2: 30 mmol/L (ref 22–32)
Calcium: 9.4 mg/dL (ref 8.9–10.3)
Chloride: 95 mmol/L — ABNORMAL LOW (ref 98–111)
Creatinine, Ser: 0.94 mg/dL (ref 0.44–1.00)
GFR, Estimated: 56 mL/min — ABNORMAL LOW (ref >60)
Glucose, Bld: 283 mg/dL — ABNORMAL HIGH (ref 70–99)
Potassium: 3.8 mmol/L (ref 3.5–5.1)
Sodium: 134 mmol/L — ABNORMAL LOW (ref 135–145)

## 2020-08-26 NOTE — Progress Notes (Signed)

## 2020-08-26 NOTE — Progress Notes (Signed)
Spoke with Andi at Dr Cindra Eves office on 08/25/20 regarding the labwork done at that office. Did not draw a BMET, which we will need completed preoperatively per anesthesia guidelines. Called patient and informed that she would require lab drawn prior to surgery. Patient expressed understanding and plan to come to [facility] today for draw.

## 2020-08-28 NOTE — Progress Notes (Signed)
Glucose-283, Dr. Sabra Heck aware, will recheck day of surgery.

## 2020-08-29 ENCOUNTER — Other Ambulatory Visit (HOSPITAL_COMMUNITY)
Admission: RE | Admit: 2020-08-29 | Discharge: 2020-08-29 | Disposition: A | Discharge status: Home / Self Care | Payer: Medicare Other | Source: Ambulatory Visit | Attending: Orthopedic Surgery | Admitting: Orthopedic Surgery

## 2020-08-29 DIAGNOSIS — Z01812 Encounter for preprocedural laboratory examination: Secondary | ICD-10-CM | POA: Insufficient documentation

## 2020-08-29 DIAGNOSIS — Z20822 Contact with and (suspected) exposure to covid-19: Secondary | ICD-10-CM | POA: Diagnosis not present

## 2020-08-29 LAB — SARS CORONAVIRUS 2 (TAT 6-24 HRS): SARS Coronavirus 2: NEGATIVE

## 2020-08-29 NOTE — H&P (Addendum)
Primary Care Provider: Dr. [name] Referring Provider: ED Worker's Comp: No Date of Injury or Onset: 10-19-19 DOS: 10-19-19 Procedure: ORIF L distal BBFFx with bridge plate History: CC / Reason for Visit: Left wrist follow-up HPI: This patient presents for reevaluation, indicating that her hardware associated pain from her broken hardware remains sufficient for her to consider plate removal.  She has a planned worked up for her Xarelto, where she will hold that on Saturday and Sunday this week with planned surgery on Monday.  She demonstrates for me a snapping of her small finger.  HPI 07-24-20: This patient returns reevaluation, accompanied by a friend.  She reports that she still has pain that radiates into the long, ring, and small fingers dorsally, and all of this started after the plate fracture.  She also reports that on a positive note, her thumb is not as numb as it once was.  She remains on Xarelto for A. fib  HPI 06-26-20: This patient returns for evaluation of her left wrist.  At last visit in September, we had decided to remove her dorsal bridge plate.  However, after she consulted her other physicians, she decided against that.  She was doing well without pain until she was pushing to get up from a chair yesterday when she felt a pop and has had some soreness develop with some bruising and some radiation into the lesser digits.  She reports that the sensibility in her thumb continues to improve.  She reports today in a removable wrist splint  HPI 02-21-20: This patient returns for reevaluation, now having had an intervening trip to [place].  She reports that overall she is doing well, still with some subjective altered sensibility in the thumb and some occasional twinges of pain near the elbow.  Review of systems as related to current complaint reviewed and unchanged.  Exam:  Vitals: Refer to EMR. Constitutional:  WD, WN, NAD HEENT:  NCAT, EOMI Neuro/Psych:  Alert & oriented to  person, place, and time; appropriate mood & affect Lymphatic: No generalized UE edema or lymphadenopathy Extremities / MSK:  Both UE are normal with respect to appearance, ranges of motion, joint stability, muscle strength/tone, sensation, & perfusion except as otherwise noted:  The incision is well healed.  Pronation and supination are essentially full.  There is a little puffy fullness along the longitudinal aspect of the plate.  Elbow motion full.  There is a 10-15 arc with regard to wrist flexion-extension, and increased radial and ulnar deviation which is not tremendously different than the contralateral side.  The small finger has an intermittent jumping or catching associated with it, and seems to occur when she attempts to flex the small and ring fingers while leaving the index and long finger still extended.  There is no tenderness over the A1 pulley and it is not seem to be stenosing tenosynovitis.  The long extensor is seen subluxating ulnarly at the MP joint, but the snapping is an associated with that either.  It may be some snapping that associated with the lateral bands at the PIP as the PIP goes abruptly from extension into flexion.  Labs / Xrays:  None today.  X-rays from 07-24-20: 3 views of the left wrist that was obtained yesterday reveals fracture of the bridge plate near the junction between the 2 thicknesses, overlying the base of the third metacarpal.  The distal both bone forearm fracture has healed and remodeled some degree, with reasonably preserved radiocarpal joint space  Assessment:  10 months postop, with fractured implant, causing some increased tendon symptoms versus when the plate was not fractured, also with some snapping of the small finger that seems associated with dyssynchronous motor pattern  Plan: I discussed today's findings with her and options for proceeding.  She indicates that she would prefer to have the plate out instead of the symptoms with which she  presently lives, making use of the hand difficult.  She will hold her Xarelto on Saturday and Sunday and we will plan to remove the hardware through hopefully to smaller windows of incisions on Monday, under regional block/MAC.  The details of the operative procedure were discussed with the patient.  Questions were invited and answered.  In addition to the goal of the procedure, the risks of the procedure to include but not limited to bleeding; infection; damage to the nerves or blood vessels that could result in bleeding, numbness, weakness, chronic pain, and the need for additional procedures; stiffness; the need for revision surgery; and anesthetic risks were reviewed.  No specific outcome was guaranteed or implied.  Informed consent was obtained.  Work status: All interested parties should please consider this patient to be out of work entirely if no work is available that complies with the restrictions detailed below.  If these restrictions result in the patient being out of work entirely, the employer is expected to provide documentation of such to all interested third parties such as disability insurance companies: Not applicable  Autoauthenticated,  Rayvon Char. Grandville Silos, MD

## 2020-08-31 NOTE — Anesthesia Preprocedure Evaluation (Addendum)
Anesthesia Evaluation  Patient identified by MRN, date of birth, ID band Patient awake    Reviewed: Allergy & Precautions, NPO status , Patient's Chart, lab work & pertinent test results  History of Anesthesia Complications Negative for: history of anesthetic complications  Airway Mallampati: II  TM Distance: >3 FB Neck ROM: Full    Dental no notable dental hx. (+) Dental Advisory Given, Teeth Intact   Pulmonary neg pulmonary ROS,    Pulmonary exam normal breath sounds clear to auscultation       Cardiovascular hypertension, Pt. on home beta blockers and Pt. on medications +CHF  negative cardio ROS Normal cardiovascular exam+ dysrhythmias Atrial Fibrillation + Valvular Problems/Murmurs  Rhythm:Regular Rate:Normal  Impressions:   - The patient was in atrial fibrillation. Normal LV size with EF  45%, diffuse hypokinesis. Mildly dilated RV with mildly decreased  systolic function. Moderate tricuspid regurgitation. Moderate  pulmonary hypertension. Moderate biatrial enlargement.    Neuro/Psych PSYCHIATRIC DISORDERS Anxiety negative neurological ROS  negative psych ROS   GI/Hepatic negative GI ROS, Neg liver ROS,   Endo/Other  negative endocrine ROSdiabetes, Type 2  Renal/GU negative Renal ROS  negative genitourinary   Musculoskeletal negative musculoskeletal ROS (+) Arthritis , Osteoarthritis,    Abdominal   Peds negative pediatric ROS (+)  Hematology negative hematology ROS (+)   Anesthesia Other Findings   Reproductive/Obstetrics negative OB ROS                            Anesthesia Physical  Anesthesia Plan  ASA: III  Anesthesia Plan: MAC   Post-op Pain Management:  Regional for Post-op pain   Induction: Intravenous  PONV Risk Score and Plan: 2 and Ondansetron, Propofol infusion and Treatment may vary due to age or medical condition  Airway Management Planned: Natural  Airway, Nasal Cannula and Simple Face Mask  Additional Equipment:   Intra-op Plan:   Post-operative Plan:   Informed Consent: I have reviewed the patients History and Physical, chart, labs and discussed the procedure including the risks, benefits and alternatives for the proposed anesthesia with the patient or authorized representative who has indicated his/her understanding and acceptance.     Dental advisory given  Plan Discussed with: CRNA, Anesthesiologist and Surgeon  Anesthesia Plan Comments:        Anesthesia Quick Evaluation

## 2020-09-01 ENCOUNTER — Encounter (HOSPITAL_BASED_OUTPATIENT_CLINIC_OR_DEPARTMENT_OTHER)
Admission: RE | Disposition: A | Discharge status: Home / Self Care | Payer: Self-pay | Source: Home / Self Care | Attending: Orthopedic Surgery

## 2020-09-01 ENCOUNTER — Ambulatory Visit (HOSPITAL_COMMUNITY): Payer: Medicare Other

## 2020-09-01 ENCOUNTER — Ambulatory Visit (HOSPITAL_BASED_OUTPATIENT_CLINIC_OR_DEPARTMENT_OTHER)
Admission: RE | Admit: 2020-09-01 | Discharge: 2020-09-01 | Disposition: A | Discharge status: Home / Self Care | Payer: Medicare Other | Attending: Orthopedic Surgery | Admitting: Orthopedic Surgery

## 2020-09-01 ENCOUNTER — Other Ambulatory Visit: Payer: Self-pay

## 2020-09-01 ENCOUNTER — Encounter (HOSPITAL_BASED_OUTPATIENT_CLINIC_OR_DEPARTMENT_OTHER): Payer: Self-pay | Admitting: Orthopedic Surgery

## 2020-09-01 ENCOUNTER — Ambulatory Visit (HOSPITAL_BASED_OUTPATIENT_CLINIC_OR_DEPARTMENT_OTHER): Payer: Medicare Other | Admitting: Anesthesiology

## 2020-09-01 DIAGNOSIS — I4821 Permanent atrial fibrillation: Secondary | ICD-10-CM | POA: Diagnosis not present

## 2020-09-01 DIAGNOSIS — I4891 Unspecified atrial fibrillation: Secondary | ICD-10-CM | POA: Diagnosis not present

## 2020-09-01 DIAGNOSIS — Y798 Miscellaneous orthopedic devices associated with adverse incidents, not elsewhere classified: Secondary | ICD-10-CM | POA: Insufficient documentation

## 2020-09-01 DIAGNOSIS — T84113A Breakdown (mechanical) of internal fixation device of bone of left forearm, initial encounter: Secondary | ICD-10-CM | POA: Insufficient documentation

## 2020-09-01 DIAGNOSIS — T8484XA Pain due to internal orthopedic prosthetic devices, implants and grafts, initial encounter: Secondary | ICD-10-CM | POA: Diagnosis not present

## 2020-09-01 DIAGNOSIS — Z472 Encounter for removal of internal fixation device: Secondary | ICD-10-CM | POA: Diagnosis not present

## 2020-09-01 DIAGNOSIS — T84210A Breakdown (mechanical) of internal fixation device of bones of hand and fingers, initial encounter: Secondary | ICD-10-CM | POA: Diagnosis not present

## 2020-09-01 DIAGNOSIS — Z419 Encounter for procedure for purposes other than remedying health state, unspecified: Secondary | ICD-10-CM

## 2020-09-01 DIAGNOSIS — E440 Moderate protein-calorie malnutrition: Secondary | ICD-10-CM | POA: Diagnosis not present

## 2020-09-01 DIAGNOSIS — Z969 Presence of functional implant, unspecified: Secondary | ICD-10-CM | POA: Diagnosis not present

## 2020-09-01 DIAGNOSIS — E785 Hyperlipidemia, unspecified: Secondary | ICD-10-CM | POA: Diagnosis not present

## 2020-09-01 HISTORY — PX: MINOR HARDWARE REMOVAL: SHX6474

## 2020-09-01 LAB — GLUCOSE, CAPILLARY
Glucose-Capillary: 173 mg/dL — ABNORMAL HIGH (ref 70–99)
Glucose-Capillary: 239 mg/dL — ABNORMAL HIGH (ref 70–99)

## 2020-09-01 SURGERY — MINOR HARDWARE REMOVAL
Anesthesia: Monitor Anesthesia Care | Site: Wrist | Laterality: Left

## 2020-09-01 MED ORDER — CEFAZOLIN SODIUM-DEXTROSE 2-4 GM/100ML-% IV SOLN
INTRAVENOUS | Status: AC
  Filled 2020-09-01: qty 100

## 2020-09-01 MED ORDER — ONDANSETRON HCL 4 MG/2ML IJ SOLN
INTRAMUSCULAR | Status: AC
  Filled 2020-09-01: qty 2

## 2020-09-01 MED ORDER — ONDANSETRON HCL 4 MG/2ML IJ SOLN: 4.0000 mg | Freq: Once | INTRAMUSCULAR | Status: DC | PRN

## 2020-09-01 MED ORDER — FENTANYL CITRATE (PF) 100 MCG/2ML IJ SOLN
INTRAMUSCULAR | Status: AC
  Filled 2020-09-01: qty 2

## 2020-09-01 MED ORDER — ROPIVACAINE HCL 7.5 MG/ML IJ SOLN
INTRAMUSCULAR | Status: DC | PRN
  Administered 2020-09-01: 20 mL via PERINEURAL

## 2020-09-01 MED ORDER — ACETAMINOPHEN 160 MG/5ML PO SOLN
325.0000 mg | ORAL | Status: DC | PRN
Start: 2020-09-01 — End: 2020-09-01

## 2020-09-01 MED ORDER — PHENYLEPHRINE 40 MCG/ML (10ML) SYRINGE FOR IV PUSH (FOR BLOOD PRESSURE SUPPORT)
PREFILLED_SYRINGE | INTRAVENOUS | Status: DC | PRN
  Administered 2020-09-01: 80 ug via INTRAVENOUS

## 2020-09-01 MED ORDER — FENTANYL CITRATE (PF) 100 MCG/2ML IJ SOLN
100.0000 ug | Freq: Once | INTRAMUSCULAR | Status: AC
  Administered 2020-09-01: 50 ug via INTRAVENOUS

## 2020-09-01 MED ORDER — FENTANYL CITRATE (PF) 100 MCG/2ML IJ SOLN: 25.0000 ug | INTRAMUSCULAR | Status: DC | PRN

## 2020-09-01 MED ORDER — CEFAZOLIN SODIUM-DEXTROSE 2-4 GM/100ML-% IV SOLN
2.0000 g | INTRAVENOUS | Status: AC
  Administered 2020-09-01: 2 g via INTRAVENOUS

## 2020-09-01 MED ORDER — OXYCODONE HCL 5 MG PO TABS: 5.0000 mg | ORAL_TABLET | Freq: Once | ORAL | Status: DC | PRN

## 2020-09-01 MED ORDER — MEPERIDINE HCL 25 MG/ML IJ SOLN: 6.2500 mg | INTRAMUSCULAR | Status: DC | PRN

## 2020-09-01 MED ORDER — MIDAZOLAM HCL 2 MG/2ML IJ SOLN
INTRAMUSCULAR | Status: AC
  Filled 2020-09-01: qty 2

## 2020-09-01 MED ORDER — DEXAMETHASONE SODIUM PHOSPHATE 10 MG/ML IJ SOLN
INTRAMUSCULAR | Status: DC | PRN
  Administered 2020-09-01: 5 mg

## 2020-09-01 MED ORDER — PROPOFOL 10 MG/ML IV BOLUS
INTRAVENOUS | Status: DC | PRN
  Administered 2020-09-01 (×10): 10 mg via INTRAVENOUS

## 2020-09-01 MED ORDER — ACETAMINOPHEN 325 MG PO TABS: 325.0000 mg | ORAL_TABLET | ORAL | Status: DC | PRN

## 2020-09-01 MED ORDER — ONDANSETRON HCL 4 MG/2ML IJ SOLN
4.0000 mg | Freq: Once | INTRAMUSCULAR | Status: AC
  Administered 2020-09-01: 4 mg via INTRAVENOUS

## 2020-09-01 MED ORDER — OXYCODONE HCL 5 MG/5ML PO SOLN: 5.0000 mg | Freq: Once | ORAL | Status: DC | PRN

## 2020-09-01 MED ORDER — LACTATED RINGERS IV SOLN: INTRAVENOUS | Status: DC

## 2020-09-01 SURGICAL SUPPLY — 64 items
APL PRP STRL LF DISP 70% ISPRP (MISCELLANEOUS) ×1
BAND INSRT 18 STRL LF DISP RB (MISCELLANEOUS)
BAND RUBBER #18 3X1/16 STRL (MISCELLANEOUS) IMPLANT
BANDAGE ESMARK 6X9 LF (GAUZE/BANDAGES/DRESSINGS) IMPLANT
BLADE MINI RND TIP GREEN BEAV (BLADE) IMPLANT
BLADE SURG 15 STRL LF DISP TIS (BLADE) ×2 IMPLANT
BLADE SURG 15 STRL SS (BLADE) ×2
BNDG CMPR 9X4 STRL LF SNTH (GAUZE/BANDAGES/DRESSINGS) ×1
BNDG CMPR 9X6 STRL LF SNTH (GAUZE/BANDAGES/DRESSINGS)
BNDG COHESIVE 2X5 TAN STRL LF (GAUZE/BANDAGES/DRESSINGS) IMPLANT
BNDG COHESIVE 4X5 TAN STRL (GAUZE/BANDAGES/DRESSINGS) ×2 IMPLANT
BNDG ESMARK 4X9 LF (GAUZE/BANDAGES/DRESSINGS) ×1 IMPLANT
BNDG ESMARK 6X9 LF (GAUZE/BANDAGES/DRESSINGS)
BNDG GAUZE 1X2.1 STRL (MISCELLANEOUS) IMPLANT
BNDG GAUZE ELAST 4 BULKY (GAUZE/BANDAGES/DRESSINGS) ×2 IMPLANT
CHLORAPREP W/TINT 26 (MISCELLANEOUS) ×2 IMPLANT
CORD BIPOLAR FORCEPS 12FT (ELECTRODE) ×1 IMPLANT
COVER BACK TABLE 60X90IN (DRAPES) ×2 IMPLANT
COVER MAYO STAND STRL (DRAPES) ×2 IMPLANT
COVER WAND RF STERILE (DRAPES) IMPLANT
CUFF TOURN SGL QUICK 18X4 (TOURNIQUET CUFF) ×1 IMPLANT
CUFF TOURN SGL QUICK 34 (TOURNIQUET CUFF)
CUFF TRNQT CYL 34X4.125X (TOURNIQUET CUFF) IMPLANT
DRAIN PENROSE 1/2X12 LTX STRL (WOUND CARE) IMPLANT
DRAPE C-ARM 42X72 X-RAY (DRAPES) ×1 IMPLANT
DRAPE EXTREMITY T 121X128X90 (DISPOSABLE) ×2 IMPLANT
DRAPE OEC MINIVIEW 54X84 (DRAPES) IMPLANT
DRAPE SURG 17X23 STRL (DRAPES) ×2 IMPLANT
DRSG EMULSION OIL 3X3 NADH (GAUZE/BANDAGES/DRESSINGS) ×1 IMPLANT
DRSG PAD ABDOMINAL 8X10 ST (GAUZE/BANDAGES/DRESSINGS) IMPLANT
ELECT REM PT RETURN 9FT ADLT (ELECTROSURGICAL)
ELECTRODE REM PT RTRN 9FT ADLT (ELECTROSURGICAL) IMPLANT
GAUZE SPONGE 4X4 12PLY STRL LF (GAUZE/BANDAGES/DRESSINGS) ×2 IMPLANT
GLOVE SRG 8 PF TXTR STRL LF DI (GLOVE) ×1 IMPLANT
GLOVE SURG ENC MOIS LTX SZ7.5 (GLOVE) ×2 IMPLANT
GLOVE SURG LTX SZ6.5 (GLOVE) ×2 IMPLANT
GLOVE SURG UNDER POLY LF SZ7 (GLOVE) ×2 IMPLANT
GLOVE SURG UNDER POLY LF SZ8 (GLOVE) ×2
GOWN STRL REUS W/ TWL LRG LVL3 (GOWN DISPOSABLE) ×2 IMPLANT
GOWN STRL REUS W/TWL LRG LVL3 (GOWN DISPOSABLE) ×4
GOWN STRL REUS W/TWL XL LVL3 (GOWN DISPOSABLE) ×2 IMPLANT
NDL HYPO 25X1 1.5 SAFETY (NEEDLE) IMPLANT
NEEDLE HYPO 25X1 1.5 SAFETY (NEEDLE) IMPLANT
NS IRRIG 1000ML POUR BTL (IV SOLUTION) ×2 IMPLANT
PACK BASIN DAY SURGERY FS (CUSTOM PROCEDURE TRAY) ×2 IMPLANT
PADDING CAST ABS 4INX4YD NS (CAST SUPPLIES) ×1
PADDING CAST ABS COTTON 4X4 ST (CAST SUPPLIES) IMPLANT
PENCIL SMOKE EVACUATOR (MISCELLANEOUS) IMPLANT
SLING ARM FOAM STRAP MED (SOFTGOODS) ×1 IMPLANT
STOCKINETTE 6  STRL (DRAPES) ×2
STOCKINETTE 6 STRL (DRAPES) ×1 IMPLANT
SUCTION FRAZIER HANDLE 10FR (MISCELLANEOUS) ×2
SUCTION TUBE FRAZIER 10FR DISP (MISCELLANEOUS) IMPLANT
SUT ETHILON 3 0 PS 1 (SUTURE) IMPLANT
SUT VIC AB 2-0 PS2 27 (SUTURE) IMPLANT
SUT VICRYL RAPIDE 4-0 (SUTURE) IMPLANT
SUT VICRYL RAPIDE 4/0 PS 2 (SUTURE) ×1 IMPLANT
SWAB COLLECTION DEVICE MRSA (MISCELLANEOUS) IMPLANT
SWAB CULTURE ESWAB REG 1ML (MISCELLANEOUS) IMPLANT
SYR 10ML LL (SYRINGE) IMPLANT
SYR BULB EAR ULCER 3OZ GRN STR (SYRINGE) ×2 IMPLANT
TOWEL GREEN STERILE FF (TOWEL DISPOSABLE) ×2 IMPLANT
TUBE CONNECTING 20X1/4 (TUBING) ×1 IMPLANT
UNDERPAD 30X36 HEAVY ABSORB (UNDERPADS AND DIAPERS) ×2 IMPLANT

## 2020-09-01 NOTE — Progress Notes (Signed)
Assisted Dr. Oddono with left, ultrasound guided, supraclavicular block. Side rails up, monitors on throughout procedure. See vital signs in flow sheet. Tolerated Procedure well. 

## 2020-09-01 NOTE — Anesthesia Procedure Notes (Signed)
Anesthesia Regional Block: Supraclavicular block   Pre-Anesthetic Checklist: ,, timeout performed, Correct Patient, Correct Site, Correct Laterality, Correct Procedure, Correct Position, site marked, Risks and benefits discussed,  Surgical consent,  Pre-op evaluation,  At surgeon's request and post-op pain management  Laterality: Left  Prep: chloraprep       Needles:  Injection technique: Single-shot  Needle Type: Echogenic Stimulator Needle     Needle Length: 5cm  Needle Gauge: 22     Additional Needles:   Procedures:, nerve stimulator,,, ultrasound used (permanent image in chart),,,,   Nerve Stimulator or Paresthesia:  Response: hand, 0.45 mA,   Additional Responses:   Narrative:  Start time: 09/01/2020 8:18 AM End time: 09/01/2020 8:26 AM Injection made incrementally with aspirations every 5 mL.  Performed by: Personally  Anesthesiologist: Janeece Riggers, MD  Additional Notes: Functioning IV was confirmed and monitors were applied.  A 34mm 22ga Arrow echogenic stimulator needle was used. Sterile prep and drape,hand hygiene and sterile gloves were used. Ultrasound guidance: relevant anatomy identified, needle position confirmed, local anesthetic spread visualized around nerve(s)., vascular puncture avoided.  Image printed for medical record. Negative aspiration and negative test dose prior to incremental administration of local anesthetic. The patient tolerated the procedure well.

## 2020-09-01 NOTE — Anesthesia Postprocedure Evaluation (Signed)
Anesthesia Post Note  Patient: Tara Bradley  Procedure(s) Performed: REMOVAL OF HARDWARE LEFT FOREARM (Left Wrist)     Patient location during evaluation: PACU Anesthesia Type: MAC Level of consciousness: awake and alert Pain management: pain level controlled Vital Signs Assessment: post-procedure vital signs reviewed and stable Respiratory status: spontaneous breathing, nonlabored ventilation, respiratory function stable and patient connected to nasal cannula oxygen Cardiovascular status: stable and blood pressure returned to baseline Postop Assessment: no apparent nausea or vomiting Anesthetic complications: no   No complications documented.  Last Vitals:  Vitals:   09/01/20 1045 09/01/20 1200  BP: 100/64 110/78  Pulse: 84 89  Resp: 17 16  Temp:  36.6 C  SpO2: 95% 95%    Last Pain:  Vitals:   09/01/20 1200  TempSrc:   PainSc: 0-No pain                 Connell Bognar

## 2020-09-01 NOTE — Anesthesia Procedure Notes (Signed)
Procedure Name: MAC Date/Time: 09/01/2020 9:22 AM Performed by: Lieutenant Diego, CRNA Pre-anesthesia Checklist: Patient identified, Emergency Drugs available, Suction available, Patient being monitored and Timeout performed Patient Re-evaluated:Patient Re-evaluated prior to induction Oxygen Delivery Method: Simple face mask Preoxygenation: Pre-oxygenation with 100% oxygen Induction Type: IV induction

## 2020-09-01 NOTE — Transfer of Care (Signed)
Immediate Anesthesia Transfer of Care Note  Patient: Tara Bradley  Procedure(s) Performed: REMOVAL OF HARDWARE LEFT FOREARM (Left Wrist)  Patient Location: PACU  Anesthesia Type:MAC  Level of Consciousness: awake  Airway & Oxygen Therapy: Patient Spontanous Breathing  Post-op Assessment: Report given to RN and Post -op Vital signs reviewed and stable  Post vital signs: Reviewed and stable  Last Vitals:  Vitals Value Taken Time  BP 104/60 09/01/20 1008  Temp    Pulse 46 09/01/20 1009  Resp 20 09/01/20 1009  SpO2 94 % 09/01/20 1009  Vitals shown include unvalidated device data.  Last Pain:  Vitals:   09/01/20 0747  TempSrc: Oral  PainSc: 3       Patients Stated Pain Goal: 5 (91/67/56 1254)  Complications: No complications documented.

## 2020-09-01 NOTE — Op Note (Signed)
09/01/2020  7:50 AM  PATIENT:  [name]  87 y.o. female  PRE-OPERATIVE DIAGNOSIS: Retained fractured hardware of left forearm  POST-OPERATIVE DIAGNOSIS:  Same  PROCEDURE: Removal of broken plate and screws from left wrist and forearm  SURGEON: Rayvon Char. Grandville Silos, MD  PHYSICIAN ASSISTANT: Morley Kos, OPA-C  ANESTHESIA:  regional and MAC  SPECIMENS:  None  DRAINS:   None  EBL:  less than 50 mL  PREOPERATIVE INDICATIONS:  SKYA MCCULLUM is a  87 y.o. female with history of a comminuted distal radius fracture treated with a bridge plate, that has subsequently fractured and become more symptomatic, prompting desire for removal  The risks benefits and alternatives were discussed with the patient preoperatively including but not limited to the risks of infection, bleeding, nerve injury, cardiopulmonary complications, the need for revision surgery, among others, and the patient verbalized understanding and consented to proceed.  OPERATIVE IMPLANTS: Broken plate and screws removed  OPERATIVE PROCEDURE:  After receiving prophylactic antibiotics, and a regional block, the patient was escorted to the operative theatre and placed in a supine position.  A surgical "time-out" was performed during which the planned procedure, proposed operative site, and the correct patient identity were compared to the operative consent and agreement confirmed by the circulating nurse according to current facility policy.  Following application of a tourniquet to the operative extremity, the exposed skin was prepped with Chloraprep and draped in the usual sterile fashion.  The limb was exsanguinated with an Esmarch bandage and the tourniquet inflated to approximately 136mHg higher than systolic BP.  Using fluoroscopic guidance, the plate and screws were identified, and 3 windows for access were marked, ultimately creating just 3 incisions to remove the plate.  The distal one was centered over one of  the screws, and spreading dissection was carried down to the plate, using this 1 most of the distal screws were removed.  Then there was a window that was over top of the fracture of the plate and through this 1, the proximal screw of the distal plate fragment was removed and the distal plate was grasped and removed.  There was a proximal incision that was used to remove the screws in a similar fashion from the proximal portion of the plate and then that plate was also removed through the middle incision.  Once the hardware was removed, final images were obtained revealing no untoward events or fractures that occurred.  There was actually much enhanced range of motion of the wrist after the fragments were removed.  Tourniquet was released some hemostasis obtained with direct pressure and the skin was closed with 4-0 Vicryl Rapide horizontal mattress sutures, combination of interrupted and running, leaving a portion of each incision less fully closed to allow for postoperative drainage to alleviate hematoma.  A bulky supportive dressing was applied and she was taken to the recovery room in stable condition.  DISPOSITION: She will be discharged home today, returning in a couple weeks for reassessment without x-rays.

## 2020-09-01 NOTE — Interval H&P Note (Signed)
History and Physical Interval Note:  09/01/2020 7:50 AM  Tara Bradley  has presented today for surgery, with the diagnosis of LEFT FOREARM RETAINED HARDWARE.  The various methods of treatment have been discussed with the patient and family. After consideration of risks, benefits and other options for treatment, the patient has consented to  Procedure(s): REMOVAL OF HARDWARE LEFT FOREARM (Left) as a surgical intervention.  The patient's history has been reviewed, patient examined, no change in status, stable for surgery.  I have reviewed the patient's chart and labs.  Questions were answered to the patient's satisfaction.     Jolyn Nap

## 2020-09-01 NOTE — Discharge Instructions (Signed)
Discharge Instructions   You have a light dressing on your hand.  You may begin gentle motion of your fingers and hand immediately, but you should not do any heavy lifting or gripping.  Elevate your hand to reduce pain & swelling of the digits.  Ice over the operative site may be helpful to reduce pain & swelling.  DO NOT USE HEAT. Take Tylenol 650 mg every 6 hours for pain.  Leave the dressing in place until the third day after your surgery and then remove it, leaving it open to air.  After the bandage has been removed you may shower, regularly washing the incision and letting the water run over it, but not submerging it (no swimming, soaking it in dishwater, etc.) You may drive a car when you are off of prescription pain medications and can safely control your vehicle with both hands. We will address whether therapy will be required or not when you return to the office. You may have already made your follow-up appointment when we completed your preop visit.  If not, please call our office today or the next business day to make your return appointment for 10-15 days after surgery.   Please call [phone] during normal business hours or [phone] after hours for any problems. Including the following:  - excessive redness of the incisions - drainage for more than 4 days - fever of more than 101.5 F  *Please note that pain medications will not be refilled after hours or on weekends.   Post Anesthesia Home Care Instructions  Activity: Get plenty of rest for the remainder of the day. A responsible individual must stay with you for 24 hours following the procedure.  For the next 24 hours, DO NOT: -Drive a car -Paediatric nurse -Drink alcoholic beverages -Take any medication unless instructed by your physician -Make any legal decisions or sign important papers.  Meals: Start with liquid foods such as gelatin or soup. Progress to regular foods as tolerated. Avoid greasy, spicy,  heavy foods. If nausea and/or vomiting occur, drink only clear liquids until the nausea and/or vomiting subsides. Call your physician if vomiting continues.  Special Instructions/Symptoms: Your throat may feel dry or sore from the anesthesia or the breathing tube placed in your throat during surgery. If this causes discomfort, gargle with warm salt water. The discomfort should disappear within 24 hours.  If you had a scopolamine patch placed behind your ear for the management of post- operative nausea and/or vomiting:  1. The medication in the patch is effective for 72 hours, after which it should be removed.  Wrap patch in a tissue and discard in the trash. Wash hands thoroughly with soap and water. 2. You may remove the patch earlier than 72 hours if you experience unpleasant side effects which may include dry mouth, dizziness or visual disturbances. 3. Avoid touching the patch. Wash your hands with soap and water after contact with the patch.    Regional Anesthesia Blocks  1. Numbness or the inability to move the "blocked" extremity may last from 3-48 hours after placement. The length of time depends on the medication injected and your individual response to the medication. If the numbness is not going away after 48 hours, call your surgeon.  2. The extremity that is blocked will need to be protected until the numbness is gone and the  Strength has returned. Because you cannot feel it, you will need to take extra care to avoid injury. Because it may be weak, you  may have difficulty moving it or using it. You may not know what position it is in without looking at it while the block is in effect.  3. For blocks in the legs and feet, returning to weight bearing and walking needs to be done carefully. You will need to wait until the numbness is entirely gone and the strength has returned. You should be able to move your leg and foot normally before you try and bear weight or walk. You will need  someone to be with you when you first try to ensure you do not fall and possibly risk injury.  4. Bruising and tenderness at the needle site are common side effects and will resolve in a few days.  5. Persistent numbness or new problems with movement should be communicated to the surgeon or the [place] [phone] [place] [phone]).

## 2020-09-02 ENCOUNTER — Encounter (HOSPITAL_BASED_OUTPATIENT_CLINIC_OR_DEPARTMENT_OTHER): Payer: Self-pay | Admitting: Orthopedic Surgery

## 2020-09-10 ENCOUNTER — Other Ambulatory Visit (HOSPITAL_COMMUNITY): Payer: Self-pay

## 2020-09-11 ENCOUNTER — Other Ambulatory Visit: Payer: Self-pay

## 2020-09-11 ENCOUNTER — Ambulatory Visit (HOSPITAL_COMMUNITY)
Admission: RE | Admit: 2020-09-11 | Discharge: 2020-09-11 | Disposition: A | Discharge status: Home / Self Care | Payer: Medicare Other | Source: Ambulatory Visit | Attending: Internal Medicine | Admitting: Internal Medicine

## 2020-09-11 DIAGNOSIS — M810 Age-related osteoporosis without current pathological fracture: Secondary | ICD-10-CM | POA: Insufficient documentation

## 2020-09-11 MED ORDER — ZOLEDRONIC ACID 5 MG/100ML IV SOLN
5.0000 mg | Freq: Once | INTRAVENOUS | Status: AC
  Administered 2020-09-11: 5 mg via INTRAVENOUS

## 2020-09-11 MED ORDER — ZOLEDRONIC ACID 5 MG/100ML IV SOLN
INTRAVENOUS | Status: AC
  Filled 2020-09-11: qty 100

## 2020-09-15 NOTE — Progress Notes (Signed)
Cardiology Office Note   Date:  09/16/2020   ID:  BRECKEN WALTH, DOB 22-Dec-1922, MRN [id number]  PCP:  [name], MD  Cardiologist: Dr. Percival Spanish, 08/15/2017 [name], MD 05/26/2017   Chief Complaint  Patient presents with  . Atrial Fibrillation    History of Present Illness: Tara Bradley is a 87 y.o. female with a history of an aortic thrombosed descending aortic dissection, aortic valve sclerosis and chronic afib on Xarelto.  Since I last saw her she had her wrist repaired.  She denies any new cardiovascular symptoms.  She walks with a cane or a walker just to keep steady.  She reads.  She denies any cardiovascular symptoms. The patient denies any new symptoms such as chest discomfort, neck or arm discomfort. There has been no new shortness of breath, PND or orthopnea. There have been no reported palpitations, presyncope or syncope.    Past Medical History:  Diagnosis Date  . Aortic valve sclerosis    Echo, 2008  . Arthritis   . Bradycardia    October, 2012  . Cancer ([place])   . Chronic insomnia   . Closed fracture of unspecified part of femur 2005  . Diabetes mellitus, type 2 ([place])   . Diverticulitis   . Diverticulosis   . Ejection fraction    EF 60%, echo, February, 2008  //   EF 65-70%, echo, November, 2012  . Hyperlipidemia   . Hypertension   . Long term (current) use of anticoagulants   . Osteoporosis    femur fracture 2005, pelvic fracture 2006  . Persistent atrial fibrillation ([place])   . Personal history of malignant neoplasm of breast   . Syncope   . Thoracic aortic aneurysm ([place]) 02/2017  . Unspecified closed fracture of pelvis 2006    Past Surgical History:  Procedure Laterality Date  . CARDIOVERSION N/A 08/09/2012   Procedure: CARDIOVERSION;  Surgeon: Deboraha Sprang, MD;  Location: [facility];  Service: Cardiovascular;  Laterality: N/A;  . EYE SURGERY    . FEMUR SURGERY  2005   ORIF  . IR ANGIOGRAM PELVIS SELECTIVE OR SUPRASELECTIVE   03/11/2017  . IR ANGIOGRAM SELECTIVE EACH ADDITIONAL VESSEL  03/11/2017  . IR EMBO ART  VEN HEMORR LYMPH EXTRAV  INC GUIDE ROADMAPPING  03/11/2017  . IR FLUORO GUIDE CV LINE RIGHT  03/11/2017  . IR US GUIDE VASC ACCESS RIGHT  03/11/2017  . IR US GUIDE VASC ACCESS RIGHT  03/11/2017  . MASTECTOMY  1995   left  . MINOR HARDWARE REMOVAL Left 09/01/2020   Procedure: REMOVAL OF HARDWARE LEFT FOREARM;  Surgeon: Milly Jakob, MD;  Location: [facility];  Service: Orthopedics;  Laterality: Left;  . ORIF ULNAR FRACTURE Left 10/19/2019   Procedure: OPEN REDUCTION INTERNAL FIXATION (ORIF)  LEFT DISTAL RADIUS  AND ULNA FRACTURE;  Surgeon: Milly Jakob, MD;  Location: [facility];  Service: Orthopedics;  Laterality: Left;  . SHOULDER SURGERY     left  . [place], 2003  . TONSILLECTOMY    . TOTAL ABDOMINAL HYSTERECTOMY W/ BILATERAL SALPINGOOPHORECTOMY  1995  . WRIST SURGERY      x 2     Prior to Admission medications   Medication Sig Start Date End Date Taking? Authorizing Provider  acetaminophen (TYLENOL) 325 MG tablet Take 2 tablets (650 mg total) by mouth every 6 (six) hours. 10/20/19  Yes Milly Jakob, MD  Calcium Carbonate-Vitamin D 600-400 MG-UNIT tablet Take 1 tablet by mouth  daily.   Yes [provider]  Cholecalciferol (VITAMIN D) 2000 UNITS tablet Take 1,000 Units by mouth daily.   Yes [provider]  digoxin (LANOXIN) 0.125 MG tablet TAKE (1/2) TABLET DAILY. 03/31/20  Yes [name], MD  diltiazem (CARDIZEM CD) 300 MG 24 hr capsule TAKE 1 CAPSULE EVERY DAY. 07/28/20  Yes [name], MD  furosemide (LASIX) 40 MG tablet Take 40 mg by mouth daily.    Yes [provider]  losartan-hydrochlorothiazide (HYZAAR) 100-12.5 MG tablet Take 1 tablet by mouth every morning. 09/27/17  Yes [provider]  metFORMIN (GLUMETZA) 500 MG (MOD) 24 hr tablet Take 1,000 mg by mouth every evening.   Yes [provider]  metoprolol  tartrate (LOPRESSOR) 25 MG tablet TAKE 1 TABLET BY MOUTH TWICE DAILY. 03/17/20  Yes [name], MD  potassium chloride (K-DUR) 10 MEQ tablet Take 10 mEq by mouth daily.  03/25/15  Yes [provider]  temazepam (RESTORIL) 15 MG capsule Take by mouth in the morning and at bedtime. 12/28/19  Yes [provider]  XARELTO 15 MG TABS tablet TAKE 1 TABLET ONCE DAILY WITH SUPPER. 06/11/20  Yes Campbell Kray, Jeneen Rinks, MD    Allergies:   Ambien [zolpidem tartrate], Codeine, Codeine sulfate, and Latex    ROS:  Positive for none. All other systems are reviewed and negative.   PHYSICAL EXAM: VS:  BP 124/60   Pulse 87   Ht 5\' 3"  (1.6 m)   Wt 136 lb 12.8 oz (62.1 kg)   SpO2 91%   BMI 24.23 kg/m  , BMI Body mass index is 24.23 kg/m. GENERAL:  Well appearing NECK:  No jugular venous distention, waveform within normal limits, carotid upstroke brisk and symmetric, no bruits, no thyromegaly LUNGS:  Clear to auscultation bilaterally CHEST:  Unremarkable HEART:  PMI not displaced or sustained,S1 and S2 within normal limits, no S3, no clicks, no rubs, no murmurs, irregular  ABD:  Flat, positive bowel sounds normal in frequency in pitch, no bruits, no rebound, no guarding, no midline pulsatile mass, no hepatomegaly, no splenomegaly EXT:  2 plus pulses throughout, no edema, no cyanosis no clubbing   EKG:  EKG is ordered today. Atrial fibrillation, rate 87, axis within normal limits, intervals within normal limits, old anteroseptal infarct.  Recent Labs: 10/19/2019: Hemoglobin 14.7; Platelets 206 08/26/2020: BUN 17; Creatinine, Ser 0.94; Potassium 3.8; Sodium 134    Lipid Panel    Component Value Date/Time   CHOL 132 03/13/2017 0431   TRIG 72 03/13/2017 0431   HDL 48 03/13/2017 0431   CHOLHDL 2.8 03/13/2017 0431   VLDL 14 03/13/2017 0431   LDLCALC 70 03/13/2017 0431   LDLDIRECT 37 03/10/2009 1106     Wt Readings from Last 3 Encounters:  09/16/20 136 lb 12.8 oz (62.1 kg)  09/11/20  135 lb (61.2 kg)  09/01/20 135 lb 2.3 oz (61.3 kg)     Other studies Reviewed: Additional studies/ records that were reviewed today include:  Hospital records.   ASSESSMENT AND PLAN  Permanent atrial fibrillation:    Ms. MAYMUNAH STEGEMANN has a CHA2DS2 - VASc score of 5.    She did have mildly elevated heart rates in the past but these seem to be better controlled.  No change in therapy.  She tolerates anticoagulation.  Chronic systolic and diastolic CHF:    She seems to be euvolemic.  No change in therapy.  She did have a mildly reduced ejection fraction in 2018.  There was  a question of some issue postop that I cannot find documented with might of been a low oxygen saturation.  I would like to follow-up with an echocardiogram as we have not done one in about 4 years.  Aortic aneurysm with chronic descending dissection:     We are managing this conservatively.  No change in therapy.  Current medicines are reviewed at length with the patient today.  The patient does not have concerns regarding medicines.  The following changes have been made:   None  Labs/ tests ordered today include:   Orders Placed This Encounter  Procedures  . EKG 12-Lead  . ECHOCARDIOGRAM COMPLETE     Disposition:    Follow up in 12 months  Signed, [name], MD  09/16/2020 3:06 PM    [facility]

## 2020-09-16 ENCOUNTER — Encounter: Payer: Self-pay | Admitting: Cardiology

## 2020-09-16 ENCOUNTER — Ambulatory Visit: Payer: Medicare Other | Admitting: Cardiology

## 2020-09-16 ENCOUNTER — Other Ambulatory Visit: Payer: Self-pay

## 2020-09-16 VITALS — BP 124/60 | HR 87 | Ht 63.0 in | Wt 136.8 lb

## 2020-09-16 DIAGNOSIS — I482 Chronic atrial fibrillation, unspecified: Secondary | ICD-10-CM

## 2020-09-16 DIAGNOSIS — I5032 Chronic diastolic (congestive) heart failure: Secondary | ICD-10-CM

## 2020-09-16 NOTE — Patient Instructions (Signed)
Medication Instructions:  Your physician recommends that you continue on your current medications as directed. Please refer to the Current Medication list given to you today.  *If you need a refill on your cardiac medications before your next appointment, please call your pharmacy*   Lab Work: NONE ordered at this time of appointment   If you have labs (blood work) drawn today and your tests are completely normal, you will receive your results only by: [name] MyChart Message (if you have MyChart) OR . A paper copy in the mail If you have any lab test that is abnormal or we need to change your treatment, we will call you to review the results.   Testing/Procedures: Your physician has requested that you have an echocardiogram. Echocardiography is a painless test that uses sound waves to create images of your heart. It provides your doctor with information about the size and shape of your heart and how well your heart's chambers and valves are working. This procedure takes approximately one hour. There are no restrictions for this procedure.   Please schedule for 1-2 months    Follow-Up: At [facility], you and your health needs are our priority.  As part of our continuing mission to provide you with exceptional heart care, we have created designated Provider Care Teams.  These Care Teams include your primary Cardiologist (physician) and Advanced Practice Providers (APPs -  Physician Assistants and Nurse Practitioners) who all work together to provide you with the care you need, when you need it.  We recommend signing up for the patient portal called "MyChart".  Sign up information is provided on this After Visit Summary.  MyChart is used to connect with patients for Virtual Visits (Telemedicine).  Patients are able to view lab/test results, encounter notes, upcoming appointments, etc.  Non-urgent messages can be sent to your provider as well.   To learn more about what you can do with MyChart,  go to [email or web address].    Your next appointment:   6 month(s)  The format for your next appointment:   In Person  Provider:   [name], MD  Other Instructions

## 2020-09-23 DIAGNOSIS — L821 Other seborrheic keratosis: Secondary | ICD-10-CM | POA: Diagnosis not present

## 2020-09-23 DIAGNOSIS — C4441 Basal cell carcinoma of skin of scalp and neck: Secondary | ICD-10-CM | POA: Diagnosis not present

## 2020-09-23 DIAGNOSIS — D485 Neoplasm of uncertain behavior of skin: Secondary | ICD-10-CM | POA: Diagnosis not present

## 2020-09-23 DIAGNOSIS — L570 Actinic keratosis: Secondary | ICD-10-CM | POA: Diagnosis not present

## 2020-09-23 DIAGNOSIS — L718 Other rosacea: Secondary | ICD-10-CM | POA: Diagnosis not present

## 2020-09-26 ENCOUNTER — Encounter (HOSPITAL_COMMUNITY): Payer: Self-pay

## 2020-09-26 ENCOUNTER — Emergency Department (HOSPITAL_COMMUNITY)
Admission: EM | Admit: 2020-09-26 | Discharge: 2020-09-26 | Disposition: A | Discharge status: Home / Self Care | Payer: Medicare Other | Attending: Emergency Medicine | Admitting: Emergency Medicine

## 2020-09-26 ENCOUNTER — Other Ambulatory Visit: Payer: Self-pay

## 2020-09-26 DIAGNOSIS — Z7901 Long term (current) use of anticoagulants: Secondary | ICD-10-CM | POA: Insufficient documentation

## 2020-09-26 DIAGNOSIS — I509 Heart failure, unspecified: Secondary | ICD-10-CM | POA: Diagnosis not present

## 2020-09-26 DIAGNOSIS — S61217A Laceration without foreign body of left little finger without damage to nail, initial encounter: Secondary | ICD-10-CM | POA: Diagnosis not present

## 2020-09-26 DIAGNOSIS — S6992XA Unspecified injury of left wrist, hand and finger(s), initial encounter: Secondary | ICD-10-CM | POA: Diagnosis present

## 2020-09-26 DIAGNOSIS — Z79899 Other long term (current) drug therapy: Secondary | ICD-10-CM | POA: Diagnosis not present

## 2020-09-26 DIAGNOSIS — Z7984 Long term (current) use of oral hypoglycemic drugs: Secondary | ICD-10-CM | POA: Insufficient documentation

## 2020-09-26 DIAGNOSIS — I110 Hypertensive heart disease with heart failure: Secondary | ICD-10-CM | POA: Diagnosis not present

## 2020-09-26 DIAGNOSIS — W268XXA Contact with other sharp object(s), not elsewhere classified, initial encounter: Secondary | ICD-10-CM | POA: Insufficient documentation

## 2020-09-26 DIAGNOSIS — Z91040 Latex allergy status: Secondary | ICD-10-CM | POA: Diagnosis not present

## 2020-09-26 DIAGNOSIS — Z853 Personal history of malignant neoplasm of breast: Secondary | ICD-10-CM | POA: Insufficient documentation

## 2020-09-26 DIAGNOSIS — E119 Type 2 diabetes mellitus without complications: Secondary | ICD-10-CM | POA: Insufficient documentation

## 2020-09-26 DIAGNOSIS — Z23 Encounter for immunization: Secondary | ICD-10-CM | POA: Insufficient documentation

## 2020-09-26 MED ORDER — TETANUS-DIPHTH-ACELL PERTUSSIS 5-2.5-18.5 LF-MCG/0.5 IM SUSY
0.5000 mL | PREFILLED_SYRINGE | Freq: Once | INTRAMUSCULAR | Status: AC
  Administered 2020-09-26: 0.5 mL via INTRAMUSCULAR
  Filled 2020-09-26: qty 0.5

## 2020-09-26 NOTE — ED Notes (Signed)
Laceration to L upper pinky finger, finger-pad side without nail involvement noted. Cleaned with wound spray and saline, wrapped with 4x4 and coban.

## 2020-09-26 NOTE — ED Triage Notes (Signed)
Patient states she was in a file cabinet yesterday and one of the plastic pieces on a file went into the left little finger "like a fish hook" Patient states she takes Xarelto and her left little fingetr has been bleeding since yesterday.
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
## 2020-09-26 NOTE — Discharge Instructions (Addendum)
Keep the dressing in place for the next day or 2.  After that continue to change the dressing daily.

## 2020-09-29 ENCOUNTER — Ambulatory Visit: Payer: Medicare Other | Admitting: Podiatry

## 2020-09-29 ENCOUNTER — Encounter: Payer: Self-pay | Admitting: Podiatry

## 2020-09-29 ENCOUNTER — Other Ambulatory Visit: Payer: Self-pay

## 2020-09-29 DIAGNOSIS — B351 Tinea unguium: Secondary | ICD-10-CM

## 2020-09-29 DIAGNOSIS — Z7901 Long term (current) use of anticoagulants: Secondary | ICD-10-CM | POA: Diagnosis not present

## 2020-09-29 DIAGNOSIS — D689 Coagulation defect, unspecified: Secondary | ICD-10-CM | POA: Diagnosis not present

## 2020-09-29 DIAGNOSIS — M79675 Pain in left toe(s): Secondary | ICD-10-CM | POA: Diagnosis not present

## 2020-09-29 DIAGNOSIS — M79674 Pain in right toe(s): Secondary | ICD-10-CM

## 2020-09-29 NOTE — Progress Notes (Signed)
This patient returns to my office for at risk foot care.  This patient requires this care by a professional since this patient will be at risk due to having coagulation defect and diabetes.  Patient is taking eliquis.   This patient is unable to cut nails herself since the patient cannot reach her nails.These nails are painful walking and wearing shoes.  This patient presents for at risk foot care today.  General Appearance  Alert, conversant and in no acute stress.  Vascular  Dorsalis pedis and posterior tibial  pulses are palpable  bilaterally.  Capillary return is within normal limits  bilaterally. Temperature is within normal limits  bilaterally.  Neurologic  Senn-Weinstein monofilament wire test within normal limits  bilaterally. Muscle power within normal limits bilaterally.  Nails Thick disfigured discolored nails with subungual debris  from hallux to fifth toes bilaterally. No evidence of bacterial infection or drainage bilaterally.  Orthopedic  No limitations of motion  feet .  No crepitus or effusions noted.  No bony pathology or digital deformities noted. Hammer toes  B/L.  Skin  normotropic skin with no porokeratosis noted bilaterally.  No signs of infections or ulcers noted.     Onychomycosis  Pain in right toes  Pain in left toes  Consent was obtained for treatment procedures.   Mechanical debridement of nails 1-5  bilaterally performed with a nail nipper.  Filed with dremel without incident.    Return office visit    3 months                  Told patient to return for periodic foot care and evaluation due to potential at risk complications.   Gardiner Barefoot DPM

## 2020-10-07 DIAGNOSIS — C4441 Basal cell carcinoma of skin of scalp and neck: Secondary | ICD-10-CM | POA: Diagnosis not present

## 2020-10-16 ENCOUNTER — Other Ambulatory Visit: Payer: Self-pay

## 2020-10-16 ENCOUNTER — Ambulatory Visit (HOSPITAL_COMMUNITY): Payer: Medicare Other | Attending: Cardiology

## 2020-10-16 DIAGNOSIS — I5032 Chronic diastolic (congestive) heart failure: Secondary | ICD-10-CM | POA: Insufficient documentation

## 2020-10-16 DIAGNOSIS — I482 Chronic atrial fibrillation, unspecified: Secondary | ICD-10-CM | POA: Insufficient documentation

## 2020-10-16 LAB — ECHOCARDIOGRAM COMPLETE
Area-P 1/2: 5.31 cm2
S' Lateral: 2.6 cm

## 2020-10-22 ENCOUNTER — Encounter: Payer: Self-pay | Admitting: *Deleted

## 2020-12-02 ENCOUNTER — Other Ambulatory Visit: Payer: Self-pay | Admitting: Cardiology

## 2020-12-02 NOTE — Telephone Encounter (Signed)
32f, 62.1kg, scr 0.94 08/26/20, lovw/hochrein 09/16/20 ccr 34.3

## 2020-12-17 DIAGNOSIS — Z794 Long term (current) use of insulin: Secondary | ICD-10-CM | POA: Diagnosis not present

## 2020-12-17 DIAGNOSIS — J9611 Chronic respiratory failure with hypoxia: Secondary | ICD-10-CM | POA: Diagnosis not present

## 2020-12-17 DIAGNOSIS — E782 Mixed hyperlipidemia: Secondary | ICD-10-CM | POA: Diagnosis not present

## 2020-12-17 DIAGNOSIS — I712 Thoracic aortic aneurysm, without rupture: Secondary | ICD-10-CM | POA: Diagnosis not present

## 2020-12-17 DIAGNOSIS — I10 Essential (primary) hypertension: Secondary | ICD-10-CM | POA: Diagnosis not present

## 2020-12-17 DIAGNOSIS — M810 Age-related osteoporosis without current pathological fracture: Secondary | ICD-10-CM | POA: Diagnosis not present

## 2020-12-17 DIAGNOSIS — D6869 Other thrombophilia: Secondary | ICD-10-CM | POA: Diagnosis not present

## 2020-12-17 DIAGNOSIS — G4700 Insomnia, unspecified: Secondary | ICD-10-CM | POA: Diagnosis not present

## 2020-12-17 DIAGNOSIS — I5032 Chronic diastolic (congestive) heart failure: Secondary | ICD-10-CM | POA: Diagnosis not present

## 2020-12-17 DIAGNOSIS — I359 Nonrheumatic aortic valve disorder, unspecified: Secondary | ICD-10-CM | POA: Diagnosis not present

## 2020-12-17 DIAGNOSIS — E1169 Type 2 diabetes mellitus with other specified complication: Secondary | ICD-10-CM | POA: Diagnosis not present

## 2020-12-23 DIAGNOSIS — D229 Melanocytic nevi, unspecified: Secondary | ICD-10-CM | POA: Diagnosis not present

## 2020-12-23 DIAGNOSIS — L821 Other seborrheic keratosis: Secondary | ICD-10-CM | POA: Diagnosis not present

## 2020-12-23 DIAGNOSIS — Z85828 Personal history of other malignant neoplasm of skin: Secondary | ICD-10-CM | POA: Diagnosis not present

## 2020-12-23 DIAGNOSIS — L905 Scar conditions and fibrosis of skin: Secondary | ICD-10-CM | POA: Diagnosis not present

## 2021-01-27 ENCOUNTER — Other Ambulatory Visit: Payer: Self-pay | Admitting: Cardiology

## 2021-02-10 DIAGNOSIS — G4700 Insomnia, unspecified: Secondary | ICD-10-CM | POA: Diagnosis not present

## 2021-02-11 ENCOUNTER — Ambulatory Visit: Payer: Medicare Other | Admitting: Podiatry

## 2021-02-11 ENCOUNTER — Other Ambulatory Visit: Payer: Self-pay

## 2021-02-11 ENCOUNTER — Encounter: Payer: Self-pay | Admitting: Podiatry

## 2021-02-11 DIAGNOSIS — M79675 Pain in left toe(s): Secondary | ICD-10-CM | POA: Diagnosis not present

## 2021-02-11 DIAGNOSIS — B351 Tinea unguium: Secondary | ICD-10-CM

## 2021-02-11 DIAGNOSIS — M79674 Pain in right toe(s): Secondary | ICD-10-CM | POA: Diagnosis not present

## 2021-02-11 DIAGNOSIS — D689 Coagulation defect, unspecified: Secondary | ICD-10-CM

## 2021-02-11 NOTE — Progress Notes (Signed)
This patient returns to my office for at risk foot care.  This patient requires this care by a professional since this patient will be at risk due to having coagulation defect and diabetes.  Patient is taking eliquis.   This patient is unable to cut nails herself since the patient cannot reach her nails.These nails are painful walking and wearing shoes.  This patient presents for at risk foot care today.  General Appearance  Alert, conversant and in no acute stress.  Vascular  Dorsalis pedis and posterior tibial  pulses are palpable  bilaterally.  Capillary return is within normal limits  bilaterally. Temperature is within normal limits  bilaterally.  Neurologic  Senn-Weinstein monofilament wire test within normal limits  bilaterally. Muscle power within normal limits bilaterally.  Nails Thick disfigured discolored nails with subungual debris  from hallux to fifth toes bilaterally. No evidence of bacterial infection or drainage bilaterally.  Orthopedic  No limitations of motion  feet .  No crepitus or effusions noted.  No bony pathology or digital deformities noted. Hammer toes  B/L.  Skin  normotropic skin with no porokeratosis noted bilaterally.  No signs of infections or ulcers noted.   Contusion noted 2nd  MPJ right foot.  Minimal swelling.  Onychomycosis  Pain in right toes  Pain in left toes  Consent was obtained for treatment procedures.   Mechanical debridement of nails 1-5  bilaterally performed with a nail nipper.  Filed with dremel without incident.    Return office visit    3 months                  Told patient to return for periodic foot care and evaluation due to potential at risk complications.   Gardiner Barefoot DPM

## 2021-02-24 DIAGNOSIS — E119 Type 2 diabetes mellitus without complications: Secondary | ICD-10-CM | POA: Diagnosis not present

## 2021-02-24 DIAGNOSIS — Z8781 Personal history of (healed) traumatic fracture: Secondary | ICD-10-CM | POA: Diagnosis not present

## 2021-02-24 DIAGNOSIS — M810 Age-related osteoporosis without current pathological fracture: Secondary | ICD-10-CM | POA: Diagnosis not present

## 2021-03-11 DIAGNOSIS — M48062 Spinal stenosis, lumbar region with neurogenic claudication: Secondary | ICD-10-CM | POA: Diagnosis not present

## 2021-03-11 DIAGNOSIS — I4819 Other persistent atrial fibrillation: Secondary | ICD-10-CM | POA: Diagnosis not present

## 2021-03-11 DIAGNOSIS — Z23 Encounter for immunization: Secondary | ICD-10-CM | POA: Diagnosis not present

## 2021-03-11 DIAGNOSIS — I719 Aortic aneurysm of unspecified site, without rupture: Secondary | ICD-10-CM | POA: Diagnosis not present

## 2021-03-11 DIAGNOSIS — R54 Age-related physical debility: Secondary | ICD-10-CM | POA: Diagnosis not present

## 2021-03-15 DIAGNOSIS — I5042 Chronic combined systolic (congestive) and diastolic (congestive) heart failure: Secondary | ICD-10-CM | POA: Insufficient documentation

## 2021-03-15 DIAGNOSIS — I5033 Acute on chronic diastolic (congestive) heart failure: Secondary | ICD-10-CM | POA: Insufficient documentation

## 2021-03-15 DIAGNOSIS — I5032 Chronic diastolic (congestive) heart failure: Secondary | ICD-10-CM | POA: Insufficient documentation

## 2021-03-15 NOTE — Progress Notes (Signed)
Cardiology Office Note   Date:  03/16/2021   ID:  Tara Bradley, DOB 07-08-1922, MRN [id number]  PCP:  [name], MD  Cardiologist: Dr. Percival Spanish, 08/15/2017 [name], MD 05/26/2017   Chief Complaint  Patient presents with   Atrial Fibrillation     History of Present Illness: Tara Bradley is a 87 y.o. female with a history of an aortic thrombosed descending aortic dissection, aortic valve sclerosis and chronic afib on Xarelto.  Since I last saw her she has done well.  She is actually flying by herself to [place] again this year to see her family.  She walks with a cane or a walker to keep steady.  She denies any cardiovascular symptoms.  She does not notice her atrial fibrillation except rarely when she goes to sleep at night.  She did have her doctor tell her it was elevated heart rate recently.  She did not feel this.  She is had no presyncope or syncope.  She is had no back or chest pain.f   Past Medical History:  Diagnosis Date   Aortic valve sclerosis    Echo, 2008   Arthritis    Bradycardia    October, 2012   Cancer [facility])    Chronic insomnia    Closed fracture of unspecified part of femur 2005   Diabetes mellitus, type 2 ([place])    Diverticulitis    Diverticulosis    Ejection fraction    EF 60%, echo, February, 2008  //   EF 65-70%, echo, November, 2012   Hyperlipidemia    Hypertension    Long term (current) use of anticoagulants    Osteoporosis    femur fracture 2005, pelvic fracture 2006   Persistent atrial fibrillation ([place])    Personal history of malignant neoplasm of breast    Syncope    Thoracic aortic aneurysm 02/2017   Unspecified closed fracture of pelvis 2006    Past Surgical History:  Procedure Laterality Date   CARDIOVERSION N/A 08/09/2012   Procedure: CARDIOVERSION;  Surgeon: Deboraha Sprang, MD;  Location: [facility];  Service: Cardiovascular;  Laterality: N/A;   EYE SURGERY     FEMUR SURGERY  2005   ORIF   IR ANGIOGRAM  PELVIS SELECTIVE OR SUPRASELECTIVE  03/11/2017   IR ANGIOGRAM SELECTIVE EACH ADDITIONAL VESSEL  03/11/2017   IR EMBO ART  VEN HEMORR LYMPH EXTRAV  INC GUIDE ROADMAPPING  03/11/2017   IR FLUORO GUIDE CV LINE RIGHT  03/11/2017   IR US GUIDE VASC ACCESS RIGHT  03/11/2017   IR US GUIDE VASC ACCESS RIGHT  03/11/2017   MASTECTOMY  1995   left   MINOR HARDWARE REMOVAL Left 09/01/2020   Procedure: REMOVAL OF HARDWARE LEFT FOREARM;  Surgeon: Milly Jakob, MD;  Location: [facility];  Service: Orthopedics;  Laterality: Left;   ORIF ULNAR FRACTURE Left 10/19/2019   Procedure: OPEN REDUCTION INTERNAL FIXATION (ORIF)  LEFT DISTAL RADIUS  AND ULNA FRACTURE;  Surgeon: Milly Jakob, MD;  Location: [facility];  Service: Orthopedics;  Laterality: Left;   SHOULDER SURGERY     left   SHOULDER SURGERY  1979, 2003   TONSILLECTOMY     TOTAL ABDOMINAL HYSTERECTOMY W/ BILATERAL SALPINGOOPHORECTOMY  1995   WRIST SURGERY      x 2     Prior to Admission medications   Medication Sig Start Date End Date Taking? Authorizing Provider  acetaminophen (TYLENOL) 325 MG tablet Take 2 tablets (650 mg total) by  mouth every 6 (six) hours. 10/20/19  Yes Milly Jakob, MD  BD PEN NEEDLE NANO U/F 32G X 4 MM MISC Inject into the skin daily. 11/25/20  Yes [provider]  Calcium Carbonate-Vitamin D 600-400 MG-UNIT tablet Take 1 tablet by mouth daily.   Yes [provider]  Cholecalciferol (VITAMIN D) 2000 UNITS tablet Take 1,000 Units by mouth daily.   Yes [provider]  digoxin (LANOXIN) 0.125 MG tablet TAKE (1/2) TABLET DAILY. 03/31/20  Yes [name], MD  diltiazem (CARDIZEM CD) 300 MG 24 hr capsule TAKE ONE CAPSULE BY MOUTH EVERY DAY 01/28/21  Yes [name], MD  furosemide (LASIX) 40 MG tablet Take 40 mg by mouth daily.    Yes [provider]  losartan-hydrochlorothiazide (HYZAAR) 100-12.5 MG tablet Take 1 tablet by mouth every morning. 09/27/17  Yes [provider]  metFORMIN (GLUMETZA) 500 MG (MOD) 24 hr tablet Take 1,000 mg by mouth every evening.   Yes [provider]  metoprolol tartrate (LOPRESSOR) 25 MG tablet TAKE 1 TABLET BY MOUTH TWICE DAILY. 03/17/20  Yes [name], MD  potassium chloride (K-DUR) 10 MEQ tablet Take 10 mEq by mouth daily.  03/25/15  Yes [provider]  temazepam (RESTORIL) 15 MG capsule Take by mouth in the morning and at bedtime. 12/28/19  Yes [provider]  XARELTO 15 MG TABS tablet TAKE 1 TABLET ONCE DAILY WITH SUPPER. 12/02/20  Yes [name], MD      Allergies:   Ambien [zolpidem tartrate], Codeine, Codeine sulfate, and Latex    ROS:    As stated in the HPI and negative for all other systems.  PHYSICAL EXAM: VS:  BP 128/80   Pulse 86   Ht 5' 5.5" (1.664 m)   Wt 143 lb (64.9 kg)   SpO2 95%   BMI 23.43 kg/m  , BMI Body mass index is 23.43 kg/m. GENERAL:  Well appearing NECK:  No jugular venous distention, waveform within normal limits, carotid upstroke brisk and symmetric, no bruits, no thyromegaly LUNGS:  Clear to auscultation bilaterally CHEST:  Unremarkable HEART:  PMI not displaced or sustained,S1 and S2 within normal limits, no S3, no clicks, no rubs, no murmurs, irregular ABD:  Flat, positive bowel sounds normal in frequency in pitch, no bruits, no rebound, no guarding, no midline pulsatile mass, no hepatomegaly, no splenomegaly EXT:  2 plus pulses throughout, no edema, no cyanosis no clubbing   EKG:  EKG is  ordered today. Atrial fibrillation, rate 86, axis within normal limits, intervals within normal limits, old anteroseptal infarct.  Recent Labs: 08/26/2020: BUN 17; Creatinine, Ser 0.94; Potassium 3.8; Sodium 134    Lipid Panel    Component Value Date/Time   CHOL 132 03/13/2017 0431   TRIG 72 03/13/2017 0431   HDL 48 03/13/2017 0431   CHOLHDL 2.8 03/13/2017 0431   VLDL 14 03/13/2017 0431   LDLCALC 70 03/13/2017 0431   LDLDIRECT 37 03/10/2009  1106     Wt Readings from Last 3 Encounters:  03/16/21 143 lb (64.9 kg)  09/26/20 136 lb 12.8 oz (62.1 kg)  09/16/20 136 lb 12.8 oz (62.1 kg)     Other studies Reviewed: Additional studies/ records that were reviewed today include: Labs  ASSESSMENT AND PLAN  Permanent atrial fibrillation:    Tara Bradley has a CHA2DS2 - VASc score of she tolerates anticoagulation and she will watch her pulse oximeter at home to make sure her heart rate control is adequate.  Otherwise no change in therapy.   Chronic systolic and diastolic CHF:   She seems to be euvolemic.  No change in therapy.  Aortic aneurysm with chronic descending dissection:     We are managing this conservatively.  We are controlling her blood pressure.  No change in therapy.   Current medicines are reviewed at length with the patient today.  The patient does not have concerns regarding medicines.  The following changes have been made:   None  Labs/ tests ordered today include: None  Orders Placed This Encounter  Procedures   EKG 12-Lead      Disposition:    Follow up in 6  months  Signed, [name], MD  03/16/2021 1:08 PM     [facility]

## 2021-03-16 ENCOUNTER — Other Ambulatory Visit: Payer: Self-pay

## 2021-03-16 ENCOUNTER — Encounter: Payer: Self-pay | Admitting: Cardiology

## 2021-03-16 ENCOUNTER — Ambulatory Visit: Payer: Medicare Other | Admitting: Cardiology

## 2021-03-16 VITALS — BP 128/80 | HR 86 | Ht 65.5 in | Wt 143.0 lb

## 2021-03-16 DIAGNOSIS — I5042 Chronic combined systolic (congestive) and diastolic (congestive) heart failure: Secondary | ICD-10-CM | POA: Diagnosis not present

## 2021-03-16 DIAGNOSIS — I719 Aortic aneurysm of unspecified site, without rupture: Secondary | ICD-10-CM

## 2021-03-16 DIAGNOSIS — I4821 Permanent atrial fibrillation: Secondary | ICD-10-CM | POA: Diagnosis not present

## 2021-03-16 DIAGNOSIS — G4700 Insomnia, unspecified: Secondary | ICD-10-CM | POA: Diagnosis not present

## 2021-03-16 NOTE — Patient Instructions (Signed)
Medication Instructions:  Your physician recommends that you continue on your current medications as directed. Please refer to the Current Medication list given to you today.   *If you need a refill on your cardiac medications before your next appointment, please call your pharmacy*  Lab Work: NONE  Testing/Procedures: NONE  Follow-Up: At [facility], you and your health needs are our priority.  As part of our continuing mission to provide you with exceptional heart care, we have created designated Provider Care Teams.  These Care Teams include your primary Cardiologist (physician) and Advanced Practice Providers (APPs -  Physician Assistants and Nurse Practitioners) who all work together to provide you with the care you need, when you need it.  We recommend signing up for the patient portal called "MyChart".  Sign up information is provided on this After Visit Summary.  MyChart is used to connect with patients for Virtual Visits (Telemedicine).  Patients are able to view lab/test results, encounter notes, upcoming appointments, etc.  Non-urgent messages can be sent to your provider as well.   To learn more about what you can do with MyChart, go to [email or web address].    Your next appointment:   6 month(s)  The format for your next appointment:   In Person  Provider:   You may see James Hochrein, MD or one of the following Advanced Practice Providers on your designated Care Team:   Rhonda Barrett, PA-C Jennifer, Lambert, PA-C Kathryn Lawrence, DNP, ANP   

## 2021-03-26 DIAGNOSIS — M170 Bilateral primary osteoarthritis of knee: Secondary | ICD-10-CM | POA: Diagnosis not present

## 2021-03-26 DIAGNOSIS — M1712 Unilateral primary osteoarthritis, left knee: Secondary | ICD-10-CM | POA: Diagnosis not present

## 2021-03-31 DIAGNOSIS — G4700 Insomnia, unspecified: Secondary | ICD-10-CM | POA: Diagnosis not present

## 2021-04-06 DIAGNOSIS — E1122 Type 2 diabetes mellitus with diabetic chronic kidney disease: Secondary | ICD-10-CM | POA: Diagnosis not present

## 2021-04-06 DIAGNOSIS — I5032 Chronic diastolic (congestive) heart failure: Secondary | ICD-10-CM | POA: Diagnosis not present

## 2021-04-06 DIAGNOSIS — E1165 Type 2 diabetes mellitus with hyperglycemia: Secondary | ICD-10-CM | POA: Diagnosis not present

## 2021-04-06 DIAGNOSIS — R208 Other disturbances of skin sensation: Secondary | ICD-10-CM | POA: Diagnosis not present

## 2021-04-06 DIAGNOSIS — N182 Chronic kidney disease, stage 2 (mild): Secondary | ICD-10-CM | POA: Diagnosis not present

## 2021-04-06 DIAGNOSIS — M810 Age-related osteoporosis without current pathological fracture: Secondary | ICD-10-CM | POA: Diagnosis not present

## 2021-04-06 DIAGNOSIS — I10 Essential (primary) hypertension: Secondary | ICD-10-CM | POA: Diagnosis not present

## 2021-04-06 DIAGNOSIS — L820 Inflamed seborrheic keratosis: Secondary | ICD-10-CM | POA: Diagnosis not present

## 2021-04-06 DIAGNOSIS — I129 Hypertensive chronic kidney disease with stage 1 through stage 4 chronic kidney disease, or unspecified chronic kidney disease: Secondary | ICD-10-CM | POA: Diagnosis not present

## 2021-04-06 DIAGNOSIS — E782 Mixed hyperlipidemia: Secondary | ICD-10-CM | POA: Diagnosis not present

## 2021-04-06 DIAGNOSIS — L538 Other specified erythematous conditions: Secondary | ICD-10-CM | POA: Diagnosis not present

## 2021-04-06 DIAGNOSIS — D485 Neoplasm of uncertain behavior of skin: Secondary | ICD-10-CM | POA: Diagnosis not present

## 2021-04-06 DIAGNOSIS — I480 Paroxysmal atrial fibrillation: Secondary | ICD-10-CM | POA: Diagnosis not present

## 2021-04-06 DIAGNOSIS — E119 Type 2 diabetes mellitus without complications: Secondary | ICD-10-CM | POA: Diagnosis not present

## 2021-04-06 DIAGNOSIS — L298 Other pruritus: Secondary | ICD-10-CM | POA: Diagnosis not present

## 2021-04-06 DIAGNOSIS — R54 Age-related physical debility: Secondary | ICD-10-CM | POA: Diagnosis not present

## 2021-04-06 DIAGNOSIS — L821 Other seborrheic keratosis: Secondary | ICD-10-CM | POA: Diagnosis not present

## 2021-04-15 DIAGNOSIS — M1711 Unilateral primary osteoarthritis, right knee: Secondary | ICD-10-CM | POA: Diagnosis not present

## 2021-04-23 DIAGNOSIS — M1711 Unilateral primary osteoarthritis, right knee: Secondary | ICD-10-CM | POA: Diagnosis not present

## 2021-04-27 DIAGNOSIS — G4700 Insomnia, unspecified: Secondary | ICD-10-CM | POA: Diagnosis not present

## 2021-04-30 DIAGNOSIS — M1711 Unilateral primary osteoarthritis, right knee: Secondary | ICD-10-CM | POA: Diagnosis not present

## 2021-05-02 ENCOUNTER — Other Ambulatory Visit: Payer: Self-pay

## 2021-05-02 ENCOUNTER — Encounter (HOSPITAL_COMMUNITY): Payer: Self-pay | Admitting: *Deleted

## 2021-05-02 ENCOUNTER — Emergency Department (HOSPITAL_COMMUNITY): Payer: Medicare Other

## 2021-05-02 ENCOUNTER — Inpatient Hospital Stay (HOSPITAL_COMMUNITY)
Admission: EM | Admit: 2021-05-02 | Discharge: 2021-05-11 | DRG: 483 | Disposition: A | Discharge status: Skilled Nursing Facility | Payer: Medicare Other | Attending: Internal Medicine | Admitting: Internal Medicine

## 2021-05-02 DIAGNOSIS — W06XXXA Fall from bed, initial encounter: Secondary | ICD-10-CM | POA: Diagnosis present

## 2021-05-02 DIAGNOSIS — S42201A Unspecified fracture of upper end of right humerus, initial encounter for closed fracture: Secondary | ICD-10-CM | POA: Diagnosis not present

## 2021-05-02 DIAGNOSIS — I5032 Chronic diastolic (congestive) heart failure: Secondary | ICD-10-CM | POA: Diagnosis not present

## 2021-05-02 DIAGNOSIS — M810 Age-related osteoporosis without current pathological fracture: Secondary | ICD-10-CM | POA: Diagnosis present

## 2021-05-02 DIAGNOSIS — Z7401 Bed confinement status: Secondary | ICD-10-CM | POA: Diagnosis not present

## 2021-05-02 DIAGNOSIS — E785 Hyperlipidemia, unspecified: Secondary | ICD-10-CM | POA: Diagnosis present

## 2021-05-02 DIAGNOSIS — S42291A Other displaced fracture of upper end of right humerus, initial encounter for closed fracture: Secondary | ICD-10-CM | POA: Diagnosis not present

## 2021-05-02 DIAGNOSIS — Y92009 Unspecified place in unspecified non-institutional (private) residence as the place of occurrence of the external cause: Secondary | ICD-10-CM | POA: Diagnosis not present

## 2021-05-02 DIAGNOSIS — R0602 Shortness of breath: Secondary | ICD-10-CM | POA: Diagnosis not present

## 2021-05-02 DIAGNOSIS — Y92032 Bedroom in apartment as the place of occurrence of the external cause: Secondary | ICD-10-CM

## 2021-05-02 DIAGNOSIS — Z20822 Contact with and (suspected) exposure to covid-19: Secondary | ICD-10-CM | POA: Diagnosis not present

## 2021-05-02 DIAGNOSIS — I5033 Acute on chronic diastolic (congestive) heart failure: Secondary | ICD-10-CM | POA: Diagnosis not present

## 2021-05-02 DIAGNOSIS — Z7901 Long term (current) use of anticoagulants: Secondary | ICD-10-CM | POA: Diagnosis not present

## 2021-05-02 DIAGNOSIS — S42291D Other displaced fracture of upper end of right humerus, subsequent encounter for fracture with routine healing: Secondary | ICD-10-CM | POA: Diagnosis not present

## 2021-05-02 DIAGNOSIS — W19XXXA Unspecified fall, initial encounter: Secondary | ICD-10-CM | POA: Diagnosis not present

## 2021-05-02 DIAGNOSIS — Z885 Allergy status to narcotic agent status: Secondary | ICD-10-CM

## 2021-05-02 DIAGNOSIS — M6281 Muscle weakness (generalized): Secondary | ICD-10-CM | POA: Diagnosis not present

## 2021-05-02 DIAGNOSIS — Z8781 Personal history of (healed) traumatic fracture: Secondary | ICD-10-CM | POA: Diagnosis not present

## 2021-05-02 DIAGNOSIS — S3993XA Unspecified injury of pelvis, initial encounter: Secondary | ICD-10-CM | POA: Diagnosis not present

## 2021-05-02 DIAGNOSIS — Z794 Long term (current) use of insulin: Secondary | ICD-10-CM | POA: Diagnosis not present

## 2021-05-02 DIAGNOSIS — Z743 Need for continuous supervision: Secondary | ICD-10-CM | POA: Diagnosis not present

## 2021-05-02 DIAGNOSIS — Z419 Encounter for procedure for purposes other than remedying health state, unspecified: Secondary | ICD-10-CM

## 2021-05-02 DIAGNOSIS — I4819 Other persistent atrial fibrillation: Secondary | ICD-10-CM

## 2021-05-02 DIAGNOSIS — R531 Weakness: Secondary | ICD-10-CM | POA: Diagnosis not present

## 2021-05-02 DIAGNOSIS — S42301A Unspecified fracture of shaft of humerus, right arm, initial encounter for closed fracture: Secondary | ICD-10-CM | POA: Diagnosis present

## 2021-05-02 DIAGNOSIS — I110 Hypertensive heart disease with heart failure: Secondary | ICD-10-CM | POA: Diagnosis not present

## 2021-05-02 DIAGNOSIS — S329XXA Fracture of unspecified parts of lumbosacral spine and pelvis, initial encounter for closed fracture: Secondary | ICD-10-CM | POA: Diagnosis not present

## 2021-05-02 DIAGNOSIS — Z853 Personal history of malignant neoplasm of breast: Secondary | ICD-10-CM | POA: Diagnosis not present

## 2021-05-02 DIAGNOSIS — Z79899 Other long term (current) drug therapy: Secondary | ICD-10-CM

## 2021-05-02 DIAGNOSIS — Z471 Aftercare following joint replacement surgery: Secondary | ICD-10-CM | POA: Diagnosis not present

## 2021-05-02 DIAGNOSIS — E876 Hypokalemia: Secondary | ICD-10-CM | POA: Diagnosis not present

## 2021-05-02 DIAGNOSIS — S0990XA Unspecified injury of head, initial encounter: Secondary | ICD-10-CM | POA: Diagnosis not present

## 2021-05-02 DIAGNOSIS — Z833 Family history of diabetes mellitus: Secondary | ICD-10-CM

## 2021-05-02 DIAGNOSIS — I4891 Unspecified atrial fibrillation: Secondary | ICD-10-CM | POA: Diagnosis not present

## 2021-05-02 DIAGNOSIS — E118 Type 2 diabetes mellitus with unspecified complications: Secondary | ICD-10-CM | POA: Diagnosis present

## 2021-05-02 DIAGNOSIS — S42211A Unspecified displaced fracture of surgical neck of right humerus, initial encounter for closed fracture: Principal | ICD-10-CM | POA: Diagnosis present

## 2021-05-02 DIAGNOSIS — Z90710 Acquired absence of both cervix and uterus: Secondary | ICD-10-CM

## 2021-05-02 DIAGNOSIS — R5381 Other malaise: Secondary | ICD-10-CM | POA: Diagnosis not present

## 2021-05-02 DIAGNOSIS — I4821 Permanent atrial fibrillation: Secondary | ICD-10-CM | POA: Diagnosis present

## 2021-05-02 DIAGNOSIS — R0600 Dyspnea, unspecified: Secondary | ICD-10-CM

## 2021-05-02 DIAGNOSIS — Z01810 Encounter for preprocedural cardiovascular examination: Secondary | ICD-10-CM | POA: Diagnosis not present

## 2021-05-02 DIAGNOSIS — J9601 Acute respiratory failure with hypoxia: Secondary | ICD-10-CM | POA: Diagnosis not present

## 2021-05-02 DIAGNOSIS — R0902 Hypoxemia: Secondary | ICD-10-CM | POA: Diagnosis not present

## 2021-05-02 DIAGNOSIS — R410 Disorientation, unspecified: Secondary | ICD-10-CM | POA: Diagnosis not present

## 2021-05-02 DIAGNOSIS — I517 Cardiomegaly: Secondary | ICD-10-CM | POA: Diagnosis not present

## 2021-05-02 DIAGNOSIS — Z8249 Family history of ischemic heart disease and other diseases of the circulatory system: Secondary | ICD-10-CM

## 2021-05-02 DIAGNOSIS — R2689 Other abnormalities of gait and mobility: Secondary | ICD-10-CM | POA: Diagnosis not present

## 2021-05-02 DIAGNOSIS — F5104 Psychophysiologic insomnia: Secondary | ICD-10-CM | POA: Diagnosis present

## 2021-05-02 DIAGNOSIS — R42 Dizziness and giddiness: Secondary | ICD-10-CM | POA: Diagnosis not present

## 2021-05-02 DIAGNOSIS — T450X5A Adverse effect of antiallergic and antiemetic drugs, initial encounter: Secondary | ICD-10-CM | POA: Diagnosis present

## 2021-05-02 DIAGNOSIS — Z96611 Presence of right artificial shoulder joint: Secondary | ICD-10-CM | POA: Diagnosis not present

## 2021-05-02 DIAGNOSIS — Z888 Allergy status to other drugs, medicaments and biological substances status: Secondary | ICD-10-CM

## 2021-05-02 DIAGNOSIS — I959 Hypotension, unspecified: Secondary | ICD-10-CM | POA: Diagnosis present

## 2021-05-02 DIAGNOSIS — Z9010 Acquired absence of unspecified breast and nipple: Secondary | ICD-10-CM

## 2021-05-02 DIAGNOSIS — K8020 Calculus of gallbladder without cholecystitis without obstruction: Secondary | ICD-10-CM | POA: Diagnosis not present

## 2021-05-02 DIAGNOSIS — S2241XA Multiple fractures of ribs, right side, initial encounter for closed fracture: Secondary | ICD-10-CM | POA: Diagnosis not present

## 2021-05-02 DIAGNOSIS — M8080XA Other osteoporosis with current pathological fracture, unspecified site, initial encounter for fracture: Secondary | ICD-10-CM

## 2021-05-02 DIAGNOSIS — Z90722 Acquired absence of ovaries, bilateral: Secondary | ICD-10-CM

## 2021-05-02 DIAGNOSIS — Z7984 Long term (current) use of oral hypoglycemic drugs: Secondary | ICD-10-CM

## 2021-05-02 DIAGNOSIS — M1990 Unspecified osteoarthritis, unspecified site: Secondary | ICD-10-CM | POA: Diagnosis not present

## 2021-05-02 DIAGNOSIS — E871 Hypo-osmolality and hyponatremia: Secondary | ICD-10-CM | POA: Diagnosis not present

## 2021-05-02 DIAGNOSIS — I482 Chronic atrial fibrillation, unspecified: Secondary | ICD-10-CM

## 2021-05-02 DIAGNOSIS — S42301D Unspecified fracture of shaft of humerus, right arm, subsequent encounter for fracture with routine healing: Secondary | ICD-10-CM | POA: Diagnosis not present

## 2021-05-02 DIAGNOSIS — Z043 Encounter for examination and observation following other accident: Secondary | ICD-10-CM | POA: Diagnosis not present

## 2021-05-02 DIAGNOSIS — M25519 Pain in unspecified shoulder: Secondary | ICD-10-CM | POA: Diagnosis not present

## 2021-05-02 DIAGNOSIS — Z807 Family history of other malignant neoplasms of lymphoid, hematopoietic and related tissues: Secondary | ICD-10-CM

## 2021-05-02 DIAGNOSIS — Z91040 Latex allergy status: Secondary | ICD-10-CM

## 2021-05-02 DIAGNOSIS — J90 Pleural effusion, not elsewhere classified: Secondary | ICD-10-CM | POA: Diagnosis not present

## 2021-05-02 DIAGNOSIS — G8918 Other acute postprocedural pain: Secondary | ICD-10-CM | POA: Diagnosis not present

## 2021-05-02 DIAGNOSIS — R2681 Unsteadiness on feet: Secondary | ICD-10-CM | POA: Diagnosis not present

## 2021-05-02 HISTORY — DX: Other specified disorders of bone density and structure, unspecified site: M85.80

## 2021-05-02 LAB — RESP PANEL BY RT-PCR (FLU A&B, COVID) ARPGX2
Influenza A by PCR: NEGATIVE
Influenza B by PCR: NEGATIVE
SARS Coronavirus 2 by RT PCR: NEGATIVE

## 2021-05-02 LAB — CBC WITH DIFFERENTIAL/PLATELET
Abs Immature Granulocytes: 0.06 10*3/uL (ref 0.00–0.07)
Basophils Absolute: 0.1 10*3/uL (ref 0.0–0.1)
Basophils Relative: 1 %
Eosinophils Absolute: 0.1 10*3/uL (ref 0.0–0.5)
Eosinophils Relative: 1 %
HCT: 42.8 % (ref 36.0–46.0)
Hemoglobin: 14.2 g/dL (ref 12.0–15.0)
Immature Granulocytes: 1 %
Lymphocytes Relative: 17 %
Lymphs Abs: 1.6 10*3/uL (ref 0.7–4.0)
MCH: 31.7 pg (ref 26.0–34.0)
MCHC: 33.2 g/dL (ref 30.0–36.0)
MCV: 95.5 fL (ref 80.0–100.0)
Monocytes Absolute: 0.8 10*3/uL (ref 0.1–1.0)
Monocytes Relative: 9 %
Neutro Abs: 6.4 10*3/uL (ref 1.7–7.7)
Neutrophils Relative %: 71 %
Platelets: 190 10*3/uL (ref 150–400)
RBC: 4.48 MIL/uL (ref 3.87–5.11)
RDW: 13.2 % (ref 11.5–15.5)
WBC: 9 10*3/uL (ref 4.0–10.5)
nRBC: 0 % (ref 0.0–0.2)

## 2021-05-02 LAB — BASIC METABOLIC PANEL
Anion gap: 9 (ref 5–15)
BUN: 24 mg/dL — ABNORMAL HIGH (ref 8–23)
CO2: 30 mmol/L (ref 22–32)
Calcium: 9.5 mg/dL (ref 8.9–10.3)
Chloride: 97 mmol/L — ABNORMAL LOW (ref 98–111)
Creatinine, Ser: 0.9 mg/dL (ref 0.44–1.00)
GFR, Estimated: 59 mL/min — ABNORMAL LOW (ref >60)
Glucose, Bld: 228 mg/dL — ABNORMAL HIGH (ref 70–99)
Potassium: 3.2 mmol/L — ABNORMAL LOW (ref 3.5–5.1)
Sodium: 136 mmol/L (ref 135–145)

## 2021-05-02 LAB — DIGOXIN LEVEL: Digoxin Level: 0.7 ng/mL — ABNORMAL LOW (ref 0.8–2.0)

## 2021-05-02 LAB — CBG MONITORING, ED
Glucose-Capillary: 250 mg/dL — ABNORMAL HIGH (ref 70–99)
Glucose-Capillary: 125 mg/dL — ABNORMAL HIGH (ref 70–99)
Glucose-Capillary: 201 mg/dL — ABNORMAL HIGH (ref 70–99)

## 2021-05-02 LAB — MAGNESIUM: Magnesium: 1.9 mg/dL (ref 1.7–2.4)

## 2021-05-02 MED ORDER — METOPROLOL TARTRATE 25 MG PO TABS: 25.0000 mg | ORAL_TABLET | Freq: Two times a day (BID) | ORAL | Status: DC

## 2021-05-02 MED ORDER — MORPHINE SULFATE (PF) 2 MG/ML IV SOLN
2.0000 mg | Freq: Once | INTRAVENOUS | Status: AC
Start: 2021-05-02 — End: 2021-05-02
  Administered 2021-05-02: 2 mg via INTRAVENOUS
  Filled 2021-05-02: qty 1

## 2021-05-02 MED ORDER — INSULIN LISPRO PROT & LISPRO (50-50 MIX) 100 UNIT/ML KWIKPEN: 5.0000 [IU] | PEN_INJECTOR | Freq: Every day | SUBCUTANEOUS | Status: DC

## 2021-05-02 MED ORDER — INSULIN ASPART PROT & ASPART (70-30 MIX) 100 UNIT/ML ~~LOC~~ SUSP
5.0000 [IU] | Freq: Every day | SUBCUTANEOUS | Status: DC
Start: 2021-05-02 — End: 2021-05-03
  Administered 2021-05-02 – 2021-05-03 (×2): 5 [IU] via SUBCUTANEOUS
  Filled 2021-05-02 (×2): qty 10

## 2021-05-02 MED ORDER — ACETAMINOPHEN 500 MG PO TABS
1000.0000 mg | ORAL_TABLET | Freq: Once | ORAL | Status: AC
  Administered 2021-05-02: 1000 mg via ORAL
  Filled 2021-05-02: qty 2

## 2021-05-02 MED ORDER — ACETAMINOPHEN 650 MG RE SUPP: 650.0000 mg | Freq: Four times a day (QID) | RECTAL | Status: DC | PRN

## 2021-05-02 MED ORDER — INSULIN ASPART 100 UNIT/ML IJ SOLN
0.0000 [IU] | Freq: Three times a day (TID) | INTRAMUSCULAR | Status: DC
  Administered 2021-05-02: 2 [IU] via SUBCUTANEOUS
  Administered 2021-05-03 – 2021-05-04 (×4): 1 [IU] via SUBCUTANEOUS
  Administered 2021-05-05: 6 [IU] via SUBCUTANEOUS
  Administered 2021-05-05: 3 [IU] via SUBCUTANEOUS
  Administered 2021-05-05: 4 [IU] via SUBCUTANEOUS
  Administered 2021-05-06: 10:00:00 2 [IU] via SUBCUTANEOUS
  Administered 2021-05-06: 18:00:00 3 [IU] via SUBCUTANEOUS
  Administered 2021-05-06: 13:00:00 5 [IU] via SUBCUTANEOUS
  Administered 2021-05-07: 4 [IU] via SUBCUTANEOUS
  Administered 2021-05-07: 1 [IU] via SUBCUTANEOUS
  Administered 2021-05-07: 3 [IU] via SUBCUTANEOUS

## 2021-05-02 MED ORDER — ONDANSETRON HCL 4 MG/2ML IJ SOLN
4.0000 mg | Freq: Four times a day (QID) | INTRAMUSCULAR | Status: DC | PRN
  Administered 2021-05-02 – 2021-05-04 (×4): 4 mg via INTRAVENOUS
  Filled 2021-05-02 (×3): qty 2

## 2021-05-02 MED ORDER — SODIUM CHLORIDE 0.9% FLUSH
3.0000 mL | Freq: Two times a day (BID) | INTRAVENOUS | Status: DC
  Administered 2021-05-03 – 2021-05-11 (×14): 3 mL via INTRAVENOUS

## 2021-05-02 MED ORDER — DILTIAZEM HCL-DEXTROSE 125-5 MG/125ML-% IV SOLN (PREMIX)
5.0000 mg/h | INTRAVENOUS | Status: DC
  Administered 2021-05-02: 5 mg/h via INTRAVENOUS
  Administered 2021-05-03: 10 mg/h via INTRAVENOUS
  Administered 2021-05-03: 02:00:00 5 mg/h via INTRAVENOUS
  Administered 2021-05-04 – 2021-05-05 (×3): 12.5 mg/h via INTRAVENOUS
  Filled 2021-05-02 (×6): qty 125

## 2021-05-02 MED ORDER — MORPHINE SULFATE (PF) 2 MG/ML IV SOLN
2.0000 mg | INTRAVENOUS | Status: DC | PRN
  Administered 2021-05-02 – 2021-05-03 (×2): 2 mg via INTRAVENOUS
  Filled 2021-05-02 (×2): qty 1

## 2021-05-02 MED ORDER — SODIUM CHLORIDE 0.9 % IV BOLUS
500.0000 mL | Freq: Once | INTRAVENOUS | Status: AC
  Administered 2021-05-02: 500 mL via INTRAVENOUS

## 2021-05-02 MED ORDER — ONDANSETRON HCL 4 MG PO TABS: 4.0000 mg | ORAL_TABLET | Freq: Four times a day (QID) | ORAL | Status: DC | PRN

## 2021-05-02 MED ORDER — ONDANSETRON HCL 4 MG/2ML IJ SOLN
4.0000 mg | Freq: Once | INTRAMUSCULAR | Status: AC
  Administered 2021-05-02: 4 mg via INTRAVENOUS
  Filled 2021-05-02: qty 2

## 2021-05-02 MED ORDER — SODIUM CHLORIDE 0.9 % IV BOLUS
250.0000 mL | Freq: Once | INTRAVENOUS | Status: AC
  Administered 2021-05-02: 250 mL via INTRAVENOUS

## 2021-05-02 MED ORDER — METOPROLOL TARTRATE 25 MG PO TABS: 25.0000 mg | ORAL_TABLET | Freq: Once | ORAL | Status: DC

## 2021-05-02 MED ORDER — DIGOXIN 125 MCG PO TABS
0.0625 mg | ORAL_TABLET | Freq: Every day | ORAL | Status: DC
  Administered 2021-05-02 – 2021-05-09 (×8): 0.0625 mg via ORAL
  Filled 2021-05-02 (×8): qty 1

## 2021-05-02 MED ORDER — DILTIAZEM HCL ER COATED BEADS 300 MG PO CP24: 300.0000 mg | ORAL_CAPSULE | Freq: Every day | ORAL | Status: DC

## 2021-05-02 MED ORDER — RIVAROXABAN 15 MG PO TABS: 15.0000 mg | ORAL_TABLET | Freq: Every day | ORAL | Status: DC

## 2021-05-02 MED ORDER — TEMAZEPAM 7.5 MG PO CAPS: 7.5000 mg | ORAL_CAPSULE | Freq: Every evening | ORAL | Status: DC | PRN

## 2021-05-02 MED ORDER — TEMAZEPAM 15 MG PO CAPS: 15.0000 mg | ORAL_CAPSULE | Freq: Every day | ORAL | Status: DC

## 2021-05-02 MED ORDER — HYDROCODONE-ACETAMINOPHEN 5-325 MG PO TABS
1.0000 | ORAL_TABLET | Freq: Four times a day (QID) | ORAL | Status: DC | PRN
  Administered 2021-05-02 – 2021-05-03 (×3): 1 via ORAL
  Filled 2021-05-02 (×3): qty 1

## 2021-05-02 MED ORDER — TRAMADOL HCL 50 MG PO TABS: 50.0000 mg | ORAL_TABLET | Freq: Four times a day (QID) | ORAL | 0 refills | Status: DC | PRN

## 2021-05-02 MED ORDER — MELATONIN 3 MG PO TABS: 3.0000 mg | ORAL_TABLET | Freq: Every day | ORAL | Status: DC

## 2021-05-02 MED ORDER — HEPARIN (PORCINE) 25000 UT/250ML-% IV SOLN
1000.0000 [IU]/h | INTRAVENOUS | Status: DC
  Administered 2021-05-02: 20:00:00 950 [IU]/h via INTRAVENOUS
  Administered 2021-05-03: 1000 [IU]/h via INTRAVENOUS
  Filled 2021-05-02 (×2): qty 250

## 2021-05-02 MED ORDER — ALBUTEROL SULFATE (2.5 MG/3ML) 0.083% IN NEBU: 2.5000 mg | INHALATION_SOLUTION | Freq: Four times a day (QID) | RESPIRATORY_TRACT | Status: DC | PRN

## 2021-05-02 MED ORDER — POTASSIUM CHLORIDE CRYS ER 20 MEQ PO TBCR
60.0000 meq | EXTENDED_RELEASE_TABLET | Freq: Once | ORAL | Status: AC
  Administered 2021-05-02: 60 meq via ORAL
  Filled 2021-05-02: qty 3

## 2021-05-02 MED ORDER — ACETAMINOPHEN 325 MG PO TABS
650.0000 mg | ORAL_TABLET | Freq: Four times a day (QID) | ORAL | Status: DC | PRN
  Administered 2021-05-03 – 2021-05-07 (×2): 650 mg via ORAL
  Filled 2021-05-02 (×2): qty 2

## 2021-05-02 MED ORDER — DILTIAZEM HCL ER COATED BEADS 300 MG PO CP24
300.0000 mg | ORAL_CAPSULE | Freq: Once | ORAL | Status: DC
  Filled 2021-05-02: qty 1

## 2021-05-02 MED ORDER — DILTIAZEM LOAD VIA INFUSION
20.0000 mg | Freq: Once | INTRAVENOUS | Status: AC
  Administered 2021-05-02: 20 mg via INTRAVENOUS
  Filled 2021-05-02: qty 20

## 2021-05-02 MED ORDER — SODIUM CHLORIDE 0.9 % IV SOLN: Freq: Once | INTRAVENOUS | Status: AC

## 2021-05-02 NOTE — ED Notes (Signed)
Sats will decrease when patient goes to sleep placed on 3L Secretary Dr. Gilford Raid aware.

## 2021-05-02 NOTE — H&P (View-Only) (Signed)
Reason for Consult:Right proximal humerus fracture  Referring Physician: EDP  Tara Bradley is an 87 y.o. female.  HPI: 87 yo active and independent female who presents after an acute fall due to getting dizzy from taking sleeping medicine with right shoulder pain. Patient unable to move her arm after the injury. She has a history of prior left shoulder surgery years and years ago with resulting instability and weakness so her right arm is her go-to arm. She reports no pain or problems with the arm before this injury.   Past Medical History:  Diagnosis Date   Aortic valve sclerosis    Echo, 2008   Arthritis    Bradycardia    October, 2012   Cancer [facility])    Chronic insomnia    Closed fracture of unspecified part of femur 2005   Diabetes mellitus, type 2 ([place])    Diverticulitis    Diverticulosis    Ejection fraction    EF 60%, echo, February, 2008  //   EF 65-70%, echo, November, 2012   Hyperlipidemia    Hypertension    Long term (current) use of anticoagulants    Osteopenia    Osteoporosis    femur fracture 2005, pelvic fracture 2006   Persistent atrial fibrillation ([place])    Personal history of malignant neoplasm of breast    Syncope    Thoracic aortic aneurysm 02/2017   Unspecified closed fracture of pelvis 2006    Past Surgical History:  Procedure Laterality Date   CARDIOVERSION N/A 08/09/2012   Procedure: CARDIOVERSION;  Surgeon: Deboraha Sprang, MD;  Location: [employer];  Service: Cardiovascular;  Laterality: N/A;   EYE SURGERY     FEMUR SURGERY  2005   ORIF   IR ANGIOGRAM PELVIS SELECTIVE OR SUPRASELECTIVE  03/11/2017   IR ANGIOGRAM SELECTIVE EACH ADDITIONAL VESSEL  03/11/2017   IR EMBO ART  VEN HEMORR LYMPH EXTRAV  INC GUIDE ROADMAPPING  03/11/2017   IR FLUORO GUIDE CV LINE RIGHT  03/11/2017   IR US GUIDE VASC ACCESS RIGHT  03/11/2017   IR US GUIDE VASC ACCESS RIGHT  03/11/2017   MASTECTOMY  1995   left   MINOR HARDWARE REMOVAL Left 09/01/2020   Procedure:  REMOVAL OF HARDWARE LEFT FOREARM;  Surgeon: Milly Jakob, MD;  Location: [place];  Service: Orthopedics;  Laterality: Left;   ORIF ULNAR FRACTURE Left 10/19/2019   Procedure: OPEN REDUCTION INTERNAL FIXATION (ORIF)  LEFT DISTAL RADIUS  AND ULNA FRACTURE;  Surgeon: Milly Jakob, MD;  Location: [place];  Service: Orthopedics;  Laterality: Left;   SHOULDER SURGERY     left   SHOULDER SURGERY  1979, 2003   TONSILLECTOMY     TOTAL ABDOMINAL HYSTERECTOMY W/ BILATERAL SALPINGOOPHORECTOMY  1995   WRIST SURGERY      x 2     Family History  Problem Relation Age of Onset   Congestive Heart Failure Mother 79   Heart attack Father 104   Diabetes type II Father    Diabetes type II Brother        1/2   Multiple myeloma Brother        2/2   Diabetes type II Son    Colon cancer Neg Hx     Social History:  reports that she has never smoked. She has never used smokeless tobacco. She reports that she does not drink alcohol and does not use drugs.  Allergies:  Allergies  Allergen Reactions   Ambien [Zolpidem Tartrate]  Hallucinations    Codeine Other (See Comments)    headache   Codeine Sulfate     REACTION: unspecified   Latex Hives   Remeron [Mirtazapine]     Medications: I have reviewed the patient's current medications.  Results for orders placed or performed during the hospital encounter of 05/02/21 (from the past 48 hour(s))  Basic metabolic panel     Status: Abnormal   Collection Time: 05/02/21  5:06 AM  Result Value Ref Range   Sodium 136 135 - 145 mmol/L   Potassium 3.2 (L) 3.5 - 5.1 mmol/L   Chloride 97 (L) 98 - 111 mmol/L   CO2 30 22 - 32 mmol/L   Glucose, Bld 228 (H) 70 - 99 mg/dL    Comment: Glucose reference range applies only to samples taken after fasting for at least 8 hours.   BUN 24 (H) 8 - 23 mg/dL   Creatinine, Ser 0.90 0.44 - 1.00 mg/dL   Calcium 9.5 8.9 - 10.3 mg/dL   GFR, Estimated 59 (L) >60 mL/min    Comment: (NOTE) Calculated  using the CKD-EPI Creatinine Equation (2021)    Anion gap 9 5 - 15    Comment: Performed at [facility] [address], [place], [place] [address]  CBC with Differential     Status: None   Collection Time: 05/02/21  5:06 AM  Result Value Ref Range   WBC 9.0 4.0 - 10.5 K/uL   RBC 4.48 3.87 - 5.11 MIL/uL   Hemoglobin 14.2 12.0 - 15.0 g/dL   HCT 42.8 36.0 - 46.0 %   MCV 95.5 80.0 - 100.0 fL   MCH 31.7 26.0 - 34.0 pg   MCHC 33.2 30.0 - 36.0 g/dL   RDW 13.2 11.5 - 15.5 %   Platelets 190 150 - 400 K/uL   nRBC 0.0 0.0 - 0.2 %   Neutrophils Relative % 71 %   Neutro Abs 6.4 1.7 - 7.7 K/uL   Lymphocytes Relative 17 %   Lymphs Abs 1.6 0.7 - 4.0 K/uL   Monocytes Relative 9 %   Monocytes Absolute 0.8 0.1 - 1.0 K/uL   Eosinophils Relative 1 %   Eosinophils Absolute 0.1 0.0 - 0.5 K/uL   Basophils Relative 1 %   Basophils Absolute 0.1 0.0 - 0.1 K/uL   Immature Granulocytes 1 %   Abs Immature Granulocytes 0.06 0.00 - 0.07 K/uL    Comment: Performed at [facility], [address], [place], [place] [address]  Magnesium     Status: None   Collection Time: 05/02/21  5:06 AM  Result Value Ref Range   Magnesium 1.9 1.7 - 2.4 mg/dL    Comment: Performed at [facility] [address] [place], [place] [address]  Digoxin level     Status: Abnormal   Collection Time: 05/02/21  9:11 AM  Result Value Ref Range   Digoxin Level 0.7 (L) 0.8 - 2.0 ng/mL    Comment: Performed at [facility], [place] [address] [place], [place] [address]    CT Head Wo Contrast  Result Date: 05/02/2021 CLINICAL DATA:  87 year old female status post fall on blood thinners. EXAM: CT HEAD WITHOUT CONTRAST TECHNIQUE: Contiguous axial images were obtained from the base of the skull through the vertex without intravenous contrast. COMPARISON:  Head CT 10/19/2019. FINDINGS: Brain: Stable cerebral volume from last year. No midline shift, ventriculomegaly, mass effect, evidence of mass lesion, intracranial  hemorrhage or evidence of  cortically based acute infarction. Stable gray-white matter differentiation throughout the brain. Patchy and confluent bilateral cerebral white matter hypodensity with mild heterogeneity of the deep gray nuclei. Vascular: Calcified atherosclerosis at the skull base. No suspicious intracranial vascular hyperdensity. Skull: Stable, intact. Sinuses/Orbits: Visualized paranasal sinuses and mastoids are stable and well aerated. Other: Proximal right humerus fracture evident on CT scout view, see dedicated right humerus series today. No orbit or scalp soft tissue injury identified. IMPRESSION: No acute intracranial abnormality. No acute traumatic injury to the head. Electronically Signed   By: Genevie Ann M.D.   On: 05/02/2021 07:07   CT Cervical Spine Wo Contrast  Result Date: 05/02/2021 CLINICAL DATA:  87 year old female status post fall on blood thinners. EXAM: CT CERVICAL SPINE WITHOUT CONTRAST TECHNIQUE: Multidetector CT imaging of the cervical spine was performed without intravenous contrast. Multiplanar CT image reconstructions were also generated. COMPARISON:  Head CT today.  Cervical spine CT 10/19/2019. FINDINGS: Alignment: Stable. Trace anterolisthesis of C7 on T1. Bilateral posterior element alignment is within normal limits. Skull base and vertebrae: Visualized skull base is intact. No atlanto-occipital dissociation. Osteopenia. Stable C1-C2 alignment, and those levels appear stable and intact. No acute osseous abnormality identified. Soft tissues and spinal canal: No prevertebral fluid or swelling. No visible canal hematoma. Partially retropharyngeal carotids with bilateral calcified atherosclerosis. Otherwise negative for age visible noncontrast neck soft tissues. Disc levels: Capacious spinal canal despite widespread chronic cervical spine degeneration. Chronic interbody ankylosis C5-C6. Upper chest: Visible upper thoracic levels appears stable. Negative lung apices. IMPRESSION:  1. No acute traumatic injury identified in the cervical spine. 2. Chronic cervical spine degeneration. Electronically Signed   By: Genevie Ann M.D.   On: 05/02/2021 07:10   DG Pelvis Portable  Result Date: 05/02/2021 CLINICAL DATA:  Fall. EXAM: PORTABLE PELVIS 1-2 VIEWS COMPARISON:  March 15, 2017. FINDINGS: Status post intramedullary rod fixation of right femur. Old fractures are seen involving bilateral superior and inferior pubic rami. No definite acute fracture or dislocation is noted. IMPRESSION: Old postsurgical and posttraumatic changes as described above. No acute abnormality is noted. Electronically Signed   By: Marijo Conception M.D.   On: 05/02/2021 06:48   DG Chest Portable 1 View  Result Date: 05/02/2021 CLINICAL DATA:  Fall. EXAM: PORTABLE CHEST 1 VIEW COMPARISON:  March 10, 2017. FINDINGS: Stable cardiomegaly. Old left rib fractures are noted. Lungs are clear. IMPRESSION: No acute cardiopulmonary abnormality seen. Aortic Atherosclerosis (ICD10-I70.0). Electronically Signed   By: Marijo Conception M.D.   On: 05/02/2021 06:43   DG Humerus Right  Result Date: 05/02/2021 CLINICAL DATA:  Fall. EXAM: RIGHT HUMERUS - 2+ VIEW COMPARISON:  March 10, 2017. FINDINGS: Severely displaced fracture is seen involving the proximal right humeral neck of indeterminate age. Old displaced acromial fracture is noted as well. IMPRESSION: Severely displaced proximal right humeral neck fracture of indeterminate age. Old displaced acromial fracture is noted as well. Electronically Signed   By: Marijo Conception M.D.   On: 05/02/2021 06:46    Review of Systems Blood pressure (!) 86/67, pulse (!) 115, temperature 98.5 F (36.9 C), temperature source Temporal, resp. rate 17, height 5' 5.5" (1.664 m), weight 63.5 kg, SpO2 98 %. Physical Exam Awake and alert and oriented. Neck non tender. Chest non tender with normal chest rise. Heart is irregularly irregular Right shoulder is swollen and tender to palpation and  there is pain with any movement of the arm. Elbow and wrist are non swollen and non tender Left  shoulder is a little swollen but not tender and no pain with movement. She has a healed old deltopectoral incision on the left side.  Normal elbow and wrist ROM and normal strength on the left.  Bilateral LEs with pain free AROM and 5/5 strength L spine non tender  Assessment/Plan: Right acute surgical neck fracture of the humerus.  Old Right displaced acromial fracture After discussing options with the patient, based upon her severe pain and dependence on the right shoulder and the poor function with the left one, she would be best served with ORIF. Patient agrees. This will allow her to be independent more quickly and will help with pain control. The acromion fracture did no seem to limit her and will be ignored in this.  She is on Xarelto and will need to be off of this for a few days prior to surgery.  Patient will also need her A Fib addressed and stable prior to medical clearance for surgery.   Augustin Schooling 05/02/2021, 10:44 AM

## 2021-05-02 NOTE — Consult Note (Addendum)
Cardiology Consultation:   Patient ID: Tara Bradley; [id number]; Jun 14, 1922   Admit date: 05/02/2021 Date of Consult: 05/02/2021  Primary Care Provider: [name], MD Primary Cardiologist: [name], MD  Primary Electrophysiologist:  None   Patient Profile:   Tara Bradley is a 87 y.o. female with a hx of with a history of an aortic thrombosed descending aortic dissection, aortic valve sclerosis and chronic afib on Xarelto.  who is being seen today for the evaluation of atrial fib  at the request of Dr. Tamala Julian.  History of Present Illness:   Tara Bradley is a remarkable lady was actually planning to fly to [place] (as she does yearly) by herself to see her family.  She unfortunately fell and has a fracture of her humerus.  She will need surgery.   She has chronic atrial fib.  Ventricular rate jumps up at home for short periods but comes back down.  In the ED it has been elevated.  BP is labile.  She was doing OK.  She reports increased rates at home occasionally but that it "comes right back down."  She feels well.  The patient denies any new symptoms such as chest discomfort, neck or arm discomfort. There has been no new shortness of breath, PND orthopnea. There has been no reported presyncope or syncope.    She does feel palpitations occasionally.  She had a mechanical fall after becoming disoriented after taking something to help her sleep.  She had no LOC  Past Medical History:  Diagnosis Date   Aortic valve sclerosis    Echo, 2008   Arthritis    Bradycardia    October, 2012   Cancer [facility])    Chronic insomnia    Closed fracture of unspecified part of femur 2005   Diabetes mellitus, type 2 ([place])    Diverticulitis    Diverticulosis    Ejection fraction    EF 60%, echo, February, 2008  //   EF 65-70%, echo, November, 2012   Hyperlipidemia    Hypertension    Long term (current) use of anticoagulants    Osteopenia    Osteoporosis    femur fracture 2005,  pelvic fracture 2006   Persistent atrial fibrillation ([place])    Personal history of malignant neoplasm of breast    Syncope    Thoracic aortic aneurysm 02/2017   Unspecified closed fracture of pelvis 2006    Past Surgical History:  Procedure Laterality Date   CARDIOVERSION N/A 08/09/2012   Procedure: CARDIOVERSION;  Surgeon: Deboraha Sprang, MD;  Location: [facility];  Service: Cardiovascular;  Laterality: N/A;   EYE SURGERY     FEMUR SURGERY  2005   ORIF   IR ANGIOGRAM PELVIS SELECTIVE OR SUPRASELECTIVE  03/11/2017   IR ANGIOGRAM SELECTIVE EACH ADDITIONAL VESSEL  03/11/2017   IR EMBO ART  VEN HEMORR LYMPH EXTRAV  INC GUIDE ROADMAPPING  03/11/2017   IR FLUORO GUIDE CV LINE RIGHT  03/11/2017   IR US GUIDE VASC ACCESS RIGHT  03/11/2017   IR US GUIDE VASC ACCESS RIGHT  03/11/2017   MASTECTOMY  1995   left   MINOR HARDWARE REMOVAL Left 09/01/2020   Procedure: REMOVAL OF HARDWARE LEFT FOREARM;  Surgeon: Milly Jakob, MD;  Location: [facility];  Service: Orthopedics;  Laterality: Left;   ORIF ULNAR FRACTURE Left 10/19/2019   Procedure: OPEN REDUCTION INTERNAL FIXATION (ORIF)  LEFT DISTAL RADIUS  AND ULNA FRACTURE;  Surgeon: Milly Jakob, MD;  Location: [facility]  OR;  Service: Orthopedics;  Laterality: Left;   SHOULDER SURGERY     left   SHOULDER SURGERY  1979, 2003   TONSILLECTOMY     TOTAL ABDOMINAL HYSTERECTOMY W/ BILATERAL SALPINGOOPHORECTOMY  1995   WRIST SURGERY      x 2      Home Medications:  Prior to Admission medications   Medication Sig Start Date End Date Taking? Authorizing Provider  acetaminophen (TYLENOL) 325 MG tablet Take 2 tablets (650 mg total) by mouth every 6 (six) hours. Patient taking differently: Take 650 mg by mouth every 6 (six) hours as needed for mild pain or headache. 10/20/19  Yes Milly Jakob, MD  BD PEN NEEDLE NANO U/F 32G X 4 MM MISC Inject into the skin daily. 11/25/20  Yes [provider]  Calcium Carbonate-Vitamin D 600-400  MG-UNIT tablet Take 1 tablet by mouth daily.   Yes [provider]  Cholecalciferol (VITAMIN D) 2000 UNITS tablet Take 1,000 Units by mouth daily.   Yes [provider]  digoxin (LANOXIN) 0.125 MG tablet TAKE (1/2) TABLET DAILY. Patient taking differently: Take 0.0625 mg by mouth daily. 03/31/20  Yes [name], MD  diltiazem (CARDIZEM CD) 300 MG 24 hr capsule TAKE ONE CAPSULE BY MOUTH EVERY DAY Patient taking differently: Take 300 mg by mouth daily. 01/28/21  Yes [name], MD  furosemide (LASIX) 40 MG tablet Take 40 mg by mouth daily.    Yes [provider]  HUMALOG MIX 50/50 KWIKPEN (50-50) 100 UNIT/ML KwikPen Inject 5 Units into the skin daily. 04/28/21  Yes [provider]  losartan-hydrochlorothiazide (HYZAAR) 100-12.5 MG tablet Take 1 tablet by mouth every morning. 09/27/17  Yes [provider]  metFORMIN (GLUMETZA) 500 MG (MOD) 24 hr tablet Take 1,000 mg by mouth every evening.   Yes [provider]  metoprolol tartrate (LOPRESSOR) 25 MG tablet TAKE 1 TABLET BY MOUTH TWICE DAILY. Patient taking differently: Take 25 mg by mouth 2 (two) times daily. 03/17/20  Yes [name], MD  potassium chloride (K-DUR) 10 MEQ tablet Take 10 mEq by mouth daily.  03/25/15  Yes [provider]  temazepam (RESTORIL) 15 MG capsule Take 15 mg by mouth at bedtime. 12/28/19  Yes [provider]  traMADol (ULTRAM) 50 MG tablet Take 1 tablet (50 mg total) by mouth every 6 (six) hours as needed. 05/02/21  Yes Isla Pence, MD  XARELTO 15 MG TABS tablet TAKE 1 TABLET ONCE DAILY WITH SUPPER. Patient taking differently: 15 mg daily with supper. 12/02/20  Yes [name], MD    Inpatient Medications: Scheduled Meds:  digoxin  0.0625 mg Oral Daily   [START ON 05/03/2021] diltiazem  300 mg Oral Daily   insulin aspart  0-6 Units Subcutaneous TID WC   insulin aspart protamine- aspart  5 Units Subcutaneous Q breakfast   metoprolol  tartrate  25 mg Oral Once   metoprolol tartrate  25 mg Oral BID   sodium chloride flush  3 mL Intravenous Q12H   Continuous Infusions:  diltiazem (CARDIZEM) infusion Stopped (05/02/21 0936)   heparin     PRN Meds: acetaminophen **OR** acetaminophen, albuterol, morphine injection, ondansetron **OR** ondansetron (ZOFRAN) IV, temazepam  Allergies:    Allergies  Allergen Reactions   Ambien [Zolpidem Tartrate]     Hallucinations    Codeine Other (See Comments)    headache   Codeine Sulfate     REACTION: unspecified   Latex Hives   Remeron [Mirtazapine]     Social History:  Social History   Socioeconomic History   Marital status: Widowed    Spouse name: Not on file   Number of children: 2   Years of education: Not on file   Highest education level: Not on file  Occupational History   Occupation: English as a second language teacher, retired  Tobacco Use   Smoking status: Never   Smokeless tobacco: Never  Vaping Use   Vaping Use: Never used  Substance and Sexual Activity   Alcohol use: No   Drug use: No   Sexual activity: Not Currently  Other Topics Concern   Not on file  Social History Narrative   Husband had advancing Dementia, which was been very difficult for her. He died 2017/01/01.   She was scheduled to go into [facility] 11/02.   Social Determinants of Health   Financial Resource Strain: Not on file  Food Insecurity: Not on file  Transportation Needs: Not on file  Physical Activity: Not on file  Stress: Not on file  Social Connections: Not on file  Intimate Partner Violence: Not on file    Family History:    Family History  Problem Relation Age of Onset   Congestive Heart Failure Mother 61   Heart attack Father 43   Diabetes type II Father    Diabetes type II Brother        1/2   Multiple myeloma Brother        2/2   Diabetes type II Son    Colon cancer Neg Hx      ROS:  Please see the history of present illness.   All other ROS reviewed and  negative.     Physical Exam/Data:   Vitals:   05/02/21 1000 05/02/21 1020 05/02/21 1030 05/02/21 1100  BP: (!) 86/67  98/72 99/73  Pulse: 65 (!) 115 (!) 151 (!) 105  Resp: 17  (!) 24 (!) 26  Temp:      TempSrc:      SpO2: 98%  98% 98%  Weight:      Height:        Intake/Output Summary (Last 24 hours) at 05/02/2021 1141 Last data filed at 05/02/2021 1023 Gross per 24 hour  Intake 500 ml  Output --  Net 500 ml   Filed Weights   05/02/21 0529  Weight: 63.5 kg   Body mass index is 22.94 kg/m.  GENERAL:  Weeappearing HEENT:   Pupils equal round and reactive, fundi not visualized, oral mucosa unremarkable NECK:  No  jugular venous distention, waveform within normal limits, carotid upstroke brisk and symmetric, no bruits, no thyromegaly LYMPHATICS:  No cervical, inguinal adenopathy LUNGS:   Clear to auscultation bilaterally BACK:  No CVA tenderness CHEST:   Unremarkable HEART:  PMI not displaced or sustained,S1 and S2 within normal limits, no S3, no clicks, no rubs, no murmurs, irregular  ABD:  Flat, positive bowel sounds normal in frequency in pitch, no bruits, no rebound, no guarding, no midline pulsatile mass, no hepatomegaly, no splenomegaly EXT:  2 plus pulses throughout, no  edema, no cyanosis no clubbing SKIN:  No rashes no nodules NEURO:   Cranial nerves II through XII grossly intact, motor grossly intact throughout PSYCH:    Cognitively intact, oriented to person place and time   EKG:  The EKG was personally reviewed and demonstrates:  Atrial fib with RVR.  LAD.  Poor anterior R wave progression, no acute ST T wave changes Telemetry:  Telemetry was personally reviewed and demonstrates:  Atrial fib with RVR  Relevant CV Studies: None  Laboratory Data:  Chemistry Recent Labs  Lab 05/02/21 0506  NA 136  K 3.2*  CL 97*  CO2 30  GLUCOSE 228*  BUN 24*  CREATININE 0.90  CALCIUM 9.5  GFRNONAA 59*  ANIONGAP 9    No results for input(s): PROT, ALBUMIN, AST,  ALT, ALKPHOS, BILITOT in the last 168 hours. Hematology Recent Labs  Lab 05/02/21 0506  WBC 9.0  RBC 4.48  HGB 14.2  HCT 42.8  MCV 95.5  MCH 31.7  MCHC 33.2  RDW 13.2  PLT 190   Cardiac EnzymesNo results for input(s): TROPONINI in the last 168 hours. No results for input(s): TROPIPOC in the last 168 hours.  BNPNo results for input(s): BNP, PROBNP in the last 168 hours.  DDimer No results for input(s): DDIMER in the last 168 hours.  Radiology/Studies:  CT Head Wo Contrast  Result Date: 05/02/2021 CLINICAL DATA:  87 year old female status post fall on blood thinners. EXAM: CT HEAD WITHOUT CONTRAST TECHNIQUE: Contiguous axial images were obtained from the base of the skull through the vertex without intravenous contrast. COMPARISON:  Head CT 10/19/2019. FINDINGS: Brain: Stable cerebral volume from last year. No midline shift, ventriculomegaly, mass effect, evidence of mass lesion, intracranial hemorrhage or evidence of cortically based acute infarction. Stable gray-white matter differentiation throughout the brain. Patchy and confluent bilateral cerebral white matter hypodensity with mild heterogeneity of the deep gray nuclei. Vascular: Calcified atherosclerosis at the skull base. No suspicious intracranial vascular hyperdensity. Skull: Stable, intact. Sinuses/Orbits: Visualized paranasal sinuses and mastoids are stable and well aerated. Other: Proximal right humerus fracture evident on CT scout view, see dedicated right humerus series today. No orbit or scalp soft tissue injury identified. IMPRESSION: No acute intracranial abnormality. No acute traumatic injury to the head. Electronically Signed   By: Genevie Ann M.D.   On: 05/02/2021 07:07   CT Cervical Spine Wo Contrast  Result Date: 05/02/2021 CLINICAL DATA:  87 year old female status post fall on blood thinners. EXAM: CT CERVICAL SPINE WITHOUT CONTRAST TECHNIQUE: Multidetector CT imaging of the cervical spine was performed without  intravenous contrast. Multiplanar CT image reconstructions were also generated. COMPARISON:  Head CT today.  Cervical spine CT 10/19/2019. FINDINGS: Alignment: Stable. Trace anterolisthesis of C7 on T1. Bilateral posterior element alignment is within normal limits. Skull base and vertebrae: Visualized skull base is intact. No atlanto-occipital dissociation. Osteopenia. Stable C1-C2 alignment, and those levels appear stable and intact. No acute osseous abnormality identified. Soft tissues and spinal canal: No prevertebral fluid or swelling. No visible canal hematoma. Partially retropharyngeal carotids with bilateral calcified atherosclerosis. Otherwise negative for age visible noncontrast neck soft tissues. Disc levels: Capacious spinal canal despite widespread chronic cervical spine degeneration. Chronic interbody ankylosis C5-C6. Upper chest: Visible upper thoracic levels appears stable. Negative lung apices. IMPRESSION: 1. No acute traumatic injury identified in the cervical spine. 2. Chronic cervical spine degeneration. Electronically Signed   By: Genevie Ann M.D.   On: 05/02/2021 07:10   DG Pelvis Portable  Result Date: 05/02/2021 CLINICAL DATA:  Fall. EXAM: PORTABLE PELVIS 1-2 VIEWS COMPARISON:  March 15, 2017. FINDINGS: Status post intramedullary rod fixation of right femur. Old fractures are seen involving bilateral superior and inferior pubic rami. No definite acute fracture or dislocation is noted. IMPRESSION: Old postsurgical and posttraumatic changes as described above. No acute abnormality is noted. Electronically Signed   By: Marijo Conception M.D.   On: 05/02/2021 06:48   DG Chest  Portable 1 View  Result Date: 05/02/2021 CLINICAL DATA:  Fall. EXAM: PORTABLE CHEST 1 VIEW COMPARISON:  March 10, 2017. FINDINGS: Stable cardiomegaly. Old left rib fractures are noted. Lungs are clear. IMPRESSION: No acute cardiopulmonary abnormality seen. Aortic Atherosclerosis (ICD10-I70.0). Electronically Signed    By: Marijo Conception M.D.   On: 05/02/2021 06:43   DG Humerus Right  Result Date: 05/02/2021 CLINICAL DATA:  Fall. EXAM: RIGHT HUMERUS - 2+ VIEW COMPARISON:  March 10, 2017. FINDINGS: Severely displaced fracture is seen involving the proximal right humeral neck of indeterminate age. Old displaced acromial fracture is noted as well. IMPRESSION: Severely displaced proximal right humeral neck fracture of indeterminate age. Old displaced acromial fracture is noted as well. Electronically Signed   By: Marijo Conception M.D.   On: 05/02/2021 06:46    Assessment and Plan:   ATRIAL FIB:  Rate is increased but she is also in significant pain.  IV Dilt ordered given her labile BP and I would just continue this for now and hold on her PO meds.  I likely will restart some short acting PO Dilt tomorrow.  I spoke with nursing at the bedside to explain that we could accept some borderline BPs for rate control.  She has had 750 cc fluid bolus and I would not push this further given her history of diastolic dysfunction.    Given low BPs I would hold the beta blocker for today as well.  I would suggest continuing the dig.  Holding Xarelto and on heparin.    AORTIC DISSECTION:  This is chronic and not an active issue.    CHRONIC SYSTOLIC AND DIASTOLIC DYSFUNCTION:  Seems to be euvolemic.  Watch strict I/O and be judicious about volume resuscitation for low BPs.  PREOP:  The patient is at acceptable risk for the necessary shoulder surgery.  The most significant risk is her advanced age.  We can follow her rate which will be labile and we will need to follow her volume status closely.     For questions or updates, please contact [place] Please consult [email or web address] for contact info under Cardiology/STEMI.   Signed, [name], MD  05/02/2021 11:41 AM

## 2021-05-02 NOTE — ED Notes (Signed)
Notified Dr Tyrone Nine of pt's low bp. No new orders at this time.

## 2021-05-02 NOTE — ED Notes (Signed)
The pt has constant complaints  she s hot shes cold shes hurting  c/o nausea after she ate dinner

## 2021-05-02 NOTE — ED Notes (Signed)
Dr Tamala Julian is aware of pt's low bp, maintenance fluids were started.

## 2021-05-02 NOTE — Progress Notes (Signed)
ANTICOAGULATION CONSULT NOTE - Initial Consult  Pharmacy Consult for heparin Indication: atrial fibrillation  Allergies  Allergen Reactions   Ambien [Zolpidem Tartrate]     Hallucinations    Codeine Other (See Comments)    headache   Codeine Sulfate     REACTION: unspecified   Latex Hives   Remeron [Mirtazapine]     Patient Measurements: Height: 5' 5.5" (166.4 cm) Weight: 63.5 kg (140 lb) IBW/kg (Calculated) : 58.15 Heparin Dosing Weight: 63.5 kg  Vital Signs: Temp: 98.5 F (36.9 C) (12/10 0529) Temp Source: Temporal (12/10 0529) BP: 86/67 (12/10 1000) Pulse Rate: 115 (12/10 1020)  Labs: Recent Labs    05/02/21 0506  HGB 14.2  HCT 42.8  PLT 190  CREATININE 0.90    Estimated Creatinine Clearance: 33.6 mL/min (by C-G formula based on SCr of 0.9 mg/dL).   Medical History: Past Medical History:  Diagnosis Date   Aortic valve sclerosis    Echo, 2008   Arthritis    Bradycardia    October, 2012   Cancer [facility])    Chronic insomnia    Closed fracture of unspecified part of femur 2005   Diabetes mellitus, type 2 ([place])    Diverticulitis    Diverticulosis    Ejection fraction    EF 60%, echo, February, 2008  //   EF 65-70%, echo, November, 2012   Hyperlipidemia    Hypertension    Long term (current) use of anticoagulants    Osteopenia    Osteoporosis    femur fracture 2005, pelvic fracture 2006   Persistent atrial fibrillation ([facility])    Personal history of malignant neoplasm of breast    Syncope    Thoracic aortic aneurysm 02/2017   Unspecified closed fracture of pelvis 2006    Medications:  (Not in a hospital admission)   Assessment: 53 YOF who presents with non syncopal fall noted to have Afib with RVR. On Xarelto PTA with last dose taken on 12/9 at 7 PM. Pharmacy consulted to transition to IV heparin  H/H and Plt wnl   Goal of Therapy:  Heparin level 0.3-0.7 units/ml aPTT 66-102 seconds Monitor platelets by anticoagulation protocol: Yes    Plan:  -Start heparin infusion at 950 units/hr at 7 PM tonight -F/u 8 hr aPTT and HL after heparin start -Monitor daily aPTT, HL and s/s of bleeding   Albertina Parr, PharmD., BCPS, BCCCP Clinical Pharmacist Please refer to [facility] for unit-specific pharmacist

## 2021-05-02 NOTE — Discharge Instructions (Addendum)
Ice to the shoulder constantly.  Keep the incision covered and clean and dry for one week, then ok to get it wet in the shower. ° °Do exercise as instructed several times per day. ° °DO NOT reach behind your back or push up out of a chair with the operative arm. ° °Use a sling while you are up and around for comfort, may remove while seated.  Keep pillow propped behind the operative elbow. ° °Follow up with Dr Elizar Alpern in two weeks in the office, call [phone] for appt °

## 2021-05-02 NOTE — ED Notes (Signed)
Pt has 2+ right radial pulse, cap refill less than 3 sec, 1/5 right ggrip strength, sensation diminished, warm to touch.

## 2021-05-02 NOTE — ED Notes (Signed)
Per the nurse the tech was instructed to place a sling on the right injured shoulder of the patient. ED Tech Jeryn Bertoni gave instructions to the patient of placement of the sling. Patient tolerated the sling well. The shoulder was stabilized in the sling. Pt is placed in a supine position of comfort. Bed is locked in a low safe position call light within reach.

## 2021-05-02 NOTE — Consult Note (Signed)
Reason for Consult:Right proximal humerus fracture  Referring Physician: EDP  Tara Bradley is an 87 y.o. female.  HPI: 87 yo active and independent female who presents after an acute fall due to getting dizzy from taking sleeping medicine with right shoulder pain. Patient unable to move her arm after the injury. She has a history of prior left shoulder surgery years and years ago with resulting instability and weakness so her right arm is her go-to arm. She reports no pain or problems with the arm before this injury.   Past Medical History:  Diagnosis Date   Aortic valve sclerosis    Echo, 2008   Arthritis    Bradycardia    October, 2012   Cancer [facility])    Chronic insomnia    Closed fracture of unspecified part of femur 2005   Diabetes mellitus, type 2 ([place])    Diverticulitis    Diverticulosis    Ejection fraction    EF 60%, echo, February, 2008  //   EF 65-70%, echo, November, 2012   Hyperlipidemia    Hypertension    Long term (current) use of anticoagulants    Osteopenia    Osteoporosis    femur fracture 2005, pelvic fracture 2006   Persistent atrial fibrillation ([place])    Personal history of malignant neoplasm of breast    Syncope    Thoracic aortic aneurysm 02/2017   Unspecified closed fracture of pelvis 2006    Past Surgical History:  Procedure Laterality Date   CARDIOVERSION N/A 08/09/2012   Procedure: CARDIOVERSION;  Surgeon: Deboraha Sprang, MD;  Location: [place];  Service: Cardiovascular;  Laterality: N/A;   EYE SURGERY     FEMUR SURGERY  2005   ORIF   IR ANGIOGRAM PELVIS SELECTIVE OR SUPRASELECTIVE  03/11/2017   IR ANGIOGRAM SELECTIVE EACH ADDITIONAL VESSEL  03/11/2017   IR EMBO ART  VEN HEMORR LYMPH EXTRAV  INC GUIDE ROADMAPPING  03/11/2017   IR FLUORO GUIDE CV LINE RIGHT  03/11/2017   IR US GUIDE VASC ACCESS RIGHT  03/11/2017   IR US GUIDE VASC ACCESS RIGHT  03/11/2017   MASTECTOMY  1995   left   MINOR HARDWARE REMOVAL Left 09/01/2020   Procedure:  REMOVAL OF HARDWARE LEFT FOREARM;  Surgeon: Milly Jakob, MD;  Location: [place];  Service: Orthopedics;  Laterality: Left;   ORIF ULNAR FRACTURE Left 10/19/2019   Procedure: OPEN REDUCTION INTERNAL FIXATION (ORIF)  LEFT DISTAL RADIUS  AND ULNA FRACTURE;  Surgeon: Milly Jakob, MD;  Location: [place];  Service: Orthopedics;  Laterality: Left;   SHOULDER SURGERY     left   SHOULDER SURGERY  1979, 2003   TONSILLECTOMY     TOTAL ABDOMINAL HYSTERECTOMY W/ BILATERAL SALPINGOOPHORECTOMY  1995   WRIST SURGERY      x 2     Family History  Problem Relation Age of Onset   Congestive Heart Failure Mother 56   Heart attack Father 71   Diabetes type II Father    Diabetes type II Brother        1/2   Multiple myeloma Brother        2/2   Diabetes type II Son    Colon cancer Neg Hx     Social History:  reports that she has never smoked. She has never used smokeless tobacco. She reports that she does not drink alcohol and does not use drugs.  Allergies:  Allergies  Allergen Reactions   Ambien [Zolpidem Tartrate]  Hallucinations    Codeine Other (See Comments)    headache   Codeine Sulfate     REACTION: unspecified   Latex Hives   Remeron [Mirtazapine]     Medications: I have reviewed the patient's current medications.  Results for orders placed or performed during the hospital encounter of 05/02/21 (from the past 48 hour(s))  Basic metabolic panel     Status: Abnormal   Collection Time: 05/02/21  5:06 AM  Result Value Ref Range   Sodium 136 135 - 145 mmol/L   Potassium 3.2 (L) 3.5 - 5.1 mmol/L   Chloride 97 (L) 98 - 111 mmol/L   CO2 30 22 - 32 mmol/L   Glucose, Bld 228 (H) 70 - 99 mg/dL    Comment: Glucose reference range applies only to samples taken after fasting for at least 8 hours.   BUN 24 (H) 8 - 23 mg/dL   Creatinine, Ser 0.90 0.44 - 1.00 mg/dL   Calcium 9.5 8.9 - 10.3 mg/dL   GFR, Estimated 59 (L) >60 mL/min    Comment: (NOTE) Calculated  using the CKD-EPI Creatinine Equation (2021)    Anion gap 9 5 - 15    Comment: Performed at [facility] [address], [place], [place] [address]  CBC with Differential     Status: None   Collection Time: 05/02/21  5:06 AM  Result Value Ref Range   WBC 9.0 4.0 - 10.5 K/uL   RBC 4.48 3.87 - 5.11 MIL/uL   Hemoglobin 14.2 12.0 - 15.0 g/dL   HCT 42.8 36.0 - 46.0 %   MCV 95.5 80.0 - 100.0 fL   MCH 31.7 26.0 - 34.0 pg   MCHC 33.2 30.0 - 36.0 g/dL   RDW 13.2 11.5 - 15.5 %   Platelets 190 150 - 400 K/uL   nRBC 0.0 0.0 - 0.2 %   Neutrophils Relative % 71 %   Neutro Abs 6.4 1.7 - 7.7 K/uL   Lymphocytes Relative 17 %   Lymphs Abs 1.6 0.7 - 4.0 K/uL   Monocytes Relative 9 %   Monocytes Absolute 0.8 0.1 - 1.0 K/uL   Eosinophils Relative 1 %   Eosinophils Absolute 0.1 0.0 - 0.5 K/uL   Basophils Relative 1 %   Basophils Absolute 0.1 0.0 - 0.1 K/uL   Immature Granulocytes 1 %   Abs Immature Granulocytes 0.06 0.00 - 0.07 K/uL    Comment: Performed at [facility], [address] [place], [place] [address]  Magnesium     Status: None   Collection Time: 05/02/21  5:06 AM  Result Value Ref Range   Magnesium 1.9 1.7 - 2.4 mg/dL    Comment: Performed at [facility] [address], [place], [place] [address]  Digoxin level     Status: Abnormal   Collection Time: 05/02/21  9:11 AM  Result Value Ref Range   Digoxin Level 0.7 (L) 0.8 - 2.0 ng/mL    Comment: Performed at [facility], [facility] [address], [place], [place] [address]    CT Head Wo Contrast  Result Date: 05/02/2021 CLINICAL DATA:  87 year old female status post fall on blood thinners. EXAM: CT HEAD WITHOUT CONTRAST TECHNIQUE: Contiguous axial images were obtained from the base of the skull through the vertex without intravenous contrast. COMPARISON:  Head CT 10/19/2019. FINDINGS: Brain: Stable cerebral volume from last year. No midline shift, ventriculomegaly, mass effect, evidence of mass lesion, intracranial  hemorrhage or evidence of  cortically based acute infarction. Stable gray-white matter differentiation throughout the brain. Patchy and confluent bilateral cerebral white matter hypodensity with mild heterogeneity of the deep gray nuclei. Vascular: Calcified atherosclerosis at the skull base. No suspicious intracranial vascular hyperdensity. Skull: Stable, intact. Sinuses/Orbits: Visualized paranasal sinuses and mastoids are stable and well aerated. Other: Proximal right humerus fracture evident on CT scout view, see dedicated right humerus series today. No orbit or scalp soft tissue injury identified. IMPRESSION: No acute intracranial abnormality. No acute traumatic injury to the head. Electronically Signed   By: Genevie Ann M.D.   On: 05/02/2021 07:07   CT Cervical Spine Wo Contrast  Result Date: 05/02/2021 CLINICAL DATA:  87 year old female status post fall on blood thinners. EXAM: CT CERVICAL SPINE WITHOUT CONTRAST TECHNIQUE: Multidetector CT imaging of the cervical spine was performed without intravenous contrast. Multiplanar CT image reconstructions were also generated. COMPARISON:  Head CT today.  Cervical spine CT 10/19/2019. FINDINGS: Alignment: Stable. Trace anterolisthesis of C7 on T1. Bilateral posterior element alignment is within normal limits. Skull base and vertebrae: Visualized skull base is intact. No atlanto-occipital dissociation. Osteopenia. Stable C1-C2 alignment, and those levels appear stable and intact. No acute osseous abnormality identified. Soft tissues and spinal canal: No prevertebral fluid or swelling. No visible canal hematoma. Partially retropharyngeal carotids with bilateral calcified atherosclerosis. Otherwise negative for age visible noncontrast neck soft tissues. Disc levels: Capacious spinal canal despite widespread chronic cervical spine degeneration. Chronic interbody ankylosis C5-C6. Upper chest: Visible upper thoracic levels appears stable. Negative lung apices. IMPRESSION:  1. No acute traumatic injury identified in the cervical spine. 2. Chronic cervical spine degeneration. Electronically Signed   By: Genevie Ann M.D.   On: 05/02/2021 07:10   DG Pelvis Portable  Result Date: 05/02/2021 CLINICAL DATA:  Fall. EXAM: PORTABLE PELVIS 1-2 VIEWS COMPARISON:  March 15, 2017. FINDINGS: Status post intramedullary rod fixation of right femur. Old fractures are seen involving bilateral superior and inferior pubic rami. No definite acute fracture or dislocation is noted. IMPRESSION: Old postsurgical and posttraumatic changes as described above. No acute abnormality is noted. Electronically Signed   By: Marijo Conception M.D.   On: 05/02/2021 06:48   DG Chest Portable 1 View  Result Date: 05/02/2021 CLINICAL DATA:  Fall. EXAM: PORTABLE CHEST 1 VIEW COMPARISON:  March 10, 2017. FINDINGS: Stable cardiomegaly. Old left rib fractures are noted. Lungs are clear. IMPRESSION: No acute cardiopulmonary abnormality seen. Aortic Atherosclerosis (ICD10-I70.0). Electronically Signed   By: Marijo Conception M.D.   On: 05/02/2021 06:43   DG Humerus Right  Result Date: 05/02/2021 CLINICAL DATA:  Fall. EXAM: RIGHT HUMERUS - 2+ VIEW COMPARISON:  March 10, 2017. FINDINGS: Severely displaced fracture is seen involving the proximal right humeral neck of indeterminate age. Old displaced acromial fracture is noted as well. IMPRESSION: Severely displaced proximal right humeral neck fracture of indeterminate age. Old displaced acromial fracture is noted as well. Electronically Signed   By: Marijo Conception M.D.   On: 05/02/2021 06:46    Review of Systems Blood pressure (!) 86/67, pulse (!) 115, temperature 98.5 F (36.9 C), temperature source Temporal, resp. rate 17, height 5' 5.5" (1.664 m), weight 63.5 kg, SpO2 98 %. Physical Exam Awake and alert and oriented. Neck non tender. Chest non tender with normal chest rise. Heart is irregularly irregular Right shoulder is swollen and tender to palpation and  there is pain with any movement of the arm. Elbow and wrist are non swollen and non tender Left  shoulder is a little swollen but not tender and no pain with movement. She has a healed old deltopectoral incision on the left side.  Normal elbow and wrist ROM and normal strength on the left.  Bilateral LEs with pain free AROM and 5/5 strength L spine non tender  Assessment/Plan: Right acute surgical neck fracture of the humerus.  Old Right displaced acromial fracture After discussing options with the patient, based upon her severe pain and dependence on the right shoulder and the poor function with the left one, she would be best served with ORIF. Patient agrees. This will allow her to be independent more quickly and will help with pain control. The acromion fracture did no seem to limit her and will be ignored in this.  She is on Xarelto and will need to be off of this for a few days prior to surgery.  Patient will also need her A Fib addressed and stable prior to medical clearance for surgery.   Augustin Schooling 05/02/2021, 10:44 AM

## 2021-05-02 NOTE — ED Notes (Signed)
Patient ambulated well in hallway. O2 read at 93% without oxygen. RN and MD are aware.

## 2021-05-02 NOTE — H&P (Addendum)
History and Physical    Tara Bradley WUJ:[id number] DOB: Jul 12, 1922 DOA: 05/02/2021  Referring MD/NP/PA: Deno Etienne, DO PCP: [name], MD  Patient coming from: home (lives at home alone) via EMS  Chief Complaint: Shoulder pain after fall  I have personally briefly reviewed patient's old medical records in [place]   HPI: Tara Bradley is a 87 y.o. female with medical history significant of hypertension, hyperlipidemia, permanent atrial fibrillation on anticoagulation, and insomnia who presents after having a fall at home.  At baseline patient lives alone, is able to get around without assistance in her home, and uses a walker when going longer distances.  Patient notes that she is mostly right-handed, but states that she is ambidextrous.  She notes that she had not been sleeping for the last few weeks to months after being taken off of temazepam because it has not worked, but had requested to be restarted on it.  Last night she took her previously prescribed since being off of it.  She reports being able to sleep, but woke up in the middle night disoriented and dizzy which led to her falling out of bed.  Denies having any loss of consciousness, but landed on her right shoulder and reported having severe pain.  She was able to get to her life alert button to call for help.  Patient has noticed that lately her heart rates have intermittently been going up into the 140s, but would come down whenever she rechecked.  Patient otherwise has been taking all of her medications as prescribed.  She had plans to travel to [place] this coming Monday.  Patient notes that she had previously had tried melatonin, CBD Gummies, and multiple other things that had not helped her sleep before in the past.  ED Course: Upon admission into the emergency department patient was seen and afebrile,  heart rates elevated up into the 150s in atrial fibrillation, blood pressures 89/55-132/80, and O2 saturations  as low as 91% with improvement on 2 L of nasal cannula oxygen.  Labs significant for potassium 3.2, BUN 24, creatinine 0.9, and glucose 228.  X-rays of the right humerus revealed a severely displaced proximal right femoral neck fracture with old displaced acromial fracture.  Chest x-ray, pelvis x-ray, CT head, and CT neck showed no acute abnormality.  Patient had been started on a diltiazem drip after being given an initial bolus of 20 mg IV.she had also been ordered home diltiazem 300 mg p.o. and metoprolol 25 mg p.o., but never given due to low blood pressures.  Other medications given include Zofran, morphine 2 mg IV, normal saline 500 mL IV, and acetaminophen 1000 mg p.o..  TRH called to admit and accepted to a progressive bed  Review of Systems  Constitutional:  Negative for fever.  HENT:  Negative for ear discharge.   Eyes:  Negative for photophobia and pain.  Respiratory:  Negative for cough and shortness of breath.   Cardiovascular:  Negative for chest pain and leg swelling.  Gastrointestinal:  Negative for abdominal pain, nausea and vomiting.  Genitourinary:  Negative for hematuria.  Musculoskeletal:  Positive for falls and joint pain.  Skin:  Negative for rash.  Neurological:  Positive for dizziness. Negative for loss of consciousness.  Psychiatric/Behavioral:  Negative for substance abuse. The patient has insomnia.    Past Medical History:  Diagnosis Date   Aortic valve sclerosis    Echo, 2008   Arthritis    Bradycardia    October,  2012   Cancer [facility]    Chronic insomnia    Closed fracture of unspecified part of femur 2005   Diabetes mellitus, type 2 ([place])    Diverticulitis    Diverticulosis    Ejection fraction    EF 60%, echo, February, 2008  //   EF 65-70%, echo, November, 2012   Hyperlipidemia    Hypertension    Long term (current) use of anticoagulants    Osteopenia    Osteoporosis    femur fracture 2005, pelvic fracture 2006   Persistent atrial fibrillation ([place])     Personal history of malignant neoplasm of breast    Syncope    Thoracic aortic aneurysm 02/2017   Unspecified closed fracture of pelvis 2006    Past Surgical History:  Procedure Laterality Date   CARDIOVERSION N/A 08/09/2012   Procedure: CARDIOVERSION;  Surgeon: Deboraha Sprang, MD;  Location: [place];  Service: Cardiovascular;  Laterality: N/A;   EYE SURGERY     FEMUR SURGERY  2005   ORIF   IR ANGIOGRAM PELVIS SELECTIVE OR SUPRASELECTIVE  03/11/2017   IR ANGIOGRAM SELECTIVE EACH ADDITIONAL VESSEL  03/11/2017   IR EMBO ART  VEN HEMORR LYMPH EXTRAV  INC GUIDE ROADMAPPING  03/11/2017   IR FLUORO GUIDE CV LINE RIGHT  03/11/2017   IR US GUIDE VASC ACCESS RIGHT  03/11/2017   IR US GUIDE VASC ACCESS RIGHT  03/11/2017   MASTECTOMY  1995   left   MINOR HARDWARE REMOVAL Left 09/01/2020   Procedure: REMOVAL OF HARDWARE LEFT FOREARM;  Surgeon: Milly Jakob, MD;  Location: [place];  Service: Orthopedics;  Laterality: Left;   ORIF ULNAR FRACTURE Left 10/19/2019   Procedure: OPEN REDUCTION INTERNAL FIXATION (ORIF)  LEFT DISTAL RADIUS  AND ULNA FRACTURE;  Surgeon: Milly Jakob, MD;  Location: [place];  Service: Orthopedics;  Laterality: Left;   SHOULDER SURGERY     left   SHOULDER SURGERY  1979, 2003   TONSILLECTOMY     TOTAL ABDOMINAL HYSTERECTOMY W/ BILATERAL SALPINGOOPHORECTOMY  1995   WRIST SURGERY      x 2      reports that she has never smoked. She has never used smokeless tobacco. She reports that she does not drink alcohol and does not use drugs.  Allergies  Allergen Reactions   Ambien [Zolpidem Tartrate]     Hallucinations    Codeine Other (See Comments)    headache   Codeine Sulfate     REACTION: unspecified   Latex Hives   Remeron [Mirtazapine]     Family History  Problem Relation Age of Onset   Congestive Heart Failure Mother 35   Heart attack Father 80   Diabetes type II Father    Diabetes type II Brother        1/2   Multiple myeloma  Brother        2/2   Diabetes type II Son    Colon cancer Neg Hx     Prior to Admission medications   Medication Sig Start Date End Date Taking? Authorizing Provider  acetaminophen (TYLENOL) 325 MG tablet Take 2 tablets (650 mg total) by mouth every 6 (six) hours. Patient taking differently: Take 650 mg by mouth every 6 (six) hours as needed for mild pain or headache. 10/20/19  Yes Milly Jakob, MD  BD PEN NEEDLE NANO U/F 32G X 4 MM MISC Inject into the skin daily. 11/25/20  Yes [provider]  Calcium Carbonate-Vitamin D 600-400 MG-UNIT  tablet Take 1 tablet by mouth daily.   Yes [provider]  Cholecalciferol (VITAMIN D) 2000 UNITS tablet Take 1,000 Units by mouth daily.   Yes [provider]  digoxin (LANOXIN) 0.125 MG tablet TAKE (1/2) TABLET DAILY. Patient taking differently: Take 0.0625 mg by mouth daily. 03/31/20  Yes [name], MD  diltiazem (CARDIZEM CD) 300 MG 24 hr capsule TAKE ONE CAPSULE BY MOUTH EVERY DAY Patient taking differently: Take 300 mg by mouth daily. 01/28/21  Yes [name], MD  furosemide (LASIX) 40 MG tablet Take 40 mg by mouth daily.    Yes [provider]  HUMALOG MIX 50/50 KWIKPEN (50-50) 100 UNIT/ML KwikPen Inject 5 Units into the skin daily. 04/28/21  Yes [provider]  losartan-hydrochlorothiazide (HYZAAR) 100-12.5 MG tablet Take 1 tablet by mouth every morning. 09/27/17  Yes [provider]  metFORMIN (GLUMETZA) 500 MG (MOD) 24 hr tablet Take 1,000 mg by mouth every evening.   Yes [provider]  metoprolol tartrate (LOPRESSOR) 25 MG tablet TAKE 1 TABLET BY MOUTH TWICE DAILY. Patient taking differently: Take 25 mg by mouth 2 (two) times daily. 03/17/20  Yes [name], MD  potassium chloride (K-DUR) 10 MEQ tablet Take 10 mEq by mouth daily.  03/25/15  Yes [provider]  temazepam (RESTORIL) 15 MG capsule Take 15 mg by mouth at bedtime. 12/28/19  Yes [provider]   traMADol (ULTRAM) 50 MG tablet Take 1 tablet (50 mg total) by mouth every 6 (six) hours as needed. 05/02/21  Yes Isla Pence, MD  XARELTO 15 MG TABS tablet TAKE 1 TABLET ONCE DAILY WITH SUPPER. Patient taking differently: 15 mg daily with supper. 12/02/20  Yes [name], MD    Physical Exam:  Constitutional: Elderly female who appears to be in discomfort Vitals:   05/02/21 0804 05/02/21 0805 05/02/21 0815 05/02/21 0835  BP:   111/83 120/71  Pulse: (!) 109 64 (!) 142 (!) 108  Resp: (!) 22 (!) 22 19 (!) 21  Temp:      TempSrc:      SpO2: 99% 98% 97% 92%  Weight:      Height:       Eyes: PERRL, lids and conjunctivae normal ENMT: Mucous membranes are moist. Posterior pharynx clear of any exudate or lesions.  Neck: normal, supple, no masses, no thyromegaly Respiratory: clear to auscultation bilaterally, no wheezing, no crackles. Normal respiratory effort. No accessory muscle use.  Cardiovascular: Irregular with positive murmur.  No significant lower extremity swelling. Abdomen: no tenderness, no masses palpated. No hepatosplenomegaly. Bowel sounds positive.  Musculoskeletal: no clubbing / cyanosis.  Deformity noted of the right shoulder swollen and painful to the touch and with any movement. Skin: n healed surgical scar left shoulder. Neurologic: CN 2-12 grossly intact. Sensation intact, DTR normal. Strength 5/5 in all 4.  Psychiatric: Normal judgment and insight. Alert and oriented x 3. Normal mood.     Labs on Admission: I have personally reviewed following labs and imaging studies  CBC: Recent Labs  Lab 05/02/21 0506  WBC 9.0  NEUTROABS 6.4  HGB 14.2  HCT 42.8  MCV 95.5  PLT 882   Basic Metabolic Panel: Recent Labs  Lab 05/02/21 0506  NA 136  K 3.2*  CL 97*  CO2 30  GLUCOSE 228*  BUN 24*  CREATININE 0.90  CALCIUM 9.5   GFR: Estimated Creatinine Clearance: 33.6 mL/min (by C-G formula based on SCr of 0.9 mg/dL). Liver Function Tests: No results for  input(s): AST, ALT, ALKPHOS, BILITOT, PROT, ALBUMIN in the last 168 hours. No results for input(s): LIPASE, AMYLASE in the last 168 hours. No results for input(s): AMMONIA in the last 168 hours. Coagulation Profile: No results for input(s): INR, PROTIME in the last 168 hours. Cardiac Enzymes: No results for input(s): CKTOTAL, CKMB, CKMBINDEX, TROPONINI in the last 168 hours. BNP (last 3 results) No results for input(s): PROBNP in the last 8760 hours. HbA1C: No results for input(s): HGBA1C in the last 72 hours. CBG: No results for input(s): GLUCAP in the last 168 hours. Lipid Profile: No results for input(s): CHOL, HDL, LDLCALC, TRIG, CHOLHDL, LDLDIRECT in the last 72 hours. Thyroid Function Tests: No results for input(s): TSH, T4TOTAL, FREET4, T3FREE, THYROIDAB in the last 72 hours. Anemia Panel: No results for input(s): VITAMINB12, FOLATE, FERRITIN, TIBC, IRON, RETICCTPCT in the last 72 hours. Urine analysis:    Component Value Date/Time   COLORURINE YELLOW 03/10/2017 2121   APPEARANCEUR HAZY (A) 03/10/2017 2121   LABSPEC 1.015 03/10/2017 2121   PHURINE 7.0 03/10/2017 2121   GLUCOSEU NEGATIVE 03/10/2017 2121   HGBUR NEGATIVE 03/10/2017 2121   BILIRUBINUR NEGATIVE 03/10/2017 2121   BILIRUBINUR n 07/23/2013 1343   KETONESUR 5 (A) 03/10/2017 2121   PROTEINUR NEGATIVE 03/10/2017 2121   UROBILINOGEN 0.2 07/23/2013 1343   UROBILINOGEN 0.2 03/07/2012 2123   NITRITE NEGATIVE 03/10/2017 2121   LEUKOCYTESUR NEGATIVE 03/10/2017 2121   Sepsis Labs: No results found for this or any previous visit (from the past 240 hour(s)).   Radiological Exams on Admission: CT Head Wo Contrast  Result Date: 05/02/2021 CLINICAL DATA:  87 year old female status post fall on blood thinners. EXAM: CT HEAD WITHOUT CONTRAST TECHNIQUE: Contiguous axial images were obtained from the base of the skull through the vertex without intravenous contrast. COMPARISON:  Head CT 10/19/2019. FINDINGS: Brain: Stable  cerebral volume from last year. No midline shift, ventriculomegaly, mass effect, evidence of mass lesion, intracranial hemorrhage or evidence of cortically based acute infarction. Stable gray-white matter differentiation throughout the brain. Patchy and confluent bilateral cerebral white matter hypodensity with mild heterogeneity of the deep gray nuclei. Vascular: Calcified atherosclerosis at the skull base. No suspicious intracranial vascular hyperdensity. Skull: Stable, intact. Sinuses/Orbits: Visualized paranasal sinuses and mastoids are stable and well aerated. Other: Proximal right humerus fracture evident on CT scout view, see dedicated right humerus series today. No orbit or scalp soft tissue injury identified. IMPRESSION: No acute intracranial abnormality. No acute traumatic injury to the head. Electronically Signed   By: Genevie Ann M.D.   On: 05/02/2021 07:07   CT Cervical Spine Wo Contrast  Result Date: 05/02/2021 CLINICAL DATA:  87 year old female status post fall on blood thinners. EXAM: CT CERVICAL SPINE WITHOUT CONTRAST TECHNIQUE: Multidetector CT imaging of the cervical spine was performed without intravenous contrast. Multiplanar CT image reconstructions were also generated. COMPARISON:  Head CT today.  Cervical spine CT 10/19/2019. FINDINGS: Alignment: Stable. Trace anterolisthesis of C7 on T1. Bilateral posterior element alignment is within normal limits. Skull base and vertebrae: Visualized skull base is intact. No atlanto-occipital dissociation. Osteopenia. Stable C1-C2 alignment, and those levels appear stable and intact. No acute osseous abnormality identified. Soft tissues and spinal canal: No prevertebral fluid or swelling. No visible canal hematoma. Partially retropharyngeal carotids with bilateral calcified atherosclerosis. Otherwise negative for age visible noncontrast neck soft tissues. Disc levels: Capacious spinal canal despite widespread chronic cervical spine degeneration. Chronic  interbody ankylosis C5-C6. Upper chest: Visible upper thoracic levels appears stable. Negative lung apices.  IMPRESSION: 1. No acute traumatic injury identified in the cervical spine. 2. Chronic cervical spine degeneration. Electronically Signed   By: Genevie Ann M.D.   On: 05/02/2021 07:10   DG Pelvis Portable  Result Date: 05/02/2021 CLINICAL DATA:  Fall. EXAM: PORTABLE PELVIS 1-2 VIEWS COMPARISON:  March 15, 2017. FINDINGS: Status post intramedullary rod fixation of right femur. Old fractures are seen involving bilateral superior and inferior pubic rami. No definite acute fracture or dislocation is noted. IMPRESSION: Old postsurgical and posttraumatic changes as described above. No acute abnormality is noted. Electronically Signed   By: Marijo Conception M.D.   On: 05/02/2021 06:48   DG Chest Portable 1 View  Result Date: 05/02/2021 CLINICAL DATA:  Fall. EXAM: PORTABLE CHEST 1 VIEW COMPARISON:  March 10, 2017. FINDINGS: Stable cardiomegaly. Old left rib fractures are noted. Lungs are clear. IMPRESSION: No acute cardiopulmonary abnormality seen. Aortic Atherosclerosis (ICD10-I70.0). Electronically Signed   By: Marijo Conception M.D.   On: 05/02/2021 06:43   DG Humerus Right  Result Date: 05/02/2021 CLINICAL DATA:  Fall. EXAM: RIGHT HUMERUS - 2+ VIEW COMPARISON:  March 10, 2017. FINDINGS: Severely displaced fracture is seen involving the proximal right humeral neck of indeterminate age. Old displaced acromial fracture is noted as well. IMPRESSION: Severely displaced proximal right humeral neck fracture of indeterminate age. Old displaced acromial fracture is noted as well. Electronically Signed   By: Marijo Conception M.D.   On: 05/02/2021 06:46    EKG: Independently reviewed.  Atrial fibrillation with RVR at 138 bpm  Assessment/Plan Right humerus fracture secondary to fall: Patient presents after having a fall landing on her right shoulder secondary to being disoriented and dizzy after taking  temazepam last night for the first time since it previously had been discontinued.  Unclear if fall was provoked from medication and/or uncontrolled atrial fibrillation. -Admit to a progressive bed -Continue right arm in sling -Hydrocodone/morphine IV as needed for pain -PT/OT to evaluate and treat -[facility] consulted, will follow-up for further recommendations  Persistant atrial fibrillation on chronic anticoagulation: Patient was noted to be in atrial fibrillation with heart rates elevated into the 150s.  Digoxin level was 0.7.  She had been given a bolus of diltiazem and started on a drip.  Initial home medications of Toprol and diltiazem p.o. have been ordered, but not given due to hypotension.  CHA2DS2-VASc score is equal to at least 6 for which anticoagulation would be recommended. -Continue digoxin -Continue Cardizem drip as needed -Hold Xarelto -Heparin per pharmacy for bridging given elevated risk -Cardiology consulted, will follow-up for further recommendations  Hypokalemia: Acute.  Initial potassium 3.2 on admission.  Patient is on scheduled potassium supplementation of 10 mEq daily. -Give 60 mEq p.o. x1 dose now -Continue to monitor and replace as needed  Transient hypotension: Blood pressures are temporarily as low as 89/55.  Home blood pressure medications include digoxin, Cardizem 300 mg daily, furosemide 40 mg daily, losartan-hydrochlorothiazide 100-12.5 mg, and metoprolol 25 mg twice daily. -Held furosemide and losartan hydrochlorothiazide due blood pressures -Gentle IV fluids at 75 mL/h for 1 L to maintain maps greater than 65, but  will bolus IV fluids as needed.  Diabetes mellitus type 2: On admission glucose elevated up to 228.  Home medication regimen includes Humalog 50/50 mix 5 units daily, and metformin 1000 mg q. at bedtime. -Hypoglycemic protocols -Check hemoglobin A1c -Hold metformin -Continue Humalog 50-50 mix -CBGs before every meal with very sensitive SSI  persistent  Heart failure with  preserved EF: On admission patient does not appear to be volume overloaded.  Chest x-ray noted cardiomegaly without signs of edema or infiltrate..  Last echocardiogram revealed EF of 60 to 65% with indeterminate diastolic dysfunction 12/3417. -Strict I&Os -Daily weight -Held diuretics initially as patient may have been over diuresed on current regimen which included hydrochlorothiazide and furosemide.  Osteoporosis: Diagnosed after having femur fracture in 2005 and pelvic fracture in 2006.  Insomnia: Chronic.  Patient had recently been restarted on temazepam 15 mg nightly, but thought possibly related to fall last night. -Temazepam dose reduced to 7.5 mg, but may need to be discontinued if patient becomes disoriented -Bed alarm on -Fall precautions   DVT prophylaxis: Xarelto Code Status: Patient states that she would like to remain a full code for now as she hopes to go to [place] Family Communication: Friend updated at bedside Disposition Plan: To be determined Consults called: Orthopedics, cardiology Admission status: Inpatient require more than 2 midnight stay  Norval Morton MD [facility]   If 7PM-7AM, please contact night-coverage   05/02/2021, 8:54 AM

## 2021-05-02 NOTE — ED Provider Notes (Signed)
I see the patient in signout from Dr. Gilford Raid.  Briefly the patient is a 87 year old female who had what sounds ago nonsyncopal fall and landed on her right shoulder.  Complaining mostly of right shoulder pain.  Found to have a proximal humerus fracture.  Plan for ambulation.  Of note the patient was also noted to have A. fib with RVR with rates in the 150s and borderline hypoxia and placed on 2 L of oxygen.  The patient was able to ambulate but felt somewhat terrible.  No hypoxia with ambulation but went into the upper 80s when she got back in bed.  Placed on 2 L of oxygen.  Heart rate still going into the 150s and 160s.  Will start on a diltiazem infusion.  Will discuss with medicine.     Deno Etienne, DO 05/02/21 [phone]

## 2021-05-02 NOTE — ED Triage Notes (Signed)
Patient presents to ed via [facility] states she took a new medication last pm for sleep , states she has taken it in the past. States she got up on the wrong side of her bed and fell onto carpeted floor, landed on her right shoulder . Obv. Deformity to right humerus. Positive right radial pulse. Patient is on a blood thinner. Patient is alert oriented.
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
## 2021-05-03 ENCOUNTER — Inpatient Hospital Stay (HOSPITAL_COMMUNITY): Payer: Medicare Other

## 2021-05-03 ENCOUNTER — Encounter (HOSPITAL_COMMUNITY): Payer: Self-pay | Admitting: Internal Medicine

## 2021-05-03 DIAGNOSIS — I4891 Unspecified atrial fibrillation: Secondary | ICD-10-CM

## 2021-05-03 DIAGNOSIS — E785 Hyperlipidemia, unspecified: Secondary | ICD-10-CM

## 2021-05-03 LAB — CBC
HCT: 34.7 % — ABNORMAL LOW (ref 36.0–46.0)
Hemoglobin: 11.6 g/dL — ABNORMAL LOW (ref 12.0–15.0)
MCH: 32.3 pg (ref 26.0–34.0)
MCHC: 33.4 g/dL (ref 30.0–36.0)
MCV: 96.7 fL (ref 80.0–100.0)
Platelets: 157 10*3/uL (ref 150–400)
RBC: 3.59 MIL/uL — ABNORMAL LOW (ref 3.87–5.11)
RDW: 13.6 % (ref 11.5–15.5)
WBC: 6.4 10*3/uL (ref 4.0–10.5)
nRBC: 0 % (ref 0.0–0.2)

## 2021-05-03 LAB — URINALYSIS, ROUTINE W REFLEX MICROSCOPIC
Bilirubin Urine: NEGATIVE
Glucose, UA: 150 mg/dL — AB
Hgb urine dipstick: NEGATIVE
Ketones, ur: 5 mg/dL — AB
Leukocytes,Ua: NEGATIVE
Nitrite: NEGATIVE
Protein, ur: NEGATIVE mg/dL
Specific Gravity, Urine: 1.021 (ref 1.005–1.030)
pH: 6 (ref 5.0–8.0)

## 2021-05-03 LAB — BASIC METABOLIC PANEL
Anion gap: 9 (ref 5–15)
BUN: 17 mg/dL (ref 8–23)
CO2: 23 mmol/L (ref 22–32)
Calcium: 7.9 mg/dL — ABNORMAL LOW (ref 8.9–10.3)
Chloride: 100 mmol/L (ref 98–111)
Creatinine, Ser: 0.68 mg/dL (ref 0.44–1.00)
GFR, Estimated: >60 mL/min (ref >60)
Glucose, Bld: 190 mg/dL — ABNORMAL HIGH (ref 70–99)
Potassium: 4.2 mmol/L (ref 3.5–5.1)
Sodium: 132 mmol/L — ABNORMAL LOW (ref 135–145)

## 2021-05-03 LAB — APTT
aPTT: 56 seconds — ABNORMAL HIGH (ref 24–36)
aPTT: 70 s — ABNORMAL HIGH (ref 24–36)

## 2021-05-03 LAB — HEMOGLOBIN A1C
Hgb A1c MFr Bld: 8.6 % — ABNORMAL HIGH (ref 4.8–5.6)
Mean Plasma Glucose: 200.12 mg/dL

## 2021-05-03 LAB — HEPARIN LEVEL (UNFRACTIONATED): Heparin Unfractionated: 0.64 [IU]/mL (ref 0.30–0.70)

## 2021-05-03 LAB — GLUCOSE, CAPILLARY
Glucose-Capillary: 170 mg/dL — ABNORMAL HIGH (ref 70–99)
Glucose-Capillary: 146 mg/dL — ABNORMAL HIGH (ref 70–99)
Glucose-Capillary: 175 mg/dL — ABNORMAL HIGH (ref 70–99)
Glucose-Capillary: 150 mg/dL — ABNORMAL HIGH (ref 70–99)

## 2021-05-03 MED ORDER — SENNOSIDES-DOCUSATE SODIUM 8.6-50 MG PO TABS
1.0000 | ORAL_TABLET | Freq: Two times a day (BID) | ORAL | Status: DC
  Administered 2021-05-03 – 2021-05-09 (×13): 1 via ORAL
  Filled 2021-05-03 (×15): qty 1

## 2021-05-03 MED ORDER — BISACODYL 10 MG RE SUPP
10.0000 mg | Freq: Every day | RECTAL | Status: DC | PRN
  Filled 2021-05-03 (×2): qty 1

## 2021-05-03 MED ORDER — INSULIN DETEMIR 100 UNIT/ML ~~LOC~~ SOLN
5.0000 [IU] | Freq: Every day | SUBCUTANEOUS | Status: DC
  Administered 2021-05-04 – 2021-05-09 (×6): 5 [IU] via SUBCUTANEOUS
  Filled 2021-05-03 (×6): qty 0.05

## 2021-05-03 MED ORDER — TRAZODONE HCL 50 MG PO TABS: 50.0000 mg | ORAL_TABLET | Freq: Every evening | ORAL | Status: DC | PRN

## 2021-05-03 MED ORDER — TRANEXAMIC ACID-NACL 1000-0.7 MG/100ML-% IV SOLN
1000.0000 mg | INTRAVENOUS | Status: AC
  Administered 2021-05-04: 1000 mg via INTRAVENOUS
  Filled 2021-05-03 (×2): qty 100

## 2021-05-03 MED ORDER — POLYETHYLENE GLYCOL 3350 17 G PO PACK
17.0000 g | PACK | Freq: Every day | ORAL | Status: DC
  Administered 2021-05-03 – 2021-05-09 (×4): 17 g via ORAL
  Filled 2021-05-03 (×8): qty 1

## 2021-05-03 MED ORDER — CEFAZOLIN SODIUM-DEXTROSE 2-4 GM/100ML-% IV SOLN
2.0000 g | INTRAVENOUS | Status: AC
  Administered 2021-05-04: 2 mg via INTRAVENOUS
  Filled 2021-05-03: qty 100

## 2021-05-03 NOTE — Progress Notes (Signed)
ANTICOAGULATION CONSULT NOTE - Follow Up Consult  Pharmacy Consult for heparin Indication: atrial fibrillation  Labs: Recent Labs    05/02/21 0506 05/03/21 0340  HGB 14.2 11.6*  HCT 42.8 34.7*  PLT 190 157  APTT  --  56*  HEPARINUNFRC  --  0.64  CREATININE 0.90 0.68    Assessment: 87yo female subtherapeutic on heparin with initial dosing while Xarelto on hold; no infusion issues or signs of bleeding per RN though drop in Hgb (14.2 > 11.6) noted, has been receiving fluid boluses, unclear whether bleeding at site of fracture.  Goal of Therapy:  Heparin level 0.3-0.7 units/ml   Plan:  Will increase heparin infusion conservatively to 1000 units/hr and check PTT in 8 hours.    Wynona Neat, PharmD, BCPS  05/03/2021,5:10 AM

## 2021-05-03 NOTE — Progress Notes (Signed)
Paged Dr. Hal Hope about pt CT results discussed with MD about pt stating no BM in two days and that pt has been having issues with constipation and taking dulcolax sometimes at home. MD also gave order to go ahead and restart pt's Cardizem drip as well.

## 2021-05-03 NOTE — Progress Notes (Signed)
Bladder scan completed on pt upon arrival to unit due to pt complaining of pelvic pain with pressure. Dr. Hal Hope notified about pt complaining of RLQ pelvic pain 8/10 with pressure. New orders received orders for CT scan of pelvis w/o contrast and to obtain a UA on pt.

## 2021-05-03 NOTE — Plan of Care (Signed)
  Problem: Clinical Measurements: Goal: Ability to maintain clinical measurements within normal limits will improve Outcome: Progressing Goal: Will remain free from infection Outcome: Progressing Goal: Cardiovascular complication will be avoided Outcome: Progressing   Problem: Coping: Goal: Level of anxiety will decrease Outcome: Progressing   Problem: Pain Managment: Goal: General experience of comfort will improve Outcome: Progressing   Problem: Safety: Goal: Ability to remain free from injury will improve Outcome: Progressing   Problem: Skin Integrity: Goal: Risk for impaired skin integrity will decrease Outcome: Progressing

## 2021-05-03 NOTE — Progress Notes (Signed)
PROGRESS NOTE  Tara Bradley  ZDG:[id number] DOB: 1922/10/25 DOA: 05/02/2021 PCP: [name], MD   Brief Narrative: Tara Bradley is a 87 y.o. female with a history of chronic AFib on xarelto, HTN, HLD, and insomnia who presented to the ED 12/10 with severe right shoulder pain after a fall at home at night after taking her first dose restarting temazepam for insomnia. She's had intermittent elevated heart rates at home and was confirmed to have rapid atrial fibrillation in the ED up to 150's bpm. BP was soft, and oxygen saturation was marginal for which 2L O2 was applied. XR confirmed a severely displaced fracture of the right humerus with old acromial fracture. X-ray of the chest, pelvis, CT head, and CT cervical spine showed no acute abnormality. Orthopedics recommended operative management after rate control and xarelto washout period. Diltiazem bolus and infusion as well as heparin infusion were started on admission with cardiology consulting.  Assessment & Plan: Principal Problem:   Right humeral fracture Active Problems:   DM type 2, controlled, with complication (HCC)   Persistent atrial fibrillation with rapid ventricular response (HCC)   Osteoporosis   Chronic anticoagulation   Hyperlipidemia   Chronic insomnia   Chronic congestive heart failure with left ventricular diastolic dysfunction ([place])   Fall at home, initial encounter  Fall at home due to benzodiazepine dose:  - Avoid sedating medications, fall precautions, PT evaluation postoperatively.   Traumatic acute displaced, closed right humerus fracture: Old acromial fracture is not a significant findings per ortho.  - Operative management planned, especially considering her chronic left arm debility from dislocations requiring surgery. Awaiting xarelto washout. - Continue pain control as ordered.  Chronic atrial fibrillation with intermittent RVR:  - Continue digoxin, level is 0.7.  - Continue diltiazem infusion at  5mg /hr. Holding beta blocker at this time. Rate control complicated by wide variations and intermittent soft blood pressure. Appreciate cardiology assistance.  - Heparin bridging given high CHA2DS2-VASc score (6) while holding xarelto. - Check TSH (last checked in 2014)  Chronic HFpEF, HTN: Euvolemic without recent symptoms of decompensation.  - No diuretic currently indicated. - Hold losartan, HCTZ with soft BP. - Continue monitoring I/O, weights.   T2DM: HbA1c 8.6% indicating poor control, even with liberalized goal for age.  - Hold metformin.  - Will change humalog 50/50 mix 5u to levemir 5u daily and continue SSI  Hypokalemia:  - Supplement to maintain goal ~4.   Osteoporosis:  - Check vitamin D  Insomnia:  - Trial trazodone lowest dose qHS prn  DVT prophylaxis: Heparin gtt Code Status: Full Family Communication: None at bedside Disposition Plan:  Status is: Inpatient  Remains inpatient appropriate because: IV therapies pending surgery  Consultants:  Cardiology Orthopedics  Procedures:  TBD  Antimicrobials: None   Subjective: Right shoulder pain is intermittent only with use and moderate-severe at that time. No chest pain or dyspnea or palpitations. No leg swelling. States there is some mild numbness to the tips of her fingers that is stable, no weakness.   Objective: Vitals:   05/03/21 0500 05/03/21 0600 05/03/21 0759 05/03/21 1031  BP: 103/73 123/63 115/84 (!) 106/59  Pulse:   (!) 43 73  Resp:   18 18  Temp:   98.5 F (36.9 C) 99.2 F (37.3 C)  TempSrc:   Oral Oral  SpO2:  95% 97% 98%  Weight:      Height:        Intake/Output Summary (Last 24 hours) at 05/03/2021 1225  Last data filed at 05/03/2021 2694 Gross per 24 hour  Intake 360.08 ml  Output 450 ml  Net -89.92 ml   Filed Weights   05/02/21 0529 05/02/21 2317 05/03/21 0300  Weight: 63.5 kg 66.5 kg 67 kg    Gen: Pleasant, sharp elderly female in no distress Pulm: Non-labored breathing  room air. Clear to auscultation bilaterally.  CV: Rapid irregular. No murmur, rub, or gallop. No JVD, no pitting pedal edema. GI: Abdomen soft, non-tender, non-distended, with normoactive bowel sounds. No organomegaly or masses felt. Ext: Warm, dry. brisk cap refill in RUE with limited ROM, intact motion. Skin: No rashes, lesions or ulcers Neuro: Alert and oriented. No focal neurological deficits. Psych: Judgement and insight appear normal. Mood & affect appropriate.   Data Reviewed: I have personally reviewed following labs and imaging studies  CBC: Recent Labs  Lab 05/02/21 0506 05/03/21 0340  WBC 9.0 6.4  NEUTROABS 6.4  --   HGB 14.2 11.6*  HCT 42.8 34.7*  MCV 95.5 96.7  PLT 190 854   Basic Metabolic Panel: Recent Labs  Lab 05/02/21 0506 05/03/21 0340  NA 136 132*  K 3.2* 4.2  CL 97* 100  CO2 30 23  GLUCOSE 228* 190*  BUN 24* 17  CREATININE 0.90 0.68  CALCIUM 9.5 7.9*  MG 1.9  --    GFR: Estimated Creatinine Clearance: 37.8 mL/min (by C-G formula based on SCr of 0.68 mg/dL). Liver Function Tests: No results for input(s): AST, ALT, ALKPHOS, BILITOT, PROT, ALBUMIN in the last 168 hours. No results for input(s): LIPASE, AMYLASE in the last 168 hours. No results for input(s): AMMONIA in the last 168 hours. Coagulation Profile: No results for input(s): INR, PROTIME in the last 168 hours. Cardiac Enzymes: No results for input(s): CKTOTAL, CKMB, CKMBINDEX, TROPONINI in the last 168 hours. BNP (last 3 results) No results for input(s): PROBNP in the last 8760 hours. HbA1C: Recent Labs    05/03/21 0340  HGBA1C 8.6*   CBG: Recent Labs  Lab 05/02/21 1215 05/02/21 1606 05/02/21 1951 05/03/21 0758 05/03/21 1138  GLUCAP 250* 125* 201* 170* 146*   Lipid Profile: No results for input(s): CHOL, HDL, LDLCALC, TRIG, CHOLHDL, LDLDIRECT in the last 72 hours. Thyroid Function Tests: No results for input(s): TSH, T4TOTAL, FREET4, T3FREE, THYROIDAB in the last 72  hours. Anemia Panel: No results for input(s): VITAMINB12, FOLATE, FERRITIN, TIBC, IRON, RETICCTPCT in the last 72 hours. Urine analysis:    Component Value Date/Time   COLORURINE YELLOW 05/03/2021 0645   APPEARANCEUR CLEAR 05/03/2021 0645   LABSPEC 1.021 05/03/2021 0645   PHURINE 6.0 05/03/2021 0645   GLUCOSEU 150 (A) 05/03/2021 0645   HGBUR NEGATIVE 05/03/2021 0645   BILIRUBINUR NEGATIVE 05/03/2021 0645   BILIRUBINUR n 07/23/2013 1343   KETONESUR 5 (A) 05/03/2021 0645   PROTEINUR NEGATIVE 05/03/2021 0645   UROBILINOGEN 0.2 07/23/2013 1343   UROBILINOGEN 0.2 03/07/2012 2123   NITRITE NEGATIVE 05/03/2021 0645   LEUKOCYTESUR NEGATIVE 05/03/2021 0645   Recent Results (from the past 240 hour(s))  Resp Panel by RT-PCR (Flu A&B, Covid) Nasopharyngeal Swab     Status: None   Collection Time: 05/02/21  8:49 AM   Specimen: Nasopharyngeal Swab; Nasopharyngeal(NP) swabs in vial transport medium  Result Value Ref Range Status   SARS Coronavirus 2 by RT PCR NEGATIVE NEGATIVE Final    Comment: (NOTE) SARS-CoV-2 target nucleic acids are NOT DETECTED.  The SARS-CoV-2 RNA is generally detectable in upper respiratory specimens during the acute phase of infection.  The lowest concentration of SARS-CoV-2 viral copies this assay can detect is 138 copies/mL. A negative result does not preclude SARS-Cov-2 infection and should not be used as the sole basis for treatment or other patient management decisions. A negative result may occur with  improper specimen collection/handling, submission of specimen other than nasopharyngeal swab, presence of viral mutation(s) within the areas targeted by this assay, and inadequate number of viral copies(<138 copies/mL). A negative result must be combined with clinical observations, patient history, and epidemiological information. The expected result is Negative.  Fact Sheet for Patients:  EntrepreneurPulse.com.au  Fact Sheet for Healthcare  Providers:  IncredibleEmployment.be  This test is no t yet approved or cleared by the [place] FDA and  has been authorized for detection and/or diagnosis of SARS-CoV-2 by FDA under an Emergency Use Authorization (EUA). This EUA will remain  in effect (meaning this test can be used) for the duration of the COVID-19 declaration under Section 564(b)(1) of the Act, 21 U.S.C.section 360bbb-3(b)(1), unless the authorization is terminated  or revoked sooner.       Influenza A by PCR NEGATIVE NEGATIVE Final   Influenza B by PCR NEGATIVE NEGATIVE Final    Comment: (NOTE) The Xpert Xpress SARS-CoV-2/FLU/RSV plus assay is intended as an aid in the diagnosis of influenza from Nasopharyngeal swab specimens and should not be used as a sole basis for treatment. Nasal washings and aspirates are unacceptable for Xpert Xpress SARS-CoV-2/FLU/RSV testing.  Fact Sheet for Patients: EntrepreneurPulse.com.au  Fact Sheet for Healthcare Providers: IncredibleEmployment.be  This test is not yet approved or cleared by the [place] FDA and has been authorized for detection and/or diagnosis of SARS-CoV-2 by FDA under an Emergency Use Authorization (EUA). This EUA will remain in effect (meaning this test can be used) for the duration of the COVID-19 declaration under Section 564(b)(1) of the Act, 21 U.S.C. section 360bbb-3(b)(1), unless the authorization is terminated or revoked.  Performed at [facility], [place] [address] [place], [place] [address]       Radiology Studies: CT Head Wo Contrast  Result Date: 05/02/2021 CLINICAL DATA:  87 year old female status post fall on blood thinners. EXAM: CT HEAD WITHOUT CONTRAST TECHNIQUE: Contiguous axial images were obtained from the base of the skull through the vertex without intravenous contrast. COMPARISON:  Head CT 10/19/2019. FINDINGS: Brain: Stable cerebral volume from last year. No  midline shift, ventriculomegaly, mass effect, evidence of mass lesion, intracranial hemorrhage or evidence of cortically based acute infarction. Stable gray-white matter differentiation throughout the brain. Patchy and confluent bilateral cerebral white matter hypodensity with mild heterogeneity of the deep gray nuclei. Vascular: Calcified atherosclerosis at the skull base. No suspicious intracranial vascular hyperdensity. Skull: Stable, intact. Sinuses/Orbits: Visualized paranasal sinuses and mastoids are stable and well aerated. Other: Proximal right humerus fracture evident on CT scout view, see dedicated right humerus series today. No orbit or scalp soft tissue injury identified. IMPRESSION: No acute intracranial abnormality. No acute traumatic injury to the head. Electronically Signed   By: Genevie Ann M.D.   On: 05/02/2021 07:07   CT Cervical Spine Wo Contrast  Result Date: 05/02/2021 CLINICAL DATA:  87 year old female status post fall on blood thinners. EXAM: CT CERVICAL SPINE WITHOUT CONTRAST TECHNIQUE: Multidetector CT imaging of the cervical spine was performed without intravenous contrast. Multiplanar CT image reconstructions were also generated. COMPARISON:  Head CT today.  Cervical spine CT 10/19/2019. FINDINGS: Alignment: Stable. Trace anterolisthesis of C7 on T1. Bilateral posterior element alignment is within normal  limits. Skull base and vertebrae: Visualized skull base is intact. No atlanto-occipital dissociation. Osteopenia. Stable C1-C2 alignment, and those levels appear stable and intact. No acute osseous abnormality identified. Soft tissues and spinal canal: No prevertebral fluid or swelling. No visible canal hematoma. Partially retropharyngeal carotids with bilateral calcified atherosclerosis. Otherwise negative for age visible noncontrast neck soft tissues. Disc levels: Capacious spinal canal despite widespread chronic cervical spine degeneration. Chronic interbody ankylosis C5-C6. Upper  chest: Visible upper thoracic levels appears stable. Negative lung apices. IMPRESSION: 1. No acute traumatic injury identified in the cervical spine. 2. Chronic cervical spine degeneration. Electronically Signed   By: Genevie Ann M.D.   On: 05/02/2021 07:10   CT PELVIS WO CONTRAST  Result Date: 05/03/2021 CLINICAL DATA:  Pelvic fracture. EXAM: CT PELVIS WITHOUT CONTRAST TECHNIQUE: Multidetector CT imaging of the pelvis was performed following the standard protocol without intravenous contrast. COMPARISON:  CT abdomen pelvis dated 09/28/2016. FINDINGS: Evaluation of this exam is limited in the absence of intravenous contrast. Urinary Tract:  No abnormality visualized. Bowel: Large amount of stool throughout the colon. No bowel dilatation or inflammation. The appendix is normal. Vascular/Lymphatic: Advanced aortoiliac atherosclerotic disease. The IVC is unremarkable no adenopathy. Reproductive:  Hysterectomy. Other:  Partially visualized layering stones within the gallbladder. Musculoskeletal: Severe osteopenia. Old healed bilateral pubic bone fractures. Partially visualized right femoral intramedullary nail. The visualized hardware appears intact. No acute osseous pathology. IMPRESSION: 1. No acute osseous pathology. Old healed bilateral pubic bone fractures. 2. Large amount of stool throughout the colon. 3. Cholelithiasis. 4. Aortic Atherosclerosis (ICD10-I70.0). Electronically Signed   By: Anner Crete M.D.   On: 05/03/2021 01:19   DG Pelvis Portable  Result Date: 05/02/2021 CLINICAL DATA:  Fall. EXAM: PORTABLE PELVIS 1-2 VIEWS COMPARISON:  March 15, 2017. FINDINGS: Status post intramedullary rod fixation of right femur. Old fractures are seen involving bilateral superior and inferior pubic rami. No definite acute fracture or dislocation is noted. IMPRESSION: Old postsurgical and posttraumatic changes as described above. No acute abnormality is noted. Electronically Signed   By: Marijo Conception M.D.    On: 05/02/2021 06:48   DG Chest Portable 1 View  Result Date: 05/02/2021 CLINICAL DATA:  Fall. EXAM: PORTABLE CHEST 1 VIEW COMPARISON:  March 10, 2017. FINDINGS: Stable cardiomegaly. Old left rib fractures are noted. Lungs are clear. IMPRESSION: No acute cardiopulmonary abnormality seen. Aortic Atherosclerosis (ICD10-I70.0). Electronically Signed   By: Marijo Conception M.D.   On: 05/02/2021 06:43   DG Humerus Right  Result Date: 05/02/2021 CLINICAL DATA:  Fall. EXAM: RIGHT HUMERUS - 2+ VIEW COMPARISON:  March 10, 2017. FINDINGS: Severely displaced fracture is seen involving the proximal right humeral neck of indeterminate age. Old displaced acromial fracture is noted as well. IMPRESSION: Severely displaced proximal right humeral neck fracture of indeterminate age. Old displaced acromial fracture is noted as well. Electronically Signed   By: Marijo Conception M.D.   On: 05/02/2021 06:46    Scheduled Meds:  digoxin  0.0625 mg Oral Daily   insulin aspart  0-6 Units Subcutaneous TID WC   insulin aspart protamine- aspart  5 Units Subcutaneous Q breakfast   sodium chloride flush  3 mL Intravenous Q12H   Continuous Infusions:  diltiazem (CARDIZEM) infusion 5 mg/hr (05/03/21 0208)   heparin 1,000 Units/hr (05/03/21 0606)     LOS: 1 day   Time spent: 25 minutes.  Patrecia Pour, MD [facility] [email or web address] 05/03/2021, 12:25 PM

## 2021-05-03 NOTE — Evaluation (Signed)
Occupational Therapy Evaluation Patient Details Name: Tara Bradley MRN: [id number] DOB: Feb 26, 1923 Today's Date: 05/03/2021   History of Present Illness This 87 y.o. female admitted 05/02/21 after sustaining a fall.  She does endorse hitting her head, and was found to have severely displaced proximal right humeral neck fracture of indeterminate age along with old displaced acromial fracture. Planning for ORIF.  She was also found to have A-Fib with RVR with rates in 150s and hypoxia with ambulation requiring 2L 02. PMH includes: arthritis, aortic valve stenosis, bradycardia, DM, HTN, A-Fib, syncope, ho ulnar fx   Clinical Impression   Pt admitted for concerns listed above. PTA pt reported that she was independent with all ADL's and functional mobility using rollator. At this time, pt is limited due to R shoulder immobilization and pain. Pt requiring max A for all ADL's due to pain, weakness, incoordination, and balance concerns. Once pt has surgery and her fracture is stabilized, pt is expected to have increased independence and will only require HHOT services, however if pt continues to require max assist s/p surgery, pt may need SNF level therapies to maximize her independence prior to returning home. OT will continue to follow acutely.      Recommendations for follow up therapy are one component of a multi-disciplinary discharge planning process, led by the attending physician.  Recommendations may be updated based on patient status, additional functional criteria and insurance authorization.   Follow Up Recommendations  Home health OT    Assistance Recommended at Discharge Frequent or constant Supervision/Assistance  Functional Status Assessment  Patient has had a recent decline in their functional status and demonstrates the ability to make significant improvements in function in a reasonable and predictable amount of time.  Equipment Recommendations  None recommended by OT     Recommendations for Other Services       Precautions / Restrictions Precautions Precautions: Fall Precaution Comments: displaced R humerus fx; monitor HR Required Braces or Orthoses: Sling Restrictions Weight Bearing Restrictions: Yes RUE Weight Bearing: Non weight bearing      Mobility Bed Mobility Overal bed mobility: Needs Assistance Bed Mobility: Supine to Sit     Supine to sit: Min assist;+2 for safety/equipment;HOB elevated     General bed mobility comments: Pt requesting support at R shoulder/upper arm with bed mobility, even though already in sling. MinA also to scoot hips to EOB.    Transfers Overall transfer level: Needs assistance Equipment used: 2 person hand held assist Transfers: Sit to/from Stand;Bed to chair/wheelchair/BSC Sit to Stand: Min assist;+2 safety/equipment     Step pivot transfers: Min assist;+2 safety/equipment     General transfer comment: L HHA and support under R elbow with sling donned, cuing pt to scoot to EOB for improved ease with transfer to stand, minA to power up and steady with transfer to stand then stand step to L bed > recliner.      Balance Overall balance assessment: Needs assistance Sitting-balance support: Single extremity supported;No upper extremity supported;Feet supported Sitting balance-Leahy Scale: Fair Sitting balance - Comments: Able to sit statically EOB without UE support, but tends to place L hand on bed.   Standing balance support: Single extremity supported;Bilateral upper extremity supported;During functional activity Standing balance-Leahy Scale: Poor Standing balance comment: Reliant on at least L UE support (support under R elbow for comfort).                           ADL either  performed or assessed with clinical judgement   ADL Overall ADL's : Needs assistance/impaired                                       General ADL Comments: Max A for all ADL's and functional  mobility at this time. Pt reports that she can't use her LUE to assit with anything due to poor coordination.     Vision Baseline Vision/History: 1 Wears glasses Ability to See in Adequate Light: 1 Impaired Patient Visual Report: No change from baseline Vision Assessment?: No apparent visual deficits     Perception     Praxis      Pertinent Vitals/Pain Pain Location: R UE with mobility Pain Descriptors / Indicators: Discomfort;Grimacing;Guarding     Hand Dominance     Extremity/Trunk Assessment Upper Extremity Assessment Upper Extremity Assessment: RUE deficits/detail RUE Deficits / Details: humerus fracture RUE: Shoulder pain with ROM;Shoulder pain at rest RUE Sensation: decreased light touch RUE Coordination: decreased fine motor;decreased gross motor   Lower Extremity Assessment Lower Extremity Assessment: Defer to PT evaluation   Cervical / Trunk Assessment Cervical / Trunk Assessment: Normal   Communication Communication Communication: No difficulties   Cognition Arousal/Alertness: Awake/alert Behavior During Therapy: WFL for tasks assessed/performed Overall Cognitive Status: Within Functional Limits for tasks assessed                                       General Comments  O2 95%, HR up to 141 with transfer, deferring further gait.    Exercises     Shoulder Instructions      Home Living Family/patient expects to be discharged to:: Private residence Living Arrangements: Alone Available Help at Discharge:  (none per pt) Type of Home: Apartment Home Access: Elevator     Home Layout: One level     Bathroom Shower/Tub: Occupational psychologist: Handicapped height     Home Equipment: Shower seat;Toilet riser;BSC/3in1;Rollator (4 wheels);Rolling Walker (2 wheels);Cane - single point;Other (comment);Wheelchair - manual (hurrycane)          Prior Functioning/Environment Prior Level of Function : Driving;Independent/Modified  Independent             Mobility Comments: Uses rollator. No other falls in past 6 months per pt. ADLs Comments: Cleans, cooks, and performs all ADLs without assistance.        OT Problem List: Decreased strength;Decreased range of motion;Decreased activity tolerance;Impaired balance (sitting and/or standing);Decreased coordination;Decreased safety awareness;Impaired sensation;Impaired UE functional use;Pain      OT Treatment/Interventions: Self-care/ADL training;Therapeutic exercise;Energy conservation;DME and/or AE instruction;Therapeutic activities;Patient/family education;Balance training    OT Goals(Current goals can be found in the care plan section) Acute Rehab OT Goals Patient Stated Goal: To lessen pain OT Goal Formulation: With patient Time For Goal Achievement: 05/17/21 Potential to Achieve Goals: Good ADL Goals Pt Will Perform Grooming: with modified independence;standing Pt Will Perform Upper Body Bathing: sitting;with modified independence Pt Will Perform Lower Body Bathing: with modified independence;sitting/lateral leans;sit to/from stand Pt Will Perform Upper Body Dressing: with modified independence;sitting Pt Will Perform Lower Body Dressing: with modified independence;sitting/lateral leans Pt Will Transfer to Toilet: with modified independence;ambulating Pt Will Perform Toileting - Clothing Manipulation and hygiene: with modified independence;sitting/lateral leans  OT Frequency: Min 2X/week   Barriers to D/C:    Pt reports  no support at home       Co-evaluation PT/OT/SLP Co-Evaluation/Treatment: Yes Reason for Co-Treatment: To address functional/ADL transfers;Other (comment) (high pain with mobility)   OT goals addressed during session: ADL's and self-care;Strengthening/ROM      AM-PAC OT "6 Clicks" Daily Activity     Outcome Measure Help from another person eating meals?: A Lot Help from another person taking care of personal grooming?: A Lot Help  from another person toileting, which includes using toliet, bedpan, or urinal?: A Lot Help from another person bathing (including washing, rinsing, drying)?: A Lot Help from another person to put on and taking off regular upper body clothing?: A Lot Help from another person to put on and taking off regular lower body clothing?: A Lot 6 Click Score: 12   End of Session Equipment Utilized During Treatment: Gait belt Nurse Communication: Mobility status  Activity Tolerance: Patient limited by pain Patient left: in chair;with call bell/phone within reach;with chair alarm set  OT Visit Diagnosis: Unsteadiness on feet (R26.81);Other abnormalities of gait and mobility (R26.89);Muscle weakness (generalized) (M62.81);Pain Pain - Right/Left: Right Pain - part of body: Arm                Time: 8115-7262 OT Time Calculation (min): 18 min Charges:  OT General Charges $OT Visit: 1 Visit OT Evaluation $OT Eval Moderate Complexity: 1 Mod  Winferd Wease H., OTR/L Acute Rehabilitation  [facility] [place] Elane Yolanda Bonine 05/03/2021, 5:57 PM

## 2021-05-03 NOTE — Progress Notes (Signed)
ANTICOAGULATION CONSULT NOTE - Follow-up Consult  Pharmacy Consult for heparin Indication: atrial fibrillation  Allergies  Allergen Reactions   Ambien [Zolpidem Tartrate]     Hallucinations    Codeine Other (See Comments)    headache   Codeine Sulfate     REACTION: unspecified   Latex Hives   Remeron [Mirtazapine]     Patient Measurements: Height: 5\' 3"  (160 cm) Weight: 67 kg (147 lb 11.3 oz) IBW/kg (Calculated) : 52.4 Heparin Dosing Weight: 63.5 kg  Vital Signs: Temp: 99.2 F (37.3 C) (12/11 1031) Temp Source: Oral (12/11 1031) BP: 119/86 (12/11 1325) Pulse Rate: 73 (12/11 1031)  Labs: Recent Labs    05/02/21 0506 05/03/21 0340 05/03/21 1223  HGB 14.2 11.6*  --   HCT 42.8 34.7*  --   PLT 190 157  --   APTT  --  56* 70*  HEPARINUNFRC  --  0.64  --   CREATININE 0.90 0.68  --      Estimated Creatinine Clearance: 37.8 mL/min (by C-G formula based on SCr of 0.68 mg/dL).   Medical History: Past Medical History:  Diagnosis Date   Aortic valve sclerosis    Echo, 2008   Arthritis    Bradycardia    October, 2012   Cancer [facility])    Chronic insomnia    Closed fracture of unspecified part of femur 2005   Diabetes mellitus, type 2 ([place])    Diverticulitis    Diverticulosis    Ejection fraction    EF 60%, echo, February, 2008  //   EF 65-70%, echo, November, 2012   Hyperlipidemia    Hypertension    Long term (current) use of anticoagulants    Osteopenia    Osteoporosis    femur fracture 2005, pelvic fracture 2006   Persistent atrial fibrillation (HCC)    Personal history of malignant neoplasm of breast    Syncope    Thoracic aortic aneurysm 02/2017   Unspecified closed fracture of pelvis 2006    Medications:  Medications Prior to Admission  Medication Sig Dispense Refill Last Dose   acetaminophen (TYLENOL) 325 MG tablet Take 2 tablets (650 mg total) by mouth every 6 (six) hours. (Patient taking differently: Take 650 mg by mouth every 6 (six) hours as  needed for mild pain or headache.)   Past Month   BD PEN NEEDLE NANO U/F 32G X 4 MM MISC Inject into the skin daily.      Calcium Carbonate-Vitamin D 600-400 MG-UNIT tablet Take 1 tablet by mouth daily.   05/01/2021   Cholecalciferol (VITAMIN D) 2000 UNITS tablet Take 1,000 Units by mouth daily.   05/01/2021   digoxin (LANOXIN) 0.125 MG tablet TAKE (1/2) TABLET DAILY. (Patient taking differently: Take 0.0625 mg by mouth daily.) 90 tablet 3 05/01/2021   diltiazem (CARDIZEM CD) 300 MG 24 hr capsule TAKE ONE CAPSULE BY MOUTH EVERY DAY (Patient taking differently: Take 300 mg by mouth daily.) 90 capsule 1 05/01/2021   furosemide (LASIX) 40 MG tablet Take 40 mg by mouth daily.    05/01/2021   HUMALOG MIX 50/50 KWIKPEN (50-50) 100 UNIT/ML KwikPen Inject 5 Units into the skin daily.   05/01/2021   losartan-hydrochlorothiazide (HYZAAR) 100-12.5 MG tablet Take 1 tablet by mouth every morning.   05/01/2021   metFORMIN (GLUMETZA) 500 MG (MOD) 24 hr tablet Take 1,000 mg by mouth every evening.   05/01/2021   metoprolol tartrate (LOPRESSOR) 25 MG tablet TAKE 1 TABLET BY MOUTH TWICE DAILY. (Patient taking differently:  Take 25 mg by mouth 2 (two) times daily.) 180 tablet 4 05/01/2021 at 2000   potassium chloride (K-DUR) 10 MEQ tablet Take 10 mEq by mouth daily.    05/01/2021   temazepam (RESTORIL) 15 MG capsule Take 15 mg by mouth at bedtime.   05/01/2021   XARELTO 15 MG TABS tablet TAKE 1 TABLET ONCE DAILY WITH SUPPER. (Patient taking differently: 15 mg daily with supper.) 90 tablet 1 05/01/2021 at 1900    Assessment: 76 YOF who presents with non syncopal fall noted to have Afib with RVR. On Xarelto PTA with last dose taken on 12/9 at 7 PM. Pharmacy consulted to transition to IV heparin.   APPT 70 seconds today on re-check and therapeutic. Hgb decreased to 11.6 from 14.2, no overt bleeding noted, but patient with fracture. Platelets are stable. No signs of bleeding or IV site issues per RN.  Goal of Therapy:  Heparin  level 0.3-0.7 units/ml aPTT 66-102 seconds Monitor platelets by anticoagulation protocol: Yes   Plan:  Continue IV heparin gtt 1000 units/h Daily Heparin level, aPTT until both correlate Daily CBC Monitor for s/sx of bleeding  Thank you for involving pharmacy in this patient's care.  Elita Quick, PharmD PGY1 Ambulatory Care Pharmacy Resident 05/03/2021 2:04 PM  **Pharmacist phone directory can be found on [email or web address] listed under [place]**

## 2021-05-03 NOTE — Progress Notes (Signed)
Progress Note  Patient Name: Tara Bradley Date of Encounter: 05/03/2021  Primary Cardiologist:   [name], MD   Subjective   She is breathing OK.  Should doesn't hurt if she is not moving it.   Inpatient Medications    Scheduled Meds:  digoxin  0.0625 mg Oral Daily   insulin aspart  0-6 Units Subcutaneous TID WC   insulin aspart protamine- aspart  5 Units Subcutaneous Q breakfast   sodium chloride flush  3 mL Intravenous Q12H   Continuous Infusions:  diltiazem (CARDIZEM) infusion 5 mg/hr (05/03/21 0208)   heparin 1,000 Units/hr (05/03/21 0606)   PRN Meds: acetaminophen **OR** acetaminophen, albuterol, HYDROcodone-acetaminophen, morphine injection, ondansetron **OR** ondansetron (ZOFRAN) IV, temazepam   Vital Signs    Vitals:   05/03/21 0500 05/03/21 0600 05/03/21 0759 05/03/21 1031  BP: 103/73 123/63 115/84 (!) 106/59  Pulse:   (!) 43 73  Resp:   18 18  Temp:   98.5 F (36.9 C) 99.2 F (37.3 C)  TempSrc:   Oral Oral  SpO2:  95% 97% 98%  Weight:      Height:        Intake/Output Summary (Last 24 hours) at 05/03/2021 1115 Last data filed at 05/03/2021 1191 Gross per 24 hour  Intake 610.08 ml  Output 450 ml  Net 160.08 ml   Filed Weights   05/02/21 0529 05/02/21 2317 05/03/21 0300  Weight: 63.5 kg 66.5 kg 67 kg    Telemetry    Atrial fib with controlled ventricular rate - Personally Reviewed  ECG    NA - Personally Reviewed  Physical Exam   GEN: No acute distress.   Neck: No  JVD Cardiac: Irregular RR, no murmurs, rubs, or gallops.  Respiratory: Clear  to auscultation bilaterally. GI: Soft, nontender, non-distended  MS: No  edema; No deformity. Neuro:  Nonfocal  Psych: Normal affect   Labs    Chemistry Recent Labs  Lab 05/02/21 0506 05/03/21 0340  NA 136 132*  K 3.2* 4.2  CL 97* 100  CO2 30 23  GLUCOSE 228* 190*  BUN 24* 17  CREATININE 0.90 0.68  CALCIUM 9.5 7.9*  GFRNONAA 59* >60  ANIONGAP 9 9      Hematology Recent Labs  Lab 05/02/21 0506 05/03/21 0340  WBC 9.0 6.4  RBC 4.48 3.59*  HGB 14.2 11.6*  HCT 42.8 34.7*  MCV 95.5 96.7  MCH 31.7 32.3  MCHC 33.2 33.4  RDW 13.2 13.6  PLT 190 157    Cardiac EnzymesNo results for input(s): TROPONINI in the last 168 hours. No results for input(s): TROPIPOC in the last 168 hours.   BNPNo results for input(s): BNP, PROBNP in the last 168 hours.   DDimer No results for input(s): DDIMER in the last 168 hours.   Radiology    CT Head Wo Contrast  Result Date: 05/02/2021 CLINICAL DATA:  87 year old female status post fall on blood thinners. EXAM: CT HEAD WITHOUT CONTRAST TECHNIQUE: Contiguous axial images were obtained from the base of the skull through the vertex without intravenous contrast. COMPARISON:  Head CT 10/19/2019. FINDINGS: Brain: Stable cerebral volume from last year. No midline shift, ventriculomegaly, mass effect, evidence of mass lesion, intracranial hemorrhage or evidence of cortically based acute infarction. Stable gray-white matter differentiation throughout the brain. Patchy and confluent bilateral cerebral white matter hypodensity with mild heterogeneity of the deep gray nuclei. Vascular: Calcified atherosclerosis at the skull base. No suspicious intracranial vascular hyperdensity. Skull: Stable, intact. Sinuses/Orbits: Visualized paranasal  sinuses and mastoids are stable and well aerated. Other: Proximal right humerus fracture evident on CT scout view, see dedicated right humerus series today. No orbit or scalp soft tissue injury identified. IMPRESSION: No acute intracranial abnormality. No acute traumatic injury to the head. Electronically Signed   By: Genevie Ann M.D.   On: 05/02/2021 07:07   CT Cervical Spine Wo Contrast  Result Date: 05/02/2021 CLINICAL DATA:  87 year old female status post fall on blood thinners. EXAM: CT CERVICAL SPINE WITHOUT CONTRAST TECHNIQUE: Multidetector CT imaging of the cervical spine was  performed without intravenous contrast. Multiplanar CT image reconstructions were also generated. COMPARISON:  Head CT today.  Cervical spine CT 10/19/2019. FINDINGS: Alignment: Stable. Trace anterolisthesis of C7 on T1. Bilateral posterior element alignment is within normal limits. Skull base and vertebrae: Visualized skull base is intact. No atlanto-occipital dissociation. Osteopenia. Stable C1-C2 alignment, and those levels appear stable and intact. No acute osseous abnormality identified. Soft tissues and spinal canal: No prevertebral fluid or swelling. No visible canal hematoma. Partially retropharyngeal carotids with bilateral calcified atherosclerosis. Otherwise negative for age visible noncontrast neck soft tissues. Disc levels: Capacious spinal canal despite widespread chronic cervical spine degeneration. Chronic interbody ankylosis C5-C6. Upper chest: Visible upper thoracic levels appears stable. Negative lung apices. IMPRESSION: 1. No acute traumatic injury identified in the cervical spine. 2. Chronic cervical spine degeneration. Electronically Signed   By: Genevie Ann M.D.   On: 05/02/2021 07:10   CT PELVIS WO CONTRAST  Result Date: 05/03/2021 CLINICAL DATA:  Pelvic fracture. EXAM: CT PELVIS WITHOUT CONTRAST TECHNIQUE: Multidetector CT imaging of the pelvis was performed following the standard protocol without intravenous contrast. COMPARISON:  CT abdomen pelvis dated 09/28/2016. FINDINGS: Evaluation of this exam is limited in the absence of intravenous contrast. Urinary Tract:  No abnormality visualized. Bowel: Large amount of stool throughout the colon. No bowel dilatation or inflammation. The appendix is normal. Vascular/Lymphatic: Advanced aortoiliac atherosclerotic disease. The IVC is unremarkable no adenopathy. Reproductive:  Hysterectomy. Other:  Partially visualized layering stones within the gallbladder. Musculoskeletal: Severe osteopenia. Old healed bilateral pubic bone fractures. Partially  visualized right femoral intramedullary nail. The visualized hardware appears intact. No acute osseous pathology. IMPRESSION: 1. No acute osseous pathology. Old healed bilateral pubic bone fractures. 2. Large amount of stool throughout the colon. 3. Cholelithiasis. 4. Aortic Atherosclerosis (ICD10-I70.0). Electronically Signed   By: Anner Crete M.D.   On: 05/03/2021 01:19   DG Pelvis Portable  Result Date: 05/02/2021 CLINICAL DATA:  Fall. EXAM: PORTABLE PELVIS 1-2 VIEWS COMPARISON:  March 15, 2017. FINDINGS: Status post intramedullary rod fixation of right femur. Old fractures are seen involving bilateral superior and inferior pubic rami. No definite acute fracture or dislocation is noted. IMPRESSION: Old postsurgical and posttraumatic changes as described above. No acute abnormality is noted. Electronically Signed   By: Marijo Conception M.D.   On: 05/02/2021 06:48   DG Chest Portable 1 View  Result Date: 05/02/2021 CLINICAL DATA:  Fall. EXAM: PORTABLE CHEST 1 VIEW COMPARISON:  March 10, 2017. FINDINGS: Stable cardiomegaly. Old left rib fractures are noted. Lungs are clear. IMPRESSION: No acute cardiopulmonary abnormality seen. Aortic Atherosclerosis (ICD10-I70.0). Electronically Signed   By: Marijo Conception M.D.   On: 05/02/2021 06:43   DG Humerus Right  Result Date: 05/02/2021 CLINICAL DATA:  Fall. EXAM: RIGHT HUMERUS - 2+ VIEW COMPARISON:  March 10, 2017. FINDINGS: Severely displaced fracture is seen involving the proximal right humeral neck of indeterminate age. Old displaced acromial fracture is  noted as well. IMPRESSION: Severely displaced proximal right humeral neck fracture of indeterminate age. Old displaced acromial fracture is noted as well. Electronically Signed   By: Marijo Conception M.D.   On: 05/02/2021 06:46    Cardiac Studies   None   Patient Profile     87 y.o. female with a hx of with a history of an aortic thrombosed descending aortic dissection, aortic valve  sclerosis and chronic afib on Xarelto.  who is being seen today for the evaluation of atrial fib  at the request of Dr. Tamala Julian.    Assessment & Plan    ATRIAL FIB:     Her rate has been relatively well controlled despite the pain.  Continue IV Cardizem through surgery.  Heparin ordered.  Xarelto on hold.  Holding beta blocker currently with borderline BP.  Will be resuming this and PO Cardizem post op.   Hold Hyzaar for now.     AORTIC DISSECTION:   Chronic and not an issue.     CHRONIC SYSTOLIC AND DIASTOLIC DYSFUNCTION:   She seems to be euvolemic.  She has had some trouble urinating but has emptied her bladder adequately if allowed to sit up to urinate.    PREOP:  The patient is at acceptable risk for the necessary shoulder surgery.  The most significant risk is her advanced age.  We can follow her rate which will be labile and we will need to follow her volume status closely.    For questions or updates, please contact [place] Please consult [email or web address] for contact info under Cardiology/STEMI.   Signed, [name], MD  05/03/2021, 11:15 AM

## 2021-05-03 NOTE — Evaluation (Signed)
Physical Therapy Evaluation Patient Details Name: Tara Bradley MRN: [id number] DOB: 12-Mar-1923 Today's Date: 05/03/2021  History of Present Illness  This 87 y.o. female admitted 05/02/21 after sustaining a fall.  She does endorse hitting her head, and was found to have severely displaced proximal right humeral neck fracture of indeterminate age along with old displaced acromial fracture. Planning for ORIF.  She was also found to have A-Fib with RVR with rates in 150s and hypoxia with ambulation requiring 2L 02. PMH includes: arthritis, aortic valve stenosis, bradycardia, DM, HTN, A-Fib, syncope, ho ulnar fx   Clinical Impression  Pt presents with condition above and deficits mentioned below, see PT Problem List. PTA, she was mod I using a rollator for mobility, living alone in an apartment with an elevator access. Currently, pt is primarily limited by her R arm pain and inability to use it to assist her. She is requiring light minA for all functional mobility due to this. Pt limited to only taking a few steps bed > recliner and no further gait progression this date by elevated HR. Her HR went up to 141 with standing mobility today. She also displays some deficits in lower extremity strength, balance, and activity tolerance. Pt with no family or friend support/assistance available at discharge, thus if she is unable to manage taking care of herself with either just the L UE or whatever the restrictions are on the R following ORIF she may need to get short-term rehab at a SNF. I am hopeful that she will be able to progress well and manage caring for herself though following the ORIF surgery, recommending HHPT at this time. Will continue to follow acutely and re-assess following the ORIF surgery.     Recommendations for follow up therapy are one component of a multi-disciplinary discharge planning process, led by the attending physician.  Recommendations may be updated based on patient status, additional  functional criteria and insurance authorization.  Follow Up Recommendations Home health PT (pending WB status following ORIF, if unable to care for self may need SNF)    Assistance Recommended at Discharge Intermittent Supervision/Assistance  Functional Status Assessment Patient has had a recent decline in their functional status and demonstrates the ability to make significant improvements in function in a reasonable and predictable amount of time.  Equipment Recommendations  None recommended by PT    Recommendations for Other Services       Precautions / Restrictions Precautions Precautions: Fall Precaution Comments: displaced R humerus fx; monitor HR Required Braces or Orthoses: Sling Restrictions Weight Bearing Restrictions: Yes RUE Weight Bearing: Non weight bearing      Mobility  Bed Mobility Overal bed mobility: Needs Assistance Bed Mobility: Supine to Sit     Supine to sit: Min assist;+2 for safety/equipment;HOB elevated     General bed mobility comments: Pt requesting support at R shoulder/upper arm with bed mobility, even though already in sling. MinA also to scoot hips to EOB.    Transfers Overall transfer level: Needs assistance Equipment used: 2 person hand held assist Transfers: Sit to/from Stand;Bed to chair/wheelchair/BSC Sit to Stand: Min assist;+2 safety/equipment   Step pivot transfers: Min assist;+2 safety/equipment       General transfer comment: L HHA and support under R elbow with sling donned, cuing pt to scoot to EOB for improved ease with transfer to stand, minA to power up and steady with transfer to stand then stand step to L bed > recliner.    Ambulation/Gait Ambulation/Gait assistance: Min assist;+2  safety/equipment Gait Distance (Feet): 4 Feet Assistive device: 2 person hand held assist Gait Pattern/deviations: Step-through pattern;Decreased stride length;Shuffle Gait velocity: reduced Gait velocity interpretation: <1.31 ft/sec,  indicative of household ambulator   General Gait Details: L HHA and support under R elbow with sling donned, minA for stability. Pt with slow, short, shufflign steps to walk bed > recliner, no LOB. HR up to 141 thus deferred progressing gait  Stairs            Wheelchair Mobility    Modified Rankin (Stroke Patients Only)       Balance Overall balance assessment: Needs assistance Sitting-balance support: Single extremity supported;No upper extremity supported;Feet supported Sitting balance-Leahy Scale: Fair Sitting balance - Comments: Able to sit statically EOB without UE support, but tends to place L hand on bed.   Standing balance support: Single extremity supported;Bilateral upper extremity supported;During functional activity Standing balance-Leahy Scale: Poor Standing balance comment: Reliant on at least L UE support (support under R elbow for comfort).                             Pertinent Vitals/Pain Pain Assessment: 0-10 Pain Score: 10-Worst pain ever Pain Location: R UE with mobility Pain Descriptors / Indicators: Discomfort;Grimacing;Guarding Pain Intervention(s): Limited activity within patient's tolerance;Monitored during session;Repositioned    Home Living Family/patient expects to be discharged to:: Private residence Living Arrangements: Alone Available Help at Discharge:  (none per pt) Type of Home: Apartment Home Access: Elevator       Home Layout: One level Home Equipment: Shower seat;Toilet riser;BSC/3in1;Rollator (4 wheels);Rolling Walker (2 wheels);Cane - single point;Other (comment);Wheelchair - manual (hurrycane)      Prior Function Prior Level of Function : Driving;Independent/Modified Independent             Mobility Comments: Uses rollator. No other falls in past 6 months per pt. ADLs Comments: Cleans, cooks, and performs all ADLs without assistance.     Hand Dominance        Extremity/Trunk Assessment   Upper  Extremity Assessment Upper Extremity Assessment: Defer to OT evaluation    Lower Extremity Assessment Lower Extremity Assessment: Generalized weakness (MMT scores of 4- with bil hip flexion, 4+ R knee extension, 5 L knee extension, 5 bil ankle dorsiflexion; denied numbness/tingling)    Cervical / Trunk Assessment Cervical / Trunk Assessment: Normal  Communication   Communication: No difficulties  Cognition Arousal/Alertness: Awake/alert Behavior During Therapy: WFL for tasks assessed/performed Overall Cognitive Status: Within Functional Limits for tasks assessed                                          General Comments General comments (skin integrity, edema, etc.): SpO2 WNL on RA, HR up to 141 with standing transfer to chair, thus deferred further gait    Exercises     Assessment/Plan    PT Assessment Patient needs continued PT services  PT Problem List Decreased strength;Decreased activity tolerance;Decreased range of motion;Decreased balance;Decreased mobility;Cardiopulmonary status limiting activity;Pain       PT Treatment Interventions DME instruction;Gait training;Functional mobility training;Therapeutic activities;Therapeutic exercise;Balance training;Neuromuscular re-education;Patient/family education    PT Goals (Current goals can be found in the Care Plan section)  Acute Rehab PT Goals Patient Stated Goal: to go to [place] with her family PT Goal Formulation: With patient Time For Goal Achievement: 05/17/21 Potential to Achieve  Goals: Good    Frequency Min 3X/week (likely 5x/week after ORIF)   Barriers to discharge        Co-evaluation PT/OT/SLP Co-Evaluation/Treatment: Yes Reason for Co-Treatment: To address functional/ADL transfers;Other (comment) (high pain with mobility) PT goals addressed during session: Mobility/safety with mobility;Balance         AM-PAC PT "6 Clicks" Mobility  Outcome Measure Help needed turning from your back  to your side while in a flat bed without using bedrails?: A Little Help needed moving from lying on your back to sitting on the side of a flat bed without using bedrails?: A Little Help needed moving to and from a bed to a chair (including a wheelchair)?: A Little Help needed standing up from a chair using your arms (e.g., wheelchair or bedside chair)?: A Little Help needed to walk in hospital room?: A Little (assumed if HR did not limit her today) Help needed climbing 3-5 steps with a railing? : A Lot 6 Click Score: 17    End of Session Equipment Utilized During Treatment: Other (comment) (sling) Activity Tolerance: Patient tolerated treatment well;Patient limited by pain;Other (comment) (limited by elevated HR) Patient left: in chair;with call bell/phone within reach;with chair alarm set Nurse Communication: Mobility status;Other (comment) (HR, doffed [place] as SpO2 stable on RA) PT Visit Diagnosis: Unsteadiness on feet (R26.81);Other abnormalities of gait and mobility (R26.89);Muscle weakness (generalized) (M62.81);History of falling (Z91.81);Difficulty in walking, not elsewhere classified (R26.2);Pain Pain - Right/Left: Right Pain - part of body: Arm    Time: 6773-7366 PT Time Calculation (min) (ACUTE ONLY): 18 min   Charges:   PT Evaluation $PT Eval Moderate Complexity: 1 Mod          Moishe Spice, PT, DPT Acute Rehabilitation Services  Pager: [phone] Office: [phone]   Orvan Falconer 05/03/2021, 12:26 PM

## 2021-05-04 ENCOUNTER — Inpatient Hospital Stay (HOSPITAL_COMMUNITY): Payer: Medicare Other | Admitting: Certified Registered Nurse Anesthetist

## 2021-05-04 ENCOUNTER — Inpatient Hospital Stay (HOSPITAL_COMMUNITY): Payer: Medicare Other

## 2021-05-04 ENCOUNTER — Ambulatory Visit: Payer: Medicare Other | Admitting: Podiatry

## 2021-05-04 ENCOUNTER — Encounter (HOSPITAL_COMMUNITY)
Admission: EM | Disposition: A | Discharge status: Skilled Nursing Facility | Payer: Self-pay | Source: Home / Self Care | Attending: Internal Medicine

## 2021-05-04 ENCOUNTER — Encounter (HOSPITAL_COMMUNITY): Payer: Self-pay | Admitting: Internal Medicine

## 2021-05-04 DIAGNOSIS — I5032 Chronic diastolic (congestive) heart failure: Secondary | ICD-10-CM

## 2021-05-04 DIAGNOSIS — Z96611 Presence of right artificial shoulder joint: Secondary | ICD-10-CM

## 2021-05-04 DIAGNOSIS — I4891 Unspecified atrial fibrillation: Secondary | ICD-10-CM

## 2021-05-04 HISTORY — PX: REVERSE SHOULDER ARTHROPLASTY: SHX5054

## 2021-05-04 LAB — CBC
HCT: 33.3 % — ABNORMAL LOW (ref 36.0–46.0)
Hemoglobin: 11.2 g/dL — ABNORMAL LOW (ref 12.0–15.0)
MCH: 32.6 pg (ref 26.0–34.0)
MCHC: 33.6 g/dL (ref 30.0–36.0)
MCV: 96.8 fL (ref 80.0–100.0)
Platelets: 153 10*3/uL (ref 150–400)
RBC: 3.44 MIL/uL — ABNORMAL LOW (ref 3.87–5.11)
RDW: 13.9 % (ref 11.5–15.5)
WBC: 6.6 10*3/uL (ref 4.0–10.5)
nRBC: 0 % (ref 0.0–0.2)

## 2021-05-04 LAB — BASIC METABOLIC PANEL
Anion gap: 10 (ref 5–15)
BUN: 17 mg/dL (ref 8–23)
CO2: 23 mmol/L (ref 22–32)
Calcium: 8.2 mg/dL — ABNORMAL LOW (ref 8.9–10.3)
Chloride: 100 mmol/L (ref 98–111)
Creatinine, Ser: 0.7 mg/dL (ref 0.44–1.00)
GFR, Estimated: >60 mL/min (ref >60)
Glucose, Bld: 154 mg/dL — ABNORMAL HIGH (ref 70–99)
Potassium: 4.1 mmol/L (ref 3.5–5.1)
Sodium: 133 mmol/L — ABNORMAL LOW (ref 135–145)

## 2021-05-04 LAB — HEPARIN LEVEL (UNFRACTIONATED): Heparin Unfractionated: 0.49 IU/mL (ref 0.30–0.70)

## 2021-05-04 LAB — SURGICAL PCR SCREEN
MRSA, PCR: NEGATIVE
Staphylococcus aureus: NEGATIVE

## 2021-05-04 LAB — GLUCOSE, CAPILLARY
Glucose-Capillary: 173 mg/dL — ABNORMAL HIGH (ref 70–99)
Glucose-Capillary: 160 mg/dL — ABNORMAL HIGH (ref 70–99)
Glucose-Capillary: 108 mg/dL — ABNORMAL HIGH (ref 70–99)
Glucose-Capillary: 111 mg/dL — ABNORMAL HIGH (ref 70–99)
Glucose-Capillary: 110 mg/dL — ABNORMAL HIGH (ref 70–99)
Glucose-Capillary: 215 mg/dL — ABNORMAL HIGH (ref 70–99)

## 2021-05-04 LAB — TSH: TSH: 2.569 u[IU]/mL (ref 0.350–4.500)

## 2021-05-04 LAB — VITAMIN D 25 HYDROXY (VIT D DEFICIENCY, FRACTURES): Vit D, 25-Hydroxy: 64.79 ng/mL (ref 30–100)

## 2021-05-04 LAB — APTT: aPTT: 91 seconds — ABNORMAL HIGH (ref 24–36)

## 2021-05-04 SURGERY — ARTHROPLASTY, SHOULDER, TOTAL, REVERSE
Anesthesia: General | Site: Shoulder | Laterality: Right

## 2021-05-04 MED ORDER — BUPIVACAINE-EPINEPHRINE (PF) 0.25% -1:200000 IJ SOLN
INTRAMUSCULAR | Status: AC
  Filled 2021-05-04: qty 30

## 2021-05-04 MED ORDER — BUPIVACAINE LIPOSOME 1.3 % IJ SUSP
INTRAMUSCULAR | Status: DC | PRN
  Administered 2021-05-04: 10 mL via PERINEURAL

## 2021-05-04 MED ORDER — BUPIVACAINE HCL (PF) 0.5 % IJ SOLN
INTRAMUSCULAR | Status: DC | PRN
  Administered 2021-05-04: 10 mL via PERINEURAL

## 2021-05-04 MED ORDER — METOCLOPRAMIDE HCL 5 MG PO TABS: 5.0000 mg | ORAL_TABLET | Freq: Three times a day (TID) | ORAL | Status: DC | PRN

## 2021-05-04 MED ORDER — POTASSIUM CHLORIDE CRYS ER 10 MEQ PO TBCR
10.0000 meq | EXTENDED_RELEASE_TABLET | Freq: Every day | ORAL | Status: DC
  Administered 2021-05-04 – 2021-05-11 (×8): 10 meq via ORAL
  Filled 2021-05-04 (×12): qty 1

## 2021-05-04 MED ORDER — CHLORHEXIDINE GLUCONATE 0.12 % MT SOLN
OROMUCOSAL | Status: AC
  Administered 2021-05-04: 15 mL via OROMUCOSAL
  Filled 2021-05-04: qty 15

## 2021-05-04 MED ORDER — DEXAMETHASONE SODIUM PHOSPHATE 4 MG/ML IJ SOLN
INTRAMUSCULAR | Status: DC | PRN
Start: 2021-05-04 — End: 2021-05-04
  Administered 2021-05-04: 4 mg via INTRAVENOUS

## 2021-05-04 MED ORDER — ONDANSETRON HCL 4 MG/2ML IJ SOLN: 4.0000 mg | Freq: Four times a day (QID) | INTRAMUSCULAR | Status: DC | PRN

## 2021-05-04 MED ORDER — LACTATED RINGERS IV SOLN: INTRAVENOUS | Status: DC | PRN

## 2021-05-04 MED ORDER — CEFAZOLIN SODIUM-DEXTROSE 2-4 GM/100ML-% IV SOLN
2.0000 g | Freq: Four times a day (QID) | INTRAVENOUS | Status: AC
  Administered 2021-05-05 (×3): 2 g via INTRAVENOUS
  Filled 2021-05-04 (×5): qty 100

## 2021-05-04 MED ORDER — INSULIN PEN NEEDLE 32G X 4 MM MISC: Freq: Every day | Status: DC

## 2021-05-04 MED ORDER — FENTANYL CITRATE (PF) 250 MCG/5ML IJ SOLN
INTRAMUSCULAR | Status: AC
  Filled 2021-05-04: qty 5

## 2021-05-04 MED ORDER — DOCUSATE SODIUM 100 MG PO CAPS
100.0000 mg | ORAL_CAPSULE | Freq: Two times a day (BID) | ORAL | Status: DC
  Administered 2021-05-04 – 2021-05-10 (×11): 100 mg via ORAL
  Filled 2021-05-04 (×13): qty 1

## 2021-05-04 MED ORDER — BUPIVACAINE-EPINEPHRINE (PF) 0.25% -1:200000 IJ SOLN
INTRAMUSCULAR | Status: DC | PRN
  Administered 2021-05-04: 10 mL

## 2021-05-04 MED ORDER — PROPOFOL 10 MG/ML IV BOLUS
INTRAVENOUS | Status: AC
  Filled 2021-05-04: qty 20

## 2021-05-04 MED ORDER — 0.9 % SODIUM CHLORIDE (POUR BTL) OPTIME
TOPICAL | Status: DC | PRN
  Administered 2021-05-04: 1000 mL

## 2021-05-04 MED ORDER — AMISULPRIDE (ANTIEMETIC) 5 MG/2ML IV SOLN
10.0000 mg | Freq: Once | INTRAVENOUS | Status: AC
  Administered 2021-05-04: 10 mg via INTRAVENOUS

## 2021-05-04 MED ORDER — FENTANYL CITRATE (PF) 100 MCG/2ML IJ SOLN: 50.0000 ug | Freq: Once | INTRAMUSCULAR | Status: AC

## 2021-05-04 MED ORDER — LACTATED RINGERS IV SOLN: INTRAVENOUS | Status: DC

## 2021-05-04 MED ORDER — FENTANYL CITRATE (PF) 100 MCG/2ML IJ SOLN
INTRAMUSCULAR | Status: AC
  Administered 2021-05-04: 50 ug via INTRAVENOUS
  Filled 2021-05-04: qty 2

## 2021-05-04 MED ORDER — POLYETHYLENE GLYCOL 3350 17 G PO PACK
17.0000 g | PACK | Freq: Every day | ORAL | Status: DC | PRN
  Administered 2021-05-05 – 2021-05-08 (×2): 17 g via ORAL
  Filled 2021-05-04: qty 1

## 2021-05-04 MED ORDER — ROCURONIUM BROMIDE 100 MG/10ML IV SOLN
INTRAVENOUS | Status: DC | PRN
  Administered 2021-05-04: 50 mg via INTRAVENOUS

## 2021-05-04 MED ORDER — MIDAZOLAM HCL 2 MG/2ML IJ SOLN
INTRAMUSCULAR | Status: AC
  Filled 2021-05-04: qty 2

## 2021-05-04 MED ORDER — MUPIROCIN 2 % EX OINT
1.0000 "application " | TOPICAL_OINTMENT | Freq: Two times a day (BID) | CUTANEOUS | Status: DC
  Administered 2021-05-04 – 2021-05-05 (×2): 1 via NASAL
  Filled 2021-05-04 (×3): qty 22

## 2021-05-04 MED ORDER — MENTHOL 3 MG MT LOZG: 1.0000 | LOZENGE | OROMUCOSAL | Status: DC | PRN

## 2021-05-04 MED ORDER — FUROSEMIDE 40 MG PO TABS
40.0000 mg | ORAL_TABLET | Freq: Every day | ORAL | Status: DC
  Administered 2021-05-05: 40 mg via ORAL
  Filled 2021-05-04: qty 1

## 2021-05-04 MED ORDER — FENTANYL CITRATE (PF) 100 MCG/2ML IJ SOLN: 25.0000 ug | INTRAMUSCULAR | Status: DC | PRN

## 2021-05-04 MED ORDER — ONDANSETRON HCL 4 MG PO TABS: 4.0000 mg | ORAL_TABLET | Freq: Four times a day (QID) | ORAL | Status: DC | PRN

## 2021-05-04 MED ORDER — DILTIAZEM LOAD VIA INFUSION
INTRAVENOUS | Status: DC | PRN
  Administered 2021-05-04 (×2): 5 mg via INTRAVENOUS

## 2021-05-04 MED ORDER — CHLORHEXIDINE GLUCONATE 0.12 % MT SOLN: 15.0000 mL | Freq: Once | OROMUCOSAL | Status: AC

## 2021-05-04 MED ORDER — TRANEXAMIC ACID-NACL 1000-0.7 MG/100ML-% IV SOLN
1000.0000 mg | Freq: Once | INTRAVENOUS | Status: AC
  Administered 2021-05-04: 1000 mg via INTRAVENOUS
  Filled 2021-05-04: qty 100

## 2021-05-04 MED ORDER — PHENOL 1.4 % MT LIQD: 1.0000 | OROMUCOSAL | Status: DC | PRN

## 2021-05-04 MED ORDER — AMISULPRIDE (ANTIEMETIC) 5 MG/2ML IV SOLN
INTRAVENOUS | Status: AC
  Filled 2021-05-04: qty 2

## 2021-05-04 MED ORDER — PROPOFOL 10 MG/ML IV BOLUS
INTRAVENOUS | Status: DC | PRN
  Administered 2021-05-04: 60 mg via INTRAVENOUS

## 2021-05-04 MED ORDER — SODIUM CHLORIDE 0.9 % IV SOLN: INTRAVENOUS | Status: DC | PRN

## 2021-05-04 MED ORDER — METOCLOPRAMIDE HCL 5 MG/ML IJ SOLN: 5.0000 mg | Freq: Three times a day (TID) | INTRAMUSCULAR | Status: DC | PRN

## 2021-05-04 MED ORDER — ORAL CARE MOUTH RINSE: 15.0000 mL | Freq: Once | OROMUCOSAL | Status: AC

## 2021-05-04 MED ORDER — PHENYLEPHRINE HCL-NACL 20-0.9 MG/250ML-% IV SOLN
INTRAVENOUS | Status: DC | PRN
Start: 2021-05-04 — End: 2021-05-04
  Administered 2021-05-04: 25 ug/min via INTRAVENOUS

## 2021-05-04 SURGICAL SUPPLY — 71 items
BAG COUNTER SPONGE SURGICOUNT (BAG) ×2 IMPLANT
BAG SPNG CNTER NS LX DISP (BAG) ×1
BAG SURGICOUNT SPONGE COUNTING (BAG) ×1
BASEPLATE GLENOSPHERE 25 (Plate) ×1 IMPLANT
BASEPLATE GLENOSPHERE 25MM (Plate) ×1 IMPLANT
BEARING CROSSLINK RSA 36 (Joint) ×2 IMPLANT
BIT DRILL TWIST 2.7 (BIT) ×1 IMPLANT
BIT DRILL TWIST 2.7MM (BIT) ×1
BRNG HUM STD 36 RVRS SHDR PRLG (Joint) ×1 IMPLANT
CLOSURE WOUND 1/2 X4 (GAUZE/BANDAGES/DRESSINGS) ×1
COVER SURGICAL LIGHT HANDLE (MISCELLANEOUS) ×3 IMPLANT
DRAPE C-ARM 42X72 X-RAY (DRAPES) ×3 IMPLANT
DRAPE IMP U-DRAPE 54X76 (DRAPES) ×3 IMPLANT
DRAPE INCISE IOBAN 66X45 STRL (DRAPES) ×3 IMPLANT
DRAPE ORTHO SPLIT 77X108 STRL (DRAPES) ×6
DRAPE SURG ORHT 6 SPLT 77X108 (DRAPES) ×2 IMPLANT
DRAPE U-SHAPE 47X51 STRL (DRAPES) ×3 IMPLANT
DRSG ADAPTIC 3X8 NADH LF (GAUZE/BANDAGES/DRESSINGS) ×2 IMPLANT
DRSG EMULSION OIL 3X3 NADH (GAUZE/BANDAGES/DRESSINGS) ×3 IMPLANT
DRSG PAD ABDOMINAL 8X10 ST (GAUZE/BANDAGES/DRESSINGS) ×3 IMPLANT
DURAPREP 26ML APPLICATOR (WOUND CARE) ×3 IMPLANT
ELECT BLADE 4.0 EZ CLEAN MEGAD (MISCELLANEOUS) ×3
ELECT NDL BLADE 2-5/6 (NEEDLE) ×1 IMPLANT
ELECT NEEDLE BLADE 2-5/6 (NEEDLE) ×3 IMPLANT
ELECT REM PT RETURN 9FT ADLT (ELECTROSURGICAL) ×3
ELECTRODE BLDE 4.0 EZ CLN MEGD (MISCELLANEOUS) IMPLANT
ELECTRODE REM PT RTRN 9FT ADLT (ELECTROSURGICAL) ×1 IMPLANT
GAUZE SPONGE 4X4 12PLY STRL (GAUZE/BANDAGES/DRESSINGS) ×3 IMPLANT
GAUZE SPONGE 4X4 16PLY XRAY LF (GAUZE/BANDAGES/DRESSINGS) ×2 IMPLANT
GLENOID SPHERE STD STRL 36MM (Orthopedic Implant) ×2 IMPLANT
GLOVE SURG ORTHO LTX SZ7.5 (GLOVE) ×3 IMPLANT
GLOVE SURG ORTHO LTX SZ8.5 (GLOVE) ×3 IMPLANT
GLOVE SURG POLY ORTHO LF SZ7.5 (GLOVE) ×3 IMPLANT
GLOVE SURG POLY ORTHO LF SZ8 (GLOVE) ×3 IMPLANT
GOWN STRL REUS W/ TWL XL LVL3 (GOWN DISPOSABLE) ×2 IMPLANT
GOWN STRL REUS W/TWL XL LVL3 (GOWN DISPOSABLE) ×6
KIT BASIN OR (CUSTOM PROCEDURE TRAY) ×3 IMPLANT
KIT TURNOVER KIT B (KITS) ×3 IMPLANT
MANIFOLD NEPTUNE II (INSTRUMENTS) ×3 IMPLANT
NDL 1/2 CIR MAYO (NEEDLE) IMPLANT
NDL HYPO 25GX1X1/2 BEV (NEEDLE) IMPLANT
NEEDLE 1/2 CIR MAYO (NEEDLE) ×3 IMPLANT
NEEDLE HYPO 25GX1X1/2 BEV (NEEDLE) IMPLANT
NS IRRIG 1000ML POUR BTL (IV SOLUTION) ×3 IMPLANT
PACK SHOULDER (CUSTOM PROCEDURE TRAY) ×3 IMPLANT
PAD ABD 8X10 STRL (GAUZE/BANDAGES/DRESSINGS) ×2 IMPLANT
PAD ARMBOARD 7.5X6 YLW CONV (MISCELLANEOUS) ×6 IMPLANT
PIN THREADED REVERSE (PIN) ×2 IMPLANT
SCREW BONE LOCKING 4.75X30X3.5 (Screw) ×4 IMPLANT
SCREW BONE STRL 6.5MMX25MM (Screw) ×2 IMPLANT
SLING ARM FOAM STRAP XLG (SOFTGOODS) ×2 IMPLANT
SPONGE T-LAP 4X18 ~~LOC~~+RFID (SPONGE) ×6 IMPLANT
STAPLER VISISTAT (STAPLE) ×2 IMPLANT
STEM HUMERAL STRL 7MMX83MM (Stem) ×2 IMPLANT
STRIP CLOSURE SKIN 1/2X4 (GAUZE/BANDAGES/DRESSINGS) ×2 IMPLANT
SUCTION FRAZIER HANDLE 10FR (MISCELLANEOUS) ×3
SUCTION TUBE FRAZIER 10FR DISP (MISCELLANEOUS) ×1 IMPLANT
SUT FIBERWIRE #2 38 T-5 BLUE (SUTURE)
SUT MAXBRAID (SUTURE) ×10 IMPLANT
SUT MNCRL AB 4-0 PS2 18 (SUTURE) ×3 IMPLANT
SUT VIC AB 0 CT1 27 (SUTURE) ×9
SUT VIC AB 0 CT1 27XBRD ANBCTR (SUTURE) ×1 IMPLANT
SUT VIC AB 2-0 CT1 27 (SUTURE) ×9
SUT VIC AB 2-0 CT1 TAPERPNT 27 (SUTURE) ×1 IMPLANT
SUT VICRYL 0 27 CT2 27 ABS (SUTURE) ×2 IMPLANT
SUTURE FIBERWR #2 38 T-5 BLUE (SUTURE) IMPLANT
SYR 50ML LL SCALE MARK (SYRINGE) ×2 IMPLANT
SYR CONTROL 10ML LL (SYRINGE) ×3 IMPLANT
TOWEL GREEN STERILE (TOWEL DISPOSABLE) ×3 IMPLANT
TOWEL GREEN STERILE FF (TOWEL DISPOSABLE) ×3 IMPLANT
TRAY HUM MINI SHOULDER +3 40 (Joint) ×2 IMPLANT

## 2021-05-04 NOTE — Progress Notes (Signed)
PROGRESS NOTE  AMORINA DOERR  INO:[id number] DOB: 1923/02/10 DOA: 05/02/2021 PCP: [name], MD   Brief Narrative: KATHLYNN SWOFFORD is a 87 y.o. female with a history of chronic AFib on xarelto, HTN, HLD, and insomnia who presented to the ED 12/10 with severe right shoulder pain after a fall at home at night after taking her first dose restarting temazepam for insomnia. She's had intermittent elevated heart rates at home and was confirmed to have rapid atrial fibrillation in the ED up to 150's bpm. BP was soft, and oxygen saturation was marginal for which 2L O2 was applied. XR confirmed a severely displaced fracture of the right humerus with old acromial fracture. X-ray of the chest, pelvis, CT head, and CT cervical spine showed no acute abnormality. Orthopedics recommended operative management after rate control and xarelto washout period. Diltiazem bolus and infusion as well as heparin infusion were started on admission with cardiology consulting.  Assessment & Plan: Principal Problem:   Right humeral fracture Active Problems:   DM type 2, controlled, with complication (HCC)   Persistent atrial fibrillation with rapid ventricular response (HCC)   Osteoporosis   Chronic anticoagulation   Hyperlipidemia   Chronic insomnia   Chronic congestive heart failure with left ventricular diastolic dysfunction (HCC)   Chronic diastolic heart failure ([place])   Fall at home, initial encounter   Atrial fibrillation with RVR ([place])  Fall at home due to benzodiazepine dose:  - Avoid sedating medications, fall precautions, PT evaluation postoperatively.   Traumatic acute displaced, closed right humerus fracture: Old acromial fracture is not a significant findings per ortho.  - Operative management planned 12/12, especially considering her chronic left arm debility from dislocations requiring surgery.  - Continue pain control as ordered.  Chronic atrial fibrillation with intermittent RVR: TSH 2.569.   - Continue digoxin, level is 0.7.  - Continue diltiazem infusion, increased rate of infusion with improved control. Holding BB currently. Rate control complicated by wide variations and intermittent soft blood pressure. Appreciate cardiology assistance.  - Heparin bridging given high CHA2DS2-VASc score (6) while holding xarelto. Stop at 10am, resume timing per ortho. - Check TSH (last checked in 2014)  Chronic HFpEF, HTN: Euvolemic without recent symptoms of decompensation.  - No diuretic currently indicated. - Holding losartan, HCTZ with soft BP. - Continue monitoring I/O, weights.   T2DM: HbA1c 8.6% indicating poor control, even with liberalized goal for age.  - Holding metformin.  - Home humalog 50/50 mix 5u daily changed to levemir 5u daily+ SSI, at inpatient goal.  Hypokalemia:  - Supplemented, at goal >4.   Osteoporosis: Vitamin D level is 64.  Aortic dissection: Chronic, not an acute issue.  Insomnia:  - Trial trazodone lowest dose qHS prn  DVT prophylaxis: Heparin gtt, off at 10am. Code Status: Full Family Communication: None at bedside Disposition Plan:  Status is: Inpatient  Remains inpatient appropriate because: IV therapies pending surgery  Consultants:  Cardiology Orthopedics  Procedures:  ORIF proximal right humerus fracture planned 05/04/2021 Dr. Veverly Fells  Antimicrobials: None   Subjective: No new complaints. Pain remains severe in the right shoulder with any movement. No chest pain or dyspnea, can't tell when she's in AFib.   Objective: Vitals:   05/04/21 0038 05/04/21 0410 05/04/21 0450 05/04/21 0852  BP: 132/90 (!) 145/82    Pulse: 64 (!) 119  (!) 103  Resp: 19 19    Temp: 98.1 F (36.7 C) 98.5 F (36.9 C)    TempSrc: Oral Oral  SpO2: 91% 90%    Weight:   65.5 kg   Height:        Intake/Output Summary (Last 24 hours) at 05/04/2021 1457 Last data filed at 05/04/2021 1236 Gross per 24 hour  Intake 360 ml  Output 800 ml  Net -440 ml    Filed Weights   05/02/21 2317 05/03/21 0300 05/04/21 0450  Weight: 66.5 kg 67 kg 65.5 kg   Gen: 87 y.o. female in no distress Pulm: Nonlabored breathing room air. Clear. CV: Labile irregular. No murmur, rub, or gallop. No JVD, no pitting dependent edema. GI: Abdomen soft, non-tender, non-distended, with normoactive bowel sounds.  Ext: Warm, RUE in sling, limited ROM. TTP at shoulder Hand/fingers with intact strength and sensation. Skin: No new rashes, lesions or ulcers on visualized skin. Neuro: Alert and oriented. No focal neurological deficits. Psych: Judgement and insight appear fair. Mood euthymic & affect congruent. Behavior is appropriate.    Data Reviewed: I have personally reviewed following labs and imaging studies  CBC: Recent Labs  Lab 05/02/21 0506 05/03/21 0340 05/04/21 0105  WBC 9.0 6.4 6.6  NEUTROABS 6.4  --   --   HGB 14.2 11.6* 11.2*  HCT 42.8 34.7* 33.3*  MCV 95.5 96.7 96.8  PLT 190 157 948   Basic Metabolic Panel: Recent Labs  Lab 05/02/21 0506 05/03/21 0340 05/04/21 0105  NA 136 132* 133*  K 3.2* 4.2 4.1  CL 97* 100 100  CO2 30 23 23   GLUCOSE 228* 190* 154*  BUN 24* 17 17  CREATININE 0.90 0.68 0.70  CALCIUM 9.5 7.9* 8.2*  MG 1.9  --   --    GFR: Estimated Creatinine Clearance: 37.4 mL/min (by C-G formula based on SCr of 0.7 mg/dL). Liver Function Tests: No results for input(s): AST, ALT, ALKPHOS, BILITOT, PROT, ALBUMIN in the last 168 hours. No results for input(s): LIPASE, AMYLASE in the last 168 hours. No results for input(s): AMMONIA in the last 168 hours. Coagulation Profile: No results for input(s): INR, PROTIME in the last 168 hours. Cardiac Enzymes: No results for input(s): CKTOTAL, CKMB, CKMBINDEX, TROPONINI in the last 168 hours. BNP (last 3 results) No results for input(s): PROBNP in the last 8760 hours. HbA1C: Recent Labs    05/03/21 0340  HGBA1C 8.6*   CBG: Recent Labs  Lab 05/03/21 1138 05/03/21 1624 05/03/21 2125  05/04/21 0838 05/04/21 1150  GLUCAP 146* 175* 150* 173* 160*   Lipid Profile: No results for input(s): CHOL, HDL, LDLCALC, TRIG, CHOLHDL, LDLDIRECT in the last 72 hours. Thyroid Function Tests: Recent Labs    05/04/21 0105  TSH 2.569   Anemia Panel: No results for input(s): VITAMINB12, FOLATE, FERRITIN, TIBC, IRON, RETICCTPCT in the last 72 hours. Urine analysis:    Component Value Date/Time   COLORURINE YELLOW 05/03/2021 0645   APPEARANCEUR CLEAR 05/03/2021 0645   LABSPEC 1.021 05/03/2021 0645   PHURINE 6.0 05/03/2021 0645   GLUCOSEU 150 (A) 05/03/2021 0645   HGBUR NEGATIVE 05/03/2021 0645   BILIRUBINUR NEGATIVE 05/03/2021 0645   BILIRUBINUR n 07/23/2013 1343   KETONESUR 5 (A) 05/03/2021 0645   PROTEINUR NEGATIVE 05/03/2021 0645   UROBILINOGEN 0.2 07/23/2013 1343   UROBILINOGEN 0.2 03/07/2012 2123   NITRITE NEGATIVE 05/03/2021 0645   LEUKOCYTESUR NEGATIVE 05/03/2021 0645   Recent Results (from the past 240 hour(s))  Resp Panel by RT-PCR (Flu A&B, Covid) Nasopharyngeal Swab     Status: None   Collection Time: 05/02/21  8:49 AM   Specimen: Nasopharyngeal Swab;  Nasopharyngeal(NP) swabs in vial transport medium  Result Value Ref Range Status   SARS Coronavirus 2 by RT PCR NEGATIVE NEGATIVE Final    Comment: (NOTE) SARS-CoV-2 target nucleic acids are NOT DETECTED.  The SARS-CoV-2 RNA is generally detectable in upper respiratory specimens during the acute phase of infection. The lowest concentration of SARS-CoV-2 viral copies this assay can detect is 138 copies/mL. A negative result does not preclude SARS-Cov-2 infection and should not be used as the sole basis for treatment or other patient management decisions. A negative result may occur with  improper specimen collection/handling, submission of specimen other than nasopharyngeal swab, presence of viral mutation(s) within the areas targeted by this assay, and inadequate number of viral copies(<138 copies/mL). A  negative result must be combined with clinical observations, patient history, and epidemiological information. The expected result is Negative.  Fact Sheet for Patients:  EntrepreneurPulse.com.au  Fact Sheet for Healthcare Providers:  IncredibleEmployment.be  This test is no t yet approved or cleared by the [place] FDA and  has been authorized for detection and/or diagnosis of SARS-CoV-2 by FDA under an Emergency Use Authorization (EUA). This EUA will remain  in effect (meaning this test can be used) for the duration of the COVID-19 declaration under Section 564(b)(1) of the Act, 21 U.S.C.section 360bbb-3(b)(1), unless the authorization is terminated  or revoked sooner.       Influenza A by PCR NEGATIVE NEGATIVE Final   Influenza B by PCR NEGATIVE NEGATIVE Final    Comment: (NOTE) The Xpert Xpress SARS-CoV-2/FLU/RSV plus assay is intended as an aid in the diagnosis of influenza from Nasopharyngeal swab specimens and should not be used as a sole basis for treatment. Nasal washings and aspirates are unacceptable for Xpert Xpress SARS-CoV-2/FLU/RSV testing.  Fact Sheet for Patients: EntrepreneurPulse.com.au  Fact Sheet for Healthcare Providers: IncredibleEmployment.be  This test is not yet approved or cleared by the [place] FDA and has been authorized for detection and/or diagnosis of SARS-CoV-2 by FDA under an Emergency Use Authorization (EUA). This EUA will remain in effect (meaning this test can be used) for the duration of the COVID-19 declaration under Section 564(b)(1) of the Act, 21 U.S.C. section 360bbb-3(b)(1), unless the authorization is terminated or revoked.  Performed at [facility], [place] [address], [place], [place] [address]   Surgical PCR screen     Status: None   Collection Time: 05/04/21  8:28 AM   Specimen: Nasal Mucosa; Nasal Swab  Result Value Ref Range Status    MRSA, PCR NEGATIVE NEGATIVE Final   Staphylococcus aureus NEGATIVE NEGATIVE Final    Comment: (NOTE) The Xpert SA Assay (FDA approved for NASAL specimens in patients 74 years of age and older), is one component of a comprehensive surveillance program. It is not intended to diagnose infection nor to guide or monitor treatment. Performed at [facility], [place] [address], [place], [place] [address]       Radiology Studies: CT PELVIS WO CONTRAST  Result Date: 05/03/2021 CLINICAL DATA:  Pelvic fracture. EXAM: CT PELVIS WITHOUT CONTRAST TECHNIQUE: Multidetector CT imaging of the pelvis was performed following the standard protocol without intravenous contrast. COMPARISON:  CT abdomen pelvis dated 09/28/2016. FINDINGS: Evaluation of this exam is limited in the absence of intravenous contrast. Urinary Tract:  No abnormality visualized. Bowel: Large amount of stool throughout the colon. No bowel dilatation or inflammation. The appendix is normal. Vascular/Lymphatic: Advanced aortoiliac atherosclerotic disease. The IVC is unremarkable no adenopathy. Reproductive:  Hysterectomy. Other:  Partially visualized layering stones within the gallbladder. Musculoskeletal: Severe osteopenia. Old healed bilateral pubic bone fractures. Partially visualized right femoral intramedullary nail. The visualized hardware appears intact. No acute osseous pathology. IMPRESSION: 1. No acute osseous pathology. Old healed bilateral pubic bone fractures. 2. Large amount of stool throughout the colon. 3. Cholelithiasis. 4. Aortic Atherosclerosis (ICD10-I70.0). Electronically Signed   By: Anner Crete M.D.   On: 05/03/2021 01:19    Scheduled Meds:  digoxin  0.0625 mg Oral Daily   insulin aspart  0-6 Units Subcutaneous TID WC   insulin detemir  5 Units Subcutaneous Daily   mupirocin ointment  1 application Nasal BID   polyethylene glycol  17 g Oral Daily   senna-docusate  1 tablet Oral BID   sodium chloride flush  3 mL  Intravenous Q12H   Continuous Infusions:   ceFAZolin (ANCEF) IV     diltiazem (CARDIZEM) infusion 12.5 mg/hr (05/04/21 1451)   tranexamic acid       LOS: 2 days   Time spent: 25 minutes.  Patrecia Pour, MD [facility] [email or web address] 05/04/2021, 2:57 PM

## 2021-05-04 NOTE — Interval H&P Note (Signed)
History and Physical Interval Note:  05/04/2021 7:22 AM  Tara Bradley  has presented today for surgery, with the diagnosis of Right Proximal Humerus Fracture.  The various methods of treatment have been discussed with the patient and family. After consideration of risks, benefits and other options for treatment, the patient has consented to  Procedure(s): OPEN REDUCTION INTERNAL FIXATION (ORIF) PROXIMAL HUMERUS FRACTURE (Right) as a surgical intervention.  The patient's history has been reviewed, patient examined, no change in status, stable for surgery.  I have reviewed the patient's chart and labs.  Questions were answered to the patient's satisfaction.     Augustin Schooling

## 2021-05-04 NOTE — Progress Notes (Signed)
ANTICOAGULATION CONSULT NOTE - Follow-up Consult  Pharmacy Consult for heparin Indication: atrial fibrillation  Allergies  Allergen Reactions   Ambien [Zolpidem Tartrate]     Hallucinations    Codeine Other (See Comments)    headache   Codeine Sulfate     REACTION: unspecified   Latex Hives   Remeron [Mirtazapine]     Patient Measurements: Height: 5\' 3"  (160 cm) Weight: 65.5 kg (144 lb 8 oz) IBW/kg (Calculated) : 52.4 Heparin Dosing Weight: 63.5 kg  Vital Signs: Temp: 98.5 F (36.9 C) (12/12 0410) Temp Source: Oral (12/12 0410) BP: 145/82 (12/12 0410) Pulse Rate: 119 (12/12 0410)  Labs: Recent Labs    05/02/21 0506 05/03/21 0340 05/03/21 1223 05/04/21 0105  HGB 14.2 11.6*  --  11.2*  HCT 42.8 34.7*  --  33.3*  PLT 190 157  --  153  APTT  --  56* 70* Tara*  HEPARINUNFRC  --  0.64  --  0.49  CREATININE 0.90 0.68  --  0.70     Estimated Creatinine Clearance: 37.4 mL/min (by C-G formula based on SCr of 0.7 mg/dL).   Medical History: Past Medical History:  Diagnosis Date   Aortic valve sclerosis    Echo, 2008   Arthritis    Bradycardia    October, 2012   Cancer [facility])    Chronic insomnia    Closed fracture of unspecified part of femur 2005   Diabetes mellitus, type 2 ([place])    Diverticulitis    Diverticulosis    Ejection fraction    EF 60%, echo, February, 2008  //   EF 65-70%, echo, November, 2012   Hyperlipidemia    Hypertension    Long term (current) use of anticoagulants    Osteopenia    Osteoporosis    femur fracture 2005, pelvic fracture 2006   Persistent atrial fibrillation (HCC)    Personal history of malignant neoplasm of breast    Syncope    Thoracic aortic aneurysm 02/2017   Unspecified closed fracture of pelvis 2006    Medications:  Medications Prior to Admission  Medication Sig Dispense Refill Last Dose   acetaminophen (TYLENOL) 325 MG tablet Take 2 tablets (650 mg total) by mouth every 6 (six) hours. (Patient taking differently:  Take 650 mg by mouth every 6 (six) hours as needed for mild pain or headache.)   Past Month   BD PEN NEEDLE NANO U/F 32G X 4 MM MISC Inject into the skin daily.      Calcium Carbonate-Vitamin D 600-400 MG-UNIT tablet Take 1 tablet by mouth daily.   05/01/2021   Cholecalciferol (VITAMIN D) 2000 UNITS tablet Take 1,000 Units by mouth daily.   05/01/2021   digoxin (LANOXIN) 0.125 MG tablet TAKE (1/2) TABLET DAILY. (Patient taking differently: Take 0.0625 mg by mouth daily.) 90 tablet 3 05/01/2021   diltiazem (CARDIZEM CD) 300 MG 24 hr capsule TAKE ONE CAPSULE BY MOUTH EVERY DAY (Patient taking differently: Take 300 mg by mouth daily.) 90 capsule 1 05/01/2021   furosemide (LASIX) 40 MG tablet Take 40 mg by mouth daily.    05/01/2021   HUMALOG MIX 50/50 KWIKPEN (50-50) 100 UNIT/ML KwikPen Inject 5 Units into the skin daily.   05/01/2021   losartan-hydrochlorothiazide (HYZAAR) 100-12.5 MG tablet Take 1 tablet by mouth every morning.   05/01/2021   metFORMIN (GLUMETZA) 500 MG (MOD) 24 hr tablet Take 1,000 mg by mouth every evening.   05/01/2021   metoprolol tartrate (LOPRESSOR) 25 MG tablet TAKE 1  TABLET BY MOUTH TWICE DAILY. (Patient taking differently: Take 25 mg by mouth 2 (two) times daily.) 180 tablet 4 05/01/2021 at 2000   potassium chloride (K-DUR) 10 MEQ tablet Take 10 mEq by mouth daily.    05/01/2021   temazepam (RESTORIL) 15 MG capsule Take 15 mg by mouth at bedtime.   05/01/2021   XARELTO 15 MG TABS tablet TAKE 1 TABLET ONCE DAILY WITH SUPPER. (Patient taking differently: 15 mg daily with supper.) 90 tablet 1 05/01/2021 at 1900    Assessment: Tara Bradley who presents with non syncopal fall noted to have Afib with RVR. On Xarelto PTA with last dose taken on 12/9 at 7 PM. Pharmacy consulted to transition to IV heparin.   APPT 90 seconds today and haprin level 0.5 at goal on heparin drip 1000 uts/hr.  Plan to stop at 10am in prep for OR.   HGB drop since admit but stable and no overt bleeding noted,. Platelets  are stable.  Goal of Therapy:  Heparin level 0.3-0.7 units/ml aPTT 66-102 seconds Monitor platelets by anticoagulation protocol: Yes   Plan:  Continue IV heparin gtt 1000 units/h - stop  Follow up after OR  Monitor for s/sx of bleeding  Bonnita Nasuti Pharm.D. CPP, BCPS Clinical Pharmacist [phone] 05/04/2021 8:09 AM    **Pharmacist phone directory can be found on [email or web address] listed under [place]**

## 2021-05-04 NOTE — Transfer of Care (Signed)
Immediate Anesthesia Transfer of Care Note  Patient: Tara Bradley  Procedure(s) Performed: REVERSE SHOULDER ARTHROPLASTY (Right: Shoulder)  Patient Location: PACU  Anesthesia Type General with regional   Level of Consciousness: drowsy, patient cooperative and responds to stimulation  Airway & Oxygen Therapy: Patient Spontanous Breathing and Patient connected to nasal cannula oxygen  Post-op Assessment: Report given to RN and Post -op Vital signs reviewed and stable  Post vital signs: Reviewed and Stable   Last Vitals:  Vitals Value Taken Time  BP 121/67   Temp    Pulse 95 05/04/21 1916  Resp 19 05/04/21 1916  SpO2 96 % 05/04/21 1916  Vitals shown include unvalidated device data.  Last Pain:  Vitals:   05/04/21 1528  TempSrc: Oral  PainSc:       Patients Stated Pain Goal: 0 (53/96/72 8979)  Complications: No notable events documented.

## 2021-05-04 NOTE — Op Note (Signed)
NAME: HOWARD, PATTON MEDICAL RECORD NO: [id number] ACCOUNT NO: [id number] DATE OF BIRTH: [birth year] FACILITY: [facility] LOCATION: [facility] PHYSICIAN: Doran Heater. Veverly Fells, MD  Operative Report   DATE OF PROCEDURE: 05/04/2021   PREOPERATIVE DIAGNOSIS:  Right proximal humerus fracture, comminuted and displaced.  POSTOPERATIVE DIAGNOSIS:  Right proximal humerus fracture, comminuted and displaced.  PROCEDURE PERFORMED:  Right shoulder reverse total shoulder arthroplasty using Biomet prosthesis with tuberosity repair.  ATTENDING SURGEON:  Dr. Earlie Counts.  ASSISTANT:  Darol Destine, [place], who was scrubbed during the entire procedure, and necessary for satisfactory completion of surgery.  General anesthesia was used plus interscalene block.  ESTIMATED BLOOD LOSS:  About 100 mL.  FLUID REPLACEMENT:  1500 mL crystalloid.  Instrument counts were correct.  There were no complications.  Perioperative antibiotics were given.  INDICATIONS:  The patient is a 87 year old female who suffered a fall injuring her right shoulder, resulting in a displaced proximal humerus fracture on the right.  Initial look at this looked like it was a surgical neck fracture.  The patient has a left  shoulder that has poor function and is limiting to her.  We discussed all options for management.  Due to the displacement and the comminution, we recommended ORIF versus reverse shoulder replacement hoping to be able to reduce this easily and apply a  lateral plate.  I discussed this with the patient and she agreed to surgical management to restore alignment to her fracture, allow better healing and facilitate recovery.  Informed consent obtained.  DESCRIPTION OF PROCEDURE:  After an adequate level of anesthesia was achieved, the patient was positioned in modified beach chair position.  Right shoulder was correctly identified and sterilely prepped and draped in the usual manner.  Timeout called,  verifying correct patient,  correct site.  We utilized a deltopectoral incision as an approach to the shoulder, we started at the coracoid process extending down to the anterior humerus.  Dissection down through subcutaneous tissues were identified the  cephalic vein, we took that laterally with the deltoid.  Pectoralis was taken medially.  Conjoined tendon identified and retracted medially.  The clavipectoral fascia was released.  There was a large hematoma, which was evacuated.  The fractured shoulder  was assessed.  We started to do our reduction.  The humeral head was spun and rotated at least 90 degrees and we noted there to be a significantly displaced greater tuberosity component to this with a component of the head attached to it.  This  represented essentially head split and as we rotated the entire proximal portion back, the lesser tuberosity came off.  We felt like this was an unstable fracture pattern nonamenable to ORIF with any chances of success.  I did not feel a suture repair  would give adequate stability to try to mobilize. That is when we went ahead and changed our plan to a reverse shoulder replacement, which will provide instant stability and constraint and improved function with that shoulder immediately.  At this point,  we separated the lesser tuberosity.  We debulked it.  We placed #2 MaxBraid suture medial to the lesser tuberosity in the subscapularis in a modified Mason-Allen suture technique x2.  We then did that x3 on the greater tuberosity side and debulked that  we removed the head, used that for bone graft to finish the final tuberosity repair.  We then retracted the humerus and had good exposure of the glenoid face.  We removed the glenoid labrum.  We then removed  the cartilage off the glenoid face and then  placed our guide pin centered low on the glenoid for the baseplate preparation, we reamed for the baseplate.  We then impacted the baseplate into position.  We placed a 25 central screw, which had  good purchase.  We then placed a 30 screw superiorly and  a 30 screw inferiorly.  These were locked screws.  We did not have enough AP width for anterior and posterior screws.  Once we had our baseplate secure, we selected a real 36 glenosphere and we set that on the B setting and angled that eccentricity  inferiorly and we impacted onto the baseplate.  We had good placement and good security with that glenosphere.  We then went to the humeral side.  We started reaming and reamed up to a size 7 and then we impacted the trial 7 stem into place.  This was a  mini stem from the Biomet comprehensive system.  Once we had that mini stem in 25-30 degrees of retroversion, we trialled with the standard offset poly and we placed that onto the humerus and reduced the shoulder.  We had excellent soft tissue balancing  and good stability.  We then removed the trial components.  We irrigated thoroughly.  We then used the press-fit stem, we chose a size 7 mini stem for the Biomet comprehensive system and we placed that in about 25 degrees of retroversion.  We impacted  that in position.  We then selected the real tray, which was the mini tray with the standard +3 poly.  No additional offset and then we impacted that in the appropriate orientation, we then reduced the shoulder, we got a nice little pop as it reduced.   We then reduced the tuberosities.  We did do a couple drill holes in the humerus with suture for repair of the tuberosities and also around the world stitch around the backside of the stem.  We first placed around the world stitch lateral to the greater  tuberosity medial to the lesser tuberosity.  We then bone grafted the proximal humerus after irrigating extensively. We reduced the tuberosities, tied tuberosities to each other.  We then tied the shaft to the tuberosities with our shaft sutures and then  we finished with the compression against the stem around the world stitch, everything moved together as a  unit.  We were pleased with our tuberosity repair.  We oversewed the rotator interval there right at the top with a figure-of-eight #2 MaxBraid  suture.  We ranged the shoulder, there was no limitation whatsoever in range of motion with our tuberosities repaired, everything moving together as a unit.  We were very pleased with our soft tissue balancing.  At this point, we irrigated thoroughly and  repaired the deltopectoral interval with 0 Vicryl suture followed by 2-0 Vicryl for subcutaneous closure and staples for skin.  Sterile bandages applied.  The patient was placed in a shoulder sling and taken to recovery room in stable condition.     SUJ D: 05/04/2021 7:18:58 pm T: 05/04/2021 9:52:00 pm  JOB: [id number]/ [id number]

## 2021-05-04 NOTE — Progress Notes (Signed)
Orthopedics Progress Note  Subjective: Patient still in a lot of pain during movement  Objective:  Vitals:   05/04/21 0038 05/04/21 0410  BP: 132/90 (!) 145/82  Pulse: 64 (!) 119  Resp: 19 19  Temp: 98.1 F (36.7 C) 98.5 F (36.9 C)  SpO2: 91% 90%    General: Awake and alert  Musculoskeletal: Right shoulder swollen and bruised Neurovascularly intact  Lab Results  Component Value Date   WBC 6.6 05/04/2021   HGB 11.2 (L) 05/04/2021   HCT 33.3 (L) 05/04/2021   MCV 96.8 05/04/2021   PLT 153 05/04/2021       Component Value Date/Time   NA 133 (L) 05/04/2021 0105   NA 139 04/06/2019 1235   K 4.1 05/04/2021 0105   CL 100 05/04/2021 0105   CO2 23 05/04/2021 0105   GLUCOSE 154 (H) 05/04/2021 0105   BUN 17 05/04/2021 0105   BUN 17 04/06/2019 1235   CREATININE 0.70 05/04/2021 0105   CREATININE 0.71 12/31/2015 1504   CALCIUM 8.2 (L) 05/04/2021 0105   GFRNONAA >60 05/04/2021 0105   GFRAA >60 10/19/2019 1640    Lab Results  Component Value Date   INR 1.11 03/11/2017   INR 1.09 03/11/2017   INR 1.16 03/10/2017    Assessment/Plan: OPEN REDUCTION INTERNAL FIXATION (ORIF) PROXIMAL HUMERUS FRACTURE planned for later today around 5 pm Discussed with pharmacist and plan to stop IV heparin at 10AM this morning. NPO Patient and family agrees with the plan  Doran Heater. Veverly Fells, MD 05/04/2021 7:23 AM

## 2021-05-04 NOTE — Anesthesia Preprocedure Evaluation (Addendum)
Anesthesia Evaluation  Patient identified by MRN, date of birth, ID band Patient awake    Reviewed: Allergy & Precautions, NPO status , Patient's Chart, lab work & pertinent test results  History of Anesthesia Complications Negative for: history of anesthetic complications  Airway Mallampati: II  TM Distance: >3 FB Neck ROM: Full    Dental  (+) Dental Advisory Given   Pulmonary neg pulmonary ROS,    breath sounds clear to auscultation       Cardiovascular hypertension, Pt. on medications and Pt. on home beta blockers + dysrhythmias Atrial Fibrillation  Rhythm:Irregular Rate:Normal  09/2020 ECHO: EF 60 to 65%. LV has normal function.  Right ventricular systolic function is normal, mild MR, mild-mod TR   Neuro/Psych negative neurological ROS     GI/Hepatic negative GI ROS, Neg liver ROS,   Endo/Other  diabetes (glu 108), Insulin Dependent, Oral Hypoglycemic Agents  Renal/GU negative Renal ROS     Musculoskeletal   Abdominal   Peds  Hematology Xarelto: last dose friday   Anesthesia Other Findings   Reproductive/Obstetrics                            Anesthesia Physical Anesthesia Plan  ASA: 3  Anesthesia Plan: General   Post-op Pain Management: Regional block   Induction:   PONV Risk Score and Plan: 3 and Ondansetron, Dexamethasone and Treatment may vary due to age or medical condition  Airway Management Planned: Oral ETT  Additional Equipment: None  Intra-op Plan:   Post-operative Plan: Extubation in OR  Informed Consent: I have reviewed the patients History and Physical, chart, labs and discussed the procedure including the risks, benefits and alternatives for the proposed anesthesia with the patient or authorized representative who has indicated his/her understanding and acceptance.     Dental advisory given  Plan Discussed with: CRNA and Surgeon  Anesthesia Plan Comments:  (Plan routine monitors, GETA with interscalene block for post op analgesia)        Anesthesia Quick Evaluation

## 2021-05-04 NOTE — Anesthesia Postprocedure Evaluation (Signed)
Anesthesia Post Note  Patient: Tara Bradley  Procedure(s) Performed: REVERSE SHOULDER ARTHROPLASTY (Right: Shoulder)     Patient location during evaluation: PACU Anesthesia Type: General Level of consciousness: awake and alert Pain management: pain level controlled Vital Signs Assessment: post-procedure vital signs reviewed and stable Respiratory status: spontaneous breathing, nonlabored ventilation, respiratory function stable and patient connected to nasal cannula oxygen Cardiovascular status: blood pressure returned to baseline and stable Postop Assessment: no apparent nausea or vomiting Anesthetic complications: no   No notable events documented.  Last Vitals:  Vitals:   05/04/21 2000 05/04/21 2028  BP: 126/81 132/84  Pulse: 63 90  Resp: 19 17  Temp: 36.5 C 36.5 C  SpO2: 95% 95%    Last Pain:  Vitals:   05/04/21 2028  TempSrc: Oral  PainSc:                  Effie Berkshire

## 2021-05-04 NOTE — Progress Notes (Signed)
Progress Note  Patient Name: Tara Bradley Date of Encounter: 05/04/2021  Primary Cardiologist:   [name], MD   Subjective   Denies any dyspnea or chest pain.  BP 145/82 this morning  Inpatient Medications    Scheduled Meds:  digoxin  0.0625 mg Oral Daily   insulin aspart  0-6 Units Subcutaneous TID WC   insulin detemir  5 Units Subcutaneous Daily   mupirocin ointment  1 application Nasal BID   polyethylene glycol  17 g Oral Daily   senna-docusate  1 tablet Oral BID   sodium chloride flush  3 mL Intravenous Q12H   Continuous Infusions:   ceFAZolin (ANCEF) IV     diltiazem (CARDIZEM) infusion 12.5 mg/hr (05/04/21 0409)   heparin 1,000 Units/hr (05/03/21 1703)   tranexamic acid     PRN Meds: acetaminophen **OR** acetaminophen, albuterol, bisacodyl, HYDROcodone-acetaminophen, morphine injection, ondansetron **OR** ondansetron (ZOFRAN) IV, traZODone   Vital Signs    Vitals:   05/04/21 0038 05/04/21 0410 05/04/21 0450 05/04/21 0852  BP: 132/90 (!) 145/82    Pulse: 64 (!) 119  (!) 103  Resp: 19 19    Temp: 98.1 F (36.7 C) 98.5 F (36.9 C)    TempSrc: Oral Oral    SpO2: 91% 90%    Weight:   65.5 kg   Height:        Intake/Output Summary (Last 24 hours) at 05/04/2021 0928 Last data filed at 05/04/2021 0041 Gross per 24 hour  Intake 360 ml  Output 600 ml  Net -240 ml    Filed Weights   05/02/21 2317 05/03/21 0300 05/04/21 0450  Weight: 66.5 kg 67 kg 65.5 kg    Telemetry    Atrial fib with rates 90-120s - Personally Reviewed  ECG    NA - Personally Reviewed  Physical Exam   GEN: No acute distress.   Neck: + JVD Cardiac: Irregular RR, no murmurs, rubs, or gallops.  Respiratory: Clear  to auscultation bilaterally. GI: Soft, nontender, non-distended  MS: No  edema; No deformity. Neuro:  Nonfocal  Psych: Normal affect   Labs    Chemistry Recent Labs  Lab 05/02/21 0506 05/03/21 0340 05/04/21 0105  NA 136 132* 133*  K 3.2* 4.2  4.1  CL 97* 100 100  CO2 30 23 23   GLUCOSE 228* 190* 154*  BUN 24* 17 17  CREATININE 0.90 0.68 0.70  CALCIUM 9.5 7.9* 8.2*  GFRNONAA 59* >60 >60  ANIONGAP 9 9 10       Hematology Recent Labs  Lab 05/02/21 0506 05/03/21 0340 05/04/21 0105  WBC 9.0 6.4 6.6  RBC 4.48 3.59* 3.44*  HGB 14.2 11.6* 11.2*  HCT 42.8 34.7* 33.3*  MCV 95.5 96.7 96.8  MCH 31.7 32.3 32.6  MCHC 33.2 33.4 33.6  RDW 13.2 13.6 13.9  PLT 190 157 153     Cardiac EnzymesNo results for input(s): TROPONINI in the last 168 hours. No results for input(s): TROPIPOC in the last 168 hours.   BNPNo results for input(s): BNP, PROBNP in the last 168 hours.   DDimer No results for input(s): DDIMER in the last 168 hours.   Radiology    CT PELVIS WO CONTRAST  Result Date: 05/03/2021 CLINICAL DATA:  Pelvic fracture. EXAM: CT PELVIS WITHOUT CONTRAST TECHNIQUE: Multidetector CT imaging of the pelvis was performed following the standard protocol without intravenous contrast. COMPARISON:  CT abdomen pelvis dated 09/28/2016. FINDINGS: Evaluation of this exam is limited in the absence of intravenous contrast. Urinary  Tract:  No abnormality visualized. Bowel: Large amount of stool throughout the colon. No bowel dilatation or inflammation. The appendix is normal. Vascular/Lymphatic: Advanced aortoiliac atherosclerotic disease. The IVC is unremarkable no adenopathy. Reproductive:  Hysterectomy. Other:  Partially visualized layering stones within the gallbladder. Musculoskeletal: Severe osteopenia. Old healed bilateral pubic bone fractures. Partially visualized right femoral intramedullary nail. The visualized hardware appears intact. No acute osseous pathology. IMPRESSION: 1. No acute osseous pathology. Old healed bilateral pubic bone fractures. 2. Large amount of stool throughout the colon. 3. Cholelithiasis. 4. Aortic Atherosclerosis (ICD10-I70.0). Electronically Signed   By: Anner Crete M.D.   On: 05/03/2021 01:19    Cardiac  Studies   None   Patient Profile     87 y.o. female with a hx of with a history of an aortic thrombosed descending aortic dissection, aortic valve sclerosis and chronic afib on Xarelto who is being seefor the evaluation of atrial fib  a    Assessment & Plan    ATRIAL FIB:     Her rate has been relatively controlled despite the pain.  Continue IV Cardizem through surgery.  Heparin ordered.  Xarelto on hold.  Holding beta blocker currently with borderline BP.  Will be resuming this and PO Cardizem post op.   Hold Hyzaar for now.     AORTIC DISSECTION:   Chronic and not an issue.     CHRONIC DIASTOLIC HEART FAILURE:   She seems to be euvolemic.  Will closely monitor volume status postoperatively  PREOP:  The patient is at acceptable risk for the necessary shoulder surgery.  The most significant risk is her advanced age.  We can follow her rate which will be labile and we will need to follow her volume status closely.    For questions or updates, please contact [place] Please consult [email or web address] for contact info under Cardiology/STEMI.   Signed, Donato Heinz, MD  05/04/2021, 9:28 AM

## 2021-05-04 NOTE — Anesthesia Procedure Notes (Signed)
Procedure Name: Intubation Date/Time: 05/04/2021 4:42 PM Performed by: Oletta Lamas, CRNA Pre-anesthesia Checklist: Patient identified, Emergency Drugs available, Suction available and Patient being monitored Patient Re-evaluated:Patient Re-evaluated prior to induction Oxygen Delivery Method: Circle System Utilized Preoxygenation: Pre-oxygenation with 100% oxygen Induction Type: IV induction Ventilation: Mask ventilation without difficulty Laryngoscope Size: Mac and 3 Grade View: Grade I Tube type: Oral Number of attempts: 1 Airway Equipment and Method: Stylet and Oral airway Placement Confirmation: ETT inserted through vocal cords under direct vision, positive ETCO2 and breath sounds checked- equal and bilateral Secured at: 22 cm Tube secured with: Tape Dental Injury: Teeth and Oropharynx as per pre-operative assessment
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
## 2021-05-04 NOTE — Progress Notes (Signed)
Mobility Specialist Progress Note    05/04/21 1239  Mobility  Activity Ambulated in hall  Level of Assistance +2 (takes two people)  Assistive Device  (HHA)  Distance Ambulated (ft) 200 ft  Mobility Ambulated with assistance in hallway  Mobility Response Tolerated fair  Mobility performed by Mobility specialist;Nurse tech  $Mobility charge 1 Mobility   Pt received in chair and agreeable. C/o shoulder pain. Felt some SOB towards end of walk. Ambulated on RA. Returned to use [facility] with successful void then bed. Left with call bell in reach.   [facility] Mobility Specialist  M.S. Primary Phone: [phone] M.S. Secondary Phone: [phone]

## 2021-05-04 NOTE — Brief Op Note (Signed)
05/04/2021  7:11 PM  PATIENT:  Tara Bradley  87 y.o. female  PRE-OPERATIVE DIAGNOSIS:  Right Proximal Humerus Fracture, comminuted and displaced  POST-OPERATIVE DIAGNOSIS:  Right Proximal Humerus Fracture, comminuted and displaced  PROCEDURE:  Procedure(s): REVERSE SHOULDER ARTHROPLASTY (Right) Biomet with tuberosity repair  SURGEON:  Surgeon(s) and Role:    Netta Cedars, MD - Primary  PHYSICIAN ASSISTANT:   ASSISTANTS: Ventura Bruns, PA-C   ANESTHESIA:   regional and general  EBL:  200 mL   BLOOD ADMINISTERED:none  DRAINS: none   LOCAL MEDICATIONS USED:  MARCAINE     SPECIMEN:  No Specimen  DISPOSITION OF SPECIMEN:  N/A  COUNTS:  YES  TOURNIQUET:  * No tourniquets in log *  DICTATION: .Other Dictation: Dictation Number [id number]  PLAN OF CARE: Admit to inpatient   PATIENT DISPOSITION:  PACU - hemodynamically stable.   Delay start of Pharmacological VTE agent (>24hrs) due to surgical blood loss or risk of bleeding: yes

## 2021-05-04 NOTE — Anesthesia Procedure Notes (Signed)
Anesthesia Regional Block: Interscalene brachial plexus block   Pre-Anesthetic Checklist: , timeout performed,  Correct Patient, Correct Site, Correct Laterality,  Correct Procedure, Correct Position, site marked,  Risks and benefits discussed,  Surgical consent,  Pre-op evaluation,  At surgeon's request and post-op pain management  Laterality: Right and Upper  Prep: chloraprep       Needles:  Injection technique: Single-shot  Needle Type: Echogenic Needle     Needle Length: 9cm  Needle Gauge: 21     Additional Needles:   Procedures:,,,, ultrasound used (permanent image in chart),,    Narrative:  Start time: 05/04/2021 4:07 PM End time: 05/04/2021 4:14 PM Injection made incrementally with aspirations every 5 mL.  Performed by: Personally  Anesthesiologist: Annye Asa, MD  Additional Notes: Pt identified in Holding room.  Monitors applied. Working IV access confirmed. Sterile prep R neck and clavicle.  #21ga ECHOgenic Arrow block needle to interscalene brachial plexus with US guidance.  10cc 0.5% Bupivacaine with exparel injected incrementally after negative test dose.  Patient asymptomatic, VSS, no heme aspirated, tolerated well.

## 2021-05-04 NOTE — TOC CAGE-AID Note (Signed)
Transition of Care [facility]) - CAGE-AID Screening   Patient Details  Name: Tara Bradley MRN: [id number] Date of Birth: 1923/01/29  [facility]) CM/SW Contact:    Bethann Berkshire, [place] Phone Number: 05/04/2021, 10:25 AM   Clinical Narrative:  CAGE-AID completed; score of 0. Pt states she does not drink alcohol or use any other substances.   CAGE-AID Screening:    Have You Ever Felt You Ought to Cut Down on Your Drinking or Drug Use?: No Have People Annoyed You By Critizing Your Drinking Or Drug Use?: No Have You Felt Bad Or Guilty About Your Drinking Or Drug Use?: No Have You Ever Had a Drink or Used Drugs First Thing In The Morning to Steady Your Nerves or to Get Rid of a Hangover?: No CAGE-AID Score: 0  Substance Abuse Education Offered: No

## 2021-05-05 ENCOUNTER — Encounter (HOSPITAL_COMMUNITY): Payer: Self-pay | Admitting: Orthopedic Surgery

## 2021-05-05 LAB — BASIC METABOLIC PANEL
Anion gap: 11 (ref 5–15)
BUN: 11 mg/dL (ref 8–23)
CO2: 22 mmol/L (ref 22–32)
Calcium: 8.4 mg/dL — ABNORMAL LOW (ref 8.9–10.3)
Chloride: 101 mmol/L (ref 98–111)
Creatinine, Ser: 0.83 mg/dL (ref 0.44–1.00)
GFR, Estimated: >60 mL/min (ref >60)
Glucose, Bld: 313 mg/dL — ABNORMAL HIGH (ref 70–99)
Potassium: 5 mmol/L (ref 3.5–5.1)
Sodium: 134 mmol/L — ABNORMAL LOW (ref 135–145)

## 2021-05-05 LAB — GLUCOSE, CAPILLARY
Glucose-Capillary: 272 mg/dL — ABNORMAL HIGH (ref 70–99)
Glucose-Capillary: 283 mg/dL — ABNORMAL HIGH (ref 70–99)
Glucose-Capillary: 418 mg/dL — ABNORMAL HIGH (ref 70–99)
Glucose-Capillary: 322 mg/dL — ABNORMAL HIGH (ref 70–99)
Glucose-Capillary: 264 mg/dL — ABNORMAL HIGH (ref 70–99)

## 2021-05-05 LAB — CBC
HCT: 34.8 % — ABNORMAL LOW (ref 36.0–46.0)
Hemoglobin: 11.4 g/dL — ABNORMAL LOW (ref 12.0–15.0)
MCH: 31.8 pg (ref 26.0–34.0)
MCHC: 32.8 g/dL (ref 30.0–36.0)
MCV: 97.2 fL (ref 80.0–100.0)
Platelets: 170 10*3/uL (ref 150–400)
RBC: 3.58 MIL/uL — ABNORMAL LOW (ref 3.87–5.11)
RDW: 13.7 % (ref 11.5–15.5)
WBC: 9.7 10*3/uL (ref 4.0–10.5)
nRBC: 0 % (ref 0.0–0.2)

## 2021-05-05 MED ORDER — RIVAROXABAN 15 MG PO TABS
15.0000 mg | ORAL_TABLET | Freq: Every day | ORAL | Status: DC
  Administered 2021-05-05 – 2021-05-11 (×7): 15 mg via ORAL
  Filled 2021-05-05 (×7): qty 1

## 2021-05-05 MED ORDER — DILTIAZEM HCL 60 MG PO TABS
90.0000 mg | ORAL_TABLET | Freq: Four times a day (QID) | ORAL | Status: DC
  Administered 2021-05-05 – 2021-05-06 (×4): 90 mg via ORAL
  Filled 2021-05-05 (×4): qty 2

## 2021-05-05 NOTE — Progress Notes (Signed)
Physical Therapy Treatment Patient Details Name: Tara Bradley MRN: [id number] DOB: December 10, 1922 Today's Date: 05/05/2021   History of Present Illness This 87 y.o. female admitted 05/02/21 after sustaining a fall.  She does endorse hitting her head, and was found to have severely displaced proximal right humeral neck fracture of indeterminate age along with old displaced acromial fracture.   She was also found to have A-Fib with RVR with rates in 150s and hypoxia with ambulation requiring 2L 02. s/p 05/04/21 R total reverse arthroplasty. PMH includes: arthritis, aortic valve stenosis, bradycardia, DM, HTN, A-Fib, syncope, ho ulnar fx    PT Comments    NT in room assisting pt with cleaning up after Purewick leak. Pt reports surgery went well and that her nerve block is still in place and she does not feel anything in her R UE. Pt limited in safe mobility by elevated HR and 3/4 DoE with short distance ambulation, in presence of decreased balance secondary to NWB through R UE. Pt is min Ax2  for bed mobility, transfers and ambulation without AD. Pt reports that she can have a friend stay with her until her daughter arrives from [place], in which case PT recommends HHPT level rehab. PT will continue to follow acutely.    Recommendations for follow up therapy are one component of a multi-disciplinary discharge planning process, led by the attending physician.  Recommendations may be updated based on patient status, additional functional criteria and insurance authorization.  Follow Up Recommendations  Home health PT     Assistance Recommended at Discharge Other (comment) (pt reports she has a CNA friend that can stay with her until her daughter can come in from [place])  Equipment Recommendations  Other (comment) (TBA)       Precautions / Restrictions Precautions Precautions: Fall Precaution Comments: displaced R humerus fx; monitor HR Required Braces or Orthoses: Sling Restrictions Weight  Bearing Restrictions: Yes RUE Weight Bearing: Non weight bearing Other Position/Activity Restrictions: can use R UE to guide RW not bear weight     Mobility  Bed Mobility Overal bed mobility: Needs Assistance Bed Mobility: Supine to Sit     Supine to sit: Min assist;HOB elevated;+2 for physical assistance     General bed mobility comments: assist to scoot hips and for R UE support    Transfers Overall transfer level: Needs assistance Equipment used: 1 person hand held assist;2 person hand held assist Transfers: Sit to/from Stand Sit to Stand: Min assist;From elevated surface           General transfer comment: min Ax2 for power up and steadying at [facility]    Ambulation/Gait Ambulation/Gait assistance: Min assist Gait Distance (Feet): 20 Feet Assistive device: 1 person hand held assist;IV Pole Gait Pattern/deviations: Step-through pattern;Decreased stride length;Shuffle Gait velocity: reduced Gait velocity interpretation: <1.31 ft/sec, indicative of household ambulator   General Gait Details: HR in 140's with ambulation, with increased dyspneia. HHA on L and support around waist         Balance Overall balance assessment: Needs assistance Sitting-balance support: Feet supported;Single extremity supported;No upper extremity supported Sitting balance-Leahy Scale: Good Sitting balance - Comments: assist for changing gown and repositioning sling in sitting   Standing balance support: Single extremity supported Standing balance-Leahy Scale: Poor Standing balance comment: Reliant on at least L UE support to stand for pericare  Cognition Arousal/Alertness: Awake/alert Behavior During Therapy: WFL for tasks assessed/performed Overall Cognitive Status: Within Functional Limits for tasks assessed                                             General Comments General comments (skin integrity, edema, etc.): SpO2 on 2L  O2 via [place] 96%O2, trialed RA and SpO2 dropped to 87%O2, replaced 2L O2 via [place] and able to maintain SpO2 90%O2 with ambulation, max noted HR with mobility 144bpm, elevated in 120s with all activity in standing      Pertinent Vitals/Pain Pain Assessment: No/denies pain (reports nerve block still in place)     PT Goals (current goals can now be found in the care plan section) Acute Rehab PT Goals PT Goal Formulation: With patient Time For Goal Achievement: 05/17/21 Potential to Achieve Goals: Good Progress towards PT goals: Progressing toward goals    Frequency    Min 3X/week      PT Plan Discharge plan needs to be updated       AM-PAC PT "6 Clicks" Mobility   Outcome Measure  Help needed turning from your back to your side while in a flat bed without using bedrails?: A Little Help needed moving from lying on your back to sitting on the side of a flat bed without using bedrails?: A Little Help needed moving to and from a bed to a chair (including a wheelchair)?: A Little Help needed standing up from a chair using your arms (e.g., wheelchair or bedside chair)?: A Little Help needed to walk in hospital room?: A Little Help needed climbing 3-5 steps with a railing? : A Lot 6 Click Score: 17    End of Session Equipment Utilized During Treatment: Other (comment) (sling) Activity Tolerance: Treatment limited secondary to medical complications (Comment) (increased HR and SoB with mobility) Patient left: in chair;with call bell/phone within reach;with chair alarm set;with nursing/sitter in room Nurse Communication: Mobility status PT Visit Diagnosis: Other abnormalities of gait and mobility (R26.89);History of falling (Z91.81);Difficulty in walking, not elsewhere classified (R26.2);Pain Pain - Right/Left: Right Pain - part of body: Arm     Time: 0832-0909 PT Time Calculation (min) (ACUTE ONLY): 37 min  Charges:  $Therapeutic Activity: 23-37 mins                     Lakendra Helling  B. Migdalia Dk PT, DPT Acute Rehabilitation Services Pager [phone] Office [phone]    [place] 05/05/2021, 11:06 AM

## 2021-05-05 NOTE — Progress Notes (Signed)
PROGRESS NOTE  Tara Bradley  ERX:[id number] DOB: May 01, 1923 DOA: 05/02/2021 PCP: [name], MD   Brief Narrative: Tara Bradley is a 87 y.o. female with a history of chronic AFib on xarelto, HTN, HLD, and insomnia who presented to the ED 12/10 with severe right shoulder pain after a fall at home at night after taking her first dose restarting temazepam for insomnia. She's had intermittent elevated heart rates at home and was confirmed to have rapid atrial fibrillation in the ED up to 150's bpm. BP was soft, and oxygen saturation was marginal for which 2L O2 was applied. XR confirmed a severely displaced fracture of the right humerus with old acromial fracture. X-ray of the chest, pelvis, CT head, and CT cervical spine showed no acute abnormality. Orthopedics recommended operative management after rate control and xarelto washout period. Diltiazem bolus and infusion as well as heparin infusion were started on admission with cardiology consulting. Dr. Veverly Fells took her for reverse shoulder arthroplasty 12/12.   Assessment & Plan: Principal Problem:   Right humeral fracture Active Problems:   DM type 2, controlled, with complication (HCC)   Persistent atrial fibrillation with rapid ventricular response (HCC)   Osteoporosis   Chronic anticoagulation   Hyperlipidemia   Chronic insomnia   Chronic congestive heart failure with left ventricular diastolic dysfunction (HCC)   Chronic diastolic heart failure ([place])   Fall at home, initial encounter   Atrial fibrillation with RVR ([place])   S/P shoulder replacement, right  Fall at home due to benzodiazepine dose:  - Avoid sedating medications, fall precautions, PT evaluation postoperatively >> recommended SNF which seems appropriate, but pt prefers to return home. TOC aware.  Traumatic acute displaced, closed right humerus fracture: s/p reverse shoulder arthroplasty 12/12 by Dr. Veverly Fells.  - Nerve block still in effect, will continue alternative  pain medications prn.  - Ok to attempt self care tasks with right arm, but no load bearing recommended by ortho. Anticipates short term SNF at discharge.   Chronic atrial fibrillation with intermittent RVR: TSH 2.569.  - Continue digoxin, level is 0.7.  - Convert IV diltiazem to po, add back beta blocker if rates uncontrolled per cardiology.  - CHA2DS2-VASc score (6). Ok to restart DOAC this evening per surgery.  Chronic HFpEF, HTN: Got IVF w/surgery, now slightly volume up.  - Restart po diuretic. - Holding losartan, HCTZ with soft BP. - Continue monitoring I/O, weights.   T2DM: HbA1c 8.6% indicating poor control, even with liberalized goal for age.  - Holding metformin.  - Home humalog 50/50 mix 5u daily changed to levemir 5u daily+ SSI, at inpatient goal until after surgery. Looks like she was given decadron 4mg . To avoid resultant hypotension as this washes out, we will continue monitoring with sliding scale coverage.  Hypokalemia:  - Supplemented, at goal >4.   Osteoporosis: Vitamin D level is 64.  Aortic dissection: Chronic, not an acute issue.  Insomnia:  - Trial trazodone lowest dose qHS prn  DVT prophylaxis: DOAC this evening Code Status: Full Family Communication: None at bedside Disposition Plan:  Status is: Inpatient  Remains inpatient appropriate because: Postoperative pain control, rate control, monitor diuresis.  Consultants:  Cardiology Orthopedics  Procedures:  Reverse right shoulder arthroplasty 05/04/2021 Dr. Veverly Fells  Antimicrobials: None   Subjective: Blood sugar up today, though was checked postprandially. Having significant dyspnea on exertion worse today. Rates still moderately controlled. No pain in arm, still mostly numb.  Objective: Vitals:   05/04/21 2311 05/05/21 0530 05/05/21 9509  05/05/21 1250  BP: 140/88 133/74  (!) 128/59  Pulse: 97 100 99   Resp: 18 17    Temp: 97.8 F (36.6 C) 97.8 F (36.6 C)    TempSrc: Oral Oral    SpO2:  96% 94%    Weight:  66 kg    Height:        Intake/Output Summary (Last 24 hours) at 05/05/2021 1344 Last data filed at 05/05/2021 0831 Gross per 24 hour  Intake 2378.29 ml  Output 200 ml  Net 2178.29 ml   Filed Weights   05/03/21 0300 05/04/21 0450 05/05/21 0530  Weight: 67 kg 65.5 kg 66 kg   Gen: Pleasant, elderly female in no distress Pulm: Nonlabored breathing room air. Clear. CV: Rapid irregular. No murmur, rub, or gallop. + sitting upright JVD, no dependent edema. GI: Abdomen soft, non-tender, non-distended, with normoactive bowel sounds.  Ext: Warm, no deformities Skin: No new rashes, lesions or ulcers on visualized skin. Right shoulder dressing is c/d/i Neuro: Alert and oriented. RUE diminished sensation. No other focal neurological deficits. Psych: Judgement and insight appear fair. Mood euthymic & affect congruent. Behavior is appropriate.    Data Reviewed: I have personally reviewed following labs and imaging studies  CBC: Recent Labs  Lab 05/02/21 0506 05/03/21 0340 05/04/21 0105 05/05/21 0413  WBC 9.0 6.4 6.6 9.7  NEUTROABS 6.4  --   --   --   HGB 14.2 11.6* 11.2* 11.4*  HCT 42.8 34.7* 33.3* 34.8*  MCV 95.5 96.7 96.8 97.2  PLT 190 157 153 212   Basic Metabolic Panel: Recent Labs  Lab 05/02/21 0506 05/03/21 0340 05/04/21 0105 05/05/21 0413  NA 136 132* 133* 134*  K 3.2* 4.2 4.1 5.0  CL 97* 100 100 101  CO2 30 23 23 22   GLUCOSE 228* 190* 154* 313*  BUN 24* 17 17 11   CREATININE 0.90 0.68 0.70 0.83  CALCIUM 9.5 7.9* 8.2* 8.4*  MG 1.9  --   --   --    GFR: Estimated Creatinine Clearance: 36.2 mL/min (by C-G formula based on SCr of 0.83 mg/dL). Liver Function Tests: No results for input(s): AST, ALT, ALKPHOS, BILITOT, PROT, ALBUMIN in the last 168 hours. No results for input(s): LIPASE, AMYLASE in the last 168 hours. No results for input(s): AMMONIA in the last 168 hours. Coagulation Profile: No results for input(s): INR, PROTIME in the last 168  hours. Cardiac Enzymes: No results for input(s): CKTOTAL, CKMB, CKMBINDEX, TROPONINI in the last 168 hours. BNP (last 3 results) No results for input(s): PROBNP in the last 8760 hours. HbA1C: Recent Labs    05/03/21 0340  HGBA1C 8.6*   CBG: Recent Labs  Lab 05/04/21 1913 05/04/21 2217 05/05/21 0659 05/05/21 0750 05/05/21 1223  GLUCAP 110* 215* 272* 283* 418*   Lipid Profile: No results for input(s): CHOL, HDL, LDLCALC, TRIG, CHOLHDL, LDLDIRECT in the last 72 hours. Thyroid Function Tests: Recent Labs    05/04/21 0105  TSH 2.569   Anemia Panel: No results for input(s): VITAMINB12, FOLATE, FERRITIN, TIBC, IRON, RETICCTPCT in the last 72 hours. Urine analysis:    Component Value Date/Time   COLORURINE YELLOW 05/03/2021 0645   APPEARANCEUR CLEAR 05/03/2021 0645   LABSPEC 1.021 05/03/2021 0645   PHURINE 6.0 05/03/2021 0645   GLUCOSEU 150 (A) 05/03/2021 0645   HGBUR NEGATIVE 05/03/2021 0645   BILIRUBINUR NEGATIVE 05/03/2021 0645   BILIRUBINUR n 07/23/2013 1343   KETONESUR 5 (A) 05/03/2021 0645   PROTEINUR NEGATIVE 05/03/2021 0645  UROBILINOGEN 0.2 07/23/2013 1343   UROBILINOGEN 0.2 03/07/2012 2123   NITRITE NEGATIVE 05/03/2021 0645   LEUKOCYTESUR NEGATIVE 05/03/2021 0645   Recent Results (from the past 240 hour(s))  Resp Panel by RT-PCR (Flu A&B, Covid) Nasopharyngeal Swab     Status: None   Collection Time: 05/02/21  8:49 AM   Specimen: Nasopharyngeal Swab; Nasopharyngeal(NP) swabs in vial transport medium  Result Value Ref Range Status   SARS Coronavirus 2 by RT PCR NEGATIVE NEGATIVE Final    Comment: (NOTE) SARS-CoV-2 target nucleic acids are NOT DETECTED.  The SARS-CoV-2 RNA is generally detectable in upper respiratory specimens during the acute phase of infection. The lowest concentration of SARS-CoV-2 viral copies this assay can detect is 138 copies/mL. A negative result does not preclude SARS-Cov-2 infection and should not be used as the sole basis for  treatment or other patient management decisions. A negative result may occur with  improper specimen collection/handling, submission of specimen other than nasopharyngeal swab, presence of viral mutation(s) within the areas targeted by this assay, and inadequate number of viral copies(<138 copies/mL). A negative result must be combined with clinical observations, patient history, and epidemiological information. The expected result is Negative.  Fact Sheet for Patients:  EntrepreneurPulse.com.au  Fact Sheet for Healthcare Providers:  IncredibleEmployment.be  This test is no t yet approved or cleared by the [place] FDA and  has been authorized for detection and/or diagnosis of SARS-CoV-2 by FDA under an Emergency Use Authorization (EUA). This EUA will remain  in effect (meaning this test can be used) for the duration of the COVID-19 declaration under Section 564(b)(1) of the Act, 21 U.S.C.section 360bbb-3(b)(1), unless the authorization is terminated  or revoked sooner.       Influenza A by PCR NEGATIVE NEGATIVE Final   Influenza B by PCR NEGATIVE NEGATIVE Final    Comment: (NOTE) The Xpert Xpress SARS-CoV-2/FLU/RSV plus assay is intended as an aid in the diagnosis of influenza from Nasopharyngeal swab specimens and should not be used as a sole basis for treatment. Nasal washings and aspirates are unacceptable for Xpert Xpress SARS-CoV-2/FLU/RSV testing.  Fact Sheet for Patients: EntrepreneurPulse.com.au  Fact Sheet for Healthcare Providers: IncredibleEmployment.be  This test is not yet approved or cleared by the [place] FDA and has been authorized for detection and/or diagnosis of SARS-CoV-2 by FDA under an Emergency Use Authorization (EUA). This EUA will remain in effect (meaning this test can be used) for the duration of the COVID-19 declaration under Section 564(b)(1) of the Act, 21  U.S.C. section 360bbb-3(b)(1), unless the authorization is terminated or revoked.  Performed at [facility], [place] [address], [place], [name] [address]   Surgical PCR screen     Status: None   Collection Time: 05/04/21  8:28 AM   Specimen: Nasal Mucosa; Nasal Swab  Result Value Ref Range Status   MRSA, PCR NEGATIVE NEGATIVE Final   Staphylococcus aureus NEGATIVE NEGATIVE Final    Comment: (NOTE) The Xpert SA Assay (FDA approved for NASAL specimens in patients 55 years of age and older), is one component of a comprehensive surveillance program. It is not intended to diagnose infection nor to guide or monitor treatment. Performed at [facility], [place] [address], [place], [place] [address]       Radiology Studies: DG Shoulder Right Port  Result Date: 05/04/2021 CLINICAL DATA:  Shoulder replacements EXAM: PORTABLE RIGHT SHOULDER COMPARISON:  05/02/2021, 03/10/2017 FINDINGS: Interval right shoulder replacement with normal alignment. Residual displaced acromial fracture  fragment is noted. Old right-sided rib fractures. IMPRESSION: 1. Interval right shoulder replacement with normal alignment 2. Old acromial fracture fragment redemonstrated. Electronically Signed   By: Donavan Foil M.D.   On: 05/04/2021 21:25   DG MINI C-ARM IMAGE ONLY  Result Date: 05/04/2021 There is no interpretation for this exam.  This order is for images obtained during a surgical procedure.  Please See "Surgeries" Tab for more information regarding the procedure.    Scheduled Meds:  digoxin  0.0625 mg Oral Daily   diltiazem  90 mg Oral Q6H   docusate sodium  100 mg Oral BID   furosemide  40 mg Oral Daily   insulin aspart  0-6 Units Subcutaneous TID WC   insulin detemir  5 Units Subcutaneous Daily   mupirocin ointment  1 application Nasal BID   polyethylene glycol  17 g Oral Daily   potassium chloride  10 mEq Oral Daily   rivaroxaban  15 mg Oral Q supper   senna-docusate  1 tablet Oral BID    sodium chloride flush  3 mL Intravenous Q12H   Continuous Infusions:  sodium chloride 10 mL/hr at 05/05/21 0321     LOS: 3 days   Time spent: 25 minutes.  Patrecia Pour, MD [facility] [email or web address] 05/05/2021, 1:44 PM

## 2021-05-05 NOTE — Progress Notes (Signed)
Orthopedics Progress Note  Subjective: Shoulder still  numb from the block, otherwise doing well  Objective:  Vitals:   05/04/21 2311 05/05/21 0530  BP: 140/88 133/74  Pulse: 97 100  Resp: 18 17  Temp: 97.8 F (36.6 C) 97.8 F (36.6 C)  SpO2: 96% 94%    General: Awake and alert  Musculoskeletal: Right shoulder dressing CDI Neurovascularly intact  Lab Results  Component Value Date   WBC 9.7 05/05/2021   HGB 11.4 (L) 05/05/2021   HCT 34.8 (L) 05/05/2021   MCV 97.2 05/05/2021   PLT 170 05/05/2021       Component Value Date/Time   NA 134 (L) 05/05/2021 0413   NA 139 04/06/2019 1235   K 5.0 05/05/2021 0413   CL 101 05/05/2021 0413   CO2 22 05/05/2021 0413   GLUCOSE 313 (H) 05/05/2021 0413   BUN 11 05/05/2021 0413   BUN 17 04/06/2019 1235   CREATININE 0.83 05/05/2021 0413   CREATININE 0.71 12/31/2015 1504   CALCIUM 8.4 (L) 05/05/2021 0413   GFRNONAA >60 05/05/2021 0413   GFRAA >60 10/19/2019 1640    Lab Results  Component Value Date   INR 1.11 03/11/2017   INR 1.09 03/11/2017   INR 1.16 03/10/2017    Assessment/Plan: POD #1 s/p Procedure(s): REVERSE SHOULDER ARTHROPLASTY Patient doing well following shoulder surgery for displaced proximal humerus fracture that was not amenable for ORIF Will start therapy for the shoulder this AM. Limited ADLs and self care ok with the right arm but no load bearing due to tuberosity repair. Anticipate likely needing short term SNF Restart Xarelto this evening. Would hold heparin if possible today.  Thank you!  Doran Heater. Veverly Fells, MD 05/05/2021 7:43 AM

## 2021-05-05 NOTE — Progress Notes (Signed)
Mobility Specialist Progress Note    05/05/21 1544  Mobility  Activity Ambulated to bathroom  Level of Assistance Moderate assist, patient does 50-74%  Assistive Device  (HHA)  RUE Weight Bearing NWB  Distance Ambulated (ft) 25 ft  Mobility Out of bed for toileting  Mobility Response Tolerated fair  Mobility performed by Mobility specialist  $Mobility charge 1 Mobility   Pt received in bed and agreeable to go to BR. C/o feeling weak. After BR, pt went to chair while NT and I performed some pericare and changed her bed. Pt returned to bed. Left in room with NT present.   Pt stated some concern about going home with just her friend to care for her until her daughter arrives.   [facility] Mobility Specialist  M.S. Primary Phone: [phone] M.S. Secondary Phone: [phone]

## 2021-05-05 NOTE — Progress Notes (Signed)
Progress Note  Patient Name: Tara Bradley Date of Encounter: 05/05/2021  Primary Cardiologist:   [name], MD   Subjective   BP 133/74.  Denies chest pain or dyspnea  Inpatient Medications    Scheduled Meds:  digoxin  0.0625 mg Oral Daily   docusate sodium  100 mg Oral BID   furosemide  40 mg Oral Daily   insulin aspart  0-6 Units Subcutaneous TID WC   insulin detemir  5 Units Subcutaneous Daily   mupirocin ointment  1 application Nasal BID   polyethylene glycol  17 g Oral Daily   potassium chloride  10 mEq Oral Daily   rivaroxaban  15 mg Oral Q supper   senna-docusate  1 tablet Oral BID   sodium chloride flush  3 mL Intravenous Q12H   Continuous Infusions:  sodium chloride 10 mL/hr at 05/05/21 0321    ceFAZolin (ANCEF) IV 2 g (05/05/21 0655)   diltiazem (CARDIZEM) infusion 12.5 mg/hr (05/05/21 0321)   PRN Meds: sodium chloride, acetaminophen **OR** acetaminophen, albuterol, bisacodyl, HYDROcodone-acetaminophen, menthol-cetylpyridinium **OR** phenol, metoCLOPramide **OR** metoCLOPramide (REGLAN) injection, morphine injection, ondansetron **OR** ondansetron (ZOFRAN) IV, polyethylene glycol, traZODone   Vital Signs    Vitals:   05/04/21 2028 05/04/21 2311 05/05/21 0530 05/05/21 0847  BP: 132/84 140/88 133/74   Pulse: 90 97 100 99  Resp: 17 18 17    Temp: 97.7 F (36.5 C) 97.8 F (36.6 C) 97.8 F (36.6 C)   TempSrc: Oral Oral Oral   SpO2: 95% 96% 94%   Weight:   66 kg   Height:        Intake/Output Summary (Last 24 hours) at 05/05/2021 1030 Last data filed at 05/05/2021 0831 Gross per 24 hour  Intake 2378.29 ml  Output 400 ml  Net 1978.29 ml    Filed Weights   05/03/21 0300 05/04/21 0450 05/05/21 0530  Weight: 67 kg 65.5 kg 66 kg    Telemetry    Atrial fib with rates 80-120s - Personally Reviewed  ECG    NA - Personally Reviewed  Physical Exam   GEN: No acute distress.   Neck: + JVD Cardiac: Irregular RR, no murmurs, rubs, or  gallops.  Respiratory: Clear  to auscultation bilaterally. GI: Soft, nontender, non-distended  MS: No  edema; No deformity. Neuro:  Nonfocal  Psych: Normal affect   Labs    Chemistry Recent Labs  Lab 05/03/21 0340 05/04/21 0105 05/05/21 0413  NA 132* 133* 134*  K 4.2 4.1 5.0  CL 100 100 101  CO2 23 23 22   GLUCOSE 190* 154* 313*  BUN 17 17 11   CREATININE 0.68 0.70 0.83  CALCIUM 7.9* 8.2* 8.4*  GFRNONAA >60 >60 >60  ANIONGAP 9 10 11       Hematology Recent Labs  Lab 05/03/21 0340 05/04/21 0105 05/05/21 0413  WBC 6.4 6.6 9.7  RBC 3.59* 3.44* 3.58*  HGB 11.6* 11.2* 11.4*  HCT 34.7* 33.3* 34.8*  MCV 96.7 96.8 97.2  MCH 32.3 32.6 31.8  MCHC 33.4 33.6 32.8  RDW 13.6 13.9 13.7  PLT 157 153 170     Cardiac EnzymesNo results for input(s): TROPONINI in the last 168 hours. No results for input(s): TROPIPOC in the last 168 hours.   BNPNo results for input(s): BNP, PROBNP in the last 168 hours.   DDimer No results for input(s): DDIMER in the last 168 hours.   Radiology    DG Shoulder Right Port  Result Date: 05/04/2021 CLINICAL DATA:  Shoulder replacements  EXAM: PORTABLE RIGHT SHOULDER COMPARISON:  05/02/2021, 03/10/2017 FINDINGS: Interval right shoulder replacement with normal alignment. Residual displaced acromial fracture fragment is noted. Old right-sided rib fractures. IMPRESSION: 1. Interval right shoulder replacement with normal alignment 2. Old acromial fracture fragment redemonstrated. Electronically Signed   By: Donavan Foil M.D.   On: 05/04/2021 21:25   DG MINI C-ARM IMAGE ONLY  Result Date: 05/04/2021 There is no interpretation for this exam.  This order is for images obtained during a surgical procedure.  Please See "Surgeries" Tab for more information regarding the procedure.    Cardiac Studies   None   Patient Profile     87 y.o. female with a hx of with a history of an aortic thrombosed descending aortic dissection, aortic valve sclerosis and  chronic afib on Xarelto who is being seefor the evaluation of atrial fib      Assessment & Plan    ATRIAL FIB:    Her rate has been relatively controlled.  Continued IV Cardizem through surgery, will convert to PO diltiazem today.  OK to restart Xarelto tonight per surgery. Will plan to restart PO metoprolol if rates not controlled on diltiazem.  Can continue home digoxin.   AORTIC DISSECTION:   Chronic and not an issue.     CHRONIC DIASTOLIC HEART FAILURE:   Appears mildly hypervolemic (+JVD on exam), likely due to fluids received during surgery.  Will restart home PO lasix.     For questions or updates, please contact [place] Please consult [email or web address] for contact info under Cardiology/STEMI.   Signed, Donato Heinz, MD  05/05/2021, 10:30 AM

## 2021-05-06 ENCOUNTER — Inpatient Hospital Stay (HOSPITAL_COMMUNITY): Payer: Medicare Other

## 2021-05-06 DIAGNOSIS — I5033 Acute on chronic diastolic (congestive) heart failure: Secondary | ICD-10-CM

## 2021-05-06 LAB — BASIC METABOLIC PANEL
Anion gap: 9 (ref 5–15)
BUN: 18 mg/dL (ref 8–23)
CO2: 26 mmol/L (ref 22–32)
Calcium: 8.6 mg/dL — ABNORMAL LOW (ref 8.9–10.3)
Chloride: 97 mmol/L — ABNORMAL LOW (ref 98–111)
Creatinine, Ser: 0.76 mg/dL (ref 0.44–1.00)
GFR, Estimated: >60 mL/min (ref >60)
Glucose, Bld: 238 mg/dL — ABNORMAL HIGH (ref 70–99)
Potassium: 4.3 mmol/L (ref 3.5–5.1)
Sodium: 132 mmol/L — ABNORMAL LOW (ref 135–145)

## 2021-05-06 LAB — GLUCOSE, CAPILLARY
Glucose-Capillary: 217 mg/dL — ABNORMAL HIGH (ref 70–99)
Glucose-Capillary: 384 mg/dL — ABNORMAL HIGH (ref 70–99)
Glucose-Capillary: 251 mg/dL — ABNORMAL HIGH (ref 70–99)
Glucose-Capillary: 227 mg/dL — ABNORMAL HIGH (ref 70–99)

## 2021-05-06 LAB — CBC
HCT: 31.7 % — ABNORMAL LOW (ref 36.0–46.0)
Hemoglobin: 10.5 g/dL — ABNORMAL LOW (ref 12.0–15.0)
MCH: 32 pg (ref 26.0–34.0)
MCHC: 33.1 g/dL (ref 30.0–36.0)
MCV: 96.6 fL (ref 80.0–100.0)
Platelets: 185 10*3/uL (ref 150–400)
RBC: 3.28 MIL/uL — ABNORMAL LOW (ref 3.87–5.11)
RDW: 14.1 % (ref 11.5–15.5)
WBC: 9.9 10*3/uL (ref 4.0–10.5)
nRBC: 0 % (ref 0.0–0.2)

## 2021-05-06 MED ORDER — FUROSEMIDE 10 MG/ML IJ SOLN
40.0000 mg | Freq: Every day | INTRAMUSCULAR | Status: DC
Start: 2021-05-06 — End: 2021-05-07
  Administered 2021-05-06 – 2021-05-07 (×2): 40 mg via INTRAVENOUS
  Filled 2021-05-06 (×2): qty 4

## 2021-05-06 MED ORDER — METOPROLOL TARTRATE 25 MG PO TABS
25.0000 mg | ORAL_TABLET | Freq: Two times a day (BID) | ORAL | Status: DC
  Administered 2021-05-06 (×2): 25 mg via ORAL
  Filled 2021-05-06 (×3): qty 1

## 2021-05-06 MED ORDER — DILTIAZEM HCL ER COATED BEADS 180 MG PO CP24
300.0000 mg | ORAL_CAPSULE | Freq: Every day | ORAL | Status: DC
  Administered 2021-05-06 – 2021-05-07 (×2): 300 mg via ORAL
  Filled 2021-05-06 (×3): qty 1

## 2021-05-06 NOTE — Progress Notes (Signed)
Orthopedics Progress Note  Subjective: Right shoulder pain is controlled. Arm is waking up from block  Objective:  Vitals:   05/05/21 1933 05/06/21 0445  BP: 114/89 139/90  Pulse: 94 (!) 147  Resp: 19 20  Temp: 98.9 F (37.2 C) 97.6 F (36.4 C)  SpO2: 96% 98%    General: Awake and alert  Musculoskeletal: Right shoulder dressing changed. Wound is CDI Neurovascularly intact  Lab Results  Component Value Date   WBC 9.9 05/06/2021   HGB 10.5 (L) 05/06/2021   HCT 31.7 (L) 05/06/2021   MCV 96.6 05/06/2021   PLT 185 05/06/2021       Component Value Date/Time   NA 132 (L) 05/06/2021 0310   NA 139 04/06/2019 1235   K 4.3 05/06/2021 0310   CL 97 (L) 05/06/2021 0310   CO2 26 05/06/2021 0310   GLUCOSE 238 (H) 05/06/2021 0310   BUN 18 05/06/2021 0310   BUN 17 04/06/2019 1235   CREATININE 0.76 05/06/2021 0310   CREATININE 0.71 12/31/2015 1504   CALCIUM 8.6 (L) 05/06/2021 0310   GFRNONAA >60 05/06/2021 0310   GFRAA >60 10/19/2019 1640    Lab Results  Component Value Date   INR 1.11 03/11/2017   INR 1.09 03/11/2017   INR 1.16 03/10/2017    Assessment/Plan: s/p Procedure(s): REVERSE SHOULDER ARTHROPLASTY Stable from ortho standpoint D/C planning Will need to see me back in the office in two weeks  OT today  Remo Lipps R. Veverly Fells, MD 05/06/2021 7:44 AM
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
## 2021-05-06 NOTE — Plan of Care (Signed)
°  Problem: Acute Rehab OT Goals (only OT should resolve) Goal: Pt. Will Perform Lower Body Dressing Outcome: Not Applicable

## 2021-05-06 NOTE — Evaluation (Signed)
Occupational Therapy Evaluation Patient Details Name: Tara Bradley MRN: [id number] DOB: 1922-11-20 Today's Date: 05/06/2021   History of Present Illness This 87 y.o. female admitted 05/02/21 after sustaining a fall.  She does endorse hitting her head, and was found to have severely displaced proximal right humeral neck fracture of indeterminate age along with old displaced acromial fracture.   She was also found to have A-Fib with RVR with rates in 150s and hypoxia with ambulation requiring 2L 02. s/p 05/04/21 R total reverse arthroplasty. PMH includes: arthritis, aortic valve stenosis, bradycardia, DM, HTN, A-Fib, syncope, ho ulnar fx   Clinical Impression   Pt presents with decline in function and safety with ADLs and ADL mobility with impaired strength, balance, endurance and R UE functional use (NWB, no AROM of shoulder). Pt reports that PTA, she lived at home alone and was Ind with ADLs/selfcare, home mgt, cooking and used a Marketing executive for mobility. Pt currently requires mod - max A with ADLs, mod A with toileting and min A with mobility. Pt educated on shoulder precautions, ROM restrictions R UE, AROM R elbow/wrist/hand with handout provided. Shoulder and sling education handout provided.Pt educated on compensatory techniques for UB bathing and dressing. Pt would benefit from acute OT services to address impairments to maximize level of function and safety     Recommendations for follow up therapy are one component of a multi-disciplinary discharge planning process, led by the attending physician.  Recommendations may be updated based on patient status, additional functional criteria and insurance authorization.   Follow Up Recommendations  Skilled nursing-short term rehab (<3 hours/day)    Assistance Recommended at Discharge Frequent or constant Supervision/Assistance  Functional Status Assessment  Patient has had a recent decline in their functional status and demonstrates the ability to  make significant improvements in function in a reasonable and predictable amount of time.  Equipment Recommendations  None recommended by OT    Recommendations for Other Services       Precautions / Restrictions Precautions Precautions: Fall Precaution Comments: displaced R humerus fx; monitor HR Required Braces or Orthoses: Sling Restrictions Weight Bearing Restrictions: No RUE Weight Bearing: Non weight bearing Other Position/Activity Restrictions: can use R UE to guide RW not bear weight, gentle ADLs per ortho MD      Mobility Bed Mobility Overal bed mobility: Needs Assistance Bed Mobility: Supine to Sit           General bed mobility comments: assist to scoot hips and for R UE support    Transfers Overall transfer level: Needs assistance Equipment used: 1 person hand held assist Transfers: Sit to/from Stand Sit to Stand: Min assist;From elevated surface     Step pivot transfers: Min assist            Balance Overall balance assessment: Needs assistance Sitting-balance support: Feet supported;Single extremity supported;No upper extremity supported Sitting balance-Leahy Scale: Good     Standing balance support: Single extremity supported;During functional activity Standing balance-Leahy Scale: Poor                             ADL either performed or assessed with clinical judgement   ADL Overall ADL's : Needs assistance/impaired Eating/Feeding: Set up;Sitting   Grooming: Wash/dry hands;Wash/dry face;Set up;Supervision/safety;Sitting   Upper Body Bathing: Moderate assistance;Sitting   Lower Body Bathing: Maximal assistance;Sitting/lateral leans   Upper Body Dressing : Moderate assistance;Sitting   Lower Body Dressing: Total assistance   Toilet Transfer: Minimal  assistance;Stand-pivot;BSC/3in1   Toileting- Water quality scientist and Hygiene: Moderate assistance;Sit to/from stand       Functional mobility during ADLs: Minimal  assistance;Cueing for safety General ADL Comments: Pt educated on compensatory techniques for UB bathing and dressing     Vision Baseline Vision/History: 1 Wears glasses Ability to See in Adequate Light: 0 Adequate Patient Visual Report: No change from baseline       Perception     Praxis      Pertinent Vitals/Pain Pain Assessment: Faces Faces Pain Scale: Hurts a little bit Pain Location: R UE Pain Descriptors / Indicators: Grimacing;Guarding Pain Intervention(s): Monitored during session;Repositioned     Hand Dominance Right   Extremity/Trunk Assessment Upper Extremity Assessment Upper Extremity Assessment: Generalized weakness;RUE deficits/detail RUE Deficits / Details: humerus fracture, immobilized RUE: Unable to fully assess due to immobilization   Lower Extremity Assessment Lower Extremity Assessment: Defer to PT evaluation   Cervical / Trunk Assessment Cervical / Trunk Assessment: Normal   Communication Communication Communication: No difficulties   Cognition Arousal/Alertness: Awake/alert Behavior During Therapy: WFL for tasks assessed/performed Overall Cognitive Status: Within Functional Limits for tasks assessed                                       General Comments       Exercises General Exercises - Upper Extremity Elbow Flexion: AROM;5 reps;Seated;Right Elbow Extension: AROM;5 reps;Seated;Right Wrist Flexion: AROM;5 reps;Seated;Right Wrist Extension: AROM;5 reps;Seated;Right;Both Digit Composite Flexion: AROM;Right;5 reps;Seated Composite Extension: AROM;Right;5 reps;Seated Other Exercises Other Exercises: Pt educated on shoulder precautions, ROM restrictions R UE, AROM R elbow/wrist/hand with handout provided. Shoulder and sling education handout provided.Pt educated on compensatory techniques for UB bathing and dressing   Shoulder Instructions Shoulder Instructions Donning/doffing shirt without moving shoulder: Moderate  assistance Method for sponge bathing under operated UE: Moderate assistance Donning/doffing sling/immobilizer: Moderate assistance Sling wearing schedule (on at all times/off for ADL's): Supervision/safety Proper positioning of operated UE when showering: Supervision/safety    Home Living Family/patient expects to be discharged to:: Private residence Living Arrangements: Alone Available Help at Discharge: Friend(s) Type of Home: Apartment Home Access: Elevator     Home Layout: One level     Bathroom Shower/Tub: Occupational psychologist: Handicapped height     Home Equipment: Civil engineer, contracting;Toilet riser;BSC/3in1;Rollator (4 wheels);Rolling Walker (2 wheels);Cane - single point;Other (comment);Wheelchair - manual          Prior Functioning/Environment Prior Level of Function : Driving;Independent/Modified Independent             Mobility Comments: Uses rollator. No other falls in past 6 months per pt. ADLs Comments: Cleans, cooks, and performs all ADLs without assistance.        OT Problem List: Decreased strength;Decreased range of motion;Decreased activity tolerance;Impaired balance (sitting and/or standing);Decreased coordination;Decreased safety awareness;Impaired sensation;Impaired UE functional use;Pain      OT Treatment/Interventions: Self-care/ADL training;Therapeutic exercise;Energy conservation;DME and/or AE instruction;Therapeutic activities;Patient/family education;Balance training    OT Goals(Current goals can be found in the care plan section) Acute Rehab OT Goals Patient Stated Goal: go home OT Goal Formulation: With patient Time For Goal Achievement: 05/20/21 Potential to Achieve Goals: Good ADL Goals Pt Will Perform Grooming: with min assist;with min guard assist;standing Pt Will Perform Upper Body Bathing: with min assist;sitting Pt Will Perform Lower Body Bathing: with mod assist;sitting/lateral leans Pt Will Perform Upper Body Dressing:  with min assist;sitting Pt Will Transfer  to Toilet: with min assist;with min guard assist;ambulating Pt Will Perform Toileting - Clothing Manipulation and hygiene: with min assist;sit to/from stand Additional ADL Goal #1: Pt will recall NWB restrictions for R shoulder and perform AROM exercises for R elbow, wrist and hand with Sup Additional ADL Goal #2: Pt will verbalize proper sling donning technique and positoning of R UE with Sup  OT Frequency: Min 2X/week   Barriers to D/C:            Co-evaluation              AM-PAC OT "6 Clicks" Daily Activity     Outcome Measure Help from another person eating meals?: A Little Help from another person taking care of personal grooming?: A Little Help from another person toileting, which includes using toliet, bedpan, or urinal?: A Lot Help from another person bathing (including washing, rinsing, drying)?: A Lot Help from another person to put on and taking off regular upper body clothing?: A Lot Help from another person to put on and taking off regular lower body clothing?: Total 6 Click Score: 13   End of Session Equipment Utilized During Treatment: Gait belt  Activity Tolerance: Patient tolerated treatment well Patient left: in chair;with call bell/phone within reach;Other (comment) (xray staff in to see pt)  OT Visit Diagnosis: Unsteadiness on feet (R26.81);Other abnormalities of gait and mobility (R26.89);Muscle weakness (generalized) (M62.81);Pain Pain - Right/Left: Right Pain - part of body: Arm;Shoulder                Time: 1610-9604 OT Time Calculation (min): 33 min Charges:  OT General Charges $OT Visit: 1 Visit OT Evaluation $OT Re-eval: 1 Re-eval OT Treatments $Self Care/Home Management : 8-22 mins $Therapeutic Activity: 8-22 mins   Britt Bottom 05/06/2021, 1:55 PM
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
## 2021-05-06 NOTE — TOC Progression Note (Signed)
Transition of Care [facility]) - Progression Note    Patient Details  Name: Tara Bradley MRN: [id number] Date of Birth: 05/01/1923  Transition of [facility]) CM/SW [place], [place] Phone Number: 05/06/2021, 1:41 PM  Clinical Narrative:     CSW was informed by CM that patient is now agreeable to SNF placement. CSW met with patient at bedside. Patient reports she comes from home alone. Patient initially decided to go home with home health with assist from friend that is CNA that could provide 24/7 supervision but after speaking with her family she has decided its best to pursue SNF placement.Patient gave CSW permission to fax out initial referral near the [place] area. Patient reports she has received both Covid Vaccines as well as 3 boosters. CSW requested updated PT note. CSW awaiting updated PT note to start insurance authorization. CSW will provide SNF bed offers to patient when available. CSW will continue to follow and assist with patients dc planning needs.  Expected Discharge Plan: [place] Barriers to Discharge: Continued Medical Work up  Expected Discharge Plan and Services Expected Discharge Plan: [place] arrangements for the past 2 months: Apartment                                       Social Determinants of Health (SDOH) Interventions    Readmission Risk Interventions No flowsheet data found.

## 2021-05-06 NOTE — Progress Notes (Signed)
PROGRESS NOTE    Tara Bradley  TTS:[id number] DOB: [birth year] DOA: 05/02/2021 PCP: [name], MD   Chief Complain: Right shoulder pain  Brief Narrative: Patient is a 87 year old female with history of chronic A. fib on Xarelto, hypertension, hyperlipidemia, insomnia who presented here with complaints of right shoulder pain after falling at home.  She was also found to be in rapid A. fib in presentation.  BP was soft and she was hypotensive.  X-ray showed severely displaced fracture of the right humerus with old abdominal fracture.  She underwent reverse shoulder arthroplasty on 12/12.  Hospital was remarkable for A. fib.  Cardiology consulted and following.  Optimizing her medications before planning for discharge to home with home health  Assessment & Plan:   Principal Problem:   Right humeral fracture Active Problems:   DM type 2, controlled, with complication (HCC)   Persistent atrial fibrillation with rapid ventricular response (HCC)   Osteoporosis   Chronic anticoagulation   Hyperlipidemia   Chronic insomnia   Chronic congestive heart failure with left ventricular diastolic dysfunction (HCC)   Acute on chronic diastolic heart failure ([place])   Fall at home, initial encounter   Atrial fibrillation with RVR ([place])   S/P shoulder replacement, right   Chronic A. Fib/rapid A. fib: Went again into rapid A. fib this morning.  Cardiology following.  On digoxin, Cardizem, metoprolol.  Continue to monitor on telemetry.  On Xarelto for anticoagulation.  Traumatic acute displaced, closed right humerus fracture: Status post reverse shoulder arthroplasty on 12/12 by Dr. Alma Friendly.  Continue pain management, supportive care.  Follow-up with orthopedics as an outpatient.  No weightbearing  Acute on chronic diastolic congestive heart failure: Volume overloaded after surgery.  Started on Lasix IV 40 mg daily.  Cardiology following. Losartan on hold due to soft blood pressure. Continue to  monitor daily output, input, weight  Acute hypoxic respiratory failure: On 2 L of oxygen currently.  Most likely secondary to fluid overload.  Continue diuresis.  We will try to wean the oxygen.  Not on oxygen at home  Type 2 diabetes: A1c of 8.6 indicating poor control.  On metformin at home.  Continue current insulin regimen.  Monitor blood sugars  Hypokalemia: Being supplemented and monitored  History of aortic dissection: Chronic.  Stable  Insomnia: Trazodone  Fall/deconditioning/debility/advanced age: PT/OT initially recommended skilled nursing facility on discharge but patient wants to go home with home health.  Recommendation is now for home health PT.         DVT prophylaxis:Xarelto Code Status:Full  Family Communication: None at bedside Patient status:Inpatient  Dispo: The patient is from: Independent living facilty              Anticipated d/c is to: ILF vs SNF              Anticipated d/c date is: Likely tomorrow  Consultants: Cardiology, orthopedics  Procedures:Arthoplasty  Antimicrobials:  Anti-infectives (From admission, onward)    Start     Dose/Rate Route Frequency Ordered Stop   05/04/21 2300  ceFAZolin (ANCEF) IVPB 2g/100 mL premix        2 g 200 mL/hr over 30 Minutes Intravenous Every 6 hours 05/04/21 2052 05/05/21 1324   05/04/21 0630  ceFAZolin (ANCEF) IVPB 2g/100 mL premix        2 g 200 mL/hr over 30 Minutes Intravenous On call to O.R. 05/03/21 1958 05/04/21 1650       Subjective:  Patient seen and examined at the  bedside this morning.  She was in rapid A. fib.  Any shortness of breath, chest pain or palpitations.  Blood pressure stable.  She was concerned about being alone at independent living facility for discharge  Objective: Vitals:   05/05/21 1800 05/05/21 1933 05/06/21 0445 05/06/21 0500  BP:  114/89 139/90   Pulse: (!) 132 94 (!) 147   Resp:  19 20   Temp:  98.9 F (37.2 C) 97.6 F (36.4 C)   TempSrc:  Oral Oral   SpO2: 95%  96% 98%   Weight:    70.3 kg  Height:        Intake/Output Summary (Last 24 hours) at 05/06/2021 0942 Last data filed at 05/06/2021 4481 Gross per 24 hour  Intake 150 ml  Output 700 ml  Net -550 ml   Filed Weights   05/04/21 0450 05/05/21 0530 05/06/21 0500  Weight: 65.5 kg 66 kg 70.3 kg    Examination:  General exam: Overall comfortable, not in distress, pleasant elderly female HEENT: PERRL Respiratory system:  no wheezes or crackles  Cardiovascular system: A. fib with RVR Gastrointestinal system: Abdomen is nondistended, soft and nontender. Central nervous system: Alert and oriented Extremities: No edema, no clubbing ,no cyanosis, sling on the right arm Skin: No rashes, no ulcers,no icterus      Data Reviewed: I have personally reviewed following labs and imaging studies  CBC: Recent Labs  Lab 05/02/21 0506 05/03/21 0340 05/04/21 0105 05/05/21 0413 05/06/21 0310  WBC 9.0 6.4 6.6 9.7 9.9  NEUTROABS 6.4  --   --   --   --   HGB 14.2 11.6* 11.2* 11.4* 10.5*  HCT 42.8 34.7* 33.3* 34.8* 31.7*  MCV 95.5 96.7 96.8 97.2 96.6  PLT 190 157 153 170 856   Basic Metabolic Panel: Recent Labs  Lab 05/02/21 0506 05/03/21 0340 05/04/21 0105 05/05/21 0413 05/06/21 0310  NA 136 132* 133* 134* 132*  K 3.2* 4.2 4.1 5.0 4.3  CL 97* 100 100 101 97*  CO2 30 23 23 22 26   GLUCOSE 228* 190* 154* 313* 238*  BUN 24* 17 17 11 18   CREATININE 0.90 0.68 0.70 0.83 0.76  CALCIUM 9.5 7.9* 8.2* 8.4* 8.6*  MG 1.9  --   --   --   --    GFR: Estimated Creatinine Clearance: 38.7 mL/min (by C-G formula based on SCr of 0.76 mg/dL). Liver Function Tests: No results for input(s): AST, ALT, ALKPHOS, BILITOT, PROT, ALBUMIN in the last 168 hours. No results for input(s): LIPASE, AMYLASE in the last 168 hours. No results for input(s): AMMONIA in the last 168 hours. Coagulation Profile: No results for input(s): INR, PROTIME in the last 168 hours. Cardiac Enzymes: No results for input(s):  CKTOTAL, CKMB, CKMBINDEX, TROPONINI in the last 168 hours. BNP (last 3 results) No results for input(s): PROBNP in the last 8760 hours. HbA1C: No results for input(s): HGBA1C in the last 72 hours. CBG: Recent Labs  Lab 05/05/21 0750 05/05/21 1223 05/05/21 1547 05/05/21 2133 05/06/21 0726  GLUCAP 283* 418* 322* 264* 217*   Lipid Profile: No results for input(s): CHOL, HDL, LDLCALC, TRIG, CHOLHDL, LDLDIRECT in the last 72 hours. Thyroid Function Tests: Recent Labs    05/04/21 0105  TSH 2.569   Anemia Panel: No results for input(s): VITAMINB12, FOLATE, FERRITIN, TIBC, IRON, RETICCTPCT in the last 72 hours. Sepsis Labs: No results for input(s): PROCALCITON, LATICACIDVEN in the last 168 hours.  Recent Results (from the past 240 hour(s))  Resp Panel by RT-PCR (Flu A&B, Covid) Nasopharyngeal Swab     Status: None   Collection Time: 05/02/21  8:49 AM   Specimen: Nasopharyngeal Swab; Nasopharyngeal(NP) swabs in vial transport medium  Result Value Ref Range Status   SARS Coronavirus 2 by RT PCR NEGATIVE NEGATIVE Final    Comment: (NOTE) SARS-CoV-2 target nucleic acids are NOT DETECTED.  The SARS-CoV-2 RNA is generally detectable in upper respiratory specimens during the acute phase of infection. The lowest concentration of SARS-CoV-2 viral copies this assay can detect is 138 copies/mL. A negative result does not preclude SARS-Cov-2 infection and should not be used as the sole basis for treatment or other patient management decisions. A negative result may occur with  improper specimen collection/handling, submission of specimen other than nasopharyngeal swab, presence of viral mutation(s) within the areas targeted by this assay, and inadequate number of viral copies(<138 copies/mL). A negative result must be combined with clinical observations, patient history, and epidemiological information. The expected result is Negative.  Fact Sheet for Patients:   EntrepreneurPulse.com.au  Fact Sheet for Healthcare Providers:  IncredibleEmployment.be  This test is no t yet approved or cleared by the [place] FDA and  has been authorized for detection and/or diagnosis of SARS-CoV-2 by FDA under an Emergency Use Authorization (EUA). This EUA will remain  in effect (meaning this test can be used) for the duration of the COVID-19 declaration under Section 564(b)(1) of the Act, 21 U.S.C.section 360bbb-3(b)(1), unless the authorization is terminated  or revoked sooner.       Influenza A by PCR NEGATIVE NEGATIVE Final   Influenza B by PCR NEGATIVE NEGATIVE Final    Comment: (NOTE) The Xpert Xpress SARS-CoV-2/FLU/RSV plus assay is intended as an aid in the diagnosis of influenza from Nasopharyngeal swab specimens and should not be used as a sole basis for treatment. Nasal washings and aspirates are unacceptable for Xpert Xpress SARS-CoV-2/FLU/RSV testing.  Fact Sheet for Patients: EntrepreneurPulse.com.au  Fact Sheet for Healthcare Providers: IncredibleEmployment.be  This test is not yet approved or cleared by the [place] FDA and has been authorized for detection and/or diagnosis of SARS-CoV-2 by FDA under an Emergency Use Authorization (EUA). This EUA will remain in effect (meaning this test can be used) for the duration of the COVID-19 declaration under Section 564(b)(1) of the Act, 21 U.S.C. section 360bbb-3(b)(1), unless the authorization is terminated or revoked.  Performed at [facility], [place] [address] [place], [place] [address]   Surgical PCR screen     Status: None   Collection Time: 05/04/21  8:28 AM   Specimen: Nasal Mucosa; Nasal Swab  Result Value Ref Range Status   MRSA, PCR NEGATIVE NEGATIVE Final   Staphylococcus aureus NEGATIVE NEGATIVE Final    Comment: (NOTE) The Xpert SA Assay (FDA approved for NASAL specimens in patients  18 years of age and older), is one component of a comprehensive surveillance program. It is not intended to diagnose infection nor to guide or monitor treatment. Performed at [facility], [place] [address] [place], [place] [address]          Radiology Studies: DG Shoulder Right Port  Result Date: 05/04/2021 CLINICAL DATA:  Shoulder replacements EXAM: PORTABLE RIGHT SHOULDER COMPARISON:  05/02/2021, 03/10/2017 FINDINGS: Interval right shoulder replacement with normal alignment. Residual displaced acromial fracture fragment is noted. Old right-sided rib fractures. IMPRESSION: 1. Interval right shoulder replacement with normal alignment 2. Old acromial fracture fragment redemonstrated. Electronically Signed   By: Maudie Mercury  Francoise Ceo M.D.   On: 05/04/2021 21:25   DG MINI C-ARM IMAGE ONLY  Result Date: 05/04/2021 There is no interpretation for this exam.  This order is for images obtained during a surgical procedure.  Please See "Surgeries" Tab for more information regarding the procedure.        Scheduled Meds:  digoxin  0.0625 mg Oral Daily   diltiazem  300 mg Oral Daily   docusate sodium  100 mg Oral BID   furosemide  40 mg Intravenous Daily   insulin aspart  0-6 Units Subcutaneous TID WC   insulin detemir  5 Units Subcutaneous Daily   metoprolol tartrate  25 mg Oral BID   mupirocin ointment  1 application Nasal BID   polyethylene glycol  17 g Oral Daily   potassium chloride  10 mEq Oral Daily   rivaroxaban  15 mg Oral Q supper   senna-docusate  1 tablet Oral BID   sodium chloride flush  3 mL Intravenous Q12H   Continuous Infusions:  sodium chloride 10 mL/hr at 05/05/21 0321     LOS: 4 days    Time spent:35 mins. More than 50% of that time was spent in counseling and/or coordination of care.      Shelly Coss, MD [facility] P12/14/2022, 9:42 AM
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
## 2021-05-06 NOTE — Progress Notes (Signed)
Progress Note  Patient Name: Tara Bradley Date of Encounter: 05/06/2021  Primary Cardiologist:   [name], MD   Subjective   BP 139/90.  Denies chest pain.  Does report dyspnea, on 2L [place].  Inpatient Medications    Scheduled Meds:  digoxin  0.0625 mg Oral Daily   diltiazem  300 mg Oral Daily   docusate sodium  100 mg Oral BID   furosemide  40 mg Oral Daily   insulin aspart  0-6 Units Subcutaneous TID WC   insulin detemir  5 Units Subcutaneous Daily   metoprolol tartrate  25 mg Oral BID   mupirocin ointment  1 application Nasal BID   polyethylene glycol  17 g Oral Daily   potassium chloride  10 mEq Oral Daily   rivaroxaban  15 mg Oral Q supper   senna-docusate  1 tablet Oral BID   sodium chloride flush  3 mL Intravenous Q12H   Continuous Infusions:  sodium chloride 10 mL/hr at 05/05/21 0321   PRN Meds: sodium chloride, acetaminophen **OR** acetaminophen, albuterol, bisacodyl, HYDROcodone-acetaminophen, menthol-cetylpyridinium **OR** phenol, metoCLOPramide **OR** metoCLOPramide (REGLAN) injection, morphine injection, ondansetron **OR** ondansetron (ZOFRAN) IV, polyethylene glycol, traZODone   Vital Signs    Vitals:   05/05/21 1800 05/05/21 1933 05/06/21 0445 05/06/21 0500  BP:  114/89 139/90   Pulse: (!) 132 94 (!) 147   Resp:  19 20   Temp:  98.9 F (37.2 C) 97.6 F (36.4 C)   TempSrc:  Oral Oral   SpO2: 95% 96% 98%   Weight:    70.3 kg  Height:        Intake/Output Summary (Last 24 hours) at 05/06/2021 0912 Last data filed at 05/06/2021 0644 Gross per 24 hour  Intake 150 ml  Output 700 ml  Net -550 ml    Filed Weights   05/04/21 0450 05/05/21 0530 05/06/21 0500  Weight: 65.5 kg 66 kg 70.3 kg    Telemetry    Atrial fib with rates 100-140s- Personally Reviewed  ECG    NA - Personally Reviewed  Physical Exam   GEN: No acute distress.   Neck: + JVD Cardiac: Irregular RR, no murmurs, rubs, or gallops.  Respiratory: Crackles L  base GI: Soft, nontender, non-distended  MS: No  edema; No deformity. Neuro:  Nonfocal  Psych: Normal affect   Labs    Chemistry Recent Labs  Lab 05/04/21 0105 05/05/21 0413 05/06/21 0310  NA 133* 134* 132*  K 4.1 5.0 4.3  CL 100 101 97*  CO2 23 22 26   GLUCOSE 154* 313* 238*  BUN 17 11 18   CREATININE 0.70 0.83 0.76  CALCIUM 8.2* 8.4* 8.6*  GFRNONAA >60 >60 >60  ANIONGAP 10 11 9       Hematology Recent Labs  Lab 05/04/21 0105 05/05/21 0413 05/06/21 0310  WBC 6.6 9.7 9.9  RBC 3.44* 3.58* 3.28*  HGB 11.2* 11.4* 10.5*  HCT 33.3* 34.8* 31.7*  MCV 96.8 97.2 96.6  MCH 32.6 31.8 32.0  MCHC 33.6 32.8 33.1  RDW 13.9 13.7 14.1  PLT 153 170 185     Cardiac EnzymesNo results for input(s): TROPONINI in the last 168 hours. No results for input(s): TROPIPOC in the last 168 hours.   BNPNo results for input(s): BNP, PROBNP in the last 168 hours.   DDimer No results for input(s): DDIMER in the last 168 hours.   Radiology    DG Shoulder Right Port  Result Date: 05/04/2021 CLINICAL DATA:  Shoulder replacements EXAM:  PORTABLE RIGHT SHOULDER COMPARISON:  05/02/2021, 03/10/2017 FINDINGS: Interval right shoulder replacement with normal alignment. Residual displaced acromial fracture fragment is noted. Old right-sided rib fractures. IMPRESSION: 1. Interval right shoulder replacement with normal alignment 2. Old acromial fracture fragment redemonstrated. Electronically Signed   By: Donavan Foil M.D.   On: 05/04/2021 21:25   DG MINI C-ARM IMAGE ONLY  Result Date: 05/04/2021 There is no interpretation for this exam.  This order is for images obtained during a surgical procedure.  Please See "Surgeries" Tab for more information regarding the procedure.    Cardiac Studies   None   Patient Profile     87 y.o. female with a hx of with a history of an aortic thrombosed descending aortic dissection, aortic valve sclerosis and chronic afib on Xarelto who is being seefor the  evaluation of atrial fib      Assessment & Plan    ATRIAL FIB:    Her rate has been elevated this morning  Will switch to home PO diltiazem 300 mg daily and metoprolol 25 mg BID.  She has been restarted on Xarelto.  Can continue home digoxin   ACUTE ON CHRONIC DIASTOLIC HEART FAILURE:   Appears hypervolemic (+JVD on exam) and new O2 requirement, likely due to fluids received during surgery.  Will check CXR.  Will start IV lasix 40 mg daily   For questions or updates, please contact [place] Please consult [email or web address] for contact info under Cardiology/STEMI.   Signed, Donato Heinz, MD  05/06/2021, 9:12 AM

## 2021-05-06 NOTE — Progress Notes (Signed)
Physical Therapy Treatment Patient Details Name: Tara Bradley MRN: [id number] DOB: [birth year] Today's Date: 05/06/2021   History of Present Illness This 87 y.o. female admitted 05/02/21 after sustaining a fall.  She does endorse hitting her head, and was found to have severely displaced proximal right humeral neck fracture of indeterminate age along with old displaced acromial fracture.   She was also found to have A-Fib with RVR with rates in 150s and hypoxia with ambulation requiring 2L 02. s/p 05/04/21 R total reverse arthroplasty. PMH includes: arthritis, aortic valve stenosis, bradycardia, DM, HTN, A-Fib, syncope, ho ulnar fx    PT Comments    Pt was eager to get up and try using the RW to walk in the halls.  Emphasis on transitions to EOB, scooting, sit to stands and progression of gait with the RW.    Recommendations for follow up therapy are one component of a multi-disciplinary discharge planning process, led by the attending physician.  Recommendations may be updated based on patient status, additional functional criteria and insurance authorization.  Follow Up Recommendations  Skilled nursing-short term rehab (<3 hours/day) (pt going ST until her dtr arrives.)     Assistance Recommended at Discharge    Equipment Recommendations  Other (comment) (TBA)    Recommendations for Other Services       Precautions / Restrictions Precautions Precautions: Fall Precaution Comments: displaced R humerus fx; monitor HR Required Braces or Orthoses: Sling Restrictions RUE Weight Bearing: Non weight bearing Other Position/Activity Restrictions: can use R UE to guide RW not bear weight, gentle ADLs per ortho MD     Mobility  Bed Mobility Overal bed mobility: Needs Assistance Bed Mobility: Rolling;Sidelying to Sit Rolling: Min assist Sidelying to sit: Min assist       General bed mobility comments: cues for technique, truncal assist to roll and stabilize until pt assisted with  L UE    Transfers Overall transfer level: Needs assistance Equipment used: 1 person hand held assist Transfers: Sit to/from Stand Sit to Stand: Min assist;From elevated surface     Step pivot transfers: Min assist          Ambulation/Gait Ambulation/Gait assistance: Min assist Gait Distance (Feet): 100 Feet Assistive device: Rolling walker (2 wheels) Gait Pattern/deviations: Step-through pattern;Decreased stride length Gait velocity: reduced Gait velocity interpretation: <1.8 ft/sec, indicate of risk for recurrent falls   General Gait Details: Generally steady with use of the RW.  Pt able to maneuver the RW well except for turns "on a dime" HR in the 90's, sats on RA dropped to 85% with quick return on 2L [place].   Stairs             Wheelchair Mobility    Modified Rankin (Stroke Patients Only)       Balance Overall balance assessment: Needs assistance Sitting-balance support: Feet supported;Single extremity supported;No upper extremity supported Sitting balance-Leahy Scale: Good     Standing balance support: Single extremity supported;During functional activity Standing balance-Leahy Scale: Poor Standing balance comment: Reliant on at least L UE support to stand for pericare                            Cognition Arousal/Alertness: Awake/alert Behavior During Therapy: WFL for tasks assessed/performed Overall Cognitive Status: Within Functional Limits for tasks assessed  Exercises General Exercises - Upper Extremity Elbow Flexion: AROM;5 reps;Seated;Right Elbow Extension: AROM;5 reps;Seated;Right Wrist Flexion: AROM;5 reps;Seated;Right Wrist Extension: AROM;5 reps;Seated;Right;Both Digit Composite Flexion: AROM;Right;5 reps;Seated Composite Extension: AROM;Right;5 reps;Seated Other Exercises Other Exercises: Pt educated on shoulder precautions, ROM restrictions R UE, AROM R  elbow/wrist/hand with handout provided. Shoulder and sling education handout provided.Pt educated on compensatory techniques for UB bathing and dressing Donning/doffing shirt without moving shoulder: Moderate assistance Method for sponge bathing under operated UE: Moderate assistance Donning/doffing sling/immobilizer: Moderate assistance Sling wearing schedule (on at all times/off for ADL's): Supervision/safety Proper positioning of operated UE when showering: Supervision/safety    General Comments        Pertinent Vitals/Pain Pain Assessment: Faces Faces Pain Scale: Hurts a little bit Pain Location: R UE Pain Descriptors / Indicators: Grimacing;Guarding Pain Intervention(s): Monitored during session    Home Living Family/patient expects to be discharged to:: Private residence Living Arrangements: Alone Available Help at Discharge: Friend(s) Type of Home: Apartment Home Access: Elevator       Home Layout: One level Home Equipment: Shower seat;Toilet riser;BSC/3in1;Rollator (4 wheels);Rolling Walker (2 wheels);Cane - single point;Other (comment);Wheelchair - manual      Prior Function            PT Goals (current goals can now be found in the care plan section) Acute Rehab PT Goals PT Goal Formulation: With patient Time For Goal Achievement: 05/17/21 Potential to Achieve Goals: Good Progress towards PT goals: Progressing toward goals    Frequency    Min 3X/week      PT Plan Discharge plan needs to be updated    Co-evaluation              AM-PAC PT "6 Clicks" Mobility   Outcome Measure  Help needed turning from your back to your side while in a flat bed without using bedrails?: A Little Help needed moving from lying on your back to sitting on the side of a flat bed without using bedrails?: A Little Help needed moving to and from a bed to a chair (including a wheelchair)?: A Little Help needed standing up from a chair using your arms (e.g., wheelchair or  bedside chair)?: A Little Help needed to walk in hospital room?: A Little Help needed climbing 3-5 steps with a railing? : A Lot 6 Click Score: 17    End of Session   Activity Tolerance: Patient tolerated treatment well Patient left: in chair;with call bell/phone within reach;with chair alarm set;with nursing/sitter in room Nurse Communication: Mobility status PT Visit Diagnosis: Other abnormalities of gait and mobility (R26.89);History of falling (Z91.81);Difficulty in walking, not elsewhere classified (R26.2);Pain Pain - Right/Left: Right Pain - part of body: Arm     Time: 5093-2671 PT Time Calculation (min) (ACUTE ONLY): 35 min  Charges:  $Gait Training: 8-22 mins $Therapeutic Activity: 8-22 mins                     05/06/2021  Ginger Carne., PT Acute Rehabilitation Services [phone]  (pager) [phone]  (office)   Tessie Fass Aneli Zara 05/06/2021, 5:37 PM

## 2021-05-06 NOTE — Progress Notes (Signed)
Mobility Specialist Progress Note    05/06/21 1653  Mobility  Activity Ambulated in hall  Level of Assistance Minimal assist, patient does 75% or more  Assistive Device Front wheel walker  Distance Ambulated (ft) 325 ft  Mobility Ambulated with assistance in hallway  Mobility Response Tolerated fair  Mobility performed by Mobility specialist  $Mobility charge 1 Mobility   Pt received in chair and agreeable. No complaints on walk. Was periodically SOB but had no lightheadedness or dizziness. Ambulated on RA and SpO2 in 80s, RN notified. Returned to bed with call bell in reach.   [facility] Mobility Specialist  M.S. Primary Phone: [phone] M.S. Secondary Phone: [phone]

## 2021-05-07 LAB — CBC
HCT: 33.6 % — ABNORMAL LOW (ref 36.0–46.0)
Hemoglobin: 11.1 g/dL — ABNORMAL LOW (ref 12.0–15.0)
MCH: 32.5 pg (ref 26.0–34.0)
MCHC: 33 g/dL (ref 30.0–36.0)
MCV: 98.2 fL (ref 80.0–100.0)
Platelets: 180 10*3/uL (ref 150–400)
RBC: 3.42 MIL/uL — ABNORMAL LOW (ref 3.87–5.11)
RDW: 14.6 % (ref 11.5–15.5)
WBC: 7.5 10*3/uL (ref 4.0–10.5)
nRBC: 0 % (ref 0.0–0.2)

## 2021-05-07 LAB — BASIC METABOLIC PANEL
Anion gap: 7 (ref 5–15)
BUN: 23 mg/dL (ref 8–23)
CO2: 27 mmol/L (ref 22–32)
Calcium: 8.4 mg/dL — ABNORMAL LOW (ref 8.9–10.3)
Chloride: 101 mmol/L (ref 98–111)
Creatinine, Ser: 0.83 mg/dL (ref 0.44–1.00)
GFR, Estimated: >60 mL/min (ref >60)
Glucose, Bld: 202 mg/dL — ABNORMAL HIGH (ref 70–99)
Potassium: 4.5 mmol/L (ref 3.5–5.1)
Sodium: 135 mmol/L (ref 135–145)

## 2021-05-07 LAB — GLUCOSE, CAPILLARY
Glucose-Capillary: 171 mg/dL — ABNORMAL HIGH (ref 70–99)
Glucose-Capillary: 341 mg/dL — ABNORMAL HIGH (ref 70–99)
Glucose-Capillary: 176 mg/dL — ABNORMAL HIGH (ref 70–99)
Glucose-Capillary: 279 mg/dL — ABNORMAL HIGH (ref 70–99)

## 2021-05-07 LAB — BRAIN NATRIURETIC PEPTIDE: B Natriuretic Peptide: 124.6 pg/mL — ABNORMAL HIGH (ref 0.0–100.0)

## 2021-05-07 MED ORDER — INSULIN ASPART 100 UNIT/ML IJ SOLN
0.0000 [IU] | Freq: Three times a day (TID) | INTRAMUSCULAR | Status: DC
  Administered 2021-05-07 – 2021-05-08 (×2): 3 [IU] via SUBCUTANEOUS
  Administered 2021-05-08 – 2021-05-09 (×4): 2 [IU] via SUBCUTANEOUS
  Administered 2021-05-09: 4 [IU] via SUBCUTANEOUS

## 2021-05-07 MED ORDER — METOPROLOL TARTRATE 5 MG/5ML IV SOLN
5.0000 mg | Freq: Once | INTRAVENOUS | Status: AC
  Administered 2021-05-07: 5 mg via INTRAVENOUS
  Filled 2021-05-07: qty 5

## 2021-05-07 MED ORDER — INSULIN ASPART 100 UNIT/ML IJ SOLN: 0.0000 [IU] | Freq: Three times a day (TID) | INTRAMUSCULAR | Status: DC

## 2021-05-07 MED ORDER — METOPROLOL SUCCINATE ER 50 MG PO TB24
50.0000 mg | ORAL_TABLET | Freq: Two times a day (BID) | ORAL | Status: DC
  Administered 2021-05-07 – 2021-05-09 (×4): 50 mg via ORAL
  Filled 2021-05-07 (×4): qty 1

## 2021-05-07 MED ORDER — METOPROLOL TARTRATE 50 MG PO TABS: 50.0000 mg | ORAL_TABLET | Freq: Two times a day (BID) | ORAL | Status: DC

## 2021-05-07 MED ORDER — METOPROLOL TARTRATE 25 MG PO TABS
25.0000 mg | ORAL_TABLET | Freq: Once | ORAL | Status: AC
  Administered 2021-05-07: 25 mg via ORAL
  Filled 2021-05-07: qty 1

## 2021-05-07 NOTE — Progress Notes (Signed)
Progress Note  Patient Name: Tara Bradley Date of Encounter: 05/07/2021  Primary Cardiologist:   [name], MD   Subjective   BP 123/78.  Incomplete I's and O's.  Weight is down 4 pounds from yesterday, but elevated 4 pounds from 12/10.  She reports dyspnea is improved.   Inpatient Medications    Scheduled Meds:  digoxin  0.0625 mg Oral Daily   diltiazem  300 mg Oral Daily   docusate sodium  100 mg Oral BID   furosemide  40 mg Intravenous Daily   insulin aspart  0-6 Units Subcutaneous TID WC   insulin detemir  5 Units Subcutaneous Daily   metoprolol tartrate  25 mg Oral BID   polyethylene glycol  17 g Oral Daily   potassium chloride  10 mEq Oral Daily   rivaroxaban  15 mg Oral Q supper   senna-docusate  1 tablet Oral BID   sodium chloride flush  3 mL Intravenous Q12H   Continuous Infusions:  sodium chloride 10 mL/hr at 05/05/21 0321   PRN Meds: sodium chloride, acetaminophen **OR** acetaminophen, albuterol, bisacodyl, HYDROcodone-acetaminophen, menthol-cetylpyridinium **OR** phenol, metoCLOPramide **OR** metoCLOPramide (REGLAN) injection, morphine injection, ondansetron **OR** ondansetron (ZOFRAN) IV, polyethylene glycol, traZODone   Vital Signs    Vitals:   05/07/21 0020 05/07/21 0424 05/07/21 0759 05/07/21 0959  BP: (!) 160/94 (!) 142/91 123/78   Pulse:  (!) 150 (!) 147 (!) 154  Resp: 20 20 20    Temp: 99 F (37.2 C) 98.8 F (37.1 C) 97.7 F (36.5 C)   TempSrc: Oral Oral Oral   SpO2: 100% 96% 91%   Weight:  68.7 kg    Height:        Intake/Output Summary (Last 24 hours) at 05/07/2021 1004 Last data filed at 05/06/2021 1039 Gross per 24 hour  Intake --  Output 400 ml  Net -400 ml    Filed Weights   05/05/21 0530 05/06/21 0500 05/07/21 0424  Weight: 66 kg 70.3 kg 68.7 kg    Telemetry    Atrial fib with rates 100-150s- Personally Reviewed  ECG    NA - Personally Reviewed  Physical Exam   GEN: No acute distress.   Neck: +  JVD Cardiac: Irregular RR, no murmurs, rubs, or gallops.  Respiratory: Crackles L base GI: Soft, nontender, non-distended  MS: No  edema; No deformity. Neuro:  Nonfocal  Psych: Normal affect   Labs    Chemistry Recent Labs  Lab 05/05/21 0413 05/06/21 0310 05/07/21 0126  NA 134* 132* 135  K 5.0 4.3 4.5  CL 101 97* 101  CO2 22 26 27   GLUCOSE 313* 238* 202*  BUN 11 18 23   CREATININE 0.83 0.76 0.83  CALCIUM 8.4* 8.6* 8.4*  GFRNONAA >60 >60 >60  ANIONGAP 11 9 7       Hematology Recent Labs  Lab 05/05/21 0413 05/06/21 0310 05/07/21 0126  WBC 9.7 9.9 7.5  RBC 3.58* 3.28* 3.42*  HGB 11.4* 10.5* 11.1*  HCT 34.8* 31.7* 33.6*  MCV 97.2 96.6 98.2  MCH 31.8 32.0 32.5  MCHC 32.8 33.1 33.0  RDW 13.7 14.1 14.6  PLT 170 185 180     Cardiac EnzymesNo results for input(s): TROPONINI in the last 168 hours. No results for input(s): TROPIPOC in the last 168 hours.   BNPNo results for input(s): BNP, PROBNP in the last 168 hours.   DDimer No results for input(s): DDIMER in the last 168 hours.   Radiology    DG CHEST PORT  1 VIEW  Result Date: 05/06/2021 CLINICAL DATA:  Shortness of breath following shoulder surgery. EXAM: PORTABLE CHEST 1 VIEW COMPARISON:  One-view chest x-ray 05/02/2021.  CTA chest 03/12/17 FINDINGS: Heart is enlarged. Atherosclerotic changes are noted in the arch. Scoliosis is present. A small right pleural effusion is present. Right greater than left basilar airspace disease is present. No other significant airspace disease is present. Bilateral rib fractures noted. Patient is status post right shoulder arthroplasty. Fluid gas are present in the joint space. IMPRESSION: Small right pleural effusion and right greater than left basilar airspace disease. While this likely reflects atelectasis, infection is not excluded. Electronically Signed   By: San Morelle M.D.   On: 05/06/2021 12:46    Cardiac Studies   None   Patient Profile     87 y.o. female  with a hx of with a history of an aortic thrombosed descending aortic dissection, aortic valve sclerosis and chronic afib on Xarelto who is being seefor the evaluation of atrial fib      Assessment & Plan    ATRIAL FIB:    Her rate has been elevated this morning she is currently on her home p.o. diltiazem 30 mg daily and metoprolol 25 mg twice daily.  Will increase metoprolol to 50 mg twice daily.  Continue home digoxin.  Continue Xarelto.   ACUTE ON CHRONIC DIASTOLIC HEART FAILURE:   Appears hypervolemic (+JVD on exam) and new O2 requirement, likely due to fluids received during surgery.  Chest x-ray with right pleural effusion.  Reports dyspnea improved with Lasix yesterday.  Continue IV Lasix today.   For questions or updates, please contact [place] Please consult [email or web address] for contact info under Cardiology/STEMI.   Signed, Donato Heinz, MD  05/07/2021, 10:04 AM

## 2021-05-07 NOTE — Care Management Important Message (Signed)
Important Message  Patient Details  Name: PAYGE EPPES MRN: [id number] Date of Birth: [birth year]   Medicare Important Message Given:  Yes     [name] 05/07/2021, 10:15 AM

## 2021-05-07 NOTE — Progress Notes (Signed)
Occupational Therapy Treatment Patient Details Name: Tara Bradley MRN: [id number] DOB: [birth year] Today's Date: 05/07/2021   History of present illness This 87 y.o. female admitted 05/02/21 after sustaining a fall.  She does endorse hitting her head, and was found to have severely displaced proximal right humeral neck fracture of indeterminate age along with old displaced acromial fracture.   She was also found to have A-Fib with RVR with rates in 150s and hypoxia with ambulation requiring 2L 02. s/p 05/04/21 R total reverse arthroplasty. PMH includes: arthritis, aortic valve stenosis, bradycardia, DM, HTN, A-Fib, syncope, ho ulnar fx   OT comments  Pt making good progress with functional goals. Session focused on sit - stand transitions from EOB to RW gor peri hygiene, donning clean underwear, BS transfers, UB dressing, R elbow/hand/wrist ROM and sling education. OT will continue to follow acutely to maximize level of function and safety   Recommendations for follow up therapy are one component of a multi-disciplinary discharge planning process, led by the attending physician.  Recommendations may be updated based on patient status, additional functional criteria and insurance authorization.    Follow Up Recommendations  Skilled nursing-short term rehab (<3 hours/day)    Assistance Recommended at Discharge Frequent or constant Supervision/Assistance  Equipment Recommendations  Other (comment) (TBD at SNF)    Recommendations for Other Services      Precautions / Restrictions Precautions Precautions: Fall Precaution Comments: displaced R humerus fx; monitor HR Required Braces or Orthoses: Sling Restrictions Weight Bearing Restrictions: No RUE Weight Bearing: Non weight bearing Other Position/Activity Restrictions: can use R UE to guide RW not bear weight, gentle ADLs per ortho MD       Mobility Bed Mobility               General bed mobility comments: pt sitting EOB with  NT present upon arrival    Transfers Overall transfer level: Needs assistance Equipment used: Rolling walker (2 wheels) Transfers: Sit to/from Stand Sit to Stand: Mod assist;Min assist;From elevated surface Stand pivot transfers: Min assist Step pivot transfers: Min assist       General transfer comment: mod A to power up from EOB     Balance Overall balance assessment: Needs assistance Sitting-balance support: Feet supported;Single extremity supported;No upper extremity supported Sitting balance-Leahy Scale: Good Sitting balance - Comments: assist for changing gown and repositioning sling in sitting   Standing balance support: Single extremity supported;During functional activity Standing balance-Leahy Scale: Poor                             ADL either performed or assessed with clinical judgement   ADL Overall ADL's : Needs assistance/impaired     Grooming: Wash/dry hands;Wash/dry face;Set up;Supervision/safety;Sitting           Upper Body Dressing : Moderate assistance;Sitting       Toilet Transfer: Minimal assistance;Stand-pivot;BSC/3in1   Toileting- Clothing Manipulation and Hygiene: Moderate assistance;Sit to/from stand;Cueing for safety Toileting - Clothing Manipulation Details (indicate cue type and reason): min A with hygiene standing at [facility] with RW, mod A with clothing mgt     Functional mobility during ADLs: Minimal assistance;Cueing for safety General ADL Comments: Pt stood at [facility] for changing underwear, clean gown and stood at [facility] for clothing mgt and perihygiene    Extremity/Trunk Assessment Upper Extremity Assessment Upper Extremity Assessment: Generalized weakness;RUE deficits/detail RUE Deficits / Details: humerus fracture, immobilized   Lower Extremity Assessment Lower Extremity Assessment:  Defer to PT evaluation   Cervical / Trunk Assessment Cervical / Trunk Assessment: Normal    Vision Baseline Vision/History: 1 Wears  glasses Ability to See in Adequate Light: 0 Adequate Patient Visual Report: No change from baseline     Perception     Praxis      Cognition Arousal/Alertness: Awake/alert Behavior During Therapy: WFL for tasks assessed/performed Overall Cognitive Status: Within Functional Limits for tasks assessed                                            Exercises Other Exercises Other Exercises: AROM R elbow/wrist/hand 10 reps seated EOB   Shoulder Instructions       General Comments      Pertinent Vitals/ Pain       Pain Assessment: Faces Faces Pain Scale: Hurts a little bit Pain Location: R UE Pain Descriptors / Indicators: Grimacing;Guarding;Sore Pain Intervention(s): Monitored during session;Premedicated before session;Repositioned  Home Living                                          Prior Functioning/Environment              Frequency  Min 2X/week        Progress Toward Goals  OT Goals(current goals can now be found in the care plan section)  Progress towards OT goals: Progressing toward goals     Plan Discharge plan remains appropriate;Frequency remains appropriate    Co-evaluation                 AM-PAC OT "6 Clicks" Daily Activity     Outcome Measure   Help from another person eating meals?: A Little Help from another person taking care of personal grooming?: A Little Help from another person toileting, which includes using toliet, bedpan, or urinal?: A Lot Help from another person bathing (including washing, rinsing, drying)?: A Lot Help from another person to put on and taking off regular upper body clothing?: A Lot Help from another person to put on and taking off regular lower body clothing?: Total 6 Click Score: 13    End of Session Equipment Utilized During Treatment: Gait belt;Rolling walker (2 wheels);Other (comment) (BSC)  OT Visit Diagnosis: Unsteadiness on feet (R26.81);Other abnormalities of  gait and mobility (R26.89);Muscle weakness (generalized) (M62.81);Pain Pain - Right/Left: Right Pain - part of body: Arm;Shoulder   Activity Tolerance Patient tolerated treatment well   Patient Left in chair;with call bell/phone within reach   Nurse Communication          Time: 1610-9604 OT Time Calculation (min): 24 min  Charges: OT General Charges $OT Visit: 1 Visit OT Treatments $Self Care/Home Management : 8-22 mins $Therapeutic Activity: 8-22 mins   Britt Bottom 05/07/2021, 1:36 PM

## 2021-05-07 NOTE — Progress Notes (Addendum)
Inpatient Diabetes Program Recommendations  AACE/ADA: New Consensus Statement on Inpatient Glycemic Control (2015)  Target Ranges:  Prepandial:   less than 140 mg/dL      Peak postprandial:   less than 180 mg/dL (1-2 hours)      Critically ill patients:  140 - 180 mg/dL   Lab Results  Component Value Date   GLUCAP 341 (H) 05/07/2021   HGBA1C 8.6 (H) 05/03/2021    Latest Reference Range & Units 05/06/21 11:32 05/06/21 16:06 05/06/21 21:57 05/07/21 07:57 05/07/21 11:52  Glucose-Capillary 70 - 99 mg/dL 384 (H) 251 (H) 227 (H) 171 (H) 341 (H)   Review of Glycemic Control  Diabetes history: type 2 Outpatient Diabetes medications: 50/50 insulin 5 units daily, Glumetza 500 mg daily Current orders for Inpatient glycemic control: Levemir 5 units daily, Novolog 0-6 units TID  Inpatient Diabetes Program Recommendations:   Noon CBG is 314 mg/dl after Novolog was given for am CBG at 1000 after CBG of 171 mg/dl.   Noted that patient's CBGs have been greater than 180 mg/dl. If blood sugars continue to be elevated, recommend increasing Levemir to 8 units daily. Titrate dosages as needed.   Harvel Ricks RN BSN CDE Diabetes Coordinator Pager: [phone]  8am-5pm

## 2021-05-07 NOTE — Progress Notes (Addendum)
HOSPITAL MEDICINE OVERNIGHT EVENT NOTE    Nursing has notified me that since the banding of her shift atrial fibrillation has become increasingly rapid with heart rates ranging between 140 and 150 bpm.  Reports the patient is currently hemodynamically stable and denies shortness of breath or chest pain.    Will attempt to give evening dose of metoprolol early and reassess.  Vernelle Emerald  MD [facility]   ADDENDUM (12/15 10:50PM)  Nursing states the patient's evening sugar is currently 354.  Sliding scale insulin orders adjusted to include nightly coverage.  Nursing reports that patient's atrial fibrillation is still quite rapid with heart rates in the 130s.  Patient remains hemodynamically stable.  We will augment recent administration of oral metoprolol with an additional 5 mg of intravenous metoprolol.  Continuing to monitor patient closely on telemetry.  Sherryll Burger Merrin Mcvicker

## 2021-05-07 NOTE — TOC Progression Note (Addendum)
Transition of Care [facility]) - Progression Note    Patient Details  Name: JANEL BEANE MRN: [id number] Date of Birth: 1922-06-16  Transition of Care [facility]) CM/SW [place], [place] Phone Number: 05/07/2021, 11:40 AM  Clinical Narrative:     Update: CSW received callback from [place]. [facility] when medically ready. CSW spoke with Sharyn Lull at [facility] who confirmed they can accept patient tomorrow if medically ready. CSW will start insurance authorization for patient for start date of 12/16. Reference number is # [id number]. Insurance authorization pending.CSW will continue to follow.  CSW spoke with patient at bedside and provided SNF bed offers. Patient initially chose [facility]. Star with [facility] informed CSW that there has been a covid outbreak there but they are still taking admissions and are moving all covid + to one unit, and may can accept patient for dc tomorrow if medically ready. CSW informed patient. Patient has decided to go with another SNF bed offer. Patient is reviewing medicare compare ratings list with accepted SNF bed offers. Patient request for CSW to call her nephew bill to help assist with SNF choice for patient. CSW called patients nephew Rush Landmark and Rush Landmark is going to review SNF bed offers on Medicare compare ratings list online. Rush Landmark will call CSW back with SNF choice shortly. CSW will continue to follow and assist with dc planning needs.  Expected Discharge Plan: [place] Barriers to Discharge: Continued Medical Work up  Expected Discharge Plan and Services Expected Discharge Plan: [place] arrangements for the past 2 months: Apartment                                       Social Determinants of Health (SDOH) Interventions    Readmission Risk Interventions No flowsheet data found.

## 2021-05-07 NOTE — Progress Notes (Signed)
PROGRESS NOTE    Tara Bradley  MOL:[id number] DOB: 02-13-1923 DOA: 05/02/2021 PCP: [name], MD   Chief Complain: Right shoulder pain  Brief Narrative: Patient is a 87 year old female with history of chronic A. fib on Xarelto, hypertension, hyperlipidemia, insomnia who presented here with complaints of right shoulder pain after falling at home.  She was also found to be in rapid A. fib in presentation.  BP was soft and she was hypotensive.  X-ray showed severely displaced fracture of the right humerus with old acromial fracture.  She underwent reverse shoulder arthroplasty on 12/12.  Hospital was remarkable for A. fib.  Cardiology consulted and following.  Still on A. fib on RVR as per this mrng.  PT/OT recommended skilled nursing facility on discharge.  Assessment & Plan:   Principal Problem:   Right humeral fracture Active Problems:   DM type 2, controlled, with complication (HCC)   Persistent atrial fibrillation with rapid ventricular response (HCC)   Osteoporosis   Chronic anticoagulation   Hyperlipidemia   Chronic insomnia   Chronic congestive heart failure with left ventricular diastolic dysfunction (HCC)   Acute on chronic diastolic heart failure ([place])   Fall at home, initial encounter   Atrial fibrillation with RVR ([place])   S/P shoulder replacement, right   Chronic A. Fib/rapid A. fib: Still on rapid A. fib this morning.  Cardiology following.  On digoxin, Cardizem, metoprolol.  Dose of metoprolol increased.  Continue to monitor on telemetry.  On Xarelto for anticoagulation.  Traumatic acute displaced, closed right humerus fracture: Status post reverse shoulder arthroplasty on 12/12 by Dr. Alma Friendly.  Continue pain management, supportive care.  Follow-up with orthopedics as an outpatient.  No weightbearing  Acute on chronic diastolic congestive heart failure: Volume overloaded after surgery.  Started on Lasix IV 40 mg daily.  Cardiology following. Losartan on hold due  to soft blood pressure. Continue to monitor daily output, input, weight  Acute hypoxic respiratory failure: She was on 2 L of oxygen ,now weaned to room air.  Thought to be secondary to fluid overload/atelectasis.  On iv diuresis.  .  Not on oxygen at home. CXR on 12/14 showed small right pleural effusion and right greater than left basilar airspace disease: Likely atelectasis, no evidence of pneumonia clinically  Type 2 diabetes: A1c of 8.6 indicating poor control.  On metformin at home.  Continue current insulin regimen.  Monitor blood sugars  Hypokalemia: Being supplemented and monitored  History of aortic dissection: Chronic.  Stable  Insomnia: Trazodone  Fall/deconditioning/debility/advanced age: PT/OT  recommended skilled nursing facility on discharge.         DVT prophylaxis:Xarelto Code Status:Full  Family Communication: None at bedside Patient status:Inpatient  Dispo: The patient is from: Independent living facilty              Anticipated d/c is to:  SNF              Anticipated d/c date is: In 1-2 days  Consultants: Cardiology, orthopedics  Procedures:Arthoplasty  Antimicrobials:  Anti-infectives (From admission, onward)    Start     Dose/Rate Route Frequency Ordered Stop   05/04/21 2300  ceFAZolin (ANCEF) IVPB 2g/100 mL premix        2 g 200 mL/hr over 30 Minutes Intravenous Every 6 hours 05/04/21 2052 05/05/21 1324   05/04/21 0630  ceFAZolin (ANCEF) IVPB 2g/100 mL premix        2 g 200 mL/hr over 30 Minutes Intravenous On call to O.R.  05/03/21 1958 05/04/21 1650       Subjective:  Patient seen and examined at the bedside this morning.  She was still in A. fib with RVR this morning.  Denies any palpitations or chest pain or shortness of breath.  No new complaints today  Objective: Vitals:   05/06/21 2120 05/06/21 2128 05/07/21 0020 05/07/21 0424  BP: (!) 130/92 (!) 130/92 (!) 160/94 (!) 142/91  Pulse: (!) 146 (!) 138  (!) 150  Resp: 19  20 20    Temp:   99 F (37.2 C) 98.8 F (37.1 C)  TempSrc:   Oral Oral  SpO2: 96%  100% 96%  Weight:    68.7 kg  Height:        Intake/Output Summary (Last 24 hours) at 05/07/2021 0737 Last data filed at 05/06/2021 1039 Gross per 24 hour  Intake --  Output 400 ml  Net -400 ml   Filed Weights   05/05/21 0530 05/06/21 0500 05/07/21 0424  Weight: 66 kg 70.3 kg 68.7 kg    Examination:   General exam: Overall comfortable, not in distress, pleasant elderly female, deconditioned HEENT: PERRL Respiratory system:  no wheezes or crackles  Cardiovascular system: Afib with RVR  Gastrointestinal system: Abdomen is nondistended, soft and nontender. Central nervous system: Alert and oriented Extremities: No edema, no clubbing ,no cyanosis, sling on the right Skin: No rashes, no ulcers,no icterus    Data Reviewed: I have personally reviewed following labs and imaging studies  CBC: Recent Labs  Lab 05/02/21 0506 05/03/21 0340 05/04/21 0105 05/05/21 0413 05/06/21 0310 05/07/21 0126  WBC 9.0 6.4 6.6 9.7 9.9 7.5  NEUTROABS 6.4  --   --   --   --   --   HGB 14.2 11.6* 11.2* 11.4* 10.5* 11.1*  HCT 42.8 34.7* 33.3* 34.8* 31.7* 33.6*  MCV 95.5 96.7 96.8 97.2 96.6 98.2  PLT 190 157 153 170 185 932   Basic Metabolic Panel: Recent Labs  Lab 05/02/21 0506 05/03/21 0340 05/04/21 0105 05/05/21 0413 05/06/21 0310 05/07/21 0126  NA 136 132* 133* 134* 132* 135  K 3.2* 4.2 4.1 5.0 4.3 4.5  CL 97* 100 100 101 97* 101  CO2 30 23 23 22 26 27   GLUCOSE 228* 190* 154* 313* 238* 202*  BUN 24* 17 17 11 18 23   CREATININE 0.90 0.68 0.70 0.83 0.76 0.83  CALCIUM 9.5 7.9* 8.2* 8.4* 8.6* 8.4*  MG 1.9  --   --   --   --   --    GFR: Estimated Creatinine Clearance: 36.9 mL/min (by C-G formula based on SCr of 0.83 mg/dL). Liver Function Tests: No results for input(s): AST, ALT, ALKPHOS, BILITOT, PROT, ALBUMIN in the last 168 hours. No results for input(s): LIPASE, AMYLASE in the last 168 hours. No  results for input(s): AMMONIA in the last 168 hours. Coagulation Profile: No results for input(s): INR, PROTIME in the last 168 hours. Cardiac Enzymes: No results for input(s): CKTOTAL, CKMB, CKMBINDEX, TROPONINI in the last 168 hours. BNP (last 3 results) No results for input(s): PROBNP in the last 8760 hours. HbA1C: No results for input(s): HGBA1C in the last 72 hours. CBG: Recent Labs  Lab 05/05/21 2133 05/06/21 0726 05/06/21 1132 05/06/21 1606 05/06/21 2157  GLUCAP 264* 217* 384* 251* 227*   Lipid Profile: No results for input(s): CHOL, HDL, LDLCALC, TRIG, CHOLHDL, LDLDIRECT in the last 72 hours. Thyroid Function Tests: No results for input(s): TSH, T4TOTAL, FREET4, T3FREE, THYROIDAB in the last 72  hours.  Anemia Panel: No results for input(s): VITAMINB12, FOLATE, FERRITIN, TIBC, IRON, RETICCTPCT in the last 72 hours. Sepsis Labs: No results for input(s): PROCALCITON, LATICACIDVEN in the last 168 hours.  Recent Results (from the past 240 hour(s))  Resp Panel by RT-PCR (Flu A&B, Covid) Nasopharyngeal Swab     Status: None   Collection Time: 05/02/21  8:49 AM   Specimen: Nasopharyngeal Swab; Nasopharyngeal(NP) swabs in vial transport medium  Result Value Ref Range Status   SARS Coronavirus 2 by RT PCR NEGATIVE NEGATIVE Final    Comment: (NOTE) SARS-CoV-2 target nucleic acids are NOT DETECTED.  The SARS-CoV-2 RNA is generally detectable in upper respiratory specimens during the acute phase of infection. The lowest concentration of SARS-CoV-2 viral copies this assay can detect is 138 copies/mL. A negative result does not preclude SARS-Cov-2 infection and should not be used as the sole basis for treatment or other patient management decisions. A negative result may occur with  improper specimen collection/handling, submission of specimen other than nasopharyngeal swab, presence of viral mutation(s) within the areas targeted by this assay, and inadequate number of  viral copies(<138 copies/mL). A negative result must be combined with clinical observations, patient history, and epidemiological information. The expected result is Negative.  Fact Sheet for Patients:  EntrepreneurPulse.com.au  Fact Sheet for Healthcare Providers:  IncredibleEmployment.be  This test is no t yet approved or cleared by the [place] FDA and  has been authorized for detection and/or diagnosis of SARS-CoV-2 by FDA under an Emergency Use Authorization (EUA). This EUA will remain  in effect (meaning this test can be used) for the duration of the COVID-19 declaration under Section 564(b)(1) of the Act, 21 U.S.C.section 360bbb-3(b)(1), unless the authorization is terminated  or revoked sooner.       Influenza A by PCR NEGATIVE NEGATIVE Final   Influenza B by PCR NEGATIVE NEGATIVE Final    Comment: (NOTE) The Xpert Xpress SARS-CoV-2/FLU/RSV plus assay is intended as an aid in the diagnosis of influenza from Nasopharyngeal swab specimens and should not be used as a sole basis for treatment. Nasal washings and aspirates are unacceptable for Xpert Xpress SARS-CoV-2/FLU/RSV testing.  Fact Sheet for Patients: EntrepreneurPulse.com.au  Fact Sheet for Healthcare Providers: IncredibleEmployment.be  This test is not yet approved or cleared by the [place] FDA and has been authorized for detection and/or diagnosis of SARS-CoV-2 by FDA under an Emergency Use Authorization (EUA). This EUA will remain in effect (meaning this test can be used) for the duration of the COVID-19 declaration under Section 564(b)(1) of the Act, 21 U.S.C. section 360bbb-3(b)(1), unless the authorization is terminated or revoked.  Performed at [facility], [place] [address] [place], [place] [address]   Surgical PCR screen     Status: None   Collection Time: 05/04/21  8:28 AM   Specimen: Nasal Mucosa; Nasal Swab   Result Value Ref Range Status   MRSA, PCR NEGATIVE NEGATIVE Final   Staphylococcus aureus NEGATIVE NEGATIVE Final    Comment: (NOTE) The Xpert SA Assay (FDA approved for NASAL specimens in patients 67 years of age and older), is one component of a comprehensive surveillance program. It is not intended to diagnose infection nor to guide or monitor treatment. Performed at [facility], [place] [address] [place], [place] [address]          Radiology Studies: DG CHEST PORT 1 VIEW  Result Date: 05/06/2021 CLINICAL DATA:  Shortness of breath following shoulder surgery. EXAM: PORTABLE CHEST  1 VIEW COMPARISON:  One-view chest x-ray 05/02/2021.  CTA chest 03/12/17 FINDINGS: Heart is enlarged. Atherosclerotic changes are noted in the arch. Scoliosis is present. A small right pleural effusion is present. Right greater than left basilar airspace disease is present. No other significant airspace disease is present. Bilateral rib fractures noted. Patient is status post right shoulder arthroplasty. Fluid gas are present in the joint space. IMPRESSION: Small right pleural effusion and right greater than left basilar airspace disease. While this likely reflects atelectasis, infection is not excluded. Electronically Signed   By: San Morelle M.D.   On: 05/06/2021 12:46        Scheduled Meds:  digoxin  0.0625 mg Oral Daily   diltiazem  300 mg Oral Daily   docusate sodium  100 mg Oral BID   furosemide  40 mg Intravenous Daily   insulin aspart  0-6 Units Subcutaneous TID WC   insulin detemir  5 Units Subcutaneous Daily   metoprolol tartrate  25 mg Oral BID   polyethylene glycol  17 g Oral Daily   potassium chloride  10 mEq Oral Daily   rivaroxaban  15 mg Oral Q supper   senna-docusate  1 tablet Oral BID   sodium chloride flush  3 mL Intravenous Q12H   Continuous Infusions:  sodium chloride 10 mL/hr at 05/05/21 0321     LOS: 5 days    Time spent:25 mins. More than 50% of that  time was spent in counseling and/or coordination of care.      Shelly Coss, MD [facility] P12/15/2022, 7:37 AM

## 2021-05-07 NOTE — Progress Notes (Signed)
°   05/07/21 2124  Assess: MEWS Score  Temp 98.2 F (36.8 C)  BP (!) 150/88  Pulse Rate (!) 138  ECG Heart Rate (!) 138  Resp 17  SpO2 100 %  Assess: MEWS Score  MEWS Temp 0  MEWS Systolic 0  MEWS Pulse 3  MEWS RR 0  MEWS LOC 0  MEWS Score 3  MEWS Score Color Yellow  Assess: if the MEWS score is Yellow or Red  Were vital signs taken at a resting state? Yes  Focused Assessment No change from prior assessment  Early Detection of Sepsis Score *See Row Information* Low  MEWS guidelines implemented *See Row Information* Yes  Treat  MEWS Interventions Escalated (See documentation below)  Take Vital Signs  Increase Vital Sign Frequency  Yellow: Q 2hr X 2 then Q 4hr X 2, if remains yellow, continue Q 4hrs  Escalate  MEWS: Escalate Yellow: discuss with charge nurse/RN and consider discussing with provider and RRT  Notify: Charge Nurse/RN  Name of Charge Nurse/RN Notified Hinton Dyer RN  Date Charge Nurse/RN Notified 05/07/21  Time Charge Nurse/RN Notified 2212  Document  Patient Outcome Not stable and remains on department  Progress note created (see row info) Yes

## 2021-05-08 LAB — RENAL FUNCTION PANEL
Albumin: 2.9 g/dL — ABNORMAL LOW (ref 3.5–5.0)
Anion gap: 9 (ref 5–15)
BUN: 19 mg/dL (ref 8–23)
CO2: 27 mmol/L (ref 22–32)
Calcium: 8.7 mg/dL — ABNORMAL LOW (ref 8.9–10.3)
Chloride: 97 mmol/L — ABNORMAL LOW (ref 98–111)
Creatinine, Ser: 0.74 mg/dL (ref 0.44–1.00)
GFR, Estimated: >60 mL/min (ref >60)
Glucose, Bld: 251 mg/dL — ABNORMAL HIGH (ref 70–99)
Phosphorus: 2.7 mg/dL (ref 2.5–4.6)
Potassium: 4.2 mmol/L (ref 3.5–5.1)
Sodium: 133 mmol/L — ABNORMAL LOW (ref 135–145)

## 2021-05-08 LAB — GLUCOSE, CAPILLARY
Glucose-Capillary: 354 mg/dL — ABNORMAL HIGH (ref 70–99)
Glucose-Capillary: 237 mg/dL — ABNORMAL HIGH (ref 70–99)
Glucose-Capillary: 232 mg/dL — ABNORMAL HIGH (ref 70–99)
Glucose-Capillary: 218 mg/dL — ABNORMAL HIGH (ref 70–99)
Glucose-Capillary: 275 mg/dL — ABNORMAL HIGH (ref 70–99)

## 2021-05-08 MED ORDER — FUROSEMIDE 40 MG PO TABS
40.0000 mg | ORAL_TABLET | Freq: Every day | ORAL | Status: DC
  Administered 2021-05-08 – 2021-05-09 (×2): 40 mg via ORAL
  Filled 2021-05-08 (×2): qty 1

## 2021-05-08 MED ORDER — DILTIAZEM HCL-DEXTROSE 125-5 MG/125ML-% IV SOLN (PREMIX)
5.0000 mg/h | INTRAVENOUS | Status: DC
  Administered 2021-05-08: 12.5 mg/h via INTRAVENOUS
  Administered 2021-05-08: 5 mg/h via INTRAVENOUS
  Administered 2021-05-09: 12.5 mg/h via INTRAVENOUS
  Administered 2021-05-09 – 2021-05-11 (×6): 15 mg/h via INTRAVENOUS
  Filled 2021-05-08 (×8): qty 125

## 2021-05-08 MED ORDER — DILTIAZEM HCL 60 MG PO TABS: 90.0000 mg | ORAL_TABLET | Freq: Three times a day (TID) | ORAL | Status: DC

## 2021-05-08 NOTE — Progress Notes (Signed)
Physical Therapy Treatment Patient Details Name: Tara Bradley MRN: [id number] DOB: September 09, 1922 Today's Date: 05/08/2021   History of Present Illness This 87 y.o. female admitted 05/02/21 after sustaining a fall.  She does endorse hitting her head, and was found to have severely displaced proximal right humeral neck fracture of indeterminate age along with old displaced acromial fracture.   She was also found to have A-Fib with RVR with rates in 150s and hypoxia with ambulation requiring 2L 02. s/p 05/04/21 R total reverse arthroplasty. PMH includes: arthritis, aortic valve stenosis, bradycardia, DM, HTN, A-Fib, syncope, ho ulnar fx    PT Comments    Pt requesting to get back to bed after sitting up in recliner most of the day. Pt limited in safe mobility by continued Afib with RVR, decreased R UE use impacting balance and generalized weakness. Pt is modA for sit>stand from recliner and min A for ambulation pushing IV pole and returning to bed. HR elevated throughout 130-high 150s, minimally sustained in 150s with movement. D/c plans remain appropriate at this time. PT will continue to follow acutely.   Recommendations for follow up therapy are one component of a multi-disciplinary discharge planning process, led by the attending physician.  Recommendations may be updated based on patient status, additional functional criteria and insurance authorization.  Follow Up Recommendations  Skilled nursing-short term rehab (<3 hours/day)     Assistance Recommended at Discharge    Equipment Recommendations  Other (comment) (TBA)       Precautions / Restrictions Precautions Precautions: Fall Precaution Comments: displaced R humerus fx; monitor HR Required Braces or Orthoses: Sling Restrictions Weight Bearing Restrictions: No RUE Weight Bearing: Non weight bearing Other Position/Activity Restrictions: can use R UE to guide RW not bear weight, gentle ADLs per ortho MD     Mobility  Bed  Mobility Overal bed mobility: Needs Assistance Bed Mobility: Sit to Supine       Sit to supine: Min assist   General bed mobility comments: pt able to pull LE back into bed but needed assist to square hips and shoulders and to pull up in the bed    Transfers Overall transfer level: Needs assistance Equipment used: 1 person hand held assist Transfers: Sit to/from Stand Sit to Stand: Mod assist           General transfer comment: modA for power up from the side of the recliner.    Ambulation/Gait Ambulation/Gait assistance: Min Web designer (Feet): 20 Feet Assistive device: IV Pole Gait Pattern/deviations: Step-through pattern;Decreased stride length Gait velocity: reduced Gait velocity interpretation: <1.31 ft/sec, indicative of household ambulator   General Gait Details: minA for steadying with pushing IV pole to the door and back, vc for managing lines and navigation around obstacles in room.          Balance Overall balance assessment: Needs assistance Sitting-balance support: Feet supported;Single extremity supported;No upper extremity supported Sitting balance-Leahy Scale: Good     Standing balance support: Single extremity supported;During functional activity Standing balance-Leahy Scale: Poor Standing balance comment: able to static stand without assist however requires L UE for dynamic activity                            Cognition Arousal/Alertness: Awake/alert Behavior During Therapy: WFL for tasks assessed/performed Overall Cognitive Status: Within Functional Limits for tasks assessed  General Comments General comments (skin integrity, edema, etc.): HR 135-156 at rest and with ambulation no obvious change in Afib range. SpO2 on RA >94%O2 with ambulation      Pertinent Vitals/Pain Pain Assessment: Faces Faces Pain Scale: Hurts a little bit Pain Location: R UE Pain  Descriptors / Indicators: Grimacing;Guarding Pain Intervention(s): Limited activity within patient's tolerance;Monitored during session;Repositioned     PT Goals (current goals can now be found in the care plan section) Acute Rehab PT Goals PT Goal Formulation: With patient Time For Goal Achievement: 05/17/21 Potential to Achieve Goals: Good Progress towards PT goals: Not progressing toward goals - comment (limited by increased HR)    Frequency    Min 3X/week      PT Plan Discharge plan needs to be updated       AM-PAC PT "6 Clicks" Mobility   Outcome Measure  Help needed turning from your back to your side while in a flat bed without using bedrails?: A Little Help needed moving from lying on your back to sitting on the side of a flat bed without using bedrails?: A Little Help needed moving to and from a bed to a chair (including a wheelchair)?: A Little Help needed standing up from a chair using your arms (e.g., wheelchair or bedside chair)?: A Little Help needed to walk in hospital room?: A Little Help needed climbing 3-5 steps with a railing? : A Lot 6 Click Score: 17    End of Session   Activity Tolerance: Patient tolerated treatment well Patient left: with call bell/phone within reach;in bed;with bed alarm set Nurse Communication: Mobility status PT Visit Diagnosis: Other abnormalities of gait and mobility (R26.89);History of falling (Z91.81);Difficulty in walking, not elsewhere classified (R26.2);Pain Pain - Right/Left: Right Pain - part of body: Arm     Time: 5520-8022 PT Time Calculation (min) (ACUTE ONLY): 21 min  Charges:  $Therapeutic Activity: 8-22 mins                     Naryiah Schley B. Migdalia Dk PT, DPT Acute Rehabilitation Services Pager [phone] Office [phone]    [place] 05/08/2021, 3:45 PM

## 2021-05-08 NOTE — TOC Progression Note (Addendum)
Transition of Care [facility]) - Progression Note    Patient Details  Name: Tara Bradley MRN: [id number] Date of Birth: [birth year]  [facility]) CM/SW [place], [place] Phone Number: 05/08/2021, 12:02 PM  Clinical Narrative:     Patients insurance authorization is pending. Patient has SN[facility] bed [facility] when medically for dc. CSW informed patient and patients nephew Tara Bradley.CSW will continue to follow and assist with patients dc planning needs.  Expected Discharge Plan: [place] Barriers to Discharge: Continued Medical Work up  Expected Discharge Plan and Services Expected Discharge Plan: [place] arrangements for the past 2 months: Apartment                                       Social Determinants of Health (SDOH) Interventions    Readmission Risk Interventions No flowsheet data found.

## 2021-05-08 NOTE — Progress Notes (Signed)
Progress Note  Patient Name: Tara Bradley Date of Encounter: 05/08/2021  Primary Cardiologist:   [name], MD   Subjective   Incomplete I's and O's.  Weight is down 7 pounds from yesterday, but elevated 4 pounds from 12/10.  She reports dyspnea is significantly improved.  Continues to be in Afib with RVR   Inpatient Medications    Scheduled Meds:  digoxin  0.0625 mg Oral Daily   docusate sodium  100 mg Oral BID   insulin aspart  0-6 Units Subcutaneous TID AC & HS   insulin detemir  5 Units Subcutaneous Daily   metoprolol succinate  50 mg Oral BID   polyethylene glycol  17 g Oral Daily   potassium chloride  10 mEq Oral Daily   rivaroxaban  15 mg Oral Q supper   senna-docusate  1 tablet Oral BID   sodium chloride flush  3 mL Intravenous Q12H   Continuous Infusions:  sodium chloride 10 mL/hr at 05/05/21 0321   diltiazem (CARDIZEM) infusion     PRN Meds: sodium chloride, acetaminophen **OR** acetaminophen, albuterol, bisacodyl, HYDROcodone-acetaminophen, menthol-cetylpyridinium **OR** phenol, metoCLOPramide **OR** metoCLOPramide (REGLAN) injection, morphine injection, ondansetron **OR** ondansetron (ZOFRAN) IV, polyethylene glycol, traZODone   Vital Signs    Vitals:   05/08/21 0459 05/08/21 0500 05/08/21 0747 05/08/21 0841  BP: (!) 133/93  (!) 130/97 (!) 120/96  Pulse: (!) 140  (!) 157 (!) 150  Resp: 17  17   Temp: 98.3 F (36.8 C)  99.5 F (37.5 C)   TempSrc: Oral  Oral   SpO2: 91%  92%   Weight:  65.3 kg    Height:        Intake/Output Summary (Last 24 hours) at 05/08/2021 0944 Last data filed at 05/08/2021 0500 Gross per 24 hour  Intake --  Output 1450 ml  Net -1450 ml    Filed Weights   05/06/21 0500 05/07/21 0424 05/08/21 0500  Weight: 70.3 kg 68.7 kg 65.3 kg    Telemetry    Atrial fib with rates 100-150s- Personally Reviewed  ECG    NA - Personally Reviewed  Physical Exam   GEN: No acute distress.   Neck: mild JVD Cardiac:  Irregular RR, no murmurs, rubs, or gallops.  Respiratory: CTAB GI: Soft, nontender, non-distended  MS: No  edema; No deformity. Neuro:  Nonfocal  Psych: Normal affect   Labs    Chemistry Recent Labs  Lab 05/06/21 0310 05/07/21 0126 05/08/21 0747  NA 132* 135 133*  K 4.3 4.5 4.2  CL 97* 101 97*  CO2 26 27 27   GLUCOSE 238* 202* 251*  BUN 18 23 19   CREATININE 0.76 0.83 0.74  CALCIUM 8.6* 8.4* 8.7*  ALBUMIN  --   --  2.9*  GFRNONAA >60 >60 >60  ANIONGAP 9 7 9       Hematology Recent Labs  Lab 05/05/21 0413 05/06/21 0310 05/07/21 0126  WBC 9.7 9.9 7.5  RBC 3.58* 3.28* 3.42*  HGB 11.4* 10.5* 11.1*  HCT 34.8* 31.7* 33.6*  MCV 97.2 96.6 98.2  MCH 31.8 32.0 32.5  MCHC 32.8 33.1 33.0  RDW 13.7 14.1 14.6  PLT 170 185 180     Cardiac EnzymesNo results for input(s): TROPONINI in the last 168 hours. No results for input(s): TROPIPOC in the last 168 hours.   BNP Recent Labs  Lab 05/07/21 0126  BNP 124.6*      DDimer No results for input(s): DDIMER in the last 168 hours.   Radiology  DG CHEST PORT 1 VIEW  Result Date: 05/06/2021 CLINICAL DATA:  Shortness of breath following shoulder surgery. EXAM: PORTABLE CHEST 1 VIEW COMPARISON:  One-view chest x-ray 05/02/2021.  CTA chest 03/12/17 FINDINGS: Heart is enlarged. Atherosclerotic changes are noted in the arch. Scoliosis is present. A small right pleural effusion is present. Right greater than left basilar airspace disease is present. No other significant airspace disease is present. Bilateral rib fractures noted. Patient is status post right shoulder arthroplasty. Fluid gas are present in the joint space. IMPRESSION: Small right pleural effusion and right greater than left basilar airspace disease. While this likely reflects atelectasis, infection is not excluded. Electronically Signed   By: San Morelle M.D.   On: 05/06/2021 12:46    Cardiac Studies   None   Patient Profile     87 y.o. female with a hx  of with a history of an aortic thrombosed descending aortic dissection, aortic valve sclerosis and chronic afib on Xarelto who is being seen for the evaluation of atrial fib      Assessment & Plan    ATRIAL FIB:  home regimen is diltiazem 300 mg daily and metoprolol 25 mg twice daily and digoxin.  She is on Xarelto for anticoagulation -Continue Xarelto -Metoprolol increased to 50 mg XL twice daily.  Rates remain significantly elevated.  BP stable.  She is starting to feel fatigued.  Would start diltiazem drip to control her rates as she recovers from her surgery. -Can continue digoxin, will check level   ACUTE ON CHRONIC DIASTOLIC HEART FAILURE:   likely due to fluids received during surgery.  Chest x-ray with right pleural effusion.  Has improved with IV diuresis, now on room air.  Volume status improved and weight is back down near baseline.  We will transition to p.o. Lasix 40 mg daily   For questions or updates, please contact [place] Please consult [email or web address] for contact info under Cardiology/STEMI.   Signed, Donato Heinz, MD  05/08/2021, 9:44 AM

## 2021-05-08 NOTE — Progress Notes (Signed)
° °  Subjective: 4 Days Post-Op Procedure(s) (LRB): REVERSE SHOULDER ARTHROPLASTY (Right)  Pt doing fairly well Limited rom but no pain Plan for SNF placement  Denies any new symptoms or issues Patient reports pain as none.  Objective:   VITALS:   Vitals:   05/08/21 0841 05/08/21 1142  BP: (!) 120/96 (!) 132/116  Pulse: (!) 150 (!) 151  Resp:  16  Temp:  98.2 F (36.8 C)  SpO2:  95%    Right shoulder incision healing well Nv intact distally No rashes or edema distally Sling in good position  LABS Recent Labs    05/06/21 0310 05/07/21 0126  HGB 10.5* 11.1*  HCT 31.7* 33.6*  WBC 9.9 7.5  PLT 185 180    Recent Labs    05/06/21 0310 05/07/21 0126 05/08/21 0747  NA 132* 135 133*  K 4.3 4.5 4.2  BUN 18 23 19   CREATININE 0.76 0.83 0.74  GLUCOSE 238* 202* 251*     Assessment/Plan: 4 Days Post-Op Procedure(s) (LRB): REVERSE SHOULDER ARTHROPLASTY (Right) Overall pt doing well from the shoulder standpoint Clear for d/c to SNF from orthopedic standpoint F/u in 2 weeks in the office May use the right arm for ADLs without heavy lifting Will continue to monitor her progress     Merla Riches PA-C, [facility] [address] [place], [place], [place] [address] Phone: [phone] [email or web address]   [employer]

## 2021-05-08 NOTE — Progress Notes (Signed)
OT Cancellation Note  Patient Details Name: Tara Bradley MRN: [id number] DOB: 11/09/1922   Cancelled Treatment:    Reason Eval/Treat Not Completed: Medical issues which prohibited therapy. Hold OT today due to resting HE in 140s-160s. OT will follow up next available time as appropriate  Britt Bottom 05/08/2021, 11:19 AM

## 2021-05-08 NOTE — Progress Notes (Signed)
PROGRESS NOTE    Tara Bradley  FBP:[id number] DOB: [birth year] DOA: 05/02/2021 PCP: [name], MD   Chief Complain: Right shoulder pain  Brief Narrative: Patient is a 87 year old female with history of chronic A. fib on Xarelto, hypertension, hyperlipidemia, insomnia who presented here with complaints of right shoulder pain after falling at home.  She was also found to be in rapid A. fib in presentation.  BP was soft and she was hypotensive.  X-ray showed severely displaced fracture of the right humerus with old acromial fracture.  She underwent reverse shoulder arthroplasty on 12/12.  Hospital was remarkable for A. fib.  Cardiology consulted and following.  Still on A. fib on RVR as per this mrng.  PT/OT recommended skilled nursing facility on discharge.  Assessment & Plan:   Principal Problem:   Right humeral fracture Active Problems:   DM type 2, controlled, with complication (HCC)   Persistent atrial fibrillation with rapid ventricular response (HCC)   Osteoporosis   Chronic anticoagulation   Hyperlipidemia   Chronic insomnia   Chronic congestive heart failure with left ventricular diastolic dysfunction (HCC)   Acute on chronic diastolic heart failure ([place])   Fall at home, initial encounter   Atrial fibrillation with RVR ([place])   S/P shoulder replacement, right   Chronic A. Fib/rapid A. fib: Still on rapid A. fib this morning.  Cardiology following.  She was on digoxin, Cardizem, metoprolol.  Dose of metoprolol increased.  Started on Cardizem drip today.  On Xarelto for anticoagulation.  Traumatic acute displaced, closed right humerus fracture: Status post reverse shoulder arthroplasty on 12/12 by Dr. Alma Friendly.  Continue pain management, supportive care.  Follow-up with orthopedics as an outpatient.  No weightbearing  Acute on chronic diastolic congestive heart failure: Volume overloaded after surgery.  Started on Lasix IV 40 mg ,now on hold.  Cardiology following. Losartan  on hold due to soft blood pressure. Continue to monitor daily output, input, weight  Acute hypoxic respiratory failure: She was on 2 L of oxygen ,now weaned to room air.  Thought to be secondary to fluid overload/atelectasis.  On iv diuresis.  .  Not on oxygen at home. CXR on 12/14 showed small right pleural effusion and right greater than left basilar airspace disease: Likely atelectasis, no evidence of pneumonia clinically  Type 2 diabetes: A1c of 8.6 indicating poor control.  On metformin at home.  Continue current insulin regimen.  Monitor blood sugars  Hypokalemia: Being supplemented and monitored  History of aortic dissection: Chronic.  Stable  Insomnia: Trazodone  Fall/deconditioning/debility/advanced age: PT/OT  recommended skilled nursing facility on discharge.         DVT prophylaxis:Xarelto Code Status:Full  Family Communication: None at bedside Patient status:Inpatient  Dispo: The patient is from: Independent living facilty              Anticipated d/c is to:  SNF              Anticipated d/c date is: In 1-2 days  Consultants: Cardiology, orthopedics  Procedures:Arthoplasty  Antimicrobials:  Anti-infectives (From admission, onward)    Start     Dose/Rate Route Frequency Ordered Stop   05/04/21 2300  ceFAZolin (ANCEF) IVPB 2g/100 mL premix        2 g 200 mL/hr over 30 Minutes Intravenous Every 6 hours 05/04/21 2052 05/05/21 1324   05/04/21 0630  ceFAZolin (ANCEF) IVPB 2g/100 mL premix        2 g 200 mL/hr over 30 Minutes Intravenous  On call to O.R. 05/03/21 1958 05/04/21 1650       Subjective:  Patient seen and examined at the bedside this morning.  Hemodynamically stable, lying on the bed.  Still on A. fib with RVR.  Denies any chest pain or palpitations or shortness of breath.  Objective: Vitals:   05/08/21 0209 05/08/21 0459 05/08/21 0500 05/08/21 0747  BP: 121/88 (!) 133/93  (!) 130/97  Pulse: (!) 133 (!) 140  (!) 157  Resp: 20 17  17   Temp:  98.3 F (36.8 C) 98.3 F (36.8 C)  99.5 F (37.5 C)  TempSrc: Oral Oral  Oral  SpO2: 96% 91%  92%  Weight:   65.3 kg   Height:        Intake/Output Summary (Last 24 hours) at 05/08/2021 0801 Last data filed at 05/08/2021 0500 Gross per 24 hour  Intake --  Output 1450 ml  Net -1450 ml   Filed Weights   05/06/21 0500 05/07/21 0424 05/08/21 0500  Weight: 70.3 kg 68.7 kg 65.3 kg    Examination:  General exam: Pleasant elderly female, deconditioned HEENT: PERRL Respiratory system:  no wheezes or crackles  Cardiovascular system: A. fib with RVR Gastrointestinal system: Abdomen is nondistended, soft and nontender. Central nervous system: Alert and oriented Extremities: No edema, no clubbing ,no cyanosis, sling on the right upper extremity Skin: No rashes, no ulcers,no icterus    Data Reviewed: I have personally reviewed following labs and imaging studies  CBC: Recent Labs  Lab 05/02/21 0506 05/03/21 0340 05/04/21 0105 05/05/21 0413 05/06/21 0310 05/07/21 0126  WBC 9.0 6.4 6.6 9.7 9.9 7.5  NEUTROABS 6.4  --   --   --   --   --   HGB 14.2 11.6* 11.2* 11.4* 10.5* 11.1*  HCT 42.8 34.7* 33.3* 34.8* 31.7* 33.6*  MCV 95.5 96.7 96.8 97.2 96.6 98.2  PLT 190 157 153 170 185 498   Basic Metabolic Panel: Recent Labs  Lab 05/02/21 0506 05/03/21 0340 05/04/21 0105 05/05/21 0413 05/06/21 0310 05/07/21 0126  NA 136 132* 133* 134* 132* 135  K 3.2* 4.2 4.1 5.0 4.3 4.5  CL 97* 100 100 101 97* 101  CO2 30 23 23 22 26 27   GLUCOSE 228* 190* 154* 313* 238* 202*  BUN 24* 17 17 11 18 23   CREATININE 0.90 0.68 0.70 0.83 0.76 0.83  CALCIUM 9.5 7.9* 8.2* 8.4* 8.6* 8.4*  MG 1.9  --   --   --   --   --    GFR: Estimated Creatinine Clearance: 36 mL/min (by C-G formula based on SCr of 0.83 mg/dL). Liver Function Tests: No results for input(s): AST, ALT, ALKPHOS, BILITOT, PROT, ALBUMIN in the last 168 hours. No results for input(s): LIPASE, AMYLASE in the last 168 hours. No results  for input(s): AMMONIA in the last 168 hours. Coagulation Profile: No results for input(s): INR, PROTIME in the last 168 hours. Cardiac Enzymes: No results for input(s): CKTOTAL, CKMB, CKMBINDEX, TROPONINI in the last 168 hours. BNP (last 3 results) No results for input(s): PROBNP in the last 8760 hours. HbA1C: No results for input(s): HGBA1C in the last 72 hours. CBG: Recent Labs  Lab 05/06/21 2157 05/07/21 0757 05/07/21 1152 05/07/21 1545 05/07/21 2249  GLUCAP 227* 171* 341* 176* 279*   Lipid Profile: No results for input(s): CHOL, HDL, LDLCALC, TRIG, CHOLHDL, LDLDIRECT in the last 72 hours. Thyroid Function Tests: No results for input(s): TSH, T4TOTAL, FREET4, T3FREE, THYROIDAB in the last 72 hours.  Anemia Panel: No results for input(s): VITAMINB12, FOLATE, FERRITIN, TIBC, IRON, RETICCTPCT in the last 72 hours. Sepsis Labs: No results for input(s): PROCALCITON, LATICACIDVEN in the last 168 hours.  Recent Results (from the past 240 hour(s))  Resp Panel by RT-PCR (Flu A&B, Covid) Nasopharyngeal Swab     Status: None   Collection Time: 05/02/21  8:49 AM   Specimen: Nasopharyngeal Swab; Nasopharyngeal(NP) swabs in vial transport medium  Result Value Ref Range Status   SARS Coronavirus 2 by RT PCR NEGATIVE NEGATIVE Final    Comment: (NOTE) SARS-CoV-2 target nucleic acids are NOT DETECTED.  The SARS-CoV-2 RNA is generally detectable in upper respiratory specimens during the acute phase of infection. The lowest concentration of SARS-CoV-2 viral copies this assay can detect is 138 copies/mL. A negative result does not preclude SARS-Cov-2 infection and should not be used as the sole basis for treatment or other patient management decisions. A negative result may occur with  improper specimen collection/handling, submission of specimen other than nasopharyngeal swab, presence of viral mutation(s) within the areas targeted by this assay, and inadequate number of  viral copies(<138 copies/mL). A negative result must be combined with clinical observations, patient history, and epidemiological information. The expected result is Negative.  Fact Sheet for Patients:  EntrepreneurPulse.com.au  Fact Sheet for Healthcare Providers:  IncredibleEmployment.be  This test is no t yet approved or cleared by the [place] FDA and  has been authorized for detection and/or diagnosis of SARS-CoV-2 by FDA under an Emergency Use Authorization (EUA). This EUA will remain  in effect (meaning this test can be used) for the duration of the COVID-19 declaration under Section 564(b)(1) of the Act, 21 U.S.C.section 360bbb-3(b)(1), unless the authorization is terminated  or revoked sooner.       Influenza A by PCR NEGATIVE NEGATIVE Final   Influenza B by PCR NEGATIVE NEGATIVE Final    Comment: (NOTE) The Xpert Xpress SARS-CoV-2/FLU/RSV plus assay is intended as an aid in the diagnosis of influenza from Nasopharyngeal swab specimens and should not be used as a sole basis for treatment. Nasal washings and aspirates are unacceptable for Xpert Xpress SARS-CoV-2/FLU/RSV testing.  Fact Sheet for Patients: EntrepreneurPulse.com.au  Fact Sheet for Healthcare Providers: IncredibleEmployment.be  This test is not yet approved or cleared by the [place] FDA and has been authorized for detection and/or diagnosis of SARS-CoV-2 by FDA under an Emergency Use Authorization (EUA). This EUA will remain in effect (meaning this test can be used) for the duration of the COVID-19 declaration under Section 564(b)(1) of the Act, 21 U.S.C. section 360bbb-3(b)(1), unless the authorization is terminated or revoked.  Performed at [facility], [place] [address], [place], [place] [address]   Surgical PCR screen     Status: None   Collection Time: 05/04/21  8:28 AM   Specimen: Nasal Mucosa; Nasal Swab   Result Value Ref Range Status   MRSA, PCR NEGATIVE NEGATIVE Final   Staphylococcus aureus NEGATIVE NEGATIVE Final    Comment: (NOTE) The Xpert SA Assay (FDA approved for NASAL specimens in patients 62 years of age and older), is one component of a comprehensive surveillance program. It is not intended to diagnose infection nor to guide or monitor treatment. Performed at [facility], [place] [address], [place],  [address]          Radiology Studies: DG CHEST PORT 1 VIEW  Result Date: 05/06/2021 CLINICAL DATA:  Shortness of breath following shoulder surgery. EXAM: PORTABLE CHEST 1 VIEW  COMPARISON:  One-view chest x-ray 05/02/2021.  CTA chest 03/12/17 FINDINGS: Heart is enlarged. Atherosclerotic changes are noted in the arch. Scoliosis is present. A small right pleural effusion is present. Right greater than left basilar airspace disease is present. No other significant airspace disease is present. Bilateral rib fractures noted. Patient is status post right shoulder arthroplasty. Fluid gas are present in the joint space. IMPRESSION: Small right pleural effusion and right greater than left basilar airspace disease. While this likely reflects atelectasis, infection is not excluded. Electronically Signed   By: San Morelle M.D.   On: 05/06/2021 12:46        Scheduled Meds:  digoxin  0.0625 mg Oral Daily   diltiazem  300 mg Oral Daily   docusate sodium  100 mg Oral BID   insulin aspart  0-6 Units Subcutaneous TID AC & HS   insulin detemir  5 Units Subcutaneous Daily   metoprolol succinate  50 mg Oral BID   polyethylene glycol  17 g Oral Daily   potassium chloride  10 mEq Oral Daily   rivaroxaban  15 mg Oral Q supper   senna-docusate  1 tablet Oral BID   sodium chloride flush  3 mL Intravenous Q12H   Continuous Infusions:  sodium chloride 10 mL/hr at 05/05/21 0321     LOS: 6 days    Time spent:25 mins. More than 50% of that time was spent in counseling  and/or coordination of care.      Shelly Coss, MD [facility] P12/16/2022, 8:01 AM

## 2021-05-08 NOTE — Progress Notes (Signed)
PT Cancellation Note  Patient Details Name: LOYCE FLAMING MRN: [id number] DOB: 02-14-1923   Cancelled Treatment:    Reason Eval/Treat Not Completed: Medical issues which prohibited therapy Pt HR at rest 140-160s. PT will follow back later this afternoon as able.   Waylen Depaolo B. Migdalia Dk PT, DPT Acute Rehabilitation Services Pager [phone] Office [phone]  [place] 05/08/2021, 12:43 PM

## 2021-05-09 LAB — HEMOGLOBIN AND HEMATOCRIT, BLOOD
HCT: 31.2 % — ABNORMAL LOW (ref 36.0–46.0)
Hemoglobin: 10.7 g/dL — ABNORMAL LOW (ref 12.0–15.0)

## 2021-05-09 LAB — DIGOXIN LEVEL: Digoxin Level: 0.3 ng/mL — ABNORMAL LOW (ref 0.8–2.0)

## 2021-05-09 LAB — GLUCOSE, CAPILLARY
Glucose-Capillary: 214 mg/dL — ABNORMAL HIGH (ref 70–99)
Glucose-Capillary: 345 mg/dL — ABNORMAL HIGH (ref 70–99)
Glucose-Capillary: 258 mg/dL — ABNORMAL HIGH (ref 70–99)
Glucose-Capillary: 233 mg/dL — ABNORMAL HIGH (ref 70–99)

## 2021-05-09 MED ORDER — INSULIN DETEMIR 100 UNIT/ML ~~LOC~~ SOLN
10.0000 [IU] | Freq: Once | SUBCUTANEOUS | Status: AC
  Administered 2021-05-09: 10 [IU] via SUBCUTANEOUS
  Filled 2021-05-09: qty 0.1

## 2021-05-09 MED ORDER — DIGOXIN 125 MCG PO TABS
0.1250 mg | ORAL_TABLET | Freq: Every day | ORAL | Status: DC
  Administered 2021-05-10 – 2021-05-11 (×2): 0.125 mg via ORAL
  Filled 2021-05-09 (×2): qty 1

## 2021-05-09 MED ORDER — METOPROLOL SUCCINATE ER 25 MG PO TB24
25.0000 mg | ORAL_TABLET | Freq: Once | ORAL | Status: AC
  Administered 2021-05-09: 25 mg via ORAL
  Filled 2021-05-09: qty 1

## 2021-05-09 MED ORDER — FUROSEMIDE 20 MG PO TABS
20.0000 mg | ORAL_TABLET | Freq: Every day | ORAL | Status: DC
  Administered 2021-05-10 – 2021-05-11 (×2): 20 mg via ORAL
  Filled 2021-05-09 (×2): qty 1

## 2021-05-09 MED ORDER — METOPROLOL SUCCINATE ER 50 MG PO TB24
75.0000 mg | ORAL_TABLET | Freq: Two times a day (BID) | ORAL | Status: DC
  Administered 2021-05-09 – 2021-05-11 (×4): 75 mg via ORAL
  Filled 2021-05-09 (×4): qty 1

## 2021-05-09 MED ORDER — INSULIN ASPART 100 UNIT/ML IJ SOLN
0.0000 [IU] | Freq: Three times a day (TID) | INTRAMUSCULAR | Status: DC
  Administered 2021-05-09 – 2021-05-10 (×2): 8 [IU] via SUBCUTANEOUS
  Administered 2021-05-10: 17:00:00 3 [IU] via SUBCUTANEOUS
  Administered 2021-05-11: 08:00:00 15 [IU] via SUBCUTANEOUS

## 2021-05-09 MED ORDER — INSULIN DETEMIR 100 UNIT/ML ~~LOC~~ SOLN
15.0000 [IU] | Freq: Every day | SUBCUTANEOUS | Status: DC
  Filled 2021-05-09: qty 0.15

## 2021-05-09 NOTE — Progress Notes (Signed)
Regarding post-op bruising: 1) checking H/H to make sure this is stable 2) doesn't sound like compartment syndrome: good pulse and cap refill in arm, etc. 3) RN to notify ortho

## 2021-05-09 NOTE — Progress Notes (Signed)
Pt ambulated on a hallway with assistance.  Became SOB and rested a while in a hallway. HR remained in 100s-130s while ambulating.  Her right arm is edematous and discolored.  Educated patient to keep her arm elevated and apply ice pack.  Idolina Primer, RN

## 2021-05-09 NOTE — TOC Progression Note (Signed)
Transition of Care [facility]) - Progression Note    Patient Details  Name: Tara Bradley MRN: [id number] Date of Birth: 1922/11/26  Transition of Care [facility]) CM/SW [place], LCSW Phone Number:[phone] 05/09/2021, 11:48 AM  Clinical Narrative:     Pt's authoriztion has been approved. Auth dates 12/16-12/20/2022. MD alerted about authorization and COVID needed prior to dc.  TOC team will continue to assist with discharge planning needs.   Expected Discharge Plan: [place] Barriers to Discharge: Continued Medical Work up  Expected Discharge Plan and Services Expected Discharge Plan: [place] arrangements for the past 2 months: Apartment                                       Social Determinants of Health (SDOH) Interventions    Readmission Risk Interventions No flowsheet data found.

## 2021-05-09 NOTE — Progress Notes (Signed)
PROGRESS NOTE    Tara Bradley  HLK:[id number] DOB: Aug 23, 1922 DOA: 05/02/2021 PCP: [name], MD   Chief Complain: Right shoulder pain  Brief Narrative: Patient is a 87 year old female with history of chronic A. fib on Xarelto, hypertension, hyperlipidemia, insomnia who presented here with complaints of right shoulder pain after falling at home.  She was also found to be in rapid A. fib in presentation.  BP was soft and she was hypotensive.  X-ray showed severely displaced fracture of the right humerus with old acromial fracture.  She underwent reverse shoulder arthroplasty on 12/12.  Hospital was remarkable for A. fib.  Cardiology consulted and following.  Still on A. fib on RVR as per this mrng.  PT/OT recommended skilled nursing facility on discharge.  Assessment & Plan:   Principal Problem:   Right humeral fracture Active Problems:   DM type 2, controlled, with complication (HCC)   Persistent atrial fibrillation with rapid ventricular response (HCC)   Osteoporosis   Chronic anticoagulation   Hyperlipidemia   Chronic insomnia   Chronic congestive heart failure with left ventricular diastolic dysfunction (HCC)   Acute on chronic diastolic heart failure ([place])   Fall at home, initial encounter   Atrial fibrillation with RVR ([place])   S/P shoulder replacement, right    Chronic A. Fib/rapid A. fib: Still on rapid A. fib this morning.  Cardiology following.  She is on digoxin and  metoprolol.  Dose of metoprolol increased.  Started on Cardizem drip,being continued.  On Xarelto for anticoagulation.  Traumatic acute displaced, closed right humerus fracture: Status post reverse shoulder arthroplasty on 12/12 by Dr. Alma Friendly.  Continue pain management, supportive care.  Follow-up with orthopedics as an outpatient.  No weightbearing  Acute on chronic diastolic congestive heart failure: Volume overloaded after surgery.  Started on Lasix IV 40 mg ,now on hold.  Cardiology  following. Losartan on hold due to soft blood pressure. Continue to monitor daily output, input, weight  Acute hypoxic respiratory failure: She was on 2 L of oxygen ,now weaned to room air.  Thought to be secondary to fluid overload/atelectasis.  On iv diuresis.  .  Not on oxygen at home. CXR on 12/14 showed small right pleural effusion and right greater than left basilar airspace disease: Likely atelectasis, no evidence of pneumonia clinically  Type 2 diabetes: A1c of 8.6 indicating poor control.  On metformin at home.  Might need to add another medication on DC.Continue current insulin regimen.  Monitor blood sugars  Hypokalemia: Being supplemented and monitored  History of aortic dissection: Chronic.  Stable  Insomnia: Trazodone  Fall/deconditioning/debility/advanced age: PT/OT  recommended skilled nursing facility on discharge.         DVT prophylaxis:Xarelto Code Status:Full  Family Communication: None at bedside Patient status:Inpatient  Dispo: The patient is from: Independent living facilty              Anticipated d/c is to:  SNF              Anticipated d/c date is: In 1-2 days.Heart rate needs better control before dc  Consultants: Cardiology, orthopedics  Procedures:Arthoplasty  Antimicrobials:  Anti-infectives (From admission, onward)    Start     Dose/Rate Route Frequency Ordered Stop   05/04/21 2300  ceFAZolin (ANCEF) IVPB 2g/100 mL premix        2 g 200 mL/hr over 30 Minutes Intravenous Every 6 hours 05/04/21 2052 05/05/21 1324   05/04/21 0630  ceFAZolin (ANCEF) IVPB 2g/100 mL premix  2 g 200 mL/hr over 30 Minutes Intravenous On call to O.R. 05/03/21 1958 05/04/21 1650       Subjective:  Patient seen and examined at the bedside this morning.  Still in A. fib with RVR.  Denies any complaints today, no shortness of breath or chest pain Objective: Vitals:   05/08/21 2013 05/08/21 2154 05/09/21 0055 05/09/21 0443  BP: 115/76 111/83 (!) 133/96  (!) 142/75  Pulse: (!) 157 (!) 123 (!) 124 (!) 120  Resp: 20  18 17   Temp: 98.6 F (37 C)   98.5 F (36.9 C)  TempSrc: Oral   Oral  SpO2: 94%  93% 99%  Weight:    65.2 kg  Height:        Intake/Output Summary (Last 24 hours) at 05/09/2021 0729 Last data filed at 05/09/2021 0444 Gross per 24 hour  Intake --  Output 1370 ml  Net -1370 ml   Filed Weights   05/07/21 0424 05/08/21 0500 05/09/21 0443  Weight: 68.7 kg 65.3 kg 65.2 kg    Examination:  General exam: Pleasant, extremely elderly, deconditioned HEENT: PERRL Respiratory system:  no wheezes or crackles  Cardiovascular system: A. fib with RVR Gastrointestinal system: Abdomen is nondistended, soft and nontender. Central nervous system: Alert and oriented Extremities: No edema, no clubbing ,no cyanosis, sling on the right upper extremity Skin: No rashes, no ulcers,no icterus    Data Reviewed: I have personally reviewed following labs and imaging studies  CBC: Recent Labs  Lab 05/03/21 0340 05/04/21 0105 05/05/21 0413 05/06/21 0310 05/07/21 0126  WBC 6.4 6.6 9.7 9.9 7.5  HGB 11.6* 11.2* 11.4* 10.5* 11.1*  HCT 34.7* 33.3* 34.8* 31.7* 33.6*  MCV 96.7 96.8 97.2 96.6 98.2  PLT 157 153 170 185 175   Basic Metabolic Panel: Recent Labs  Lab 05/04/21 0105 05/05/21 0413 05/06/21 0310 05/07/21 0126 05/08/21 0747  NA 133* 134* 132* 135 133*  K 4.1 5.0 4.3 4.5 4.2  CL 100 101 97* 101 97*  CO2 23 22 26 27 27   GLUCOSE 154* 313* 238* 202* 251*  BUN 17 11 18 23 19   CREATININE 0.70 0.83 0.76 0.83 0.74  CALCIUM 8.2* 8.4* 8.6* 8.4* 8.7*  PHOS  --   --   --   --  2.7   GFR: Estimated Creatinine Clearance: 37.3 mL/min (by C-G formula based on SCr of 0.74 mg/dL). Liver Function Tests: Recent Labs  Lab 05/08/21 0747  ALBUMIN 2.9*   No results for input(s): LIPASE, AMYLASE in the last 168 hours. No results for input(s): AMMONIA in the last 168 hours. Coagulation Profile: No results for input(s): INR, PROTIME in  the last 168 hours. Cardiac Enzymes: No results for input(s): CKTOTAL, CKMB, CKMBINDEX, TROPONINI in the last 168 hours. BNP (last 3 results) No results for input(s): PROBNP in the last 8760 hours. HbA1C: No results for input(s): HGBA1C in the last 72 hours. CBG: Recent Labs  Lab 05/07/21 2249 05/08/21 0745 05/08/21 1141 05/08/21 1705 05/08/21 2105  GLUCAP 279* 237* 232* 218* 275*   Lipid Profile: No results for input(s): CHOL, HDL, LDLCALC, TRIG, CHOLHDL, LDLDIRECT in the last 72 hours. Thyroid Function Tests: No results for input(s): TSH, T4TOTAL, FREET4, T3FREE, THYROIDAB in the last 72 hours.  Anemia Panel: No results for input(s): VITAMINB12, FOLATE, FERRITIN, TIBC, IRON, RETICCTPCT in the last 72 hours. Sepsis Labs: No results for input(s): PROCALCITON, LATICACIDVEN in the last 168 hours.  Recent Results (from the past 240 hour(s))  Resp Panel by  RT-PCR (Flu A&B, Covid) Nasopharyngeal Swab     Status: None   Collection Time: 05/02/21  8:49 AM   Specimen: Nasopharyngeal Swab; Nasopharyngeal(NP) swabs in vial transport medium  Result Value Ref Range Status   SARS Coronavirus 2 by RT PCR NEGATIVE NEGATIVE Final    Comment: (NOTE) SARS-CoV-2 target nucleic acids are NOT DETECTED.  The SARS-CoV-2 RNA is generally detectable in upper respiratory specimens during the acute phase of infection. The lowest concentration of SARS-CoV-2 viral copies this assay can detect is 138 copies/mL. A negative result does not preclude SARS-Cov-2 infection and should not be used as the sole basis for treatment or other patient management decisions. A negative result may occur with  improper specimen collection/handling, submission of specimen other than nasopharyngeal swab, presence of viral mutation(s) within the areas targeted by this assay, and inadequate number of viral copies(<138 copies/mL). A negative result must be combined with clinical observations, patient history, and  epidemiological information. The expected result is Negative.  Fact Sheet for Patients:  EntrepreneurPulse.com.au  Fact Sheet for Healthcare Providers:  IncredibleEmployment.be  This test is no t yet approved or cleared by the [place] FDA and  has been authorized for detection and/or diagnosis of SARS-CoV-2 by FDA under an Emergency Use Authorization (EUA). This EUA will remain  in effect (meaning this test can be used) for the duration of the COVID-19 declaration under Section 564(b)(1) of the Act, 21 U.S.C.section 360bbb-3(b)(1), unless the authorization is terminated  or revoked sooner.       Influenza A by PCR NEGATIVE NEGATIVE Final   Influenza B by PCR NEGATIVE NEGATIVE Final    Comment: (NOTE) The Xpert Xpress SARS-CoV-2/FLU/RSV plus assay is intended as an aid in the diagnosis of influenza from Nasopharyngeal swab specimens and should not be used as a sole basis for treatment. Nasal washings and aspirates are unacceptable for Xpert Xpress SARS-CoV-2/FLU/RSV testing.  Fact Sheet for Patients: EntrepreneurPulse.com.au  Fact Sheet for Healthcare Providers: IncredibleEmployment.be  This test is not yet approved or cleared by the [place] FDA and has been authorized for detection and/or diagnosis of SARS-CoV-2 by FDA under an Emergency Use Authorization (EUA). This EUA will remain in effect (meaning this test can be used) for the duration of the COVID-19 declaration under Section 564(b)(1) of the Act, 21 U.S.C. section 360bbb-3(b)(1), unless the authorization is terminated or revoked.  Performed at [facility], [place] [address], [place], [place] [address]   Surgical PCR screen     Status: None   Collection Time: 05/04/21  8:28 AM   Specimen: Nasal Mucosa; Nasal Swab  Result Value Ref Range Status   MRSA, PCR NEGATIVE NEGATIVE Final   Staphylococcus aureus NEGATIVE NEGATIVE Final     Comment: (NOTE) The Xpert SA Assay (FDA approved for NASAL specimens in patients 81 years of age and older), is one component of a comprehensive surveillance program. It is not intended to diagnose infection nor to guide or monitor treatment. Performed at [facility], [place] [address], [place], [place] [address]          Radiology Studies: No results found.      Scheduled Meds:  digoxin  0.0625 mg Oral Daily   docusate sodium  100 mg Oral BID   furosemide  40 mg Oral Daily   insulin aspart  0-6 Units Subcutaneous TID AC & HS   insulin detemir  5 Units Subcutaneous Daily   metoprolol succinate  50 mg Oral BID  polyethylene glycol  17 g Oral Daily   potassium chloride  10 mEq Oral Daily   rivaroxaban  15 mg Oral Q supper   senna-docusate  1 tablet Oral BID   sodium chloride flush  3 mL Intravenous Q12H   Continuous Infusions:  sodium chloride 10 mL/hr at 05/05/21 0321   diltiazem (CARDIZEM) infusion 12.5 mg/hr (05/09/21 0027)     LOS: 7 days    Time spent:25 mins. More than 50% of that time was spent in counseling and/or coordination of care.      Shelly Coss, MD [facility] P12/17/2022, 7:29 AM

## 2021-05-09 NOTE — Progress Notes (Signed)
Pt noted to have bruising to right upper arm and lower arm when sling removed for assessment, with firmness noted to upper arm.  Patient states this is new as of earlier today.  Elevated right arm and applied ice packs x 2, hgb stable, notified MD per patient request.

## 2021-05-09 NOTE — Progress Notes (Addendum)
Progress Note  Patient Name: Tara Bradley Date of Encounter: 05/09/2021  [facility] Cardiologist: [name], MD    Subjective   87 year old female with a history of thrombosed descending aortic dissection, aortic valve sclerosis and chronic atrial fibrillation.  She was admitted to the hospital after falling at home and sustaining a right shoulder injury.  She had rapid atrial fibrillation with that injury.  She had reverse shoulder arthroplasty on December 12.  We were consulted for further evaluation and management of her rapid atrial fibrillation.  She also has chronic diastolic congestive heart failure.  I/O are 1.3 liters for this hospitalization  She has been getting IV Lasix.  She was on oral Lasix at home. She is on Cardizem drip 15 mg/h although the drip has currently run out.  She is also on metoprolol 50 mg twice a day.  She will likely need a little bit more metoprolol to help slow her heart rate.   Inpatient Medications    Scheduled Meds:  digoxin  0.0625 mg Oral Daily   docusate sodium  100 mg Oral BID   furosemide  40 mg Oral Daily   insulin aspart  0-6 Units Subcutaneous TID AC & HS   insulin detemir  5 Units Subcutaneous Daily   metoprolol succinate  50 mg Oral BID   polyethylene glycol  17 g Oral Daily   potassium chloride  10 mEq Oral Daily   rivaroxaban  15 mg Oral Q supper   senna-docusate  1 tablet Oral BID   sodium chloride flush  3 mL Intravenous Q12H   Continuous Infusions:  sodium chloride 10 mL/hr at 05/05/21 0321   diltiazem (CARDIZEM) infusion 15 mg/hr (05/09/21 0814)   PRN Meds: sodium chloride, acetaminophen **OR** acetaminophen, albuterol, bisacodyl, HYDROcodone-acetaminophen, menthol-cetylpyridinium **OR** phenol, metoCLOPramide **OR** metoCLOPramide (REGLAN) injection, morphine injection, ondansetron **OR** ondansetron (ZOFRAN) IV, polyethylene glycol, traZODone   Vital Signs    Vitals:   05/09/21 0055 05/09/21 0443 05/09/21  0748 05/09/21 0815  BP: (!) 133/96 (!) 142/75 130/86   Pulse: (!) 124 (!) 120 (!) 137 (!) 121  Resp: 18 17 18    Temp:  98.5 F (36.9 C) 98.5 F (36.9 C)   TempSrc:  Oral Oral   SpO2: 93% 99% 94%   Weight:  65.2 kg    Height:        Intake/Output Summary (Last 24 hours) at 05/09/2021 0854 Last data filed at 05/09/2021 0444 Gross per 24 hour  Intake --  Output 1370 ml  Net -1370 ml   Last 3 Weights 05/09/2021 05/08/2021 05/07/2021  Weight (lbs) 143 lb 11.8 oz 143 lb 15.4 oz 151 lb 6.4 oz  Weight (kg) 65.2 kg 65.3 kg 68.675 kg      Telemetry    Atrial fib with HR 120-130s - Personally Reviewed  ECG     - Personally Reviewed  Physical Exam   GEN: Elderly female, right arm is in a sling. Neck: No JVD Cardiac: Irregularly irregular, tachycardic Respiratory: Clear to auscultation bilaterally. GI: Soft, nontender, non-distended  MS: No edema; No deformity. Neuro:  Nonfocal  Psych: Normal affect   Labs    High Sensitivity Troponin:  No results for input(s): TROPONINIHS in the last 720 hours.   Chemistry Recent Labs  Lab 05/06/21 0310 05/07/21 0126 05/08/21 0747  NA 132* 135 133*  K 4.3 4.5 4.2  CL 97* 101 97*  CO2 26 27 27   GLUCOSE 238* 202* 251*  BUN 18 23 19  CREATININE 0.76 0.83 0.74  CALCIUM 8.6* 8.4* 8.7*  ALBUMIN  --   --  2.9*  GFRNONAA >60 >60 >60  ANIONGAP 9 7 9     Lipids No results for input(s): CHOL, TRIG, HDL, LABVLDL, LDLCALC, CHOLHDL in the last 168 hours.  Hematology Recent Labs  Lab 05/05/21 0413 05/06/21 0310 05/07/21 0126  WBC 9.7 9.9 7.5  RBC 3.58* 3.28* 3.42*  HGB 11.4* 10.5* 11.1*  HCT 34.8* 31.7* 33.6*  MCV 97.2 96.6 98.2  MCH 31.8 32.0 32.5  MCHC 32.8 33.1 33.0  RDW 13.7 14.1 14.6  PLT 170 185 180   Thyroid  Recent Labs  Lab 05/04/21 0105  TSH 2.569    BNP Recent Labs  Lab 05/07/21 0126  BNP 124.6*    DDimer No results for input(s): DDIMER in the last 168 hours.   Radiology    No results found.  Cardiac  Studies     Patient Profile     87 y.o. female   [place]    1.  Rapid atrial fibrillation: Patient has chronic atrial relation and now has rapid ventricular response.  This is quite likely due to her postsurgical state, shoulder pain, low albumin  I suspect that her heart rate will improve as she gets a little bit further out from surgery.  For now we will continue Cardizem drip.  We will increase her metoprolol to 75 mg twice a day. Increase dogoxin to 0.125 mg a day . Follow closely    Will continue to follow     For questions or updates, please contact [place] Please consult [email or web address] for contact info under        Signed, Mertie Moores, MD  05/09/2021, 8:54 AM

## 2021-05-10 LAB — BASIC METABOLIC PANEL
Anion gap: 9 (ref 5–15)
BUN: 17 mg/dL (ref 8–23)
CO2: 28 mmol/L (ref 22–32)
Calcium: 9.1 mg/dL (ref 8.9–10.3)
Chloride: 99 mmol/L (ref 98–111)
Creatinine, Ser: 0.67 mg/dL (ref 0.44–1.00)
GFR, Estimated: >60 mL/min (ref >60)
Glucose, Bld: 67 mg/dL — ABNORMAL LOW (ref 70–99)
Potassium: 3.9 mmol/L (ref 3.5–5.1)
Sodium: 136 mmol/L (ref 135–145)

## 2021-05-10 LAB — GLUCOSE, CAPILLARY
Glucose-Capillary: 107 mg/dL — ABNORMAL HIGH (ref 70–99)
Glucose-Capillary: 269 mg/dL — ABNORMAL HIGH (ref 70–99)
Glucose-Capillary: 168 mg/dL — ABNORMAL HIGH (ref 70–99)
Glucose-Capillary: 281 mg/dL — ABNORMAL HIGH (ref 70–99)

## 2021-05-10 MED ORDER — INSULIN DETEMIR 100 UNIT/ML ~~LOC~~ SOLN
10.0000 [IU] | Freq: Every day | SUBCUTANEOUS | Status: DC
  Administered 2021-05-10 – 2021-05-11 (×2): 10 [IU] via SUBCUTANEOUS
  Filled 2021-05-10 (×2): qty 0.1

## 2021-05-10 NOTE — Plan of Care (Signed)
°  Problem: Education: Goal: Knowledge of General Education information will improve Description: Including pain rating scale, medication(s)/side effects and non-pharmacologic comfort measures Outcome: Progressing   Problem: Clinical Measurements: Goal: Ability to maintain clinical measurements within normal limits will improve Outcome: Progressing   Problem: Clinical Measurements: Goal: Respiratory complications will improve Outcome: Progressing   Problem: Clinical Measurements: Goal: Cardiovascular complication will be avoided Outcome: Progressing   Problem: Activity: Goal: Risk for activity intolerance will decrease Outcome: Progressing   Problem: Pain Managment: Goal: General experience of comfort will improve Outcome: Progressing   Problem: Safety: Goal: Ability to remain free from injury will improve Outcome: Progressing   Problem: Skin Integrity: Goal: Risk for impaired skin integrity will decrease Outcome: Progressing

## 2021-05-10 NOTE — Progress Notes (Signed)
Notified on-call ortho doctor via telephone of total arm bruising and some increased swelling. They are not concerned for compartment syndrome and instructed to continue ice on and off the arm and that the doctor will evaluate tomorrow morning.   Radial and brachial pulses strong, arm is warm and has good capillary refill. Will continue to monitor.

## 2021-05-10 NOTE — Progress Notes (Signed)
Mobility Specialist Progress Note:   05/10/21 1221  Mobility  Activity Ambulated in hall  Level of Assistance Modified independent, requires aide device or extra time  Assistive Device Front wheel walker  Distance Ambulated (ft) 480 ft  Mobility Ambulated with assistance in hallway  Mobility Response Tolerated well  Mobility performed by Mobility specialist  Bed Position Chair  $Mobility charge 1 Mobility   Pt received in chair willing to participate in mobility. No complaints of pain. Pt returned to chair with call bell in reach and all needs met.   [facility] Public librarian Phone [phone] Secondary Phone [phone]

## 2021-05-10 NOTE — Progress Notes (Signed)
PROGRESS NOTE    Tara Bradley  PJA:[id number] DOB: July 04, 1922 DOA: 05/02/2021 PCP: [name], MD   Chief Complain: Right shoulder pain  Brief Narrative: Patient is a 87 year old female with history of chronic A. fib on Xarelto, hypertension, hyperlipidemia, insomnia who presented here with complaints of right shoulder pain after falling at home.  She was also found to be in rapid A. fib in presentation.  BP was soft and she was hypotensive.  X-ray showed severely displaced fracture of the right humerus with old acromial fracture.  She underwent reverse shoulder arthroplasty on 12/12.  Hospital was remarkable for A. fib.  Cardiology consulted and following.  Heart rate is still more than 100 this morning PT/OT recommended skilled nursing facility on discharge.  Assessment & Plan:   Principal Problem:   Right humeral fracture Active Problems:   DM type 2, controlled, with complication (HCC)   Persistent atrial fibrillation with rapid ventricular response (HCC)   Osteoporosis   Chronic anticoagulation   Hyperlipidemia   Chronic insomnia   Chronic congestive heart failure with left ventricular diastolic dysfunction (HCC)   Acute on chronic diastolic heart failure ([place])   Fall at home, initial encounter   Atrial fibrillation with RVR ([place])   S/P shoulder replacement, right    Chronic A. Fib/rapid A. fib: Heart rate fluctuating in the range of 100-120 this morning. cardiology following.  She is on digoxin and  metoprolol. Started on Cardizem drip,being continued.  On Xarelto for anticoagulation.  Traumatic acute displaced, closed right humerus fracture: Status post reverse shoulder arthroplasty on 12/12 by Dr. Alma Friendly.  Continue pain management, supportive care.  Follow-up with orthopedics as an outpatient.  No weightbearing  Acute on chronic diastolic congestive heart failure: Volume overloaded after surgery.  Started on Lasix IV 40 mg ,now on hold.  Continue Lasix 20 mg daily.  cardiology following. Losartan on hold due to soft blood pressure. Continue to monitor daily output, input, weight  Acute hypoxic respiratory failure: She was on 2 L of oxygen ,now weaned to room air.  Thought to be secondary to fluid overload/atelectasis. She was given IV Lasix.  .  Not on oxygen at home. CXR on 12/14 showed small right pleural effusion and right greater than left basilar airspace disease: Likely atelectasis, no evidence of pneumonia clinically  Type 2 diabetes: A1c of 8.6 indicating poor control.  On metformin at home.  Might need to add another medication on DC.Continue current insulin regimen.  Monitor blood sugars  Hypokalemia: Being supplemented and monitored  History of aortic dissection: Chronic.  Stable  Insomnia: Trazodone  Fall/deconditioning/debility/advanced age: PT/OT  recommended skilled nursing facility on discharge.         DVT prophylaxis:Xarelto Code Status:Full  Family Communication: None at bedside Patient status:Inpatient  Dispo: The patient is from: Independent living facilty              Anticipated d/c is to:  SNF              Anticipated d/c date is: In 1-2 days.Heart rate needs better control before dc  Consultants: Cardiology, orthopedics  Procedures:Arthoplasty  Antimicrobials:  Anti-infectives (From admission, onward)    Start     Dose/Rate Route Frequency Ordered Stop   05/04/21 2300  ceFAZolin (ANCEF) IVPB 2g/100 mL premix        2 g 200 mL/hr over 30 Minutes Intravenous Every 6 hours 05/04/21 2052 05/05/21 1324   05/04/21 0630  ceFAZolin (ANCEF) IVPB 2g/100 mL premix  2 g 200 mL/hr over 30 Minutes Intravenous On call to O.R. 05/03/21 1958 05/04/21 1650       Subjective:  Patient seen and examined at bedside this morning.  Hemodynamically stable but her blood heart rate was ranging in the range of 100 -120.  Slightly better than yesterday.  She denies new complaints.   Objective: Vitals:   05/09/21 2000  05/09/21 2112 05/09/21 2357 05/10/21 0434  BP: 127/82  140/89 (!) 113/91  Pulse: (!) 102 (!) 128  99  Resp: 16   16  Temp: 98.8 F (37.1 C)  97.7 F (36.5 C) 98.1 F (36.7 C)  TempSrc: Oral  Oral Oral  SpO2: 95%   99%  Weight:    65.9 kg  Height:        Intake/Output Summary (Last 24 hours) at 05/10/2021 0639 Last data filed at 05/10/2021 0348 Gross per 24 hour  Intake 926.43 ml  Output 1700 ml  Net -773.57 ml   Filed Weights   05/08/21 0500 05/09/21 0443 05/10/21 0434  Weight: 65.3 kg 65.2 kg 65.9 kg    Examination:   General exam: Pleasant elderly female, deconditioned, chronically ill looking HEENT: PERRL Respiratory system:  no wheezes or crackles  Cardiovascular system: S1 & S2 heard, RRR.  Gastrointestinal system: Abdomen is nondistended, soft and nontender. Central nervous system: Alert and oriented Extremities: No edema, no clubbing ,no cyanosis, sling on the right arm Skin: Extensive ecchymosis on the back of right arm, no ulcers,no icterus    Data Reviewed: I have personally reviewed following labs and imaging studies  CBC: Recent Labs  Lab 05/04/21 0105 05/05/21 0413 05/06/21 0310 05/07/21 0126 05/09/21 2111  WBC 6.6 9.7 9.9 7.5  --   HGB 11.2* 11.4* 10.5* 11.1* 10.7*  HCT 33.3* 34.8* 31.7* 33.6* 31.2*  MCV 96.8 97.2 96.6 98.2  --   PLT 153 170 185 180  --    Basic Metabolic Panel: Recent Labs  Lab 05/05/21 0413 05/06/21 0310 05/07/21 0126 05/08/21 0747 05/10/21 0204  NA 134* 132* 135 133* 136  K 5.0 4.3 4.5 4.2 3.9  CL 101 97* 101 97* 99  CO2 22 26 27 27 28   GLUCOSE 313* 238* 202* 251* 67*  BUN 11 18 23 19 17   CREATININE 0.83 0.76 0.83 0.74 0.67  CALCIUM 8.4* 8.6* 8.4* 8.7* 9.1  PHOS  --   --   --  2.7  --    GFR: Estimated Creatinine Clearance: 37.5 mL/min (by C-G formula based on SCr of 0.67 mg/dL). Liver Function Tests: Recent Labs  Lab 05/08/21 0747  ALBUMIN 2.9*   No results for input(s): LIPASE, AMYLASE in the last 168  hours. No results for input(s): AMMONIA in the last 168 hours. Coagulation Profile: No results for input(s): INR, PROTIME in the last 168 hours. Cardiac Enzymes: No results for input(s): CKTOTAL, CKMB, CKMBINDEX, TROPONINI in the last 168 hours. BNP (last 3 results) No results for input(s): PROBNP in the last 8760 hours. HbA1C: No results for input(s): HGBA1C in the last 72 hours. CBG: Recent Labs  Lab 05/08/21 2105 05/09/21 0745 05/09/21 1108 05/09/21 1613 05/09/21 2122  GLUCAP 275* 214* 345* 258* 233*   Lipid Profile: No results for input(s): CHOL, HDL, LDLCALC, TRIG, CHOLHDL, LDLDIRECT in the last 72 hours. Thyroid Function Tests: No results for input(s): TSH, T4TOTAL, FREET4, T3FREE, THYROIDAB in the last 72 hours.  Anemia Panel: No results for input(s): VITAMINB12, FOLATE, FERRITIN, TIBC, IRON, RETICCTPCT in the last 72  hours. Sepsis Labs: No results for input(s): PROCALCITON, LATICACIDVEN in the last 168 hours.  Recent Results (from the past 240 hour(s))  Resp Panel by RT-PCR (Flu A&B, Covid) Nasopharyngeal Swab     Status: None   Collection Time: 05/02/21  8:49 AM   Specimen: Nasopharyngeal Swab; Nasopharyngeal(NP) swabs in vial transport medium  Result Value Ref Range Status   SARS Coronavirus 2 by RT PCR NEGATIVE NEGATIVE Final    Comment: (NOTE) SARS-CoV-2 target nucleic acids are NOT DETECTED.  The SARS-CoV-2 RNA is generally detectable in upper respiratory specimens during the acute phase of infection. The lowest concentration of SARS-CoV-2 viral copies this assay can detect is 138 copies/mL. A negative result does not preclude SARS-Cov-2 infection and should not be used as the sole basis for treatment or other patient management decisions. A negative result may occur with  improper specimen collection/handling, submission of specimen other than nasopharyngeal swab, presence of viral mutation(s) within the areas targeted by this assay, and inadequate number  of viral copies(<138 copies/mL). A negative result must be combined with clinical observations, patient history, and epidemiological information. The expected result is Negative.  Fact Sheet for Patients:  EntrepreneurPulse.com.au  Fact Sheet for Healthcare Providers:  IncredibleEmployment.be  This test is no t yet approved or cleared by the [place] FDA and  has been authorized for detection and/or diagnosis of SARS-CoV-2 by FDA under an Emergency Use Authorization (EUA). This EUA will remain  in effect (meaning this test can be used) for the duration of the COVID-19 declaration under Section 564(b)(1) of the Act, 21 U.S.C.section 360bbb-3(b)(1), unless the authorization is terminated  or revoked sooner.       Influenza A by PCR NEGATIVE NEGATIVE Final   Influenza B by PCR NEGATIVE NEGATIVE Final    Comment: (NOTE) The Xpert Xpress SARS-CoV-2/FLU/RSV plus assay is intended as an aid in the diagnosis of influenza from Nasopharyngeal swab specimens and should not be used as a sole basis for treatment. Nasal washings and aspirates are unacceptable for Xpert Xpress SARS-CoV-2/FLU/RSV testing.  Fact Sheet for Patients: EntrepreneurPulse.com.au  Fact Sheet for Healthcare Providers: IncredibleEmployment.be  This test is not yet approved or cleared by the [place] FDA and has been authorized for detection and/or diagnosis of SARS-CoV-2 by FDA under an Emergency Use Authorization (EUA). This EUA will remain in effect (meaning this test can be used) for the duration of the COVID-19 declaration under Section 564(b)(1) of the Act, 21 U.S.C. section 360bbb-3(b)(1), unless the authorization is terminated or revoked.  Performed at [facility], [place] [address] [place], [place] [address]   Surgical PCR screen     Status: None   Collection Time: 05/04/21  8:28 AM   Specimen: Nasal Mucosa; Nasal Swab   Result Value Ref Range Status   MRSA, PCR NEGATIVE NEGATIVE Final   Staphylococcus aureus NEGATIVE NEGATIVE Final    Comment: (NOTE) The Xpert SA Assay (FDA approved for NASAL specimens in patients 4 years of age and older), is one component of a comprehensive surveillance program. It is not intended to diagnose infection nor to guide or monitor treatment. Performed at [facility], [place] [address] [place], [place] [address]          Radiology Studies: No results found.      Scheduled Meds:  digoxin  0.125 mg Oral Daily   docusate sodium  100 mg Oral BID   furosemide  20 mg Oral Daily   insulin aspart  0-15 Units Subcutaneous TID WC   insulin detemir  15 Units Subcutaneous Daily   metoprolol succinate  75 mg Oral BID   polyethylene glycol  17 g Oral Daily   potassium chloride  10 mEq Oral Daily   rivaroxaban  15 mg Oral Q supper   senna-docusate  1 tablet Oral BID   sodium chloride flush  3 mL Intravenous Q12H   Continuous Infusions:  sodium chloride 10 mL/hr at 05/05/21 0321   diltiazem (CARDIZEM) infusion 15 mg/hr (05/10/21 0057)     LOS: 8 days    Time spent:25 mins. More than 50% of that time was spent in counseling and/or coordination of care.      Shelly Coss, MD [facility] P12/18/2022, 6:39 AM

## 2021-05-10 NOTE — Progress Notes (Signed)
Progress Note  Patient Name: Tara Bradley Date of Encounter: 05/10/2021  [facility] Cardiologist: [name], MD    Subjective   87 year old female with a history of thrombosed descending aortic dissection, aortic valve sclerosis and chronic atrial fibrillation.  She was admitted to the hospital after falling at home and sustaining a right shoulder injury.  She had rapid atrial fibrillation with that injury.  She had reverse shoulder arthroplasty on December 12.  We were consulted for further evaluation and management of her rapid atrial fibrillation.  She also has chronic diastolic congestive heart failure.  We increased metoprolol yesterday ,   increased digoxin slightly  HR is improved.  No chest pain, no shortness of breath  Inpatient Medications    Scheduled Meds:  digoxin  0.125 mg Oral Daily   docusate sodium  100 mg Oral BID   furosemide  20 mg Oral Daily   insulin aspart  0-15 Units Subcutaneous TID WC   insulin detemir  10 Units Subcutaneous Daily   metoprolol succinate  75 mg Oral BID   polyethylene glycol  17 g Oral Daily   potassium chloride  10 mEq Oral Daily   rivaroxaban  15 mg Oral Q supper   senna-docusate  1 tablet Oral BID   sodium chloride flush  3 mL Intravenous Q12H   Continuous Infusions:  sodium chloride 10 mL/hr at 05/05/21 0321   diltiazem (CARDIZEM) infusion 15 mg/hr (05/10/21 0806)   PRN Meds: sodium chloride, acetaminophen **OR** acetaminophen, albuterol, bisacodyl, HYDROcodone-acetaminophen, menthol-cetylpyridinium **OR** phenol, metoCLOPramide **OR** metoCLOPramide (REGLAN) injection, morphine injection, ondansetron **OR** ondansetron (ZOFRAN) IV, polyethylene glycol, traZODone   Vital Signs    Vitals:   05/09/21 2357 05/10/21 0434 05/10/21 0723 05/10/21 0807  BP: 140/89 (!) 113/91 120/80   Pulse:  99 93 (!) 116  Resp:  16 18   Temp: 97.7 F (36.5 C) 98.1 F (36.7 C) 98.7 F (37.1 C)   TempSrc: Oral Oral Oral   SpO2:   99% 98%   Weight:  65.9 kg    Height:        Intake/Output Summary (Last 24 hours) at 05/10/2021 0850 Last data filed at 05/10/2021 0348 Gross per 24 hour  Intake 782.27 ml  Output 1700 ml  Net -917.73 ml    Last 3 Weights 05/10/2021 05/09/2021 05/08/2021  Weight (lbs) 145 lb 4.5 oz 143 lb 11.8 oz 143 lb 15.4 oz  Weight (kg) 65.9 kg 65.2 kg 65.3 kg      Telemetry    Atrial fib with HR 120-130s - Personally Reviewed  ECG     - Personally Reviewed  Physical Exam   GEN: Elderly female, her right arm remains in a sling.  She is no acute distress Neck: No JVD Cardiac: Irregularly irregular.  She is somewhat tachycardic although improved from yesterday. Respiratory: Clear to auscultation. GI: Good bowel sounds, soft, nontender no distention. MS: Right arm is in a sling.  She has no leg edema Neuro:  Nonfocal  Psych: Normal affect   Labs    High Sensitivity Troponin:  No results for input(s): TROPONINIHS in the last 720 hours.   Chemistry Recent Labs  Lab 05/07/21 0126 05/08/21 0747 05/10/21 0204  NA 135 133* 136  K 4.5 4.2 3.9  CL 101 97* 99  CO2 27 27 28   GLUCOSE 202* 251* 67*  BUN 23 19 17   CREATININE 0.83 0.74 0.67  CALCIUM 8.4* 8.7* 9.1  ALBUMIN  --  2.9*  --  GFRNONAA >60 >60 >60  ANIONGAP 7 9 9      Lipids No results for input(s): CHOL, TRIG, HDL, LABVLDL, LDLCALC, CHOLHDL in the last 168 hours.  Hematology Recent Labs  Lab 05/05/21 0413 05/06/21 0310 05/07/21 0126 05/09/21 2111  WBC 9.7 9.9 7.5  --   RBC 3.58* 3.28* 3.42*  --   HGB 11.4* 10.5* 11.1* 10.7*  HCT 34.8* 31.7* 33.6* 31.2*  MCV 97.2 96.6 98.2  --   MCH 31.8 32.0 32.5  --   MCHC 32.8 33.1 33.0  --   RDW 13.7 14.1 14.6  --   PLT 170 185 180  --     Thyroid  Recent Labs  Lab 05/04/21 0105  TSH 2.569     BNP Recent Labs  Lab 05/07/21 0126  BNP 124.6*     DDimer No results for input(s): DDIMER in the last 168 hours.   Radiology    No results found.  Cardiac  Studies     Patient Profile     87 y.o. female   [place]    1.  Rapid atrial fibrillation: Patient continues to have rapid atrial fibrillation.  Her heart rates a little bit better on the increased dose of metoprolol and the addition of digoxin.  We will continue to follow closely.  I would anticipate that her heart rate would continue to slow.  My goal is to get her heart rate below 100 and at that point we can either discharge her to home or skilled nursing facility, which ever is deemed appropriate by the medical team, OT, PT.   Will continue to follow     For questions or updates, please contact [place] Please consult [email or web address] for contact info under        Signed, Mertie Moores, MD  05/10/2021, 8:51 AM

## 2021-05-11 DIAGNOSIS — I5040 Unspecified combined systolic (congestive) and diastolic (congestive) heart failure: Secondary | ICD-10-CM | POA: Diagnosis not present

## 2021-05-11 DIAGNOSIS — E119 Type 2 diabetes mellitus without complications: Secondary | ICD-10-CM | POA: Diagnosis not present

## 2021-05-11 DIAGNOSIS — F5101 Primary insomnia: Secondary | ICD-10-CM | POA: Diagnosis not present

## 2021-05-11 DIAGNOSIS — R5381 Other malaise: Secondary | ICD-10-CM | POA: Diagnosis not present

## 2021-05-11 DIAGNOSIS — I482 Chronic atrial fibrillation, unspecified: Secondary | ICD-10-CM | POA: Diagnosis not present

## 2021-05-11 DIAGNOSIS — I10 Essential (primary) hypertension: Secondary | ICD-10-CM | POA: Diagnosis not present

## 2021-05-11 DIAGNOSIS — R2689 Other abnormalities of gait and mobility: Secondary | ICD-10-CM | POA: Diagnosis not present

## 2021-05-11 DIAGNOSIS — Z7401 Bed confinement status: Secondary | ICD-10-CM | POA: Diagnosis not present

## 2021-05-11 DIAGNOSIS — M810 Age-related osteoporosis without current pathological fracture: Secondary | ICD-10-CM | POA: Diagnosis not present

## 2021-05-11 DIAGNOSIS — R296 Repeated falls: Secondary | ICD-10-CM | POA: Diagnosis not present

## 2021-05-11 DIAGNOSIS — S42301D Unspecified fracture of shaft of humerus, right arm, subsequent encounter for fracture with routine healing: Secondary | ICD-10-CM | POA: Diagnosis not present

## 2021-05-11 DIAGNOSIS — R2681 Unsteadiness on feet: Secondary | ICD-10-CM | POA: Diagnosis not present

## 2021-05-11 DIAGNOSIS — S42291D Other displaced fracture of upper end of right humerus, subsequent encounter for fracture with routine healing: Secondary | ICD-10-CM | POA: Diagnosis not present

## 2021-05-11 DIAGNOSIS — M6281 Muscle weakness (generalized): Secondary | ICD-10-CM | POA: Diagnosis not present

## 2021-05-11 DIAGNOSIS — I4891 Unspecified atrial fibrillation: Secondary | ICD-10-CM | POA: Diagnosis not present

## 2021-05-11 DIAGNOSIS — S42291A Other displaced fracture of upper end of right humerus, initial encounter for closed fracture: Secondary | ICD-10-CM | POA: Diagnosis not present

## 2021-05-11 DIAGNOSIS — S42201A Unspecified fracture of upper end of right humerus, initial encounter for closed fracture: Secondary | ICD-10-CM | POA: Diagnosis not present

## 2021-05-11 DIAGNOSIS — R531 Weakness: Secondary | ICD-10-CM | POA: Diagnosis not present

## 2021-05-11 LAB — RESP PANEL BY RT-PCR (FLU A&B, COVID) ARPGX2
Influenza A by PCR: NEGATIVE
Influenza B by PCR: NEGATIVE
SARS Coronavirus 2 by RT PCR: NEGATIVE

## 2021-05-11 LAB — GLUCOSE, CAPILLARY
Glucose-Capillary: 362 mg/dL — ABNORMAL HIGH (ref 70–99)
Glucose-Capillary: 118 mg/dL — ABNORMAL HIGH (ref 70–99)
Glucose-Capillary: 61 mg/dL — ABNORMAL LOW (ref 70–99)
Glucose-Capillary: 174 mg/dL — ABNORMAL HIGH (ref 70–99)

## 2021-05-11 MED ORDER — DILTIAZEM HCL ER COATED BEADS 180 MG PO CP24
360.0000 mg | ORAL_CAPSULE | Freq: Every day | ORAL | Status: DC
  Administered 2021-05-11: 10:00:00 360 mg via ORAL
  Filled 2021-05-11: qty 2

## 2021-05-11 MED ORDER — DIGOXIN 125 MCG PO TABS: 0.1250 mg | ORAL_TABLET | Freq: Every day | ORAL | Status: DC

## 2021-05-11 MED ORDER — POLYETHYLENE GLYCOL 3350 17 G PO PACK: 17.0000 g | PACK | Freq: Every day | ORAL | 0 refills | Status: AC

## 2021-05-11 MED ORDER — DILTIAZEM HCL ER COATED BEADS 360 MG PO CP24: 360.0000 mg | ORAL_CAPSULE | Freq: Every day | ORAL | Status: DC

## 2021-05-11 MED ORDER — TRAMADOL HCL 50 MG PO TABS: 50.0000 mg | ORAL_TABLET | Freq: Four times a day (QID) | ORAL | 0 refills | Status: DC | PRN

## 2021-05-11 MED ORDER — FUROSEMIDE 20 MG PO TABS: 20.0000 mg | ORAL_TABLET | Freq: Every day | ORAL | Status: DC

## 2021-05-11 MED ORDER — SENNOSIDES-DOCUSATE SODIUM 8.6-50 MG PO TABS: 1.0000 | ORAL_TABLET | Freq: Two times a day (BID) | ORAL | Status: DC

## 2021-05-11 MED ORDER — METOPROLOL SUCCINATE ER 25 MG PO TB24: 75.0000 mg | ORAL_TABLET | Freq: Two times a day (BID) | ORAL | Status: DC

## 2021-05-11 MED ORDER — TRAZODONE HCL 50 MG PO TABS: 50.0000 mg | ORAL_TABLET | Freq: Every evening | ORAL | Status: DC | PRN

## 2021-05-11 NOTE — Progress Notes (Signed)
Occupational Therapy Treatment Patient Details Name: Tara Bradley MRN: [id number] DOB: 1922/12/29 Today's Date: 05/11/2021   History of present illness This 87 y.o. female admitted 05/02/21 after sustaining a fall.  She does endorse hitting her head, and was found to have severely displaced proximal right humeral neck fracture of indeterminate age along with old displaced acromial fracture.   She was also found to have A-Fib with RVR with rates in 150s and hypoxia with ambulation requiring 2L 02. s/p 05/04/21 R total reverse arthroplasty. PMH includes: arthritis, aortic valve stenosis, bradycardia, DM, HTN, A-Fib, syncope, ho ulnar fx   OT comments  Patient continues to make steady progress towards goals in skilled OT session. Patient's session encompassed dressing task in preparation for discharge to SNF later this afternoon. Patient requiring mod A in order to don shirt appropriately, but adhering to precautions throughout. Patient able to complete figure 4 technique to don socks (using LUE only) and min A to don shoes due to no shoe horn present. Discharge remains appropriate; therapy will continue to follow.    Recommendations for follow up therapy are one component of a multi-disciplinary discharge planning process, led by the attending physician.  Recommendations may be updated based on patient status, additional functional criteria and insurance authorization.    Follow Up Recommendations  Skilled nursing-short term rehab (<3 hours/day)    Assistance Recommended at Discharge Frequent or constant Supervision/Assistance  Equipment Recommendations  Other (comment) (TBD at SNF)    Recommendations for Other Services      Precautions / Restrictions Precautions Precautions: Fall Precaution Comments: displaced R humerus fx; monitor HR Required Braces or Orthoses: Sling Restrictions Weight Bearing Restrictions: Yes RUE Weight Bearing: Non weight bearing Other Position/Activity  Restrictions: can use R UE to guide RW not bear weight, gentle ADLs per ortho MD       Mobility Bed Mobility               General bed mobility comments: OOB in recliner    Transfers Overall transfer level: Needs assistance Equipment used: 1 person hand held assist Transfers: Sit to/from Stand Sit to Stand: Min assist           General transfer comment: min A for support of L UE with power up into standing     Balance Overall balance assessment: Needs assistance Sitting-balance support: Feet supported;Single extremity supported;No upper extremity supported Sitting balance-Leahy Scale: Good     Standing balance support: Single extremity supported;During functional activity Standing balance-Leahy Scale: Poor Standing balance comment: able to static stand without assist however requires L UE for dynamic activity                           ADL either performed or assessed with clinical judgement   ADL Overall ADL's : Needs assistance/impaired                 Upper Body Dressing : Moderate assistance;Sitting;Cueing for sequencing;Cueing for compensatory techniques;Cueing for UE precautions Upper Body Dressing Details (indicate cue type and reason): patient able to don tank top and button down shirt adhering to appropriate precautions Lower Body Dressing: Sitting/lateral leans;Sit to/from stand;Minimal assistance Lower Body Dressing Details (indicate cue type and reason): patient able to don pants (set up), socks (set up), and shoes (min A due to no shoe horn with pt uses at home), maintaining adherence to precautions             Functional  mobility during ADLs: Cueing for safety;Cueing for sequencing;Minimal assistance General ADL Comments: Patient able to complete entire dressing routine adhering to shoulder precautions in preparation for discharge    Extremity/Trunk Assessment              Vision       Perception     Praxis       Cognition Arousal/Alertness: Awake/alert Behavior During Therapy: WFL for tasks assessed/performed Overall Cognitive Status: Within Functional Limits for tasks assessed                                            Exercises     Shoulder Instructions       General Comments appropriate HR response to activity, at rest 87bpm, max noted with ambulatio 128bpm, returned to 92 bpm with return to recliner    Pertinent Vitals/ Pain       Pain Assessment: Faces Faces Pain Scale: Hurts a little bit Pain Location: R UE Pain Descriptors / Indicators: Grimacing;Guarding Pain Intervention(s): Limited activity within patient's tolerance;Monitored during session;Repositioned  Home Living                                          Prior Functioning/Environment              Frequency  Min 2X/week        Progress Toward Goals  OT Goals(current goals can now be found in the care plan section)  Progress towards OT goals: Progressing toward goals  Acute Rehab OT Goals Patient Stated Goal: go home OT Goal Formulation: With patient Time For Goal Achievement: 05/20/21 Potential to Achieve Goals: Good  Plan Discharge plan remains appropriate;Frequency remains appropriate    Co-evaluation                 AM-PAC OT "6 Clicks" Daily Activity     Outcome Measure   Help from another person eating meals?: A Little Help from another person taking care of personal grooming?: A Little Help from another person toileting, which includes using toliet, bedpan, or urinal?: A Little Help from another person bathing (including washing, rinsing, drying)?: A Little Help from another person to put on and taking off regular upper body clothing?: A Little Help from another person to put on and taking off regular lower body clothing?: A Little 6 Click Score: 18    End of Session Equipment Utilized During Treatment: Other (comment) (shoulder handout, ADL  dressing task)  OT Visit Diagnosis: Unsteadiness on feet (R26.81);Other abnormalities of gait and mobility (R26.89);Muscle weakness (generalized) (M62.81);Pain Pain - Right/Left: Right Pain - part of body: Arm;Shoulder   Activity Tolerance Patient tolerated treatment well   Patient Left in chair;with call bell/phone within reach   Nurse Communication Mobility status        Time: 0981-1914 OT Time Calculation (min): 34 min  Charges: OT General Charges $OT Visit: 1 Visit OT Treatments $Self Care/Home Management : 23-37 mins  Wray. Marlayna Bannister, COTA/L Acute Rehabilitation Services [place] 05/11/2021, 2:54 PM

## 2021-05-11 NOTE — Progress Notes (Addendum)
Inpatient Diabetes Program Recommendations  AACE/ADA: New Consensus Statement on Inpatient Glycemic Control (2015)  Target Ranges:  Prepandial:   less than 140 mg/dL      Peak postprandial:   less than 180 mg/dL (1-2 hours)      Critically ill patients:  140 - 180 mg/dL   Lab Results  Component Value Date   GLUCAP 362 (H) 05/11/2021   HGBA1C 8.6 (H) 05/03/2021    Review of Glycemic Control  Latest Reference Range & Units 05/10/21 16:22 05/10/21 21:40 05/11/21 07:42  Glucose-Capillary 70 - 99 mg/dL 168 (H) 281 (H) 362 (H)  (H): Data is abnormally high Diabetes history: type 2 Outpatient Diabetes medications: 50/50 insulin 5 units daily, Glumetza 500 mg daily Current orders for Inpatient glycemic control: Levemir 10 units daily, Novolog 0-15 units TID   Inpatient Diabetes Program Recommendations:    Noted consult. Sent secure chat to RN to determine timing of patient's intake.  Tradjenta 5 mg QD? Will await further recommendations following CBG at lunchtime given patient has now received Novolog 15 units from [facility] and Levemir 10 units QD.  Addendum: If patient to remain inpatient: consider changing correction to Novolog 0-6 units TID & HS and change Levemir to 6 units BID (to start in AM on 12/20).  Thanks, Bronson Curb, MSN, RNC-OB Diabetes Coordinator [phone] (8a-5p)

## 2021-05-11 NOTE — TOC Progression Note (Signed)
Transition of Care [facility]) - Progression Note    Patient Details  Name: Tara Bradley MRN: [id number] Date of Birth: [birth year]  Transition of Care [facility] CM/SW [place], [place] Phone Number: 05/11/2021, 11:44 AM  Clinical Narrative:     CSW received insurance authorization approval [place] ID# [id number]. Insurance authorization approval is from 12/16-12/20. CSW informed Sharyn Lull with [facility]. Sharyn Lull confirmed she can accept patient today if medically ready. CSW informed MD and updated patient. CSW will continue to follow and assist with patients dc planning needs.  Expected Discharge Plan: [place] Barriers to Discharge: Continued Medical Work up  Expected Discharge Plan and Services Expected Discharge Plan: [place] arrangements for the past 2 months: Apartment Expected Discharge Date: 05/11/21                                     Social Determinants of Health (SDOH) Interventions    Readmission Risk Interventions No flowsheet data found.

## 2021-05-11 NOTE — Discharge Summary (Signed)
Physician Discharge Summary  Tara Bradley:[id number] DOB: [birth year] DOA: 05/02/2021  PCP: [name], MD  Admit date: 05/02/2021 Discharge date: 05/11/2021  Admitted From: Home Disposition:  Home  Discharge Condition:Stable CODE STATUS:FULL Diet recommendation: Carb Modified   Brief/Interim Summary:  Patient is a 87 year old female with history of chronic A. fib on Xarelto, hypertension, hyperlipidemia, insomnia who presented here with complaints of right shoulder pain after falling at home.  She was also found to be in rapid A. fib in presentation.  BP was soft and she was hypotensive.  X-ray showed severely displaced fracture of the right humerus with old acromial fracture.  She underwent reverse shoulder arthroplasty on 12/12.  Hospital was remarkable for A. fib.  Cardiology consulted and following.  Hospital course remarkable for persistent A. fib with RVR now better controlled.  Cardizem drip changed to oral.  PT/OT recommended skilled nursing facility on discharge.  She needs to follow-up with orthopedics, cardiology as an outpatient.  Medically stable for discharge to skilled nursing facility today.  Following problems were addressed during her hospitalization:  Chronic A. Fib/rapid A. fib: cardiology were following.  She will be discharged on metoprolol, Cardizem, digoxin.  On Xarelto for anticoagulation.  Follow-up with cardiology as an outpatient.   Traumatic acute displaced, closed right humerus fracture: Status post reverse shoulder arthroplasty on 12/12 by Dr. Alma Friendly.  Continue pain management, supportive care.  Follow-up with orthopedics as an outpatient.  No weightbearing   Acute on chronic diastolic congestive heart failure: Volume overloaded after surgery.  Started on Lasix IV 40 mg ,now on hold.  Continue Lasix 20 mg daily. cardiology following.   Acute hypoxic respiratory failure: She was on 2 L of oxygen ,now weaned to room air.  Thought to be secondary to  fluid overload/atelectasis. She was given IV Lasix.  .  Not on oxygen at home. CXR on 12/14 showed small right pleural effusion and right greater than left basilar airspace disease: Likely atelectasis, no evidence of pneumonia clinically   Type 2 diabetes: A1c of 8.6 indicating poor control.  On metformin and insulin kwikpen at home. Continue current insulin regimen.  Her blood sugars should  be closely monitored and her insulin should be titrated as needed  Hypokalemia: Supplemented and corrected   History of aortic dissection: Chronic.  Stable   Insomnia: On Trazodone   Fall/deconditioning/debility/advanced age: PT/OT  recommended skilled nursing facility on discharge.   Discharge Diagnoses:  Principal Problem:   Right humeral fracture Active Problems:   DM type 2, controlled, with complication (HCC)   Persistent atrial fibrillation with rapid ventricular response (HCC)   Osteoporosis   Chronic anticoagulation   Hyperlipidemia   Chronic insomnia   Chronic congestive heart failure with left ventricular diastolic dysfunction (HCC)   Acute on chronic diastolic heart failure ([place])   Fall at home, initial encounter   Atrial fibrillation with RVR ([place])   S/P shoulder replacement, right    Discharge Instructions  Discharge Instructions     Diet Carb Modified   Complete by: As directed    Discharge instructions   Complete by: As directed    1)Please take prescribed medications as instructed 2)Follow up with cardiology, orthopedics as an outpatient.  Name and number of the provider group has been attached 3)Monitor blood sugars. Continue the diabetic medications   Increase activity slowly   Complete by: As directed    No wound care   Complete by: As directed       Allergies  as of 05/11/2021       Reactions   Ambien [zolpidem Tartrate]    Hallucinations   Codeine Other (See Comments)   headache   Codeine Sulfate    REACTION: unspecified   Latex Hives   Remeron  [mirtazapine]         Medication List     STOP taking these medications    losartan-hydrochlorothiazide 100-12.5 MG tablet Commonly known as: HYZAAR   metoprolol tartrate 25 MG tablet Commonly known as: LOPRESSOR   temazepam 15 MG capsule Commonly known as: RESTORIL       TAKE these medications    acetaminophen 325 MG tablet Commonly known as: TYLENOL Take 2 tablets (650 mg total) by mouth every 6 (six) hours. What changed:  when to take this reasons to take this   BD Pen Needle Nano U/F 32G X 4 MM Misc Generic drug: Insulin Pen Needle Inject into the skin daily.   Calcium Carbonate-Vitamin D 600-400 MG-UNIT tablet Take 1 tablet by mouth daily.   digoxin 0.125 MG tablet Commonly known as: LANOXIN Take 1 tablet (0.125 mg total) by mouth daily. Start taking on: May 12, 2021 What changed: See the new instructions.   diltiazem 360 MG 24 hr capsule Commonly known as: CARDIZEM CD Take 1 capsule (360 mg total) by mouth daily. Start taking on: May 12, 2021 What changed:  medication strength how much to take   furosemide 20 MG tablet Commonly known as: LASIX Take 1 tablet (20 mg total) by mouth daily. Start taking on: May 12, 2021 What changed:  medication strength how much to take   HumaLOG Mix 50/50 KwikPen (50-50) 100 UNIT/ML Kwikpen Generic drug: Insulin Lispro Prot & Lispro Inject 5 Units into the skin daily.   metFORMIN 500 MG (MOD) 24 hr tablet Commonly known as: GLUMETZA Take 1,000 mg by mouth every evening.   metoprolol succinate 25 MG 24 hr tablet Commonly known as: TOPROL-XL Take 3 tablets (75 mg total) by mouth 2 (two) times daily.   polyethylene glycol 17 g packet Commonly known as: MIRALAX / GLYCOLAX Take 17 g by mouth daily. Start taking on: May 12, 2021   potassium chloride 10 MEQ tablet Commonly known as: KLOR-CON Take 10 mEq by mouth daily.   senna-docusate 8.6-50 MG tablet Commonly known as:  Senokot-S Take 1 tablet by mouth 2 (two) times daily.   traMADol 50 MG tablet Commonly known as: ULTRAM Take 1 tablet (50 mg total) by mouth every 6 (six) hours as needed.   traZODone 50 MG tablet Commonly known as: DESYREL Take 1 tablet (50 mg total) by mouth at bedtime as needed for sleep.   Vitamin D 50 MCG (2000 UT) tablet Take 1,000 Units by mouth daily.   Xarelto 15 MG Tabs tablet Generic drug: Rivaroxaban TAKE 1 TABLET ONCE DAILY WITH SUPPER. What changed: See the new instructions.        Follow-up Information     [facility]. Schedule an appointment as soon as possible for a visit .   Specialty: Specialist Why: As needed Contact information: [address] [place] [address] [phone]         Netta Cedars, MD. Call in 2 week(s).   Specialty: Orthopedic Surgery Why: call [phone] for appt Contact information: [address] [place] [address] [phone]         [name], MD Follow up.   Specialty: Cardiology Why: office will call with date and time  of appointment. if you did not head back in few days after discharge, Please give a call to make appointment Contact information: [address] [place] [address] [phone]                Allergies  Allergen Reactions   Ambien [Zolpidem Tartrate]     Hallucinations    Codeine Other (See Comments)    headache   Codeine Sulfate     REACTION: unspecified   Latex Hives   Remeron [Mirtazapine]     Consultations: Cardiology   Procedures/Studies: CT Head Wo Contrast  Result Date: 05/02/2021 CLINICAL DATA:  87 year old female status post fall on blood thinners. EXAM: CT HEAD WITHOUT CONTRAST TECHNIQUE: Contiguous axial images were obtained from the base of the skull through the vertex without intravenous contrast. COMPARISON:  Head CT 10/19/2019. FINDINGS: Brain: Stable cerebral volume from last year. No midline shift,  ventriculomegaly, mass effect, evidence of mass lesion, intracranial hemorrhage or evidence of cortically based acute infarction. Stable gray-white matter differentiation throughout the brain. Patchy and confluent bilateral cerebral white matter hypodensity with mild heterogeneity of the deep gray nuclei. Vascular: Calcified atherosclerosis at the skull base. No suspicious intracranial vascular hyperdensity. Skull: Stable, intact. Sinuses/Orbits: Visualized paranasal sinuses and mastoids are stable and well aerated. Other: Proximal right humerus fracture evident on CT scout view, see dedicated right humerus series today. No orbit or scalp soft tissue injury identified. IMPRESSION: No acute intracranial abnormality. No acute traumatic injury to the head. Electronically Signed   By: Genevie Ann M.D.   On: 05/02/2021 07:07   CT Cervical Spine Wo Contrast  Result Date: 05/02/2021 CLINICAL DATA:  87 year old female status post fall on blood thinners. EXAM: CT CERVICAL SPINE WITHOUT CONTRAST TECHNIQUE: Multidetector CT imaging of the cervical spine was performed without intravenous contrast. Multiplanar CT image reconstructions were also generated. COMPARISON:  Head CT today.  Cervical spine CT 10/19/2019. FINDINGS: Alignment: Stable. Trace anterolisthesis of C7 on T1. Bilateral posterior element alignment is within normal limits. Skull base and vertebrae: Visualized skull base is intact. No atlanto-occipital dissociation. Osteopenia. Stable C1-C2 alignment, and those levels appear stable and intact. No acute osseous abnormality identified. Soft tissues and spinal canal: No prevertebral fluid or swelling. No visible canal hematoma. Partially retropharyngeal carotids with bilateral calcified atherosclerosis. Otherwise negative for age visible noncontrast neck soft tissues. Disc levels: Capacious spinal canal despite widespread chronic cervical spine degeneration. Chronic interbody ankylosis C5-C6. Upper chest: Visible  upper thoracic levels appears stable. Negative lung apices. IMPRESSION: 1. No acute traumatic injury identified in the cervical spine. 2. Chronic cervical spine degeneration. Electronically Signed   By: Genevie Ann M.D.   On: 05/02/2021 07:10   CT PELVIS WO CONTRAST  Result Date: 05/03/2021 CLINICAL DATA:  Pelvic fracture. EXAM: CT PELVIS WITHOUT CONTRAST TECHNIQUE: Multidetector CT imaging of the pelvis was performed following the standard protocol without intravenous contrast. COMPARISON:  CT abdomen pelvis dated 09/28/2016. FINDINGS: Evaluation of this exam is limited in the absence of intravenous contrast. Urinary Tract:  No abnormality visualized. Bowel: Large amount of stool throughout the colon. No bowel dilatation or inflammation. The appendix is normal. Vascular/Lymphatic: Advanced aortoiliac atherosclerotic disease. The IVC is unremarkable no adenopathy. Reproductive:  Hysterectomy. Other:  Partially visualized layering stones within the gallbladder. Musculoskeletal: Severe osteopenia. Old healed bilateral pubic bone fractures. Partially visualized right femoral intramedullary nail. The visualized hardware appears intact. No acute osseous pathology. IMPRESSION: 1. No acute osseous pathology. Old healed bilateral pubic bone fractures.  2. Large amount of stool throughout the colon. 3. Cholelithiasis. 4. Aortic Atherosclerosis (ICD10-I70.0). Electronically Signed   By: Anner Crete M.D.   On: 05/03/2021 01:19   DG Pelvis Portable  Result Date: 05/02/2021 CLINICAL DATA:  Fall. EXAM: PORTABLE PELVIS 1-2 VIEWS COMPARISON:  March 15, 2017. FINDINGS: Status post intramedullary rod fixation of right femur. Old fractures are seen involving bilateral superior and inferior pubic rami. No definite acute fracture or dislocation is noted. IMPRESSION: Old postsurgical and posttraumatic changes as described above. No acute abnormality is noted. Electronically Signed   By: Marijo Conception M.D.   On: 05/02/2021  06:48   DG CHEST PORT 1 VIEW  Result Date: 05/06/2021 CLINICAL DATA:  Shortness of breath following shoulder surgery. EXAM: PORTABLE CHEST 1 VIEW COMPARISON:  One-view chest x-ray 05/02/2021.  CTA chest 03/12/17 FINDINGS: Heart is enlarged. Atherosclerotic changes are noted in the arch. Scoliosis is present. A small right pleural effusion is present. Right greater than left basilar airspace disease is present. No other significant airspace disease is present. Bilateral rib fractures noted. Patient is status post right shoulder arthroplasty. Fluid gas are present in the joint space. IMPRESSION: Small right pleural effusion and right greater than left basilar airspace disease. While this likely reflects atelectasis, infection is not excluded. Electronically Signed   By: San Morelle M.D.   On: 05/06/2021 12:46   DG Chest Portable 1 View  Result Date: 05/02/2021 CLINICAL DATA:  Fall. EXAM: PORTABLE CHEST 1 VIEW COMPARISON:  March 10, 2017. FINDINGS: Stable cardiomegaly. Old left rib fractures are noted. Lungs are clear. IMPRESSION: No acute cardiopulmonary abnormality seen. Aortic Atherosclerosis (ICD10-I70.0). Electronically Signed   By: Marijo Conception M.D.   On: 05/02/2021 06:43   DG Shoulder Right Port  Result Date: 05/04/2021 CLINICAL DATA:  Shoulder replacements EXAM: PORTABLE RIGHT SHOULDER COMPARISON:  05/02/2021, 03/10/2017 FINDINGS: Interval right shoulder replacement with normal alignment. Residual displaced acromial fracture fragment is noted. Old right-sided rib fractures. IMPRESSION: 1. Interval right shoulder replacement with normal alignment 2. Old acromial fracture fragment redemonstrated. Electronically Signed   By: Donavan Foil M.D.   On: 05/04/2021 21:25   DG Humerus Right  Result Date: 05/02/2021 CLINICAL DATA:  Fall. EXAM: RIGHT HUMERUS - 2+ VIEW COMPARISON:  March 10, 2017. FINDINGS: Severely displaced fracture is seen involving the proximal right humeral neck of  indeterminate age. Old displaced acromial fracture is noted as well. IMPRESSION: Severely displaced proximal right humeral neck fracture of indeterminate age. Old displaced acromial fracture is noted as well. Electronically Signed   By: Marijo Conception M.D.   On: 05/02/2021 06:46   DG MINI C-ARM IMAGE ONLY  Result Date: 05/04/2021 There is no interpretation for this exam.  This order is for images obtained during a surgical procedure.  Please See "Surgeries" Tab for more information regarding the procedure.      Subjective:  Patient seen and examined at the bedside this morning.  Hemodynamically stable for discharge today  Discharge Exam: Vitals:   05/11/21 0950 05/11/21 0952  BP: 111/74   Pulse: 97 97  Resp:    Temp:    SpO2:     Vitals:   05/11/21 0214 05/11/21 0415 05/11/21 0950 05/11/21 0952  BP: 123/75 (!) 100/54 111/74   Pulse:  (!) 149 97 97  Resp:      Temp:  98.6 F (37 C)    TempSrc:  Oral    SpO2:  90%    Weight:  64.3  kg    Height:        General: Pt is alert, awake, not in acute distress Cardiovascular: Irregularly irregular rhythm , no rubs, no gallops Respiratory: CTA bilaterally, no wheezing, no rhonchi Abdominal: Soft, NT, ND, bowel sounds + Extremities: no edema, no cyanosis, sling on the right arm    The results of significant diagnostics from this hospitalization (including imaging, microbiology, ancillary and laboratory) are listed below for reference.     Microbiology: Recent Results (from the past 240 hour(s))  Resp Panel by RT-PCR (Flu A&B, Covid) Nasopharyngeal Swab     Status: None   Collection Time: 05/02/21  8:49 AM   Specimen: Nasopharyngeal Swab; Nasopharyngeal(NP) swabs in vial transport medium  Result Value Ref Range Status   SARS Coronavirus 2 by RT PCR NEGATIVE NEGATIVE Final    Comment: (NOTE) SARS-CoV-2 target nucleic acids are NOT DETECTED.  The SARS-CoV-2 RNA is generally detectable in upper respiratory specimens during  the acute phase of infection. The lowest concentration of SARS-CoV-2 viral copies this assay can detect is 138 copies/mL. A negative result does not preclude SARS-Cov-2 infection and should not be used as the sole basis for treatment or other patient management decisions. A negative result may occur with  improper specimen collection/handling, submission of specimen other than nasopharyngeal swab, presence of viral mutation(s) within the areas targeted by this assay, and inadequate number of viral copies(<138 copies/mL). A negative result must be combined with clinical observations, patient history, and epidemiological information. The expected result is Negative.  Fact Sheet for Patients:  EntrepreneurPulse.com.au  Fact Sheet for Healthcare Providers:  IncredibleEmployment.be  This test is no t yet approved or cleared by the [place] FDA and  has been authorized for detection and/or diagnosis of SARS-CoV-2 by FDA under an Emergency Use Authorization (EUA). This EUA will remain  in effect (meaning this test can be used) for the duration of the COVID-19 declaration under Section 564(b)(1) of the Act, 21 U.S.C.section 360bbb-3(b)(1), unless the authorization is terminated  or revoked sooner.       Influenza A by PCR NEGATIVE NEGATIVE Final   Influenza B by PCR NEGATIVE NEGATIVE Final    Comment: (NOTE) The Xpert Xpress SARS-CoV-2/FLU/RSV plus assay is intended as an aid in the diagnosis of influenza from Nasopharyngeal swab specimens and should not be used as a sole basis for treatment. Nasal washings and aspirates are unacceptable for Xpert Xpress SARS-CoV-2/FLU/RSV testing.  Fact Sheet for Patients: EntrepreneurPulse.com.au  Fact Sheet for Healthcare Providers: IncredibleEmployment.be  This test is not yet approved or cleared by the [place] FDA and has been authorized for detection and/or  diagnosis of SARS-CoV-2 by FDA under an Emergency Use Authorization (EUA). This EUA will remain in effect (meaning this test can be used) for the duration of the COVID-19 declaration under Section 564(b)(1) of the Act, 21 U.S.C. section 360bbb-3(b)(1), unless the authorization is terminated or revoked.  Performed at [facility], [place] [address] [place], [place] [address]   Surgical PCR screen     Status: None   Collection Time: 05/04/21  8:28 AM   Specimen: Nasal Mucosa; Nasal Swab  Result Value Ref Range Status   MRSA, PCR NEGATIVE NEGATIVE Final   Staphylococcus aureus NEGATIVE NEGATIVE Final    Comment: (NOTE) The Xpert SA Assay (FDA approved for NASAL specimens in patients 62 years of age and older), is one component of a comprehensive surveillance program. It is not intended to diagnose infection nor to guide  or monitor treatment. Performed at [facility], [place] [address] [place], [place] [address]      Labs: BNP (last 3 results) Recent Labs    05/07/21 0126  BNP 315.4*   Basic Metabolic Panel: Recent Labs  Lab 05/05/21 0413 05/06/21 0310 05/07/21 0126 05/08/21 0747 05/10/21 0204  NA 134* 132* 135 133* 136  K 5.0 4.3 4.5 4.2 3.9  CL 101 97* 101 97* 99  CO2 22 26 27 27 28   GLUCOSE 313* 238* 202* 251* 67*  BUN 11 18 23 19 17   CREATININE 0.83 0.76 0.83 0.74 0.67  CALCIUM 8.4* 8.6* 8.4* 8.7* 9.1  PHOS  --   --   --  2.7  --    Liver Function Tests: Recent Labs  Lab 05/08/21 0747  ALBUMIN 2.9*   No results for input(s): LIPASE, AMYLASE in the last 168 hours. No results for input(s): AMMONIA in the last 168 hours. CBC: Recent Labs  Lab 05/05/21 0413 05/06/21 0310 05/07/21 0126 05/09/21 2111  WBC 9.7 9.9 7.5  --   HGB 11.4* 10.5* 11.1* 10.7*  HCT 34.8* 31.7* 33.6* 31.2*  MCV 97.2 96.6 98.2  --   PLT 170 185 180  --    Cardiac Enzymes: No results for input(s): CKTOTAL, CKMB, CKMBINDEX, TROPONINI in the last 168 hours. BNP: Invalid  input(s): POCBNP CBG: Recent Labs  Lab 05/10/21 0719 05/10/21 1112 05/10/21 1622 05/10/21 2140 05/11/21 0742  GLUCAP 107* 269* 168* 281* 362*   D-Dimer No results for input(s): DDIMER in the last 72 hours. Hgb A1c No results for input(s): HGBA1C in the last 72 hours. Lipid Profile No results for input(s): CHOL, HDL, LDLCALC, TRIG, CHOLHDL, LDLDIRECT in the last 72 hours. Thyroid function studies No results for input(s): TSH, T4TOTAL, T3FREE, THYROIDAB in the last 72 hours.  Invalid input(s): FREET3 Anemia work up No results for input(s): VITAMINB12, FOLATE, FERRITIN, TIBC, IRON, RETICCTPCT in the last 72 hours. Urinalysis    Component Value Date/Time   COLORURINE YELLOW 05/03/2021 0645   APPEARANCEUR CLEAR 05/03/2021 0645   LABSPEC 1.021 05/03/2021 0645   PHURINE 6.0 05/03/2021 0645   GLUCOSEU 150 (A) 05/03/2021 0645   HGBUR NEGATIVE 05/03/2021 0645   BILIRUBINUR NEGATIVE 05/03/2021 0645   BILIRUBINUR n 07/23/2013 1343   KETONESUR 5 (A) 05/03/2021 0645   PROTEINUR NEGATIVE 05/03/2021 0645   UROBILINOGEN 0.2 07/23/2013 1343   UROBILINOGEN 0.2 03/07/2012 2123   NITRITE NEGATIVE 05/03/2021 0645   LEUKOCYTESUR NEGATIVE 05/03/2021 0645   Sepsis Labs Invalid input(s): PROCALCITONIN,  WBC,  LACTICIDVEN Microbiology Recent Results (from the past 240 hour(s))  Resp Panel by RT-PCR (Flu A&B, Covid) Nasopharyngeal Swab     Status: None   Collection Time: 05/02/21  8:49 AM   Specimen: Nasopharyngeal Swab; Nasopharyngeal(NP) swabs in vial transport medium  Result Value Ref Range Status   SARS Coronavirus 2 by RT PCR NEGATIVE NEGATIVE Final    Comment: (NOTE) SARS-CoV-2 target nucleic acids are NOT DETECTED.  The SARS-CoV-2 RNA is generally detectable in upper respiratory specimens during the acute phase of infection. The lowest concentration of SARS-CoV-2 viral copies this assay can detect is 138 copies/mL. A negative result does not preclude SARS-Cov-2 infection and  should not be used as the sole basis for treatment or other patient management decisions. A negative result may occur with  improper specimen collection/handling, submission of specimen other than nasopharyngeal swab, presence of viral mutation(s) within the areas targeted by this assay, and inadequate number of  viral copies(<138 copies/mL). A negative result must be combined with clinical observations, patient history, and epidemiological information. The expected result is Negative.  Fact Sheet for Patients:  EntrepreneurPulse.com.au  Fact Sheet for Healthcare Providers:  IncredibleEmployment.be  This test is no t yet approved or cleared by the [place] FDA and  has been authorized for detection and/or diagnosis of SARS-CoV-2 by FDA under an Emergency Use Authorization (EUA). This EUA will remain  in effect (meaning this test can be used) for the duration of the COVID-19 declaration under Section 564(b)(1) of the Act, 21 U.S.C.section 360bbb-3(b)(1), unless the authorization is terminated  or revoked sooner.       Influenza A by PCR NEGATIVE NEGATIVE Final   Influenza B by PCR NEGATIVE NEGATIVE Final    Comment: (NOTE) The Xpert Xpress SARS-CoV-2/FLU/RSV plus assay is intended as an aid in the diagnosis of influenza from Nasopharyngeal swab specimens and should not be used as a sole basis for treatment. Nasal washings and aspirates are unacceptable for Xpert Xpress SARS-CoV-2/FLU/RSV testing.  Fact Sheet for Patients: EntrepreneurPulse.com.au  Fact Sheet for Healthcare Providers: IncredibleEmployment.be  This test is not yet approved or cleared by the [place] FDA and has been authorized for detection and/or diagnosis of SARS-CoV-2 by FDA under an Emergency Use Authorization (EUA). This EUA will remain in effect (meaning this test can be used) for the duration of the COVID-19 declaration  under Section 564(b)(1) of the Act, 21 U.S.C. section 360bbb-3(b)(1), unless the authorization is terminated or revoked.  Performed at [facility], [place] [address] [place], [place] [address]   Surgical PCR screen     Status: None   Collection Time: 05/04/21  8:28 AM   Specimen: Nasal Mucosa; Nasal Swab  Result Value Ref Range Status   MRSA, PCR NEGATIVE NEGATIVE Final   Staphylococcus aureus NEGATIVE NEGATIVE Final    Comment: (NOTE) The Xpert SA Assay (FDA approved for NASAL specimens in patients 46 years of age and older), is one component of a comprehensive surveillance program. It is not intended to diagnose infection nor to guide or monitor treatment. Performed at [facility], [place] [address] [place], [place] [address]     Please note: You were cared for by a hospitalist during your hospital stay. Once you are discharged, your primary care physician will handle any further medical issues. Please note that NO REFILLS for any discharge medications will be authorized once you are discharged, as it is imperative that you return to your primary care physician (or establish a relationship with a primary care physician if you do not have one) for your post hospital discharge needs so that they can reassess your need for medications and monitor your lab values.    Time coordinating discharge: 40 minutes  SIGNED:   Shelly Coss, MD  [facility] 05/11/2021, 11:03 AM Pager [phone]  If 7PM-7AM, please contact night-coverage [email or web address] Password [email or web address]

## 2021-05-11 NOTE — Progress Notes (Signed)
Hypoglycemic event occurred. Blood sugar 61. Hypoglycemic protocol followed. Blood sugar rechecked in 15 mins. When rechecked, blood sugar was 118.

## 2021-05-11 NOTE — Care Management Important Message (Signed)
Important Message  Patient Details  Name: Tara Bradley MRN: [id number] Date of Birth: Oct 05, 1922   Medicare Important Message Given:  Yes     [name] 05/11/2021, 10:13 AM

## 2021-05-11 NOTE — Progress Notes (Signed)
Hypoglycemic Event  CBG: 61  Treatment: 4 oz juice/soda  Symptoms: None  Follow-up CBG: Time:1225 CBG Result:118  Possible Reasons for Event: Other: Blood was checked after patient ate. Hypoglycemic protocol followed.  Comments/MD notified:MD made aware.    Kandis Ban

## 2021-05-11 NOTE — Progress Notes (Signed)
Mobility Specialist Progress Note    05/11/21 1121  Mobility  Activity Ambulated in hall  Level of Assistance Minimal assist, patient does 75% or more  Assistive Device  (IV pole, HHA)  Distance Ambulated (ft) 460 ft  Mobility Ambulated with assistance in hallway  Mobility Response Tolerated fair  Mobility performed by Mobility specialist  Bed Position Chair  $Mobility charge 1 Mobility   Post-mobility: 95% SpO2  Pt received in chair and agreeable. C/o a little SOB during walk while holding conversation. Ambulated on RA. Returned to chair with call bell in reach.   [facility] Mobility Specialist  M.S. Primary Phone: [phone] M.S. Secondary Phone: [phone]

## 2021-05-11 NOTE — Progress Notes (Signed)
Physical Therapy Treatment Patient Details Name: Tara Bradley MRN: [id number] DOB: 25-Nov-1922 Today's Date: 05/11/2021   History of Present Illness This 87 y.o. female admitted 05/02/21 after sustaining a fall.  She does endorse hitting her head, and was found to have severely displaced proximal right humeral neck fracture of indeterminate age along with old displaced acromial fracture.   She was also found to have A-Fib with RVR with rates in 150s and hypoxia with ambulation requiring 2L 02. s/p 05/04/21 R total reverse arthroplasty. PMH includes: arthritis, aortic valve stenosis, bradycardia, DM, HTN, A-Fib, syncope, ho ulnar fx    PT Comments    Pt making good progress towards her goals today. HR is better controlled especially with activity. Pt is min A for transfers and min guard to supervision with ambulation utilizing RW. Pt able to adhere to R shoulder precautions using R arm only to guide RW with no weightbearing. Pt is scheduled for discharge to SNF today.     Recommendations for follow up therapy are one component of a multi-disciplinary discharge planning process, led by the attending physician.  Recommendations may be updated based on patient status, additional functional criteria and insurance authorization.  Follow Up Recommendations  Skilled nursing-short term rehab (<3 hours/day)        Equipment Recommendations  Other (comment) (TBA)       Precautions / Restrictions Precautions Precautions: Fall Precaution Comments: displaced R humerus fx; monitor HR Required Braces or Orthoses: Sling Restrictions Weight Bearing Restrictions: Yes RUE Weight Bearing: Non weight bearing Other Position/Activity Restrictions: can use R UE to guide RW not bear weight, gentle ADLs per ortho MD     Mobility  Bed Mobility               General bed mobility comments: OOB in recliner    Transfers Overall transfer level: Needs assistance Equipment used: 1 person hand held  assist Transfers: Sit to/from Stand Sit to Stand: Min assist           General transfer comment: min A for support of L UE with power up into standing    Ambulation/Gait Ambulation/Gait assistance: Min guard;Supervision Gait Distance (Feet): 450 Feet Assistive device: Rolling walker (2 wheels) Gait Pattern/deviations: Step-through pattern;Decreased stride length Gait velocity: reduced Gait velocity interpretation: <1.31 ft/sec, indicative of household ambulator   General Gait Details: min guard progressing to supervision for slowed, steady gait, 1x standing rest break for 4/4 DoE, pt able to guide RW with R UE without WB          Balance Overall balance assessment: Needs assistance Sitting-balance support: Feet supported;Single extremity supported;No upper extremity supported Sitting balance-Leahy Scale: Good     Standing balance support: Single extremity supported;During functional activity Standing balance-Leahy Scale: Poor Standing balance comment: able to static stand without assist however requires L UE for dynamic activity                            Cognition Arousal/Alertness: Awake/alert Behavior During Therapy: WFL for tasks assessed/performed Overall Cognitive Status: Within Functional Limits for tasks assessed                                             General Comments General comments (skin integrity, edema, etc.): appropriate HR response to activity, at rest 87bpm, max noted with ambulatio  128bpm, returned to 92 bpm with return to recliner      Pertinent Vitals/Pain Pain Assessment: Faces Faces Pain Scale: Hurts a little bit Pain Location: R UE Pain Descriptors / Indicators: Grimacing;Guarding Pain Intervention(s): Limited activity within patient's tolerance;Monitored during session;Repositioned     PT Goals (current goals can now be found in the care plan section) Acute Rehab PT Goals PT Goal Formulation: With  patient Time For Goal Achievement: 05/17/21 Potential to Achieve Goals: Good Progress towards PT goals: Progressing toward goals    Frequency    Min 3X/week      PT Plan Discharge plan needs to be updated       AM-PAC PT "6 Clicks" Mobility   Outcome Measure  Help needed turning from your back to your side while in a flat bed without using bedrails?: A Little Help needed moving from lying on your back to sitting on the side of a flat bed without using bedrails?: A Little Help needed moving to and from a bed to a chair (including a wheelchair)?: A Little Help needed standing up from a chair using your arms (e.g., wheelchair or bedside chair)?: A Little Help needed to walk in hospital room?: A Little Help needed climbing 3-5 steps with a railing? : A Lot 6 Click Score: 17    End of Session Equipment Utilized During Treatment: Gait belt Activity Tolerance: Patient tolerated treatment well Patient left: with call bell/phone within reach;in bed;with bed alarm set Nurse Communication: Mobility status PT Visit Diagnosis: Other abnormalities of gait and mobility (R26.89);History of falling (Z91.81);Difficulty in walking, not elsewhere classified (R26.2);Pain Pain - Right/Left: Right Pain - part of body: Arm     Time: 3790-2409 PT Time Calculation (min) (ACUTE ONLY): 21 min  Charges:  $Gait Training: 8-22 mins                     [name] B. Migdalia Dk PT, DPT Acute Rehabilitation Services Pager [phone] Office [phone]    [place] 05/11/2021, 2:00 PM

## 2021-05-11 NOTE — Plan of Care (Signed)

## 2021-05-11 NOTE — Consult Note (Signed)
° °  [facility] CM Inpatient Consult   05/11/2021  Tara Bradley Nov 22, 1922 [id number]  [employer]  Primary Care Provider:  [name], MD [place], embedded,   Patient recommended for a skilled nursing level of care for transition.  No THN CM needs, will sign off, needs to be met at the skilled nursing facility for transition.  Of note, [facility] Care Management services does not replace or interfere with any services that are arranged by inpatient Transition of Care [TOC] team     For additional questions or referrals please contact:    Natividad Brood, RN BSN [facility] Liaison  [phone] business mobile phone Toll free office [phone]  Fax number: [phone] [email or web address]  [email or web address]

## 2021-05-11 NOTE — TOC Transition Note (Signed)
Transition of Care [facility]) - CM/SW Discharge Note   Patient Details  Name: Tara Bradley MRN: [id number] Date of Birth: 1922/12/30  [facility] CM/SW Contact:  Milas Gain, [place] Phone Number: 05/11/2021, 12:06 PM   Clinical Narrative:     Patient will DC to: [facility]  Anticipated DC date: 05/11/2021  Family notified: Rush Landmark  Transport by: Corey Harold  ?  Per MD patient ready for DC to [facility] . RN, patient, patient's family, and facility notified of DC. Discharge Summary sent to facility. RN given number for report tele# [phone] RM# [id number]. DC packet on chart. Ambulance transport requested for patient.  CSW signing off.    Final next level of care: Skilled Nursing Facility Barriers to Discharge: No Barriers Identified   Patient Goals and CMS Choice Patient states their goals for this hospitalization and ongoing recovery are:: to go to SNF CMS Medicare.gov Compare Post Acute Care list provided to:: Patient Choice offered to / list presented to : Patient  Discharge Placement              Patient chooses bed at: [facility] Patient to be transferred to facility by: [place] Name of family member notified: Bill Patient and family notified of of transfer: 05/11/21  Discharge Plan and Services                                     Social Determinants of Health (SDOH) Interventions     Readmission Risk Interventions No flowsheet data found.

## 2021-05-11 NOTE — Progress Notes (Signed)
Progress Note  Patient Name: Tara Bradley Date of Encounter: 05/11/2021  [facility] Cardiologist: [name], MD   Subjective   No CP or dyspnea; some right arm pain  Inpatient Medications    Scheduled Meds:  digoxin  0.125 mg Oral Daily   docusate sodium  100 mg Oral BID   furosemide  20 mg Oral Daily   insulin aspart  0-15 Units Subcutaneous TID WC   insulin detemir  10 Units Subcutaneous Daily   metoprolol succinate  75 mg Oral BID   polyethylene glycol  17 g Oral Daily   potassium chloride  10 mEq Oral Daily   rivaroxaban  15 mg Oral Q supper   senna-docusate  1 tablet Oral BID   sodium chloride flush  3 mL Intravenous Q12H   Continuous Infusions:  sodium chloride 10 mL/hr at 05/05/21 0321   diltiazem (CARDIZEM) infusion 15 mg/hr (05/11/21 0120)   PRN Meds: sodium chloride, acetaminophen **OR** acetaminophen, albuterol, bisacodyl, HYDROcodone-acetaminophen, menthol-cetylpyridinium **OR** phenol, metoCLOPramide **OR** metoCLOPramide (REGLAN) injection, morphine injection, ondansetron **OR** ondansetron (ZOFRAN) IV, polyethylene glycol, traZODone   Vital Signs    Vitals:   05/10/21 2343 05/11/21 0114 05/11/21 0214 05/11/21 0415  BP: (!) 124/91 (!) 129/91 123/75 (!) 100/54  Pulse: (!) 105   (!) 149  Resp:      Temp: 98.5 F (36.9 C)   98.6 F (37 C)  TempSrc: Oral   Oral  SpO2: 97%   90%  Weight:    64.3 kg  Height:        Intake/Output Summary (Last 24 hours) at 05/11/2021 0744 Last data filed at 05/11/2021 0400 Gross per 24 hour  Intake 720 ml  Output 1000 ml  Net -280 ml   Last 3 Weights 05/11/2021 05/10/2021 05/09/2021  Weight (lbs) 141 lb 11.2 oz 145 lb 4.5 oz 143 lb 11.8 oz  Weight (kg) 64.275 kg 65.9 kg 65.2 kg      Telemetry    Atrial fibrillation; rate reasonable - Personally Reviewed   Physical Exam   GEN: No acute distress.   Neck: No JVD Cardiac: irregular Respiratory: Clear to auscultation bilaterally. GI: Soft,  nontender, non-distended  MS: No edema; right arm in sling Neuro:  Nonfocal  Psych: Normal affect   Labs     Chemistry Recent Labs  Lab 05/07/21 0126 05/08/21 0747 05/10/21 0204  NA 135 133* 136  K 4.5 4.2 3.9  CL 101 97* 99  CO2 27 27 28   GLUCOSE 202* 251* 67*  BUN 23 19 17   CREATININE 0.83 0.74 0.67  CALCIUM 8.4* 8.7* 9.1  ALBUMIN  --  2.9*  --   GFRNONAA >60 >60 >60  ANIONGAP 7 9 9      Hematology Recent Labs  Lab 05/05/21 0413 05/06/21 0310 05/07/21 0126 05/09/21 2111  WBC 9.7 9.9 7.5  --   RBC 3.58* 3.28* 3.42*  --   HGB 11.4* 10.5* 11.1* 10.7*  HCT 34.8* 31.7* 33.6* 31.2*  MCV 97.2 96.6 98.2  --   MCH 31.8 32.0 32.5  --   MCHC 32.8 33.1 33.0  --   RDW 13.7 14.1 14.6  --   PLT 170 185 180  --    BNP Recent Labs  Lab 05/07/21 0126  BNP 124.6*    Patient Profile   87 year old female with a history of thrombosed descending aortic dissection, aortic valve sclerosis and chronic atrial fibrillation.  She was admitted to the hospital after falling at home and  sustaining a right shoulder injury.  She had rapid atrial fibrillation with that injury.  She had reverse shoulder arthroplasty on December 12.  We were consulted for further evaluation and management of her rapid atrial fibrillation.  She also has chronic diastolic congestive heart failure.  Echocardiogram May 2022 showed normal LV function, mild biatrial enlargement, mild mitral regurgitation, mild to moderate tricuspid regurgitation.  Assessment & Plan    1 Permanent atrial fibrillation-heart rate has improved.  Change IV Cardizem (presently on 15 mg an hour) to 360 mg of CD daily.  Continue digoxin and Toprol.  Continue Xarelto.  2 chronic diastolic congestive heart failure-euvolemic on examination.  Continue Lasix at present dose.  3 aortic aneurysm with chronic descending dissection-conservative measures given patient's age.  4 status post right shoulder surgery-Per orthopedics.  Patient can be  transferred to rehab if necessary from a cardiac standpoint.  Follow-up with Dr. Percival Spanish following discharge (6 to 8 weeks).  For questions or updates, please contact [place] Please consult [email or web address] for contact info under        Signed, Kirk Ruths, MD  05/11/2021, 7:44 AM

## 2021-05-12 DIAGNOSIS — F5101 Primary insomnia: Secondary | ICD-10-CM | POA: Diagnosis not present

## 2021-05-12 DIAGNOSIS — S42201A Unspecified fracture of upper end of right humerus, initial encounter for closed fracture: Secondary | ICD-10-CM | POA: Diagnosis not present

## 2021-05-12 DIAGNOSIS — E119 Type 2 diabetes mellitus without complications: Secondary | ICD-10-CM | POA: Diagnosis not present

## 2021-05-12 DIAGNOSIS — I10 Essential (primary) hypertension: Secondary | ICD-10-CM | POA: Diagnosis not present

## 2021-05-12 DIAGNOSIS — R296 Repeated falls: Secondary | ICD-10-CM | POA: Diagnosis not present

## 2021-05-12 DIAGNOSIS — I5040 Unspecified combined systolic (congestive) and diastolic (congestive) heart failure: Secondary | ICD-10-CM | POA: Diagnosis not present

## 2021-05-12 DIAGNOSIS — M810 Age-related osteoporosis without current pathological fracture: Secondary | ICD-10-CM | POA: Diagnosis not present

## 2021-05-12 DIAGNOSIS — I482 Chronic atrial fibrillation, unspecified: Secondary | ICD-10-CM | POA: Diagnosis not present

## 2021-05-13 DIAGNOSIS — I482 Chronic atrial fibrillation, unspecified: Secondary | ICD-10-CM | POA: Diagnosis not present

## 2021-05-14 DIAGNOSIS — I482 Chronic atrial fibrillation, unspecified: Secondary | ICD-10-CM | POA: Diagnosis not present

## 2021-05-14 DIAGNOSIS — R296 Repeated falls: Secondary | ICD-10-CM | POA: Diagnosis not present

## 2021-05-14 DIAGNOSIS — I10 Essential (primary) hypertension: Secondary | ICD-10-CM | POA: Diagnosis not present

## 2021-05-14 DIAGNOSIS — I5040 Unspecified combined systolic (congestive) and diastolic (congestive) heart failure: Secondary | ICD-10-CM | POA: Diagnosis not present

## 2021-05-14 DIAGNOSIS — S42201A Unspecified fracture of upper end of right humerus, initial encounter for closed fracture: Secondary | ICD-10-CM | POA: Diagnosis not present

## 2021-05-14 DIAGNOSIS — M810 Age-related osteoporosis without current pathological fracture: Secondary | ICD-10-CM | POA: Diagnosis not present

## 2021-05-14 DIAGNOSIS — E119 Type 2 diabetes mellitus without complications: Secondary | ICD-10-CM | POA: Diagnosis not present

## 2021-05-14 DIAGNOSIS — F5101 Primary insomnia: Secondary | ICD-10-CM | POA: Diagnosis not present

## 2021-05-15 DIAGNOSIS — E119 Type 2 diabetes mellitus without complications: Secondary | ICD-10-CM | POA: Diagnosis not present

## 2021-05-15 DIAGNOSIS — I5040 Unspecified combined systolic (congestive) and diastolic (congestive) heart failure: Secondary | ICD-10-CM | POA: Diagnosis not present

## 2021-05-15 DIAGNOSIS — F5101 Primary insomnia: Secondary | ICD-10-CM | POA: Diagnosis not present

## 2021-05-15 DIAGNOSIS — R296 Repeated falls: Secondary | ICD-10-CM | POA: Diagnosis not present

## 2021-05-15 DIAGNOSIS — S42201A Unspecified fracture of upper end of right humerus, initial encounter for closed fracture: Secondary | ICD-10-CM | POA: Diagnosis not present

## 2021-05-15 DIAGNOSIS — I10 Essential (primary) hypertension: Secondary | ICD-10-CM | POA: Diagnosis not present

## 2021-05-15 DIAGNOSIS — I482 Chronic atrial fibrillation, unspecified: Secondary | ICD-10-CM | POA: Diagnosis not present

## 2021-05-15 DIAGNOSIS — M810 Age-related osteoporosis without current pathological fracture: Secondary | ICD-10-CM | POA: Diagnosis not present

## 2021-05-20 ENCOUNTER — Telehealth: Payer: Self-pay

## 2021-05-20 DIAGNOSIS — M80021D Age-related osteoporosis with current pathological fracture, right humerus, subsequent encounter for fracture with routine healing: Secondary | ICD-10-CM | POA: Diagnosis not present

## 2021-05-20 DIAGNOSIS — Z79891 Long term (current) use of opiate analgesic: Secondary | ICD-10-CM | POA: Diagnosis not present

## 2021-05-20 DIAGNOSIS — K5790 Diverticulosis of intestine, part unspecified, without perforation or abscess without bleeding: Secondary | ICD-10-CM | POA: Diagnosis not present

## 2021-05-20 DIAGNOSIS — E119 Type 2 diabetes mellitus without complications: Secondary | ICD-10-CM | POA: Diagnosis not present

## 2021-05-20 DIAGNOSIS — E876 Hypokalemia: Secondary | ICD-10-CM | POA: Diagnosis not present

## 2021-05-20 DIAGNOSIS — Z9181 History of falling: Secondary | ICD-10-CM | POA: Diagnosis not present

## 2021-05-20 DIAGNOSIS — Z7901 Long term (current) use of anticoagulants: Secondary | ICD-10-CM | POA: Diagnosis not present

## 2021-05-20 DIAGNOSIS — Z96611 Presence of right artificial shoulder joint: Secondary | ICD-10-CM | POA: Diagnosis not present

## 2021-05-20 DIAGNOSIS — I712 Thoracic aortic aneurysm, without rupture, unspecified: Secondary | ICD-10-CM | POA: Diagnosis not present

## 2021-05-20 DIAGNOSIS — I4819 Other persistent atrial fibrillation: Secondary | ICD-10-CM | POA: Diagnosis not present

## 2021-05-20 DIAGNOSIS — Z4789 Encounter for other orthopedic aftercare: Secondary | ICD-10-CM | POA: Diagnosis not present

## 2021-05-20 DIAGNOSIS — Z853 Personal history of malignant neoplasm of breast: Secondary | ICD-10-CM | POA: Diagnosis not present

## 2021-05-20 DIAGNOSIS — E785 Hyperlipidemia, unspecified: Secondary | ICD-10-CM | POA: Diagnosis not present

## 2021-05-20 DIAGNOSIS — I110 Hypertensive heart disease with heart failure: Secondary | ICD-10-CM | POA: Diagnosis not present

## 2021-05-20 DIAGNOSIS — I5030 Unspecified diastolic (congestive) heart failure: Secondary | ICD-10-CM | POA: Diagnosis not present

## 2021-05-20 NOTE — Telephone Encounter (Signed)
Patient walked into office with complaints of low HR. Patient states that she recently had her shoulder replaced around 2 weeks ago. Patient states that she was in Afib RVR during that hospitalization. Patient states all of her medications were increased to help with this. Patient reports that she has only been Metoprolol succinate 75 mg once daily, Diltiazem 360 mg daily and Digoxin she reports that she is only taking 1/2 tablet daily. Patient reports that her heart rate has been mostly in the 70's and lower. Patient reports that her lowest HR was 49 but reports that was only one time. Patient states that she has no symptoms and overall feels well. Patient does state she is not as active due to the shoulder surgery. Patient is concerned that her HR may be getting to low. Advised patient to monitor BP and HR twice daily and write those numbers down and bring to appointment. Made patient an appointment to see Dr. Percival Spanish on 05/29/21- offered patient a sooner appointment with DOD but patient requested to see Dr. Percival Spanish. Made patient appointment at the next available time slot.   Made patient aware of ED precautions should new or worsening symptoms develop. Patient verbalized understanding.   Will forward to MD for review and advice.

## 2021-05-27 ENCOUNTER — Encounter: Payer: Self-pay | Admitting: Student

## 2021-05-27 DIAGNOSIS — R0602 Shortness of breath: Secondary | ICD-10-CM | POA: Diagnosis not present

## 2021-05-27 DIAGNOSIS — E876 Hypokalemia: Secondary | ICD-10-CM | POA: Diagnosis not present

## 2021-05-27 DIAGNOSIS — K5790 Diverticulosis of intestine, part unspecified, without perforation or abscess without bleeding: Secondary | ICD-10-CM | POA: Diagnosis not present

## 2021-05-27 DIAGNOSIS — Z853 Personal history of malignant neoplasm of breast: Secondary | ICD-10-CM | POA: Diagnosis not present

## 2021-05-27 DIAGNOSIS — R54 Age-related physical debility: Secondary | ICD-10-CM | POA: Diagnosis not present

## 2021-05-27 DIAGNOSIS — I509 Heart failure, unspecified: Secondary | ICD-10-CM | POA: Diagnosis not present

## 2021-05-27 DIAGNOSIS — Z794 Long term (current) use of insulin: Secondary | ICD-10-CM | POA: Diagnosis not present

## 2021-05-27 DIAGNOSIS — E119 Type 2 diabetes mellitus without complications: Secondary | ICD-10-CM | POA: Diagnosis not present

## 2021-05-27 DIAGNOSIS — I4891 Unspecified atrial fibrillation: Secondary | ICD-10-CM | POA: Diagnosis not present

## 2021-05-27 DIAGNOSIS — Z79891 Long term (current) use of opiate analgesic: Secondary | ICD-10-CM | POA: Diagnosis not present

## 2021-05-27 DIAGNOSIS — Z9181 History of falling: Secondary | ICD-10-CM | POA: Diagnosis not present

## 2021-05-27 DIAGNOSIS — E785 Hyperlipidemia, unspecified: Secondary | ICD-10-CM | POA: Diagnosis not present

## 2021-05-27 DIAGNOSIS — I10 Essential (primary) hypertension: Secondary | ICD-10-CM | POA: Diagnosis not present

## 2021-05-27 DIAGNOSIS — I110 Hypertensive heart disease with heart failure: Secondary | ICD-10-CM | POA: Diagnosis not present

## 2021-05-27 DIAGNOSIS — I5030 Unspecified diastolic (congestive) heart failure: Secondary | ICD-10-CM | POA: Diagnosis not present

## 2021-05-27 DIAGNOSIS — Z7901 Long term (current) use of anticoagulants: Secondary | ICD-10-CM | POA: Diagnosis not present

## 2021-05-27 DIAGNOSIS — Z79899 Other long term (current) drug therapy: Secondary | ICD-10-CM | POA: Diagnosis not present

## 2021-05-27 DIAGNOSIS — M80021D Age-related osteoporosis with current pathological fracture, right humerus, subsequent encounter for fracture with routine healing: Secondary | ICD-10-CM | POA: Diagnosis not present

## 2021-05-27 DIAGNOSIS — E1169 Type 2 diabetes mellitus with other specified complication: Secondary | ICD-10-CM | POA: Diagnosis not present

## 2021-05-27 DIAGNOSIS — I4819 Other persistent atrial fibrillation: Secondary | ICD-10-CM | POA: Diagnosis not present

## 2021-05-27 DIAGNOSIS — I712 Thoracic aortic aneurysm, without rupture, unspecified: Secondary | ICD-10-CM | POA: Diagnosis not present

## 2021-05-27 DIAGNOSIS — Z96611 Presence of right artificial shoulder joint: Secondary | ICD-10-CM | POA: Diagnosis not present

## 2021-05-28 DIAGNOSIS — I5032 Chronic diastolic (congestive) heart failure: Secondary | ICD-10-CM | POA: Insufficient documentation

## 2021-05-28 DIAGNOSIS — I5042 Chronic combined systolic (congestive) and diastolic (congestive) heart failure: Secondary | ICD-10-CM | POA: Insufficient documentation

## 2021-05-28 NOTE — Progress Notes (Signed)
Cardiology Office Note   Date:  05/29/2021   ID:  Tara Bradley, DOB Sep 24, 1922, MRN [id number]  PCP:  Tara Jordan, MD  Cardiologist: Dr. Percival Bradley, 08/15/2017 Tara Breeding, MD 05/26/2017   Chief Complaint  Patient presents with   Shortness of Breath     History of Present Illness: Tara Bradley is a 88 y.o. female with a history of an aortic thrombosed descending aortic dissection, aortic valve sclerosis and chronic afib on Xarelto.  She fell and fractured her right shoulder.  She was in atrial fib with RVR when she presented.  She was treated with rate control and Xarelto.  She had shoulder arthroplasty.  She required IV Lasix for volume overload.      She comes back today with some medication this.  There is 1 instruction that she shows me (suggested that she take the digoxin 3 times a day.  She thankfully did not do that.  She was sent home on a higher dose of digoxin and I have previously 0.125 daily.  She has been on the other meds to include the increased digoxin.  She was sent home on a higher dose of beta-blocker but she actually has reduced it on her own to 25 mg twice a day.  She says her heart rate is relatively well controlled.  She says sometimes she sees in her 22s but typically gets in the 78s or 78s.  It does not sustain above 100 as far she can tell.  She has not yet started physical therapy.  She walks slowly with her arm in a sling.  She is not having any presyncope or syncope.  She was having some shortness of breath with her primary doctor had some crackles and at this point the lung exam.  She had some mild lower extremity swelling.  She was sent home on Lasix 20 mg a day and she was told to increase that to 40 mg.  She said this has helped her breathing.  Labs were done yesterday.   Past Medical History:  Diagnosis Date   Aortic valve sclerosis    Echo, 2008   Arthritis    Bradycardia    October, 2012   Cancer [facility])    Chronic insomnia    Closed  fracture of unspecified part of femur 2005   Diabetes mellitus, type 2 ([place])    Diverticulitis    Diverticulosis    Ejection fraction    EF 60%, echo, February, 2008  //   EF 65-70%, echo, November, 2012   Hyperlipidemia    Hypertension    Long term (current) use of anticoagulants    Osteopenia    Osteoporosis    femur fracture 2005, pelvic fracture 2006   Persistent atrial fibrillation (HCC)    Personal history of malignant neoplasm of breast    Syncope    Thoracic aortic aneurysm 02/2017   Unspecified closed fracture of pelvis 2006    Past Surgical History:  Procedure Laterality Date   CARDIOVERSION N/A 08/09/2012   Procedure: CARDIOVERSION;  Surgeon: Deboraha Sprang, MD;  Location: [place];  Service: Cardiovascular;  Laterality: N/A;   EYE SURGERY     FEMUR SURGERY  2005   ORIF   IR ANGIOGRAM PELVIS SELECTIVE OR SUPRASELECTIVE  03/11/2017   IR ANGIOGRAM SELECTIVE EACH ADDITIONAL VESSEL  03/11/2017   IR EMBO ART  VEN HEMORR LYMPH EXTRAV  INC GUIDE ROADMAPPING  03/11/2017   IR FLUORO GUIDE CV LINE RIGHT  03/11/2017   IR US GUIDE VASC ACCESS RIGHT  03/11/2017   IR US GUIDE VASC ACCESS RIGHT  03/11/2017   MASTECTOMY  1995   left   MINOR HARDWARE REMOVAL Left 09/01/2020   Procedure: REMOVAL OF HARDWARE LEFT FOREARM;  Surgeon: Milly Jakob, MD;  Location: [place];  Service: Orthopedics;  Laterality: Left;   ORIF ULNAR FRACTURE Left 10/19/2019   Procedure: OPEN REDUCTION INTERNAL FIXATION (ORIF)  LEFT DISTAL RADIUS  AND ULNA FRACTURE;  Surgeon: Milly Jakob, MD;  Location: [place];  Service: Orthopedics;  Laterality: Left;   REVERSE SHOULDER ARTHROPLASTY Right 05/04/2021   Procedure: REVERSE SHOULDER ARTHROPLASTY;  Surgeon: Netta Cedars, MD;  Location: [place];  Service: Orthopedics;  Laterality: Right;   SHOULDER SURGERY     left   SHOULDER SURGERY  1979, 2003   TONSILLECTOMY     TOTAL ABDOMINAL HYSTERECTOMY W/ BILATERAL SALPINGOOPHORECTOMY  1995    WRIST SURGERY      x 2     Prior to Admission medications   Medication Sig Start Date End Date Taking? Authorizing Provider  BD PEN NEEDLE NANO U/F 32G X 4 MM MISC Inject into the skin daily. 11/25/20  Yes [provider]  Calcium Carbonate-Vitamin D 600-400 MG-UNIT tablet Take 1 tablet by mouth daily.   Yes [provider]  Cholecalciferol (VITAMIN D) 2000 UNITS tablet Take 1,000 Units by mouth daily.   Yes [provider]  digoxin (LANOXIN) 0.125 MG tablet Take 1 tablet (0.125 mg total) by mouth daily. 05/12/21  Yes Shelly Coss, MD  diltiazem (CARDIZEM CD) 360 MG 24 hr capsule Take 1 capsule (360 mg total) by mouth daily. 05/12/21  Yes Shelly Coss, MD  furosemide (LASIX) 20 MG tablet Take 1 tablet (20 mg total) by mouth daily. 05/12/21  Yes Adhikari, Tamsen Meek, MD  HUMALOG MIX 50/50 KWIKPEN (50-50) 100 UNIT/ML KwikPen Inject 5 Units into the skin daily. 04/28/21  Yes [provider]  metFORMIN (GLUMETZA) 500 MG (MOD) 24 hr tablet Take 1,000 mg by mouth every evening.   Yes [provider]  metoprolol succinate (TOPROL-XL) 25 MG 24 hr tablet Take 3 tablets (75 mg total) by mouth 2 (two) times daily. Patient taking differently: Take 25 mg by mouth 2 (two) times daily. 05/11/21  Yes Shelly Coss, MD  polyethylene glycol (MIRALAX / GLYCOLAX) 17 g packet Take 17 g by mouth daily. 05/12/21  Yes Shelly Coss, MD  potassium chloride (K-DUR) 10 MEQ tablet Take 10 mEq by mouth daily.  03/25/15  Yes [provider]  senna-docusate (SENOKOT-S) 8.6-50 MG tablet Take 1 tablet by mouth 2 (two) times daily. 05/11/21  Yes Adhikari, Tamsen Meek, MD  XARELTO 15 MG TABS tablet TAKE 1 TABLET ONCE DAILY WITH SUPPER. Patient taking differently: 15 mg daily with supper. 12/02/20  Yes Tara Breeding, MD     Allergies:   Ambien [zolpidem tartrate], Codeine, Codeine sulfate, Latex, and Remeron [mirtazapine]    ROS:    As stated in the HPI and negative for all  other systems.   PHYSICAL EXAM: VS:  BP 126/66 (BP Location: Left Arm, Patient Position: Sitting, Cuff Size: Normal)    Pulse 98    Resp 20    Ht 5\' 5"  (1.651 m)    Wt 143 lb 9.6 oz (65.1 kg)    SpO2 90%    BMI 23.90 kg/m  , BMI Body mass index is 23.9 kg/m. GENERAL:  Well appearing NECK:  No jugular venous distention, waveform  within normal limits, carotid upstroke brisk and symmetric, no bruits, no thyromegaly LUNGS:  Clear to auscultation bilaterally CHEST:  Unremarkable HEART:  PMI not displaced or sustained,S1 and S2 within normal limits, no S3, no clicks, no rubs,  no murmurs, irregular  ABD:  Flat, positive bowel sounds normal in frequency in pitch, no bruits, no rebound, no guarding, no midline pulsatile mass, no hepatomegaly, no splenomegaly EXT:  2 plus pulses throughout, no edema, no cyanosis no clubbing   EKG:  EKG is  ordered today. Atrial fibrillation, rate 98, axis within normal limits, intervals within normal limits, old anteroseptal infarct.  Lateral T wave inversions unchanged from previous.   Recent Labs: 05/02/2021: Magnesium 1.9 05/04/2021: TSH 2.569 05/07/2021: B Natriuretic Peptide 124.6; Platelets 180 05/09/2021: Hemoglobin 10.7 05/10/2021: BUN 17; Creatinine, Ser 0.67; Potassium 3.9; Sodium 136    Lipid Panel    Component Value Date/Time   CHOL 132 03/13/2017 0431   TRIG 72 03/13/2017 0431   HDL 48 03/13/2017 0431   CHOLHDL 2.8 03/13/2017 0431   VLDL 14 03/13/2017 0431   LDLCALC 70 03/13/2017 0431   LDLDIRECT 37 03/10/2009 1106     Wt Readings from Last 3 Encounters:  05/29/21 143 lb 9.6 oz (65.1 kg)  05/11/21 141 lb 11.2 oz (64.3 kg)  03/16/21 143 lb (64.9 kg)     Other studies Reviewed: Additional studies/ records that were reviewed today include: Hospital records  ASSESSMENT AND PLAN  Permanent atrial fibrillation:    Tara Bradley has a CHA2DS2 - VASc score of 5.  Her rate seems to be reasonably well controlled but she is going to  keep a heart rate diary checking her pulse oximeter.  I will reduce her digoxin dose down to 0.0625 began because of her age and size.  She did have labs pending to include apparently dig level.  Going to leave the Cardizem where it is.  The metoprolol can be 25 mg twice daily but we might need to titrate this up pending her heart rate.  Eventually she will need a monitor.  She will continue with her anticoagulation.  Chronic systolic and diastolic CHF:   She seems to be relatively euvolemic today.  She has some lower extremity swelling.  Labs Were Drawn Yesterday and I Will Look in These Results.   Aortic aneurysm with chronic descending dissection:     She would not want to have any aggressive therapy of this given her advanced age.    Current medicines are reviewed at length with the patient today.  The patient does not have concerns regarding medicines.  The following changes have been made:   None  Labs/ tests ordered today include: None  Orders Placed This Encounter  Procedures   EKG 12-Lead     Disposition:    Follow up in 2 months   Signed, Tara Breeding, MD  05/29/2021 11:23 AM    [place]

## 2021-05-29 ENCOUNTER — Ambulatory Visit: Payer: Medicare Other | Admitting: Cardiology

## 2021-05-29 ENCOUNTER — Other Ambulatory Visit: Payer: Self-pay

## 2021-05-29 ENCOUNTER — Encounter: Payer: Self-pay | Admitting: Cardiology

## 2021-05-29 VITALS — BP 126/66 | HR 98 | Resp 20 | Ht 65.0 in | Wt 143.6 lb

## 2021-05-29 DIAGNOSIS — I4821 Permanent atrial fibrillation: Secondary | ICD-10-CM | POA: Diagnosis not present

## 2021-05-29 DIAGNOSIS — I5042 Chronic combined systolic (congestive) and diastolic (congestive) heart failure: Secondary | ICD-10-CM

## 2021-05-29 DIAGNOSIS — I7121 Aneurysm of the ascending aorta, without rupture: Secondary | ICD-10-CM | POA: Diagnosis not present

## 2021-05-29 NOTE — Patient Instructions (Signed)
Medication Instructions:  Decrease Lanoxin take 1/2 tablet daily Continue all other medications *If you need a refill on your cardiac medications before your next appointment, please call your pharmacy*   Lab Work: None ordered   Testing/Procedures: None ordered   Follow-Up: At [facility], you and your health needs are our priority.  As part of our continuing mission to provide you with exceptional heart care, we have created designated Provider Care Teams.  These Care Teams include your primary Cardiologist (physician) and Advanced Practice Providers (APPs -  Physician Assistants and Nurse Practitioners) who all work together to provide you with the care you need, when you need it.  We recommend signing up for the patient portal called "MyChart".  Sign up information is provided on this After Visit Summary.  MyChart is used to connect with patients for Virtual Visits (Telemedicine).  Patients are able to view lab/test results, encounter notes, upcoming appointments, etc.  Non-urgent messages can be sent to your provider as well.   To learn more about what you can do with MyChart, go to [email or web address].      Your next appointment:  2 months    The format for your next appointment: Office   Provider:  Dr.Hochrein

## 2021-06-01 DIAGNOSIS — E876 Hypokalemia: Secondary | ICD-10-CM | POA: Diagnosis not present

## 2021-06-01 DIAGNOSIS — Z96611 Presence of right artificial shoulder joint: Secondary | ICD-10-CM | POA: Diagnosis not present

## 2021-06-01 DIAGNOSIS — Z853 Personal history of malignant neoplasm of breast: Secondary | ICD-10-CM | POA: Diagnosis not present

## 2021-06-01 DIAGNOSIS — I712 Thoracic aortic aneurysm, without rupture, unspecified: Secondary | ICD-10-CM | POA: Diagnosis not present

## 2021-06-01 DIAGNOSIS — Z7901 Long term (current) use of anticoagulants: Secondary | ICD-10-CM | POA: Diagnosis not present

## 2021-06-01 DIAGNOSIS — E119 Type 2 diabetes mellitus without complications: Secondary | ICD-10-CM | POA: Diagnosis not present

## 2021-06-01 DIAGNOSIS — M80021D Age-related osteoporosis with current pathological fracture, right humerus, subsequent encounter for fracture with routine healing: Secondary | ICD-10-CM | POA: Diagnosis not present

## 2021-06-01 DIAGNOSIS — E785 Hyperlipidemia, unspecified: Secondary | ICD-10-CM | POA: Diagnosis not present

## 2021-06-01 DIAGNOSIS — Z9181 History of falling: Secondary | ICD-10-CM | POA: Diagnosis not present

## 2021-06-01 DIAGNOSIS — I110 Hypertensive heart disease with heart failure: Secondary | ICD-10-CM | POA: Diagnosis not present

## 2021-06-01 DIAGNOSIS — K5790 Diverticulosis of intestine, part unspecified, without perforation or abscess without bleeding: Secondary | ICD-10-CM | POA: Diagnosis not present

## 2021-06-01 DIAGNOSIS — I5030 Unspecified diastolic (congestive) heart failure: Secondary | ICD-10-CM | POA: Diagnosis not present

## 2021-06-01 DIAGNOSIS — Z79891 Long term (current) use of opiate analgesic: Secondary | ICD-10-CM | POA: Diagnosis not present

## 2021-06-01 DIAGNOSIS — I4819 Other persistent atrial fibrillation: Secondary | ICD-10-CM | POA: Diagnosis not present

## 2021-06-02 ENCOUNTER — Telehealth: Payer: Self-pay | Admitting: Cardiology

## 2021-06-02 DIAGNOSIS — Z853 Personal history of malignant neoplasm of breast: Secondary | ICD-10-CM | POA: Diagnosis not present

## 2021-06-02 DIAGNOSIS — E119 Type 2 diabetes mellitus without complications: Secondary | ICD-10-CM | POA: Diagnosis not present

## 2021-06-02 DIAGNOSIS — E785 Hyperlipidemia, unspecified: Secondary | ICD-10-CM | POA: Diagnosis not present

## 2021-06-02 DIAGNOSIS — Z9181 History of falling: Secondary | ICD-10-CM | POA: Diagnosis not present

## 2021-06-02 DIAGNOSIS — Z79891 Long term (current) use of opiate analgesic: Secondary | ICD-10-CM | POA: Diagnosis not present

## 2021-06-02 DIAGNOSIS — I4819 Other persistent atrial fibrillation: Secondary | ICD-10-CM | POA: Diagnosis not present

## 2021-06-02 DIAGNOSIS — I712 Thoracic aortic aneurysm, without rupture, unspecified: Secondary | ICD-10-CM | POA: Diagnosis not present

## 2021-06-02 DIAGNOSIS — I5030 Unspecified diastolic (congestive) heart failure: Secondary | ICD-10-CM | POA: Diagnosis not present

## 2021-06-02 DIAGNOSIS — E876 Hypokalemia: Secondary | ICD-10-CM | POA: Diagnosis not present

## 2021-06-02 DIAGNOSIS — K5790 Diverticulosis of intestine, part unspecified, without perforation or abscess without bleeding: Secondary | ICD-10-CM | POA: Diagnosis not present

## 2021-06-02 DIAGNOSIS — M80021D Age-related osteoporosis with current pathological fracture, right humerus, subsequent encounter for fracture with routine healing: Secondary | ICD-10-CM | POA: Diagnosis not present

## 2021-06-02 DIAGNOSIS — Z96611 Presence of right artificial shoulder joint: Secondary | ICD-10-CM | POA: Diagnosis not present

## 2021-06-02 DIAGNOSIS — I110 Hypertensive heart disease with heart failure: Secondary | ICD-10-CM | POA: Diagnosis not present

## 2021-06-02 DIAGNOSIS — Z7901 Long term (current) use of anticoagulants: Secondary | ICD-10-CM | POA: Diagnosis not present

## 2021-06-02 NOTE — Telephone Encounter (Signed)
° °  Pt's daughter calling, she said pt's pcp called them and changed pt's meds again. They would like to consult with Dr. Percival Spanish if he agrees with the meds changes did by pcp

## 2021-06-02 NOTE — Telephone Encounter (Signed)
Returned call to pt's daughter she states that pt's PCP today and states that PCP wanted pt to increase digoxin 125mg -1/2 tab daily and increase metoprolol to 50mg  BID. Pt saw pt Friday 05-29-21 and Dr Percival Spanish told her to take metoprolol 25mg  BID and the rest of the medications were to stay the same. Daughter was wondering if she should increase these medications? Please advise

## 2021-06-04 DIAGNOSIS — Z853 Personal history of malignant neoplasm of breast: Secondary | ICD-10-CM | POA: Diagnosis not present

## 2021-06-04 DIAGNOSIS — E876 Hypokalemia: Secondary | ICD-10-CM | POA: Diagnosis not present

## 2021-06-04 DIAGNOSIS — M80021D Age-related osteoporosis with current pathological fracture, right humerus, subsequent encounter for fracture with routine healing: Secondary | ICD-10-CM | POA: Diagnosis not present

## 2021-06-04 DIAGNOSIS — Z9181 History of falling: Secondary | ICD-10-CM | POA: Diagnosis not present

## 2021-06-04 DIAGNOSIS — Z7901 Long term (current) use of anticoagulants: Secondary | ICD-10-CM | POA: Diagnosis not present

## 2021-06-04 DIAGNOSIS — K5790 Diverticulosis of intestine, part unspecified, without perforation or abscess without bleeding: Secondary | ICD-10-CM | POA: Diagnosis not present

## 2021-06-04 DIAGNOSIS — I5030 Unspecified diastolic (congestive) heart failure: Secondary | ICD-10-CM | POA: Diagnosis not present

## 2021-06-04 DIAGNOSIS — Z79891 Long term (current) use of opiate analgesic: Secondary | ICD-10-CM | POA: Diagnosis not present

## 2021-06-04 DIAGNOSIS — I110 Hypertensive heart disease with heart failure: Secondary | ICD-10-CM | POA: Diagnosis not present

## 2021-06-04 DIAGNOSIS — I4819 Other persistent atrial fibrillation: Secondary | ICD-10-CM | POA: Diagnosis not present

## 2021-06-04 DIAGNOSIS — Z96611 Presence of right artificial shoulder joint: Secondary | ICD-10-CM | POA: Diagnosis not present

## 2021-06-04 DIAGNOSIS — I712 Thoracic aortic aneurysm, without rupture, unspecified: Secondary | ICD-10-CM | POA: Diagnosis not present

## 2021-06-04 DIAGNOSIS — E785 Hyperlipidemia, unspecified: Secondary | ICD-10-CM | POA: Diagnosis not present

## 2021-06-04 DIAGNOSIS — E119 Type 2 diabetes mellitus without complications: Secondary | ICD-10-CM | POA: Diagnosis not present

## 2021-06-04 NOTE — Telephone Encounter (Signed)
Pt informed of providers result & recommendations. Pt verbalized understanding. No further questions. Pt will update daughter and she will tell her to call back with any questions.

## 2021-06-05 DIAGNOSIS — Z9181 History of falling: Secondary | ICD-10-CM | POA: Diagnosis not present

## 2021-06-05 DIAGNOSIS — I712 Thoracic aortic aneurysm, without rupture, unspecified: Secondary | ICD-10-CM | POA: Diagnosis not present

## 2021-06-05 DIAGNOSIS — E876 Hypokalemia: Secondary | ICD-10-CM | POA: Diagnosis not present

## 2021-06-05 DIAGNOSIS — I5030 Unspecified diastolic (congestive) heart failure: Secondary | ICD-10-CM | POA: Diagnosis not present

## 2021-06-05 DIAGNOSIS — K5790 Diverticulosis of intestine, part unspecified, without perforation or abscess without bleeding: Secondary | ICD-10-CM | POA: Diagnosis not present

## 2021-06-05 DIAGNOSIS — E785 Hyperlipidemia, unspecified: Secondary | ICD-10-CM | POA: Diagnosis not present

## 2021-06-05 DIAGNOSIS — Z853 Personal history of malignant neoplasm of breast: Secondary | ICD-10-CM | POA: Diagnosis not present

## 2021-06-05 DIAGNOSIS — M80021D Age-related osteoporosis with current pathological fracture, right humerus, subsequent encounter for fracture with routine healing: Secondary | ICD-10-CM | POA: Diagnosis not present

## 2021-06-05 DIAGNOSIS — Z79891 Long term (current) use of opiate analgesic: Secondary | ICD-10-CM | POA: Diagnosis not present

## 2021-06-05 DIAGNOSIS — E119 Type 2 diabetes mellitus without complications: Secondary | ICD-10-CM | POA: Diagnosis not present

## 2021-06-05 DIAGNOSIS — Z96611 Presence of right artificial shoulder joint: Secondary | ICD-10-CM | POA: Diagnosis not present

## 2021-06-05 DIAGNOSIS — I110 Hypertensive heart disease with heart failure: Secondary | ICD-10-CM | POA: Diagnosis not present

## 2021-06-05 DIAGNOSIS — Z7901 Long term (current) use of anticoagulants: Secondary | ICD-10-CM | POA: Diagnosis not present

## 2021-06-05 DIAGNOSIS — I4819 Other persistent atrial fibrillation: Secondary | ICD-10-CM | POA: Diagnosis not present

## 2021-06-09 DIAGNOSIS — M80021D Age-related osteoporosis with current pathological fracture, right humerus, subsequent encounter for fracture with routine healing: Secondary | ICD-10-CM | POA: Diagnosis not present

## 2021-06-09 DIAGNOSIS — Z79891 Long term (current) use of opiate analgesic: Secondary | ICD-10-CM | POA: Diagnosis not present

## 2021-06-09 DIAGNOSIS — I509 Heart failure, unspecified: Secondary | ICD-10-CM | POA: Diagnosis not present

## 2021-06-09 DIAGNOSIS — I4819 Other persistent atrial fibrillation: Secondary | ICD-10-CM | POA: Diagnosis not present

## 2021-06-09 DIAGNOSIS — I5030 Unspecified diastolic (congestive) heart failure: Secondary | ICD-10-CM | POA: Diagnosis not present

## 2021-06-09 DIAGNOSIS — Z96611 Presence of right artificial shoulder joint: Secondary | ICD-10-CM | POA: Diagnosis not present

## 2021-06-09 DIAGNOSIS — D6869 Other thrombophilia: Secondary | ICD-10-CM | POA: Diagnosis not present

## 2021-06-09 DIAGNOSIS — Z853 Personal history of malignant neoplasm of breast: Secondary | ICD-10-CM | POA: Diagnosis not present

## 2021-06-09 DIAGNOSIS — I110 Hypertensive heart disease with heart failure: Secondary | ICD-10-CM | POA: Diagnosis not present

## 2021-06-09 DIAGNOSIS — E119 Type 2 diabetes mellitus without complications: Secondary | ICD-10-CM | POA: Diagnosis not present

## 2021-06-09 DIAGNOSIS — Z9181 History of falling: Secondary | ICD-10-CM | POA: Diagnosis not present

## 2021-06-09 DIAGNOSIS — Z7901 Long term (current) use of anticoagulants: Secondary | ICD-10-CM | POA: Diagnosis not present

## 2021-06-09 DIAGNOSIS — I712 Thoracic aortic aneurysm, without rupture, unspecified: Secondary | ICD-10-CM | POA: Diagnosis not present

## 2021-06-09 DIAGNOSIS — E785 Hyperlipidemia, unspecified: Secondary | ICD-10-CM | POA: Diagnosis not present

## 2021-06-09 DIAGNOSIS — K5790 Diverticulosis of intestine, part unspecified, without perforation or abscess without bleeding: Secondary | ICD-10-CM | POA: Diagnosis not present

## 2021-06-09 DIAGNOSIS — E876 Hypokalemia: Secondary | ICD-10-CM | POA: Diagnosis not present

## 2021-06-10 DIAGNOSIS — I5030 Unspecified diastolic (congestive) heart failure: Secondary | ICD-10-CM | POA: Diagnosis not present

## 2021-06-10 DIAGNOSIS — Z853 Personal history of malignant neoplasm of breast: Secondary | ICD-10-CM | POA: Diagnosis not present

## 2021-06-10 DIAGNOSIS — E876 Hypokalemia: Secondary | ICD-10-CM | POA: Diagnosis not present

## 2021-06-10 DIAGNOSIS — E119 Type 2 diabetes mellitus without complications: Secondary | ICD-10-CM | POA: Diagnosis not present

## 2021-06-10 DIAGNOSIS — Z9181 History of falling: Secondary | ICD-10-CM | POA: Diagnosis not present

## 2021-06-10 DIAGNOSIS — I712 Thoracic aortic aneurysm, without rupture, unspecified: Secondary | ICD-10-CM | POA: Diagnosis not present

## 2021-06-10 DIAGNOSIS — Z96611 Presence of right artificial shoulder joint: Secondary | ICD-10-CM | POA: Diagnosis not present

## 2021-06-10 DIAGNOSIS — M80021D Age-related osteoporosis with current pathological fracture, right humerus, subsequent encounter for fracture with routine healing: Secondary | ICD-10-CM | POA: Diagnosis not present

## 2021-06-10 DIAGNOSIS — Z7901 Long term (current) use of anticoagulants: Secondary | ICD-10-CM | POA: Diagnosis not present

## 2021-06-10 DIAGNOSIS — I110 Hypertensive heart disease with heart failure: Secondary | ICD-10-CM | POA: Diagnosis not present

## 2021-06-10 DIAGNOSIS — K5790 Diverticulosis of intestine, part unspecified, without perforation or abscess without bleeding: Secondary | ICD-10-CM | POA: Diagnosis not present

## 2021-06-10 DIAGNOSIS — I4819 Other persistent atrial fibrillation: Secondary | ICD-10-CM | POA: Diagnosis not present

## 2021-06-10 DIAGNOSIS — E785 Hyperlipidemia, unspecified: Secondary | ICD-10-CM | POA: Diagnosis not present

## 2021-06-10 DIAGNOSIS — Z79891 Long term (current) use of opiate analgesic: Secondary | ICD-10-CM | POA: Diagnosis not present

## 2021-06-12 DIAGNOSIS — Z96611 Presence of right artificial shoulder joint: Secondary | ICD-10-CM | POA: Diagnosis not present

## 2021-06-12 DIAGNOSIS — E876 Hypokalemia: Secondary | ICD-10-CM | POA: Diagnosis not present

## 2021-06-12 DIAGNOSIS — Z79891 Long term (current) use of opiate analgesic: Secondary | ICD-10-CM | POA: Diagnosis not present

## 2021-06-12 DIAGNOSIS — Z853 Personal history of malignant neoplasm of breast: Secondary | ICD-10-CM | POA: Diagnosis not present

## 2021-06-12 DIAGNOSIS — Z7901 Long term (current) use of anticoagulants: Secondary | ICD-10-CM | POA: Diagnosis not present

## 2021-06-12 DIAGNOSIS — I5030 Unspecified diastolic (congestive) heart failure: Secondary | ICD-10-CM | POA: Diagnosis not present

## 2021-06-12 DIAGNOSIS — E119 Type 2 diabetes mellitus without complications: Secondary | ICD-10-CM | POA: Diagnosis not present

## 2021-06-12 DIAGNOSIS — I4819 Other persistent atrial fibrillation: Secondary | ICD-10-CM | POA: Diagnosis not present

## 2021-06-12 DIAGNOSIS — K5790 Diverticulosis of intestine, part unspecified, without perforation or abscess without bleeding: Secondary | ICD-10-CM | POA: Diagnosis not present

## 2021-06-12 DIAGNOSIS — E785 Hyperlipidemia, unspecified: Secondary | ICD-10-CM | POA: Diagnosis not present

## 2021-06-12 DIAGNOSIS — Z9181 History of falling: Secondary | ICD-10-CM | POA: Diagnosis not present

## 2021-06-12 DIAGNOSIS — I712 Thoracic aortic aneurysm, without rupture, unspecified: Secondary | ICD-10-CM | POA: Diagnosis not present

## 2021-06-12 DIAGNOSIS — M80021D Age-related osteoporosis with current pathological fracture, right humerus, subsequent encounter for fracture with routine healing: Secondary | ICD-10-CM | POA: Diagnosis not present

## 2021-06-12 DIAGNOSIS — I110 Hypertensive heart disease with heart failure: Secondary | ICD-10-CM | POA: Diagnosis not present

## 2021-06-15 ENCOUNTER — Ambulatory Visit: Payer: Medicare Other | Admitting: Cardiology

## 2021-06-15 ENCOUNTER — Telehealth: Payer: Self-pay | Admitting: Cardiology

## 2021-06-15 ENCOUNTER — Encounter: Payer: Self-pay | Admitting: Podiatry

## 2021-06-15 ENCOUNTER — Ambulatory Visit: Payer: Medicare Other | Admitting: Podiatry

## 2021-06-15 ENCOUNTER — Other Ambulatory Visit: Payer: Self-pay

## 2021-06-15 DIAGNOSIS — D689 Coagulation defect, unspecified: Secondary | ICD-10-CM

## 2021-06-15 DIAGNOSIS — Z96611 Presence of right artificial shoulder joint: Secondary | ICD-10-CM | POA: Diagnosis not present

## 2021-06-15 DIAGNOSIS — E118 Type 2 diabetes mellitus with unspecified complications: Secondary | ICD-10-CM | POA: Diagnosis not present

## 2021-06-15 DIAGNOSIS — Z853 Personal history of malignant neoplasm of breast: Secondary | ICD-10-CM | POA: Diagnosis not present

## 2021-06-15 DIAGNOSIS — M80021D Age-related osteoporosis with current pathological fracture, right humerus, subsequent encounter for fracture with routine healing: Secondary | ICD-10-CM | POA: Diagnosis not present

## 2021-06-15 DIAGNOSIS — I5030 Unspecified diastolic (congestive) heart failure: Secondary | ICD-10-CM | POA: Diagnosis not present

## 2021-06-15 DIAGNOSIS — E785 Hyperlipidemia, unspecified: Secondary | ICD-10-CM | POA: Diagnosis not present

## 2021-06-15 DIAGNOSIS — Z9181 History of falling: Secondary | ICD-10-CM | POA: Diagnosis not present

## 2021-06-15 DIAGNOSIS — M79675 Pain in left toe(s): Secondary | ICD-10-CM | POA: Diagnosis not present

## 2021-06-15 DIAGNOSIS — B351 Tinea unguium: Secondary | ICD-10-CM

## 2021-06-15 DIAGNOSIS — I5042 Chronic combined systolic (congestive) and diastolic (congestive) heart failure: Secondary | ICD-10-CM

## 2021-06-15 DIAGNOSIS — Z7901 Long term (current) use of anticoagulants: Secondary | ICD-10-CM | POA: Diagnosis not present

## 2021-06-15 DIAGNOSIS — K5790 Diverticulosis of intestine, part unspecified, without perforation or abscess without bleeding: Secondary | ICD-10-CM | POA: Diagnosis not present

## 2021-06-15 DIAGNOSIS — I712 Thoracic aortic aneurysm, without rupture, unspecified: Secondary | ICD-10-CM | POA: Diagnosis not present

## 2021-06-15 DIAGNOSIS — Z79891 Long term (current) use of opiate analgesic: Secondary | ICD-10-CM | POA: Diagnosis not present

## 2021-06-15 DIAGNOSIS — E876 Hypokalemia: Secondary | ICD-10-CM | POA: Diagnosis not present

## 2021-06-15 DIAGNOSIS — I4819 Other persistent atrial fibrillation: Secondary | ICD-10-CM | POA: Diagnosis not present

## 2021-06-15 DIAGNOSIS — I110 Hypertensive heart disease with heart failure: Secondary | ICD-10-CM | POA: Diagnosis not present

## 2021-06-15 DIAGNOSIS — M79674 Pain in right toe(s): Secondary | ICD-10-CM | POA: Diagnosis not present

## 2021-06-15 DIAGNOSIS — E119 Type 2 diabetes mellitus without complications: Secondary | ICD-10-CM | POA: Diagnosis not present

## 2021-06-15 NOTE — Telephone Encounter (Signed)
Spoke with patient who reports sob x 2 days.  A week and half ago, her PCP doubled her lasix for 3 days with good results. Last Wednesday, her PCP told her her lungs sounded good. Her sob is noted over the phone. I informed her that if her breathing gets worse, she needs to go to the ER. She said she would. Please advise.

## 2021-06-15 NOTE — Progress Notes (Signed)
This patient returns to my office for at risk foot care.  This patient requires this care by a professional since this patient will be at risk due to having coagulation defect and diabetes.  Patient is taking eliquis.   This patient is unable to cut nails herself since the patient cannot reach her nails.These nails are painful walking and wearing shoes.  This patient presents for at risk foot care today.  General Appearance  Alert, conversant and in no acute stress.  Vascular  Dorsalis pedis and posterior tibial  pulses are palpable  bilaterally.  Capillary return is within normal limits  bilaterally. Temperature is within normal limits  bilaterally.  Neurologic  Senn-Weinstein monofilament wire test within normal limits  bilaterally. Muscle power within normal limits bilaterally.  Nails Thick disfigured discolored nails with subungual debris  from hallux to fifth toes bilaterally. No evidence of bacterial infection or drainage bilaterally.  Orthopedic  No limitations of motion  feet .  No crepitus or effusions noted.  No bony pathology or digital deformities noted. Hammer toes  B/L.  Skin  normotropic skin with no porokeratosis noted bilaterally.  No signs of infections or ulcers noted.   Contusion noted 2nd  MPJ right foot.  Minimal swelling.  Onychomycosis  Pain in right toes  Pain in left toes  Consent was obtained for treatment procedures.   Mechanical debridement of nails 1-5  bilaterally performed with a nail nipper.  Filed with dremel without incident.    Return office visit    3 months                  Told patient to return for periodic foot care and evaluation due to potential at risk complications.   Gardiner Barefoot DPM

## 2021-06-15 NOTE — Telephone Encounter (Signed)
Pt c/o Shortness Of Breath: STAT if SOB developed within the last 24 hours or pt is noticeably SOB on the phone  1. Are you currently SOB (can you hear that pt is SOB on the phone)? no  2. How long have you been experiencing SOB?a couple weeks, mainly since her hospital stay  3. Are you SOB when sitting or when up moving around?  Up and moving around  4. Are you currently experiencing any other symptoms? Swelling in ankles, HR goes up after just taking a short walk

## 2021-06-16 DIAGNOSIS — E113291 Type 2 diabetes mellitus with mild nonproliferative diabetic retinopathy without macular edema, right eye: Secondary | ICD-10-CM | POA: Diagnosis not present

## 2021-06-16 DIAGNOSIS — H5213 Myopia, bilateral: Secondary | ICD-10-CM | POA: Diagnosis not present

## 2021-06-16 DIAGNOSIS — H43813 Vitreous degeneration, bilateral: Secondary | ICD-10-CM | POA: Diagnosis not present

## 2021-06-16 DIAGNOSIS — H35372 Puckering of macula, left eye: Secondary | ICD-10-CM | POA: Diagnosis not present

## 2021-06-17 DIAGNOSIS — I4819 Other persistent atrial fibrillation: Secondary | ICD-10-CM | POA: Diagnosis not present

## 2021-06-17 DIAGNOSIS — I110 Hypertensive heart disease with heart failure: Secondary | ICD-10-CM | POA: Diagnosis not present

## 2021-06-17 DIAGNOSIS — E119 Type 2 diabetes mellitus without complications: Secondary | ICD-10-CM | POA: Diagnosis not present

## 2021-06-17 DIAGNOSIS — Z7901 Long term (current) use of anticoagulants: Secondary | ICD-10-CM | POA: Diagnosis not present

## 2021-06-17 DIAGNOSIS — Z9181 History of falling: Secondary | ICD-10-CM | POA: Diagnosis not present

## 2021-06-17 DIAGNOSIS — Z79891 Long term (current) use of opiate analgesic: Secondary | ICD-10-CM | POA: Diagnosis not present

## 2021-06-17 DIAGNOSIS — E876 Hypokalemia: Secondary | ICD-10-CM | POA: Diagnosis not present

## 2021-06-17 DIAGNOSIS — M80021D Age-related osteoporosis with current pathological fracture, right humerus, subsequent encounter for fracture with routine healing: Secondary | ICD-10-CM | POA: Diagnosis not present

## 2021-06-17 DIAGNOSIS — I5030 Unspecified diastolic (congestive) heart failure: Secondary | ICD-10-CM | POA: Diagnosis not present

## 2021-06-17 DIAGNOSIS — K5790 Diverticulosis of intestine, part unspecified, without perforation or abscess without bleeding: Secondary | ICD-10-CM | POA: Diagnosis not present

## 2021-06-17 DIAGNOSIS — Z96611 Presence of right artificial shoulder joint: Secondary | ICD-10-CM | POA: Diagnosis not present

## 2021-06-17 DIAGNOSIS — Z853 Personal history of malignant neoplasm of breast: Secondary | ICD-10-CM | POA: Diagnosis not present

## 2021-06-17 DIAGNOSIS — E785 Hyperlipidemia, unspecified: Secondary | ICD-10-CM | POA: Diagnosis not present

## 2021-06-17 DIAGNOSIS — I712 Thoracic aortic aneurysm, without rupture, unspecified: Secondary | ICD-10-CM | POA: Diagnosis not present

## 2021-06-17 MED ORDER — METOPROLOL SUCCINATE ER 25 MG PO TB24: 25.0000 mg | ORAL_TABLET | Freq: Two times a day (BID) | ORAL | Status: DC

## 2021-06-17 NOTE — Telephone Encounter (Signed)
Spoke with pt, she is not currently taking the furosemide daily and she was given the okay to take as needed for swelling and SOB. She thinks the metoprolol maybe causing the SOB. She was given the okay to try taking 1 tablet twice daily to see if that makes a difference. She will call back to the office with concerns. Lab orders mailed to the pt

## 2021-06-18 DIAGNOSIS — Z4789 Encounter for other orthopedic aftercare: Secondary | ICD-10-CM | POA: Diagnosis not present

## 2021-06-19 DIAGNOSIS — Z96611 Presence of right artificial shoulder joint: Secondary | ICD-10-CM | POA: Diagnosis not present

## 2021-06-19 DIAGNOSIS — E119 Type 2 diabetes mellitus without complications: Secondary | ICD-10-CM | POA: Diagnosis not present

## 2021-06-19 DIAGNOSIS — Z79891 Long term (current) use of opiate analgesic: Secondary | ICD-10-CM | POA: Diagnosis not present

## 2021-06-19 DIAGNOSIS — I110 Hypertensive heart disease with heart failure: Secondary | ICD-10-CM | POA: Diagnosis not present

## 2021-06-19 DIAGNOSIS — I4819 Other persistent atrial fibrillation: Secondary | ICD-10-CM | POA: Diagnosis not present

## 2021-06-19 DIAGNOSIS — I712 Thoracic aortic aneurysm, without rupture, unspecified: Secondary | ICD-10-CM | POA: Diagnosis not present

## 2021-06-19 DIAGNOSIS — Z853 Personal history of malignant neoplasm of breast: Secondary | ICD-10-CM | POA: Diagnosis not present

## 2021-06-19 DIAGNOSIS — M80021D Age-related osteoporosis with current pathological fracture, right humerus, subsequent encounter for fracture with routine healing: Secondary | ICD-10-CM | POA: Diagnosis not present

## 2021-06-19 DIAGNOSIS — K5790 Diverticulosis of intestine, part unspecified, without perforation or abscess without bleeding: Secondary | ICD-10-CM | POA: Diagnosis not present

## 2021-06-19 DIAGNOSIS — Z7901 Long term (current) use of anticoagulants: Secondary | ICD-10-CM | POA: Diagnosis not present

## 2021-06-19 DIAGNOSIS — E785 Hyperlipidemia, unspecified: Secondary | ICD-10-CM | POA: Diagnosis not present

## 2021-06-19 DIAGNOSIS — I5030 Unspecified diastolic (congestive) heart failure: Secondary | ICD-10-CM | POA: Diagnosis not present

## 2021-06-19 DIAGNOSIS — E876 Hypokalemia: Secondary | ICD-10-CM | POA: Diagnosis not present

## 2021-06-19 DIAGNOSIS — Z9181 History of falling: Secondary | ICD-10-CM | POA: Diagnosis not present

## 2021-06-20 ENCOUNTER — Other Ambulatory Visit: Payer: Self-pay | Admitting: Cardiology

## 2021-06-22 ENCOUNTER — Inpatient Hospital Stay (HOSPITAL_COMMUNITY)
Admission: EM | Admit: 2021-06-22 | Discharge: 2021-06-26 | DRG: 291 | Disposition: A | Discharge status: Home Health | Payer: Medicare Other | Attending: Family Medicine | Admitting: Family Medicine

## 2021-06-22 ENCOUNTER — Emergency Department (HOSPITAL_COMMUNITY): Payer: Medicare Other

## 2021-06-22 DIAGNOSIS — Z885 Allergy status to narcotic agent status: Secondary | ICD-10-CM

## 2021-06-22 DIAGNOSIS — I10 Essential (primary) hypertension: Secondary | ICD-10-CM | POA: Diagnosis not present

## 2021-06-22 DIAGNOSIS — Z20822 Contact with and (suspected) exposure to covid-19: Secondary | ICD-10-CM | POA: Diagnosis not present

## 2021-06-22 DIAGNOSIS — I358 Other nonrheumatic aortic valve disorders: Secondary | ICD-10-CM | POA: Diagnosis not present

## 2021-06-22 DIAGNOSIS — Z888 Allergy status to other drugs, medicaments and biological substances status: Secondary | ICD-10-CM

## 2021-06-22 DIAGNOSIS — E1165 Type 2 diabetes mellitus with hyperglycemia: Secondary | ICD-10-CM | POA: Diagnosis not present

## 2021-06-22 DIAGNOSIS — I4819 Other persistent atrial fibrillation: Secondary | ICD-10-CM | POA: Diagnosis present

## 2021-06-22 DIAGNOSIS — M810 Age-related osteoporosis without current pathological fracture: Secondary | ICD-10-CM | POA: Diagnosis not present

## 2021-06-22 DIAGNOSIS — J189 Pneumonia, unspecified organism: Secondary | ICD-10-CM | POA: Diagnosis not present

## 2021-06-22 DIAGNOSIS — Z79899 Other long term (current) drug therapy: Secondary | ICD-10-CM | POA: Diagnosis not present

## 2021-06-22 DIAGNOSIS — I5043 Acute on chronic combined systolic (congestive) and diastolic (congestive) heart failure: Secondary | ICD-10-CM | POA: Diagnosis present

## 2021-06-22 DIAGNOSIS — E118 Type 2 diabetes mellitus with unspecified complications: Secondary | ICD-10-CM

## 2021-06-22 DIAGNOSIS — Z7901 Long term (current) use of anticoagulants: Secondary | ICD-10-CM

## 2021-06-22 DIAGNOSIS — Z90710 Acquired absence of both cervix and uterus: Secondary | ICD-10-CM | POA: Diagnosis not present

## 2021-06-22 DIAGNOSIS — R0602 Shortness of breath: Secondary | ICD-10-CM | POA: Diagnosis not present

## 2021-06-22 DIAGNOSIS — R0609 Other forms of dyspnea: Secondary | ICD-10-CM | POA: Diagnosis not present

## 2021-06-22 DIAGNOSIS — J9601 Acute respiratory failure with hypoxia: Secondary | ICD-10-CM | POA: Diagnosis not present

## 2021-06-22 DIAGNOSIS — H109 Unspecified conjunctivitis: Secondary | ICD-10-CM | POA: Diagnosis not present

## 2021-06-22 DIAGNOSIS — I110 Hypertensive heart disease with heart failure: Principal | ICD-10-CM | POA: Diagnosis present

## 2021-06-22 DIAGNOSIS — I712 Thoracic aortic aneurysm, without rupture, unspecified: Secondary | ICD-10-CM | POA: Diagnosis present

## 2021-06-22 DIAGNOSIS — E785 Hyperlipidemia, unspecified: Secondary | ICD-10-CM | POA: Diagnosis not present

## 2021-06-22 DIAGNOSIS — Z90722 Acquired absence of ovaries, bilateral: Secondary | ICD-10-CM

## 2021-06-22 DIAGNOSIS — F419 Anxiety disorder, unspecified: Secondary | ICD-10-CM | POA: Diagnosis present

## 2021-06-22 DIAGNOSIS — Z96611 Presence of right artificial shoulder joint: Secondary | ICD-10-CM | POA: Diagnosis present

## 2021-06-22 DIAGNOSIS — Z8249 Family history of ischemic heart disease and other diseases of the circulatory system: Secondary | ICD-10-CM

## 2021-06-22 DIAGNOSIS — Z91040 Latex allergy status: Secondary | ICD-10-CM

## 2021-06-22 DIAGNOSIS — Z8781 Personal history of (healed) traumatic fracture: Secondary | ICD-10-CM | POA: Diagnosis not present

## 2021-06-22 DIAGNOSIS — F5104 Psychophysiologic insomnia: Secondary | ICD-10-CM | POA: Diagnosis present

## 2021-06-22 DIAGNOSIS — Z853 Personal history of malignant neoplasm of breast: Secondary | ICD-10-CM

## 2021-06-22 DIAGNOSIS — I5033 Acute on chronic diastolic (congestive) heart failure: Secondary | ICD-10-CM | POA: Diagnosis not present

## 2021-06-22 DIAGNOSIS — J129 Viral pneumonia, unspecified: Secondary | ICD-10-CM | POA: Diagnosis present

## 2021-06-22 DIAGNOSIS — R000 Tachycardia, unspecified: Secondary | ICD-10-CM | POA: Diagnosis not present

## 2021-06-22 DIAGNOSIS — M1990 Unspecified osteoarthritis, unspecified site: Secondary | ICD-10-CM | POA: Diagnosis not present

## 2021-06-22 DIAGNOSIS — Z9012 Acquired absence of left breast and nipple: Secondary | ICD-10-CM

## 2021-06-22 DIAGNOSIS — I509 Heart failure, unspecified: Secondary | ICD-10-CM

## 2021-06-22 DIAGNOSIS — Z833 Family history of diabetes mellitus: Secondary | ICD-10-CM

## 2021-06-22 LAB — TROPONIN I (HIGH SENSITIVITY)
Troponin I (High Sensitivity): 12 ng/L (ref <18)
Troponin I (High Sensitivity): 11 ng/L (ref <18)

## 2021-06-22 LAB — COMPREHENSIVE METABOLIC PANEL
ALT: 11 U/L (ref 0–44)
AST: 19 U/L (ref 15–41)
Albumin: 3.4 g/dL — ABNORMAL LOW (ref 3.5–5.0)
Alkaline Phosphatase: 122 U/L (ref 38–126)
Anion gap: 8 (ref 5–15)
BUN: 13 mg/dL (ref 8–23)
CO2: 29 mmol/L (ref 22–32)
Calcium: 8.9 mg/dL (ref 8.9–10.3)
Chloride: 98 mmol/L (ref 98–111)
Creatinine, Ser: 0.85 mg/dL (ref 0.44–1.00)
GFR, Estimated: >60 mL/min (ref >60)
Glucose, Bld: 334 mg/dL — ABNORMAL HIGH (ref 70–99)
Potassium: 4 mmol/L (ref 3.5–5.1)
Sodium: 135 mmol/L (ref 135–145)
Total Bilirubin: 0.6 mg/dL (ref 0.3–1.2)
Total Protein: 6.1 g/dL — ABNORMAL LOW (ref 6.5–8.1)

## 2021-06-22 LAB — CBC WITH DIFFERENTIAL/PLATELET
Abs Immature Granulocytes: 0.03 10*3/uL (ref 0.00–0.07)
Basophils Absolute: 0 10*3/uL (ref 0.0–0.1)
Basophils Relative: 0 %
Eosinophils Absolute: 0 10*3/uL (ref 0.0–0.5)
Eosinophils Relative: 0 %
HCT: 37.7 % (ref 36.0–46.0)
Hemoglobin: 11.8 g/dL — ABNORMAL LOW (ref 12.0–15.0)
Immature Granulocytes: 0 %
Lymphocytes Relative: 6 %
Lymphs Abs: 0.4 10*3/uL — ABNORMAL LOW (ref 0.7–4.0)
MCH: 30.2 pg (ref 26.0–34.0)
MCHC: 31.3 g/dL (ref 30.0–36.0)
MCV: 96.4 fL (ref 80.0–100.0)
Monocytes Absolute: 0.9 10*3/uL (ref 0.1–1.0)
Monocytes Relative: 11 %
Neutro Abs: 6.3 10*3/uL (ref 1.7–7.7)
Neutrophils Relative %: 83 %
Platelets: 247 10*3/uL (ref 150–400)
RBC: 3.91 MIL/uL (ref 3.87–5.11)
RDW: 14.7 % (ref 11.5–15.5)
WBC: 7.6 10*3/uL (ref 4.0–10.5)
nRBC: 0 % (ref 0.0–0.2)

## 2021-06-22 LAB — RESP PANEL BY RT-PCR (FLU A&B, COVID) ARPGX2
Influenza A by PCR: NEGATIVE
Influenza B by PCR: NEGATIVE
SARS Coronavirus 2 by RT PCR: NEGATIVE

## 2021-06-22 LAB — MAGNESIUM: Magnesium: 1.7 mg/dL (ref 1.7–2.4)

## 2021-06-22 LAB — DIGOXIN LEVEL: Digoxin Level: 0.5 ng/mL — ABNORMAL LOW (ref 0.8–2.0)

## 2021-06-22 MED ORDER — METFORMIN HCL 500 MG PO TABS
1000.0000 mg | ORAL_TABLET | Freq: Once | ORAL | Status: AC
  Administered 2021-06-22: 1000 mg via ORAL
  Filled 2021-06-22: qty 2

## 2021-06-22 MED ORDER — FUROSEMIDE 10 MG/ML IJ SOLN
40.0000 mg | Freq: Two times a day (BID) | INTRAMUSCULAR | Status: DC
  Administered 2021-06-23 – 2021-06-25 (×4): 40 mg via INTRAVENOUS
  Filled 2021-06-22 (×4): qty 4

## 2021-06-22 MED ORDER — ACETAMINOPHEN 325 MG PO TABS: 650.0000 mg | ORAL_TABLET | Freq: Four times a day (QID) | ORAL | Status: DC | PRN

## 2021-06-22 MED ORDER — SODIUM CHLORIDE 0.9% FLUSH
3.0000 mL | Freq: Two times a day (BID) | INTRAVENOUS | Status: DC
  Administered 2021-06-23 – 2021-06-26 (×8): 3 mL via INTRAVENOUS

## 2021-06-22 MED ORDER — POLYMYXIN B-TRIMETHOPRIM 10000-0.1 UNIT/ML-% OP SOLN
2.0000 [drp] | OPHTHALMIC | Status: DC
  Administered 2021-06-23 – 2021-06-26 (×20): 2 [drp] via OPHTHALMIC
  Filled 2021-06-22: qty 10

## 2021-06-22 MED ORDER — SODIUM CHLORIDE 0.9 % IV SOLN
1.0000 g | Freq: Once | INTRAVENOUS | Status: AC
  Administered 2021-06-22: 1 g via INTRAVENOUS
  Filled 2021-06-22: qty 10

## 2021-06-22 MED ORDER — ACETAMINOPHEN 650 MG RE SUPP: 650.0000 mg | Freq: Four times a day (QID) | RECTAL | Status: DC | PRN

## 2021-06-22 MED ORDER — FUROSEMIDE 10 MG/ML IJ SOLN
40.0000 mg | Freq: Once | INTRAMUSCULAR | Status: AC
Start: 2021-06-22 — End: 2021-06-22
  Administered 2021-06-22: 40 mg via INTRAVENOUS
  Filled 2021-06-22: qty 4

## 2021-06-22 MED ORDER — AZITHROMYCIN 250 MG PO TABS
500.0000 mg | ORAL_TABLET | Freq: Once | ORAL | Status: AC
  Administered 2021-06-22: 500 mg via ORAL
  Filled 2021-06-22: qty 2

## 2021-06-22 MED ORDER — RIVAROXABAN 15 MG PO TABS
15.0000 mg | ORAL_TABLET | Freq: Once | ORAL | Status: AC
Start: 2021-06-22 — End: 2021-06-23
  Administered 2021-06-23: 15 mg via ORAL
  Filled 2021-06-22: qty 1

## 2021-06-22 MED ORDER — DIGOXIN 125 MCG PO TABS
0.0625 mg | ORAL_TABLET | Freq: Every day | ORAL | Status: DC
  Administered 2021-06-23 – 2021-06-26 (×4): 0.0625 mg via ORAL
  Filled 2021-06-22 (×4): qty 1

## 2021-06-22 MED ORDER — RIVAROXABAN 15 MG PO TABS
15.0000 mg | ORAL_TABLET | Freq: Every day | ORAL | Status: DC
  Administered 2021-06-23 – 2021-06-25 (×3): 15 mg via ORAL
  Filled 2021-06-22 (×4): qty 1

## 2021-06-22 MED ORDER — DILTIAZEM HCL ER COATED BEADS 360 MG PO CP24
360.0000 mg | ORAL_CAPSULE | Freq: Every day | ORAL | Status: DC
  Administered 2021-06-23: 360 mg via ORAL
  Filled 2021-06-22: qty 1

## 2021-06-22 MED ORDER — METOPROLOL TARTRATE 25 MG PO TABS
50.0000 mg | ORAL_TABLET | Freq: Once | ORAL | Status: AC
  Administered 2021-06-22: 50 mg via ORAL
  Filled 2021-06-22: qty 2

## 2021-06-22 MED ORDER — INSULIN ASPART 100 UNIT/ML IJ SOLN
0.0000 [IU] | Freq: Three times a day (TID) | INTRAMUSCULAR | Status: DC
  Administered 2021-06-23 (×2): 2 [IU] via SUBCUTANEOUS
  Administered 2021-06-24: 3 [IU] via SUBCUTANEOUS
  Administered 2021-06-24 – 2021-06-25 (×4): 2 [IU] via SUBCUTANEOUS
  Administered 2021-06-25 – 2021-06-26 (×2): 3 [IU] via SUBCUTANEOUS

## 2021-06-22 MED ORDER — POLYETHYLENE GLYCOL 3350 17 G PO PACK: 17.0000 g | PACK | Freq: Every day | ORAL | Status: DC | PRN

## 2021-06-22 MED ORDER — METOPROLOL SUCCINATE ER 50 MG PO TB24
50.0000 mg | ORAL_TABLET | Freq: Two times a day (BID) | ORAL | Status: DC
  Administered 2021-06-23 – 2021-06-25 (×5): 50 mg via ORAL
  Filled 2021-06-22: qty 1
  Filled 2021-06-22 (×3): qty 2
  Filled 2021-06-22: qty 1

## 2021-06-22 MED ORDER — ACETAMINOPHEN 500 MG PO TABS
1000.0000 mg | ORAL_TABLET | Freq: Once | ORAL | Status: AC
  Administered 2021-06-22: 1000 mg via ORAL
  Filled 2021-06-22: qty 2

## 2021-06-22 NOTE — H&P (Signed)
History and Physical   Tara Bradley DGL:[id number] DOB: Sep 05, 1922 DOA: 06/22/2021  PCP: [name], MD  Patient coming from: [facility]  Chief Complaint: Shortness of breath  HPI: Tara Bradley is a 88 y.o. female with medical history significant of thoracic aortic aneurysm, syncope, atrial fibrillation, hypertension, hyperlipidemia, diabetes, diverticulosis, CHF, cancer, anxiety, insomnia presenting with ongoing shortness of breath.  Patient states that she has had a couple weeks of shortness of breath.  She states that she is also had some increased lower extremity edema.  And she has had 1 to 2 weeks of increased cough congestion and fatigue.  She recently had her beta-blocker increased at her cardiologist visit from 25 to 50 mg twice daily.  She also had her Lasix increased from 20 to 40 mg daily with some improvement in her edema.  She also reports having a red eye.  She typically lives independently but does have family visiting from [place] at this time.  She states that she went to [facility] after-hours clinic to be evaluated and was sent to the ED for further evaluation due to hypoxia.  She denies fevers, chills, chest pain, abdominal pain, constipation, diarrhea, nausea, vomiting.  ED Course: Vital signs in the ED significant for heart rate in the 80s to 130s, respiratory rate in the 20s to 30s, initially saturating 87% on room air with improvement on 2 L.  Lab work-up showed CMP with glucose 334, protein 6.1, albumin 3.4.  CBC with hemoglobin stable 11.8.  Troponin normal with repeat pending.  Digoxin level low at 0.5.  BNP pending.  Respiratory panel flu COVID-negative.  Chest x-ray showed interstitial prominence with patchy disease at the left base consistent with atelectasis versus infiltrate.  Patient received Tylenol, ceftriaxone, azithromycin, Lasix, metoprolol, metformin, Xarelto, Polytrim in the ED.  Review of Systems: As per HPI otherwise all other  systems reviewed and are negative.  Past Medical History:  Diagnosis Date   Aortic valve sclerosis    Echo, 2008   Arthritis    Bradycardia    October, 2012   Cancer [facility])    Chronic insomnia    Closed fracture of unspecified part of femur 2005   Diabetes mellitus, type 2 ([place])    Diverticulitis    Diverticulosis    Ejection fraction    EF 60%, echo, February, 2008  //   EF 65-70%, echo, November, 2012   Hyperlipidemia    Hypertension    Long term (current) use of anticoagulants    Osteopenia    Osteoporosis    femur fracture 2005, pelvic fracture 2006   Persistent atrial fibrillation ([place])    Personal history of malignant neoplasm of breast    Syncope    Thoracic aortic aneurysm 02/2017   Unspecified closed fracture of pelvis 2006    Past Surgical History:  Procedure Laterality Date   CARDIOVERSION N/A 08/09/2012   Procedure: CARDIOVERSION;  Surgeon: Deboraha Sprang, MD;  Location: [place];  Service: Cardiovascular;  Laterality: N/A;   EYE SURGERY     FEMUR SURGERY  2005   ORIF   IR ANGIOGRAM PELVIS SELECTIVE OR SUPRASELECTIVE  03/11/2017   IR ANGIOGRAM SELECTIVE EACH ADDITIONAL VESSEL  03/11/2017   IR EMBO ART  VEN HEMORR LYMPH EXTRAV  INC GUIDE ROADMAPPING  03/11/2017   IR FLUORO GUIDE CV LINE RIGHT  03/11/2017   IR US GUIDE VASC ACCESS RIGHT  03/11/2017   IR US GUIDE VASC ACCESS RIGHT  03/11/2017   MASTECTOMY  1995   left   MINOR HARDWARE REMOVAL Left 09/01/2020   Procedure: REMOVAL OF HARDWARE LEFT FOREARM;  Surgeon: Milly Jakob, MD;  Location: [facility];  Service: Orthopedics;  Laterality: Left;   ORIF ULNAR FRACTURE Left 10/19/2019   Procedure: OPEN REDUCTION INTERNAL FIXATION (ORIF)  LEFT DISTAL RADIUS  AND ULNA FRACTURE;  Surgeon: Milly Jakob, MD;  Location: [place];  Service: Orthopedics;  Laterality: Left;   REVERSE SHOULDER ARTHROPLASTY Right 05/04/2021   Procedure: REVERSE SHOULDER ARTHROPLASTY;  Surgeon: Netta Cedars, MD;   Location: [place];  Service: Orthopedics;  Laterality: Right;   SHOULDER SURGERY     left   SHOULDER SURGERY  1979, 2003   TONSILLECTOMY     TOTAL ABDOMINAL HYSTERECTOMY W/ BILATERAL SALPINGOOPHORECTOMY  1995   WRIST SURGERY      x 2     Social History  reports that she has never smoked. She has never used smokeless tobacco. She reports that she does not drink alcohol and does not use drugs.  Allergies  Allergen Reactions   Ambien [Zolpidem Tartrate]     Hallucinations    Codeine Other (See Comments)    headache   Codeine Sulfate     REACTION: unspecified   Latex Hives   Remeron [Mirtazapine]     Family History  Problem Relation Age of Onset   Congestive Heart Failure Mother 27   Heart attack Father 23   Diabetes type II Father    Diabetes type II Brother        1/2   Multiple myeloma Brother        2/2   Diabetes type II Son    Colon cancer Neg Hx   Reviewed on admission  Prior to Admission medications   Medication Sig Start Date End Date Taking? Authorizing Provider  BD PEN NEEDLE NANO U/F 32G X 4 MM MISC Inject into the skin daily. 11/25/20   [provider]  Calcium Carbonate-Vitamin D 600-400 MG-UNIT tablet Take 1 tablet by mouth daily.    [provider]  Cholecalciferol (VITAMIN D) 2000 UNITS tablet Take 1,000 Units by mouth daily.    [provider]  digoxin (LANOXIN) 0.125 MG tablet TAKE 1/2 TABLET BY MOUTH EVERY DAY 06/22/21   [name], MD  diltiazem (CARDIZEM CD) 360 MG 24 hr capsule Take 1 capsule (360 mg total) by mouth daily. 05/12/21   Shelly Coss, MD  furosemide (LASIX) 20 MG tablet Take 1 tablet (20 mg total) by mouth daily. 05/12/21   Shelly Coss, MD  HUMALOG MIX 50/50 KWIKPEN (50-50) 100 UNIT/ML KwikPen Inject 5 Units into the skin daily. 04/28/21   [provider]  metFORMIN (GLUMETZA) 500 MG (MOD) 24 hr tablet Take 1,000 mg by mouth every evening.    [provider]  metoprolol succinate  (TOPROL-XL) 25 MG 24 hr tablet Take 1 tablet (25 mg total) by mouth 2 (two) times daily. 06/17/21   [name], MD  polyethylene glycol (MIRALAX / GLYCOLAX) 17 g packet Take 17 g by mouth daily. 05/12/21   Shelly Coss, MD  potassium chloride (K-DUR) 10 MEQ tablet Take 10 mEq by mouth daily.  03/25/15   [provider]  senna-docusate (SENOKOT-S) 8.6-50 MG tablet Take 1 tablet by mouth 2 (two) times daily. 05/11/21   Shelly Coss, MD  XARELTO 15 MG TABS tablet TAKE 1 TABLET ONCE DAILY WITH SUPPER. 06/22/21   [name], MD    Physical Exam: Vitals:   06/22/21  2045 06/22/21 2115 06/22/21 2130 06/22/21 2145  BP: 136/86 (!) 142/97 135/88 (!) 132/94  Pulse: 86 77 (!) 114 (!) 131  Resp: (!) _0 (!) 25  Temp:      TempSrc:      SpO2: 100% 100% 98% 99%    Physical Exam Constitutional:      General: She is not in acute distress.    Appearance: Normal appearance.  HENT:     Head: Normocephalic and atraumatic.     Mouth/Throat:     Mouth: Mucous membranes are moist.     Pharynx: Oropharynx is clear.  Eyes:     Extraocular Movements: Extraocular movements intact.     Pupils: Pupils are equal, round, and reactive to light.     Comments: Conjunctival injection  Cardiovascular:     Rate and Rhythm: Normal rate. Rhythm irregular.     Pulses: Normal pulses.     Heart sounds: Normal heart sounds.  Pulmonary:     Effort: Pulmonary effort is normal. No respiratory distress.     Breath sounds: Normal breath sounds.  Abdominal:     General: Bowel sounds are normal. There is no distension.     Palpations: Abdomen is soft.     Tenderness: There is no abdominal tenderness.  Musculoskeletal:        General: No swelling or deformity.     Right lower leg: Edema present.     Left lower leg: Edema present.  Skin:    General: Skin is warm and dry.  Neurological:     General: No focal deficit present.     Mental Status: Mental status is at baseline.   Labs on  Admission: I have personally reviewed following labs and imaging studies  CBC: Recent Labs  Lab 06/22/21 1950  WBC 7.6  NEUTROABS 6.3  HGB 11.8*  HCT 37.7  MCV 96.4  PLT 237    Basic Metabolic Panel: Recent Labs  Lab 06/22/21 1950  NA 135  K 4.0  CL 98  CO2 29  GLUCOSE 334*  BUN 13  CREATININE 0.85  CALCIUM 8.9    GFR: CrCl cannot be calculated (Unknown ideal weight.).  Liver Function Tests: Recent Labs  Lab 06/22/21 1950  AST 19  ALT 11  ALKPHOS 122  BILITOT 0.6  PROT 6.1*  ALBUMIN 3.4*    Urine analysis:    Component Value Date/Time   COLORURINE YELLOW 05/03/2021 0645   APPEARANCEUR CLEAR 05/03/2021 0645   LABSPEC 1.021 05/03/2021 0645   PHURINE 6.0 05/03/2021 0645   GLUCOSEU 150 (A) 05/03/2021 0645   HGBUR NEGATIVE 05/03/2021 0645   BILIRUBINUR NEGATIVE 05/03/2021 0645   BILIRUBINUR n 07/23/2013 1343   KETONESUR 5 (A) 05/03/2021 0645   PROTEINUR NEGATIVE 05/03/2021 0645   UROBILINOGEN 0.2 07/23/2013 1343   UROBILINOGEN 0.2 03/07/2012 2123   NITRITE NEGATIVE 05/03/2021 0645   LEUKOCYTESUR NEGATIVE 05/03/2021 0645    Radiological Exams on Admission: DG Chest 2 View  Result Date: 06/22/2021 CLINICAL DATA:  Shortness of breath. EXAM: CHEST - 2 VIEW COMPARISON:  05/06/2021. FINDINGS: The heart size and mediastinal contours are stable. Atherosclerotic calcification of the aorta is noted. Interstitial prominence is present bilaterally. There is mild airspace disease at the left lung base. No effusion or pneumothorax. Shoulder arthroplasty changes are noted on the right. No acute osseous abnormality. IMPRESSION: Interstitial prominence bilaterally with patchy airspace disease at the left lung base, possible atelectasis or infiltrate. Electronically Signed   By: Brett Fairy  M.D.   On: 06/22/2021 20:22    EKG: Independently reviewed.  Fibrillation at 105 bpm.  Some baseline artifact versus fibrillation.  Nonspecific T wave  changes.  Assessment/Plan Principal Problem:   CHF exacerbation (HCC)   CHF exacerbation Respiratory failure with hypoxia ?  Viral pneumonia > Patient presenting with ongoing shortness of breath that has been progressive with worsening edema as well partially responsive to increased dose of Lasix. > Also with increased cough, congestion, fatigue for the past week or 2. > Chest x-ray showing interstitial prominence possible left base infiltrate versus atelectasis.  No leukocytosis.  Dig level low at 0.5.  BNP pending. > Mentation consistent with CHF exacerbation with possible viral pneumonia is no leukocytosis to indicate bacterial pneumonia.  Does have family visiting from [place] who has a young child and she does have a conjunctivitis just once or viral. - Monitor on telemetry - Continue with Lasix 40 mg twice daily IV - Strict I's and O's, daily weights - Echocardiogram - Trend renal function electrolytes - Check magnesium now - Check RVP - Trend fever curve and white count  Conjunctivitis - Continue with Polytrim  Atrial fibrillation > Has history of atrial fibrillation which has been historically difficult to control.  Did have episode of RVR in the ED which resolved when she got her home dose of metoprolol. - Continue home digoxin, diltiazem, metoprolol - Continue home Xarelto  Hypertension - Continue home diltiazem, metoprolol - Lasix as above  Diabetes - SSI  DVT prophylaxis: Xarelto Code Status:   Full Family Communication:  None on admission.  Patient states her family knows she is here. Disposition Plan:   Patient is from:  Home  Anticipated DC to:  Home  Anticipated DC date:  1-3 days  Anticipated DC barriers: None  Consults called:  None Admission status:  Observation, telemetry  Severity of Illness: The appropriate patient status for this patient is OBSERVATION. Observation status is judged to be reasonable and necessary in order to provide the required  intensity of service to ensure the patient's safety. The patient's presenting symptoms, physical exam findings, and initial radiographic and laboratory data in the context of their medical condition is felt to place them at decreased risk for further clinical deterioration. Furthermore, it is anticipated that the patient will be medically stable for discharge from the hospital within 2 midnights of admission.    Marcelyn Bruins MD [facility]  How to contact the [facility] Attending or Consulting provider [place] or covering provider during after hours [place], for this patient?   Check the care team in [facility] and look for a) attending/consulting [facility] provider listed and b) the [facility] team listed Log into [email or web address] and use [place]'s universal password to access. If you do not have the password, please contact the hospital operator. Locate the [facility] provider you are looking for under [facility] and page to a number that you can be directly reached. If you still have difficulty reaching the provider, please page the [facility] (Director on Call) for the Hospitalists listed on amion for assistance.  06/22/2021, 10:47 PM

## 2021-06-22 NOTE — ED Provider Notes (Signed)
[facility] EMERGENCY DEPARTMENT Provider Note   CSN: [id number] Arrival date & time: 06/22/21  1843     History  Chief Complaint  Patient presents with   Shortness of Breath    Tara Bradley is a 88 y.o. female.  Patient is a 88 year old female who has a history of hypertension, diabetes, hyperlipidemia, persistent atrial fibrillation, thoracic aortic aneurysm who is presenting today with several days of worsening cough, congestion, shortness of breath.  However for the last week she had noticed some swelling in her legs and some mild dyspnea on exertion.  The swelling in her legs seem to improve with elevation but in the last 2 days she has had much more cough and congestion.  She is unaware if she has had a fever but has been sleeping constantly and not had much energy.  She called her doctor and finally went to the [facility] walk-in [facility] today they told her to come here for further evaluation.  Upon arrival here patient's oxygen saturation was 88% on room air.  She does not usually wear oxygen at home and is able to live independently.  Her granddaughter is currently visiting from [place] so they have been staying with her.  She denies any chest pain, abdominal pain or diarrhea.  She has had no recent medication changes.  She does take Xarelto, Cardizem, toprol, lasix and digoxin.  The history is provided by the patient and a relative.  Shortness of Breath     Home Medications Prior to Admission medications   Medication Sig Start Date End Date Taking? Authorizing Provider  BD PEN NEEDLE NANO U/F 32G X 4 MM MISC Inject into the skin daily. 11/25/20   [provider]  Calcium Carbonate-Vitamin D 600-400 MG-UNIT tablet Take 1 tablet by mouth daily.    [provider]  Cholecalciferol (VITAMIN D) 2000 UNITS tablet Take 1,000 Units by mouth daily.    [provider]  digoxin (LANOXIN) 0.125 MG tablet TAKE 1/2 TABLET BY MOUTH EVERY DAY 06/22/21    [name], MD  diltiazem (CARDIZEM CD) 360 MG 24 hr capsule Take 1 capsule (360 mg total) by mouth daily. 05/12/21   Shelly Coss, MD  furosemide (LASIX) 20 MG tablet Take 1 tablet (20 mg total) by mouth daily. 05/12/21   Shelly Coss, MD  HUMALOG MIX 50/50 KWIKPEN (50-50) 100 UNIT/ML KwikPen Inject 5 Units into the skin daily. 04/28/21   [provider]  metFORMIN (GLUMETZA) 500 MG (MOD) 24 hr tablet Take 1,000 mg by mouth every evening.    [provider]  metoprolol succinate (TOPROL-XL) 25 MG 24 hr tablet Take 1 tablet (25 mg total) by mouth 2 (two) times daily. 06/17/21   [name], MD  polyethylene glycol (MIRALAX / GLYCOLAX) 17 g packet Take 17 g by mouth daily. 05/12/21   Shelly Coss, MD  potassium chloride (K-DUR) 10 MEQ tablet Take 10 mEq by mouth daily.  03/25/15   [provider]  senna-docusate (SENOKOT-S) 8.6-50 MG tablet Take 1 tablet by mouth 2 (two) times daily. 05/11/21   Shelly Coss, MD  XARELTO 15 MG TABS tablet TAKE 1 TABLET ONCE DAILY WITH SUPPER. 06/22/21   [name], MD      Allergies    Ambien [zolpidem tartrate], Codeine, Codeine sulfate, Latex, and Remeron [mirtazapine]    Review of Systems   Review of Systems  Respiratory:  Positive for shortness of breath.    Physical Exam Updated Vital Signs BP [name])  138/91 (BP Location: Left Arm)    Pulse (!) 127    Temp 99.1 F (37.3 C) (Oral)    Resp (!) 22    SpO2 (!) 88%  Physical Exam Vitals and nursing note reviewed.  Constitutional:      General: She is in acute distress.     Appearance: She is well-developed.  HENT:     Head: Normocephalic and atraumatic.  Eyes:     General:        Right eye: Discharge present.     Pupils: Pupils are equal, round, and reactive to light.  Cardiovascular:     Rate and Rhythm: Tachycardia present. Rhythm irregularly irregular.     Heart sounds: Normal heart sounds. No murmur heard.   No friction rub.  Pulmonary:     Effort:  Pulmonary effort is normal.     Breath sounds: Normal breath sounds. No wheezing or rales.  Abdominal:     General: Bowel sounds are normal. There is no distension.     Palpations: Abdomen is soft.     Tenderness: There is no abdominal tenderness. There is no guarding or rebound.  Musculoskeletal:        General: No tenderness. Normal range of motion.     Right lower leg: Edema present.     Left lower leg: Edema present.     Comments: 1+ pitting edema in the ankles bilaterally  Skin:    General: Skin is warm and dry.     Findings: No rash.  Neurological:     Mental Status: She is alert and oriented to person, place, and time.     Cranial Nerves: No cranial nerve deficit.  Psychiatric:        Mood and Affect: Mood normal.        Behavior: Behavior normal.    ED Results / Procedures / Treatments   Labs (all labs ordered are listed, but only abnormal results are displayed) Labs Reviewed  CBC WITH DIFFERENTIAL/PLATELET - Abnormal; Notable for the following components:      Result Value   Hemoglobin 11.8 (*)    Lymphs Abs 0.4 (*)    All other components within normal limits  COMPREHENSIVE METABOLIC PANEL - Abnormal; Notable for the following components:   Glucose, Bld 334 (*)    Total Protein 6.1 (*)    Albumin 3.4 (*)    All other components within normal limits  DIGOXIN LEVEL - Abnormal; Notable for the following components:   Digoxin Level 0.5 (*)    All other components within normal limits  RESP PANEL BY RT-PCR (FLU A&B, COVID) [id number]  BRAIN NATRIURETIC PEPTIDE  TROPONIN I (HIGH SENSITIVITY)  TROPONIN I (HIGH SENSITIVITY)    EKG EKG Interpretation  Date/Time:  Monday June 22 2021 19:21:38 EST Ventricular Rate:  105 PR Interval:    QRS Duration: 76 QT Interval:  358 QTC Calculation: 473 R Axis:   -81 Text Interpretation: Atrial fibrillation with rapid ventricular response Left axis deviation Anteroseptal infarct , age undetermined ST & T wave abnormality,  consider inferolateral ischemia No significant change since last tracing When compared with ECG of 02-May-2021 05:11, PREVIOUS ECG IS PRESENT Confirmed by Blanchie Dessert [phone]) on 06/22/2021 8:39:07 PM  Radiology DG Chest 2 View  Result Date: 06/22/2021 CLINICAL DATA:  Shortness of breath. EXAM: CHEST - 2 VIEW COMPARISON:  05/06/2021. FINDINGS: The heart size and mediastinal contours are stable. Atherosclerotic calcification of the aorta is noted. Interstitial prominence is present bilaterally. There  is mild airspace disease at the left lung base. No effusion or pneumothorax. Shoulder arthroplasty changes are noted on the right. No acute osseous abnormality. IMPRESSION: Interstitial prominence bilaterally with patchy airspace disease at the left lung base, possible atelectasis or infiltrate. Electronically Signed   By: Brett Fairy M.D.   On: 06/22/2021 20:22    Procedures Procedures    Medications Ordered in ED Medications - No data to display  ED Course/ Medical Decision Making/ A&P                           Medical Decision Making Amount and/or Complexity of Data Reviewed External Data Reviewed: notes. Labs: ordered. Decision-making details documented in ED Course. Radiology: ordered and independent interpretation performed. Decision-making details documented in ED Course. ECG/medicine tests: ordered and independent interpretation performed. Decision-making details documented in ED Course.  Risk OTC drugs. Prescription drug management. Decision regarding hospitalization.   Patient is a 88 year old female with CHF, atrial fibrillation on numerous medication for rate control who is still living independently and over the last 2 to 3 weeks has been having issues with volume overload that improves when she increases her Lasix but then returns when she discontinues it back to 20 mg/day however in the last 2 days has had more URI type symptoms.  Patient has had worsening shortness of  breath and was hypoxic upon arrival here.  She does have what appears to be conjunctivitis as well with temperatures of 99.1 and mild tachypnea.  She still has evidence of volume overload as well.  Concern for new infectious etiology as well as CHF exacerbation.  Patient's atrial fibrillation is relatively controlled with heart rates between 90-110.  She was given her home dose of 50 mg of metoprolol which she usually takes at night.  She sees Dr. Percival Spanish and reported he recently increased her metoprolol to 50 mg instead of 25.  However she is still taking the diltiazem and digoxin as well for rate control.  She denies any chest pain at this time.  She is awake alert and oriented.  Blood pressure is within normal limits.  She is on 2 L of oxygen at this time and oxygen saturation is now 100%.  She reports feeling much better after the oxygen was started.  She has been hosting her granddaughter who has come to visit from [place] who has a 43-year-old daughter who has had some cough and congestion which could be where she got it.  She tested negative for COVID this morning with a home test.  External medical records from patient's cardiologist were reviewed.  I independently evaluated patient's chest x-ray she has widened mediastinum, cardiomegaly but no pleural effusions.  Radiology reported possible new infiltrate versus atelectasis.  I independently evaluated patient's labs and her CBC without acute findings, CMP with blood sugar of 300 but stable creatinine and LFTs.  I independently evaluated patient's EKG which shows atrial fibrillation but no other acute findings.  BNP, troponin and digoxin levels are pending.  Patient given Polytrim drops for her eyes.  COVID and flu are negative.  10:47 PM Trop x 2 is neg. BNP pending.  Pt is given abx for possible early PNA based on CXR.  Pt also given IV lasix due to fluid overload and hypoxia.  Even increased morbidity and mortality of patient's condition she meets  admission criteria.  Findings discussed with the hospitalist for admission.  Findings discussed with the patient and her  family and questions were answered.  She was given a home dose of her rate control medications that she takes at night as well.  CRITICAL CARE Performed by: Carden Teel Total critical care time: 30 minutes Critical care time was exclusive of separately billable procedures and treating other patients. Critical care was necessary to treat or prevent imminent or life-threatening deterioration. Critical care was time spent personally by me on the following activities: development of treatment plan with patient and/or surrogate as well as nursing, discussions with consultants, evaluation of patient's response to treatment, examination of patient, obtaining history from patient or surrogate, ordering and performing treatments and interventions, ordering and review of laboratory studies, ordering and review of radiographic studies, pulse oximetry and re-evaluation of patient's condition.         Final Clinical Impression(s) / ED Diagnoses Final diagnoses:  Community acquired pneumonia, unspecified laterality  Acute on chronic congestive heart failure, unspecified heart failure type ([facility])  Acute respiratory failure with hypoxia [facility])    Rx / DC Orders ED Discharge Orders     None         Blanchie Dessert, MD 06/22/21 2249

## 2021-06-22 NOTE — Telephone Encounter (Signed)
Prescription refill request for Xarelto received.  Indication:Afib Last office visit:1/23 Weight:65.1 kg Age:88 Scr:0.6 CrCl:55.08 ml/min  Prescription refilled

## 2021-06-22 NOTE — ED Triage Notes (Signed)
[facility] went [facility] and was dx with PNA. Patient has felt increase in SOB for the last week. Patient 87% of RA. 2L [place] applied in triage.   Patient also has noticed increased swelling in feet over the last week and red irritated right eye.   Hx of Afib.

## 2021-06-22 NOTE — ED Provider Triage Note (Signed)
Emergency Medicine Provider Triage Evaluation Note  Tara Bradley , a 88 y.o. female  was evaluated in triage.  Pt complains of shortness of breath for 1 week.  Patient states that she went to [facility] and was diagnosed with pneumonia after provider listened to her lung sounds.  She is continued to have increased shortness of breath, as well as increased swelling in both of her feet.  She also has an irritated right red eye.  Does have a history of persistent atrial fibrillation.  Review of Systems  Positive: Shortness of breath, red eye redness and drainage Negative: Chest pain, fever  Physical Exam  BP (!) 138/91 (BP Location: Left Arm)    Pulse (!) 127    Temp 99.1 F (37.3 C) (Oral)    Resp (!) 22    SpO2 (!) 88%  Gen:   Awake, no distress   Resp:  Normal effort  MSK:   Moves extremities without difficulty  Other:  Patient was noted to have an oxygen saturation of 87% on room air, 2 L nasal cannula was applied in triage; noted to have wheezing in L > R  Has red right eye with yellow drainage  Medical Decision Making  Medically screening exam initiated at 7:31 PM.  Appropriate orders placed.  Tara Bradley was informed that the remainder of the evaluation will be completed by another provider, this initial triage assessment does not replace that evaluation, and the importance of remaining in the ED until their evaluation is complete.     Tara Bradley, Tara Bradley 06/22/21 1939

## 2021-06-23 ENCOUNTER — Observation Stay (HOSPITAL_BASED_OUTPATIENT_CLINIC_OR_DEPARTMENT_OTHER): Payer: Medicare Other

## 2021-06-23 DIAGNOSIS — Z90722 Acquired absence of ovaries, bilateral: Secondary | ICD-10-CM | POA: Diagnosis not present

## 2021-06-23 DIAGNOSIS — F419 Anxiety disorder, unspecified: Secondary | ICD-10-CM | POA: Diagnosis present

## 2021-06-23 DIAGNOSIS — Z79899 Other long term (current) drug therapy: Secondary | ICD-10-CM | POA: Diagnosis not present

## 2021-06-23 DIAGNOSIS — I358 Other nonrheumatic aortic valve disorders: Secondary | ICD-10-CM | POA: Diagnosis present

## 2021-06-23 DIAGNOSIS — Z96611 Presence of right artificial shoulder joint: Secondary | ICD-10-CM | POA: Diagnosis present

## 2021-06-23 DIAGNOSIS — I509 Heart failure, unspecified: Secondary | ICD-10-CM | POA: Diagnosis present

## 2021-06-23 DIAGNOSIS — R0609 Other forms of dyspnea: Secondary | ICD-10-CM | POA: Diagnosis not present

## 2021-06-23 DIAGNOSIS — Z20822 Contact with and (suspected) exposure to covid-19: Secondary | ICD-10-CM | POA: Diagnosis present

## 2021-06-23 DIAGNOSIS — Z885 Allergy status to narcotic agent status: Secondary | ICD-10-CM | POA: Diagnosis not present

## 2021-06-23 DIAGNOSIS — I4819 Other persistent atrial fibrillation: Secondary | ICD-10-CM | POA: Diagnosis not present

## 2021-06-23 DIAGNOSIS — M810 Age-related osteoporosis without current pathological fracture: Secondary | ICD-10-CM | POA: Diagnosis present

## 2021-06-23 DIAGNOSIS — I110 Hypertensive heart disease with heart failure: Secondary | ICD-10-CM | POA: Diagnosis present

## 2021-06-23 DIAGNOSIS — E1165 Type 2 diabetes mellitus with hyperglycemia: Secondary | ICD-10-CM | POA: Diagnosis present

## 2021-06-23 DIAGNOSIS — J129 Viral pneumonia, unspecified: Secondary | ICD-10-CM | POA: Diagnosis present

## 2021-06-23 DIAGNOSIS — Z8781 Personal history of (healed) traumatic fracture: Secondary | ICD-10-CM | POA: Diagnosis not present

## 2021-06-23 DIAGNOSIS — H109 Unspecified conjunctivitis: Secondary | ICD-10-CM | POA: Diagnosis present

## 2021-06-23 DIAGNOSIS — M1990 Unspecified osteoarthritis, unspecified site: Secondary | ICD-10-CM | POA: Diagnosis present

## 2021-06-23 DIAGNOSIS — J189 Pneumonia, unspecified organism: Secondary | ICD-10-CM | POA: Diagnosis not present

## 2021-06-23 DIAGNOSIS — F5104 Psychophysiologic insomnia: Secondary | ICD-10-CM | POA: Diagnosis present

## 2021-06-23 DIAGNOSIS — Z9012 Acquired absence of left breast and nipple: Secondary | ICD-10-CM | POA: Diagnosis not present

## 2021-06-23 DIAGNOSIS — I712 Thoracic aortic aneurysm, without rupture, unspecified: Secondary | ICD-10-CM | POA: Diagnosis present

## 2021-06-23 DIAGNOSIS — I5033 Acute on chronic diastolic (congestive) heart failure: Secondary | ICD-10-CM | POA: Diagnosis not present

## 2021-06-23 DIAGNOSIS — J9601 Acute respiratory failure with hypoxia: Secondary | ICD-10-CM | POA: Diagnosis present

## 2021-06-23 DIAGNOSIS — E785 Hyperlipidemia, unspecified: Secondary | ICD-10-CM | POA: Diagnosis present

## 2021-06-23 DIAGNOSIS — Z90710 Acquired absence of both cervix and uterus: Secondary | ICD-10-CM | POA: Diagnosis not present

## 2021-06-23 DIAGNOSIS — Z7901 Long term (current) use of anticoagulants: Secondary | ICD-10-CM | POA: Diagnosis not present

## 2021-06-23 DIAGNOSIS — Z853 Personal history of malignant neoplasm of breast: Secondary | ICD-10-CM | POA: Diagnosis not present

## 2021-06-23 DIAGNOSIS — I5043 Acute on chronic combined systolic (congestive) and diastolic (congestive) heart failure: Secondary | ICD-10-CM | POA: Diagnosis present

## 2021-06-23 LAB — ECHOCARDIOGRAM COMPLETE
AR max vel: 1.82 cm2
AV Peak grad: 12.4 mmHg
Ao pk vel: 1.76 m/s
Area-P 1/2: 6.71 cm2
MV M vel: 4.93 m/s
MV Peak grad: 97.2 mmHg
S' Lateral: 2.8 cm

## 2021-06-23 LAB — CBC
HCT: 35.6 % — ABNORMAL LOW (ref 36.0–46.0)
Hemoglobin: 11.1 g/dL — ABNORMAL LOW (ref 12.0–15.0)
MCH: 30.2 pg (ref 26.0–34.0)
MCHC: 31.2 g/dL (ref 30.0–36.0)
MCV: 96.7 fL (ref 80.0–100.0)
Platelets: 218 10*3/uL (ref 150–400)
RBC: 3.68 MIL/uL — ABNORMAL LOW (ref 3.87–5.11)
RDW: 14.6 % (ref 11.5–15.5)
WBC: 5.8 10*3/uL (ref 4.0–10.5)
nRBC: 0 % (ref 0.0–0.2)

## 2021-06-23 LAB — BASIC METABOLIC PANEL
Anion gap: 9 (ref 5–15)
BUN: 12 mg/dL (ref 8–23)
CO2: 29 mmol/L (ref 22–32)
Calcium: 8.6 mg/dL — ABNORMAL LOW (ref 8.9–10.3)
Chloride: 100 mmol/L (ref 98–111)
Creatinine, Ser: 0.75 mg/dL (ref 0.44–1.00)
GFR, Estimated: >60 mL/min (ref >60)
Glucose, Bld: 207 mg/dL — ABNORMAL HIGH (ref 70–99)
Potassium: 3.6 mmol/L (ref 3.5–5.1)
Sodium: 138 mmol/L (ref 135–145)

## 2021-06-23 LAB — BRAIN NATRIURETIC PEPTIDE: B Natriuretic Peptide: 450.2 pg/mL — ABNORMAL HIGH (ref 0.0–100.0)

## 2021-06-23 LAB — CBG MONITORING, ED
Glucose-Capillary: 168 mg/dL — ABNORMAL HIGH (ref 70–99)
Glucose-Capillary: 154 mg/dL — ABNORMAL HIGH (ref 70–99)
Glucose-Capillary: 116 mg/dL — ABNORMAL HIGH (ref 70–99)
Glucose-Capillary: 151 mg/dL — ABNORMAL HIGH (ref 70–99)

## 2021-06-23 MED ORDER — DILTIAZEM HCL-DEXTROSE 125-5 MG/125ML-% IV SOLN (PREMIX)
5.0000 mg/h | INTRAVENOUS | Status: DC
  Administered 2021-06-24: 10 mg/h via INTRAVENOUS
  Administered 2021-06-24: 5 mg/h via INTRAVENOUS
  Filled 2021-06-23: qty 125

## 2021-06-23 MED ORDER — DILTIAZEM LOAD VIA INFUSION
10.0000 mg | Freq: Once | INTRAVENOUS | Status: AC
Start: 2021-06-23 — End: 2021-06-23
  Administered 2021-06-23: 10 mg via INTRAVENOUS
  Filled 2021-06-23: qty 10

## 2021-06-23 NOTE — Progress Notes (Addendum)
Heart Failure Navigator Progress Note  Following to assess for Heart & Vascular TOC clinic readiness. No plans for HV TOC clinic follow up discharge d/t significant TR and age.   ECHO: 55-60%, G2DD, mod-severe TR.    Pricilla Holm, MSN, RN Heart Failure Nurse Navigator [phone]

## 2021-06-23 NOTE — ED Notes (Signed)
Breakfast orders placed 

## 2021-06-23 NOTE — ED Notes (Addendum)
MD Liberti Appleton notified that HR has been bouncing around - have seen 97 up to 145 - pt is reporting no cp and no sob at this time Cardizem drip ordered - however pt states this much fluctuation is normal for her, her cardiologist is aware, and that she has seen her HR as high as 170 before - per MD Keajah Killough try the bolus for right now    HR improved since IV bolus of cardizem.   Infusion not required at this time. Continue po cardizem, digoxin and metoprolol in am as takes at home

## 2021-06-23 NOTE — Progress Notes (Signed)
Echocardiogram 2D Echocardiogram has been performed.  Tara Bradley 06/23/2021, 9:20 AM

## 2021-06-23 NOTE — ED Notes (Signed)
Patient has a pur-wick with chuxs under  her

## 2021-06-23 NOTE — ED Notes (Signed)
Pt sitting on side of bed eating lunch.  NAD noted.

## 2021-06-23 NOTE — ED Notes (Signed)
Patient's purewick adjusted and bed linens changed.  Patient also given a box of tissues per request.

## 2021-06-23 NOTE — ED Notes (Signed)
Pt complaining of cramps in her legs, pt stood at edge of bed with assistance - pt placed back in bed and heat packs given

## 2021-06-23 NOTE — Progress Notes (Signed)
HPI: Tara Bradley is a 88 y.o. female with medical history significant of thoracic aortic aneurysm, syncope, atrial fibrillation, hypertension, hyperlipidemia, diabetes, diverticulosis, CHF, cancer, anxiety, insomnia presenting with ongoing shortness of breath.  Patient states that she has had a couple weeks of shortness of breath.  She states that she is also had some increased lower extremity edema.  And she has had 1 to 2 weeks of increased cough congestion and fatigue.  She recently had her beta-blocker increased at her cardiologist visit from 25 to 50 mg twice daily.  She also had her Lasix increased from 20 to 40 mg daily with some improvement in her edema.  She also reports having a red eye.  She typically lives independently but does have family visiting from [place] at this time.  She states that she went to [facility] after-hours clinic to be evaluated and was sent to the ED for further evaluation due to hypoxia.  She denies fevers, chills, chest pain, abdominal pain, constipation, diarrhea, nausea, vomiting.  ED Course: Vital signs in the ED significant for heart rate in the 80s to 130s, respiratory rate in the 20s to 30s, initially saturating 87% on room air with improvement on 2 L.  Lab work-up showed CMP with glucose 334, protein 6.1, albumin 3.4.  CBC with hemoglobin stable 11.8.  Troponin normal with repeat pending.  Digoxin level low at 0.5.  BNP pending.  Respiratory panel flu COVID-negative.  Chest x-ray showed interstitial prominence with patchy disease at the left base consistent with atelectasis versus infiltrate.  Patient received Tylenol, ceftriaxone, azithromycin, Lasix, metoprolol, metformin, Xarelto, Polytrim in the ED.  Subjective Feels some better with Lasix but not back to normal  Physical Exam: Vitals:   06/23/21 0800 06/23/21 0815 06/23/21 0900 06/23/21 0915  BP: (!) 147/92 (!) 145/104 124/77 132/90  Pulse: 75 98 72 79  Resp: (!) 22 (!) 25 (!) 21 (!) 26  Temp:       TempSrc:      SpO2: 97% 97% 95% 96%    Physical Exam Constitutional:      General: She is not in acute distress.    Appearance: Normal appearance.  HENT:     Head: Normocephalic and atraumatic.     Mouth/Throat:     Mouth: Mucous membranes are moist.     Pharynx: Oropharynx is clear.  Eyes:     Extraocular Movements: Extraocular movements intact.     Pupils: Pupils are equal, round, and reactive to light.     Comments: Conjunctival injection  Cardiovascular:     Rate and Rhythm: Normal rate. Rhythm irregular.     Pulses: Normal pulses.     Heart sounds: Normal heart sounds.  Pulmonary:     Effort: Pulmonary effort is normal. No respiratory distress.     Breath sounds: Normal breath sounds.  Abdominal:     General: Bowel sounds are normal. There is no distension.     Palpations: Abdomen is soft.     Tenderness: There is no abdominal tenderness.  Musculoskeletal:        General: No swelling or deformity.     Right lower leg: Edema present.     Left lower leg: Edema present.  Skin:    General: Skin is warm and dry.  Neurological:     General: No focal deficit present.     Mental Status: Mental status is at baseline.   Labs on Admission: I have personally reviewed following labs and imaging studies  CBC: Recent Labs  Lab 06/22/21 1950 06/23/21 0306  WBC 7.6 5.8  NEUTROABS 6.3  --   HGB 11.8* 11.1*  HCT 37.7 35.6*  MCV 96.4 96.7  PLT 247 218     Basic Metabolic Panel: Recent Labs  Lab 06/22/21 1950 06/22/21 2240 06/23/21 0306  NA 135  --  138  K 4.0  --  3.6  CL 98  --  100  CO2 29  --  29  GLUCOSE 334*  --  207*  BUN 13  --  12  CREATININE 0.85  --  0.75  CALCIUM 8.9  --  8.6*  MG  --  1.7  --      GFR: CrCl cannot be calculated (Unknown ideal weight.).  Liver Function Tests: Recent Labs  Lab 06/22/21 1950  AST 19  ALT 11  ALKPHOS 122  BILITOT 0.6  PROT 6.1*  ALBUMIN 3.4*     Urine analysis:    Component Value Date/Time    COLORURINE YELLOW 05/03/2021 0645   APPEARANCEUR CLEAR 05/03/2021 0645   LABSPEC 1.021 05/03/2021 0645   PHURINE 6.0 05/03/2021 0645   GLUCOSEU 150 (A) 05/03/2021 0645   HGBUR NEGATIVE 05/03/2021 0645   BILIRUBINUR NEGATIVE 05/03/2021 0645   BILIRUBINUR n 07/23/2013 1343   KETONESUR 5 (A) 05/03/2021 0645   PROTEINUR NEGATIVE 05/03/2021 0645   UROBILINOGEN 0.2 07/23/2013 1343   UROBILINOGEN 0.2 03/07/2012 2123   NITRITE NEGATIVE 05/03/2021 0645   LEUKOCYTESUR NEGATIVE 05/03/2021 0645    Radiological Exams on Admission: DG Chest 2 View  Result Date: 06/22/2021 CLINICAL DATA:  Shortness of breath. EXAM: CHEST - 2 VIEW COMPARISON:  05/06/2021. FINDINGS: The heart size and mediastinal contours are stable. Atherosclerotic calcification of the aorta is noted. Interstitial prominence is present bilaterally. There is mild airspace disease at the left lung base. No effusion or pneumothorax. Shoulder arthroplasty changes are noted on the right. No acute osseous abnormality. IMPRESSION: Interstitial prominence bilaterally with patchy airspace disease at the left lung base, possible atelectasis or infiltrate. Electronically Signed   By: Brett Fairy M.D.   On: 06/22/2021 20:22    EKG: Independently reviewed.  Fibrillation at 105 bpm.  Some baseline artifact versus fibrillation.  Nonspecific T wave changes.  Assessment/Plan Principal Problem:   CHF exacerbation (HCC) Active Problems:   DM type 2, controlled, with complication ([place])   Persistent atrial fibrillation (HCC)   Hypertension   Acute respiratory failure with hypoxia (HCC)   Conjunctivitis   CHF exacerbation Respiratory failure with hypoxia ?  Viral pneumonia > Patient presenting with ongoing shortness of breath that has been progressive with worsening edema as well partially responsive to increased dose of Lasix. > Also with increased cough, congestion, fatigue for the past week or 2. > Chest x-ray showing interstitial prominence  possible left base infiltrate versus atelectasis.  No leukocytosis.  Dig level low at 0.5.  BNP 450 > Mentation consistent with CHF exacerbation with possible viral pneumonia is no leukocytosis to indicate bacterial pneumonia.  Does have family visiting from [place] who has a young child and she does have a conjunctivitis just once or viral. - Monitor on telemetry - Continue with Lasix 40 mg twice daily IV - Strict I's and O's, daily weights - Echocardiogram pending - Trend renal function electrolytes - magnesium 1.7 - Check RVP - Trend fever curve and white count -On 4 L nasal cannula wean as tolerates  Conjunctivitis - Continue with Polytrim  Atrial fibrillation > Has history of  atrial fibrillation which has been historically difficult to control.  Did have episode of RVR in the ED which resolved when she got her home dose of metoprolol. - Continue home digoxin, diltiazem, metoprolol - Continue home Xarelto  Hypertension - Continue home diltiazem, metoprolol - Lasix as above  Diabetes - SSI  DVT prophylaxis: Xarelto Code Status:   Full Family Communication:  None on admission.  Patient states her family knows she is here. Disposition Plan:   Patient is from:  Home  Anticipated DC to:  Home  Anticipated DC date:  1-3 days  Anticipated DC barriers: None  Consults called:  None Admission status:  Observation, telemetry   Aydenn Gervin A MD

## 2021-06-24 LAB — BASIC METABOLIC PANEL
Anion gap: 10 (ref 5–15)
BUN: 14 mg/dL (ref 8–23)
CO2: 32 mmol/L (ref 22–32)
Calcium: 8.9 mg/dL (ref 8.9–10.3)
Chloride: 93 mmol/L — ABNORMAL LOW (ref 98–111)
Creatinine, Ser: 0.8 mg/dL (ref 0.44–1.00)
GFR, Estimated: >60 mL/min (ref >60)
Glucose, Bld: 163 mg/dL — ABNORMAL HIGH (ref 70–99)
Potassium: 3.9 mmol/L (ref 3.5–5.1)
Sodium: 135 mmol/L (ref 135–145)

## 2021-06-24 LAB — CBC WITH DIFFERENTIAL/PLATELET
Abs Immature Granulocytes: 0.04 10*3/uL (ref 0.00–0.07)
Basophils Absolute: 0 10*3/uL (ref 0.0–0.1)
Basophils Relative: 1 %
Eosinophils Absolute: 0.1 10*3/uL (ref 0.0–0.5)
Eosinophils Relative: 1 %
HCT: 36.5 % (ref 36.0–46.0)
Hemoglobin: 12 g/dL (ref 12.0–15.0)
Immature Granulocytes: 1 %
Lymphocytes Relative: 10 %
Lymphs Abs: 0.7 10*3/uL (ref 0.7–4.0)
MCH: 30.7 pg (ref 26.0–34.0)
MCHC: 32.9 g/dL (ref 30.0–36.0)
MCV: 93.4 fL (ref 80.0–100.0)
Monocytes Absolute: 0.9 10*3/uL (ref 0.1–1.0)
Monocytes Relative: 13 %
Neutro Abs: 5.5 10*3/uL (ref 1.7–7.7)
Neutrophils Relative %: 74 %
Platelets: 259 10*3/uL (ref 150–400)
RBC: 3.91 MIL/uL (ref 3.87–5.11)
RDW: 14.6 % (ref 11.5–15.5)
WBC: 7.3 10*3/uL (ref 4.0–10.5)
nRBC: 0 % (ref 0.0–0.2)

## 2021-06-24 LAB — RESPIRATORY PANEL BY PCR

## 2021-06-24 LAB — GLUCOSE, CAPILLARY
Glucose-Capillary: 173 mg/dL — ABNORMAL HIGH (ref 70–99)
Glucose-Capillary: 211 mg/dL — ABNORMAL HIGH (ref 70–99)

## 2021-06-24 LAB — CBG MONITORING, ED
Glucose-Capillary: 188 mg/dL — ABNORMAL HIGH (ref 70–99)
Glucose-Capillary: 240 mg/dL — ABNORMAL HIGH (ref 70–99)

## 2021-06-24 MED ORDER — LORAZEPAM 2 MG/ML IJ SOLN
0.5000 mg | Freq: Once | INTRAMUSCULAR | Status: AC
  Administered 2021-06-24: 0.5 mg via INTRAVENOUS
  Filled 2021-06-24: qty 1

## 2021-06-24 MED ORDER — DILTIAZEM HCL ER COATED BEADS 180 MG PO CP24
360.0000 mg | ORAL_CAPSULE | Freq: Every day | ORAL | Status: DC
  Administered 2021-06-24: 360 mg via ORAL
  Filled 2021-06-24 (×3): qty 1

## 2021-06-24 NOTE — TOC Progression Note (Addendum)
Transition of Care [facility]) - Progression Note    Patient Details  Name: IMAJEAN MCDERMID MRN: [id number] Date of Birth: Jul 22, 1922  [facility]) CM/SW Contact  Zenon Mayo, RN Phone Number: 06/24/2021, 3:18 PM  Clinical Narrative:     Transition of Care [facility]) Screening Note   Patient Details  Name: MAKAYLYNN BONILLAS Date of Birth: 07/16/1922   Transition [facility]) CM/SW Contact:    Zenon Mayo, RN Phone Number: 06/24/2021, 3:18 PM    Patient is from home, CHF Ex, TOC  will continue to monitor patient advancement through interdisciplinary progression rounds. If new patient transition needs arise, please place a TOC consult.          Expected Discharge Plan and Services                                                 Social Determinants of Health (SDOH) Interventions    Readmission Risk Interventions Readmission Risk Prevention Plan 05/11/2021  Transportation Screening Complete  Home Care Screening Complete  Some recent data might be hidden

## 2021-06-24 NOTE — ED Notes (Signed)
This RN verified with patient placement that pt level of care can remain cardiac tele even on the cardizem gtt.

## 2021-06-24 NOTE — ED Notes (Signed)
MD Chotiner notified that pts heart rate is fluctuating again - MD Chotiner advised to start cardizem gtt -

## 2021-06-24 NOTE — ED Notes (Signed)
Pt reports that she is feeling anxious now - MD notified - ativan orders put in

## 2021-06-24 NOTE — ED Notes (Addendum)
MD Chotiner updated on pt status - rates high 90s-120s maxed on Cardizem -  Per MD cont to monitor patient and have cardiology see her in morning and determine if need to make that change with her history of fluctuating HR she states

## 2021-06-24 NOTE — Progress Notes (Signed)
HPI: Tara Bradley is a 88 y.o. female with medical history significant of thoracic aortic aneurysm, syncope, atrial fibrillation, hypertension, hyperlipidemia, diabetes, diverticulosis, CHF, cancer, anxiety, insomnia presenting with ongoing shortness of breath.  Patient states that she has had a couple weeks of shortness of breath.  She states that she is also had some increased lower extremity edema.  And she has had 1 to 2 weeks of increased cough congestion and fatigue.  She recently had her beta-blocker increased at her cardiologist visit from 25 to 50 mg twice daily.  She also had her Lasix increased from 20 to 40 mg daily with some improvement in her edema.  She also reports having a red eye.  She typically lives independently but does have family visiting from [place] at this time.  She states that she went to [facility] after-hours clinic to be evaluated and was sent to the ED for further evaluation due to hypoxia.  She denies fevers, chills, chest pain, abdominal pain, constipation, diarrhea, nausea, vomiting.  ED Course: Vital signs in the ED significant for heart rate in the 80s to 130s, respiratory rate in the 20s to 30s, initially saturating 87% on room air with improvement on 2 L.  Lab work-up showed CMP with glucose 334, protein 6.1, albumin 3.4.  CBC with hemoglobin stable 11.8.  Troponin normal with repeat pending.  Digoxin level low at 0.5.  BNP pending.  Respiratory panel flu COVID-negative.  Chest x-ray showed interstitial prominence with patchy disease at the left base consistent with atelectasis versus infiltrate.  Patient received Tylenol, ceftriaxone, azithromycin, Lasix, metoprolol, metformin, Xarelto, Polytrim in the ED.  Subjective Continues to improve  Physical Exam: Vitals:   06/24/21 0830 06/24/21 0900 06/24/21 1000 06/24/21 1045  BP: 130/72 (!) 139/100 129/70   Pulse: (!) 121 (!) 149 (!) 144 82  Resp: (!) 23 (!) 28 (!) 28 (!) 24  Temp:      TempSrc:      SpO2: 98%  94% 98% 99%    Physical Exam Constitutional:      General: She is not in acute distress.    Appearance: Normal appearance.  HENT:     Head: Normocephalic and atraumatic.     Mouth/Throat:     Mouth: Mucous membranes are moist.     Pharynx: Oropharynx is clear.  Eyes:     Extraocular Movements: Extraocular movements intact.     Pupils: Pupils are equal, round, and reactive to light.     Comments: Conjunctival injection  Cardiovascular:     Rate and Rhythm: Normal rate. Rhythm irregular.     Pulses: Normal pulses.     Heart sounds: Normal heart sounds.  Pulmonary:     Effort: Pulmonary effort is normal. No respiratory distress.     Breath sounds: Normal breath sounds.  Abdominal:     General: Bowel sounds are normal. There is no distension.     Palpations: Abdomen is soft.     Tenderness: There is no abdominal tenderness.  Musculoskeletal:        General: No swelling or deformity.     Right lower leg: Edema present.     Left lower leg: Edema present.  Skin:    General: Skin is warm and dry.  Neurological:     General: No focal deficit present.     Mental Status: Mental status is at baseline.   Labs on Admission: I have personally reviewed following labs and imaging studies  CBC: Recent Labs  Lab  06/22/21 1950 06/23/21 0306  WBC 7.6 5.8  NEUTROABS 6.3  --   HGB 11.8* 11.1*  HCT 37.7 35.6*  MCV 96.4 96.7  PLT 247 218     Basic Metabolic Panel: Recent Labs  Lab 06/22/21 1950 06/22/21 2240 06/23/21 0306  NA 135  --  138  K 4.0  --  3.6  CL 98  --  100  CO2 29  --  29  GLUCOSE 334*  --  207*  BUN 13  --  12  CREATININE 0.85  --  0.75  CALCIUM 8.9  --  8.6*  MG  --  1.7  --      GFR: CrCl cannot be calculated (Unknown ideal weight.).  Liver Function Tests: Recent Labs  Lab 06/22/21 1950  AST 19  ALT 11  ALKPHOS 122  BILITOT 0.6  PROT 6.1*  ALBUMIN 3.4*     Urine analysis:    Component Value Date/Time   COLORURINE YELLOW 05/03/2021 0645    APPEARANCEUR CLEAR 05/03/2021 0645   LABSPEC 1.021 05/03/2021 0645   PHURINE 6.0 05/03/2021 0645   GLUCOSEU 150 (A) 05/03/2021 0645   HGBUR NEGATIVE 05/03/2021 0645   BILIRUBINUR NEGATIVE 05/03/2021 0645   BILIRUBINUR n 07/23/2013 1343   KETONESUR 5 (A) 05/03/2021 0645   PROTEINUR NEGATIVE 05/03/2021 0645   UROBILINOGEN 0.2 07/23/2013 1343   UROBILINOGEN 0.2 03/07/2012 2123   NITRITE NEGATIVE 05/03/2021 0645   LEUKOCYTESUR NEGATIVE 05/03/2021 0645    Radiological Exams on Admission: DG Chest 2 View  Result Date: 06/22/2021 CLINICAL DATA:  Shortness of breath. EXAM: CHEST - 2 VIEW COMPARISON:  05/06/2021. FINDINGS: The heart size and mediastinal contours are stable. Atherosclerotic calcification of the aorta is noted. Interstitial prominence is present bilaterally. There is mild airspace disease at the left lung base. No effusion or pneumothorax. Shoulder arthroplasty changes are noted on the right. No acute osseous abnormality. IMPRESSION: Interstitial prominence bilaterally with patchy airspace disease at the left lung base, possible atelectasis or infiltrate. Electronically Signed   By: Brett Fairy M.D.   On: 06/22/2021 20:22   ECHOCARDIOGRAM COMPLETE  Result Date: 06/23/2021    ECHOCARDIOGRAM REPORT   Patient Name:   SHANTERRIA FRANTA Date of Exam: 06/23/2021 Medical Rec #:  [id number]        Height:       65.0 in Accession #:    [id number]       Weight:       143.6 lb Date of Birth:  11-21-1922         BSA:          1.718 m Patient Age:    52 years         BP:           145/104 mmHg Patient Gender: F                HR:           64 bpm. Exam Location:  Inpatient Procedure: 2D Echo Indications:    Dyspnea  History:        Patient has prior history of Echocardiogram examinations, most                 recent 10/16/2020. CHF, Arrythmias:Atrial Fibrillation; Risk                 Factors:Hypertension and Diabetes.  Sonographer:    Jefferey Pica Referring Phys: [id number] [place]  1. Left ventricular ejection  fraction, by estimation, is 55 to 60%. The left ventricle has normal function. The left ventricle has no regional wall motion abnormalities. Left ventricular diastolic parameters are consistent with Grade II diastolic dysfunction (pseudonormalization).  2. Right ventricular systolic function is normal. The right ventricular size is normal. There is moderately elevated pulmonary artery systolic pressure. The estimated right ventricular systolic pressure is 84.6 mmHg.  3. Left atrial size was moderately dilated.  4. Right atrial size was moderately dilated.  5. The mitral valve is normal in structure. Mild mitral valve regurgitation. No evidence of mitral stenosis.  6. Tricuspid valve regurgitation is moderate to severe.  7. The aortic valve is tricuspid. There is mild calcification of the aortic valve. Aortic valve regurgitation is not visualized. Aortic valve sclerosis is present, with no evidence of aortic valve stenosis.  8. The inferior vena cava is normal in size with <50% respiratory variability, suggesting right atrial pressure of 8 mmHg. Comparison(s): No significant change from prior study. Prior images reviewed side by side. FINDINGS  Left Ventricle: Left ventricular ejection fraction, by estimation, is 55 to 60%. The left ventricle has normal function. The left ventricle has no regional wall motion abnormalities. The left ventricular internal cavity size was normal in size. There is  no left ventricular hypertrophy. Left ventricular diastolic parameters are consistent with Grade II diastolic dysfunction (pseudonormalization). Right Ventricle: The right ventricular size is normal. No increase in right ventricular wall thickness. Right ventricular systolic function is normal. There is moderately elevated pulmonary artery systolic pressure. The tricuspid regurgitant velocity is 3.32 m/s, and with an assumed right atrial pressure of 8 mmHg, the estimated right  ventricular systolic pressure is 96.2 mmHg. Left Atrium: Left atrial size was moderately dilated. Right Atrium: Right atrial size was moderately dilated. Pericardium: There is no evidence of pericardial effusion. Mitral Valve: The mitral valve is normal in structure. Mild mitral annular calcification. Mild mitral valve regurgitation. No evidence of mitral valve stenosis. Tricuspid Valve: The tricuspid valve is normal in structure. Tricuspid valve regurgitation is moderate to severe. No evidence of tricuspid stenosis. Aortic Valve: The aortic valve is tricuspid. There is mild calcification of the aortic valve. Aortic valve regurgitation is not visualized. Aortic valve sclerosis is present, with no evidence of aortic valve stenosis. Aortic valve peak gradient measures 12.4 mmHg. Pulmonic Valve: The pulmonic valve was normal in structure. Pulmonic valve regurgitation is not visualized. No evidence of pulmonic stenosis. Aorta: The aortic root is normal in size and structure. Venous: The inferior vena cava is normal in size with less than 50% respiratory variability, suggesting right atrial pressure of 8 mmHg. IAS/Shunts: No atrial level shunt detected by color flow Doppler.  LEFT VENTRICLE PLAX 2D LVIDd:         4.00 cm   Diastology LVIDs:         2.80 cm   LV e' medial:    6.60 cm/s LV PW:         1.00 cm   LV E/e' medial:  19.1 LV IVS:        0.90 cm   LV e' lateral:   7.15 cm/s LVOT diam:     1.80 cm   LV E/e' lateral: 17.6 LV SV:         53 LV SV Index:   31 LVOT Area:     2.54 cm  RIGHT VENTRICLE            IVC RV Basal diam:  3.00 cm  IVC diam: 2.00 cm RV Mid diam:    3.20 cm RV S prime:     8.45 cm/s TAPSE (M-mode): 2.0 cm LEFT ATRIUM           Index        RIGHT ATRIUM           Index LA diam:      4.40 cm 2.56 cm/m   RA Area:     20.30 cm LA Vol (A2C): 71.4 ml 41.55 ml/m  RA Volume:   59.00 ml  34.34 ml/m LA Vol (A4C): 83.8 ml 48.77 ml/m  AORTIC VALVE                 PULMONIC VALVE AV Area (Vmax): 1.82  cm     PV Vmax:       0.80 m/s AV Vmax:        176.00 cm/s  PV Peak grad:  2.5 mmHg AV Peak Grad:   12.4 mmHg LVOT Vmax:      126.00 cm/s LVOT Vmean:     74.800 cm/s LVOT VTI:       0.209 m  AORTA Ao Root diam: 3.10 cm Ao Asc diam:  3.60 cm MITRAL VALVE                TRICUSPID VALVE MV Area (PHT): 6.71 cm     TR Peak grad:   44.1 mmHg MV Decel Time: 113 msec     TR Vmax:        332.00 cm/s MR Peak grad: 97.2 mmHg MR Vmax:      493.00 cm/s   SHUNTS MV E velocity: 126.00 cm/s  Systemic VTI:  0.21 m                             Systemic Diam: 1.80 cm Candee Furbish MD Electronically signed by Candee Furbish MD Signature Date/Time: 06/23/2021/11:03:18 AM    Final     EKG: Independently reviewed.  Fibrillation at 105 bpm.  Some baseline artifact versus fibrillation.  Nonspecific T wave changes.  Assessment/Plan Principal Problem:   CHF exacerbation (HCC) Active Problems:   DM type 2, controlled, with complication ([place])   Persistent atrial fibrillation (HCC)   Hypertension   Acute respiratory failure with hypoxia (HCC)   Conjunctivitis   CHF exacerbation Respiratory failure with hypoxia ?  Viral pneumonia > Patient presenting with ongoing shortness of breath that has been progressive with worsening edema as well partially responsive to increased dose of Lasix. > Also with increased cough, congestion, fatigue for the past week or 2. > Chest x-ray showing interstitial prominence possible left base infiltrate versus atelectasis.  No leukocytosis.  Dig level low at 0.5.  BNP 450 > Mentation consistent with CHF exacerbation with possible viral pneumonia is no leukocytosis to indicate bacterial pneumonia.  Does have family visiting from [place] who has a young child and she does have a conjunctivitis just once or viral. - Monitor on telemetry - Continue with Lasix 40 mg twice daily IV - Strict I's and O's, daily weights - Echocardiogram EF 60% - Trend renal function electrolytes - magnesium 1.7 - Check  RVP - Trend fever curve and white count -On 2 L nasal cannula wean as tolerates, not on home oxygen requirements  Conjunctivitis - Continue with Polytrim  Atrial fibrillation > Has history of atrial fibrillation which has been historically difficult to control.  Did have episode of RVR  in the ED which resolved when she got her home dose of metoprolol. - Continue home digoxin, diltiazem, metoprolol - Continue home Xarelto -Started on Cardizem drip last night rate currently in the 80s.  Repeat twelve-lead EKG.  Hypertension - Continue home diltiazem, metoprolol - Lasix as above  Diabetes - SSI   Obtain physical therapy consult.  Deagen Krass A MD

## 2021-06-24 NOTE — ED Notes (Signed)
Pt placed on hospital bed for comfort.

## 2021-06-24 NOTE — ED Notes (Signed)
This RN noticed that pt was desatting on the monitor - this RN found pt to have slid to the edge of the bed, oxygen off, and trying to change her brief. This RN educated pt on importance of using the call light since we are monitoring her HR, she has O2 on, and an IV drip. Pt states that she can do it herself. This RN reinstanted points of education. Pt placed back on O2, brief changed, and pt on monitor. Call light remains within reach.

## 2021-06-25 DIAGNOSIS — I5033 Acute on chronic diastolic (congestive) heart failure: Secondary | ICD-10-CM

## 2021-06-25 DIAGNOSIS — I4819 Other persistent atrial fibrillation: Secondary | ICD-10-CM

## 2021-06-25 DIAGNOSIS — J189 Pneumonia, unspecified organism: Secondary | ICD-10-CM

## 2021-06-25 LAB — BASIC METABOLIC PANEL
Anion gap: 10 (ref 5–15)
BUN: 14 mg/dL (ref 8–23)
CO2: 32 mmol/L (ref 22–32)
Calcium: 8.6 mg/dL — ABNORMAL LOW (ref 8.9–10.3)
Chloride: 95 mmol/L — ABNORMAL LOW (ref 98–111)
Creatinine, Ser: 0.84 mg/dL (ref 0.44–1.00)
GFR, Estimated: >60 mL/min (ref >60)
Glucose, Bld: 166 mg/dL — ABNORMAL HIGH (ref 70–99)
Potassium: 3.6 mmol/L (ref 3.5–5.1)
Sodium: 137 mmol/L (ref 135–145)

## 2021-06-25 LAB — CBC WITH DIFFERENTIAL/PLATELET
Abs Immature Granulocytes: 0.03 10*3/uL (ref 0.00–0.07)
Basophils Absolute: 0.1 10*3/uL (ref 0.0–0.1)
Basophils Relative: 1 %
Eosinophils Absolute: 0.1 10*3/uL (ref 0.0–0.5)
Eosinophils Relative: 2 %
HCT: 37.2 % (ref 36.0–46.0)
Hemoglobin: 11.8 g/dL — ABNORMAL LOW (ref 12.0–15.0)
Immature Granulocytes: 1 %
Lymphocytes Relative: 18 %
Lymphs Abs: 1.2 10*3/uL (ref 0.7–4.0)
MCH: 29.6 pg (ref 26.0–34.0)
MCHC: 31.7 g/dL (ref 30.0–36.0)
MCV: 93.5 fL (ref 80.0–100.0)
Monocytes Absolute: 0.9 10*3/uL (ref 0.1–1.0)
Monocytes Relative: 14 %
Neutro Abs: 4.2 10*3/uL (ref 1.7–7.7)
Neutrophils Relative %: 64 %
Platelets: 225 10*3/uL (ref 150–400)
RBC: 3.98 MIL/uL (ref 3.87–5.11)
RDW: 14.3 % (ref 11.5–15.5)
WBC: 6.5 10*3/uL (ref 4.0–10.5)
nRBC: 0 % (ref 0.0–0.2)

## 2021-06-25 LAB — GLUCOSE, CAPILLARY
Glucose-Capillary: 179 mg/dL — ABNORMAL HIGH (ref 70–99)
Glucose-Capillary: 250 mg/dL — ABNORMAL HIGH (ref 70–99)
Glucose-Capillary: 180 mg/dL — ABNORMAL HIGH (ref 70–99)
Glucose-Capillary: 271 mg/dL — ABNORMAL HIGH (ref 70–99)

## 2021-06-25 MED ORDER — METOPROLOL SUCCINATE ER 100 MG PO TB24
100.0000 mg | ORAL_TABLET | Freq: Two times a day (BID) | ORAL | Status: DC
  Administered 2021-06-25 – 2021-06-26 (×2): 100 mg via ORAL
  Filled 2021-06-25 (×2): qty 1

## 2021-06-25 MED ORDER — FUROSEMIDE 40 MG PO TABS
40.0000 mg | ORAL_TABLET | Freq: Every day | ORAL | Status: DC
  Administered 2021-06-26: 40 mg via ORAL
  Filled 2021-06-25: qty 1

## 2021-06-25 MED ORDER — DILTIAZEM HCL-DEXTROSE 125-5 MG/125ML-% IV SOLN (PREMIX)
5.0000 mg/h | INTRAVENOUS | Status: DC
  Administered 2021-06-25: 15 mg/h via INTRAVENOUS
  Administered 2021-06-25: 5 mg/h via INTRAVENOUS
  Administered 2021-06-26: 15 mg/h via INTRAVENOUS
  Filled 2021-06-25 (×3): qty 125

## 2021-06-25 MED ORDER — DILTIAZEM HCL ER COATED BEADS 180 MG PO CP24
360.0000 mg | ORAL_CAPSULE | Freq: Every day | ORAL | Status: DC
  Administered 2021-06-26: 360 mg via ORAL
  Filled 2021-06-25: qty 2

## 2021-06-25 MED ORDER — DILTIAZEM HCL 25 MG/5ML IV SOLN
10.0000 mg | Freq: Once | INTRAVENOUS | Status: AC
  Administered 2021-06-25: 10 mg via INTRAVENOUS
  Filled 2021-06-25: qty 5

## 2021-06-25 NOTE — Evaluation (Signed)
Physical Therapy Evaluation Patient Details Name: Tara Bradley MRN: [id number] DOB: 1923/03/27 Today's Date: 06/25/2021  History of Present Illness  The pt is a 88 yo female presenting 1/30 with SOB and PNA, placed on 2L O2 due to SpO2 87%. Suspect CHF exacerbation. PMH includes: recent fall with R shoulder fx (12/10), syncope, afib, HTN, HLD, DM II, diverticulosis, CHF, anxiety, and insomnia.   Clinical Impression  Pt in bed upon arrival of PT, agreeable to evaluation at this time. Prior to admission the pt was mobilizing with use of a rollator, independent with ADLs and IADLs, still driving and engaged in community. The pt now presents with limitations in functional mobility, activity tolerance, and dynamic stability due to above dx, and will continue to benefit from skilled PT to address these deficits. The pt was able to complete x3 short bouts of ambulation in the room with both RA and then 2LO2. The pt initially showed desat to low of 86% on RA with short ambulation, but on 2nd attempt maintained SpO2 > 93% on RA with short ambulation. Will continue to assess with mobility progression. Given the pt's current mobility level and availability for family and friends to assist at d/c, recommend return home with follow up therapies to progress endurance and stability in her home environment.   5X Sit-to-Stand: 29 sec (> 14.8 sec indicates increased risk of falls for individuals aged >8, >15 sec indicates increased risk of recurrent falls)        Recommendations for follow up therapy are one component of a multi-disciplinary discharge planning process, led by the attending physician.  Recommendations may be updated based on patient status, additional functional criteria and insurance authorization.  Follow Up Recommendations Home health PT    Assistance Recommended at Discharge PRN  Patient can return home with the following  A little help with walking and/or transfers;Assistance with  cooking/housework;Assist for transportation    Equipment Recommendations None recommended by PT  Recommendations for Other Services       Functional Status Assessment Patient has had a recent decline in their functional status and demonstrates the ability to make significant improvements in function in a reasonable and predictable amount of time.     Precautions / Restrictions Precautions Precautions: Other (comment) Precaution Comments: watch O2 Restrictions Weight Bearing Restrictions: No      Mobility  Bed Mobility Overal bed mobility: Needs Assistance Bed Mobility: Supine to Sit     Supine to sit: Supervision     General bed mobility comments: increased time and cues to continue movement    Transfers Overall transfer level: Needs assistance Equipment used: 1 person hand held assist Transfers: Sit to/from Stand Sit to Stand: Min guard           General transfer comment: minG for safety, pt reaching for UE support. able to complete 5x sit-stand in 29 sec with LUE support. unable to stand without UE support    Ambulation/Gait Ambulation/Gait assistance: Min guard Gait Distance (Feet): 25 Feet (x3) Assistive device: IV Pole Gait Pattern/deviations: Step-through pattern, Decreased stride length Gait velocity: decreased Gait velocity interpretation: <1.31 ft/sec, indicative of household ambulator   General Gait Details: pt completed 3 bouts of gait, one without O2, one with 2L O2, then again without . SpO2 dropped with RA initially, but was maintained on 3rd attempt with pt reporting no SOB     Balance Overall balance assessment: Mild deficits observed, not formally tested  Pertinent Vitals/Pain Pain Assessment Pain Assessment: No/denies pain    Home Living Family/patient expects to be discharged to:: Private residence Living Arrangements: Alone Available Help at Discharge: Friend(s) Type of  Home: Apartment Home Access: Elevator       Home Layout: One level Home Equipment: Shower seat;Toilet riser;BSC/3in1;Rollator (4 wheels);Rolling Walker (2 wheels);Cane - single point;Other (comment);Wheelchair - manual      Prior Function Prior Level of Function : Driving;Independent/Modified Independent             Mobility Comments: Uses rollator. No other falls in past 6 months per pt other than one causing RUE fx ADLs Comments: Cleans, cooks, and performs all ADLs without assistance.     Hand Dominance   Dominant Hand: Right    Extremity/Trunk Assessment   Upper Extremity Assessment Upper Extremity Assessment: RUE deficits/detail RUE Deficits / Details: pt reports limited by MD after fx and surgery, MD encouraging only gentle ROM exercises at this time, no need for sling    Lower Extremity Assessment Lower Extremity Assessment: Generalized weakness    Cervical / Trunk Assessment Cervical / Trunk Assessment: Kyphotic  Communication   Communication: No difficulties  Cognition Arousal/Alertness: Awake/alert Behavior During Therapy: WFL for tasks assessed/performed Overall Cognitive Status: Within Functional Limits for tasks assessed                                 General Comments: pt following all cues, answering all questions appropriately        General Comments General comments (skin integrity, edema, etc.): SpO2 86-93% on RA upon arrival, maintained 86% on RA with gait, improved to 98% on 2LO2 and pt then able to complete additional bout with SpO2 93% on RA    Exercises     Assessment/Plan    PT Assessment Patient needs continued PT services  PT Problem List Decreased range of motion;Decreased activity tolerance;Decreased strength;Decreased balance;Decreased mobility;Cardiopulmonary status limiting activity       PT Treatment Interventions DME instruction;Gait training;Functional mobility training;Therapeutic activities;Therapeutic  exercise;Balance training;Patient/family education    PT Goals (Current goals can be found in the Care Plan section)  Acute Rehab PT Goals Patient Stated Goal: return home PT Goal Formulation: With patient Time For Goal Achievement: 07/09/21 Potential to Achieve Goals: Good    Frequency Min 3X/week        AM-PAC PT "6 Clicks" Mobility  Outcome Measure Help needed turning from your back to your side while in a flat bed without using bedrails?: A Little Help needed moving from lying on your back to sitting on the side of a flat bed without using bedrails?: A Little Help needed moving to and from a bed to a chair (including a wheelchair)?: A Little Help needed standing up from a chair using your arms (e.g., wheelchair or bedside chair)?: A Little Help needed to walk in hospital room?: A Little Help needed climbing 3-5 steps with a railing? : A Little 6 Click Score: 18    End of Session Equipment Utilized During Treatment: Gait belt;Oxygen Activity Tolerance: Patient tolerated treatment well Patient left: in chair;with call bell/phone within reach Nurse Communication: Mobility status PT Visit Diagnosis: Other abnormalities of gait and mobility (R26.89);Muscle weakness (generalized) (M62.81);Difficulty in walking, not elsewhere classified (R26.2)    Time: 4431-5400 PT Time Calculation (min) (ACUTE ONLY): 32 min   Charges:   PT Evaluation $PT Eval Low Complexity: 1 Low PT Treatments $Therapeutic Exercise: 8-22  mins        West Carbo, PT, DPT   Acute Rehabilitation Department Pager #: [phone]   Sandra Cockayne 06/25/2021, 1:47 PM

## 2021-06-25 NOTE — Progress Notes (Signed)
Mobility Specialist Progress Note:   06/25/21 1520  Mobility  Activity Ambulated with assistance in hallway  Level of Assistance Standby assist, set-up cues, supervision of patient - no hands on  Assistive Device Front wheel walker  Distance Ambulated (ft) 520 ft  Activity Response Tolerated well  $Mobility charge 1 Mobility   Pt received in bed willing to participate in mobility. No complaints of pain and asymptomatic. Pt left EOB with call bell in reach and all needs met.   [facility] Public librarian Phone [phone] Secondary Phone [phone]

## 2021-06-25 NOTE — TOC Progression Note (Addendum)
Transition of Care [facility]) - Progression Note    Patient Details  Name: Tara Bradley MRN: [id number] Date of Birth: 05-Jul-1922  Transition of Care [facility]) CM/SW Contact  Zenon Mayo, RN Phone Number: 06/25/2021, 4:33 PM  Clinical Narrative:    NCM spoke with patient at the bedside, she states she is very active and indpe. She states she has a rollator and several canes at home, her grand daughter from [place] is with her right now but she will be going back to [place] by next Wed. Patient states she still drives also, she lives at near [facility] and she has friends in her apartment complex that will help her out as well.   NCM offered choice for HHPT, She states she broke her shoulder and her PCP does not want her to do any HH physical therapy right now.  She states she think she is active still with [facility] for HHPT, but she is not going to be able to do any therapy with them.  NCM informed her that will inform [facility] of this information, maybe they can just provide a HHRN to come and CHF disease management with her.  TOC will continue to follow for dc needs.         Expected Discharge Plan and Services                                                 Social Determinants of Health (SDOH) Interventions    Readmission Risk Interventions Readmission Risk Prevention Plan 05/11/2021  Transportation Screening Complete  Home Care Screening Complete  Some recent data might be hidden

## 2021-06-25 NOTE — Consult Note (Addendum)
Cardiology Consultation:   Patient ID: Tara Bradley MRN: [id number]; DOB: 08/29/1922  Admit date: 06/22/2021 Date of Consult: 06/25/2021  PCP:  Tara Jordan, Bradley   [facility] Providers Cardiologist:  Tara Breeding, Bradley     Patient Profile:   Tara Bradley is a 87 y.o. female with a hx of chronic afib on Xarelto, chronic combined systolic and diastolic heart failure, thoracic aortic aneurysm, aortic valve sclerosis, HTN, HLD, who is being seen 06/25/2021 for the evaluation of CHF, afib at the request of Dr. Shanon Bradley.  History of Present Illness:   Tara Bradley is a 88 year old female with above medical history who is followed by Tara Bradley, established care in 2014. Per chart review, patient has a long standing history of atrial fibrillation (developed prior to 2011). Initially was paroxysmal, however later developed to persistent afib. Historically difficult to rate control. Attempted carioversion in 2014, patient maintained sinus rhythm briefly but quickly converted back to afib. CT chest in 2018 showed a descending aortic 4 cm aneuysm. Patient was evaluated by vascular surgery, and patient was no interested in aneurysm repair at that time. Patient was first diagnosed with heart failure in 2018 when an echo showed an LVEF 45% with diffuse hypokinesis. More recent echo completed on 10/16/2020 showed improvement in EF to 60-65%, normal LV/RV function, mildly dilated RA and RA. Patient was most recently seen by cardiology on 05/29/2021 in office. At that appointment, patient's hr was reasonably well controlled. On digozin 0.0625, cardizem cd 31m, metoprolol 527mBID, xarelto.   Patient presented to the ED on 1/30 starting she was diagnosed with PNA at her PCP. Patient had been SOB and had increased swelling in feet for the past week. Patient was admitted to the hospitalist service for treatment of CHF exacerbation, possible PNA. Patient continued to have an elevated heart rate and cardiology  was consulted. Currently on home doses of digoxin 0.0625 mg, metoprolol 50 mg daily. On diltiazem drip.   Pertinent labs include Na 137, K 3.6, creatinine 0.84, hemoglobin 11.8, WBC 6.5. HSTN 12>>11. BNP elevated to 450.2. Negative for covid/flu. CXR in ED showed possible atelectasis or infiltrate. EKG showed afib with rate 104, left axis deviation, inverted T waves in V5, V6. Echo this admission showed EF 55-60%, grade II diastolic dysfunction, moderately dilated LA, RA.    Past Medical History:  Diagnosis Date   Aortic valve sclerosis    Echo, 2008   Arthritis    Bradycardia    October, 2012   [facility]   Chronic insomnia    Closed fracture of unspecified part of femur 2005   Diabetes mellitus, type 2 ([facility]   Diverticulitis    Diverticulosis    Ejection fraction    EF 60%, echo, February, 2008  //   EF 65-70%, echo, November, 2012   Hyperlipidemia    Hypertension    Long term (current) use of anticoagulants    Osteopenia    Osteoporosis    femur fracture 2005, pelvic fracture 2006   Persistent atrial fibrillation (HCC)    Personal history of malignant neoplasm of breast    Syncope    Thoracic aortic aneurysm 02/2017   Unspecified closed fracture of pelvis 2006    Past Surgical History:  Procedure Laterality Date   CARDIOVERSION N/A 08/09/2012   Procedure: CARDIOVERSION;  Surgeon: Tara SprangMD;  [place] [facility] Service: Cardiovascular;  Laterality: N/A;   EYE SURGERY     FEMUR  SURGERY  2005   ORIF   IR ANGIOGRAM PELVIS SELECTIVE OR SUPRASELECTIVE  03/11/2017   IR ANGIOGRAM SELECTIVE EACH ADDITIONAL VESSEL  03/11/2017   IR EMBO ART  VEN HEMORR LYMPH EXTRAV  INC GUIDE ROADMAPPING  03/11/2017   IR FLUORO GUIDE CV LINE RIGHT  03/11/2017   IR US GUIDE VASC ACCESS RIGHT  03/11/2017   IR US GUIDE VASC ACCESS RIGHT  03/11/2017   MASTECTOMY  1995   left   MINOR HARDWARE REMOVAL Left 09/01/2020   Procedure: REMOVAL OF HARDWARE LEFT FOREARM;  Surgeon:  Tara Bradley;  Location: [place];  Service: Orthopedics;  Laterality: Left;   ORIF ULNAR FRACTURE Left 10/19/2019   Procedure: OPEN REDUCTION INTERNAL FIXATION (ORIF)  LEFT DISTAL RADIUS  AND ULNA FRACTURE;  Surgeon: Tara Bradley;  Location: [place];  Service: Orthopedics;  Laterality: Left;   REVERSE SHOULDER ARTHROPLASTY Right 05/04/2021   Procedure: REVERSE SHOULDER ARTHROPLASTY;  Surgeon: Tara Cedars, Bradley;  Location: [place];  Service: Orthopedics;  Laterality: Right;   SHOULDER SURGERY     left   SHOULDER SURGERY  1979, 2003   TONSILLECTOMY     TOTAL ABDOMINAL HYSTERECTOMY W/ BILATERAL SALPINGOOPHORECTOMY  1995   WRIST SURGERY      x 2      Home Medications:  Prior to Admission medications   Medication Sig Start Date End Date Taking? Authorizing Provider  Calcium Carbonate-Vitamin D 600-400 MG-UNIT tablet Take 1 tablet by mouth daily.   Yes Provider, Historical, Bradley  Cholecalciferol (VITAMIN D) 2000 UNITS tablet Take 1,000 Units by mouth daily.   Yes Provider, Historical, Bradley  digoxin (LANOXIN) 0.125 MG tablet TAKE 1/2 TABLET BY MOUTH EVERY DAY 06/22/21  Yes Tara Breeding, Bradley  diltiazem (CARDIZEM CD) 360 MG 24 hr capsule Take 1 capsule (360 mg total) by mouth daily. 05/12/21  Yes Shelly Coss, Bradley  furosemide (LASIX) 20 MG tablet Take 1 tablet (20 mg total) by mouth daily. 05/12/21  Yes Shelly Coss, Bradley  Magnesium Oxide 250 MG TABS Take 250 mg by mouth daily.   Yes Provider, Historical, Bradley  metFORMIN (GLUMETZA) 500 MG (MOD) 24 hr tablet Take 1,000 mg by mouth every evening.   Yes Provider, Historical, Bradley  metoprolol succinate (TOPROL-XL) 25 MG 24 hr tablet Take 1 tablet (25 mg total) by mouth 2 (two) times daily. 06/17/21  Yes Hochrein, Jeneen Rinks, Bradley  multivitamin-lutein [facility]) CAPS capsule Take 1 capsule by mouth daily.   Yes Provider, Historical, Bradley  polyethylene glycol (MIRALAX / GLYCOLAX) 17 g packet Take 17 g by mouth daily. 05/12/21  Yes  Shelly Coss, Bradley  potassium chloride (K-DUR) 10 MEQ tablet Take 10 mEq by mouth daily.  03/25/15  Yes Provider, Historical, Bradley  senna-docusate (SENOKOT-S) 8.6-50 MG tablet Take 1 tablet by mouth 2 (two) times daily. 05/11/21  Yes Adhikari, Tamsen Meek, Bradley  XARELTO 15 MG TABS tablet TAKE 1 TABLET ONCE DAILY WITH SUPPER. Patient taking differently: Take 15 mg by mouth daily with supper. 06/22/21  Yes Tara Breeding, Bradley  BD PEN NEEDLE NANO U/F 32G X 4 MM MISC Inject into the skin daily. 11/25/20   Provider, Historical, Bradley  HUMALOG MIX 50/50 KWIKPEN (50-50) 100 UNIT/ML KwikPen Inject 5 Units into the skin daily. Patient not taking: Reported on 06/23/2021 04/28/21   Provider, Historical, Bradley    Inpatient Medications: Scheduled Meds:  digoxin  0.0625 mg Oral Daily   furosemide  40 mg Intravenous BID   insulin aspart  0-9 Units Subcutaneous TID WC   metoprolol succinate  50 mg Oral BID   Rivaroxaban  15 mg Oral Q supper   sodium chloride flush  3 mL Intravenous Q12H   trimethoprim-polymyxin b  2 drop Right Eye Q4H   Continuous Infusions:  diltiazem (CARDIZEM) infusion 12.5 mg/hr (06/25/21 1152)   PRN Meds: acetaminophen **OR** acetaminophen, polyethylene glycol  Allergies:    Allergies  Allergen Reactions   Ambien [Zolpidem Tartrate]     Hallucinations    Ambien [Zolpidem]     Other reaction(s): hallucinations   Codeine Other (See Comments)    headache Other reaction(s): headache   Codeine Sulfate     REACTION: unspecified   Dulaglutide     Other reaction(s): diarrhea   Latex Hives   Mirtazapine     Other reaction(s): heart palpitations   Morphine Sulfate     Other reaction(s): nausea/vomiting    Social History:   Social History   Socioeconomic History   Marital status: Widowed    Spouse name: Not on file   Number of children: 2   Years of education: Not on file   Highest education level: Not on file  Occupational History   Occupation: English as a second language teacher, retired  Tobacco Use    Smoking status: Never   Smokeless tobacco: Never  Vaping Use   Vaping Use: Never used  Substance and Sexual Activity   Alcohol use: No   Drug use: No   Sexual activity: Not Currently  Other Topics Concern   Not on file  Social History Narrative   Husband had advancing Dementia, which was been very difficult for her. He died January 13, 2017.   She was scheduled to go into [facility] 11/02.   Social Determinants of Health   Financial Resource Strain: Not on file  Food Insecurity: Not on file  Transportation Needs: Not on file  Physical Activity: Not on file  Stress: Not on file  Social Connections: Not on file  Intimate Partner Violence: Not on file    Family History:    Family History  Problem Relation Age of Onset   Congestive Heart Failure Mother 53   Heart attack Father 85   Diabetes type II Father    Diabetes type II Brother        1/2   Multiple myeloma Brother        2/2   Diabetes type II Son    Colon cancer Neg Hx      ROS:  Please see the history of present illness.   All other ROS reviewed and negative.     Physical Exam/Data:   Vitals:   06/25/21 0900 06/25/21 1000 06/25/21 1040 06/25/21 1214  BP: 119/76 131/82 136/77   Pulse: 95 (!) 114 (!) 116 86  Resp:   20   Temp:      TempSrc:   Oral   SpO2: 91% 90% 93% 91%  Weight:        Intake/Output Summary (Last 24 hours) at 06/25/2021 1244 Last data filed at 06/25/2021 0945 Gross per 24 hour  Intake 956 ml  Output 2120 ml  Net -1164 ml   Last 3 Weights 06/25/2021 05/29/2021 05/11/2021  Weight (lbs) 134 lb 9.6 oz 143 lb 9.6 oz 141 lb 11.2 oz  Weight (kg) 61.054 kg 65.137 kg 64.275 kg     Body mass index is 22.4 kg/m.  General:  Well nourished, well developed, in no acute distress  HEENT: normal Neck: no JVD  Vascular: Radial pulses 2+ bilaterally Cardiac:  normal S1, S2; irregular rate and rhythm, no murmur  Lungs:  clear to auscultation bilaterally, no wheezing, rhonchi or rales  Abd:  soft, nontender, no hepatomegaly  Ext: no edema Musculoskeletal:  No deformities, BUE and BLE strength normal and equal Skin: warm and dry  Neuro:  CNs 2-12 intact, no focal abnormalities noted Psych:  Normal affect   EKG:  The EKG was personally reviewed and demonstrates:  Afib with RVR, inverted Twaves in V5,V6  Telemetry:  Telemetry was personally reviewed and demonstrates:  Afiv, heart rate ranges from the 80s-120s. Mostly in 100s-110s  Relevant CV Studies:  Echo 06/23/2021  1. Left ventricular ejection fraction, by estimation, is 55 to 60%. The  left ventricle has normal function. The left ventricle has no regional  wall motion abnormalities. Left ventricular diastolic parameters are  consistent with Grade II diastolic  dysfunction (pseudonormalization).   2. Right ventricular systolic function is normal. The right ventricular  size is normal. There is moderately elevated pulmonary artery systolic  pressure. The estimated right ventricular systolic pressure is 40.0 mmHg.   3. Left atrial size was moderately dilated.   4. Right atrial size was moderately dilated.   5. The mitral valve is normal in structure. Mild mitral valve  regurgitation. No evidence of mitral stenosis.   6. Tricuspid valve regurgitation is moderate to severe.   7. The aortic valve is tricuspid. There is mild calcification of the  aortic valve. Aortic valve regurgitation is not visualized. Aortic valve  sclerosis is present, with no evidence of aortic valve stenosis.   8. The inferior vena cava is normal in size with <50% respiratory  variability, suggesting right atrial pressure of 8 mmHg.   Comparison(s): No significant change from prior study. Prior images  reviewed side by side.   Laboratory Data:  High Sensitivity Troponin:   Recent Labs  Lab 06/22/21 2103 06/22/21 2240  TROPONINIHS 12 11     Chemistry Recent Labs  Lab 06/22/21 2240 06/23/21 0306 06/24/21 1549 06/25/21 0241  NA  --  138  135 137  K  --  3.6 3.9 3.6  CL  --  100 93* 95*  CO2  --  29 32 32  GLUCOSE  --  207* 163* 166*  BUN  --  _0 CREATININE  --  0.75 0.80 0.84  CALCIUM  --  8.6* 8.9 8.6*  MG 1.7  --   --   --   GFRNONAA  --  >60 >60 >60  ANIONGAP  --  _1 Recent Labs  Lab 06/22/21 1950  PROT 6.1*  ALBUMIN 3.4*  AST 19  ALT 11  ALKPHOS 122  BILITOT 0.6   Lipids No results for input(s): CHOL, TRIG, HDL, LABVLDL, LDLCALC, CHOLHDL in the last 168 hours.  Hematology Recent Labs  Lab 06/23/21 0306 06/24/21 1549 06/25/21 0241  WBC 5.8 7.3 6.5  RBC 3.68* 3.91 3.98  HGB 11.1* 12.0 11.8*  HCT 35.6* 36.5 37.2  MCV 96.7 93.4 93.5  MCH 30.2 30.7 29.6  MCHC 31.2 32.9 31.7  RDW 14.6 14.6 14.3  PLT 218 259 225   Thyroid No results for input(s): TSH, FREET4 in the last 168 hours.  BNP Recent Labs  Lab 06/23/21 0306  BNP 450.2*    DDimer No results for input(s): DDIMER in the last 168 hours.   Radiology/Studies:  DG Chest 2 View  Result Date: 06/22/2021 CLINICAL  DATA:  Shortness of breath. EXAM: CHEST - 2 VIEW COMPARISON:  05/06/2021. FINDINGS: The heart size and mediastinal contours are stable. Atherosclerotic calcification of the aorta is noted. Interstitial prominence is present bilaterally. There is mild airspace disease at the left lung base. No effusion or pneumothorax. Shoulder arthroplasty changes are noted on the right. No acute osseous abnormality. IMPRESSION: Interstitial prominence bilaterally with patchy airspace disease at the left lung base, possible atelectasis or infiltrate. Electronically Signed   By: Brett Fairy M.D.   On: 06/22/2021 20:22   ECHOCARDIOGRAM COMPLETE  Result Date: 06/23/2021    ECHOCARDIOGRAM REPORT   Patient Name:   QUINA WILBOURNE Date of Exam: 06/23/2021 Medical Rec #:  [id number]        Height:       65.0 in Accession #:    [id number]       Weight:       143.6 lb Date of Birth:  1922/08/22         BSA:          1.718 m Patient Age:    27 years          BP:           145/104 mmHg Patient Gender: F                HR:           64 bpm. Exam Location:  Inpatient Procedure: 2D Echo Indications:    Dyspnea  History:        Patient has prior history of Echocardiogram examinations, most                 recent 10/16/2020. CHF, Arrythmias:Atrial Fibrillation; Risk                 Factors:Hypertension and Diabetes.  Sonographer:    Jefferey Pica Referring Phys: [id number] [place]  1. Left ventricular ejection fraction, by estimation, is 55 to 60%. The left ventricle has normal function. The left ventricle has no regional wall motion abnormalities. Left ventricular diastolic parameters are consistent with Grade II diastolic dysfunction (pseudonormalization).  2. Right ventricular systolic function is normal. The right ventricular size is normal. There is moderately elevated pulmonary artery systolic pressure. The estimated right ventricular systolic pressure is 30.8 mmHg.  3. Left atrial size was moderately dilated.  4. Right atrial size was moderately dilated.  5. The mitral valve is normal in structure. Mild mitral valve regurgitation. No evidence of mitral stenosis.  6. Tricuspid valve regurgitation is moderate to severe.  7. The aortic valve is tricuspid. There is mild calcification of the aortic valve. Aortic valve regurgitation is not visualized. Aortic valve sclerosis is present, with no evidence of aortic valve stenosis.  8. The inferior vena cava is normal in size with <50% respiratory variability, suggesting right atrial pressure of 8 mmHg. Comparison(s): No significant change from prior study. Prior images reviewed side by side. FINDINGS  Left Ventricle: Left ventricular ejection fraction, by estimation, is 55 to 60%. The left ventricle has normal function. The left ventricle has no regional wall motion abnormalities. The left ventricular internal cavity size was normal in size. There is  no left ventricular hypertrophy. Left ventricular  diastolic parameters are consistent with Grade II diastolic dysfunction (pseudonormalization). Right Ventricle: The right ventricular size is normal. No increase in right ventricular wall thickness. Right ventricular systolic function is normal. There is moderately elevated pulmonary artery systolic pressure. The tricuspid regurgitant velocity  is 3.32 m/s, and with an assumed right atrial pressure of 8 mmHg, the estimated right ventricular systolic pressure is 16.1 mmHg. Left Atrium: Left atrial size was moderately dilated. Right Atrium: Right atrial size was moderately dilated. Pericardium: There is no evidence of pericardial effusion. Mitral Valve: The mitral valve is normal in structure. Mild mitral annular calcification. Mild mitral valve regurgitation. No evidence of mitral valve stenosis. Tricuspid Valve: The tricuspid valve is normal in structure. Tricuspid valve regurgitation is moderate to severe. No evidence of tricuspid stenosis. Aortic Valve: The aortic valve is tricuspid. There is mild calcification of the aortic valve. Aortic valve regurgitation is not visualized. Aortic valve sclerosis is present, with no evidence of aortic valve stenosis. Aortic valve peak gradient measures 12.4 mmHg. Pulmonic Valve: The pulmonic valve was normal in structure. Pulmonic valve regurgitation is not visualized. No evidence of pulmonic stenosis. Aorta: The aortic root is normal in size and structure. Venous: The inferior vena cava is normal in size with less than 50% respiratory variability, suggesting right atrial pressure of 8 mmHg. IAS/Shunts: No atrial level shunt detected by color flow Doppler.  LEFT VENTRICLE PLAX 2D LVIDd:         4.00 cm   Diastology LVIDs:         2.80 cm   LV e' medial:    6.60 cm/s LV PW:         1.00 cm   LV E/e' medial:  19.1 LV IVS:        0.90 cm   LV e' lateral:   7.15 cm/s LVOT diam:     1.80 cm   LV E/e' lateral: 17.6 LV SV:         53 LV SV Index:   31 LVOT Area:     2.54 cm  RIGHT  VENTRICLE            IVC RV Basal diam:  3.00 cm    IVC diam: 2.00 cm RV Mid diam:    3.20 cm RV S prime:     8.45 cm/s TAPSE (M-mode): 2.0 cm LEFT ATRIUM           Index        RIGHT ATRIUM           Index LA diam:      4.40 cm 2.56 cm/m   RA Area:     20.30 cm LA Vol (A2C): 71.4 ml 41.55 ml/m  RA Volume:   59.00 ml  34.34 ml/m LA Vol (A4C): 83.8 ml 48.77 ml/m  AORTIC VALVE                 PULMONIC VALVE AV Area (Vmax): 1.82 cm     PV Vmax:       0.80 m/s AV Vmax:        176.00 cm/s  PV Peak grad:  2.5 mmHg AV Peak Grad:   12.4 mmHg LVOT Vmax:      126.00 cm/s LVOT Vmean:     74.800 cm/s LVOT VTI:       0.209 m  AORTA Ao Root diam: 3.10 cm Ao Asc diam:  3.60 cm MITRAL VALVE                TRICUSPID VALVE MV Area (PHT): 6.71 cm     TR Peak grad:   44.1 mmHg MV Decel Time: 113 msec     TR Vmax:        332.00 cm/s MR Peak  grad: 97.2 mmHg MR Vmax:      493.00 cm/s   SHUNTS MV E velocity: 126.00 cm/s  Systemic VTI:  0.21 m                             Systemic Diam: 1.80 cm Candee Furbish Bradley Electronically signed by Candee Furbish Bradley Signature Date/Time: 06/23/2021/11:03:18 AM    Final      Assessment and Plan:   Chronic Afib with RVR: CHADS-VASc 6 (CHF, HTN, Diabetes, age x2, gender)  - Historically very difficult to rate control  - PTA was on digoxin 0.0625, cardizem cd 327m, metoprolol 575mBID, xarelto 15 mg  - Per chart review, Dr. HoPercival Spanishecreased digoxin to 0.0625 on 05/29/2021 because of patient's age and size and did not want it to be increased given risk of toxicity.  - Patient currently on home doses of digoxin, metoprolol.  - Had an episode of RVR early this AM. HR reached 130. Started on dilt drip. Per telemetry, rate has improved and is mostly in the 100s-110s. Plan to transition back to home dose diltiazem as HR improves   - Consider transitioning from dig to oral amiodarone as hesitant to increase digoxin dose. Patient reports that she has not missed any doses of her xarelto, TSH normal on  05/04/21, LFTs normal on 06/22/21. Patient also has some BP room, could increase metoprolol to 7551mID or 36m61mD. Will defer to Bradley   CHF exacerbation  -Echo this admission showed EF 55-60%, grade II diastolic dysfunction, moderately dilated LA, RA. - BNP 450  - Currently on lasix IV 40mg33m. Net -1.5L fluids. Weights not completed - Creatinine stable at 0.84 - Patient euvolemic on exam, breathing has improved, on room air and tolerating well   - Consider transitioning to oral lasix 40 mg daily  - On BB - Given patient also has diabetes, could consider SGLT2i at discharge.   HTN  - Home dilt, metoprolol  - Lasix  - BP well controlled   Diabetes  - Managed per primary team   Risk Assessment/Risk Scores:    New York Heart Association (NYHA) Functional Class NYHA Class II  CHA2DS2-VASc Score = 6   This indicates a 9.7% annual risk of stroke. The patient's score is based upon: CHF History: 1 HTN History: 1 Diabetes History: 1 Stroke History: 0 Vascular Disease History: 0 Age Score: 2 Gender Score: 1        For questions or updates, please contact [facility] consult [email or web address] for contact info under    Signed, KathlMargie BilletC  06/25/2021 12:44 PM

## 2021-06-25 NOTE — Significant Event (Signed)
Patient went into A. fib with RVR.  Blood pressure was 130/70.  Initially tried Cardizem bolus 10 mg IV which proved heart rate briefly but patient went back again to RVR with a heart rate around 130.  At this point I started patient on Cardizem infusion.  Gean Birchwood

## 2021-06-25 NOTE — Progress Notes (Signed)
HPI: Tara Bradley is a 88 y.o. female with medical history significant of thoracic aortic aneurysm, syncope, atrial fibrillation, hypertension, hyperlipidemia, diabetes, diverticulosis, CHF, cancer, anxiety, insomnia presenting with ongoing shortness of breath.  Patient states that she has had a couple weeks of shortness of breath.  She states that she is also had some increased lower extremity edema.  And she has had 1 to 2 weeks of increased cough congestion and fatigue.  She recently had her beta-blocker increased at her cardiologist visit from 25 to 50 mg twice daily.  She also had her Lasix increased from 20 to 40 mg daily with some improvement in her edema.  She also reports having a red eye.  She typically lives independently but does have family visiting from [place] at this time.  She states that she went to [facility] after-hours clinic to be evaluated and was sent to the ED for further evaluation due to hypoxia.  She denies fevers, chills, chest pain, abdominal pain, constipation, diarrhea, nausea, vomiting.  ED Course: Vital signs in the ED significant for heart rate in the 80s to 130s, respiratory rate in the 20s to 30s, initially saturating 87% on room air with improvement on 2 L.  Lab work-up showed CMP with glucose 334, protein 6.1, albumin 3.4.  CBC with hemoglobin stable 11.8.  Troponin normal with repeat pending.  Digoxin level low at 0.5.  BNP pending.  Respiratory panel flu COVID-negative.  Chest x-ray showed interstitial prominence with patchy disease at the left base consistent with atelectasis versus infiltrate.  Patient received Tylenol, ceftriaxone, azithromycin, Lasix, metoprolol, metformin, Xarelto, Polytrim in the ED.  Subjective Continues to improve, still with rates up into the 120s  Physical Exam: Vitals:   06/25/21 0900 06/25/21 1000 06/25/21 1040 06/25/21 1214  BP: 119/76 131/82 136/77   Pulse: 95 (!) 114 (!) 116 86  Resp:   20   Temp:      TempSrc:   Oral    SpO2: 91% 90% 93% 91%  Weight:        Physical Exam Constitutional:      General: She is not in acute distress.    Appearance: Normal appearance.  HENT:     Head: Normocephalic and atraumatic.     Mouth/Throat:     Mouth: Mucous membranes are moist.     Pharynx: Oropharynx is clear.  Eyes:     Extraocular Movements: Extraocular movements intact.     Pupils: Pupils are equal, round, and reactive to light.  Cardiovascular:     Rate and Rhythm: Normal rate. Rhythm irregular.     Pulses: Normal pulses.     Heart sounds: Normal heart sounds.  Pulmonary:     Effort: Pulmonary effort is normal. No respiratory distress.     Breath sounds: Normal breath sounds.  Abdominal:     General: Bowel sounds are normal. There is no distension.     Palpations: Abdomen is soft.     Tenderness: There is no abdominal tenderness.  Musculoskeletal:        General: No swelling or deformity.     Right lower leg: No edema.     Left lower leg: No edema.  Skin:    General: Skin is warm and dry.  Neurological:     General: No focal deficit present.     Mental Status: Mental status is at baseline.   Labs on Admission: I have personally reviewed following labs and imaging studies  CBC: Recent Labs  Lab  06/22/21 1950 06/23/21 0306 06/24/21 1549 06/25/21 0241  WBC 7.6 5.8 7.3 6.5  NEUTROABS 6.3  --  5.5 4.2  HGB 11.8* 11.1* 12.0 11.8*  HCT 37.7 35.6* 36.5 37.2  MCV 96.4 96.7 93.4 93.5  PLT 247 218 259 225     Basic Metabolic Panel: Recent Labs  Lab 06/22/21 1950 06/22/21 2240 06/23/21 0306 06/24/21 1549 06/25/21 0241  NA 135  --  138 135 137  K 4.0  --  3.6 3.9 3.6  CL 98  --  100 93* 95*  CO2 29  --  29 32 32  GLUCOSE 334*  --  207* 163* 166*  BUN 13  --  12 14 14   CREATININE 0.85  --  0.75 0.80 0.84  CALCIUM 8.9  --  8.6* 8.9 8.6*  MG  --  1.7  --   --   --      GFR: Estimated Creatinine Clearance: 34.4 mL/min (by C-G formula based on SCr of 0.84 mg/dL).  Liver Function  Tests: Recent Labs  Lab 06/22/21 1950  AST 19  ALT 11  ALKPHOS 122  BILITOT 0.6  PROT 6.1*  ALBUMIN 3.4*     Urine analysis:    Component Value Date/Time   COLORURINE YELLOW 05/03/2021 0645   APPEARANCEUR CLEAR 05/03/2021 0645   LABSPEC 1.021 05/03/2021 0645   PHURINE 6.0 05/03/2021 0645   GLUCOSEU 150 (A) 05/03/2021 0645   HGBUR NEGATIVE 05/03/2021 0645   BILIRUBINUR NEGATIVE 05/03/2021 0645   BILIRUBINUR n 07/23/2013 1343   KETONESUR 5 (A) 05/03/2021 0645   PROTEINUR NEGATIVE 05/03/2021 0645   UROBILINOGEN 0.2 07/23/2013 1343   UROBILINOGEN 0.2 03/07/2012 2123   NITRITE NEGATIVE 05/03/2021 0645   LEUKOCYTESUR NEGATIVE 05/03/2021 0645    Radiological Exams on Admission: No results found.  EKG: Independently reviewed.  Fibrillation at 105 bpm.  Some baseline artifact versus fibrillation.  Nonspecific T wave changes.  Assessment/Plan Principal Problem:   CHF exacerbation (HCC) Active Problems:   DM type 2, controlled, with complication ([place])   Persistent atrial fibrillation (HCC)   Hypertension   Acute respiratory failure with hypoxia (HCC)   Conjunctivitis   CHF exacerbation Respiratory failure with hypoxia ?  Viral pneumonia > Patient presenting with ongoing shortness of breath that has been progressive with worsening edema as well partially responsive to increased dose of Lasix. > Also with increased cough, congestion, fatigue for the past week or 2. > Chest x-ray showing interstitial prominence possible left base infiltrate versus atelectasis.  No leukocytosis.  Dig level low at 0.5.  BNP 450 > Mentation consistent with CHF exacerbation with possible viral pneumonia is no leukocytosis to indicate bacterial pneumonia.  Does have family visiting from [place] who has a young child and she does have a conjunctivitis just once or viral. - Monitor on telemetry - Continue with Lasix 40 mg twice daily IV - Strict I's and O's, daily weights - Echocardiogram EF  60% - Trend renal function electrolytes - magnesium 1.7 - Check RVP - Trend fever curve and white count -Wean down to room air.  Overall improving.  Conjunctivitis - Continue with Polytrim  Atrial fibrillation > Has history of atrial fibrillation which has been historically difficult to control.  Did have episode of RVR in the ED which resolved when she got her home dose of metoprolol. - Continue home digoxin, diltiazem, metoprolol - Continue home Xarelto -Started on Cardizem drip  -Obtain cardiology consultation primary cardiologist is Dr. Debara Pickett.  Sounds like  patient says her heart rate is always in the 120s may need to adjust her dig dosing  Hypertension - Continue home diltiazem, metoprolol - Lasix as above  Diabetes - SSI   Obtain physical therapy consult.  Disposition plan when rate is better controlled.  Obtaining cardiology consult.  Patient is asymptomatic of uncontrolled rate.  Amelita Risinger A MD

## 2021-06-26 LAB — CBC WITH DIFFERENTIAL/PLATELET
Abs Immature Granulocytes: 0.1 10*3/uL — ABNORMAL HIGH (ref 0.00–0.07)
Basophils Absolute: 0.1 10*3/uL (ref 0.0–0.1)
Basophils Relative: 1 %
Eosinophils Absolute: 0.1 10*3/uL (ref 0.0–0.5)
Eosinophils Relative: 2 %
HCT: 38.1 % (ref 36.0–46.0)
Hemoglobin: 12.5 g/dL (ref 12.0–15.0)
Immature Granulocytes: 2 %
Lymphocytes Relative: 20 %
Lymphs Abs: 1.3 10*3/uL (ref 0.7–4.0)
MCH: 30.2 pg (ref 26.0–34.0)
MCHC: 32.8 g/dL (ref 30.0–36.0)
MCV: 92 fL (ref 80.0–100.0)
Monocytes Absolute: 0.9 10*3/uL (ref 0.1–1.0)
Monocytes Relative: 15 %
Neutro Abs: 3.8 10*3/uL (ref 1.7–7.7)
Neutrophils Relative %: 60 %
Platelets: 192 10*3/uL (ref 150–400)
RBC: 4.14 MIL/uL (ref 3.87–5.11)
RDW: 14.4 % (ref 11.5–15.5)
WBC: 6.3 10*3/uL (ref 4.0–10.5)
nRBC: 0 % (ref 0.0–0.2)

## 2021-06-26 LAB — BASIC METABOLIC PANEL
Anion gap: 13 (ref 5–15)
BUN: 15 mg/dL (ref 8–23)
CO2: 26 mmol/L (ref 22–32)
Calcium: 8.7 mg/dL — ABNORMAL LOW (ref 8.9–10.3)
Chloride: 94 mmol/L — ABNORMAL LOW (ref 98–111)
Creatinine, Ser: 0.69 mg/dL (ref 0.44–1.00)
GFR, Estimated: >60 mL/min (ref >60)
Glucose, Bld: 226 mg/dL — ABNORMAL HIGH (ref 70–99)
Potassium: 3.8 mmol/L (ref 3.5–5.1)
Sodium: 133 mmol/L — ABNORMAL LOW (ref 135–145)

## 2021-06-26 LAB — GLUCOSE, CAPILLARY: Glucose-Capillary: 212 mg/dL — ABNORMAL HIGH (ref 70–99)

## 2021-06-26 MED ORDER — METOPROLOL SUCCINATE ER 100 MG PO TB24: 100.0000 mg | ORAL_TABLET | Freq: Two times a day (BID) | ORAL | 0 refills | Status: DC

## 2021-06-26 MED ORDER — FUROSEMIDE 40 MG PO TABS: 40.0000 mg | ORAL_TABLET | Freq: Every day | ORAL | 0 refills | Status: DC

## 2021-06-26 NOTE — TOC Transition Note (Signed)
Transition of Care [facility]) - CM/SW Discharge Note   Patient Details  Name: Tara Bradley MRN: [id number] Date of Birth: [birth year]  Transition of Care [facility]) CM/SW Contact:  Zenon Mayo, RN Phone Number: 06/26/2021, 10:34 AM   Clinical Narrative:    Patient is for dc today, NCM notified Stacie with [facility].     Final next level of care: [place] Barriers to Discharge: No Barriers Identified   Patient Goals and CMS Choice Patient states their goals for this hospitalization and ongoing recovery are:: return home CMS Medicare.gov Compare Post Acute Care list provided to:: Patient Choice offered to / list presented to : Patient  Discharge Placement                       Discharge Plan and Services                  DME Agency: NA       HH Arranged: RN, Disease Management [place] Agency: [place] Date [facility] Agency Contacted: 06/26/21 Time [place]: 1034 Representative spoke with at [place]: [place] ([place]) Interventions     Readmission Risk Interventions Readmission Risk Prevention Plan 05/11/2021  Transportation Screening Complete  Home Care Screening Complete  Some recent data might be hidden

## 2021-06-26 NOTE — Progress Notes (Signed)
Progress Note  Patient Name: Tara Bradley Date of Encounter: 06/26/2021  [facility] Cardiologist: [name], MD   Subjective   Denies any CP or SOB.   Inpatient Medications    Scheduled Meds:  digoxin  0.0625 mg Oral Daily   diltiazem  360 mg Oral Daily   furosemide  40 mg Oral Daily   insulin aspart  0-9 Units Subcutaneous TID WC   metoprolol succinate  100 mg Oral BID   Rivaroxaban  15 mg Oral Q supper   sodium chloride flush  3 mL Intravenous Q12H   trimethoprim-polymyxin b  2 drop Right Eye Q4H   Continuous Infusions:  diltiazem (CARDIZEM) infusion Stopped (06/26/21 0941)   PRN Meds: acetaminophen **OR** acetaminophen, polyethylene glycol   Vital Signs    Vitals:   06/25/21 1835 06/25/21 2008 06/26/21 0458 06/26/21 0500  BP: 131/73 122/75 135/88   Pulse: (!) 54 97 85   Resp:  18 19   Temp:  98.4 F (36.9 C) 98.5 F (36.9 C)   TempSrc:  Oral Oral   SpO2: 93% 97% 98%   Weight:   60.6 kg 61 kg    Intake/Output Summary (Last 24 hours) at 06/26/2021 1000 Last data filed at 06/26/2021 0958 Gross per 24 hour  Intake 1126.62 ml  Output 0 ml  Net 1126.62 ml   Last 3 Weights 06/26/2021 06/26/2021 06/25/2021  Weight (lbs) 134 lb 7.7 oz 133 lb 11.2 oz 134 lb 9.6 oz  Weight (kg) 61 kg 60.646 kg 61.054 kg      Telemetry    Atrial fibrillation with HR 80-90s - Personally Reviewed  ECG    Atrial fibrillation with HR 105 - Personally Reviewed  Physical Exam   GEN: No acute distress.   Neck: No JVD Cardiac: irregularly irregular, no murmurs, rubs, or gallops.  Respiratory: Clear to auscultation bilaterally. GI: Soft, nontender, non-distended  MS: No edema; No deformity. Neuro:  Nonfocal  Psych: Normal affect   Labs    High Sensitivity Troponin:   Recent Labs  Lab 06/22/21 2103 06/22/21 2240  TROPONINIHS 12 11     Chemistry Recent Labs  Lab 06/22/21 1950 06/22/21 2240 06/23/21 0306 06/24/21 1549 06/25/21 0241 06/26/21 0304  NA 135  --     < > 135 137 133*  K 4.0  --    < > 3.9 3.6 3.8  CL 98  --    < > 93* 95* 94*  CO2 29  --    < > 32 32 26  GLUCOSE 334*  --    < > 163* 166* 226*  BUN 13  --    < > 14 14 15   CREATININE 0.85  --    < > 0.80 0.84 0.69  CALCIUM 8.9  --    < > 8.9 8.6* 8.7*  MG  --  1.7  --   --   --   --   PROT 6.1*  --   --   --   --   --   ALBUMIN 3.4*  --   --   --   --   --   AST 19  --   --   --   --   --   ALT 11  --   --   --   --   --   ALKPHOS 122  --   --   --   --   --   BILITOT 0.6  --   --   --   --   --  GFRNONAA >60  --    < > >60 >60 >60  ANIONGAP 8  --    < > 10 10 13    < > = values in this interval not displayed.    Lipids No results for input(s): CHOL, TRIG, HDL, LABVLDL, LDLCALC, CHOLHDL in the last 168 hours.  Hematology Recent Labs  Lab 06/24/21 1549 06/25/21 0241 06/26/21 0304  WBC 7.3 6.5 6.3  RBC 3.91 3.98 4.14  HGB 12.0 11.8* 12.5  HCT 36.5 37.2 38.1  MCV 93.4 93.5 92.0  MCH 30.7 29.6 30.2  MCHC 32.9 31.7 32.8  RDW 14.6 14.3 14.4  PLT 259 225 192   Thyroid No results for input(s): TSH, FREET4 in the last 168 hours.  BNP Recent Labs  Lab 06/23/21 0306  BNP 450.2*    DDimer No results for input(s): DDIMER in the last 168 hours.   Radiology    No results found.  Cardiac Studies   Echo 06/23/2021  1. Left ventricular ejection fraction, by estimation, is 55 to 60%. The  left ventricle has normal function. The left ventricle has no regional  wall motion abnormalities. Left ventricular diastolic parameters are  consistent with Grade II diastolic  dysfunction (pseudonormalization).   2. Right ventricular systolic function is normal. The right ventricular  size is normal. There is moderately elevated pulmonary artery systolic  pressure. The estimated right ventricular systolic pressure is 11.9 mmHg.   3. Left atrial size was moderately dilated.   4. Right atrial size was moderately dilated.   5. The mitral valve is normal in structure. Mild mitral valve   regurgitation. No evidence of mitral stenosis.   6. Tricuspid valve regurgitation is moderate to severe.   7. The aortic valve is tricuspid. There is mild calcification of the  aortic valve. Aortic valve regurgitation is not visualized. Aortic valve  sclerosis is present, with no evidence of aortic valve stenosis.   8. The inferior vena cava is normal in size with <50% respiratory  variability, suggesting right atrial pressure of 8 mmHg.   Comparison(s): No significant change from prior study. Prior images  reviewed side by side.   Patient Profile     88 y.o. female with PMH of chronic afib on Xarelto, chronic combined systolic and diastolic heart failure, thoracic aortic aneurysm, HTN, and HLD presented with SOB and diagnosed with PNA. Cardiology consulted for elevated HR.   Assessment & Plan    Atrial fibrillation with RVR:   - initially switched PO dilitiazem to IV, now back to 360mg  daily PO cardizem. Continue on home dose of digoxin. Metoprolol succinate increased to 100mg  BID  - HR very well controlled around 80-90s   - no further recommendation.   Viral PNA: appears to be euvolemic on exam today. Continue on 40mg  daily PO lasix. Previously on 20mg  daily at home. Need BMET in 1 week   Chronic combined systolic and diastolic heart failure  - Echo 06/23/2021 showed EF 55-60%, grade 2 DD, RVSP 52.1 mmHg, moderate to severe TR, mild MR, moderate LAE   HTN  HLD       For questions or updates, please contact [facility] Please consult [email or web address] for contact info under        Signed, Almyra Deforest,   06/26/2021, 10:00 AM

## 2021-06-26 NOTE — Discharge Summary (Signed)
Physician Discharge Summary  Tara Bradley IWP:[id number] DOB: 06-05-1922 DOA: 06/22/2021  PCP: [name], MD  Admit date: 06/22/2021 Discharge date: 06/26/2021   Discharge Diagnoses:  Principal Problem:   CHF exacerbation ([place]) Active Problems:   DM type 2, controlled, with complication ([place])   Persistent atrial fibrillation ([place])   Hypertension   Acute respiratory failure with hypoxia ([place])   Conjunctivitis   Community acquired pneumonia   Discharge Condition: Stable   Filed Weights   06/25/21 0300 06/26/21 0458 06/26/21 0500  Weight: 61.1 kg 60.6 kg 61 kg    History of present illness:  Tara Bradley is a 88 y.o. female with medical history significant of thoracic aortic aneurysm, syncope, atrial fibrillation, hypertension, hyperlipidemia, diabetes, diverticulosis, CHF, cancer, anxiety, insomnia presenting with ongoing shortness of breath.  Patient states that she has had a couple weeks of shortness of breath.  She states that she is also had some increased lower extremity edema.  And she has had 1 to 2 weeks of increased cough congestion and fatigue.  She recently had her beta-blocker increased at her cardiologist visit from 25 to 50 mg twice daily.  She also had her Lasix increased from 20 to 40 mg daily with some improvement in her edema.  She also reports having a red eye.  She typically lives independently but does have family visiting from [place] at this time.  She states that she went to [facility] after-hours clinic to be evaluated and was sent to the ED for further evaluation due to hypoxia.  She denies fevers, chills, chest pain, abdominal pain, constipation, diarrhea, nausea, vomiting.   ED Course: Vital signs in the ED significant for heart rate in the 80s to 130s, respiratory rate in the 20s to 30s, initially saturating 87% on room air with improvement on 2 L.  Lab work-up showed CMP with glucose 334, protein 6.1, albumin 3.4.  CBC with hemoglobin stable 11.8.   Troponin normal with repeat pending.  Digoxin level low at 0.5.  BNP pending.  Respiratory panel flu COVID-negative.  Chest x-ray showed interstitial prominence with patchy disease at the left base consistent with atelectasis versus infiltrate.  Patient received Tylenol, ceftriaxone, azithromycin, Lasix, metoprolol, metformin, Xarelto, Polytrim in the ED.  Hospital Course:  CHF exacerbation Respiratory failure with hypoxia ?  Viral pneumonia > Patient presenting with ongoing shortness of breath that has been progressive with worsening edema as well partially responsive to increased dose of Lasix. > Also with increased cough, congestion, fatigue for the past week or 2. > Chest x-ray showing interstitial prominence possible left base infiltrate versus atelectasis.  No leukocytosis.  Dig level low at 0.5.  BNP 450 > Mentation consistent with CHF exacerbation with possible viral pneumonia is no leukocytosis to indicate bacterial pneumonia.  Does have family visiting from [place] who has a young child and she does have a conjunctivitis  -Improved with Lasix 40 mg twice daily IV - Echocardiogram EF 60% -Wean down oxygen on room air -Adjust rate controlling drugs for better A-fib control -Discharged on Lasix 40 mg p.o. daily   Conjunctivitis -Polytrim completed   Atrial fibrillation > Has history of atrial fibrillation which has been historically difficult to control.  - Continue home digoxin, diltiazem, metoprolol has been increased - Continue home Xarelto -Better controlled with increasing beta-blocker  Hypertension - Continue home diltiazem, metoprolol - Lasix as above   Patient follow-up primary care physician in 1 to 2 weeks at which point I would repeat a  BMP to monitor renal function.  Assess her heart rate and blood pressure and adjust medications as tolerates.  Follow with Dr. Debara Pickett in 2 to 4 weeks  Physical therapy Home health has been set up for patient at home.  Discharge  Exam: Vitals:   06/25/21 2008 06/26/21 0458  BP: 122/75 135/88  Pulse: 97 85  Resp: 18 19  Temp: 98.4 F (36.9 C) 98.5 F (36.9 C)  SpO2: 97% 98%    General: Alert and oriented no acute distress Cardiovascular: Regular rate and rhythm no murmurs rubs or gallops Respiratory: Clear to auscultation bilateral no wheezes rhonchi or rales  Discharge Instructions   Discharge Instructions     Diet - low sodium heart healthy   Complete by: As directed    Discharge instructions   Complete by: As directed    Follow-up primary care physician in 1 week  Follow-up with cardiologist in 2 to 4 weeks   Increase activity slowly   Complete by: As directed       Allergies as of 06/26/2021       Reactions   Ambien [zolpidem Tartrate]    Hallucinations   Ambien [zolpidem]    Other reaction(s): hallucinations   Codeine Other (See Comments)   headache Other reaction(s): headache   Codeine Sulfate    REACTION: unspecified   Dulaglutide    Other reaction(s): diarrhea   Latex Hives   Mirtazapine    Other reaction(s): heart palpitations   Morphine Sulfate    Other reaction(s): nausea/vomiting        Medication List     STOP taking these medications    HumaLOG Mix 50/50 KwikPen (50-50) 100 UNIT/ML Kwikpen Generic drug: Insulin Lispro Prot & Lispro       TAKE these medications    BD Pen Needle Nano U/F 32G X 4 MM Misc Generic drug: Insulin Pen Needle Inject into the skin daily.   Calcium Carbonate-Vitamin D 600-400 MG-UNIT tablet Take 1 tablet by mouth daily.   digoxin 0.125 MG tablet Commonly known as: LANOXIN TAKE 1/2 TABLET BY MOUTH EVERY DAY   diltiazem 360 MG 24 hr capsule Commonly known as: CARDIZEM CD Take 1 capsule (360 mg total) by mouth daily.   furosemide 40 MG tablet Commonly known as: LASIX Take 1 tablet (40 mg total) by mouth daily. Start taking on: June 27, 2021 What changed:  medication strength how much to take   Magnesium Oxide 250 MG  Tabs Take 250 mg by mouth daily.   metFORMIN 500 MG (MOD) 24 hr tablet Commonly known as: GLUMETZA Take 1,000 mg by mouth every evening.   metoprolol succinate 100 MG 24 hr tablet Commonly known as: TOPROL-XL Take 1 tablet (100 mg total) by mouth 2 (two) times daily. Take with or immediately following a meal. What changed:  medication strength how much to take additional instructions   multivitamin-lutein Caps capsule Take 1 capsule by mouth daily.   polyethylene glycol 17 g packet Commonly known as: MIRALAX / GLYCOLAX Take 17 g by mouth daily.   potassium chloride 10 MEQ tablet Commonly known as: KLOR-CON Take 10 mEq by mouth daily.   senna-docusate 8.6-50 MG tablet Commonly known as: Senokot-S Take 1 tablet by mouth 2 (two) times daily.   Vitamin D 50 MCG (2000 UT) tablet Take 1,000 Units by mouth daily.   Xarelto 15 MG Tabs tablet Generic drug: Rivaroxaban TAKE 1 TABLET ONCE DAILY WITH SUPPER. What changed: See the new instructions.  Allergies  Allergen Reactions   Ambien [Zolpidem Tartrate]     Hallucinations    Ambien [Zolpidem]     Other reaction(s): hallucinations   Codeine Other (See Comments)    headache Other reaction(s): headache   Codeine Sulfate     REACTION: unspecified   Dulaglutide     Other reaction(s): diarrhea   Latex Hives   Mirtazapine     Other reaction(s): heart palpitations   Morphine Sulfate     Other reaction(s): nausea/vomiting    Follow-up Information     [facility].   Specialty: [place] Why: [place] information: [place] [id number] [phone]                  The results of significant diagnostics from this hospitalization (including imaging, microbiology, ancillary and laboratory) are listed below for reference.    Significant Diagnostic Studies: DG Chest 2 View  Result Date: 06/22/2021 CLINICAL DATA:  Shortness of breath. EXAM:  CHEST - 2 VIEW COMPARISON:  05/06/2021. FINDINGS: The heart size and mediastinal contours are stable. Atherosclerotic calcification of the aorta is noted. Interstitial prominence is present bilaterally. There is mild airspace disease at the left lung base. No effusion or pneumothorax. Shoulder arthroplasty changes are noted on the right. No acute osseous abnormality. IMPRESSION: Interstitial prominence bilaterally with patchy airspace disease at the left lung base, possible atelectasis or infiltrate. Electronically Signed   By: Brett Fairy M.D.   On: 06/22/2021 20:22   ECHOCARDIOGRAM COMPLETE  Result Date: 06/23/2021    ECHOCARDIOGRAM REPORT   Patient Name:   Tara Bradley Date of Exam: 06/23/2021 Medical Rec #:  [id number]        Height:       65.0 in Accession #:    [id number]       Weight:       143.6 lb Date of Birth:  12-Apr-1923         BSA:          1.718 m Patient Age:    89 years         BP:           145/104 mmHg Patient Gender: F                HR:           64 bpm. Exam Location:  Inpatient Procedure: 2D Echo Indications:    Dyspnea  History:        Patient has prior history of Echocardiogram examinations, most                 recent 10/16/2020. CHF, Arrythmias:Atrial Fibrillation; Risk                 Factors:Hypertension and Diabetes.  Sonographer:    Jefferey Pica Referring Phys: [id number] [place]  1. Left ventricular ejection fraction, by estimation, is 55 to 60%. The left ventricle has normal function. The left ventricle has no regional wall motion abnormalities. Left ventricular diastolic parameters are consistent with Grade II diastolic dysfunction (pseudonormalization).  2. Right ventricular systolic function is normal. The right ventricular size is normal. There is moderately elevated pulmonary artery systolic pressure. The estimated right ventricular systolic pressure is 51.7 mmHg.  3. Left atrial size was moderately dilated.  4. Right atrial size was moderately  dilated.  5. The mitral valve is normal in structure. Mild mitral valve regurgitation. No evidence  of mitral stenosis.  6. Tricuspid valve regurgitation is moderate to severe.  7. The aortic valve is tricuspid. There is mild calcification of the aortic valve. Aortic valve regurgitation is not visualized. Aortic valve sclerosis is present, with no evidence of aortic valve stenosis.  8. The inferior vena cava is normal in size with <50% respiratory variability, suggesting right atrial pressure of 8 mmHg. Comparison(s): No significant change from prior study. Prior images reviewed side by side. FINDINGS  Left Ventricle: Left ventricular ejection fraction, by estimation, is 55 to 60%. The left ventricle has normal function. The left ventricle has no regional wall motion abnormalities. The left ventricular internal cavity size was normal in size. There is  no left ventricular hypertrophy. Left ventricular diastolic parameters are consistent with Grade II diastolic dysfunction (pseudonormalization). Right Ventricle: The right ventricular size is normal. No increase in right ventricular wall thickness. Right ventricular systolic function is normal. There is moderately elevated pulmonary artery systolic pressure. The tricuspid regurgitant velocity is 3.32 m/s, and with an assumed right atrial pressure of 8 mmHg, the estimated right ventricular systolic pressure is 67.6 mmHg. Left Atrium: Left atrial size was moderately dilated. Right Atrium: Right atrial size was moderately dilated. Pericardium: There is no evidence of pericardial effusion. Mitral Valve: The mitral valve is normal in structure. Mild mitral annular calcification. Mild mitral valve regurgitation. No evidence of mitral valve stenosis. Tricuspid Valve: The tricuspid valve is normal in structure. Tricuspid valve regurgitation is moderate to severe. No evidence of tricuspid stenosis. Aortic Valve: The aortic valve is tricuspid. There is mild calcification of the  aortic valve. Aortic valve regurgitation is not visualized. Aortic valve sclerosis is present, with no evidence of aortic valve stenosis. Aortic valve peak gradient measures 12.4 mmHg. Pulmonic Valve: The pulmonic valve was normal in structure. Pulmonic valve regurgitation is not visualized. No evidence of pulmonic stenosis. Aorta: The aortic root is normal in size and structure. Venous: The inferior vena cava is normal in size with less than 50% respiratory variability, suggesting right atrial pressure of 8 mmHg. IAS/Shunts: No atrial level shunt detected by color flow Doppler.  LEFT VENTRICLE PLAX 2D LVIDd:         4.00 cm   Diastology LVIDs:         2.80 cm   LV e' medial:    6.60 cm/s LV PW:         1.00 cm   LV E/e' medial:  19.1 LV IVS:        0.90 cm   LV e' lateral:   7.15 cm/s LVOT diam:     1.80 cm   LV E/e' lateral: 17.6 LV SV:         53 LV SV Index:   31 LVOT Area:     2.54 cm  RIGHT VENTRICLE            IVC RV Basal diam:  3.00 cm    IVC diam: 2.00 cm RV Mid diam:    3.20 cm RV S prime:     8.45 cm/s TAPSE (M-mode): 2.0 cm LEFT ATRIUM           Index        RIGHT ATRIUM           Index LA diam:      4.40 cm 2.56 cm/m   RA Area:     20.30 cm LA Vol (A2C): 71.4 ml 41.55 ml/m  RA Volume:   59.00 ml  34.34  ml/m LA Vol (A4C): 83.8 ml 48.77 ml/m  AORTIC VALVE                 PULMONIC VALVE AV Area (Vmax): 1.82 cm     PV Vmax:       0.80 m/s AV Vmax:        176.00 cm/s  PV Peak grad:  2.5 mmHg AV Peak Grad:   12.4 mmHg LVOT Vmax:      126.00 cm/s LVOT Vmean:     74.800 cm/s LVOT VTI:       0.209 m  AORTA Ao Root diam: 3.10 cm Ao Asc diam:  3.60 cm MITRAL VALVE                TRICUSPID VALVE MV Area (PHT): 6.71 cm     TR Peak grad:   44.1 mmHg MV Decel Time: 113 msec     TR Vmax:        332.00 cm/s MR Peak grad: 97.2 mmHg MR Vmax:      493.00 cm/s   SHUNTS MV E velocity: 126.00 cm/s  Systemic VTI:  0.21 m                             Systemic Diam: 1.80 cm Candee Furbish MD Electronically signed by Candee Furbish MD Signature Date/Time: 06/23/2021/11:03:18 AM    Final     Microbiology: Recent Results (from the past 240 hour(s))  Resp Panel by RT-PCR (Flu A&B, Covid) Nasopharyngeal Swab     Status: None   Collection Time: 06/22/21  7:39 PM   Specimen: Nasopharyngeal Swab; Nasopharyngeal(NP) swabs in vial transport medium  Result Value Ref Range Status   SARS Coronavirus 2 by RT PCR NEGATIVE NEGATIVE Final    Comment: (NOTE) SARS-CoV-2 target nucleic acids are NOT DETECTED.  The SARS-CoV-2 RNA is generally detectable in upper respiratory specimens during the acute phase of infection. The lowest concentration of SARS-CoV-2 viral copies this assay can detect is 138 copies/mL. A negative result does not preclude SARS-Cov-2 infection and should not be used as the sole basis for treatment or other patient management decisions. A negative result may occur with  improper specimen collection/handling, submission of specimen other than nasopharyngeal swab, presence of viral mutation(s) within the areas targeted by this assay, and inadequate number of viral copies(<138 copies/mL). A negative result must be combined with clinical observations, patient history, and epidemiological information. The expected result is Negative.  Fact Sheet for Patients:  EntrepreneurPulse.com.au  Fact Sheet for Healthcare Providers:  IncredibleEmployment.be  This test is no t yet approved or cleared by the [place] FDA and  has been authorized for detection and/or diagnosis of SARS-CoV-2 by FDA under an Emergency Use Authorization (EUA). This EUA will remain  in effect (meaning this test can be used) for the duration of the COVID-19 declaration under Section 564(b)(1) of the Act, 21 U.S.C.section 360bbb-3(b)(1), unless the authorization is terminated  or revoked sooner.       Influenza A by PCR NEGATIVE NEGATIVE Final   Influenza B by PCR NEGATIVE NEGATIVE Final     Comment: (NOTE) The Xpert Xpress SARS-CoV-2/FLU/RSV plus assay is intended as an aid in the diagnosis of influenza from Nasopharyngeal swab specimens and should not be used as a sole basis for treatment. Nasal washings and aspirates are unacceptable for Xpert Xpress SARS-CoV-2/FLU/RSV testing.  Fact Sheet for Patients: EntrepreneurPulse.com.au  Fact Sheet for Healthcare Providers:  IncredibleEmployment.be  This test is not yet approved or cleared by the [place] and has been authorized for detection and/or diagnosis of SARS-CoV-2 by FDA under an Emergency Use Authorization (EUA). This EUA will remain in effect (meaning this test can be used) for the duration of the COVID-19 declaration under Section 564(b)(1) of the Act, 21 U.S.C. section 360bbb-3(b)(1), unless the authorization is terminated or revoked.  Performed at [facility], [place] [address] [place], [place] [address]   Respiratory (~20 pathogens) panel by PCR     Status: None   Collection Time: 06/22/21  8:23 PM   Specimen: Nasopharyngeal Swab; Respiratory  Result Value Ref Range Status   Adenovirus NOT DETECTED NOT DETECTED Final   Coronavirus 229E NOT DETECTED NOT DETECTED Final    Comment: (NOTE) The Coronavirus on the Respiratory Panel, DOES NOT test for the novel  Coronavirus (2019 nCoV)    Coronavirus HKU1 NOT DETECTED NOT DETECTED Final   Coronavirus NL63 NOT DETECTED NOT DETECTED Final   Coronavirus OC43 NOT DETECTED NOT DETECTED Final   Metapneumovirus NOT DETECTED NOT DETECTED Final   Rhinovirus / Enterovirus NOT DETECTED NOT DETECTED Final   Influenza A NOT DETECTED NOT DETECTED Final   Influenza B NOT DETECTED NOT DETECTED Final   Parainfluenza Virus 1 NOT DETECTED NOT DETECTED Final   Parainfluenza Virus 2 NOT DETECTED NOT DETECTED Final   Parainfluenza Virus 3 NOT DETECTED NOT DETECTED Final   Parainfluenza Virus 4 NOT DETECTED NOT DETECTED Final    Respiratory Syncytial Virus NOT DETECTED NOT DETECTED Final   Bordetella pertussis NOT DETECTED NOT DETECTED Final   Bordetella Parapertussis NOT DETECTED NOT DETECTED Final   Chlamydophila pneumoniae NOT DETECTED NOT DETECTED Final   Mycoplasma pneumoniae NOT DETECTED NOT DETECTED Final    Comment: Performed at [facility], [place]. [address], [place], [place] [address]     Labs: Basic Metabolic Panel: Recent Labs  Lab 06/22/21 1950 06/22/21 2240 06/23/21 0306 06/24/21 1549 06/25/21 0241 06/26/21 0304  NA 135  --  138 135 137 133*  K 4.0  --  3.6 3.9 3.6 3.8  CL 98  --  100 93* 95* 94*  CO2 29  --  29 32 32 26  GLUCOSE 334*  --  207* 163* 166* 226*  BUN 13  --  12 14 14 15   CREATININE 0.85  --  0.75 0.80 0.84 0.69  CALCIUM 8.9  --  8.6* 8.9 8.6* 8.7*  MG  --  1.7  --   --   --   --    Liver Function Tests: Recent Labs  Lab 06/22/21 1950  AST 19  ALT 11  ALKPHOS 122  BILITOT 0.6  PROT 6.1*  ALBUMIN 3.4*   No results for input(s): LIPASE, AMYLASE in the last 168 hours. No results for input(s): AMMONIA in the last 168 hours. CBC: Recent Labs  Lab 06/22/21 1950 06/23/21 0306 06/24/21 1549 06/25/21 0241 06/26/21 0304  WBC 7.6 5.8 7.3 6.5 6.3  NEUTROABS 6.3  --  5.5 4.2 3.8  HGB 11.8* 11.1* 12.0 11.8* 12.5  HCT 37.7 35.6* 36.5 37.2 38.1  MCV 96.4 96.7 93.4 93.5 92.0  PLT 247 218 259 225 192   Cardiac Enzymes: No results for input(s): CKTOTAL, CKMB, CKMBINDEX, TROPONINI in the last 168 hours. BNP: BNP (last 3 results) Recent Labs    05/07/21 0126 06/23/21 0306  BNP 124.6* 450.2*    ProBNP (last 3 results) No results  for input(s): PROBNP in the last 8760 hours.  CBG: Recent Labs  Lab 06/25/21 0611 06/25/21 1043 06/25/21 1604 06/25/21 2057 06/26/21 0558  GLUCAP 179* 250* 180* 271* 212*       Signed:  Royetta Probus A MD.  [facility] 06/26/2021, 5:08 PM

## 2021-06-30 DIAGNOSIS — D485 Neoplasm of uncertain behavior of skin: Secondary | ICD-10-CM | POA: Diagnosis not present

## 2021-06-30 DIAGNOSIS — L814 Other melanin hyperpigmentation: Secondary | ICD-10-CM | POA: Diagnosis not present

## 2021-06-30 DIAGNOSIS — L720 Epidermal cyst: Secondary | ICD-10-CM | POA: Diagnosis not present

## 2021-06-30 DIAGNOSIS — B079 Viral wart, unspecified: Secondary | ICD-10-CM | POA: Diagnosis not present

## 2021-06-30 DIAGNOSIS — Z08 Encounter for follow-up examination after completed treatment for malignant neoplasm: Secondary | ICD-10-CM | POA: Diagnosis not present

## 2021-06-30 DIAGNOSIS — L570 Actinic keratosis: Secondary | ICD-10-CM | POA: Diagnosis not present

## 2021-06-30 DIAGNOSIS — Z85828 Personal history of other malignant neoplasm of skin: Secondary | ICD-10-CM | POA: Diagnosis not present

## 2021-06-30 DIAGNOSIS — L821 Other seborrheic keratosis: Secondary | ICD-10-CM | POA: Diagnosis not present

## 2021-06-30 DIAGNOSIS — L718 Other rosacea: Secondary | ICD-10-CM | POA: Diagnosis not present

## 2021-07-01 DIAGNOSIS — I5042 Chronic combined systolic (congestive) and diastolic (congestive) heart failure: Secondary | ICD-10-CM | POA: Diagnosis not present

## 2021-07-01 LAB — BASIC METABOLIC PANEL
BUN/Creatinine Ratio: 24 (ref 12–28)
BUN: 21 mg/dL (ref 10–36)
CO2: 31 mmol/L — ABNORMAL HIGH (ref 20–29)
Calcium: 10 mg/dL (ref 8.7–10.3)
Chloride: 96 mmol/L (ref 96–106)
Creatinine, Ser: 0.89 mg/dL (ref 0.57–1.00)
Glucose: 281 mg/dL — ABNORMAL HIGH (ref 70–99)
Potassium: 4.4 mmol/L (ref 3.5–5.2)
Sodium: 140 mmol/L (ref 134–144)
eGFR: 59 mL/min/{1.73_m2} — ABNORMAL LOW (ref >59)

## 2021-07-02 DIAGNOSIS — I5032 Chronic diastolic (congestive) heart failure: Secondary | ICD-10-CM | POA: Diagnosis not present

## 2021-07-02 DIAGNOSIS — I4891 Unspecified atrial fibrillation: Secondary | ICD-10-CM | POA: Diagnosis not present

## 2021-07-15 DIAGNOSIS — I10 Essential (primary) hypertension: Secondary | ICD-10-CM | POA: Diagnosis not present

## 2021-07-15 DIAGNOSIS — N182 Chronic kidney disease, stage 2 (mild): Secondary | ICD-10-CM | POA: Diagnosis not present

## 2021-07-15 DIAGNOSIS — Z794 Long term (current) use of insulin: Secondary | ICD-10-CM | POA: Diagnosis not present

## 2021-07-15 DIAGNOSIS — D649 Anemia, unspecified: Secondary | ICD-10-CM | POA: Diagnosis not present

## 2021-07-15 DIAGNOSIS — I5032 Chronic diastolic (congestive) heart failure: Secondary | ICD-10-CM | POA: Diagnosis not present

## 2021-07-15 DIAGNOSIS — D6869 Other thrombophilia: Secondary | ICD-10-CM | POA: Diagnosis not present

## 2021-07-15 DIAGNOSIS — Z0000 Encounter for general adult medical examination without abnormal findings: Secondary | ICD-10-CM | POA: Diagnosis not present

## 2021-07-15 DIAGNOSIS — E1165 Type 2 diabetes mellitus with hyperglycemia: Secondary | ICD-10-CM | POA: Diagnosis not present

## 2021-07-15 DIAGNOSIS — E113291 Type 2 diabetes mellitus with mild nonproliferative diabetic retinopathy without macular edema, right eye: Secondary | ICD-10-CM | POA: Diagnosis not present

## 2021-07-15 DIAGNOSIS — I480 Paroxysmal atrial fibrillation: Secondary | ICD-10-CM | POA: Diagnosis not present

## 2021-07-15 DIAGNOSIS — I359 Nonrheumatic aortic valve disorder, unspecified: Secondary | ICD-10-CM | POA: Diagnosis not present

## 2021-07-16 DIAGNOSIS — Z4789 Encounter for other orthopedic aftercare: Secondary | ICD-10-CM | POA: Diagnosis not present

## 2021-07-17 ENCOUNTER — Other Ambulatory Visit: Payer: Self-pay

## 2021-07-17 ENCOUNTER — Ambulatory Visit: Payer: Medicare Other | Admitting: Cardiology

## 2021-07-17 ENCOUNTER — Encounter: Payer: Self-pay | Admitting: Cardiology

## 2021-07-17 VITALS — BP 128/84 | HR 66 | Ht 65.5 in | Wt 135.4 lb

## 2021-07-17 DIAGNOSIS — I5042 Chronic combined systolic (congestive) and diastolic (congestive) heart failure: Secondary | ICD-10-CM | POA: Diagnosis not present

## 2021-07-17 NOTE — Patient Instructions (Signed)
Medication Instructions:  No Changes In Medications at this time.  *If you need a refill on your cardiac medications before your next appointment, please call your pharmacy*  Lab Work: BMET- TODAY  If you have labs (blood work) drawn today and your tests are completely normal, you will receive your results only by: [facility] (if you have MyChart) OR A paper copy in the mail If you have any lab test that is abnormal or we need to change your treatment, we will call you to review the results.  Follow-Up: At [facility], you and your health needs are our priority.  As part of our continuing mission to provide you with exceptional heart care, we have created designated Provider Care Teams.  These Care Teams include your primary Cardiologist (physician) and Advanced Practice Providers (APPs -  Physician Assistants and Nurse Practitioners) who all work together to provide you with the care you need, when you need it.  Your next appointment:   3 month(s)  The format for your next appointment:   In Person  Provider:   Sande Rives, PA-C    {

## 2021-07-17 NOTE — Progress Notes (Signed)
Cardiology Office Note   Date:  07/17/2021   ID:  Tara Bradley, DOB [birth year], MRN [id number]  PCP:  [name], MD  Cardiologist: Dr. Percival Spanish, 08/15/2017 [name], MD 05/26/2017   Chief Complaint  Patient presents with   Atrial Fibrillation   Shortness of Breath     History of Present Illness: Tara Bradley is a 88 y.o. female with a history of an aortic thrombosed descending aortic dissection, aortic valve sclerosis and chronic afib on Xarelto.  She fell and fractured her right shoulder.  She was in atrial fib with RVR when she presented.  She was treated with rate control and Xarelto.  She had shoulder arthroplasty.  She required IV Lasix for volume overload.      She comes back today and was added onto my schedule because of a recent hospitalization with shortness of breath.  She initially thought maybe have viral pneumonia but her viral titers were negative.  She was treated ultimately for diastolic heart failure.  BNP was slightly elevated.  Echo showed moderate left atrial enlargement.  EF was well-preserved.  There was evidence of diastolic dysfunction.  She was diuresed with IV Lasix and sent home on the meds as listed below.  She does think she is feeling better than when she came in.  She is not having any new shortness of breath, PND or orthopnea.  She is not really noticing her atrial fibrillation.  She is not having any chest pressure, neck or arm discomfort.  She has had no weight gain or edema.  She gets around very quickly with her walker and she has had no further falls.   Past Medical History:  Diagnosis Date   Aortic valve sclerosis    Echo, 2008   Arthritis    Bradycardia    October, 2012   Cancer [facility])    Chronic insomnia    Closed fracture of unspecified part of femur 2005   Diabetes mellitus, type 2 ([place])    Diverticulitis    Diverticulosis    Ejection fraction    EF 60%, echo, February, 2008  //   EF 65-70%, echo, November, 2012    Hyperlipidemia    Hypertension    Long term (current) use of anticoagulants    Osteopenia    Osteoporosis    femur fracture 2005, pelvic fracture 2006   Persistent atrial fibrillation ([place])    Personal history of malignant neoplasm of breast    Syncope    Thoracic aortic aneurysm 02/2017   Unspecified closed fracture of pelvis 2006    Past Surgical History:  Procedure Laterality Date   CARDIOVERSION N/A 08/09/2012   Procedure: CARDIOVERSION;  Surgeon: Deboraha Sprang, MD;  Location: [place];  Service: Cardiovascular;  Laterality: N/A;   EYE SURGERY     FEMUR SURGERY  2005   ORIF   IR ANGIOGRAM PELVIS SELECTIVE OR SUPRASELECTIVE  03/11/2017   IR ANGIOGRAM SELECTIVE EACH ADDITIONAL VESSEL  03/11/2017   IR EMBO ART  VEN HEMORR LYMPH EXTRAV  INC GUIDE ROADMAPPING  03/11/2017   IR FLUORO GUIDE CV LINE RIGHT  03/11/2017   IR US GUIDE VASC ACCESS RIGHT  03/11/2017   IR US GUIDE VASC ACCESS RIGHT  03/11/2017   MASTECTOMY  1995   left   MINOR HARDWARE REMOVAL Left 09/01/2020   Procedure: REMOVAL OF HARDWARE LEFT FOREARM;  Surgeon: Milly Jakob, MD;  Location: [place];  Service: Orthopedics;  Laterality: Left;  ORIF ULNAR FRACTURE Left 10/19/2019   Procedure: OPEN REDUCTION INTERNAL FIXATION (ORIF)  LEFT DISTAL RADIUS  AND ULNA FRACTURE;  Surgeon: Milly Jakob, MD;  Location: [place];  Service: Orthopedics;  Laterality: Left;   REVERSE SHOULDER ARTHROPLASTY Right 05/04/2021   Procedure: REVERSE SHOULDER ARTHROPLASTY;  Surgeon: Netta Cedars, MD;  Location: [place];  Service: Orthopedics;  Laterality: Right;   SHOULDER SURGERY     left   SHOULDER SURGERY  1979, 2003   TONSILLECTOMY     TOTAL ABDOMINAL HYSTERECTOMY W/ BILATERAL SALPINGOOPHORECTOMY  1995   WRIST SURGERY      x 2     Prior to Admission medications   Medication Sig Start Date End Date Taking? Authorizing Provider  BD PEN NEEDLE NANO U/F 32G X 4 MM MISC Inject into the skin daily. 11/25/20  Yes  [provider]  Calcium Carbonate-Vitamin D 600-400 MG-UNIT tablet Take 1 tablet by mouth daily.   Yes [provider]  Cholecalciferol (VITAMIN D) 2000 UNITS tablet Take 1,000 Units by mouth daily.   Yes [provider]  diltiazem (CARDIZEM CD) 360 MG 24 hr capsule Take 1 capsule (360 mg total) by mouth daily. 05/12/21  Yes Shelly Coss, MD  furosemide (LASIX) 40 MG tablet Take 1 tablet (40 mg total) by mouth daily. 06/27/21  Yes Phillips Grout, MD  Magnesium Oxide 250 MG TABS Take 250 mg by mouth daily.   Yes [provider]  metFORMIN (GLUMETZA) 500 MG (MOD) 24 hr tablet Take 1,000 mg by mouth every evening.   Yes [provider]  metoprolol succinate (TOPROL-XL) 100 MG 24 hr tablet Take 1 tablet (100 mg total) by mouth 2 (two) times daily. Take with or immediately following a meal. 06/26/21 07/26/21 Yes Phillips Grout, MD  multivitamin-lutein [facility]) CAPS capsule Take 1 capsule by mouth daily.   Yes [provider]  polyethylene glycol (MIRALAX / GLYCOLAX) 17 g packet Take 17 g by mouth daily. 05/12/21  Yes Shelly Coss, MD  potassium chloride (K-DUR) 10 MEQ tablet Take 10 mEq by mouth daily.  03/25/15  Yes [provider]  XARELTO 15 MG TABS tablet TAKE 1 TABLET ONCE DAILY WITH SUPPER. Patient taking differently: Take 15 mg by mouth daily with supper. 06/22/21  Yes [name], MD      Allergies:   Ambien [zolpidem tartrate], Ambien [zolpidem], Codeine, Codeine sulfate, Dulaglutide, Latex, Mirtazapine, and Morphine sulfate    ROS:    As stated in the HPI and negative for all other systems.    PHYSICAL EXAM: VS:  BP 128/84    Pulse 66    Ht 5' 5.5" (1.664 m)    Wt 135 lb 6.4 oz (61.4 kg)    SpO2 94%    BMI 22.19 kg/m  , BMI Body mass index is 22.19 kg/m. GENERAL:  Well appearing NECK:  No jugular venous distention, waveform within normal limits, carotid upstroke brisk and symmetric, no bruits, no  thyromegaly LUNGS:  Clear to auscultation bilaterally CHEST:  Unremarkable HEART:  PMI not displaced or sustained,S1 and S2 within normal limits, no S3, no clicks, no rubs, no murmurs, irregular  ABD:  Flat, positive bowel sounds normal in frequency in pitch, no bruits, no rebound, no guarding, no midline pulsatile mass, no hepatomegaly, no splenomegaly EXT:  2 plus pulses throughout, no edema, no cyanosis no clubbing   EKG:  EKG is  not ordered today.    Recent Labs: 05/04/2021: TSH 2.569 06/22/2021: ALT 11;  Magnesium 1.7 06/23/2021: B Natriuretic Peptide 450.2 06/26/2021: Hemoglobin 12.5; Platelets 192 07/01/2021: BUN 21; Creatinine, Ser 0.89; Potassium 4.4; Sodium 140    Lipid Panel    Component Value Date/Time   CHOL 132 03/13/2017 0431   TRIG 72 03/13/2017 0431   HDL 48 03/13/2017 0431   CHOLHDL 2.8 03/13/2017 0431   VLDL 14 03/13/2017 0431   LDLCALC 70 03/13/2017 0431   LDLDIRECT 37 03/10/2009 1106     Wt Readings from Last 3 Encounters:  07/17/21 135 lb 6.4 oz (61.4 kg)  06/26/21 134 lb 7.7 oz (61 kg)  05/29/21 143 lb 9.6 oz (65.1 kg)     Other studies Reviewed: Additional studies/ records that were reviewed today include: Hospital records  ASSESSMENT AND PLAN  Permanent atrial fibrillation:    Tara Bradley has a CHA2DS2 - VASc score of 5.  She tolerates this.  She is on now on a higher dose of metoprolol and Cardizem compared to what she was on previously.  I reviewed her medications carefully to make sure they were accurate coming out of the hospital and she seems to be on the right dosing.  Her rate seems to be well controlled.  She is tolerating anticoagulation.  No change in therapy.   Chronic systolic and diastolic CHF:    She does seem to be euvolemic.  We talked at length about this.  She understands salt and fluid restriction.  She seems to be euvolemic.  I will check a basic metabolic profile today.  Aortic aneurysm with chronic descending dissection:      She has been this and would not want any therapy given her advanced age so no further imaging.    Current medicines are reviewed at length with the patient today.  The patient does not have concerns regarding medicines.  The following changes have been made:   None  Labs/ tests ordered today include:   Orders Placed This Encounter  Procedures   Basic metabolic panel     Disposition:    Follow up in APP in 3 months   Signed, [name], MD  07/17/2021 12:39 PM    [place]

## 2021-07-18 LAB — BASIC METABOLIC PANEL
BUN/Creatinine Ratio: 22 (ref 12–28)
BUN: 19 mg/dL (ref 10–36)
CO2: 29 mmol/L (ref 20–29)
Calcium: 9.8 mg/dL (ref 8.7–10.3)
Chloride: 98 mmol/L (ref 96–106)
Creatinine, Ser: 0.85 mg/dL (ref 0.57–1.00)
Glucose: 140 mg/dL — ABNORMAL HIGH (ref 70–99)
Potassium: 4.1 mmol/L (ref 3.5–5.2)
Sodium: 140 mmol/L (ref 134–144)
eGFR: 62 mL/min/{1.73_m2} (ref >59)

## 2021-07-22 ENCOUNTER — Encounter: Payer: Self-pay | Admitting: *Deleted

## 2021-07-28 ENCOUNTER — Other Ambulatory Visit: Payer: Self-pay | Admitting: Cardiology

## 2021-07-28 ENCOUNTER — Ambulatory Visit: Payer: Medicare Other | Admitting: Cardiology

## 2021-07-29 ENCOUNTER — Other Ambulatory Visit: Payer: Self-pay | Admitting: Cardiology

## 2021-07-31 DIAGNOSIS — M810 Age-related osteoporosis without current pathological fracture: Secondary | ICD-10-CM | POA: Diagnosis not present

## 2021-07-31 DIAGNOSIS — Z8781 Personal history of (healed) traumatic fracture: Secondary | ICD-10-CM | POA: Diagnosis not present

## 2021-07-31 DIAGNOSIS — E119 Type 2 diabetes mellitus without complications: Secondary | ICD-10-CM | POA: Diagnosis not present

## 2021-08-02 ENCOUNTER — Other Ambulatory Visit: Payer: Self-pay

## 2021-08-02 ENCOUNTER — Encounter (HOSPITAL_COMMUNITY)
Admission: EM | Disposition: A | Discharge status: Skilled Nursing Facility | Payer: Self-pay | Source: Home / Self Care | Attending: Internal Medicine

## 2021-08-02 ENCOUNTER — Emergency Department (HOSPITAL_COMMUNITY): Payer: Medicare Other

## 2021-08-02 ENCOUNTER — Inpatient Hospital Stay (HOSPITAL_COMMUNITY): Payer: Medicare Other | Admitting: Certified Registered"

## 2021-08-02 ENCOUNTER — Encounter (HOSPITAL_COMMUNITY): Payer: Self-pay | Admitting: Emergency Medicine

## 2021-08-02 ENCOUNTER — Inpatient Hospital Stay (HOSPITAL_COMMUNITY): Payer: Medicare Other

## 2021-08-02 ENCOUNTER — Inpatient Hospital Stay (HOSPITAL_COMMUNITY)
Admission: EM | Admit: 2021-08-02 | Discharge: 2021-08-08 | DRG: 481 | Disposition: A | Discharge status: Skilled Nursing Facility | Payer: Medicare Other | Attending: Internal Medicine | Admitting: Internal Medicine

## 2021-08-02 DIAGNOSIS — Z7901 Long term (current) use of anticoagulants: Secondary | ICD-10-CM | POA: Diagnosis not present

## 2021-08-02 DIAGNOSIS — M810 Age-related osteoporosis without current pathological fracture: Secondary | ICD-10-CM | POA: Diagnosis present

## 2021-08-02 DIAGNOSIS — I9581 Postprocedural hypotension: Secondary | ICD-10-CM | POA: Diagnosis not present

## 2021-08-02 DIAGNOSIS — Z9889 Other specified postprocedural states: Secondary | ICD-10-CM

## 2021-08-02 DIAGNOSIS — T1490XA Injury, unspecified, initial encounter: Secondary | ICD-10-CM

## 2021-08-02 DIAGNOSIS — R339 Retention of urine, unspecified: Secondary | ICD-10-CM | POA: Diagnosis present

## 2021-08-02 DIAGNOSIS — I5032 Chronic diastolic (congestive) heart failure: Secondary | ICD-10-CM | POA: Diagnosis present

## 2021-08-02 DIAGNOSIS — W19XXXA Unspecified fall, initial encounter: Secondary | ICD-10-CM

## 2021-08-02 DIAGNOSIS — S0990XA Unspecified injury of head, initial encounter: Secondary | ICD-10-CM | POA: Diagnosis not present

## 2021-08-02 DIAGNOSIS — Z8249 Family history of ischemic heart disease and other diseases of the circulatory system: Secondary | ICD-10-CM

## 2021-08-02 DIAGNOSIS — Z807 Family history of other malignant neoplasms of lymphoid, hematopoietic and related tissues: Secondary | ICD-10-CM

## 2021-08-02 DIAGNOSIS — R2689 Other abnormalities of gait and mobility: Secondary | ICD-10-CM | POA: Diagnosis not present

## 2021-08-02 DIAGNOSIS — N179 Acute kidney failure, unspecified: Secondary | ICD-10-CM | POA: Diagnosis present

## 2021-08-02 DIAGNOSIS — Z888 Allergy status to other drugs, medicaments and biological substances status: Secondary | ICD-10-CM

## 2021-08-02 DIAGNOSIS — E871 Hypo-osmolality and hyponatremia: Secondary | ICD-10-CM | POA: Diagnosis not present

## 2021-08-02 DIAGNOSIS — M25552 Pain in left hip: Secondary | ICD-10-CM | POA: Diagnosis present

## 2021-08-02 DIAGNOSIS — Z833 Family history of diabetes mellitus: Secondary | ICD-10-CM | POA: Diagnosis not present

## 2021-08-02 DIAGNOSIS — Z043 Encounter for examination and observation following other accident: Secondary | ICD-10-CM | POA: Diagnosis not present

## 2021-08-02 DIAGNOSIS — I110 Hypertensive heart disease with heart failure: Secondary | ICD-10-CM

## 2021-08-02 DIAGNOSIS — I4819 Other persistent atrial fibrillation: Secondary | ICD-10-CM | POA: Diagnosis not present

## 2021-08-02 DIAGNOSIS — E860 Dehydration: Secondary | ICD-10-CM | POA: Diagnosis not present

## 2021-08-02 DIAGNOSIS — E119 Type 2 diabetes mellitus without complications: Secondary | ICD-10-CM

## 2021-08-02 DIAGNOSIS — D62 Acute posthemorrhagic anemia: Secondary | ICD-10-CM | POA: Diagnosis not present

## 2021-08-02 DIAGNOSIS — M6281 Muscle weakness (generalized): Secondary | ICD-10-CM | POA: Diagnosis not present

## 2021-08-02 DIAGNOSIS — I509 Heart failure, unspecified: Secondary | ICD-10-CM | POA: Diagnosis not present

## 2021-08-02 DIAGNOSIS — Z794 Long term (current) use of insulin: Secondary | ICD-10-CM | POA: Diagnosis not present

## 2021-08-02 DIAGNOSIS — R498 Other voice and resonance disorders: Secondary | ICD-10-CM | POA: Diagnosis not present

## 2021-08-02 DIAGNOSIS — Z79899 Other long term (current) drug therapy: Secondary | ICD-10-CM

## 2021-08-02 DIAGNOSIS — R262 Difficulty in walking, not elsewhere classified: Secondary | ICD-10-CM | POA: Diagnosis not present

## 2021-08-02 DIAGNOSIS — E118 Type 2 diabetes mellitus with unspecified complications: Secondary | ICD-10-CM | POA: Diagnosis not present

## 2021-08-02 DIAGNOSIS — F5104 Psychophysiologic insomnia: Secondary | ICD-10-CM | POA: Diagnosis present

## 2021-08-02 DIAGNOSIS — I700 Atherosclerosis of aorta: Secondary | ICD-10-CM | POA: Diagnosis not present

## 2021-08-02 DIAGNOSIS — R338 Other retention of urine: Secondary | ICD-10-CM

## 2021-08-02 DIAGNOSIS — I499 Cardiac arrhythmia, unspecified: Secondary | ICD-10-CM | POA: Diagnosis not present

## 2021-08-02 DIAGNOSIS — Y92000 Kitchen of unspecified non-institutional (private) residence as  the place of occurrence of the external cause: Secondary | ICD-10-CM | POA: Diagnosis not present

## 2021-08-02 DIAGNOSIS — E875 Hyperkalemia: Secondary | ICD-10-CM | POA: Diagnosis present

## 2021-08-02 DIAGNOSIS — R0689 Other abnormalities of breathing: Secondary | ICD-10-CM

## 2021-08-02 DIAGNOSIS — I4891 Unspecified atrial fibrillation: Secondary | ICD-10-CM

## 2021-08-02 DIAGNOSIS — S72352A Displaced comminuted fracture of shaft of left femur, initial encounter for closed fracture: Secondary | ICD-10-CM | POA: Diagnosis not present

## 2021-08-02 DIAGNOSIS — I712 Thoracic aortic aneurysm, without rupture, unspecified: Secondary | ICD-10-CM | POA: Diagnosis not present

## 2021-08-02 DIAGNOSIS — I5042 Chronic combined systolic (congestive) and diastolic (congestive) heart failure: Secondary | ICD-10-CM | POA: Diagnosis not present

## 2021-08-02 DIAGNOSIS — Z743 Need for continuous supervision: Secondary | ICD-10-CM | POA: Diagnosis not present

## 2021-08-02 DIAGNOSIS — S72002A Fracture of unspecified part of neck of left femur, initial encounter for closed fracture: Secondary | ICD-10-CM | POA: Diagnosis not present

## 2021-08-02 DIAGNOSIS — I482 Chronic atrial fibrillation, unspecified: Secondary | ICD-10-CM | POA: Diagnosis not present

## 2021-08-02 DIAGNOSIS — Z20822 Contact with and (suspected) exposure to covid-19: Secondary | ICD-10-CM | POA: Diagnosis not present

## 2021-08-02 DIAGNOSIS — Z853 Personal history of malignant neoplasm of breast: Secondary | ICD-10-CM

## 2021-08-02 DIAGNOSIS — W1830XA Fall on same level, unspecified, initial encounter: Secondary | ICD-10-CM | POA: Diagnosis present

## 2021-08-02 DIAGNOSIS — M62838 Other muscle spasm: Secondary | ICD-10-CM | POA: Diagnosis not present

## 2021-08-02 DIAGNOSIS — S72142A Displaced intertrochanteric fracture of left femur, initial encounter for closed fracture: Secondary | ICD-10-CM

## 2021-08-02 DIAGNOSIS — S72009A Fracture of unspecified part of neck of unspecified femur, initial encounter for closed fracture: Secondary | ICD-10-CM | POA: Diagnosis present

## 2021-08-02 DIAGNOSIS — Z66 Do not resuscitate: Secondary | ICD-10-CM | POA: Diagnosis present

## 2021-08-02 DIAGNOSIS — S7292XA Unspecified fracture of left femur, initial encounter for closed fracture: Principal | ICD-10-CM

## 2021-08-02 DIAGNOSIS — Z882 Allergy status to sulfonamides status: Secondary | ICD-10-CM

## 2021-08-02 DIAGNOSIS — Z9010 Acquired absence of unspecified breast and nipple: Secondary | ICD-10-CM

## 2021-08-02 DIAGNOSIS — Z90710 Acquired absence of both cervix and uterus: Secondary | ICD-10-CM

## 2021-08-02 DIAGNOSIS — T148XXA Other injury of unspecified body region, initial encounter: Secondary | ICD-10-CM

## 2021-08-02 DIAGNOSIS — R0602 Shortness of breath: Secondary | ICD-10-CM

## 2021-08-02 DIAGNOSIS — Y92009 Unspecified place in unspecified non-institutional (private) residence as the place of occurrence of the external cause: Secondary | ICD-10-CM | POA: Diagnosis not present

## 2021-08-02 DIAGNOSIS — Z8781 Personal history of (healed) traumatic fracture: Secondary | ICD-10-CM | POA: Diagnosis not present

## 2021-08-02 DIAGNOSIS — M25551 Pain in right hip: Secondary | ICD-10-CM | POA: Diagnosis not present

## 2021-08-02 DIAGNOSIS — Z96611 Presence of right artificial shoulder joint: Secondary | ICD-10-CM | POA: Diagnosis present

## 2021-08-02 DIAGNOSIS — R000 Tachycardia, unspecified: Secondary | ICD-10-CM | POA: Diagnosis not present

## 2021-08-02 DIAGNOSIS — I517 Cardiomegaly: Secondary | ICD-10-CM | POA: Diagnosis not present

## 2021-08-02 DIAGNOSIS — I5043 Acute on chronic combined systolic (congestive) and diastolic (congestive) heart failure: Secondary | ICD-10-CM | POA: Diagnosis present

## 2021-08-02 DIAGNOSIS — R739 Hyperglycemia, unspecified: Secondary | ICD-10-CM | POA: Diagnosis not present

## 2021-08-02 DIAGNOSIS — Z91040 Latex allergy status: Secondary | ICD-10-CM

## 2021-08-02 DIAGNOSIS — I071 Rheumatic tricuspid insufficiency: Secondary | ICD-10-CM | POA: Diagnosis not present

## 2021-08-02 DIAGNOSIS — R2681 Unsteadiness on feet: Secondary | ICD-10-CM | POA: Diagnosis not present

## 2021-08-02 DIAGNOSIS — R5383 Other fatigue: Secondary | ICD-10-CM | POA: Diagnosis not present

## 2021-08-02 DIAGNOSIS — M6259 Muscle wasting and atrophy, not elsewhere classified, multiple sites: Secondary | ICD-10-CM | POA: Diagnosis not present

## 2021-08-02 DIAGNOSIS — E785 Hyperlipidemia, unspecified: Secondary | ICD-10-CM | POA: Diagnosis present

## 2021-08-02 DIAGNOSIS — E1165 Type 2 diabetes mellitus with hyperglycemia: Secondary | ICD-10-CM | POA: Diagnosis present

## 2021-08-02 DIAGNOSIS — I10 Essential (primary) hypertension: Secondary | ICD-10-CM | POA: Diagnosis not present

## 2021-08-02 DIAGNOSIS — Z885 Allergy status to narcotic agent status: Secondary | ICD-10-CM

## 2021-08-02 DIAGNOSIS — S72142D Displaced intertrochanteric fracture of left femur, subsequent encounter for closed fracture with routine healing: Secondary | ICD-10-CM | POA: Diagnosis not present

## 2021-08-02 DIAGNOSIS — Z90722 Acquired absence of ovaries, bilateral: Secondary | ICD-10-CM

## 2021-08-02 DIAGNOSIS — R0789 Other chest pain: Secondary | ICD-10-CM | POA: Diagnosis not present

## 2021-08-02 HISTORY — DX: Dyspnea, unspecified: R06.00

## 2021-08-02 HISTORY — DX: Pneumonia, unspecified organism: J18.9

## 2021-08-02 HISTORY — DX: Cardiac arrhythmia, unspecified: I49.9

## 2021-08-02 LAB — BASIC METABOLIC PANEL
Anion gap: 11 (ref 5–15)
BUN: 25 mg/dL — ABNORMAL HIGH (ref 8–23)
CO2: 26 mmol/L (ref 22–32)
Calcium: 8.9 mg/dL (ref 8.9–10.3)
Chloride: 96 mmol/L — ABNORMAL LOW (ref 98–111)
Creatinine, Ser: 0.9 mg/dL (ref 0.44–1.00)
GFR, Estimated: 58 mL/min — ABNORMAL LOW (ref >60)
Glucose, Bld: 299 mg/dL — ABNORMAL HIGH (ref 70–99)
Potassium: 3.7 mmol/L (ref 3.5–5.1)
Sodium: 133 mmol/L — ABNORMAL LOW (ref 135–145)

## 2021-08-02 LAB — CBC WITH DIFFERENTIAL/PLATELET
Abs Immature Granulocytes: 0.06 10*3/uL (ref 0.00–0.07)
Basophils Absolute: 0.1 10*3/uL (ref 0.0–0.1)
Basophils Relative: 1 %
Eosinophils Absolute: 0.2 10*3/uL (ref 0.0–0.5)
Eosinophils Relative: 2 %
HCT: 37.8 % (ref 36.0–46.0)
Hemoglobin: 12.3 g/dL (ref 12.0–15.0)
Immature Granulocytes: 1 %
Lymphocytes Relative: 13 %
Lymphs Abs: 1.5 10*3/uL (ref 0.7–4.0)
MCH: 30.4 pg (ref 26.0–34.0)
MCHC: 32.5 g/dL (ref 30.0–36.0)
MCV: 93.6 fL (ref 80.0–100.0)
Monocytes Absolute: 0.8 10*3/uL (ref 0.1–1.0)
Monocytes Relative: 7 %
Neutro Abs: 8.4 10*3/uL — ABNORMAL HIGH (ref 1.7–7.7)
Neutrophils Relative %: 76 %
Platelets: 251 10*3/uL (ref 150–400)
RBC: 4.04 MIL/uL (ref 3.87–5.11)
RDW: 15.5 % (ref 11.5–15.5)
WBC: 11 10*3/uL — ABNORMAL HIGH (ref 4.0–10.5)
nRBC: 0 % (ref 0.0–0.2)

## 2021-08-02 LAB — RESP PANEL BY RT-PCR (FLU A&B, COVID) ARPGX2
Influenza A by PCR: NEGATIVE
Influenza B by PCR: NEGATIVE
SARS Coronavirus 2 by RT PCR: NEGATIVE

## 2021-08-02 LAB — GLUCOSE, CAPILLARY
Glucose-Capillary: 327 mg/dL — ABNORMAL HIGH (ref 70–99)
Glucose-Capillary: 318 mg/dL — ABNORMAL HIGH (ref 70–99)
Glucose-Capillary: 218 mg/dL — ABNORMAL HIGH (ref 70–99)
Glucose-Capillary: 175 mg/dL — ABNORMAL HIGH (ref 70–99)

## 2021-08-02 LAB — PROTIME-INR
INR: 1.5 — ABNORMAL HIGH (ref 0.8–1.2)
Prothrombin Time: 18.4 seconds — ABNORMAL HIGH (ref 11.4–15.2)

## 2021-08-02 LAB — SURGICAL PCR SCREEN

## 2021-08-02 LAB — PREPARE RBC (CROSSMATCH)

## 2021-08-02 SURGERY — FIXATION, FRACTURE, INTERTROCHANTERIC, WITH INTRAMEDULLARY ROD
Anesthesia: General | Site: Leg Upper | Laterality: Left

## 2021-08-02 MED ORDER — PHENYLEPHRINE HCL-NACL 20-0.9 MG/250ML-% IV SOLN
INTRAVENOUS | Status: DC | PRN
  Administered 2021-08-02: 60 ug/min via INTRAVENOUS

## 2021-08-02 MED ORDER — LIDOCAINE 2% (20 MG/ML) 5 ML SYRINGE
INTRAMUSCULAR | Status: AC
  Filled 2021-08-02: qty 5

## 2021-08-02 MED ORDER — CHLORHEXIDINE GLUCONATE 4 % EX LIQD
60.0000 mL | Freq: Once | CUTANEOUS | Status: DC
  Filled 2021-08-02: qty 60

## 2021-08-02 MED ORDER — LACTATED RINGERS IV SOLN: INTRAVENOUS | Status: DC

## 2021-08-02 MED ORDER — POVIDONE-IODINE 10 % EX SWAB
2.0000 "application " | Freq: Once | CUTANEOUS | Status: AC
  Administered 2021-08-02: 2 via TOPICAL

## 2021-08-02 MED ORDER — METHOCARBAMOL 1000 MG/10ML IJ SOLN
500.0000 mg | Freq: Four times a day (QID) | INTRAVENOUS | Status: DC | PRN
  Filled 2021-08-02: qty 5

## 2021-08-02 MED ORDER — INSULIN REGULAR(HUMAN) IN NACL 100-0.9 UT/100ML-% IV SOLN
INTRAVENOUS | Status: DC
Start: 2021-08-02 — End: 2021-08-02
  Administered 2021-08-02: 10.5 [IU]/h via INTRAVENOUS
  Filled 2021-08-02: qty 100

## 2021-08-02 MED ORDER — PROPOFOL 10 MG/ML IV BOLUS
INTRAVENOUS | Status: DC | PRN
  Administered 2021-08-02: 80 mg via INTRAVENOUS
  Administered 2021-08-02: 30 mg via INTRAVENOUS
  Administered 2021-08-02: 20 mg via INTRAVENOUS

## 2021-08-02 MED ORDER — LIDOCAINE 2% (20 MG/ML) 5 ML SYRINGE
INTRAMUSCULAR | Status: DC | PRN
  Administered 2021-08-02: 60 mg via INTRAVENOUS

## 2021-08-02 MED ORDER — ORAL CARE MOUTH RINSE: 15.0000 mL | Freq: Once | OROMUCOSAL | Status: AC

## 2021-08-02 MED ORDER — SUGAMMADEX SODIUM 200 MG/2ML IV SOLN
INTRAVENOUS | Status: DC | PRN
  Administered 2021-08-02: 200 mg via INTRAVENOUS

## 2021-08-02 MED ORDER — METHOCARBAMOL 500 MG PO TABS
500.0000 mg | ORAL_TABLET | Freq: Four times a day (QID) | ORAL | Status: DC | PRN
  Administered 2021-08-02 – 2021-08-08 (×9): 500 mg via ORAL
  Filled 2021-08-02 (×9): qty 1

## 2021-08-02 MED ORDER — FENTANYL CITRATE (PF) 250 MCG/5ML IJ SOLN
INTRAMUSCULAR | Status: DC | PRN
  Administered 2021-08-02: 50 ug via INTRAVENOUS
  Administered 2021-08-02 (×2): 75 ug via INTRAVENOUS

## 2021-08-02 MED ORDER — ROCURONIUM BROMIDE 10 MG/ML (PF) SYRINGE
PREFILLED_SYRINGE | INTRAVENOUS | Status: DC | PRN
  Administered 2021-08-02: 20 mg via INTRAVENOUS
  Administered 2021-08-02: 50 mg via INTRAVENOUS

## 2021-08-02 MED ORDER — SUCCINYLCHOLINE CHLORIDE 200 MG/10ML IV SOSY
PREFILLED_SYRINGE | INTRAVENOUS | Status: AC
  Filled 2021-08-02: qty 10

## 2021-08-02 MED ORDER — FENTANYL CITRATE PF 50 MCG/ML IJ SOSY
50.0000 ug | PREFILLED_SYRINGE | Freq: Once | INTRAMUSCULAR | Status: AC
  Administered 2021-08-02: 50 ug via INTRAVENOUS
  Filled 2021-08-02: qty 1

## 2021-08-02 MED ORDER — TRANEXAMIC ACID-NACL 1000-0.7 MG/100ML-% IV SOLN
1000.0000 mg | INTRAVENOUS | Status: AC
  Administered 2021-08-02: 1000 mg via INTRAVENOUS

## 2021-08-02 MED ORDER — CHLORHEXIDINE GLUCONATE 0.12 % MT SOLN: 15.0000 mL | Freq: Once | OROMUCOSAL | Status: AC

## 2021-08-02 MED ORDER — INSULIN LISPRO PROT & LISPRO (50-50 MIX) 100 UNIT/ML KWIKPEN: 4.0000 [IU] | PEN_INJECTOR | Freq: Two times a day (BID) | SUBCUTANEOUS | Status: DC

## 2021-08-02 MED ORDER — DILTIAZEM HCL ER COATED BEADS 360 MG PO CP24
360.0000 mg | ORAL_CAPSULE | Freq: Every day | ORAL | Status: DC
  Administered 2021-08-02 – 2021-08-04 (×3): 360 mg via ORAL
  Filled 2021-08-02 (×3): qty 1

## 2021-08-02 MED ORDER — VANCOMYCIN HCL 500 MG IV SOLR
INTRAVENOUS | Status: AC
  Filled 2021-08-02: qty 10

## 2021-08-02 MED ORDER — METOPROLOL SUCCINATE ER 100 MG PO TB24: 100.0000 mg | ORAL_TABLET | Freq: Every day | ORAL | Status: DC

## 2021-08-02 MED ORDER — PHENYLEPHRINE 40 MCG/ML (10ML) SYRINGE FOR IV PUSH (FOR BLOOD PRESSURE SUPPORT)
PREFILLED_SYRINGE | INTRAVENOUS | Status: AC
  Filled 2021-08-02: qty 20

## 2021-08-02 MED ORDER — ONDANSETRON HCL 4 MG/2ML IJ SOLN
INTRAMUSCULAR | Status: DC | PRN
  Administered 2021-08-02: 4 mg via INTRAVENOUS

## 2021-08-02 MED ORDER — POLYETHYLENE GLYCOL 3350 17 G PO PACK: 17.0000 g | PACK | Freq: Every day | ORAL | Status: DC | PRN

## 2021-08-02 MED ORDER — ONDANSETRON HCL 4 MG/2ML IJ SOLN
INTRAMUSCULAR | Status: AC
  Filled 2021-08-02: qty 2

## 2021-08-02 MED ORDER — INSULIN ASPART 100 UNIT/ML IJ SOLN
0.0000 [IU] | Freq: Three times a day (TID) | INTRAMUSCULAR | Status: DC
  Administered 2021-08-03: 5 [IU] via SUBCUTANEOUS
  Administered 2021-08-03: 7 [IU] via SUBCUTANEOUS
  Administered 2021-08-04: 5 [IU] via SUBCUTANEOUS
  Administered 2021-08-04: 7 [IU] via SUBCUTANEOUS
  Administered 2021-08-04 – 2021-08-05 (×4): 3 [IU] via SUBCUTANEOUS
  Administered 2021-08-06: 1 [IU] via SUBCUTANEOUS
  Administered 2021-08-07: 2 [IU] via SUBCUTANEOUS
  Administered 2021-08-07 – 2021-08-08 (×2): 1 [IU] via SUBCUTANEOUS

## 2021-08-02 MED ORDER — 0.9 % SODIUM CHLORIDE (POUR BTL) OPTIME
TOPICAL | Status: DC | PRN
  Administered 2021-08-02: 1000 mL

## 2021-08-02 MED ORDER — CEFAZOLIN SODIUM-DEXTROSE 2-4 GM/100ML-% IV SOLN
2.0000 g | INTRAVENOUS | Status: AC
  Administered 2021-08-02: 2 g via INTRAVENOUS
  Filled 2021-08-02: qty 100

## 2021-08-02 MED ORDER — SUCCINYLCHOLINE CHLORIDE 200 MG/10ML IV SOSY
PREFILLED_SYRINGE | INTRAVENOUS | Status: DC | PRN
Start: 2021-08-02 — End: 2021-08-02
  Administered 2021-08-02: 100 mg via INTRAVENOUS

## 2021-08-02 MED ORDER — PROPOFOL 10 MG/ML IV BOLUS
INTRAVENOUS | Status: AC
  Filled 2021-08-02: qty 20

## 2021-08-02 MED ORDER — PHENYLEPHRINE 40 MCG/ML (10ML) SYRINGE FOR IV PUSH (FOR BLOOD PRESSURE SUPPORT)
PREFILLED_SYRINGE | INTRAVENOUS | Status: DC | PRN
Start: 2021-08-02 — End: 2021-08-02
  Administered 2021-08-02: 160 ug via INTRAVENOUS
  Administered 2021-08-02 (×2): 80 ug via INTRAVENOUS
  Administered 2021-08-02: 160 ug via INTRAVENOUS
  Administered 2021-08-02: 80 ug via INTRAVENOUS
  Administered 2021-08-02: 120 ug via INTRAVENOUS

## 2021-08-02 MED ORDER — TRANEXAMIC ACID-NACL 1000-0.7 MG/100ML-% IV SOLN
INTRAVENOUS | Status: AC
  Filled 2021-08-02: qty 100

## 2021-08-02 MED ORDER — MAGNESIUM OXIDE -MG SUPPLEMENT 400 (240 MG) MG PO TABS
400.0000 mg | ORAL_TABLET | Freq: Every day | ORAL | Status: DC
  Administered 2021-08-02 – 2021-08-08 (×7): 400 mg via ORAL
  Filled 2021-08-02 (×7): qty 1

## 2021-08-02 MED ORDER — DOCUSATE SODIUM 100 MG PO CAPS
100.0000 mg | ORAL_CAPSULE | Freq: Two times a day (BID) | ORAL | Status: DC
  Administered 2021-08-02 – 2021-08-08 (×11): 100 mg via ORAL
  Filled 2021-08-02 (×11): qty 1

## 2021-08-02 MED ORDER — METOPROLOL SUCCINATE ER 100 MG PO TB24
100.0000 mg | ORAL_TABLET | Freq: Every day | ORAL | Status: DC
  Administered 2021-08-03 – 2021-08-04 (×2): 100 mg via ORAL
  Filled 2021-08-02 (×2): qty 1

## 2021-08-02 MED ORDER — BISACODYL 5 MG PO TBEC
5.0000 mg | DELAYED_RELEASE_TABLET | Freq: Every day | ORAL | Status: DC | PRN
  Administered 2021-08-07: 5 mg via ORAL
  Filled 2021-08-02: qty 1

## 2021-08-02 MED ORDER — NALOXONE HCL 0.4 MG/ML IJ SOLN
INTRAMUSCULAR | Status: AC
  Filled 2021-08-02: qty 1

## 2021-08-02 MED ORDER — FENTANYL CITRATE (PF) 250 MCG/5ML IJ SOLN
INTRAMUSCULAR | Status: AC
  Filled 2021-08-02: qty 5

## 2021-08-02 MED ORDER — FENTANYL CITRATE (PF) 100 MCG/2ML IJ SOLN: 25.0000 ug | INTRAMUSCULAR | Status: DC | PRN

## 2021-08-02 MED ORDER — ALBUMIN HUMAN 5 % IV SOLN: INTRAVENOUS | Status: DC | PRN

## 2021-08-02 MED ORDER — OXYCODONE HCL 5 MG PO TABS
5.0000 mg | ORAL_TABLET | ORAL | Status: DC | PRN
  Administered 2021-08-02: 5 mg via ORAL
  Administered 2021-08-03 – 2021-08-05 (×2): 10 mg via ORAL
  Administered 2021-08-06 – 2021-08-07 (×4): 5 mg via ORAL
  Filled 2021-08-02 (×3): qty 1
  Filled 2021-08-02: qty 2
  Filled 2021-08-02 (×2): qty 1
  Filled 2021-08-02: qty 2

## 2021-08-02 MED ORDER — METOPROLOL SUCCINATE ER 25 MG PO TB24
100.0000 mg | ORAL_TABLET | Freq: Once | ORAL | Status: AC
  Administered 2021-08-02: 100 mg via ORAL
  Filled 2021-08-02 (×2): qty 4

## 2021-08-02 MED ORDER — SODIUM CHLORIDE 0.9% IV SOLUTION: Freq: Once | INTRAVENOUS | Status: DC

## 2021-08-02 MED ORDER — VANCOMYCIN HCL 500 MG IV SOLR
INTRAVENOUS | Status: DC | PRN
  Administered 2021-08-02: 500 mg via TOPICAL

## 2021-08-02 MED ORDER — CHLORHEXIDINE GLUCONATE 0.12 % MT SOLN
OROMUCOSAL | Status: AC
  Administered 2021-08-02: 15 mL via OROMUCOSAL
  Filled 2021-08-02: qty 15

## 2021-08-02 MED ORDER — RIVAROXABAN 10 MG PO TABS
10.0000 mg | ORAL_TABLET | Freq: Every day | ORAL | Status: DC
  Administered 2021-08-03 – 2021-08-04 (×2): 10 mg via ORAL
  Filled 2021-08-02 (×2): qty 1

## 2021-08-02 SURGICAL SUPPLY — 44 items
ADH SKN CLS LQ APL DERMABOND (GAUZE/BANDAGES/DRESSINGS) ×1
APL PRP STRL LF DISP 70% ISPRP (MISCELLANEOUS) ×2
BAG COUNTER SPONGE SURGICOUNT (BAG) ×2 IMPLANT
BAG SPNG CNTER NS LX DISP (BAG) ×1
BIT DRILL LAG SCREW META-TAN (DRILL) IMPLANT
BIT DRILL SHORT 4.0 (BIT) IMPLANT
CHLORAPREP W/TINT 26 (MISCELLANEOUS) ×3 IMPLANT
COVER PERINEAL POST (MISCELLANEOUS) ×2 IMPLANT
COVER SURGICAL LIGHT HANDLE (MISCELLANEOUS) ×2 IMPLANT
DERMABOND ADHESIVE PROPEN (GAUZE/BANDAGES/DRESSINGS) ×1
DERMABOND ADVANCED .7 DNX6 (GAUZE/BANDAGES/DRESSINGS) ×1 IMPLANT
DRAPE C-ARM 42X72 X-RAY (DRAPES) ×2 IMPLANT
DRAPE C-ARMOR (DRAPES) ×2 IMPLANT
DRAPE STERI IOBAN 125X83 (DRAPES) ×2 IMPLANT
DRAPE U-SHAPE 47X51 STRL (DRAPES) ×4 IMPLANT
DRILL BIT SHORT 4.0 (BIT) ×2
DRILL LAG SCREW META-TAN (DRILL) ×2
DRSG TEGADERM 4X4.75 (GAUZE/BANDAGES/DRESSINGS) ×6 IMPLANT
ELECT REM PT RETURN 15FT ADLT (MISCELLANEOUS) IMPLANT
GAUZE SPONGE 4X4 12PLY STRL (GAUZE/BANDAGES/DRESSINGS) ×1 IMPLANT
GAUZE SPONGE 4X4 16PLY NS LF (WOUND CARE) ×2 IMPLANT
GLOVE SRG 8 PF TXTR STRL LF DI (GLOVE) ×1 IMPLANT
GLOVE SURG UNDER POLY LF SZ8 (GLOVE) ×2
GOWN STRL REUS W/TWL LRG LVL3 (GOWN DISPOSABLE) ×2 IMPLANT
GOWN STRL REUS W/TWL XL LVL3 (GOWN DISPOSABLE) ×5 IMPLANT
GUIDE PIN 3.2X343 (PIN) ×2
GUIDE PIN 3.2X343MM (PIN) ×4
GUIDE ROD 3.0 (MISCELLANEOUS) ×2
KIT BASIN OR (CUSTOM PROCEDURE TRAY) ×2 IMPLANT
KIT TURNOVER KIT A (KITS) ×1 IMPLANT
NAIL TRIGEN TROCH META 9.0X40 (Nail) ×1 IMPLANT
NS IRRIG 1000ML POUR BTL (IV SOLUTION) ×2 IMPLANT
PACK GENERAL/GYN (CUSTOM PROCEDURE TRAY) ×2 IMPLANT
PIN GUIDE 3.2X343MM (PIN) IMPLANT
ROD GUIDE 3.0 (MISCELLANEOUS) IMPLANT
SCREW COMP LAG META 100X95 (Screw) ×1 IMPLANT
SCREW SET META-TAN 0MM (Screw) ×1 IMPLANT
SCREW TRIGEN LOW PROF 5.0X40 (Screw) ×1 IMPLANT
SCREW TRIGEN LOW PROF 5.0X42.5 (Screw) ×1 IMPLANT
SUT MNCRL AB 3-0 PS2 18 (SUTURE) ×2 IMPLANT
SUT VIC AB 0 CT1 27 (SUTURE) ×2
SUT VIC AB 0 CT1 27XBRD ANBCTR (SUTURE) ×1 IMPLANT
SUT VIC AB 2-0 CT2 18 VCP726D (SUTURE) ×2 IMPLANT
TOWEL OR NON WOVEN STRL DISP B (DISPOSABLE) ×2 IMPLANT

## 2021-08-02 NOTE — Assessment & Plan Note (Signed)
-  last LDL was 70  ?-She does not appear to be taking medications for this issue at this time ?

## 2021-08-02 NOTE — Progress Notes (Signed)
Notified Dr. Nyoka Cowden of blood sugar 327, stated to start endo tool for pt.. Also informed her pt. Ate cookie 0500 and 0300 drank water and 1/2 cup choc. Milk. ?

## 2021-08-02 NOTE — Anesthesia Procedure Notes (Signed)
Procedure Name: Intubation ?Date/Time: 08/02/2021 1:26 PM ?Performed by: Janace Litten, CRNA ?Pre-anesthesia Checklist: Patient identified, Emergency Drugs available, Suction available and Patient being monitored ?Patient Re-evaluated:Patient Re-evaluated prior to induction ?Oxygen Delivery Method: Circle System Utilized ?Preoxygenation: Pre-oxygenation with 100% oxygen ?Induction Type: IV induction and Rapid sequence ?Laryngoscope Size: Mac and 3 ?Grade View: Grade I ?Tube type: Oral ?Tube size: 7.0 mm ?Number of attempts: 1 ?Airway Equipment and Method: Stylet and Oral airway ?Placement Confirmation: ETT inserted through vocal cords under direct vision, positive ETCO2 and breath sounds checked- equal and bilateral ?Secured at: 20 cm ?Tube secured with: Tape ?Dental Injury: Teeth and Oropharynx as per pre-operative assessment  ? ? ? ? ?

## 2021-08-02 NOTE — Assessment & Plan Note (Addendum)
-  Appears to be a mechanical fall -She reports that she felt like the hip gave way prior to the fall -No other obvious injuries currently

## 2021-08-02 NOTE — Transfer of Care (Signed)
Immediate Anesthesia Transfer of Care Note ? ?Patient: Tara Bradley ? ?Procedure(s) Performed: INTRAMEDULLARY (IM) NAIL INTERTROCHANTRIC (Left: Leg Upper) ? ?Patient Location: PACU ? ?Anesthesia Type:General ? ?Level of Consciousness: drowsy, patient cooperative and responds to stimulation ? ?Airway & Oxygen Therapy: Patient Spontanous Breathing ? ?Post-op Assessment: Report given to RN and Post -op Vital signs reviewed and stable ? ?Post vital signs: Reviewed and stable ? ?Last Vitals:  ?Vitals Value Taken Time  ?BP 99/67 08/02/21 1543  ?Temp    ?Pulse 139 08/02/21 1544  ?Resp 17 08/02/21 1544  ?SpO2 89 % 08/02/21 1544  ?Vitals shown include unvalidated device data. ? ?Last Pain:  ?Vitals:  ? 08/02/21 1227  ?TempSrc: Oral  ?PainSc: 0-No pain  ?   ? ?  ? ?Complications: No notable events documented. ?

## 2021-08-02 NOTE — Assessment & Plan Note (Signed)
-  recent echo with preserved EF, grade 2 diastolic dysfunction -Will monitor for evidence of volume overload

## 2021-08-02 NOTE — Plan of Care (Signed)
  Problem: Education: Goal: Verbalization of understanding the information provided (i.e., activity precautions, restrictions, etc) will improve Outcome: Progressing Goal: Individualized Educational Video(s) Outcome: Progressing   Problem: Activity: Goal: Ability to ambulate and perform ADLs will improve Outcome: Progressing   

## 2021-08-02 NOTE — Assessment & Plan Note (Addendum)
Code status is DNR ?

## 2021-08-02 NOTE — Assessment & Plan Note (Addendum)
-  Patient with chronic afib , frequently going into RVR -Continue home Cardizem and Toprol XL -On Xarelto for anticoagulation -Monitor on telemetry -Needed to call cardiology consultation today.  Heart rate remains in the range of 130s/140s despite being on above medications

## 2021-08-02 NOTE — Assessment & Plan Note (Addendum)
-  Blood pressure soft but she is asymptomatic -Continue diltiazem, metoprolol for Afib control

## 2021-08-02 NOTE — H&P (Signed)
°History and Physical  ° ° °Patient: Tara Bradley MRN:[id number] DOB: 04/15/1923 °DOA: 08/02/2021 °DOS: the patient was seen and examined on 08/02/2021 °PCP: Wolters, Sharon, MD  °Patient coming from: Home - lives alone; NOK: Nephew, Bill Livingston, [phone] ° ° °Chief Complaint: Fall ° °HPI: Tara Bradley is a 88 y.o. female with medical history significant of DM; HTN; HLD; chronic diastolic CHF; and afib on Xarelto presenting with a fall.   She was last admitted from 1/30-2/3 for CHF exacerbation and possible viral PNA.  She got up about 730 this AM and was walking in the kitchen and she just gave way and fell.  She feels like maybe the hip broke and caused her to fall but she isn't sure.  She did not get light-headed or dizzy.  She did hit the back of her head.  She just collapsed and landed on her hip.  She had a shoulder fracture previously - had taken a sleeping pill for the first time and was disoriented and got caught in the blanket in December.   ° ° ° °ER Course:  Ground-level fall, R hip fracture.  Dr. Marchawany will operate today vs. Tomorrow.  She is NPO.  No other injuries. ° ° ° ° °Review of Systems: As mentioned in the history of present illness. All other systems reviewed and are negative. °Past Medical History:  °Diagnosis Date  ° Aortic valve sclerosis   ° Echo, 2008  ° Arthritis   ° Bradycardia   ° October, 2012  ° Cancer (HCC)   ° Chronic insomnia   ° Closed fracture of unspecified part of femur 2005  ° Diabetes mellitus, type 2 (HCC)   ° Diverticulitis   ° Diverticulosis   ° Dyspnea   ° Dysrhythmia   ° Ejection fraction   ° EF 60%, echo, February, 2008  //   EF 65-70%, echo, November, 2012  ° Hyperlipidemia   ° Hypertension   ° Long term (current) use of anticoagulants   ° Osteopenia   ° Osteoporosis   ° femur fracture 2005, pelvic fracture 2006  ° Persistent atrial fibrillation (HCC)   ° Personal history of malignant neoplasm of breast   ° Pneumonia   ° Syncope   ° Thoracic aortic  aneurysm 02/2017  ° Unspecified closed fracture of pelvis 2006  ° °Past Surgical History:  °Procedure Laterality Date  ° CARDIOVERSION N/A 08/09/2012  ° Procedure: CARDIOVERSION;  Surgeon: Steven C Klein, MD;  Location: [facility];  Service: Cardiovascular;  Laterality: N/A;  ° EYE SURGERY    ° FEMUR SURGERY  2005  ° ORIF  ° IR ANGIOGRAM PELVIS SELECTIVE OR SUPRASELECTIVE  03/11/2017  ° IR ANGIOGRAM SELECTIVE EACH ADDITIONAL VESSEL  03/11/2017  ° IR EMBO ART  VEN HEMORR LYMPH EXTRAV  INC GUIDE ROADMAPPING  03/11/2017  ° IR FLUORO GUIDE CV LINE RIGHT  03/11/2017  ° IR US GUIDE VASC ACCESS RIGHT  03/11/2017  ° IR US GUIDE VASC ACCESS RIGHT  03/11/2017  ° MASTECTOMY  1995  ° left  ° MINOR HARDWARE REMOVAL Left 09/01/2020  ° Procedure: REMOVAL OF HARDWARE LEFT FOREARM;  Surgeon: Thompson, David, MD;  Location: [facility];  Service: Orthopedics;  Laterality: Left;  ° ORIF ULNAR FRACTURE Left 10/19/2019  ° Procedure: OPEN REDUCTION INTERNAL FIXATION (ORIF)  LEFT DISTAL RADIUS  AND ULNA FRACTURE;  Surgeon: Thompson, David, MD;  Location: [facility];  Service: Orthopedics;  Laterality: Left;  ° REVERSE SHOULDER ARTHROPLASTY Right 05/04/2021  °   Procedure: REVERSE SHOULDER ARTHROPLASTY;  Surgeon: Netta Cedars, MD;  Location: [place];  Service: Orthopedics;  Laterality: Right;   SHOULDER SURGERY     left   SHOULDER SURGERY  1979, 2003   TONSILLECTOMY     TOTAL ABDOMINAL HYSTERECTOMY W/ BILATERAL SALPINGOOPHORECTOMY  1995   WRIST SURGERY      x 2    Social History:  reports that she has never smoked. She has never used smokeless tobacco. She reports that she does not drink alcohol and does not use drugs.  Allergies  Allergen Reactions   Ambien [Zolpidem Tartrate] Other (See Comments)    Hallucinations    Ambien [Zolpidem] Other (See Comments)    hallucinations   Codeine Other (See Comments)    headache    Codeine Sulfate Other (See Comments)    REACTION: unspecified   Dulaglutide Diarrhea   Latex  Hives   Morphine Sulfate Nausea And Vomiting   Mirtazapine Palpitations    Family History  Problem Relation Age of Onset   Congestive Heart Failure Mother 10   Heart attack Father 39   Diabetes type II Father    Diabetes type II Brother        1/2   Multiple myeloma Brother        2/2   Diabetes type II Son    Colon cancer Neg Hx     Prior to Admission medications   Medication Sig Start Date End Date Taking? Authorizing Provider  metoprolol succinate (TOPROL-XL) 100 MG 24 hr tablet TAKE ONE TABLET BY MOUTH TWICE DAILY. TAKE WITH OR IMMEDIATELY FOLLOWING A MEAL 07/29/21   [name], MD  BD PEN NEEDLE NANO U/F 32G X 4 MM MISC Inject into the skin daily. 11/25/20   [provider]  Calcium Carbonate-Vitamin D 600-400 MG-UNIT tablet Take 1 tablet by mouth daily.    [provider]  Cholecalciferol (VITAMIN D) 2000 UNITS tablet Take 1,000 Units by mouth daily.    [provider]  diltiazem (CARDIZEM CD) 360 MG 24 hr capsule Take 1 capsule (360 mg total) by mouth daily. 05/12/21   Shelly Coss, MD  furosemide (LASIX) 40 MG tablet TAKE ONE TABLET BY MOUTH DAILY 07/29/21   [name], MD  Magnesium Oxide 250 MG TABS Take 250 mg by mouth daily.    [provider]  metFORMIN (GLUMETZA) 500 MG (MOD) 24 hr tablet Take 1,000 mg by mouth every evening.    [provider]  multivitamin-lutein (OCUVITE-LUTEIN) CAPS capsule Take 1 capsule by mouth daily.    [provider]  polyethylene glycol (MIRALAX / GLYCOLAX) 17 g packet Take 17 g by mouth daily. 05/12/21   Shelly Coss, MD  potassium chloride (K-DUR) 10 MEQ tablet Take 10 mEq by mouth daily.  03/25/15   [provider]  XARELTO 15 MG TABS tablet TAKE 1 TABLET ONCE DAILY WITH SUPPER. Patient taking differently: Take 15 mg by mouth daily with supper. 06/22/21   [name], MD    Physical Exam: Vitals:   08/02/21 1030 08/02/21 1045 08/02/21 1115 08/02/21 1227  BP:  (!) 118/93 97/69 116/72 115/66  Pulse: (!) 139 66 (!) 119 (!) 117  Resp: 17 18 (!) 21 20  Temp:      TempSrc:    Oral  SpO2: 96% 97% 96% 100%  Weight:      Height:       General:  Appears calm and comfortable and is in NAD; hip pain with movement Eyes:  PERRLA, EOMI, normal lids, iris °ENT: hard of hearing,  grossly normal lips & tongue, mmm °Neck:  no LAD, masses or thyromegaly °Cardiovascular:  RRR, no m/r/g. No LE edema.  °Respiratory:   CTA bilaterally with no wheezes/rales/rhonchi.  Normal respiratory effort.   °Abdomen:  soft, NT, ND, NABS °Skin:  no rash or induration seen on limited exam °Musculoskeletal:  L leg is very rotated °Psychiatric:  grossly normal mood and affect, speech fluent and appropriate, A&O x 3 °Neurologic:  CN 2-12 grossly intact, moves all extremities in coordinated fashion other than LLE ° ° °Radiological Exams on Admission: °Independently reviewed - see discussion in A/P where applicable ° °DG Chest 2 View ° °Result Date: 08/02/2021 °CLINICAL DATA:  Acute fall with hip pain and chest discomfort. Initial encounter. EXAM: CHEST - 2 VIEW COMPARISON:  06/22/2021 and studies FINDINGS: Cardiomegaly and mild chronic interstitial opacities are again noted. There is no evidence of focal airspace disease, pulmonary edema, suspicious pulmonary nodule/mass, pleural effusion, or pneumothorax. No acute bony abnormalities are identified. Multiple remote bilateral rib fractures are again noted as well as RIGHT shoulder arthroplasty changes. A severe apex RIGHT LOWER thoracic scoliosis is again noted. IMPRESSION: Cardiomegaly without evidence of acute cardiopulmonary disease. Electronically Signed   By: Jeffrey  Hu M.D.   On: 08/02/2021 09:44  ° °CT Head Wo Contrast ° °Result Date: 08/02/2021 °CLINICAL DATA:  Level 2 fall on blood thinners EXAM: CT HEAD WITHOUT CONTRAST CT CERVICAL SPINE WITHOUT CONTRAST TECHNIQUE: Multidetector CT imaging of the head and cervical spine was performed following  the standard protocol without intravenous contrast. Multiplanar CT image reconstructions of the cervical spine were also generated. RADIATION DOSE REDUCTION: This exam was performed according to the departmental dose-optimization program which includes automated exposure control, adjustment of the mA and/or kV according to patient size and/or use of iterative reconstruction technique. COMPARISON:  05/02/2021 FINDINGS: CT HEAD FINDINGS Brain: No evidence of acute infarction, hemorrhage, hydrocephalus, extra-axial collection or mass lesion/mass effect. Generalized atrophy Vascular: No hyperdense vessel or unexpected calcification. Skull: No acute fracture Sinuses/Orbits: No visible injury.  Bilateral cataract resection CT CERVICAL SPINE FINDINGS Alignment: Normal. Skull base and vertebrae: No acute fracture. No primary bone lesion or focal pathologic process. Soft tissues and spinal canal: No prevertebral fluid or swelling. No visible canal hematoma. Mild fat haziness between the intrinsic neck muscles, stable from prior. No convincing strain. Disc levels: Generalized disc space narrowing and degenerative spurring. Upper chest: No acute finding IMPRESSION: No evidence of acute intracranial injury or cervical spine fracture. Electronically Signed   By: Jonathan  Watts M.D.   On: 08/02/2021 09:25  ° °CT Cervical Spine Wo Contrast ° °Result Date: 08/02/2021 °CLINICAL DATA:  Level 2 fall on blood thinners EXAM: CT HEAD WITHOUT CONTRAST CT CERVICAL SPINE WITHOUT CONTRAST TECHNIQUE: Multidetector CT imaging of the head and cervical spine was performed following the standard protocol without intravenous contrast. Multiplanar CT image reconstructions of the cervical spine were also generated. RADIATION DOSE REDUCTION: This exam was performed according to the departmental dose-optimization program which includes automated exposure control, adjustment of the mA and/or kV according to patient size and/or use of iterative  reconstruction technique. COMPARISON:  05/02/2021 FINDINGS: CT HEAD FINDINGS Brain: No evidence of acute infarction, hemorrhage, hydrocephalus, extra-axial collection or mass lesion/mass effect. Generalized atrophy Vascular: No hyperdense vessel or unexpected calcification. Skull: No acute fracture Sinuses/Orbits: No visible injury.  Bilateral cataract resection CT CERVICAL SPINE FINDINGS Alignment: Normal. Skull base and vertebrae: No acute fracture. No   primary bone lesion or focal pathologic process. Soft tissues and spinal canal: No prevertebral fluid or swelling. No visible canal hematoma. Mild fat haziness between the intrinsic neck muscles, stable from prior. No convincing strain. Disc levels: Generalized disc space narrowing and degenerative spurring. Upper chest: No acute finding IMPRESSION: No evidence of acute intracranial injury or cervical spine fracture. Electronically Signed   By: Jonathan  Watts M.D.   On: 08/02/2021 09:25  ° °DG HIP UNILAT WITH PELVIS 2-3 VIEWS LEFT ° °Result Date: 08/02/2021 °CLINICAL DATA:  Acute LEFT hip pain following fall. Initial encounter. EXAM: DG HIP (WITH OR WITHOUT PELVIS) 2-3V LEFT COMPARISON:  03/15/2017 FINDINGS: A fracture of the proximal LEFT femoral diaphysis is noted with shortening of at least 4 cm. There is no evidence of femoral head dislocation. Remote bilateral pubic rami fractures are again noted as well as ORIF hardware within the proximal RIGHT femur. IMPRESSION: Displaced fracture of the proximal LEFT femoral diaphysis with shortening of at least 4 cm. Electronically Signed   By: Jeffrey  Hu M.D.   On: 08/02/2021 09:49  ° °DG HIP UNILAT WITH PELVIS 2-3 VIEWS RIGHT ° °Result Date: 08/02/2021 °CLINICAL DATA:  Acute pelvic and hip pain following fall. Initial encounter. EXAM: DG HIP (WITH OR WITHOUT PELVIS) 2-3V RIGHT COMPARISON:  None. FINDINGS: No acute RIGHT hip fracture or dislocation is identified. Remote fractures in the proximal RIGHT femoral diaphysis  and bilateral pubic rami are noted. Intramedullary nail/screw noted within the RIGHT femur. IMPRESSION: 1. No evidence of acute abnormality. 2. Remote fractures of the RIGHT femur and bilateral pubic rami. Electronically Signed   By: Jeffrey  Hu M.D.   On: 08/02/2021 09:50  ° °DG FEMUR PORT MIN 2 VIEWS LEFT ° °Result Date: 08/02/2021 °CLINICAL DATA:  Left hip fracture after a fall EXAM: LEFT FEMUR PORTABLE 2 VIEWS COMPARISON:  Hip radiographs of same date FINDINGS: Vascular calcifications. Comminuted intertrochanteric left femur fracture, as on dedicated hip radiographs. Extensive override and medial displacement of distal fracture fragments. No distal femur fracture identified. IMPRESSION: Comminuted intertrochanteric proximal left femur fracture, as before. Electronically Signed   By: Kyle  Talbot M.D.   On: 08/02/2021 10:54   ° °EKG: Independently reviewed.  Afib with rate 120; nonspecific ST changes with no evidence of acute ischemia ° ° °Labs on Admission: I have personally reviewed the available labs and imaging studies at the time of the admission. ° °Pertinent labs:   ° °Glucose 299 °BUN 25/Creatinine 0.9/GFR 58 - stable °WBC 11 °INR 1.5 ° ° ° °Assessment and Plan: °* Hip fracture (HCC) °-Apparently mechanical fall resulting in hip fracture °-Orthopedics consulted °-NPO in anticipation of surgical repair °-SCDs overnight, start Lovenox post-operatively (or as per ortho) °-Pain control with Tylenol, Robaxin, Oxycodone prn °-TOC team consult for rehab placement °-Will need PT consult post-operatively °-Hip fracture order set utilized °-TXA per orthopedics °-Fascia iliacus block ordered per anesthesia ° °DNR (do not resuscitate) °-I have discussed code status with the patient and she would not desire resuscitation and would prefer to die a natural death should that situation arise. °-She will need a gold out of facility DNR form at the time of discharge ° °Chronic combined systolic and diastolic heart failure  (HCC) °-recent echo with preserved EF, grade 2 diastolic dysfunction °-Will monitor for evidence of volume overload ° °Fall at home, initial encounter °-Appears to be a mechanical fall °-She reports that she felt like the hip gave way prior to the fall °-Needs consideration of osteoporosis therapy   post-operatively °-No other obvious injuries currently ° °Hyperlipidemia °-last LDL was 70  °-She does not appear to be taking medications for this issue at this time ° °Persistent atrial fibrillation (HCC) °-Patient with chronic afib  °-Current HR in 110 range °-Continue home Cardizem and Toprol XL °-Hold Xarelto due to hip fracture/surgery ° °Essential hypertension °-Continue diltiazem, metoprolol ° °DM type 2, controlled, with complication (HCC) °-recent A1c was 8.6 °-While this is suboptimal, realistically this is probably appropriate given the low likelihood that she will suffer long-term effects from uncontrolled hyperglycemia °-Hold metformin °-She is on 50/50 insulin at home; will hold and cover with sensitive-scale SSI ° ° ° ° ° ° °Advance Care Planning:   Code Status: DNR  ° °Consults: Orthopedics; TOC team; nutrition; will need PT post-operatively ° °DVT Prophylaxis: SCDs; Lovenox/Xarelto when ok with orthopedics ° °Family Communication: EDP and surgeon were both communicating with her son today so the patient did not request that I call ° °Severity of Illness: °The appropriate patient status for this patient is INPATIENT. Inpatient status is judged to be reasonable and necessary in order to provide the required intensity of service to ensure the patient's safety. The patient's presenting symptoms, physical exam findings, and initial radiographic and laboratory data in the context of their chronic comorbidities is felt to place them at high risk for further clinical deterioration. Furthermore, it is not anticipated that the patient will be medically stable for discharge from the hospital within 2 midnights of  admission.  ° °* I certify that at the point of admission it is my clinical judgment that the patient will require inpatient hospital care spanning beyond 2 midnights from the point of admission due to high intensity of service, high risk for further deterioration and high frequency of surveillance required.* ° °Author: ° , MD °08/02/2021 2:00 PM ° °For on call review [email or web address].  °

## 2021-08-02 NOTE — ED Notes (Signed)
Patient transported to CT with this RN 

## 2021-08-02 NOTE — Discharge Instructions (Signed)
Diet: As you were doing prior to hospitalization   Shower:  May shower but keep the wounds dry, use an occlusive plastic wrap, NO SOAKING IN TUB.  If the bandage gets wet, change with a clean dry gauze.  If you have a splint on, leave the splint in place and keep the splint dry with a plastic bag.  Dressing:  You may change your dressing 3-5 days after surgery, unless you have a splint.  If the dressing remains clean and dry it can also be left on until follow up. If you change the dressing replace with clean gauze and tape or ace wrap. If you have a splint, then just leave the splint in place and we will change your bandages during your first follow-up appointment.  If water gets in the splint or the splint gets saturated please call the clinic and we can see you to change your splint.  If you had hand or foot surgery, we will plan to remove your stitches in about 2 weeks in the office.  For all other surgeries, there are sticky tapes (steri-strips) on your wounds and all the stitches are absorbable.  Leave the steri-strips in place when changing your dressings, they will peel off with time, usually 2-3 weeks.  Activity:  Increase activity slowly as tolerated, but follow the weight bearing instructions below.  The rules on driving is that you can not be taking narcotics while you drive, and you must feel in control of the vehicle.    Weight Bearing:   weight bearing as tolerated left lower extremity, with walker.    Blood clot prevention (DVT Prophylaxis): Resume xarelto '10mg'$  post op day 1-2, then resume xarelto '20mg'$  starting post op day 3.  To prevent constipation: you may use a stool softener such as -  Colace (over the counter) 100 mg by mouth twice a day  Drink plenty of fluids (prune juice may be helpful) and high fiber foods Miralax (over the counter) for constipation as needed.    Itching:  If you experience itching with your medications, try taking only a single pain pill, or even half a  pain pill at a time.  You may take up to 10 pain pills per day, and you can also use benadryl over the counter for itching or also to help with sleep.   Precautions:  If you experience chest pain or shortness of breath - call 911 immediately for transfer to the hospital emergency department!!   Call office [phone]) for the following: Temperature greater than 101F Persistent nausea and vomiting Severe uncontrolled pain Redness, tenderness, or signs of infection (pain, swelling, redness, odor or green/yellow discharge around the site) Difficulty breathing, headache or visual disturbances Hives Persistent dizziness or light-headedness Extreme fatigue Any other questions or concerns you may have after discharge  In an emergency, call 911 or go to an Emergency Department at a nearby hospital  Follow- Up Appointment:  Please call for an appointment to be seen approximately 2-3 week after surgery in [facility] with your surgeon Dr. Charlies Constable - [phone] Address: [address] [place], [place], [place] [address]

## 2021-08-02 NOTE — Anesthesia Postprocedure Evaluation (Signed)
Anesthesia Post Note ? ?Patient: Tara Bradley ? ?Procedure(s) Performed: INTRAMEDULLARY (IM) NAIL INTERTROCHANTRIC (Left: Leg Upper) ? ?  ? ?Patient location during evaluation: PACU ?Anesthesia Type: General ?Level of consciousness: awake ?Pain management: pain level controlled ?Vital Signs Assessment: post-procedure vital signs reviewed and stable ?Cardiovascular status: stable ?Postop Assessment: no apparent nausea or vomiting ?Anesthetic complications: no ? ? ?No notable events documented. ? ?Last Vitals:  ?Vitals:  ? 08/02/21 1624 08/02/21 1636  ?BP: 97/65 93/63  ?Pulse:  76  ?Resp:  14  ?Temp: 36.6 ?C   ?SpO2:  99%  ?  ?Last Pain:  ?Vitals:  ? 08/02/21 1636  ?TempSrc:   ?PainSc: Asleep  ? ? ?  ?  ?  ?  ?  ?  ? ?Nyajah Hyson ? ? ? ? ?

## 2021-08-02 NOTE — ED Triage Notes (Signed)
Pt BIB [facility] for Level II fall on thinners. Pt coming from home, had a ground level fall this AM, states "hip went out". Pt was found laying on L side. Pt did hit head, no obvious injury, denies LOC. Pt endorses L hip pain, worse on palpation. EMS endorses shortening and rotation of L hip, pelvic binder placed by EMS. Pt given 136mg fentanyl by EMS. C-collar in place. 20g R hand.  ? ?EMS VS- 130/96, HR 110, RR 16, CBG 307, Spo2 97% 2L ?

## 2021-08-02 NOTE — Anesthesia Preprocedure Evaluation (Addendum)
Anesthesia Evaluation  ?Patient identified by MRN, date of birth, ID band ?Patient awake ? ? ? ?Reviewed: ?Allergy & Precautions, NPO status , Patient's Chart, lab work & pertinent test results ? ?Airway ?Mallampati: II ? ?TM Distance: >3 FB ? ? ? ? Dental ?  ?Pulmonary ?shortness of breath, pneumonia,  ?  ?breath sounds clear to auscultation ? ? ? ? ? ? Cardiovascular ?hypertension, +CHF  ?+ dysrhythmias  ?Rhythm:Regular Rate:Normal ? ? ?  ?Neuro/Psych ?  ? GI/Hepatic ?negative GI ROS, Neg liver ROS,   ?Endo/Other  ?diabetes ? Renal/GU ?  ? ?  ?Musculoskeletal ? ?(+) Arthritis ,  ? Abdominal ?  ?Peds ? Hematology ?  ?Anesthesia Other Findings ? ? Reproductive/Obstetrics ? ?  ? ? ? ? ? ? ? ? ? ? ? ? ? ?  ?  ? ? ? ? ? ? ? ? ?Anesthesia Physical ?Anesthesia Plan ? ?ASA: 3 ? ?Anesthesia Plan: General  ? ?Post-op Pain Management:   ? ?Induction: Intravenous ? ?PONV Risk Score and Plan: 3 and Ondansetron ? ?Airway Management Planned:  ? ?Additional Equipment:  ? ?Intra-op Plan:  ? ?Post-operative Plan: Possible Post-op intubation/ventilation ? ?Informed Consent: I have reviewed the patients History and Physical, chart, labs and discussed the procedure including the risks, benefits and alternatives for the proposed anesthesia with the patient or authorized representative who has indicated his/her understanding and acceptance.  ? ? ? ?Dental advisory given ? ?Plan Discussed with: CRNA and Anesthesiologist ? ?Anesthesia Plan Comments:   ? ? ? ? ? ? ?Anesthesia Quick Evaluation ? ?

## 2021-08-02 NOTE — ED Provider Notes (Signed)
[facility] EMERGENCY DEPARTMENT Provider Note   CSN: [id number] Arrival date & time: 08/02/21  0831     History  Chief Complaint  Patient presents with   Fall    Level II fall on thinners    [facility] is a 88 y.o. female.  Presented to the ER after fall.  Patient states that she had a ground-level fall, landed on her hip and head.  States that she is primarily having pain in her left hip.  Cannot walk due to pain.  Pain was up to 10 out of 10 but is markedly improved after receiving fentanyl.  Also having a little bit of pain in her right hip.  No pain in her head.  No LOC.  Takes Xarelto for A-fib.  Additional history was obtained from her nephew, listed as emergency contact.  Patient's daughter lives in [place].  He was not aware of the fall today.  States that she lives independently.  HPI     Home Medications Prior to Admission medications   Medication Sig Start Date End Date Taking? Authorizing Provider  metoprolol succinate (TOPROL-XL) 100 MG 24 hr tablet TAKE ONE TABLET BY MOUTH TWICE DAILY. TAKE WITH OR IMMEDIATELY FOLLOWING A MEAL 07/29/21   [name], MD  BD PEN NEEDLE NANO U/F 32G X 4 MM MISC Inject into the skin daily. 11/25/20   [provider]  Calcium Carbonate-Vitamin D 600-400 MG-UNIT tablet Take 1 tablet by mouth daily.    [provider]  Cholecalciferol (VITAMIN D) 2000 UNITS tablet Take 1,000 Units by mouth daily.    [provider]  diltiazem (CARDIZEM CD) 360 MG 24 hr capsule Take 1 capsule (360 mg total) by mouth daily. 05/12/21   Shelly Coss, MD  furosemide (LASIX) 40 MG tablet TAKE ONE TABLET BY MOUTH DAILY 07/29/21   [name], MD  Magnesium Oxide 250 MG TABS Take 250 mg by mouth daily.    [provider]  metFORMIN (GLUMETZA) 500 MG (MOD) 24 hr tablet Take 1,000 mg by mouth every evening.    [provider]  multivitamin-lutein (OCUVITE-LUTEIN) CAPS capsule Take 1 capsule  by mouth daily.    [provider]  polyethylene glycol (MIRALAX / GLYCOLAX) 17 g packet Take 17 g by mouth daily. 05/12/21   Shelly Coss, MD  potassium chloride (K-DUR) 10 MEQ tablet Take 10 mEq by mouth daily.  03/25/15   [provider]  XARELTO 15 MG TABS tablet TAKE 1 TABLET ONCE DAILY WITH SUPPER. Patient taking differently: Take 15 mg by mouth daily with supper. 06/22/21   [name], MD      Allergies    Ambien [zolpidem tartrate], Ambien [zolpidem], Codeine, Codeine sulfate, Dulaglutide, Latex, Mirtazapine, and Morphine sulfate    Review of Systems   Review of Systems  Constitutional:  Negative for chills and fever.  HENT:  Negative for ear pain and sore throat.   Eyes:  Negative for pain and visual disturbance.  Respiratory:  Negative for cough and shortness of breath.   Cardiovascular:  Negative for chest pain and palpitations.  Gastrointestinal:  Negative for abdominal pain and vomiting.  Genitourinary:  Negative for dysuria and hematuria.  Musculoskeletal:  Positive for arthralgias. Negative for back pain.  Skin:  Negative for color change and rash.  Neurological:  Negative for seizures and syncope.  All other systems reviewed and are negative.  Physical Exam Updated Vital Signs BP 121/76    Pulse 94  Temp 97.6 F (36.4 C) (Oral)    Resp 16    Ht 5' 5.5" (1.664 m)    Wt 61.2 kg    SpO2 97%    BMI 22.12 kg/m  Physical Exam Vitals and nursing note reviewed.  Constitutional:      General: She is not in acute distress.    Appearance: She is well-developed.  HENT:     Head: Normocephalic and atraumatic.  Eyes:     Conjunctiva/sclera: Conjunctivae normal.  Cardiovascular:     Rate and Rhythm: Normal rate and regular rhythm.     Heart sounds: No murmur heard. Pulmonary:     Effort: Pulmonary effort is normal. No respiratory distress.     Breath sounds: Normal breath sounds.  Abdominal:     Palpations: Abdomen is soft.     Tenderness:  There is no abdominal tenderness.  Musculoskeletal:     Cervical back: Neck supple.     Comments:  RUE: no TTP throughout, no deformity, normal joint ROM, radial pulse intact, distal sensation and motor intact LUE: no TTP throughout, no deformity, normal joint ROM, radial pulse intact, distal sensation and motor intact RLE:  no TTP throughout, no deformity, normal joint ROM, distal pulse, sensation and motor intact LLE: Obvious tenderness, swelling to left hip region, distal pulse, sensation and motor intact  Skin:    General: Skin is warm and dry.     Capillary Refill: Capillary refill takes less than 2 seconds.  Neurological:     Mental Status: She is alert.  Psychiatric:        Mood and Affect: Mood normal.    ED Results / Procedures / Treatments   Labs (all labs ordered are listed, but only abnormal results are displayed) Labs Reviewed  CBC WITH DIFFERENTIAL/PLATELET - Abnormal; Notable for the following components:      Result Value   WBC 11.0 (*)    Neutro Abs 8.4 (*)    All other components within normal limits  BASIC METABOLIC PANEL - Abnormal; Notable for the following components:   Sodium 133 (*)    Chloride 96 (*)    Glucose, Bld 299 (*)    BUN 25 (*)    GFR, Estimated 58 (*)    All other components within normal limits  PROTIME-INR - Abnormal; Notable for the following components:   Prothrombin Time 18.4 (*)    INR 1.5 (*)    All other components within normal limits    EKG EKG Interpretation  Date/Time:  Sunday August 02 2021 08:47:11 EDT Ventricular Rate:  120 PR Interval:    QRS Duration: 85 QT Interval:  325 QTC Calculation: 460 R Axis:   -22 Text Interpretation: Atrial fibrillation Ventricular premature complex Probable anterior infarct, age indeterminate Confirmed by Madalyn Rob [id number]) on 08/02/2021 8:55:28 AM  Radiology DG Chest 2 View  Result Date: 08/02/2021 CLINICAL DATA:  Acute fall with hip pain and chest discomfort. Initial encounter.  EXAM: CHEST - 2 VIEW COMPARISON:  06/22/2021 and studies FINDINGS: Cardiomegaly and mild chronic interstitial opacities are again noted. There is no evidence of focal airspace disease, pulmonary edema, suspicious pulmonary nodule/mass, pleural effusion, or pneumothorax. No acute bony abnormalities are identified. Multiple remote bilateral rib fractures are again noted as well as RIGHT shoulder arthroplasty changes. A severe apex RIGHT LOWER thoracic scoliosis is again noted. IMPRESSION: Cardiomegaly without evidence of acute cardiopulmonary disease. Electronically Signed   By: Margarette Canada M.D.   On: 08/02/2021 09:44  CT Head Wo Contrast  Result Date: 08/02/2021 CLINICAL DATA:  Level 2 fall on blood thinners EXAM: CT HEAD WITHOUT CONTRAST CT CERVICAL SPINE WITHOUT CONTRAST TECHNIQUE: Multidetector CT imaging of the head and cervical spine was performed following the standard protocol without intravenous contrast. Multiplanar CT image reconstructions of the cervical spine were also generated. RADIATION DOSE REDUCTION: This exam was performed according to the departmental dose-optimization program which includes automated exposure control, adjustment of the mA and/or kV according to patient size and/or use of iterative reconstruction technique. COMPARISON:  05/02/2021 FINDINGS: CT HEAD FINDINGS Brain: No evidence of acute infarction, hemorrhage, hydrocephalus, extra-axial collection or mass lesion/mass effect. Generalized atrophy Vascular: No hyperdense vessel or unexpected calcification. Skull: No acute fracture Sinuses/Orbits: No visible injury.  Bilateral cataract resection CT CERVICAL SPINE FINDINGS Alignment: Normal. Skull base and vertebrae: No acute fracture. No primary bone lesion or focal pathologic process. Soft tissues and spinal canal: No prevertebral fluid or swelling. No visible canal hematoma. Mild fat haziness between the intrinsic neck muscles, stable from prior. No convincing strain. Disc levels:  Generalized disc space narrowing and degenerative spurring. Upper chest: No acute finding IMPRESSION: No evidence of acute intracranial injury or cervical spine fracture. Electronically Signed   By: Jorje Guild M.D.   On: 08/02/2021 09:25   CT Cervical Spine Wo Contrast  Result Date: 08/02/2021 CLINICAL DATA:  Level 2 fall on blood thinners EXAM: CT HEAD WITHOUT CONTRAST CT CERVICAL SPINE WITHOUT CONTRAST TECHNIQUE: Multidetector CT imaging of the head and cervical spine was performed following the standard protocol without intravenous contrast. Multiplanar CT image reconstructions of the cervical spine were also generated. RADIATION DOSE REDUCTION: This exam was performed according to the departmental dose-optimization program which includes automated exposure control, adjustment of the mA and/or kV according to patient size and/or use of iterative reconstruction technique. COMPARISON:  05/02/2021 FINDINGS: CT HEAD FINDINGS Brain: No evidence of acute infarction, hemorrhage, hydrocephalus, extra-axial collection or mass lesion/mass effect. Generalized atrophy Vascular: No hyperdense vessel or unexpected calcification. Skull: No acute fracture Sinuses/Orbits: No visible injury.  Bilateral cataract resection CT CERVICAL SPINE FINDINGS Alignment: Normal. Skull base and vertebrae: No acute fracture. No primary bone lesion or focal pathologic process. Soft tissues and spinal canal: No prevertebral fluid or swelling. No visible canal hematoma. Mild fat haziness between the intrinsic neck muscles, stable from prior. No convincing strain. Disc levels: Generalized disc space narrowing and degenerative spurring. Upper chest: No acute finding IMPRESSION: No evidence of acute intracranial injury or cervical spine fracture. Electronically Signed   By: Jorje Guild M.D.   On: 08/02/2021 09:25   DG HIP UNILAT WITH PELVIS 2-3 VIEWS LEFT  Result Date: 08/02/2021 CLINICAL DATA:  Acute LEFT hip pain following fall.  Initial encounter. EXAM: DG HIP (WITH OR WITHOUT PELVIS) 2-3V LEFT COMPARISON:  03/15/2017 FINDINGS: A fracture of the proximal LEFT femoral diaphysis is noted with shortening of at least 4 cm. There is no evidence of femoral head dislocation. Remote bilateral pubic rami fractures are again noted as well as ORIF hardware within the proximal RIGHT femur. IMPRESSION: Displaced fracture of the proximal LEFT femoral diaphysis with shortening of at least 4 cm. Electronically Signed   By: Margarette Canada M.D.   On: 08/02/2021 09:49   DG HIP UNILAT WITH PELVIS 2-3 VIEWS RIGHT  Result Date: 08/02/2021 CLINICAL DATA:  Acute pelvic and hip pain following fall. Initial encounter. EXAM: DG HIP (WITH OR WITHOUT PELVIS) 2-3V RIGHT COMPARISON:  None. FINDINGS: No acute RIGHT  hip fracture or dislocation is identified. Remote fractures in the proximal RIGHT femoral diaphysis and bilateral pubic rami are noted. Intramedullary nail/screw noted within the RIGHT femur. IMPRESSION: 1. No evidence of acute abnormality. 2. Remote fractures of the RIGHT femur and bilateral pubic rami. Electronically Signed   By: Margarette Canada M.D.   On: 08/02/2021 09:50    Procedures Procedures    Medications Ordered in ED Medications  fentaNYL (SUBLIMAZE) injection 50 mcg (50 mcg Intravenous Given 08/02/21 0934)    ED Course/ Medical Decision Making/ A&P                           Medical Decision Making Amount and/or Complexity of Data Reviewed Labs: ordered. Radiology: ordered.  Risk Prescription drug management.   88 year old lady with prior history of multiple medical issues including history of atrial fibrillation on Xarelto presenting to ER after ground-level fall.  She denies LOC, alert on arrival to ER.  No obvious trauma to head but did report head trauma.  Does have tenderness to left hip.  CT head, C-spine negative.  Hip x-ray concerning for displaced, proximal femoral fracture with significant shortening.  I independently  reviewed CT and XR imaging.  Her basic labs are stable.  Patient recently had shoulder surgery with [facility] per my review of chart.  Will discuss with their Ortho on-call today.  I discussed with Dr. Maxie Better.  He requests that the unassigned Ortho provider take care of patient today.  I discussed with Marchiwany who states he would happy to care for patient today.  Will take to the OR either today or tomorrow.  Will admit to hospitalist.  Additional history obtained from discussion with family, chart review.  Nephew updated via phone.        Final Clinical Impression(s) / ED Diagnoses Final diagnoses:  Closed fracture of left femur, unspecified fracture morphology, unspecified portion of femur, initial encounter ([place])  Atrial fibrillation, unspecified type [facility])    Rx / DC Orders ED Discharge Orders     None         Lucrezia Starch, MD 08/02/21 1044

## 2021-08-02 NOTE — Progress Notes (Signed)
Orthopedic Tech Progress Note ?Patient Details:  ?[facility] ?1922-07-10 ?[id number] ? ?Patient ID: [name], female   DOB: [birth year], 88 y.o.   MRN: [id number] ?Level 2 trauma not needed. ? ?[name] ?08/02/2021, 8:38 AM ? ?

## 2021-08-02 NOTE — Assessment & Plan Note (Addendum)
-  Apparently mechanical fall resulting in hip fracture -S/P intramedullary nailing -Pain management -TOC team consult for rehab placement -PT/OT

## 2021-08-02 NOTE — Assessment & Plan Note (Addendum)
-  recent A1c was 8.6 ?-Take metformin at home and also insulin ?-Cover with sensitive-scale SSI. ?

## 2021-08-02 NOTE — Consult Note (Signed)
ORTHOPAEDIC CONSULTATION ? ?REQUESTING PHYSICIAN: Karmen Bongo, MD ? ?Chief Complaint: Left hip fracture ? ?HPI: ?[facility] is a 88 y.o. female who had a ground-level fall at home when she states her left hip when out.  She was unable to ambulate brought to the emergency room.  She was found to have a left intertrochanteric femur fracture.  She has history of prior right subtrochanteric femur fracture as well as a right shoulder fracture.  She denies distal numbness and tingling.  She denies pain in other joints or extremities. ? ?Past Medical History:  ?Diagnosis Date  ? Aortic valve sclerosis   ? Echo, 2008  ? Arthritis   ? Bradycardia   ? October, 2012  ? Cancer [facility])   ? Chronic insomnia   ? Closed fracture of unspecified part of femur 2005  ? Diabetes mellitus, type 2 ([place])   ? Diverticulitis   ? Diverticulosis   ? Ejection fraction   ? EF 60%, echo, February, 2008  //   EF 65-70%, echo, November, 2012  ? Hyperlipidemia   ? Hypertension   ? Long term (current) use of anticoagulants   ? Osteopenia   ? Osteoporosis   ? femur fracture 2005, pelvic fracture 2006  ? Persistent atrial fibrillation ([place])   ? Personal history of malignant neoplasm of breast   ? Syncope   ? Thoracic aortic aneurysm 02/2017  ? Unspecified closed fracture of pelvis 2006  ? ?Past Surgical History:  ?Procedure Laterality Date  ? CARDIOVERSION N/A 08/09/2012  ? Procedure: CARDIOVERSION;  Surgeon: Deboraha Sprang, MD;  Location: [place];  Service: Cardiovascular;  Laterality: N/A;  ? EYE SURGERY    ? FEMUR SURGERY  2005  ? ORIF  ? IR ANGIOGRAM PELVIS SELECTIVE OR SUPRASELECTIVE  03/11/2017  ? IR ANGIOGRAM SELECTIVE EACH ADDITIONAL VESSEL  03/11/2017  ? IR EMBO ART  VEN HEMORR LYMPH EXTRAV  INC GUIDE ROADMAPPING  03/11/2017  ? IR FLUORO GUIDE CV LINE RIGHT  03/11/2017  ? IR US GUIDE VASC ACCESS RIGHT  03/11/2017  ? IR US GUIDE VASC ACCESS RIGHT  03/11/2017  ? MASTECTOMY  1995  ? left  ? MINOR HARDWARE REMOVAL Left 09/01/2020  ?  Procedure: REMOVAL OF HARDWARE LEFT FOREARM;  Surgeon: Milly Jakob, MD;  Location: [place];  Service: Orthopedics;  Laterality: Left;  ? ORIF ULNAR FRACTURE Left 10/19/2019  ? Procedure: OPEN REDUCTION INTERNAL FIXATION (ORIF)  LEFT DISTAL RADIUS  AND ULNA FRACTURE;  Surgeon: Milly Jakob, MD;  Location: [place];  Service: Orthopedics;  Laterality: Left;  ? REVERSE SHOULDER ARTHROPLASTY Right 05/04/2021  ? Procedure: REVERSE SHOULDER ARTHROPLASTY;  Surgeon: Netta Cedars, MD;  Location: [place];  Service: Orthopedics;  Laterality: Right;  ? SHOULDER SURGERY    ? left  ? [place], 2003  ? TONSILLECTOMY    ? TOTAL ABDOMINAL HYSTERECTOMY W/ BILATERAL SALPINGOOPHORECTOMY  1995  ? WRIST SURGERY    ?  x 2   ? ?Social History  ? ?Socioeconomic History  ? Marital status: Widowed  ?  Spouse name: Not on file  ? Number of children: 2  ? Years of education: Not on file  ? Highest education level: Not on file  ?Occupational History  ? Occupation: English as a second language teacher, retired  ?Tobacco Use  ? Smoking status: Never  ? Smokeless tobacco: Never  ?Vaping Use  ? Vaping Use: Never used  ?Substance and Sexual Activity  ? Alcohol use: No  ? Drug  use: No  ? Sexual activity: Not Currently  ?Other Topics Concern  ? Not on file  ?Social History Narrative  ? Husband had advancing Dementia, which was been very difficult for her. He died Dec 26, 2016.  ? She was scheduled to go into [facility] 11/02.  ? ?Social Determinants of Health  ? ?Financial Resource Strain: Not on file  ?Food Insecurity: Not on file  ?Transportation Needs: Not on file  ?Physical Activity: Not on file  ?Stress: Not on file  ?Social Connections: Not on file  ? ?Family History  ?Problem Relation Age of Onset  ? Congestive Heart Failure Mother 55  ? Heart attack Father 32  ? Diabetes type II Father   ? Diabetes type II Brother   ?     1/2  ? Multiple myeloma Brother   ?     2/2  ? Diabetes type II Son   ? Colon cancer Neg Hx    ? ?Allergies  ?Allergen Reactions  ? Ambien [Zolpidem Tartrate] Other (See Comments)  ?  Hallucinations ?  ? Ambien [Zolpidem] Other (See Comments)  ?  hallucinations  ? Codeine Other (See Comments)  ?  headache ?  ? Codeine Sulfate Other (See Comments)  ?  REACTION: unspecified  ? Dulaglutide Diarrhea  ? Latex Hives  ? Morphine Sulfate Nausea And Vomiting  ? Mirtazapine Palpitations  ? ? ? ?Positive ROS: All other systems have been reviewed and were otherwise negative with the exception of those mentioned in the HPI and as above. ? ?Physical Exam: ?General: Alert, no acute distress ?Cardiovascular: No pedal edema ?Respiratory: No cyanosis, no use of accessory musculature ?Skin: No lesions in the area of chief complaint ?Neurologic: Sensation intact distally ?Psychiatric: Patient is competent for consent with normal mood and affect ? ?MUSCULOSKELETAL:  ?LLE No traumatic wounds, ecchymosis, or rash ? Hip range of motion deferred given no fx ? No swelling about knee or ankle ? No knee or ankle effusion ? Sens DPN, SPN, TN intact ? Motor EHL, ext, flex 5/5 ? DP 2+, No significant edema ? ? ?RLE No traumatic wounds, ecchymosis, or rash ? Nontender ? Well healed incisions about the right leg ? No pain with log roll in the hip, but mild discomfort with hip flexion and internal rotation ? No knee or ankle effusion ? Knee stable to varus/ valgus stress ? Sens DPN, SPN, TN intact ? Motor EHL, ext, flex 5/5 ? DP 2+, No significant edema ? ? ?IMAGING: xrays left hip and pelvis demonstrate displaced intertrochanteric femur fracture with subtrochanteric tension. ?Evidence of prior right subtroches fracture well-healed with retrograde cephalomedullary nail.  Also with old healed pelvic rami fractures. ? ?Assessment: ?Principal Problem: ?  Hip fracture ([place]) ? ? ?Left displaced intertrochanteric femur fracture ? ?Plan: ?Plan to proceed to the OR for open reduction total fixation with cephalomedullary nail. ?Patient has been  n.p.o. since 530 this morning where she had half an orange. ?Patient is on Xarelto for atrial fibrillation.  Last dose was yesterday evening. ?Plan to resume Xarelto postoperatively. ? ?The risks benefits and alternatives were discussed with the patient including but not limited to the risks of nonoperative treatment, versus surgical intervention including infection, bleeding, nerve injury, malunion, nonunion, the need for revision surgery, hardware prominence, hardware failure, the need for hardware removal, blood clots, cardiopulmonary complications, morbidity, mortality, among others, and they were willing to proceed.  Consent was signed by myself and the patient.  Left hip was marked.  ? ? ? ?  Virgina Norfolk Haru Shaff, MD ? ?Contact information:   ?Weekdays 7am-5pm epic message Dr. Zachery Dakins, or call office for patient follow up: [phone] ?After hours and holidays please check [email or web address] for group call information for [facility]  ?

## 2021-08-02 NOTE — Op Note (Addendum)
DATE OF SURGERY:  08/02/2021 ? ?TIME: 3:06 PM ? ?PATIENT NAME:  [name] ? ?AGE: 88 y.o. ? ?PRE-OPERATIVE DIAGNOSIS:  left intertrochanteric hip fracture ? ?POST-OPERATIVE DIAGNOSIS:  SAME ? ?PROCEDURE:  INTRAMEDULLARY (IM) NAIL INTERTROCHANTRIC ? ?SURGEON:  Monicia Tse A Hanadi Stanly ? ?ASSISTANT: Rosario Adie, PA-C, was present and scrubbed throughout the case, critical for assistance with exposure, retraction, instrumentation, and closure. ? ? ?OPERATIVE IMPLANTS:  ?[employer] Tan nail 9 x 443m, 100 x 95 lag compression screw, 2 distal interlocks ? ?Implant Name Type Inv. Item Serial No. Manufacturer Lot No. LRB No. Used Action  ?NAIL TRIGEN [facility] CenterMETA 9.0X40 - [id number] NAIL TRIGEN TArk[facility] Gastro[facility] Endoscopy CenterMETA 9.0X40  [employer] [id number] 1 Implanted  ?SCREW COMP LAG META [id number]- [id number] SCREW COMP LAG META [id number] [employer] [id number] 1 Implanted  ? ? ?ESTIMATED BLOOD LOSS: 250cc ? ?PREOPERATIVE INDICATIONS:  Tara HICEis a 88y.o. year old who fell and suffered an left intertrochanteric hip fracture. She was brought into the ER and then admitted and optimized and then elected for surgical intervention.   ? ?The risks benefits and alternatives were discussed with the patient including but not limited to the risks of nonoperative treatment, versus surgical intervention including infection, bleeding, nerve injury, malunion, nonunion, hardware prominence, hardware failure, need for hardware removal, blood clots, cardiopulmonary complications, morbidity, mortality, among others, and they were willing to proceed.   ? ?OPERATIVE PROCEDURE: ? ?The patient was brought to the operating room and placed in the supine position. Anesthesia was administered. She was placed on the fracture table.  Closed reduction was performed under C-arm guidance.  We also given some right groin pain in the preop holding area obtained a fluoroscopic AP of the right femoral neck which  was reassuring and showed no fracture.  Time out was then performed after sterile prep and drape. She received preoperative antibiotics. ? ?Incision was made proximal to the greater trochanter. A guidepin was placed in the appropriate starting position on the AP just medial to the tip of the greater trochanter, and slightly anterior of center on the lateral x-ray.  An entry reamer was used to open the proximal femur canal.  ? ?A slight bend was applied and the guidewire which was then inserted on the canal of the femur.  AP and lateral of the knee were obtained to make sure we had appropriate length and in a centered position on the lateral for reaming. ? ?We then measured for our nail length which was 400 mm. ? ?We then sequentially reamed the canal starting with a 9 mm reamer up to a 10.5 mm reamer. The patient had notable tight isthmus and was concerned to ream beyond 10.5 as to not fracture the femoral shaft. Elected to use a metatan nail which has a 964mnail available. ? ?The above-named nail was opened. I then placed the nail by hand down.  We used fluoroscopy to confirm positioning and depth at the knee as well as the hip. ? ?A percutaneous incision was made for the guide for the helical blade. A threaded guidepin was placed into the femoral neck and head and fluoroscopy was used to confirm adequate placement with an acceptable tip-apex distance. A drill was used to perforate the lateral cortex and 10992EQelical blade was inserted into the head/neck segment. The set screw was then tightened to set rotation and backed off to allow compression. The construct was  compressed and obtained some further reduction of the fracture. The aiming arm was removed from the nail and position of the helical blade was confirmed with fluoroscopy. The set screw was then placed and fully tightened. ? ? ?Then using a perfect circle technique we placed a two distal interlock screws. ? ?The wounds were irrigated copiously, and  vancomycin powder was placed in the wounds.  The gluteal fascia was closed with #1 Vicryl, and skin was closed with 2-0 Vicryl and 3-0 Nylon.  Sterile dressing was applied with adaptic, 4 x 4 gauze, and Tegaderm.  The patient was awakened and returned to PACU in stable and satisfactory condition. There were no complications and the patient tolerated the procedure well. ? ?Post op recs: ?WB: WBAT LLE ?Abx: ancef x23 hours post op ?Imaging: PACU xrays ?Dressing: keep intact until follow up, change PRN if soiled or saturated. ?DVT prophylaxis: xarelto '10mg'$  POD1-2, then resume xarelto '20mg'$  POD3 ?Follow up: 2 weeks after surgery for a wound check with Dr. Zachery Dakins at [facility].  ?Address: [address] [place], [place], [place] [address]  ?Office Phone: [phone] ? ?Charlies Constable, MD ?Orthopaedic Surgery  ? ? ? ?

## 2021-08-03 DIAGNOSIS — S72002A Fracture of unspecified part of neck of left femur, initial encounter for closed fracture: Secondary | ICD-10-CM | POA: Diagnosis not present

## 2021-08-03 DIAGNOSIS — D62 Acute posthemorrhagic anemia: Secondary | ICD-10-CM

## 2021-08-03 LAB — CBC
HCT: 23.5 % — ABNORMAL LOW (ref 36.0–46.0)
Hemoglobin: 7.4 g/dL — ABNORMAL LOW (ref 12.0–15.0)
MCH: 29.7 pg (ref 26.0–34.0)
MCHC: 31.5 g/dL (ref 30.0–36.0)
MCV: 94.4 fL (ref 80.0–100.0)
Platelets: 177 10*3/uL (ref 150–400)
RBC: 2.49 MIL/uL — ABNORMAL LOW (ref 3.87–5.11)
RDW: 15.9 % — ABNORMAL HIGH (ref 11.5–15.5)
WBC: 5.9 10*3/uL (ref 4.0–10.5)
nRBC: 0 % (ref 0.0–0.2)

## 2021-08-03 LAB — BASIC METABOLIC PANEL
Anion gap: 7 (ref 5–15)
BUN: 29 mg/dL — ABNORMAL HIGH (ref 8–23)
CO2: 29 mmol/L (ref 22–32)
Calcium: 8.1 mg/dL — ABNORMAL LOW (ref 8.9–10.3)
Chloride: 97 mmol/L — ABNORMAL LOW (ref 98–111)
Creatinine, Ser: 1.04 mg/dL — ABNORMAL HIGH (ref 0.44–1.00)
GFR, Estimated: 49 mL/min — ABNORMAL LOW (ref >60)
Glucose, Bld: 244 mg/dL — ABNORMAL HIGH (ref 70–99)
Potassium: 4.8 mmol/L (ref 3.5–5.1)
Sodium: 133 mmol/L — ABNORMAL LOW (ref 135–145)

## 2021-08-03 LAB — GLUCOSE, CAPILLARY
Glucose-Capillary: 228 mg/dL — ABNORMAL HIGH (ref 70–99)
Glucose-Capillary: 93 mg/dL (ref 70–99)
Glucose-Capillary: 341 mg/dL — ABNORMAL HIGH (ref 70–99)
Glucose-Capillary: 298 mg/dL — ABNORMAL HIGH (ref 70–99)
Glucose-Capillary: 299 mg/dL — ABNORMAL HIGH (ref 70–99)

## 2021-08-03 MED ORDER — INSULIN GLARGINE-YFGN 100 UNIT/ML ~~LOC~~ SOLN
10.0000 [IU] | Freq: Every day | SUBCUTANEOUS | Status: DC
  Administered 2021-08-03: 10 [IU] via SUBCUTANEOUS
  Filled 2021-08-03 (×2): qty 0.1

## 2021-08-03 MED ORDER — SENNA 8.6 MG PO TABS
1.0000 | ORAL_TABLET | Freq: Two times a day (BID) | ORAL | Status: DC
  Administered 2021-08-03 – 2021-08-08 (×10): 8.6 mg via ORAL
  Filled 2021-08-03 (×10): qty 1

## 2021-08-03 MED ORDER — POLYETHYLENE GLYCOL 3350 17 G PO PACK
17.0000 g | PACK | Freq: Every day | ORAL | Status: DC
  Administered 2021-08-03 – 2021-08-08 (×5): 17 g via ORAL
  Filled 2021-08-03 (×6): qty 1

## 2021-08-03 MED ORDER — CHLORHEXIDINE GLUCONATE CLOTH 2 % EX PADS
6.0000 | MEDICATED_PAD | Freq: Every day | CUTANEOUS | Status: DC
  Administered 2021-08-04 – 2021-08-08 (×4): 6 via TOPICAL

## 2021-08-03 NOTE — Progress Notes (Signed)
PROGRESS NOTE  Tara Bradley  LKG:[id number] DOB: Jun 30, 1922 DOA: 08/02/2021 PCP: [name], MD   Brief Narrative: Patient is a 88 year old female with history of diabetes type 2, hypertension, hyperlipidemia, chronic diastolic CHF, A-fib on Xarelto who presented with a fall from home.  Patient lives alone and ambulatory at baseline.  She was recently admitted here for CHF exacerbation/viral pneumonia.  Imagings on presentation showed left intertrochanteric hip fracture.  Orthopedics consulted and she underwent intramedullary nailing on 3/12.  Postoperatively, she became hypotensive and anemic secondary to acute blood loss anemia.   Assessment & Plan:  Principal Problem:   Hip fracture ([place]) Active Problems:   Acute postoperative anemia due to expected blood loss   DM type 2, controlled, with complication (HCC)   Essential hypertension   Persistent atrial fibrillation ([place])   Hyperlipidemia   Fall at home, initial encounter   Chronic combined systolic and diastolic heart failure ([place])   DNR (do not resuscitate)   Assessment and Plan: * Hip fracture ([place]) -Apparently mechanical fall resulting in hip fracture -S/P intramedullary nailing -Pain management -TOC team consult for rehab placement -PT/OT  Acute postoperative anemia due to expected blood loss Hemoglobin dropped from 12 to the range of 7.  Continue monitor H&H.  Transfuse if hemoglobin drops less than 7  DNR (do not resuscitate) Code status is DNR  Chronic combined systolic and diastolic heart failure ([place]) -recent echo with preserved EF, grade 2 diastolic dysfunction -Will monitor for evidence of volume overload  Fall at home, initial encounter -Appears to be a mechanical fall -She reports that she felt like the hip gave way prior to the fall -Needs consideration of osteoporosis therapy post-operatively -No other obvious injuries currently  Hyperlipidemia -last LDL was 70  -She does not appear to be  taking medications for this issue at this time  Persistent atrial fibrillation ([place]) -Patient with chronic afib  -Continue home Cardizem and Toprol XL -On Xarelto for anticoagulation  Essential hypertension -Blood pressure soft but she is asymptomatic -Continue diltiazem, metoprolol for Afib control  DM type 2, controlled, with complication ([place]) -recent A1c was 8.6 -Take metformin at home and also insulin -Cover with sensitive-scale SSI              DVT prophylaxis:rivaroxaban (XARELTO) tablet 10 mg Start: 08/03/21 1000 SCDs Start: 08/02/21 1242 rivaroxaban (XARELTO) tablet 10 mg     Code Status: DNR  Family Communication: None at bedside  Patient status:Inpatient  Patient is from :Home  Anticipated discharge to:SNF  Estimated DC date:2-3 days   Consultants: Orthopedics  Procedures: Intramedullary nailing  Antimicrobials:  Anti-infectives (From admission, onward)    Start     Dose/Rate Route Frequency Ordered Stop   08/03/21 0600  ceFAZolin (ANCEF) IVPB 2g/100 mL premix        2 g 200 mL/hr over 30 Minutes Intravenous On call to O.R. 08/02/21 1217 08/02/21 1335   08/02/21 1509  vancomycin (VANCOCIN) powder  Status:  Discontinued          As needed 08/02/21 1510 08/02/21 1533       Subjective:  Patient seen and examined at the bedside this morning.  She looked comfortable, eating her breakfast.  Denies any significant pain on the surgery site.  Heart rate ranging from 90s to 110s, blood pressure soft but she is asymptomatic.  Objective: Vitals:   08/02/21 1636 08/02/21 1940 08/02/21 2324 08/03/21 0712  BP: 93/63 (!) 80/61 (!) 88/41 (!) 97/48  Pulse: 76 97  (!)  108  Resp: '14 14  17  '$ Temp:  (!) 97.4 F (36.3 C)  99.1 F (37.3 C)  TempSrc:  Oral  Oral  SpO2: 99% 100%  95%  Weight:      Height:        Intake/Output Summary (Last 24 hours) at 08/03/2021 1005 Last data filed at 08/03/2021 0900 Gross per 24 hour  Intake 1670 ml  Output 450 ml   Net 1220 ml   Filed Weights   08/02/21 0843  Weight: 61.2 kg    Examination:  General exam: Overall comfortable, not in distress, very deconditioned elderly pleasant female HEENT: PERRL Respiratory system:  no wheezes or crackles  Cardiovascular system: Irregularly irregular rhythm.  Gastrointestinal system: Abdomen is nondistended, soft and nontender. Central nervous system: Alert and oriented Extremities: No edema, no clubbing ,no cyanosis, surgical wound on the left hip Skin: No rashes, no ulcers,no icterus     Data Reviewed: I have personally reviewed following labs and imaging studies  CBC: Recent Labs  Lab 08/02/21 0839 08/03/21 0401  WBC 11.0* 5.9  NEUTROABS 8.4*  --   HGB 12.3 7.4*  HCT 37.8 23.5*  MCV 93.6 94.4  PLT 251 974   Basic Metabolic Panel: Recent Labs  Lab 08/02/21 0839 08/03/21 0401  NA 133* 133*  K 3.7 4.8  CL 96* 97*  CO2 26 29  GLUCOSE 299* 244*  BUN 25* 29*  CREATININE 0.90 1.04*  CALCIUM 8.9 8.1*     Recent Results (from the past 240 hour(s))  Resp Panel by RT-PCR (Flu A&B, Covid) Nasopharyngeal Swab     Status: None   Collection Time: 08/02/21  8:47 AM   Specimen: Nasopharyngeal Swab; Nasopharyngeal(NP) swabs in vial transport medium  Result Value Ref Range Status   SARS Coronavirus 2 by RT PCR NEGATIVE NEGATIVE Final    Comment: (NOTE) SARS-CoV-2 target nucleic acids are NOT DETECTED.  The SARS-CoV-2 RNA is generally detectable in upper respiratory specimens during the acute phase of infection. The lowest concentration of SARS-CoV-2 viral copies this assay can detect is 138 copies/mL. A negative result does not preclude SARS-Cov-2 infection and should not be used as the sole basis for treatment or other patient management decisions. A negative result may occur with  improper specimen collection/handling, submission of specimen other than nasopharyngeal swab, presence of viral mutation(s) within the areas targeted by this  assay, and inadequate number of viral copies(<138 copies/mL). A negative result must be combined with clinical observations, patient history, and epidemiological information. The expected result is Negative.  Fact Sheet for Patients:  EntrepreneurPulse.com.au  Fact Sheet for Healthcare Providers:  IncredibleEmployment.be  This test is no t yet approved or cleared by the [place] FDA and  has been authorized for detection and/or diagnosis of SARS-CoV-2 by FDA under an Emergency Use Authorization (EUA). This EUA will remain  in effect (meaning this test can be used) for the duration of the COVID-19 declaration under Section 564(b)(1) of the Act, 21 U.S.C.section 360bbb-3(b)(1), unless the authorization is terminated  or revoked sooner.       Influenza A by PCR NEGATIVE NEGATIVE Final   Influenza B by PCR NEGATIVE NEGATIVE Final    Comment: (NOTE) The Xpert Xpress SARS-CoV-2/FLU/RSV plus assay is intended as an aid in the diagnosis of influenza from Nasopharyngeal swab specimens and should not be used as a sole basis for treatment. Nasal washings and aspirates are unacceptable for Xpert Xpress SARS-CoV-2/FLU/RSV testing.  Fact Sheet for Patients: EntrepreneurPulse.com.au  Fact Sheet for Healthcare Providers: IncredibleEmployment.be  This test is not yet approved or cleared by the [place] FDA and has been authorized for detection and/or diagnosis of SARS-CoV-2 by FDA under an Emergency Use Authorization (EUA). This EUA will remain in effect (meaning this test can be used) for the duration of the COVID-19 declaration under Section 564(b)(1) of the Act, 21 U.S.C. section 360bbb-3(b)(1), unless the authorization is terminated or revoked.  Performed at [facility], [place] [address], [place], [place] [address]   Surgical pcr screen     Status: Abnormal   Collection Time: 08/02/21 12:15 PM    Specimen: Nasal Mucosa; Nasal Swab  Result Value Ref Range Status   MRSA, PCR (A) NEGATIVE Final    INVALID, UNABLE TO DETERMINE THE PRESENCE OF TARGET DUE TO SPECIMEN INTEGRITY. RECOLLECTION REQUESTED.    Comment: RESULT CALLED TO, READ BACK BY AND VERIFIED WITH: RN A.MANNING ON [id number] AT 1702 BY E.PARRISH    Staphylococcus aureus (A) NEGATIVE Final    INVALID, UNABLE TO DETERMINE THE PRESENCE OF TARGET DUE TO SPECIMEN INTEGRITY. RECOLLECTION REQUESTED.    Comment: RESULT CALLED TO, READ BACK BY AND VERIFIED WITH: RN A.MANNING ON [id number] AT 1702 BY E.PARRISH Performed at [facility], [place] [address] [place], [place] [address]      Radiology Studies: DG Chest 2 View  Result Date: 08/02/2021 CLINICAL DATA:  Acute fall with hip pain and chest discomfort. Initial encounter. EXAM: CHEST - 2 VIEW COMPARISON:  06/22/2021 and studies FINDINGS: Cardiomegaly and mild chronic interstitial opacities are again noted. There is no evidence of focal airspace disease, pulmonary edema, suspicious pulmonary nodule/mass, pleural effusion, or pneumothorax. No acute bony abnormalities are identified. Multiple remote bilateral rib fractures are again noted as well as RIGHT shoulder arthroplasty changes. A severe apex RIGHT LOWER thoracic scoliosis is again noted. IMPRESSION: Cardiomegaly without evidence of acute cardiopulmonary disease. Electronically Signed   By: Margarette Canada M.D.   On: 08/02/2021 09:44   CT Head Wo Contrast  Result Date: 08/02/2021 CLINICAL DATA:  Level 2 fall on blood thinners EXAM: CT HEAD WITHOUT CONTRAST CT CERVICAL SPINE WITHOUT CONTRAST TECHNIQUE: Multidetector CT imaging of the head and cervical spine was performed following the standard protocol without intravenous contrast. Multiplanar CT image reconstructions of the cervical spine were also generated. RADIATION DOSE REDUCTION: This exam was performed according to the departmental dose-optimization program which includes  automated exposure control, adjustment of the mA and/or kV according to patient size and/or use of iterative reconstruction technique. COMPARISON:  05/02/2021 FINDINGS: CT HEAD FINDINGS Brain: No evidence of acute infarction, hemorrhage, hydrocephalus, extra-axial collection or mass lesion/mass effect. Generalized atrophy Vascular: No hyperdense vessel or unexpected calcification. Skull: No acute fracture Sinuses/Orbits: No visible injury.  Bilateral cataract resection CT CERVICAL SPINE FINDINGS Alignment: Normal. Skull base and vertebrae: No acute fracture. No primary bone lesion or focal pathologic process. Soft tissues and spinal canal: No prevertebral fluid or swelling. No visible canal hematoma. Mild fat haziness between the intrinsic neck muscles, stable from prior. No convincing strain. Disc levels: Generalized disc space narrowing and degenerative spurring. Upper chest: No acute finding IMPRESSION: No evidence of acute intracranial injury or cervical spine fracture. Electronically Signed   By: Jorje Guild M.D.   On: 08/02/2021 09:25   CT Cervical Spine Wo Contrast  Result Date: 08/02/2021 CLINICAL DATA:  Level 2 fall on blood thinners EXAM: CT HEAD WITHOUT CONTRAST CT CERVICAL SPINE WITHOUT CONTRAST TECHNIQUE: Multidetector CT imaging  of the head and cervical spine was performed following the standard protocol without intravenous contrast. Multiplanar CT image reconstructions of the cervical spine were also generated. RADIATION DOSE REDUCTION: This exam was performed according to the departmental dose-optimization program which includes automated exposure control, adjustment of the mA and/or kV according to patient size and/or use of iterative reconstruction technique. COMPARISON:  05/02/2021 FINDINGS: CT HEAD FINDINGS Brain: No evidence of acute infarction, hemorrhage, hydrocephalus, extra-axial collection or mass lesion/mass effect. Generalized atrophy Vascular: No hyperdense vessel or unexpected  calcification. Skull: No acute fracture Sinuses/Orbits: No visible injury.  Bilateral cataract resection CT CERVICAL SPINE FINDINGS Alignment: Normal. Skull base and vertebrae: No acute fracture. No primary bone lesion or focal pathologic process. Soft tissues and spinal canal: No prevertebral fluid or swelling. No visible canal hematoma. Mild fat haziness between the intrinsic neck muscles, stable from prior. No convincing strain. Disc levels: Generalized disc space narrowing and degenerative spurring. Upper chest: No acute finding IMPRESSION: No evidence of acute intracranial injury or cervical spine fracture. Electronically Signed   By: Jorje Guild M.D.   On: 08/02/2021 09:25   DG C-Arm 1-60 Min-No Report  Result Date: 08/02/2021 Fluoroscopy was utilized by the requesting physician.  No radiographic interpretation.   DG C-Arm 1-60 Min-No Report  Result Date: 08/02/2021 Fluoroscopy was utilized by the requesting physician.  No radiographic interpretation.   DG HIP UNILAT WITH PELVIS 2-3 VIEWS LEFT  Result Date: 08/02/2021 CLINICAL DATA:  Acute LEFT hip pain following fall. Initial encounter. EXAM: DG HIP (WITH OR WITHOUT PELVIS) 2-3V LEFT COMPARISON:  03/15/2017 FINDINGS: A fracture of the proximal LEFT femoral diaphysis is noted with shortening of at least 4 cm. There is no evidence of femoral head dislocation. Remote bilateral pubic rami fractures are again noted as well as ORIF hardware within the proximal RIGHT femur. IMPRESSION: Displaced fracture of the proximal LEFT femoral diaphysis with shortening of at least 4 cm. Electronically Signed   By: Margarette Canada M.D.   On: 08/02/2021 09:49   DG HIP UNILAT WITH PELVIS 2-3 VIEWS RIGHT  Result Date: 08/02/2021 CLINICAL DATA:  Acute pelvic and hip pain following fall. Initial encounter. EXAM: DG HIP (WITH OR WITHOUT PELVIS) 2-3V RIGHT COMPARISON:  None. FINDINGS: No acute RIGHT hip fracture or dislocation is identified. Remote fractures in the  proximal RIGHT femoral diaphysis and bilateral pubic rami are noted. Intramedullary nail/screw noted within the RIGHT femur. IMPRESSION: 1. No evidence of acute abnormality. 2. Remote fractures of the RIGHT femur and bilateral pubic rami. Electronically Signed   By: Margarette Canada M.D.   On: 08/02/2021 09:50   DG FEMUR MIN 2 VIEWS LEFT  Result Date: 08/02/2021 CLINICAL DATA:  Left intramedullary nail RSTO EXAM: LEFT FEMUR 2 VIEWS; DG C-ARM 1-60 MIN-NO REPORT COMPARISON:  08/02/2021 FLUOROSCOPY: Fluoroscopy time: 149 seconds Air kerma: 20.51 mGy FINDINGS: Intraoperative fluoroscopic images of the left hip demonstrate intramedullary nail fixation of intratrochanteric fractures. IMPRESSION: Intraoperative fluoroscopic images of the left hip demonstrate intramedullary nail fixation of intratrochanteric fractures. Electronically Signed   By: Delanna Ahmadi M.D.   On: 08/02/2021 15:25   DG FEMUR PORT MIN 2 VIEWS LEFT  Result Date: 08/02/2021 CLINICAL DATA:  Postoperative EXAM: LEFT FEMUR PORTABLE 2 VIEWS COMPARISON:  05/04/2022 FINDINGS: Intramedullary rod fixation of intratrochanteric fractures of the left femur. No perihardware fracture or component malpositioning. Expected overlying postoperative change. Vascular calcinosis. IMPRESSION: Intramedullary rod fixation of intratrochanteric fractures of the left femur. No perihardware fracture or component malpositioning. Electronically  Signed   By: Delanna Ahmadi M.D.   On: 08/02/2021 17:06   DG FEMUR PORT MIN 2 VIEWS LEFT  Result Date: 08/02/2021 CLINICAL DATA:  Left hip fracture after a fall EXAM: LEFT FEMUR PORTABLE 2 VIEWS COMPARISON:  Hip radiographs of same date FINDINGS: Vascular calcifications. Comminuted intertrochanteric left femur fracture, as on dedicated hip radiographs. Extensive override and medial displacement of distal fracture fragments. No distal femur fracture identified. IMPRESSION: Comminuted intertrochanteric proximal left femur fracture, as  before. Electronically Signed   By: Abigail Miyamoto M.D.   On: 08/02/2021 10:54    Scheduled Meds:  sodium chloride   Intravenous Once   chlorhexidine  60 mL Topical Once   diltiazem  360 mg Oral Daily   docusate sodium  100 mg Oral BID   insulin aspart  0-9 Units Subcutaneous TID WC   insulin glargine-yfgn  10 Units Subcutaneous Daily   magnesium oxide  400 mg Oral Daily   metoprolol succinate  100 mg Oral Daily   rivaroxaban  10 mg Oral Daily   Continuous Infusions:  lactated ringers 75 mL/hr at 08/03/21 7416   methocarbamol (ROBAXIN) IV       LOS: 1 day   Shelly Coss, MD [facility] P3/13/2023, 10:05 AM

## 2021-08-03 NOTE — Plan of Care (Signed)
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
## 2021-08-03 NOTE — Evaluation (Signed)
Physical Therapy Evaluation ?Patient Details ?Name: Tara Bradley ?MRN: [id number] ?DOB: 28-Oct-1922 ?Today's Date: 08/03/2021 ? ?History of Present Illness ? Pt is a 88 y/o female who presents s/p fall, sustaining left intertrochanteric hip fracture.  Orthopedics consulted and she underwent intramedullary nailing on 08/02/21. PMH significant for bradycardia, CA, DM II, HTN, osteoporosis, thoracic aortic aneurysm, pelvic fracture 2006, prior ORIF femur fracture (unspecified laterality).  ?Clinical Impression ? Pt admitted with above diagnosis. Pt currently with functional limitations due to the deficits listed below (see PT Problem List). At the time of PT eval pt was able to perform transfers with up to +2 mod assist for balance support, safety, and walker management. Anticipate pt will progress well as pain lessens. As she lives alone, feel she could benefit from continued rehab at the SNF level to maximize functional independence and safety to prepare for return home. Acutely, pt will benefit from skilled PT to increase their independence and safety with mobility to allow discharge to the venue listed below.      ?   ? ?Recommendations for follow up therapy are one component of a multi-disciplinary discharge planning process, led by the attending physician.  Recommendations may be updated based on patient status, additional functional criteria and insurance authorization. ? ?Follow Up Recommendations Skilled nursing-short term rehab (<3 hours/day) ? ?  ?Assistance Recommended at Discharge Frequent or constant Supervision/Assistance  ?Patient can return home with the following ? Two people to help with walking and/or transfers;A lot of help with bathing/dressing/bathroom;Assist for transportation;Assistance with cooking/housework ? ?  ?Equipment Recommendations None recommended by PT  ?Recommendations for Other Services ?    ?  ?Functional Status Assessment Patient has had a recent decline in their functional status  and demonstrates the ability to make significant improvements in function in a reasonable and predictable amount of time.  ? ?  ?Precautions / Restrictions Precautions ?Precautions: Fall ?Precaution Comments: Watch O2 ?Restrictions ?Weight Bearing Restrictions: Yes ?LLE Weight Bearing: Weight bearing as tolerated  ? ?  ? ?Mobility ? Bed Mobility ?Overal bed mobility: Needs Assistance ?Bed Mobility: Supine to Sit ?  ?  ?Supine to sit: Mod assist, +2 for physical assistance, HOB elevated ?  ?  ?General bed mobility comments: VC's for sequencing and reaching for rail with LUE. ?  ? ?Transfers ?Overall transfer level: Needs assistance ?Equipment used: Rolling walker (2 wheels) ?Transfers: Sit to/from Stand, Bed to chair/wheelchair/BSC ?Sit to Stand: Mod assist, +2 physical assistance, From elevated surface ?  ?Step pivot transfers: Mod assist, +2 physical assistance, From elevated surface ?  ?  ?  ?General transfer comment: +2 assist for power up to full stand. Increased time to gain/maintain standing balance and for sequencing with pivotal steps around to the chair. ?  ? ?Ambulation/Gait ?  ?  ?  ?  ?  ?  ?  ?General Gait Details: Unable to progress to gait training this session. ? ?Stairs ?  ?  ?  ?  ?  ? ?Wheelchair Mobility ?  ? ?Modified Rankin (Stroke Patients Only) ?  ? ?  ? ?Balance Overall balance assessment: Needs assistance ?Sitting-balance support: Feet supported, No upper extremity supported ?Sitting balance-Leahy Scale: Fair ?  ?  ?Standing balance support: During functional activity, Bilateral upper extremity supported ?Standing balance-Leahy Scale: Poor ?  ?  ?  ?  ?  ?  ?  ?  ?  ?  ?  ?  ?   ? ? ? ?Pertinent Vitals/Pain Pain Assessment ?  Pain Assessment: Faces ?Pain Score: 0-No pain (0/10 reported at rest) ?Faces Pain Scale: Hurts even more ?Pain Location: LLE ?Pain Descriptors / Indicators: Operative site guarding, Sore ?Pain Intervention(s): Limited activity within patient's tolerance, Monitored during  session, Repositioned  ? ? ?Home Living Family/patient expects to be discharged to:: Private residence ?Living Arrangements: Alone ?Available Help at Discharge: Friend(s) ?Type of Home: Apartment ?Home Access: Level entry;Elevator ?  ?  ?  ?Home Layout: One level ?Home Equipment: Conservation officer, nature (2 wheels);Cane - single point;Cane - quad;Shower seat;Rollator (4 wheels);BSC/3in1;Wheelchair - manual ?   ?  ?Prior Function Prior Level of Function : Driving;Independent/Modified Independent ?  ?  ?  ?  ?  ?  ?Mobility Comments: Uses the rollator to go to the mailboxes, occasionally uses RW or SPC. ?ADLs Comments: Independent with all ADL's, cooking, cleaning. ?  ? ? ?Hand Dominance  ? Dominant Hand: Right ? ?  ?Extremity/Trunk Assessment  ? Upper Extremity Assessment ?Upper Extremity Assessment: RUE deficits/detail ?RUE Deficits / Details: Pt reports baseline limited ROM and difficulty reaching for her tray and railings with the RUE. ?  ? ?Lower Extremity Assessment ?Lower Extremity Assessment: Generalized weakness;LLE deficits/detail ?LLE Deficits / Details: Decreased strength, AROM, and internal rotation at the hip. ?  ? ?Cervical / Trunk Assessment ?Cervical / Trunk Assessment: Kyphotic (Mild)  ?Communication  ? Communication: No difficulties  ?Cognition Arousal/Alertness: Awake/alert ?Behavior During Therapy: [facility] for tasks assessed/performed ?Overall Cognitive Status: Within Functional Limits for tasks assessed ?  ?  ?  ?  ?  ?  ?  ?  ?  ?  ?  ?  ?  ?  ?  ?  ?  ?  ?  ? ?  ?General Comments   ? ?  ?Exercises General Exercises - Lower Extremity ?Ankle Circles/Pumps: 10 reps ?Quad Sets: 10 reps ?Long Arc Quad: 5 reps  ? ?Assessment/Plan  ?  ?PT Assessment Patient needs continued PT services  ?PT Problem List Decreased strength;Decreased activity tolerance;Decreased balance;Decreased mobility;Decreased knowledge of use of DME;Decreased safety awareness;Decreased knowledge of precautions;Pain ? ?   ?  ?PT Treatment  Interventions DME instruction;Gait training;Functional mobility training;Therapeutic activities;Therapeutic exercise;Balance training;Patient/family education   ? ?PT Goals (Current goals can be found in the Care Plan section)  ?Acute Rehab PT Goals ?Patient Stated Goal: Eventually be able to return to her independent living apartment. ?PT Goal Formulation: With patient ?Time For Goal Achievement: 08/17/21 ?Potential to Achieve Goals: Good ? ?  ?Frequency Min 4X/week ?  ? ? ?Co-evaluation   ?  ?  ?  ?  ? ? ?  ?AM-PAC PT "6 Clicks" Mobility  ?Outcome Measure Help needed turning from your back to your side while in a flat bed without using bedrails?: Total ?Help needed moving from lying on your back to sitting on the side of a flat bed without using bedrails?: Total ?Help needed moving to and from a bed to a chair (including a wheelchair)?: Total ?Help needed standing up from a chair using your arms (e.g., wheelchair or bedside chair)?: Total ?Help needed to walk in hospital room?: Total ?Help needed climbing 3-5 steps with a railing? : Total ?6 Click Score: 6 ? ?  ?End of Session Equipment Utilized During Treatment: Gait belt ?Activity Tolerance: Patient limited by pain ?Patient left: in chair;with call bell/phone within reach;with chair alarm set ?Nurse Communication: Mobility status ?PT Visit Diagnosis: Pain;Difficulty in walking, not elsewhere classified (R26.2) ?  ? ?Time: 0865-7846 ?PT Time Calculation (min) (ACUTE  ONLY): 38 min ? ? ?Charges:   PT Evaluation ?$PT Eval Moderate Complexity: 1 Mod ?PT Treatments ?$Gait Training: 23-37 mins ?  ?   ? ? ?Rolinda Roan, PT, DPT ?Acute Rehabilitation Services ?Pager: [phone] ?Office: [phone]  ? ?Tara Bradley ?08/03/2021, 2:23 PM ?

## 2021-08-03 NOTE — Assessment & Plan Note (Signed)
Hemoglobin dropped from 12 to the range of 7.  Continue monitor H&H.  Transfuse if hemoglobin drops less than 7

## 2021-08-03 NOTE — Progress Notes (Signed)
? ? ? ?  Subjective: ? ?Patient reports pain as well controlled. Mildly hypotensive since surgery. Worked well with PT today. Hgb 7.4. Denies distal n/t. ? ?Objective:  ? ?VITALS:   ?Vitals:  ? 08/02/21 1940 08/02/21 2324 08/03/21 0712 08/03/21 1500  ?BP: (!) 80/61 (!) 88/41 (!) 97/48 (!) 90/54  ?Pulse: 97  (!) 108 93  ?Resp: 14  17 18   ?Temp: (!) 97.4 ?F (36.3 ?C)  99.1 ?F (37.3 ?C) 98.4 ?F (36.9 ?C)  ?TempSrc: Oral  Oral Oral  ?SpO2: 100%  95% 97%  ?Weight:      ?Height:      ? ? ?Sensation intact distally ?Intact pulses distally ?Dorsiflexion/Plantar flexion intact ?Incision: dressing C/D/I ?Compartment soft ? ? ?Lab Results  ?Component Value Date  ? WBC 5.9 08/03/2021  ? HGB 7.4 (L) 08/03/2021  ? HCT 23.5 (L) 08/03/2021  ? MCV 94.4 08/03/2021  ? PLT 177 08/03/2021  ? ?BMET ?   ?Component Value Date/Time  ? NA 133 (L) 08/03/2021 0401  ? NA 140 07/17/2021 1229  ? K 4.8 08/03/2021 0401  ? CL 97 (L) 08/03/2021 0401  ? CO2 29 08/03/2021 0401  ? GLUCOSE 244 (H) 08/03/2021 0401  ? BUN 29 (H) 08/03/2021 0401  ? BUN 19 07/17/2021 1229  ? CREATININE 1.04 (H) 08/03/2021 0401  ? CREATININE 0.71 12/31/2015 1504  ? CALCIUM 8.1 (L) 08/03/2021 0401  ? EGFR 62 07/17/2021 1229  ? GFRNONAA 49 (L) 08/03/2021 0401  ? ? ? ? ?Xray: post op xrays show well reduced fx, components in good position without adverse features. ? ?Assessment/Plan: ?1 Day Post-Op  ? ?Principal Problem: ?  Hip fracture ([place]) ?Active Problems: ?  DM type 2, controlled, with complication ([place]) ?  Essential hypertension ?  Persistent atrial fibrillation ([place]) ?  Hyperlipidemia ?  Fall at home, initial encounter ?  Chronic combined systolic and diastolic heart failure ([place]) ?  DNR (do not resuscitate) ?  Acute postoperative anemia due to expected blood loss ? ?Left intertroch femur fracture s/p ORIF with CMN 3/12 ? ?  ?Post op recs: ?WB: WBAT LLE ?Abx: ancef x23 hours post op ?Imaging: PACU xrays ?Dressing: keep intact until follow up, change PRN if soiled or  saturated. ?DVT prophylaxis: xarelto 10mg  POD1-2, then resume xarelto 20mg  POD3 ?Follow up: 2 weeks after surgery for a wound check with Dr. Zachery Dakins at [facility].  ?Address: [address] [place], [place], [place] [address]  ?Office Phone: [phone] ? ? ? ?Tara Bradley ?08/03/2021, 4:25 PM ? ? ?Charlies Constable, MD ? ?Contact information:   ?Weekdays 7am-5pm epic message Dr. Zachery Dakins, or call office for patient follow up: [phone] ?After hours and holidays please check [email or web address] for group call information for [facility] ? ?  ?

## 2021-08-03 NOTE — TOC CAGE-AID Note (Signed)
Transition of Care (TOC) - CAGE-AID Screening ? ? ?Patient Details  ?Name: Tara Bradley ?MRN: [id number] ?Date of Birth: 1923-01-04 ? ?Transition of Care (TOC) CM/SW Contact:    ?Notnamed Croucher C Tarpley-Carter, LCSWA ?Phone Number: ?08/03/2021, 3:32 PM ? ? ?Clinical Narrative: ?Pt participated in [place].  Pt stated she does not use substance or ETOH.  Pt was not offered resources, due to no usage of substance or ETOH.    ? ?Passenger transport manager, MSW, LCSW-A ?Pronouns:  She/Her/Hers ?[facility]itions of Care ?Clinical Social Worker ?Direct Number:  [phone] ?[email or web address] ? ?CAGE-AID Screening: ?  ? ?Have You Ever Felt You Ought to Cut Down on Your Drinking or Drug Use?: No ?Have People Annoyed You By Critizing Your Drinking Or Drug Use?: No ?Have You Felt Bad Or Guilty About Your Drinking Or Drug Use?: No ?Have You Ever Had a Drink or Used Drugs First Thing In The Morning to Steady Your Nerves or to Get Rid of a Hangover?: No ?CAGE-AID Score: 0 ? ?Substance Abuse Education Offered: No ? ?  ? ? ? ? ? ? ?

## 2021-08-04 ENCOUNTER — Inpatient Hospital Stay (HOSPITAL_COMMUNITY): Payer: Medicare Other

## 2021-08-04 DIAGNOSIS — S72002A Fracture of unspecified part of neck of left femur, initial encounter for closed fracture: Secondary | ICD-10-CM | POA: Diagnosis not present

## 2021-08-04 DIAGNOSIS — R338 Other retention of urine: Secondary | ICD-10-CM

## 2021-08-04 LAB — PREPARE RBC (CROSSMATCH)

## 2021-08-04 LAB — CBC WITH DIFFERENTIAL/PLATELET
Abs Immature Granulocytes: 0.04 10*3/uL (ref 0.00–0.07)
Basophils Absolute: 0 10*3/uL (ref 0.0–0.1)
Basophils Relative: 0 %
Eosinophils Absolute: 0 10*3/uL (ref 0.0–0.5)
Eosinophils Relative: 0 %
HCT: 22.3 % — ABNORMAL LOW (ref 36.0–46.0)
Hemoglobin: 7.1 g/dL — ABNORMAL LOW (ref 12.0–15.0)
Immature Granulocytes: 1 %
Lymphocytes Relative: 20 %
Lymphs Abs: 1.7 10*3/uL (ref 0.7–4.0)
MCH: 29.7 pg (ref 26.0–34.0)
MCHC: 31.8 g/dL (ref 30.0–36.0)
MCV: 93.3 fL (ref 80.0–100.0)
Monocytes Absolute: 1.1 10*3/uL — ABNORMAL HIGH (ref 0.1–1.0)
Monocytes Relative: 13 %
Neutro Abs: 5.6 10*3/uL (ref 1.7–7.7)
Neutrophils Relative %: 66 %
Platelets: 157 10*3/uL (ref 150–400)
RBC: 2.39 MIL/uL — ABNORMAL LOW (ref 3.87–5.11)
RDW: 16.5 % — ABNORMAL HIGH (ref 11.5–15.5)
WBC: 8.4 10*3/uL (ref 4.0–10.5)
nRBC: 0 % (ref 0.0–0.2)

## 2021-08-04 LAB — HEMOGLOBIN AND HEMATOCRIT, BLOOD
HCT: 26.2 % — ABNORMAL LOW (ref 36.0–46.0)
Hemoglobin: 8.7 g/dL — ABNORMAL LOW (ref 12.0–15.0)

## 2021-08-04 LAB — BASIC METABOLIC PANEL
Anion gap: 5 (ref 5–15)
BUN: 36 mg/dL — ABNORMAL HIGH (ref 8–23)
CO2: 29 mmol/L (ref 22–32)
Calcium: 8.2 mg/dL — ABNORMAL LOW (ref 8.9–10.3)
Chloride: 95 mmol/L — ABNORMAL LOW (ref 98–111)
Creatinine, Ser: 1.2 mg/dL — ABNORMAL HIGH (ref 0.44–1.00)
GFR, Estimated: 41 mL/min — ABNORMAL LOW (ref >60)
Glucose, Bld: 248 mg/dL — ABNORMAL HIGH (ref 70–99)
Potassium: 4.9 mmol/L (ref 3.5–5.1)
Sodium: 129 mmol/L — ABNORMAL LOW (ref 135–145)

## 2021-08-04 LAB — GLUCOSE, CAPILLARY
Glucose-Capillary: 229 mg/dL — ABNORMAL HIGH (ref 70–99)
Glucose-Capillary: 325 mg/dL — ABNORMAL HIGH (ref 70–99)
Glucose-Capillary: 270 mg/dL — ABNORMAL HIGH (ref 70–99)
Glucose-Capillary: 252 mg/dL — ABNORMAL HIGH (ref 70–99)

## 2021-08-04 MED ORDER — METHOCARBAMOL 500 MG PO TABS: 500.0000 mg | ORAL_TABLET | Freq: Three times a day (TID) | ORAL | 0 refills | Status: AC | PRN

## 2021-08-04 MED ORDER — INSULIN GLARGINE-YFGN 100 UNIT/ML ~~LOC~~ SOLN
15.0000 [IU] | Freq: Every day | SUBCUTANEOUS | Status: DC
Start: 2021-08-04 — End: 2021-08-05
  Administered 2021-08-04 – 2021-08-05 (×2): 15 [IU] via SUBCUTANEOUS
  Filled 2021-08-04 (×2): qty 0.15

## 2021-08-04 MED ORDER — SODIUM CHLORIDE 0.9% IV SOLUTION: Freq: Once | INTRAVENOUS | Status: AC

## 2021-08-04 MED ORDER — METOPROLOL SUCCINATE ER 50 MG PO TB24: 50.0000 mg | ORAL_TABLET | Freq: Every day | ORAL | Status: DC

## 2021-08-04 MED ORDER — ADULT MULTIVITAMIN W/MINERALS CH
1.0000 | ORAL_TABLET | Freq: Every day | ORAL | Status: DC
  Administered 2021-08-04 – 2021-08-08 (×5): 1 via ORAL
  Filled 2021-08-04 (×5): qty 1

## 2021-08-04 MED ORDER — RIVAROXABAN 15 MG PO TABS
15.0000 mg | ORAL_TABLET | Freq: Every day | ORAL | Status: DC
  Administered 2021-08-05 – 2021-08-08 (×4): 15 mg via ORAL
  Filled 2021-08-04 (×4): qty 1

## 2021-08-04 MED ORDER — ENSURE MAX PROTEIN PO LIQD
11.0000 [oz_av] | Freq: Two times a day (BID) | ORAL | Status: DC
  Administered 2021-08-04 – 2021-08-05 (×2): 11 [oz_av] via ORAL
  Administered 2021-08-06: 237 mL via ORAL
  Administered 2021-08-06 – 2021-08-07 (×2): 11 [oz_av] via ORAL

## 2021-08-04 MED ORDER — OXYCODONE HCL 5 MG PO TABS: 5.0000 mg | ORAL_TABLET | ORAL | 0 refills | Status: AC | PRN

## 2021-08-04 MED ORDER — METOPROLOL SUCCINATE ER 25 MG PO TB24: 25.0000 mg | ORAL_TABLET | Freq: Every day | ORAL | Status: DC

## 2021-08-04 MED ORDER — DILTIAZEM HCL ER COATED BEADS 180 MG PO CP24: 180.0000 mg | ORAL_CAPSULE | Freq: Every day | ORAL | Status: DC

## 2021-08-04 MED ORDER — ACETAMINOPHEN 500 MG PO TABS: 1000.0000 mg | ORAL_TABLET | Freq: Three times a day (TID) | ORAL | 0 refills | Status: DC | PRN

## 2021-08-04 MED ORDER — SODIUM CHLORIDE 0.9 % IV SOLN: INTRAVENOUS | Status: DC

## 2021-08-04 MED ORDER — DILTIAZEM HCL ER COATED BEADS 120 MG PO CP24
120.0000 mg | ORAL_CAPSULE | Freq: Every day | ORAL | Status: DC
Start: 2021-08-05 — End: 2021-08-06
  Administered 2021-08-05 – 2021-08-06 (×2): 120 mg via ORAL
  Filled 2021-08-04 (×2): qty 1

## 2021-08-04 MED ORDER — RIVAROXABAN 20 MG PO TABS: 20.0000 mg | ORAL_TABLET | Freq: Every day | ORAL | Status: DC

## 2021-08-04 MED ORDER — TAMSULOSIN HCL 0.4 MG PO CAPS
0.4000 mg | ORAL_CAPSULE | Freq: Every day | ORAL | Status: DC
  Administered 2021-08-04 – 2021-08-08 (×5): 0.4 mg via ORAL
  Filled 2021-08-04 (×5): qty 1

## 2021-08-04 NOTE — Progress Notes (Addendum)
? ? ? ?  Subjective: ? ?Patient reports pain as well controlled. Mildly hypotensive since surgery. Muscle spasm in the thigh. Denies distal n/t. Eager to get out of bed, states she is feeling stiff. Hgb 7.1 this AM and she received a blood transfusion. ? ?Objective:  ? ?VITALS:   ?Vitals:  ? 08/04/21 0731 08/04/21 0955 08/04/21 1018 08/04/21 1240  ?BP: 102/63 (!) 94/53 (!) 93/51 (!) 96/46  ?Pulse: (!) 106 85 81 68  ?Resp: _0 ?Temp: 98.5 ?F (36.9 ?C) 98 ?F (36.7 ?C) 98.1 ?F (36.7 ?C) 98.1 ?F (36.7 ?C)  ?TempSrc: Oral Oral Oral Oral  ?SpO2: 97% 97% 98% 98%  ?Weight:      ?Height:      ? ? ?Sensation intact distally ?Intact pulses distally ?Dorsiflexion/Plantar flexion intact ?Incision: dressing C/D/I ?Compartment soft ? ? ?Lab Results  ?Component Value Date  ? WBC 8.4 08/04/2021  ? HGB 7.1 (L) 08/04/2021  ? HCT 22.3 (L) 08/04/2021  ? MCV 93.3 08/04/2021  ? PLT 157 08/04/2021  ? ?BMET ?   ?Component Value Date/Time  ? NA 129 (L) 08/04/2021 0308  ? NA 140 07/17/2021 1229  ? K 4.9 08/04/2021 0308  ? CL 95 (L) 08/04/2021 0308  ? CO2 29 08/04/2021 0308  ? GLUCOSE 248 (H) 08/04/2021 0308  ? BUN 36 (H) 08/04/2021 0308  ? BUN 19 07/17/2021 1229  ? CREATININE 1.20 (H) 08/04/2021 0308  ? CREATININE 0.71 12/31/2015 1504  ? CALCIUM 8.2 (L) 08/04/2021 0308  ? EGFR 62 07/17/2021 1229  ? GFRNONAA 41 (L) 08/04/2021 0308  ? ? ? ? ?Xray: post op xrays show well reduced fx, components in good position without adverse features. ? ?Assessment/Plan: ?2 Days Post-Op  ? ?Principal Problem: ?  Hip fracture ([place]) ?Active Problems: ?  DM type 2, controlled, with complication ([place]) ?  Essential hypertension ?  Persistent atrial fibrillation ([place]) ?  Hyperlipidemia ?  Fall at home, initial encounter ?  Chronic combined systolic and diastolic heart failure ([place]) ?  DNR (do not resuscitate) ?  Acute postoperative anemia due to expected blood loss ?  Acute urinary retention ? ?Left intertroch femur fracture s/p ORIF with CMN 3/12 ? ?  ?Post  op recs: ?WB: WBAT LLE ?Abx: ancef x23 hours post op ?Imaging: PACU xrays ?Dressing: keep intact until follow up, change PRN if soiled or saturated. ?DVT prophylaxis: xarelto 18m POD1-2, then resume xarelto 143m(home dose) POD3 ?Follow up: 2 weeks after surgery [facility] ?Address: [address] [address]?Office Phone: [phone] ? ? ?Daveah Varone A Stephanne Greeley ?08/04/2021, 1:46 PM ? ? ?DaCharlies ConstableMD ? ?Contact information:   ?Weekdays 7am-5pm epic message Dr. MaZachery Dakinsor call office for patient follow up: [phone] ?After hours and holidays please check [email or web address] for group call information for [facility] ? ?  ?

## 2021-08-04 NOTE — Progress Notes (Signed)
Initial Nutrition Assessment ? ?DOCUMENTATION CODES:  ?Not applicable ? ?INTERVENTION:  ?-Ensure Max po BID, each supplement provides 150 kcal and 30 grams of protein. ?-MVI with minerals daily ? ?NUTRITION DIAGNOSIS:  ?Increased nutrient needs related to post-op healing as evidenced by estimated needs. ? ?GOAL:  ?Patient will meet greater than or equal to 90% of their needs ? ?MONITOR:  ?PO intake, Supplement acceptance, Weight trends, Labs, I & O's ? ?REASON FOR ASSESSMENT:  ?Consult ?Hip fracture protocol ? ?ASSESSMENT:  ?Pt with PMH significant for type 2 DM, HTN, HLD, CHF, and A.fib admitted for hip fracture s/p mechanical fall ? ?3/12 - s/p IM nailing  ? ?Per MD, pt became hypotensive and anemic 2/2 acute blood loss anemia postoperatively. MD anticipates pt will be stable for d/c to SNF tomorrow, pending bed availability.  ? ?Pt reports appetite is good, denies any recent changes to weight and/or appetite. ? ?PO Intake: 50-100% x 3 recorded meals (75% avg intake)  ? ?UOP: 535m x24 hours ?I/O: +21588msince admit ? ?Medications: colace, SSI TID w/ meals, 15 units semglee daily, mag-ox, miralax, senokot ?Labs: ?Recent Labs  ?Lab 08/02/21 ?[id number]/13/23 ?0401 08/04/21 ?0308  ?NA 133* 133* 129*  ?K 3.7 4.8 4.9  ?CL 96* 97* 95*  ?CO2 '26 29 29  '$ ?BUN 25* 29* 36*  ?CREATININE 0.90 1.04* 1.20*  ?CALCIUM 8.9 8.1* 8.2*  ?GLUCOSE 299* 244* 248*  ?Hgb 7.1 ?CBGs: 93-341 x24 hours ?  ?NUTRITION - FOCUSED PHYSICAL EXAM: ?No depletions noted. ? ?Diet Order:   ?Diet Order   ? ?       ?  Diet regular Room service appropriate? Yes; Fluid consistency: Thin  Diet effective now       ?  ? ?  ?  ? ?  ? ?EDUCATION NEEDS:  ?No education needs have been identified at this time ? ?Skin:  Skin Assessment: Skin Integrity Issues: ?Skin Integrity Issues:: Incisions ?Incisions: leg ? ?Last BM:  PTA ? ?Height:  ?Ht Readings from Last 1 Encounters:  ?08/02/21 5' 5.5" (1.664 m)  ? ?Weight:  ?Wt Readings from Last 1 Encounters:  ?08/02/21 61.2  kg  ? ?BMI:  Body mass index is 22.12 kg/m?. ? ?Estimated Nutritional Needs:  ?Kcal:  1550-1750 ?Protein:  75-90 grams ?Fluid:  >1.55L ? ? ?AmTheone Stanley MS, RD, LDN (she/her/hers) ?RD pager number and weekend/on-call pager number located in [facility] [place]? ?

## 2021-08-04 NOTE — Progress Notes (Signed)
?PROGRESS NOTE ? ?Tara Bradley  CBJ:[id number] DOB: 1922/11/05 DOA: 08/02/2021 ?PCP: [name], MD  ? ?Brief Narrative: ?Patient is a 88 year old female with history of diabetes type 2, hypertension, hyperlipidemia, chronic diastolic CHF, A-fib on Xarelto who presented with a fall from home.  Patient lives alone and ambulatory at baseline.  She was recently admitted here for CHF exacerbation/viral pneumonia.  Imagings on presentation showed left intertrochanteric hip fracture.  Orthopedics consulted and she underwent intramedullary nailing on 3/12.  Postoperatively, she became hypotensive and anemic secondary to acute blood loss anemia.  She is likely stable for discharge tomorrow to skilled nursing facility if bed is available. ? ? ?Assessment & Plan: ? ?Principal Problem: ?  Hip fracture ([place]) ?Active Problems: ?  Acute postoperative anemia due to expected blood loss ?  DM type 2, controlled, with complication ([place]) ?  Essential hypertension ?  Persistent atrial fibrillation ([place]) ?  Hyperlipidemia ?  Fall at home, initial encounter ?  Chronic combined systolic and diastolic heart failure ([place]) ?  DNR (do not resuscitate) ?  Acute urinary retention ? ? ?Assessment and Plan: ?* Hip fracture ([place]) ?-Apparently mechanical fall resulting in hip fracture ?-S/P intramedullary nailing ?-Pain management, supportive care ?-PT/OT recommending skilled nursing facility discharge ?-TOC team consulted for rehab placement ? ? ?Acute postoperative anemia due to expected blood loss ?Hemoglobin dropped from 12 to the range of 7.  She is being transfused a unit of PRBC today.  Check CBC tomorrow. ? ?Acute urinary retention ?Unable to pass urine since last night.  Continue in and out and place Foley as per protocol.  Started on tamsulosin. ? ?DNR (do not resuscitate) ?Code status is DNR ? ?Chronic combined systolic and diastolic heart failure ([place]) ?-recent echo with preserved EF, grade 2 diastolic dysfunction ?-Currently on  the drier side.  Continue gentle IV fluids ? ?Fall at home, initial encounter ?-Appears to be a mechanical fall ?-She reports that she felt like the hip gave way prior to the fall ?-No other obvious injuries currently ? ?Hyperlipidemia ?-last LDL was 70  ?-She does not appear to be taking medications for this issue at this time ? ?Persistent atrial fibrillation ([place]) ?-Patient with chronic afib  ?-Continue home Cardizem and Toprol XL ?-On Xarelto for anticoagulation ? ?Essential hypertension ?-Blood pressure soft but she is asymptomatic ?-Continue diltiazem, metoprolol for Afib control ? ?DM type 2, controlled, with complication ([place]) ?-recent A1c was 8.6 ?-Take metformin at home and also insulin ?-Cover with sensitive-scale SSI.  Added Lantus for hyperglycemia ? ? ? ? ? ? ?  ?  ? ?DVT prophylaxis:rivaroxaban (XARELTO) tablet 10 mg Start: 08/03/21 1000 ?SCDs Start: 08/02/21 1242 ?rivaroxaban (XARELTO) tablet 10 mg  ? ?  Code Status: DNR ? ?Family Communication: None at bedside.Daughter lives at [place] ? ?Patient status:Inpatient ? ?Patient is from :Home ? ?Anticipated discharge to:SNF ? ?Estimated DC date:1-2 days ? ? ?Consultants: Orthopedics ? ?Procedures: Intramedullary nailing ? ?Antimicrobials:  ?Anti-infectives (From admission, onward)  ? ? Start     Dose/Rate Route Frequency Ordered Stop  ? 08/03/21 0600  ceFAZolin (ANCEF) IVPB 2g/100 mL premix       ? 2 g ?200 mL/hr over 30 Minutes Intravenous On call to O.R. 08/02/21 1217 08/02/21 1335  ? 08/02/21 1509  vancomycin (VANCOCIN) powder  Status:  Discontinued       ?   As needed 08/02/21 1510 08/02/21 1533  ? ?  ? ? ?Subjective: ? ?Patient seen and examined at the  bedside this morning.  Blood pressure soft but overall hemodynamically stable.  New issue with urinary retention.  Pain on the surgical site is well controlled. ? ?Objective: ?Vitals:  ? 08/04/21 0500 08/04/21 0731 08/04/21 0955 08/04/21 1018  ?BP: 99/60 102/63 (!) 94/53 (!) 93/51  ?Pulse: 96 (!) 106  85 81  ?Resp: '18 17 17 15  '$ ?Temp: 98.5 ?F (36.9 ?C) 98.5 ?F (36.9 ?C) 98 ?F (36.7 ?C) 98.1 ?F (36.7 ?C)  ?TempSrc: Oral Oral Oral Oral  ?SpO2: 98% 97% 97% 98%  ?Weight:      ?Height:      ? ? ?Intake/Output Summary (Last 24 hours) at 08/04/2021 1028 ?Last data filed at 08/03/2021 1500 ?Gross per 24 hour  ?Intake 725 ml  ?Output 400 ml  ?Net 325 ml  ? ?Filed Weights  ? 08/02/21 0843  ?Weight: 61.2 kg  ? ? ?Examination: ? ? ?General exam: Overall comfortable, not in distress, very deconditioned elderly female ?HEENT: PERRL ?Respiratory system:  no wheezes or crackles  ?Cardiovascular system: Irregularly irregular rhythm ?Gastrointestinal system: Abdomen is mildly distended on the suprapubic area, soft and nontender. ?Central nervous system: Alert and oriented ?Extremities: No edema, no clubbing ,no cyanosis, surgical wound on the left hip ?Skin: No rashes, no ulcers,no icterus   ? ? ?Data Reviewed: I have personally reviewed following labs and imaging studies ? ?CBC: ?Recent Labs  ?Lab 08/02/21 ?3202 08/03/21 ?0401 08/04/21 ?0308  ?WBC 11.0* 5.9 8.4  ?NEUTROABS 8.4*  --  5.6  ?HGB 12.3 7.4* 7.1*  ?HCT 37.8 23.5* 22.3*  ?MCV 93.6 94.4 93.3  ?PLT 251 177 157  ? ?Basic Metabolic Panel: ?Recent Labs  ?Lab 08/02/21 ?3343 08/03/21 ?0401 08/04/21 ?0308  ?NA 133* 133* 129*  ?K 3.7 4.8 4.9  ?CL 96* 97* 95*  ?CO2 '26 29 29  '$ ?GLUCOSE 299* 244* 248*  ?BUN 25* 29* 36*  ?CREATININE 0.90 1.04* 1.20*  ?CALCIUM 8.9 8.1* 8.2*  ? ? ? ?Recent Results (from the past 240 hour(s))  ?Resp Panel by RT-PCR (Flu A&B, Covid) Nasopharyngeal Swab     Status: None  ? Collection Time: 08/02/21  8:47 AM  ? Specimen: Nasopharyngeal Swab; Nasopharyngeal(NP) swabs in vial transport medium  ?Result Value Ref Range Status  ? SARS Coronavirus 2 by RT PCR NEGATIVE NEGATIVE Final  ?  Comment: (NOTE) ?SARS-CoV-2 target nucleic acids are NOT DETECTED. ? ?The SARS-CoV-2 RNA is generally detectable in upper respiratory ?specimens during the acute phase of  infection. The lowest ?concentration of SARS-CoV-2 viral copies this assay can detect is ?138 copies/mL. A negative result does not preclude SARS-Cov-2 ?infection and should not be used as the sole basis for treatment or ?other patient management decisions. A negative result may occur with  ?improper specimen collection/handling, submission of specimen other ?than nasopharyngeal swab, presence of viral mutation(s) within the ?areas targeted by this assay, and inadequate number of viral ?copies(<138 copies/mL). A negative result must be combined with ?clinical observations, patient history, and epidemiological ?information. The expected result is Negative. ? ?Fact Sheet for Patients:  ?EntrepreneurPulse.com.au ? ?Fact Sheet for Healthcare Providers:  ?IncredibleEmployment.be ? ?This test is no t yet approved or cleared by the [place] FDA and  ?has been authorized for detection and/or diagnosis of SARS-CoV-2 by ?FDA under an Emergency Use Authorization (EUA). This EUA will remain  ?in effect (meaning this test can be used) for the duration of the ?COVID-19 declaration under Section 564(b)(1) of the Act, 21 ?U.S.C.section 360bbb-3(b)(1), unless the authorization is terminated  ?  or revoked sooner.  ? ? ?  ? Influenza A by PCR NEGATIVE NEGATIVE Final  ? Influenza B by PCR NEGATIVE NEGATIVE Final  ?  Comment: (NOTE) ?The Xpert Xpress SARS-CoV-2/FLU/RSV plus assay is intended as an aid ?in the diagnosis of influenza from Nasopharyngeal swab specimens and ?should not be used as a sole basis for treatment. Nasal washings and ?aspirates are unacceptable for Xpert Xpress SARS-CoV-2/FLU/RSV ?testing. ? ?Fact Sheet for Patients: ?EntrepreneurPulse.com.au ? ?Fact Sheet for Healthcare Providers: ?IncredibleEmployment.be ? ?This test is not yet approved or cleared by the [place] FDA and ?has been authorized for detection and/or diagnosis of SARS-CoV-2  by ?FDA under an Emergency Use Authorization (EUA). This EUA will remain ?in effect (meaning this test can be used) for the duration of the ?COVID-19 declaration under Section 564(b)(1) of the Act, 21 U.S.C. ?

## 2021-08-04 NOTE — Progress Notes (Signed)
Physical Therapy Treatment ?Patient Details ?Name: Tara Bradley ?MRN: [id number] ?DOB: August 09, 1922 ?Today's Date: 08/04/2021 ? ? ?History of Present Illness Pt is a 88 y/o female who presents s/p fall, sustaining left intertrochanteric hip fracture.  Orthopedics consulted and she underwent intramedullary nailing on 08/02/21. PMH significant for bradycardia, CA, DM II, HTN, osteoporosis, thoracic aortic aneurysm, pelvic fracture 2006, prior ORIF femur fracture (unspecified laterality). ? ?  ?PT Comments  ? ? Focus of session today was bed mobility, sitting tolerance, and EOB exercises. The patient tolerated well but was limited secondary to blood pressure stability.  Pt. Shows improvement in her ability to move her L LE through AROM. Pain, strength, and vital stability are/is still limiting function. Pt. Would benefit from skilled PT to continue to address her functional mobility, strength, and balance. Plan and discharge setting remains unchanged. Pt to follow acutely as appropriate.  ? ?Orthostatic BPs ? ?Sitting 85/61  ?Sitting after 3 min 85/54  ?Supine 97/58  ? ? ?   ?Recommendations for follow up therapy are one component of a multi-disciplinary discharge planning process, led by the attending physician.  Recommendations may be updated based on patient status, additional functional criteria and insurance authorization. ? ?Follow Up Recommendations ? Skilled nursing-short term rehab (<3 hours/day) ?  ?  ?Assistance Recommended at Discharge Frequent or constant Supervision/Assistance  ?Patient can return home with the following Two people to help with walking and/or transfers;A lot of help with bathing/dressing/bathroom;Assist for transportation;Assistance with cooking/housework;A lot of help with walking and/or transfers ?  ?Equipment Recommendations ? None recommended by PT  ?  ?Recommendations for Other Services   ? ? ?  ?Precautions / Restrictions Precautions ?Precautions: Fall ?Precaution Comments: Watch  O2 ?Restrictions ?Weight Bearing Restrictions: Yes ?LLE Weight Bearing: Weight bearing as tolerated  ?  ? ?Mobility ? Bed Mobility ?Overal bed mobility: Needs Assistance ?Bed Mobility: Supine to Sit, Sit to Supine ?  ?  ?Supine to sit: +2 for physical assistance, HOB elevated, Min assist ?Sit to supine: Mod assist ?  ?General bed mobility comments: VC/TC for sequencing, LE management and using rail with UE. Pt. reports dizziness/lightheadedness at EOB, BP 85/61, after 3 min 85/54, after lying down in trendelenburg 97/58 ?  ? ?Transfers ?  ?  ?  ?  ?  ?  ?  ?  ?  ?  ?  ? ?Ambulation/Gait ?  ?  ?  ?  ?  ?  ?  ?General Gait Details: Unable to progress to gait training this session. ? ? ?Stairs ?  ?  ?  ?  ?  ? ? ?Wheelchair Mobility ?  ? ?Modified Rankin (Stroke Patients Only) ?  ? ? ?  ?Balance Overall balance assessment: Needs assistance ?Sitting-balance support: Feet supported, No upper extremity supported ?Sitting balance-Leahy Scale: Fair ?Sitting balance - Comments: Pt. can sit without using UE. When doing LE exercises she reaches out for UE. Slight R lean, could be from bed positioning. ?Postural control: Right lateral lean ?  ?  ?  ?  ?  ?  ?  ?  ?  ?  ?  ?  ?  ?  ?  ? ?  ?Cognition Arousal/Alertness: Awake/alert ?Behavior During Therapy: [facility] for tasks assessed/performed ?Overall Cognitive Status: Within Functional Limits for tasks assessed ?  ?  ?  ?  ?  ?  ?  ?  ?  ?  ?  ?  ?  ?  ?  ?  ?  ?  ?  ? ?  ?  Exercises Total Joint Exercises ?Ankle Circles/Pumps: AROM, Both, 20 reps ?Quad Sets: AROM, Left, 10 reps ?Long Arc Quad: AROM, Both, 20 reps ? ?  ?General Comments   ?  ?  ? ?Pertinent Vitals/Pain Pain Assessment ?Pain Assessment: Faces ?Pain Score: 0-No pain (0/10 at rest, 10/10 during movement) ?Pain Location: LLE ?Pain Descriptors / Indicators: Operative site guarding, Sore ?Pain Intervention(s): Limited activity within patient's tolerance, Monitored during session  ? ? ?Home Living   ?  ?  ?  ?  ?  ?  ?  ?   ?  ?   ?  ?Prior Function    ?  ?  ?   ? ?PT Goals (current goals can now be found in the care plan section) Acute Rehab PT Goals ?Patient Stated Goal: Eventually be able to return to her independent living apartment. ?PT Goal Formulation: With patient ?Time For Goal Achievement: 08/17/21 ?Potential to Achieve Goals: Fair ?Progress towards PT goals: Progressing toward goals ? ?  ?Frequency ? ? ? Min 3X/week ? ? ? ?  ?PT Plan Current plan remains appropriate  ? ? ?Co-evaluation   ?  ?  ?  ?  ? ?  ?AM-PAC PT "6 Clicks" Mobility   ?Outcome Measure ? Help needed turning from your back to your side while in a flat bed without using bedrails?: A Little ?Help needed moving from lying on your back to sitting on the side of a flat bed without using bedrails?: A Lot ?Help needed moving to and from a bed to a chair (including a wheelchair)?: A Lot ?Help needed standing up from a chair using your arms (e.g., wheelchair or bedside chair)?: A Lot ?Help needed to walk in hospital room?: Total ?Help needed climbing 3-5 steps with a railing? : Total ?6 Click Score: 11 ? ?  ?End of Session   ?Activity Tolerance: Patient limited by fatigue ?Patient left: in bed;with call bell/phone within reach;with bed alarm set;with family/visitor present ?Nurse Communication: Mobility status (hypotension at EOB) ?PT Visit Diagnosis: Pain;Difficulty in walking, not elsewhere classified (R26.2) ?Pain - Right/Left: Left ?Pain - part of body: Hip ?  ? ? ?Time: 7654-6503 ?PT Time Calculation (min) (ACUTE ONLY): 24 min ? ?Charges:  $Therapeutic Activity: 8-22 mins          ?          ? ?Thermon Leyland, SPT ?Acute Rehab Services ? ? ? ?Thermon Leyland ?08/04/2021, 4:30 PM ? ?

## 2021-08-04 NOTE — Assessment & Plan Note (Addendum)
Started on tamsulosin.Improved ?
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
## 2021-08-04 NOTE — Plan of Care (Addendum)
Patient has not voided since foley removed yesterday at 2pm per night shift nurse....bladder scan this morning shows 341, night shift RN stated she tried an in and out cath but hit resistance. Patient abdomen distended and rock hard... Notified Dr. Tawanna Solo for futher orders... will attempt another in and out per Dr. Tawanna Solo. ? ?1 unit of blood complete 1240 ? ?Problem: Education: ?Goal: Verbalization of understanding the information provided (i.e., activity precautions, restrictions, etc) will improve ?Outcome: Progressing ?  ?Problem: Activity: ?Goal: Ability to ambulate and perform ADLs will improve ?Outcome: Progressing ?  ?Problem: Pain Management: ?Goal: Pain level will decrease ?Outcome: Progressing ?  ?

## 2021-08-04 NOTE — TOC Progression Note (Addendum)
Transition of Care (TOC) - Progression Note  ? ? ?Patient Details  ?Name: Tara Bradley ?MRN: [id number] ?Date of Birth: 1922-07-29 ? ?Transition of Care (TOC) CM/SW Contact  ?[name], LCSW ?Phone Number: ?08/04/2021, 12:32 PM ? ?Clinical Narrative:   CSW presented bed offers to pt, she would like [facility] as long as there is not a covid outbreak at the building.  CSW spoke with Star at [facility] who confirmed that they have zero covid cases currently, pt accepts offer.   ? ?1523: auth request submitted in navi.  ? ?  ?  ? ?Expected Discharge Plan and Services ?  ?  ?  ?  ?  ?                ?  ?  ?  ?  ?  ?  ?  ?  ?  ?  ? ? ?Social Determinants of Health (SDOH) Interventions ?  ? ?Readmission Risk Interventions ?Readmission Risk Prevention Plan 05/11/2021  ?Transportation Screening Complete  ?Home Care Screening Complete  ?Some recent data might be hidden  ? ? ?

## 2021-08-05 ENCOUNTER — Encounter (HOSPITAL_COMMUNITY): Payer: Self-pay | Admitting: Orthopedic Surgery

## 2021-08-05 DIAGNOSIS — E871 Hypo-osmolality and hyponatremia: Secondary | ICD-10-CM

## 2021-08-05 LAB — CBC WITH DIFFERENTIAL/PLATELET
Abs Immature Granulocytes: 0.11 10*3/uL — ABNORMAL HIGH (ref 0.00–0.07)
Basophils Absolute: 0 10*3/uL (ref 0.0–0.1)
Basophils Relative: 0 %
Eosinophils Absolute: 0 10*3/uL (ref 0.0–0.5)
Eosinophils Relative: 0 %
HCT: 22.7 % — ABNORMAL LOW (ref 36.0–46.0)
Hemoglobin: 7.5 g/dL — ABNORMAL LOW (ref 12.0–15.0)
Immature Granulocytes: 1 %
Lymphocytes Relative: 9 %
Lymphs Abs: 0.8 10*3/uL (ref 0.7–4.0)
MCH: 30.1 pg (ref 26.0–34.0)
MCHC: 33 g/dL (ref 30.0–36.0)
MCV: 91.2 fL (ref 80.0–100.0)
Monocytes Absolute: 1 10*3/uL (ref 0.1–1.0)
Monocytes Relative: 11 %
Neutro Abs: 6.9 10*3/uL (ref 1.7–7.7)
Neutrophils Relative %: 79 %
Platelets: 131 10*3/uL — ABNORMAL LOW (ref 150–400)
RBC: 2.49 MIL/uL — ABNORMAL LOW (ref 3.87–5.11)
RDW: 15.9 % — ABNORMAL HIGH (ref 11.5–15.5)
WBC: 8.8 10*3/uL (ref 4.0–10.5)
nRBC: 0.2 % (ref 0.0–0.2)

## 2021-08-05 LAB — BASIC METABOLIC PANEL
Anion gap: 7 (ref 5–15)
BUN: 37 mg/dL — ABNORMAL HIGH (ref 8–23)
CO2: 24 mmol/L (ref 22–32)
Calcium: 8 mg/dL — ABNORMAL LOW (ref 8.9–10.3)
Chloride: 92 mmol/L — ABNORMAL LOW (ref 98–111)
Creatinine, Ser: 1.14 mg/dL — ABNORMAL HIGH (ref 0.44–1.00)
GFR, Estimated: 44 mL/min — ABNORMAL LOW (ref >60)
Glucose, Bld: 188 mg/dL — ABNORMAL HIGH (ref 70–99)
Potassium: 4.9 mmol/L (ref 3.5–5.1)
Sodium: 123 mmol/L — ABNORMAL LOW (ref 135–145)

## 2021-08-05 LAB — TSH: TSH: 3.119 u[IU]/mL (ref 0.350–4.500)

## 2021-08-05 LAB — GLUCOSE, CAPILLARY
Glucose-Capillary: 230 mg/dL — ABNORMAL HIGH (ref 70–99)
Glucose-Capillary: 232 mg/dL — ABNORMAL HIGH (ref 70–99)
Glucose-Capillary: 227 mg/dL — ABNORMAL HIGH (ref 70–99)
Glucose-Capillary: 203 mg/dL — ABNORMAL HIGH (ref 70–99)

## 2021-08-05 LAB — OSMOLALITY: Osmolality: 280 mOsm/kg (ref 275–295)

## 2021-08-05 LAB — OSMOLALITY, URINE: Osmolality, Ur: 498 mOsm/kg (ref 300–900)

## 2021-08-05 LAB — CORTISOL: Cortisol, Plasma: 22.9 ug/dL

## 2021-08-05 LAB — URIC ACID: Uric Acid, Serum: 6.2 mg/dL (ref 2.5–7.1)

## 2021-08-05 LAB — SODIUM, URINE, RANDOM: Sodium, Ur: <10 mmol/L

## 2021-08-05 MED ORDER — INSULIN ASPART 100 UNIT/ML IJ SOLN
2.0000 [IU] | Freq: Three times a day (TID) | INTRAMUSCULAR | Status: DC
Start: 2021-08-05 — End: 2021-08-06
  Administered 2021-08-05 – 2021-08-06 (×3): 2 [IU] via SUBCUTANEOUS

## 2021-08-05 MED ORDER — METOPROLOL SUCCINATE ER 50 MG PO TB24
50.0000 mg | ORAL_TABLET | Freq: Every day | ORAL | Status: DC
Start: 2021-08-05 — End: 2021-08-06
  Administered 2021-08-05 – 2021-08-06 (×2): 50 mg via ORAL
  Filled 2021-08-05 (×2): qty 1

## 2021-08-05 MED ORDER — SODIUM CHLORIDE 0.9 % IV SOLN: INTRAVENOUS | Status: DC

## 2021-08-05 MED ORDER — INSULIN GLARGINE-YFGN 100 UNIT/ML ~~LOC~~ SOLN
20.0000 [IU] | Freq: Every day | SUBCUTANEOUS | Status: DC
Start: 2021-08-06 — End: 2021-08-06
  Administered 2021-08-06: 20 [IU] via SUBCUTANEOUS
  Filled 2021-08-05: qty 0.2

## 2021-08-05 MED ORDER — ONDANSETRON HCL 4 MG/2ML IJ SOLN
4.0000 mg | Freq: Four times a day (QID) | INTRAMUSCULAR | Status: DC | PRN
  Administered 2021-08-05: 4 mg via INTRAVENOUS
  Filled 2021-08-05: qty 2

## 2021-08-05 NOTE — TOC Progression Note (Signed)
Transition of Care (TOC) - Progression Note  ? ? ?Patient Details  ?Name: SHAILA GILCHREST ?MRN: [id number] ?Date of Birth: 1922-07-09 ? ?Transition of Care (TOC) CM/SW Contact  ?[name], LCSW ?Phone Number: ?08/05/2021, 8:16 AM ? ?Clinical Narrative:    ?Auth approved in [place]: plan auth ID: [id number], ref# [id number] 3 days: 3/15-3/17 ? ? ?Expected Discharge Plan: [place] ?Barriers to Discharge: Continued Medical Work up, SNF Pending bed offer ? ?Expected Discharge Plan and Services ?Expected Discharge Plan: [place] ?In-house Referral: Clinical Social Work ?  ?Post Acute Care Choice: [place] ?Living arrangements for the past 2 months: [place] ?                ?  ?  ?  ?  ?  ?  ?  ?  ?  ?  ? ? ?Social Determinants of Health (SDOH) Interventions ?  ? ?Readmission Risk Interventions ?Readmission Risk Prevention Plan 05/11/2021  ?Transportation Screening Complete  ?Home Care Screening Complete  ?Some recent data might be hidden  ? ? ?

## 2021-08-05 NOTE — Progress Notes (Signed)
?PROGRESS NOTE ? ?Tara Bradley  IZT:[id number] DOB: 01-23-1923 DOA: 08/02/2021 ?PCP: [name], MD  ? ?Brief Narrative: ?Patient is a 88 year old female with history of diabetes type 2, hypertension, hyperlipidemia, chronic diastolic CHF, A-fib on Xarelto who presented with a fall from home.  Patient lives alone and ambulatory at baseline.  She was recently admitted here for CHF exacerbation/viral pneumonia.  Imagings on presentation showed left intertrochanteric hip fracture.  Orthopedics consulted and she underwent intramedullary nailing on 3/12.  Postoperatively, she became hypotensive and anemic secondary to acute blood loss anemia.  Hospital course also remarkable for document of hyponatremia, urinary retention.  Plan is for discharge to a skilled nursing facility when medically stable. ? ? ?Assessment & Plan: ? ?Principal Problem: ?  Hip fracture ([place]) ?Active Problems: ?  Acute postoperative anemia due to expected blood loss ?  DM type 2, controlled, with complication ([place]) ?  Essential hypertension ?  Persistent atrial fibrillation ([place]) ?  Hyperlipidemia ?  Fall at home, initial encounter ?  Chronic combined systolic and diastolic heart failure ([place]) ?  DNR (do not resuscitate) ?  Acute urinary retention ?  Hyponatremia ? ? ?Assessment and Plan: ?* Hip fracture ([place]) ?-Apparently mechanical fall resulting in hip fracture ?-S/P intramedullary nailing ?-Pain management, supportive care ?-PT/OT recommending skilled nursing facility discharge ?-TOC team consulted for rehab placement ? ? ?Acute postoperative anemia due to expected blood loss ?Hemoglobin dropped from 12 to the range of 7. S/P transfusion with a unit  of PRBC during this hospitalization ? ?Hyponatremia ?Sodium dropped to 123.   She looks dehydrated.  Continue IV fluids.  This could be hypovolemic hyponatremia.We will also follow up SIADH panel ? ?Acute urinary retention ? Continue in and out and place Foley as per protocol if needed.   Started on tamsulosin. ? ?DNR (do not resuscitate) ?Code status is DNR ? ?Chronic combined systolic and diastolic heart failure ([place]) ?-recent echo with preserved EF, grade 2 diastolic dysfunction ?-Currently on the drier side.  Continue gentle IV fluids ? ?Fall at home, initial encounter ?-Appears to be a mechanical fall ?-She reports that she felt like the hip gave way prior to the fall ?-No other obvious injuries currently ? ?Hyperlipidemia ?-last LDL was 70  ?-She does not appear to be taking medications for this issue at this time ? ?Persistent atrial fibrillation ([place]) ?-Patient with chronic afib  ?-Continue home Cardizem and Toprol XL with reduced dose ?-On Xarelto for anticoagulation ? ?Essential hypertension ?-Blood pressure soft but she is asymptomatic ?-Continue diltiazem, metoprolol for Afib control ? ?DM type 2, controlled, with complication ([place]) ?-recent A1c was 8.6 ?-Take metformin at home and also insulin ?-Cover with sensitive-scale SSI.  Added Lantus for hyperglycemia ? ? ? ? ? ? ?Nutrition Problem: Increased nutrient needs ?Etiology: post-op healing ?  ? ?DVT prophylaxis:SCDs Start: 08/02/21 1242 ?Rivaroxaban (XARELTO) tablet 15 mg  ? ?  Code Status: DNR ? ?Family Communication: None at bedside.Daughter lives at [place] to contact for ? ?Patient status:Inpatient ? ?Patient is from :Home ? ?Anticipated discharge to:SNF ? ?Estimated DC date:1-2 days. ? ? ?Consultants: Orthopedics ? ?Procedures: Intramedullary nailing ? ?Antimicrobials:  ?Anti-infectives (From admission, onward)  ? ? Start     Dose/Rate Route Frequency Ordered Stop  ? 08/03/21 0600  ceFAZolin (ANCEF) IVPB 2g/100 mL premix       ? 2 g ?200 mL/hr over 30 Minutes Intravenous On call to O.R. 08/02/21 1217 08/02/21 1335  ? 08/02/21 1509  vancomycin (VANCOCIN)  powder  Status:  Discontinued       ?   As needed 08/02/21 1510 08/02/21 1533  ? ?  ? ? ?Subjective: ? ?Patient seen and examined at the bedside this morning.  Blood pressure  is better today.  Heart rate ranging in the range of 90-110s.  Complains of nausea today. ? ?Objective: ?Vitals:  ? 08/04/21 2013 08/04/21 2020 08/05/21 0556 08/05/21 0810  ?BP: (!) 83/52 (!) 97/59 115/70 114/67  ?Pulse: 89   95  ?Resp:    16  ?Temp: 98 ?F (36.7 ?C)   97.8 ?F (36.6 ?C)  ?TempSrc: Oral   Oral  ?SpO2: 95%   96%  ?Weight:      ?Height:      ? ? ?Intake/Output Summary (Last 24 hours) at 08/05/2021 1246 ?Last data filed at 08/05/2021 1016 ?Gross per 24 hour  ?Intake 481.59 ml  ?Output 400 ml  ?Net 81.59 ml  ? ?Filed Weights  ? 08/02/21 0843  ?Weight: 61.2 kg  ? ? ?Examination: ? ? ?General exam: Uncomfortable today with nausea, very deconditioned ?HEENT: PERRL ?Respiratory system:  no wheezes or crackles  ?Cardiovascular system: Irregular irregular rhythm ?Gastrointestinal system: Abdomen is nondistended, soft and nontender. ?Central nervous system: Alert and oriented ?Extremities: No edema, no clubbing ,no cyanosis, surgical wound on the left hip ?Skin: No rashes, no ulcers,no icterus   ? ? ?Data Reviewed: I have personally reviewed following labs and imaging studies ? ?CBC: ?Recent Labs  ?Lab 08/02/21 ?8502 08/03/21 ?0401 08/04/21 ?0308 08/04/21 ?1531 08/05/21 ?0200  ?WBC 11.0* 5.9 8.4  --  8.8  ?NEUTROABS 8.4*  --  5.6  --  6.9  ?HGB 12.3 7.4* 7.1* 8.7* 7.5*  ?HCT 37.8 23.5* 22.3* 26.2* 22.7*  ?MCV 93.6 94.4 93.3  --  91.2  ?PLT 251 177 157  --  131*  ? ?Basic Metabolic Panel: ?Recent Labs  ?Lab 08/02/21 ?7741 08/03/21 ?0401 08/04/21 ?0308 08/05/21 ?0200  ?NA 133* 133* 129* 123*  ?K 3.7 4.8 4.9 4.9  ?CL 96* 97* 95* 92*  ?CO2 '26 29 29 24  '$ ?GLUCOSE 299* 244* 248* 188*  ?BUN 25* 29* 36* 37*  ?CREATININE 0.90 1.04* 1.20* 1.14*  ?CALCIUM 8.9 8.1* 8.2* 8.0*  ? ? ? ?Recent Results (from the past 240 hour(s))  ?Resp Panel by RT-PCR (Flu A&B, Covid) Nasopharyngeal Swab     Status: None  ? Collection Time: 08/02/21  8:47 AM  ? Specimen: Nasopharyngeal Swab; Nasopharyngeal(NP) swabs in vial transport medium   ?Result Value Ref Range Status  ? SARS Coronavirus 2 by RT PCR NEGATIVE NEGATIVE Final  ?  Comment: (NOTE) ?SARS-CoV-2 target nucleic acids are NOT DETECTED. ? ?The SARS-CoV-2 RNA is generally detectable in upper respiratory ?specimens during the acute phase of infection. The lowest ?concentration of SARS-CoV-2 viral copies this assay can detect is ?138 copies/mL. A negative result does not preclude SARS-Cov-2 ?infection and should not be used as the sole basis for treatment or ?other patient management decisions. A negative result may occur with  ?improper specimen collection/handling, submission of specimen other ?than nasopharyngeal swab, presence of viral mutation(s) within the ?areas targeted by this assay, and inadequate number of viral ?copies(<138 copies/mL). A negative result must be combined with ?clinical observations, patient history, and epidemiological ?information. The expected result is Negative. ? ?Fact Sheet for Patients:  ?EntrepreneurPulse.com.au ? ?Fact Sheet for Healthcare Providers:  ?IncredibleEmployment.be ? ?This test is no t yet approved or cleared by the [place] FDA and  ?has  been authorized for detection and/or diagnosis of SARS-CoV-2 by ?FDA under an Emergency Use Authorization (EUA). This EUA will remain  ?in effect (meaning this test can be used) for the duration of the ?COVID-19 declaration under Section 564(b)(1) of the Act, 21 ?U.S.C.section 360bbb-3(b)(1), unless the authorization is terminated  ?or revoked sooner.  ? ? ?  ? Influenza A by PCR NEGATIVE NEGATIVE Final  ? Influenza B by PCR NEGATIVE NEGATIVE Final  ?  Comment: (NOTE) ?The Xpert Xpress SARS-CoV-2/FLU/RSV plus assay is intended as an aid ?in the diagnosis of influenza from Nasopharyngeal swab specimens and ?should not be used as a sole basis for treatment. Nasal washings and ?aspirates are unacceptable for Xpert Xpress SARS-CoV-2/FLU/RSV ?testing. ? ?Fact Sheet for  Patients: ?EntrepreneurPulse.com.au ? ?Fact Sheet for Healthcare Providers: ?IncredibleEmployment.be ? ?This test is not yet approved or cleared by the [place] and ?has been [place]

## 2021-08-05 NOTE — Plan of Care (Signed)
?  Problem: Education: ?Goal: Verbalization of understanding the information provided (i.e., activity precautions, restrictions, etc) will improve ?Outcome: Progressing ?  ?Problem: Activity: ?Goal: Ability to ambulate and perform ADLs will improve ?Outcome: Progressing ?  ?Problem: Pain Management: ?Goal: Pain level will decrease ?Outcome: Progressing ?  ?

## 2021-08-05 NOTE — Care Management Important Message (Signed)
Important Message ? ?Patient Details  ?Name: Tara Bradley ?MRN: [id number] ?Date of Birth: 08-06-1922 ? ? ?Medicare Important Message Given:  Yes ? ? ? ? ?Tara Bradley  Tara Bradley ?08/05/2021, 11:32 AM ?

## 2021-08-05 NOTE — Plan of Care (Signed)
  Problem: Education: Goal: Verbalization of understanding the information provided (i.e., activity precautions, restrictions, etc) will improve Outcome: Progressing   Problem: Activity: Goal: Ability to ambulate and perform ADLs will improve Outcome: Progressing   Problem: Clinical Measurements: Goal: Postoperative complications will be avoided or minimized Outcome: Progressing   Problem: Self-Concept: Goal: Ability to maintain and perform role responsibilities to the fullest extent possible will improve Outcome: Progressing   Problem: Pain Management: Goal: Pain level will decrease Outcome: Progressing   

## 2021-08-05 NOTE — Assessment & Plan Note (Addendum)
Sodium dropped to 123,now improving.   She looked dehydrated. Urine sodium less than 10.This could be hypovolemic hyponatremia.  Continue gentle IV fluids till tomorrow ?

## 2021-08-05 NOTE — Progress Notes (Signed)
Inpatient Diabetes Program Recommendations ? ?AACE/ADA: New Consensus Statement on Inpatient Glycemic Control (2015) ? ?Target Ranges:  Prepandial:   less than 140 mg/dL ?     Peak postprandial:   less than 180 mg/dL (1-2 hours) ?     Critically ill patients:  140 - 180 mg/dL  ? ?Lab Results  ?Component Value Date  ? GLUCAP 230 (H) 08/05/2021  ? HGBA1C 8.6 (H) 05/03/2021  ? ? ?Review of Glycemic Control ? Latest Reference Range & Units 08/04/21 07:30 08/04/21 11:49 08/04/21 16:58 08/04/21 19:37 08/05/21 08:12  ?Glucose-Capillary 70 - 99 mg/dL 229 (H) 325 (H) 270 (H) 252 (H) 230 (H)  ?(H): Data is abnormally high ? ?Diabetes history: DM2 ?Outpatient Diabetes medications: Humalog 50/50 5 units QAM, 4 units QPM, Metformin 1000 mg QHS ?Current orders for Inpatient glycemic control: Semglee 15 units QD, Novolog 0-9 units TID ? ?Inpatient Diabetes Program Recommendations:   ? ?Semglee 20 units QD ?Carb modified diet ?Novolog 2 units TID with meals if eats at least 50% ? ?Will continue to follow while inpatient. ? ?Thank you, ?Reche Dixon, MSN, RN ?Diabetes Coordinator ?Inpatient Diabetes Program ?[phone] (team pager from 8a-5p) ? ? ?

## 2021-08-05 NOTE — Progress Notes (Signed)
Physical Therapy Treatment ?Patient Details ?Name: Tara Bradley ?MRN: [id number] ?DOB: [birth year] ?Today's Date: 08/05/2021 ? ? ?History of Present Illness Pt is a 88 y/o female who presents s/p fall, sustaining left intertrochanteric hip fracture.  Orthopedics consulted and she underwent intramedullary nailing on 08/02/21. PMH significant for bradycardia, CA, DM II, HTN, osteoporosis, thoracic aortic aneurysm, pelvic fracture 2006, prior ORIF femur fracture (unspecified laterality). ? ?  ?PT Comments  ? ? Pt with good progress towards goals this session with steady progression of functional mobility requiring mod a for bed mobility and ambulation. Pt able to come to standing and ambulate from EOB to chair with min a to steady once standing and mod assist during ambulation for sequencing, balance and line management. Pt continues to be limited by pain and decreased activity tolerance. Current plan remains appropriate to address deficits and maximize functional independence and decrease caregiver burden. Pt continues to benefit from skilled PT services to progress toward functional mobility goals.  ? ?Pt with some c/o dizziness when coming to sit EOB. BP on arrival, supine 109/73, once seated EOB 109/95. O2 sats stable >90% throughout session. Dizziness resolved after 2 mins EOB, mobility progressed.  ?  ?Recommendations for follow up therapy are one component of a multi-disciplinary discharge planning process, led by the attending physician.  Recommendations may be updated based on patient status, additional functional criteria and insurance authorization. ? ?Follow Up Recommendations ? Skilled nursing-short term rehab (<3 hours/day) ?  ?  ?Assistance Recommended at Discharge Frequent or constant Supervision/Assistance  ?Patient can return home with the following Two people to help with walking and/or transfers;A lot of help with bathing/dressing/bathroom;Assist for transportation;Assistance with cooking/housework;A  lot of help with walking and/or transfers ?  ?Equipment Recommendations ? None recommended by PT  ?  ?Recommendations for Other Services   ? ? ?  ?Precautions / Restrictions Precautions ?Precautions: Fall ?Precaution Comments: Watch O2 ?Restrictions ?Weight Bearing Restrictions: Yes ?LLE Weight Bearing: Weight bearing as tolerated  ?  ? ?Mobility ? Bed Mobility ?Overal bed mobility: Needs Assistance ?Bed Mobility: Supine to Sit ?  ?  ?Supine to sit: HOB elevated, Min assist, Mod assist ?  ?  ?General bed mobility comments: assist for LE management and using rail with UE. ?  ? ?Transfers ?Overall transfer level: Needs assistance ?Equipment used: Rolling walker (2 wheels) ?Transfers: Sit to/from Stand ?Sit to Stand: Mod assist, From elevated surface ?  ?  ?  ?  ?  ?General transfer comment: mod assist to steady once standing ?  ? ?Ambulation/Gait ?Ambulation/Gait assistance: Mod assist ?Gait Distance (Feet): 5 Feet ?Assistive device: Rolling walker (2 wheels) ?Gait Pattern/deviations: Step-to pattern, Decreased stance time - left, Antalgic, Trunk flexed ?  ?  ?  ?General Gait Details: slow antalgic gait from EOB to chair. pt with good recall of sequencing. ? ? ?Stairs ?  ?  ?  ?  ?  ? ? ?Wheelchair Mobility ?  ? ?Modified Rankin (Stroke Patients Only) ?  ? ? ?  ?Balance Overall balance assessment: Needs assistance ?Sitting-balance support: Feet supported, No upper extremity supported ?Sitting balance-Leahy Scale: Fair ?Sitting balance - Comments: Pt. can sit without using UE. When doing LE exercises she reaches out for UE. Slight R lean, could be from bed positioning. ?Postural control: Right lateral lean ?Standing balance support: During functional activity, Bilateral upper extremity supported ?Standing balance-Leahy Scale: Poor ?  ?  ?  ?  ?  ?  ?  ?  ?  ?  ?  ?  ?  ? ?  ?  Cognition Arousal/Alertness: Awake/alert ?Behavior During Therapy: [facility] for tasks assessed/performed ?Overall Cognitive Status: Within Functional  Limits for tasks assessed ?  ?  ?  ?  ?  ?  ?  ?  ?  ?  ?  ?  ?  ?  ?  ?  ?  ?  ?  ? ?  ?Exercises   ? ?  ?General Comments   ?  ?  ? ?Pertinent Vitals/Pain Pain Assessment ?Pain Assessment: Faces ?Faces Pain Scale: Hurts even more ?Pain Location: LLE with mobility ?Pain Descriptors / Indicators: Operative site guarding, Sore, Grimacing, Guarding ?Pain Intervention(s): Limited activity within patient's tolerance, Monitored during session, Repositioned  ? ? ?Home Living   ?  ?  ?  ?  ?  ?  ?  ?  ?  ?   ?  ?Prior Function    ?  ?  ?   ? ?PT Goals (current goals can now be found in the care plan section) Acute Rehab PT Goals ?PT Goal Formulation: With patient ?Time For Goal Achievement: 08/17/21 ? ?  ?Frequency ? ? ? Min 3X/week ? ? ? ?  ?PT Plan Current plan remains appropriate  ? ? ?Co-evaluation   ?  ?  ?  ?  ? ?  ?AM-PAC PT "6 Clicks" Mobility   ?Outcome Measure ? Help needed turning from your back to your side while in a flat bed without using bedrails?: A Little ?Help needed moving from lying on your back to sitting on the side of a flat bed without using bedrails?: A Lot ?Help needed moving to and from a bed to a chair (including a wheelchair)?: A Lot ?Help needed standing up from a chair using your arms (e.g., wheelchair or bedside chair)?: A Lot ?Help needed to walk in hospital room?: A Lot ?Help needed climbing 3-5 steps with a railing? : Total ?6 Click Score: 12 ? ?  ?End of Session   ?  ?  ?  ?PT Visit Diagnosis: Pain;Difficulty in walking, not elsewhere classified (R26.2) ?Pain - Right/Left: Left ?Pain - part of body: Hip ?  ? ? ?Time: 1610-9604 ?PT Time Calculation (min) (ACUTE ONLY): 30 min ? ?Charges:  $Gait Training: 8-22 mins ?$Therapeutic Activity: 8-22 mins          ?          ? ?Audry Riles. PTA ?Acute Rehabilitation Services ?Office: [phone] ? ? ? ?Tara Bradley ?08/05/2021, 4:03 PM ? ?

## 2021-08-06 LAB — CBC WITH DIFFERENTIAL/PLATELET
Abs Immature Granulocytes: 0.09 10*3/uL — ABNORMAL HIGH (ref 0.00–0.07)
Basophils Absolute: 0 10*3/uL (ref 0.0–0.1)
Basophils Relative: 0 %
Eosinophils Absolute: 0.1 10*3/uL (ref 0.0–0.5)
Eosinophils Relative: 1 %
HCT: 22.8 % — ABNORMAL LOW (ref 36.0–46.0)
Hemoglobin: 7.7 g/dL — ABNORMAL LOW (ref 12.0–15.0)
Immature Granulocytes: 1 %
Lymphocytes Relative: 7 %
Lymphs Abs: 0.6 10*3/uL — ABNORMAL LOW (ref 0.7–4.0)
MCH: 30.7 pg (ref 26.0–34.0)
MCHC: 33.8 g/dL (ref 30.0–36.0)
MCV: 90.8 fL (ref 80.0–100.0)
Monocytes Absolute: 0.9 10*3/uL (ref 0.1–1.0)
Monocytes Relative: 11 %
Neutro Abs: 6.6 10*3/uL (ref 1.7–7.7)
Neutrophils Relative %: 80 %
Platelets: 174 10*3/uL (ref 150–400)
RBC: 2.51 MIL/uL — ABNORMAL LOW (ref 3.87–5.11)
RDW: 15.9 % — ABNORMAL HIGH (ref 11.5–15.5)
WBC: 8.3 10*3/uL (ref 4.0–10.5)
nRBC: 0 % (ref 0.0–0.2)

## 2021-08-06 LAB — TYPE AND SCREEN
ABO/RH(D): O POS
Antibody Screen: NEGATIVE
Unit division: 0
Unit division: 0

## 2021-08-06 LAB — BASIC METABOLIC PANEL
Anion gap: 6 (ref 5–15)
BUN: 28 mg/dL — ABNORMAL HIGH (ref 8–23)
CO2: 25 mmol/L (ref 22–32)
Calcium: 8.2 mg/dL — ABNORMAL LOW (ref 8.9–10.3)
Chloride: 96 mmol/L — ABNORMAL LOW (ref 98–111)
Creatinine, Ser: 0.84 mg/dL (ref 0.44–1.00)
GFR, Estimated: >60 mL/min (ref >60)
Glucose, Bld: 87 mg/dL (ref 70–99)
Potassium: 5.2 mmol/L — ABNORMAL HIGH (ref 3.5–5.1)
Sodium: 127 mmol/L — ABNORMAL LOW (ref 135–145)

## 2021-08-06 LAB — BPAM RBC
Blood Product Expiration Date: 202304052359
Blood Product Expiration Date: 202304092359
ISSUE DATE / TIME: 202303121247
ISSUE DATE / TIME: 202303140957
Unit Type and Rh: 5100
Unit Type and Rh: 5100

## 2021-08-06 LAB — GLUCOSE, CAPILLARY
Glucose-Capillary: 103 mg/dL — ABNORMAL HIGH (ref 70–99)
Glucose-Capillary: 143 mg/dL — ABNORMAL HIGH (ref 70–99)
Glucose-Capillary: 101 mg/dL — ABNORMAL HIGH (ref 70–99)
Glucose-Capillary: 65 mg/dL — ABNORMAL LOW (ref 70–99)
Glucose-Capillary: 154 mg/dL — ABNORMAL HIGH (ref 70–99)
Glucose-Capillary: 152 mg/dL — ABNORMAL HIGH (ref 70–99)

## 2021-08-06 MED ORDER — METOPROLOL SUCCINATE ER 50 MG PO TB24
50.0000 mg | ORAL_TABLET | Freq: Once | ORAL | Status: AC
  Administered 2021-08-06: 50 mg via ORAL
  Filled 2021-08-06: qty 1

## 2021-08-06 MED ORDER — DILTIAZEM HCL ER COATED BEADS 120 MG PO CP24
120.0000 mg | ORAL_CAPSULE | Freq: Once | ORAL | Status: AC
Start: 2021-08-06 — End: 2021-08-06
  Administered 2021-08-06: 120 mg via ORAL
  Filled 2021-08-06: qty 1

## 2021-08-06 MED ORDER — DILTIAZEM HCL ER COATED BEADS 180 MG PO CP24
360.0000 mg | ORAL_CAPSULE | Freq: Every day | ORAL | Status: DC
  Administered 2021-08-07 – 2021-08-08 (×2): 360 mg via ORAL
  Filled 2021-08-06 (×2): qty 2

## 2021-08-06 MED ORDER — BISACODYL 10 MG RE SUPP
10.0000 mg | Freq: Once | RECTAL | Status: AC
  Administered 2021-08-06: 10 mg via RECTAL
  Filled 2021-08-06: qty 1

## 2021-08-06 MED ORDER — METOPROLOL SUCCINATE ER 50 MG PO TB24
100.0000 mg | ORAL_TABLET | Freq: Every day | ORAL | Status: DC
Start: 2021-08-07 — End: 2021-08-07
  Administered 2021-08-07: 100 mg via ORAL
  Filled 2021-08-06: qty 2

## 2021-08-06 NOTE — Progress Notes (Signed)
? ? ? ?  Subjective: ? ?Patient reports pain as well controlled. Working well with PT on ambulation.  Tolerated dressing change with no issues. Denies distal n/t. No concerns. ? ?Objective:  ? ?VITALS:   ?Vitals:  ? 08/06/21 0030 08/06/21 0409 08/06/21 0511 08/06/21 0722  ?BP: (!) 119/99 111/83  110/71  ?Pulse:    (!) 117  ?Resp: _0 ?Temp:  (!) 97.5 ?F (36.4 ?C)  98.6 ?F (37 ?C)  ?TempSrc:  Oral  Oral  ?SpO2:  95%  97%  ?Weight:      ?Height:      ? ? ?Sensation intact distally ?Intact pulses distally ?Dorsiflexion/Plantar flexion intact ?Incision: incisions C/D, sutures intact. Moderate expected post op swell ing and bruising.  Small skin tears also present around the hip area, discussed with patient's nurse. ?Compartment soft ? ? ?Lab Results  ?Component Value Date  ? WBC 8.3 08/06/2021  ? HGB 7.7 (L) 08/06/2021  ? HCT 22.8 (L) 08/06/2021  ? MCV 90.8 08/06/2021  ? PLT 174 08/06/2021  ? ?BMET ?   ?Component Value Date/Time  ? NA 127 (L) 08/06/2021 0153  ? NA 140 07/17/2021 1229  ? K 5.2 (H) 08/06/2021 0153  ? CL 96 (L) 08/06/2021 0153  ? CO2 25 08/06/2021 0153  ? GLUCOSE 87 08/06/2021 0153  ? BUN 28 (H) 08/06/2021 0153  ? BUN 19 07/17/2021 1229  ? CREATININE 0.84 08/06/2021 0153  ? CREATININE 0.71 12/31/2015 1504  ? CALCIUM 8.2 (L) 08/06/2021 0153  ? EGFR 62 07/17/2021 1229  ? GFRNONAA >60 08/06/2021 0153  ? ? ? ? ?Xray: post op xrays show well reduced fx, components in good position without adverse features. ? ?Assessment/Plan: ?4 Days Post-Op  ? ?Principal Problem: ?  Hip fracture ([place]) ?Active Problems: ?  DM type 2, controlled, with complication ([place]) ?  Essential hypertension ?  Persistent atrial fibrillation ([place]) ?  Hyperlipidemia ?  Fall at home, initial encounter ?  Chronic combined systolic and diastolic heart failure ([place]) ?  DNR (do not resuscitate) ?  Acute postoperative anemia due to expected blood loss ?  Acute urinary retention ?  Hyponatremia ? ?Left intertroch femur fracture s/p ORIF with  CMN 3/12 ? ?  ?Post op recs: ?WB: WBAT LLE ?Abx: ancef x23 hours post op ?Imaging: PACU xrays ?Dressing: keep intact until follow up, change PRN if soiled or saturated. ?DVT prophylaxis: xarelto 61m POD1-2, then resume xarelto 143m(home dose) POD3 ?Follow up: 2 weeks after surgery [facility] ?Address[address] [address]?Office Phone: [phone] ? ? ?Arvel Oquinn A Johanny Segers ?08/06/2021, 8:56 AM ? ? ?DaCharlies ConstableMD ? ?Contact information:   ?Weekdays 7am-5pm epic message Dr. MaZachery Dakinsor call office for patient follow up: [phone] ?After hours and holidays please check [email or web address] for group call information for [facility] ? ?  ?

## 2021-08-06 NOTE — Progress Notes (Signed)
Physical Therapy Treatment ?Patient Details ?Name: Tara Bradley ?MRN: [id number] ?DOB: 1922/11/17 ?Today's Date: 08/06/2021 ? ? ?History of Present Illness Pt is a 88 y/o female who presents s/p fall, sustaining left intertrochanteric hip fracture.  Orthopedics consulted and she underwent intramedullary nailing on 08/02/21. PMH significant for bradycardia, CA, DM II, HTN, osteoporosis, thoracic aortic aneurysm, pelvic fracture 2006, prior ORIF femur fracture (unspecified laterality). ? ?  ?PT Comments  ? ? Pt with continued progress towards goals this session requiring up to mod assist for bed mobility and to come to standing at EOB, and grossly min assist for ambulation in room. Cues needed throughout for sequencing with LE for bed mobility and gait. Pt with good tolerance for LE therex for increased strength and ROM. Current plan remains appropriate to address deficits and maximize functional independence and decrease caregiver burden. Pt continues to benefit from skilled PT services to progress toward functional mobility goals.  ?  ?Recommendations for follow up therapy are one component of a multi-disciplinary discharge planning process, led by the attending physician.  Recommendations may be updated based on patient status, additional functional criteria and insurance authorization. ? ?Follow Up Recommendations ? Skilled nursing-short term rehab (<3 hours/day) ?  ?  ?Assistance Recommended at Discharge Frequent or constant Supervision/Assistance  ?Patient can return home with the following Two people to help with walking and/or transfers;A lot of help with bathing/dressing/bathroom;Assist for transportation;Assistance with cooking/housework;A lot of help with walking and/or transfers ?  ?Equipment Recommendations ? None recommended by PT  ?  ?Recommendations for Other Services   ? ? ?  ?Precautions / Restrictions Precautions ?Precautions: Fall ?Precaution Comments: Watch O2 ?Restrictions ?Weight Bearing  Restrictions: Yes ?LLE Weight Bearing: Weight bearing as tolerated  ?  ? ?Mobility ? Bed Mobility ?Overal bed mobility: Needs Assistance ?Bed Mobility: Supine to Sit ?  ?  ?Supine to sit: HOB elevated, Min assist, Mod assist ?  ?  ?General bed mobility comments: assist for LE management and using rail with UE. ?  ? ?Transfers ?Overall transfer level: Needs assistance ?Equipment used: Rolling walker (2 wheels) ?Transfers: Sit to/from Stand ?Sit to Stand: Mod assist, From elevated surface ?  ?  ?  ?  ?  ?General transfer comment: mod assist to power up and steady once standing ?  ? ?Ambulation/Gait ?Ambulation/Gait assistance: Min assist ?Gait Distance (Feet): 5 Feet ?Assistive device: Rolling walker (2 wheels) ?Gait Pattern/deviations: Step-to pattern, Decreased stance time - left, Antalgic, Trunk flexed ?  ?  ?  ?General Gait Details: slow antalgic gait from EOB to chair. pt with good recall of sequencing. ? ? ?Stairs ?  ?  ?  ?  ?  ? ? ?Wheelchair Mobility ?  ? ?Modified Rankin (Stroke Patients Only) ?  ? ? ?  ?Balance Overall balance assessment: Needs assistance ?Sitting-balance support: Feet supported, No upper extremity supported ?Sitting balance-Leahy Scale: Fair ?Sitting balance - Comments: Pt. can sit without using UE. When doing LE exercises she reaches out for UE. Slight R lean, could be from bed positioning. ?Postural control: Right lateral lean ?Standing balance support: During functional activity, Bilateral upper extremity supported ?Standing balance-Leahy Scale: Poor ?  ?  ?  ?  ?  ?  ?  ?  ?  ?  ?  ?  ?  ? ?  ?Cognition Arousal/Alertness: Awake/alert ?Behavior During Therapy: [facility] for tasks assessed/performed ?Overall Cognitive Status: Within Functional Limits for tasks assessed ?  ?  ?  ?  ?  ?  ?  ?  ?  ?  ?  ?  ?  ?  ?  ?  ?  ?  ?  ? ?  ?  Exercises General Exercises - Lower Extremity ?Long Arc Quad: AROM, Right, 10 reps, Seated ?Hip ABduction/ADduction: AROM, Both, 10 reps, Seated ?Hip  Flexion/Marching: AROM, Right, Left, 20 reps, Seated ?Toe Raises: AROM, Right, Left, 10 reps, Seated ? ?  ?General Comments   ?  ?  ? ?Pertinent Vitals/Pain Pain Assessment ?Pain Assessment: Faces ?Faces Pain Scale: Hurts even more ?Pain Location: LLE with mobility ?Pain Descriptors / Indicators: Operative site guarding, Sore, Grimacing, Guarding ?Pain Intervention(s): Limited activity within patient's tolerance, Monitored during session, Repositioned  ? ? ?Home Living   ?  ?  ?  ?  ?  ?  ?  ?  ?  ?   ?  ?Prior Function    ?  ?  ?   ? ?PT Goals (current goals can now be found in the care plan section) Acute Rehab PT Goals ?PT Goal Formulation: With patient ?Time For Goal Achievement: 08/17/21 ? ?  ?Frequency ? ? ? Min 3X/week ? ? ? ?  ?PT Plan Current plan remains appropriate  ? ? ?Co-evaluation   ?  ?  ?  ?  ? ?  ?AM-PAC PT "6 Clicks" Mobility   ?Outcome Measure ? Help needed turning from your back to your side while in a flat bed without using bedrails?: A Little ?Help needed moving from lying on your back to sitting on the side of a flat bed without using bedrails?: A Lot ?Help needed moving to and from a bed to a chair (including a wheelchair)?: A Lot ?Help needed standing up from a chair using your arms (e.g., wheelchair or bedside chair)?: A Lot ?Help needed to walk in hospital room?: A Little ?Help needed climbing 3-5 steps with a railing? : Total ?6 Click Score: 13 ? ?  ?End of Session Equipment Utilized During Treatment: Gait belt ?Activity Tolerance: Patient tolerated treatment well ?Patient left: in chair;with call bell/phone within reach;with family/visitor present ?Nurse Communication: Mobility status ?PT Visit Diagnosis: Pain;Difficulty in walking, not elsewhere classified (R26.2) ?Pain - Right/Left: Left ?Pain - part of body: Hip ?  ? ? ?Time: 9458-5929 ?PT Time Calculation (min) (ACUTE ONLY): 28 min ? ?Charges:  $Gait Training: 8-22 mins ?$Therapeutic Exercise: 8-22 mins          ?          ? ?Audry Riles. PTA ?Acute Rehabilitation Services ?Office: [phone] ? ? ? ?Betsey Holiday Yen Wandell ?08/06/2021, 2:31 PM ? ?

## 2021-08-06 NOTE — Progress Notes (Signed)
?PROGRESS NOTE ? ?Tara Bradley  MWU:[id number] DOB: January 20, 1923 DOA: 08/02/2021 ?PCP: [name], MD  ? ?Brief Narrative: ?Patient is a 88 year old female with history of diabetes type 2, hypertension, hyperlipidemia, chronic diastolic CHF, A-fib on Xarelto who presented with a fall from home.  Patient lives alone and ambulatory at baseline.  She was recently admitted here for CHF exacerbation/viral pneumonia.  Imagings on presentation showed left intertrochanteric hip fracture.  Orthopedics consulted and she underwent intramedullary nailing on 3/12.  Postoperatively, she became hypotensive and anemic secondary to acute blood loss anemia.  Hospital course also remarkable for document of hyponatremia, urinary retention.  Plan is for discharge to a skilled nursing facility when medically stable(after improvement in the sodium level, heart rate) ? ? ?Assessment & Plan: ? ?Principal Problem: ?  Hip fracture ([place]) ?Active Problems: ?  Acute postoperative anemia due to expected blood loss ?  DM type 2, controlled, with complication ([place]) ?  Essential hypertension ?  Persistent atrial fibrillation ([place]) ?  Hyperlipidemia ?  Fall at home, initial encounter ?  Chronic combined systolic and diastolic heart failure ([place]) ?  DNR (do not resuscitate) ?  Acute urinary retention ?  Hyponatremia ? ? ?Assessment and Plan: ?* Hip fracture ([place]) ?-Apparently mechanical fall resulting in hip fracture ?-S/P intramedullary nailing ?-Pain management, supportive care ?-PT/OT recommending skilled nursing facility discharge ?-TOC team consulted for rehab placement ? ? ?Acute postoperative anemia due to expected blood loss ?Hemoglobin dropped from 12 to the range of 7. S/P transfusion with a unit  of PRBC during this hospitalization ? ?Hyponatremia ?Sodium dropped to 123,now improving.   She looks dehydrated. Urine sodium less than 10. Continue IV fluids.  This could be hypovolemic hyponatremia ? ?Acute urinary retention ? Continue in  and out and place Foley as per protocol if needed.  Started on tamsulosin. ? ?DNR (do not resuscitate) ?Code status is DNR ? ?Chronic combined systolic and diastolic heart failure ([place]) ?-recent echo with preserved EF, grade 2 diastolic dysfunction ?-Currently on the drier side.  Continue gentle IV fluids ? ?Fall at home, initial encounter ?-Appears to be a mechanical fall ?-She reports that she felt like the hip gave way prior to the fall ?-No other obvious injuries currently ? ?Hyperlipidemia ?-last LDL was 70  ?-She does not appear to be taking medications for this issue at this time ? ?Persistent atrial fibrillation ([place]) ?-Patient with chronic afib , frequently going into RVR ?-Continue home Cardizem and Toprol XL ?-On Xarelto for anticoagulation ?-Monitor on telemetry ? ?Essential hypertension ?-Blood pressure soft but she is asymptomatic ?-Continue diltiazem, metoprolol for Afib control ? ?DM type 2, controlled, with complication ([place]) ?-recent A1c was 8.6 ?-Take metformin at home and also insulin ?-Cover with sensitive-scale SSI.  Added Lantus for hyperglycemia ? ? ? ? ? ? ?Nutrition Problem: Increased nutrient needs ?Etiology: post-op healing ?  ? ?DVT prophylaxis:SCDs Start: 08/02/21 1242 ?Rivaroxaban (XARELTO) tablet 15 mg  ? ?  Code Status: DNR ? ?Family Communication: None at bedside.Daughter lives at [place] to contact for ? ?Patient status:Inpatient ? ?Patient is from :Home ? ?Anticipated discharge to:SNF ? ?Estimated DC date:1-2 days. ? ? ?Consultants: Orthopedics ? ?Procedures: Intramedullary nailing ? ?Antimicrobials:  ?Anti-infectives (From admission, onward)  ? ? Start     Dose/Rate Route Frequency Ordered Stop  ? 08/03/21 0600  ceFAZolin (ANCEF) IVPB 2g/100 mL premix       ? 2 g ?200 mL/hr over 30 Minutes Intravenous On call to O.R. 08/02/21  1217 08/02/21 1335  ? 08/02/21 1509  vancomycin (VANCOCIN) powder  Status:  Discontinued       ?   As needed 08/02/21 1510 08/02/21 1533  ? ?   ? ? ?Subjective: ? ?Patient seen and examined at the bedside this morning.  Blood pressure is better, she feels better, nausea is better today but her heart rate was fast.  Heart rate ranging between 110s to 140s.  She is not having any urinary problems and passing urine spontaneously.  Still looks on the dry side, continue IV fluids ? ?Objective: ?Vitals:  ? 08/06/21 0409 08/06/21 0511 08/06/21 0722 08/06/21 1035  ?BP: 111/83  110/71 (!) 148/92  ?Pulse:   (!) 117 (!) 126  ?Resp: '15 17 17   '$ ?Temp: (!) 97.5 ?F (36.4 ?C)  98.6 ?F (37 ?C)   ?TempSrc: Oral  Oral   ?SpO2: 95%  97%   ?Weight:      ?Height:      ? ? ?Intake/Output Summary (Last 24 hours) at 08/06/2021 1126 ?Last data filed at 08/06/2021 0730 ?Gross per 24 hour  ?Intake 941.67 ml  ?Output 1200 ml  ?Net -258.33 ml  ? ?Filed Weights  ? 08/02/21 0843  ?Weight: 61.2 kg  ? ? ?Examination: ? ? ?General exam: Very deconditioned, extremely elderly, pleasant ?HEENT: PERRL ?Respiratory system:  no wheezes or crackles  ?Cardiovascular system: Irregularly irregular rhythm ?Gastrointestinal system: Abdomen is nondistended, soft and nontender. ?Central nervous system: Alert and oriented ?Extremities: No edema, no clubbing ,no cyanosis, surgical wound on the left hip ?Skin: No rashes, no ulcers,no icterus   ? ? ?Data Reviewed: I have personally reviewed following labs and imaging studies ? ?CBC: ?Recent Labs  ?Lab 08/02/21 ?9563 08/03/21 ?0401 08/04/21 ?0308 08/04/21 ?1531 08/05/21 ?0200 08/06/21 ?0153  ?WBC 11.0* 5.9 8.4  --  8.8 8.3  ?NEUTROABS 8.4*  --  5.6  --  6.9 6.6  ?HGB 12.3 7.4* 7.1* 8.7* 7.5* 7.7*  ?HCT 37.8 23.5* 22.3* 26.2* 22.7* 22.8*  ?MCV 93.6 94.4 93.3  --  91.2 90.8  ?PLT 251 177 157  --  131* 174  ? ?Basic Metabolic Panel: ?Recent Labs  ?Lab 08/02/21 ?8756 08/03/21 ?0401 08/04/21 ?0308 08/05/21 ?0200 08/06/21 ?0153  ?NA 133* 133* 129* 123* 127*  ?K 3.7 4.8 4.9 4.9 5.2*  ?CL 96* 97* 95* 92* 96*  ?CO2 '26 29 29 24 25  '$ ?GLUCOSE 299* 244* 248* 188* 87  ?BUN  25* 29* 36* 37* 28*  ?CREATININE 0.90 1.04* 1.20* 1.14* 0.84  ?CALCIUM 8.9 8.1* 8.2* 8.0* 8.2*  ? ? ? ?Recent Results (from the past 240 hour(s))  ?Resp Panel by RT-PCR (Flu A&B, Covid) Nasopharyngeal Swab     Status: None  ? Collection Time: 08/02/21  8:47 AM  ? Specimen: Nasopharyngeal Swab; Nasopharyngeal(NP) swabs in vial transport medium  ?Result Value Ref Range Status  ? SARS Coronavirus 2 by RT PCR NEGATIVE NEGATIVE Final  ?  Comment: (NOTE) ?SARS-CoV-2 target nucleic acids are NOT DETECTED. ? ?The SARS-CoV-2 RNA is generally detectable in upper respiratory ?specimens during the acute phase of infection. The lowest ?concentration of SARS-CoV-2 viral copies this assay can detect is ?138 copies/mL. A negative result does not preclude SARS-Cov-2 ?infection and should not be used as the sole basis for treatment or ?other patient management decisions. A negative result may occur with  ?improper specimen collection/handling, submission of specimen other ?than nasopharyngeal swab, presence of viral mutation(s) within the ?areas targeted by this assay, and inadequate  number of viral ?copies(<138 copies/mL). A negative result must be combined with ?clinical observations, patient history, and epidemiological ?information. The expected result is Negative. ? ?Fact Sheet for Patients:  ?EntrepreneurPulse.com.au ? ?Fact Sheet for Healthcare Providers:  ?IncredibleEmployment.be ? ?This test is no t yet approved or cleared by the [place] FDA and  ?has been authorized for detection and/or diagnosis of SARS-CoV-2 by ?FDA under an Emergency Use Authorization (EUA). This EUA will remain  ?in effect (meaning this test can be used) for the duration of the ?COVID-19 declaration under Section 564(b)(1) of the Act, 21 ?U.S.C.section 360bbb-3(b)(1), unless the authorization is terminated  ?or revoked sooner.  ? ? ?  ? Influenza A by PCR NEGATIVE NEGATIVE Final  ? Influenza B by PCR NEGATIVE  NEGATIVE Final  ?  Comment: (NOTE) ?The Xpert Xpress SARS-CoV-2/FLU/RSV plus assay is intended as an aid ?in the diagnosis of influenza from Nasopharyngeal swab specimens and ?should not be used as a sole basis for

## 2021-08-06 NOTE — TOC Progression Note (Signed)
Transition of Care (TOC) - Progression Note  ? ? ?Patient Details  ?Name: Tara Bradley ?MRN: [id number] ?Date of Birth: 1923/04/09 ? ?Transition of Care (TOC) CM/SW Contact  ?[name], LCSW ?Phone Number: ?08/06/2021, 9:48 AM ? ?Clinical Narrative:   Pt not stable for DC per MD.  Has bed at [facility] and insurance auth in place.  TOC will continue to follow. ? ? ? ?Expected Discharge Plan: [place] ?Barriers to Discharge: Continued Medical Work up, SNF Pending bed offer ? ?Expected Discharge Plan and Services ?Expected Discharge Plan: [place] ?In-house Referral: Clinical Social Work ?  ?Post Acute Care Choice: [place] ?Living arrangements for the past 2 months: [place] ?                ?  ?  ?  ?  ?  ?  ?  ?  ?  ?  ? ? ?Social Determinants of Health (SDOH) Interventions ?  ? ?Readmission Risk Interventions ?Readmission Risk Prevention Plan 05/11/2021  ?Transportation Screening Complete  ?Home Care Screening Complete  ?Some recent data might be hidden  ? ? ?

## 2021-08-06 NOTE — Plan of Care (Addendum)
Dr. At bedside, dressing changed and applied new dressing, new blister on left hip near incisional site. Mepilex applied to site. Continue q2hr turns.  ? ? ?Problem: Education: ?Goal: Verbalization of understanding the information provided (i.e., activity precautions, restrictions, etc) will improve ?Outcome: Progressing ?  ?Problem: Activity: ?Goal: Ability to ambulate and perform ADLs will improve ?Outcome: Progressing ?  ?Problem: Pain Management: ?Goal: Pain level will decrease ?Outcome: Progressing ?  ?Problem: Activity: ?Goal: Risk for activity intolerance will decrease ?Outcome: Progressing ?  ?Problem: Pain Managment: ?Goal: General experience of comfort will improve ?Outcome: Progressing ?  ?Problem: Safety: ?Goal: Ability to remain free from injury will improve ?Outcome: Progressing ?  ?Problem: Skin Integrity: ?Goal: Risk for impaired skin integrity will decrease ?Outcome: Progressing ?  ?

## 2021-08-07 ENCOUNTER — Encounter (HOSPITAL_COMMUNITY): Payer: Self-pay | Admitting: Internal Medicine

## 2021-08-07 DIAGNOSIS — I5032 Chronic diastolic (congestive) heart failure: Secondary | ICD-10-CM

## 2021-08-07 DIAGNOSIS — E875 Hyperkalemia: Secondary | ICD-10-CM

## 2021-08-07 DIAGNOSIS — R0689 Other abnormalities of breathing: Secondary | ICD-10-CM

## 2021-08-07 DIAGNOSIS — N179 Acute kidney failure, unspecified: Secondary | ICD-10-CM

## 2021-08-07 DIAGNOSIS — I482 Chronic atrial fibrillation, unspecified: Secondary | ICD-10-CM

## 2021-08-07 LAB — CBC WITH DIFFERENTIAL/PLATELET
Abs Immature Granulocytes: 0.03 10*3/uL (ref 0.00–0.07)
Basophils Absolute: 0 10*3/uL (ref 0.0–0.1)
Basophils Relative: 0 %
Eosinophils Absolute: 0.1 10*3/uL (ref 0.0–0.5)
Eosinophils Relative: 1 %
HCT: 25.3 % — ABNORMAL LOW (ref 36.0–46.0)
Hemoglobin: 8 g/dL — ABNORMAL LOW (ref 12.0–15.0)
Immature Granulocytes: 1 %
Lymphocytes Relative: 12 %
Lymphs Abs: 0.7 10*3/uL (ref 0.7–4.0)
MCH: 30.2 pg (ref 26.0–34.0)
MCHC: 31.6 g/dL (ref 30.0–36.0)
MCV: 95.5 fL (ref 80.0–100.0)
Monocytes Absolute: 0.8 10*3/uL (ref 0.1–1.0)
Monocytes Relative: 13 %
Neutro Abs: 4.5 10*3/uL (ref 1.7–7.7)
Neutrophils Relative %: 73 %
Platelets: 201 10*3/uL (ref 150–400)
RBC: 2.65 MIL/uL — ABNORMAL LOW (ref 3.87–5.11)
RDW: 16.7 % — ABNORMAL HIGH (ref 11.5–15.5)
WBC: 6.2 10*3/uL (ref 4.0–10.5)
nRBC: 0 % (ref 0.0–0.2)

## 2021-08-07 LAB — GLUCOSE, CAPILLARY
Glucose-Capillary: 74 mg/dL (ref 70–99)
Glucose-Capillary: 153 mg/dL — ABNORMAL HIGH (ref 70–99)
Glucose-Capillary: 148 mg/dL — ABNORMAL HIGH (ref 70–99)
Glucose-Capillary: 161 mg/dL — ABNORMAL HIGH (ref 70–99)

## 2021-08-07 LAB — BASIC METABOLIC PANEL
Anion gap: 6 (ref 5–15)
BUN: 20 mg/dL (ref 8–23)
CO2: 26 mmol/L (ref 22–32)
Calcium: 8.2 mg/dL — ABNORMAL LOW (ref 8.9–10.3)
Chloride: 99 mmol/L (ref 98–111)
Creatinine, Ser: 0.67 mg/dL (ref 0.44–1.00)
GFR, Estimated: >60 mL/min (ref >60)
Glucose, Bld: 93 mg/dL (ref 70–99)
Potassium: 5.4 mmol/L — ABNORMAL HIGH (ref 3.5–5.1)
Sodium: 131 mmol/L — ABNORMAL LOW (ref 135–145)

## 2021-08-07 LAB — POTASSIUM: Potassium: 4.9 mmol/L (ref 3.5–5.1)

## 2021-08-07 LAB — MAGNESIUM: Magnesium: 2 mg/dL (ref 1.7–2.4)

## 2021-08-07 MED ORDER — SODIUM ZIRCONIUM CYCLOSILICATE 10 G PO PACK
10.0000 g | PACK | Freq: Once | ORAL | Status: AC
  Administered 2021-08-07: 10 g via ORAL
  Filled 2021-08-07: qty 1

## 2021-08-07 MED ORDER — FUROSEMIDE 40 MG PO TABS: 40.0000 mg | ORAL_TABLET | Freq: Every day | ORAL | Status: DC

## 2021-08-07 MED ORDER — SENNA 8.6 MG PO TABS: 1.0000 | ORAL_TABLET | Freq: Two times a day (BID) | ORAL | 0 refills | Status: AC

## 2021-08-07 MED ORDER — METOPROLOL SUCCINATE ER 50 MG PO TB24
100.0000 mg | ORAL_TABLET | Freq: Two times a day (BID) | ORAL | Status: DC
Start: 2021-08-07 — End: 2021-08-08
  Administered 2021-08-07 – 2021-08-08 (×2): 100 mg via ORAL
  Filled 2021-08-07 (×2): qty 2

## 2021-08-07 MED ORDER — DIGOXIN 62.5 MCG PO TABS: 0.0625 mg | ORAL_TABLET | Freq: Every day | ORAL | Status: DC

## 2021-08-07 MED ORDER — FUROSEMIDE 40 MG PO TABS
40.0000 mg | ORAL_TABLET | Freq: Every day | ORAL | Status: DC
  Administered 2021-08-07 – 2021-08-08 (×2): 40 mg via ORAL
  Filled 2021-08-07 (×2): qty 1

## 2021-08-07 MED ORDER — SODIUM CHLORIDE 0.9 % IV SOLN: INTRAVENOUS | Status: DC

## 2021-08-07 MED ORDER — TAMSULOSIN HCL 0.4 MG PO CAPS: 0.4000 mg | ORAL_CAPSULE | Freq: Every day | ORAL | Status: DC

## 2021-08-07 MED ORDER — DIGOXIN 125 MCG PO TABS
0.0625 mg | ORAL_TABLET | Freq: Every day | ORAL | Status: DC
  Administered 2021-08-07 – 2021-08-08 (×2): 0.0625 mg via ORAL
  Filled 2021-08-07 (×2): qty 1

## 2021-08-07 NOTE — Progress Notes (Addendum)
?PROGRESS NOTE ? ?Tara Bradley  WGY:[id number] DOB: 02-03-1923 DOA: 08/02/2021 ?PCP: [name], MD  ? ?Brief Narrative: ?Patient is a 88 year old female with history of diabetes type 2, hypertension, hyperlipidemia, chronic diastolic CHF, A-fib on Xarelto who presented with a fall from home.  Patient lives alone and ambulatory at baseline.  She was recently admitted here for CHF exacerbation/viral pneumonia.  Imagings on presentation showed left intertrochanteric hip fracture.  Orthopedics consulted and she underwent intramedullary nailing on 3/12.  Postoperatively, she became hypotensive and anemic secondary to acute blood loss anemia.  Hospital course also remarkable for document of hyponatremia, urinary retention,afib with RVR.  Her heart rate remained uncontrolled despite putting her on maximal oral medications.  Needed to call cardiology consultation today.  Plan is for discharge to a skilled nursing facility when medically stable. ? ?Assessment & Plan: ? ?Principal Problem: ?  Hip fracture ([place]) ?Active Problems: ?  Acute postoperative anemia due to expected blood loss ?  Persistent atrial fibrillation ([place]) ?  Hyponatremia ?  Respiratory insufficiency ?  DM type 2, controlled, with complication ([place]) ?  Essential hypertension ?  Hyperlipidemia ?  Chronic combined systolic and diastolic heart failure ([place]) ?  DNR (do not resuscitate) ?  Acute urinary retention ?  Hyperkalemia ? ? ?Assessment and Plan: ?* Hip fracture ([place]) ?-Apparently mechanical fall resulting in hip fracture ?-S/P intramedullary nailing ?-Pain management, supportive care ?-PT/OT recommending skilled nursing facility discharge ?-TOC team consulted for rehab placement ? ? ?Acute postoperative anemia due to expected blood loss ?Hemoglobin dropped from 12 to the range of 7. S/P transfusion with a unit  of PRBC during this hospitalization.  Currently stable in the range of 8 ? ?Persistent atrial fibrillation ([place]) ?-Patient with chronic  afib , frequently going into RVR ?-Continue home Cardizem and Toprol XL ?-On Xarelto for anticoagulation ?-Monitor on telemetry ?-Needed to call cardiology consultation today.  Heart rate remains in the range of 130s/140s despite being on above medications ? ?Hyponatremia ?Sodium dropped to 123,now improving.   She looked dehydrated. Urine sodium less than 10.This could be hypovolemic hyponatremia.  Continue gentle IV fluids till tomorrow ? ?Respiratory insufficiency ?Currently requiring 2 L of oxygen per minute.  On auscultation there are no wheezes or crackles.  Chest x-ray showed possible atelectasis.  Continue incentive spirometry. ? ?Hyperkalemia ?Being given Lokelma.  Check BMP tomorrow ? ?Acute urinary retention ?Started on tamsulosin.Improved ? ?DNR (do not resuscitate) ?Code status is DNR ? ?Chronic combined systolic and diastolic heart failure ([place]) ?-recent echo with preserved EF, grade 2 diastolic dysfunction ?-She was dehydrated and was given IV fluids ? ?Hyperlipidemia ?-last LDL was 70  ?-She does not appear to be taking medications for this issue at this time ? ?Essential hypertension ?-Currently on Cardizem and metoprolol.  Continue monitoring ? ?DM type 2, controlled, with complication ([place]) ?-recent A1c was 8.6 ?-Take metformin at home and also insulin ?-Cover with sensitive-scale SSI. ? ?Fall at home, initial encounter-resolved as of 08/07/2021 ?-Appears to be a mechanical fall ?-She reports that she felt like the hip gave way prior to the fall ?-No other obvious injuries currently ? ? ? ? ? ? ?Nutrition Problem: Increased nutrient needs ?Etiology: post-op healing ?  ? ?DVT prophylaxis:SCDs Start: 08/02/21 1242 ?Rivaroxaban (XARELTO) tablet 15 mg  ? ?  Code Status: DNR ? ?Family Communication: Niece at bedside ?Patient status:Inpatient ? ?Patient is from :Home ? ?Anticipated discharge to:SNF ? ?Estimated DC date:1-2 days.  Needs better heart rate control  before discharge ? ? ?Consultants:  Orthopedics ? ?Procedures: Intramedullary nailing ? ?Antimicrobials:  ?Anti-infectives (From admission, onward)  ? ? Start     Dose/Rate Route Frequency Ordered Stop  ? 08/03/21 0600  ceFAZolin (ANCEF) IVPB 2g/100 mL premix       ? 2 g ?200 mL/hr over 30 Minutes Intravenous On call to O.R. 08/02/21 1217 08/02/21 1335  ? 08/02/21 1509  vancomycin (VANCOCIN) powder  Status:  Discontinued       ?   As needed 08/02/21 1510 08/02/21 1533  ? ?  ? ? ?Subjective: ? ?Patient seen and examined at the bedside this morning.  Remains in A-fib with RVR.  Denies any chest pain or shortness of breath or palpitations.  She feels better today.  Niece at the bedside. ? ? ?Objective: ?Vitals:  ? 08/07/21 0645 08/07/21 0724 08/07/21 0826 08/07/21 1033  ?BP: 131/88 107/78 125/88 117/76  ?Pulse: (!) 132 (!) 124 (!) 135 (!) 128  ?Resp: '18 17 18 17  '$ ?Temp: 98 ?F (36.7 ?C) 98.2 ?F (36.8 ?C)    ?TempSrc: Oral Oral    ?SpO2: 97% 99% 96% 96%  ?Weight:      ?Height:      ? ? ?Intake/Output Summary (Last 24 hours) at 08/07/2021 1120 ?Last data filed at 08/07/2021 0900 ?Gross per 24 hour  ?Intake 3969.43 ml  ?Output 1400 ml  ?Net 2569.43 ml  ? ?Filed Weights  ? 08/02/21 0843  ?Weight: 61.2 kg  ? ? ?Examination: ? ? ?General exam: Very deconditioned, pleasant extremely elderly female ?HEENT: PERRL ?Respiratory system: Diminished air sounds on bases ,no wheezes or crackles  ?Cardiovascular system: A-fib with RVR ?Gastrointestinal system: Abdomen is nondistended, soft and nontender. ?Central nervous system: Alert and oriented ?Extremities: No edema, no clubbing ,no cyanosis, surgical wound on the left hip ?Skin: No rashes, no ulcers,no icterus   ? ? ?Data Reviewed: I have personally reviewed following labs and imaging studies ? ?CBC: ?Recent Labs  ?Lab 08/02/21 ?2952 08/03/21 ?0401 08/04/21 ?0308 08/04/21 ?1531 08/05/21 ?0200 08/06/21 ?0153 08/07/21 ?0224  ?WBC 11.0* 5.9 8.4  --  8.8 8.3 6.2  ?NEUTROABS 8.4*  --  5.6  --  6.9 6.6 4.5  ?HGB 12.3 7.4*  7.1* 8.7* 7.5* 7.7* 8.0*  ?HCT 37.8 23.5* 22.3* 26.2* 22.7* 22.8* 25.3*  ?MCV 93.6 94.4 93.3  --  91.2 90.8 95.5  ?PLT 251 177 157  --  131* 174 201  ? ?Basic Metabolic Panel: ?Recent Labs  ?Lab 08/03/21 ?0401 08/04/21 ?0308 08/05/21 ?0200 08/06/21 ?0153 08/07/21 ?8413  ?NA 133* 129* 123* 127* 131*  ?K 4.8 4.9 4.9 5.2* 5.4*  ?CL 97* 95* 92* 96* 99  ?CO2 '29 29 24 25 26  '$ ?GLUCOSE 244* 248* 188* 87 93  ?BUN 29* 36* 37* 28* 20  ?CREATININE 1.04* 1.20* 1.14* 0.84 0.67  ?CALCIUM 8.1* 8.2* 8.0* 8.2* 8.2*  ?MG  --   --   --   --  2.0  ? ? ? ?Recent Results (from the past 240 hour(s))  ?Resp Panel by RT-PCR (Flu A&B, Covid) Nasopharyngeal Swab     Status: None  ? Collection Time: 08/02/21  8:47 AM  ? Specimen: Nasopharyngeal Swab; Nasopharyngeal(NP) swabs in vial transport medium  ?Result Value Ref Range Status  ? SARS Coronavirus 2 by RT PCR NEGATIVE NEGATIVE Final  ?  Comment: (NOTE) ?SARS-CoV-2 target nucleic acids are NOT DETECTED. ? ?The SARS-CoV-2 RNA is generally detectable in upper respiratory ?specimens during the acute phase of  infection. The lowest ?concentration of SARS-CoV-2 viral copies this assay can detect is ?138 copies/mL. A negative result does not preclude SARS-Cov-2 ?infection and should not be used as the sole basis for treatment or ?other patient management decisions. A negative result may occur with  ?improper specimen collection/handling, submission of specimen other ?than nasopharyngeal swab, presence of viral mutation(s) within the ?areas targeted by this assay, and inadequate number of viral ?copies(<138 copies/mL). A negative result must be combined with ?clinical observations, patient history, and epidemiological ?information. The expected result is Negative. ? ?Fact Sheet for Patients:  ?EntrepreneurPulse.com.au ? ?Fact Sheet for Healthcare Providers:  ?IncredibleEmployment.be ? ?This test is no t yet approved or cleared by the [place] FDA and  ?has been  authorized for detection and/or diagnosis of SARS-CoV-2 by ?FDA under an Emergency Use Authorization (EUA). This EUA will remain  ?in effect (meaning this test can be used) for the duration of the ?COVID-19 decl

## 2021-08-07 NOTE — Assessment & Plan Note (Signed)
Currently requiring 2 L of oxygen per minute.  On auscultation there are no wheezes or crackles.  Chest x-ray showed possible atelectasis.  Continue incentive spirometry. ?

## 2021-08-07 NOTE — Plan of Care (Signed)
  Problem: Education: Goal: Verbalization of understanding the information provided (i.e., activity precautions, restrictions, etc) will improve Outcome: Progressing Goal: Individualized Educational Video(s) Outcome: Progressing   

## 2021-08-07 NOTE — Assessment & Plan Note (Signed)
Being given Lokelma.  Check BMP tomorrow ?

## 2021-08-07 NOTE — TOC Progression Note (Addendum)
Transition of Care (TOC) - Progression Note  ? ? ?Patient Details  ?Name: Tara Bradley ?MRN: [id number] ?Date of Birth: [birth year] ? ?Transition of Care (TOC) CM/SW Contact  ?[name], LCSW ?Phone Number: ?08/07/2021, 11:53 AM ? ?Clinical Narrative:   Pt not stable for DC to [facility] today per MD.  Josem Kaufmann expires today but [facility] has grace period for pt to admit tomorrow under the current auth.  Will need new auth submitted if pt does not DC tomorrow.  Star at [facility] informed, they are able to accept pt for admit over the weekend if she is DC. ? ?1310: CSW spoke with pt, she is aware that DC has been delayed but still hopeful MD may release her today.  CSW spoke with pt nephew Rush Landmark and updated him on the above as well.  ? ?1455: CSW contacted by MD that pt has been cleared for DC.  CSW contacted Star at [facility] but they cannot take her at this point today, their admission RN is gone, will plan to take her tomorrow.  ? ?Expected Discharge Plan: [employer] ?Barriers to Discharge: Continued Medical Work up, SNF Pending bed offer ? ?Expected Discharge Plan and Services ?Expected Discharge Plan: [place] ?In-house Referral: Clinical Social Work ?  ?Post Acute Care Choice: [place] ?Living arrangements for the past 2 months: [place] ?                ?  ?  ?  ?  ?  ?  ?  ?  ?  ?  ? ? ?Social Determinants of Health (SDOH) Interventions ?  ? ?Readmission Risk Interventions ?Readmission Risk Prevention Plan 05/11/2021  ?Transportation Screening Complete  ?Home Care Screening Complete  ?Some recent data might be hidden  ? ? ?

## 2021-08-07 NOTE — Consult Note (Addendum)
?Cardiology Consultation:  ? ?Patient ID: Tara Bradley ?MRN: [id number]; DOB: [birth year] ? ?Admit date: 08/02/2021 ?Date of Consult: 08/07/2021 ? ?PCP:  [name], MD ?  ?[facility] Providers ?Cardiologist:  [name], MD   { ? ?Patient Profile:  ? ?Tara Bradley is a 88 y.o. female with a history of chronic atrial fibrillation Xarelto, chronic diastolic CHF, aortic aneurysm with chronic descending dissection, aortic valve sclerosis, hypertension, hyperlipidemia, type 2 diabetes mellitus  who is being seen 08/07/2021 for the evaluation of atrial fibrillation at the request of Dr. Tawanna Solo. ? ?History of Present Illness:  ? ?Tara Bradley is 88 year old female with the above history who is followed by Dr. Percival Spanish. Patient has had hospitalization over the last few months. She was admitted in 04/2021 with a right humerus fracture after a fall and underwent reverse shoulder arthroplasty. Hospitalization was complicated by acute on chronic diastolic CHF and uncontrolled atrial fibrillation. She was readmitted from 06/22/2021 to 06/26/2021 acute on chronic diastolic CHF and possible viral pneumonia. Echo showed LVEF 55-60% with normal wall motion and grade II diastolic dysfunction.  RV normal. Also showed mild MR, moderate to severe TR, and moderately elevated PASP of 52.1 mmHg. She was diuresed. Rates were again difficult to control during admission and Metoprolol was increased. ? ?Patient was last seen by Dr. Percival Spanish on 07/17/2021 for hospital follow-up at which time she was doing relatively well and denied any new shortness of breath or any chest discomfort. Rates were controlled. ? ?Patient presented to [facility] ED on 08/02/2021 for further evaluation of left hip pain after a fall at home and was found to have a displaced fracture of the proximal left femoral diaphysis with shortening of at least 4cm. Remote fractures of the right femur and bilateral pubic rami were also seen. Ortho was consulted and  patient underwent ORIF on 08/02/2021. Postoperatively, she became hypotension and anemic secondary to acute blood loss anemia with hemoglobin dropping from 12.3 to as low as 7.1 s/p 1 unit of PRBC on 08/04/2021. She also developed acute urinary retention requiring Foley placement. She has been frequently going in rapid atrial fibrillation so Cardiology consulted today for further evaluation.  ? ?Per review of telemetry, patient in atrial fibrillation with rates ranging from the 90s to 130s. Currently in the low 100s to 110s. ? ?At the time of this evaluation, patient resting comfortably in no acute distress. Discussed events surrounding her fall. It was a mechanical fall. She thinks her leg just gave out on her. She denies any symptoms prior to her fall. It sounds like she was doing relatively well from a cardiac standpoint prior to admission. She reports intermittent shortness of breath, mostly with exertion, at home and her PCP had increased her Lasix to '40mg'$  daily but this had caused a lot of leg cramping. However, she denied any chest pain, orthopnea, PND, lower extremity edema, palpitations, lightheadedness, dizziness, or syncope. She denies any recent fevers or illnesses. She denies any abnormal bleeding in urine or stools. She denies frequent falls other than the one from 04/2021. She reports the chronic fracture of her right hip are from a fall about 10 years ago. She uses a walker at home when ambulating farther distances. ? ?Past Medical History:  ?Diagnosis Date  ? Aortic valve sclerosis   ? Echo, 2008  ? Arthritis   ? Bradycardia   ? October, 2012  ? Cancer [facility])   ? Chronic insomnia   ? Closed fracture  of unspecified part of femur 2005  ? Diabetes mellitus, type 2 ([place])   ? Diverticulitis   ? Diverticulosis   ? Dyspnea   ? Hyperlipidemia   ? Hypertension   ? Long term (current) use of anticoagulants   ? Osteopenia   ? Osteoporosis   ? femur fracture 2005, pelvic fracture 2006  ? Persistent atrial  fibrillation ([place])   ? Personal history of malignant neoplasm of breast   ? Pneumonia   ? Syncope   ? Thoracic aortic aneurysm 02/2017  ? Unspecified closed fracture of pelvis 2006  ? ? ?Past Surgical History:  ?Procedure Laterality Date  ? CARDIOVERSION N/A 08/09/2012  ? Procedure: CARDIOVERSION;  Surgeon: Deboraha Sprang, MD;  Location: [place];  Service: Cardiovascular;  Laterality: N/A;  ? EYE SURGERY    ? FEMUR SURGERY  2005  ? ORIF  ? INTRAMEDULLARY (IM) NAIL INTERTROCHANTERIC Left 08/02/2021  ? Procedure: INTRAMEDULLARY (IM) NAIL INTERTROCHANTRIC;  Surgeon: Willaim Sheng, MD;  Location: [place];  Service: Orthopedics;  Laterality: Left;  ? IR ANGIOGRAM PELVIS SELECTIVE OR SUPRASELECTIVE  03/11/2017  ? IR ANGIOGRAM SELECTIVE EACH ADDITIONAL VESSEL  03/11/2017  ? IR EMBO ART  VEN HEMORR LYMPH EXTRAV  INC GUIDE ROADMAPPING  03/11/2017  ? IR FLUORO GUIDE CV LINE RIGHT  03/11/2017  ? IR US GUIDE VASC ACCESS RIGHT  03/11/2017  ? IR US GUIDE VASC ACCESS RIGHT  03/11/2017  ? MASTECTOMY  1995  ? left  ? MINOR HARDWARE REMOVAL Left 09/01/2020  ? Procedure: REMOVAL OF HARDWARE LEFT FOREARM;  Surgeon: Milly Jakob, MD;  Location: [place];  Service: Orthopedics;  Laterality: Left;  ? ORIF ULNAR FRACTURE Left 10/19/2019  ? Procedure: OPEN REDUCTION INTERNAL FIXATION (ORIF)  LEFT DISTAL RADIUS  AND ULNA FRACTURE;  Surgeon: Milly Jakob, MD;  Location: [place];  Service: Orthopedics;  Laterality: Left;  ? REVERSE SHOULDER ARTHROPLASTY Right 05/04/2021  ? Procedure: REVERSE SHOULDER ARTHROPLASTY;  Surgeon: Netta Cedars, MD;  Location: [place];  Service: Orthopedics;  Laterality: Right;  ? SHOULDER SURGERY    ? left  ? [place], 2003  ? TONSILLECTOMY    ? TOTAL ABDOMINAL HYSTERECTOMY W/ BILATERAL SALPINGOOPHORECTOMY  1995  ? WRIST SURGERY    ?  x 2   ?  ? ?Home Medications:  ?Prior to Admission medications   ?Medication Sig Start Date End Date Taking? Authorizing Provider  ?acetaminophen  (TYLENOL) 500 MG tablet Take 2 tablets (1,000 mg total) by mouth every 8 (eight) hours as needed. 08/04/21 09/03/21 Yes Willaim Sheng, MD  ?Calcium Carbonate-Vitamin D 600-400 MG-UNIT tablet Take 1 tablet by mouth daily.   Yes [provider]  ?Cholecalciferol (VITAMIN D) 2000 UNITS tablet Take 1,000 Units by mouth daily.   Yes [provider]  ?diltiazem (CARDIZEM CD) 360 MG 24 hr capsule Take 1 capsule (360 mg total) by mouth daily. 05/12/21  Yes Shelly Coss, MD  ?furosemide (LASIX) 40 MG tablet TAKE ONE TABLET BY MOUTH DAILY ?Patient taking differently: Take 40 mg by mouth daily. 07/29/21  Yes [name], MD  ?Insulin Lispro Prot & Lispro (HUMALOG MIX 50/50 KWIKPEN) (50-50) 100 UNIT/ML Kwikpen Inject 4-5 Units into the skin in the morning and at bedtime. Take 5 units in the morning after breakfast and 4 units at night after supper.   Yes [provider]  ?Magnesium Oxide 250 MG TABS Take 250 mg by mouth daily.   Yes [provider]  ?  metFORMIN (GLUMETZA) 500 MG (MOD) 24 hr tablet Take 1,000 mg by mouth every evening.   Yes [provider]  ?methocarbamol (ROBAXIN) 500 MG tablet Take 1 tablet (500 mg total) by mouth every 8 (eight) hours as needed for up to 10 days for muscle spasms. 08/04/21 08/14/21 Yes Marchwiany, Virgina Norfolk, MD  ?metoprolol succinate (TOPROL-XL) 100 MG 24 hr tablet TAKE ONE TABLET BY MOUTH TWICE DAILY. TAKE WITH OR IMMEDIATELY FOLLOWING A MEAL ?Patient taking differently: Take 100 mg by mouth in the morning and at bedtime. 07/29/21  Yes [name], MD  ?multivitamin-lutein [facility]) CAPS capsule Take 1 capsule by mouth daily.   Yes [provider]  ?oxyCODONE (ROXICODONE) 5 MG immediate release tablet Take 1 tablet (5 mg total) by mouth every 4 (four) hours as needed for up to 7 days for severe pain or moderate pain. 08/04/21 08/11/21 Yes Marchwiany, Virgina Norfolk, MD  ?polyethylene glycol (MIRALAX / GLYCOLAX) 17 g packet Take 17  g by mouth daily. ?Patient taking differently: Take 17 g by mouth daily as needed for mild constipation. 05/12/21  Yes Shelly Coss, MD  ?potassium chloride (K-DUR) 10 MEQ tablet Take 10 mEq by mouth da

## 2021-08-07 NOTE — Discharge Summary (Signed)
Physician Discharge Summary  ?Tara Bradley NGE:[id number] DOB: Jun 27, 1922 DOA: 08/02/2021 ? ?PCP: [name], MD ? ?Admit date: 08/02/2021 ?Discharge date: 08/07/2021 ? ?Admitted From: Home ?Disposition:  SNF ? ?Discharge Condition:Stable ?CODE STATUS:DNR ?Diet recommendation: Heart Healthy  ? ?Brief/Interim Summary: ? ?Patient is a 88 year old female with history of diabetes type 2, hypertension, hyperlipidemia, chronic diastolic CHF, A-fib on Xarelto who presented with a fall from home.  Patient lives alone and ambulatory at baseline.  She was recently admitted here for CHF exacerbation/viral pneumonia.  Imagings on presentation showed left intertrochanteric hip fracture.  Orthopedics consulted and she underwent intramedullary nailing on 3/12.  Postoperatively, she became hypotensive and anemic secondary to acute blood loss anemia.  Hospital course also remarkable for hyponatremia, urinary retention,afib with RVR.  Her heart rate remained uncontrolled despite putting her on maximal oral medications.  Needed to call cardiology consultation today. Medications readjusted. Cardiology cleared for discharging her to SNF today. She will follow up with Orthopedics and Cardiology as an outpatient . ? ?Following problems were addressed during her hospitalization: ? ?Hip fracture ([place]) ?-Apparently mechanical fall resulting in hip fracture ?-S/P intramedullary nailing ?-Pain management, supportive care ?-PT/OT recommending skilled nursing facility discharge ?-TOC team consulted for rehab placement ?  ?Acute postoperative anemia due to expected blood loss ?Hemoglobin dropped from 12 to the range of 7. S/P transfusion with a unit  of PRBC during this hospitalization.  Currently stable in the range of 8 ?  ?Persistent atrial fibrillation ([place]) ?-Patient with chronic afib , frequently going into RVR ?-Continue home Cardizem and Toprol XL.Added dizoxin ?-On Xarelto for anticoagulation ?-Cardiology will arrange outpatient  follow up ?  ?Hyponatremia ?Sodium dropped to 123,now in 130s.She looked dehydrated. Urine sodium less than 10.Fluids stopped,resume home lasix ?  ?Respiratory insufficiency ?Currently requiring 2 L of oxygen per minute.  On auscultation there are no wheezes or crackles.  Chest x-ray showed possible atelectasis.  Continue incentive spirometry.Wean the oxygen as appropriate ?  ?Hyperkalemia ?Given Lokelma.  repeat K level normal.Check BMP in 3 days ?  ?Acute urinary retention ?Started on tamsulosin.Improved ?  ?DNR (do not resuscitate) ?Code status is DNR ?  ?Chronic combined systolic and diastolic heart failure ([place]) ?-recent echo with preserved EF, grade 2 diastolic dysfunction ?-She was dehydrated and was given IV fluids ?  ?Hyperlipidemia ?-last LDL was 70  ?-She does not appear to be taking medications for this issue at this time ?  ?Essential hypertension ?-Currently on Cardizem and metoprolol.  Continue monitoring ?  ?DM type 2, controlled, with complication ([place]) ?-recent A1c was 8.6 ?-Take metformin at home and also insulin ?-Resume ?  ?  ?  ?  ? ? ?Discharge Diagnoses:  ?Principal Problem: ?  Hip fracture ([place]) ?Active Problems: ?  Acute postoperative anemia due to expected blood loss ?  Persistent atrial fibrillation ([place]) ?  Hyponatremia ?  Respiratory insufficiency ?  DM type 2, controlled, with complication ([place]) ?  Essential hypertension ?  Hyperlipidemia ?  Chronic combined systolic and diastolic heart failure ([place]) ?  DNR (do not resuscitate) ?  Acute urinary retention ?  Hyperkalemia ? ? ? ?Discharge Instructions ? ?Discharge Instructions   ? ? Diet - low sodium heart healthy   Complete by: As directed ?  ? Discharge instructions   Complete by: As directed ?  ? 1)Please take the medications as instructed ?2)Follow up with Orthopedics in 2 weeks ?3)Follow up with cardiology in 2 weeks.You will be called for appointment ?4)Do  a BMP test in  3 days  ? Increase activity slowly   Complete by: As  directed ?  ? No wound care   Complete by: As directed ?  ? ?  ? ?Allergies as of 08/07/2021   ? ?   Reactions  ? Ambien [zolpidem Tartrate] Other (See Comments)  ? Hallucinations  ? Ambien [zolpidem] Other (See Comments)  ? hallucinations  ? Codeine Other (See Comments)  ? headache  ? Codeine Sulfate Other (See Comments)  ? REACTION: unspecified  ? Dulaglutide Diarrhea  ? Latex Hives  ? Morphine Sulfate Nausea And Vomiting  ? Mirtazapine Palpitations  ? ?  ? ?  ?Medication List  ?  ? ?TAKE these medications   ? ?acetaminophen 500 MG tablet ?Commonly known as: TYLENOL ?Take 2 tablets (1,000 mg total) by mouth every 8 (eight) hours as needed. ?  ?BD Pen Needle Nano U/F 32G X 4 MM Misc ?Generic drug: Insulin Pen Needle ?Inject into the skin daily. ?  ?Calcium Carbonate-Vitamin D 600-400 MG-UNIT tablet ?Take 1 tablet by mouth daily. ?  ?Digoxin 62.5 MCG Tabs ?Take 0.0625 mg by mouth daily. ?  ?diltiazem 360 MG 24 hr capsule ?Commonly known as: CARDIZEM CD ?Take 1 capsule (360 mg total) by mouth daily. ?  ?furosemide 40 MG tablet ?Commonly known as: LASIX ?TAKE ONE TABLET BY MOUTH DAILY ?  ?HumaLOG Mix 50/50 KwikPen (50-50) 100 UNIT/ML Kwikpen ?Generic drug: Insulin Lispro Prot & Lispro ?Inject 4-5 Units into the skin in the morning and at bedtime. Take 5 units in the morning after breakfast and 4 units at night after supper. ?  ?Magnesium Oxide 250 MG Tabs ?Take 250 mg by mouth daily. ?  ?metFORMIN 500 MG (MOD) 24 hr tablet ?Commonly known as: [place] ?Take 1,000 mg by mouth every evening. ?  ?methocarbamol 500 MG tablet ?Commonly known as: Robaxin ?Take 1 tablet (500 mg total) by mouth every 8 (eight) hours as needed for up to 10 days for muscle spasms. ?  ?metoprolol succinate 100 MG 24 hr tablet ?Commonly known as: TOPROL-XL ?TAKE ONE TABLET BY MOUTH TWICE DAILY. TAKE WITH OR IMMEDIATELY FOLLOWING A MEAL ?What changed: See the new instructions. ?  ?multivitamin-lutein Caps capsule ?Take 1 capsule by mouth daily. ?   ?oxyCODONE 5 MG immediate release tablet ?Commonly known as: Roxicodone ?Take 1 tablet (5 mg total) by mouth every 4 (four) hours as needed for up to 7 days for severe pain or moderate pain. ?  ?polyethylene glycol 17 g packet ?Commonly known as: MIRALAX / GLYCOLAX ?Take 17 g by mouth daily. ?What changed:  ?when to take this ?reasons to take this ?  ?potassium chloride 10 MEQ tablet ?Commonly known as: KLOR-CON ?Take 10 mEq by mouth daily. ?  ?senna 8.6 MG Tabs tablet ?Commonly known as: SENOKOT ?Take 1 tablet (8.6 mg total) by mouth 2 (two) times daily. ?  ?tamsulosin 0.4 MG Caps capsule ?Commonly known as: FLOMAX ?Take 1 capsule (0.4 mg total) by mouth daily. ?Start taking on: August 08, 2021 ?  ?Vitamin D 50 MCG (2000 UT) tablet ?Take 1,000 Units by mouth daily. ?  ?Xarelto 15 MG Tabs tablet ?Generic drug: Rivaroxaban ?TAKE 1 TABLET ONCE DAILY WITH SUPPER. ?What changed: See the new instructions. ?  ? ?  ? ? Contact information for follow-up providers   ? ? Willaim Sheng, MD Follow up in 2 week(s).   ?Specialty: Orthopedic Surgery ?Contact information: ?[address] ?[place]  [address] ?[phone] ? ? ?  ?  ? ?  ?  ? ? Contact information for after-discharge care   ? ? Destination   ? ? [facility] .   ?Service: Skilled Nursing ?Contact information: ?[place] ?[place] ?[phone] ? ?  ?  ? ?  ?  ? ?  ?  ? ?  ? ?Allergies  ?Allergen Reactions  ? Ambien [Zolpidem Tartrate] Other (See Comments)  ?  Hallucinations ?  ? Ambien [Zolpidem] Other (See Comments)  ?  hallucinations  ? Codeine Other (See Comments)  ?  headache ?  ? Codeine Sulfate Other (See Comments)  ?  REACTION: unspecified  ? Dulaglutide Diarrhea  ? Latex Hives  ? Morphine Sulfate Nausea And Vomiting  ? Mirtazapine Palpitations  ? ? ?Consultations: ?Cardiology,Ortho ? ? ?Procedures/Studies: ?DG Chest 2 View ? ?Result Date: 08/02/2021 ?CLINICAL DATA:  Acute fall with hip pain and  chest discomfort. Initial encounter. EXAM: CHEST - 2 VIEW COMPARISON:  06/22/2021 and studies FINDINGS: Cardiomegaly and mild chronic interstitial opacities are again noted. There is no evidence of focal airspace d

## 2021-08-08 DIAGNOSIS — D638 Anemia in other chronic diseases classified elsewhere: Secondary | ICD-10-CM | POA: Diagnosis not present

## 2021-08-08 DIAGNOSIS — Z853 Personal history of malignant neoplasm of breast: Secondary | ICD-10-CM | POA: Diagnosis not present

## 2021-08-08 DIAGNOSIS — E875 Hyperkalemia: Secondary | ICD-10-CM | POA: Diagnosis not present

## 2021-08-08 DIAGNOSIS — R6889 Other general symptoms and signs: Secondary | ICD-10-CM | POA: Diagnosis not present

## 2021-08-08 DIAGNOSIS — R944 Abnormal results of kidney function studies: Secondary | ICD-10-CM | POA: Diagnosis not present

## 2021-08-08 DIAGNOSIS — I7123 Aneurysm of the descending thoracic aorta, without rupture: Secondary | ICD-10-CM | POA: Diagnosis not present

## 2021-08-08 DIAGNOSIS — M25511 Pain in right shoulder: Secondary | ICD-10-CM | POA: Diagnosis not present

## 2021-08-08 DIAGNOSIS — R079 Chest pain, unspecified: Secondary | ICD-10-CM | POA: Diagnosis not present

## 2021-08-08 DIAGNOSIS — I472 Ventricular tachycardia, unspecified: Secondary | ICD-10-CM | POA: Diagnosis not present

## 2021-08-08 DIAGNOSIS — D649 Anemia, unspecified: Secondary | ICD-10-CM | POA: Diagnosis not present

## 2021-08-08 DIAGNOSIS — I5032 Chronic diastolic (congestive) heart failure: Secondary | ICD-10-CM | POA: Diagnosis not present

## 2021-08-08 DIAGNOSIS — I7121 Aneurysm of the ascending aorta, without rupture: Secondary | ICD-10-CM | POA: Diagnosis not present

## 2021-08-08 DIAGNOSIS — Z833 Family history of diabetes mellitus: Secondary | ICD-10-CM | POA: Diagnosis not present

## 2021-08-08 DIAGNOSIS — J811 Chronic pulmonary edema: Secondary | ICD-10-CM | POA: Diagnosis not present

## 2021-08-08 DIAGNOSIS — J9601 Acute respiratory failure with hypoxia: Secondary | ICD-10-CM | POA: Diagnosis not present

## 2021-08-08 DIAGNOSIS — Z794 Long term (current) use of insulin: Secondary | ICD-10-CM | POA: Diagnosis not present

## 2021-08-08 DIAGNOSIS — I712 Thoracic aortic aneurysm, without rupture, unspecified: Secondary | ICD-10-CM | POA: Diagnosis not present

## 2021-08-08 DIAGNOSIS — G479 Sleep disorder, unspecified: Secondary | ICD-10-CM | POA: Diagnosis not present

## 2021-08-08 DIAGNOSIS — E871 Hypo-osmolality and hyponatremia: Secondary | ICD-10-CM | POA: Diagnosis not present

## 2021-08-08 DIAGNOSIS — Z7401 Bed confinement status: Secondary | ICD-10-CM | POA: Diagnosis not present

## 2021-08-08 DIAGNOSIS — R5383 Other fatigue: Secondary | ICD-10-CM | POA: Diagnosis not present

## 2021-08-08 DIAGNOSIS — Z807 Family history of other malignant neoplasms of lymphoid, hematopoietic and related tissues: Secondary | ICD-10-CM | POA: Diagnosis not present

## 2021-08-08 DIAGNOSIS — E119 Type 2 diabetes mellitus without complications: Secondary | ICD-10-CM | POA: Diagnosis not present

## 2021-08-08 DIAGNOSIS — M5430 Sciatica, unspecified side: Secondary | ICD-10-CM | POA: Diagnosis not present

## 2021-08-08 DIAGNOSIS — R339 Retention of urine, unspecified: Secondary | ICD-10-CM | POA: Diagnosis not present

## 2021-08-08 DIAGNOSIS — I10 Essential (primary) hypertension: Secondary | ICD-10-CM | POA: Diagnosis not present

## 2021-08-08 DIAGNOSIS — I471 Supraventricular tachycardia: Secondary | ICD-10-CM | POA: Diagnosis not present

## 2021-08-08 DIAGNOSIS — I110 Hypertensive heart disease with heart failure: Secondary | ICD-10-CM | POA: Diagnosis not present

## 2021-08-08 DIAGNOSIS — Z20822 Contact with and (suspected) exposure to covid-19: Secondary | ICD-10-CM | POA: Diagnosis not present

## 2021-08-08 DIAGNOSIS — M810 Age-related osteoporosis without current pathological fracture: Secondary | ICD-10-CM | POA: Diagnosis not present

## 2021-08-08 DIAGNOSIS — R498 Other voice and resonance disorders: Secondary | ICD-10-CM | POA: Diagnosis not present

## 2021-08-08 DIAGNOSIS — D62 Acute posthemorrhagic anemia: Secondary | ICD-10-CM | POA: Diagnosis not present

## 2021-08-08 DIAGNOSIS — I499 Cardiac arrhythmia, unspecified: Secondary | ICD-10-CM | POA: Diagnosis not present

## 2021-08-08 DIAGNOSIS — J9811 Atelectasis: Secondary | ICD-10-CM | POA: Diagnosis not present

## 2021-08-08 DIAGNOSIS — I517 Cardiomegaly: Secondary | ICD-10-CM | POA: Diagnosis not present

## 2021-08-08 DIAGNOSIS — R531 Weakness: Secondary | ICD-10-CM | POA: Diagnosis not present

## 2021-08-08 DIAGNOSIS — M7989 Other specified soft tissue disorders: Secondary | ICD-10-CM | POA: Diagnosis not present

## 2021-08-08 DIAGNOSIS — M6281 Muscle weakness (generalized): Secondary | ICD-10-CM | POA: Diagnosis not present

## 2021-08-08 DIAGNOSIS — R262 Difficulty in walking, not elsewhere classified: Secondary | ICD-10-CM | POA: Diagnosis not present

## 2021-08-08 DIAGNOSIS — R338 Other retention of urine: Secondary | ICD-10-CM | POA: Diagnosis not present

## 2021-08-08 DIAGNOSIS — R0602 Shortness of breath: Secondary | ICD-10-CM | POA: Diagnosis not present

## 2021-08-08 DIAGNOSIS — S72142D Displaced intertrochanteric fracture of left femur, subsequent encounter for closed fracture with routine healing: Secondary | ICD-10-CM | POA: Diagnosis not present

## 2021-08-08 DIAGNOSIS — Z79899 Other long term (current) drug therapy: Secondary | ICD-10-CM | POA: Diagnosis not present

## 2021-08-08 DIAGNOSIS — M6259 Muscle wasting and atrophy, not elsewhere classified, multiple sites: Secondary | ICD-10-CM | POA: Diagnosis not present

## 2021-08-08 DIAGNOSIS — Z9189 Other specified personal risk factors, not elsewhere classified: Secondary | ICD-10-CM | POA: Diagnosis not present

## 2021-08-08 DIAGNOSIS — I509 Heart failure, unspecified: Secondary | ICD-10-CM | POA: Diagnosis not present

## 2021-08-08 DIAGNOSIS — R2689 Other abnormalities of gait and mobility: Secondary | ICD-10-CM | POA: Diagnosis not present

## 2021-08-08 DIAGNOSIS — R2681 Unsteadiness on feet: Secondary | ICD-10-CM | POA: Diagnosis not present

## 2021-08-08 DIAGNOSIS — Z7984 Long term (current) use of oral hypoglycemic drugs: Secondary | ICD-10-CM | POA: Diagnosis not present

## 2021-08-08 DIAGNOSIS — I4891 Unspecified atrial fibrillation: Secondary | ICD-10-CM | POA: Diagnosis not present

## 2021-08-08 DIAGNOSIS — I119 Hypertensive heart disease without heart failure: Secondary | ICD-10-CM | POA: Diagnosis not present

## 2021-08-08 DIAGNOSIS — Z8249 Family history of ischemic heart disease and other diseases of the circulatory system: Secondary | ICD-10-CM | POA: Diagnosis not present

## 2021-08-08 DIAGNOSIS — E1165 Type 2 diabetes mellitus with hyperglycemia: Secondary | ICD-10-CM | POA: Diagnosis not present

## 2021-08-08 DIAGNOSIS — I71012 Dissection of descending thoracic aorta: Secondary | ICD-10-CM | POA: Diagnosis not present

## 2021-08-08 DIAGNOSIS — R0689 Other abnormalities of breathing: Secondary | ICD-10-CM | POA: Diagnosis not present

## 2021-08-08 DIAGNOSIS — R609 Edema, unspecified: Secondary | ICD-10-CM | POA: Diagnosis not present

## 2021-08-08 DIAGNOSIS — Z7901 Long term (current) use of anticoagulants: Secondary | ICD-10-CM | POA: Diagnosis not present

## 2021-08-08 DIAGNOSIS — E785 Hyperlipidemia, unspecified: Secondary | ICD-10-CM | POA: Diagnosis not present

## 2021-08-08 DIAGNOSIS — I5030 Unspecified diastolic (congestive) heart failure: Secondary | ICD-10-CM | POA: Diagnosis not present

## 2021-08-08 DIAGNOSIS — Z743 Need for continuous supervision: Secondary | ICD-10-CM | POA: Diagnosis not present

## 2021-08-08 DIAGNOSIS — I5043 Acute on chronic combined systolic (congestive) and diastolic (congestive) heart failure: Secondary | ICD-10-CM | POA: Diagnosis not present

## 2021-08-08 DIAGNOSIS — I482 Chronic atrial fibrillation, unspecified: Secondary | ICD-10-CM | POA: Diagnosis not present

## 2021-08-08 DIAGNOSIS — R601 Generalized edema: Secondary | ICD-10-CM | POA: Diagnosis not present

## 2021-08-08 DIAGNOSIS — Z66 Do not resuscitate: Secondary | ICD-10-CM | POA: Diagnosis not present

## 2021-08-08 DIAGNOSIS — I5033 Acute on chronic diastolic (congestive) heart failure: Secondary | ICD-10-CM | POA: Diagnosis not present

## 2021-08-08 DIAGNOSIS — Z91040 Latex allergy status: Secondary | ICD-10-CM | POA: Diagnosis not present

## 2021-08-08 DIAGNOSIS — J90 Pleural effusion, not elsewhere classified: Secondary | ICD-10-CM | POA: Diagnosis not present

## 2021-08-08 DIAGNOSIS — S72002G Fracture of unspecified part of neck of left femur, subsequent encounter for closed fracture with delayed healing: Secondary | ICD-10-CM

## 2021-08-08 DIAGNOSIS — I358 Other nonrheumatic aortic valve disorders: Secondary | ICD-10-CM | POA: Diagnosis not present

## 2021-08-08 DIAGNOSIS — S72002A Fracture of unspecified part of neck of left femur, initial encounter for closed fracture: Secondary | ICD-10-CM | POA: Diagnosis not present

## 2021-08-08 DIAGNOSIS — E118 Type 2 diabetes mellitus with unspecified complications: Secondary | ICD-10-CM | POA: Diagnosis not present

## 2021-08-08 DIAGNOSIS — E873 Alkalosis: Secondary | ICD-10-CM | POA: Diagnosis not present

## 2021-08-08 DIAGNOSIS — E1142 Type 2 diabetes mellitus with diabetic polyneuropathy: Secondary | ICD-10-CM | POA: Diagnosis not present

## 2021-08-08 DIAGNOSIS — R600 Localized edema: Secondary | ICD-10-CM | POA: Diagnosis not present

## 2021-08-08 LAB — BASIC METABOLIC PANEL
Anion gap: 5 (ref 5–15)
BUN: 19 mg/dL (ref 8–23)
CO2: 29 mmol/L (ref 22–32)
Calcium: 8.3 mg/dL — ABNORMAL LOW (ref 8.9–10.3)
Chloride: 99 mmol/L (ref 98–111)
Creatinine, Ser: 0.71 mg/dL (ref 0.44–1.00)
GFR, Estimated: >60 mL/min (ref >60)
Glucose, Bld: 139 mg/dL — ABNORMAL HIGH (ref 70–99)
Potassium: 5 mmol/L (ref 3.5–5.1)
Sodium: 133 mmol/L — ABNORMAL LOW (ref 135–145)

## 2021-08-08 LAB — GLUCOSE, CAPILLARY: Glucose-Capillary: 126 mg/dL — ABNORMAL HIGH (ref 70–99)

## 2021-08-08 NOTE — Plan of Care (Signed)
?  Problem: Education: ?Goal: Verbalization of understanding the information provided (i.e., activity precautions, restrictions, etc) will improve ?Outcome: Adequate for Discharge ?Goal: Individualized Educational Video(s) ?Outcome: Adequate for Discharge ?  ?Problem: Activity: ?Goal: Ability to ambulate and perform ADLs will improve ?Outcome: Adequate for Discharge ?  ?Problem: Clinical Measurements: ?Goal: Postoperative complications will be avoided or minimized ?Outcome: Adequate for Discharge ?  ?Problem: Self-Concept: ?Goal: Ability to maintain and perform role responsibilities to the fullest extent possible will improve ?Outcome: Adequate for Discharge ?  ?Problem: Pain Management: ?Goal: Pain level will decrease ?Outcome: Adequate for Discharge ?  ?Problem: Education: ?Goal: Knowledge of General Education information will improve ?Description: Including pain rating scale, medication(s)/side effects and non-pharmacologic comfort measures ?Outcome: Adequate for Discharge ?  ?Problem: Health Behavior/Discharge Planning: ?Goal: Ability to manage health-related needs will improve ?Outcome: Adequate for Discharge ?  ?Problem: Clinical Measurements: ?Goal: Ability to maintain clinical measurements within normal limits will improve ?Outcome: Adequate for Discharge ?Goal: Will remain free from infection ?Outcome: Adequate for Discharge ?Goal: Diagnostic test results will improve ?Outcome: Adequate for Discharge ?Goal: Respiratory complications will improve ?Outcome: Adequate for Discharge ?Goal: Cardiovascular complication will be avoided ?Outcome: Adequate for Discharge ?  ?Problem: Activity: ?Goal: Risk for activity intolerance will decrease ?Outcome: Adequate for Discharge ?  ?Problem: Nutrition: ?Goal: Adequate nutrition will be maintained ?Outcome: Adequate for Discharge ?  ?Problem: Coping: ?Goal: Level of anxiety will decrease ?Outcome: Adequate for Discharge ?  ?Problem: Elimination: ?Goal: Will not experience  complications related to bowel motility ?Outcome: Adequate for Discharge ?Goal: Will not experience complications related to urinary retention ?Outcome: Adequate for Discharge ?  ?Problem: Pain Managment: ?Goal: General experience of comfort will improve ?Outcome: Adequate for Discharge ?  ?Problem: Safety: ?Goal: Ability to remain free from injury will improve ?Outcome: Adequate for Discharge ?  ?Problem: Skin Integrity: ?Goal: Risk for impaired skin integrity will decrease ?Outcome: Adequate for Discharge ?  ?

## 2021-08-08 NOTE — Discharge Summary (Signed)
Physician Discharge Summary  ?Tara Bradley TFT:[id number] DOB: 1922/06/24 DOA: 08/02/2021 ? ?PCP: [name], MD ? ?Admit date: 08/02/2021 ?Discharge date: 08/08/2021 ? ?Admitted From: Home ?Disposition:  SNF ? ?Discharge Condition:Stable ?CODE STATUS:DNR ?Diet recommendation: Heart Healthy  ? ?Brief/Interim Summary: ? ?Patient is a 88 year old female with history of diabetes type 2, hypertension, hyperlipidemia, chronic diastolic CHF, A-fib on Xarelto who presented with a fall from home.  Patient lives alone and ambulatory at baseline.  She was recently admitted here for CHF exacerbation/viral pneumonia.  Imagings on presentation showed left intertrochanteric hip fracture.  Orthopedics consulted and she underwent intramedullary nailing on 3/12.  Postoperatively, she became hypotensive and anemic secondary to acute blood loss anemia.  Hospital course also remarkable for hyponatremia, urinary retention,afib with RVR.  Her heart rate remained uncontrolled despite putting her on maximal oral medications.  Needed to call cardiology consultation today. Medications readjusted. Cardiology cleared for discharging her to SNF today. She will follow up with Orthopedics and Cardiology as an outpatient . ? ?Following problems were addressed during her hospitalization: ? ?Hip fracture ([place]) ?-Apparently mechanical fall resulting in hip fracture ?-S/P intramedullary nailing ?-Pain management, supportive care ?-PT/OT recommending skilled nursing facility discharge ?-TOC team consulted for rehab placement ?  ?Acute postoperative anemia due to expected blood loss ?Hemoglobin dropped from 12 to the range of 7. S/P transfusion with a unit  of PRBC during this hospitalization.  Currently stable in the range of 8 ?  ?Persistent atrial fibrillation ([name]) ?-Patient with chronic afib , frequently going into RVR ?-Continue home Cardizem and Toprol XL.Added dizoxin ?-On Xarelto for anticoagulation ?-Cardiology will arrange outpatient  follow up ?  ?Hyponatremia ?Sodium dropped to 123,now in 130s.She looked dehydrated. Urine sodium less than 10.Fluids stopped,resume home lasix ?  ?Respiratory insufficiency ?Currently requiring 2 L of oxygen per minute.  On auscultation there are no wheezes or crackles.  Chest x-ray showed possible atelectasis.  Continue incentive spirometry.Wean the oxygen as appropriate ?  ?Hyperkalemia ?Given Lokelma.  repeat K level normal.Check BMP in 3 days ?  ?Acute urinary retention ?Started on tamsulosin.Improved ?  ?DNR (do not resuscitate) ?Code status is DNR ?  ?Chronic combined systolic and diastolic heart failure ([place]) ?-recent echo with preserved EF, grade 2 diastolic dysfunction ?-She was dehydrated and was given IV fluids ?  ?Hyperlipidemia ?-last LDL was 70  ?-She does not appear to be taking medications for this issue at this time ?  ?Essential hypertension ?-Currently on Cardizem and metoprolol.  Continue monitoring ?  ?DM type 2, controlled, with complication ([name]) ?-recent A1c was 8.6 ?-Take metformin at home and also insulin ?-Resume ?  ?  ?  ?  ? ? ?Discharge Diagnoses:  ?Principal Problem: ?  Hip fracture ([place]) ?Active Problems: ?  Acute postoperative anemia due to expected blood loss ?  Persistent atrial fibrillation ([name]) ?  Hyponatremia ?  Respiratory insufficiency ?  DM type 2, controlled, with complication ([name]) ?  Essential hypertension ?  Hyperlipidemia ?  Chronic combined systolic and diastolic heart failure ([name]) ?  DNR (do not resuscitate) ?  Acute urinary retention ?  Hyperkalemia ? ? ? ?Discharge Instructions ? ?Discharge Instructions   ? ? Diet - low sodium heart healthy   Complete by: As directed ?  ? Discharge instructions   Complete by: As directed ?  ? 1)Please take the medications as instructed ?2)Follow up with Orthopedics in 2 weeks ?3)Follow up with cardiology in 2 weeks.You will be called for appointment ?4)Do  a BMP test in  3 days  ? Increase activity slowly   Complete by: As  directed ?  ? No wound care   Complete by: As directed ?  ? ?  ? ?Allergies as of 08/08/2021   ? ?   Reactions  ? Ambien [zolpidem Tartrate] Other (See Comments)  ? Hallucinations  ? Ambien [zolpidem] Other (See Comments)  ? hallucinations  ? Codeine Other (See Comments)  ? headache  ? Codeine Sulfate Other (See Comments)  ? REACTION: unspecified  ? Dulaglutide Diarrhea  ? Latex Hives  ? Morphine Sulfate Nausea And Vomiting  ? Mirtazapine Palpitations  ? ?  ? ?  ?Medication List  ?  ? ?TAKE these medications   ? ?acetaminophen 500 MG tablet ?Commonly known as: TYLENOL ?Take 2 tablets (1,000 mg total) by mouth every 8 (eight) hours as needed. ?  ?BD Pen Needle Nano U/F 32G X 4 MM Misc ?Generic drug: Insulin Pen Needle ?Inject into the skin daily. ?  ?Calcium Carbonate-Vitamin D 600-400 MG-UNIT tablet ?Take 1 tablet by mouth daily. ?  ?Digoxin 62.5 MCG Tabs ?Take 0.0625 mg by mouth daily. ?  ?diltiazem 360 MG 24 hr capsule ?Commonly known as: CARDIZEM CD ?Take 1 capsule (360 mg total) by mouth daily. ?  ?furosemide 40 MG tablet ?Commonly known as: LASIX ?TAKE ONE TABLET BY MOUTH DAILY ?  ?HumaLOG Mix 50/50 KwikPen (50-50) 100 UNIT/ML Kwikpen ?Generic drug: Insulin Lispro Prot & Lispro ?Inject 4-5 Units into the skin in the morning and at bedtime. Take 5 units in the morning after breakfast and 4 units at night after supper. ?  ?Magnesium Oxide 250 MG Tabs ?Take 250 mg by mouth daily. ?  ?metFORMIN 500 MG (MOD) 24 hr tablet ?Commonly known as: [place] ?Take 1,000 mg by mouth every evening. ?  ?methocarbamol 500 MG tablet ?Commonly known as: Robaxin ?Take 1 tablet (500 mg total) by mouth every 8 (eight) hours as needed for up to 10 days for muscle spasms. ?  ?metoprolol succinate 100 MG 24 hr tablet ?Commonly known as: TOPROL-XL ?TAKE ONE TABLET BY MOUTH TWICE DAILY. TAKE WITH OR IMMEDIATELY FOLLOWING A MEAL ?What changed: See the new instructions. ?  ?multivitamin-lutein Caps capsule ?Take 1 capsule by mouth daily. ?   ?oxyCODONE 5 MG immediate release tablet ?Commonly known as: Roxicodone ?Take 1 tablet (5 mg total) by mouth every 4 (four) hours as needed for up to 7 days for severe pain or moderate pain. ?  ?polyethylene glycol 17 g packet ?Commonly known as: MIRALAX / GLYCOLAX ?Take 17 g by mouth daily. ?What changed:  ?when to take this ?reasons to take this ?  ?potassium chloride 10 MEQ tablet ?Commonly known as: KLOR-CON ?Take 10 mEq by mouth daily. ?  ?senna 8.6 MG Tabs tablet ?Commonly known as: SENOKOT ?Take 1 tablet (8.6 mg total) by mouth 2 (two) times daily. ?  ?tamsulosin 0.4 MG Caps capsule ?Commonly known as: FLOMAX ?Take 1 capsule (0.4 mg total) by mouth daily. ?  ?Vitamin D 50 MCG (2000 UT) tablet ?Take 1,000 Units by mouth daily. ?  ?Xarelto 15 MG Tabs tablet ?Generic drug: Rivaroxaban ?TAKE 1 TABLET ONCE DAILY WITH SUPPER. ?What changed: See the new instructions. ?  ? ?  ? ? Contact information for follow-up providers   ? ? Willaim Sheng, MD Follow up in 2 week(s).   ?Specialty: Orthopedic Surgery ?Contact information: ?[address] ?[place] [address] ?[phone] ? ? ?  ?  ? ?  ?  ? ?  Contact information for after-discharge care   ? ? Destination   ? ? [facility] .   ?Service: Skilled Nursing ?Contact information: ?[place] ?[place] ?[phone] ? ?  ?  ? ?  ?  ? ?  ?  ? ?  ? ?Allergies  ?Allergen Reactions  ? Ambien [Zolpidem Tartrate] Other (See Comments)  ?  Hallucinations ?  ? Ambien [Zolpidem] Other (See Comments)  ?  hallucinations  ? Codeine Other (See Comments)  ?  headache ?  ? Codeine Sulfate Other (See Comments)  ?  REACTION: unspecified  ? Dulaglutide Diarrhea  ? Latex Hives  ? Morphine Sulfate Nausea And Vomiting  ? Mirtazapine Palpitations  ? ? ?Consultations: ?Cardiology,Ortho ? ? ?Procedures/Studies: ?DG Chest 2 View ? ?Result Date: 08/02/2021 ?CLINICAL DATA:  Acute fall with hip pain and chest discomfort. Initial  encounter. EXAM: CHEST - 2 VIEW COMPARISON:  06/22/2021 and studies FINDINGS: Cardiomegaly and mild chronic interstitial opacities are again noted. There is no evidence of focal airspace disease, pulmonary edema, suspicio

## 2021-08-08 NOTE — TOC Transition Note (Signed)
Transition of Care (TOC) - CM/SW Discharge Note ? ? ?Patient Details  ?Name: Tara Bradley ?MRN: [id number] ?Date of Birth: July 07, 1922 ? ?Transition of [facility] CM/SW Contact:  ?[name], LCSW ?Phone Number: ?08/08/2021, 10:06 AM ? ? ?Clinical Narrative:   Pt discharging to [facility], room 701P.  RN call report to [phone]. ? ? ? ?Final next level of care: [place] ?Barriers to Discharge: Barriers Resolved ? ? ?Patient Goals and CMS Choice ?Patient states their goals for this hospitalization and ongoing recovery are:: "back to normal" ?CMS Medicare.gov Compare Post Acute Care list provided to:: Patient ?Choice offered to / list presented to : Patient ? ?Discharge Placement ?  ?           ?Patient chooses bed at: [facility] ?Patient to be transferred to facility by: [facility] ?Name of family member notified: nephew Rush Landmark ?Patient and family notified of of transfer: 08/08/21 ? ?Discharge Plan and Services ?In-house Referral: Clinical Social Work ?  ?Post Acute Care Choice: [place]          ?  ?  ?  ?  ?  ?  ?  ?  ?  ?  ? ?Social Determinants of Health (SDOH) Interventions ?  ? ? ?Readmission Risk Interventions ?Readmission Risk Prevention Plan 05/11/2021  ?Transportation Screening Complete  ?Home Care Screening Complete  ?Some recent data might be hidden  ? ? ? ? ? ?

## 2021-08-08 NOTE — Progress Notes (Signed)
?Cardiology Office Note:   ? ?Date:  08/18/2021  ? ?ID:  Tara Bradley, DOB [birth year], MRN [id number] ? ?PCP:  [name], MD  ?Cardiologist:  [name], MD  ?Electrophysiologist:  None  ? ?Referring MD: [name], MD  ? ?Chief Complaint: hospital follow-up for atrial fibrillation with RVR ? ?History of Present Illness:   ? ?Tara Bradley is a 88 y.o. female with a history of chronic atrial fibrillation Xarelto, chronic diastolic CHF, aortic aneurysm with chronic descending dissection, aortic valve sclerosis, hypertension, hyperlipidemia, type 2 diabetes mellitus, and recent left hip fracture who is followed by Dr. Percival Spanish and presents today for hospital follow-up of atrial fibrillation with RVR. ? ?Patient has had a couple of hospitalizations over the last few months. She was admitted in 04/2021 with a right humerus fracture after a fall and underwent reverse shoulder arthroplasty. Hospitalization was complicated by acute on chronic diastolic CHF and uncontrolled atrial fibrillation. She was readmitted from 06/22/2021 to 06/26/2021 acute on chronic diastolic CHF and possible viral pneumonia. Echo showed LVEF 55-60% with normal wall motion and grade II diastolic dysfunction.  RV normal. Also showed mild MR, moderate to severe TR, and moderately elevated PASP of 52.1 mmHg. She was diuresed. Rates were again difficult to control during admission and Toprol-XL was increased (home Cardizem and Digoxin were continued). ? ?She was last seen by Dr. Percival Spanish on 07/17/2021 for hospital follow-up at which time she was doing relatively well and denied any new shortness of breath or any chest discomfort. Rates were controlled. ? ?She was recently admitted from 08/02/2021 to 08/08/2021 for a left hip fracture after a mechanical fall at home. She underwent intramedullary nailing on 3/12. Postoperatively, she became hypotensive and anemic secondary to acute blood loss anemia. Hemoglobin dropped from 12.3 to as low  as 7.1 and she required 1 unit of PRBCs. She also developed acute urinary retention requiring Foley placement. She has chronic atrial fibrillation and heart rates were uncontrolled for most of admission. When she became hypotensive, patient's home Cardizem and Toprol were reduced. Cardiology was ultimately consulted and patient was placed back on home dose of Cardizem and Toprol and was also restarted on Digoxin 0.0625 (this had fallen off her medication list so was not started on admission but patient confirmed she was still taking this at home).  ? ?Patient presents today for follow-up. Here alone.  Patient has had a slow recovery since being discharged.  She is currently at [facility]. She has very noticeable left lower extremity edema on exam.  She notes her right leg is swollen as well but left leg is much worse.  She states she had a lower extremity doppler performed at [facility] last week and it was negative for DVT.  She reports new dyspnea on exertion since leaving the hospital and is now on 1L of supplemental O2 via nasal cannula as needed (she has this on during visit today). She denies any shortness of breath at rest.  No orthopnea or PND.  No chest pain.  No palpitations.  She notes mild lightheadedness/dizziness when first standing up but this does not last for long.  No syncope. ? ?Past Medical History:  ?Diagnosis Date  ? Aortic valve sclerosis   ? Echo, 2008  ? Arthritis   ? Bradycardia   ? October, 2012  ? Cancer [facility])   ? Chronic insomnia   ? Closed fracture of unspecified part of femur 2005  ? Diabetes mellitus, type 2 ([place])   ?  Diverticulitis   ? Diverticulosis   ? Dyspnea   ? Hyperlipidemia   ? Hypertension   ? Long term (current) use of anticoagulants   ? Osteopenia   ? Osteoporosis   ? femur fracture 2005, pelvic fracture 2006  ? Persistent atrial fibrillation ([place])   ? Personal history of malignant neoplasm of breast   ? Pneumonia   ? Syncope   ? Thoracic aortic aneurysm 02/2017  ?  Unspecified closed fracture of pelvis 2006  ? ? ?Past Surgical History:  ?Procedure Laterality Date  ? CARDIOVERSION N/A 08/09/2012  ? Procedure: CARDIOVERSION;  Surgeon: Deboraha Sprang, MD;  Location: [place];  Service: Cardiovascular;  Laterality: N/A;  ? EYE SURGERY    ? FEMUR SURGERY  2005  ? ORIF  ? INTRAMEDULLARY (IM) NAIL INTERTROCHANTERIC Left 08/02/2021  ? Procedure: INTRAMEDULLARY (IM) NAIL INTERTROCHANTRIC;  Surgeon: Willaim Sheng, MD;  Location: [place];  Service: Orthopedics;  Laterality: Left;  ? IR ANGIOGRAM PELVIS SELECTIVE OR SUPRASELECTIVE  03/11/2017  ? IR ANGIOGRAM SELECTIVE EACH ADDITIONAL VESSEL  03/11/2017  ? IR EMBO ART  VEN HEMORR LYMPH EXTRAV  INC GUIDE ROADMAPPING  03/11/2017  ? IR FLUORO GUIDE CV LINE RIGHT  03/11/2017  ? IR US GUIDE VASC ACCESS RIGHT  03/11/2017  ? IR US GUIDE VASC ACCESS RIGHT  03/11/2017  ? MASTECTOMY  1995  ? left  ? MINOR HARDWARE REMOVAL Left 09/01/2020  ? Procedure: REMOVAL OF HARDWARE LEFT FOREARM;  Surgeon: Milly Jakob, MD;  Location: [place];  Service: Orthopedics;  Laterality: Left;  ? ORIF ULNAR FRACTURE Left 10/19/2019  ? Procedure: OPEN REDUCTION INTERNAL FIXATION (ORIF)  LEFT DISTAL RADIUS  AND ULNA FRACTURE;  Surgeon: Milly Jakob, MD;  Location: [place];  Service: Orthopedics;  Laterality: Left;  ? REVERSE SHOULDER ARTHROPLASTY Right 05/04/2021  ? Procedure: REVERSE SHOULDER ARTHROPLASTY;  Surgeon: Netta Cedars, MD;  Location: [place];  Service: Orthopedics;  Laterality: Right;  ? SHOULDER SURGERY    ? left  ? [place], 2003  ? TONSILLECTOMY    ? TOTAL ABDOMINAL HYSTERECTOMY W/ BILATERAL SALPINGOOPHORECTOMY  1995  ? WRIST SURGERY    ?  x 2   ? ? ?Current Medications: ?Current Meds  ?Medication Sig  ? acetaminophen (TYLENOL) 500 MG tablet Take 2 tablets (1,000 mg total) by mouth every 8 (eight) hours as needed.  ? BD PEN NEEDLE NANO U/F 32G X 4 MM MISC Inject into the skin daily.  ? Calcium Carbonate-Vitamin D  600-400 MG-UNIT tablet Take 1 tablet by mouth daily.  ? Cholecalciferol (VITAMIN D) 2000 UNITS tablet Take 1,000 Units by mouth daily.  ? digoxin 62.5 MCG TABS Take 0.0625 mg by mouth daily.  ? diltiazem (CARDIZEM CD) 360 MG 24 hr capsule Take 1 capsule (360 mg total) by mouth daily.  ? furosemide (LASIX) 40 MG tablet TAKE ONE TABLET BY MOUTH DAILY (Patient taking differently: Take 40 mg by mouth daily.)  ? gabapentin (NEURONTIN) 100 MG capsule Take 100 mg by mouth 3 (three) times daily.  ? Insulin Lispro Prot & Lispro (HUMALOG MIX 50/50 KWIKPEN) (50-50) 100 UNIT/ML Kwikpen Inject 4-5 Units into the skin in the morning and at bedtime. Take 5 units in the morning after breakfast and 4 units at night after supper.  ? Magnesium Oxide 250 MG TABS Take 250 mg by mouth daily.  ? metFORMIN (GLUMETZA) 500 MG (MOD) 24 hr tablet Take 1,000 mg by mouth every evening.  ? metoprolol  succinate (TOPROL-XL) 100 MG 24 hr tablet TAKE ONE TABLET BY MOUTH TWICE DAILY. TAKE WITH OR IMMEDIATELY FOLLOWING A MEAL (Patient taking differently: Take 100 mg by mouth in the morning and at bedtime.)  ? multivitamin-lutein (OCUVITE-LUTEIN) CAPS capsule Take 1 capsule by mouth daily.  ? polyethylene glycol (MIRALAX / GLYCOLAX) 17 g packet Take 17 g by mouth daily. (Patient taking differently: Take 17 g by mouth daily as needed for mild constipation.)  ? potassium chloride (K-DUR) 10 MEQ tablet Take 10 mEq by mouth daily.   ? senna (SENOKOT) 8.6 MG TABS tablet Take 1 tablet (8.6 mg total) by mouth 2 (two) times daily.  ? tamsulosin (FLOMAX) 0.4 MG CAPS capsule Take 1 capsule (0.4 mg total) by mouth daily.  ? XARELTO 15 MG TABS tablet TAKE 1 TABLET ONCE DAILY WITH SUPPER. (Patient taking differently: Take 15 mg by mouth daily with supper.)  ?  ? ?Allergies:   Ambien [zolpidem], Codeine, Codeine sulfate, Dulaglutide, Latex, Morphine sulfate, and Mirtazapine  ? ?Social History  ? ?Socioeconomic History  ? Marital status: Widowed  ?  Spouse name: Not  on file  ? Number of children: 2  ? Years of education: Not on file  ? Highest education level: Not on file  ?Occupational History  ? Occupation: English as a second language teacher, retired  ?Tobacco Use  ? Smoking status: Never  ?

## 2021-08-08 NOTE — Progress Notes (Signed)
Report called and given to [facility] at [facility].  ?

## 2021-08-10 DIAGNOSIS — R339 Retention of urine, unspecified: Secondary | ICD-10-CM | POA: Diagnosis not present

## 2021-08-10 DIAGNOSIS — I4891 Unspecified atrial fibrillation: Secondary | ICD-10-CM | POA: Diagnosis not present

## 2021-08-10 DIAGNOSIS — R2681 Unsteadiness on feet: Secondary | ICD-10-CM | POA: Diagnosis not present

## 2021-08-10 DIAGNOSIS — S72142D Displaced intertrochanteric fracture of left femur, subsequent encounter for closed fracture with routine healing: Secondary | ICD-10-CM | POA: Diagnosis not present

## 2021-08-10 DIAGNOSIS — J9601 Acute respiratory failure with hypoxia: Secondary | ICD-10-CM | POA: Diagnosis not present

## 2021-08-10 DIAGNOSIS — R601 Generalized edema: Secondary | ICD-10-CM | POA: Diagnosis not present

## 2021-08-11 ENCOUNTER — Other Ambulatory Visit: Payer: Self-pay | Admitting: *Deleted

## 2021-08-11 DIAGNOSIS — R0689 Other abnormalities of breathing: Secondary | ICD-10-CM | POA: Diagnosis not present

## 2021-08-11 DIAGNOSIS — S72002A Fracture of unspecified part of neck of left femur, initial encounter for closed fracture: Secondary | ICD-10-CM | POA: Diagnosis not present

## 2021-08-11 DIAGNOSIS — I10 Essential (primary) hypertension: Secondary | ICD-10-CM | POA: Diagnosis not present

## 2021-08-11 DIAGNOSIS — I5030 Unspecified diastolic (congestive) heart failure: Secondary | ICD-10-CM | POA: Diagnosis not present

## 2021-08-11 DIAGNOSIS — R338 Other retention of urine: Secondary | ICD-10-CM | POA: Diagnosis not present

## 2021-08-11 DIAGNOSIS — Z9189 Other specified personal risk factors, not elsewhere classified: Secondary | ICD-10-CM | POA: Diagnosis not present

## 2021-08-11 DIAGNOSIS — E871 Hypo-osmolality and hyponatremia: Secondary | ICD-10-CM | POA: Diagnosis not present

## 2021-08-11 DIAGNOSIS — E785 Hyperlipidemia, unspecified: Secondary | ICD-10-CM | POA: Diagnosis not present

## 2021-08-11 DIAGNOSIS — I4891 Unspecified atrial fibrillation: Secondary | ICD-10-CM | POA: Diagnosis not present

## 2021-08-11 DIAGNOSIS — D62 Acute posthemorrhagic anemia: Secondary | ICD-10-CM | POA: Diagnosis not present

## 2021-08-11 DIAGNOSIS — E118 Type 2 diabetes mellitus with unspecified complications: Secondary | ICD-10-CM | POA: Diagnosis not present

## 2021-08-11 DIAGNOSIS — I119 Hypertensive heart disease without heart failure: Secondary | ICD-10-CM | POA: Diagnosis not present

## 2021-08-11 DIAGNOSIS — Z794 Long term (current) use of insulin: Secondary | ICD-10-CM | POA: Diagnosis not present

## 2021-08-11 NOTE — Patient Outreach (Signed)
Per [place] eligible member currently resides in [facility].  Screened for potential [facility] care coordination services as a benefit of [employer] plan. ? ?Member's PCP at [facility] has [place] care coordination services available if needed post SNF. CCM services will need to be ordered by PCP if appropriate. ? ?[name] admitted to SNF on 08/08/21 after hospitalization. ? ?Secure communication sent to facility SW to make aware writer is following for transition plans and [facility] needs. ? ? ?Tara Rolling, MSN, RN,BSN ?[place] Coordinator ?[phone] [facility] ?[phone]  (Toll free office)   ?

## 2021-08-12 DIAGNOSIS — R609 Edema, unspecified: Secondary | ICD-10-CM | POA: Diagnosis not present

## 2021-08-12 DIAGNOSIS — R2681 Unsteadiness on feet: Secondary | ICD-10-CM | POA: Diagnosis not present

## 2021-08-12 DIAGNOSIS — S72142D Displaced intertrochanteric fracture of left femur, subsequent encounter for closed fracture with routine healing: Secondary | ICD-10-CM | POA: Diagnosis not present

## 2021-08-12 DIAGNOSIS — I4891 Unspecified atrial fibrillation: Secondary | ICD-10-CM | POA: Diagnosis not present

## 2021-08-12 DIAGNOSIS — J9601 Acute respiratory failure with hypoxia: Secondary | ICD-10-CM | POA: Diagnosis not present

## 2021-08-12 DIAGNOSIS — S72002A Fracture of unspecified part of neck of left femur, initial encounter for closed fracture: Secondary | ICD-10-CM | POA: Diagnosis not present

## 2021-08-12 DIAGNOSIS — E871 Hypo-osmolality and hyponatremia: Secondary | ICD-10-CM | POA: Diagnosis not present

## 2021-08-12 DIAGNOSIS — R601 Generalized edema: Secondary | ICD-10-CM | POA: Diagnosis not present

## 2021-08-12 DIAGNOSIS — R339 Retention of urine, unspecified: Secondary | ICD-10-CM | POA: Diagnosis not present

## 2021-08-12 DIAGNOSIS — Z79899 Other long term (current) drug therapy: Secondary | ICD-10-CM | POA: Diagnosis not present

## 2021-08-13 DIAGNOSIS — S72002A Fracture of unspecified part of neck of left femur, initial encounter for closed fracture: Secondary | ICD-10-CM | POA: Diagnosis not present

## 2021-08-13 DIAGNOSIS — I119 Hypertensive heart disease without heart failure: Secondary | ICD-10-CM | POA: Diagnosis not present

## 2021-08-13 DIAGNOSIS — R601 Generalized edema: Secondary | ICD-10-CM | POA: Diagnosis not present

## 2021-08-13 DIAGNOSIS — E118 Type 2 diabetes mellitus with unspecified complications: Secondary | ICD-10-CM | POA: Diagnosis not present

## 2021-08-13 DIAGNOSIS — D649 Anemia, unspecified: Secondary | ICD-10-CM | POA: Diagnosis not present

## 2021-08-13 DIAGNOSIS — S72142D Displaced intertrochanteric fracture of left femur, subsequent encounter for closed fracture with routine healing: Secondary | ICD-10-CM | POA: Diagnosis not present

## 2021-08-14 DIAGNOSIS — M5430 Sciatica, unspecified side: Secondary | ICD-10-CM | POA: Diagnosis not present

## 2021-08-14 DIAGNOSIS — E871 Hypo-osmolality and hyponatremia: Secondary | ICD-10-CM | POA: Diagnosis not present

## 2021-08-14 DIAGNOSIS — R609 Edema, unspecified: Secondary | ICD-10-CM | POA: Diagnosis not present

## 2021-08-17 DIAGNOSIS — R2681 Unsteadiness on feet: Secondary | ICD-10-CM | POA: Diagnosis not present

## 2021-08-17 DIAGNOSIS — I4891 Unspecified atrial fibrillation: Secondary | ICD-10-CM | POA: Diagnosis not present

## 2021-08-17 DIAGNOSIS — R339 Retention of urine, unspecified: Secondary | ICD-10-CM | POA: Diagnosis not present

## 2021-08-17 DIAGNOSIS — R601 Generalized edema: Secondary | ICD-10-CM | POA: Diagnosis not present

## 2021-08-17 DIAGNOSIS — S72142D Displaced intertrochanteric fracture of left femur, subsequent encounter for closed fracture with routine healing: Secondary | ICD-10-CM | POA: Diagnosis not present

## 2021-08-17 DIAGNOSIS — J9601 Acute respiratory failure with hypoxia: Secondary | ICD-10-CM | POA: Diagnosis not present

## 2021-08-18 ENCOUNTER — Ambulatory Visit: Payer: Medicare Other | Admitting: Student

## 2021-08-18 ENCOUNTER — Encounter: Payer: Self-pay | Admitting: Student

## 2021-08-18 ENCOUNTER — Other Ambulatory Visit: Payer: Self-pay

## 2021-08-18 VITALS — BP 118/74 | HR 72 | Ht 65.5 in

## 2021-08-18 DIAGNOSIS — I7123 Aneurysm of the descending thoracic aorta, without rupture: Secondary | ICD-10-CM

## 2021-08-18 DIAGNOSIS — E118 Type 2 diabetes mellitus with unspecified complications: Secondary | ICD-10-CM

## 2021-08-18 DIAGNOSIS — E875 Hyperkalemia: Secondary | ICD-10-CM

## 2021-08-18 DIAGNOSIS — Z794 Long term (current) use of insulin: Secondary | ICD-10-CM | POA: Diagnosis not present

## 2021-08-18 DIAGNOSIS — D649 Anemia, unspecified: Secondary | ICD-10-CM | POA: Diagnosis not present

## 2021-08-18 DIAGNOSIS — I482 Chronic atrial fibrillation, unspecified: Secondary | ICD-10-CM

## 2021-08-18 DIAGNOSIS — I10 Essential (primary) hypertension: Secondary | ICD-10-CM | POA: Diagnosis not present

## 2021-08-18 DIAGNOSIS — I5032 Chronic diastolic (congestive) heart failure: Secondary | ICD-10-CM | POA: Diagnosis not present

## 2021-08-18 DIAGNOSIS — E785 Hyperlipidemia, unspecified: Secondary | ICD-10-CM

## 2021-08-18 NOTE — Patient Instructions (Signed)
Medication Instructions:  ?Take Lasix 40 mg ( Take 1 Tablet Twice Daily for (3) Three Days Starting on 3/29). ?*If you need a refill on your cardiac medications before your next appointment, please call your pharmacy* ? ? ?Lab Work: ?BMET, BNP, CBC. Today. ?Digoxin Level : To Be Done on 08/19/21 Prior to Taking Daily Digoxin.  ?If you have labs (blood work) drawn today and your tests are completely normal, you will receive your results only by: ?MyChart Message (if you have MyChart) OR ?A paper copy in the mail ?If you have any lab test that is abnormal or we need to change your treatment, we will call you to review the results. ? ? ?Testing/Procedures: ?No Testing ? ? ?Follow-Up: ?At [facility], you and your health needs are our priority.  As part of our continuing mission to provide you with exceptional heart care, we have created designated Provider Care Teams.  These Care Teams include your primary Cardiologist (physician) and Advanced Practice Providers (APPs -  Physician Assistants and Nurse Practitioners) who all work together to provide you with the care you need, when you need it. ? ?We recommend signing up for the patient portal called "MyChart".  Sign up information is provided on this After Visit Summary.  MyChart is used to connect with patients for Virtual Visits (Telemedicine).  Patients are able to view lab/test results, encounter notes, upcoming appointments, etc.  Non-urgent messages can be sent to your provider as well.   ?To learn more about what you can do with MyChart, go to [email or web address].   ? ?Your next appointment:   ?2 week(s) ? ?The format for your next appointment:   ?In Person ? ?Provider:   ?Sande Rives, PA-C    Then, [name], MD will plan to see you again in 3 month(s).  ? ? ?Other Instructions ?Heart Failure Education: ? ?Weigh yourself EVERY morning after you go to the bathroom but before you eat or drink anything. Write this number down in a weight log/diary.  If you gain 3 pounds overnight or 5 pounds in a week, call the office. ?Take your medicines as prescribed. If you have concerns about your medications, please call us before you stop taking them. ?Eat low salt foods--Limit salt (sodium) to 2000 mg per day. This will help prevent your body from holding onto fluid. Read food labels as many processed foods have a lot of sodium, especially canned goods and prepackaged meats. If you would like some assistance choosing low sodium foods, we would be happy to set you up with a nutritionist. ?Limit all fluids for the day to less than 2 liters (64 ounces). Fluid includes all drinks, coffee, juice, ice chips, soup, jello, and all other liquids. ?Stay as active as you can everyday. Staying active will give you more energy and make your muscles stronger. Start with 5 minutes at a time and work your way up to 30 minutes a day. Break up your activities--do some in the morning and some in the afternoon. Start with 3 days per week and work your way up to 5 days as you can.  If you have chest pain, feel short of breath, dizzy, or lightheaded, STOP. If you don't feel better after a short rest, call 911. If you do feel better, call the office to let us know you have symptoms with exercise. ?  ?

## 2021-08-19 DIAGNOSIS — R339 Retention of urine, unspecified: Secondary | ICD-10-CM | POA: Diagnosis not present

## 2021-08-19 DIAGNOSIS — R601 Generalized edema: Secondary | ICD-10-CM | POA: Diagnosis not present

## 2021-08-19 DIAGNOSIS — J9601 Acute respiratory failure with hypoxia: Secondary | ICD-10-CM | POA: Diagnosis not present

## 2021-08-19 DIAGNOSIS — I4891 Unspecified atrial fibrillation: Secondary | ICD-10-CM | POA: Diagnosis not present

## 2021-08-19 DIAGNOSIS — S72142D Displaced intertrochanteric fracture of left femur, subsequent encounter for closed fracture with routine healing: Secondary | ICD-10-CM | POA: Diagnosis not present

## 2021-08-19 DIAGNOSIS — R2681 Unsteadiness on feet: Secondary | ICD-10-CM | POA: Diagnosis not present

## 2021-08-19 LAB — BASIC METABOLIC PANEL
BUN/Creatinine Ratio: 27 (ref 12–28)
BUN: 21 mg/dL (ref 10–36)
CO2: 29 mmol/L (ref 20–29)
Calcium: 9.1 mg/dL (ref 8.7–10.3)
Chloride: 99 mmol/L (ref 96–106)
Creatinine, Ser: 0.79 mg/dL (ref 0.57–1.00)
Glucose: 199 mg/dL — ABNORMAL HIGH (ref 70–99)
Potassium: 5.2 mmol/L (ref 3.5–5.2)
Sodium: 137 mmol/L (ref 134–144)
eGFR: 68 mL/min/{1.73_m2} (ref >59)

## 2021-08-19 LAB — CBC
Hematocrit: 28.8 % — ABNORMAL LOW (ref 34.0–46.6)
Hemoglobin: 9.1 g/dL — ABNORMAL LOW (ref 11.1–15.9)
MCH: 29.8 pg (ref 26.6–33.0)
MCHC: 31.6 g/dL (ref 31.5–35.7)
MCV: 94 fL (ref 79–97)
Platelets: 370 10*3/uL (ref 150–450)
RBC: 3.05 x10E6/uL — ABNORMAL LOW (ref 3.77–5.28)
RDW: 16 % — ABNORMAL HIGH (ref 11.7–15.4)
WBC: 8.2 10*3/uL (ref 3.4–10.8)

## 2021-08-19 LAB — BRAIN NATRIURETIC PEPTIDE: BNP: 426.2 pg/mL — ABNORMAL HIGH (ref 0.0–100.0)

## 2021-08-20 ENCOUNTER — Telehealth: Payer: Self-pay | Admitting: Cardiology

## 2021-08-20 ENCOUNTER — Telehealth: Payer: Self-pay | Admitting: Student

## 2021-08-20 DIAGNOSIS — I4891 Unspecified atrial fibrillation: Secondary | ICD-10-CM | POA: Diagnosis not present

## 2021-08-20 DIAGNOSIS — R601 Generalized edema: Secondary | ICD-10-CM | POA: Diagnosis not present

## 2021-08-20 DIAGNOSIS — S72142D Displaced intertrochanteric fracture of left femur, subsequent encounter for closed fracture with routine healing: Secondary | ICD-10-CM | POA: Diagnosis not present

## 2021-08-20 DIAGNOSIS — J9601 Acute respiratory failure with hypoxia: Secondary | ICD-10-CM | POA: Diagnosis not present

## 2021-08-20 DIAGNOSIS — R2681 Unsteadiness on feet: Secondary | ICD-10-CM | POA: Diagnosis not present

## 2021-08-20 DIAGNOSIS — R339 Retention of urine, unspecified: Secondary | ICD-10-CM | POA: Diagnosis not present

## 2021-08-20 NOTE — Telephone Encounter (Signed)
See previous telephone encounter- they were having blood work rechecked.  ?Patient also wanted dig level, results is below.  ? ?Thanks! ? ?

## 2021-08-20 NOTE — Telephone Encounter (Signed)
Spoke with receptionist for fax number at [facility]. Will fax orders from [facility] [place] regarding current lab results and orders. Patient's nurse is Windell Norfolk. Telephone [phone]; [phone]. ?

## 2021-08-20 NOTE — Telephone Encounter (Signed)
Thank you! Digoxin level OK. Continue current dose. ? ?Thank you! ?

## 2021-08-20 NOTE — Telephone Encounter (Signed)
Follow Up: ? ? ? ? ?Patient is returning Michelle's call from today, concerning her lab results. ?

## 2021-08-20 NOTE — Telephone Encounter (Signed)
Tara Bradley, from [facility] called because patient wanted her digoxin level check when they did labs.  Her digoxin level was 0.53.  ?

## 2021-08-21 NOTE — Telephone Encounter (Signed)
[facility] notified will continue current dose ?

## 2021-08-26 DIAGNOSIS — I10 Essential (primary) hypertension: Secondary | ICD-10-CM | POA: Diagnosis not present

## 2021-08-26 DIAGNOSIS — E871 Hypo-osmolality and hyponatremia: Secondary | ICD-10-CM | POA: Diagnosis not present

## 2021-08-26 DIAGNOSIS — R609 Edema, unspecified: Secondary | ICD-10-CM | POA: Diagnosis not present

## 2021-08-26 DIAGNOSIS — I5030 Unspecified diastolic (congestive) heart failure: Secondary | ICD-10-CM | POA: Diagnosis not present

## 2021-08-26 DIAGNOSIS — M5430 Sciatica, unspecified side: Secondary | ICD-10-CM | POA: Diagnosis not present

## 2021-08-26 NOTE — Progress Notes (Signed)
?Cardiology Office Note:   ? ?Date:  09/02/2021  ? ?ID:  Tara Bradley, DOB 1923-04-30, MRN [id number] ? ?PCP:  Tara Jordan, MD  ?Cardiologist:  Tara Breeding, MD  ?Electrophysiologist:  None  ? ?Referring MD: Tara Jordan, MD  ? ?Chief Complaint: follow-up of CHF ? ?History of Present Illness:   ? ?Tara Bradley is a 88 y.o. female with a history of with a history of chronic atrial fibrillation Xarelto, chronic diastolic CHF, aortic aneurysm with chronic descending dissection, aortic valve sclerosis, hypertension, hyperlipidemia, type 2 diabetes mellitus, and recent left hip fracture who is followed by Dr. Percival Bradley and presents today for follow-up of CHF. ?  ?Patient has had a couple of hospitalizations over the last few months. She was admitted in 04/2021 with a right humerus fracture after a fall and underwent reverse shoulder arthroplasty. Hospitalization was complicated by acute on chronic diastolic CHF and uncontrolled atrial fibrillation. She was readmitted from 06/22/2021 to 06/26/2021 acute on chronic diastolic CHF and possible viral pneumonia. Echo showed LVEF 55-60% with normal wall motion and grade II diastolic dysfunction.  RV normal. Also showed mild MR, moderate to severe TR, and moderately elevated PASP of 52.1 mmHg. She was diuresed. Rates were again difficult to control during admission and medications were adjusted. She was then admitted from 08/02/2021 to 08/08/2021 for a left hip fracture after a mechanical fall at home. She underwent intramedullary nailing on 3/12. Postoperatively, she became hypotensive and anemic secondary to acute blood loss anemia. Hemoglobin dropped from 12.3 to as low as 7.1 and she required 1 unit of PRBCs. She also developed acute urinary retention requiring Foley placement. She has chronic atrial fibrillation and heart rates were uncontrolled for most of admission. When she became hypotensive, patient's home Cardizem and Toprol were reduced. Cardiology was  ultimately consulted and patient was placed back on home dose of Cardizem and Toprol and was also restarted on Digoxin 0.0625 (this had fallen off her medication list so was not started on admission but patient confirmed she was still taking this at home).  ? ?She was seen by me for follow-up on 08/18/2021 at which time she was recovering very slowly and had significant edema of her left leg. Dopplers at [facility] were reportedly negative for DVT. She also reported new dyspnea on exertion. Symptoms were concerning for CHF exacerbation. BNP came back elevated at 426. Therefore, Lasix was was increased for 1 week. ? ?Since last office visit, patient was seen in the ED on 08/27/2021 for progressive shortness of breath.  BNP elevated at 605.  High-sensitivity troponin negative x2. Lactic acid normal.  Chest x-ray showed cardiomegaly with central pulmonary vascular congestion, small bilateral pleural effusions, and bibasilar infiltrates.  She was given a dose of IV Lasix in the ED with good urinary response and improvement in symptoms.  Therefore, she was felt to be stable for discharge back to SNF. ? ?Patient presents today for follow-up.  Here alone.  Patient continues to have significant significant dyspnea on exertion with minimal activity such as ambulating to the bathroom.  This is all new.  She was not having dyspnea on exertion prior to her admission last month.  She reports mild orthopnea but no PND.  It sounds like they also treated her with a short course of doxycycline for possible pneumonia following recent ED visit; however, she notes no significant improvement with this.  She is requiring more supplemental O2 at SNF - as much as 3L. She was not  on any supplemental O2 prior to recent admission She continues to have significant lower extremity edema.  No chest pain, palpitations, lightheadedness, dizziness, syncope. ? ?She is 30 lbs up from where she was in 06/2021. She has failed outpatient therapy and will  need to be admitted for IV diuresis. ? ?Past Medical History:  ?Diagnosis Date  ? Aortic valve sclerosis   ? Echo, 2008  ? Arthritis   ? Bradycardia   ? October, 2012  ? Cancer [facility])   ? Chronic atrial fibrillation (HCC)   ? Chronic diastolic CHF (congestive heart failure) ([place])   ? Chronic insomnia   ? Closed fracture of unspecified part of femur 2005  ? Diabetes mellitus, type 2 ([place])   ? Diverticulitis   ? Diverticulosis   ? Hyperlipidemia   ? Hypertension   ? Long term (current) use of anticoagulants   ? Osteopenia   ? Osteoporosis   ? femur fracture 2005, pelvic fracture 2006  ? Personal history of malignant neoplasm of breast   ? Pneumonia   ? Syncope   ? Thoracic aortic aneurysm ([place]) 02/2017  ? chronic descending aorta dissection  ? Unspecified closed fracture of pelvis 2006  ? ? ?Past Surgical History:  ?Procedure Laterality Date  ? CARDIOVERSION N/A 08/09/2012  ? Procedure: CARDIOVERSION;  Surgeon: Deboraha Sprang, MD;  Location: [place];  Service: Cardiovascular;  Laterality: N/A;  ? EYE SURGERY    ? FEMUR SURGERY  2005  ? ORIF  ? INTRAMEDULLARY (IM) NAIL INTERTROCHANTERIC Left 08/02/2021  ? Procedure: INTRAMEDULLARY (IM) NAIL INTERTROCHANTRIC;  Surgeon: Willaim Sheng, MD;  Location: [place];  Service: Orthopedics;  Laterality: Left;  ? IR ANGIOGRAM PELVIS SELECTIVE OR SUPRASELECTIVE  03/11/2017  ? IR ANGIOGRAM SELECTIVE EACH ADDITIONAL VESSEL  03/11/2017  ? IR EMBO ART  VEN HEMORR LYMPH EXTRAV  INC GUIDE ROADMAPPING  03/11/2017  ? IR FLUORO GUIDE CV LINE RIGHT  03/11/2017  ? IR US GUIDE VASC ACCESS RIGHT  03/11/2017  ? IR US GUIDE VASC ACCESS RIGHT  03/11/2017  ? MASTECTOMY  1995  ? left  ? MINOR HARDWARE REMOVAL Left 09/01/2020  ? Procedure: REMOVAL OF HARDWARE LEFT FOREARM;  Surgeon: Milly Jakob, MD;  Location: [place];  Service: Orthopedics;  Laterality: Left;  ? ORIF ULNAR FRACTURE Left 10/19/2019  ? Procedure: OPEN REDUCTION INTERNAL FIXATION (ORIF)  LEFT DISTAL RADIUS   AND ULNA FRACTURE;  Surgeon: Milly Jakob, MD;  Location: [place];  Service: Orthopedics;  Laterality: Left;  ? REVERSE SHOULDER ARTHROPLASTY Right 05/04/2021  ? Procedure: REVERSE SHOULDER ARTHROPLASTY;  Surgeon: Netta Cedars, MD;  Location: [place];  Service: Orthopedics;  Laterality: Right;  ? SHOULDER SURGERY    ? left  ? [place], 2003  ? TONSILLECTOMY    ? TOTAL ABDOMINAL HYSTERECTOMY W/ BILATERAL SALPINGOOPHORECTOMY  1995  ? WRIST SURGERY    ?  x 2   ? ? ?Current Medications: ?Current Meds  ?Medication Sig  ? acetaminophen (TYLENOL) 500 MG tablet Take 2 tablets (1,000 mg total) by mouth every 8 (eight) hours as needed.  ? BD PEN NEEDLE NANO U/F 32G X 4 MM MISC Inject into the skin daily.  ? Calcium Carbonate-Vitamin D 600-400 MG-UNIT tablet Take 1 tablet by mouth daily.  ? Cholecalciferol (VITAMIN D) 2000 UNITS tablet Take 1,000 Units by mouth daily.  ? digoxin 62.5 MCG TABS Take 0.0625 mg by mouth daily.  ? diltiazem (CARDIZEM CD) 360 MG 24 hr capsule  Take 1 capsule (360 mg total) by mouth daily.  ? furosemide (LASIX) 40 MG tablet TAKE ONE TABLET BY MOUTH DAILY (Patient taking differently: Take 40 mg by mouth daily.)  ? gabapentin (NEURONTIN) 100 MG capsule Take 100 mg by mouth 3 (three) times daily.  ? Insulin Lispro Prot & Lispro (HUMALOG MIX 50/50 KWIKPEN) (50-50) 100 UNIT/ML Kwikpen Inject 4-5 Units into the skin in the morning and at bedtime. Take 5 units in the morning after breakfast and 4 units at night after supper.  ? Magnesium Oxide 250 MG TABS Take 250 mg by mouth daily.  ? metFORMIN (GLUMETZA) 500 MG (MOD) 24 hr tablet Take 1,000 mg by mouth every evening.  ? metoprolol succinate (TOPROL-XL) 100 MG 24 hr tablet TAKE ONE TABLET BY MOUTH TWICE DAILY. TAKE WITH OR IMMEDIATELY FOLLOWING A MEAL (Patient taking differently: Take 100 mg by mouth in the morning and at bedtime.)  ? metoprolol succinate (TOPROL-XL) 25 MG 24 hr tablet Take 25 mg by mouth 2 (two) times daily as needed.  ?  multivitamin-lutein (OCUVITE-LUTEIN) CAPS capsule Take 1 capsule by mouth daily.  ? polyethylene glycol (MIRALAX / GLYCOLAX) 17 g packet Take 17 g by mouth daily. (Patient taking differently: Take 17 g by mouth d

## 2021-08-27 ENCOUNTER — Other Ambulatory Visit: Payer: Self-pay | Admitting: *Deleted

## 2021-08-27 ENCOUNTER — Emergency Department (HOSPITAL_COMMUNITY)
Admission: EM | Admit: 2021-08-27 | Discharge: 2021-08-28 | Disposition: A | Discharge status: Home / Self Care | Payer: Medicare Other | Attending: Emergency Medicine | Admitting: Emergency Medicine

## 2021-08-27 ENCOUNTER — Other Ambulatory Visit: Payer: Self-pay

## 2021-08-27 ENCOUNTER — Emergency Department (HOSPITAL_COMMUNITY): Payer: Medicare Other

## 2021-08-27 ENCOUNTER — Encounter (HOSPITAL_COMMUNITY): Payer: Self-pay | Admitting: Emergency Medicine

## 2021-08-27 DIAGNOSIS — I517 Cardiomegaly: Secondary | ICD-10-CM | POA: Diagnosis not present

## 2021-08-27 DIAGNOSIS — I4891 Unspecified atrial fibrillation: Secondary | ICD-10-CM | POA: Diagnosis not present

## 2021-08-27 DIAGNOSIS — I110 Hypertensive heart disease with heart failure: Secondary | ICD-10-CM | POA: Diagnosis not present

## 2021-08-27 DIAGNOSIS — R0689 Other abnormalities of breathing: Secondary | ICD-10-CM | POA: Diagnosis not present

## 2021-08-27 DIAGNOSIS — Z7984 Long term (current) use of oral hypoglycemic drugs: Secondary | ICD-10-CM | POA: Insufficient documentation

## 2021-08-27 DIAGNOSIS — I5043 Acute on chronic combined systolic (congestive) and diastolic (congestive) heart failure: Secondary | ICD-10-CM

## 2021-08-27 DIAGNOSIS — R0602 Shortness of breath: Secondary | ICD-10-CM

## 2021-08-27 DIAGNOSIS — R600 Localized edema: Secondary | ICD-10-CM | POA: Insufficient documentation

## 2021-08-27 DIAGNOSIS — Z20822 Contact with and (suspected) exposure to covid-19: Secondary | ICD-10-CM | POA: Insufficient documentation

## 2021-08-27 DIAGNOSIS — I5030 Unspecified diastolic (congestive) heart failure: Secondary | ICD-10-CM | POA: Diagnosis not present

## 2021-08-27 DIAGNOSIS — Z79899 Other long term (current) drug therapy: Secondary | ICD-10-CM | POA: Diagnosis not present

## 2021-08-27 DIAGNOSIS — Z91040 Latex allergy status: Secondary | ICD-10-CM | POA: Insufficient documentation

## 2021-08-27 DIAGNOSIS — J90 Pleural effusion, not elsewhere classified: Secondary | ICD-10-CM | POA: Diagnosis not present

## 2021-08-27 DIAGNOSIS — I499 Cardiac arrhythmia, unspecified: Secondary | ICD-10-CM | POA: Diagnosis not present

## 2021-08-27 DIAGNOSIS — E119 Type 2 diabetes mellitus without complications: Secondary | ICD-10-CM | POA: Insufficient documentation

## 2021-08-27 DIAGNOSIS — J811 Chronic pulmonary edema: Secondary | ICD-10-CM | POA: Diagnosis not present

## 2021-08-27 DIAGNOSIS — Z794 Long term (current) use of insulin: Secondary | ICD-10-CM | POA: Diagnosis not present

## 2021-08-27 DIAGNOSIS — Z743 Need for continuous supervision: Secondary | ICD-10-CM | POA: Diagnosis not present

## 2021-08-27 DIAGNOSIS — I5032 Chronic diastolic (congestive) heart failure: Secondary | ICD-10-CM | POA: Insufficient documentation

## 2021-08-27 DIAGNOSIS — G479 Sleep disorder, unspecified: Secondary | ICD-10-CM | POA: Diagnosis not present

## 2021-08-27 DIAGNOSIS — S72002A Fracture of unspecified part of neck of left femur, initial encounter for closed fracture: Secondary | ICD-10-CM | POA: Diagnosis not present

## 2021-08-27 LAB — CBC WITH DIFFERENTIAL/PLATELET
Abs Immature Granulocytes: 0.02 10*3/uL (ref 0.00–0.07)
Basophils Absolute: 0 10*3/uL (ref 0.0–0.1)
Basophils Relative: 1 %
Eosinophils Absolute: 0.1 10*3/uL (ref 0.0–0.5)
Eosinophils Relative: 2 %
HCT: 29.9 % — ABNORMAL LOW (ref 36.0–46.0)
Hemoglobin: 8.7 g/dL — ABNORMAL LOW (ref 12.0–15.0)
Immature Granulocytes: 1 %
Lymphocytes Relative: 10 %
Lymphs Abs: 0.4 10*3/uL — ABNORMAL LOW (ref 0.7–4.0)
MCH: 29.9 pg (ref 26.0–34.0)
MCHC: 29.1 g/dL — ABNORMAL LOW (ref 30.0–36.0)
MCV: 102.7 fL — ABNORMAL HIGH (ref 80.0–100.0)
Monocytes Absolute: 0.5 10*3/uL (ref 0.1–1.0)
Monocytes Relative: 12 %
Neutro Abs: 3.2 10*3/uL (ref 1.7–7.7)
Neutrophils Relative %: 74 %
Platelets: 255 10*3/uL (ref 150–400)
RBC: 2.91 MIL/uL — ABNORMAL LOW (ref 3.87–5.11)
RDW: 17.8 % — ABNORMAL HIGH (ref 11.5–15.5)
WBC: 4.3 10*3/uL (ref 4.0–10.5)
nRBC: 0 % (ref 0.0–0.2)

## 2021-08-27 NOTE — ED Triage Notes (Signed)
Patient from [place], complaining of increased shortness of breath for the last three days.  No chest pain, no nausea or vomiting.   History of CHF, she does have swelling noted to bilateral legs and belly.  BS decreased in the lower lobes, no crackles.  O2 sat of 95% on 3L.  Usually on 1L via [place] at facility.  Patient did desat upon ambulation at facility.   ?

## 2021-08-27 NOTE — Patient Outreach (Signed)
[facility] Post- Acute Care Coordinator follow up. Per [place] eligible member currently resides in [facility].  Screening for potential [facility] Care Management services as a benefit of member's insurance plan. ? ?Facility site visit to [facility] skilled nursing facility. Met with Irine Seal, SNF SW concerning transition plan and potential [facility] needs. Anticipated transition plan is to return home.  ? ?Spoke with Mrs. Goncalves in room at [facility] to discuss transition plans and [place] Management services. Mrs. Quiggle endorses she lives alone. States she plans to hire caregivers upon returning home. Mrs. Schissler indicates her SNF stay has been complicated by pneumonia and CHF. States medications have been added and adjusted to treat her current conditions. Also she is new to oxygen. She is hopeful oxygen will be weaned prior to returning home. Transition date not known at this time. ? ?Explained [facility] Care Management services. Mrs. Sullenger is agreeable.  ? ?Confirmed PCP is Dr. Stephanie Acre with [facility] at [place] ?Confirmed best contact number for Mrs. Khurana is [phone]. ? ?Provided [facility] Care Management brochure, 24-hr nurse advice line magnet, and writer's contact information.  ? ?Will continue to follow while Mrs. Budden resides in [place]. Will plan to make referral to [place] upon SNF discharge. ? ? ? ?Marthenia Rolling, MSN, RN,BSN ?[place] Coordinator ?[phone] [facility]) ?[phone]  (Toll free office)   ? ? ? ? ?  ?

## 2021-08-27 NOTE — ED Provider Notes (Signed)
?[facility] ?Provider Note ? ? ?CSN: [id number] ?Arrival date & time: 08/27/21  2156 ? ?  ? ?History ?Chief Complaint  ?Patient presents with  ? Shortness of Breath  ? ? ?Tara Bradley is a 88 y.o. female w/ PMHx of chronic atrial fibrillation Xarelto, chronic diastolic CHF, aortic aneurysm with chronic descending dissection, aortic valve sclerosis, hypertension, hyperlipidemia, type 2 diabetes mellitus, and recent left hip fracture presenting for worsening shortness of breath over the last 3 days.  Patient reports being short of breath since she was last discharged from the hospital but it is progressively getting worse.  Patient denies any chest pain.  Denies any headaches, fevers, chills, cough congestion, runny nose.  Per EMS patient desaturated while ambulating at facility.  Patient is on 1 L of oxygen at baseline as needed since her last discharge. ? ? ?Shortness of Breath ? ?  ? ?Home Medications ?Prior to Admission medications   ?Medication Sig Start Date End Date Taking? Authorizing Provider  ?acetaminophen (TYLENOL) 500 MG tablet Take 2 tablets (1,000 mg total) by mouth every 8 (eight) hours as needed. 08/04/21 09/03/21  Willaim Sheng, MD  ?BD PEN NEEDLE NANO U/F 32G X 4 MM MISC Inject into the skin daily. 11/25/20   [provider]  ?Calcium Carbonate-Vitamin D 600-400 MG-UNIT tablet Take 1 tablet by mouth daily.    [provider]  ?Cholecalciferol (VITAMIN D) 2000 UNITS tablet Take 1,000 Units by mouth daily.    [provider]  ?digoxin 62.5 MCG TABS Take 0.0625 mg by mouth daily. 08/07/21   Shelly Coss, MD  ?diltiazem (CARDIZEM CD) 360 MG 24 hr capsule Take 1 capsule (360 mg total) by mouth daily. 05/12/21   Shelly Coss, MD  ?furosemide (LASIX) 40 MG tablet TAKE ONE TABLET BY MOUTH DAILY ?Patient taking differently: Take 40 mg by mouth daily. 07/29/21   [name], MD  ?gabapentin (NEURONTIN) 100 MG capsule Take 100 mg by  mouth 3 (three) times daily. 08/14/21   [provider]  ?Insulin Lispro Prot & Lispro (HUMALOG MIX 50/50 KWIKPEN) (50-50) 100 UNIT/ML Kwikpen Inject 4-5 Units into the skin in the morning and at bedtime. Take 5 units in the morning after breakfast and 4 units at night after supper.    [provider]  ?Magnesium Oxide 250 MG TABS Take 250 mg by mouth daily.    [provider]  ?metFORMIN (GLUMETZA) 500 MG (MOD) 24 hr tablet Take 1,000 mg by mouth every evening.    [provider]  ?metoprolol succinate (TOPROL-XL) 100 MG 24 hr tablet TAKE ONE TABLET BY MOUTH TWICE DAILY. TAKE WITH OR IMMEDIATELY FOLLOWING A MEAL ?Patient taking differently: Take 100 mg by mouth in the morning and at bedtime. 07/29/21   [name], MD  ?multivitamin-lutein [facility]) CAPS capsule Take 1 capsule by mouth daily.    [provider]  ?polyethylene glycol (MIRALAX / GLYCOLAX) 17 g packet Take 17 g by mouth daily. ?Patient taking differently: Take 17 g by mouth daily as needed for mild constipation. 05/12/21   Shelly Coss, MD  ?potassium chloride (K-DUR) 10 MEQ tablet Take 10 mEq by mouth daily.  03/25/15   [provider]  ?senna (SENOKOT) 8.6 MG TABS tablet Take 1 tablet (8.6 mg total) by mouth 2 (two) times daily. 08/07/21   Shelly Coss, MD  ?tamsulosin (FLOMAX) 0.4 MG CAPS capsule Take 1 capsule (0.4 mg total) by mouth daily. 08/08/21   Shelly Coss,  MD  ?XARELTO 15 MG TABS tablet TAKE 1 TABLET ONCE DAILY WITH SUPPER. ?Patient taking differently: Take 15 mg by mouth daily with supper. 06/22/21   [name], MD  ?   ? ?Allergies    ?Ambien [zolpidem], Codeine, Codeine sulfate, Dulaglutide, Latex, Morphine sulfate, and Mirtazapine   ? ?Review of Systems   ?Review of Systems  ?Respiratory:  Positive for shortness of breath.   ?Cardiovascular:  Positive for leg swelling.  ? ?Physical Exam ?Updated Vital Signs ?BP 128/74   Pulse 94   Temp 97.9 ?F (36.6 ?C) (Oral)    Resp 18   SpO2 100%  ?Physical Exam ?Vitals and nursing note reviewed.  ?Constitutional:   ?   Appearance: She is ill-appearing.  ?Cardiovascular:  ?   Rate and Rhythm: Normal rate. Rhythm irregular.  ?Pulmonary:  ?   Breath sounds: Decreased breath sounds present.  ?Chest:  ?   Chest wall: No tenderness.  ?Abdominal:  ?   Tenderness: There is no abdominal tenderness.  ?Musculoskeletal:  ?   Right lower leg: No tenderness. Edema present.  ?   Left lower leg: No tenderness. Edema present.  ?Neurological:  ?   General: No focal deficit present.  ?   Mental Status: She is alert and oriented to person, place, and time.  ?Psychiatric:     ?   Mood and Affect: Mood normal.     ?   Behavior: Behavior normal.  ? ? ?ED Results / Procedures / Treatments   ?Labs ?(all labs ordered are listed, but only abnormal results are displayed) ?Labs Reviewed  ?RESP PANEL BY RT-PCR (FLU A&B, COVID) [id number]  ?CBC WITH DIFFERENTIAL/PLATELET  ?BRAIN NATRIURETIC PEPTIDE  ?COMPREHENSIVE METABOLIC PANEL  ?LACTIC ACID, PLASMA  ?LACTIC ACID, PLASMA  ?TSH  ?TROPONIN I (HIGH SENSITIVITY)  ? ? ?EKG ?None ? ?Radiology ?No results found. ? ?Procedures ?Procedures  ? ?Medications Ordered in ED ?Medications - No data to display ? ?ED Course/ Medical Decision Making/ A&P ?  ?                        ?Medical Decision Making ?Amount and/or Complexity of Data Reviewed ?Labs: ordered. ?Radiology: ordered. ? ? ?On chart review patient has had multiple hospitalizations over the last few months.  Most recent admission from 3/12 - 3/18 for a left hip fracture after mechanical fall at home.  Her chronic A-fib was hard to control during that admission.  Admission from 1/30 - 06/26/2021 for chronic diastolic congestive heart failure and likely viral pneumonia also complicated by her chronic A-fib due to difficulty controlling her rates.  Patient is on Cardizem and digoxin currently.  She is at a rehab facility, [facility] and has been on 1 L of supplemental  oxygen as needed. ? ?On exam, significant bilateral lower extremity pitting edema.  Diminished breath sound bilaterally.  Irregular heart rate.  Rate controlled.  Cardiac monitoring confirmed irregular heart rate on palpation which is rate controlled at a heart rate of 90.  Patient is ill-appearing.  Satting 100% on 3L on [place].  Will obtain CBC, CMP, BNP, chest x-ray, troponin, lactic and TSH to better assess volume status and reason for increasing shortness of breath. ? ?Patient's care transition to oncoming team at time of signout.  Labs, imaging, and EKG pending at that time. ? ?Patient seen in conjunction my attending Dr. Laverta Baltimore. ? ?Final Clinical Impression(s) / ED Diagnoses ?Final diagnoses:  ?Shortness of breath  ? ? ?  Rx / DC Orders ?ED Discharge Orders   ? ? None  ? ?  ? ? ?  ?Lupita Dawn, MD ?08/27/21 2256 ? ?  ?Margette Fast, MD ?08/28/21 1242 ? ?

## 2021-08-28 DIAGNOSIS — I4891 Unspecified atrial fibrillation: Secondary | ICD-10-CM | POA: Diagnosis not present

## 2021-08-28 DIAGNOSIS — R531 Weakness: Secondary | ICD-10-CM | POA: Diagnosis not present

## 2021-08-28 DIAGNOSIS — R609 Edema, unspecified: Secondary | ICD-10-CM | POA: Diagnosis not present

## 2021-08-28 DIAGNOSIS — I5030 Unspecified diastolic (congestive) heart failure: Secondary | ICD-10-CM | POA: Diagnosis not present

## 2021-08-28 DIAGNOSIS — Z7401 Bed confinement status: Secondary | ICD-10-CM | POA: Diagnosis not present

## 2021-08-28 LAB — COMPREHENSIVE METABOLIC PANEL
ALT: 9 U/L (ref 0–44)
AST: 15 U/L (ref 15–41)
Albumin: 2.9 g/dL — ABNORMAL LOW (ref 3.5–5.0)
Alkaline Phosphatase: 174 U/L — ABNORMAL HIGH (ref 38–126)
Anion gap: 5 (ref 5–15)
BUN: 19 mg/dL (ref 8–23)
CO2: 33 mmol/L — ABNORMAL HIGH (ref 22–32)
Calcium: 8.8 mg/dL — ABNORMAL LOW (ref 8.9–10.3)
Chloride: 99 mmol/L (ref 98–111)
Creatinine, Ser: 0.75 mg/dL (ref 0.44–1.00)
GFR, Estimated: >60 mL/min (ref >60)
Glucose, Bld: 195 mg/dL — ABNORMAL HIGH (ref 70–99)
Potassium: 5 mmol/L (ref 3.5–5.1)
Sodium: 137 mmol/L (ref 135–145)
Total Bilirubin: 0.6 mg/dL (ref 0.3–1.2)
Total Protein: 5.5 g/dL — ABNORMAL LOW (ref 6.5–8.1)

## 2021-08-28 LAB — TROPONIN I (HIGH SENSITIVITY)
Troponin I (High Sensitivity): 7 ng/L (ref <18)
Troponin I (High Sensitivity): 8 ng/L (ref <18)

## 2021-08-28 LAB — BRAIN NATRIURETIC PEPTIDE: B Natriuretic Peptide: 605.8 pg/mL — ABNORMAL HIGH (ref 0.0–100.0)

## 2021-08-28 LAB — RESP PANEL BY RT-PCR (FLU A&B, COVID) ARPGX2
Influenza A by PCR: NEGATIVE
Influenza B by PCR: NEGATIVE
SARS Coronavirus 2 by RT PCR: NEGATIVE

## 2021-08-28 LAB — LACTIC ACID, PLASMA
Lactic Acid, Venous: 0.7 mmol/L (ref 0.5–1.9)
Lactic Acid, Venous: 1 mmol/L (ref 0.5–1.9)

## 2021-08-28 LAB — TSH: TSH: 5.651 u[IU]/mL — ABNORMAL HIGH (ref 0.350–4.500)

## 2021-08-28 MED ORDER — FUROSEMIDE 10 MG/ML IJ SOLN
40.0000 mg | Freq: Once | INTRAMUSCULAR | Status: AC
Start: 2021-08-28 — End: 2021-08-28
  Administered 2021-08-28: 40 mg via INTRAVENOUS
  Filled 2021-08-28: qty 4

## 2021-08-28 NOTE — Discharge Instructions (Addendum)
Please increase the dose of Lasix from '40mg'$  daily to 40 mg in the morning and 20 mg 12 hrs later for 5-7 days. Follow up with cardiology for her heart failure. ?

## 2021-08-28 NOTE — ED Provider Notes (Addendum)
I assumed care of this patient.  Please see previous provider note for further details of Hx, PE.  Briefly patient is a 88 y.o. female who presented shortness of breath.  Requiring increased oxygen demand from 1 L nasal cannula to 3 L nasal cannula.  Known history of A-fib on anticoagulation, chronic diastolic heart failure on Lasix.  Currently pending labs and imaging. ? ? ?Labs are grossly reassuring without leukocytosis.  Stable hemoglobin. ?No significant electrolyte derangements or renal sufficiency. ?Troponins negative x2. ?BnP was elevated and chest x-ray is consistent with heart failure. ?She was given a dose of IV Lasix and voided well. ? ?We were able to wean her down to her 1 L nasal cannula. ?She was monitored for several hours throughout her ED stay. ?Did not desat on 1 L. ?Reported feeling resolved shortness of breath. ?She remained rate controlled without intervention. ? ?Feel patient is stable for discharge back to her skilled nursing facility with increase dose of lasix and cardiology follow up. ? ?.Critical Care ?Performed by: Fatima Blank, MD ?Authorized by: Fatima Blank, MD  ? ?Critical care provider statement:  ?  Critical care time (minutes):  60 ?  Critical care time was exclusive of:  Separately billable procedures and treating other patients ?  Critical care was necessary to treat or prevent imminent or life-threatening deterioration of the following conditions:  Cardiac failure ?  Critical care was time spent personally by me on the following activities:  Development of treatment plan with patient or surrogate, discussions with consultants, evaluation of patient's response to treatment, examination of patient, obtaining history from patient or surrogate, review of old charts, re-evaluation of patient's condition, pulse oximetry, ordering and review of radiographic studies, ordering and review of laboratory studies and ordering and performing treatments and  interventions ? ? ?The patient appears reasonably screened and/or stabilized for discharge and I doubt any other medical condition or other [facility] requiring further screening, evaluation, or treatment in the ED at this time prior to discharge. Safe for discharge with strict return precautions. ? ?Disposition: Discharge ? ?Condition: Good ? ?I have discussed the results, Dx and Tx plan with the patient/family who expressed understanding and agree(s) with the plan. Discharge instructions discussed at length. The patient/family was given strict return precautions who verbalized understanding of the instructions. No further questions at time of discharge.  ? ? ?ED Discharge Orders   ? ? None  ? ?  ? ? ? ?Follow Up: ?[name], MD ?[address] ?[address] ?[place] [address] ?[phone] ? ?Call  ?to schedule an appointment for close follow up ? ? ? ? ?  ?Fatima Blank, MD ?08/28/21 0559 ? ?  ?Fatima Blank, MD ?08/28/21 0600 ? ?

## 2021-08-31 ENCOUNTER — Encounter: Payer: Self-pay | Admitting: Student

## 2021-08-31 DIAGNOSIS — J9601 Acute respiratory failure with hypoxia: Secondary | ICD-10-CM | POA: Diagnosis not present

## 2021-08-31 DIAGNOSIS — R601 Generalized edema: Secondary | ICD-10-CM | POA: Diagnosis not present

## 2021-08-31 DIAGNOSIS — S72142D Displaced intertrochanteric fracture of left femur, subsequent encounter for closed fracture with routine healing: Secondary | ICD-10-CM | POA: Diagnosis not present

## 2021-08-31 DIAGNOSIS — I509 Heart failure, unspecified: Secondary | ICD-10-CM | POA: Diagnosis not present

## 2021-08-31 DIAGNOSIS — R339 Retention of urine, unspecified: Secondary | ICD-10-CM | POA: Diagnosis not present

## 2021-08-31 DIAGNOSIS — I4891 Unspecified atrial fibrillation: Secondary | ICD-10-CM | POA: Diagnosis not present

## 2021-08-31 DIAGNOSIS — R2681 Unsteadiness on feet: Secondary | ICD-10-CM | POA: Diagnosis not present

## 2021-09-02 ENCOUNTER — Encounter: Payer: Self-pay | Admitting: Student

## 2021-09-02 ENCOUNTER — Emergency Department (HOSPITAL_COMMUNITY): Payer: Medicare Other

## 2021-09-02 ENCOUNTER — Inpatient Hospital Stay (HOSPITAL_COMMUNITY)
Admission: EM | Admit: 2021-09-02 | Discharge: 2021-09-16 | DRG: 291 | Disposition: A | Discharge status: Skilled Nursing Facility | Payer: Medicare Other | Source: Skilled Nursing Facility | Attending: Family Medicine | Admitting: Family Medicine

## 2021-09-02 ENCOUNTER — Other Ambulatory Visit: Payer: Self-pay

## 2021-09-02 ENCOUNTER — Ambulatory Visit: Payer: Medicare Other | Admitting: Student

## 2021-09-02 ENCOUNTER — Encounter (HOSPITAL_COMMUNITY): Payer: Self-pay | Admitting: Emergency Medicine

## 2021-09-02 VITALS — BP 130/78 | HR 100 | Ht 65.5 in | Wt 169.0 lb

## 2021-09-02 DIAGNOSIS — I71012 Dissection of descending thoracic aorta: Secondary | ICD-10-CM

## 2021-09-02 DIAGNOSIS — I7121 Aneurysm of the ascending aorta, without rupture: Secondary | ICD-10-CM | POA: Diagnosis not present

## 2021-09-02 DIAGNOSIS — S72142D Displaced intertrochanteric fracture of left femur, subsequent encounter for closed fracture with routine healing: Secondary | ICD-10-CM | POA: Diagnosis not present

## 2021-09-02 DIAGNOSIS — I509 Heart failure, unspecified: Secondary | ICD-10-CM

## 2021-09-02 DIAGNOSIS — J811 Chronic pulmonary edema: Secondary | ICD-10-CM | POA: Diagnosis not present

## 2021-09-02 DIAGNOSIS — E1140 Type 2 diabetes mellitus with diabetic neuropathy, unspecified: Secondary | ICD-10-CM | POA: Diagnosis present

## 2021-09-02 DIAGNOSIS — I5043 Acute on chronic combined systolic (congestive) and diastolic (congestive) heart failure: Secondary | ICD-10-CM | POA: Diagnosis not present

## 2021-09-02 DIAGNOSIS — Z807 Family history of other malignant neoplasms of lymphoid, hematopoietic and related tissues: Secondary | ICD-10-CM | POA: Diagnosis not present

## 2021-09-02 DIAGNOSIS — I110 Hypertensive heart disease with heart failure: Principal | ICD-10-CM | POA: Diagnosis present

## 2021-09-02 DIAGNOSIS — Z7401 Bed confinement status: Secondary | ICD-10-CM | POA: Diagnosis not present

## 2021-09-02 DIAGNOSIS — Z20822 Contact with and (suspected) exposure to covid-19: Secondary | ICD-10-CM | POA: Diagnosis present

## 2021-09-02 DIAGNOSIS — I472 Ventricular tachycardia, unspecified: Secondary | ICD-10-CM | POA: Diagnosis not present

## 2021-09-02 DIAGNOSIS — I5033 Acute on chronic diastolic (congestive) heart failure: Secondary | ICD-10-CM | POA: Diagnosis present

## 2021-09-02 DIAGNOSIS — M6259 Muscle wasting and atrophy, not elsewhere classified, multiple sites: Secondary | ICD-10-CM | POA: Diagnosis not present

## 2021-09-02 DIAGNOSIS — Z853 Personal history of malignant neoplasm of breast: Secondary | ICD-10-CM | POA: Diagnosis not present

## 2021-09-02 DIAGNOSIS — M1990 Unspecified osteoarthritis, unspecified site: Secondary | ICD-10-CM | POA: Diagnosis present

## 2021-09-02 DIAGNOSIS — D638 Anemia in other chronic diseases classified elsewhere: Secondary | ICD-10-CM | POA: Diagnosis not present

## 2021-09-02 DIAGNOSIS — R601 Generalized edema: Secondary | ICD-10-CM | POA: Diagnosis not present

## 2021-09-02 DIAGNOSIS — Z90722 Acquired absence of ovaries, bilateral: Secondary | ICD-10-CM

## 2021-09-02 DIAGNOSIS — E876 Hypokalemia: Secondary | ICD-10-CM | POA: Diagnosis not present

## 2021-09-02 DIAGNOSIS — J90 Pleural effusion, not elsewhere classified: Secondary | ICD-10-CM | POA: Diagnosis not present

## 2021-09-02 DIAGNOSIS — I517 Cardiomegaly: Secondary | ICD-10-CM | POA: Diagnosis not present

## 2021-09-02 DIAGNOSIS — Z66 Do not resuscitate: Secondary | ICD-10-CM | POA: Diagnosis present

## 2021-09-02 DIAGNOSIS — R2689 Other abnormalities of gait and mobility: Secondary | ICD-10-CM | POA: Diagnosis not present

## 2021-09-02 DIAGNOSIS — E118 Type 2 diabetes mellitus with unspecified complications: Secondary | ICD-10-CM

## 2021-09-02 DIAGNOSIS — E785 Hyperlipidemia, unspecified: Secondary | ICD-10-CM | POA: Diagnosis present

## 2021-09-02 DIAGNOSIS — I358 Other nonrheumatic aortic valve disorders: Secondary | ICD-10-CM | POA: Diagnosis present

## 2021-09-02 DIAGNOSIS — Z833 Family history of diabetes mellitus: Secondary | ICD-10-CM | POA: Diagnosis not present

## 2021-09-02 DIAGNOSIS — Z7984 Long term (current) use of oral hypoglycemic drugs: Secondary | ICD-10-CM | POA: Diagnosis not present

## 2021-09-02 DIAGNOSIS — R498 Other voice and resonance disorders: Secondary | ICD-10-CM | POA: Diagnosis not present

## 2021-09-02 DIAGNOSIS — Z7901 Long term (current) use of anticoagulants: Secondary | ICD-10-CM | POA: Diagnosis not present

## 2021-09-02 DIAGNOSIS — E1165 Type 2 diabetes mellitus with hyperglycemia: Secondary | ICD-10-CM

## 2021-09-02 DIAGNOSIS — J929 Pleural plaque without asbestos: Secondary | ICD-10-CM | POA: Diagnosis not present

## 2021-09-02 DIAGNOSIS — I482 Chronic atrial fibrillation, unspecified: Secondary | ICD-10-CM | POA: Diagnosis not present

## 2021-09-02 DIAGNOSIS — R079 Chest pain, unspecified: Secondary | ICD-10-CM | POA: Diagnosis not present

## 2021-09-02 DIAGNOSIS — E119 Type 2 diabetes mellitus without complications: Secondary | ICD-10-CM | POA: Diagnosis not present

## 2021-09-02 DIAGNOSIS — J9811 Atelectasis: Secondary | ICD-10-CM | POA: Diagnosis not present

## 2021-09-02 DIAGNOSIS — I252 Old myocardial infarction: Secondary | ICD-10-CM

## 2021-09-02 DIAGNOSIS — J9601 Acute respiratory failure with hypoxia: Secondary | ICD-10-CM | POA: Diagnosis not present

## 2021-09-02 DIAGNOSIS — I4891 Unspecified atrial fibrillation: Secondary | ICD-10-CM | POA: Diagnosis not present

## 2021-09-02 DIAGNOSIS — E873 Alkalosis: Secondary | ICD-10-CM | POA: Diagnosis not present

## 2021-09-02 DIAGNOSIS — I712 Thoracic aortic aneurysm, without rupture, unspecified: Secondary | ICD-10-CM | POA: Diagnosis not present

## 2021-09-02 DIAGNOSIS — Z8249 Family history of ischemic heart disease and other diseases of the circulatory system: Secondary | ICD-10-CM

## 2021-09-02 DIAGNOSIS — M4184 Other forms of scoliosis, thoracic region: Secondary | ICD-10-CM | POA: Diagnosis not present

## 2021-09-02 DIAGNOSIS — I10 Essential (primary) hypertension: Secondary | ICD-10-CM | POA: Diagnosis not present

## 2021-09-02 DIAGNOSIS — M810 Age-related osteoporosis without current pathological fracture: Secondary | ICD-10-CM | POA: Diagnosis present

## 2021-09-02 DIAGNOSIS — R531 Weakness: Secondary | ICD-10-CM | POA: Diagnosis not present

## 2021-09-02 DIAGNOSIS — R0602 Shortness of breath: Secondary | ICD-10-CM | POA: Diagnosis not present

## 2021-09-02 DIAGNOSIS — R2681 Unsteadiness on feet: Secondary | ICD-10-CM | POA: Diagnosis not present

## 2021-09-02 DIAGNOSIS — R609 Edema, unspecified: Secondary | ICD-10-CM | POA: Diagnosis not present

## 2021-09-02 DIAGNOSIS — R278 Other lack of coordination: Secondary | ICD-10-CM | POA: Diagnosis not present

## 2021-09-02 DIAGNOSIS — R339 Retention of urine, unspecified: Secondary | ICD-10-CM | POA: Diagnosis not present

## 2021-09-02 DIAGNOSIS — Z9012 Acquired absence of left breast and nipple: Secondary | ICD-10-CM

## 2021-09-02 DIAGNOSIS — E1142 Type 2 diabetes mellitus with diabetic polyneuropathy: Secondary | ICD-10-CM | POA: Diagnosis not present

## 2021-09-02 DIAGNOSIS — J9690 Respiratory failure, unspecified, unspecified whether with hypoxia or hypercapnia: Secondary | ICD-10-CM | POA: Diagnosis not present

## 2021-09-02 DIAGNOSIS — Z96611 Presence of right artificial shoulder joint: Secondary | ICD-10-CM

## 2021-09-02 DIAGNOSIS — I5030 Unspecified diastolic (congestive) heart failure: Secondary | ICD-10-CM | POA: Diagnosis not present

## 2021-09-02 DIAGNOSIS — I471 Supraventricular tachycardia: Secondary | ICD-10-CM | POA: Diagnosis not present

## 2021-09-02 DIAGNOSIS — R262 Difficulty in walking, not elsewhere classified: Secondary | ICD-10-CM | POA: Diagnosis not present

## 2021-09-02 DIAGNOSIS — S72009A Fracture of unspecified part of neck of unspecified femur, initial encounter for closed fracture: Secondary | ICD-10-CM | POA: Diagnosis present

## 2021-09-02 DIAGNOSIS — M7989 Other specified soft tissue disorders: Secondary | ICD-10-CM | POA: Diagnosis not present

## 2021-09-02 DIAGNOSIS — Z79899 Other long term (current) drug therapy: Secondary | ICD-10-CM | POA: Diagnosis not present

## 2021-09-02 DIAGNOSIS — I455 Other specified heart block: Secondary | ICD-10-CM | POA: Diagnosis not present

## 2021-09-02 DIAGNOSIS — Z794 Long term (current) use of insulin: Secondary | ICD-10-CM

## 2021-09-02 DIAGNOSIS — R296 Repeated falls: Secondary | ICD-10-CM | POA: Diagnosis present

## 2021-09-02 DIAGNOSIS — I2510 Atherosclerotic heart disease of native coronary artery without angina pectoris: Secondary | ICD-10-CM | POA: Diagnosis not present

## 2021-09-02 DIAGNOSIS — J479 Bronchiectasis, uncomplicated: Secondary | ICD-10-CM | POA: Diagnosis not present

## 2021-09-02 DIAGNOSIS — Z90710 Acquired absence of both cervix and uterus: Secondary | ICD-10-CM

## 2021-09-02 DIAGNOSIS — M6281 Muscle weakness (generalized): Secondary | ICD-10-CM | POA: Diagnosis not present

## 2021-09-02 LAB — COMPREHENSIVE METABOLIC PANEL
ALT: 7 U/L (ref 0–44)
AST: 18 U/L (ref 15–41)
Albumin: 3.6 g/dL (ref 3.5–5.0)
Alkaline Phosphatase: 169 U/L — ABNORMAL HIGH (ref 38–126)
Anion gap: 7 (ref 5–15)
BUN: 18 mg/dL (ref 8–23)
CO2: 37 mmol/L — ABNORMAL HIGH (ref 22–32)
Calcium: 9.4 mg/dL (ref 8.9–10.3)
Chloride: 93 mmol/L — ABNORMAL LOW (ref 98–111)
Creatinine, Ser: 0.44 mg/dL (ref 0.44–1.00)
GFR, Estimated: >60 mL/min (ref >60)
Glucose, Bld: 144 mg/dL — ABNORMAL HIGH (ref 70–99)
Potassium: 4.2 mmol/L (ref 3.5–5.1)
Sodium: 137 mmol/L (ref 135–145)
Total Bilirubin: 0.5 mg/dL (ref 0.3–1.2)
Total Protein: 6.7 g/dL (ref 6.5–8.1)

## 2021-09-02 LAB — CBC WITH DIFFERENTIAL/PLATELET
Abs Immature Granulocytes: 0.05 10*3/uL (ref 0.00–0.07)
Basophils Absolute: 0.1 10*3/uL (ref 0.0–0.1)
Basophils Relative: 1 %
Eosinophils Absolute: 0.2 10*3/uL (ref 0.0–0.5)
Eosinophils Relative: 3 %
HCT: 33.5 % — ABNORMAL LOW (ref 36.0–46.0)
Hemoglobin: 10 g/dL — ABNORMAL LOW (ref 12.0–15.0)
Immature Granulocytes: 1 %
Lymphocytes Relative: 13 %
Lymphs Abs: 0.8 10*3/uL (ref 0.7–4.0)
MCH: 29.7 pg (ref 26.0–34.0)
MCHC: 29.9 g/dL — ABNORMAL LOW (ref 30.0–36.0)
MCV: 99.4 fL (ref 80.0–100.0)
Monocytes Absolute: 0.6 10*3/uL (ref 0.1–1.0)
Monocytes Relative: 9 %
Neutro Abs: 4.8 10*3/uL (ref 1.7–7.7)
Neutrophils Relative %: 73 %
Platelets: 280 10*3/uL (ref 150–400)
RBC: 3.37 MIL/uL — ABNORMAL LOW (ref 3.87–5.11)
RDW: 17.2 % — ABNORMAL HIGH (ref 11.5–15.5)
WBC: 6.5 10*3/uL (ref 4.0–10.5)
nRBC: 0 % (ref 0.0–0.2)

## 2021-09-02 LAB — CBG MONITORING, ED: Glucose-Capillary: 241 mg/dL — ABNORMAL HIGH (ref 70–99)

## 2021-09-02 LAB — GLUCOSE, CAPILLARY: Glucose-Capillary: 186 mg/dL — ABNORMAL HIGH (ref 70–99)

## 2021-09-02 LAB — TROPONIN I (HIGH SENSITIVITY)
Troponin I (High Sensitivity): 9 ng/L (ref <18)
Troponin I (High Sensitivity): 9 ng/L (ref <18)

## 2021-09-02 LAB — BRAIN NATRIURETIC PEPTIDE: B Natriuretic Peptide: 495.4 pg/mL — ABNORMAL HIGH (ref 0.0–100.0)

## 2021-09-02 MED ORDER — SENNA 8.6 MG PO TABS
1.0000 | ORAL_TABLET | Freq: Two times a day (BID) | ORAL | Status: DC
  Administered 2021-09-02 – 2021-09-16 (×26): 8.6 mg via ORAL
  Filled 2021-09-02 (×28): qty 1

## 2021-09-02 MED ORDER — DIGOXIN 62.5 MCG PO TABS: 0.0625 mg | ORAL_TABLET | Freq: Every day | ORAL | Status: DC

## 2021-09-02 MED ORDER — METOPROLOL SUCCINATE ER 100 MG PO TB24
100.0000 mg | ORAL_TABLET | Freq: Two times a day (BID) | ORAL | Status: DC
  Administered 2021-09-02 – 2021-09-09 (×14): 100 mg via ORAL
  Filled 2021-09-02 (×14): qty 1

## 2021-09-02 MED ORDER — FUROSEMIDE 10 MG/ML IJ SOLN
40.0000 mg | Freq: Two times a day (BID) | INTRAMUSCULAR | Status: DC
  Administered 2021-09-03 – 2021-09-04 (×3): 40 mg via INTRAVENOUS
  Filled 2021-09-02 (×3): qty 4

## 2021-09-02 MED ORDER — TRAZODONE HCL 50 MG PO TABS
50.0000 mg | ORAL_TABLET | Freq: Every evening | ORAL | Status: DC | PRN
  Administered 2021-09-02 – 2021-09-14 (×9): 50 mg via ORAL
  Filled 2021-09-02 (×9): qty 1

## 2021-09-02 MED ORDER — FUROSEMIDE 10 MG/ML IJ SOLN: 40.0000 mg | Freq: Two times a day (BID) | INTRAMUSCULAR | Status: DC

## 2021-09-02 MED ORDER — OXYCODONE HCL 5 MG PO TABS
5.0000 mg | ORAL_TABLET | ORAL | Status: DC | PRN
  Administered 2021-09-02 – 2021-09-08 (×3): 5 mg via ORAL
  Filled 2021-09-02 (×3): qty 1

## 2021-09-02 MED ORDER — ALBUTEROL SULFATE HFA 108 (90 BASE) MCG/ACT IN AERS: 2.0000 | INHALATION_SPRAY | RESPIRATORY_TRACT | Status: DC | PRN

## 2021-09-02 MED ORDER — GUAIFENESIN 100 MG/5ML PO LIQD: 5.0000 mL | ORAL | Status: DC | PRN

## 2021-09-02 MED ORDER — METOPROLOL TARTRATE 5 MG/5ML IV SOLN: 5.0000 mg | INTRAVENOUS | Status: DC | PRN

## 2021-09-02 MED ORDER — DIGOXIN 0.0625 MG HALF TABLET
0.0625 mg | ORAL_TABLET | Freq: Every day | ORAL | Status: DC
  Administered 2021-09-03 – 2021-09-09 (×8): 0.0625 mg via ORAL
  Filled 2021-09-02 (×2): qty 0.5
  Filled 2021-09-02: qty 1
  Filled 2021-09-02: qty 0.5
  Filled 2021-09-02 (×2): qty 1
  Filled 2021-09-02 (×2): qty 0.5

## 2021-09-02 MED ORDER — DM-GUAIFENESIN ER 30-600 MG PO TB12
1.0000 | ORAL_TABLET | Freq: Two times a day (BID) | ORAL | Status: DC | PRN
  Administered 2021-09-07: 1 via ORAL
  Filled 2021-09-02: qty 1

## 2021-09-02 MED ORDER — FUROSEMIDE 10 MG/ML IJ SOLN
60.0000 mg | Freq: Once | INTRAMUSCULAR | Status: AC
Start: 2021-09-02 — End: 2021-09-02
  Administered 2021-09-02: 60 mg via INTRAVENOUS
  Filled 2021-09-02: qty 8

## 2021-09-02 MED ORDER — ALBUTEROL SULFATE (2.5 MG/3ML) 0.083% IN NEBU
2.5000 mg | INHALATION_SOLUTION | RESPIRATORY_TRACT | Status: DC | PRN
  Administered 2021-09-03 – 2021-09-14 (×3): 2.5 mg via RESPIRATORY_TRACT
  Filled 2021-09-02 (×3): qty 3

## 2021-09-02 MED ORDER — IPRATROPIUM-ALBUTEROL 0.5-2.5 (3) MG/3ML IN SOLN
3.0000 mL | RESPIRATORY_TRACT | Status: DC | PRN
  Administered 2021-09-02 – 2021-09-08 (×3): 3 mL via RESPIRATORY_TRACT
  Filled 2021-09-02 (×3): qty 3

## 2021-09-02 MED ORDER — HYDRALAZINE HCL 20 MG/ML IJ SOLN: 10.0000 mg | INTRAMUSCULAR | Status: DC | PRN

## 2021-09-02 MED ORDER — TAMSULOSIN HCL 0.4 MG PO CAPS
0.4000 mg | ORAL_CAPSULE | Freq: Every day | ORAL | Status: DC
  Administered 2021-09-02 – 2021-09-16 (×15): 0.4 mg via ORAL
  Filled 2021-09-02 (×15): qty 1

## 2021-09-02 MED ORDER — INSULIN ASPART 100 UNIT/ML IJ SOLN
0.0000 [IU] | Freq: Three times a day (TID) | INTRAMUSCULAR | Status: DC
  Administered 2021-09-02 – 2021-09-03 (×3): 3 [IU] via SUBCUTANEOUS
  Administered 2021-09-03: 1 [IU] via SUBCUTANEOUS
  Administered 2021-09-04 (×2): 2 [IU] via SUBCUTANEOUS
  Administered 2021-09-04 – 2021-09-05 (×2): 1 [IU] via SUBCUTANEOUS
  Administered 2021-09-05: 2 [IU] via SUBCUTANEOUS
  Administered 2021-09-06: 3 [IU] via SUBCUTANEOUS
  Administered 2021-09-06: 1 [IU] via SUBCUTANEOUS
  Administered 2021-09-07: 3 [IU] via SUBCUTANEOUS
  Administered 2021-09-07: 2 [IU] via SUBCUTANEOUS
  Administered 2021-09-07: 5 [IU] via SUBCUTANEOUS
  Administered 2021-09-08: 2 [IU] via SUBCUTANEOUS
  Administered 2021-09-08: 1 [IU] via SUBCUTANEOUS
  Administered 2021-09-08: 3 [IU] via SUBCUTANEOUS
  Administered 2021-09-09: 1 [IU] via SUBCUTANEOUS
  Administered 2021-09-09: 5 [IU] via SUBCUTANEOUS
  Administered 2021-09-09: 2 [IU] via SUBCUTANEOUS
  Administered 2021-09-10: 1 [IU] via SUBCUTANEOUS
  Administered 2021-09-10: 3 [IU] via SUBCUTANEOUS
  Administered 2021-09-11 (×2): 2 [IU] via SUBCUTANEOUS
  Administered 2021-09-12: 1 [IU] via SUBCUTANEOUS
  Administered 2021-09-13: 2 [IU] via SUBCUTANEOUS
  Administered 2021-09-13 (×2): 1 [IU] via SUBCUTANEOUS
  Administered 2021-09-14: 2 [IU] via SUBCUTANEOUS
  Administered 2021-09-14: 1 [IU] via SUBCUTANEOUS
  Administered 2021-09-15 (×3): 2 [IU] via SUBCUTANEOUS
  Administered 2021-09-16: 1 [IU] via SUBCUTANEOUS
  Administered 2021-09-16: 2 [IU] via SUBCUTANEOUS
  Filled 2021-09-02: qty 0.09

## 2021-09-02 MED ORDER — SENNOSIDES-DOCUSATE SODIUM 8.6-50 MG PO TABS
1.0000 | ORAL_TABLET | Freq: Every evening | ORAL | Status: DC | PRN
  Administered 2021-09-06: 1 via ORAL
  Filled 2021-09-02: qty 1

## 2021-09-02 MED ORDER — RIVAROXABAN 15 MG PO TABS
15.0000 mg | ORAL_TABLET | Freq: Every day | ORAL | Status: DC
  Administered 2021-09-02 – 2021-09-15 (×14): 15 mg via ORAL
  Filled 2021-09-02 (×14): qty 1

## 2021-09-02 MED ORDER — DILTIAZEM HCL ER COATED BEADS 180 MG PO CP24
360.0000 mg | ORAL_CAPSULE | Freq: Every day | ORAL | Status: DC
Start: 2021-09-02 — End: 2021-09-16
  Administered 2021-09-02 – 2021-09-16 (×15): 360 mg via ORAL
  Filled 2021-09-02 (×15): qty 2

## 2021-09-02 MED ORDER — POTASSIUM CHLORIDE CRYS ER 10 MEQ PO TBCR
10.0000 meq | EXTENDED_RELEASE_TABLET | Freq: Every day | ORAL | Status: DC
  Administered 2021-09-02 – 2021-09-07 (×6): 10 meq via ORAL
  Filled 2021-09-02 (×8): qty 1

## 2021-09-02 MED ORDER — GABAPENTIN 100 MG PO CAPS
100.0000 mg | ORAL_CAPSULE | Freq: Three times a day (TID) | ORAL | Status: DC
Start: 2021-09-02 — End: 2021-09-16
  Administered 2021-09-02 – 2021-09-16 (×37): 100 mg via ORAL
  Filled 2021-09-02 (×39): qty 1

## 2021-09-02 NOTE — ED Provider Notes (Signed)
?[facility] DEPT ?Provider Note ? ? ?CSN: [id number] ?Arrival date & time: 09/02/21  1240 ? ?  ? ?History ? ?Chief Complaint  ?Patient presents with  ? Shortness of Breath  ? ? ?Tara Bradley is a 88 y.o. female. ? ?88 year old female with prior medical history as detailed below presents for evaluation.  Patient with prior medical history includes chronic atrial fibrillation on Xarelto, chronic diastolic CHF, aortic aneurysm with chronic descending dissection, aortic valve sclerosis, hypertension, hyperlipidemia, type 2 diabetes mellitus.  ? ?Patient reports that she feels like she is fluid overloaded.  She takes 60 of Lasix orally every day.  40 mg are taken in the morning 20 mg taken at night.  She uses O2 as necessary  -she is currently comfortable on 4 L nasal cannula. ? ?She was seen by cardiology in the office today.  She was sent to the ED for evaluation and IV diuresis. ? ?The history is provided by the patient and medical records.  ?Shortness of Breath ?Severity:  Moderate ?Onset quality:  Gradual ?Duration:  2 weeks ?Timing:  Constant ?Progression:  Worsening ?Chronicity:  Recurrent ? ?  ? ?Home Medications ?Prior to Admission medications   ?Medication Sig Start Date End Date Taking? Authorizing Provider  ?acetaminophen (TYLENOL) 500 MG tablet Take 2 tablets (1,000 mg total) by mouth every 8 (eight) hours as needed. 08/04/21 09/03/21  Willaim Sheng, MD  ?BD PEN NEEDLE NANO U/F 32G X 4 MM MISC Inject into the skin daily. 11/25/20   [provider]  ?Calcium Carbonate-Vitamin D 600-400 MG-UNIT tablet Take 1 tablet by mouth daily.    [provider]  ?Cholecalciferol (VITAMIN D) 2000 UNITS tablet Take 1,000 Units by mouth daily.    [provider]  ?digoxin 62.5 MCG TABS Take 0.0625 mg by mouth daily. 08/07/21   Shelly Coss, MD  ?diltiazem (CARDIZEM CD) 360 MG 24 hr capsule Take 1 capsule (360 mg total) by mouth daily. 05/12/21   Shelly Coss, MD  ?furosemide (LASIX) 40 MG tablet TAKE ONE TABLET BY MOUTH DAILY ?Patient taking differently: Take 40 mg by mouth daily. 07/29/21   [name], MD  ?gabapentin (NEURONTIN) 100 MG capsule Take 100 mg by mouth 3 (three) times daily. 08/14/21   [provider]  ?Insulin Lispro Prot & Lispro (HUMALOG MIX 50/50 KWIKPEN) (50-50) 100 UNIT/ML Kwikpen Inject 4-5 Units into the skin in the morning and at bedtime. Take 5 units in the morning after breakfast and 4 units at night after supper.    [provider]  ?Magnesium Oxide 250 MG TABS Take 250 mg by mouth daily.    [provider]  ?metFORMIN (GLUMETZA) 500 MG (MOD) 24 hr tablet Take 1,000 mg by mouth every evening.    [provider]  ?metoprolol succinate (TOPROL-XL) 100 MG 24 hr tablet TAKE ONE TABLET BY MOUTH TWICE DAILY. TAKE WITH OR IMMEDIATELY FOLLOWING A MEAL ?Patient taking differently: Take 100 mg by mouth in the morning and at bedtime. 07/29/21   [name], MD  ?metoprolol succinate (TOPROL-XL) 25 MG 24 hr tablet Take 25 mg by mouth 2 (two) times daily as needed.    [provider]  ?multivitamin-lutein (OCUVITE-LUTEIN) CAPS capsule Take 1 capsule by mouth daily.    [provider]  ?polyethylene glycol (MIRALAX / GLYCOLAX) 17 g packet Take 17 g by mouth daily. ?Patient taking differently: Take 17 g by mouth daily as needed for mild constipation. 05/12/21   Adhikari,  Amrit, MD  ?potassium chloride (K-DUR) 10 MEQ tablet Take 10 mEq by mouth daily.  03/25/15   [provider]  ?senna (SENOKOT) 8.6 MG TABS tablet Take 1 tablet (8.6 mg total) by mouth 2 (two) times daily. 08/07/21   Shelly Coss, MD  ?tamsulosin (FLOMAX) 0.4 MG CAPS capsule Take 1 capsule (0.4 mg total) by mouth daily. 08/08/21   Shelly Coss, MD  ?Alveda Reasons 15 MG TABS tablet TAKE 1 TABLET ONCE DAILY WITH SUPPER. ?Patient taking differently: Take 15 mg by mouth daily with supper. 06/22/21   [name], MD  ?    ? ?Allergies    ?Ambien [zolpidem], Codeine, Codeine sulfate, Dulaglutide, Latex, Morphine sulfate, and Mirtazapine   ? ?Review of Systems   ?Review of Systems  ?Respiratory:  Positive for shortness of breath.   ?All other systems reviewed and are negative. ? ?Physical Exam ?Updated Vital Signs ?BP 127/84   Pulse 69   Temp 98.1 ?F (36.7 ?C) (Oral)   Resp (!) 24   Ht 5' 5.5" (1.664 m)   Wt 75.8 kg   SpO2 93%   BMI 27.37 kg/m?  ?Physical Exam ?Vitals and nursing note reviewed.  ?Constitutional:   ?   General: She is not in acute distress. ?   Appearance: Normal appearance. She is well-developed.  ?HENT:  ?   Head: Normocephalic and atraumatic.  ?Eyes:  ?   Conjunctiva/sclera: Conjunctivae normal.  ?   Pupils: Pupils are equal, round, and reactive to light.  ?Cardiovascular:  ?   Rate and Rhythm: Normal rate and regular rhythm.  ?   Heart sounds: Normal heart sounds.  ?Pulmonary:  ?   Effort: Pulmonary effort is normal. No respiratory distress.  ?   Breath sounds: Examination of the right-lower field reveals decreased breath sounds. Examination of the left-lower field reveals decreased breath sounds. Decreased breath sounds present.  ?Abdominal:  ?   General: There is no distension.  ?   Palpations: Abdomen is soft.  ?   Tenderness: There is no abdominal tenderness.  ?Musculoskeletal:     ?   General: No deformity. Normal range of motion.  ?   Cervical back: Normal range of motion and neck supple.  ?   Right lower leg: Edema present.  ?   Left lower leg: Edema present.  ?Skin: ?   General: Skin is warm and dry.  ?Neurological:  ?   General: No focal deficit present.  ?   Mental Status: She is alert and oriented to person, place, and time.  ? ? ?ED Results / Procedures / Treatments   ?Labs ?(all labs ordered are listed, but only abnormal results are displayed) ?Labs Reviewed  ?CBC WITH DIFFERENTIAL/PLATELET - Abnormal; Notable for the following components:  ?    Result Value  ? RBC 3.37 (*)   ? Hemoglobin 10.0  (*)   ? HCT 33.5 (*)   ? MCHC 29.9 (*)   ? RDW 17.2 (*)   ? All other components within normal limits  ?COMPREHENSIVE METABOLIC PANEL - Abnormal; Notable for the following components:  ? Chloride 93 (*)   ? CO2 37 (*)   ? Glucose, Bld 144 (*)   ? Alkaline Phosphatase 169 (*)   ? All other components within normal limits  ?BRAIN NATRIURETIC PEPTIDE - Abnormal; Notable for the following components:  ? B Natriuretic Peptide 495.4 (*)   ? All other components within normal limits  ?TROPONIN I (HIGH SENSITIVITY)  ?TROPONIN I (HIGH SENSITIVITY)  ? ? ?  EKG ?EKG Interpretation ? ?Date/Time:  Wednesday September 02 2021 12:52:42 EDT ?Ventricular Rate:  84 ?PR Interval:    ?QRS Duration: 151 ?QT Interval:  336 ?QTC Calculation: 386 ?R Axis:   266 ?Text Interpretation: Atrial fibrillation IVCD, consider atypical RBBB Probable anterolateral infarct, age indeterm Abnormal T, consider ischemia, lateral leads Artifact in lead(s) I III aVR aVL aVF Confirmed by Dene Gentry [phone]) on 09/02/2021 2:12:09 PM ? ?Radiology ?DG Chest 2 View ? ?Result Date: 09/02/2021 ?CLINICAL DATA:  Shortness of breath, increased abdominal and leg swelling, history pneumonia, CHF, diabetes mellitus, hypertension EXAM: CHEST - 2 VIEW COMPARISON:  08/27/2021 FINDINGS: Normal heart size for AP technique and scoliosis. Prominent central pulmonary arteries. Atherosclerotic calcification thoracic aorta. Bibasilar effusions and atelectasis. Minimal pulmonary edema, improved. No pneumothorax. Dextroconvex thoracic scoliosis and osseous demineralization with note of a RIGHT shoulder prosthesis. IMPRESSION: Slightly improved pulmonary edema. Small bibasilar pleural effusions and bibasilar atelectasis. Aortic Atherosclerosis (ICD10-I70.0). Electronically Signed   By: Lavonia Dana M.D.   On: 09/02/2021 13:30   ? ?Procedures ?Procedures  ? ? ?Medications Ordered in ED ?Medications  ?albuterol (VENTOLIN HFA) 108 (90 Base) MCG/ACT inhaler 2 puff (has no administration in  time range)  ? ? ?ED Course/ Medical Decision Making/ A&P ?  ?                        ?Medical Decision Making ?Amount and/or Complexity of Data Reviewed ?Radiology: ordered. ? ?Risk ?Prescription drug management. ? ? ? ?

## 2021-09-02 NOTE — H&P (Addendum)
History and Physical    Patient: Tara Bradley ZOX:[id number] DOB: 25-Mar-1923 DOA: 09/02/2021 DOS: the patient was seen and examined on 09/02/2021 PCP: Mila Palmer, MD  Patient coming from: ALF/ILF  Chief Complaint:  Chief Complaint  Patient presents with   Shortness of Breath   HPI: ADYSIN SPITALE is a 88 y.o. female with medical history significant of past medical history of chronic atrial fibrillation on Xarelto, diastolic CHF ef 55% g2dd, DM 2, HLD, HTN, osteoporosis, thoracic aortic aneurysm, DM 2 comes to the hospital from cardiology office for evaluation of shortness of breath.  Patient states over the past several weeks she has noted she has progressively become short of breath especially with exertion, some orthopnea and increasing lower extremity swelling.  She is also noted at least 20 pound weight gain over the last 7-8 weeks despite of taking her Lasix.  Today while at her cardiology office she was noted to be volume overloaded and visibly short of breath with some hypoxia therefore sent to the hospital for further management.  Patient has recently been admitted to the hospital couple of times for fall causing shoulder and leg fracture both times complicated by pneumonia and anemia requiring antibiotics and transfusion.  More recently she was in the ED for shortness of breath which was treated with IV Lasix and she was discharged home on oral antibiotics.  During the ED she is noted to have volume overload, requiring 2 L nasal cannula oxygen.  Her BNP is elevated, chest x-ray suggestive of pulmonary edema/effusion.  She does have 3+ bilateral lower extremity edema and visibly tachypneic when talking long sentences.  Cardiology team consulted, IV Lasix given.  Review of Systems:   General: Denies fever, chills, night sweats or unintended weight loss. Resp: Denies cough Cardiac: Denies chest pain, palpitations, orthopnea, paroxysmal nocturnal dyspnea. GI: Denies abdominal  pain, nausea, vomiting, diarrhea or constipation GU: Denies dysuria, frequency, hesitancy or incontinence MS: Denies muscle aches, joint pain or swelling Neuro: Denies headache, neurologic deficits (focal weakness, numbness, tingling), abnormal gait Psych: Denies anxiety, depression, SI/HI/AVH Skin: Denies new rashes or lesions ID: Denies sick contacts, exotic exposures, travel Past Medical History:  Diagnosis Date   Aortic valve sclerosis    Echo, 2008   Arthritis    Bradycardia    October, 2012   Cancer [facility])    Chronic atrial fibrillation (HCC)    Chronic diastolic CHF (congestive heart failure) (HCC)    Chronic insomnia    Closed fracture of unspecified part of femur 2005   Diabetes mellitus, type 2 (HCC)    Diverticulitis    Diverticulosis    Hyperlipidemia    Hypertension    Long term (current) use of anticoagulants    Osteopenia    Osteoporosis    femur fracture 2005, pelvic fracture 2006   Personal history of malignant neoplasm of breast    Pneumonia    Syncope    Thoracic aortic aneurysm (HCC) 02/2017   chronic descending aorta dissection   Unspecified closed fracture of pelvis 2006   Past Surgical History:  Procedure Laterality Date   CARDIOVERSION N/A 08/09/2012   Procedure: CARDIOVERSION;  Surgeon: Duke Salvia, MD;  Location: [facility] ENDOSCOPY;  Service: Cardiovascular;  Laterality: N/A;   EYE SURGERY     FEMUR SURGERY  2005   ORIF   INTRAMEDULLARY (IM) NAIL INTERTROCHANTERIC Left 08/02/2021   Procedure: INTRAMEDULLARY (IM) NAIL INTERTROCHANTRIC;  Surgeon: Joen Laura, MD;  [facility] OR;  Service: Orthopedics;  Laterality: Left;   IR ANGIOGRAM PELVIS SELECTIVE OR SUPRASELECTIVE  03/11/2017   IR ANGIOGRAM SELECTIVE EACH ADDITIONAL VESSEL  03/11/2017   IR EMBO ART  VEN HEMORR LYMPH EXTRAV  INC GUIDE ROADMAPPING  03/11/2017   IR FLUORO GUIDE CV LINE RIGHT  03/11/2017   IR US GUIDE VASC ACCESS RIGHT  03/11/2017   IR US GUIDE VASC ACCESS RIGHT   03/11/2017   MASTECTOMY  1995   left   MINOR HARDWARE REMOVAL Left 09/01/2020   Procedure: REMOVAL OF HARDWARE LEFT FOREARM;  Surgeon: Mack Hook, MD;  Location: [facility];  Service: Orthopedics;  Laterality: Left;   ORIF ULNAR FRACTURE Left 10/19/2019   Procedure: OPEN REDUCTION INTERNAL FIXATION (ORIF)  LEFT DISTAL RADIUS  AND ULNA FRACTURE;  Surgeon: Mack Hook, MD;  Location: [facility] OR;  Service: Orthopedics;  Laterality: Left;   REVERSE SHOULDER ARTHROPLASTY Right 05/04/2021   Procedure: REVERSE SHOULDER ARTHROPLASTY;  Surgeon: Beverely Low, MD;  Location: [facility] OR;  Service: Orthopedics;  Laterality: Right;   SHOULDER SURGERY     left   SHOULDER SURGERY  1979, 2003   TONSILLECTOMY     TOTAL ABDOMINAL HYSTERECTOMY W/ BILATERAL SALPINGOOPHORECTOMY  1995   WRIST SURGERY      x 2    Social History:  reports that she has never smoked. She has never used smokeless tobacco. She reports that she does not drink alcohol and does not use drugs.  Allergies  Allergen Reactions   Ambien [Zolpidem] Other (See Comments)    hallucinations   Codeine Other (See Comments)    headache    Codeine Sulfate Other (See Comments)    REACTION: unspecified   Dulaglutide Diarrhea   Latex Hives   Morphine Sulfate Nausea And Vomiting   Mirtazapine Palpitations    Family History  Problem Relation Age of Onset   Congestive Heart Failure Mother 50   Heart attack Father 4   Diabetes type II Father    Diabetes type II Brother        1/2   Multiple myeloma Brother        2/2   Diabetes type II Son    Colon cancer Neg Hx     Prior to Admission medications   Medication Sig Start Date End Date Taking? Authorizing Provider  acetaminophen (TYLENOL) 500 MG tablet Take 2 tablets (1,000 mg total) by mouth every 8 (eight) hours as needed. 08/04/21 09/03/21  Joen Laura, MD  BD PEN NEEDLE NANO U/F 32G X 4 MM MISC Inject into the skin daily. 11/25/20   [provider]   Calcium Carbonate-Vitamin D 600-400 MG-UNIT tablet Take 1 tablet by mouth daily.    [provider]  Cholecalciferol (VITAMIN D) 2000 UNITS tablet Take 1,000 Units by mouth daily.    [provider]  digoxin 62.5 MCG TABS Take 0.0625 mg by mouth daily. 08/07/21   Burnadette Pop, MD  diltiazem (CARDIZEM CD) 360 MG 24 hr capsule Take 1 capsule (360 mg total) by mouth daily. 05/12/21   Burnadette Pop, MD  furosemide (LASIX) 40 MG tablet TAKE ONE TABLET BY MOUTH DAILY Patient taking differently: Take 40 mg by mouth daily. 07/29/21   Rollene Rotunda, MD  gabapentin (NEURONTIN) 100 MG capsule Take 100 mg by mouth 3 (three) times daily. 08/14/21   [provider]  Insulin Lispro Prot & Lispro (HUMALOG MIX 50/50 KWIKPEN) (50-50) 100 UNIT/ML Kwikpen Inject 4-5 Units into the skin in the morning and at bedtime. Take  5 units in the morning after breakfast and 4 units at night after supper.    [provider]  Magnesium Oxide 250 MG TABS Take 250 mg by mouth daily.    [provider]  metFORMIN (GLUMETZA) 500 MG (MOD) 24 hr tablet Take 1,000 mg by mouth every evening.    [provider]  metoprolol succinate (TOPROL-XL) 100 MG 24 hr tablet TAKE ONE TABLET BY MOUTH TWICE DAILY. TAKE WITH OR IMMEDIATELY FOLLOWING A MEAL Patient taking differently: Take 100 mg by mouth in the morning and at bedtime. 07/29/21   Rollene Rotunda, MD  metoprolol succinate (TOPROL-XL) 25 MG 24 hr tablet Take 25 mg by mouth 2 (two) times daily as needed.    [provider]  multivitamin-lutein (OCUVITE-LUTEIN) CAPS capsule Take 1 capsule by mouth daily.    [provider]  polyethylene glycol (MIRALAX / GLYCOLAX) 17 g packet Take 17 g by mouth daily. Patient taking differently: Take 17 g by mouth daily as needed for mild constipation. 05/12/21   Burnadette Pop, MD  potassium chloride (K-DUR) 10 MEQ tablet Take 10 mEq by mouth daily.  03/25/15   [provider]   senna (SENOKOT) 8.6 MG TABS tablet Take 1 tablet (8.6 mg total) by mouth 2 (two) times daily. 08/07/21   Burnadette Pop, MD  tamsulosin (FLOMAX) 0.4 MG CAPS capsule Take 1 capsule (0.4 mg total) by mouth daily. 08/08/21   Burnadette Pop, MD  XARELTO 15 MG TABS tablet TAKE 1 TABLET ONCE DAILY WITH SUPPER. Patient taking differently: Take 15 mg by mouth daily with supper. 06/22/21   Rollene Rotunda, MD    Physical Exam: Vitals:   09/02/21 1254 09/02/21 1530  BP: 119/65 127/84  Pulse: 75 69  Resp: (!) 21 (!) 24  Temp: 98.1 F (36.7 C)   TempSrc: Oral   SpO2: 90% 93%  Weight: 75.8 kg   Height: 5' 5.5" (1.664 m)    Constitutional: Not in acute distress, 2 L nasal cannula Respiratory: Bilateral crackles.  Tachypnea Cardiovascular: Irregularly irregular, no rubs, 3+ bilateral lower extremity pitting edema Abdomen: Nontender nondistended good bowel sounds Musculoskeletal: No edema noted Skin: No rashes seen Neurologic: CN 2-12 grossly intact.  And nonfocal Psychiatric: Normal judgment and insight. Alert and oriented x 3. Normal mood. Data Reviewed: EKG shows atrial fibrillation Elevated BNP-495  Assessment and Plan:  Acute diastolic congestive heart failure, EF 55%, class IV. g2DD Acute respiratory distress with tachypnea saturating 90% on 2 L nasal cannula -Patient is not on any home oxygen.  Will start on Lasix 40 mg IV twice daily.  Cardiology team consulted.  Monitor electrolytes, replete as necessary.  Chronic atrial fibrillation - On home Cardizem, Toprol-XL twice daily and digoxin.  Continue Xarelto.  Diabetes mellitus type 2 - Holding home metformin.  Sliding scale and Accu-Cheks.  Peripheral neuropathy secondary to DM2 - Continue gabapentin  Anemia of chronic disease - Stable hemoglobin  Ascending aortic aneurysm - Stable previously seen by CT surgery.  No further follow-up needed  Essential hypertension - On Cardizem and Toprol-XL.  IV Lopressor and hydralazine  as needed added.  Hyperlipidemia - Not on statin  Recent hip and shoulder fracture - Status post intramedullary nailing on 08/02/2021.  Will need PT/OT as her breathing symptoms improve     Advance Care Planning:   Code Status: DNR   Consults: Cardiology consulted, will see the patient tomorrow morning  Family Communication: Spoke with patient's daughter in [place] over the phone, Leta Jungling  Severity of Illness: The appropriate patient status for this patient is OBSERVATION. Observation status is judged to be reasonable and necessary in order to provide the required intensity of service to ensure the patient's safety. The patient's presenting symptoms, physical exam findings, and initial radiographic and laboratory data in the context of their medical condition is felt to place them at decreased risk for further clinical deterioration. Furthermore, it is anticipated that the patient will be medically stable for discharge from the hospital within 2 midnights of admission.   Author: Dimple Nanas, MD 09/02/2021 3:57 PM  For on call review [email or web address].

## 2021-09-02 NOTE — ED Triage Notes (Signed)
Per pt, states she is at [facility] for rehab-states she saw cardiologist today and they wanted her to come to hospital to get some fluid off her heart and lungs-states she takes 60 mg of Lasix now but states it is now helping-states she has PNA as well-is on 3L [place] at home- ?

## 2021-09-02 NOTE — ED Provider Triage Note (Signed)
Emergency Medicine Provider Triage Evaluation Note ? ?Tara Bradley , a 88 y.o. female  was evaluated in triage.  Pt complains of shortness of breath. States that she went to her cardiologist earlier today who sent her here for IV diuresis.  States that she is on '60mg'$  of Lasix but still feels fluid overloaded.  States that her abdomen and legs are more swollen than normal.  Additionally, she states that she recently completed a course of antibiotics for pneumonia and has been on 3 L of oxygen at baseline since her pneumonia diagnosis.  She denies any fevers, chills, chest pain. ? ?Review of Systems  ?Positive:  ?Negative: See above ? ?Physical Exam  ?BP 119/65 (BP Location: Left Arm)   Pulse 75   Temp 98.1 ?F (36.7 ?C) (Oral)   Resp (!) 21   Ht 5' 5.5" (1.664 m)   Wt 75.8 kg   SpO2 90%   BMI 27.37 kg/m?  ?Gen:   Awake, no distress   ?Resp:  Normal effort, NAD on 3L [place]  ?MSK:   Moves extremities without difficulty  ?Other:  3+ pitting edema bilaterally ? ?Medical Decision Making  ?Medically screening exam initiated at 1:13 PM.  Appropriate orders placed.  Tara Bradley was informed that the remainder of the evaluation will be completed by another provider, this initial triage assessment does not replace that evaluation, and the importance of remaining in the ED until their evaluation is complete. ? ? ?  ?Bud Face, PA-C ?09/02/21 1316 ? ?

## 2021-09-02 NOTE — Patient Instructions (Signed)
Medication Instructions:  ?None  ?*If you need a refill on your cardiac medications before your next appointment, please call your pharmacy* ? ? ?Lab Work: ?None ?If you have labs (blood work) drawn today and your tests are completely normal, you will receive your results only by: ?MyChart Message (if you have MyChart) OR ?A paper copy in the mail ?If you have any lab test that is abnormal or we need to change your treatment, we will call you to review the results. ? ? ?Testing/Procedures: ?None ? ? ?Follow-Up: ?At [facility], you and your health needs are our priority.  As part of our continuing mission to provide you with exceptional heart care, we have created designated Provider Care Teams.  These Care Teams include your primary Cardiologist (physician) and Advanced Practice Providers (APPs -  Physician Assistants and Nurse Practitioners) who all work together to provide you with the care you need, when you need it. ? ?We recommend signing up for the patient portal called "MyChart".  Sign up information is provided on this After Visit Summary.  MyChart is used to connect with patients for Virtual Visits (Telemedicine).  Patients are able to view lab/test results, encounter notes, upcoming appointments, etc.  Non-urgent messages can be sent to your provider as well.   ?To learn more about what you can do with MyChart, go to [email or web address].   ? ?Your next appointment:   ?Keep Scheduled Appointment ? ?The format for your next appointment:   ?In Person ? ?Provider:   ?Sande Rives, PA-C    Then, [name], MD will plan to see you again in 3 month(s).  ? ? ?Other Instructions ?Go to [facility] Emergency Department. ? ?Important Information About Sugar ? ? ? ? ?  ?

## 2021-09-03 DIAGNOSIS — R079 Chest pain, unspecified: Secondary | ICD-10-CM | POA: Diagnosis not present

## 2021-09-03 DIAGNOSIS — I517 Cardiomegaly: Secondary | ICD-10-CM | POA: Diagnosis not present

## 2021-09-03 DIAGNOSIS — E1142 Type 2 diabetes mellitus with diabetic polyneuropathy: Secondary | ICD-10-CM | POA: Diagnosis not present

## 2021-09-03 DIAGNOSIS — I358 Other nonrheumatic aortic valve disorders: Secondary | ICD-10-CM | POA: Diagnosis present

## 2021-09-03 DIAGNOSIS — D638 Anemia in other chronic diseases classified elsewhere: Secondary | ICD-10-CM | POA: Diagnosis not present

## 2021-09-03 DIAGNOSIS — J90 Pleural effusion, not elsewhere classified: Secondary | ICD-10-CM | POA: Diagnosis not present

## 2021-09-03 DIAGNOSIS — I10 Essential (primary) hypertension: Secondary | ICD-10-CM | POA: Diagnosis not present

## 2021-09-03 DIAGNOSIS — Z807 Family history of other malignant neoplasms of lymphoid, hematopoietic and related tissues: Secondary | ICD-10-CM | POA: Diagnosis not present

## 2021-09-03 DIAGNOSIS — Z7901 Long term (current) use of anticoagulants: Secondary | ICD-10-CM | POA: Diagnosis not present

## 2021-09-03 DIAGNOSIS — M810 Age-related osteoporosis without current pathological fracture: Secondary | ICD-10-CM | POA: Diagnosis present

## 2021-09-03 DIAGNOSIS — I110 Hypertensive heart disease with heart failure: Secondary | ICD-10-CM | POA: Diagnosis not present

## 2021-09-03 DIAGNOSIS — E119 Type 2 diabetes mellitus without complications: Secondary | ICD-10-CM | POA: Diagnosis not present

## 2021-09-03 DIAGNOSIS — J9601 Acute respiratory failure with hypoxia: Secondary | ICD-10-CM | POA: Diagnosis present

## 2021-09-03 DIAGNOSIS — Z7984 Long term (current) use of oral hypoglycemic drugs: Secondary | ICD-10-CM | POA: Diagnosis not present

## 2021-09-03 DIAGNOSIS — I471 Supraventricular tachycardia: Secondary | ICD-10-CM | POA: Diagnosis not present

## 2021-09-03 DIAGNOSIS — J479 Bronchiectasis, uncomplicated: Secondary | ICD-10-CM | POA: Diagnosis not present

## 2021-09-03 DIAGNOSIS — Z853 Personal history of malignant neoplasm of breast: Secondary | ICD-10-CM | POA: Diagnosis not present

## 2021-09-03 DIAGNOSIS — E785 Hyperlipidemia, unspecified: Secondary | ICD-10-CM | POA: Diagnosis present

## 2021-09-03 DIAGNOSIS — Z20822 Contact with and (suspected) exposure to covid-19: Secondary | ICD-10-CM | POA: Diagnosis not present

## 2021-09-03 DIAGNOSIS — R0602 Shortness of breath: Secondary | ICD-10-CM | POA: Diagnosis present

## 2021-09-03 DIAGNOSIS — I482 Chronic atrial fibrillation, unspecified: Secondary | ICD-10-CM | POA: Diagnosis present

## 2021-09-03 DIAGNOSIS — J929 Pleural plaque without asbestos: Secondary | ICD-10-CM | POA: Diagnosis not present

## 2021-09-03 DIAGNOSIS — Z8249 Family history of ischemic heart disease and other diseases of the circulatory system: Secondary | ICD-10-CM | POA: Diagnosis not present

## 2021-09-03 DIAGNOSIS — I5033 Acute on chronic diastolic (congestive) heart failure: Secondary | ICD-10-CM | POA: Diagnosis present

## 2021-09-03 DIAGNOSIS — I712 Thoracic aortic aneurysm, without rupture, unspecified: Secondary | ICD-10-CM | POA: Diagnosis not present

## 2021-09-03 DIAGNOSIS — E1165 Type 2 diabetes mellitus with hyperglycemia: Secondary | ICD-10-CM | POA: Diagnosis present

## 2021-09-03 DIAGNOSIS — Z66 Do not resuscitate: Secondary | ICD-10-CM | POA: Diagnosis not present

## 2021-09-03 DIAGNOSIS — M4184 Other forms of scoliosis, thoracic region: Secondary | ICD-10-CM | POA: Diagnosis not present

## 2021-09-03 DIAGNOSIS — J811 Chronic pulmonary edema: Secondary | ICD-10-CM | POA: Diagnosis not present

## 2021-09-03 DIAGNOSIS — Z79899 Other long term (current) drug therapy: Secondary | ICD-10-CM | POA: Diagnosis not present

## 2021-09-03 DIAGNOSIS — I2510 Atherosclerotic heart disease of native coronary artery without angina pectoris: Secondary | ICD-10-CM | POA: Diagnosis not present

## 2021-09-03 DIAGNOSIS — E873 Alkalosis: Secondary | ICD-10-CM | POA: Diagnosis present

## 2021-09-03 DIAGNOSIS — Z794 Long term (current) use of insulin: Secondary | ICD-10-CM | POA: Diagnosis not present

## 2021-09-03 DIAGNOSIS — I472 Ventricular tachycardia, unspecified: Secondary | ICD-10-CM | POA: Diagnosis not present

## 2021-09-03 DIAGNOSIS — Z833 Family history of diabetes mellitus: Secondary | ICD-10-CM | POA: Diagnosis not present

## 2021-09-03 DIAGNOSIS — I7121 Aneurysm of the ascending aorta, without rupture: Secondary | ICD-10-CM | POA: Diagnosis not present

## 2021-09-03 LAB — COMPREHENSIVE METABOLIC PANEL
ALT: 7 U/L (ref 0–44)
AST: 13 U/L — ABNORMAL LOW (ref 15–41)
Albumin: 3.1 g/dL — ABNORMAL LOW (ref 3.5–5.0)
Alkaline Phosphatase: 145 U/L — ABNORMAL HIGH (ref 38–126)
Anion gap: 10 (ref 5–15)
BUN: 17 mg/dL (ref 8–23)
CO2: 38 mmol/L — ABNORMAL HIGH (ref 22–32)
Calcium: 9.2 mg/dL (ref 8.9–10.3)
Chloride: 94 mmol/L — ABNORMAL LOW (ref 98–111)
Creatinine, Ser: 0.69 mg/dL (ref 0.44–1.00)
GFR, Estimated: >60 mL/min (ref >60)
Glucose, Bld: 147 mg/dL — ABNORMAL HIGH (ref 70–99)
Potassium: 4.2 mmol/L (ref 3.5–5.1)
Sodium: 142 mmol/L (ref 135–145)
Total Bilirubin: 0.5 mg/dL (ref 0.3–1.2)
Total Protein: 5.8 g/dL — ABNORMAL LOW (ref 6.5–8.1)

## 2021-09-03 LAB — CBC
HCT: 30.6 % — ABNORMAL LOW (ref 36.0–46.0)
Hemoglobin: 9.3 g/dL — ABNORMAL LOW (ref 12.0–15.0)
MCH: 29.9 pg (ref 26.0–34.0)
MCHC: 30.4 g/dL (ref 30.0–36.0)
MCV: 98.4 fL (ref 80.0–100.0)
Platelets: 259 10*3/uL (ref 150–400)
RBC: 3.11 MIL/uL — ABNORMAL LOW (ref 3.87–5.11)
RDW: 17.1 % — ABNORMAL HIGH (ref 11.5–15.5)
WBC: 5.2 10*3/uL (ref 4.0–10.5)
nRBC: 0 % (ref 0.0–0.2)

## 2021-09-03 LAB — GLUCOSE, CAPILLARY
Glucose-Capillary: 137 mg/dL — ABNORMAL HIGH (ref 70–99)
Glucose-Capillary: 234 mg/dL — ABNORMAL HIGH (ref 70–99)
Glucose-Capillary: 215 mg/dL — ABNORMAL HIGH (ref 70–99)
Glucose-Capillary: 209 mg/dL — ABNORMAL HIGH (ref 70–99)

## 2021-09-03 LAB — MAGNESIUM: Magnesium: 2 mg/dL (ref 1.7–2.4)

## 2021-09-03 MED ORDER — METHOCARBAMOL 500 MG PO TABS
500.0000 mg | ORAL_TABLET | Freq: Two times a day (BID) | ORAL | Status: DC
  Administered 2021-09-03 – 2021-09-09 (×14): 500 mg via ORAL
  Filled 2021-09-03 (×15): qty 1

## 2021-09-03 MED ORDER — INSULIN GLARGINE 100 UNIT/ML SOLOSTAR PEN: 8.0000 [IU] | PEN_INJECTOR | Freq: Every day | SUBCUTANEOUS | Status: DC

## 2021-09-03 MED ORDER — ONDANSETRON HCL 4 MG/2ML IJ SOLN
4.0000 mg | Freq: Once | INTRAMUSCULAR | Status: AC
  Administered 2021-09-03: 4 mg via INTRAVENOUS
  Filled 2021-09-03: qty 2

## 2021-09-03 MED ORDER — INSULIN GLARGINE-YFGN 100 UNIT/ML ~~LOC~~ SOLN
8.0000 [IU] | Freq: Every day | SUBCUTANEOUS | Status: DC
  Administered 2021-09-03 – 2021-09-04 (×2): 8 [IU] via SUBCUTANEOUS
  Filled 2021-09-03 (×2): qty 0.08

## 2021-09-03 MED ORDER — FERROUS GLUCONATE 324 (38 FE) MG PO TABS
324.0000 mg | ORAL_TABLET | Freq: Two times a day (BID) | ORAL | Status: DC
  Administered 2021-09-03 – 2021-09-16 (×27): 324 mg via ORAL
  Filled 2021-09-03 (×27): qty 1

## 2021-09-03 NOTE — Consult Note (Addendum)
?Cardiology Consultation:  ? ?Patient ID: Tara Bradley ?MRN: [id number]; DOB: 1922-09-16 ? ?Admit date: 09/02/2021 ?Date of Consult: 09/03/2021 ? ?PCP:  [name], MD ?  ?[facility] Providers ?Cardiologist:  [name], MD    ? ? ?Patient Profile:  ? ?Tara Bradley is a 88 y.o. female with a hx of chronic atrial fibrillation on Xarelto, chronic diastolic heart failure, aortic aneurysm with chronic descending dissection, aortic valve sclerosis, hypertension, hyperlipidemia, type 2 DM who is being seen 09/03/2021 for the evaluation of CHF at the request of Dr. Reesa Chew. ? ?History of Present Illness:  ? ?Tara Bradley is a 88 year old female with above medical history who is followed by Dr. Percival Spanish.  ? ?Per chart review, patient has been hospitalized multiple times over the past few months. Patient was admitted in December of 2022 with a  right humerus fracture after a fall and underwent reverse shoulder arthroplasty on 05/04/21. Her hospital course was complicated by acute on chronic diastolic CHF and uncontrolled atrial fibrillation. Patient was again admitted from 1/30.23-06/26/21 with acute on chronic diastolic heart failue and possible viral PNA. Echocardiogram on 06/23/21 showed LVEF 55-60%, grade II diastolic dysfunction, normal RV systolic function, moderately elevated pulmonary artery systolic pressure (62.5 mmHg), moderately dilated LA and RA, moderate to severe tricuspid valve regurgitation, aortic valve sclerosis without stenosis. Patient was diuresed and her metoprolol was increased due to difficult to control afib.  ? ?Patient recently presented to [facility] ED on 08/02/21 for left hip pain following a fall at home. She was found to have a displaced fracture of the proximal left femoral diaphysis. Patient underwent ORIF on 08/02/21. After her surgery, patient became hypotensive and anemic secondary to acute blood loss with a drop in hemoglobin from 12.3-7.1. Patient required 1 unit PRBC.  Patient also had frequent episodes of afib with RVR, so cardiology was consulted. Restarted home medications with improvement in rates. Patient discharged to SNF.  ? ?Patient was seen on 08/18/21 for follow up after her hospitalization. At that appointment, patient was volume overloaded and had significant lower extremity edema. She also reported new dyspnea on exertion and required supplemental oxygen. We recommended that she increase her daily dose of lasix and monitor her daily weights.  ? ?Patient was seen in the ED on 08/27/21 for progressive SOB. BNP was elevated to 605. hsTn negative x2. Chest x-ray showed central pulmonary vascular congestion, small bilateral pleural effusions, bibasilar infiltrates. Patient was given one dose of IV lasix in the ED with improvement and was discharged back to SNF.  ? ?Patient presented on 4/12 for follow up of her CHF. Patient reported that she continued to have significant dyspnea on exertion with minimal activity, mild orthopnea, increased supplemental oxygen requirements, lower extremity edema. Patient was up 30 pounds from her weight in 2/23. Recommended patient be admitted to the hospital for further IV diureses.  ? ?Pertinent labs include Na 142, KJ 4.2, creatinine 0.69, albumin 3.1, total protein 5.8, hemoglobin 9.3, WBC 5.2, platelets 259. hsTn 9>>9. BNP elevated to 495.4 (down from 605 on 4/6). CXR showed slightly improved pulmonary edema when compared to CXR 4/6, small bibasliar pleural effusions, bibasilar atelectasis. EKG showed atrial fibrillation, HR 84, IVCD, old anterior infarct.  ? ?On interview, patient confirms that she has been experiencing progressive SOB for the past few weeks. Endorsed pedal edema, orthopnea, some PND. Denies any chest pain, does experience some chest tightness. Denies palpitations. Feels some bloating in her abdomen.  ? ?Past  Medical History:  ?Diagnosis Date  ? Aortic valve sclerosis   ? Echo, 2008  ? Arthritis   ? Bradycardia   ?  October, 2012  ? Cancer [facility])   ? Chronic atrial fibrillation (HCC)   ? Chronic diastolic CHF (congestive heart failure) ([place])   ? Chronic insomnia   ? Closed fracture of unspecified part of femur 2005  ? Diabetes mellitus, type 2 ([place])   ? Diverticulitis   ? Diverticulosis   ? Hyperlipidemia   ? Hypertension   ? Long term (current) use of anticoagulants   ? Osteopenia   ? Osteoporosis   ? femur fracture 2005, pelvic fracture 2006  ? Personal history of malignant neoplasm of breast   ? Pneumonia   ? Syncope   ? Thoracic aortic aneurysm ([place]) 02/2017  ? chronic descending aorta dissection  ? Unspecified closed fracture of pelvis 2006  ? ? ?Past Surgical History:  ?Procedure Laterality Date  ? CARDIOVERSION N/A 08/09/2012  ? Procedure: CARDIOVERSION;  Surgeon: Deboraha Sprang, MD;  Location: [place];  Service: Cardiovascular;  Laterality: N/A;  ? EYE SURGERY    ? FEMUR SURGERY  2005  ? ORIF  ? INTRAMEDULLARY (IM) NAIL INTERTROCHANTERIC Left 08/02/2021  ? Procedure: INTRAMEDULLARY (IM) NAIL INTERTROCHANTRIC;  Surgeon: Willaim Sheng, MD;  Location: [place];  Service: Orthopedics;  Laterality: Left;  ? IR ANGIOGRAM PELVIS SELECTIVE OR SUPRASELECTIVE  03/11/2017  ? IR ANGIOGRAM SELECTIVE EACH ADDITIONAL VESSEL  03/11/2017  ? IR EMBO ART  VEN HEMORR LYMPH EXTRAV  INC GUIDE ROADMAPPING  03/11/2017  ? IR FLUORO GUIDE CV LINE RIGHT  03/11/2017  ? IR US GUIDE VASC ACCESS RIGHT  03/11/2017  ? IR US GUIDE VASC ACCESS RIGHT  03/11/2017  ? MASTECTOMY  1995  ? left  ? MINOR HARDWARE REMOVAL Left 09/01/2020  ? Procedure: REMOVAL OF HARDWARE LEFT FOREARM;  Surgeon: Milly Jakob, MD;  Location: [place];  Service: Orthopedics;  Laterality: Left;  ? ORIF ULNAR FRACTURE Left 10/19/2019  ? Procedure: OPEN REDUCTION INTERNAL FIXATION (ORIF)  LEFT DISTAL RADIUS  AND ULNA FRACTURE;  Surgeon: Milly Jakob, MD;  Location: [place];  Service: Orthopedics;  Laterality: Left;  ? REVERSE SHOULDER ARTHROPLASTY Right  05/04/2021  ? Procedure: REVERSE SHOULDER ARTHROPLASTY;  Surgeon: Netta Cedars, MD;  Location: [place];  Service: Orthopedics;  Laterality: Right;  ? SHOULDER SURGERY    ? left  ? [place], 2003  ? TONSILLECTOMY    ? TOTAL ABDOMINAL HYSTERECTOMY W/ BILATERAL SALPINGOOPHORECTOMY  1995  ? WRIST SURGERY    ?  x 2   ?  ? ?Home Medications:  ?Prior to Admission medications   ?Medication Sig Start Date End Date Taking? Authorizing Provider  ?acetaminophen (TYLENOL) 500 MG tablet Take 2 tablets (1,000 mg total) by mouth every 8 (eight) hours as needed. ?Patient taking differently: Take 1,000 mg by mouth 3 (three) times daily. 08/04/21 09/03/21 Yes Willaim Sheng, MD  ?Calcium Carbonate-Vitamin D 600-400 MG-UNIT tablet Take 1 tablet by mouth daily.   Yes [provider]  ?Cholecalciferol (VITAMIN D) 125 MCG (5000 UT) CAPS Take 5,000 Units by mouth daily.   Yes [provider]  ?Dextromethorphan-guaiFENesin 5-100 MG/5ML LIQD Take 10 mLs by mouth 3 (three) times daily.   Yes [provider]  ?digoxin 62.5 MCG TABS Take 0.0625 mg by mouth daily. 08/07/21  Yes Shelly Coss, MD  ?diltiazem (CARDIZEM CD) 360 MG 24 hr capsule Take 1 capsule (  360 mg total) by mouth daily. 05/12/21  Yes Shelly Coss, MD  ?eszopiclone (LUNESTA) 2 MG TABS tablet Take 2 mg by mouth at bedtime. Take immediately before bedtime   Yes [provider]  ?ferrous gluconate (FERGON) 324 MG tablet Take 324 mg by mouth 2 (two) times daily with a meal.   Yes [provider]  ?furosemide (LASIX) 40 MG tablet TAKE ONE TABLET BY MOUTH DAILY ?Patient taking differently: Take 40 mg by mouth daily. Hold for SBP less than 110. 07/29/21  Yes Hochrein, Jeneen Rinks, MD  ?gabapentin (NEURONTIN) 100 MG capsule Take 100 mg by mouth 3 (three) times daily. 08/14/21  Yes [provider]  ?insulin glargine (LANTUS) 100 UNIT/ML Solostar Pen Inject 8 Units into the skin daily.   Yes [provider]   ?insulin lispro (HUMALOG) 100 UNIT/ML injection Inject 0-12 Units into the skin daily. Sliding scale : If blood sugar less than 70, call NP ?70-200 = 0 units ?201-250 = 2 units ?251-300 = 4 units ?301-350 = 6 units ?351-400

## 2021-09-03 NOTE — Evaluation (Signed)
Physical Therapy Evaluation ?Patient Details ?Name: Tara Bradley ?MRN: [id number] ?DOB: 1923/05/18 ?Today's Date: 09/03/2021 ? ?History of Present Illness ? SENYA HINZMAN is a 88 y.o. female with medical history significant of past medical history of chronic atrial fibrillation on Xarelto, diastolic CHF ef 37% S2GB, DM 2, HLD, HTN, osteoporosis, thoracic aortic aneurysm, DM 2 comes to the hospital from cardiology office for evaluation of shortness of breath.BNP is elevated, chest x-ray suggestive of pulmonary edema/effusion  ?Clinical Impression ? The patient is seated on bed edge with CNA present. Patient  min guard to stand and step to recliner. Noted 4/4 dyspnea. Patient has difficulty speaking due to SOB.  Patient reports right upper back pain , especially when SOB. ? Patient  very independent PTA and states she wants to return home, "I will hire help."  ?Pt admitted with above diagnosis.  Pt currently with functional limitations due to the deficits listed below (see PT Problem List). Pt will benefit from skilled PT to increase their independence and safety with mobility to allow discharge to the venue listed below.   ? ?   ? ?Recommendations for follow up therapy are one component of a multi-disciplinary discharge planning process, led by the attending physician.  Recommendations may be updated based on patient status, additional functional criteria and insurance authorization. ? ?Follow Up Recommendations Skilled nursing-short term rehab (<3 hours/day) vs caregivers at home and HHPT-pt. Does not want to go SNF again. ? ?  ?Assistance Recommended at Discharge    ?Patient can return home with the following ? A little help with walking and/or transfers;A little help with bathing/dressing/bathroom;Help with stairs or ramp for entrance;Assist for transportation;Assistance with cooking/housework ? ?  ?Equipment Recommendations None recommended by PT  ?Recommendations for Other Services ?    ?  ?Functional Status  Assessment Patient has had a recent decline in their functional status and demonstrates the ability to make significant improvements in function in a reasonable and predictable amount of time.  ? ?  ?Precautions / Restrictions Precautions ?Precautions: Fall ?Precaution Comments: SOB, ck O2 ?Restrictions ?Weight Bearing Restrictions: No  ? ?  ? ?Mobility ? Bed Mobility ?  ?  ?  ?  ?  ?  ?  ?General bed mobility comments: seated on side of bed ?  ? ?Transfers ?Overall transfer level: Needs assistance ?Equipment used: Rolling walker (2 wheels) ?Transfers: Sit to/from Stand, Bed to chair/wheelchair/BSC ?Sit to Stand: Min guard ?  ?Step pivot transfers: +2 safety/equipment ?  ?  ?  ?General transfer comment: patient rises and able to step to recliner using RW. ?  ? ?Ambulation/Gait ?  ?  ?  ?  ?  ?  ?  ?General Gait Details: TBA, SOB with activity ? ?Stairs ?  ?  ?  ?  ?  ? ?Wheelchair Mobility ?  ? ?Modified Rankin (Stroke Patients Only) ?  ? ?  ? ?Balance Overall balance assessment: Needs assistance ?Sitting-balance support: Feet supported, Bilateral upper extremity supported ?Sitting balance-Leahy Scale: Fair ?  ?  ?Standing balance support: During functional activity, Bilateral upper extremity supported, Reliant on assistive device for balance ?Standing balance-Leahy Scale: Fair ?  ?  ?  ?  ?  ?  ?  ?  ?  ?  ?  ?  ?   ? ? ? ?Pertinent Vitals/Pain Pain Assessment ?Pain Assessment: No/denies pain  ? ? ?Home Living Family/patient expects to be discharged to:: Private residence ?Living Arrangements: Alone ?Available Help at Discharge:  Friend(s);Available PRN/intermittently ?Type of Home: Apartment ?Home Access: Level entry;Elevator ?  ?  ?  ?Home Layout: One level ?Home Equipment: Conservation officer, nature (2 wheels);Cane - single point;Cane - quad;Shower seat;Rollator (4 wheels);BSC/3in1;Wheelchair - manual ?Additional Comments: wants to go home and hire help  ?  ?Prior Function Prior Level of Function :  Driving;Independent/Modified Independent ?  ?  ?  ?  ?  ?  ?Mobility Comments: Uses the rollator to go to the mailboxes, occasionally uses RW or SPC. ?ADLs Comments: Independent with all ADL's, cooking, cleaning. ?  ? ? ?Hand Dominance  ? Dominant Hand: Right ? ?  ?Extremity/Trunk Assessment  ?   ?  ? ?Lower Extremity Assessment ?Lower Extremity Assessment: Generalized weakness ?  ? ?Cervical / Trunk Assessment ?Cervical / Trunk Assessment: Kyphotic  ?Communication  ? Communication: Other (comment) (noted  llimited conversation due to SOB)  ?Cognition Arousal/Alertness: Awake/alert ?Behavior During Therapy: [facility] for tasks assessed/performed ?Overall Cognitive Status: Within Functional Limits for tasks assessed ?  ?  ?  ?  ?  ?  ?  ?  ?  ?  ?  ?  ?  ?  ?  ?  ?  ?  ?  ? ?  ?General Comments   ? ?  ?Exercises    ? ?Assessment/Plan  ?  ?PT Assessment Patient needs continued PT services  ?PT Problem List Decreased strength;Decreased mobility;Decreased knowledge of precautions;Cardiopulmonary status limiting activity;Decreased activity tolerance ? ?   ?  ?PT Treatment Interventions DME instruction;Therapeutic activities;Gait training;Therapeutic exercise;Patient/family education;Functional mobility training   ? ?PT Goals (Current goals can be found in the Care Plan section)  ?Acute Rehab PT Goals ?Patient Stated Goal: I want to go home and hire help ?PT Goal Formulation: With patient ?Time For Goal Achievement: 09/17/21 ?Potential to Achieve Goals: Fair ? ?  ?Frequency Min 3X/week ?  ? ? ?Co-evaluation PT/OT/SLP Co-Evaluation/Treatment: Yes ?Reason for Co-Treatment: For patient/therapist safety ?PT goals addressed during session: Mobility/safety with mobility ?OT goals addressed during session: ADL's and self-care ?  ? ? ?  ?AM-PAC PT "6 Clicks" Mobility  ?Outcome Measure Help needed turning from your back to your side while in a flat bed without using bedrails?: A Little ?Help needed moving from lying on your back to  sitting on the side of a flat bed without using bedrails?: A Little ?Help needed moving to and from a bed to a chair (including a wheelchair)?: A Little ?Help needed standing up from a chair using your arms (e.g., wheelchair or bedside chair)?: A Little ?Help needed to walk in hospital room?: Total ?Help needed climbing 3-5 steps with a railing? : Total ?6 Click Score: 14 ? ?  ?End of Session Equipment Utilized During Treatment: Oxygen ?Activity Tolerance: Treatment limited secondary to medical complications (Comment) ?Patient left: in chair;with call bell/phone within reach;with chair alarm set ?Nurse Communication: Mobility status ?PT Visit Diagnosis: Muscle weakness (generalized) (M62.81);Difficulty in walking, not elsewhere classified (R26.2) ?  ? ?Time: 1638-4665 ?PT Time Calculation (min) (ACUTE ONLY): 15 min ? ? ?Charges:   PT Evaluation ?$PT Eval Low Complexity: 1 Low ?  ?  ?   ? ? ?Tresa Endo PT ?Acute Rehabilitation Services ?Pager [phone] ?Office [phone] ? ? ?Dorette Hartel, Shella Maxim ?09/03/2021, 10:40 AM ? ?

## 2021-09-03 NOTE — Plan of Care (Signed)
?  Problem: Activity: ?Goal: Capacity to carry out activities will improve ?Outcome: Progressing ?  ?Problem: Education: ?Goal: Knowledge of General Education information will improve ?Description: Including pain rating scale, medication(s)/side effects and non-pharmacologic comfort measures ?Outcome: Progressing ?  ?Problem: Activity: ?Goal: Risk for activity intolerance will decrease ?Outcome: Progressing ?  ?Problem: Nutrition: ?Goal: Adequate nutrition will be maintained ?Outcome: Progressing ?  ?

## 2021-09-03 NOTE — Progress Notes (Addendum)
Pt desaturated 82-83% on 3L [place] while sleeping. Increased O2 to 4L  and called respiratory therapist for a breathing Tx. Pt is on 5L ,SpO2 95-97%. Will continue to monitor pt. ?

## 2021-09-03 NOTE — Progress Notes (Signed)
Occupational Therapy Evaluation ? ?Patient lives in apartment alone and is typically independent with self care and IADL tasks. Patient uses rollator to ambulate to mailbox, sometimes uses cane or walker in the house. Currently patient limited by dyspnea with mild exertion, becomes short of breath after transfer to recliner with min A. Also difficult to answer questions, tried keeping to yes/no. O2 reading 91% on 5L post transfer to recliner. Patient reports she will likely have to hire some assistance at home, currently recommending [place] if patient can arrange assistance at D/C. Acute OT to follow. ? ? ? 09/03/21 1200  ?OT Visit Information  ?Last OT Received On 09/03/21  ?PT/OT/SLP Co-Evaluation/Treatment Yes  ?Reason for Co-Treatment For patient/therapist safety;To address functional/ADL transfers  ?PT goals addressed during session Mobility/safety with mobility  ?OT goals addressed during session ADL's and self-care  ?History of Present Illness Tara Bradley is a 88 y.o. female with medical history significant of past medical history of chronic atrial fibrillation on Xarelto, diastolic CHF ef 64% Q0HK, DM 2, HLD, HTN, osteoporosis, thoracic aortic aneurysm, DM 2 comes to the hospital from cardiology office for evaluation of shortness of breath.BNP is elevated, chest x-ray suggestive of pulmonary edema/effusion  ?Precautions  ?Precautions Fall  ?Precaution Comments SOB, monitor O2  ?Restrictions  ?Weight Bearing Restrictions No  ?Home Living  ?Family/patient expects to be discharged to: Private residence  ?Living Arrangements Alone  ?Available Help at Discharge Friend(s);Available PRN/intermittently  ?Type of Home Apartment  ?Home Access Level entry;Elevator  ?Home Layout One level  ?Bathroom Shower/Tub Walk-in shower  ?Bathroom Toilet Handicapped height  ?Home Equipment [employer] (2 wheels);Cane - single point;Cane - quad;Shower seat;Rollator (4 wheels);BSC/3in1;Wheelchair - manual  ?Additional Comments  wants to go home and hire help  ?Prior Function  ?Prior Level of Function  Driving;Independent/Modified Independent  ?Mobility Comments Uses the rollator to go to the mailboxes, occasionally uses RW or SPC.  ?ADLs Comments Independent with all ADL's, cooking, cleaning.  ?Communication  ?Communication Other (comment) ?(limited conversation 2* SOB)  ?Pain Assessment  ?Pain Assessment Faces  ?Faces Pain Scale 8  ?Pain Location back, R flank  ?Pain Descriptors / Indicators Aching;Discomfort;Sharp  ?Pain Intervention(s) Monitored during session  ?Cognition  ?Arousal/Alertness Awake/alert  ?Behavior During Therapy [facility] for tasks assessed/performed  ?Overall Cognitive Status Within Functional Limits for tasks assessed  ?Upper Extremity Assessment  ?Upper Extremity Assessment Generalized weakness  ?Lower Extremity Assessment  ?Lower Extremity Assessment Defer to PT evaluation  ?Cervical / Trunk Assessment  ?Cervical / Trunk Assessment Kyphotic  ?ADL  ?Overall ADL's  Needs assistance/impaired  ?Eating/Feeding Independent;Sitting  ?Grooming Set up;Sitting  ?Upper Body Bathing Minimal assistance;Sitting  ?Lower Body Bathing Moderate assistance;Sitting/lateral leans  ?Upper Body Dressing  Minimal assistance;Sitting  ?Lower Body Dressing Maximal assistance;Sit to/from stand;Sitting/lateral leans  ?Lower Body Dressing Details (indicate cue type and reason) Patient with limited activity tolerance 2* dyspnea with minimal exertion  ?Toilet Transfer Minimal assistance;Rolling walker (2 wheels)  ?Toilet Transfer Details (indicate cue type and reason) Patient is able to power up to standing without physical assistance, min A for safety transferring to recliner due to poor endurance, dyspnea  ?Toileting- Clothing Manipulation and Hygiene Moderate assistance;Sitting/lateral lean;Sit to/from stand  ?Functional mobility during ADLs Minimal assistance;Rolling walker (2 wheels)  ?General ADL Comments Patient primarily limited by  cardiopulmonary status resulting in decreased endurance, safety, and independence with self care.  ?Bed Mobility  ?General bed mobility comments seated on side of bed  ?Balance  ?Overall balance assessment Needs assistance  ?  Sitting-balance support Feet supported  ?Sitting balance-Leahy Scale Fair  ?Standing balance support Reliant on assistive device for balance  ?Standing balance-Leahy Scale Poor  ?General Comments  ?General comments (skin integrity, edema, etc.) 91% post transfer on 5L  O2  ?OT - End of Session  ?Equipment Utilized During Treatment Oxygen;Rolling walker (2 wheels)  ?Activity Tolerance Patient limited by fatigue  ?Patient left with call bell/phone within reach;with chair alarm set;in chair  ?Nurse Communication Mobility status  ?OT Assessment  ?OT Recommendation/Assessment Patient needs continued OT Services  ?OT Visit Diagnosis Unsteadiness on feet (R26.81);Other abnormalities of gait and mobility (R26.89);Muscle weakness (generalized) (M62.81)  ?OT Problem List Decreased strength;Decreased activity tolerance;Impaired balance (sitting and/or standing);Decreased safety awareness;Cardiopulmonary status limiting activity  ?OT Plan  ?OT Frequency (ACUTE ONLY) Min 2X/week  ?OT Treatment/Interventions (ACUTE ONLY) Self-care/ADL training;Balance training;Patient/family education;Therapeutic activities;Therapeutic exercise  ?AM-PAC OT "6 Clicks" Daily Activity Outcome Measure (Version 2)  ?Help from another person eating meals? 4  ?Help from another person taking care of personal grooming? 3  ?Help from another person toileting, which includes using toliet, bedpan, or urinal? 2  ?Help from another person bathing (including washing, rinsing, drying)? 2  ?Help from another person to put on and taking off regular upper body clothing? 3  ?Help from another person to put on and taking off regular lower body clothing? 2  ?6 Click Score 16  ?Progressive Mobility  ?What is the highest level of mobility based on  the progressive mobility assessment? Level 4 (Walks with assist in room) - Balance while marching in place and cannot step forward and back - Complete  ?Activity Transferred from bed to chair  ?OT Recommendation  ?Follow Up Recommendations Home health OT ?(if able to hire caregiver support)  ?Assistance recommended at discharge Frequent or constant Supervision/Assistance  ?Patient can return home with the following A little help with walking and/or transfers;A lot of help with bathing/dressing/bathroom;Assistance with cooking/housework;Help with stairs or ramp for entrance;Assist for transportation  ?Functional Status Assessent Patient has had a recent decline in their functional status and demonstrates the ability to make significant improvements in function in a reasonable and predictable amount of time.  ?OT Equipment None recommended by OT  ?Individuals Consulted  ?Consulted and Agree with Results and Recommendations Patient  ?Acute Rehab OT Goals  ?Patient Stated Goal Go home  ?OT Goal Formulation With patient  ?Time For Goal Achievement 09/17/21  ?Potential to Achieve Goals Good  ?OT Time Calculation  ?OT Start Time (ACUTE ONLY) 0756  ?OT Stop Time (ACUTE ONLY) 0810  ?OT Time Calculation (min) 14 min  ?OT General Charges  ?$OT Visit 1 Visit  ?OT Evaluation  ?$OT Eval Low Complexity 1 Low  ?Written Expression  ?Dominant Hand Right  ? ?Delbert Phenix OT ?OT pager: [phone] ? ?

## 2021-09-03 NOTE — Progress Notes (Signed)
?PROGRESS NOTE ? ? ? ?Tara Bradley  VEL:[id number] DOB: 22-Apr-1923 DOA: 09/02/2021 ?PCP: [name], MD  ? ?Brief Narrative:  ? 88 y.o. female with medical history significant of past medical history of chronic atrial fibrillation on Xarelto, diastolic CHF ef 25% E5ID, DM 2, HLD, HTN, osteoporosis, thoracic aortic aneurysm, DM 2 comes to the hospital from cardiology office for evaluation of shortness of breath.  Patient was diagnosed CHF exacerbation and started on diuretics.  Cardiology team was consulted. ? ? ?Assessment & Plan: ? Principal Problem: ?  Acute exacerbation of CHF (congestive heart failure) ([place]) ?Active Problems: ?  Essential hypertension ?  Osteoarthritis ?  Aortic valve sclerosis ?  Hyperlipidemia ?  Diabetes mellitus, type 2 ([place]) ?  Chronic atrial fibrillation ([facility]) ?  ? ?Acute diastolic congestive heart failure, EF 55%, class IV. g2DD ?Acute respiratory distress with tachypnea saturating 90% on 2 L nasal cannula ?-Still requiring 3 L nasal cannula, not on home oxygen.  On room air she desaturates down to less than 90%.  She is tachypneic when speaking.  Continue Lasix 40 mg IV twice daily.  Cardiology consulted ?  ?Chronic atrial fibrillation ?- On home Cardizem, Toprol-XL twice daily and digoxin.  Continue Xarelto. ?  ?Diabetes mellitus type 2 ?- Holding home metformin.  Sliding scale and Accu-Cheks. ?  ?Peripheral neuropathy secondary to DM2 ?- Continue gabapentin ?  ?Anemia of chronic disease ?- Stable hemoglobin ?  ?Ascending aortic aneurysm ?- Stable previously seen by CT surgery.  No further follow-up needed ?  ?Essential hypertension ?- On Cardizem and Toprol-XL.  IV Lopressor and hydralazine as needed added. ?  ?Hyperlipidemia ?- Not on statin ?  ?Recent hip and shoulder fracture ?- Status post intramedullary nailing on 08/02/2021.  PT/OT ? ? ?DVT prophylaxis: On Xarelto ?Code Status: DNR ?Family Communication:   ? ?Status is: Inpatient ?Remains inpatient appropriate because:  Patient requires aggressive IV diuretics while monitoring renal function.  She is also tachypneic requiring supplemental oxygen.  Not on home oxygen.  At this time I anticipate she will be in the hospital least for the next 3-5 days. ? ? ? ?Subjective: ?Sitting up in the chair, feels slightly better compared to yesterday but still tachypneic when speaking in long sentences. ? ?Examination: ? ?General exam: Appears calm and comfortable  ?Respiratory system: Bilateral crackles midway up the lung field.  Tachypneic when speaking in long sentences. ?Cardiovascular system: S1 & S2 heard, RRR. No JVD, murmurs, rubs, gallops or clicks.  3+ bilateral lower extremity pitting edema ?Gastrointestinal system: Abdomen is nondistended, soft and nontender. No organomegaly or masses felt. Normal bowel sounds heard. ?Central nervous system: Alert and oriented. No focal neurological deficits. ?Extremities: Symmetric 5 x 5 power. ?Skin: No rashes, lesions or ulcers ?Psychiatry: Judgement and insight appear normal. Mood & affect appropriate.  ? ? ? ?Objective: ?Vitals:  ? 09/03/21 0257 09/03/21 0500 09/03/21 0508 09/03/21 0749  ?BP:   (!) 129/59 119/66  ?Pulse:   69 91  ?Resp:    (!) 22  ?Temp:   97.7 ?F (36.5 ?C) 98.2 ?F (36.8 ?C)  ?TempSrc:   Oral Oral  ?SpO2: 95%  95% 92%  ?Weight:  76.4 kg    ?Height:      ? ? ?Intake/Output Summary (Last 24 hours) at 09/03/2021 1045 ?Last data filed at 09/03/2021 1000 ?Gross per 24 hour  ?Intake 480 ml  ?Output 650 ml  ?Net -170 ml  ? ?Filed Weights  ? 09/02/21 1254 09/03/21 0500  ?  Weight: 75.8 kg 76.4 kg  ? ? ? ?Data Reviewed:  ? ?CBC: ?Recent Labs  ?Lab 08/27/21 ?2334 09/02/21 ?1318 09/03/21 ?0356  ?WBC 4.3 6.5 5.2  ?NEUTROABS 3.2 4.8  --   ?HGB 8.7* 10.0* 9.3*  ?HCT 29.9* 33.5* 30.6*  ?MCV 102.7* 99.4 98.4  ?PLT 255 280 259  ? ?Basic Metabolic Panel: ?Recent Labs  ?Lab 08/27/21 ?2334 09/02/21 ?1318 09/03/21 ?0356  ?NA 137 137 142  ?K 5.0 4.2 4.2  ?CL 99 93* 94*  ?CO2 33* 37* 38*  ?GLUCOSE 195*  144* 147*  ?BUN '19 18 17  '$ ?CREATININE 0.75 0.44 0.69  ?CALCIUM 8.8* 9.4 9.2  ?MG  --   --  2.0  ? ?GFR: ?Estimated Creatinine Clearance: 41.6 mL/min (by C-G formula based on SCr of 0.69 mg/dL). ?Liver Function Tests: ?Recent Labs  ?Lab 08/27/21 ?2334 09/02/21 ?1318 09/03/21 ?0356  ?AST 15 18 13*  ?ALT '9 7 7  '$ ?ALKPHOS 174* 169* 145*  ?BILITOT 0.6 0.5 0.5  ?PROT 5.5* 6.7 5.8*  ?ALBUMIN 2.9* 3.6 3.1*  ? ?No results for input(s): LIPASE, AMYLASE in the last 168 hours. ?No results for input(s): AMMONIA in the last 168 hours. ?Coagulation Profile: ?No results for input(s): INR, PROTIME in the last 168 hours. ?Cardiac Enzymes: ?No results for input(s): CKTOTAL, CKMB, CKMBINDEX, TROPONINI in the last 168 hours. ?BNP (last 3 results) ?No results for input(s): PROBNP in the last 8760 hours. ?HbA1C: ?No results for input(s): HGBA1C in the last 72 hours. ?CBG: ?Recent Labs  ?Lab 09/02/21 ?1901 09/02/21 ?2056 09/03/21 ?0747  ?GLUCAP 241* 186* 137*  ? ?Lipid Profile: ?No results for input(s): CHOL, HDL, LDLCALC, TRIG, CHOLHDL, LDLDIRECT in the last 72 hours. ?Thyroid Function Tests: ?No results for input(s): TSH, T4TOTAL, FREET4, T3FREE, THYROIDAB in the last 72 hours. ?Anemia Panel: ?No results for input(s): VITAMINB12, FOLATE, FERRITIN, TIBC, IRON, RETICCTPCT in the last 72 hours. ?Sepsis Labs: ?Recent Labs  ?Lab 08/27/21 ?2332 08/28/21 ?2505  ?LATICACIDVEN 0.7 1.0  ? ? ?Recent Results (from the past 240 hour(s))  ?Resp Panel by RT-PCR (Flu A&B, Covid) Nasopharyngeal Swab     Status: None  ? Collection Time: 08/27/21 10:42 PM  ? Specimen: Nasopharyngeal Swab; Nasopharyngeal(NP) swabs in vial transport medium  ?Result Value Ref Range Status  ? SARS Coronavirus 2 by RT PCR NEGATIVE NEGATIVE Final  ?  Comment: (NOTE) ?SARS-CoV-2 target nucleic acids are NOT DETECTED. ? ?The SARS-CoV-2 RNA is generally detectable in upper respiratory ?specimens during the acute phase of infection. The lowest ?concentration of SARS-CoV-2 viral  copies this assay can detect is ?138 copies/mL. A negative result does not preclude SARS-Cov-2 ?infection and should not be used as the sole basis for treatment or ?other patient management decisions. A negative result may occur with  ?improper specimen collection/handling, submission of specimen other ?than nasopharyngeal swab, presence of viral mutation(s) within the ?areas targeted by this assay, and inadequate number of viral ?copies(<138 copies/mL). A negative result must be combined with ?clinical observations, patient history, and epidemiological ?information. The expected result is Negative. ? ?Fact Sheet for Patients:  ?EntrepreneurPulse.com.au ? ?Fact Sheet for Healthcare Providers:  ?IncredibleEmployment.be ? ?This test is no t yet approved or cleared by the [place] FDA and  ?has been authorized for detection and/or diagnosis of SARS-CoV-2 by ?FDA under an Emergency Use Authorization (EUA). This EUA will remain  ?in effect (meaning this test can be used) for the duration of the ?COVID-19 declaration under Section 564(b)(1) of the Act, 21 ?U.S.C.section  360bbb-3(b)(1), unless the authorization is terminated  ?or revoked sooner.  ? ? ?  ? Influenza A by PCR NEGATIVE NEGATIVE Final  ? Influenza B by PCR NEGATIVE NEGATIVE Final  ?  Comment: (NOTE) ?The Xpert Xpress SARS-CoV-2/FLU/RSV plus assay is intended as an aid ?in the diagnosis of influenza from Nasopharyngeal swab specimens and ?should not be used as a sole basis for treatment. Nasal washings and ?aspirates are unacceptable for Xpert Xpress SARS-CoV-2/FLU/RSV ?testing. ? ?Fact Sheet for Patients: ?EntrepreneurPulse.com.au ? ?Fact Sheet for Healthcare Providers: ?IncredibleEmployment.be ? ?This test is not yet approved or cleared by the [place] FDA and ?has been authorized for detection and/or diagnosis of SARS-CoV-2 by ?FDA under an Emergency Use Authorization (EUA). This  EUA will remain ?in effect (meaning this test can be used) for the duration of the ?COVID-19 declaration under Section 564(b)(1) of the Act, 21 U.S.C. ?section 360bbb-3(b)(1), unless the authorization is termina

## 2021-09-04 ENCOUNTER — Inpatient Hospital Stay (HOSPITAL_COMMUNITY): Payer: Medicare Other

## 2021-09-04 DIAGNOSIS — I5033 Acute on chronic diastolic (congestive) heart failure: Secondary | ICD-10-CM | POA: Diagnosis not present

## 2021-09-04 DIAGNOSIS — E119 Type 2 diabetes mellitus without complications: Secondary | ICD-10-CM | POA: Diagnosis not present

## 2021-09-04 DIAGNOSIS — R079 Chest pain, unspecified: Secondary | ICD-10-CM

## 2021-09-04 DIAGNOSIS — I358 Other nonrheumatic aortic valve disorders: Secondary | ICD-10-CM | POA: Diagnosis not present

## 2021-09-04 DIAGNOSIS — I482 Chronic atrial fibrillation, unspecified: Secondary | ICD-10-CM | POA: Diagnosis not present

## 2021-09-04 LAB — CBC
HCT: 31.8 % — ABNORMAL LOW (ref 36.0–46.0)
Hemoglobin: 9.4 g/dL — ABNORMAL LOW (ref 12.0–15.0)
MCH: 29.7 pg (ref 26.0–34.0)
MCHC: 29.6 g/dL — ABNORMAL LOW (ref 30.0–36.0)
MCV: 100.3 fL — ABNORMAL HIGH (ref 80.0–100.0)
Platelets: 253 10*3/uL (ref 150–400)
RBC: 3.17 MIL/uL — ABNORMAL LOW (ref 3.87–5.11)
RDW: 17.2 % — ABNORMAL HIGH (ref 11.5–15.5)
WBC: 5.3 10*3/uL (ref 4.0–10.5)
nRBC: 0 % (ref 0.0–0.2)

## 2021-09-04 LAB — COMPREHENSIVE METABOLIC PANEL
ALT: 7 U/L (ref 0–44)
AST: 12 U/L — ABNORMAL LOW (ref 15–41)
Albumin: 2.9 g/dL — ABNORMAL LOW (ref 3.5–5.0)
Alkaline Phosphatase: 141 U/L — ABNORMAL HIGH (ref 38–126)
Anion gap: 6 (ref 5–15)
BUN: 26 mg/dL — ABNORMAL HIGH (ref 8–23)
CO2: 37 mmol/L — ABNORMAL HIGH (ref 22–32)
Calcium: 8.8 mg/dL — ABNORMAL LOW (ref 8.9–10.3)
Chloride: 95 mmol/L — ABNORMAL LOW (ref 98–111)
Creatinine, Ser: 0.8 mg/dL (ref 0.44–1.00)
GFR, Estimated: >60 mL/min (ref >60)
Glucose, Bld: 129 mg/dL — ABNORMAL HIGH (ref 70–99)
Potassium: 4.5 mmol/L (ref 3.5–5.1)
Sodium: 138 mmol/L (ref 135–145)
Total Bilirubin: 0.7 mg/dL (ref 0.3–1.2)
Total Protein: 5.4 g/dL — ABNORMAL LOW (ref 6.5–8.1)

## 2021-09-04 LAB — GLUCOSE, CAPILLARY
Glucose-Capillary: 132 mg/dL — ABNORMAL HIGH (ref 70–99)
Glucose-Capillary: 190 mg/dL — ABNORMAL HIGH (ref 70–99)
Glucose-Capillary: 176 mg/dL — ABNORMAL HIGH (ref 70–99)
Glucose-Capillary: 146 mg/dL — ABNORMAL HIGH (ref 70–99)

## 2021-09-04 LAB — ECHOCARDIOGRAM LIMITED
AR max vel: 1.26 cm2
AV Area VTI: 1.49 cm2
AV Area mean vel: 1.18 cm2
AV Mean grad: 6 mmHg
AV Peak grad: 10.6 mmHg
Ao pk vel: 1.63 m/s
Area-P 1/2: 4.49 cm2
Height: 65.5 in
S' Lateral: 2.8 cm
Weight: 2663.16 oz

## 2021-09-04 LAB — D-DIMER, QUANTITATIVE: D-Dimer, Quant: 2.89 ug/mL-FEU — ABNORMAL HIGH (ref 0.00–0.50)

## 2021-09-04 LAB — MAGNESIUM: Magnesium: 2 mg/dL (ref 1.7–2.4)

## 2021-09-04 MED ORDER — INSULIN GLARGINE-YFGN 100 UNIT/ML ~~LOC~~ SOLN
10.0000 [IU] | Freq: Every day | SUBCUTANEOUS | Status: DC
  Administered 2021-09-05 – 2021-09-14 (×10): 10 [IU] via SUBCUTANEOUS
  Filled 2021-09-04 (×11): qty 0.1

## 2021-09-04 MED ORDER — FUROSEMIDE 10 MG/ML IJ SOLN
40.0000 mg | Freq: Once | INTRAMUSCULAR | Status: AC
  Administered 2021-09-04: 40 mg via INTRAVENOUS
  Filled 2021-09-04: qty 4

## 2021-09-04 MED ORDER — FUROSEMIDE 10 MG/ML IJ SOLN
80.0000 mg | Freq: Two times a day (BID) | INTRAMUSCULAR | Status: DC
  Administered 2021-09-04 – 2021-09-07 (×6): 80 mg via INTRAVENOUS
  Filled 2021-09-04 (×6): qty 8

## 2021-09-04 MED ORDER — BOOST / RESOURCE BREEZE PO LIQD CUSTOM
1.0000 | Freq: Three times a day (TID) | ORAL | Status: DC
  Administered 2021-09-04 – 2021-09-15 (×24): 1 via ORAL

## 2021-09-04 NOTE — TOC Progression Note (Signed)
Transition of Care (TOC) - Progression Note  ? ? ?Patient Details  ?Name: Tara Bradley ?MRN: [id number] ?Date of Birth: 09/01/1922 ? ?Transition of Care (TOC) CM/SW Contact  ?Purcell Mouton, RN ?Phone Number: ?09/04/2021, 12:51 PM ? ?Clinical Narrative:    ?Pt is from [facility] and plan to return when stable.  ? ? ?Expected Discharge Plan: [place] ?Barriers to Discharge: No Barriers Identified ? ?Expected Discharge Plan and Services ?Expected Discharge Plan: [place] ?  ?Discharge Planning Services: CM Consult ?  ?Living arrangements for the past 2 months: [place] ?                ?  ?  ?  ?  ?  ?  ?  ?  ?  ?  ? ? ?Social Determinants of Health (SDOH) Interventions ?  ? ?Readmission Risk Interventions ? ?  05/11/2021  ?  1:11 PM  ?Readmission Risk Prevention Plan  ?Transportation Screening Complete  ?Home Care Screening Complete  ? ? ?

## 2021-09-04 NOTE — Progress Notes (Addendum)
? ?Progress Note ? ?Patient Name: MARYALYCE SANJUAN ?Date of Encounter: 09/04/2021 ? ?[facility] Cardiologist: [name], MD  ? ?Subjective  ? ?Patient has noticed some improvement in her breathing. Still requiring supplemental oxygen. Still with significant lower extremity edema. Denies any chest pain, palpitations. Understands her fluid restrictions  ? ?Inpatient Medications  ?  ?Scheduled Meds: ? digoxin  0.0625 mg Oral Daily  ? diltiazem  360 mg Oral Daily  ? feeding supplement  1 Container Oral TID BM  ? ferrous gluconate  324 mg Oral BID WC  ? furosemide  40 mg Intravenous BID  ? gabapentin  100 mg Oral TID  ? insulin aspart  0-9 Units Subcutaneous TID WC  ? insulin glargine-yfgn  8 Units Subcutaneous Daily  ? methocarbamol  500 mg Oral BID  ? metoprolol succinate  100 mg Oral BID  ? potassium chloride  10 mEq Oral Daily  ? Rivaroxaban  15 mg Oral Q supper  ? senna  1 tablet Oral BID  ? tamsulosin  0.4 mg Oral Daily  ? ?Continuous Infusions: ? ?PRN Meds: ?albuterol, dextromethorphan-guaiFENesin, guaiFENesin, hydrALAZINE, ipratropium-albuterol, metoprolol tartrate, oxyCODONE, senna-docusate, traZODone  ? ?Vital Signs  ?  ?Vitals:  ? 09/03/21 2013 09/03/21 2145 09/04/21 0500 09/04/21 0543  ?BP: (!) 107/53   107/78  ?Pulse: 84   74  ?Resp: 17   20  ?Temp: (!) 97.4 ?F (36.3 ?C)   98.3 ?F (36.8 ?C)  ?TempSrc: Oral   Oral  ?SpO2: 97% 96%  99%  ?Weight:   75.5 kg   ?Height:      ? ? ?Intake/Output Summary (Last 24 hours) at 09/04/2021 0838 ?Last data filed at 09/04/2021 0435 ?Gross per 24 hour  ?Intake 780 ml  ?Output 823 ml  ?Net -43 ml  ? ? ?  09/04/2021  ?  5:00 AM 09/03/2021  ?  5:00 AM 09/02/2021  ? 12:54 PM  ?Last 3 Weights  ?Weight (lbs) 166 lb 7.2 oz 168 lb 6.9 oz 167 lb  ?Weight (kg) 75.5 kg 76.4 kg 75.751 kg  ?   ? ?Telemetry  ?  ?Atrial Fibrillation, HR predominantly in the 60s-70s. 2 episodes of 10-12 beats of NSVT - Personally Reviewed ? ?ECG  ?  ?No new tracings since 4/12 - Personally  Reviewed ? ?Physical Exam  ? ?GEN: No acute distress.  Sitting comfortably in the bed eating breakfast  ?Neck: No JVD ?Cardiac: Irregular rate and rhythm, radial pulses 2+ bilaterally  ?Respiratory: Diminished breath sounds in lung bases, mildly increased WOB  ?GI: Soft, nontender, mildly distended  ?MS: 3+ pitting edema to the knees  ?Neuro:  Nonfocal  ?Psych: Normal affect  ? ?Labs  ?  ?High Sensitivity Troponin:   ?Recent Labs  ?Lab 08/27/21 ?2334 08/28/21 ?2376 09/02/21 ?1318 09/02/21 ?2118  ?TROPONINIHS '7 8 9 9  '$ ?   ?Chemistry ?Recent Labs  ?Lab 09/02/21 ?1318 09/03/21 ?0356 09/04/21 ?2831  ?NA 137 142 138  ?K 4.2 4.2 4.5  ?CL 93* 94* 95*  ?CO2 37* 38* 37*  ?GLUCOSE 144* 147* 129*  ?BUN 18 17 26*  ?CREATININE 0.44 0.69 0.80  ?CALCIUM 9.4 9.2 8.8*  ?MG  --  2.0 2.0  ?PROT 6.7 5.8* 5.4*  ?ALBUMIN 3.6 3.1* 2.9*  ?AST 18 13* 12*  ?ALT '7 7 7  '$ ?ALKPHOS 169* 145* 141*  ?BILITOT 0.5 0.5 0.7  ?GFRNONAA >60 >60 >60  ?ANIONGAP '7 10 6  '$ ?  ?Lipids No results for input(s): CHOL, TRIG, HDL,  LABVLDL, LDLCALC, CHOLHDL in the last 168 hours.  ?Hematology ?Recent Labs  ?Lab 09/02/21 ?1318 09/03/21 ?0356 09/04/21 ?3704  ?WBC 6.5 5.2 5.3  ?RBC 3.37* 3.11* 3.17*  ?HGB 10.0* 9.3* 9.4*  ?HCT 33.5* 30.6* 31.8*  ?MCV 99.4 98.4 100.3*  ?MCH 29.7 29.9 29.7  ?MCHC 29.9* 30.4 29.6*  ?RDW 17.2* 17.1* 17.2*  ?PLT 280 259 253  ? ?Thyroid No results for input(s): TSH, FREET4 in the last 168 hours.  ?BNP ?Recent Labs  ?Lab 09/02/21 ?1318  ?BNP 495.4*  ?  ?DDimer No results for input(s): DDIMER in the last 168 hours.  ? ?Radiology  ?  ?DG Chest 2 View ? ?Result Date: 09/02/2021 ?CLINICAL DATA:  Shortness of breath, increased abdominal and leg swelling, history pneumonia, CHF, diabetes mellitus, hypertension EXAM: CHEST - 2 VIEW COMPARISON:  08/27/2021 FINDINGS: Normal heart size for AP technique and scoliosis. Prominent central pulmonary arteries. Atherosclerotic calcification thoracic aorta. Bibasilar effusions and atelectasis. Minimal pulmonary  edema, improved. No pneumothorax. Dextroconvex thoracic scoliosis and osseous demineralization with note of a RIGHT shoulder prosthesis. IMPRESSION: Slightly improved pulmonary edema. Small bibasilar pleural effusions and bibasilar atelectasis. Aortic Atherosclerosis (ICD10-I70.0). Electronically Signed   By: Lavonia Dana M.D.   On: 09/02/2021 13:30   ? ?Cardiac Studies  ? ?Echocardiogram on 06/23/21  ? 1. Left ventricular ejection fraction, by estimation, is 55 to 60%. The  ?left ventricle has normal function. The left ventricle has no regional  ?wall motion abnormalities. Left ventricular diastolic parameters are  ?consistent with Grade II diastolic  ?dysfunction (pseudonormalization).  ? 2. Right ventricular systolic function is normal. The right ventricular  ?size is normal. There is moderately elevated pulmonary artery systolic  ?pressure. The estimated right ventricular systolic pressure is 88.8 mmHg.  ? 3. Left atrial size was moderately dilated.  ? 4. Right atrial size was moderately dilated.  ? 5. The mitral valve is normal in structure. Mild mitral valve  ?regurgitation. No evidence of mitral stenosis.  ? 6. Tricuspid valve regurgitation is moderate to severe.  ? 7. The aortic valve is tricuspid. There is mild calcification of the  ?aortic valve. Aortic valve regurgitation is not visualized. Aortic valve  ?sclerosis is present, with no evidence of aortic valve stenosis.  ? 8. The inferior vena cava is normal in size with <50% respiratory  ?variability, suggesting right atrial pressure of 8 mmHg.  ? ?Comparison(s): No significant change from prior study. Prior images  ?reviewed side by side.  ? ?Patient Profile  ?   ?88 y.o. female with a hx of chronic atrial fibrillation on Xarelto, chronic diastolic heart failure, aortic aneurysm with chronic descending dissection, aortic valve sclerosis, hypertension, hyperlipidemia, type 2 DM who was seen 09/03/2021 for the evaluation of CHF at the request of Dr.  Reesa Chew. ? ?Assessment & Plan  ?  ?Acute on Chronic Diastolic Heart Failure  ?- Patient presented complaining of new dyspnea on exertion, orthopnea, some PND, pedal edema. Most recent echocardiogram findings above. At  last office visit on 08/18/2021, patient had evidence of volume overload and BNP came back elevated in the 400s; therefore, Lasix was increased for 1 week.  Since then, she presented to the ED on 08/27/2021 for progressive shortness of breath. BNP was higher in the 600s and chest x-ray showed cardiomegaly with central pulmonary vascular congestion, small bilateral pleural effusions, and bibasilar infiltrates.  Given IV lasix with improvement.  ?- BNP this admission is elevated but improved to 495.4  ?- CXR this admission showed  interval improvement in pulmonary edema, small bibasilar pleural effusions and bibasilar atelectasis  ?- Oxygen requirement mildly improved after IV lasix, was on 5L via nasal cannula now on 4L  ?- Lower extremity edema more noticeable on left, patient did have lower extremity dopplers at SNF that were negative for DVT (and patient on Xarelto)  ?- Dry weight suspected to be approx 135 lbs, patient was 169 lbs on presentation. Down to 166 this AM  ?- On 40 mg lasix IV BID. Output at least 0.823 L urine overnight (output not recorded for yesterday 7A-7P)  ?- Strict I/Os, daily weights. Patient understand fluid restriction   ?- Continue metoprolol  ?  ?Chronic Atrial Fibrillation  ?- Per telemetry, patient remains in atrial fibrillation with HR in the 70s-90s, occasional 1.5-2 second pauses   ?- Continue home diltiazem 360 mg daily, digoxin 0.0625 mg daily, metoprolol succinate 100 mg BID  ?- Continue home Xarelto 15 mg daily. Keep in mind, patient has had multiple falls recently and may need to consider stopping anticoagulation if she has another fall due to bleeding risk. Patient's CHA2DS2-VASc = 7 (CHF, HTN, DM, vascular disease, female, age x2).  ? ?NSVT  ?- Patient had 2 episodes  of NSVT on telemetry overnight, was asymptomatic  ?- Maintain K>4, mag >2  ?- Continue metoprolol 100 mg BID  ?  ?Ascending aortic aneurysm  ?- CTA in 2018 showed progressive predominantly thrombosed descending aortic dissec

## 2021-09-04 NOTE — Progress Notes (Signed)
Echocardiogram ?2D Echocardiogram has been performed. ? ?Tara Bradley ?09/04/2021, 2:33 PM ?

## 2021-09-04 NOTE — Plan of Care (Signed)
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
## 2021-09-04 NOTE — Plan of Care (Signed)
  Problem: Education: Goal: Knowledge of General Education information will improve Description: Including pain rating scale, medication(s)/side effects and non-pharmacologic comfort measures Outcome: Progressing   Problem: Nutrition: Goal: Adequate nutrition will be maintained Outcome: Progressing   Problem: Coping: Goal: Level of anxiety will decrease Outcome: Progressing   

## 2021-09-04 NOTE — Progress Notes (Signed)
?PROGRESS NOTE ? ? ? ?Tara Bradley  UVO:[id number] DOB: 1922-08-06 DOA: 09/02/2021 ?PCP: [name], MD  ? ?Brief Narrative:  ? 88 y.o. female with medical history significant of past medical history of chronic atrial fibrillation on Xarelto, diastolic CHF ef 74% Q5ZD, DM 2, HLD, HTN, osteoporosis, thoracic aortic aneurysm, DM 2 comes to the hospital from cardiology office for evaluation of shortness of breath.  Patient was diagnosed CHF exacerbation and started on diuretics.  Cardiology team was consulted. ? ? ?Assessment & Plan: ? Principal Problem: ?  Acute exacerbation of CHF (congestive heart failure) ([place]) ?Active Problems: ?  Essential hypertension ?  Osteoarthritis ?  Aortic valve sclerosis ?  Hyperlipidemia ?  Diabetes mellitus, type 2 ([place]) ?  Chronic atrial fibrillation ([facility]) ?  ? ?Acute diastolic congestive heart failure, EF 55%, class IV. g2DD ?Acute respiratory distress with tachypnea saturating 90% on 2 L nasal cannula ?- Patient's tachypnea is slightly better but still requiring 3 L of nasal cannula, has abnormal bilateral breath sounds as well.  Continue Lasix 40 mg IV twice daily.  Cardiology consulted ?  ?Chronic atrial fibrillation ?- On home Cardizem, Toprol-XL twice daily and digoxin.  Continue Xarelto. ?  ?Diabetes mellitus type 2 ?- Holding home metformin.  Sliding scale and Accu-Cheks. ?-Increase Semglee 10 units daily ?  ?Peripheral neuropathy secondary to DM2 ?- Continue gabapentin ?  ?Anemia of chronic disease ?- Stable hemoglobin ?  ?Ascending aortic aneurysm ?- Stable previously seen by CT surgery.  No further follow-up needed ?  ?Essential hypertension ?- On Cardizem and Toprol-XL.  IV Lopressor and hydralazine as needed added. ?  ?Hyperlipidemia ?- Not on statin ?  ?Recent hip and shoulder fracture ?- Status post intramedullary nailing on 08/02/2021.  PT/OT ? ? ?DVT prophylaxis: On Xarelto ?Code Status: DNR ?Family Communication:   ? ?Status is: Inpatient ?Remains inpatient  appropriate because: Patient requires aggressive IV diuretics while monitoring renal function.  She is also tachypneic requiring supplemental oxygen.  Not on home oxygen.  At this time I anticipate she will be in the hospital least for the next 3-5 days. ? ? ? ?Subjective: ?Shortness of breath is slightly improved compared to yesterday but nowhere close to baseline.  Still gets dyspneic with minimal mobility. ?Examination: ? ?General exam: Appears calm and comfortable, 4 L nasal cannula ?Respiratory system: Bibasilar crackles ?Cardiovascular system: S1 & S2 heard, RRR. No JVD, murmurs, rubs, gallops or clicks.  3+ bilateral lower extremity pitting edema ?Gastrointestinal system: Abdomen is nondistended, soft and nontender. No organomegaly or masses felt. Normal bowel sounds heard. ?Central nervous system: Alert and oriented. No focal neurological deficits. ?Extremities: Symmetric 5 x 5 power. ?Skin: No rashes, lesions or ulcers ?Psychiatry: Judgement and insight appear normal. Mood & affect appropriate.  ? ? ? ?Objective: ?Vitals:  ? 09/03/21 2145 09/04/21 0500 09/04/21 0543 09/04/21 0848  ?BP:   107/78 120/77  ?Pulse:   74 82  ?Resp:   20   ?Temp:   98.3 ?F (36.8 ?C)   ?TempSrc:   Oral   ?SpO2: 96%  99%   ?Weight:  75.5 kg    ?Height:      ? ? ?Intake/Output Summary (Last 24 hours) at 09/04/2021 1109 ?Last data filed at 09/04/2021 0900 ?Gross per 24 hour  ?Intake 660 ml  ?Output 823 ml  ?Net -163 ml  ? ?Filed Weights  ? 09/02/21 1254 09/03/21 0500 09/04/21 0500  ?Weight: 75.8 kg 76.4 kg 75.5 kg  ? ? ? ?  Data Reviewed:  ? ?CBC: ?Recent Labs  ?Lab 09/02/21 ?1318 09/03/21 ?0356 09/04/21 ?0174  ?WBC 6.5 5.2 5.3  ?NEUTROABS 4.8  --   --   ?HGB 10.0* 9.3* 9.4*  ?HCT 33.5* 30.6* 31.8*  ?MCV 99.4 98.4 100.3*  ?PLT 280 259 253  ? ?Basic Metabolic Panel: ?Recent Labs  ?Lab 09/02/21 ?1318 09/03/21 ?0356 09/04/21 ?9449  ?NA 137 142 138  ?K 4.2 4.2 4.5  ?CL 93* 94* 95*  ?CO2 37* 38* 37*  ?GLUCOSE 144* 147* 129*  ?BUN 18 17 26*   ?CREATININE 0.44 0.69 0.80  ?CALCIUM 9.4 9.2 8.8*  ?MG  --  2.0 2.0  ? ?GFR: ?Estimated Creatinine Clearance: 41.3 mL/min (by C-G formula based on SCr of 0.8 mg/dL). ?Liver Function Tests: ?Recent Labs  ?Lab 09/02/21 ?1318 09/03/21 ?0356 09/04/21 ?6759  ?AST 18 13* 12*  ?ALT '7 7 7  '$ ?ALKPHOS 169* 145* 141*  ?BILITOT 0.5 0.5 0.7  ?PROT 6.7 5.8* 5.4*  ?ALBUMIN 3.6 3.1* 2.9*  ? ?No results for input(s): LIPASE, AMYLASE in the last 168 hours. ?No results for input(s): AMMONIA in the last 168 hours. ?Coagulation Profile: ?No results for input(s): INR, PROTIME in the last 168 hours. ?Cardiac Enzymes: ?No results for input(s): CKTOTAL, CKMB, CKMBINDEX, TROPONINI in the last 168 hours. ?BNP (last 3 results) ?No results for input(s): PROBNP in the last 8760 hours. ?HbA1C: ?No results for input(s): HGBA1C in the last 72 hours. ?CBG: ?Recent Labs  ?Lab 09/03/21 ?1638 09/03/21 ?1201 09/03/21 ?1641 09/03/21 ?2113 09/04/21 ?4665  ?GLUCAP 137* 234* 215* 209* 132*  ? ?Lipid Profile: ?No results for input(s): CHOL, HDL, LDLCALC, TRIG, CHOLHDL, LDLDIRECT in the last 72 hours. ?Thyroid Function Tests: ?No results for input(s): TSH, T4TOTAL, FREET4, T3FREE, THYROIDAB in the last 72 hours. ?Anemia Panel: ?No results for input(s): VITAMINB12, FOLATE, FERRITIN, TIBC, IRON, RETICCTPCT in the last 72 hours. ?Sepsis Labs: ?No results for input(s): PROCALCITON, LATICACIDVEN in the last 168 hours. ? ? ?Recent Results (from the past 240 hour(s))  ?Resp Panel by RT-PCR (Flu A&B, Covid) Nasopharyngeal Swab     Status: None  ? Collection Time: 08/27/21 10:42 PM  ? Specimen: Nasopharyngeal Swab; Nasopharyngeal(NP) swabs in vial transport medium  ?Result Value Ref Range Status  ? SARS Coronavirus 2 by RT PCR NEGATIVE NEGATIVE Final  ?  Comment: (NOTE) ?SARS-CoV-2 target nucleic acids are NOT DETECTED. ? ?The SARS-CoV-2 RNA is generally detectable in upper respiratory ?specimens during the acute phase of infection. The lowest ?concentration of  SARS-CoV-2 viral copies this assay can detect is ?138 copies/mL. A negative result does not preclude SARS-Cov-2 ?infection and should not be used as the sole basis for treatment or ?other patient management decisions. A negative result may occur with  ?improper specimen collection/handling, submission of specimen other ?than nasopharyngeal swab, presence of viral mutation(s) within the ?areas targeted by this assay, and inadequate number of viral ?copies(<138 copies/mL). A negative result must be combined with ?clinical observations, patient history, and epidemiological ?information. The expected result is Negative. ? ?Fact Sheet for Patients:  ?EntrepreneurPulse.com.au ? ?Fact Sheet for Healthcare Providers:  ?IncredibleEmployment.be ? ?This test is no t yet approved or cleared by the [place] FDA and  ?has been authorized for detection and/or diagnosis of SARS-CoV-2 by ?FDA under an Emergency Use Authorization (EUA). This EUA will remain  ?in effect (meaning this test can be used) for the duration of the ?COVID-19 declaration under Section 564(b)(1) of the Act, 21 ?U.S.C.section 360bbb-3(b)(1), unless the authorization is  terminated  ?or revoked sooner.  ? ? ?  ? Influenza A by PCR NEGATIVE NEGATIVE Final  ? Influenza B by PCR NEGATIVE NEGATIVE Final  ?  Comment: (NOTE) ?The Xpert Xpress SARS-CoV-2/FLU/RSV plus assay is intended as an aid ?in the diagnosis of influenza from Nasopharyngeal swab specimens and ?should not be used as a sole basis for treatment. Nasal washings and ?aspirates are unacceptable for Xpert Xpress SARS-CoV-2/FLU/RSV ?testing. ? ?Fact Sheet for Patients: ?EntrepreneurPulse.com.au ? ?Fact Sheet for Healthcare Providers: ?IncredibleEmployment.be ? ?This test is not yet approved or cleared by the [place] FDA and ?has been authorized for detection and/or diagnosis of SARS-CoV-2 by ?FDA under an Emergency Use  Authorization (EUA). This EUA will remain ?in effect (meaning this test can be used) for the duration of the ?COVID-19 declaration under Section 564(b)(1) of the Act, 21 U.S.C. ?section 360bbb-3(b)(1), unless the authoriza

## 2021-09-05 DIAGNOSIS — I482 Chronic atrial fibrillation, unspecified: Secondary | ICD-10-CM | POA: Diagnosis not present

## 2021-09-05 DIAGNOSIS — I358 Other nonrheumatic aortic valve disorders: Secondary | ICD-10-CM | POA: Diagnosis not present

## 2021-09-05 DIAGNOSIS — I5033 Acute on chronic diastolic (congestive) heart failure: Secondary | ICD-10-CM | POA: Diagnosis not present

## 2021-09-05 DIAGNOSIS — E119 Type 2 diabetes mellitus without complications: Secondary | ICD-10-CM | POA: Diagnosis not present

## 2021-09-05 LAB — COMPREHENSIVE METABOLIC PANEL
ALT: 6 U/L (ref 0–44)
AST: 12 U/L — ABNORMAL LOW (ref 15–41)
Albumin: 3.2 g/dL — ABNORMAL LOW (ref 3.5–5.0)
Alkaline Phosphatase: 144 U/L — ABNORMAL HIGH (ref 38–126)
Anion gap: 6 (ref 5–15)
BUN: 25 mg/dL — ABNORMAL HIGH (ref 8–23)
CO2: 42 mmol/L — ABNORMAL HIGH (ref 22–32)
Calcium: 8.6 mg/dL — ABNORMAL LOW (ref 8.9–10.3)
Chloride: 90 mmol/L — ABNORMAL LOW (ref 98–111)
Creatinine, Ser: 0.81 mg/dL (ref 0.44–1.00)
GFR, Estimated: >60 mL/min (ref >60)
Glucose, Bld: 123 mg/dL — ABNORMAL HIGH (ref 70–99)
Potassium: 3.9 mmol/L (ref 3.5–5.1)
Sodium: 138 mmol/L (ref 135–145)
Total Bilirubin: 0.7 mg/dL (ref 0.3–1.2)
Total Protein: 5.9 g/dL — ABNORMAL LOW (ref 6.5–8.1)

## 2021-09-05 LAB — CBC
HCT: 31.6 % — ABNORMAL LOW (ref 36.0–46.0)
Hemoglobin: 9.5 g/dL — ABNORMAL LOW (ref 12.0–15.0)
MCH: 29.9 pg (ref 26.0–34.0)
MCHC: 30.1 g/dL (ref 30.0–36.0)
MCV: 99.4 fL (ref 80.0–100.0)
Platelets: 283 10*3/uL (ref 150–400)
RBC: 3.18 MIL/uL — ABNORMAL LOW (ref 3.87–5.11)
RDW: 17 % — ABNORMAL HIGH (ref 11.5–15.5)
WBC: 6 10*3/uL (ref 4.0–10.5)
nRBC: 0 % (ref 0.0–0.2)

## 2021-09-05 LAB — GLUCOSE, CAPILLARY
Glucose-Capillary: 102 mg/dL — ABNORMAL HIGH (ref 70–99)
Glucose-Capillary: 121 mg/dL — ABNORMAL HIGH (ref 70–99)
Glucose-Capillary: 196 mg/dL — ABNORMAL HIGH (ref 70–99)
Glucose-Capillary: 212 mg/dL — ABNORMAL HIGH (ref 70–99)

## 2021-09-05 LAB — MAGNESIUM: Magnesium: 1.9 mg/dL (ref 1.7–2.4)

## 2021-09-05 MED ORDER — ORAL CARE MOUTH RINSE
15.0000 mL | Freq: Two times a day (BID) | OROMUCOSAL | Status: DC
  Administered 2021-09-05 – 2021-09-16 (×22): 15 mL via OROMUCOSAL

## 2021-09-05 NOTE — Progress Notes (Signed)
Pt up to chair decreased O2 to 5 liters, O2 sat remained 94-96%, moving bed to chair, HR 120-130's with dyspnea on exertion noted. Recovered after 5 minutes, HR 115 O2 sat 100%, on 5 liters, will continue to monitor and attempt to wean O2 as needed. SRP, RN ?

## 2021-09-05 NOTE — Plan of Care (Signed)
  Problem: Education: Goal: Ability to demonstrate management of disease process will improve Outcome: Progressing Goal: Ability to verbalize understanding of medication therapies will improve Outcome: Progressing Goal: Individualized Educational Video(s) Outcome: Progressing   Problem: Activity: Goal: Capacity to carry out activities will improve Outcome: Progressing   

## 2021-09-05 NOTE — Progress Notes (Signed)
? ?Progress Note ? ?Patient Name: Tara Bradley ?Date of Encounter: 09/05/2021 ? ?[facility] Cardiologist: [name], MD  ? ?Subjective  ? ?Feeling better now with adequate diuresis. Has not been up yet.  ? ?Inpatient Medications  ?  ?Scheduled Meds: ? digoxin  0.0625 mg Oral Daily  ? diltiazem  360 mg Oral Daily  ? feeding supplement  1 Container Oral TID BM  ? ferrous gluconate  324 mg Oral BID WC  ? furosemide  80 mg Intravenous BID  ? gabapentin  100 mg Oral TID  ? insulin aspart  0-9 Units Subcutaneous TID WC  ? insulin glargine-yfgn  10 Units Subcutaneous Daily  ? mouth rinse  15 mL Mouth Rinse BID  ? methocarbamol  500 mg Oral BID  ? metoprolol succinate  100 mg Oral BID  ? potassium chloride  10 mEq Oral Daily  ? Rivaroxaban  15 mg Oral Q supper  ? senna  1 tablet Oral BID  ? tamsulosin  0.4 mg Oral Daily  ? ?Continuous Infusions: ? ?PRN Meds: ?albuterol, dextromethorphan-guaiFENesin, guaiFENesin, hydrALAZINE, ipratropium-albuterol, metoprolol tartrate, oxyCODONE, senna-docusate, traZODone  ? ?Vital Signs  ?  ?Vitals:  ? 09/04/21 2126 09/05/21 0419 09/05/21 0458 09/05/21 0838  ?BP: 126/89 121/65  117/76  ?Pulse: 75 81  96  ?Resp:  (!) 22    ?Temp:  99.2 ?F (37.3 ?C)    ?TempSrc:  Oral    ?SpO2:  100%  96%  ?Weight:   75.6 kg   ?Height:      ? ? ?Intake/Output Summary (Last 24 hours) at 09/05/2021 0929 ?Last data filed at 09/05/2021 0825 ?Gross per 24 hour  ?Intake 1657 ml  ?Output 1750 ml  ?Net -93 ml  ? ? ?  09/05/2021  ?  4:58 AM 09/04/2021  ?  5:00 AM 09/03/2021  ?  5:00 AM  ?Last 3 Weights  ?Weight (lbs) 166 lb 10.7 oz 166 lb 7.2 oz 168 lb 6.9 oz  ?Weight (kg) 75.6 kg 75.5 kg 76.4 kg  ?   ? ?Telemetry  ?  ?Atrial Fibrillation, HR predominantly in the 60s-70s.  - Personally Reviewed ? ?ECG  ?  ?No new tracings since 4/12 - Personally Reviewed ? ?Physical Exam  ? ?GEN: No acute distress.  Sitting comfortably in the bed  ?Neck: JVD to lower 1/3 of neck ?Cardiac: Irregular rhythm, normal  rate ?Respiratory: Diminished breath sounds in lung bases, crackles. No increased WOB sitting.  ?GI: Soft, nontender, mildly distended  ?MS: 1-2+ pitting edema to the knees, L>R ?Neuro:  Nonfocal  ?Psych: Normal affect  ? ?Labs  ?  ?High Sensitivity Troponin:   ?Recent Labs  ?Lab 08/27/21 ?2334 08/28/21 ?6203 09/02/21 ?1318 09/02/21 ?2118  ?TROPONINIHS '7 8 9 9  '$ ?   ?Chemistry ?Recent Labs  ?Lab 09/03/21 ?0356 09/04/21 ?0355 09/05/21 ?0403  ?NA 142 138 138  ?K 4.2 4.5 3.9  ?CL 94* 95* 90*  ?CO2 38* 37* 42*  ?GLUCOSE 147* 129* 123*  ?BUN 17 26* 25*  ?CREATININE 0.69 0.80 0.81  ?CALCIUM 9.2 8.8* 8.6*  ?MG 2.0 2.0 1.9  ?PROT 5.8* 5.4* 5.9*  ?ALBUMIN 3.1* 2.9* 3.2*  ?AST 13* 12* 12*  ?ALT '7 7 6  '$ ?ALKPHOS 145* 141* 144*  ?BILITOT 0.5 0.7 0.7  ?GFRNONAA >60 >60 >60  ?ANIONGAP '10 6 6  '$ ?  ?Lipids No results for input(s): CHOL, TRIG, HDL, LABVLDL, LDLCALC, CHOLHDL in the last 168 hours.  ?Hematology ?Recent Labs  ?Lab 09/03/21 ?[phone]  09/04/21 ?0355 09/05/21 ?0403  ?WBC 5.2 5.3 6.0  ?RBC 3.11* 3.17* 3.18*  ?HGB 9.3* 9.4* 9.5*  ?HCT 30.6* 31.8* 31.6*  ?MCV 98.4 100.3* 99.4  ?MCH 29.9 29.7 29.9  ?MCHC 30.4 29.6* 30.1  ?RDW 17.1* 17.2* 17.0*  ?PLT 259 253 283  ? ?Thyroid No results for input(s): TSH, FREET4 in the last 168 hours.  ?BNP ?Recent Labs  ?Lab 09/02/21 ?1318  ?BNP 495.4*  ?  ?DDimer  ?Recent Labs  ?Lab 09/04/21 ?1603  ?DDIMER 2.89*  ?  ? ?Radiology  ?  ?ECHOCARDIOGRAM LIMITED ? ?Result Date: 09/04/2021 ?   ECHOCARDIOGRAM LIMITED REPORT   Patient Name:   Tara Bradley Date of Exam: 09/04/2021 Medical Rec #:  [id number]        Height:       65.5 in Accession #:    [id number]       Weight:       166.4 lb Date of Birth:  1922/07/06         BSA:          1.840 m? Patient Age:    87 years         BP:           105/51 mmHg Patient Gender: F                HR:           76 bpm. Exam Location:  Inpatient Procedure: Limited Echo, Limited Color Doppler and Cardiac Doppler Indications:    Chest pain  History:        Patient has prior  history of Echocardiogram examinations, most                 recent 06/23/2021. Arrythmias:Atrial Fibrillation,                 Signs/Symptoms:Shortness of Breath; Risk Factors:Diabetes,                 Dyslipidemia and Hypertension.  Sonographer:    Arlyss Gandy Referring Phys: [id number] [place]  1. Left ventricular ejection fraction, by estimation, is 60 to 65%. The left ventricle has normal function. The left ventricle has no regional wall motion abnormalities. Left ventricular diastolic function could not be evaluated.  2. Right ventricular systolic function is mildly reduced. The right ventricular size is moderately enlarged. There is moderately elevated pulmonary artery systolic pressure. The estimated right ventricular systolic pressure is 34.1 mmHg.  3. Left atrial size was moderately dilated.  4. Right atrial size was severely dilated.  5. Large pleural effusion in the left lateral region.  6. Tricuspid valve regurgitation is moderate.  7. The inferior vena cava is normal in size with greater than 50% respiratory variability, suggesting right atrial pressure of 3 mmHg. FINDINGS  Left Ventricle: Left ventricular ejection fraction, by estimation, is 60 to 65%. The left ventricle has normal function. The left ventricle has no regional wall motion abnormalities. The left ventricular internal cavity size was normal in size. Left ventricular diastolic function could not be evaluated. Left ventricular diastolic function could not be evaluated due to atrial fibrillation. Right Ventricle: The right ventricular size is moderately enlarged. Right ventricular systolic function is mildly reduced. There is moderately elevated pulmonary artery systolic pressure. The tricuspid regurgitant velocity is 3.57 m/s, and with an assumed right atrial pressure of 8 mmHg, the estimated right ventricular systolic pressure is 96.2 mmHg. Left Atrium: Left atrial size was moderately dilated. Right  Atrium: Right  atrial size was severely dilated. Tricuspid Valve: The tricuspid valve is normal in structure. Tricuspid valve regurgitation is moderate. Aortic Valve: Aortic valve mean gradient measures 6.0 mmHg. Aortic valve peak gradient measures 10.6 mmHg. Aortic valve area, by VTI measures 1.49 cm?. Venous: The inferior vena cava is normal in size with greater than 50% respiratory variability, suggesting right atrial pressure of 3 mmHg. Additional Comments: There is a large pleural effusion in the left lateral region. LEFT VENTRICLE PLAX 2D LVIDd:         3.90 cm   Diastology LVIDs:         2.80 cm   LV e' medial:    6.75 cm/s LV PW:         0.90 cm   LV E/e' medial:  20.1 LV IVS:        0.90 cm   LV e' lateral:   10.90 cm/s LVOT diam:     2.00 cm   LV E/e' lateral: 12.5 LV SV:         49 LV SV Index:   26 LVOT Area:     3.14 cm?  RIGHT VENTRICLE            IVC RV Basal diam:  4.80 cm    IVC diam: 1.30 cm RV Mid diam:    4.20 cm RV S prime:     7.51 cm/s TAPSE (M-mode): 1.6 cm LEFT ATRIUM             Index        RIGHT ATRIUM           Index LA Vol (A2C):   74.9 ml 40.71 ml/m?  RA Area:     30.80 cm? LA Vol (A4C):   83.5 ml 45.38 ml/m?  RA Volume:   124.00 ml 67.39 ml/m? LA Biplane Vol: 83.6 ml 45.43 ml/m?  AORTIC VALVE AV Area (Vmax):    1.26 cm? AV Area (Vmean):   1.18 cm? AV Area (VTI):     1.49 cm? AV Vmax:           163.00 cm/s AV Vmean:          116.000 cm/s AV VTI:            0.326 m AV Peak Grad:      10.6 mmHg AV Mean Grad:      6.0 mmHg LVOT Vmax:         65.50 cm/s LVOT Vmean:        43.700 cm/s LVOT VTI:          0.155 m LVOT/AV VTI ratio: 0.48 MITRAL VALVE                TRICUSPID VALVE MV Area (PHT): 4.49 cm?     TR Peak grad:   51.0 mmHg MV Decel Time: 169 msec     TR Vmax:        357.00 cm/s MV E velocity: 136.00 cm/s                             SHUNTS                             Systemic VTI:  0.16 m  Systemic Diam: 2.00 cm Mertie Moores MD Electronically signed by Mertie Moores MD  Signature Date/Time: 09/04/2021/4:05:45 PM    Final    ? ?Cardiac Studies  ? ?Echocardiogram on 06/23/21  ? 1. Left ventricular ejection fraction, by estimation, is 55 to 60%. The  ?left ventricle has normal function. T

## 2021-09-05 NOTE — Progress Notes (Signed)
Occupational Therapy Treatment ?Patient Details ?Name: Tara Bradley ?MRN: [id number] ?DOB: 1922-09-18 ?Today's Date: 09/05/2021 ? ? ?History of present illness Tara Bradley is a 88 y.o. female with medical history significant of past medical history of chronic atrial fibrillation on Xarelto, diastolic CHF ef 66% A6TK, DM 2, HLD, HTN, osteoporosis, thoracic aortic aneurysm, DM 2 comes to the hospital from cardiology office for evaluation of shortness of breath.BNP is elevated, chest x-ray suggestive of pulmonary edema/effusion ?  ?OT comments ? Patient was just up to chair with nursing at start of session. Patient participated in UB bathing and grooming tasks seated off the back of recliner in room with increased time and education on deep breathing during tasks to reduce SOB. Patient's O2 on 5L/min was 97% to 100% with HR noted to be 98 to 127bpm during session. Nurse made aware. Patient would need 24/7 caregiver physical assistance in next level of care to be successful. Patient would continue to benefit from skilled OT services at this time while admitted and after d/c to address noted deficits in order to improve overall safety and independence in ADLs.  ?  ? ?Recommendations for follow up therapy are one component of a multi-disciplinary discharge planning process, led by the attending physician.  Recommendations may be updated based on patient status, additional functional criteria and insurance authorization. ?   ?Follow Up Recommendations ? Home health OT (if able to higher 24/7 caregiver support.)  ?  ?Assistance Recommended at Discharge Frequent or constant Supervision/Assistance  ?Patient can return home with the following ? A little help with walking and/or transfers;A lot of help with bathing/dressing/bathroom;Assistance with cooking/housework;Help with stairs or ramp for entrance;Assist for transportation ?  ?Equipment Recommendations ? None recommended by OT  ?  ?Recommendations for Other Services    ? ?  ?Precautions / Restrictions Precautions ?Precautions: Fall ?Precaution Comments: SOB, monitor O2 and HR ?Restrictions ?Weight Bearing Restrictions: No  ? ? ?  ? ?Mobility Bed Mobility ?  ?  ?  ?  ?  ?  ?  ?General bed mobility comments: seated in recliner at start of session and remained in the same ?  ? ?Transfers ?  ?  ?  ?  ?  ?  ?  ?  ?  ?  ?  ?  ?Balance   ?  ?  ?  ?  ?  ?  ?  ?  ?  ?  ?  ?  ?  ?  ?  ?  ?  ?  ?   ? ?ADL either performed or assessed with clinical judgement  ? ?ADL Overall ADL's : Needs assistance/impaired ?  ?  ?Grooming: Set up;Sitting;Oral care;Wash/dry face;Brushing hair ?Grooming Details (indicate cue type and reason): in recliner with HR noted to increase to 113 to 120s with O2 96% to 100% on 5L/min with SOB noted. ?Upper Body Bathing: Sitting;Set up ?Upper Body Bathing Details (indicate cue type and reason): in recliner ?  ?  ?Upper Body Dressing : Minimal assistance;Sitting ?Upper Body Dressing Details (indicate cue type and reason): in recliner with increased time and cues to maintain deep breathing during activity. ?  ?  ?  ?  ?  ?  ?  ?  ?  ?General ADL Comments: Patient primarily limited by cardiopulmonary status resulting in decreased endurance, safety, and independence with self care. ?  ? ?Extremity/Trunk Assessment   ?  ?  ?  ?  ?  ? ?Vision   ?  ?  ?  Perception   ?  ?Praxis   ?  ? ?Cognition Arousal/Alertness: Awake/alert ?Behavior During Therapy: [facility] for tasks assessed/performed ?Overall Cognitive Status: Within Functional Limits for tasks assessed ?  ?  ?  ?  ?  ?  ?  ?  ?  ?  ?  ?  ?  ?  ?  ?  ?  ?  ?  ?   ?Exercises   ? ?  ?Shoulder Instructions   ? ? ?  ?General Comments    ? ? ?Pertinent Vitals/ Pain       Pain Assessment ?Pain Assessment: No/denies pain ? ?Home Living   ?  ?  ?  ?  ?  ?  ?  ?  ?  ?  ?  ?  ?  ?  ?  ?  ?  ?  ? ?  ?Prior Functioning/Environment    ?  ?  ?  ?   ? ?Frequency ? Min 2X/week  ? ? ? ? ?  ?Progress Toward Goals ? ?OT Goals(current goals can now  be found in the care plan section) ? Progress towards OT goals: Progressing toward goals ? ?   ?Plan Discharge plan remains appropriate   ? ?Co-evaluation ? ? ?   ?  ?  ?  ?  ? ?  ?AM-PAC OT "6 Clicks" Daily Activity     ?Outcome Measure ? ? Help from another person eating meals?: None ?Help from another person taking care of personal grooming?: A Little ?Help from another person toileting, which includes using toliet, bedpan, or urinal?: A Lot ?Help from another person bathing (including washing, rinsing, drying)?: A Lot ?Help from another person to put on and taking off regular upper body clothing?: A Little ?Help from another person to put on and taking off regular lower body clothing?: A Lot ?6 Click Score: 16 ? ?  ?End of Session Equipment Utilized During Treatment: Oxygen ? ?OT Visit Diagnosis: Unsteadiness on feet (R26.81);Other abnormalities of gait and mobility (R26.89);Muscle weakness (generalized) (M62.81) ?  ?Activity Tolerance Patient limited by fatigue ?  ?Patient Left with call bell/phone within reach;with chair alarm set;in chair;with nursing/sitter in room ?  ?Nurse Communication Mobility status ?  ? ?   ? ?Time: 7341-9379 ?OT Time Calculation (min): 20 min ? ?Charges: OT General Charges ?$OT Visit: 1 Visit ?OT Treatments ?$Self Care/Home Management : 8-22 mins ? ?Berlynn Warsame OTR/L, MS ?Acute Rehabilitation Department ?Office# [phone] ?Pager# [phone] ? ? ? ?09/05/2021, 12:12 PM ?

## 2021-09-05 NOTE — Progress Notes (Signed)
?PROGRESS NOTE ? ? ? ?Tara Bradley  PJK:[id number] DOB: [birth year] DOA: 09/02/2021 ?PCP: [name], MD  ? ?Brief Narrative:  ? 88 y.o. female with medical history significant of past medical history of chronic atrial fibrillation on Xarelto, diastolic CHF ef 80% D9IP, DM 2, HLD, HTN, osteoporosis, thoracic aortic aneurysm, DM 2 comes to the hospital from cardiology office for evaluation of shortness of breath.  Patient was diagnosed CHF exacerbation and started on diuretics.  Cardiology team was consulted. ? ? ?Assessment & Plan: ? Principal Problem: ?  Acute exacerbation of CHF (congestive heart failure) ([place]) ?Active Problems: ?  Essential hypertension ?  Osteoarthritis ?  Aortic valve sclerosis ?  Hyperlipidemia ?  Diabetes mellitus, type 2 ([place]) ?  Chronic atrial fibrillation ([facility]) ?  ? ?Acute diastolic congestive heart failure, EF 55%, class IV. g2DD ?Acute respiratory distress with tachypnea saturating 90% on 2 L nasal cannula ?- Patient's tachypnea is slightly better but still requiring 3 L of nasal cannula, has abnormal bilateral breath sounds as well.  Continue Lasix IV twice daily.  Cardiology consulted ?  ?Chronic atrial fibrillation ?- On home Cardizem, Toprol-XL twice daily and digoxin.  Continue Xarelto. ?  ?Diabetes mellitus type 2 ?- Holding home metformin.  Sliding scale and Accu-Cheks. ?- Better controlled.  Continue Semglee 10 units daily. ?  ?Peripheral neuropathy secondary to DM2 ?- Continue gabapentin ?  ?Anemia of chronic disease ?- Stable hemoglobin ?  ?Ascending aortic aneurysm ?- Stable previously seen by CT surgery.  No further follow-up needed ?  ?Essential hypertension ?- On Cardizem and Toprol-XL.  IV Lopressor and hydralazine as needed added. ?  ?Hyperlipidemia ?- Not on statin ?  ?Recent hip and shoulder fracture ?- Status post intramedullary nailing on 08/02/2021.  PT/OT, mobilize as much as possible. ? ? ?DVT prophylaxis: On Xarelto ?Code Status: DNR ?Family Communication:    ? ?Status is: Inpatient ?Remains inpatient appropriate because: Patient requires aggressive IV diuretics while monitoring renal function.  She is also tachypneic requiring supplemental oxygen.  Not on home oxygen.  At this time I anticipate she will be in the hospital least for the next 3-5 days. ? ? ? ?Subjective: ?Shortness of breath continues to slowly improve.  Still has quite a bit of deconditioning and shortness of breath with ambulation and mobility requiring 5 L of nasal cannula and gets tachycardic. ?Examination: ? ?General exam: Appears calm and comfortable, 5 L nasal cannula ?Respiratory system: Bilateral crackles ?Cardiovascular system: S1 & S2 heard, RRR. No JVD, murmurs, rubs, gallops or clicks.  3+ bilateral lower extremity pitting edema ?Gastrointestinal system: Abdomen is nondistended, soft and nontender. No organomegaly or masses felt. Normal bowel sounds heard. ?Central nervous system: Alert and oriented. No focal neurological deficits. ?Extremities: Symmetric 5 x 5 power. ?Skin: No rashes, lesions or ulcers ?Psychiatry: Judgement and insight appear normal. Mood & affect appropriate.  ? ? ? ?Objective: ?Vitals:  ? 09/04/21 2126 09/05/21 0419 09/05/21 0458 09/05/21 0838  ?BP: 126/89 121/65  117/76  ?Pulse: 75 81  96  ?Resp:  (!) 22    ?Temp:  99.2 ?F (37.3 ?C)    ?TempSrc:  Oral    ?SpO2:  100%  96%  ?Weight:   75.6 kg   ?Height:      ? ? ?Intake/Output Summary (Last 24 hours) at 09/05/2021 1206 ?Last data filed at 09/05/2021 0825 ?Gross per 24 hour  ?Intake 1657 ml  ?Output 1750 ml  ?Net -93 ml  ? ?Filed Weights  ?  09/03/21 0500 09/04/21 0500 09/05/21 0458  ?Weight: 76.4 kg 75.5 kg 75.6 kg  ? ? ? ?Data Reviewed:  ? ?CBC: ?Recent Labs  ?Lab 09/02/21 ?1318 09/03/21 ?0356 09/04/21 ?0355 09/05/21 ?0403  ?WBC 6.5 5.2 5.3 6.0  ?NEUTROABS 4.8  --   --   --   ?HGB 10.0* 9.3* 9.4* 9.5*  ?HCT 33.5* 30.6* 31.8* 31.6*  ?MCV 99.4 98.4 100.3* 99.4  ?PLT 280 259 253 283  ? ?Basic Metabolic Panel: ?Recent Labs   ?Lab 09/02/21 ?1318 09/03/21 ?0356 09/04/21 ?0355 09/05/21 ?0403  ?NA 137 142 138 138  ?K 4.2 4.2 4.5 3.9  ?CL 93* 94* 95* 90*  ?CO2 37* 38* 37* 42*  ?GLUCOSE 144* 147* 129* 123*  ?BUN 18 17 26* 25*  ?CREATININE 0.44 0.69 0.80 0.81  ?CALCIUM 9.4 9.2 8.8* 8.6*  ?MG  --  2.0 2.0 1.9  ? ?GFR: ?Estimated Creatinine Clearance: 40.9 mL/min (by C-G formula based on SCr of 0.81 mg/dL). ?Liver Function Tests: ?Recent Labs  ?Lab 09/02/21 ?1318 09/03/21 ?0356 09/04/21 ?0355 09/05/21 ?0403  ?AST 18 13* 12* 12*  ?ALT '7 7 7 6  '$ ?ALKPHOS 169* 145* 141* 144*  ?BILITOT 0.5 0.5 0.7 0.7  ?PROT 6.7 5.8* 5.4* 5.9*  ?ALBUMIN 3.6 3.1* 2.9* 3.2*  ? ?No results for input(s): LIPASE, AMYLASE in the last 168 hours. ?No results for input(s): AMMONIA in the last 168 hours. ?Coagulation Profile: ?No results for input(s): INR, PROTIME in the last 168 hours. ?Cardiac Enzymes: ?No results for input(s): CKTOTAL, CKMB, CKMBINDEX, TROPONINI in the last 168 hours. ?BNP (last 3 results) ?No results for input(s): PROBNP in the last 8760 hours. ?HbA1C: ?No results for input(s): HGBA1C in the last 72 hours. ?CBG: ?Recent Labs  ?Lab 09/04/21 ?1117 09/04/21 ?1746 09/04/21 ?2046 09/05/21 ?0752 09/05/21 ?1128  ?GLUCAP 190* 176* 146* 102* 121*  ? ?Lipid Profile: ?No results for input(s): CHOL, HDL, LDLCALC, TRIG, CHOLHDL, LDLDIRECT in the last 72 hours. ?Thyroid Function Tests: ?No results for input(s): TSH, T4TOTAL, FREET4, T3FREE, THYROIDAB in the last 72 hours. ?Anemia Panel: ?No results for input(s): VITAMINB12, FOLATE, FERRITIN, TIBC, IRON, RETICCTPCT in the last 72 hours. ?Sepsis Labs: ?No results for input(s): PROCALCITON, LATICACIDVEN in the last 168 hours. ? ? ?Recent Results (from the past 240 hour(s))  ?Resp Panel by RT-PCR (Flu A&B, Covid) Nasopharyngeal Swab     Status: None  ? Collection Time: 08/27/21 10:42 PM  ? Specimen: Nasopharyngeal Swab; Nasopharyngeal(NP) swabs in vial transport medium  ?Result Value Ref Range Status  ? SARS Coronavirus 2  by RT PCR NEGATIVE NEGATIVE Final  ?  Comment: (NOTE) ?SARS-CoV-2 target nucleic acids are NOT DETECTED. ? ?The SARS-CoV-2 RNA is generally detectable in upper respiratory ?specimens during the acute phase of infection. The lowest ?concentration of SARS-CoV-2 viral copies this assay can detect is ?138 copies/mL. A negative result does not preclude SARS-Cov-2 ?infection and should not be used as the sole basis for treatment or ?other patient management decisions. A negative result may occur with  ?improper specimen collection/handling, submission of specimen other ?than nasopharyngeal swab, presence of viral mutation(s) within the ?areas targeted by this assay, and inadequate number of viral ?copies(<138 copies/mL). A negative result must be combined with ?clinical observations, patient history, and epidemiological ?information. The expected result is Negative. ? ?Fact Sheet for Patients:  ?EntrepreneurPulse.com.au ? ?Fact Sheet for Healthcare Providers:  ?IncredibleEmployment.be ? ?This test is no t yet approved or cleared by the [place] and  ?has been authorized  for detection and/or diagnosis of SARS-CoV-2 by ?FDA under an Emergency Use Authorization (EUA). This EUA will remain  ?in effect (meaning this test can be used) for the duration of the ?COVID-19 declaration under Section 564(b)(1) of the Act, 21 ?U.S.C.section 360bbb-3(b)(1), unless the authorization is terminated  ?or revoked sooner.  ? ? ?  ? Influenza A by PCR NEGATIVE NEGATIVE Final  ? Influenza B by PCR NEGATIVE NEGATIVE Final  ?  Comment: (NOTE) ?The Xpert Xpress SARS-CoV-2/FLU/RSV plus assay is intended as an aid ?in the diagnosis of influenza from Nasopharyngeal swab specimens and ?should not be used as a sole basis for treatment. Nasal washings and ?aspirates are unacceptable for Xpert Xpress SARS-CoV-2/FLU/RSV ?testing. ? ?Fact Sheet for Patients: ?EntrepreneurPulse.com.au ? ?Fact  Sheet for Healthcare Providers: ?IncredibleEmployment.be ? ?This test is not yet approved or cleared by the [place] FDA and ?has been authorized for detection and/or diagnosis of SARS-C

## 2021-09-05 NOTE — Plan of Care (Signed)

## 2021-09-06 DIAGNOSIS — E119 Type 2 diabetes mellitus without complications: Secondary | ICD-10-CM | POA: Diagnosis not present

## 2021-09-06 DIAGNOSIS — I482 Chronic atrial fibrillation, unspecified: Secondary | ICD-10-CM | POA: Diagnosis not present

## 2021-09-06 DIAGNOSIS — I5033 Acute on chronic diastolic (congestive) heart failure: Secondary | ICD-10-CM | POA: Diagnosis not present

## 2021-09-06 DIAGNOSIS — I358 Other nonrheumatic aortic valve disorders: Secondary | ICD-10-CM | POA: Diagnosis not present

## 2021-09-06 LAB — CBC
HCT: 31.6 % — ABNORMAL LOW (ref 36.0–46.0)
Hemoglobin: 9.5 g/dL — ABNORMAL LOW (ref 12.0–15.0)
MCH: 29.6 pg (ref 26.0–34.0)
MCHC: 30.1 g/dL (ref 30.0–36.0)
MCV: 98.4 fL (ref 80.0–100.0)
Platelets: 284 10*3/uL (ref 150–400)
RBC: 3.21 MIL/uL — ABNORMAL LOW (ref 3.87–5.11)
RDW: 16.8 % — ABNORMAL HIGH (ref 11.5–15.5)
WBC: 6.3 10*3/uL (ref 4.0–10.5)
nRBC: 0 % (ref 0.0–0.2)

## 2021-09-06 LAB — BASIC METABOLIC PANEL
Anion gap: 3 — ABNORMAL LOW (ref 5–15)
BUN: 22 mg/dL (ref 8–23)
CO2: 42 mmol/L — ABNORMAL HIGH (ref 22–32)
Calcium: 8.3 mg/dL — ABNORMAL LOW (ref 8.9–10.3)
Chloride: 90 mmol/L — ABNORMAL LOW (ref 98–111)
Creatinine, Ser: 0.61 mg/dL (ref 0.44–1.00)
GFR, Estimated: >60 mL/min (ref >60)
Glucose, Bld: 159 mg/dL — ABNORMAL HIGH (ref 70–99)
Potassium: 3.7 mmol/L (ref 3.5–5.1)
Sodium: 135 mmol/L (ref 135–145)

## 2021-09-06 LAB — GLUCOSE, CAPILLARY
Glucose-Capillary: 104 mg/dL — ABNORMAL HIGH (ref 70–99)
Glucose-Capillary: 236 mg/dL — ABNORMAL HIGH (ref 70–99)
Glucose-Capillary: 128 mg/dL — ABNORMAL HIGH (ref 70–99)
Glucose-Capillary: 243 mg/dL — ABNORMAL HIGH (ref 70–99)

## 2021-09-06 LAB — MAGNESIUM: Magnesium: 1.9 mg/dL (ref 1.7–2.4)

## 2021-09-06 NOTE — Plan of Care (Signed)

## 2021-09-06 NOTE — Progress Notes (Signed)
? ?Progress Note ? ?Patient Name: Tara Bradley ?Date of Encounter: 09/06/2021 ? ?[facility] Cardiologist: [name], MD  ? ?Subjective  ? ?Feeling better now with adequate diuresis. Has not been up yet today. ? ?Inpatient Medications  ?  ?Scheduled Meds: ? digoxin  0.0625 mg Oral Daily  ? diltiazem  360 mg Oral Daily  ? feeding supplement  1 Container Oral TID BM  ? ferrous gluconate  324 mg Oral BID WC  ? furosemide  80 mg Intravenous BID  ? gabapentin  100 mg Oral TID  ? insulin aspart  0-9 Units Subcutaneous TID WC  ? insulin glargine-yfgn  10 Units Subcutaneous Daily  ? mouth rinse  15 mL Mouth Rinse BID  ? methocarbamol  500 mg Oral BID  ? metoprolol succinate  100 mg Oral BID  ? potassium chloride  10 mEq Oral Daily  ? Rivaroxaban  15 mg Oral Q supper  ? senna  1 tablet Oral BID  ? tamsulosin  0.4 mg Oral Daily  ? ?Continuous Infusions: ? ?PRN Meds: ?albuterol, dextromethorphan-guaiFENesin, guaiFENesin, hydrALAZINE, ipratropium-albuterol, metoprolol tartrate, oxyCODONE, senna-docusate, traZODone  ? ?Vital Signs  ?  ?Vitals:  ? 09/05/21 2048 09/06/21 0420 09/06/21 0500 09/06/21 0820  ?BP: 124/90 119/83    ?Pulse: 98 66    ?Resp: (!) 22 (!) 22    ?Temp: 99 ?F (37.2 ?C) 97.8 ?F (36.6 ?C)    ?TempSrc: Oral Oral    ?SpO2: 100% 100%  99%  ?Weight:   75.4 kg   ?Height:      ? ? ?Intake/Output Summary (Last 24 hours) at 09/06/2021 1202 ?Last data filed at 09/06/2021 8119 ?Gross per 24 hour  ?Intake 920 ml  ?Output 2025 ml  ?Net -1105 ml  ? ? ?  09/06/2021  ?  5:00 AM 09/05/2021  ?  4:58 AM 09/04/2021  ?  5:00 AM  ?Last 3 Weights  ?Weight (lbs) 166 lb 3.6 oz 166 lb 10.7 oz 166 lb 7.2 oz  ?Weight (kg) 75.4 kg 75.6 kg 75.5 kg  ?   ? ?Telemetry  ?  ?Atrial Fibrillation, HR predominantly in the 60s-70s.  - Personally Reviewed ? ?ECG  ?  ?No new tracings since 4/12 - Personally Reviewed ? ?Physical Exam  ? ?GEN: No acute distress.  Sitting comfortably in the bed  ?Neck: no JVD ?Cardiac: Irregular rhythm, normal  rate ?Respiratory: Diminished breath sounds in lung bases, crackles. No increased WOB sitting.  ?GI: Soft, nontender, mildly distended  ?MS: 1+ pitting edema to the knees, L>R ?Neuro:  Nonfocal  ?Psych: Normal affect  ? ?Labs  ?  ?High Sensitivity Troponin:   ?Recent Labs  ?Lab 08/27/21 ?2334 08/28/21 ?1478 09/02/21 ?1318 09/02/21 ?2118  ?TROPONINIHS '7 8 9 9  '$ ?   ?Chemistry ?Recent Labs  ?Lab 09/03/21 ?0356 09/04/21 ?2956 09/05/21 ?0403 09/06/21 ?0345 09/06/21 ?2130  ?NA 142 138 138  --  135  ?K 4.2 4.5 3.9  --  3.7  ?CL 94* 95* 90*  --  90*  ?CO2 38* 37* 42*  --  42*  ?GLUCOSE 147* 129* 123*  --  159*  ?BUN 17 26* 25*  --  22  ?CREATININE 0.69 0.80 0.81  --  0.61  ?CALCIUM 9.2 8.8* 8.6*  --  8.3*  ?MG 2.0 2.0 1.9 1.9  --   ?PROT 5.8* 5.4* 5.9*  --   --   ?ALBUMIN 3.1* 2.9* 3.2*  --   --   ?AST 13* 12* 12*  --   --   ?  ALT '7 7 6  '$ --   --   ?ALKPHOS 145* 141* 144*  --   --   ?BILITOT 0.5 0.7 0.7  --   --   ?GFRNONAA >60 >60 >60  --  >60  ?ANIONGAP '10 6 6  '$ --  3*  ?  ?Lipids No results for input(s): CHOL, TRIG, HDL, LABVLDL, LDLCALC, CHOLHDL in the last 168 hours.  ?Hematology ?Recent Labs  ?Lab 09/04/21 ?6270 09/05/21 ?0403 09/06/21 ?0345  ?WBC 5.3 6.0 6.3  ?RBC 3.17* 3.18* 3.21*  ?HGB 9.4* 9.5* 9.5*  ?HCT 31.8* 31.6* 31.6*  ?MCV 100.3* 99.4 98.4  ?MCH 29.7 29.9 29.6  ?MCHC 29.6* 30.1 30.1  ?RDW 17.2* 17.0* 16.8*  ?PLT 253 283 284  ? ?Thyroid No results for input(s): TSH, FREET4 in the last 168 hours.  ?BNP ?Recent Labs  ?Lab 09/02/21 ?1318  ?BNP 495.4*  ?  ?DDimer  ?Recent Labs  ?Lab 09/04/21 ?1603  ?DDIMER 2.89*  ?  ? ?Radiology  ?  ?ECHOCARDIOGRAM LIMITED ? ?Result Date: 09/04/2021 ?   ECHOCARDIOGRAM LIMITED REPORT   Patient Name:   [name] Date of Exam: 09/04/2021 Medical Rec #:  [id number]        Height:       65.5 in Accession #:    [id number]       Weight:       166.4 lb Date of Birth:  08-04-1922         BSA:          1.840 m? Patient Age:    88 years         BP:           105/51 mmHg Patient Gender: F                 HR:           76 bpm. Exam Location:  Inpatient Procedure: Limited Echo, Limited Color Doppler and Cardiac Doppler Indications:    Chest pain  History:        Patient has prior history of Echocardiogram examinations, most                 recent 06/23/2021. Arrythmias:Atrial Fibrillation,                 Signs/Symptoms:Shortness of Breath; Risk Factors:Diabetes,                 Dyslipidemia and Hypertension.  Sonographer:    Arlyss Gandy Referring Phys: [id number] [place]  1. Left ventricular ejection fraction, by estimation, is 60 to 65%. The left ventricle has normal function. The left ventricle has no regional wall motion abnormalities. Left ventricular diastolic function could not be evaluated.  2. Right ventricular systolic function is mildly reduced. The right ventricular size is moderately enlarged. There is moderately elevated pulmonary artery systolic pressure. The estimated right ventricular systolic pressure is 75.1 mmHg.  3. Left atrial size was moderately dilated.  4. Right atrial size was severely dilated.  5. Large pleural effusion in the left lateral region.  6. Tricuspid valve regurgitation is moderate.  7. The inferior vena cava is normal in size with greater than 50% respiratory variability, suggesting right atrial pressure of 3 mmHg. FINDINGS  Left Ventricle: Left ventricular ejection fraction, by estimation, is 60 to 65%. The left ventricle has normal function. The left ventricle has no regional wall motion abnormalities. The left ventricular internal cavity size was normal in size. Left ventricular  diastolic function could not be evaluated. Left ventricular diastolic function could not be evaluated due to atrial fibrillation. Right Ventricle: The right ventricular size is moderately enlarged. Right ventricular systolic function is mildly reduced. There is moderately elevated pulmonary artery systolic pressure. The tricuspid regurgitant velocity is 3.57 m/s, and  with an assumed right atrial pressure of 8 mmHg, the estimated right ventricular systolic pressure is 17.6 mmHg. Left Atrium: Left atrial size was moderately dilated. Right Atrium: Right atrial size was severely dilated. Tricuspid Valve: The tricuspid valve is normal in structure. Tricuspid valve regurgitation is moderate. Aortic Valve: Aortic valve mean gradient measures 6.0 mmHg. Aortic valve peak gradient measures 10.6 mmHg. Aortic valve area, by VTI measures 1.49 cm?. Venous: The inferior vena cava is normal in size with greater than 50% respiratory variability, suggesting right atrial pressure of 3 mmHg. Additional Comments: There is a large pleural effusion in the left lateral region. LEFT VENTRICLE PLAX 2D LVIDd:         3.90 cm   Diastology LVIDs:         2.80 cm   LV e' medial:    6.75 cm/s LV PW:         0.90 cm   LV E/e' medial:  20.1 LV IVS:        0.90 cm   LV e' lateral:   10.90 cm/s LVOT diam:     2.00 cm   LV E/e' lateral: 12.5 LV SV:         49 LV SV Index:   26 LVOT Area:     3.14 cm?  RIGHT VENTRICLE            IVC RV Basal diam:  4.80 cm    IVC diam: 1.30 cm RV Mid diam:    4.20 cm RV S prime:     7.51 cm/s TAPSE (M-mode): 1.6 cm LEFT ATRIUM             Index        RIGHT ATRIUM           Index LA Vol (A2C):   74.9 ml 40.71 ml/m?  RA Area:     30.80 cm? LA Vol (A4C):   83.5 ml 45.38 ml/m?  RA Volume:   124.00 ml 67.39 ml/m? LA Biplane Vol: 83.6 ml 45.43 ml/m?  AORTIC VALVE AV Area (Vmax):    1.26 cm? AV Area (Vmean):   1.18 cm? AV Area (VTI):     1.49 cm? AV Vmax:           163.00 cm/s AV Vmean:          116.000 cm/s AV VTI:            0.326 m AV Peak Grad:      10.6 mmHg AV Mean Grad:      6.0 mmHg LVOT Vmax:         65.50 cm/s LVOT Vmean:        43.700 cm/s LVOT VTI:          0.155 m LVOT/AV VTI ratio: 0.48 MITRAL VALVE                TRICUSPID VALVE MV Area (PHT): 4.49 cm?     TR Peak grad:   51.0 mmHg MV Decel Time: 169 msec     TR Vmax:        357.00 cm/s MV E velocity: 136.00 cm/s  SHUNTS                             Systemic VTI:  0.16 m                             Systemic Diam: 2.00 cm Mertie Moores MD Electronically signed by Mertie Moores MD Signature Date/Time:

## 2021-09-06 NOTE — Progress Notes (Addendum)
?   09/06/21 0622  ?Output (mL)  ?Urine 300 mL  ?Unmeasured Output  ?Urine Occurrence 1 ?(wet pad)  ?Urine Characteristics  ?Urinary Incontinence Yes  ?Urine Color Yellow/straw  ?Urine Appearance Clear  ?Urinary Interventions Bladder scan  ?Bladder Scan Volume (mL) 101 mL ?(after post void residual from [facility])  ?Hygiene Peri care  ?Stool Characteristics  ?Bowel Incontinence No  ?Stool Type Type 3 (Sausage shape with surface cracks);Type 4 (Like a smooth, soft sausage or snake)  ?Has the patient had three Type 7 stools in the last 24 hours? No  ?Stool Descriptors Black;Green  ?Stool Amount Large  ?Stool Source Rectum  ? ?The first time patient was bladder scanned patient had 457 mL in bladder. Got patient up to the bedside commode. Patient had 300 mL urine output with 101 mL in bladder when bladder scanned after urination on bedside commode. ?

## 2021-09-06 NOTE — Progress Notes (Signed)
?PROGRESS NOTE ? ? ? ?Tara Bradley  OFB:[id number] DOB: [birth year] DOA: 09/02/2021 ?PCP: [name], MD  ? ?Brief Narrative:  ? 88 y.o. female with medical history significant of past medical history of chronic atrial fibrillation on Xarelto, diastolic CHF ef 78% E4MP, DM 2, HLD, HTN, osteoporosis, thoracic aortic aneurysm, DM 2 comes to the hospital from cardiology office for evaluation of shortness of breath.  Patient was diagnosed CHF exacerbation and started on diuretics.  Cardiology team was consulted. ? ? ?Assessment & Plan: ? Principal Problem: ?  Acute exacerbation of CHF (congestive heart failure) ([place]) ?Active Problems: ?  Essential hypertension ?  Osteoarthritis ?  Aortic valve sclerosis ?  Hyperlipidemia ?  Diabetes mellitus, type 2 ([place]) ?  Chronic atrial fibrillation ([facility]) ?  ? ?Acute diastolic congestive heart failure, EF 55%, class IV. g2DD ?Acute respiratory distress with tachypnea saturating 90% on 2 L nasal cannula ?- Slow to improve still bilateral diminished breath sounds diffusely requiring 3-5 L nasal cannula.  Has abnormal bilateral breath sounds as well.  Continue Lasix IV twice daily.  Cardiology following ?  ?Chronic atrial fibrillation ?- On home Cardizem, Toprol-XL twice daily and digoxin.  Continue Xarelto. ?  ?Diabetes mellitus type 2 ?- Holding home metformin.  Sliding scale and Accu-Cheks. ?- Better controlled.  Continue Semglee 10 units daily. ?  ?Peripheral neuropathy secondary to DM2 ?- Continue gabapentin ?  ?Anemia of chronic disease ?- Stable hemoglobin ?  ?Ascending aortic aneurysm ?- Stable previously seen by CT surgery.  No further follow-up needed ?  ?Essential hypertension ?- On Cardizem and Toprol-XL.  IV Lopressor and hydralazine as needed added. ?  ?Hyperlipidemia ?- Not on statin ?  ?Recent hip and shoulder fracture ?- Status post intramedullary nailing on 08/02/2021.  PT/OT, mobilize as much as possible. ? ? ?DVT prophylaxis: On Xarelto ?Code Status: DNR ?Family  Communication:   ? ?Status is: Inpatient ?Remains inpatient appropriate because: Patient still gets very dyspneic with minimal mobility.  Bilateral diminished breath sounds.  Continue IV diuretics ? ? ? ?Subjective: ?Still gets very dyspneic with minimal mobility.  At rest she is feeling little better. ?Examination: ? ?Constitutional: Not in acute distress, 4 L nasal cannula ?Respiratory: Bilateral diminished breath sounds ?Cardiovascular: Normal sinus rhythm, no rubs ?Abdomen: Nontender nondistended good bowel sounds ?Musculoskeletal: 3+ bilateral lower extremity pitting edema ?Skin: No rashes seen ?Neurologic: CN 2-12 grossly intact.  And nonfocal ?Psychiatric: Normal judgment and insight. Alert and oriented x 3. Normal mood. ? ?Objective: ?Vitals:  ? 09/05/21 2048 09/06/21 0420 09/06/21 0500 09/06/21 0820  ?BP: 124/90 119/83    ?Pulse: 98 66    ?Resp: (!) 22 (!) 22    ?Temp: 99 ?F (37.2 ?C) 97.8 ?F (36.6 ?C)    ?TempSrc: Oral Oral    ?SpO2: 100% 100%  99%  ?Weight:   75.4 kg   ?Height:      ? ? ?Intake/Output Summary (Last 24 hours) at 09/06/2021 1055 ?Last data filed at 09/06/2021 5361 ?Gross per 24 hour  ?Intake 920 ml  ?Output 2025 ml  ?Net -1105 ml  ? ?Filed Weights  ? 09/04/21 0500 09/05/21 0458 09/06/21 0500  ?Weight: 75.5 kg 75.6 kg 75.4 kg  ? ? ? ?Data Reviewed:  ? ?CBC: ?Recent Labs  ?Lab 09/02/21 ?1318 09/03/21 ?0356 09/04/21 ?4431 09/05/21 ?0403 09/06/21 ?0345  ?WBC 6.5 5.2 5.3 6.0 6.3  ?NEUTROABS 4.8  --   --   --   --   ?HGB 10.0* 9.3*  9.4* 9.5* 9.5*  ?HCT 33.5* 30.6* 31.8* 31.6* 31.6*  ?MCV 99.4 98.4 100.3* 99.4 98.4  ?PLT 280 259 253 283 284  ? ?Basic Metabolic Panel: ?Recent Labs  ?Lab 09/02/21 ?1318 09/03/21 ?0356 09/04/21 ?6503 09/05/21 ?0403 09/06/21 ?0345 09/06/21 ?5465  ?NA 137 142 138 138  --  135  ?K 4.2 4.2 4.5 3.9  --  3.7  ?CL 93* 94* 95* 90*  --  90*  ?CO2 37* 38* 37* 42*  --  42*  ?GLUCOSE 144* 147* 129* 123*  --  159*  ?BUN 18 17 26* 25*  --  22  ?CREATININE 0.44 0.69 0.80 0.81  --   0.61  ?CALCIUM 9.4 9.2 8.8* 8.6*  --  8.3*  ?MG  --  2.0 2.0 1.9 1.9  --   ? ?GFR: ?Estimated Creatinine Clearance: 41.3 mL/min (by C-G formula based on SCr of 0.61 mg/dL). ?Liver Function Tests: ?Recent Labs  ?Lab 09/02/21 ?1318 09/03/21 ?0356 09/04/21 ?0355 09/05/21 ?0403  ?AST 18 13* 12* 12*  ?ALT '7 7 7 6  '$ ?ALKPHOS 169* 145* 141* 144*  ?BILITOT 0.5 0.5 0.7 0.7  ?PROT 6.7 5.8* 5.4* 5.9*  ?ALBUMIN 3.6 3.1* 2.9* 3.2*  ? ?No results for input(s): LIPASE, AMYLASE in the last 168 hours. ?No results for input(s): AMMONIA in the last 168 hours. ?Coagulation Profile: ?No results for input(s): INR, PROTIME in the last 168 hours. ?Cardiac Enzymes: ?No results for input(s): CKTOTAL, CKMB, CKMBINDEX, TROPONINI in the last 168 hours. ?BNP (last 3 results) ?No results for input(s): PROBNP in the last 8760 hours. ?HbA1C: ?No results for input(s): HGBA1C in the last 72 hours. ?CBG: ?Recent Labs  ?Lab 09/05/21 ?0752 09/05/21 ?1128 09/05/21 ?1623 09/05/21 ?2052 09/06/21 ?0736  ?GLUCAP 102* 121* 196* 212* 104*  ? ?Lipid Profile: ?No results for input(s): CHOL, HDL, LDLCALC, TRIG, CHOLHDL, LDLDIRECT in the last 72 hours. ?Thyroid Function Tests: ?No results for input(s): TSH, T4TOTAL, FREET4, T3FREE, THYROIDAB in the last 72 hours. ?Anemia Panel: ?No results for input(s): VITAMINB12, FOLATE, FERRITIN, TIBC, IRON, RETICCTPCT in the last 72 hours. ?Sepsis Labs: ?No results for input(s): PROCALCITON, LATICACIDVEN in the last 168 hours. ? ? ?Recent Results (from the past 240 hour(s))  ?Resp Panel by RT-PCR (Flu A&B, Covid) Nasopharyngeal Swab     Status: None  ? Collection Time: 08/27/21 10:42 PM  ? Specimen: Nasopharyngeal Swab; Nasopharyngeal(NP) swabs in vial transport medium  ?Result Value Ref Range Status  ? SARS Coronavirus 2 by RT PCR NEGATIVE NEGATIVE Final  ?  Comment: (NOTE) ?SARS-CoV-2 target nucleic acids are NOT DETECTED. ? ?The SARS-CoV-2 RNA is generally detectable in upper respiratory ?specimens during the acute phase of  infection. The lowest ?concentration of SARS-CoV-2 viral copies this assay can detect is ?138 copies/mL. A negative result does not preclude SARS-Cov-2 ?infection and should not be used as the sole basis for treatment or ?other patient management decisions. A negative result may occur with  ?improper specimen collection/handling, submission of specimen other ?than nasopharyngeal swab, presence of viral mutation(s) within the ?areas targeted by this assay, and inadequate number of viral ?copies(<138 copies/mL). A negative result must be combined with ?clinical observations, patient history, and epidemiological ?information. The expected result is Negative. ? ?Fact Sheet for Patients:  ?EntrepreneurPulse.com.au ? ?Fact Sheet for Healthcare Providers:  ?IncredibleEmployment.be ? ?This test is no t yet approved or cleared by the [place] FDA and  ?has been authorized for detection and/or diagnosis of SARS-CoV-2 by ?FDA under an Emergency Use Authorization (  EUA). This EUA will remain  ?in effect (meaning this test can be used) for the duration of the ?COVID-19 declaration under Section 564(b)(1) of the Act, 21 ?U.S.C.section 360bbb-3(b)(1), unless the authorization is terminated  ?or revoked sooner.  ? ? ?  ? Influenza A by PCR NEGATIVE NEGATIVE Final  ? Influenza B by PCR NEGATIVE NEGATIVE Final  ?  Comment: (NOTE) ?The Xpert Xpress SARS-CoV-2/FLU/RSV plus assay is intended as an aid ?in the diagnosis of influenza from Nasopharyngeal swab specimens and ?should not be used as a sole basis for treatment. Nasal washings and ?aspirates are unacceptable for Xpert Xpress SARS-CoV-2/FLU/RSV ?testing. ? ?Fact Sheet for Patients: ?EntrepreneurPulse.com.au ? ?Fact Sheet for Healthcare Providers: ?IncredibleEmployment.be ? ?This test is not yet approved or cleared by the [place] FDA and ?has been authorized for detection and/or diagnosis of SARS-CoV-2  by ?FDA under an Emergency Use Authorization (EUA). This EUA will remain ?in effect (meaning this test can be used) for the duration of the ?COVID-19 declaration under Section 564(b)(1) of the Act, 21 U

## 2021-09-06 NOTE — Progress Notes (Addendum)
Nurse and nurse tech was in patient's room helping patient get on bedpan. Patient started to have trouble breathing. Checked patient's O2 sats. Patient's O2 sats dropped down to the 60s%. Notified Charge Nurse, Rapid Response Nurse, and Respiratory Therapist. Patient was on 5 liters prior to the O2 sats dropping. Bumped patient up to 12 liters of oxygen, which patient's O2 sats came back up to 100%. Monitored patient and patient's O2 sats, and nurse was able to bump patient's oxygen back down to 5 liters of oxygen. Patient is stable and not having any symptoms. Will continue to monitor patient. ?

## 2021-09-06 NOTE — Plan of Care (Signed)
?  Problem: Education: ?Goal: Ability to demonstrate management of disease process will improve ?09/06/2021 1740 by Zadie Rhine, RN ?Outcome: Progressing ?09/06/2021 1735 by Zadie Rhine, RN ?Outcome: Progressing ?Goal: Ability to verbalize understanding of medication therapies will improve ?09/06/2021 1740 by Zadie Rhine, RN ?Outcome: Progressing ?09/06/2021 1735 by Zadie Rhine, RN ?Outcome: Progressing ?Goal: Individualized Educational Video(s) ?09/06/2021 1740 by Zadie Rhine, RN ?Outcome: Progressing ?09/06/2021 1735 by Zadie Rhine, RN ?Outcome: Progressing ?  ?Problem: Activity: ?Goal: Capacity to carry out activities will improve ?09/06/2021 1740 by Zadie Rhine, RN ?Outcome: Progressing ?09/06/2021 1735 by Zadie Rhine, RN ?Outcome: Progressing ?  ?Problem: Cardiac: ?Goal: Ability to achieve and maintain adequate cardiopulmonary perfusion will improve ?09/06/2021 1740 by Zadie Rhine, RN ?Outcome: Progressing ?09/06/2021 1735 by Zadie Rhine, RN ?Outcome: Progressing ?  ?

## 2021-09-06 NOTE — Progress Notes (Signed)
Notified on call provider on two occurences where patient's heart rate went down to 37 and back up to 60s/70s, and also heart rate went down to 39 with PVCs and then returned back to the 60s. ?

## 2021-09-06 NOTE — Progress Notes (Signed)
O2 decreased from 5 liters 100% to 2 liters, will continue to monitor. SRP,RN ?

## 2021-09-07 DIAGNOSIS — I358 Other nonrheumatic aortic valve disorders: Secondary | ICD-10-CM | POA: Diagnosis not present

## 2021-09-07 DIAGNOSIS — I10 Essential (primary) hypertension: Secondary | ICD-10-CM | POA: Diagnosis not present

## 2021-09-07 DIAGNOSIS — I5033 Acute on chronic diastolic (congestive) heart failure: Secondary | ICD-10-CM | POA: Diagnosis not present

## 2021-09-07 DIAGNOSIS — E873 Alkalosis: Secondary | ICD-10-CM | POA: Diagnosis not present

## 2021-09-07 DIAGNOSIS — E119 Type 2 diabetes mellitus without complications: Secondary | ICD-10-CM | POA: Diagnosis not present

## 2021-09-07 DIAGNOSIS — I482 Chronic atrial fibrillation, unspecified: Secondary | ICD-10-CM | POA: Diagnosis not present

## 2021-09-07 LAB — BASIC METABOLIC PANEL
Anion gap: 6 (ref 5–15)
BUN: 21 mg/dL (ref 8–23)
CO2: 45 mmol/L — ABNORMAL HIGH (ref 22–32)
Calcium: 8.7 mg/dL — ABNORMAL LOW (ref 8.9–10.3)
Chloride: 89 mmol/L — ABNORMAL LOW (ref 98–111)
Creatinine, Ser: 0.68 mg/dL (ref 0.44–1.00)
GFR, Estimated: >60 mL/min (ref >60)
Glucose, Bld: 180 mg/dL — ABNORMAL HIGH (ref 70–99)
Potassium: 3.4 mmol/L — ABNORMAL LOW (ref 3.5–5.1)
Sodium: 140 mmol/L (ref 135–145)

## 2021-09-07 LAB — GLUCOSE, CAPILLARY
Glucose-Capillary: 136 mg/dL — ABNORMAL HIGH (ref 70–99)
Glucose-Capillary: 257 mg/dL — ABNORMAL HIGH (ref 70–99)
Glucose-Capillary: 215 mg/dL — ABNORMAL HIGH (ref 70–99)
Glucose-Capillary: 176 mg/dL — ABNORMAL HIGH (ref 70–99)

## 2021-09-07 LAB — MAGNESIUM: Magnesium: 1.8 mg/dL (ref 1.7–2.4)

## 2021-09-07 MED ORDER — FUROSEMIDE 40 MG PO TABS
80.0000 mg | ORAL_TABLET | Freq: Two times a day (BID) | ORAL | Status: DC
  Administered 2021-09-08 – 2021-09-10 (×5): 80 mg via ORAL
  Filled 2021-09-07 (×5): qty 2

## 2021-09-07 MED ORDER — POTASSIUM CHLORIDE CRYS ER 20 MEQ PO TBCR
40.0000 meq | EXTENDED_RELEASE_TABLET | Freq: Once | ORAL | Status: AC
  Administered 2021-09-07: 40 meq via ORAL
  Filled 2021-09-07: qty 2

## 2021-09-07 MED ORDER — POTASSIUM CHLORIDE CRYS ER 20 MEQ PO TBCR: 40.0000 meq | EXTENDED_RELEASE_TABLET | ORAL | Status: DC

## 2021-09-07 MED ORDER — POTASSIUM CHLORIDE CRYS ER 20 MEQ PO TBCR
20.0000 meq | EXTENDED_RELEASE_TABLET | Freq: Every day | ORAL | Status: DC
  Administered 2021-09-08 – 2021-09-11 (×4): 20 meq via ORAL
  Filled 2021-09-07 (×5): qty 1

## 2021-09-07 MED ORDER — ACETAZOLAMIDE 250 MG PO TABS
250.0000 mg | ORAL_TABLET | Freq: Two times a day (BID) | ORAL | Status: DC
  Administered 2021-09-07 – 2021-09-16 (×19): 250 mg via ORAL
  Filled 2021-09-07 (×19): qty 1

## 2021-09-07 MED ORDER — POTASSIUM CHLORIDE CRYS ER 10 MEQ PO TBCR
10.0000 meq | EXTENDED_RELEASE_TABLET | Freq: Once | ORAL | Status: AC
  Administered 2021-09-07: 10 meq via ORAL
  Filled 2021-09-07: qty 1

## 2021-09-07 NOTE — Progress Notes (Signed)
Physical Therapy Treatment ?Patient Details ?Name: Tara Bradley ?MRN: [id number] ?DOB: 24-Nov-1922 ?Today's Date: 09/07/2021 ? ? ?History of Present Illness Tara Bradley is a 88 y.o. female with medical history significant of past medical history of chronic atrial fibrillation on Xarelto, diastolic CHF ef 37% T0GY, DM 2, HLD, HTN, osteoporosis, thoracic aortic aneurysm, DM 2 comes to the hospital from cardiology office for evaluation of shortness of breath.BNP is elevated, chest x-ray suggestive of pulmonary edema/effusion ? ?  ?PT Comments  ? ? The patient is very motivated to progress activity.  States she has not had opportunity to  ambulated.  ?Patient ambulated with min assist 20' and 40'.  ?SPO2 95% on 2 L at rest, 85% on 2 L after ambulation. Return to 94% within 1.5 mins.  With no talk and pursed lip breaths. ? Patient reports and demonstrates having difficulty pulling more than 250 on IS. Encouraged her to keep working on it. ? Patient will benefit from continued  Therapy at SNF.  ?Recommendations for follow up therapy are one component of a multi-disciplinary discharge planning process, led by the attending physician.  Recommendations may be updated based on patient status, additional functional criteria and insurance authorization. ? ?Follow Up Recommendations ? Skilled nursing-short term rehab (<3 hours/day) ?  ?  ?Assistance Recommended at Discharge Intermittent Supervision/Assistance  ?Patient can return home with the following A little help with walking and/or transfers;A little help with bathing/dressing/bathroom;Help with stairs or ramp for entrance;Assist for transportation;Assistance with cooking/housework ?  ?Equipment Recommendations ? None recommended by PT  ?  ?Recommendations for Other Services   ? ? ?  ?Precautions / Restrictions Precautions ?Precautions: Fall ?Precaution Comments: SOB, monitor O2 and HR, ? will need > 2l to amb  ?  ? ?Mobility ? Bed Mobility ?  ?  ?  ?  ?  ?  ?  ?General  bed mobility comments: seated in recliner at start of session and remained in the same ?  ? ?Transfers ?Overall transfer level: Needs assistance ?Equipment used: Rolling walker (2 wheels) ?Transfers: Sit to/from Stand ?Sit to Stand: Min guard ?  ?  ?  ?  ?  ?General transfer comment: patient rises from recliner and BSC ?  ? ?Ambulation/Gait ?Ambulation/Gait assistance: Min assist ?Gait Distance (Feet): 20 then 40 Feet ?Assistive device: Rolling walker (2 wheels) ?Gait Pattern/deviations: Step-to pattern, Step-through pattern, Decreased stride length ?Gait velocity: decr ?  ?  ?General Gait Details: gait slow, steady for turns, maneuvering around objects ? ? ?Stairs ?  ?  ?  ?  ?  ? ? ?Wheelchair Mobility ?  ? ?Modified Rankin (Stroke Patients Only) ?  ? ? ?  ?Balance Overall balance assessment: Needs assistance ?Sitting-balance support: Feet supported ?Sitting balance-Leahy Scale: Good ?  ?  ?Standing balance support: Reliant on assistive device for balance ?Standing balance-Leahy Scale: Fair ?  ?  ?  ?  ?  ?  ?  ?  ?  ?  ?  ?  ?  ? ?  ?Cognition Arousal/Alertness: Awake/alert ?Behavior During Therapy: [facility] for tasks assessed/performed ?Overall Cognitive Status: Within Functional Limits for tasks assessed ?  ?  ?  ?  ?  ?  ?  ?  ?  ?  ?  ?  ?  ?  ?  ?  ?  ?  ?  ? ?  ?Exercises   ? ?  ?General Comments   ?  ?  ? ?Pertinent Vitals/Pain Pain Assessment ?  Pain Assessment: No/denies pain  ? ? ?Home Living   ?  ?  ?  ?  ?  ?  ?  ?  ?  ?   ?  ?Prior Function    ?  ?  ?   ? ?PT Goals (current goals can now be found in the care plan section) Progress towards PT goals: Progressing toward goals ? ?  ?Frequency ? ? ? Min 2X/week ? ? ? ?  ?PT Plan Current plan remains appropriate  ? ? ?Co-evaluation   ?  ?  ?  ?  ? ?  ?AM-PAC PT "6 Clicks" Mobility   ?Outcome Measure ? Help needed turning from your back to your side while in a flat bed without using bedrails?: A Little ?Help needed moving from lying on your back to sitting on the  side of a flat bed without using bedrails?: A Little ?Help needed moving to and from a bed to a chair (including a wheelchair)?: A Little ?Help needed standing up from a chair using your arms (e.g., wheelchair or bedside chair)?: A Little ?Help needed to walk in hospital room?: A Little ?Help needed climbing 3-5 steps with a railing? : A Lot ?6 Click Score: 17 ? ?  ?End of Session Equipment Utilized During Treatment: Oxygen;Gait belt ?Activity Tolerance: Patient tolerated treatment well ?Patient left: in chair;with call bell/phone within reach;with chair alarm set ?Nurse Communication: Mobility status ?PT Visit Diagnosis: Muscle weakness (generalized) (M62.81);Difficulty in walking, not elsewhere classified (R26.2) ?  ? ? ?Time: 6294-7654 ?PT Time Calculation (min) (ACUTE ONLY): 29 min ? ?Charges:  $Gait Training: 8-22 mins ?$Self Care/Home Management: 8-22          ?          ? ?Tresa Endo PT ?Acute Rehabilitation Services ?Pager [phone] ?Office [phone] ? ? ? ?Laronn Devonshire, Shella Maxim ?09/07/2021, 3:36 PM ? ?

## 2021-09-07 NOTE — Progress Notes (Signed)
? ?Progress Note ? ?Patient Name: Tara Bradley ?Date of Encounter: 09/07/2021 ? ?[facility] Cardiologist: [name], MD  ? ?Subjective  ?Denies any chest pain or shortness of breath. ? ?Inpatient Medications  ?  ?Scheduled Meds: ? digoxin  0.0625 mg Oral Daily  ? diltiazem  360 mg Oral Daily  ? feeding supplement  1 Container Oral TID BM  ? ferrous gluconate  324 mg Oral BID WC  ? furosemide  80 mg Intravenous BID  ? gabapentin  100 mg Oral TID  ? insulin aspart  0-9 Units Subcutaneous TID WC  ? insulin glargine-yfgn  10 Units Subcutaneous Daily  ? mouth rinse  15 mL Mouth Rinse BID  ? methocarbamol  500 mg Oral BID  ? metoprolol succinate  100 mg Oral BID  ? potassium chloride  10 mEq Oral Daily  ? Rivaroxaban  15 mg Oral Q supper  ? senna  1 tablet Oral BID  ? tamsulosin  0.4 mg Oral Daily  ? ?Continuous Infusions: ? ?PRN Meds: ?albuterol, dextromethorphan-guaiFENesin, guaiFENesin, hydrALAZINE, ipratropium-albuterol, metoprolol tartrate, oxyCODONE, senna-docusate, traZODone  ? ?Vital Signs  ?  ?Vitals:  ? 09/07/21 0434 09/07/21 0437 09/07/21 0944 09/07/21 1049  ?BP: (!) 144/77  118/72   ?Pulse: 73  88 85  ?Resp: (!) 22  (!) 22   ?Temp: 97.8 ?F (36.6 ?C)     ?TempSrc: Oral     ?SpO2: 99%  95%   ?Weight:  73.6 kg    ?Height:      ? ? ?Intake/Output Summary (Last 24 hours) at 09/07/2021 1114 ?Last data filed at 09/07/2021 0853 ?Gross per 24 hour  ?Intake 1020 ml  ?Output 1300 ml  ?Net -280 ml  ? ? ? ?  09/07/2021  ?  4:37 AM 09/06/2021  ?  5:00 AM 09/05/2021  ?  4:58 AM  ?Last 3 Weights  ?Weight (lbs) 162 lb 4.1 oz 166 lb 3.6 oz 166 lb 10.7 oz  ?Weight (kg) 73.6 kg 75.4 kg 75.6 kg  ?   ? ?Telemetry  ?  ?Atrial fibrillation with controlled ventricular response with heart rate in the 60-70 range- Personally Reviewed ? ?ECG  ?  ?No new tracings since 4/12 - Personally Reviewed ? ?Physical Exam  ? ?GEN: Well nourished, well developed in no acute distress ?HEENT: Normal ?NECK: No JVD; No carotid  bruits ?LYMPHATICS: No lymphadenopathy ?CARDIAC: Irregularly irregular, no murmurs, rubs, gallops ?RESPIRATORY:  Clear to auscultation without rales, wheezing or rhonchi  ?ABDOMEN: Soft, non-tender, non-distended ?MUSCULOSKELETAL: 1+ pedal edema; No deformity  ?SKIN: Warm and dry ?NEUROLOGIC:  Alert and oriented x 3 ?PSYCHIATRIC:  Normal affect   ?Labs  ?  ?High Sensitivity Troponin:   ?Recent Labs  ?Lab 08/27/21 ?2334 08/28/21 ?8315 09/02/21 ?1318 09/02/21 ?2118  ?TROPONINIHS '7 8 9 9  '$ ? ?   ?Chemistry ?Recent Labs  ?Lab 09/03/21 ?0356 09/04/21 ?1761 09/05/21 ?0403 09/06/21 ?0345 09/06/21 ?6073 09/07/21 ?0350  ?NA 142 138 138  --  135 140  ?K 4.2 4.5 3.9  --  3.7 3.4*  ?CL 94* 95* 90*  --  90* 89*  ?CO2 38* 37* 42*  --  42* 45*  ?GLUCOSE 147* 129* 123*  --  159* 180*  ?BUN 17 26* 25*  --  22 21  ?CREATININE 0.69 0.80 0.81  --  0.61 0.68  ?CALCIUM 9.2 8.8* 8.6*  --  8.3* 8.7*  ?MG 2.0 2.0 1.9 1.9  --  1.8  ?PROT 5.8* 5.4* 5.9*  --   --   --   ?  ALBUMIN 3.1* 2.9* 3.2*  --   --   --   ?AST 13* 12* 12*  --   --   --   ?ALT '7 7 6  '$ --   --   --   ?ALKPHOS 145* 141* 144*  --   --   --   ?BILITOT 0.5 0.7 0.7  --   --   --   ?GFRNONAA >60 >60 >60  --  >60 >60  ?ANIONGAP '10 6 6  '$ --  3* 6  ? ?  ?Lipids No results for input(s): CHOL, TRIG, HDL, LABVLDL, LDLCALC, CHOLHDL in the last 168 hours.  ?Hematology ?Recent Labs  ?Lab 09/04/21 ?2694 09/05/21 ?0403 09/06/21 ?0345  ?WBC 5.3 6.0 6.3  ?RBC 3.17* 3.18* 3.21*  ?HGB 9.4* 9.5* 9.5*  ?HCT 31.8* 31.6* 31.6*  ?MCV 100.3* 99.4 98.4  ?MCH 29.7 29.9 29.6  ?MCHC 29.6* 30.1 30.1  ?RDW 17.2* 17.0* 16.8*  ?PLT 253 283 284  ? ? ?Thyroid No results for input(s): TSH, FREET4 in the last 168 hours.  ?BNP ?Recent Labs  ?Lab 09/02/21 ?1318  ?BNP 495.4*  ? ?  ?DDimer  ?Recent Labs  ?Lab 09/04/21 ?1603  ?DDIMER 2.89*  ? ?  ? ?Radiology  ?  ?No results found. ? ?Cardiac Studies  ? ?Echocardiogram on 06/23/21  ? 1. Left ventricular ejection fraction, by estimation, is 55 to 60%. The  ?left ventricle  has normal function. The left ventricle has no regional  ?wall motion abnormalities. Left ventricular diastolic parameters are  ?consistent with Grade II diastolic  ?dysfunction (pseudonormalization).  ? 2. Right ventricular systolic function is normal. The right ventricular  ?size is normal. There is moderately elevated pulmonary artery systolic  ?pressure. The estimated right ventricular systolic pressure is 85.4 mmHg.  ? 3. Left atrial size was moderately dilated.  ? 4. Right atrial size was moderately dilated.  ? 5. The mitral valve is normal in structure. Mild mitral valve  ?regurgitation. No evidence of mitral stenosis.  ? 6. Tricuspid valve regurgitation is moderate to severe.  ? 7. The aortic valve is tricuspid. There is mild calcification of the  ?aortic valve. Aortic valve regurgitation is not visualized. Aortic valve  ?sclerosis is present, with no evidence of aortic valve stenosis.  ? 8. The inferior vena cava is normal in size with <50% respiratory  ?variability, suggesting right atrial pressure of 8 mmHg.  ? ?Comparison(s): No significant change from prior study. Prior images  ?reviewed side by side.  ? ?Patient Profile  ?   ?88 y.o. female with a hx of chronic atrial fibrillation on Xarelto, chronic diastolic heart failure, aortic aneurysm with chronic descending dissection, aortic valve sclerosis, hypertension, hyperlipidemia, type 2 DM who was seen 09/03/2021 for the evaluation of CHF at the request of Dr. Reesa Chew. ? ?Assessment & Plan  ?  ?Acute on Chronic Diastolic Heart Failure  ?- Patient presented complaining of new dyspnea on exertion, orthopnea, some PND, pedal edema. Most recent echocardiogram findings above. At  last office visit on 08/18/2021, patient had evidence of volume overload and BNP came back elevated in the 400s; therefore, Lasix was increased for 1 week.  Since then, she presented to the ED on 08/27/2021 for progressive shortness of breath. BNP was higher in the 600s and chest x-ray  showed cardiomegaly with central pulmonary vascular congestion, small bilateral pleural effusions, and bibasilar infiltrates.  Given IV lasix with improvement.  ?- BNP this admission is elevated but improved to 495.4  ?-  CXR this admission showed interval improvement in pulmonary edema, small bibasilar pleural effusions and bibasilar atelectasis  ?- Oxygen requirement improving and she is now down to 2 L O2 from 4 L with O2 sats 95% ?- Lower extremity edema more noticeable on left, patient did have lower extremity dopplers at SNF that were negative for DVT (and patient on Xarelto)  ?- Dry weight suspected to be approx 135 lbs, patient was 169 lbs on presentation and has decreased to 162 pounds today ?- She put out 1.3 L yesterday and is net -1.8 L ?- Serum creatinine 0.68 and BUN 21 today potassium 3.4.  CO2 continues to drift upwards and now is 45 ?- Strict I/Os, daily weights. Patient understand fluid restriction   ?- Continue Toprol-XL 100 mg twice daily and Lasix 80 mg IV twice daily since she does still have some pedal edema ?- She has developed a contraction alkalosis with CO2 45 now and was 26 in mid March  ?- Will transition IV Lasix to p.o. 80 mg twice daily and add start Diamox 250 mg twice daily for metabolic alkalosis ?- Add compression hose  ?- Replete potassium to keep greater than 4 ?  ?Chronic Atrial Fibrillation  ?-Heart rate is adequately controlled on telemetry  ?-Continue Cardizem CD 360 mg daily, Toprol-XL 100 mg twice daily Xarelto 15 mg daily dosed for reduced creatinine clearance and digoxin 0.0625 mg daily  ?-Continue home Xarelto 15 mg daily (dosed for reduced creatinine clearance 40.9 mL/min). Patient has had multiple falls recently. Patient's CHA2DS2-VASc = 7 (CHF, HTN, DM, vascular disease, female, age x2).  ? ?NSVT  ?- infrequent  ?- Replete low potassium ?- Maintain K>4, mag >2  ?-Continue Toprol-XL 100 mg twice daily ?  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 No further i

## 2021-09-07 NOTE — Plan of Care (Signed)
Tara Bradley has been sitting up in the chair since breakfast.  She continues to have DOE with even minimal activity. She is maintaining her O2 sat >90% on 2lnc. Natoria did ambulate in the room and in the hall with PT today. ?Problem: Education: ?Goal: Ability to demonstrate management of disease process will improve ?Outcome: Progressing ?Goal: Ability to verbalize understanding of medication therapies will improve ?Outcome: Progressing ?Goal: Individualized Educational Video(s) ?Outcome: Progressing ?  ?Problem: Activity: ?Goal: Capacity to carry out activities will improve ?Outcome: Progressing ?  ?Problem: Cardiac: ?Goal: Ability to achieve and maintain adequate cardiopulmonary perfusion will improve ?Outcome: Progressing ?  ?Problem: Education: ?Goal: Knowledge of General Education information will improve ?Description: Including pain rating scale, medication(s)/side effects and non-pharmacologic comfort measures ?Outcome: Progressing ?  ?Problem: Health Behavior/Discharge Planning: ?Goal: Ability to manage health-related needs will improve ?Outcome: Progressing ?  ?Problem: Clinical Measurements: ?Goal: Ability to maintain clinical measurements within normal limits will improve ?Outcome: Progressing ?Goal: Diagnostic test results will improve ?Outcome: Progressing ?Goal: Respiratory complications will improve ?Outcome: Progressing ?Goal: Cardiovascular complication will be avoided ?Outcome: Progressing ?  ?Problem: Activity: ?Goal: Risk for activity intolerance will decrease ?Outcome: Progressing ?  ?Problem: Nutrition: ?Goal: Adequate nutrition will be maintained ?Outcome: Progressing ?  ?Problem: Pain Managment: ?Goal: General experience of comfort will improve ?Outcome: Progressing ?  ?Problem: Safety: ?Goal: Ability to remain free from injury will improve ?Outcome: Progressing ?  ?Problem: Skin Integrity: ?Goal: Risk for impaired skin integrity will decrease ?Outcome: Progressing ?  ?

## 2021-09-07 NOTE — Progress Notes (Signed)
?PROGRESS NOTE ? ? ? ?Tara Bradley  PJA:[id number] DOB: [birth year] DOA: 09/02/2021 ?PCP: [name], MD  ? ?Brief Narrative:  ? 88 y.o. female with medical history significant of past medical history of chronic atrial fibrillation on Xarelto, diastolic CHF ef 34% L9FX, DM 2, HLD, HTN, osteoporosis, thoracic aortic aneurysm, DM 2 comes to the hospital from cardiology office for evaluation of shortness of breath.  Patient was diagnosed CHF exacerbation and started on diuretics.  Cardiology team was consulted. ? ? ?Assessment & Plan: ? Principal Problem: ?  Acute exacerbation of CHF (congestive heart failure) ([place]) ?Active Problems: ?  Essential hypertension ?  Osteoarthritis ?  Aortic valve sclerosis ?  Hyperlipidemia ?  Diabetes mellitus, type 2 ([place]) ?  Chronic atrial fibrillation ([facility]) ?  ? ?Acute diastolic congestive heart failure, EF 55%, class IV. g2DD ?Acute respiratory distress with tachypnea saturating 90% on 2 L nasal cannula ?- Slow to improve.  Still has abnormal breath sounds, requiring 3-4 L nasal cannula.  We will continue Lasix IV twice daily.  Replete electrolytes as necessary. ?Bicarb is slowly trending up, may need Diamox. ?  ?Chronic atrial fibrillation ?- On home Cardizem, Toprol-XL twice daily and digoxin.  Continue Xarelto. ?  ?Diabetes mellitus type 2 ?- Holding home metformin.  Sliding scale and Accu-Cheks. ?- Better controlled.  Continue Semglee 10 units daily ?  ?Peripheral neuropathy secondary to DM2 ?- Continue gabapentin ?  ?Anemia of chronic disease ?- Stable hemoglobin ?  ?Ascending aortic aneurysm ?- Stable previously seen by CT surgery.  No further follow-up needed ?  ?Essential hypertension ?- On Cardizem and Toprol-XL.  IV Lopressor and hydralazine as needed added. ?  ?Hyperlipidemia ?- Not on statin ?  ?Recent hip and shoulder fracture ?- Status post intramedullary nailing on 08/02/2021.  PT/OT, mobilize as much as possible. ? ? ?DVT prophylaxis: On Xarelto ?Code Status:  DNR ?Family Communication:   ? ?Status is: Inpatient ?Remains inpatient appropriate because: Patient still gets very dyspneic with minimal mobility.  Bilateral diminished breath sounds.  Continue IV diuretics ? ? ? ?Subjective: ?Sitting up in the recliner, has some exertional dyspnea.  Tells me she feels more or less same as yesterday not much improved. ? ?Examination: ? ?Constitutional: Not in acute distress at rest but with mobility she gets short of breath.  On 4 L nasal cannula. ?Respiratory: Bilateral diminished breath sounds midway upper lung fields ?Cardiovascular: Normal sinus rhythm, no rubs ?Abdomen: Nontender nondistended good bowel sounds ?Musculoskeletal: 3+ bilateral lower extremity pitting edema ?Skin: No rashes seen ?Neurologic: CN 2-12 grossly intact.  And nonfocal ?Psychiatric: Normal judgment and insight. Alert and oriented x 3. Normal mood. ? ?Objective: ?Vitals:  ? 09/07/21 0434 09/07/21 0437 09/07/21 0944 09/07/21 1049  ?BP: (!) 144/77  118/72   ?Pulse: 73  88 85  ?Resp: (!) 22  (!) 22   ?Temp: 97.8 ?F (36.6 ?C)     ?TempSrc: Oral     ?SpO2: 99%  95%   ?Weight:  73.6 kg    ?Height:      ? ? ?Intake/Output Summary (Last 24 hours) at 09/07/2021 1159 ?Last data filed at 09/07/2021 0853 ?Gross per 24 hour  ?Intake 1020 ml  ?Output 1300 ml  ?Net -280 ml  ? ?Filed Weights  ? 09/05/21 0458 09/06/21 0500 09/07/21 0437  ?Weight: 75.6 kg 75.4 kg 73.6 kg  ? ? ? ?Data Reviewed:  ? ?CBC: ?Recent Labs  ?Lab 09/02/21 ?1318 09/03/21 ?0356 09/04/21 ?9024 09/05/21 ?0403 09/06/21 ?  0345  ?WBC 6.5 5.2 5.3 6.0 6.3  ?NEUTROABS 4.8  --   --   --   --   ?HGB 10.0* 9.3* 9.4* 9.5* 9.5*  ?HCT 33.5* 30.6* 31.8* 31.6* 31.6*  ?MCV 99.4 98.4 100.3* 99.4 98.4  ?PLT 280 259 253 283 284  ? ?Basic Metabolic Panel: ?Recent Labs  ?Lab 09/03/21 ?0356 09/04/21 ?9604 09/05/21 ?0403 09/06/21 ?0345 09/06/21 ?5409 09/07/21 ?0350  ?NA 142 138 138  --  135 140  ?K 4.2 4.5 3.9  --  3.7 3.4*  ?CL 94* 95* 90*  --  90* 89*  ?CO2 38* 37* 42*  --   42* 45*  ?GLUCOSE 147* 129* 123*  --  159* 180*  ?BUN 17 26* 25*  --  22 21  ?CREATININE 0.69 0.80 0.81  --  0.61 0.68  ?CALCIUM 9.2 8.8* 8.6*  --  8.3* 8.7*  ?MG 2.0 2.0 1.9 1.9  --  1.8  ? ?GFR: ?Estimated Creatinine Clearance: 40.9 mL/min (by C-G formula based on SCr of 0.68 mg/dL). ?Liver Function Tests: ?Recent Labs  ?Lab 09/02/21 ?1318 09/03/21 ?0356 09/04/21 ?0355 09/05/21 ?0403  ?AST 18 13* 12* 12*  ?ALT '7 7 7 6  '$ ?ALKPHOS 169* 145* 141* 144*  ?BILITOT 0.5 0.5 0.7 0.7  ?PROT 6.7 5.8* 5.4* 5.9*  ?ALBUMIN 3.6 3.1* 2.9* 3.2*  ? ?No results for input(s): LIPASE, AMYLASE in the last 168 hours. ?No results for input(s): AMMONIA in the last 168 hours. ?Coagulation Profile: ?No results for input(s): INR, PROTIME in the last 168 hours. ?Cardiac Enzymes: ?No results for input(s): CKTOTAL, CKMB, CKMBINDEX, TROPONINI in the last 168 hours. ?BNP (last 3 results) ?No results for input(s): PROBNP in the last 8760 hours. ?HbA1C: ?No results for input(s): HGBA1C in the last 72 hours. ?CBG: ?Recent Labs  ?Lab 09/06/21 ?1121 09/06/21 ?1630 09/06/21 ?2048 09/07/21 ?0755 09/07/21 ?1146  ?GLUCAP 236* 128* 243* 136* 257*  ? ?Lipid Profile: ?No results for input(s): CHOL, HDL, LDLCALC, TRIG, CHOLHDL, LDLDIRECT in the last 72 hours. ?Thyroid Function Tests: ?No results for input(s): TSH, T4TOTAL, FREET4, T3FREE, THYROIDAB in the last 72 hours. ?Anemia Panel: ?No results for input(s): VITAMINB12, FOLATE, FERRITIN, TIBC, IRON, RETICCTPCT in the last 72 hours. ?Sepsis Labs: ?No results for input(s): PROCALCITON, LATICACIDVEN in the last 168 hours. ? ? ?No results found for this or any previous visit (from the past 240 hour(s)). ?  ? ? ? ? ? ?Radiology Studies: ?No results found. ? ? ? ? ? ?Scheduled Meds: ? acetaZOLAMIDE  250 mg Oral BID  ? digoxin  0.0625 mg Oral Daily  ? diltiazem  360 mg Oral Daily  ? feeding supplement  1 Container Oral TID BM  ? ferrous gluconate  324 mg Oral BID WC  ? [START ON 09/08/2021] furosemide  80 mg Oral  BID  ? gabapentin  100 mg Oral TID  ? insulin aspart  0-9 Units Subcutaneous TID WC  ? insulin glargine-yfgn  10 Units Subcutaneous Daily  ? mouth rinse  15 mL Mouth Rinse BID  ? methocarbamol  500 mg Oral BID  ? metoprolol succinate  100 mg Oral BID  ? potassium chloride  10 mEq Oral Once  ? [START ON 09/08/2021] potassium chloride  20 mEq Oral Daily  ? Rivaroxaban  15 mg Oral Q supper  ? senna  1 tablet Oral BID  ? tamsulosin  0.4 mg Oral Daily  ? ?Continuous Infusions: ? ? LOS: 4 days  ? ?Time spent= 35 mins ? ? ? ?  Cooper Moroney Arsenio Loader, MD ?[facility] ? ?If 7PM-7AM, please contact night-coverage ? ?09/07/2021, 11:59 AM  ? ?

## 2021-09-07 NOTE — Consult Note (Signed)
[facility] CM Inpatient Consult ? ? ?09/07/2021 ? ?Tara Bradley ?02-18-1923 ?[id number] ? ?[name] CM) ?  ?Patient was reviewed for less than 30 days unplanned readmission and high unplanned readmission risk score. Per review, patient admitted from SNF.  ? ?Plan: Will continue to follow for progression and disposition plans. ? ?Of note, [facility] Care Management services does not replace or interfere with any services that are arranged by inpatient case management or social work.  ? ?Netta Cedars, MSN, RN ?[facility] Liaison ?Phone [phone] ?Toll free office [phone] ?

## 2021-09-07 NOTE — Plan of Care (Signed)
°  Problem: Education: °Goal: Ability to demonstrate management of disease process will improve °Outcome: Progressing °Goal: Ability to verbalize understanding of medication therapies will improve °Outcome: Progressing °Goal: Individualized Educational Video(s) °Outcome: Progressing °  °

## 2021-09-08 DIAGNOSIS — I5033 Acute on chronic diastolic (congestive) heart failure: Secondary | ICD-10-CM | POA: Diagnosis not present

## 2021-09-08 DIAGNOSIS — I358 Other nonrheumatic aortic valve disorders: Secondary | ICD-10-CM | POA: Diagnosis not present

## 2021-09-08 DIAGNOSIS — I482 Chronic atrial fibrillation, unspecified: Secondary | ICD-10-CM | POA: Diagnosis not present

## 2021-09-08 DIAGNOSIS — E119 Type 2 diabetes mellitus without complications: Secondary | ICD-10-CM | POA: Diagnosis not present

## 2021-09-08 LAB — BASIC METABOLIC PANEL
Anion gap: 5 (ref 5–15)
BUN: 22 mg/dL (ref 8–23)
CO2: 40 mmol/L — ABNORMAL HIGH (ref 22–32)
Calcium: 8.6 mg/dL — ABNORMAL LOW (ref 8.9–10.3)
Chloride: 93 mmol/L — ABNORMAL LOW (ref 98–111)
Creatinine, Ser: 0.7 mg/dL (ref 0.44–1.00)
GFR, Estimated: >60 mL/min (ref >60)
Glucose, Bld: 118 mg/dL — ABNORMAL HIGH (ref 70–99)
Potassium: 3.1 mmol/L — ABNORMAL LOW (ref 3.5–5.1)
Sodium: 138 mmol/L (ref 135–145)

## 2021-09-08 LAB — BRAIN NATRIURETIC PEPTIDE: B Natriuretic Peptide: 432.8 pg/mL — ABNORMAL HIGH (ref 0.0–100.0)

## 2021-09-08 LAB — GLUCOSE, CAPILLARY
Glucose-Capillary: 129 mg/dL — ABNORMAL HIGH (ref 70–99)
Glucose-Capillary: 185 mg/dL — ABNORMAL HIGH (ref 70–99)
Glucose-Capillary: 249 mg/dL — ABNORMAL HIGH (ref 70–99)
Glucose-Capillary: 223 mg/dL — ABNORMAL HIGH (ref 70–99)

## 2021-09-08 LAB — DIGOXIN LEVEL: Digoxin Level: 0.8 ng/mL (ref 0.8–2.0)

## 2021-09-08 LAB — MAGNESIUM: Magnesium: 1.9 mg/dL (ref 1.7–2.4)

## 2021-09-08 MED ORDER — MAGNESIUM OXIDE -MG SUPPLEMENT 400 (240 MG) MG PO TABS
800.0000 mg | ORAL_TABLET | Freq: Once | ORAL | Status: AC
Start: 2021-09-08 — End: 2021-09-08
  Administered 2021-09-08: 800 mg via ORAL
  Filled 2021-09-08: qty 2

## 2021-09-08 MED ORDER — ONDANSETRON HCL 4 MG/2ML IJ SOLN
INTRAMUSCULAR | Status: AC
  Filled 2021-09-08: qty 2

## 2021-09-08 MED ORDER — POTASSIUM CHLORIDE CRYS ER 20 MEQ PO TBCR
40.0000 meq | EXTENDED_RELEASE_TABLET | Freq: Once | ORAL | Status: AC
Start: 2021-09-08 — End: 2021-09-08
  Administered 2021-09-08: 40 meq via ORAL
  Filled 2021-09-08: qty 2

## 2021-09-08 MED ORDER — ONDANSETRON HCL 4 MG/2ML IJ SOLN
4.0000 mg | Freq: Three times a day (TID) | INTRAMUSCULAR | Status: DC | PRN
  Administered 2021-09-08: 4 mg via INTRAVENOUS

## 2021-09-08 NOTE — Progress Notes (Signed)
Occupational Therapy Treatment ?Patient Details ?Name: Tara Bradley ?MRN: [id number] ?DOB: Nov 09, 1922 ?Today's Date: 09/08/2021 ? ? ?History of present illness Tara Bradley is a 88 y.o. female with medical history significant of past medical history of chronic atrial fibrillation on Xarelto, diastolic CHF ef 24% M0NU, DM 2, HLD, HTN, osteoporosis, thoracic aortic aneurysm, DM 2 comes to the hospital from cardiology office for evaluation of shortness of breath.BNP is elevated, chest x-ray suggestive of pulmonary edema/effusion ?  ?OT comments ? Pt progressing towards OT goals this session. Able to demonstrate increased activity tolerance in standing for functional ADL/meaningful occupation as she performed oral care, washing hands and face at min guard. DOE improved from previous sessions, and O2 needs decreasing overall. She continues to require at least 4 L O2 to maintain saturations during functional activity. On 2L she dropped to 78% SpO2. Also performed exercise sit<>stand from the recliner x5 (touch bottom down and immediately stand back up). POC remains appropriate at this time and OT will continue to follow acutely with focus on maximize safety and independence in ADL and functional transfers.   ? ?Recommendations for follow up therapy are one component of a multi-disciplinary discharge planning process, led by the attending physician.  Recommendations may be updated based on patient status, additional functional criteria and insurance authorization. ?   ?Follow Up Recommendations ? Skilled nursing-short term rehab (<3 hours/day) (if she can hire [facility] assist, would be ok for [facility])  ?  ?Assistance Recommended at Discharge Frequent or constant Supervision/Assistance  ?Patient can return home with the following ? A little help with walking and/or transfers;A lot of help with bathing/dressing/bathroom;Assistance with cooking/housework;Help with stairs or ramp for entrance;Assist for transportation ?  ?Equipment  Recommendations ? None recommended by OT (Pt has appropriate DME at this time)  ?  ?Recommendations for Other Services   ? ?  ?Precautions / Restrictions Precautions ?Precautions: Fall ?Precaution Comments: SOB, monitor O2 and HR, ? will need > 2l to amb ?Restrictions ?Weight Bearing Restrictions: No  ? ? ?  ? ?Mobility Bed Mobility ?  ?  ?  ?  ?  ?  ?  ?General bed mobility comments: seated in recliner at start of session and remained in the same ?  ? ?Transfers ?Overall transfer level: Needs assistance ?Equipment used: Rolling walker (2 wheels) ?Transfers: Sit to/from Stand ?Sit to Stand: Min guard ?  ?  ?  ?  ?  ?General transfer comment: patient rises from recliner and BSC ?  ?  ?Balance Overall balance assessment: Needs assistance ?Sitting-balance support: Feet supported ?Sitting balance-Leahy Scale: Good ?  ?  ?Standing balance support: Reliant on assistive device for balance ?Standing balance-Leahy Scale: Fair ?  ?  ?  ?  ?  ?  ?  ?  ?  ?  ?  ?  ?   ? ?ADL either performed or assessed with clinical judgement  ? ?ADL Overall ADL's : Needs assistance/impaired ?  ?  ?Grooming: Wash/dry hands;Wash/dry face;Oral care;Min guard;Standing ?Grooming Details (indicate cue type and reason): sink level ?  ?  ?  ?  ?  ?  ?  ?  ?Toilet Transfer: Minimal assistance;Rolling walker (2 wheels) ?Toilet Transfer Details (indicate cue type and reason): Patient is able to power up to standing without physical assistance, min A for balance ?Toileting- Water quality scientist and Hygiene: Min guard;Sit to/from stand ?Toileting - Clothing Manipulation Details (indicate cue type and reason): able to perform front peri care in standing ?  ?  ?  Functional mobility during ADLs: Minimal assistance;Rolling walker (2 wheels) ?General ADL Comments: Pt on 4L O2 throughout activity, with SpO2 decreasing to 79%, seated rest break and Pt was able to return to 93% SpO2 ?  ? ?Extremity/Trunk Assessment Upper Extremity Assessment ?Upper Extremity  Assessment: Generalized weakness ?  ?Lower Extremity Assessment ?Lower Extremity Assessment: Defer to PT evaluation ?  ?  ?  ? ?Vision   ?  ?  ?Perception   ?  ?Praxis   ?  ? ?Cognition Arousal/Alertness: Awake/alert ?Behavior During Therapy: [facility] for tasks assessed/performed ?Overall Cognitive Status: Within Functional Limits for tasks assessed ?  ?  ?  ?  ?  ?  ?  ?  ?  ?  ?  ?  ?  ?  ?  ?  ?  ?  ?  ?   ?Exercises   ? ?  ?Shoulder Instructions   ? ? ?  ?General Comments on 2L O2 via [place], Pt dropped to 74% SpO2, with seated rest break, PLB and 4L O2 returned to 93% SpO2. With activity she required 4L O2 to maintain >90%  ? ? ?Pertinent Vitals/ Pain       Pain Assessment ?Pain Assessment: No/denies pain ?Pain Intervention(s): Monitored during session, Repositioned ? ?Home Living   ?  ?  ?  ?  ?  ?  ?  ?  ?  ?  ?  ?  ?  ?  ?  ?  ?  ?  ? ?  ?Prior Functioning/Environment    ?  ?  ?  ?   ? ?Frequency ? Min 2X/week  ? ? ? ? ?  ?Progress Toward Goals ? ?OT Goals(current goals can now be found in the care plan section) ? Progress towards OT goals: Progressing toward goals ? ?Acute Rehab OT Goals ?Patient Stated Goal: go home ?OT Goal Formulation: With patient ?Time For Goal Achievement: 09/17/21 ?Potential to Achieve Goals: Good  ?Plan Discharge plan needs to be updated   ? ?Co-evaluation ? ? ?   ?  ?  ?  ?  ? ?  ?AM-PAC OT "6 Clicks" Daily Activity     ?Outcome Measure ? ? Help from another person eating meals?: None ?Help from another person taking care of personal grooming?: A Little ?Help from another person toileting, which includes using toliet, bedpan, or urinal?: A Lot ?Help from another person bathing (including washing, rinsing, drying)?: A Lot ?Help from another person to put on and taking off regular upper body clothing?: A Little ?Help from another person to put on and taking off regular lower body clothing?: A Lot ?6 Click Score: 16 ? ?  ?End of Session Equipment Utilized During Treatment: Oxygen (2-4L) ? ?OT  Visit Diagnosis: Unsteadiness on feet (R26.81);Other abnormalities of gait and mobility (R26.89);Muscle weakness (generalized) (M62.81) ?  ?Activity Tolerance Patient tolerated treatment well ?  ?Patient Left in chair;with call bell/phone within reach;with chair alarm set;Other (comment) (no purewick in place - but new one in room) ?  ?Nurse Communication Mobility status;Precautions (no purewick in place - but in the room) ?  ? ?   ? ?Time: 4332-9518 ?OT Time Calculation (min): 34 min ? ?Charges: OT General Charges ?$OT Visit: 1 Visit ?OT Treatments ?$Self Care/Home Management : 23-37 mins ? ?Jesse Sans OTR/L ?Acute Rehabilitation Services ?Pager: [phone] ?Office: [phone] ? ?Tara Bradley ?09/08/2021, 12:57 PM ?

## 2021-09-08 NOTE — Progress Notes (Signed)
Per central telemetry, Tara Bradley had a 2 second pause this morning and several brief bradycardic episodes to the 30's.  Dr Radford Pax rounding on patient and made aware.  She states okay to given AM doses of digoxin, metoprolol, and cardizem.  ?

## 2021-09-08 NOTE — Plan of Care (Signed)
Tara Bradley was very drowsy and "queasy" this morning.  She did receive oxycodone last night around 3am.  She has been easily arousable but very sleepy all day.  Her afternoon dose of gabapentin was held per Dr Reesa Chew due to her drowsiness. Tara Bradley had a 2 second pause on tele this AM with several brief dips into the 30's.  Dr Radford Pax with cardiology aware and okay to give cardiac meds this AM.  Tara Bradley sat up in the chair most of the shift and walked in the room w/ OT today.  ?Problem: Education: ?Goal: Ability to demonstrate management of disease process will improve ?Outcome: Progressing ?Goal: Ability to verbalize understanding of medication therapies will improve ?Outcome: Progressing ?Goal: Individualized Educational Video(s) ?Outcome: Progressing ?  ?Problem: Activity: ?Goal: Capacity to carry out activities will improve ?Outcome: Progressing ?  ?Problem: Cardiac: ?Goal: Ability to achieve and maintain adequate cardiopulmonary perfusion will improve ?Outcome: Progressing ?  ?Problem: Education: ?Goal: Knowledge of General Education information will improve ?Description: Including pain rating scale, medication(s)/side effects and non-pharmacologic comfort measures ?Outcome: Progressing ?  ?Problem: Health Behavior/Discharge Planning: ?Goal: Ability to manage health-related needs will improve ?Outcome: Progressing ?  ?Problem: Clinical Measurements: ?Goal: Ability to maintain clinical measurements within normal limits will improve ?Outcome: Progressing ?Goal: Diagnostic test results will improve ?Outcome: Progressing ?Goal: Respiratory complications will improve ?Outcome: Progressing ?Goal: Cardiovascular complication will be avoided ?Outcome: Progressing ?  ?Problem: Activity: ?Goal: Risk for activity intolerance will decrease ?Outcome: Progressing ?  ?Problem: Nutrition: ?Goal: Adequate nutrition will be maintained ?Outcome: Progressing ?  ?Problem: Pain Managment: ?Goal: General experience of comfort will  improve ?Outcome: Progressing ?  ?Problem: Safety: ?Goal: Ability to remain free from injury will improve ?Outcome: Progressing ?  ?Problem: Skin Integrity: ?Goal: Risk for impaired skin integrity will decrease ?Outcome: Progressing ?  ?

## 2021-09-08 NOTE — Progress Notes (Signed)
?PROGRESS NOTE ? ? ? ?Tara Bradley  NTI:[id number] DOB: 04/07/1923 DOA: 09/02/2021 ?PCP: [name], MD  ? ?Brief Narrative:  ? 88 y.o. female with medical history significant of past medical history of chronic atrial fibrillation on Xarelto, diastolic CHF ef 86% P6PP, DM 2, HLD, HTN, osteoporosis, thoracic aortic aneurysm, DM 2 comes to the hospital from cardiology office for evaluation of shortness of breath.  Patient was diagnosed CHF exacerbation and started on diuretics.  Cardiology team was consulted.  Patient has been getting aggressively diuresed during the hospitalization. ? ? ?Assessment & Plan: ? Principal Problem: ?  Acute exacerbation of CHF (congestive heart failure) ([place]) ?Active Problems: ?  Essential hypertension ?  Osteoarthritis ?  Aortic valve sclerosis ?  Hyperlipidemia ?  Diabetes mellitus, type 2 ([place]) ?  Chronic atrial fibrillation (HCC) ?  ? ?Acute diastolic congestive heart failure, EF 55%, class IV. g2DD ?Acute respiratory distress with tachypnea saturating 90% on 2 L nasal cannula ?Hypokalemia ?- Slow to improve.  Still has abnormal breath sounds, requiring 3-4 L nasal cannula.  Lab work from today is pending but in the meantime continue IV Lasix.  Replete electrolytes as needed.  Received Diamox due to upward trending of bicarb. ?  ?Chronic atrial fibrillation ?- On home Cardizem, Toprol-XL twice daily and digoxin.  Continue Xarelto.  This morning Cardizem and Toprol on hold due to bradycardia until cleared by cardiology. ?  ?Diabetes mellitus type 2 ?- Holding home metformin.  Sliding scale and Accu-Cheks. ?- Reasonably well-controlled, continue Semglee 10 units daily ?  ?Peripheral neuropathy secondary to DM2 ?- Continue gabapentin ?  ?Anemia of chronic disease ?- Stable hemoglobin ?  ?Ascending aortic aneurysm ?- Stable previously seen by CT surgery.  No further follow-up needed ?  ?Essential hypertension ?- On Cardizem and Toprol-XL, this morning on hold due to bradycardia.  IV  Lopressor and hydralazine as needed added. ?  ?Hyperlipidemia ?- Not on statin ?  ?Recent hip and shoulder fracture ?- Status post intramedullary nailing on 08/02/2021.  PT/OT, mobilize as much as possible. ? ? ?DVT prophylaxis: On Xarelto ?Code Status: DNR ?Family Communication: Family has periodically been updated throughout the hospital stay. ? ?Status is: Inpatient ?Remains inpatient appropriate because: Patient still gets very dyspneic with minimal mobility.  Bilateral diminished breath sounds.  Continue IV diuretics.  Cardiology team following. ? ? ? ?Subjective: ?Patient sitting in the chair, still gets very dyspneic with moving from bed to the chair.  Overnight received oxycodone.  Patient and nurse tells me she received it for " sleep" but this was ordered as pain medication.  For now they have agreed that I can discontinue this medication.  She can ask for trazodone for sleep if necessary. ?On 2 L nasal cannula this morning ? ?Examination: ? ?Constitutional: Not in acute distress at rest but with mobility she gets short of breath.  On 2 L nasal cannula ?Respiratory: Diminished breath sounds bilaterally midway up her lung fields ?Cardiovascular: Normal sinus rhythm, no rubs ?Abdomen: Nontender nondistended good bowel sounds ?Musculoskeletal: 3+ bilateral lower extremity pitting edema ?Skin: No rashes seen ?Neurologic: CN 2-12 grossly intact.  And nonfocal ?Psychiatric: Normal judgment and insight. Alert and oriented x 3. Normal mood. ? ?Objective: ?Vitals:  ? 09/07/21 1049 09/07/21 1425 09/07/21 2046 09/08/21 0650  ?BP:  (!) 120/58 (!) 123/59 (!) 127/59  ?Pulse: 85 61 73 64  ?Resp:  (!) '22 20 18  '$ ?Temp:  97.9 ?F (36.6 ?C) 98.3 ?F (36.8 ?C) [name])  97.4 ?F (36.3 ?C)  ?TempSrc:  Oral Oral Oral  ?SpO2:  97% 96% 100%  ?Weight:      ?Height:      ? ? ?Intake/Output Summary (Last 24 hours) at 09/08/2021 0731 ?Last data filed at 09/08/2021 0600 ?Gross per 24 hour  ?Intake 960 ml  ?Output 400 ml  ?Net 560 ml  ? ?Filed  Weights  ? 09/05/21 0458 09/06/21 0500 09/07/21 0437  ?Weight: 75.6 kg 75.4 kg 73.6 kg  ? ? ? ?Data Reviewed:  ? ?CBC: ?Recent Labs  ?Lab 09/02/21 ?1318 09/03/21 ?0356 09/04/21 ?4235 09/05/21 ?0403 09/06/21 ?0345  ?WBC 6.5 5.2 5.3 6.0 6.3  ?NEUTROABS 4.8  --   --   --   --   ?HGB 10.0* 9.3* 9.4* 9.5* 9.5*  ?HCT 33.5* 30.6* 31.8* 31.6* 31.6*  ?MCV 99.4 98.4 100.3* 99.4 98.4  ?PLT 280 259 253 283 284  ? ?Basic Metabolic Panel: ?Recent Labs  ?Lab 09/03/21 ?0356 09/04/21 ?3614 09/05/21 ?0403 09/06/21 ?0345 09/06/21 ?4315 09/07/21 ?0350  ?NA 142 138 138  --  135 140  ?K 4.2 4.5 3.9  --  3.7 3.4*  ?CL 94* 95* 90*  --  90* 89*  ?CO2 38* 37* 42*  --  42* 45*  ?GLUCOSE 147* 129* 123*  --  159* 180*  ?BUN 17 26* 25*  --  22 21  ?CREATININE 0.69 0.80 0.81  --  0.61 0.68  ?CALCIUM 9.2 8.8* 8.6*  --  8.3* 8.7*  ?MG 2.0 2.0 1.9 1.9  --  1.8  ? ?GFR: ?Estimated Creatinine Clearance: 40.9 mL/min (by C-G formula based on SCr of 0.68 mg/dL). ?Liver Function Tests: ?Recent Labs  ?Lab 09/02/21 ?1318 09/03/21 ?0356 09/04/21 ?0355 09/05/21 ?0403  ?AST 18 13* 12* 12*  ?ALT '7 7 7 6  '$ ?ALKPHOS 169* 145* 141* 144*  ?BILITOT 0.5 0.5 0.7 0.7  ?PROT 6.7 5.8* 5.4* 5.9*  ?ALBUMIN 3.6 3.1* 2.9* 3.2*  ? ?No results for input(s): LIPASE, AMYLASE in the last 168 hours. ?No results for input(s): AMMONIA in the last 168 hours. ?Coagulation Profile: ?No results for input(s): INR, PROTIME in the last 168 hours. ?Cardiac Enzymes: ?No results for input(s): CKTOTAL, CKMB, CKMBINDEX, TROPONINI in the last 168 hours. ?BNP (last 3 results) ?No results for input(s): PROBNP in the last 8760 hours. ?HbA1C: ?No results for input(s): HGBA1C in the last 72 hours. ?CBG: ?Recent Labs  ?Lab 09/07/21 ?4008 09/07/21 ?1146 09/07/21 ?1651 09/07/21 ?2053 09/08/21 ?0719  ?GLUCAP 136* 257* 215* 176* 129*  ? ?Lipid Profile: ?No results for input(s): CHOL, HDL, LDLCALC, TRIG, CHOLHDL, LDLDIRECT in the last 72 hours. ?Thyroid Function Tests: ?No results for input(s): TSH,  T4TOTAL, FREET4, T3FREE, THYROIDAB in the last 72 hours. ?Anemia Panel: ?No results for input(s): VITAMINB12, FOLATE, FERRITIN, TIBC, IRON, RETICCTPCT in the last 72 hours. ?Sepsis Labs: ?No results for input(s): PROCALCITON, LATICACIDVEN in the last 168 hours. ? ? ?No results found for this or any previous visit (from the past 240 hour(s)). ?  ? ? ? ? ? ?Radiology Studies: ?No results found. ? ? ? ? ? ?Scheduled Meds: ? acetaZOLAMIDE  250 mg Oral BID  ? digoxin  0.0625 mg Oral Daily  ? diltiazem  360 mg Oral Daily  ? feeding supplement  1 Container Oral TID BM  ? ferrous gluconate  324 mg Oral BID WC  ? furosemide  80 mg Oral BID  ? gabapentin  100 mg Oral TID  ? insulin aspart  0-9 Units  Subcutaneous TID WC  ? insulin glargine-yfgn  10 Units Subcutaneous Daily  ? magnesium oxide  800 mg Oral Once  ? mouth rinse  15 mL Mouth Rinse BID  ? methocarbamol  500 mg Oral BID  ? metoprolol succinate  100 mg Oral BID  ? potassium chloride  20 mEq Oral Daily  ? potassium chloride  40 mEq Oral Once  ? Rivaroxaban  15 mg Oral Q supper  ? senna  1 tablet Oral BID  ? tamsulosin  0.4 mg Oral Daily  ? ?Continuous Infusions: ? ? LOS: 5 days  ? ?Time spent= 35 mins ? ? ? ?Arabella Revelle Arsenio Loader, MD ?[facility] ? ?If 7PM-7AM, please contact night-coverage ? ?09/08/2021, 7:31 AM  ? ?

## 2021-09-08 NOTE — Progress Notes (Addendum)
? ?Progress Note ? ?Patient Name: Tara Bradley ?Date of Encounter: 09/08/2021 ? ?[facility] Cardiologist: [name], MD  ? ?Subjective  ?No CP or SOB ? ?Inpatient Medications  ?  ?Scheduled Meds: ? acetaZOLAMIDE  250 mg Oral BID  ? digoxin  0.0625 mg Oral Daily  ? diltiazem  360 mg Oral Daily  ? feeding supplement  1 Container Oral TID BM  ? ferrous gluconate  324 mg Oral BID WC  ? furosemide  80 mg Oral BID  ? gabapentin  100 mg Oral TID  ? insulin aspart  0-9 Units Subcutaneous TID WC  ? insulin glargine-yfgn  10 Units Subcutaneous Daily  ? magnesium oxide  800 mg Oral Once  ? mouth rinse  15 mL Mouth Rinse BID  ? methocarbamol  500 mg Oral BID  ? metoprolol succinate  100 mg Oral BID  ? potassium chloride  20 mEq Oral Daily  ? potassium chloride  40 mEq Oral Once  ? Rivaroxaban  15 mg Oral Q supper  ? senna  1 tablet Oral BID  ? tamsulosin  0.4 mg Oral Daily  ? ?Continuous Infusions: ? ?PRN Meds: ?albuterol, dextromethorphan-guaiFENesin, guaiFENesin, hydrALAZINE, ipratropium-albuterol, metoprolol tartrate, ondansetron (ZOFRAN) IV, senna-docusate, traZODone  ? ?Vital Signs  ?  ?Vitals:  ? 09/07/21 2046 09/08/21 0650 09/08/21 0756 09/08/21 1005  ?BP: (!) 123/59 (!) 127/59 124/67 110/65  ?Pulse: 73 64 90 74  ?Resp: '20 18  16  '$ ?Temp: 98.3 ?F (36.8 ?C) (!) 97.4 ?F (36.3 ?C)    ?TempSrc: Oral Oral    ?SpO2: 96% 100% 99% 97%  ?Weight:      ?Height:      ? ? ?Intake/Output Summary (Last 24 hours) at 09/08/2021 1028 ?Last data filed at 09/08/2021 0600 ?Gross per 24 hour  ?Intake 720 ml  ?Output 400 ml  ?Net 320 ml  ? ? ? ?  09/07/2021  ?  4:37 AM 09/06/2021  ?  5:00 AM 09/05/2021  ?  4:58 AM  ?Last 3 Weights  ?Weight (lbs) 162 lb 4.1 oz 166 lb 3.6 oz 166 lb 10.7 oz  ?Weight (kg) 73.6 kg 75.4 kg 75.6 kg  ?   ? ?Telemetry  ?  ?Atrial fibrillation with CVR at 82bpm with few 2.5 sec pauses while sleeping- Personally Reviewed ? ?ECG  ?  ?No new tracings since 4/12 - Personally Reviewed ? ?Physical Exam  ? ?GEN: Well  nourished, well developed in no acute distress ?HEENT: Normal ?NECK: No JVD; No carotid bruits ?LYMPHATICS: No lymphadenopathy ?CARDIAC:irregularly irregular, no murmurs, rubs, gallops ?RESPIRATORY:  Clear to auscultation without rales, wheezing or rhonchi  ?ABDOMEN: Soft, non-tender, non-distended ?MUSCULOSKELETAL:  No edema; No deformity  ?SKIN: Warm and dry ?NEUROLOGIC:  Alert and oriented x 3 ?PSYCHIATRIC:  Normal affect   ?  ?High Sensitivity Troponin:   ?Recent Labs  ?Lab 08/27/21 ?2334 08/28/21 ?5427 09/02/21 ?1318 09/02/21 ?2118  ?TROPONINIHS '7 8 9 9  '$ ? ?   ?Chemistry ?Recent Labs  ?Lab 09/03/21 ?0356 09/04/21 ?0623 09/05/21 ?0403 09/06/21 ?0345 09/06/21 ?7628 09/07/21 ?0350 09/08/21 ?3151  ?NA 142 138 138  --  135 140 138  ?K 4.2 4.5 3.9  --  3.7 3.4* 3.1*  ?CL 94* 95* 90*  --  90* 89* 93*  ?CO2 38* 37* 42*  --  42* 45* 40*  ?GLUCOSE 147* 129* 123*  --  159* 180* 118*  ?BUN 17 26* 25*  --  '22 21 22  '$ ?CREATININE 0.69 0.80 0.81  --  0.61 0.68 0.70  ?CALCIUM 9.2 8.8* 8.6*  --  8.3* 8.7* 8.6*  ?MG 2.0 2.0 1.9 1.9  --  1.8 1.9  ?PROT 5.8* 5.4* 5.9*  --   --   --   --   ?ALBUMIN 3.1* 2.9* 3.2*  --   --   --   --   ?AST 13* 12* 12*  --   --   --   --   ?ALT '7 7 6  '$ --   --   --   --   ?ALKPHOS 145* 141* 144*  --   --   --   --   ?BILITOT 0.5 0.7 0.7  --   --   --   --   ?GFRNONAA >60 >60 >60  --  >60 >60 >60  ?ANIONGAP '10 6 6  '$ --  3* 6 5  ? ?  ?Lipids No results for input(s): CHOL, TRIG, HDL, LABVLDL, LDLCALC, CHOLHDL in the last 168 hours.  ?Hematology ?Recent Labs  ?Lab 09/04/21 ?9563 09/05/21 ?0403 09/06/21 ?0345  ?WBC 5.3 6.0 6.3  ?RBC 3.17* 3.18* 3.21*  ?HGB 9.4* 9.5* 9.5*  ?HCT 31.8* 31.6* 31.6*  ?MCV 100.3* 99.4 98.4  ?MCH 29.7 29.9 29.6  ?MCHC 29.6* 30.1 30.1  ?RDW 17.2* 17.0* 16.8*  ?PLT 253 283 284  ? ? ?Thyroid No results for input(s): TSH, FREET4 in the last 168 hours.  ?BNP ?Recent Labs  ?Lab 09/02/21 ?1318  ?BNP 495.4*  ? ?  ?DDimer  ?Recent Labs  ?Lab 09/04/21 ?1603  ?DDIMER 2.89*  ? ?  ? ?Radiology   ?  ?No results found. ? ?Cardiac Studies  ? ?Echocardiogram on 06/23/21  ? 1. Left ventricular ejection fraction, by estimation, is 55 to 60%. The  ?left ventricle has normal function. The left ventricle has no regional  ?wall motion abnormalities. Left ventricular diastolic parameters are  ?consistent with Grade II diastolic  ?dysfunction (pseudonormalization).  ? 2. Right ventricular systolic function is normal. The right ventricular  ?size is normal. There is moderately elevated pulmonary artery systolic  ?pressure. The estimated right ventricular systolic pressure is 87.5 mmHg.  ? 3. Left atrial size was moderately dilated.  ? 4. Right atrial size was moderately dilated.  ? 5. The mitral valve is normal in structure. Mild mitral valve  ?regurgitation. No evidence of mitral stenosis.  ? 6. Tricuspid valve regurgitation is moderate to severe.  ? 7. The aortic valve is tricuspid. There is mild calcification of the  ?aortic valve. Aortic valve regurgitation is not visualized. Aortic valve  ?sclerosis is present, with no evidence of aortic valve stenosis.  ? 8. The inferior vena cava is normal in size with <50% respiratory  ?variability, suggesting right atrial pressure of 8 mmHg.  ? ?Comparison(s): No significant change from prior study. Prior images  ?reviewed side by side.  ? ?Patient Profile  ?   ?88 y.o. female with a hx of chronic atrial fibrillation on Xarelto, chronic diastolic heart failure, aortic aneurysm with chronic descending dissection, aortic valve sclerosis, hypertension, hyperlipidemia, type 2 DM who was seen 09/03/2021 for the evaluation of CHF at the request of Dr. Reesa Chew. ? ?Assessment & Plan  ?  ?Acute on Chronic Diastolic Heart Failure  ?- Patient presented complaining of new dyspnea on exertion, orthopnea, some PND, pedal edema. Most recent echocardiogram findings above. At  last office visit on 08/18/2021, patient had evidence of volume overload and BNP came back elevated in the 400s; therefore,  Lasix was  increased for 1 week.  Since then, she presented to the ED on 08/27/2021 for progressive shortness of breath. BNP was higher in the 600s and chest x-ray showed cardiomegaly with central pulmonary vascular congestion, small bilateral pleural effusions, and bibasilar infiltrates.  Given IV lasix with improvement.  ?- BNP this admission is elevated but improved to 495.4  ?- CXR this admission showed interval improvement in pulmonary edema, small bibasilar pleural effusions and bibasilar atelectasis  ?- Oxygen requirement improving and she is now down to 2 L O2 from 4 L with O2 sats 97% ?- Lower extremity edema more noticeable on left, patient did have lower extremity dopplers at SNF that were negative for DVT (and patient on Xarelto)  ?- Dry weight suspected to be approx 135 lbs, patient was 169 lbs on presentation and has decreased to 162 pounds yesterday and not recorded today ?- She put out  400cc yesterday and is net -1.5 L but does not appear complete ?- Serum creatinine 0.70 and BUN 22 today  ?- started on Diamox yesterday with improvement in CO2 from 45 to 40 this am (26 in mid March) ?- Strict I/Os, daily weights. Patient understand fluid restriction   ?- I think she is near euvolemia>>check BNP today ?- Continue Toprol-XL 100 mg twice daily  ?- Continue p.o. Lasix 80 mg twice daily and Diamox 250 mg twice daily for metabolic alkalosis ?- continue compression hose for pedal edema ?- Replete potassium to keep greater than 4 ?  ?Chronic Atrial Fibrillation  ?-HR controlled on tele and BP stable at 110/30mHg ?-few 2.5 second pauses on tele but asymptomatic and sleeping ?-Continue Cardizem CD 360 mg daily, Toprol-XL 100 mg twice daily and digoxin 0.0625 mg daily  ?-check dig level today ?-Continue home Xarelto 15 mg daily (dosed for reduced creatinine clearance 40.9 mL/min). Patient has had multiple falls recently. Patient's CHA2DS2-VASc = 7 (CHF, HTN, DM, vascular disease, female, age x2).  ? ?NSVT  ?-  infrequent  ?- Replete low potassium (3.1 today) ?- Maintain K>4, mag >2  ?- Continue Toprol-XL 100 mg twice daily ?  ?Ascending aortic aneurysm  ?- CTA in 2018 showed progressive predominantly thrombosed

## 2021-09-09 ENCOUNTER — Inpatient Hospital Stay (HOSPITAL_COMMUNITY): Payer: Medicare Other

## 2021-09-09 DIAGNOSIS — I482 Chronic atrial fibrillation, unspecified: Secondary | ICD-10-CM | POA: Diagnosis not present

## 2021-09-09 DIAGNOSIS — I5033 Acute on chronic diastolic (congestive) heart failure: Secondary | ICD-10-CM | POA: Diagnosis not present

## 2021-09-09 DIAGNOSIS — J90 Pleural effusion, not elsewhere classified: Secondary | ICD-10-CM | POA: Diagnosis present

## 2021-09-09 DIAGNOSIS — I10 Essential (primary) hypertension: Secondary | ICD-10-CM | POA: Diagnosis not present

## 2021-09-09 DIAGNOSIS — E873 Alkalosis: Secondary | ICD-10-CM | POA: Diagnosis not present

## 2021-09-09 DIAGNOSIS — E1140 Type 2 diabetes mellitus with diabetic neuropathy, unspecified: Secondary | ICD-10-CM | POA: Diagnosis present

## 2021-09-09 LAB — CBC
HCT: 29.3 % — ABNORMAL LOW (ref 36.0–46.0)
Hemoglobin: 8.8 g/dL — ABNORMAL LOW (ref 12.0–15.0)
MCH: 30.4 pg (ref 26.0–34.0)
MCHC: 30 g/dL (ref 30.0–36.0)
MCV: 101.4 fL — ABNORMAL HIGH (ref 80.0–100.0)
Platelets: 234 10*3/uL (ref 150–400)
RBC: 2.89 MIL/uL — ABNORMAL LOW (ref 3.87–5.11)
RDW: 16.6 % — ABNORMAL HIGH (ref 11.5–15.5)
WBC: 5.7 10*3/uL (ref 4.0–10.5)
nRBC: 0 % (ref 0.0–0.2)

## 2021-09-09 LAB — GLUCOSE, CAPILLARY
Glucose-Capillary: 144 mg/dL — ABNORMAL HIGH (ref 70–99)
Glucose-Capillary: 280 mg/dL — ABNORMAL HIGH (ref 70–99)
Glucose-Capillary: 159 mg/dL — ABNORMAL HIGH (ref 70–99)
Glucose-Capillary: 170 mg/dL — ABNORMAL HIGH (ref 70–99)

## 2021-09-09 LAB — BASIC METABOLIC PANEL
Anion gap: 7 (ref 5–15)
BUN: 27 mg/dL — ABNORMAL HIGH (ref 8–23)
CO2: 35 mmol/L — ABNORMAL HIGH (ref 22–32)
Calcium: 8.4 mg/dL — ABNORMAL LOW (ref 8.9–10.3)
Chloride: 94 mmol/L — ABNORMAL LOW (ref 98–111)
Creatinine, Ser: 0.77 mg/dL (ref 0.44–1.00)
GFR, Estimated: >60 mL/min (ref >60)
Glucose, Bld: 166 mg/dL — ABNORMAL HIGH (ref 70–99)
Potassium: 3.8 mmol/L (ref 3.5–5.1)
Sodium: 136 mmol/L (ref 135–145)

## 2021-09-09 LAB — MAGNESIUM: Magnesium: 2.1 mg/dL (ref 1.7–2.4)

## 2021-09-09 MED ORDER — METOPROLOL SUCCINATE ER 100 MG PO TB24
100.0000 mg | ORAL_TABLET | Freq: Every day | ORAL | Status: DC
Start: 2021-09-10 — End: 2021-09-16
  Administered 2021-09-10 – 2021-09-16 (×7): 100 mg via ORAL
  Filled 2021-09-09 (×7): qty 1

## 2021-09-09 NOTE — Hospital Course (Signed)
88 y.o. female with medical history significant of past medical history of chronic atrial fibrillation on Xarelto, diastolic CHF ef 27% M7EM, DM 2, HLD, HTN, osteoporosis, thoracic aortic aneurysm, DM 2 comes to the hospital from cardiology office for evaluation of shortness of breath.  Patient was diagnosed CHF exacerbation and started on diuretics.  Cardiology team was consulted.  Patient has been getting aggressively diuresed during the hospitalization. ?

## 2021-09-09 NOTE — Plan of Care (Signed)

## 2021-09-09 NOTE — Plan of Care (Signed)
?  Problem: Education: ?Goal: Ability to demonstrate management of disease process will improve ?Outcome: Progressing ?Goal: Ability to verbalize understanding of medication therapies will improve ?Outcome: Progressing ?Goal: Individualized Educational Video(s) ?Outcome: Progressing ?  ?Problem: Activity: ?Goal: Capacity to carry out activities will improve ?Outcome: Progressing ?  ?Problem: Education: ?Goal: Knowledge of General Education information will improve ?Description: Including pain rating scale, medication(s)/side effects and non-pharmacologic comfort measures ?Outcome: Progressing ?  ?Problem: Nutrition: ?Goal: Adequate nutrition will be maintained ?Outcome: Progressing ?  ?Problem: Pain Managment: ?Goal: General experience of comfort will improve ?Outcome: Progressing ?  ?Problem: Skin Integrity: ?Goal: Risk for impaired skin integrity will decrease ?Outcome: Progressing ?  ?Problem: Safety: ?Goal: Ability to remain free from injury will improve ?Outcome: Progressing ?  ?

## 2021-09-09 NOTE — Progress Notes (Signed)
Patient ambulated in hallway with oxygen at 3L.  Able to ambulate aprox 41f before she became shaky and short of breath.  Oxygen saturations on 3L after were 83-86%.  Increased to 4L with slow recovery.  Will continue to monitor.  ?

## 2021-09-09 NOTE — Care Management Important Message (Signed)
Important Message ? ?Patient Details IM Letter placed in Patients room. ?Name: Tara Bradley ?MRN: [id number] ?Date of Birth: 23-Mar-1923 ? ? ?Medicare Important Message Given:  Yes ? ? ? ? ?[name] ?09/09/2021, 1:41 PM ?

## 2021-09-09 NOTE — Progress Notes (Signed)
?  Progress Note ?Patient: Tara Bradley JQG:[id number] DOB: 04-Dec-1922 DOA: 09/02/2021  ?DOS: the patient was seen and examined on 09/09/2021 ? ?Brief hospital course: ?88 y.o. female with medical history significant of past medical history of chronic atrial fibrillation on Xarelto, diastolic CHF ef 19% X5OI, DM 2, HLD, HTN, osteoporosis, thoracic aortic aneurysm, DM 2 comes to the hospital from cardiology office for evaluation of shortness of breath.  Patient was diagnosed CHF exacerbation and started on diuretics.  Cardiology team was consulted.  Patient has been getting aggressively diuresed during the hospitalization. ? ?Assessment and Plan: ?Principal Problem: ?  Acute on chronic diastolic CHF (congestive heart failure) ([place]) ?  Aortic valve sclerosis ?  Acute respiratory failure with hypoxia ([place]) ?  Bilateral pleural effusion ?  Chronic atrial fibrillation (HCC) ? ?Complaints of cough and shortness of breath. ?Recent echocardiogram shows preserved EF. ?Cardiology consulted. ?Treated with IV Lasix. ?Currently on p.o. Lasix. ?Started on Diamox due to worsening bicarb. ?Currently appears to be volume overloaded.  Will monitor. ?Still on 4 L of oxygen, at baseline does not use any oxygen. ?Will attempt to wean to room air with incentive spirometry and check home oxygen evaluation tomorrow. ? ?  Uncontrolled type 2 diabetes mellitus with hyperglycemia, with long-term current use of insulin ([place]) ?  Diabetic neuropathy ([place]) ?Hemoglobin A1c 8.6. ?Currently on sliding scale insulin. ?Continue basal bolus regimen with insulin regimen ?Holding metformin. ? ?  Essential hypertension ?Blood pressure relatively stable. ?Currently on Cardizem and Toprol-XL. ?In the setting of bradycardia we will be holding Toprol-XL for tonight. ? ?  Osteoarthritis ?Recent hip and shoulder fracture. ?Continue PT OT consultation. ?Mobilize as needed. ? ?  Hyperlipidemia ?Not on statin. ? ?  Thoracic aortic aneurysm ([place]) ?Previously seen by  CT surgery. ?No intervention or follow-up recommended based on her age. ? ?Subjective: Continues to have shortness of breath.  No cough.  No chest pain or chest tightness.  No fever no chills.  Still has swelling in the leg although improving. ? ?Physical Exam: ?Vitals:  ? 09/08/21 2047 09/09/21 0514 09/09/21 0844 09/09/21 0848  ?BP: 117/72 108/61 (!) 110/57 (!) 110/57  ?Pulse: 78 79 65 66  ?Resp: '18 18  17  '$ ?Temp: (!) 97.5 ?F (36.4 ?C) 98.2 ?F (36.8 ?C)  98.7 ?F (37.1 ?C)  ?TempSrc: Oral Oral  Oral  ?SpO2: 97% 98%  100%  ?Weight:      ?Height:      ? ?General: Appear in mild distress; no visible Abnormal Neck Mass Or lumps, Conjunctiva normal ?Cardiovascular: S1 and S2 Present, aortic systolic  Murmur, ?Respiratory: good respiratory effort, Bilateral Air entry present and  bilateral  Crackles, no wheezes ?Abdomen: Bowel Sound present, Non tender  ?Extremities: bilateral  Pedal edema ?Neurology: alert and oriented to time, place, and person ?Gait not checked due to patient safety concerns  ? ?Data Reviewed: ?I have Reviewed nursing notes, Vitals, and Lab results since pt's last encounter. Pertinent lab results CBC and BMP ?I have ordered test including CBC and BMP ?I have reviewed the last note from cardiology,   ? ?Family Communication: None at bedside ? ?Disposition: ?Status is: Inpatient ?Remains inpatient appropriate because: Close observation for volume overload. ? ?Author: ?Berle Mull, MD ?09/09/2021 8:12 PM ? ?Please look on [email or web address] to find out who is on call. ?

## 2021-09-09 NOTE — Progress Notes (Signed)
? ?Progress Note ? ?Patient Name: Tara Bradley ?Date of Encounter: 09/09/2021 ? ?[facility] Cardiologist: [name], MD  ? ?Subjective  ?Denies any chest pain or shortness of breath ? ?Inpatient Medications  ?  ?Scheduled Meds: ? acetaZOLAMIDE  250 mg Oral BID  ? digoxin  0.0625 mg Oral Daily  ? diltiazem  360 mg Oral Daily  ? feeding supplement  1 Container Oral TID BM  ? ferrous gluconate  324 mg Oral BID WC  ? furosemide  80 mg Oral BID  ? gabapentin  100 mg Oral TID  ? insulin aspart  0-9 Units Subcutaneous TID WC  ? insulin glargine-yfgn  10 Units Subcutaneous Daily  ? mouth rinse  15 mL Mouth Rinse BID  ? methocarbamol  500 mg Oral BID  ? metoprolol succinate  100 mg Oral BID  ? potassium chloride  20 mEq Oral Daily  ? Rivaroxaban  15 mg Oral Q supper  ? senna  1 tablet Oral BID  ? tamsulosin  0.4 mg Oral Daily  ? ?Continuous Infusions: ? ?PRN Meds: ?albuterol, dextromethorphan-guaiFENesin, guaiFENesin, hydrALAZINE, ipratropium-albuterol, metoprolol tartrate, ondansetron (ZOFRAN) IV, senna-docusate, traZODone  ? ?Vital Signs  ?  ?Vitals:  ? 09/08/21 2047 09/09/21 0514 09/09/21 0844 09/09/21 0848  ?BP: 117/72 108/61 (!) 110/57 (!) 110/57  ?Pulse: 78 79 65 66  ?Resp: '18 18  17  '$ ?Temp: (!) 97.5 ?F (36.4 ?C) 98.2 ?F (36.8 ?C)  98.7 ?F (37.1 ?C)  ?TempSrc: Oral Oral  Oral  ?SpO2: 97% 98%  100%  ?Weight:      ?Height:      ? ? ?Intake/Output Summary (Last 24 hours) at 09/09/2021 1115 ?Last data filed at 09/08/2021 2200 ?Gross per 24 hour  ?Intake 400 ml  ?Output 550 ml  ?Net -150 ml  ? ? ? ?  09/07/2021  ?  4:37 AM 09/06/2021  ?  5:00 AM 09/05/2021  ?  4:58 AM  ?Last 3 Weights  ?Weight (lbs) 162 lb 4.1 oz 166 lb 3.6 oz 166 lb 10.7 oz  ?Weight (kg) 73.6 kg 75.4 kg 75.6 kg  ?   ? ?Telemetry  ?  ?Atrial fibrillation with controlled ventricular response with heart rate mainly in the 60s but occasionally drops down to the 50s with occasional 2.5-second pauses and then- Personally Reviewed ? ?ECG  ?  ?No new  tracings since 4/12 - Personally Reviewed ? ?Physical Exam  ? ?GEN: Well nourished, well developed in no acute distress ?HEENT: Normal ?NECK: No JVD; No carotid bruits ?LYMPHATICS: No lymphadenopathy ?CARDIAC: Irregularly irregular, no murmurs, rubs, gallops ?RESPIRATORY:  Clear to auscultation without rales, wheezing or rhonchi  ?ABDOMEN: Soft, non-tender, non-distended ?MUSCULOSKELETAL:  No edema; No deformity  ?SKIN: Warm and dry ?NEUROLOGIC:  Alert and oriented x 3 ?PSYCHIATRIC:  Normal affect    ? ?High Sensitivity Troponin:   ?Recent Labs  ?Lab 08/27/21 ?2334 08/28/21 ?7829 09/02/21 ?1318 09/02/21 ?2118  ?TROPONINIHS '7 8 9 9  '$ ? ?   ?Chemistry ?Recent Labs  ?Lab 09/03/21 ?0356 09/04/21 ?5621 09/05/21 ?0403 09/06/21 ?0345 09/07/21 ?0350 09/08/21 ?3086 09/09/21 ?0416  ?NA 142 138 138   < > 140 138 136  ?K 4.2 4.5 3.9   < > 3.4* 3.1* 3.8  ?CL 94* 95* 90*   < > 89* 93* 94*  ?CO2 38* 37* 42*   < > 45* 40* 35*  ?GLUCOSE 147* 129* 123*   < > 180* 118* 166*  ?BUN 17 26* 25*   < >  21 22 27*  ?CREATININE 0.69 0.80 0.81   < > 0.68 0.70 0.77  ?CALCIUM 9.2 8.8* 8.6*   < > 8.7* 8.6* 8.4*  ?MG 2.0 2.0 1.9   < > 1.8 1.9 2.1  ?PROT 5.8* 5.4* 5.9*  --   --   --   --   ?ALBUMIN 3.1* 2.9* 3.2*  --   --   --   --   ?AST 13* 12* 12*  --   --   --   --   ?ALT '7 7 6  '$ --   --   --   --   ?ALKPHOS 145* 141* 144*  --   --   --   --   ?BILITOT 0.5 0.7 0.7  --   --   --   --   ?GFRNONAA >60 >60 >60   < > >60 >60 >60  ?ANIONGAP '10 6 6   '$ < > '6 5 7  '$ ? < > = values in this interval not displayed.  ? ?  ?Lipids No results for input(s): CHOL, TRIG, HDL, LABVLDL, LDLCALC, CHOLHDL in the last 168 hours.  ?Hematology ?Recent Labs  ?Lab 09/05/21 ?0403 09/06/21 ?0345 09/09/21 ?0416  ?WBC 6.0 6.3 5.7  ?RBC 3.18* 3.21* 2.89*  ?HGB 9.5* 9.5* 8.8*  ?HCT 31.6* 31.6* 29.3*  ?MCV 99.4 98.4 101.4*  ?MCH 29.9 29.6 30.4  ?MCHC 30.1 30.1 30.0  ?RDW 17.0* 16.8* 16.6*  ?PLT 283 284 234  ? ? ?Thyroid No results for input(s): TSH, FREET4 in the last 168 hours.   ?BNP ?Recent Labs  ?Lab 09/02/21 ?1318 09/08/21 ?0709  ?BNP 495.4* 432.8*  ? ?  ?DDimer  ?Recent Labs  ?Lab 09/04/21 ?1603  ?DDIMER 2.89*  ? ?  ? ?Radiology  ?  ?No results found. ? ?Cardiac Studies  ? ?Echocardiogram on 06/23/21  ? 1. Left ventricular ejection fraction, by estimation, is 55 to 60%. The  ?left ventricle has normal function. The left ventricle has no regional  ?wall motion abnormalities. Left ventricular diastolic parameters are  ?consistent with Grade II diastolic  ?dysfunction (pseudonormalization).  ? 2. Right ventricular systolic function is normal. The right ventricular  ?size is normal. There is moderately elevated pulmonary artery systolic  ?pressure. The estimated right ventricular systolic pressure is 63.1 mmHg.  ? 3. Left atrial size was moderately dilated.  ? 4. Right atrial size was moderately dilated.  ? 5. The mitral valve is normal in structure. Mild mitral valve  ?regurgitation. No evidence of mitral stenosis.  ? 6. Tricuspid valve regurgitation is moderate to severe.  ? 7. The aortic valve is tricuspid. There is mild calcification of the  ?aortic valve. Aortic valve regurgitation is not visualized. Aortic valve  ?sclerosis is present, with no evidence of aortic valve stenosis.  ? 8. The inferior vena cava is normal in size with <50% respiratory  ?variability, suggesting right atrial pressure of 8 mmHg.  ? ?Comparison(s): No significant change from prior study. Prior images  ?reviewed side by side.  ? ?Patient Profile  ?   ?87 y.o. female with a hx of chronic atrial fibrillation on Xarelto, chronic diastolic heart failure, aortic aneurysm with chronic descending dissection, aortic valve sclerosis, hypertension, hyperlipidemia, type 2 DM who was seen 09/03/2021 for the evaluation of CHF at the request of Dr. Reesa Chew. ? ?Assessment & Plan  ?  ?Acute on Chronic Diastolic Heart Failure  ?- Patient presented complaining of new dyspnea on exertion, orthopnea, some PND, pedal edema.  Most recent  echocardiogram findings above. At  last office visit on 08/18/2021, patient had evidence of volume overload and BNP came back elevated in the 400s; therefore, Lasix was increased for 1 week.  Since then, she presented to the ED on 08/27/2021 for progressive shortness of breath. BNP was higher in the 600s and chest x-ray showed cardiomegaly with central pulmonary vascular congestion, small bilateral pleural effusions, and bibasilar infiltrates.  Given IV lasix with improvement.  ?- BNP this admission is elevated but improved to 495.4  ?- CXR this admission showed interval improvement in pulmonary edema, small bibasilar pleural effusions and bibasilar atelectasis  ?- Oxygen requirement improving and she is now down to 2 L with O2 saturations 100% ?- Lower extremity edema more noticeable on left, patient did have lower extremity dopplers at SNF that were negative for DVT (and patient on Xarelto)  ?- Dry weight suspected to be approx 135 lbs, patient was 169 lbs on presentation and has decreased to 162 pounds on 417.  There is been no weight to the last 2 days I will ask nursing to do this ?- She put out 550 cc yesterday and is net -1.5 L but does not appear complete ?- Serum creatinine stable at 0.77 and potassium 0.8 today ?- started on Diamox with improvement in CO2 from 45 to 35 this am (26 in mid March) ?- Strict I/Os, daily weights. Patient understand fluid restriction   ?- I think she is near euvolemia although BNP remains elevated at 432 today ?- Continue Toprol-XL 100 mg twice daily, Lasix 80 mg twice daily and Diamox 250 mg twice daily ?- continue compression hose for pedal edema ?- Replete potassium to keep greater than 4 ?  ?Chronic Atrial Fibrillation  ?-HR controlled on tele and BP stable at 110/50mHg ?-few 2.5 second pauses and heart rate dropped in the 50s while awake ?-Continue Cardizem CD 360 mg daily, Toprol-XL 100 mg twice daily  ?-Stop digoxin due to intermittent bradycardia and sinus  pauses ?-Continue home Xarelto 15 mg daily (dosed for reduced creatinine clearance 40.9 mL/min). Patient has had multiple falls recently. Patient's CHA2DS2-VASc = 7 (CHF, HTN, DM, vascular disease, female, age x2).  ? ?NSVT

## 2021-09-09 NOTE — Progress Notes (Signed)
Pt had 2.94 sec atrail pause, dr. Posey Pronto notified at 1219, 09/09/21. Awaiting callback and/or new orders.  ?.me ?

## 2021-09-10 DIAGNOSIS — E873 Alkalosis: Secondary | ICD-10-CM | POA: Diagnosis not present

## 2021-09-10 DIAGNOSIS — I482 Chronic atrial fibrillation, unspecified: Secondary | ICD-10-CM | POA: Diagnosis not present

## 2021-09-10 DIAGNOSIS — I10 Essential (primary) hypertension: Secondary | ICD-10-CM | POA: Diagnosis not present

## 2021-09-10 DIAGNOSIS — I5033 Acute on chronic diastolic (congestive) heart failure: Secondary | ICD-10-CM | POA: Diagnosis not present

## 2021-09-10 LAB — BASIC METABOLIC PANEL
Anion gap: 5 (ref 5–15)
BUN: 30 mg/dL — ABNORMAL HIGH (ref 8–23)
CO2: 38 mmol/L — ABNORMAL HIGH (ref 22–32)
Calcium: 8.8 mg/dL — ABNORMAL LOW (ref 8.9–10.3)
Chloride: 95 mmol/L — ABNORMAL LOW (ref 98–111)
Creatinine, Ser: 0.87 mg/dL (ref 0.44–1.00)
GFR, Estimated: >60 mL/min (ref >60)
Glucose, Bld: 134 mg/dL — ABNORMAL HIGH (ref 70–99)
Potassium: 3.9 mmol/L (ref 3.5–5.1)
Sodium: 138 mmol/L (ref 135–145)

## 2021-09-10 LAB — GLUCOSE, CAPILLARY
Glucose-Capillary: 111 mg/dL — ABNORMAL HIGH (ref 70–99)
Glucose-Capillary: 240 mg/dL — ABNORMAL HIGH (ref 70–99)
Glucose-Capillary: 139 mg/dL — ABNORMAL HIGH (ref 70–99)
Glucose-Capillary: 200 mg/dL — ABNORMAL HIGH (ref 70–99)

## 2021-09-10 MED ORDER — FUROSEMIDE 10 MG/ML IJ SOLN
80.0000 mg | Freq: Once | INTRAMUSCULAR | Status: AC
  Administered 2021-09-10: 80 mg via INTRAVENOUS
  Filled 2021-09-10: qty 8

## 2021-09-10 MED ORDER — LIP MEDEX EX OINT
1.0000 "application " | TOPICAL_OINTMENT | CUTANEOUS | Status: DC | PRN
  Administered 2021-09-10: 1 via TOPICAL
  Filled 2021-09-10: qty 7

## 2021-09-10 NOTE — Progress Notes (Signed)
Orthopedic Tech Progress Note ?Patient Details:  ?Tara Bradley ?1923-05-12 ?[id number] ? ?Ortho Devices ?Type of Ortho Device: Unna boot ?Ortho Device/Splint Location: ble ?Ortho Device/Splint Interventions: Ordered, Application ?  ?Post Interventions ?Patient Tolerated: Well ? ?Charline Bills Tara Bradley ?09/10/2021, 11:38 AM ?Applied unna boots. ?

## 2021-09-10 NOTE — Progress Notes (Signed)
SATURATION QUALIFICATIONS: (This note is used to comply with regulatory documentation for home oxygen) ? ?Patient Saturations on 2 liters at Rest = 98 ? ?Patient Saturations on 2 Liters of oxygen while Ambulating = 85% ? ?Please briefly explain why patient needs home oxygen: ?

## 2021-09-10 NOTE — TOC Progression Note (Addendum)
Transition of Care (TOC) - Progression Note  ? ? ?Patient Details  ?Name: Tara Bradley ?MRN: [id number] ?Date of Birth: 1922/11/21 ? ?Transition of Care (TOC) CM/SW Contact  ?Purcell Mouton, RN ?Phone Number: ?09/10/2021, 2:15 PMSt ? ?Clinical Narrative:    ? ?Spoke with pt who agreed with returning to [facility]. Insurance auth started for [employer].  ? ?Expected Discharge Plan: [place] ?Barriers to Discharge: No Barriers Identified ? ?Expected Discharge Plan and Services ?Expected Discharge Plan: [place] ?  ?Discharge Planning Services: CM Consult ?  ?Living arrangements for the past 2 months: [place] ?                ?  ?  ?  ?  ?  ?  ?  ?  ?  ?  ? ? ?Social Determinants of Health (SDOH) Interventions ?  ? ?Readmission Risk Interventions ? ?  05/11/2021  ?  1:11 PM  ?Readmission Risk Prevention Plan  ?Transportation Screening Complete  ?Home Care Screening Complete  ? ? ?

## 2021-09-10 NOTE — Progress Notes (Signed)
?  Progress Note ?Patient: Tara Bradley BPZ:[id number] DOB: 08-Sep-1922 DOA: 09/02/2021  ?DOS: the patient was seen and examined on 09/10/2021 ? ?Brief hospital course: ?88 y.o. female with medical history significant of past medical history of chronic atrial fibrillation on Xarelto, diastolic CHF ef 24% M3NT, DM 2, HLD, HTN, osteoporosis, thoracic aortic aneurysm, DM 2 comes to the hospital from cardiology office for evaluation of shortness of breath.  Patient was diagnosed CHF exacerbation and started on diuretics.  Cardiology team was consulted.  Patient has been getting aggressively diuresed during the hospitalization. ?Assessment and Plan: ?Acute on chronic diastolic CHF (congestive heart failure) ([place]) ?Aortic valve sclerosis ?Acute respiratory failure with hypoxia ([place]) ?Bilateral pleural effusion ?Chronic atrial fibrillation (HCC) ?Complaints of cough and shortness of breath. ?Recent echocardiogram shows preserved EF. ?Cardiology consulted. ?Treated with IV Lasix. ?Currently on p.o. Lasix. ?Started on Diamox due to worsening bicarb. ?Currently appears to be volume overloaded.  Will monitor. ?Still on 4 L of oxygen, at baseline does not use any oxygen. ?Continue incentive spirometry.  We will attempt 1 more dose of IV Lasix.  Appreciate cardiology assistance. ?  ?Uncontrolled type 2 diabetes mellitus with hyperglycemia, with long-term current use of insulin ([place]) ?Diabetic neuropathy ([place]) ?Hemoglobin A1c 8.6. ?Currently on sliding scale insulin. ?Continue basal bolus regimen with insulin regimen ?Holding metformin. ?  ?Essential hypertension ?Blood pressure relatively stable. ?Currently on Cardizem and Toprol-XL. ?In the setting of bradycardia we will be holding Toprol-XL for tonight. ?  ?Osteoarthritis ?Recent hip and shoulder fracture. ?Continue PT OT consultation. ?Mobilize as needed. ?  ?Hyperlipidemia ?Not on statin. ?  ?Thoracic aortic aneurysm ([place]) ?Previously seen by CT surgery. ?No intervention or  follow-up recommended based on her age. ? ?Subjective: No nausea no vomiting no fever no chills.  Continues to have shortness of breath.  Continues to have cough and swelling of the leg. ? ?Physical Exam: ?Vitals:  ? 09/10/21 0829 09/10/21 1021 09/10/21 1450 09/10/21 1944  ?BP:   (!) 109/52 (!) 120/53  ?Pulse:   72 83  ?Resp:   17 20  ?Temp:   98.6 ?F (37 ?C) 97.9 ?F (36.6 ?C)  ?TempSrc:   Oral   ?SpO2: 94%  91% 98%  ?Weight:  71.8 kg    ?Height:      ? ?General: Appear in moderate distress; no visible Abnormal Neck Mass Or lumps, Conjunctiva normal ?Cardiovascular: S1 and S2 Present, aortic systolic Murmur, ?Respiratory: increased respiratory effort, Bilateral Air entry present and  bilateral Crackles, no wheezes ?Abdomen: Bowel Sound present, Non tender ?Extremities: bilateral Pedal edema ?Neurology: alert and oriented to time, place, and person ?Gait not checked due to patient safety concerns  ? ?Data Reviewed: ?I have Reviewed nursing notes, Vitals, and Lab results since pt's last encounter. Pertinent lab results CBC and BMP ?I have ordered test including CBC, BMP, chest x-ray ?I have discussed pt's care plan and test results with cardiology.  ? ?Family Communication: Family at bedside. ? ?Disposition: ?Status is: Inpatient ?Remains inpatient appropriate because: Ongoing issues with hypoxia and volume overload requiring IV diuresis. ? ?Author: ?Berle Mull, MD ?09/10/2021 7:45 PM ? ?Please look on [email or web address] to find out who is on call. ?

## 2021-09-10 NOTE — Progress Notes (Signed)
Physical Therapy Treatment ?Patient Details ?Name: Tara Bradley ?MRN: [id number] ?DOB: 1923/02/07 ?Today's Date: 09/10/2021 ? ? ?History of Present Illness Tara Bradley is a 88 y.o. female with medical history significant of past medical history of chronic atrial fibrillation on Xarelto, diastolic CHF ef 71% I9CV, DM 2, HLD, HTN, osteoporosis, thoracic aortic aneurysm, DM 2 comes to the hospital from cardiology office for evaluation of shortness of breath.BNP is elevated, chest x-ray suggestive of pulmonary edema/effusion ? ?  ?PT Comments  ? ? Pt participated well. Continues to have dyspnea and drop in O2 sats with activity. O2 95% 1L at rest, 87% on 2L with ambulation. Cues for pursed lip/deep breathing during recovery. Continue to recommend ST SNF rehab to improve functional mobility and regain independence prior to returning home where she lives alone.  ?   ?Recommendations for follow up therapy are one component of a multi-disciplinary discharge planning process, led by the attending physician.  Recommendations may be updated based on patient status, additional functional criteria and insurance authorization. ? ?Follow Up Recommendations ? Skilled nursing-short term rehab (<3 hours/day) ?  ?  ?Assistance Recommended at Discharge Frequent or constant Supervision/Assistance  ?Patient can return home with the following A little help with walking and/or transfers;A little help with bathing/dressing/bathroom;Help with stairs or ramp for entrance;Assist for transportation;Assistance with cooking/housework ?  ?Equipment Recommendations ? None recommended by PT  ?  ?Recommendations for Other Services   ? ? ?  ?Precautions / Restrictions Precautions ?Precautions: Fall ?Precaution Comments: SOB, monitor O2 and HR ?Restrictions ?Weight Bearing Restrictions: No  ?  ? ?Mobility ? Bed Mobility ?  ?  ?  ?  ?  ?  ?  ?General bed mobility comments: oob in recliner ?  ? ?Transfers ?Overall transfer level: Needs  assistance ?Equipment used: Rolling walker (2 wheels) ?Transfers: Sit to/from Stand ?Sit to Stand: Min guard ?  ?  ?  ?  ?  ?General transfer comment: Inreased time and effort but pt able to perform with close Min guard. Cues for technique, hand placement. ?  ? ?Ambulation/Gait ?Ambulation/Gait assistance: Min assist ?Gait Distance (Feet): 30 Feet ?Assistive device: Rolling walker (2 wheels) ?Gait Pattern/deviations: Step-through pattern, Decreased stride length, Trunk flexed ?  ?  ?  ?General Gait Details: Min A to steady throughout distance. Dyspnea 3/4 towards end of distance. O2 87% on 2L. ? ? ?Stairs ?  ?  ?  ?  ?  ? ? ?Wheelchair Mobility ?  ? ?Modified Rankin (Stroke Patients Only) ?  ? ? ?  ?Balance Overall balance assessment: Needs assistance ?  ?  ?  ?  ?Standing balance support: Bilateral upper extremity supported, During functional activity, Reliant on assistive device for balance ?Standing balance-Leahy Scale: Poor ?  ?  ?  ?  ?  ?  ?  ?  ?  ?  ?  ?  ?  ? ?  ?Cognition Arousal/Alertness: Awake/alert ?Behavior During Therapy: [facility] for tasks assessed/performed ?Overall Cognitive Status: Within Functional Limits for tasks assessed ?  ?  ?  ?  ?  ?  ?  ?  ?  ?  ?  ?  ?  ?  ?  ?  ?  ?  ?  ? ?  ?Exercises   ? ?  ?General Comments   ?  ?  ? ?Pertinent Vitals/Pain Pain Assessment ?Pain Assessment: Faces ?Faces Pain Scale: Hurts little more ?Pain Location: back ?Pain Descriptors / Indicators: Discomfort, Sore ?Pain  Intervention(s): Limited activity within patient's tolerance, Monitored during session, Repositioned  ? ? ?Home Living   ?  ?  ?  ?  ?  ?  ?  ?  ?  ?   ?  ?Prior Function    ?  ?  ?   ? ?PT Goals (current goals can now be found in the care plan section) Progress towards PT goals: Progressing toward goals ? ?  ?Frequency ? ? ? Min 2X/week ? ? ? ?  ?PT Plan Current plan remains appropriate  ? ? ?Co-evaluation   ?  ?  ?  ?  ? ?  ?AM-PAC PT "6 Clicks" Mobility   ?Outcome Measure ? Help needed turning from  your back to your side while in a flat bed without using bedrails?: A Little ?Help needed moving from lying on your back to sitting on the side of a flat bed without using bedrails?: A Little ?Help needed moving to and from a bed to a chair (including a wheelchair)?: A Little ?Help needed standing up from a chair using your arms (e.g., wheelchair or bedside chair)?: A Little ?Help needed to walk in hospital room?: A Little ?Help needed climbing 3-5 steps with a railing? : A Lot ?6 Click Score: 17 ? ?  ?End of Session Equipment Utilized During Treatment: Gait belt;Oxygen ?Activity Tolerance: Patient limited by fatigue (limited by drop in O2, dyspnea on exertion) ?Patient left: in chair;with call bell/phone within reach;with family/visitor present ?  ?PT Visit Diagnosis: Muscle weakness (generalized) (M62.81);Difficulty in walking, not elsewhere classified (R26.2) ?  ? ? ?Time: 7106-2694 ?PT Time Calculation (min) (ACUTE ONLY): 18 min ? ?Charges:  $Gait Training: 8-22 mins          ?          ? ? ? ? ? ?Doreatha Massed, PT ?Acute Rehabilitation  ?Office: [phone] ?Pager: [phone] ? ?  ? ?

## 2021-09-10 NOTE — Progress Notes (Signed)
PT Cancellation Note ? ?Patient Details ?Name: Tara Bradley ?MRN: [id number] ?DOB: 1922-09-19 ? ? ?Cancelled Treatment:    Reason Eval/Treat Not Completed:  Pt currently eating lunch. Will return for PT session. ? ? ? ?Doreatha Massed, PT ?Acute Rehabilitation  ?Office: [phone] ?Pager: [phone] ? ?  ?

## 2021-09-10 NOTE — Progress Notes (Addendum)
? ?Progress Note ? ?Patient Name: Tara Bradley ?Date of Encounter: 09/10/2021 ? ?[facility] Cardiologist: [name], MD  ? ?Subjective  ? ?O2 requirements have increased and she is now on 4 L O2.  Denies any chest pain or shortness of breath.  Toprol decreased yesterday to 100 mg daily from twice daily due to bradycardia despite stopping dig ?Inpatient Medications  ?  ?Scheduled Meds: ? acetaZOLAMIDE  250 mg Oral BID  ? diltiazem  360 mg Oral Daily  ? feeding supplement  1 Container Oral TID BM  ? ferrous gluconate  324 mg Oral BID WC  ? furosemide  80 mg Oral BID  ? gabapentin  100 mg Oral TID  ? insulin aspart  0-9 Units Subcutaneous TID WC  ? insulin glargine-yfgn  10 Units Subcutaneous Daily  ? mouth rinse  15 mL Mouth Rinse BID  ? metoprolol succinate  100 mg Oral Daily  ? potassium chloride  20 mEq Oral Daily  ? Rivaroxaban  15 mg Oral Q supper  ? senna  1 tablet Oral BID  ? tamsulosin  0.4 mg Oral Daily  ? ?Continuous Infusions: ? ?PRN Meds: ?albuterol, dextromethorphan-guaiFENesin, guaiFENesin, hydrALAZINE, ipratropium-albuterol, ondansetron (ZOFRAN) IV, senna-docusate, traZODone  ? ?Vital Signs  ?  ?Vitals:  ? 09/10/21 0557 09/10/21 0806 09/10/21 0829 09/10/21 1021  ?BP: 130/69     ?Pulse: 75     ?Resp: 18     ?Temp: 98 ?F (36.7 ?C)     ?TempSrc: Oral     ?SpO2: 100% 100% 94%   ?Weight:    71.8 kg  ?Height:      ? ? ?Intake/Output Summary (Last 24 hours) at 09/10/2021 1123 ?Last data filed at 09/10/2021 1021 ?Gross per 24 hour  ?Intake 560 ml  ?Output 550 ml  ?Net 10 ml  ? ? ? ?  09/10/2021  ? 10:21 AM 09/10/2021  ?  5:00 AM 09/07/2021  ?  4:37 AM  ?Last 3 Weights  ?Weight (lbs) 158 lb 4.6 oz 163 lb 2.3 oz 162 lb 4.1 oz  ?Weight (kg) 71.8 kg 74 kg 73.6 kg  ?   ? ?Telemetry  ?  ?Atrial fibrillation with controlled ventricular responseb and occasional PVCs- Personally Reviewed ? ?ECG  ?  ?No new tracings since 4/12 - Personally Reviewed ? ?Physical Exam  ? ?GEN: Well nourished, well developed in no  acute distress ?HEENT: Normal ?NECK: No JVD; No carotid bruits ?LYMPHATICS: No lymphadenopathy ?CARDIAC:, no murmurs, rubs, gallops ?RESPIRATORY: Crackles bilaterally at the bases ?ABDOMEN: Soft, non-tender, non-distended ?MUSCULOSKELETAL:  No edema; No deformity  ?SKIN: Warm and dry ?NEUROLOGIC:  Alert and oriented x 3 ?PSYCHIATRIC:  Normal affect   ?Cc she is back up to 4 L ?High Sensitivity Troponin:   ?Recent Labs  ?Lab 08/27/21 ?2334 08/28/21 ?0938 09/02/21 ?1318 09/02/21 ?2118  ?TROPONINIHS '7 8 9 9  '$ ? ?   ?Chemistry ?Recent Labs  ?Lab 09/04/21 ?1829 09/05/21 ?0403 09/06/21 ?0345 09/07/21 ?0350 09/08/21 ?9371 09/09/21 ?6967 09/10/21 ?0356  ?NA 138 138   < > 140 138 136 138  ?K 4.5 3.9   < > 3.4* 3.1* 3.8 3.9  ?CL 95* 90*   < > 89* 93* 94* 95*  ?CO2 37* 42*   < > 45* 40* 35* 38*  ?GLUCOSE 129* 123*   < > 180* 118* 166* 134*  ?BUN 26* 25*   < > 21 22 27* 30*  ?CREATININE 0.80 0.81   < > 0.68 0.70 0.77  0.87  ?CALCIUM 8.8* 8.6*   < > 8.7* 8.6* 8.4* 8.8*  ?MG 2.0 1.9   < > 1.8 1.9 2.1  --   ?PROT 5.4* 5.9*  --   --   --   --   --   ?ALBUMIN 2.9* 3.2*  --   --   --   --   --   ?AST 12* 12*  --   --   --   --   --   ?ALT 7 6  --   --   --   --   --   ?ALKPHOS 141* 144*  --   --   --   --   --   ?BILITOT 0.7 0.7  --   --   --   --   --   ?GFRNONAA >60 >60   < > >60 >60 >60 >60  ?ANIONGAP 6 6   < > '6 5 7 5  '$ ? < > = values in this interval not displayed.  ? ?  ?Lipids No results for input(s): CHOL, TRIG, HDL, LABVLDL, LDLCALC, CHOLHDL in the last 168 hours.  ?Hematology ?Recent Labs  ?Lab 09/05/21 ?0403 09/06/21 ?0345 09/09/21 ?0416  ?WBC 6.0 6.3 5.7  ?RBC 3.18* 3.21* 2.89*  ?HGB 9.5* 9.5* 8.8*  ?HCT 31.6* 31.6* 29.3*  ?MCV 99.4 98.4 101.4*  ?MCH 29.9 29.6 30.4  ?MCHC 30.1 30.1 30.0  ?RDW 17.0* 16.8* 16.6*  ?PLT 283 284 234  ? ? ?Thyroid No results for input(s): TSH, FREET4 in the last 168 hours.  ?BNP ?Recent Labs  ?Lab 09/08/21 ?7902  ?BNP 432.8*  ? ?  ?DDimer  ?Recent Labs  ?Lab 09/04/21 ?1603  ?DDIMER 2.89*  ? ?   ? ?Radiology  ?  ?DG Chest 2 View ? ?Result Date: 09/09/2021 ?CLINICAL DATA:  Shortness of breath EXAM: CHEST - 2 VIEW COMPARISON:  Previous studies including the examination of 09/02/2021 FINDINGS: Transverse diameter of heart is increased. Central pulmonary vessels are less prominent. Slightly increased interstitial markings are seen in the right lung. There is blunting of both lateral CP angles suggesting small effusions, more so on the left side. There is interval decrease in amount of bilateral pleural effusions. Evaluation of lower lung fields for infiltrates is limited by the effusions. There is no pneumothorax. There is previous right shoulder arthroplasty. Degenerative changes are noted in the left shoulder. Old healed fractures are noted in the bilateral ribs. IMPRESSION: Cardiomegaly. Small bilateral pleural effusions with interval decrease. There is decrease in central pulmonary vascular congestion. Residual increased interstitial markings are seen in the right lung suggesting asymmetric pulmonary edema or pneumonia. Evaluation of lower lung fields for infiltrates is limited by the pleural effusions. Electronically Signed   By: Elmer Picker M.D.   On: 09/09/2021 15:00   ? ?Cardiac Studies  ? ?Echocardiogram on 06/23/21  ? 1. Left ventricular ejection fraction, by estimation, is 55 to 60%. The  ?left ventricle has normal function. The left ventricle has no regional  ?wall motion abnormalities. Left ventricular diastolic parameters are  ?consistent with Grade II diastolic  ?dysfunction (pseudonormalization).  ? 2. Right ventricular systolic function is normal. The right ventricular  ?size is normal. There is moderately elevated pulmonary artery systolic  ?pressure. The estimated right ventricular systolic pressure is 40.9 mmHg.  ? 3. Left atrial size was moderately dilated.  ? 4. Right atrial size was moderately dilated.  ? 5. The mitral valve is normal in structure. Mild mitral valve   ?  regurgitation. No evidence of mitral stenosis.  ? 6. Tricuspid valve regurgitation is moderate to severe.  ? 7. The aortic valve is tricuspid. There is mild calcification of the  ?aortic valve. Aortic valve regurgitation is not visualized. Aortic valve  ?sclerosis is present, with no evidence of aortic valve stenosis.  ? 8. The inferior vena cava is normal in size with <50% respiratory  ?variability, suggesting right atrial pressure of 8 mmHg.  ? ?Comparison(s): No significant change from prior study. Prior images  ?reviewed side by side.  ? ?Patient Profile  ?   ?88 y.o. female with a hx of chronic atrial fibrillation on Xarelto, chronic diastolic heart failure, aortic aneurysm with chronic descending dissection, aortic valve sclerosis, hypertension, hyperlipidemia, type 2 DM who was seen 09/03/2021 for the evaluation of CHF at the request of Dr. Reesa Chew. ? ?Assessment & Plan  ?  ?Acute on Chronic Diastolic Heart Failure  ?- Patient presented complaining of new dyspnea on exertion, orthopnea, some PND, pedal edema. Most recent echocardiogram findings above. At  last office visit on 08/18/2021, patient had evidence of volume overload and BNP came back elevated in the 400s; therefore, Lasix was increased for 1 week.  Since then, she presented to the ED on 08/27/2021 for progressive shortness of breath. BNP was higher in the 600s and chest x-ray showed cardiomegaly with central pulmonary vascular congestion, small bilateral pleural effusions, and bibasilar infiltrates.  Given IV lasix with improvement.  ?- BNP this admission is elevated but improved to 495.4  ?- CXR this admission showed interval improvement in pulmonary edema, small bibasilar pleural effusions and bibasilar atelectasis  ?- Oxygen requirement were improving and he is on 2 L nasal cannula with 100% O2 sats at rest but requires 4 L when ambulating ?- Lower extremity edema more noticeable on left, patient did have lower extremity dopplers at SNF that were  negative for DVT (and patient on Xarelto)  ?- Dry weight suspected to be approx 135 lbs, patient was 169 lbs on presentation and has decreased to 162 pounds on 417.  Weight today is 158 pounds which is down 11 pounds from ad

## 2021-09-11 ENCOUNTER — Inpatient Hospital Stay (HOSPITAL_COMMUNITY): Payer: Medicare Other

## 2021-09-11 DIAGNOSIS — I5033 Acute on chronic diastolic (congestive) heart failure: Secondary | ICD-10-CM | POA: Diagnosis not present

## 2021-09-11 DIAGNOSIS — I10 Essential (primary) hypertension: Secondary | ICD-10-CM | POA: Diagnosis not present

## 2021-09-11 DIAGNOSIS — I482 Chronic atrial fibrillation, unspecified: Secondary | ICD-10-CM | POA: Diagnosis not present

## 2021-09-11 DIAGNOSIS — E873 Alkalosis: Secondary | ICD-10-CM | POA: Diagnosis not present

## 2021-09-11 LAB — BASIC METABOLIC PANEL
Anion gap: 7 (ref 5–15)
BUN: 30 mg/dL — ABNORMAL HIGH (ref 8–23)
CO2: 38 mmol/L — ABNORMAL HIGH (ref 22–32)
Calcium: 8.9 mg/dL (ref 8.9–10.3)
Chloride: 95 mmol/L — ABNORMAL LOW (ref 98–111)
Creatinine, Ser: 0.88 mg/dL (ref 0.44–1.00)
GFR, Estimated: 60 mL/min — ABNORMAL LOW (ref >60)
Glucose, Bld: 110 mg/dL — ABNORMAL HIGH (ref 70–99)
Potassium: 3.4 mmol/L — ABNORMAL LOW (ref 3.5–5.1)
Sodium: 140 mmol/L (ref 135–145)

## 2021-09-11 LAB — CBC WITH DIFFERENTIAL/PLATELET
Abs Immature Granulocytes: 0.01 10*3/uL (ref 0.00–0.07)
Basophils Absolute: 0 10*3/uL (ref 0.0–0.1)
Basophils Relative: 1 %
Eosinophils Absolute: 0.2 10*3/uL (ref 0.0–0.5)
Eosinophils Relative: 4 %
HCT: 29.7 % — ABNORMAL LOW (ref 36.0–46.0)
Hemoglobin: 9 g/dL — ABNORMAL LOW (ref 12.0–15.0)
Immature Granulocytes: 0 %
Lymphocytes Relative: 10 %
Lymphs Abs: 0.6 10*3/uL — ABNORMAL LOW (ref 0.7–4.0)
MCH: 30.1 pg (ref 26.0–34.0)
MCHC: 30.3 g/dL (ref 30.0–36.0)
MCV: 99.3 fL (ref 80.0–100.0)
Monocytes Absolute: 0.7 10*3/uL (ref 0.1–1.0)
Monocytes Relative: 12 %
Neutro Abs: 4.2 10*3/uL (ref 1.7–7.7)
Neutrophils Relative %: 73 %
Platelets: 261 10*3/uL (ref 150–400)
RBC: 2.99 MIL/uL — ABNORMAL LOW (ref 3.87–5.11)
RDW: 16.6 % — ABNORMAL HIGH (ref 11.5–15.5)
WBC: 5.7 10*3/uL (ref 4.0–10.5)
nRBC: 0 % (ref 0.0–0.2)

## 2021-09-11 LAB — GLUCOSE, CAPILLARY
Glucose-Capillary: 98 mg/dL (ref 70–99)
Glucose-Capillary: 166 mg/dL — ABNORMAL HIGH (ref 70–99)
Glucose-Capillary: 177 mg/dL — ABNORMAL HIGH (ref 70–99)
Glucose-Capillary: 195 mg/dL — ABNORMAL HIGH (ref 70–99)

## 2021-09-11 LAB — BRAIN NATRIURETIC PEPTIDE: B Natriuretic Peptide: 442.2 pg/mL — ABNORMAL HIGH (ref 0.0–100.0)

## 2021-09-11 LAB — PROCALCITONIN: Procalcitonin: <0.1 ng/mL

## 2021-09-11 MED ORDER — FUROSEMIDE 10 MG/ML IJ SOLN
40.0000 mg | Freq: Once | INTRAMUSCULAR | Status: AC
  Administered 2021-09-11: 40 mg via INTRAVENOUS
  Filled 2021-09-11: qty 4

## 2021-09-11 MED ORDER — POTASSIUM CHLORIDE CRYS ER 20 MEQ PO TBCR
20.0000 meq | EXTENDED_RELEASE_TABLET | Freq: Once | ORAL | Status: AC
  Administered 2021-09-11: 20 meq via ORAL
  Filled 2021-09-11: qty 1

## 2021-09-11 MED ORDER — TRAMADOL HCL 50 MG PO TABS
50.0000 mg | ORAL_TABLET | Freq: Four times a day (QID) | ORAL | Status: DC | PRN
  Administered 2021-09-11 – 2021-09-15 (×4): 50 mg via ORAL
  Filled 2021-09-11 (×4): qty 1

## 2021-09-11 MED ORDER — DICLOFENAC SODIUM 1 % EX GEL
2.0000 g | Freq: Four times a day (QID) | CUTANEOUS | Status: DC
Start: 2021-09-11 — End: 2021-09-11
  Filled 2021-09-11: qty 100

## 2021-09-11 MED ORDER — FUROSEMIDE 10 MG/ML IJ SOLN
80.0000 mg | Freq: Two times a day (BID) | INTRAMUSCULAR | Status: DC
  Administered 2021-09-11 – 2021-09-14 (×6): 80 mg via INTRAVENOUS
  Filled 2021-09-11 (×6): qty 8

## 2021-09-11 NOTE — Progress Notes (Signed)
?  Progress Note ?Patient: Tara Bradley KXF:[id number] DOB: 1923-01-17 DOA: 09/02/2021  ?DOS: the patient was seen and examined on 09/11/2021 ? ?Brief hospital course: ?88 y.o. female with medical history significant of past medical history of chronic atrial fibrillation on Xarelto, diastolic CHF ef 69% C7EL, DM 2, HLD, HTN, osteoporosis, thoracic aortic aneurysm, DM 2 comes to the hospital from cardiology office for evaluation of shortness of breath.  Patient was diagnosed CHF exacerbation and started on diuretics.  Cardiology team was consulted.  Patient has been getting aggressively diuresed during the hospitalization. ?Assessment and Plan: ?Acute on chronic diastolic CHF (congestive heart failure) ([place]) ?Aortic valve sclerosis ?Acute respiratory failure with hypoxia ([place]) ?Bilateral pleural effusion ?Chronic atrial fibrillation ([facility]) ?Complaints of cough and shortness of breath. ?Recent echocardiogram shows preserved EF. ?Cardiology consulted.  Highly appreciate their assistance. ?Continue aggressive IV Lasix ?Started on Diamox due to worsening bicarb. ?Currently appears to be volume overloaded.  Will monitor. ?Still on 4 L of oxygen, at baseline does not use any oxygen. ?Continue incentive spirometry.   ?4/21 early in the morning developed hypoxia requiring BiPAP therapy.  Now back to home oxygen. ?  ?Uncontrolled type 2 diabetes mellitus with hyperglycemia, with long-term current use of insulin ([place]) ?Diabetic neuropathy ([place]) ?Hemoglobin A1c 8.6. ?Currently on sliding scale insulin. ?Continue basal bolus regimen with insulin regimen ?Holding metformin. ?  ?Essential hypertension ?Blood pressure relatively stable. ?Currently on Cardizem and Toprol-XL. ?In the setting of bradycardia we will be holding Toprol-XL for tonight. ?  ?Osteoarthritis ?Recent hip and shoulder fracture. ?Continue PT OT consultation. ?Mobilize as needed. ?  ?Hyperlipidemia ?Not on statin. ?  ?Thoracic aortic aneurysm ([place]) ?Previously seen  by CT surgery. ?No intervention or follow-up recommended based on her age. ? ?Subjective: Overnight becomes short of breath requiring BiPAP therapy in the morning.  Currently back to baseline.  No nausea vomiting or no chest pain. ? ?Physical Exam: ?Vitals:  ? 09/11/21 0705 09/11/21 0939 09/11/21 0954 09/11/21 1300  ?BP:   135/69 (!) 120/55  ?Pulse: 86  88 (!) 57  ?Resp: (!) 23  (!) 26 20  ?Temp:   97.6 ?F (36.4 ?C) 97.7 ?F (36.5 ?C)  ?TempSrc:   Oral Oral  ?SpO2: 99% 95% 95% 99%  ?Weight:      ?Height:      ? ?General: Appear in mild distress; no visible Abnormal Neck Mass Or lumps, Conjunctiva normal ?Cardiovascular: S1 and S2 Present, aortic systolic  Murmur, ?Respiratory: increased respiratory effort, Bilateral Air entry present and  bilateral  Crackles, no wheezes ?Abdomen: Bowel Sound present, Non tender  ?Extremities: bilateral  Pedal edema ?Neurology: alert and oriented to time, place, and person ?Gait not checked due to patient safety concerns  ? ?Data Reviewed: ?I have Reviewed nursing notes, Vitals, and Lab results since pt's last encounter. Pertinent lab results CBC and BMP and procalcitonin ?I have ordered test including CBC and BMP and procalcitonin ?I have discussed pt's care plan and test results with cardiology.  ? ?Family Communication: Family at bedside ? ?Disposition: ?Status is: Inpatient ?Remains inpatient appropriate because: Ongoing issues with hypoxia requiring aggressive IV diuretic therapy for heart failure. ? ?Author: ?Berle Mull, MD ?09/11/2021 7:27 PM ? ?Please look on [email or web address] to find out who is on call. ?

## 2021-09-11 NOTE — Progress Notes (Signed)
Patient woke with increased work of breathing.  Oxygen saturations >94% on 2L.  MD on call notified and orders received for lasix and bipap.  MD stopped at bedside to assess pt.  RRT notified of need for bipap.  Will administer treatments as ordered and continue to monitor.  ?

## 2021-09-11 NOTE — Progress Notes (Signed)
Occupational Therapy Treatment ?Patient Details ?Name: Tara Bradley ?MRN: [id number] ?DOB: 1922/10/06 ?Today's Date: 09/11/2021 ? ? ?History of present illness Tara Bradley is a 88 y.o. female with medical history significant of past medical history of chronic atrial fibrillation on Xarelto, diastolic CHF ef 68% T4HD, DM 2, HLD, HTN, osteoporosis, thoracic aortic aneurysm, DM 2 comes to the hospital from cardiology office for evaluation of shortness of breath.BNP is elevated, chest x-ray suggestive of pulmonary edema/effusion ?  ?OT comments ? Patient had episode of hypoxia this morning requiring Lasix and Bipap but Rn reports patient stable for therapy. Of note - patient has unna boots that were applied yesterday.  Patient on 3 L [place] with sat 99% but reports dyspnea with movement. Patient able to ambulate to bathroom perform toileting,stand and wash hands and then return to recliner. No significant drop in o2 sat but patient reports feeling short of breath. Will continue to follow acutely.  ? ?Recommendations for follow up therapy are one component of a multi-disciplinary discharge planning process, led by the attending physician.  Recommendations may be updated based on patient status, additional functional criteria and insurance authorization. ?   ?Follow Up Recommendations ? Skilled nursing-short term rehab (<3 hours/day)  ?  ?Assistance Recommended at Discharge Frequent or constant Supervision/Assistance  ?Patient can return home with the following ? A little help with walking and/or transfers;A lot of help with bathing/dressing/bathroom;Assistance with cooking/housework;Help with stairs or ramp for entrance;Assist for transportation ?  ?Equipment Recommendations ? None recommended by OT  ?  ?Recommendations for Other Services   ? ?  ?Precautions / Restrictions Precautions ?Precautions: Fall ?Precaution Comments: SOB, monitor O2 and HR ?Restrictions ?Weight Bearing Restrictions: No  ? ? ?  ? ?Mobility Bed  Mobility ?  ?  ?  ?  ?  ?  ?  ?  ?  ? ?Transfers ?  ?  ?  ?  ?  ?  ?  ?  ?  ?  ?  ?  ?Balance Overall balance assessment: Needs assistance ?Sitting-balance support: No upper extremity supported, Feet supported ?Sitting balance-Leahy Scale: Good ?  ?  ?Standing balance support: During functional activity ?Standing balance-Leahy Scale: Poor ?  ?  ?  ?  ?  ?  ?  ?  ?  ?  ?  ?  ?   ? ?ADL either performed or assessed with clinical judgement  ? ?ADL Overall ADL's : Needs assistance/impaired ?  ?  ?  ?  ?  ?  ?  ?  ?  ?  ?  ?  ?Toilet Transfer: Financial planner (2 wheels);Grab bars ?Toilet Transfer Details (indicate cue type and reason): min guard to ambulate to bathroom with RW on 3 L [place] - therapist managed o2 line. ?Toileting- Clothing Manipulation and Hygiene: Minimal assistance;Sit to/from stand ?Toileting - Clothing Manipulation Details (indicate cue type and reason): min assist for pericare - patient able to wipe herself but wanted therapist to assist to make sure of quality. ?  ?  ?Functional mobility during ADLs: Min guard;Rolling walker (2 wheels) ?  ?  ? ?Extremity/Trunk Assessment Upper Extremity Assessment ?Upper Extremity Assessment: Overall WFL for tasks assessed ?  ?  ?  ?  ?  ? ?Vision Patient Visual Report: No change from baseline ?  ?  ?Perception   ?  ?Praxis   ?  ? ?Cognition Arousal/Alertness: Awake/alert ?Behavior During Therapy: [facility] for tasks assessed/performed ?Overall Cognitive Status: Within Functional Limits  for tasks assessed ?  ?  ?  ?  ?  ?  ?  ?  ?  ?  ?  ?  ?  ?  ?  ?  ?General Comments: HOH ?  ?  ?   ?Exercises   ? ?  ?Shoulder Instructions   ? ? ?  ?General Comments    ? ? ?Pertinent Vitals/ Pain       Pain Assessment ?Pain Assessment: No/denies pain ? ?Home Living   ?  ?  ?  ?  ?  ?  ?  ?  ?  ?  ?  ?  ?  ?  ?  ?  ?  ?  ? ?  ?Prior Functioning/Environment    ?  ?  ?  ?   ? ?Frequency ? Min 2X/week  ? ? ? ? ?  ?Progress Toward Goals ? ?OT Goals(current goals can now  be found in the care plan section) ? Progress towards OT goals: Progressing toward goals ? ?Acute Rehab OT Goals ?Patient Stated Goal: tolerate more ?OT Goal Formulation: With patient ?Time For Goal Achievement: 09/17/21 ?Potential to Achieve Goals: Good  ?Plan Discharge plan remains appropriate   ? ?Co-evaluation ? ? ?   ?  ?  ?OT goals addressed during session: ADL's and self-care ?  ? ?  ?AM-PAC OT "6 Clicks" Daily Activity     ?Outcome Measure ? ? Help from another person eating meals?: None ?Help from another person taking care of personal grooming?: A Little ?Help from another person toileting, which includes using toliet, bedpan, or urinal?: A Little ?Help from another person bathing (including washing, rinsing, drying)?: A Little ?Help from another person to put on and taking off regular upper body clothing?: A Little ?Help from another person to put on and taking off regular lower body clothing?: A Lot ?6 Click Score: 18 ? ?  ?End of Session Equipment Utilized During Treatment: Oxygen;Rolling walker (2 wheels) ? ?OT Visit Diagnosis: Unsteadiness on feet (R26.81);Other abnormalities of gait and mobility (R26.89);Muscle weakness (generalized) (M62.81) ?  ?Activity Tolerance Patient tolerated treatment well ?  ?Patient Left in chair;with call bell/phone within reach;with chair alarm set;Other (comment);with family/visitor present ?  ?Nurse Communication Mobility status ?  ? ?   ? ?Time: 5053-9767 ?OT Time Calculation (min): 25 min ? ?Charges: OT General Charges ?$OT Visit: 1 Visit ?OT Treatments ?$Self Care/Home Management : 23-37 mins ? ?Robby Bulkley, OTR/L ?Acute Care Rehab Services  ?Office [phone] ?Pager: [phone]  ? ?Lorey Pallett L Leia Coletti ?09/11/2021, 1:44 PM ?

## 2021-09-11 NOTE — Progress Notes (Addendum)
? ?Progress Note ? ?Patient Name: Tara Bradley ?Date of Encounter: 09/11/2021 ? ?[facility] Cardiologist: [name], MD  ? ?Subjective  ? ?Patient developed respiratory distress early this morning with respiratory rate up to 30/min, given IV Lasix 40 mg and placed on BiPAP ?Inpatient Medications  ?  ?Scheduled Meds: ? acetaZOLAMIDE  250 mg Oral BID  ? diltiazem  360 mg Oral Daily  ? feeding supplement  1 Container Oral TID BM  ? ferrous gluconate  324 mg Oral BID WC  ? gabapentin  100 mg Oral TID  ? insulin aspart  0-9 Units Subcutaneous TID WC  ? insulin glargine-yfgn  10 Units Subcutaneous Daily  ? mouth rinse  15 mL Mouth Rinse BID  ? metoprolol succinate  100 mg Oral Daily  ? potassium chloride  20 mEq Oral Daily  ? potassium chloride  20 mEq Oral Once  ? Rivaroxaban  15 mg Oral Q supper  ? senna  1 tablet Oral BID  ? tamsulosin  0.4 mg Oral Daily  ? ?Continuous Infusions: ? ?PRN Meds: ?albuterol, dextromethorphan-guaiFENesin, guaiFENesin, hydrALAZINE, ipratropium-albuterol, lip balm, ondansetron (ZOFRAN) IV, senna-docusate, traZODone  ? ?Vital Signs  ?  ?Vitals:  ? 09/10/21 1021 09/10/21 1450 09/10/21 1944 09/11/21 0705  ?BP:  (!) 109/52 (!) 120/53   ?Pulse:  72 83 86  ?Resp:  17 20 (!) 23  ?Temp:  98.6 ?F (37 ?C) 97.9 ?F (36.6 ?C)   ?TempSrc:  Oral    ?SpO2:  91% 98% 99%  ?Weight: 71.8 kg     ?Height:      ? ? ?Intake/Output Summary (Last 24 hours) at 09/11/2021 0907 ?Last data filed at 09/10/2021 2113 ?Gross per 24 hour  ?Intake --  ?Output 1550 ml  ?Net -1550 ml  ? ? ? ?  09/10/2021  ? 10:21 AM 09/10/2021  ?  5:00 AM 09/07/2021  ?  4:37 AM  ?Last 3 Weights  ?Weight (lbs) 158 lb 4.6 oz 163 lb 2.3 oz 162 lb 4.1 oz  ?Weight (kg) 71.8 kg 74 kg 73.6 kg  ?   ? ?Telemetry  ?  ?Atrial fibrillation with controlled ventricular response and occasional PVCs- Personally Reviewed ? ?ECG  ?  ?No new tracings since 4/12 - Personally Reviewed ? ?Physical Exam  ? ?GEN: Well nourished, well developed in no acute  distress ?HEENT: Normal ?NECK: No JVD; No carotid bruits ?LYMPHATICS: No lymphadenopathy ?CARDIAC:RRR, no murmurs, rubs, gallops ?RESPIRATORY: Decreased breath sounds throughout with scattered crackles ?ABDOMEN: Soft, non-tender, non-distended ?MUSCULOSKELETAL: 2+ pedal edema; No deformity  ?SKIN: Warm and dry ?NEUROLOGIC:  Alert and oriented x 3 ? ?PSYCHIATRIC:  Normal affect  High Sensitivity Troponin:   ?Recent Labs  ?Lab 08/27/21 ?2334 08/28/21 ?2671 09/02/21 ?1318 09/02/21 ?2118  ?TROPONINIHS '7 8 9 9  '$ ? ?   ?Chemistry ?Recent Labs  ?Lab 09/05/21 ?0403 09/06/21 ?2458 09/07/21 ?0998 09/08/21 ?3382 09/09/21 ?5053 09/10/21 ?9767 09/11/21 ?3419  ?NA 138   < > 140 138 136 138 140  ?K 3.9   < > 3.4* 3.1* 3.8 3.9 3.4*  ?CL 90*   < > 89* 93* 94* 95* 95*  ?CO2 42*   < > 45* 40* 35* 38* 38*  ?GLUCOSE 123*   < > 180* 118* 166* 134* 110*  ?BUN 25*   < > 21 22 27* 30* 30*  ?CREATININE 0.81   < > 0.68 0.70 0.77 0.87 0.88  ?CALCIUM 8.6*   < > 8.7* 8.6* 8.4* 8.8* 8.9  ?  MG 1.9   < > 1.8 1.9 2.1  --   --   ?PROT 5.9*  --   --   --   --   --   --   ?ALBUMIN 3.2*  --   --   --   --   --   --   ?AST 12*  --   --   --   --   --   --   ?ALT 6  --   --   --   --   --   --   ?ALKPHOS 144*  --   --   --   --   --   --   ?BILITOT 0.7  --   --   --   --   --   --   ?GFRNONAA >60   < > >60 >60 >60 >60 60*  ?ANIONGAP 6   < > '6 5 7 5 7  '$ ? < > = values in this interval not displayed.  ? ?  ?Lipids No results for input(s): CHOL, TRIG, HDL, LABVLDL, LDLCALC, CHOLHDL in the last 168 hours.  ?Hematology ?Recent Labs  ?Lab 09/06/21 ?0345 09/09/21 ?4128 09/11/21 ?0353  ?WBC 6.3 5.7 5.7  ?RBC 3.21* 2.89* 2.99*  ?HGB 9.5* 8.8* 9.0*  ?HCT 31.6* 29.3* 29.7*  ?MCV 98.4 101.4* 99.3  ?MCH 29.6 30.4 30.1  ?MCHC 30.1 30.0 30.3  ?RDW 16.8* 16.6* 16.6*  ?PLT 284 234 261  ? ? ?Thyroid No results for input(s): TSH, FREET4 in the last 168 hours.  ?BNP ?Recent Labs  ?Lab 09/08/21 ?7867  ?BNP 432.8*  ? ?  ?DDimer  ?Recent Labs  ?Lab 09/04/21 ?1603  ?DDIMER 2.89*   ? ?  ? ?Radiology  ?  ?DG Chest 2 View ? ?Result Date: 09/09/2021 ?CLINICAL DATA:  Shortness of breath EXAM: CHEST - 2 VIEW COMPARISON:  Previous studies including the examination of 09/02/2021 FINDINGS: Transverse diameter of heart is increased. Central pulmonary vessels are less prominent. Slightly increased interstitial markings are seen in the right lung. There is blunting of both lateral CP angles suggesting small effusions, more so on the left side. There is interval decrease in amount of bilateral pleural effusions. Evaluation of lower lung fields for infiltrates is limited by the effusions. There is no pneumothorax. There is previous right shoulder arthroplasty. Degenerative changes are noted in the left shoulder. Old healed fractures are noted in the bilateral ribs. IMPRESSION: Cardiomegaly. Small bilateral pleural effusions with interval decrease. There is decrease in central pulmonary vascular congestion. Residual increased interstitial markings are seen in the right lung suggesting asymmetric pulmonary edema or pneumonia. Evaluation of lower lung fields for infiltrates is limited by the pleural effusions. Electronically Signed   By: Elmer Picker M.D.   On: 09/09/2021 15:00  ? ?DG CHEST PORT 1 VIEW ? ?Result Date: 09/11/2021 ?CLINICAL DATA:  89 year old female with history of increasing shortness of breath this morning. EXAM: PORTABLE CHEST 1 VIEW COMPARISON:  Chest x-ray 09/09/2021. FINDINGS: Worsening aeration in the lungs, most pronounced throughout the right lung, particularly in the medial aspect of the upper lobe and the perihilar aspect of the right mid to lower lung. Persistent opacity at the left lung base which may reflect atelectasis and/or consolidation. Small bilateral pleural effusions. No pneumothorax. No evidence of pulmonary edema. Heart size is mildly enlarged. Upper mediastinal contours are distorted by patient's rotation. Atherosclerotic calcifications in the thoracic aorta.  Status post right shoulder arthroplasty. IMPRESSION: 1. Worsening  multilobar bilateral bronchopneumonia with small bilateral pleural effusions. 2. Aortic atherosclerosis. 3. Mild cardiomegaly. Electronically Signed   By: Vinnie Langton M.D.   On: 09/11/2021 07:23   ? ?Cardiac Studies  ? ?Echocardiogram on 06/23/21  ? 1. Left ventricular ejection fraction, by estimation, is 55 to 60%. The  ?left ventricle has normal function. The left ventricle has no regional  ?wall motion abnormalities. Left ventricular diastolic parameters are  ?consistent with Grade II diastolic  ?dysfunction (pseudonormalization).  ? 2. Right ventricular systolic function is normal. The right ventricular  ?size is normal. There is moderately elevated pulmonary artery systolic  ?pressure. The estimated right ventricular systolic pressure is 01.7 mmHg.  ? 3. Left atrial size was moderately dilated.  ? 4. Right atrial size was moderately dilated.  ? 5. The mitral valve is normal in structure. Mild mitral valve  ?regurgitation. No evidence of mitral stenosis.  ? 6. Tricuspid valve regurgitation is moderate to severe.  ? 7. The aortic valve is tricuspid. There is mild calcification of the  ?aortic valve. Aortic valve regurgitation is not visualized. Aortic valve  ?sclerosis is present, with no evidence of aortic valve stenosis.  ? 8. The inferior vena cava is normal in size with <50% respiratory  ?variability, suggesting right atrial pressure of 8 mmHg.  ? ?Comparison(s): No significant change from prior study. Prior images  ?reviewed side by side.  ? ?Patient Profile  ?   ?88 y.o. female with a hx of chronic atrial fibrillation on Xarelto, chronic diastolic heart failure, aortic aneurysm with chronic descending dissection, aortic valve sclerosis, hypertension, hyperlipidemia, type 2 DM who was seen 09/03/2021 for the evaluation of CHF at the request of Dr. Reesa Chew. ? ?Assessment & Plan  ?  ?Acute on Chronic Diastolic Heart Failure  ?- Patient presented  complaining of new dyspnea on exertion, orthopnea, some PND, pedal edema. Most recent echocardiogram findings above. At  last office visit on 08/18/2021, patient had evidence of volume overload and BNP came back

## 2021-09-11 NOTE — Progress Notes (Signed)
RT NOTE: ? ?Pt requesting to come off BiPAP at this time, pt is alert and oriented, RR is 20 and she has a saturation of 99% on 2L [place]. RN is aware. ?

## 2021-09-11 NOTE — TOC Progression Note (Signed)
Transition of Care (TOC) - Progression Note  ? ? ?Patient Details  ?Name: Tara Bradley ?MRN: [id number] ?Date of Birth: 1923/01/22 ? ?Transition of Care (TOC) CM/SW Contact  ?Purcell Mouton, RN ?Phone Number: ?09/11/2021, 9:39 AM ? ?Clinical Narrative:    ? ?A call was made to insurance co to check on SNF auth. Pt is still in review.  ? ?Expected Discharge Plan: [place] ?Barriers to Discharge: No Barriers Identified ? ?Expected Discharge Plan and Services ?Expected Discharge Plan: [place] ?  ?Discharge Planning Services: CM Consult ?  ?Living arrangements for the past 2 months: [place] ?                ?  ?  ?  ?  ?  ?  ?  ?  ?  ?  ? ? ?Social Determinants of Health (SDOH) Interventions ?  ? ?Readmission Risk Interventions ? ?  05/11/2021  ?  1:11 PM  ?Readmission Risk Prevention Plan  ?Transportation Screening Complete  ?Home Care Screening Complete  ? ? ?

## 2021-09-11 NOTE — Significant Event (Signed)
Patient was noticed to be increasingly short of breath this morning.  On exam at bedside patient appears visibly short of breath but able to complete sentences.  Blood pressure is 130/68 pulse is 60 pulmonary temperature 98.4 respiration about 30/min.  Patient has distended neck veins and bilateral air entry present with crepitations.  Heart S1-S2 heard.  I ordered Lasix 40 mg IV placed patient on BiPAP for now which patient is agreeable for BIPAP. We will get a stat chest x-ray.  Patient's symptoms are likely from fluid overload. ? ?Gean Birchwood ?

## 2021-09-12 DIAGNOSIS — I482 Chronic atrial fibrillation, unspecified: Secondary | ICD-10-CM | POA: Diagnosis not present

## 2021-09-12 DIAGNOSIS — I5033 Acute on chronic diastolic (congestive) heart failure: Secondary | ICD-10-CM | POA: Diagnosis not present

## 2021-09-12 LAB — BASIC METABOLIC PANEL
Anion gap: 6 (ref 5–15)
BUN: 26 mg/dL — ABNORMAL HIGH (ref 8–23)
CO2: 37 mmol/L — ABNORMAL HIGH (ref 22–32)
Calcium: 8.7 mg/dL — ABNORMAL LOW (ref 8.9–10.3)
Chloride: 97 mmol/L — ABNORMAL LOW (ref 98–111)
Creatinine, Ser: 0.74 mg/dL (ref 0.44–1.00)
GFR, Estimated: >60 mL/min (ref >60)
Glucose, Bld: 123 mg/dL — ABNORMAL HIGH (ref 70–99)
Potassium: 3 mmol/L — ABNORMAL LOW (ref 3.5–5.1)
Sodium: 140 mmol/L (ref 135–145)

## 2021-09-12 LAB — GLUCOSE, CAPILLARY
Glucose-Capillary: 87 mg/dL (ref 70–99)
Glucose-Capillary: 86 mg/dL (ref 70–99)
Glucose-Capillary: 133 mg/dL — ABNORMAL HIGH (ref 70–99)
Glucose-Capillary: 294 mg/dL — ABNORMAL HIGH (ref 70–99)

## 2021-09-12 LAB — PROCALCITONIN: Procalcitonin: <0.1 ng/mL

## 2021-09-12 MED ORDER — POTASSIUM CHLORIDE CRYS ER 20 MEQ PO TBCR
40.0000 meq | EXTENDED_RELEASE_TABLET | Freq: Once | ORAL | Status: AC
Start: 2021-09-12 — End: 2021-09-12
  Administered 2021-09-12: 40 meq via ORAL
  Filled 2021-09-12: qty 2

## 2021-09-12 MED ORDER — POTASSIUM CHLORIDE CRYS ER 20 MEQ PO TBCR
40.0000 meq | EXTENDED_RELEASE_TABLET | Freq: Every day | ORAL | Status: DC
  Administered 2021-09-12 – 2021-09-16 (×5): 40 meq via ORAL
  Filled 2021-09-12 (×5): qty 2

## 2021-09-12 NOTE — Progress Notes (Signed)
? ?Progress Note ? ?Patient Name: Tara Bradley ?Date of Encounter: 09/12/2021 ? ?[facility] Cardiologist: [name], MD  ? ?Subjective  ? ?Diuresed about 2.3L negative overnight - was placed on BIPAP yesterday for respiratory distress. Now 5L negative. Hypokalemic with diuresis - now 3.0 today. Legs hurt - ? Too tight ACE wrap - d/w nurse today. ? ?Inpatient Medications  ?  ?Scheduled Meds: ? acetaZOLAMIDE  250 mg Oral BID  ? diltiazem  360 mg Oral Daily  ? feeding supplement  1 Container Oral TID BM  ? ferrous gluconate  324 mg Oral BID WC  ? furosemide  80 mg Intravenous BID  ? gabapentin  100 mg Oral TID  ? insulin aspart  0-9 Units Subcutaneous TID WC  ? insulin glargine-yfgn  10 Units Subcutaneous Daily  ? mouth rinse  15 mL Mouth Rinse BID  ? metoprolol succinate  100 mg Oral Daily  ? potassium chloride  20 mEq Oral Daily  ? Rivaroxaban  15 mg Oral Q supper  ? senna  1 tablet Oral BID  ? tamsulosin  0.4 mg Oral Daily  ? ?Continuous Infusions: ? ?PRN Meds: ?albuterol, dextromethorphan-guaiFENesin, guaiFENesin, hydrALAZINE, ipratropium-albuterol, lip balm, ondansetron (ZOFRAN) IV, senna-docusate, traMADol, traZODone  ? ?Vital Signs  ?  ?Vitals:  ? 09/11/21 1300 09/11/21 2104 09/12/21 0500 09/12/21 0617  ?BP: (!) 120/55 (!) 117/59  (!) 106/59  ?Pulse: (!) 57 60  63  ?Resp: '20 16  20  '$ ?Temp: 97.7 ?F (36.5 ?C) 98.3 ?F (36.8 ?C)  98.6 ?F (37 ?C)  ?TempSrc: Oral Oral  Oral  ?SpO2: 99% 98%  99%  ?Weight:   72.1 kg   ?Height:      ? ? ?Intake/Output Summary (Last 24 hours) at 09/12/2021 0907 ?Last data filed at 09/12/2021 [phone] ?Gross per 24 hour  ?Intake 60 ml  ?Output 2750 ml  ?Net -2690 ml  ? ? ?  09/12/2021  ?  5:00 AM 09/10/2021  ? 10:21 AM 09/10/2021  ?  5:00 AM  ?Last 3 Weights  ?Weight (lbs) 158 lb 15.2 oz 158 lb 4.6 oz 163 lb 2.3 oz  ?Weight (kg) 72.1 kg 71.8 kg 74 kg  ?   ? ?Telemetry  ?  ?Atrial fibrillation with controlled ventricular response and occasional PVCs- Personally Reviewed ? ?ECG  ?   ?N/A ? ?Physical Exam  ? ?GEN: Well nourished, well developed in no acute distress ?HEENT: Normal ?NECK: No JVD; No carotid bruits ?LYMPHATICS: No lymphadenopathy ?CARDIAC:RRR, no murmurs, rubs, gallops ?RESPIRATORY: Decreased breath sounds throughout with scattered crackles ?ABDOMEN: Soft, non-tender, non-distended ?MUSCULOSKELETAL: 1+ pedal edema; No deformity  ?SKIN: Warm and dry ?NEUROLOGIC:  Alert and oriented x 3 ? ?PSYCHIATRIC:  Normal affect  High Sensitivity Troponin:   ?Recent Labs  ?Lab 08/27/21 ?2334 08/28/21 ?7253 09/02/21 ?1318 09/02/21 ?2118  ?TROPONINIHS '7 8 9 9  '$ ?   ?Chemistry ?Recent Labs  ?Lab 09/07/21 ?0350 09/08/21 ?6644 09/09/21 ?0347 09/10/21 ?4259 09/11/21 ?5638 09/12/21 ?7564  ?NA 140 138 136 138 140 140  ?K 3.4* 3.1* 3.8 3.9 3.4* 3.0*  ?CL 89* 93* 94* 95* 95* 97*  ?CO2 45* 40* 35* 38* 38* 37*  ?GLUCOSE 180* 118* 166* 134* 110* 123*  ?BUN 21 22 27* 30* 30* 26*  ?CREATININE 0.68 0.70 0.77 0.87 0.88 0.74  ?CALCIUM 8.7* 8.6* 8.4* 8.8* 8.9 8.7*  ?MG 1.8 1.9 2.1  --   --   --   ?GFRNONAA >60 >60 >60 >60 60* >60  ?ANIONGAP  $'6 5 7 5 7 6  'l$ ?  ?Lipids No results for input(s): CHOL, TRIG, HDL, LABVLDL, LDLCALC, CHOLHDL in the last 168 hours.  ?Hematology ?Recent Labs  ?Lab 09/06/21 ?0345 09/09/21 ?1660 09/11/21 ?0353  ?WBC 6.3 5.7 5.7  ?RBC 3.21* 2.89* 2.99*  ?HGB 9.5* 8.8* 9.0*  ?HCT 31.6* 29.3* 29.7*  ?MCV 98.4 101.4* 99.3  ?MCH 29.6 30.4 30.1  ?MCHC 30.1 30.0 30.3  ?RDW 16.8* 16.6* 16.6*  ?PLT 284 234 261  ? ?Thyroid No results for input(s): TSH, FREET4 in the last 168 hours.  ?BNP ?Recent Labs  ?Lab 09/08/21 ?0709 09/11/21 ?6301  ?BNP 432.8* 442.2*  ?  ?DDimer  ?No results for input(s): DDIMER in the last 168 hours.  ? ?Radiology  ?  ?DG CHEST PORT 1 VIEW ? ?Result Date: 09/11/2021 ?CLINICAL DATA:  88 year old female with history of increasing shortness of breath this morning. EXAM: PORTABLE CHEST 1 VIEW COMPARISON:  Chest x-ray 09/09/2021. FINDINGS: Worsening aeration in the lungs, most pronounced  throughout the right lung, particularly in the medial aspect of the upper lobe and the perihilar aspect of the right mid to lower lung. Persistent opacity at the left lung base which may reflect atelectasis and/or consolidation. Small bilateral pleural effusions. No pneumothorax. No evidence of pulmonary edema. Heart size is mildly enlarged. Upper mediastinal contours are distorted by patient's rotation. Atherosclerotic calcifications in the thoracic aorta. Status post right shoulder arthroplasty. IMPRESSION: 1. Worsening multilobar bilateral bronchopneumonia with small bilateral pleural effusions. 2. Aortic atherosclerosis. 3. Mild cardiomegaly. Electronically Signed   By: Vinnie Langton M.D.   On: 09/11/2021 07:23   ? ?Cardiac Studies  ? ?Echocardiogram on 09/04/21  ? ?Procedure: Limited Echo, Limited Color Doppler and Cardiac Doppler  ? ?Indications:    Chest pain  ?   ?History:        Patient has prior history of Echocardiogram examinations,  ?most  ?                recent 06/23/2021. Arrythmias:Atrial Fibrillation,  ?                Signs/Symptoms:Shortness of Breath; Risk Factors:Diabetes,  ?                Dyslipidemia and Hypertension.  ?   ?Sonographer:    Arlyss Gandy  ?Referring Phys: [id number] Margie Billet  ? ?IMPRESSIONS  ? ? ? 1. Left ventricular ejection fraction, by estimation, is 60 to 65%. The  ?left ventricle has normal function. The left ventricle has no regional  ?wall motion abnormalities. Left ventricular diastolic function could not  ?be evaluated.  ? 2. Right ventricular systolic function is mildly reduced. The right  ?ventricular size is moderately enlarged. There is moderately elevated  ?pulmonary artery systolic pressure. The estimated right ventricular  ?systolic pressure is 35.5 mmHg.  ? 3. Left atrial size was moderately dilated.  ? 4. Right atrial size was severely dilated.  ? 5. Large pleural effusion in the left lateral region.  ? 6. Tricuspid valve regurgitation is moderate.   ? 7. The inferior vena cava is normal in size with greater than 50%  ?respiratory variability, suggesting right atrial pressure of 3 mmHg.  ? ?Patient Profile  ?   ?88 y.o. female with a hx of chronic atrial fibrillation on Xarelto, chronic diastolic heart failure, aortic aneurysm with chronic descending dissection, aortic valve sclerosis, hypertension, hyperlipidemia, type 2 DM who was seen 09/03/2021 for the evaluation of CHF at the request  of Dr. Reesa Chew. ? ?Assessment & Plan  ?  ?Acute on Chronic Diastolic Heart Failure  ?- Patient presented complaining of new dyspnea on exertion, orthopnea, some PND, pedal edema. Most recent echocardiogram findings above. At  last office visit on 08/18/2021, patient had evidence of volume overload and BNP came back elevated in the 400s; therefore, Lasix was increased for 1 week.  Since then, she presented to the ED on 08/27/2021 for progressive shortness of breath. BNP was higher in the 600s and chest x-ray showed cardiomegaly with central pulmonary vascular congestion, small bilateral pleural effusions, and bibasilar infiltrates.  Given IV lasix with improvement.  ?- BNP this admission is elevated but improved to 495.4  ?- Oxygen requirement were improving and she was on 2 L nasal cannula with 100% O2 sats at rest but requires 4 L when ambulating yesterday but had acute respiratory distress this morning with respirations up to 30/min>> chest x-ray showed worsening bronchopneumonia and now on BiPAP after getting 40 mg IV Lasix ?- Lower extremity edema more noticeable on left, patient did have lower extremity dopplers at SNF that were negative for DVT (and patient on Xarelto)  ?- Dry weight suspected to be approx 135 lbs, patient was 169 lbs on presentation and has decreased to 162 pounds on 417.  Weight yesterday was 158 pounds which is down 11 pounds from admission ?-She put out 1.55L yesterday and is net 2.8 L since admission ?-Serum creatinine stable at 0.88, BUN 30 and CO2  38 ?-started on Diamox with improvement in CO2 from 45 to 35 but yesterday bumped back to 38 (26 in mid March) ?-She was on Lasix p.o. twice daily yesterday but due to increased O2 demand with ambulation she was c

## 2021-09-12 NOTE — Progress Notes (Signed)
SATURATION QUALIFICATIONS: (This note is used to comply with regulatory documentation for home oxygen) ? ?Patient Saturations on Room Air at Rest = 82% ? ?Patient Saturations on Room Air while Ambulating = 76% ? ?Patient Saturations on 3 Liters of oxygen while Ambulating = 92% ? ?Please briefly explain why patient needs home oxygen: desaturation with ambulation and at rest ?

## 2021-09-12 NOTE — Progress Notes (Signed)
Pt states that her Unna boot wrap on her, left leg is  to tight. I have encourage the pt to elevate her legs on pillows,pain meds give. Per the pt she is asking to remove the [employer].   First  layer Unna wrap removed. Two bil slits made on the side of the bandage to relieve tension. The pt had immediate  relief. Md Tamala Julian made aware, I will continue to monitor. ?

## 2021-09-12 NOTE — Progress Notes (Signed)
?  Progress Note ?Patient: Tara Bradley DHR:[id number] DOB: December 31, 1922 DOA: 09/02/2021  ?DOS: the patient was seen and examined on 09/12/2021 ? ?Brief hospital course: ?88 y.o. female with medical history significant of past medical history of chronic atrial fibrillation on Xarelto, diastolic CHF ef 46% O0HO, DM 2, HLD, HTN, osteoporosis, thoracic aortic aneurysm, DM 2 comes to the hospital from cardiology office for evaluation of shortness of breath.  Patient was diagnosed CHF exacerbation and started on diuretics.  Cardiology team was consulted.  Patient has been getting aggressively diuresed during the hospitalization. ?Assessment and Plan: ?Acute on chronic diastolic CHF (congestive heart failure) ([place]) ?Aortic valve sclerosis ?Acute respiratory failure with hypoxia ([place]) ?Bilateral pleural effusion ?Chronic atrial fibrillation ([facility]) ?No evidence of bronchopneumonia. ?Complaints of cough and shortness of breath. ?Recent echocardiogram shows preserved EF. ?Cardiology consulted.  Highly appreciate their assistance. ?Continue aggressive IV Lasix ?Started on Diamox due to worsening bicarb. ?Currently appears to be volume overloaded.  Will monitor. ?Still on 4 L of oxygen, at baseline does not use any oxygen. ?Continue incentive spirometry.   ?While the chest x-ray makes comments about bronchopneumonia procalcitonin negative is negative and patient does not exhibit any signs of pneumonia. ?Continue with IV diuresis.  We will follow cardiology recommendation with regards to transitioning. ?May Possibly have pulmonary fibrosis ?  ?Uncontrolled type 2 diabetes mellitus with hyperglycemia, with long-term current use of insulin ([place]) ?Diabetic neuropathy ([place]) ?Hemoglobin A1c 8.6. ?Currently on sliding scale insulin. ?Continue basal bolus regimen with insulin regimen ?Holding metformin. ?  ?Essential hypertension ?Blood pressure relatively stable. ?Currently on Cardizem and Toprol-XL. ?In the setting of bradycardia we will  be holding Toprol-XL for tonight. ?  ?Osteoarthritis ?Recent hip and shoulder fracture. ?Continue PT OT consultation. ?Mobilize as needed. ?  ?Hyperlipidemia ?Not on statin. ?  ?Thoracic aortic aneurysm ([place]) ?Previously seen by CT surgery. ?No intervention or follow-up recommended based on her age. ? ?Subjective: Continues to have shortness of breath.  Had some pain in her legs from [employer]. ? ?Physical Exam: ?Vitals:  ? 09/12/21 0500 09/12/21 0617 09/12/21 0921 09/12/21 1442  ?BP:  (!) 106/59 119/73 (!) 103/57  ?Pulse:  63 78 67  ?Resp:  20  20  ?Temp:  98.6 ?F (37 ?C)  97.6 ?F (36.4 ?C)  ?TempSrc:  Oral  Oral  ?SpO2:  99% 95% 100%  ?Weight: 72.1 kg     ?Height:      ? ?General: Appear in mild distress; no visible Abnormal Neck Mass Or lumps, Conjunctiva normal ?Cardiovascular: S1 and S2 Present, no Murmur, ?Respiratory: increased respiratory effort, Bilateral Air entry present and  bilateral Crackles, no wheezes ?Abdomen: Bowel Sound present, Non tender  ?Extremities: bilateral improving pedal edema ?Neurology: alert and oriented to time, place, and person ?Gait not checked due to patient safety concerns  ? ?Data Reviewed: ?I have Reviewed nursing notes, Vitals, and Lab results since pt's last encounter. Pertinent lab results CBC and BMP ?I have ordered test including BMP and magnesium   ? ?Family Communication: None at bedside ? ?Disposition: ?Status is: Inpatient ?Remains inpatient appropriate because: Requiring IV diuresis. ? ?Author: ?Berle Mull, MD ?09/12/2021 7:30 PM ? ?Please look on [email or web address] to find out who is on call. ?

## 2021-09-13 ENCOUNTER — Inpatient Hospital Stay (HOSPITAL_COMMUNITY): Payer: Medicare Other

## 2021-09-13 DIAGNOSIS — J90 Pleural effusion, not elsewhere classified: Secondary | ICD-10-CM | POA: Diagnosis not present

## 2021-09-13 DIAGNOSIS — J9601 Acute respiratory failure with hypoxia: Secondary | ICD-10-CM | POA: Diagnosis not present

## 2021-09-13 DIAGNOSIS — I712 Thoracic aortic aneurysm, without rupture, unspecified: Secondary | ICD-10-CM

## 2021-09-13 DIAGNOSIS — I482 Chronic atrial fibrillation, unspecified: Secondary | ICD-10-CM | POA: Diagnosis not present

## 2021-09-13 DIAGNOSIS — I5033 Acute on chronic diastolic (congestive) heart failure: Secondary | ICD-10-CM | POA: Diagnosis not present

## 2021-09-13 LAB — BASIC METABOLIC PANEL
Anion gap: 5 (ref 5–15)
BUN: 30 mg/dL — ABNORMAL HIGH (ref 8–23)
CO2: 36 mmol/L — ABNORMAL HIGH (ref 22–32)
Calcium: 8.7 mg/dL — ABNORMAL LOW (ref 8.9–10.3)
Chloride: 99 mmol/L (ref 98–111)
Creatinine, Ser: 0.74 mg/dL (ref 0.44–1.00)
GFR, Estimated: >60 mL/min (ref >60)
Glucose, Bld: 182 mg/dL — ABNORMAL HIGH (ref 70–99)
Potassium: 3.8 mmol/L (ref 3.5–5.1)
Sodium: 140 mmol/L (ref 135–145)

## 2021-09-13 LAB — GLUCOSE, CAPILLARY
Glucose-Capillary: 136 mg/dL — ABNORMAL HIGH (ref 70–99)
Glucose-Capillary: 130 mg/dL — ABNORMAL HIGH (ref 70–99)
Glucose-Capillary: 169 mg/dL — ABNORMAL HIGH (ref 70–99)
Glucose-Capillary: 197 mg/dL — ABNORMAL HIGH (ref 70–99)

## 2021-09-13 LAB — MAGNESIUM: Magnesium: 1.9 mg/dL (ref 1.7–2.4)

## 2021-09-13 NOTE — Progress Notes (Signed)
? ?Progress Note ? ?Patient Name: Tara Bradley ?Date of Encounter: 09/13/2021 ? ?[facility] Cardiologist: [name], MD  ? ?Subjective  ? ?Diuresed another 2L negative overnight - now 7L negative. Xray on 4/21 suggests worsening airspace disease- bronchopneumonia with small effusions, however, not thought to clinically have worsening pneumonia. CO2 appears to be improving with diuresis and acetazolamide. Creatinine stable. ? ?Inpatient Medications  ?  ?Scheduled Meds: ? acetaZOLAMIDE  250 mg Oral BID  ? diltiazem  360 mg Oral Daily  ? feeding supplement  1 Container Oral TID BM  ? ferrous gluconate  324 mg Oral BID WC  ? furosemide  80 mg Intravenous BID  ? gabapentin  100 mg Oral TID  ? insulin aspart  0-9 Units Subcutaneous TID WC  ? insulin glargine-yfgn  10 Units Subcutaneous Daily  ? mouth rinse  15 mL Mouth Rinse BID  ? metoprolol succinate  100 mg Oral Daily  ? potassium chloride  40 mEq Oral Daily  ? Rivaroxaban  15 mg Oral Q supper  ? senna  1 tablet Oral BID  ? tamsulosin  0.4 mg Oral Daily  ? ?Continuous Infusions: ? ?PRN Meds: ?albuterol, dextromethorphan-guaiFENesin, guaiFENesin, hydrALAZINE, ipratropium-albuterol, lip balm, ondansetron (ZOFRAN) IV, senna-docusate, traMADol, traZODone  ? ?Vital Signs  ?  ?Vitals:  ? 09/12/21 2055 09/13/21 0459 09/13/21 0500 09/13/21 0850  ?BP: (!) 101/58 107/65  (!) 108/59  ?Pulse: (!) 59 82  64  ?Resp: 16 17    ?Temp: 97.7 ?F (36.5 ?C) 98 ?F (36.7 ?C)    ?TempSrc: Oral Oral    ?SpO2: 96% 100%  100%  ?Weight:   72.2 kg   ?Height:      ? ? ?Intake/Output Summary (Last 24 hours) at 09/13/2021 0915 ?Last data filed at 09/13/2021 0600 ?Gross per 24 hour  ?Intake --  ?Output 1750 ml  ?Net -1750 ml  ? ? ?  09/13/2021  ?  5:00 AM 09/12/2021  ?  5:00 AM 09/10/2021  ? 10:21 AM  ?Last 3 Weights  ?Weight (lbs) 159 lb 2.8 oz 158 lb 15.2 oz 158 lb 4.6 oz  ?Weight (kg) 72.2 kg 72.1 kg 71.8 kg  ?   ? ?Telemetry  ?  ?Atrial fibrillation with controlled ventricular response and  occasional PVCs- Personally Reviewed ? ?ECG  ?  ?N/A ? ?Physical Exam  ? ?GEN: Well nourished, well developed in no acute distress ?HEENT: Normal ?NECK: No JVD; No carotid bruits ?LYMPHATICS: No lymphadenopathy ?CARDIAC:RRR, no murmurs, rubs, gallops ?RESPIRATORY: Decreased breath sounds throughout with scattered crackles ?ABDOMEN: Soft, non-tender, non-distended ?MUSCULOSKELETAL: trace pedal edema; No deformity  ?SKIN: Warm and dry ?NEUROLOGIC:  Alert and oriented x 3 ?PSYCHIATRIC:  Normal affect   ? ?High Sensitivity Troponin:   ?Recent Labs  ?Lab 08/27/21 ?2334 08/28/21 ?6010 09/02/21 ?1318 09/02/21 ?2118  ?TROPONINIHS '7 8 9 9  '$ ?   ?Chemistry ?Recent Labs  ?Lab 09/08/21 ?9323 09/09/21 ?5573 09/10/21 ?2202 09/11/21 ?5427 09/12/21 ?0623 09/13/21 ?0325  ?NA 138 136   < > 140 140 140  ?K 3.1* 3.8   < > 3.4* 3.0* 3.8  ?CL 93* 94*   < > 95* 97* 99  ?CO2 40* 35*   < > 38* 37* 36*  ?GLUCOSE 118* 166*   < > 110* 123* 182*  ?BUN 22 27*   < > 30* 26* 30*  ?CREATININE 0.70 0.77   < > 0.88 0.74 0.74  ?CALCIUM 8.6* 8.4*   < > 8.9 8.7* 8.7*  ?  MG 1.9 2.1  --   --   --  1.9  ?GFRNONAA >60 >60   < > 60* >60 >60  ?ANIONGAP 5 7   < > '7 6 5  '$ ? < > = values in this interval not displayed.  ?  ?Lipids No results for input(s): CHOL, TRIG, HDL, LABVLDL, LDLCALC, CHOLHDL in the last 168 hours.  ?Hematology ?Recent Labs  ?Lab 09/09/21 ?0416 09/11/21 ?0353  ?WBC 5.7 5.7  ?RBC 2.89* 2.99*  ?HGB 8.8* 9.0*  ?HCT 29.3* 29.7*  ?MCV 101.4* 99.3  ?MCH 30.4 30.1  ?MCHC 30.0 30.3  ?RDW 16.6* 16.6*  ?PLT 234 261  ? ?Thyroid No results for input(s): TSH, FREET4 in the last 168 hours.  ?BNP ?Recent Labs  ?Lab 09/08/21 ?0709 09/11/21 ?9450  ?BNP 432.8* 442.2*  ?  ?DDimer  ?No results for input(s): DDIMER in the last 168 hours.  ? ?Radiology  ?  ?No results found. ? ?Cardiac Studies  ? ?Echocardiogram on 09/04/21  ? ?Procedure: Limited Echo, Limited Color Doppler and Cardiac Doppler  ? ?Indications:    Chest pain  ?   ?History:        Patient has prior  history of Echocardiogram examinations,  ?most  ?                recent 06/23/2021. Arrythmias:Atrial Fibrillation,  ?                Signs/Symptoms:Shortness of Breath; Risk Factors:Diabetes,  ?                Dyslipidemia and Hypertension.  ?   ?Sonographer:    Arlyss Gandy  ?Referring Phys: [id number] Margie Billet  ? ?IMPRESSIONS  ? ? ? 1. Left ventricular ejection fraction, by estimation, is 60 to 65%. The  ?left ventricle has normal function. The left ventricle has no regional  ?wall motion abnormalities. Left ventricular diastolic function could not  ?be evaluated.  ? 2. Right ventricular systolic function is mildly reduced. The right  ?ventricular size is moderately enlarged. There is moderately elevated  ?pulmonary artery systolic pressure. The estimated right ventricular  ?systolic pressure is 03.4 mmHg.  ? 3. Left atrial size was moderately dilated.  ? 4. Right atrial size was severely dilated.  ? 5. Large pleural effusion in the left lateral region.  ? 6. Tricuspid valve regurgitation is moderate.  ? 7. The inferior vena cava is normal in size with greater than 50%  ?respiratory variability, suggesting right atrial pressure of 3 mmHg.  ? ?Patient Profile  ?   ?88 y.o. female with a hx of chronic atrial fibrillation on Xarelto, chronic diastolic heart failure, aortic aneurysm with chronic descending dissection, aortic valve sclerosis, hypertension, hyperlipidemia, type 2 DM who was seen 09/03/2021 for the evaluation of CHF at the request of Dr. Reesa Chew. ? ?Assessment & Plan  ?  ?Acute on Chronic Diastolic Heart Failure  ?- Patient presented complaining of new dyspnea on exertion, orthopnea, some PND, pedal edema. Most recent echocardiogram findings above. At  last office visit on 08/18/2021, patient had evidence of volume overload and BNP came back elevated in the 400s; therefore, Lasix was increased for 1 week.  Since then, she presented to the ED on 08/27/2021 for progressive shortness of breath. BNP was  higher in the 600s and chest x-ray showed cardiomegaly with central pulmonary vascular congestion, small bilateral pleural effusions, and bibasilar infiltrates.  Given IV lasix with improvement.  ?- BNP this admission is  elevated but improved to 495.4  ?- Oxygen requirement were improving and she was on 2 L nasal cannula with 100% O2 sats at rest but requires 4 L when ambulating yesterday but had acute respiratory distress this morning with respirations up to 30/min>> chest x-ray showed worsening bronchopneumonia and now on BiPAP after getting 40 mg IV Lasix ?- Lower extremity edema more noticeable on left, patient did have lower extremity dopplers at SNF that were negative for DVT (and patient on Xarelto)  ?- Dry weight suspected to be approx 135 lbs, patient was 169 lbs on presentation and has decreased to 162 pounds on 417.  Weight now stable at 158 lbs (72.2 kg) ?-Serum creatinine stable  ?-started on Diamox 50 mg twice daily with improvement in CO2 ?-Continue Lasix 80 mg IV twice daily today ?-Follow strict I's and O's, daily weights and renal function closely  ?-Continue Toprol-XL 100 mg daily ?-Will obtain non-contrast chest CT to further evaluate lung parenchyma today. ?  ?Chronic Atrial Fibrillation  ?-HR controlled In lab.81 bpm ?-Digoxin stopped due to pauses and bradycardia ?-Bradycardia and pauses have significantly improved ?-Continue Cardizem CD 360 mg daily, Toprol-XL 100 mg daily  ?-Continue home Xarelto 15 mg daily (dosed for reduced creatinine clearance 40.9 mL/min). Patient has had multiple falls recently. Patient's CHA2DS2-VASc = 7 (CHF, HTN, DM, vascular disease, female, age x2).  ? ?NSVT  ?-None on telemetry ?- Maintain K>4, mag >2 (replete potassium)  ?- Continue Toprol-XL 100 mg  daily ?  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 No further imagining necessary  ?- Monitor BP  ?  ?HTN  ?- BP is well controlled on exam ?- Continue Cardizem CD 360 mg daily and Toprol

## 2021-09-13 NOTE — Progress Notes (Signed)
?  Progress Note ?Patient: Tara Bradley VXB:[id number] DOB: 1922/09/16 DOA: 09/02/2021  ?DOS: the patient was seen and examined on 09/13/2021 ? ?Brief hospital course: ?88 y.o. female with medical history significant of past medical history of chronic atrial fibrillation on Xarelto, diastolic CHF ef 33% A0TM, DM 2, HLD, HTN, osteoporosis, thoracic aortic aneurysm, DM 2 comes to the hospital from cardiology office for evaluation of shortness of breath.  Patient was diagnosed CHF exacerbation and started on diuretics.  Cardiology team was consulted.  Patient has been getting aggressively diuresed during the hospitalization. ? ?Assessment and Plan: ? ?Acute on chronic diastolic CHF  ?Aortic valve sclerosis ?Acute respiratory failure with hypoxia ?Bilateral pleural effusion ?Chronic atrial fibrillation ?No evidence of bronchopneumonia. ?Complaints of cough and shortness of breath. ?Recent echocardiogram shows preserved EF. ?Cardiology consulted.  Highly appreciate their assistance. ?Continue aggressive IV Lasix ?Started on Diamox due to worsening bicarb. ?Currently appears to be volume overloaded.  Will monitor. ?Still on 4 L of oxygen, at baseline does not use any oxygen. ?Continue incentive spirometry.   ?While the chest x-ray makes comments about bronchopneumonia procalcitonin negative is negative and patient does not exhibit any signs of pneumonia. ?Continue with IV diuresis.  We will follow cardiology recommendation with regards to transitioning. ?HRCT shows small to moderate bilateral pleural effusion, atelectasis possibility of bronchiolectasis and tracheobronchial malacia. ?  ?Uncontrolled type 2 diabetes mellitus with hyperglycemia, with long-term current use of insulin ([place]) ?Diabetic neuropathy ([place]) ?Hemoglobin A1c 8.6. ?Currently on sliding scale insulin. ?Continue basal bolus regimen with insulin regimen ?Holding metformin. ?  ?Essential hypertension ?Blood pressure relatively stable. ?Currently on Cardizem  and Toprol-XL. ?In the setting of bradycardia we will be holding Toprol-XL for tonight. ?  ?Osteoarthritis ?Recent hip and shoulder fracture. ?Continue PT OT consultation. ?Mobilize as needed. ?  ?Hyperlipidemia ?Not on statin. ?  ?Thoracic aortic aneurysm ([place]) ?Previously seen by CT surgery. ?No intervention or follow-up recommended based on her age. ? ?Subjective: Breathing improving.  No nausea no vomiting no fever no chills.  No chest pain.  Swelling in the leg improving as well.  Weight down to 68 kg. ? ?Physical Exam: ?Vitals:  ? 09/13/21 0850 09/13/21 0919 09/13/21 1236 09/13/21 1255  ?BP: (!) 108/59  115/71   ?Pulse: 64  71   ?Resp:   (!) 22   ?Temp:   97.9 ?F (36.6 ?C)   ?TempSrc:   Oral   ?SpO2: 100% 93% 100%   ?Weight:    68.6 kg  ?Height:      ? ?General: Appear in mild distress; no visible Abnormal Neck Mass Or lumps, Conjunctiva normal ?Cardiovascular: S1 and S2 Present, no Murmur, ?Respiratory: good respiratory effort, Bilateral Air entry present and  bilateral Crackles, no wheezes ?Abdomen: Bowel Sound present, Non tender ?Extremities: trace Pedal edema ?Neurology: alert and oriented to time, place, and person ?Gait not checked due to patient safety concerns  ? ?Data Reviewed: ?I have Reviewed nursing notes, Vitals, and Lab results since pt's last encounter. Pertinent lab results CBC and BMP ?I have ordered test including BMP and mag ?I have discussed pt's care plan and test results with cardiology.  ? ?Family Communication: None at bedside ? ?Disposition: ?Status is: Inpatient ?Remains inpatient appropriate because: Ongoing requirement for IV diuresis ? ?Author: ?Berle Mull, MD ?09/13/2021 5:08 PM ? ?Please look on [email or web address] to find out who is on call. ?

## 2021-09-14 ENCOUNTER — Ambulatory Visit: Payer: Medicare Other | Admitting: Podiatry

## 2021-09-14 DIAGNOSIS — I5033 Acute on chronic diastolic (congestive) heart failure: Secondary | ICD-10-CM | POA: Diagnosis not present

## 2021-09-14 DIAGNOSIS — I482 Chronic atrial fibrillation, unspecified: Secondary | ICD-10-CM | POA: Diagnosis not present

## 2021-09-14 DIAGNOSIS — I455 Other specified heart block: Secondary | ICD-10-CM | POA: Diagnosis not present

## 2021-09-14 LAB — BASIC METABOLIC PANEL
Anion gap: 5 (ref 5–15)
BUN: 30 mg/dL — ABNORMAL HIGH (ref 8–23)
CO2: 37 mmol/L — ABNORMAL HIGH (ref 22–32)
Calcium: 8.7 mg/dL — ABNORMAL LOW (ref 8.9–10.3)
Chloride: 97 mmol/L — ABNORMAL LOW (ref 98–111)
Creatinine, Ser: 0.71 mg/dL (ref 0.44–1.00)
GFR, Estimated: >60 mL/min (ref >60)
Glucose, Bld: 94 mg/dL (ref 70–99)
Potassium: 3.6 mmol/L (ref 3.5–5.1)
Sodium: 139 mmol/L (ref 135–145)

## 2021-09-14 LAB — GLUCOSE, CAPILLARY
Glucose-Capillary: 100 mg/dL — ABNORMAL HIGH (ref 70–99)
Glucose-Capillary: 133 mg/dL — ABNORMAL HIGH (ref 70–99)
Glucose-Capillary: 161 mg/dL — ABNORMAL HIGH (ref 70–99)
Glucose-Capillary: 112 mg/dL — ABNORMAL HIGH (ref 70–99)
Glucose-Capillary: <10 mg/dL — CL (ref 70–99)

## 2021-09-14 LAB — MAGNESIUM: Magnesium: 2.2 mg/dL (ref 1.7–2.4)

## 2021-09-14 MED ORDER — FUROSEMIDE 40 MG PO TABS
40.0000 mg | ORAL_TABLET | Freq: Two times a day (BID) | ORAL | Status: DC
  Administered 2021-09-14: 40 mg via ORAL
  Filled 2021-09-14: qty 1

## 2021-09-14 MED ORDER — POTASSIUM CHLORIDE CRYS ER 20 MEQ PO TBCR
40.0000 meq | EXTENDED_RELEASE_TABLET | Freq: Once | ORAL | Status: AC
  Administered 2021-09-14: 40 meq via ORAL
  Filled 2021-09-14: qty 2

## 2021-09-14 MED ORDER — FUROSEMIDE 40 MG PO TABS
80.0000 mg | ORAL_TABLET | Freq: Two times a day (BID) | ORAL | Status: DC
  Administered 2021-09-15 – 2021-09-16 (×3): 80 mg via ORAL
  Filled 2021-09-14 (×3): qty 2

## 2021-09-14 NOTE — Discharge Instructions (Signed)

## 2021-09-14 NOTE — Progress Notes (Signed)
Physical Therapy Treatment ?Patient Details ?Name: Tara Bradley ?MRN: [id number] ?DOB: 1922/08/06 ?Today's Date: 09/14/2021 ? ? ?History of Present Illness Tara Bradley is a 88 y.o. female with medical history significant of past medical history of chronic atrial fibrillation on Xarelto, diastolic CHF ef 22% G2RK, DM 2, HLD, HTN, osteoporosis, thoracic aortic aneurysm, DM 2 comes to the hospital from cardiology office for evaluation of shortness of breath.BNP is elevated, chest x-ray suggestive of pulmonary edema/effusion ? ?  ?PT Comments  ? ? 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  Assisted with amb in hallway.  General Gait Details: decreased amb distance due to 3/4 dyspnea.  Trial amb on RA sats decreased to 82%.  Required 2 lts to achieve low 90's.  Very shallow short breaths.General transfer comment: Inreased time and effort but pt able to perform with close Min guard. Cues for technique, hand placement.  slight posterior LOB with stand to sit. Very limited activity tolerance.  Pt will need ST Rehab at SNF.  ? ?SATURATION QUALIFICATIONS: (This note is used to comply with regulatory documentation for home oxygen) ? ?Patient Saturations on Room Air at Rest =94% ? ?Patient Saturations on Room Air while Ambulating 24 feet = 82% ? ?Patient Saturations on 2 Liters of oxygen while Ambulating = 90% ? ?Please briefly explain why patient needs home oxygen:  Pt requires supplemental oxygen to achieve therapeutic levels ?  ?Recommendations for follow up therapy are one component of a multi-disciplinary discharge planning process, led by the attending physician.  Recommendations may be updated based on patient status, additional functional criteria and insurance authorization. ? ?Follow Up Recommendations ? Skilled nursing-short term rehab (<3 hours/day) ?  ?  ?Assistance Recommended at Discharge Frequent or constant  Supervision/Assistance  ?Patient can return home with the following A little help with walking and/or transfers;A little help with bathing/dressing/bathroom;Help with stairs or ramp for entrance;Assist for transportation;Assistance with cooking/housework ?  ?Equipment Recommendations ? None recommended by PT  ?  ?Recommendations for Other Services   ? ? ?  ?Precautions / Restrictions Precautions ?Precautions: Fall ?Precaution Comments: SOB, monitor O2 and HR ?Restrictions ?Weight Bearing Restrictions: No  ?  ? ?Mobility ? Bed Mobility ?  ?  ?  ?  ?  ?  ?  ?General bed mobility comments: OOB in recliner ?  ? ?Transfers ?Overall transfer level: Needs assistance ?Equipment used: Rolling walker (2 wheels) ?Transfers: Sit to/from Stand ?Sit to Stand: Min guard, Min assist ?  ?  ?  ?  ?  ?General transfer comment: Inreased time and effort but pt able to perform with close Min guard. Cues for technique, hand placement.  slight posterior LOB with stand to sit. ?  ? ?Ambulation/Gait ?Ambulation/Gait assistance: Min assist ?Gait Distance (Feet): 24 Feet ?Assistive device: Rolling walker (2 wheels) ?Gait Pattern/deviations: Step-through pattern, Decreased stride length, Trunk flexed ?Gait velocity: decreased ?  ?  ?General Gait Details: decreased amb distance due to 3/4 dyspnea.  Trial amb on RA sats decreased to 82%.  Required 2 lts to achieve low 90's.  Very shallow short breaths. ? ? ?Stairs ?  ?  ?  ?  ?  ? ? ?Wheelchair Mobility ?  ? ?Modified Rankin (Stroke Patients Only) ?  ? ? ?  ?Balance   ?  ?  ?  ?  ?  ?  ?  ?  ?  ?  ?  ?  ?  ?  ?  ?  ?  ?  ?  ? ?  ?  Cognition   ?Behavior During Therapy: [facility] for tasks assessed/performed ?Overall Cognitive Status: Within Functional Limits for tasks assessed ?  ?  ?  ?  ?  ?  ?  ?  ?  ?  ?  ?  ?  ?  ?  ?  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 ?  ?  ? ?  ?Exercises   ? ?  ?General Comments   ?  ?   ? ?Pertinent Vitals/Pain Pain Assessment ?Pain Assessment: Faces ?Faces Pain Scale: Hurts a little bit ?Pain Location: L platar foot "swelling" ?Pain Descriptors / Indicators: Discomfort ?Pain Intervention(s): Monitored during session  ? ? ?Home Living   ?  ?  ?  ?  ?  ?  ?  ?  ?  ?   ?  ?Prior Function    ?  ?  ?   ? ?PT Goals (current goals can now be found in the care plan section) Progress towards PT goals: Progressing toward goals ? ?  ?Frequency ? ? ? Min 2X/week ? ? ? ?  ?PT Plan Current plan remains appropriate  ? ? ?Co-evaluation   ?  ?  ?  ?  ? ?  ?AM-PAC PT "6 Clicks" Mobility   ?Outcome Measure ? Help needed turning from your back to your side while in a flat bed without using bedrails?: A Little ?Help needed moving from lying on your back to sitting on the side of a flat bed without using bedrails?: A Little ?Help needed moving to and from a bed to a chair (including a wheelchair)?: A Little ?Help needed standing up from a chair using your arms (e.g., wheelchair or bedside chair)?: A Little ?Help needed to walk in hospital room?: A Lot ?Help needed climbing 3-5 steps with a railing? : Total ?6 Click Score: 15 ? ?  ?End of Session Equipment Utilized During Treatment: Gait belt;Oxygen ?Activity Tolerance: Patient limited by fatigue ?Patient left: in chair;with call bell/phone within reach ?Nurse Communication: Mobility status ?PT Visit Diagnosis: Muscle weakness (generalized) (M62.81);Difficulty in walking, not elsewhere classified (R26.2) ?  ? ? ?Time: 1544-1600 ?PT Time Calculation (min) (ACUTE ONLY): 16 min ? ?Charges:  $Gait Training: 8-22 mins          ?          ?Rica Koyanagi  PTA ?Acute  Rehabilitation Services ?Pager      [phone] ?Office      [phone] ? ?

## 2021-09-14 NOTE — Progress Notes (Signed)
?Progress Note ?Patient: Tara Bradley TKZ:[id number] DOB: June 10, 1922 DOA: 09/02/2021  ?DOS: the patient was seen and examined on 09/14/2021 ? ?Brief hospital course: ?88 y.o. female with medical history significant of past medical history of chronic atrial fibrillation on Xarelto, diastolic CHF ef 57% D2KG, DM 2, HLD, HTN, osteoporosis, thoracic aortic aneurysm, DM 2 comes to the hospital from cardiology office for evaluation of shortness of breath.  Patient was diagnosed CHF exacerbation and started on diuretics.  Cardiology team was consulted.  Patient has been getting aggressively diuresed during the hospitalization. ? ?Assessment and Plan: ?Acute on chronic diastolic CHF  ?Aortic valve sclerosis ?Acute respiratory failure with hypoxia ?Bilateral pleural effusion ?Chronic atrial fibrillation ?Bradycardia and intermittent sinus pause in sleep ?No evidence of bronchopneumonia. ?Complaints of cough and shortness of breath. ?Recent echocardiogram shows preserved EF. ?Cardiology consulted.  Highly appreciate their assistance. ?Started on Diamox due to worsening bicarb. ?While the chest x-ray makes comments about bronchopneumonia procalcitonin negative is negative and patient does not exhibit any signs of pneumonia. ?HRCT shows small to moderate bilateral pleural effusion, atelectasis possibility of bronchiolectasis and tracheobronchial malacia. ?Oxygenation improving on ambulation from requiring 4 L on exertion to now requiring 2 L on exertion to maintain adequate saturation. ? ?At present transitioning to p.o. Lasix.  Patient received 80 mg IV Lasix and 40 mg p.o. Lasix this morning and therefore will initiate p.o. Lasix from tomorrow to 80 mg twice daily. ?On Toprol-XL and Cardizem for rate control. ?On Xarelto for anticoagulation. ?Bradycardia and pause appears to actually have improved with after stopping the digoxin. ?Monitor. ?  ?Uncontrolled type 2 diabetes mellitus with hyperglycemia, with long-term current  use of insulin ([place]) ?Diabetic neuropathy ([place]) ?Hemoglobin A1c 8.6. ?Currently on sliding scale insulin. ?Continue basal bolus regimen with insulin regimen ?Holding metformin. ?  ?Essential hypertension ?Blood pressure relatively stable. ?Currently on Cardizem and Toprol-XL. ?In the setting of bradycardia we will be holding Toprol-XL for tonight. ?  ?Osteoarthritis ?Recent hip and shoulder fracture. ?Continue PT OT consultation. ?Mobilize as needed. ?  ?Hyperlipidemia ?Not on statin. ?  ?Thoracic aortic aneurysm ([place]) ?Previously seen by CT surgery. ?No intervention or follow-up recommended based on her age. ?  ?Subjective: Continues to have shortness of breath and cough although improving.  No fever no chills. ? ?Physical Exam: ?Vitals:  ? 09/14/21 1000 09/14/21 1324 09/14/21 1500 09/14/21 1700  ?BP:  115/71    ?Pulse:  (!) 52    ?Resp: 18 20 (!) 21 20  ?Temp:  98.2 ?F (36.8 ?C)    ?TempSrc:  Oral    ?SpO2:  97%    ?Weight:      ?Height:      ? ?General: Appear in mild distress; no visible Abnormal Neck Mass Or lumps, Conjunctiva normal ?Cardiovascular: S1 and S2 Present, no Murmur, ?Respiratory: good respiratory effort, Bilateral Air entry present and  bilateral Crackles, no wheezes ?Abdomen: Bowel Sound present, Non tender ?Extremities: no Pedal edema ?Neurology: alert and oriented to time, place, and person ?Gait not checked due to patient safety concerns  ? ?Data Reviewed: ?I have Reviewed nursing notes, Vitals, and Lab results since pt's last encounter. Pertinent lab results CBC and BMP ?I have ordered test including CBC and BMP ?I have reviewed the last note from cardiology,   ? ?Family Communication: None at bedside ? ?Disposition: ?Status is: Inpatient ?Remains inpatient appropriate because: Possibly home tomorrow as long as oxygenation improves and renal function stabilizes. ?Author: ?Berle Mull, MD ?09/14/2021 7:15 PM ? ?  Please look on [email or web address] to find out who is on call. ?

## 2021-09-14 NOTE — Progress Notes (Signed)
? ?Progress Note ? ?Patient Name: Tara Bradley ?Date of Encounter: 09/14/2021 ? ?[facility] Cardiologist: [name], MD  ? ?Subjective  ? ?Diuresing well still with cough and unable to use IS well  ? ?Inpatient Medications  ?  ?Scheduled Meds: ? acetaZOLAMIDE  250 mg Oral BID  ? diltiazem  360 mg Oral Daily  ? feeding supplement  1 Container Oral TID BM  ? ferrous gluconate  324 mg Oral BID WC  ? furosemide  80 mg Intravenous BID  ? gabapentin  100 mg Oral TID  ? insulin aspart  0-9 Units Subcutaneous TID WC  ? insulin glargine-yfgn  10 Units Subcutaneous Daily  ? mouth rinse  15 mL Mouth Rinse BID  ? metoprolol succinate  100 mg Oral Daily  ? potassium chloride  40 mEq Oral Daily  ? Rivaroxaban  15 mg Oral Q supper  ? senna  1 tablet Oral BID  ? tamsulosin  0.4 mg Oral Daily  ? ?Continuous Infusions: ? ?PRN Meds: ?albuterol, dextromethorphan-guaiFENesin, guaiFENesin, hydrALAZINE, ipratropium-albuterol, lip balm, ondansetron (ZOFRAN) IV, senna-docusate, traMADol, traZODone  ? ?Vital Signs  ?  ?Vitals:  ? 09/13/21 1236 09/13/21 1255 09/13/21 2030 09/14/21 0428  ?BP: 115/71  (!) 112/59 112/62  ?Pulse: 71  80 64  ?Resp: (!) '22  20 16  '$ ?Temp: 97.9 ?F (36.6 ?C)  98.1 ?F (36.7 ?C) 98.4 ?F (36.9 ?C)  ?TempSrc: Oral  Oral Oral  ?SpO2: 100%  97% 100%  ?Weight:  68.6 kg  68.8 kg  ?Height:      ? ? ?Intake/Output Summary (Last 24 hours) at 09/14/2021 0905 ?Last data filed at 09/14/2021 0800 ?Gross per 24 hour  ?Intake 60 ml  ?Output 1750 ml  ?Net -1690 ml  ? ? ?  09/14/2021  ?  4:28 AM 09/13/2021  ? 12:55 PM 09/13/2021  ?  5:00 AM  ?Last 3 Weights  ?Weight (lbs) 151 lb 10.8 oz 151 lb 3.2 oz 159 lb 2.8 oz  ?Weight (kg) 68.8 kg 68.584 kg 72.2 kg  ?   ? ?Telemetry  ?  ?Atrial fibrillation with controlled ventricular response and occasional PVCs- Personally Reviewed ? ?ECG  ?  ?N/A ? ?Physical Exam  ? ?Elderly female ?Rhonchi/rales bilateral  ?JVP elevated ?AV sclerosis murmur  ?Legs wrapped with plus one edema ? ?High  Sensitivity Troponin:   ?Recent Labs  ?Lab 08/27/21 ?2334 08/28/21 ?5956 09/02/21 ?1318 09/02/21 ?2118  ?TROPONINIHS '7 8 9 9  '$ ?   ?Chemistry ?Recent Labs  ?Lab 09/09/21 ?0416 09/10/21 ?0356 09/12/21 ?3875 09/13/21 ?6433 09/14/21 ?0304  ?NA 136   < > 140 140 139  ?K 3.8   < > 3.0* 3.8 3.6  ?CL 94*   < > 97* 99 97*  ?CO2 35*   < > 37* 36* 37*  ?GLUCOSE 166*   < > 123* 182* 94  ?BUN 27*   < > 26* 30* 30*  ?CREATININE 0.77   < > 0.74 0.74 0.71  ?CALCIUM 8.4*   < > 8.7* 8.7* 8.7*  ?MG 2.1  --   --  1.9 2.2  ?GFRNONAA >60   < > >60 >60 >60  ?ANIONGAP 7   < > '6 5 5  '$ ? < > = values in this interval not displayed.  ?  ?Lipids No results for input(s): CHOL, TRIG, HDL, LABVLDL, LDLCALC, CHOLHDL in the last 168 hours.  ?Hematology ?Recent Labs  ?Lab 09/09/21 ?0416 09/11/21 ?0353  ?WBC 5.7 5.7  ?RBC 2.89*  2.99*  ?HGB 8.8* 9.0*  ?HCT 29.3* 29.7*  ?MCV 101.4* 99.3  ?MCH 30.4 30.1  ?MCHC 30.0 30.3  ?RDW 16.6* 16.6*  ?PLT 234 261  ? ?Thyroid No results for input(s): TSH, FREET4 in the last 168 hours.  ?BNP ?Recent Labs  ?Lab 09/08/21 ?0709 09/11/21 ?7124  ?BNP 432.8* 442.2*  ?  ?DDimer  ?No results for input(s): DDIMER in the last 168 hours.  ? ?Radiology  ?  ?CT Chest High Resolution ? ?Result Date: 09/13/2021 ?CLINICAL DATA:  Dyspnea on exertion, concern for bronchopneumonia, diastolic CHF EXAM: CT CHEST WITHOUT CONTRAST TECHNIQUE: Multidetector CT imaging of the chest was performed following the standard protocol without intravenous contrast. High resolution imaging of the lungs, as well as inspiratory and expiratory imaging, was performed. RADIATION DOSE REDUCTION: This exam was performed according to the departmental dose-optimization program which includes automated exposure control, adjustment of the mA and/or kV according to patient size and/or use of iterative reconstruction technique. COMPARISON:  None. FINDINGS: Cardiovascular: Aortic atherosclerosis. Mild cardiomegaly. Three-vessel coronary artery calcifications. No  pericardial effusion. Mediastinum/Nodes: No enlarged mediastinal, hilar, or axillary lymph nodes. Thyroid gland, trachea, and esophagus demonstrate no significant findings. Lungs/Pleura: Small to moderate bilateral pleural effusions and associated atelectasis or consolidation, right greater than left. Some evidence of interlobular septal thickening and subpleural bronchiolectasis involving the peripheral anterior right lower lobe (series 4, image 217). Ground-glass airspace opacity of the upper lobes (series 4, image 74). Tracheobronchomalacia on expiratory phase imaging. No significant air trapping. Upper Abdomen: No acute abnormality. Musculoskeletal: No chest wall abnormality. No suspicious osseous lesions identified. Status post right shoulder reverse arthroplasty. High-grade wedge deformity of T8. Dextroscoliosis of the thoracic spine. IMPRESSION: 1. Small to moderate bilateral pleural effusions and associated atelectasis or consolidation, right greater than left. 2. Nonspecific infectious or inflammatory ground-glass airspace opacity of the upper lobes. 3. Some evidence of interlobular septal thickening and subpleural bronchiolectasis involving the peripheral anterior right lower lobe. Please note that evaluation for fibrotic interstitial lung disease is generally very limited in the setting of pleural effusions. 4. Tracheobronchomalacia on expiratory phase imaging. 5. Cardiomegaly and coronary artery disease. Aortic Atherosclerosis (ICD10-I70.0). Electronically Signed   By: Delanna Ahmadi M.D.   On: 09/13/2021 16:25   ? ?Cardiac Studies  ? ?Echocardiogram on 09/04/21  ? ?Procedure: Limited Echo, Limited Color Doppler and Cardiac Doppler  ? ?Indications:    Chest pain  ?   ?History:        Patient has prior history of Echocardiogram examinations,  ?most  ?                recent 06/23/2021. Arrythmias:Atrial Fibrillation,  ?                Signs/Symptoms:Shortness of Breath; Risk Factors:Diabetes,  ?                 Dyslipidemia and Hypertension.  ?   ?Sonographer:    Arlyss Gandy  ?Referring Phys: [id number] Margie Billet  ? ?IMPRESSIONS  ? ? ? 1. Left ventricular ejection fraction, by estimation, is 60 to 65%. The  ?left ventricle has normal function. The left ventricle has no regional  ?wall motion abnormalities. Left ventricular diastolic function could not  ?be evaluated.  ? 2. Right ventricular systolic function is mildly reduced. The right  ?ventricular size is moderately enlarged. There is moderately elevated  ?pulmonary artery systolic pressure. The estimated right ventricular  ?systolic pressure is 38.2 mmHg.  ? 3. Left atrial  size was moderately dilated.  ? 4. Right atrial size was severely dilated.  ? 5. Large pleural effusion in the left lateral region.  ? 6. Tricuspid valve regurgitation is moderate.  ? 7. The inferior vena cava is normal in size with greater than 50%  ?respiratory variability, suggesting right atrial pressure of 3 mmHg.  ? ?Patient Profile  ?   ?88 y.o. female with a hx of chronic atrial fibrillation on Xarelto, chronic diastolic heart failure, aortic aneurysm with chronic descending dissection, aortic valve sclerosis, hypertension, hyperlipidemia, type 2 DM who was seen 09/03/2021 for the evaluation of CHF at the request of Dr. Reesa Chew. ? ?Assessment & Plan  ?  ?Acute on Chronic Diastolic Heart Failure  ?- BNP elevated in the 495  CT with parenchymal lung disease and effusion . Change to PO diuretic  ?- Weight down Minus 1.6 L's  ?-Serum creatinine stable  ?-started on Diamox 50 mg twice daily with improvement in CO2 ?-Change lasix to PO  ?-Follow strict I's and O's, daily weights and renal function closely  ?-Continue Toprol-XL 100 mg daily ? ?Chronic Atrial Fibrillation  ?-HR controlled In lab.81 bpm ?-Digoxin stopped due to pauses and bradycardia ?-Bradycardia and pauses have significantly improved ?-Continue Cardizem CD 360 mg daily, Toprol-XL 100 mg daily  ?-Continue home Xarelto 15 mg  daily (dosed for reduced creatinine clearance 40.9 mL/min). Patient has had multiple falls recently. Patient's CHA2DS2-VASc = 7 (CHF, HTN, DM, vascular disease, female, age x2).  ? ?NSVT  ?-None on telemetry ?-

## 2021-09-14 NOTE — Progress Notes (Signed)
Ortho tech called at [phone] regarding placement of Unna Boots, no answer and voice mail not setup, no response to after hours pager [phone].  ?

## 2021-09-14 NOTE — TOC Progression Note (Signed)
Transition of Care (TOC) - Progression Note  ? ? ?Patient Details  ?Name: Tara Bradley ?MRN: [id number] ?Date of Birth: 27-Nov-1922 ? ?Transition of Care (TOC) CM/SW Contact  ?Leeroy Cha, RN ?Phone Number: ?09/14/2021, 10:26 AM ? ?Clinical Narrative:    ?Chart reviewed for toc needs.  [facility] is from [facility] and will return there when able/. ? ? ?Expected Discharge Plan: [place] ?Barriers to Discharge: No Barriers Identified ? ?Expected Discharge Plan and Services ?Expected Discharge Plan: [place] ?  ?Discharge Planning Services: CM Consult ?  ?Living arrangements for the past 2 months: [place] ?                ?  ?  ?  ?  ?  ?  ?  ?  ?  ?  ? ? ?Social Determinants of Health (SDOH) Interventions ?  ? ?Readmission Risk Interventions ? ?  05/11/2021  ?  1:11 PM  ?Readmission Risk Prevention Plan  ?Transportation Screening Complete  ?Home Care Screening Complete  ? ? ?

## 2021-09-14 NOTE — Progress Notes (Signed)
HOSPITAL MEDICINE OVERNIGHT EVENT NOTE   ? ?Notified by nursing that patient has been exhibiting pauses on telemetry throughout the evening.  Pauses have been up to 2.6 seconds in length. ? ?Chart reviewed, patient has a history of atrial fibrillation and has recently had digoxin held patient is still receiving Cardizem and metoprolol according to last cardiology note. ? ?Patient is hemodynamically stable.  Nursing reports the patient is still arousable with no complaints. ? ?Continuing to monitor on telemetry, no changes in current plan of care. ? ?Vernelle Emerald  MD ?[facility]  ? ? ? ? ? ? ? ? ? ? ? ?

## 2021-09-14 NOTE — Progress Notes (Signed)
Orthopedic Tech Progress Note ?Patient Details:  ?Tara Bradley ?1922/12/20 ?[id number] ? ?Patient ID: Tara Bradley, female   DOB: April 02, 1923, 88 y.o.   MRN: [id number] ? ?Tara Bradley ?09/14/2021, 8:14 PMunna boots have not been  removed. Patient is not ready for new application. ? ?

## 2021-09-14 NOTE — Progress Notes (Signed)
Occupational Therapy Treatment ?Patient Details ?Name: Tara Bradley ?MRN: [id number] ?DOB: 04/13/1923 ?Today's Date: 09/14/2021 ? ? ?History of present illness Tara Bradley is a 88 y.o. female with medical history significant of past medical history of chronic atrial fibrillation on Xarelto, diastolic CHF ef 01% U2VO, DM 2, HLD, HTN, osteoporosis, thoracic aortic aneurysm, DM 2 comes to the hospital from cardiology office for evaluation of shortness of breath.BNP is elevated, chest x-ray suggestive of pulmonary edema/effusion ?  ?OT comments ? Patients session was focused on functional activity tolerance and O2 management during session. Patient was noted to need cues for deep breathing and rest breaks. Patient is eager and motivated to participate in all aspects of session. Patient would continue to benefit from skilled OT services at this time while admitted and after d/c to address noted deficits in order to improve overall safety and independence in ADLs.  ?  ? ?Recommendations for follow up therapy are one component of a multi-disciplinary discharge planning process, led by the attending physician.  Recommendations may be updated based on patient status, additional functional criteria and insurance authorization. ?   ?Follow Up Recommendations ? Skilled nursing-short term rehab (<3 hours/day)  ?  ?Assistance Recommended at Discharge Frequent or constant Supervision/Assistance  ?Patient can return home with the following ? A little help with walking and/or transfers;A lot of help with bathing/dressing/bathroom;Assistance with cooking/housework;Help with stairs or ramp for entrance;Assist for transportation ?  ?Equipment Recommendations ? None recommended by OT  ?  ?Recommendations for Other Services   ? ?  ?Precautions / Restrictions Precautions ?Precautions: Fall ?Precaution Comments: SOB, monitor O2 and HR ?Restrictions ?Weight Bearing Restrictions: No  ? ? ?  ? ?Mobility Bed Mobility ?  ?  ?  ?  ?  ?  ?   ?  ?  ? ?Transfers ?  ?  ?  ?  ?  ?  ?  ?  ?  ?  ?  ?  ?Balance   ?  ?  ?  ?  ?  ?  ?  ?  ?  ?  ?  ?  ?  ?  ?  ?  ?  ?  ?   ? ?ADL either performed or assessed with clinical judgement  ? ?ADL Overall ADL's : Needs assistance/impaired ?  ?  ?  ?  ?  ?  ?  ?  ?  ?  ?  ?  ?  ?  ?  ?  ?  ?  ?  ?General ADL Comments: Patient was educated on reading pulse Ox during session and deep breathing strategies. patient was educated on ECT and needing to self monitor symptoms for rest breaks. patient verbalized understanding but continued to need cues for rest breaks during session. patient is eager and motivated to participate in all sessions. ?  ? ?Extremity/Trunk Assessment   ?  ?  ?  ?  ?  ? ?Vision   ?  ?  ?Perception   ?  ?Praxis   ?  ? ?Cognition Arousal/Alertness: Awake/alert ?Behavior During Therapy: [facility] for tasks assessed/performed ?Overall Cognitive Status: Within Functional Limits for tasks assessed ?  ?  ?  ?  ?  ?  ?  ?  ?  ?  ?  ?  ?  ?  ?  ?  ?  ?  ?  ?   ?Exercises Other Exercises ?Other Exercises: patient was able to participte in 4 sit to stands  with education on deep berathign through actiivty. patient was noted to drop to 85% on 2L/min with increased time (1 min) for deep breathing back up to 93% on 2L/min. patient completed 2 additional sit to stands attempting to complete another rep when lunch arrived with visitor on phone. ? ?  ?Shoulder Instructions   ? ? ?  ?General Comments    ? ? ?Pertinent Vitals/ Pain       Pain Assessment ?Pain Assessment: No/denies pain ? ?Home Living   ?  ?  ?  ?  ?  ?  ?  ?  ?  ?  ?  ?  ?  ?  ?  ?  ?  ?  ? ?  ?Prior Functioning/Environment    ?  ?  ?  ?   ? ?Frequency ? Min 2X/week  ? ? ? ? ?  ?Progress Toward Goals ? ?OT Goals(current goals can now be found in the care plan section) ? Progress towards OT goals: Progressing toward goals ? ?   ?Plan Discharge plan remains appropriate   ? ?Co-evaluation ? ? ?   ?  ?  ?  ?  ? ?  ?AM-PAC OT "6 Clicks" Daily Activity     ?Outcome  Measure ? ? Help from another person eating meals?: None ?Help from another person taking care of personal grooming?: A Little ?Help from another person toileting, which includes using toliet, bedpan, or urinal?: A Little ?Help from another person bathing (including washing, rinsing, drying)?: A Little ?Help from another person to put on and taking off regular upper body clothing?: A Little ?Help from another person to put on and taking off regular lower body clothing?: A Lot ?6 Click Score: 18 ? ?  ?End of Session Equipment Utilized During Treatment: Oxygen;Rolling walker (2 wheels) ? ?OT Visit Diagnosis: Unsteadiness on feet (R26.81);Other abnormalities of gait and mobility (R26.89);Muscle weakness (generalized) (M62.81) ?  ?Activity Tolerance Patient tolerated treatment well ?  ?Patient Left in chair;with call bell/phone within reach;with chair alarm set ?  ?Nurse Communication Mobility status ?  ? ?   ? ?Time: 7673-4193 ?OT Time Calculation (min): 28 min ? ?Charges: OT General Charges ?$OT Visit: 1 Visit ?OT Treatments ?$Therapeutic Activity: 23-37 mins ? ?Read Bonelli OTR/L, MS ?Acute Rehabilitation Department ?Office# [phone] ?Pager# [phone] ? ? ?Tara Bradley ?09/14/2021, 1:01 PM ?

## 2021-09-14 NOTE — Progress Notes (Signed)
RN called to patients room for critical CBG result patient alert and oriented, VSS, administered juice and snack, on call provider J. Olena Heckle NP notified, recheck CBG in 15 minutes. ?

## 2021-09-14 NOTE — Care Management Important Message (Signed)
Important Message ? ?Patient Details IM Letter placed in Patients room. ?Name: Tara Bradley ?MRN: [id number] ?Date of Birth: [birth year] ? ? ?Medicare Important Message Given:  Yes ? ? ? ? ?[name] ?09/14/2021, 12:05 PM ?

## 2021-09-14 NOTE — Progress Notes (Signed)
No bipap needed at this time. ?

## 2021-09-15 DIAGNOSIS — I5033 Acute on chronic diastolic (congestive) heart failure: Secondary | ICD-10-CM | POA: Diagnosis not present

## 2021-09-15 LAB — GLUCOSE, CAPILLARY
Glucose-Capillary: 209 mg/dL — ABNORMAL HIGH (ref 70–99)
Glucose-Capillary: 161 mg/dL — ABNORMAL HIGH (ref 70–99)
Glucose-Capillary: 169 mg/dL — ABNORMAL HIGH (ref 70–99)
Glucose-Capillary: 199 mg/dL — ABNORMAL HIGH (ref 70–99)
Glucose-Capillary: 276 mg/dL — ABNORMAL HIGH (ref 70–99)

## 2021-09-15 LAB — BASIC METABOLIC PANEL
Anion gap: 6 (ref 5–15)
BUN: 33 mg/dL — ABNORMAL HIGH (ref 8–23)
CO2: 33 mmol/L — ABNORMAL HIGH (ref 22–32)
Calcium: 9 mg/dL (ref 8.9–10.3)
Chloride: 98 mmol/L (ref 98–111)
Creatinine, Ser: 0.8 mg/dL (ref 0.44–1.00)
GFR, Estimated: >60 mL/min (ref >60)
Glucose, Bld: 217 mg/dL — ABNORMAL HIGH (ref 70–99)
Potassium: 4.3 mmol/L (ref 3.5–5.1)
Sodium: 137 mmol/L (ref 135–145)

## 2021-09-15 LAB — MAGNESIUM: Magnesium: 2.3 mg/dL (ref 1.7–2.4)

## 2021-09-15 MED ORDER — METOPROLOL SUCCINATE ER 100 MG PO TB24
100.0000 mg | ORAL_TABLET | Freq: Every day | ORAL | 0 refills | Status: DC
Start: 2021-09-16 — End: 2021-10-26

## 2021-09-15 MED ORDER — POTASSIUM CHLORIDE ER 10 MEQ PO TBCR
20.0000 meq | EXTENDED_RELEASE_TABLET | Freq: Every day | ORAL | 0 refills | Status: DC
Start: 2021-09-15 — End: 2022-01-20

## 2021-09-15 MED ORDER — FUROSEMIDE 40 MG PO TABS: ORAL_TABLET | ORAL | 0 refills | Status: DC

## 2021-09-15 NOTE — Progress Notes (Addendum)
? ?Progress Note ? ?Patient Name: Tara Bradley ?Date of Encounter: 09/15/2021 ? ?[facility] Cardiologist: [name], MD  ? ?Subjective  ? ?Patient states she feels weak, needed more oxygen up to 4L while ambulating yesterday, leg swelling is much improved, still urinating well. She denied any chest pain.  ? ?Inpatient Medications  ?  ?Scheduled Meds: ? acetaZOLAMIDE  250 mg Oral BID  ? diltiazem  360 mg Oral Daily  ? feeding supplement  1 Container Oral TID BM  ? ferrous gluconate  324 mg Oral BID WC  ? furosemide  80 mg Oral BID  ? gabapentin  100 mg Oral TID  ? insulin aspart  0-9 Units Subcutaneous TID WC  ? insulin glargine-yfgn  10 Units Subcutaneous Daily  ? mouth rinse  15 mL Mouth Rinse BID  ? metoprolol succinate  100 mg Oral Daily  ? potassium chloride  40 mEq Oral Daily  ? Rivaroxaban  15 mg Oral Q supper  ? senna  1 tablet Oral BID  ? tamsulosin  0.4 mg Oral Daily  ? ?Continuous Infusions: ? ?PRN Meds: ?albuterol, dextromethorphan-guaiFENesin, guaiFENesin, hydrALAZINE, ipratropium-albuterol, lip balm, ondansetron (ZOFRAN) IV, senna-docusate, traMADol, traZODone  ? ?Vital Signs  ?  ?Vitals:  ? 09/14/21 2010 09/14/21 2150 09/14/21 2155 09/15/21 0450  ?BP: (!) 110/52   114/68  ?Pulse: 64   62  ?Resp: 20   20  ?Temp: 98 ?F (36.7 ?C)   97.6 ?F (36.4 ?C)  ?TempSrc: Oral   Oral  ?SpO2: 96% 93% 96% 100%  ?Weight:      ?Height:      ? ? ?Intake/Output Summary (Last 24 hours) at 09/15/2021 0818 ?Last data filed at 09/15/2021 0450 ?Gross per 24 hour  ?Intake 770 ml  ?Output 1500 ml  ?Net -730 ml  ? ? ?  09/14/2021  ?  4:28 AM 09/13/2021  ? 12:55 PM 09/13/2021  ?  5:00 AM  ?Last 3 Weights  ?Weight (lbs) 151 lb 10.8 oz 151 lb 3.2 oz 159 lb 2.8 oz  ?Weight (kg) 68.8 kg 68.584 kg 72.2 kg  ?   ? ?Telemetry  ?  ?Atrial fibrillation with controlled ventricular response and occasional PVCs, short runs of atrial tachycardia rate up to 160s noted on 09/14/21 at 1503- Personally Reviewed ? ?ECG  ?  ?No new tracing  today  ? ?Physical Exam  ? ?Vitals:  ?Vitals:  ? 09/14/21 2155 09/15/21 0450  ?BP:  114/68  ?Pulse:  62  ?Resp:  20  ?Temp:  97.6 ?F (36.4 ?C)  ?SpO2: 96% 100%  ? ?General Appearance: In no apparent distress, laying in bed ?Neck: Supple, trachea midline, no JVDs ?Cardiovascular: Irregularly irregular, no murmur  ?Respiratory: Resting breathing unlabored, lungs sounds with scattered wheezing  ?Gastrointestinal: Bowel sounds positive, abdomen soft ?Extremities: BLE trace edema  ?Musculoskeletal: Generalized decondition and weakness  ?Skin: Intact, warm, dry.  ?Neurologic: Alert, oriented to person, place and time. Fluent speech, no facial droop, no cognitive deficit ?Psychiatric: Normal affect. Mood is appropriate.  ? ? ?High Sensitivity Troponin:   ?Recent Labs  ?Lab 08/27/21 ?2334 08/28/21 ?2440 09/02/21 ?1318 09/02/21 ?2118  ?TROPONINIHS '7 8 9 9  '$ ?   ?Chemistry ?Recent Labs  ?Lab 09/13/21 ?0325 09/14/21 ?0304 09/15/21 ?1027  ?NA 140 139 137  ?K 3.8 3.6 4.3  ?CL 99 97* 98  ?CO2 36* 37* 33*  ?GLUCOSE 182* 94 217*  ?BUN 30* 30* 33*  ?CREATININE 0.74 0.71 0.80  ?CALCIUM 8.7* 8.7* 9.0  ?  MG 1.9 2.2 2.3  ?GFRNONAA >60 >60 >60  ?ANIONGAP '5 5 6  '$ ?  ?Lipids No results for input(s): CHOL, TRIG, HDL, LABVLDL, LDLCALC, CHOLHDL in the last 168 hours.  ?Hematology ?Recent Labs  ?Lab 09/09/21 ?0416 09/11/21 ?0353  ?WBC 5.7 5.7  ?RBC 2.89* 2.99*  ?HGB 8.8* 9.0*  ?HCT 29.3* 29.7*  ?MCV 101.4* 99.3  ?MCH 30.4 30.1  ?MCHC 30.0 30.3  ?RDW 16.6* 16.6*  ?PLT 234 261  ? ?Thyroid No results for input(s): TSH, FREET4 in the last 168 hours.  ?BNP ?Recent Labs  ?Lab 09/11/21 ?2979  ?BNP 442.2*  ?  ?DDimer  ?No results for input(s): DDIMER in the last 168 hours.  ? ?Radiology  ?  ?CT Chest High Resolution ? ?Result Date: 09/13/2021 ?CLINICAL DATA:  Dyspnea on exertion, concern for bronchopneumonia, diastolic CHF EXAM: CT CHEST WITHOUT CONTRAST TECHNIQUE: Multidetector CT imaging of the chest was performed following the standard protocol without  intravenous contrast. High resolution imaging of the lungs, as well as inspiratory and expiratory imaging, was performed. RADIATION DOSE REDUCTION: This exam was performed according to the departmental dose-optimization program which includes automated exposure control, adjustment of the mA and/or kV according to patient size and/or use of iterative reconstruction technique. COMPARISON:  None. FINDINGS: Cardiovascular: Aortic atherosclerosis. Mild cardiomegaly. Three-vessel coronary artery calcifications. No pericardial effusion. Mediastinum/Nodes: No enlarged mediastinal, hilar, or axillary lymph nodes. Thyroid gland, trachea, and esophagus demonstrate no significant findings. Lungs/Pleura: Small to moderate bilateral pleural effusions and associated atelectasis or consolidation, right greater than left. Some evidence of interlobular septal thickening and subpleural bronchiolectasis involving the peripheral anterior right lower lobe (series 4, image 217). Ground-glass airspace opacity of the upper lobes (series 4, image 74). Tracheobronchomalacia on expiratory phase imaging. No significant air trapping. Upper Abdomen: No acute abnormality. Musculoskeletal: No chest wall abnormality. No suspicious osseous lesions identified. Status post right shoulder reverse arthroplasty. High-grade wedge deformity of T8. Dextroscoliosis of the thoracic spine. IMPRESSION: 1. Small to moderate bilateral pleural effusions and associated atelectasis or consolidation, right greater than left. 2. Nonspecific infectious or inflammatory ground-glass airspace opacity of the upper lobes. 3. Some evidence of interlobular septal thickening and subpleural bronchiolectasis involving the peripheral anterior right lower lobe. Please note that evaluation for fibrotic interstitial lung disease is generally very limited in the setting of pleural effusions. 4. Tracheobronchomalacia on expiratory phase imaging. 5. Cardiomegaly and coronary artery  disease. Aortic Atherosclerosis (ICD10-I70.0). Electronically Signed   By: Delanna Ahmadi M.D.   On: 09/13/2021 16:25   ? ?Cardiac Studies  ? ?Echocardiogram on 09/04/21  ? ? 1. Left ventricular ejection fraction, by estimation, is 60 to 65%. The  ?left ventricle has normal function. The left ventricle has no regional  ?wall motion abnormalities. Left ventricular diastolic function could not  ?be evaluated.  ? 2. Right ventricular systolic function is mildly reduced. The right  ?ventricular size is moderately enlarged. There is moderately elevated  ?pulmonary artery systolic pressure. The estimated right ventricular  ?systolic pressure is 89.2 mmHg.  ? 3. Left atrial size was moderately dilated.  ? 4. Right atrial size was severely dilated.  ? 5. Large pleural effusion in the left lateral region.  ? 6. Tricuspid valve regurgitation is moderate.  ? 7. The inferior vena cava is normal in size with greater than 50%  ?respiratory variability, suggesting right atrial pressure of 3 mmHg.  ? ? ?CT chest 09/13/21: ? ?1. Small to moderate bilateral pleural effusions and associated ?atelectasis or consolidation, right  greater than left. ?2. Nonspecific infectious or inflammatory ground-glass airspace ?opacity of the upper lobes. ?3. Some evidence of interlobular septal thickening and subpleural ?bronchiolectasis involving the peripheral anterior right lower lobe. ?Please note that evaluation for fibrotic interstitial lung disease ?is generally very limited in the setting of pleural effusions. ?4. Tracheobronchomalacia on expiratory phase imaging. ?5. Cardiomegaly and coronary artery disease. ? ?Patient Profile  ?   ?88 y.o. female with a hx of chronic atrial fibrillation on Xarelto, chronic diastolic heart failure, aortic aneurysm with chronic descending dissection, aortic valve sclerosis, hypertension, hyperlipidemia, type 2 DM, cardiology is following since 09/03/2021 for the evaluation of CHF. ? ?Assessment & Plan  ?  ?Acute  hypoxic respiratory failure ?- multifactorial from CHF, parenchymal lung disease, pleural effusion, decondition ?- CHF overall improved, need PT, pulmonary workup if desired ? ?Acute on Chronic Diastolic Hear

## 2021-09-15 NOTE — Progress Notes (Signed)
Occupational Therapy Treatment ?Patient Details ?Name: Tara Bradley ?MRN: [id number] ?DOB: 1922/11/23 ?Today's Date: 09/15/2021 ? ? ?History of present illness MARIELYS TRINIDAD is a 88 y.o. female with medical history significant of past medical history of chronic atrial fibrillation on Xarelto, diastolic CHF ef 78% G9FA, DM 2, HLD, HTN, osteoporosis, thoracic aortic aneurysm, DM 2 comes to the hospital from cardiology office for evaluation of shortness of breath.BNP is elevated, chest x-ray suggestive of pulmonary edema/effusion ?  ?OT comments ? Patient was noted to be able to maintain O2 saturation on 3L/min with functional activities- toileting tasks at RW level. Patients HR was noted to reach 125 bpm with peak activity on this date. Patient would continue to benefit from skilled OT services at this time while admitted and after d/c to address noted deficits in order to improve overall safety and independence in ADLs.  ?  ? ?Recommendations for follow up therapy are one component of a multi-disciplinary discharge planning process, led by the attending physician.  Recommendations may be updated based on patient status, additional functional criteria and insurance authorization. ?   ?Follow Up Recommendations ? Skilled nursing-short term rehab (<3 hours/day)  ?  ?Assistance Recommended at Discharge Frequent or constant Supervision/Assistance  ?Patient can return home with the following ? A little help with walking and/or transfers;A lot of help with bathing/dressing/bathroom;Assistance with cooking/housework;Help with stairs or ramp for entrance;Assist for transportation ?  ?Equipment Recommendations ?    ?  ?Recommendations for Other Services   ? ?  ?Precautions / Restrictions Precautions ?Precautions: Fall ?Precaution Comments: SOB, monitor O2 and HR ?Restrictions ?Weight Bearing Restrictions: No  ? ? ?  ? ?Mobility Bed Mobility ?Overal bed mobility: Needs Assistance ?Bed Mobility: Supine to Sit ?  ?  ?Supine to  sit: Min assist, HOB elevated ?  ?  ?General bed mobility comments: with increased time to turn self to edge of bed and square up ?  ? ?Transfers ?  ?  ?  ?  ?  ?  ?  ?  ?  ?  ?  ?  ?Balance Overall balance assessment: Needs assistance ?Sitting-balance support: No upper extremity supported, Feet supported ?Sitting balance-Leahy Scale: Good ?  ?  ?Standing balance support: During functional activity ?Standing balance-Leahy Scale: Poor ?  ?  ?  ?  ?  ?  ?  ?  ?  ?  ?  ?  ?   ? ?ADL either performed or assessed with clinical judgement  ? ?ADL Overall ADL's : Needs assistance/impaired ?  ?  ?  ?  ?  ?  ?  ?  ?  ?  ?  ?  ?Toilet Transfer: Financial planner (2 wheels);Grab bars ?Toilet Transfer Details (indicate cue type and reason): min guard to ambulate to bathroom with RW on 3 L [place]  needed assist to manage O2 cord. ?Toileting- Water quality scientist and Hygiene: Sit to/from stand;Moderate assistance ?Toileting - Clothing Manipulation Details (indicate cue type and reason): due to fatigue ?  ?  ?Functional mobility during ADLs: Min guard;Rolling walker (2 wheels) ?  ?  ? ?Extremity/Trunk Assessment   ?  ?  ?  ?  ?  ? ?Vision   ?  ?  ?Perception   ?  ?Praxis   ?  ? ?Cognition Arousal/Alertness: Awake/alert ?Behavior During Therapy: [facility] for tasks assessed/performed ?Overall Cognitive Status: Within Functional Limits for tasks assessed ?  ?  ?  ?  ?  ?  ?  ?  ?  ?  ?  ?  ?  ?  ?  ?  ?  General Comments: plesant but noted to be tired today. patient reported havng a bad night last night ?  ?  ?   ?Exercises   ? ?  ?Shoulder Instructions   ? ? ?  ?General Comments    ? ? ?Pertinent Vitals/ Pain       Pain Assessment ?Pain Assessment: No/denies pain ? ?Home Living   ?  ?  ?  ?  ?  ?  ?  ?  ?  ?  ?  ?  ?  ?  ?  ?  ?  ?  ? ?  ?Prior Functioning/Environment    ?  ?  ?  ?   ? ?Frequency ? Min 2X/week  ? ? ? ? ?  ?Progress Toward Goals ? ?OT Goals(current goals can now be found in the care plan section) ?  Progress towards OT goals: Progressing toward goals ? ?   ?Plan Discharge plan remains appropriate   ? ?Co-evaluation ? ? ?   ?  ?  ?  ?  ? ?  ?AM-PAC OT "6 Clicks" Daily Activity     ?Outcome Measure ? ? Help from another person eating meals?: None ?Help from another person taking care of personal grooming?: A Little ?Help from another person toileting, which includes using toliet, bedpan, or urinal?: A Little ?Help from another person bathing (including washing, rinsing, drying)?: A Little ?Help from another person to put on and taking off regular upper body clothing?: A Little ?Help from another person to put on and taking off regular lower body clothing?: A Lot ?6 Click Score: 18 ? ?  ?End of Session Equipment Utilized During Treatment: Oxygen;Rolling walker (2 wheels) ? ?OT Visit Diagnosis: Unsteadiness on feet (R26.81);Other abnormalities of gait and mobility (R26.89);Muscle weakness (generalized) (M62.81) ?  ?Activity Tolerance Patient tolerated treatment well ?  ?Patient Left in chair;with call bell/phone within reach;with chair alarm set ?  ?Nurse Communication Mobility status ?  ? ?   ? ?Time: 1518-3437 ?OT Time Calculation (min): 25 min ? ?Charges: OT General Charges ?$OT Visit: 1 Visit ?OT Treatments ?$Self Care/Home Management : 23-37 mins ? ?Finch Costanzo OTR/L, MS ?Acute Rehabilitation Department ?Office# [phone] ?Pager# [phone] ? ? ?Feliz Beam Franchelle Foskett ?09/15/2021, 11:32 AM ?
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
## 2021-09-15 NOTE — Discharge Summary (Signed)
?Physician Discharge Summary ?  ?Patient: Tara Bradley MRN: [id number] DOB: [birth year]  ?Admit date:     09/02/2021  ?Discharge date: 09/15/21  ?Discharge Physician: Berle Mull  ?PCP: [name], MD ? ?Recommendations at discharge: ?Follow-up with PCP in 1 week. ?Follow-up with cardiology in 1 week. ? ?Discharge Diagnoses: ?Principal Problem: ?  Acute on chronic diastolic CHF (congestive heart failure) ([place]) ?Active Problems: ?  Aortic valve sclerosis ?  Acute respiratory failure with hypoxia ([place]) ?  Bilateral pleural effusion ?  Chronic atrial fibrillation (HCC) ?  Uncontrolled type 2 diabetes mellitus with hyperglycemia, with long-term current use of insulin ([place]) ?  Diabetic neuropathy ([place]) ?  Essential hypertension ?  Osteoarthritis ?  Hyperlipidemia ?  Thoracic aortic aneurysm ([place]) ?  S/P shoulder replacement, right ?  Hip fracture ([place]) ?  Sinus pause ? ? ?Hospital Course: ?88 y.o. female with medical history significant of past medical history of chronic atrial fibrillation on Xarelto, diastolic CHF ef 58% N2DP, DM 2, HLD, HTN, osteoporosis, thoracic aortic aneurysm, DM 2 comes to the hospital from cardiology office for evaluation of shortness of breath.  Patient was diagnosed CHF exacerbation and started on diuretics.  Cardiology team was consulted.  Patient has been getting aggressively diuresed during the hospitalization. ?Filed Weights  ? 09/13/21 0500 09/13/21 1255 09/14/21 0428  ?Weight: 72.2 kg 68.6 kg 68.8 kg  ?  ?Assessment and Plan: ?Acute on chronic diastolic CHF  ?Aortic valve sclerosis ?Acute respiratory failure with hypoxia ?Bilateral pleural effusion ?No evidence of bronchopneumonia. ?Complaints of cough and shortness of breath. ?Recent echocardiogram shows preserved EF. ?Outpatient weight 08/02/2021 135 pounds - 61.2 kg. ?On admission weight 167 pounds - 75.8 kg. ?Cardiology consulted.  Highly appreciate their assistance.  Started on IV Lasix 80 mg twice daily. ?Then was transitioned  to p.o. Lasix along with Diamox due to contraction alkalosis by cardiology ?As the patient continues to have shortness of breath as well as severe weight gain and edema patient was started back on IV Lasix 80 mg twice daily. ?Patient diuresed well, -10 L in the hospital. ?Renal function remained stable with that. ?At present volume status appears to be stable. ?Current weight is 68.8 kg - 151.68 pounds. ?Recommend that the patient continues to take oral Lasix 80 mg twice daily for next 4 days on a scheduled basis. ?After that the patient will transition to 40 mg twice daily Lasix with an additional 40 mg dose whenever she has a weight gain of 3 pounds in 1 day or 5 pounds in 1 week. ?Discussed with cardiology, they do not have any specific recommendation with regards to Diamox.  We will stop this medication. ?Recheck BMP in 1 week. ?Increase potassium supplementation 20 mg on discharge. ?Follow-up with cardiology in 1 week.  Scheduled for 5/1. ? ?Chronic atrial fibrillation ?Bradycardia and intermittent sinus pause in sleep ?On Toprol-XL and Cardizem for rate control. ?On Xarelto for anticoagulation. ?Bradycardia and pause appears to actually have improved with after stopping the digoxin. ?Monitor. ? ?Acute respiratory failure with hypoxia on 4 LPM now. ?Concern for chronic pulmonary lung disease. ?No evidence of bronchopneumonia on examination as well as laboratory finding. ?Clinical examination as well as CT scan concerning for possible chronic pulmonary fibrosis like picture. ?Although when the patient is in volume overload/heart failure CT chest is not that useful. ?Recommend patient to have follow-up CT scan in 4 to 6 weeks. ?Regardless it will not change management for the patient and patient will require continuous  oxygen therapy going forward. ? ?Uncontrolled type 2 diabetes mellitus with hyperglycemia, with long-term current use of insulin ([place]) ?Diabetic neuropathy ([place]) ?Hemoglobin A1c 8.6. ?We will  continue home regimen. ?  ?Essential hypertension ?Blood pressure relatively stable. ?Currently on Cardizem and Toprol-XL. ?  ?Osteoarthritis ?Recent hip and shoulder fracture. ?Continue PT OT consultation. ?Mobilize as needed. ?  ?Hyperlipidemia ?Not on statin. ?  ?Thoracic aortic aneurysm ([place]) ?Previously seen by CT surgery. ?No intervention or follow-up recommended based on her age. ? ?Consultants: Cardiology  ?Procedures performed:  ?Echocardiogram  ? ?DISCHARGE MEDICATION: ?Allergies as of 09/15/2021   ? ?   Reactions  ? Ambien [zolpidem] Other (See Comments)  ? hallucinations  ? Codeine Other (See Comments)  ? headache  ? Codeine Sulfate Other (See Comments)  ? REACTION: unspecified  ? Dulaglutide Diarrhea  ? Latex Hives  ? Morphine Sulfate Nausea And Vomiting  ? Mirtazapine Palpitations  ? ?  ? ?  ?Medication List  ?  ? ?STOP taking these medications   ? ?acetaminophen 500 MG tablet ?Commonly known as: TYLENOL ?  ?Digoxin 62.5 MCG Tabs ?  ?doxycycline 100 MG capsule ?Commonly known as: VIBRAMYCIN ?  ?HumaLOG Mix 50/50 KwikPen (50-50) 100 UNIT/ML Kwikpen ?Generic drug: Insulin Lispro Prot & Lispro ?  ?methocarbamol 500 MG tablet ?Commonly known as: ROBAXIN ?  ? ?  ? ?TAKE these medications   ? ?BD Pen Needle Nano U/F 32G X 4 MM Misc ?Generic drug: Insulin Pen Needle ?Inject into the skin daily. ?  ?Calcium Carbonate-Vitamin D 600-400 MG-UNIT tablet ?Take 1 tablet by mouth daily. ?  ?Dextromethorphan-guaiFENesin 5-100 MG/5ML Liqd ?Take 10 mLs by mouth 3 (three) times daily. ?  ?diltiazem 360 MG 24 hr capsule ?Commonly known as: CARDIZEM CD ?Take 1 capsule (360 mg total) by mouth daily. ?  ?eszopiclone 2 MG Tabs tablet ?Commonly known as: LUNESTA ?Take 2 mg by mouth at bedtime. Take immediately before bedtime ?  ?ferrous gluconate 324 MG tablet ?Commonly known as: FERGON ?Take 324 mg by mouth 2 (two) times daily with a meal. ?  ?furosemide 40 MG tablet ?Commonly known as: LASIX ?Take 2 tablets (80 mg total) by  mouth 2 (two) times daily for 4 days, THEN 1 tablet (40 mg total) 2 (two) times daily. Take extra 40 mg tablet for weight gain of 3 lbs in 1 day or 5 lbs in 1 week. ?Start taking on: September 15, 2021 ?What changed: See the new instructions. ?  ?gabapentin 100 MG capsule ?Commonly known as: NEURONTIN ?Take 100 mg by mouth 3 (three) times daily. ?  ?HumaLOG 100 UNIT/ML injection ?Generic drug: insulin lispro ?Inject 0-12 Units into the skin daily. Sliding scale : If blood sugar less than 70, call NP ?70-200 = 0 units ?201-250 = 2 units ?251-300 = 4 units ?301-350 = 6 units ?351-400 = 8 units ?401-450 = 10 units ?451-600 = 12 units ?If greater than 450 give 12 units. Recheck in 2 hours. If >350 or <100 notify provider. ?  ?insulin glargine 100 UNIT/ML Solostar Pen ?Commonly known as: LANTUS ?Inject 8 Units into the skin daily. ?  ?ipratropium-albuterol 0.5-2.5 (3) MG/3ML Soln ?Commonly known as: DUONEB ?Take 3 mLs by nebulization 2 (two) times daily. ?  ?magnesium oxide 400 MG tablet ?Commonly known as: MAG-OX ?Take 400 mg by mouth at bedtime. ?  ?metFORMIN 500 MG (MOD) 24 hr tablet ?Commonly known as: [place] ?Take 1,000 mg by mouth every evening. ?  ?metoprolol succinate 100 MG 24  hr tablet ?Commonly known as: TOPROL-XL ?Take 1 tablet (100 mg total) by mouth daily. Take with or immediately following a meal. ?Start taking on: September 16, 2021 ?What changed:  ?medication strength ?how much to take ?when to take this ?reasons to take this ?additional instructions ?Another medication with the same name was removed. Continue taking this medication, and follow the directions you see here. ?  ?polyethylene glycol 17 g packet ?Commonly known as: MIRALAX / GLYCOLAX ?Take 17 g by mouth daily. ?  ?potassium chloride 10 MEQ tablet ?Commonly known as: KLOR-CON ?Take 2 tablets (20 mEq total) by mouth daily. ?What changed: how much to take ?  ?senna 8.6 MG Tabs tablet ?Commonly known as: SENOKOT ?Take 1 tablet (8.6 mg total) by mouth  2 (two) times daily. ?  ?tamsulosin 0.4 MG Caps capsule ?Commonly known as: FLOMAX ?Take 1 capsule (0.4 mg total) by mouth daily. ?  ?traZODone 50 MG tablet ?Commonly known as: DESYREL ?Take 50 mg by mouth

## 2021-09-16 DIAGNOSIS — Z20822 Contact with and (suspected) exposure to covid-19: Secondary | ICD-10-CM | POA: Diagnosis not present

## 2021-09-16 DIAGNOSIS — I509 Heart failure, unspecified: Secondary | ICD-10-CM | POA: Diagnosis not present

## 2021-09-16 DIAGNOSIS — R498 Other voice and resonance disorders: Secondary | ICD-10-CM | POA: Diagnosis not present

## 2021-09-16 DIAGNOSIS — J9610 Chronic respiratory failure, unspecified whether with hypoxia or hypercapnia: Secondary | ICD-10-CM | POA: Diagnosis not present

## 2021-09-16 DIAGNOSIS — R278 Other lack of coordination: Secondary | ICD-10-CM | POA: Diagnosis not present

## 2021-09-16 DIAGNOSIS — E1159 Type 2 diabetes mellitus with other circulatory complications: Secondary | ICD-10-CM | POA: Diagnosis not present

## 2021-09-16 DIAGNOSIS — M6259 Muscle wasting and atrophy, not elsewhere classified, multiple sites: Secondary | ICD-10-CM | POA: Diagnosis not present

## 2021-09-16 DIAGNOSIS — S72142D Displaced intertrochanteric fracture of left femur, subsequent encounter for closed fracture with routine healing: Secondary | ICD-10-CM | POA: Diagnosis not present

## 2021-09-16 DIAGNOSIS — R0602 Shortness of breath: Secondary | ICD-10-CM | POA: Diagnosis not present

## 2021-09-16 DIAGNOSIS — I152 Hypertension secondary to endocrine disorders: Secondary | ICD-10-CM | POA: Diagnosis not present

## 2021-09-16 DIAGNOSIS — I499 Cardiac arrhythmia, unspecified: Secondary | ICD-10-CM | POA: Diagnosis not present

## 2021-09-16 DIAGNOSIS — Z7984 Long term (current) use of oral hypoglycemic drugs: Secondary | ICD-10-CM | POA: Diagnosis not present

## 2021-09-16 DIAGNOSIS — E1165 Type 2 diabetes mellitus with hyperglycemia: Secondary | ICD-10-CM

## 2021-09-16 DIAGNOSIS — R2689 Other abnormalities of gait and mobility: Secondary | ICD-10-CM | POA: Diagnosis not present

## 2021-09-16 DIAGNOSIS — M1990 Unspecified osteoarthritis, unspecified site: Secondary | ICD-10-CM | POA: Diagnosis not present

## 2021-09-16 DIAGNOSIS — R531 Weakness: Secondary | ICD-10-CM | POA: Diagnosis not present

## 2021-09-16 DIAGNOSIS — I482 Chronic atrial fibrillation, unspecified: Secondary | ICD-10-CM | POA: Diagnosis not present

## 2021-09-16 DIAGNOSIS — R339 Retention of urine, unspecified: Secondary | ICD-10-CM | POA: Diagnosis not present

## 2021-09-16 DIAGNOSIS — I5032 Chronic diastolic (congestive) heart failure: Secondary | ICD-10-CM | POA: Diagnosis not present

## 2021-09-16 DIAGNOSIS — I10 Essential (primary) hypertension: Secondary | ICD-10-CM | POA: Diagnosis not present

## 2021-09-16 DIAGNOSIS — R601 Generalized edema: Secondary | ICD-10-CM | POA: Diagnosis not present

## 2021-09-16 DIAGNOSIS — I714 Abdominal aortic aneurysm, without rupture, unspecified: Secondary | ICD-10-CM | POA: Diagnosis not present

## 2021-09-16 DIAGNOSIS — J9611 Chronic respiratory failure with hypoxia: Secondary | ICD-10-CM | POA: Diagnosis not present

## 2021-09-16 DIAGNOSIS — Z794 Long term (current) use of insulin: Secondary | ICD-10-CM

## 2021-09-16 DIAGNOSIS — R062 Wheezing: Secondary | ICD-10-CM | POA: Diagnosis not present

## 2021-09-16 DIAGNOSIS — R262 Difficulty in walking, not elsewhere classified: Secondary | ICD-10-CM | POA: Diagnosis not present

## 2021-09-16 DIAGNOSIS — G479 Sleep disorder, unspecified: Secondary | ICD-10-CM | POA: Diagnosis not present

## 2021-09-16 DIAGNOSIS — R2681 Unsteadiness on feet: Secondary | ICD-10-CM | POA: Diagnosis not present

## 2021-09-16 DIAGNOSIS — S2231XA Fracture of one rib, right side, initial encounter for closed fracture: Secondary | ICD-10-CM | POA: Diagnosis not present

## 2021-09-16 DIAGNOSIS — I5033 Acute on chronic diastolic (congestive) heart failure: Secondary | ICD-10-CM | POA: Diagnosis not present

## 2021-09-16 DIAGNOSIS — M6281 Muscle weakness (generalized): Secondary | ICD-10-CM | POA: Diagnosis not present

## 2021-09-16 DIAGNOSIS — D649 Anemia, unspecified: Secondary | ICD-10-CM | POA: Diagnosis not present

## 2021-09-16 DIAGNOSIS — R000 Tachycardia, unspecified: Secondary | ICD-10-CM | POA: Diagnosis not present

## 2021-09-16 DIAGNOSIS — S72002A Fracture of unspecified part of neck of left femur, initial encounter for closed fracture: Secondary | ICD-10-CM | POA: Diagnosis not present

## 2021-09-16 DIAGNOSIS — Z91040 Latex allergy status: Secondary | ICD-10-CM | POA: Diagnosis not present

## 2021-09-16 DIAGNOSIS — R609 Edema, unspecified: Secondary | ICD-10-CM | POA: Diagnosis not present

## 2021-09-16 DIAGNOSIS — I5042 Chronic combined systolic (congestive) and diastolic (congestive) heart failure: Secondary | ICD-10-CM | POA: Diagnosis not present

## 2021-09-16 DIAGNOSIS — R059 Cough, unspecified: Secondary | ICD-10-CM | POA: Diagnosis not present

## 2021-09-16 DIAGNOSIS — J9600 Acute respiratory failure, unspecified whether with hypoxia or hypercapnia: Secondary | ICD-10-CM | POA: Diagnosis not present

## 2021-09-16 DIAGNOSIS — J9601 Acute respiratory failure with hypoxia: Secondary | ICD-10-CM | POA: Diagnosis not present

## 2021-09-16 DIAGNOSIS — I119 Hypertensive heart disease without heart failure: Secondary | ICD-10-CM | POA: Diagnosis not present

## 2021-09-16 DIAGNOSIS — I5030 Unspecified diastolic (congestive) heart failure: Secondary | ICD-10-CM | POA: Diagnosis not present

## 2021-09-16 DIAGNOSIS — R791 Abnormal coagulation profile: Secondary | ICD-10-CM | POA: Diagnosis not present

## 2021-09-16 DIAGNOSIS — E785 Hyperlipidemia, unspecified: Secondary | ICD-10-CM | POA: Diagnosis not present

## 2021-09-16 DIAGNOSIS — R0689 Other abnormalities of breathing: Secondary | ICD-10-CM | POA: Diagnosis not present

## 2021-09-16 DIAGNOSIS — I110 Hypertensive heart disease with heart failure: Secondary | ICD-10-CM | POA: Diagnosis not present

## 2021-09-16 DIAGNOSIS — J90 Pleural effusion, not elsewhere classified: Secondary | ICD-10-CM | POA: Diagnosis not present

## 2021-09-16 DIAGNOSIS — I5043 Acute on chronic combined systolic (congestive) and diastolic (congestive) heart failure: Secondary | ICD-10-CM | POA: Diagnosis not present

## 2021-09-16 DIAGNOSIS — Z7401 Bed confinement status: Secondary | ICD-10-CM | POA: Diagnosis not present

## 2021-09-16 DIAGNOSIS — J9811 Atelectasis: Secondary | ICD-10-CM | POA: Diagnosis not present

## 2021-09-16 DIAGNOSIS — I4891 Unspecified atrial fibrillation: Secondary | ICD-10-CM | POA: Diagnosis not present

## 2021-09-16 DIAGNOSIS — R0682 Tachypnea, not elsewhere classified: Secondary | ICD-10-CM | POA: Diagnosis not present

## 2021-09-16 DIAGNOSIS — Z760 Encounter for issue of repeat prescription: Secondary | ICD-10-CM | POA: Diagnosis not present

## 2021-09-16 DIAGNOSIS — Z7901 Long term (current) use of anticoagulants: Secondary | ICD-10-CM | POA: Diagnosis not present

## 2021-09-16 DIAGNOSIS — E119 Type 2 diabetes mellitus without complications: Secondary | ICD-10-CM | POA: Diagnosis not present

## 2021-09-16 DIAGNOSIS — Z79899 Other long term (current) drug therapy: Secondary | ICD-10-CM | POA: Diagnosis not present

## 2021-09-16 DIAGNOSIS — E118 Type 2 diabetes mellitus with unspecified complications: Secondary | ICD-10-CM | POA: Diagnosis not present

## 2021-09-16 DIAGNOSIS — I71012 Dissection of descending thoracic aorta: Secondary | ICD-10-CM | POA: Diagnosis not present

## 2021-09-16 DIAGNOSIS — Z723 Lack of physical exercise: Secondary | ICD-10-CM | POA: Diagnosis not present

## 2021-09-16 DIAGNOSIS — J9690 Respiratory failure, unspecified, unspecified whether with hypoxia or hypercapnia: Secondary | ICD-10-CM | POA: Diagnosis not present

## 2021-09-16 LAB — BASIC METABOLIC PANEL
Anion gap: 6 (ref 5–15)
BUN: 32 mg/dL — ABNORMAL HIGH (ref 8–23)
CO2: 33 mmol/L — ABNORMAL HIGH (ref 22–32)
Calcium: 8.8 mg/dL — ABNORMAL LOW (ref 8.9–10.3)
Chloride: 100 mmol/L (ref 98–111)
Creatinine, Ser: 0.81 mg/dL (ref 0.44–1.00)
GFR, Estimated: >60 mL/min (ref >60)
Glucose, Bld: 139 mg/dL — ABNORMAL HIGH (ref 70–99)
Potassium: 3.5 mmol/L (ref 3.5–5.1)
Sodium: 139 mmol/L (ref 135–145)

## 2021-09-16 LAB — GLUCOSE, CAPILLARY
Glucose-Capillary: 155 mg/dL — ABNORMAL HIGH (ref 70–99)
Glucose-Capillary: 141 mg/dL — ABNORMAL HIGH (ref 70–99)
Glucose-Capillary: 171 mg/dL — ABNORMAL HIGH (ref 70–99)

## 2021-09-16 NOTE — TOC Progression Note (Addendum)
Transition of Care (TOC) - Progression Note  ? ? ?Patient Details  ?Name: Tara Bradley ?MRN: [id number] ?Date of Birth: 1922/09/02 ? ?Transition of Care (TOC) CM/SW Contact  ?Leeroy Cha, RN ?Phone Number: ?09/16/2021, 9:10 AM ? ?Clinical Narrative:    ?[facility] auth'd to go to [facility] today until 2359.  Md and floor rn notified. ?Tct=[facility].  Patient will go to room701.  Dc summary placed and send in the hub.  Ptar called at 1123 ? ?Expected Discharge Plan: [place] ?Barriers to Discharge: No Barriers Identified ? ?Expected Discharge Plan and Services ?Expected Discharge Plan: [place] ?  ?Discharge Planning Services: CM Consult ?  ?Living arrangements for the past 2 months: [place] ?Expected Discharge Date: 09/15/21               ?  ?  ?  ?  ?  ?  ?  ?  ?  ?  ? ? ?Social Determinants of Health (SDOH) Interventions ?  ? ?Readmission Risk Interventions ? ?  05/11/2021  ?  1:11 PM  ?Readmission Risk Prevention Plan  ?Transportation Screening Complete  ?Home Care Screening Complete  ? ? ?

## 2021-09-16 NOTE — Progress Notes (Signed)
?  Progress Note ? ? ?Patient: Tara Bradley IZT:[id number] DOB: [birth year] DOA: 09/02/2021     13 ?DOS: the patient was seen and examined on 09/16/2021 ?  ?Brief hospital course: ?89yow presented with shortness of breath.  Treated for CHF exacerbation with aggressive diuresis and followed by cardiology.  Condition gradually improved and now stable for discharge to SNF.  See Dr. Serita Grit discharge summary for full details. ? ?Assessment and Plan: ?Acute on chronic diastolic CHF ?Acute respiratory failure with hypoxia ?Bilateral pleural effusion ?Chronic atrial fibrillation ?-- No lower extremity edema, appears stable at this time.  Plan for rehab.  Continue diuretic as outlined by Dr. Posey Pronto.  Toprol-XL and Cardizem for rate control.  Xarelto for anticoagulation.  Digoxin was stopped.  She will follow-up with cardiology next week. ?--Pulmonary abnormalities on CT concerning for possible chronic pulmonary fibrosis.  Outpatient CT follow-up 4 to 6 weeks recommended, depending on results consider referral to pulmonology.  Wean off oxygen as tolerated. ? ?Uncontrolled type 2 diabetes mellitus with hyperglycemia, with long-term current use of insulin ([place]) ?Diabetic neuropathy ([place]) ?--Stable.  Continue present regimen. ? ?  ? ?Subjective:  ?Feels ok ?Breathing ok ?No pain ?No n/v ? ?Physical Exam: ?Vitals:  ? 09/15/21 0450 09/15/21 1104 09/15/21 2015 09/16/21 0412  ?BP: 114/68 131/86 (!) 106/59 111/65  ?Pulse: 62 99 (!) 102 67  ?Resp: '20 18 20 18  '$ ?Temp: 97.6 ?F (36.4 ?C) (!) 97.4 ?F (36.3 ?C) 98.4 ?F (36.9 ?C) 97.8 ?F (36.6 ?C)  ?TempSrc: Oral Oral Oral Oral  ?SpO2: 100% 97% 97% 96%  ?Weight:      ?Height:      ? ?Physical Exam ?Vitals reviewed.  ?Constitutional:   ?   General: She is not in acute distress. ?   Appearance: She is not ill-appearing or toxic-appearing.  ?Cardiovascular:  ?   Rate and Rhythm: Normal rate and regular rhythm.  ?   Heart sounds: No murmur heard. ?Pulmonary:  ?   Effort: Pulmonary effort is  normal. No respiratory distress.  ?   Breath sounds: No wheezing, rhonchi or rales.  ?Neurological:  ?   Mental Status: She is alert.  ?Psychiatric:     ?   Mood and Affect: Mood normal.     ?   Behavior: Behavior normal.  ? ? ?Data Reviewed: ? ?BMP noted ? ?Family Communication: none present or requested ? ?Disposition: ?Status is: Inpatient ?To SNF to day ? Planned Discharge Destination: Skilled nursing facility ? ? ? ?Time spent: 35 minutes ? ?Author: ?Murray Hodgkins, MD ?09/16/2021 11:04 AM ? ?For on call review [email or web address].  ?

## 2021-09-16 NOTE — Progress Notes (Signed)
Patient discharging back to [facility].  AVS printed and placed in packet.  IV removed - WNL.  Patient and friend Sunday Spillers aware and agreeable of DC.  Report called to Cecilie Lowers at facility, no questions at this time.  Patient waiting on PTAR for transport in NAD. ?

## 2021-09-17 DIAGNOSIS — E785 Hyperlipidemia, unspecified: Secondary | ICD-10-CM | POA: Diagnosis not present

## 2021-09-17 DIAGNOSIS — I509 Heart failure, unspecified: Secondary | ICD-10-CM | POA: Diagnosis not present

## 2021-09-17 DIAGNOSIS — R2681 Unsteadiness on feet: Secondary | ICD-10-CM | POA: Diagnosis not present

## 2021-09-17 DIAGNOSIS — M1990 Unspecified osteoarthritis, unspecified site: Secondary | ICD-10-CM | POA: Diagnosis not present

## 2021-09-17 DIAGNOSIS — J9601 Acute respiratory failure with hypoxia: Secondary | ICD-10-CM | POA: Diagnosis not present

## 2021-09-17 DIAGNOSIS — J9610 Chronic respiratory failure, unspecified whether with hypoxia or hypercapnia: Secondary | ICD-10-CM | POA: Diagnosis not present

## 2021-09-17 DIAGNOSIS — R339 Retention of urine, unspecified: Secondary | ICD-10-CM | POA: Diagnosis not present

## 2021-09-17 DIAGNOSIS — G479 Sleep disorder, unspecified: Secondary | ICD-10-CM | POA: Diagnosis not present

## 2021-09-17 DIAGNOSIS — R601 Generalized edema: Secondary | ICD-10-CM | POA: Diagnosis not present

## 2021-09-17 DIAGNOSIS — I152 Hypertension secondary to endocrine disorders: Secondary | ICD-10-CM | POA: Diagnosis not present

## 2021-09-17 DIAGNOSIS — S72142D Displaced intertrochanteric fracture of left femur, subsequent encounter for closed fracture with routine healing: Secondary | ICD-10-CM | POA: Diagnosis not present

## 2021-09-17 DIAGNOSIS — J9600 Acute respiratory failure, unspecified whether with hypoxia or hypercapnia: Secondary | ICD-10-CM | POA: Diagnosis not present

## 2021-09-17 DIAGNOSIS — I714 Abdominal aortic aneurysm, without rupture, unspecified: Secondary | ICD-10-CM | POA: Diagnosis not present

## 2021-09-17 DIAGNOSIS — I4891 Unspecified atrial fibrillation: Secondary | ICD-10-CM | POA: Diagnosis not present

## 2021-09-17 DIAGNOSIS — Z794 Long term (current) use of insulin: Secondary | ICD-10-CM | POA: Diagnosis not present

## 2021-09-17 DIAGNOSIS — I5033 Acute on chronic diastolic (congestive) heart failure: Secondary | ICD-10-CM | POA: Diagnosis not present

## 2021-09-17 DIAGNOSIS — E1159 Type 2 diabetes mellitus with other circulatory complications: Secondary | ICD-10-CM | POA: Diagnosis not present

## 2021-09-17 DIAGNOSIS — D649 Anemia, unspecified: Secondary | ICD-10-CM | POA: Diagnosis not present

## 2021-09-18 DIAGNOSIS — Z760 Encounter for issue of repeat prescription: Secondary | ICD-10-CM | POA: Diagnosis not present

## 2021-09-18 DIAGNOSIS — R059 Cough, unspecified: Secondary | ICD-10-CM | POA: Diagnosis not present

## 2021-09-18 DIAGNOSIS — I4891 Unspecified atrial fibrillation: Secondary | ICD-10-CM | POA: Diagnosis not present

## 2021-09-18 DIAGNOSIS — J9600 Acute respiratory failure, unspecified whether with hypoxia or hypercapnia: Secondary | ICD-10-CM | POA: Diagnosis not present

## 2021-09-18 DIAGNOSIS — I5030 Unspecified diastolic (congestive) heart failure: Secondary | ICD-10-CM | POA: Diagnosis not present

## 2021-09-20 NOTE — Progress Notes (Signed)
?Cardiology Office Note:   ? ?Date:  09/24/2021  ? ?ID:  Tara Bradley, DOB 06-Feb-1923, MRN [id number] ? ?PCP:  [name], MD  ?Cardiologist:  [name], MD  ?Electrophysiologist:  None  ? ?Referring MD: [name], MD  ? ?Chief Complaint: hospital follow-up of CHF ? ?History of Present Illness:   ? ?Tara Bradley is a 88 y.o. female with a history of a history of chronic atrial fibrillation Xarelto, chronic diastolic CHF, aortic aneurysm with chronic descending dissection, aortic valve sclerosis, hypertension, hyperlipidemia, type 2 diabetes mellitus, and recent left hip fracture who is followed by Dr. Percival Spanish and presents today for hospital follow-up of CHF. ?  ?Patient has had a couple of hospitalizations over the last few months. She was admitted in 04/2021 with a right humerus fracture after a fall and underwent reverse shoulder arthroplasty. Hospitalization was complicated by acute on chronic diastolic CHF and uncontrolled atrial fibrillation. She was readmitted from 06/22/2021 to 06/26/2021 acute on chronic diastolic CHF and possible viral pneumonia. Echo showed LVEF 55-60% with normal wall motion and grade II diastolic dysfunction.  RV normal. Also showed mild MR, moderate to severe TR, and moderately elevated PASP of 52.1 mmHg. She was diuresed. Rates were again difficult to control during admission and Toprol-XL was increased (home Cardizem and Digoxin were continued). ?  ?She was last seen by Dr. Percival Spanish on 07/17/2021 for hospital follow-up at which time she was doing relatively well and denied any new shortness of breath or any chest discomfort. Rates were controlled. ?  ?She was recently admitted from 08/02/2021 to 08/08/2021 for a left hip fracture after a mechanical fall at home. She underwent intramedullary nailing on 3/12. Postoperatively, she became hypotensive and anemic secondary to acute blood loss anemia. Hemoglobin dropped from 12.3 to as low as 7.1 and she required 1 unit of  PRBCs. She also developed acute urinary retention requiring Foley placement. She has chronic atrial fibrillation and heart rates were uncontrolled for most of admission. When she became hypotensive, patient's home Cardizem and Toprol were reduced. Cardiology was ultimately consulted and patient was placed back on home dose of Cardizem and Toprol and was also restarted on Digoxin 0.0625 (this had fallen off her medication list so was not started on admission but patient confirmed she was still taking this at home).  ?  ?Patient was seen by me for follow-up on 08/18/2021 for follow-up at which time she was recovering very slowly and had significant lower extremity edema especially of her left leg. Dopplers at SNF were reportedly negative. Symptoms were concerning for CHF. BNP came back elevated at 426 and her Lasix was increased for 1 week. She was seen by me again for follow-up on 09/02/2021 at which time she continued to have significant lower extremity and also reported significant dyspnea on exertion with minimal activity. Weight was up 30 lbs from office visit in 06/2021. She was advised to go to the ED for admission for CHF and IV diuresis.   ? ?She was admitted from 09/02/2021 to 08/23/2021 for acute on chronic CHF. Repeat limited Echo showed LVEF of 60-65% with normal wall motion, moderately enlarged RV with mildly reduced systolic function, biatrial enlargement, moderate TR, and left pleural effusion. She was diuresed with IV Lasix and was also started on Diamox due to contraction alkalosis with improvement in CO2. O2 was unable to be completely weaned and she was still requiring 4L of O2 at discharge. There was concern that she also has chronic  pulmonary lung disease. Chest x-ray showed bronchopneumonia and Chest CT showed small to moderate bilateral pleural effusions as well as evidence of interlobular septal thickening and subpleural bronchiectasis concerning for possible chronic pulmonary fibrosis but difficult  to say given this was done in setting of acute CHF. She was diuresed about 10 L during admission and discharge weight was 151 lbs. She was discharged on PO Lasix '80mg'$  twice daily for 4 days with instructions to then decrease to '40mg'$  twice daily with an additional '40mg'$  as needed for weight gain. Digoxin was stopped during admisison due to bradycardia and pauses. Repeat chest CT was recommended in 4-6 weeks. She was discharged back to SNF. ? ?Patient presents today for follow-up. She is doing much better compared to when I last saw her. She is still having dyspnea on exertion and is till requiring 3L of O2 but she feels like her breathing has improved. She is now able to work with PT at [facility] which she was not able to do before. She also continues to have lower extremity edema but it has significant improved.  dyspne on exertion but improved. Able to work with PT now. She still has lower extremity edema but much improved. She denies any significant orthopnea or PND. No chest pian, palpitations, lightheadedness/dizziness, or syncope. She weighs 146 lbs today down from 169 lbs at last visit. ?  ?Past Medical History:  ?Diagnosis Date  ? Aortic valve sclerosis   ? Echo, 2008  ? Arthritis   ? Bradycardia   ? October, 2012  ? Cancer [facility])   ? Chronic atrial fibrillation (HCC)   ? Chronic diastolic CHF (congestive heart failure) ([place])   ? Chronic insomnia   ? Closed fracture of unspecified part of femur 2005  ? Diabetes mellitus, type 2 ([employer])   ? Diverticulitis   ? Diverticulosis   ? Hyperlipidemia   ? Hypertension   ? Long term (current) use of anticoagulants   ? Osteopenia   ? Osteoporosis   ? femur fracture 2005, pelvic fracture 2006  ? Personal history of malignant neoplasm of breast   ? Pneumonia   ? Syncope   ? Thoracic aortic aneurysm ([place]) 02/2017  ? chronic descending aorta dissection  ? Unspecified closed fracture of pelvis 2006  ? ? ?Past Surgical History:  ?Procedure Laterality Date  ? CARDIOVERSION N/A 08/09/2012   ? Procedure: CARDIOVERSION;  Surgeon: Deboraha Sprang, MD;  Location: [place];  Service: Cardiovascular;  Laterality: N/A;  ? EYE SURGERY    ? FEMUR SURGERY  2005  ? ORIF  ? INTRAMEDULLARY (IM) NAIL INTERTROCHANTERIC Left 08/02/2021  ? Procedure: INTRAMEDULLARY (IM) NAIL INTERTROCHANTRIC;  Surgeon: Willaim Sheng, MD;  Location: [place];  Service: Orthopedics;  Laterality: Left;  ? IR ANGIOGRAM PELVIS SELECTIVE OR SUPRASELECTIVE  03/11/2017  ? IR ANGIOGRAM SELECTIVE EACH ADDITIONAL VESSEL  03/11/2017  ? IR EMBO ART  VEN HEMORR LYMPH EXTRAV  INC GUIDE ROADMAPPING  03/11/2017  ? IR FLUORO GUIDE CV LINE RIGHT  03/11/2017  ? IR US GUIDE VASC ACCESS RIGHT  03/11/2017  ? IR US GUIDE VASC ACCESS RIGHT  03/11/2017  ? MASTECTOMY  1995  ? left  ? MINOR HARDWARE REMOVAL Left 09/01/2020  ? Procedure: REMOVAL OF HARDWARE LEFT FOREARM;  Surgeon: Milly Jakob, MD;  Location: [place];  Service: Orthopedics;  Laterality: Left;  ? ORIF ULNAR FRACTURE Left 10/19/2019  ? Procedure: OPEN REDUCTION INTERNAL FIXATION (ORIF)  LEFT DISTAL RADIUS  AND ULNA FRACTURE;  Surgeon: Milly Jakob, MD;  Location: [place];  Service: Orthopedics;  Laterality: Left;  ? REVERSE SHOULDER ARTHROPLASTY Right 05/04/2021  ? Procedure: REVERSE SHOULDER ARTHROPLASTY;  Surgeon: Netta Cedars, MD;  Location: [place];  Service: Orthopedics;  Laterality: Right;  ? SHOULDER SURGERY    ? left  ? [place], 2003  ? TONSILLECTOMY    ? TOTAL ABDOMINAL HYSTERECTOMY W/ BILATERAL SALPINGOOPHORECTOMY  1995  ? WRIST SURGERY    ?  x 2   ? ? ?Current Medications: ?Current Meds  ?Medication Sig  ? BD PEN NEEDLE NANO U/F 32G X 4 MM MISC Inject into the skin daily.  ? Calcium Carbonate-Vitamin D 600-400 MG-UNIT tablet Take 1 tablet by mouth daily.  ? Cholecalciferol (VITAMIN D) 125 MCG (5000 UT) CAPS Take 5,000 Units by mouth daily.  ? Dextromethorphan-guaiFENesin 5-100 MG/5ML LIQD Take 10 mLs by mouth 3 (three) times daily.  ? diltiazem  (CARDIZEM CD) 360 MG 24 hr capsule Take 1 capsule (360 mg total) by mouth daily.  ? eszopiclone (LUNESTA) 2 MG TABS tablet Take 2 mg by mouth at bedtime. Take immediately before bedtime  ? ferrous gluconat

## 2021-09-22 DIAGNOSIS — J9601 Acute respiratory failure with hypoxia: Secondary | ICD-10-CM | POA: Diagnosis not present

## 2021-09-22 DIAGNOSIS — I509 Heart failure, unspecified: Secondary | ICD-10-CM | POA: Diagnosis not present

## 2021-09-22 DIAGNOSIS — S72142D Displaced intertrochanteric fracture of left femur, subsequent encounter for closed fracture with routine healing: Secondary | ICD-10-CM | POA: Diagnosis not present

## 2021-09-22 DIAGNOSIS — I4891 Unspecified atrial fibrillation: Secondary | ICD-10-CM | POA: Diagnosis not present

## 2021-09-22 DIAGNOSIS — R339 Retention of urine, unspecified: Secondary | ICD-10-CM | POA: Diagnosis not present

## 2021-09-22 DIAGNOSIS — R601 Generalized edema: Secondary | ICD-10-CM | POA: Diagnosis not present

## 2021-09-22 DIAGNOSIS — R2681 Unsteadiness on feet: Secondary | ICD-10-CM | POA: Diagnosis not present

## 2021-09-23 ENCOUNTER — Encounter: Payer: Self-pay | Admitting: Student

## 2021-09-23 ENCOUNTER — Ambulatory Visit: Payer: Medicare Other | Admitting: Student

## 2021-09-23 VITALS — BP 100/58 | HR 69 | Ht 65.5 in | Wt 146.0 lb

## 2021-09-23 DIAGNOSIS — I482 Chronic atrial fibrillation, unspecified: Secondary | ICD-10-CM

## 2021-09-23 DIAGNOSIS — Z794 Long term (current) use of insulin: Secondary | ICD-10-CM | POA: Diagnosis not present

## 2021-09-23 DIAGNOSIS — R601 Generalized edema: Secondary | ICD-10-CM | POA: Diagnosis not present

## 2021-09-23 DIAGNOSIS — I71012 Dissection of descending thoracic aorta: Secondary | ICD-10-CM

## 2021-09-23 DIAGNOSIS — J9611 Chronic respiratory failure with hypoxia: Secondary | ICD-10-CM | POA: Diagnosis not present

## 2021-09-23 DIAGNOSIS — J9601 Acute respiratory failure with hypoxia: Secondary | ICD-10-CM | POA: Diagnosis not present

## 2021-09-23 DIAGNOSIS — I10 Essential (primary) hypertension: Secondary | ICD-10-CM | POA: Diagnosis not present

## 2021-09-23 DIAGNOSIS — I5032 Chronic diastolic (congestive) heart failure: Secondary | ICD-10-CM

## 2021-09-23 DIAGNOSIS — E118 Type 2 diabetes mellitus with unspecified complications: Secondary | ICD-10-CM | POA: Diagnosis not present

## 2021-09-23 DIAGNOSIS — I4891 Unspecified atrial fibrillation: Secondary | ICD-10-CM | POA: Diagnosis not present

## 2021-09-23 DIAGNOSIS — R339 Retention of urine, unspecified: Secondary | ICD-10-CM | POA: Diagnosis not present

## 2021-09-23 DIAGNOSIS — S72142D Displaced intertrochanteric fracture of left femur, subsequent encounter for closed fracture with routine healing: Secondary | ICD-10-CM | POA: Diagnosis not present

## 2021-09-23 DIAGNOSIS — I509 Heart failure, unspecified: Secondary | ICD-10-CM | POA: Diagnosis not present

## 2021-09-23 DIAGNOSIS — E785 Hyperlipidemia, unspecified: Secondary | ICD-10-CM | POA: Diagnosis not present

## 2021-09-23 DIAGNOSIS — R2681 Unsteadiness on feet: Secondary | ICD-10-CM | POA: Diagnosis not present

## 2021-09-23 NOTE — Patient Instructions (Signed)
Medication Instructions:  ?Stop taking digoxin ?*If you need a refill on your cardiac medications before your next appointment, please call your pharmacy* ? ? ?Lab Work: ?BMET today ?If you have labs (blood work) drawn today and your tests are completely normal, you will receive your results only by: ?MyChart Message (if you have MyChart) OR ?A paper copy in the mail ?If you have any lab test that is abnormal or we need to change your treatment, we will call you to review the results. ? ?Testing/Procedures: ?[facility] at [facility] ? ?Follow-Up: ?At [facility], you and your health needs are our priority.  As part of our continuing mission to provide you with exceptional heart care, we have created designated Provider Care Teams.  These Care Teams include your primary Cardiologist (physician) and Advanced Practice Providers (APPs -  Physician Assistants and Nurse Practitioners) who all work together to provide you with the care you need, when you need it. ? ?We recommend signing up for the patient portal called "MyChart".  Sign up information is provided on this After Visit Summary.  MyChart is used to connect with patients for Virtual Visits (Telemedicine).  Patients are able to view lab/test results, encounter notes, upcoming appointments, etc.  Non-urgent messages can be sent to your provider as well.   ?To learn more about what you can do with MyChart, go to [email or web address].   ? ?Your next appointment:   ?3 month(s) ? ?The format for your next appointment:   ?In Person ? ?Provider:   ?[name], MD  ? ? ? ? ?

## 2021-09-24 ENCOUNTER — Other Ambulatory Visit: Payer: Self-pay | Admitting: *Deleted

## 2021-09-24 ENCOUNTER — Encounter: Payer: Self-pay | Admitting: Student

## 2021-09-24 DIAGNOSIS — R2681 Unsteadiness on feet: Secondary | ICD-10-CM | POA: Diagnosis not present

## 2021-09-24 DIAGNOSIS — I509 Heart failure, unspecified: Secondary | ICD-10-CM | POA: Diagnosis not present

## 2021-09-24 DIAGNOSIS — S72142D Displaced intertrochanteric fracture of left femur, subsequent encounter for closed fracture with routine healing: Secondary | ICD-10-CM | POA: Diagnosis not present

## 2021-09-24 DIAGNOSIS — R601 Generalized edema: Secondary | ICD-10-CM | POA: Diagnosis not present

## 2021-09-24 DIAGNOSIS — J9601 Acute respiratory failure with hypoxia: Secondary | ICD-10-CM | POA: Diagnosis not present

## 2021-09-24 DIAGNOSIS — I4891 Unspecified atrial fibrillation: Secondary | ICD-10-CM | POA: Diagnosis not present

## 2021-09-24 DIAGNOSIS — R339 Retention of urine, unspecified: Secondary | ICD-10-CM | POA: Diagnosis not present

## 2021-09-24 LAB — BASIC METABOLIC PANEL
BUN/Creatinine Ratio: 22 (ref 12–28)
BUN: 21 mg/dL (ref 10–36)
CO2: 32 mmol/L — ABNORMAL HIGH (ref 20–29)
Calcium: 9.7 mg/dL (ref 8.7–10.3)
Chloride: 96 mmol/L (ref 96–106)
Creatinine, Ser: 0.97 mg/dL (ref 0.57–1.00)
Glucose: 147 mg/dL — ABNORMAL HIGH (ref 70–99)
Potassium: 4.5 mmol/L (ref 3.5–5.2)
Sodium: 140 mmol/L (ref 134–144)
eGFR: 53 mL/min/{1.73_m2} — ABNORMAL LOW (ref >59)

## 2021-09-24 NOTE — Patient Outreach (Signed)
[facility] Post- Acute Care Coordinator follow up. Per [name] eligible member currently resides in [facility]. Screened for potential [facility] Care Management services as a benefit of member's insurance plan. ? ?Facility site visit to [facility] and [place] facility. Met with Tara Bradley, SNF SW concerning member's transition plan and potential [facility] needs. Anticipated transition plan is to return home possibly on Friday, May 12th with home health and caregivers.  ? ?Spoke with Tara Bradley in room at [facility] to discuss transition plans and [place] Management services. Tara Bradley lives alone. States she intends on having caregiver assistance. States she is familiar with [facility] [employer] and will plan outreach. States she is also familiar with [employer] MOMs meals and [employer] transportation services post SNF/hospitalization. Denies need for writer to assist.  ? ?Discussed [facility] Care Management follow up. Tara Bradley is agreeable. Explained [facility] Care Management will not interfere with home health services.  ? ?Confirmed best contact number for Tara Bradley as [phone]. Secondary (cell) [phone]. ? ?Provided [facility] Care Management brochure, 24-hr nurse advice line magnet, and writer's contact information.  ? ?Will continue to follow for transition date and plan to make referral for [facility] upon SNF discharge. ? ? ?Tara Rolling, MSN, RN,BSN ?[place] Coordinator ?[phone] [facility]) ?[phone]  (Toll free office)  ? ? ? ? ?  ?

## 2021-09-28 DIAGNOSIS — R339 Retention of urine, unspecified: Secondary | ICD-10-CM | POA: Diagnosis not present

## 2021-09-28 DIAGNOSIS — R2681 Unsteadiness on feet: Secondary | ICD-10-CM | POA: Diagnosis not present

## 2021-09-28 DIAGNOSIS — S72142D Displaced intertrochanteric fracture of left femur, subsequent encounter for closed fracture with routine healing: Secondary | ICD-10-CM | POA: Diagnosis not present

## 2021-09-28 DIAGNOSIS — I509 Heart failure, unspecified: Secondary | ICD-10-CM | POA: Diagnosis not present

## 2021-09-28 DIAGNOSIS — J9601 Acute respiratory failure with hypoxia: Secondary | ICD-10-CM | POA: Diagnosis not present

## 2021-09-28 DIAGNOSIS — I4891 Unspecified atrial fibrillation: Secondary | ICD-10-CM | POA: Diagnosis not present

## 2021-09-28 DIAGNOSIS — R601 Generalized edema: Secondary | ICD-10-CM | POA: Diagnosis not present

## 2021-09-29 ENCOUNTER — Emergency Department (HOSPITAL_COMMUNITY): Payer: Medicare Other

## 2021-09-29 ENCOUNTER — Emergency Department (HOSPITAL_COMMUNITY)
Admission: EM | Admit: 2021-09-29 | Discharge: 2021-09-29 | Disposition: A | Discharge status: Home / Self Care | Payer: Medicare Other | Attending: Emergency Medicine | Admitting: Emergency Medicine

## 2021-09-29 ENCOUNTER — Ambulatory Visit: Payer: Medicare Other | Admitting: Podiatry

## 2021-09-29 DIAGNOSIS — Z7984 Long term (current) use of oral hypoglycemic drugs: Secondary | ICD-10-CM | POA: Insufficient documentation

## 2021-09-29 DIAGNOSIS — I482 Chronic atrial fibrillation, unspecified: Secondary | ICD-10-CM | POA: Insufficient documentation

## 2021-09-29 DIAGNOSIS — I499 Cardiac arrhythmia, unspecified: Secondary | ICD-10-CM | POA: Diagnosis not present

## 2021-09-29 DIAGNOSIS — Z79899 Other long term (current) drug therapy: Secondary | ICD-10-CM | POA: Insufficient documentation

## 2021-09-29 DIAGNOSIS — S2231XA Fracture of one rib, right side, initial encounter for closed fracture: Secondary | ICD-10-CM | POA: Diagnosis not present

## 2021-09-29 DIAGNOSIS — R791 Abnormal coagulation profile: Secondary | ICD-10-CM | POA: Diagnosis not present

## 2021-09-29 DIAGNOSIS — E119 Type 2 diabetes mellitus without complications: Secondary | ICD-10-CM | POA: Insufficient documentation

## 2021-09-29 DIAGNOSIS — R062 Wheezing: Secondary | ICD-10-CM | POA: Diagnosis not present

## 2021-09-29 DIAGNOSIS — Z723 Lack of physical exercise: Secondary | ICD-10-CM | POA: Diagnosis not present

## 2021-09-29 DIAGNOSIS — R0682 Tachypnea, not elsewhere classified: Secondary | ICD-10-CM | POA: Insufficient documentation

## 2021-09-29 DIAGNOSIS — J9600 Acute respiratory failure, unspecified whether with hypoxia or hypercapnia: Secondary | ICD-10-CM | POA: Diagnosis not present

## 2021-09-29 DIAGNOSIS — J90 Pleural effusion, not elsewhere classified: Secondary | ICD-10-CM | POA: Diagnosis not present

## 2021-09-29 DIAGNOSIS — I110 Hypertensive heart disease with heart failure: Secondary | ICD-10-CM | POA: Insufficient documentation

## 2021-09-29 DIAGNOSIS — J9811 Atelectasis: Secondary | ICD-10-CM | POA: Diagnosis not present

## 2021-09-29 DIAGNOSIS — R0602 Shortness of breath: Secondary | ICD-10-CM | POA: Diagnosis not present

## 2021-09-29 DIAGNOSIS — R000 Tachycardia, unspecified: Secondary | ICD-10-CM | POA: Insufficient documentation

## 2021-09-29 DIAGNOSIS — Z7901 Long term (current) use of anticoagulants: Secondary | ICD-10-CM | POA: Insufficient documentation

## 2021-09-29 DIAGNOSIS — R0689 Other abnormalities of breathing: Secondary | ICD-10-CM | POA: Diagnosis not present

## 2021-09-29 DIAGNOSIS — I4891 Unspecified atrial fibrillation: Secondary | ICD-10-CM | POA: Diagnosis not present

## 2021-09-29 DIAGNOSIS — Z794 Long term (current) use of insulin: Secondary | ICD-10-CM | POA: Insufficient documentation

## 2021-09-29 DIAGNOSIS — Z20822 Contact with and (suspected) exposure to covid-19: Secondary | ICD-10-CM | POA: Diagnosis not present

## 2021-09-29 DIAGNOSIS — R6889 Other general symptoms and signs: Secondary | ICD-10-CM

## 2021-09-29 DIAGNOSIS — R609 Edema, unspecified: Secondary | ICD-10-CM | POA: Diagnosis not present

## 2021-09-29 DIAGNOSIS — I5032 Chronic diastolic (congestive) heart failure: Secondary | ICD-10-CM | POA: Insufficient documentation

## 2021-09-29 DIAGNOSIS — Z91040 Latex allergy status: Secondary | ICD-10-CM | POA: Insufficient documentation

## 2021-09-29 DIAGNOSIS — I5030 Unspecified diastolic (congestive) heart failure: Secondary | ICD-10-CM | POA: Diagnosis not present

## 2021-09-29 LAB — CBC WITH DIFFERENTIAL/PLATELET
Abs Immature Granulocytes: 0.03 10*3/uL (ref 0.00–0.07)
Basophils Absolute: 0.1 10*3/uL (ref 0.0–0.1)
Basophils Relative: 1 %
Eosinophils Absolute: 0.2 10*3/uL (ref 0.0–0.5)
Eosinophils Relative: 2 %
HCT: 32 % — ABNORMAL LOW (ref 36.0–46.0)
Hemoglobin: 9.9 g/dL — ABNORMAL LOW (ref 12.0–15.0)
Immature Granulocytes: 1 %
Lymphocytes Relative: 7 %
Lymphs Abs: 0.5 10*3/uL — ABNORMAL LOW (ref 0.7–4.0)
MCH: 29.8 pg (ref 26.0–34.0)
MCHC: 30.9 g/dL (ref 30.0–36.0)
MCV: 96.4 fL (ref 80.0–100.0)
Monocytes Absolute: 0.6 10*3/uL (ref 0.1–1.0)
Monocytes Relative: 9 %
Neutro Abs: 5.4 10*3/uL (ref 1.7–7.7)
Neutrophils Relative %: 80 %
Platelets: 200 10*3/uL (ref 150–400)
RBC: 3.32 MIL/uL — ABNORMAL LOW (ref 3.87–5.11)
RDW: 15.6 % — ABNORMAL HIGH (ref 11.5–15.5)
WBC: 6.7 10*3/uL (ref 4.0–10.5)
nRBC: 0 % (ref 0.0–0.2)

## 2021-09-29 LAB — I-STAT CHEM 8, ED
BUN: 20 mg/dL (ref 8–23)
Calcium, Ion: 1.18 mmol/L (ref 1.15–1.40)
Chloride: 92 mmol/L — ABNORMAL LOW (ref 98–111)
Creatinine, Ser: 0.7 mg/dL (ref 0.44–1.00)
Glucose, Bld: 118 mg/dL — ABNORMAL HIGH (ref 70–99)
HCT: 32 % — ABNORMAL LOW (ref 36.0–46.0)
Hemoglobin: 10.9 g/dL — ABNORMAL LOW (ref 12.0–15.0)
Potassium: 3.9 mmol/L (ref 3.5–5.1)
Sodium: 137 mmol/L (ref 135–145)
TCO2: 36 mmol/L — ABNORMAL HIGH (ref 22–32)

## 2021-09-29 LAB — I-STAT VENOUS BLOOD GAS, ED
Acid-Base Excess: 14 mmol/L — ABNORMAL HIGH (ref 0.0–2.0)
Bicarbonate: 40 mmol/L — ABNORMAL HIGH (ref 20.0–28.0)
Calcium, Ion: 1.19 mmol/L (ref 1.15–1.40)
HCT: 31 % — ABNORMAL LOW (ref 36.0–46.0)
Hemoglobin: 10.5 g/dL — ABNORMAL LOW (ref 12.0–15.0)
O2 Saturation: 88 %
Potassium: 3.9 mmol/L (ref 3.5–5.1)
Sodium: 137 mmol/L (ref 135–145)
TCO2: 42 mmol/L — ABNORMAL HIGH (ref 22–32)
pCO2, Ven: 57.5 mmHg (ref 44–60)
pH, Ven: 7.45 — ABNORMAL HIGH (ref 7.25–7.43)
pO2, Ven: 53 mmHg — ABNORMAL HIGH (ref 32–45)

## 2021-09-29 LAB — COMPREHENSIVE METABOLIC PANEL
ALT: 9 U/L (ref 0–44)
AST: 15 U/L (ref 15–41)
Albumin: 3.6 g/dL (ref 3.5–5.0)
Alkaline Phosphatase: 132 U/L — ABNORMAL HIGH (ref 38–126)
Anion gap: 8 (ref 5–15)
BUN: 17 mg/dL (ref 8–23)
CO2: 34 mmol/L — ABNORMAL HIGH (ref 22–32)
Calcium: 9.5 mg/dL (ref 8.9–10.3)
Chloride: 96 mmol/L — ABNORMAL LOW (ref 98–111)
Creatinine, Ser: 0.74 mg/dL (ref 0.44–1.00)
GFR, Estimated: >60 mL/min (ref >60)
Glucose, Bld: 121 mg/dL — ABNORMAL HIGH (ref 70–99)
Potassium: 3.9 mmol/L (ref 3.5–5.1)
Sodium: 138 mmol/L (ref 135–145)
Total Bilirubin: 0.7 mg/dL (ref 0.3–1.2)
Total Protein: 6.2 g/dL — ABNORMAL LOW (ref 6.5–8.1)

## 2021-09-29 LAB — D-DIMER, QUANTITATIVE: D-Dimer, Quant: 2.28 ug/mL-FEU — ABNORMAL HIGH (ref 0.00–0.50)

## 2021-09-29 LAB — RESP PANEL BY RT-PCR (FLU A&B, COVID) ARPGX2
Influenza A by PCR: NEGATIVE
Influenza B by PCR: NEGATIVE
SARS Coronavirus 2 by RT PCR: NEGATIVE

## 2021-09-29 LAB — TROPONIN I (HIGH SENSITIVITY): Troponin I (High Sensitivity): 8 ng/L (ref <18)

## 2021-09-29 LAB — MAGNESIUM: Magnesium: 1.8 mg/dL (ref 1.7–2.4)

## 2021-09-29 LAB — BRAIN NATRIURETIC PEPTIDE: B Natriuretic Peptide: 380.1 pg/mL — ABNORMAL HIGH (ref 0.0–100.0)

## 2021-09-29 MED ORDER — POTASSIUM CHLORIDE CRYS ER 20 MEQ PO TBCR
20.0000 meq | EXTENDED_RELEASE_TABLET | Freq: Once | ORAL | Status: AC
  Administered 2021-09-29: 20 meq via ORAL
  Filled 2021-09-29: qty 1

## 2021-09-29 MED ORDER — IOHEXOL 350 MG/ML SOLN
100.0000 mL | Freq: Once | INTRAVENOUS | Status: AC | PRN
Start: 2021-09-29 — End: 2021-09-29
  Administered 2021-09-29: 100 mL via INTRAVENOUS

## 2021-09-29 MED ORDER — FUROSEMIDE 10 MG/ML IJ SOLN
40.0000 mg | Freq: Once | INTRAMUSCULAR | Status: AC
Start: 2021-09-29 — End: 2021-09-29
  Administered 2021-09-29: 40 mg via INTRAVENOUS
  Filled 2021-09-29: qty 4

## 2021-09-29 MED ORDER — MAGNESIUM OXIDE -MG SUPPLEMENT 400 (240 MG) MG PO TABS
800.0000 mg | ORAL_TABLET | Freq: Once | ORAL | Status: AC
  Administered 2021-09-29: 800 mg via ORAL
  Filled 2021-09-29: qty 2

## 2021-09-29 NOTE — Discharge Instructions (Addendum)
Follow-up with your cardiologist to discuss your ongoing symptoms and any possible medication changes to optimize your cardiac and pulmonary function.  If you experience any worsening of symptoms, please return to the emergency department. ?

## 2021-09-29 NOTE — ED Provider Notes (Addendum)
?[place] ?Provider Note ? ? ?CSN: [id number] ?Arrival date & time:    ? ?  ? ?History ? ?Chief Complaint  ?Patient presents with  ? Shortness of Breath  ? ? ?Tara Bradley is a 88 y.o. female. ? ?HPI ?Patient presents for shortness of breath.  She arrives from a rehab facility, where she has been living following some orthopedic injuries.  Her medical history includes chronic atrial fibrillation on Xarelto, diastolic CHF, DM 2, HLD, HTN, osteoporosis, thoracic aortic aneurysm.  She is on metoprolol and Cardizem for rate control.  She had a 2-week hospitalization last month for shortness of breath secondary to CHF exacerbation.  She states that she has continued to have shortness of breath since her discharge.  Per chart review, she was discharged on 4 L of supplemental oxygen, which she remains on.  She has ongoing exercise intolerance and becomes severely short of breath with any exertion.  She takes 40 mg of p.o. Lasix daily.  She did receive a dose prior to arrival.  She was also given a albuterol breathing treatment, which she states does not improve her breathing.  When EMS arrived on scene, she was tachycardic in the range of 150-160.  During transit, she was given 2, 10 mg doses of Cardizem.  Heart rate improved to 110s to 120s.  Currently, she endorses baseline shortness of breath.  She denies any areas of discomfort.  Patient states that there was a new PA on staff at her facility.  After the patient had exerted herself, she was witnessed to be short of breath and this provider is a one who instructed EMS to be called.  Patient was seen by her cardiology office 1 week ago.  No medication changes were made at that time. ?  ? ?Home Medications ?Prior to Admission medications   ?Medication Sig Start Date End Date Taking? Authorizing Provider  ?BD PEN NEEDLE NANO U/F 32G X 4 MM MISC Inject into the skin daily. 11/25/20   [provider]  ?Calcium  Carbonate-Vitamin D 600-400 MG-UNIT tablet Take 1 tablet by mouth daily.    [provider]  ?Cholecalciferol (VITAMIN D) 125 MCG (5000 UT) CAPS Take 5,000 Units by mouth daily.    [provider]  ?Dextromethorphan-guaiFENesin 5-100 MG/5ML LIQD Take 10 mLs by mouth 3 (three) times daily.    [provider]  ?diltiazem (CARDIZEM CD) 360 MG 24 hr capsule Take 1 capsule (360 mg total) by mouth daily. 05/12/21   Shelly Coss, MD  ?eszopiclone (LUNESTA) 2 MG TABS tablet Take 2 mg by mouth at bedtime. Take immediately before bedtime    [provider]  ?ferrous gluconate (FERGON) 324 MG tablet Take 324 mg by mouth 2 (two) times daily with a meal.    [provider]  ?furosemide (LASIX) 40 MG tablet Take 2 tablets (80 mg total) by mouth 2 (two) times daily for 4 days, THEN 1 tablet (40 mg total) 2 (two) times daily. Take extra 40 mg tablet for weight gain of 3 lbs in 1 day or 5 lbs in 1 week. 09/15/21 11/18/21  Lavina Hamman, MD  ?gabapentin (NEURONTIN) 100 MG capsule Take 100 mg by mouth 3 (three) times daily. 08/14/21   [provider]  ?insulin glargine (LANTUS) 100 UNIT/ML Solostar Pen Inject 8 Units into the skin daily.    [provider]  ?insulin lispro (HUMALOG) 100 UNIT/ML injection Inject 0-12 Units into the skin daily. Sliding  scale : If blood sugar less than 70, call NP ?70-200 = 0 units ?201-250 = 2 units ?251-300 = 4 units ?301-350 = 6 units ?351-400 = 8 units ?401-450 = 10 units ?451-600 = 12 units ?If greater than 450 give 12 units. Recheck in 2 hours. If >350 or <100 notify provider.    [provider]  ?ipratropium-albuterol (DUONEB) 0.5-2.5 (3) MG/3ML SOLN Take 3 mLs by nebulization 2 (two) times daily.    [provider]  ?magnesium oxide (MAG-OX) 400 MG tablet Take 400 mg by mouth at bedtime.    [provider]  ?metFORMIN (GLUMETZA) 500 MG (MOD) 24 hr tablet Take 1,000 mg by mouth every evening.    [provider]  ?metoprolol succinate (TOPROL-XL) 100 MG 24 hr tablet Take 1 tablet (100 mg total) by mouth daily. Take with or immediately following a meal. 09/16/21   Lavina Hamman, MD  ?polyethylene glycol (MIRALAX / GLYCOLAX) 17 g packet Take 17 g by mouth daily. 05/12/21   Shelly Coss, MD  ?potassium chloride (KLOR-CON) 10 MEQ tablet Take 2 tablets (20 mEq total) by mouth daily. 09/15/21   Lavina Hamman, MD  ?senna (SENOKOT) 8.6 MG TABS tablet Take 1 tablet (8.6 mg total) by mouth 2 (two) times daily. 08/07/21   Shelly Coss, MD  ?tamsulosin (FLOMAX) 0.4 MG CAPS capsule Take 1 capsule (0.4 mg total) by mouth daily. 08/08/21   Shelly Coss, MD  ?traZODone (DESYREL) 50 MG tablet Take 50 mg by mouth at bedtime as needed for sleep.    [provider]  ?white petrolatum (VASELINE) GEL Apply 1 application. topically in the morning, at noon, in the evening, and at bedtime.    [provider]  ?XARELTO 15 MG TABS tablet TAKE 1 TABLET ONCE DAILY WITH SUPPER. ?Patient taking differently: Take 15 mg by mouth daily with supper. 06/22/21   [name], MD  ?   ? ?Allergies    ?Ambien [zolpidem], Codeine, Codeine sulfate, Dulaglutide, Latex, Morphine sulfate, and Mirtazapine   ? ?Review of Systems   ?Review of Systems  ?Constitutional:  Positive for fatigue.  ?Respiratory:  Positive for shortness of breath.   ?All other systems reviewed and are negative. ? ?Physical Exam ?Updated Vital Signs ?BP 127/73   Pulse 90   Temp 98 ?F (36.7 ?C) (Oral)   Resp (!) 26   Wt 63.5 kg   SpO2 99%   BMI 22.94 kg/m?  ?Physical Exam ?Vitals and nursing note reviewed.  ?Constitutional:   ?   General: She is not in acute distress. ?   Appearance: Normal appearance. She is well-developed and normal weight. She is not ill-appearing, toxic-appearing or diaphoretic.  ?HENT:  ?   Head: Normocephalic and atraumatic.  ?   Right Ear: External ear normal.  ?   Left Ear: External ear normal.  ?   Nose: Nose normal.  ?    Mouth/Throat:  ?   Mouth: Mucous membranes are moist.  ?   Pharynx: Oropharynx is clear.  ?Eyes:  ?   Extraocular Movements: Extraocular movements intact.  ?   Conjunctiva/sclera: Conjunctivae normal.  ?Cardiovascular:  ?   Rate and Rhythm: Tachycardia present. Rhythm irregular.  ?   Heart sounds: No murmur heard. ?Pulmonary:  ?   Effort: Pulmonary effort is normal. Tachypnea present. No respiratory distress.  ?   Breath sounds: No stridor. Rales present. No rhonchi.  ?Chest:  ?   Chest wall: No tenderness or edema.  ?  Abdominal:  ?   General: There is no distension.  ?   Palpations: Abdomen is soft.  ?   Tenderness: There is no abdominal tenderness.  ?Musculoskeletal:     ?   General: No swelling, tenderness or deformity.  ?   Cervical back: Normal range of motion and neck supple. No rigidity.  ?   Right lower leg: No edema.  ?   Left lower leg: No edema.  ?Skin: ?   General: Skin is warm and dry.  ?   Capillary Refill: Capillary refill takes less than 2 seconds.  ?   Coloration: Skin is not jaundiced or pale.  ?Neurological:  ?   General: No focal deficit present.  ?   Mental Status: She is alert and oriented to person, place, and time.  ?   Cranial Nerves: No cranial nerve deficit.  ?   Sensory: No sensory deficit.  ?   Motor: No weakness.  ?   Coordination: Coordination normal.  ?Psychiatric:     ?   Mood and Affect: Mood normal.     ?   Behavior: Behavior normal.     ?   Thought Content: Thought content normal.     ?   Judgment: Judgment normal.  ? ? ?ED Results / Procedures / Treatments   ?Labs ?(all labs ordered are listed, but only abnormal results are displayed) ?Labs Reviewed  ?COMPREHENSIVE METABOLIC PANEL - Abnormal; Notable for the following components:  ?    Result Value  ? Chloride 96 (*)   ? CO2 34 (*)   ? Glucose, Bld 121 (*)   ? Total Protein 6.2 (*)   ? Alkaline Phosphatase 132 (*)   ? All other components within normal limits  ?CBC WITH DIFFERENTIAL/PLATELET - Abnormal; Notable for the following  components:  ? RBC 3.32 (*)   ? Hemoglobin 9.9 (*)   ? HCT 32.0 (*)   ? RDW 15.6 (*)   ? Lymphs Abs 0.5 (*)   ? All other components within normal limits  ?BRAIN NATRIURETIC PEPTIDE - Abnormal; Notable for the following

## 2021-09-29 NOTE — ED Triage Notes (Signed)
Lives in [place]. EMS reports 1-3 LPM Real O2 for pneumonia. Call was for sob. EMS reports pt got 1 breathing treatment from facility. EKG was showing afib RVR with rate of 160s. Cardizem given total of '20mg'$  IVP. Alert and oriented x 4. Denies cp.  ?

## 2021-09-30 ENCOUNTER — Telehealth: Payer: Self-pay | Admitting: Student

## 2021-09-30 DIAGNOSIS — S72142D Displaced intertrochanteric fracture of left femur, subsequent encounter for closed fracture with routine healing: Secondary | ICD-10-CM | POA: Diagnosis not present

## 2021-09-30 DIAGNOSIS — I509 Heart failure, unspecified: Secondary | ICD-10-CM | POA: Diagnosis not present

## 2021-09-30 DIAGNOSIS — E1159 Type 2 diabetes mellitus with other circulatory complications: Secondary | ICD-10-CM | POA: Diagnosis not present

## 2021-09-30 DIAGNOSIS — I5030 Unspecified diastolic (congestive) heart failure: Secondary | ICD-10-CM | POA: Diagnosis not present

## 2021-09-30 DIAGNOSIS — I4891 Unspecified atrial fibrillation: Secondary | ICD-10-CM | POA: Diagnosis not present

## 2021-09-30 DIAGNOSIS — I119 Hypertensive heart disease without heart failure: Secondary | ICD-10-CM | POA: Diagnosis not present

## 2021-09-30 DIAGNOSIS — J9601 Acute respiratory failure with hypoxia: Secondary | ICD-10-CM | POA: Diagnosis not present

## 2021-09-30 DIAGNOSIS — R339 Retention of urine, unspecified: Secondary | ICD-10-CM | POA: Diagnosis not present

## 2021-09-30 DIAGNOSIS — R2681 Unsteadiness on feet: Secondary | ICD-10-CM | POA: Diagnosis not present

## 2021-09-30 DIAGNOSIS — R601 Generalized edema: Secondary | ICD-10-CM | POA: Diagnosis not present

## 2021-09-30 NOTE — Telephone Encounter (Signed)
Called Tara Phoenix NP back. She is advocating for the patient and was complaining about the patient's visit to the ED at [facility]. Patient's primary cardiologist is Dr. Percival Spanish, made patient the next available appointment with him on Friday. Per ED note patient needs to see Cardiology in 72 hours. The NP stated she sent the patient to the ED due to O2 dropping, SOB, and patient being in A. FIB with RVR, HR 150's and 160's. She stated she was shocked at how patient was treated at the ED and did not agree with their plan. She stated that cardiology was not even consulted while the patient was in the ED. She stated patient was doing great before her Hip surgery and now she is having to be on 4 L of oxygen, having CHF and SOB. She would like to talk to Dr. Percival Spanish and would like him to call her at number listed. [phone] ?

## 2021-09-30 NOTE — Telephone Encounter (Signed)
Earmon Phoenix, a practitioner from [facility] would like to speak to a nurse about this patient.  She was in the emergency room yesterday.  Truman Hayward can be reached at [phone], this is her personal number.  ?

## 2021-10-01 ENCOUNTER — Other Ambulatory Visit: Payer: Self-pay | Admitting: *Deleted

## 2021-10-01 DIAGNOSIS — I5042 Chronic combined systolic (congestive) and diastolic (congestive) heart failure: Secondary | ICD-10-CM | POA: Diagnosis not present

## 2021-10-01 DIAGNOSIS — S72142D Displaced intertrochanteric fracture of left femur, subsequent encounter for closed fracture with routine healing: Secondary | ICD-10-CM | POA: Diagnosis not present

## 2021-10-01 DIAGNOSIS — J9601 Acute respiratory failure with hypoxia: Secondary | ICD-10-CM | POA: Diagnosis not present

## 2021-10-01 DIAGNOSIS — R339 Retention of urine, unspecified: Secondary | ICD-10-CM | POA: Diagnosis not present

## 2021-10-01 DIAGNOSIS — R601 Generalized edema: Secondary | ICD-10-CM | POA: Diagnosis not present

## 2021-10-01 DIAGNOSIS — I509 Heart failure, unspecified: Secondary | ICD-10-CM | POA: Diagnosis not present

## 2021-10-01 DIAGNOSIS — I4891 Unspecified atrial fibrillation: Secondary | ICD-10-CM | POA: Diagnosis not present

## 2021-10-01 DIAGNOSIS — R2681 Unsteadiness on feet: Secondary | ICD-10-CM | POA: Diagnosis not present

## 2021-10-01 DIAGNOSIS — R0689 Other abnormalities of breathing: Secondary | ICD-10-CM | POA: Diagnosis not present

## 2021-10-01 NOTE — Patient Outreach (Signed)
[facility] Post- Acute Care Coordinator follow up. Per [facility] eligible member currently resides in [facility].  Screened for potential [facility] Care Management services as a benefit of member's insurance plan. ? ?Facility site visit to [facility] skilled nursing facility. Met with Tara Bradley, SNF SW  concerning member's progress, transition plan, and potential [facility] needs. Anticipated transition plan is to return home on Monday, May 15th with home health and caregivers thru [facility]. Daughter is coming from [place] to stay with member for weeks post SNF. Discussed member could also benefit from  home visits thru [facility]. Member has cardiology appointment scheduled for tomorrow.  ? ?Spoke with Tara Bradley at bedside. Confirms she plans to return home. Confirmed above details regarding transition plans. Remains agreeable to [facility] follow up. Also agreeable to [facility] referral.  ? ?Secure message sent to SNF SW to inquire about palliative follow up.  ? ?Will plan to make [facility] referral and [facility] referral.  ? ? ?Marthenia Rolling, MSN, RN,BSN ?[facility] Coordinator ?[phone] [facility]) ?[phone]  (Toll free office)  ? ? ?

## 2021-10-01 NOTE — Telephone Encounter (Signed)
I received a message to give Tracie Harrier, PA-C, a call. Loree Fee is one of the Physiatry PAs at [facility] who has been helping to take care of Tara Bradley. I am very familiar with Tara Bradley. She was a very functional 88 year old who was living alone and fully independent prior to suffering a mechanical fall in 07/2021 and fracturing her hip. I saw her during this admission and have seen her multiple times since. She he has had a significant decline in functional status and has struggled with acute CHF since her hip surgery. At her visit with me on 09/02/2021, she was noted to be 30 lbs up from where she was in 06/2021 with significant lower extremity and dyspnea on exertion. She was sent to the ED where she was admitted for acute on chronic diastolic CHF. She was there for almost 2 weeks being diuresed. I saw her for hospital follow-up on 09/23/2021 at which time she was still have some dyspnea on exertion requiring 3L of O2 (she was not on any O2 prior to her hip surgery) and lower extremity edema but weight was down 23 lbs since prior visit and she looked remarkably better. Since then, she has had another decline. She was seen in the ED on 09/29/2021 for rapid atrial fibrillation and worsening respiratory distress. She was given 2 doses of IV Cardizem in route to the ED with improvement in rates. BNP was elevated at 380 but lower than it was 2 weeks prior. Chest CTA was negative for PE but showed cardiac enlargement, bilateral pleural effusion, and pulmonary edema consistent with CHF. She was given a dose of IV Lasix with some improvement in her breathing. Admission was offered but patient declined. She has a follow-up visit with Dr. Percival Spanish tomorrow. ? ?Whitney called because she wanted to give Tara Bradley a heads up that Tara Bradley is very hesitant to go back to the hospital because the last time she was there she was essentially by herself in the hospital for 2 weeks. Loree Fee has tried to prepare the  patient that we may recommend she be readmitted at her visit tomorrow and has encouraged her to be open to this if that happens. 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 Patient is having daily weights done and reportedly her weight is up 7lbs since her last visit with me on 09/23/2021 but no provider was notified of this. Loree Fee has developed a good relationship with the patient and is trying to advocate for her. We discussed her dose of Lasix - her kidney function was normal in the ED. Her BP was soft at her last visit with me but I recommended increasing Lasix to '80mg'$  twice daily as long as systolic BP >923. Also recommended increasing KCl to 40 mEq daily with higher dose of Lasix. We also discussed that there was concern that she may have underlying chronic pulmonary lung disease based on high resolution CT during admission in 08/2021. Loree Fee has already referred patient to Pulmonology. She did bring up concern that patient has a history of breast cancer and was wondering if that needs to be considered given her pleural effusions. I suspect that her pleural effusions are secondary to her CHF but that is definitely something  to keep in mind. Loree Fee was very appreciative of the call and I thanked her for advocating so well for the patient and keeping Tara Bradley updated.  ? ?I am not going to be in the office tomorrow so I will route this note to Dr. Percival Spanish so that he is aware of everything going on at his visit with her.  ? ?Darreld Mclean, PA-C ?10/01/2021 3:50 PM ? ?

## 2021-10-02 ENCOUNTER — Encounter: Payer: Self-pay | Admitting: Cardiology

## 2021-10-02 ENCOUNTER — Ambulatory Visit (INDEPENDENT_AMBULATORY_CARE_PROVIDER_SITE_OTHER): Payer: Medicare Other | Admitting: Cardiology

## 2021-10-02 VITALS — BP 94/50 | HR 94 | Ht 66.0 in | Wt 154.0 lb

## 2021-10-02 DIAGNOSIS — R0689 Other abnormalities of breathing: Secondary | ICD-10-CM | POA: Diagnosis not present

## 2021-10-02 DIAGNOSIS — I5042 Chronic combined systolic (congestive) and diastolic (congestive) heart failure: Secondary | ICD-10-CM | POA: Diagnosis not present

## 2021-10-02 DIAGNOSIS — I5032 Chronic diastolic (congestive) heart failure: Secondary | ICD-10-CM | POA: Diagnosis not present

## 2021-10-02 NOTE — Progress Notes (Signed)
?Cardiology Office Note:   ? ?Date:  10/07/2021  ? ?ID:  Tara Bradley, DOB [birth year], MRN [id number] ? ?PCP:  [name], MD  ?Cardiologist:  [name], MD  ?Electrophysiologist:  None  ? ?Referring MD: [name], MD  ? ?Chief Complaint: follow-up of CHF ? ?History of Present Illness:   ? ?Tara Bradley is a 88 y.o. female with a history of chronic atrial fibrillation Xarelto, chronic diastolic CHF, aortic aneurysm with chronic descending dissection, aortic valve sclerosis, hypertension, hyperlipidemia, type 2 diabetes mellitus, and recent left hip fracture who is followed by Dr. Percival Spanish and presents today for follow-up of CHF. ? ?I am very familiar with Tara Bradley. She was a very functional 88 year old who was living alone and fully independent prior to suffering a mechanical fall in 07/2021 and fracturing her hip. I saw her during this admission and have seen her multiple times since. She he has had a significant decline in functional status and has struggled with acute CHF since her hip surgery. At her visit with me on 09/02/2021, she was noted to be 30 lbs up from where she was in 06/2021 with significant lower extremity and dyspnea on exertion. She was sent to the ED where she was admitted for acute on chronic diastolic CHF. Echo showed LVEF of 60-65%, moderately enlarged RV with mildly reduced systolic function, biatrial enlargement, moderate TR, moderately elevated PASP of 59.0 mmHg, and a lateral pleural effusion. She was there for almost 2 weeks being diuresed. I saw her for hospital follow-up on 09/23/2021 at which time she was still having some dyspnea on exertion requiring 3L of O2 (she was not on any O2 prior to her hip surgery) and lower extremity edema but weight was down 23 lbs since prior visit and she looked remarkably better. Since then, she has had another decline. She was seen in the ED on 09/29/2021 for rapid atrial fibrillation and worsening respiratory distress. She was given  2 doses of IV Cardizem in route to the ED with improvement in rates. BNP was elevated at 380 but lower than it was 2 weeks prior. Chest CTA was negative for PE but showed cardiac enlargement, bilateral pleural effusion, and pulmonary edema consistent with CHF. She was given a dose of IV Lasix with some improvement in her breathing. Admission was offered but patient declined. I spoke with Tracie Harrier, one of the PA-C, at [place] on 10/01/2021 who expressed concern that patient may need to be readmitted but that patient would be very hesitant of this. She also reported that patient was up about 7 lbs since by visit with her on 09/23/2021. I recommended increasing her Lasix to 54m twice daily and increasing her KCl to 429m daily. She was seen by Dr. HoPercival Spanishn 10/02/2021 at which time she weighed 154 lbs (up from 146 lbs on 09/23/2021) but she denied any increased shortness and reported good urinary output with IV Lasix in the ED and with higher dose of Lasix. She was continued on Lasix 8030mwice daily and close follow-up was arranged. ?  ?Patient presents today for follow-up. Here with [place] month) and home caregiver. Patient came home from [place] on Monday. She weighs 150 lbs today in the office but was 144 lbs on home scales this morning (down from 149 lbs yesterday). She continues to have  dyspnea with minimal exertion and states she will get very short of breath gasping  for air just walking a few steps; however, this will improve after taking a few deep breath. Patient states her breathing is actually a little better than before. She is still requiring 4L of O2 via nasal cannula 24/7. She continues to have orthopnea which she has had since fracturing her hip. No PND. She continues to have lower extremity edema but much improved from last month. It sounds like she is probably getting more than 2g of sodium per day and we discussed the importance of a low sodium  diet. No chest pain. No significant palpitations. She does report some bradycardia at times with heart rates as low as the 40s to 50s but does not sound like rates stay there for long. No significant lightheadedness/dizziness.  ? ?Past Medical History:  ?Diagnosis Date  ? Aortic valve sclerosis   ? Echo, 2008  ? Arthritis   ? Bradycardia   ? October, 2012  ? Cancer [facility])   ? Chronic atrial fibrillation (HCC)   ? Chronic diastolic CHF (congestive heart failure) ([place])   ? Chronic insomnia   ? Closed fracture of unspecified part of femur 2005  ? Diabetes mellitus, type 2 ([place])   ? Diverticulitis   ? Diverticulosis   ? Hyperlipidemia   ? Hypertension   ? Long term (current) use of anticoagulants   ? Osteopenia   ? Osteoporosis   ? femur fracture 2005, pelvic fracture 2006  ? Personal history of malignant neoplasm of breast   ? Pneumonia   ? Syncope   ? Thoracic aortic aneurysm ([place]) 02/2017  ? chronic descending aorta dissection  ? Unspecified closed fracture of pelvis 2006  ? ? ?Past Surgical History:  ?Procedure Laterality Date  ? CARDIOVERSION N/A 08/09/2012  ? Procedure: CARDIOVERSION;  Surgeon: Deboraha Sprang, MD;  Location: [place];  Service: Cardiovascular;  Laterality: N/A;  ? EYE SURGERY    ? FEMUR SURGERY  2005  ? ORIF  ? INTRAMEDULLARY (IM) NAIL INTERTROCHANTERIC Left 08/02/2021  ? Procedure: INTRAMEDULLARY (IM) NAIL INTERTROCHANTRIC;  Surgeon: Willaim Sheng, MD;  Location: [place];  Service: Orthopedics;  Laterality: Left;  ? IR ANGIOGRAM PELVIS SELECTIVE OR SUPRASELECTIVE  03/11/2017  ? IR ANGIOGRAM SELECTIVE EACH ADDITIONAL VESSEL  03/11/2017  ? IR EMBO ART  VEN HEMORR LYMPH EXTRAV  INC GUIDE ROADMAPPING  03/11/2017  ? IR FLUORO GUIDE CV LINE RIGHT  03/11/2017  ? IR US GUIDE VASC ACCESS RIGHT  03/11/2017  ? IR US GUIDE VASC ACCESS RIGHT  03/11/2017  ? MASTECTOMY  1995  ? left  ? MINOR HARDWARE REMOVAL Left 09/01/2020  ? Procedure: REMOVAL OF HARDWARE LEFT FOREARM;  Surgeon: Milly Jakob, MD;   Location: [facility];  Service: Orthopedics;  Laterality: Left;  ? ORIF ULNAR FRACTURE Left 10/19/2019  ? Procedure: OPEN REDUCTION INTERNAL FIXATION (ORIF)  LEFT DISTAL RADIUS  AND ULNA FRACTURE;  Surgeon: Milly Jakob, MD;  Location: [facility];  Service: Orthopedics;  Laterality: Left;  ? REVERSE SHOULDER ARTHROPLASTY Right 05/04/2021  ? Procedure: REVERSE SHOULDER ARTHROPLASTY;  Surgeon: Netta Cedars, MD;  Location: [facility];  Service: Orthopedics;  Laterality: Right;  ? SHOULDER SURGERY    ? left  ? [place], 2003  ? TONSILLECTOMY    ? TOTAL ABDOMINAL HYSTERECTOMY W/ BILATERAL SALPINGOOPHORECTOMY  1995  ? WRIST SURGERY    ?  x 2   ? ? ?Current Medications: ?No outpatient medications have been marked as taking for the 10/07/21 encounter (Office Visit) with  Darreld Mclean, PA-C.  ?  ? ?Allergies:   Ambien [zolpidem], Codeine, Codeine sulfate, Dulaglutide, Latex, Morphine sulfate, and Mirtazapine  ? ?Social History  ? ?Socioeconomic History  ? Marital status: Widowed  ?  Spouse name: Not on file  ? Number of children: 2  ? Years of education: Not on file  ? Highest education level: Not on file  ?Occupational History  ? Occupation: English as a second language teacher, retired  ?Tobacco Use  ? Smoking status: Never  ? Smokeless tobacco: Never  ?Vaping Use  ? Vaping Use: Never used  ?Substance and Sexual Activity  ? Alcohol use: No  ? Drug use: No  ? Sexual activity: Not Currently  ?Other Topics Concern  ? Not on file  ?Social History Narrative  ? Husband had advancing Dementia, which was been very difficult for her. He died 12-10-16.  ? She was scheduled to go into [facility] 11/02.  ? ?Social Determinants of Health  ? ?Financial Resource Strain: Not on file  ?Food Insecurity: Not on file  ?Transportation Needs: Not on file  ?Physical Activity: Not on file  ?Stress: Not on file  ?Social Connections: Not on file  ?  ? ?Family History: ?The patient's family history includes Congestive Heart  Failure (age of onset: 21) in her mother; Diabetes type II in her brother, father, and son; Heart attack (age of onset: 26) in her father; Multiple myeloma in her brother. There is no history of Colon cancer. ?

## 2021-10-02 NOTE — Progress Notes (Signed)
?  ? ?Cardiology Office Note ? ? ?Date:  10/02/2021  ? ?ID:  [name], DOB 08-23-1922, MRN [id number] ? ?PCP:  Tara Jordan, MD  ?Cardiologist: Dr. Percival Bradley, 08/15/2017 ?Tara Breeding, MD 05/26/2017 ? ? ?Chief Complaint  ?Patient presents with  ? Edema  ? ? ? ?History of Present Illness: ?Tara Bradley is a 88 y.o. female with a history of an aortic thrombosed descending aortic dissection, aortic valve sclerosis and chronic afib on Xarelto.  She was in the hospital in April and we did see her for this.  She had acute on chronic diastolic heart failure.  She was treated with IV Lasix.  She had a 10 L diuresis.  She was sent home on 40 mg of Lasix twice daily.  She was sent home with some increased potassium.  She was on Xarelto for her chronic atrial fibrillation.  She was bradycardic and digoxin was stopped.    ? ?She has been limited since mechanical fall in 08/10/2021 fracturing her hip.  She had to have hip surgery.  She has been on 4 L oxygen and rehab following this.  She was in the emergency room on 5/9 with atrial fibs and rapid rate.  She was treated with IV Cardizem.  I did review these records for this visit.  Chest CT was negative for PE.  However, she has bilateral pleural effusions.  She did have edema.  She was treated with IV Lasix.  She declined admission.  We got a call from nurse practitioner who said the patient would be very hesitant about being readmitted but did agree to come to this visit.  The nurse practitioner did not think she could get necessarily the care she needed at her rehab facility.  It was recommended over the phone that the patient increase the Lasix to 80 mg twice daily and increase the potassium.  The patient was to be referred to pulmonary because of underlying possible lung disease. ? ?She said she did urinate quite a bit in the hospital when she got her IV Lasix.  She feels like she urinated yesterday.  She is not having any increased shortness of breath.  She is on  her 4 L.  She denies any chest pressure, neck or arm discomfort.  She is not having any palpitations, presyncope or syncope.  He is unchanged.  I am able to see that her weights seem to have plateaued at 154 pounds so we can follow this.  Her oxygen saturation on 4 L has been in the mid 90s. ? ? ?Past Medical History:  ?Diagnosis Date  ? Aortic valve sclerosis   ? Echo, 2008  ? Arthritis   ? Bradycardia   ? October, 2012  ? Cancer [facility])   ? Chronic atrial fibrillation (HCC)   ? Chronic diastolic CHF (congestive heart failure) ([place])   ? Chronic insomnia   ? Closed fracture of unspecified part of femur 2005  ? Diabetes mellitus, type 2 ([place])   ? Diverticulitis   ? Diverticulosis   ? Hyperlipidemia   ? Hypertension   ? Long term (current) use of anticoagulants   ? Osteopenia   ? Osteoporosis   ? femur fracture 2005, pelvic fracture 2006  ? Personal history of malignant neoplasm of breast   ? Pneumonia   ? Syncope   ? Thoracic aortic aneurysm ([place]) 02/2017  ? chronic descending aorta dissection  ? Unspecified closed fracture of pelvis 2006  ? ? ?Past Surgical History:  ?  Procedure Laterality Date  ? CARDIOVERSION N/A 08/09/2012  ? Procedure: CARDIOVERSION;  Surgeon: Deboraha Sprang, MD;  Location: [place];  Service: Cardiovascular;  Laterality: N/A;  ? EYE SURGERY    ? FEMUR SURGERY  2005  ? ORIF  ? INTRAMEDULLARY (IM) NAIL INTERTROCHANTERIC Left 08/02/2021  ? Procedure: INTRAMEDULLARY (IM) NAIL INTERTROCHANTRIC;  Surgeon: Willaim Sheng, MD;  Location: [place];  Service: Orthopedics;  Laterality: Left;  ? IR ANGIOGRAM PELVIS SELECTIVE OR SUPRASELECTIVE  03/11/2017  ? IR ANGIOGRAM SELECTIVE EACH ADDITIONAL VESSEL  03/11/2017  ? IR EMBO ART  VEN HEMORR LYMPH EXTRAV  INC GUIDE ROADMAPPING  03/11/2017  ? IR FLUORO GUIDE CV LINE RIGHT  03/11/2017  ? IR US GUIDE VASC ACCESS RIGHT  03/11/2017  ? IR US GUIDE VASC ACCESS RIGHT  03/11/2017  ? MASTECTOMY  1995  ? left  ? MINOR HARDWARE REMOVAL Left 09/01/2020  ? Procedure:  REMOVAL OF HARDWARE LEFT FOREARM;  Surgeon: Milly Jakob, MD;  Location: [place];  Service: Orthopedics;  Laterality: Left;  ? ORIF ULNAR FRACTURE Left 10/19/2019  ? Procedure: OPEN REDUCTION INTERNAL FIXATION (ORIF)  LEFT DISTAL RADIUS  AND ULNA FRACTURE;  Surgeon: Milly Jakob, MD;  Location: [place];  Service: Orthopedics;  Laterality: Left;  ? REVERSE SHOULDER ARTHROPLASTY Right 05/04/2021  ? Procedure: REVERSE SHOULDER ARTHROPLASTY;  Surgeon: Netta Cedars, MD;  Location: [place];  Service: Orthopedics;  Laterality: Right;  ? SHOULDER SURGERY    ? left  ? [place], 2003  ? TONSILLECTOMY    ? TOTAL ABDOMINAL HYSTERECTOMY W/ BILATERAL SALPINGOOPHORECTOMY  1995  ? WRIST SURGERY    ?  x 2   ? ? ?Prior to Admission medications   ?Medication Sig Start Date End Date Taking? Authorizing Provider  ?BD PEN NEEDLE NANO U/F 32G X 4 MM MISC Inject into the skin daily. 11/25/20  Yes [provider]  ?Calcium Carbonate-Vitamin D 600-400 MG-UNIT tablet Take 1 tablet by mouth daily.   Yes [provider]  ?Cholecalciferol (VITAMIN D) 2000 UNITS tablet Take 1,000 Units by mouth daily.   Yes [provider]  ?diltiazem (CARDIZEM CD) 360 MG 24 hr capsule Take 1 capsule (360 mg total) by mouth daily. 05/12/21  Yes Shelly Coss, MD  ?furosemide (LASIX) 40 MG tablet Take 1 tablet (40 mg total) by mouth daily. 06/27/21  Yes Phillips Grout, MD  ?Magnesium Oxide 250 MG TABS Take 250 mg by mouth daily.   Yes [provider]  ?metFORMIN (GLUMETZA) 500 MG (MOD) 24 hr tablet Take 1,000 mg by mouth every evening.   Yes [provider]  ?metoprolol succinate (TOPROL-XL) 100 MG 24 hr tablet Take 1 tablet (100 mg total) by mouth 2 (two) times daily. Take with or immediately following a meal. 06/26/21 07/26/21 Yes Phillips Grout, MD  ?multivitamin-lutein [facility]) CAPS capsule Take 1 capsule by mouth daily.   Yes [provider]  ?polyethylene glycol  (MIRALAX / GLYCOLAX) 17 g packet Take 17 g by mouth daily. 05/12/21  Yes Shelly Coss, MD  ?potassium chloride (K-DUR) 10 MEQ tablet Take 10 mEq by mouth daily.  03/25/15  Yes [provider]  ?XARELTO 15 MG TABS tablet TAKE 1 TABLET ONCE DAILY WITH SUPPER. ?Patient taking differently: Take 15 mg by mouth daily with supper. 06/22/21  Yes Tara Breeding, MD  ? ? ? ? ?Allergies:   Ambien [zolpidem], Codeine, Codeine sulfate, Dulaglutide, Latex, Morphine sulfate, and Mirtazapine  ? ? ?  ROS:    As stated in the HPI and negative for all other systems. ? ? ? ?PHYSICAL EXAM: ?VS:  BP (!) 94/50 (BP Location: Left Arm, Patient Position: Sitting, Cuff Size: Normal)   Pulse 94   Ht '5\' 6"'$  (1.676 m)   Wt 154 lb (69.9 kg)   BMI 24.86 kg/m?  , BMI Body mass index is 24.86 kg/m?. ?GEN:  No distress ?NECK:  No jugular venous distention at 90 degrees, waveform within normal limits, carotid upstroke brisk and symmetric, no bruits, no thyromegaly ?LYMPHATICS:  No cervical adenopathy ?LUNGS:  Clear to auscultation bilaterally ?BACK:  No CVA tenderness ?CHEST:  Unremarkable ?HEART:  S1 and S2 within normal limits, no S3, no clicks, no rubs, no murmurs, irregular  ?ABD:  Positive bowel sounds normal in frequency in pitch, no bruits, no rebound, no guarding, unable to assess midline mass or bruit with the patient seated. ?EXT:  2 plus pulses throughout, moderate edema, no cyanosis no clubbing ?SKIN:  No rashes no nodules ?NEURO:  Cranial nerves II through XII grossly intact, motor grossly intact throughout ?PSYCH:  Cognitively intact, oriented to person place and time ? ? ?EKG:  EKG is  ordered today. ?Atrial fibrillation, rate 94, poor anterior R wave progression, no acute ST-T wave changes. ? ? ?Recent Labs: ?08/27/2021: TSH 5.651 ?09/29/2021: ALT 9; B Natriuretic Peptide 380.1; BUN 20; Creatinine, Ser 0.70; Hemoglobin 10.5; Magnesium 1.8; Platelets 200; Potassium 3.9; Sodium 137  ? ? ?Lipid Panel ?   ?Component Value Date/Time   ? CHOL 132 03/13/2017 0431  ? TRIG 72 03/13/2017 0431  ? HDL 48 03/13/2017 0431  ? CHOLHDL 2.8 03/13/2017 0431  ? VLDL 14 03/13/2017 0431  ? [place] 70 03/13/2017 0431  ? LDLDIRECT 37 03/10/2009 1106  ? ?  ?W

## 2021-10-02 NOTE — Patient Instructions (Signed)
?  Follow-Up: ?At [facility], you and your health needs are our priority.  As part of our continuing mission to provide you with exceptional heart care, we have created designated Provider Care Teams.  These Care Teams include your primary Cardiologist (physician) and Advanced Practice Providers (APPs -  Physician Assistants and Nurse Practitioners) who all work together to provide you with the care you need, when you need it. ? ?We recommend signing up for the patient portal called "MyChart".  Sign up information is provided on this After Visit Summary.  MyChart is used to connect with patients for Virtual Visits (Telemedicine).  Patients are able to view lab/test results, encounter notes, upcoming appointments, etc.  Non-urgent messages can be sent to your provider as well.   ?To learn more about what you can do with MyChart, go to [email or web address].   ? ?Your next appointment:   ?1 week(s) ? ?The format for your next appointment:   ?In Person ? ?Provider:  Sande Rives PA-C ?{ ? ? ? ?Important Information About Sugar ? ? ? ? ?  ?

## 2021-10-03 LAB — BASIC METABOLIC PANEL
BUN/Creatinine Ratio: 21 (ref 12–28)
BUN: 16 mg/dL (ref 10–36)
CO2: 35 mmol/L — ABNORMAL HIGH (ref 20–29)
Calcium: 9.2 mg/dL (ref 8.7–10.3)
Chloride: 93 mmol/L — ABNORMAL LOW (ref 96–106)
Creatinine, Ser: 0.75 mg/dL (ref 0.57–1.00)
Glucose: 245 mg/dL — ABNORMAL HIGH (ref 70–99)
Potassium: 4.1 mmol/L (ref 3.5–5.2)
Sodium: 140 mmol/L (ref 134–144)
eGFR: 72 mL/min/{1.73_m2} (ref >59)

## 2021-10-05 DIAGNOSIS — S72002A Fracture of unspecified part of neck of left femur, initial encounter for closed fracture: Secondary | ICD-10-CM | POA: Diagnosis not present

## 2021-10-05 DIAGNOSIS — I4891 Unspecified atrial fibrillation: Secondary | ICD-10-CM | POA: Diagnosis not present

## 2021-10-05 DIAGNOSIS — J9610 Chronic respiratory failure, unspecified whether with hypoxia or hypercapnia: Secondary | ICD-10-CM | POA: Diagnosis not present

## 2021-10-05 DIAGNOSIS — I509 Heart failure, unspecified: Secondary | ICD-10-CM | POA: Diagnosis not present

## 2021-10-05 DIAGNOSIS — J9601 Acute respiratory failure with hypoxia: Secondary | ICD-10-CM | POA: Diagnosis not present

## 2021-10-05 DIAGNOSIS — R2681 Unsteadiness on feet: Secondary | ICD-10-CM | POA: Diagnosis not present

## 2021-10-05 DIAGNOSIS — S72142D Displaced intertrochanteric fracture of left femur, subsequent encounter for closed fracture with routine healing: Secondary | ICD-10-CM | POA: Diagnosis not present

## 2021-10-05 DIAGNOSIS — R601 Generalized edema: Secondary | ICD-10-CM | POA: Diagnosis not present

## 2021-10-05 DIAGNOSIS — I5033 Acute on chronic diastolic (congestive) heart failure: Secondary | ICD-10-CM | POA: Diagnosis not present

## 2021-10-05 DIAGNOSIS — R339 Retention of urine, unspecified: Secondary | ICD-10-CM | POA: Diagnosis not present

## 2021-10-05 DIAGNOSIS — E1159 Type 2 diabetes mellitus with other circulatory complications: Secondary | ICD-10-CM | POA: Diagnosis not present

## 2021-10-07 ENCOUNTER — Encounter: Payer: Self-pay | Admitting: Student

## 2021-10-07 ENCOUNTER — Encounter: Payer: Self-pay | Admitting: *Deleted

## 2021-10-07 ENCOUNTER — Ambulatory Visit: Payer: Medicare Other | Admitting: Student

## 2021-10-07 VITALS — BP 124/85 | HR 69 | Ht 66.0 in | Wt 150.0 lb

## 2021-10-07 DIAGNOSIS — J9611 Chronic respiratory failure with hypoxia: Secondary | ICD-10-CM

## 2021-10-07 DIAGNOSIS — I10 Essential (primary) hypertension: Secondary | ICD-10-CM

## 2021-10-07 DIAGNOSIS — I482 Chronic atrial fibrillation, unspecified: Secondary | ICD-10-CM

## 2021-10-07 DIAGNOSIS — I71012 Dissection of descending thoracic aorta: Secondary | ICD-10-CM

## 2021-10-07 DIAGNOSIS — I5032 Chronic diastolic (congestive) heart failure: Secondary | ICD-10-CM | POA: Diagnosis not present

## 2021-10-07 DIAGNOSIS — E785 Hyperlipidemia, unspecified: Secondary | ICD-10-CM

## 2021-10-07 DIAGNOSIS — E118 Type 2 diabetes mellitus with unspecified complications: Secondary | ICD-10-CM

## 2021-10-07 DIAGNOSIS — Z794 Long term (current) use of insulin: Secondary | ICD-10-CM

## 2021-10-07 LAB — BASIC METABOLIC PANEL
BUN/Creatinine Ratio: 20 (ref 12–28)
BUN: 15 mg/dL (ref 10–36)
CO2: 36 mmol/L — ABNORMAL HIGH (ref 20–29)
Calcium: 9.7 mg/dL (ref 8.7–10.3)
Chloride: 91 mmol/L — ABNORMAL LOW (ref 96–106)
Creatinine, Ser: 0.76 mg/dL (ref 0.57–1.00)
Glucose: 116 mg/dL — ABNORMAL HIGH (ref 70–99)
Potassium: 3.9 mmol/L (ref 3.5–5.2)
Sodium: 139 mmol/L (ref 134–144)
eGFR: 71 mL/min/{1.73_m2} (ref >59)

## 2021-10-07 NOTE — Patient Instructions (Addendum)
Medication Instructions:  ?Your physician recommends that you continue on your current medications as directed. Please refer to the Current Medication list given to you today.  ? ?*If you need a refill on your cardiac medications before your next appointment, please call your pharmacy* ? ? ?Lab Work: ?Your physician recommends that you complete labs today ?BMET ? ? ?If you have labs (blood work) drawn today and your tests are completely normal, you will receive your results only by: ?MyChart Message (if you have MyChart) OR ?A paper copy in the mail ?If you have any lab test that is abnormal or we need to change your treatment, we will call you to review the results. ? ? ?Testing/Procedures: ?NONE ordered at this time of appointment  ? ? ? ?Follow-Up: ?At [facility], you and your health needs are our priority.  As part of our continuing mission to provide you with exceptional heart care, we have created designated Provider Care Teams.  These Care Teams include your primary Cardiologist (physician) and Advanced Practice Providers (APPs -  Physician Assistants and Nurse Practitioners) who all work together to provide you with the care you need, when you need it. ? ?We recommend signing up for the patient portal called "MyChart".  Sign up information is provided on this After Visit Summary.  MyChart is used to connect with patients for Virtual Visits (Telemedicine).  Patients are able to view lab/test results, encounter notes, upcoming appointments, etc.  Non-urgent messages can be sent to your provider as well.   ?To learn more about what you can do with MyChart, go to [email or web address].   ? ?Your next appointment:   ?2 week(s) ? ?The format for your next appointment:   ?In Person ? ?Provider:   ?Sande Rives, PA-C      ? ? ?Other Instructions ?Heart Failure Education: ?Weigh yourself EVERY morning after you go to the bathroom but before you eat or drink anything. Write this number down in a weight  log/diary. If you gain 3 pounds overnight or 5 pounds in a week, call the office. ?Take your medicines as prescribed. If you have concerns about your medications, please call us before you stop taking them.  ?Eat low salt foods--Limit salt (sodium) to 2000 mg per day. This will help prevent your body from holding onto fluid. Read food labels as many processed foods have a lot of sodium, especially canned goods and prepackaged meats. If you would like some assistance choosing low sodium foods, we would be happy to set you up with a nutritionist. ?Limit all fluids for the day to less than 2 liters (64 ounces). Fluid includes all drinks, coffee, juice, ice chips, soup, jello, and all other liquids. ?Stay as active as you can everyday. Staying active will give you more energy and make your muscles stronger. Start with 5 minutes at a time and work your way up to 30 minutes a day. Break up your activities--do some in the morning and some in the afternoon. Start with 3 days per week and work your way up to 5 days as you can.  If you have chest pain, feel short of breath, dizzy, or lightheaded, STOP. If you don't feel better after a short rest, call 911. If you do feel better, call the office to let us know you have symptoms with exercise. ? ? ? ?Important Information About Sugar ? ? ? ? ? ? ?

## 2021-10-08 ENCOUNTER — Telehealth: Payer: Self-pay | Admitting: Student

## 2021-10-08 ENCOUNTER — Other Ambulatory Visit: Payer: Self-pay

## 2021-10-08 DIAGNOSIS — Z79899 Other long term (current) drug therapy: Secondary | ICD-10-CM

## 2021-10-08 MED ORDER — ACETAZOLAMIDE 250 MG PO TABS: 250.0000 mg | ORAL_TABLET | Freq: Every day | ORAL | 0 refills | Status: DC

## 2021-10-08 NOTE — Telephone Encounter (Signed)
Patient is returning LPN's call for lab results.

## 2021-10-08 NOTE — Progress Notes (Signed)
Cardiology Office Note:    Date:  10/21/2021   ID:  Tara Bradley, DOB 23-Jan-1923, MRN [id number]  PCP:  Tara Jordan, MD  Cardiologist:  [name], MD  Electrophysiologist:  None   Referring MD: Tara Jordan, MD   Chief Complaint: follow-up of CHF  History of Present Illness:    Tara Bradley is a 88 y.o. female with a history of chronic atrial fibrillation Xarelto, chronic diastolic CHF, aortic aneurysm with chronic descending dissection, aortic valve sclerosis, hypertension, hyperlipidemia, type 2 diabetes mellitus, and recent left hip fracture who is followed by Dr. Percival Spanish and presents today for follow-up of CHF.   I am very familiar with Tara Bradley. She was a very functional 88 year old who was living alone and fully independent prior to suffering a mechanical fall in 07/2021 and fracturing her hip. I saw her during this admission and have seen her multiple times since. She he has had a significant decline in functional status and has struggled with acute CHF since her hip surgery. At her visit with me on 09/02/2021, she was noted to be 30 lbs up from where she was in 06/2021 with significant lower extremity and dyspnea on exertion. She was sent to the ED where she was admitted for acute on chronic diastolic CHF. Echo showed LVEF of 60-65%, moderately enlarged RV with mildly reduced systolic function, biatrial enlargement, moderate TR, moderately elevated PASP of 59.0 mmHg, and a lateral pleural effusion. She was there for almost 2 weeks being diuresed. She also required Diamox for contraction alkalosis with improvement in CO2. I saw her for hospital follow-up on 09/23/2021 at which time she was still having some dyspnea on exertion requiring 3L of O2 (she was not on any O2 prior to her hip surgery) and lower extremity edema but weight was down 23 lbs since prior visit and she looked remarkably better. Since then, she has had another decline. She was seen in the ED on 09/29/2021  for rapid atrial fibrillation and worsening respiratory distress. She was given 2 doses of IV Cardizem in route to the ED with improvement in rates. BNP was elevated at 380 but lower than it was 2 weeks prior. Chest CTA was negative for PE but showed cardiac enlargement, bilateral pleural effusion, and pulmonary edema consistent with CHF. She was given a dose of IV Lasix with some improvement in her breathing. Admission was offered but patient declined. I spoke with Tara Bradley, one of the PA-C, at [place] on 10/01/2021 who expressed concern that patient may need to be readmitted but that patient would be very hesitant of this. She also reported that patient was up about 7 lbs since by visit with her on 09/23/2021. I recommended increasing her Lasix to $Remove'80mg'vEQmqzX$  twice daily and increasing her KCl to 35mEq daily.   Patient was last seen by me on 10/07/2021 at which time she continued to report dyspnea with minimal exertion but weight was down on increased dose of Lasix and she felt like her breathing was slightly better. Dyspnea likely multifactorial at this point with CHF and deconditioning playing a role. Repeat BMET showed stable creatinine but CO2 was increasing again. She was continued on current dose of Lasix and restart on Diamox $RemoveB'250mg'Dchlpvpc$  daily.   Patient was seen by Pulmonology on 10/12/2021 at which time dyspnea and continued hypoxia felt to be due to CHF and ongoing volume overloaded. Chest x-ray in clinic showed persistent left pleural effusion. Thoracentesis was offered but patient preferred to  continue with medical therapy. Further pulmonary testing (PFTs and high resolution CT) was felt to be low yield at that time. However, they did recommend we order repeat high resolution CT when she is more euvolemic.   Patient presents today for follow-up. Here with daugher. She is doing much better since last visit. Her weight is down 11 lbs and she reports her shortness of breath is significantly better. She states she  really doesn't feel short of breath much any more. She is still on 4L 24/7 but this is mainly because she has not tried to wean it down. Pulmonology said she likely did not need O2 at rest anymore. Her O2 sats remained >90 during office visit on room air. Her lower extremity edema is also much better. She denies any orthopnea, PND, chest pain, palpitations, lightheadedness, dizziness, or syncope. We did notice during her visit today that her heart rates increase as her O2 sat drops. When I took her off her O2, O2 sat dropped as low as 92% and heart rate increased to 120s and very briefly 130s. However, O2 sat improved as she took deep breaths and heart rates improved to the 80s. She is unaware of when her heart rates increase and denies any palpitations with this. She has also had some bradycardia in the past which is why her Digoxin was stopped. At my last visit with her, she reported some brief episodes of bradycardia with heart rate as low as the 40s. She states she still has occasional episodes of bradycardia but it does not sound like her heart rates stay there for long. She denies any symptoms of bradycardia. Overall, she is doing much better today.   Past Medical History:  Diagnosis Date   Aortic valve sclerosis    Echo, 2008   Arthritis    Bradycardia    October, 2012   Cancer [facility])    Chronic atrial fibrillation (HCC)    Chronic diastolic CHF (congestive heart failure) ([place])    Chronic insomnia    Closed fracture of unspecified part of femur 2005   Diabetes mellitus, type 2 ([place])    Diverticulitis    Diverticulosis    Hyperlipidemia    Hypertension    Long term (current) use of anticoagulants    Osteopenia    Osteoporosis    femur fracture 2005, pelvic fracture 2006   Personal history of malignant neoplasm of breast    Pneumonia    Syncope    Thoracic aortic aneurysm ([place]) 02/2017   chronic descending aorta dissection   Unspecified closed fracture of pelvis 2006    Past  Surgical History:  Procedure Laterality Date   CARDIOVERSION N/A 08/09/2012   Procedure: CARDIOVERSION;  Surgeon: Deboraha Sprang, MD;  Location: [place];  Service: Cardiovascular;  Laterality: N/A;   EYE SURGERY     FEMUR SURGERY  2005   ORIF   INTRAMEDULLARY (IM) NAIL INTERTROCHANTERIC Left 08/02/2021   Procedure: INTRAMEDULLARY (IM) NAIL INTERTROCHANTRIC;  Surgeon: Willaim Sheng, MD;  Location: [place];  Service: Orthopedics;  Laterality: Left;   IR ANGIOGRAM PELVIS SELECTIVE OR SUPRASELECTIVE  03/11/2017   IR ANGIOGRAM SELECTIVE EACH ADDITIONAL VESSEL  03/11/2017   IR EMBO ART  VEN HEMORR LYMPH EXTRAV  INC GUIDE ROADMAPPING  03/11/2017   IR FLUORO GUIDE CV LINE RIGHT  03/11/2017   IR US GUIDE VASC ACCESS RIGHT  03/11/2017   IR US GUIDE VASC ACCESS RIGHT  03/11/2017   MASTECTOMY  1995   left  MINOR HARDWARE REMOVAL Left 09/01/2020   Procedure: REMOVAL OF HARDWARE LEFT FOREARM;  Surgeon: Milly Jakob, MD;  Location: [place];  Service: Orthopedics;  Laterality: Left;   ORIF ULNAR FRACTURE Left 10/19/2019   Procedure: OPEN REDUCTION INTERNAL FIXATION (ORIF)  LEFT DISTAL RADIUS  AND ULNA FRACTURE;  Surgeon: Milly Jakob, MD;  Location: [place];  Service: Orthopedics;  Laterality: Left;   REVERSE SHOULDER ARTHROPLASTY Right 05/04/2021   Procedure: REVERSE SHOULDER ARTHROPLASTY;  Surgeon: Netta Cedars, MD;  Location: [place];  Service: Orthopedics;  Laterality: Right;   SHOULDER SURGERY     left   SHOULDER SURGERY  1979, 2003   TONSILLECTOMY     TOTAL ABDOMINAL HYSTERECTOMY W/ BILATERAL SALPINGOOPHORECTOMY  1995   WRIST SURGERY      x 2     Current Medications: Current Meds  Medication Sig   acetaZOLAMIDE (DIAMOX) 250 MG tablet Take 1 tablet (250 mg total) by mouth daily.   BD PEN NEEDLE NANO U/F 32G X 4 MM MISC Inject into the skin daily.   Calcium Carbonate-Vitamin D 600-400 MG-UNIT tablet Take 1 tablet by mouth daily.   Cholecalciferol (VITAMIN D) 125  MCG (5000 UT) CAPS Take 5,000 Units by mouth daily.   Dextromethorphan-guaiFENesin 5-100 MG/5ML LIQD Take 10 mLs by mouth 3 (three) times daily.   diltiazem (CARDIZEM CD) 360 MG 24 hr capsule Take 1 capsule (360 mg total) by mouth daily.   eszopiclone (LUNESTA) 2 MG TABS tablet Take 2 mg by mouth at bedtime. Take immediately before bedtime   ferrous gluconate (FERGON) 324 MG tablet Take 324 mg by mouth 2 (two) times daily with a meal.   furosemide (LASIX) 40 MG tablet Take 2 tablets (80 mg total) by mouth 2 (two) times daily for 4 days, THEN 1 tablet (40 mg total) 2 (two) times daily. Take extra 40 mg tablet for weight gain of 3 lbs in 1 day or 5 lbs in 1 week.   gabapentin (NEURONTIN) 100 MG capsule Take 100 mg by mouth 3 (three) times daily.   insulin glargine (LANTUS) 100 UNIT/ML Solostar Pen Inject 8 Units into the skin daily.   insulin lispro (HUMALOG) 100 UNIT/ML injection Inject 0-12 Units into the skin daily. Sliding scale : If blood sugar less than 70, call NP 70-200 = 0 units 201-250 = 2 units 251-300 = 4 units 301-350 = 6 units 351-400 = 8 units 401-450 = 10 units 451-600 = 12 units If greater than 450 give 12 units. Recheck in 2 hours. If >350 or <100 notify provider.   ipratropium-albuterol (DUONEB) 0.5-2.5 (3) MG/3ML SOLN Take 3 mLs by nebulization 2 (two) times daily.   magnesium oxide (MAG-OX) 400 MG tablet Take 400 mg by mouth at bedtime.   metFORMIN (GLUMETZA) 500 MG (MOD) 24 hr tablet Take 1,000 mg by mouth every evening.   metoprolol succinate (TOPROL-XL) 100 MG 24 hr tablet Take 1 tablet (100 mg total) by mouth daily. Take with or immediately following a meal.   metoprolol tartrate (LOPRESSOR) 25 MG tablet Take 25 mg by mouth as needed.   polyethylene glycol (MIRALAX / GLYCOLAX) 17 g packet Take 17 g by mouth daily.   potassium chloride (KLOR-CON) 10 MEQ tablet Take 2 tablets (20 mEq total) by mouth daily.   senna (SENOKOT) 8.6 MG TABS tablet Take 1 tablet (8.6 mg total)  by mouth 2 (two) times daily.   tamsulosin (FLOMAX) 0.4 MG CAPS capsule Take 1 capsule (0.4 mg total) by  mouth daily.   traZODone (DESYREL) 50 MG tablet Take 50 mg by mouth at bedtime as needed for sleep.   white petrolatum (VASELINE) GEL Apply 1 application. topically in the morning, at noon, in the evening, and at bedtime.   XARELTO 15 MG TABS tablet TAKE 1 TABLET ONCE DAILY WITH SUPPER. (Patient taking differently: Take 15 mg by mouth daily with supper.)     Allergies:   Ambien [zolpidem], Codeine, Codeine sulfate, Dulaglutide, Latex, Morphine sulfate, and Mirtazapine   Social History   Socioeconomic History   Marital status: Widowed    Spouse name: Not on file   Number of children: 2   Years of education: Not on file   Highest education level: Not on file  Occupational History   Occupation: English as a second language teacher, retired  Tobacco Use   Smoking status: Never   Smokeless tobacco: Never  Vaping Use   Vaping Use: Never used  Substance and Sexual Activity   Alcohol use: No   Drug use: No   Sexual activity: Not Currently  Other Topics Concern   Not on file  Social History Narrative   Husband had advancing Dementia, which was been very difficult for her. He died 12-17-16.   She was scheduled to go into [facility] 11/02.   Social Determinants of Health   Financial Resource Strain: Not on file  Food Insecurity: No Food Insecurity   Worried About Charity fundraiser in the Last Year: Never true   Ran Out of Food in the Last Year: Never true  Transportation Needs: No Transportation Needs   Lack of Transportation (Medical): No   Lack of Transportation (Non-Medical): No  Physical Activity: Not on file  Stress: Not on file  Social Connections: Not on file     Family History: The patient's family history includes Congestive Heart Failure (age of onset: 81) in her mother; Diabetes type II in her brother, father, and son; Heart attack (age of onset: 70) in her father;  Multiple myeloma in her brother. There is no history of Colon cancer.  ROS:   Please see the history of present illness.     EKGs/Labs/Other Studies Reviewed:    The following studies were reviewed today:  Limited Echo 09/04/2021: Impressions:  1. Left ventricular ejection fraction, by estimation, is 60 to 65%. The  left ventricle has normal function. The left ventricle has no regional  wall motion abnormalities. Left ventricular diastolic function could not  be evaluated.   2. Right ventricular systolic function is mildly reduced. The right  ventricular size is moderately enlarged. There is moderately elevated  pulmonary artery systolic pressure. The estimated right ventricular  systolic pressure is 93.7 mmHg.   3. Left atrial size was moderately dilated.   4. Right atrial size was severely dilated.   5. Large pleural effusion in the left lateral region.   6. Tricuspid valve regurgitation is moderate.   7. The inferior vena cava is normal in size with greater than 50%  respiratory variability, suggesting right atrial pressure of 3 mmHg.  EKG:  EKG not ordered today.   Recent Labs: 08/27/2021: TSH 5.651 09/29/2021: ALT 9; B Natriuretic Peptide 380.1; Hemoglobin 10.5; Magnesium 1.8; Platelets 200 10/07/2021: BUN 15; Creatinine, Ser 0.76; Potassium 3.9; Sodium 139  Recent Lipid Panel    Component Value Date/Time   CHOL 132 03/13/2017 0431   TRIG 72 03/13/2017 0431   HDL 48 03/13/2017 0431   CHOLHDL 2.8 03/13/2017 0431   VLDL 14  03/13/2017 0431   LDLCALC 70 03/13/2017 0431   LDLDIRECT 37 03/10/2009 1106    Physical Exam:    Vital Signs: BP 107/62   Pulse (!) 108   Ht $R'5\' 6"'BL$  (1.676 m)   Wt 139 lb (63 kg)   SpO2 100%   BMI 22.44 kg/m     Wt Readings from Last 3 Encounters:  10/21/21 139 lb (63 kg)  10/12/21 141 lb 9.6 oz (64.2 kg)  10/07/21 150 lb (68 kg)     General: 88 y.o. Caucasian female in no acute distress. HEENT: Normocephalic and atraumatic. Sclera clear.   Neck: Supple. No JVD. Heart: Irregularly irregular rhythm with normal rate. Distinct S1 and S2. No murmurs, gallops, or rubs.  Lungs: No increased work of breathing. Decreased breath sound in right base. Otherwise, lung clear to ausculation.  Abdomen: Soft, non-distended, and non-tender to palpation. quadrants.  Extremities: Mild lower extremity of left lower extremity (much improved from last visit).   Skin: Warm and dry. Neuro: Alert and oriented x3. No focal deficits. Psych: Normal affect. Responds appropriately.  Assessment:    1. Chronic diastolic CHF (congestive heart failure) ([place])   2. Chronic respiratory failure with hypoxia (HCC)   3. Pleural effusion on left   4. Permanent atrial fibrillation ([place])   5. Chronic descending thoracic aortic dissection ([place])   6. Primary hypertension   7. Hyperlipidemia, unspecified hyperlipidemia type   8. Type 2 diabetes mellitus with complication, with long-term current use of insulin (HCC)     Plan:    Chronic Diastolic CHF Patient has struggled with acute on chronic CHF for the last 2 months after hip fracture. She has had multiple ED visits for this as well as a hospitalization last month. Echo on 09/04/2021 showed Echo showed LVEF of 60-65% with normal wall motion, moderately enlarged RV with mildly reduced systolic function, biatrial enlargement, moderate TR, and left pleural effusion.  - Volume status much better compared to last visit. Weight down 11 lbs and lower extremity edema greatly improved.  - Continue Lasix $RemoveBefore'80mg'ekdWRMtQLvEQk$  twice daily. Also on Diamox $RemoveB'250mg'GULNogZR$  given contraction alkalosis.  - Continue daily weights and emphasized the importance of letting us know if weight start to trend up. Recommend continue use of compression stockings. - Will check BMET today.   Chronic Hypoxic Respiratory Failure  Left Pleural Effusion Patient has required supplemental O2 since hip surgery in 07/2021. Currently on 4L 24/7. High-resolution CT during  recent admission in 08/2021 showed evidence of volume overload but it was difficult to identify any definitive findings suggestive of chronic lung disease during acute illness. She was seen by Pulmonology on earlier this month who felt dyspnea and continued hypoxia felt to be due to CHF and ongoing volume overloaded. Chest x-ray during that visit showed persistent left pleural effusion.  - Dyspnea and volume status much improved. - Pulmonology offered thoracentesis but patient declined. - Pulmonology did not feel like PFTs were necessary at this time. - Per Pulmonology, O2 sat goal > 88%. O2 sats remained >90% during office visit on room air. Therefore, advised patient that she does not need to use O2 at rest. Discussed that she will likely still need this with activity but can try to wean O2 down to 2-3L. Also recommended continuing to use O2 at night for now. - Pulmonology recommended repeat CT scan once heart failure resolves. I think patient is euvolemic so discussed reordering this today. However, patient and daughter would like to wait until next visit. -  Follow-up with Pulmonology as recommended.   Chronic Atrial Fibrillation Rates mostly well controlled during visit but would increase as O2 sat decreased. - Continue Cardizem CD 360mg  daily. - Continue Toprol-XL 100mg  daily.  - Digoxin stopped during recent admission due to bradycardia and pauses. -  CHA2DS2-VASc = 7 (CHF, HTN, DM, vascular disease, female, age x2). Continue chronic anticoagulation with Xarelto 15mg  daily (reduced dose due to CrCl <51).  - Asked patient to keep BP/HR log for 2 weeks and to notify us if HR sustaining >120 bpm at rest. She was previously on Toprol-XL twice daily. We may need to go back to this dose. However, will wait for BP/HR log given soft BP at times and occasional episodes of bradycardia with rates as low as the 40s. May need to consider a 3 day Zio monitor in the future to help determine help well her rate is  controlled.   Aortic Aneurysm with Chronic Descending Dissection Last CTA in 2018 showed progressive predominantly thrombosed descending aortic dissection with an associated branch artery pseudoaneurysm.  - She has been seen by CT Surgery for this and has previously stated she would not want any therapies for this given her advanced age. Therefore, no further imaging felt to be needed.   Hypertension BP well controlled. - Continue Cardizem and Toprol-XL as above.   Hyperlipidemia Recent lipid panel from 06/2021: Total Cholesterol 170, Triglycerides 80, HDL 66, LDL 89.  - Not on any medications.  - Given advanced age, don't think there is much utility in starting a statin.    Type 2 Diabetes Mellitus Hemoglobin A1c 8.6 in 04/2021. - Management per PCP.  Disposition: Patient already has a follow-up in 12/2021 with Dr. Percival Spanish.   Medication Adjustments/Labs and Tests Ordered: Current medicines are reviewed at length with the patient today.  Concerns regarding medicines are outlined above.  Orders Placed This Encounter  Procedures   Basic metabolic panel   No orders of the defined types were placed in this encounter.   Patient Instructions  Medication Instructions:  No Changes *If you need a refill on your cardiac medications before your next appointment, please call your pharmacy*   Lab Work: BMET Today If you have labs (blood work) drawn today and your tests are completely normal, you will receive your results only by: [place] (if you have MyChart) OR A paper copy in the mail If you have any lab test that is abnormal or we need to change your treatment, we will call you to review the results.   Testing/Procedures: No Testing   Follow-Up: At [facility], you and your health needs are our priority.  As part of our continuing mission to provide you with exceptional heart care, we have created designated Provider Care Teams.  These Care Teams include your primary  Cardiologist (physician) and Advanced Practice Providers (APPs -  Physician Assistants and Nurse Practitioners) who all work together to provide you with the care you need, when you need it.  We recommend signing up for the patient portal called "MyChart".  Sign up information is provided on this After Visit Summary.  MyChart is used to connect with patients for Virtual Visits (Telemedicine).  Patients are able to view lab/test results, encounter notes, upcoming appointments, etc.  Non-urgent messages can be sent to your provider as well.   To learn more about what you can do with MyChart, go to [email or web address].    Your next appointment:   Keep Scheduled Appointment  The format for  your next appointment:   In Person  Provider:   [name], MD     Other Instructions Try weaning off oxygen, Wean down to 2-3 liters while resting.  Important Information About Sugar         Signed, Eppie Gibson  10/21/2021 10:09 PM    [facility]

## 2021-10-08 NOTE — Telephone Encounter (Signed)
Spoke with nurse- documentation in notes.  Thanks!

## 2021-10-09 DIAGNOSIS — E1159 Type 2 diabetes mellitus with other circulatory complications: Secondary | ICD-10-CM | POA: Diagnosis not present

## 2021-10-09 DIAGNOSIS — R499 Unspecified voice and resonance disorder: Secondary | ICD-10-CM | POA: Diagnosis not present

## 2021-10-09 DIAGNOSIS — Z9981 Dependence on supplemental oxygen: Secondary | ICD-10-CM | POA: Diagnosis not present

## 2021-10-09 DIAGNOSIS — Z9012 Acquired absence of left breast and nipple: Secondary | ICD-10-CM | POA: Diagnosis not present

## 2021-10-09 DIAGNOSIS — I5032 Chronic diastolic (congestive) heart failure: Secondary | ICD-10-CM | POA: Diagnosis not present

## 2021-10-09 DIAGNOSIS — Z853 Personal history of malignant neoplasm of breast: Secondary | ICD-10-CM | POA: Diagnosis not present

## 2021-10-09 DIAGNOSIS — Z7984 Long term (current) use of oral hypoglycemic drugs: Secondary | ICD-10-CM | POA: Diagnosis not present

## 2021-10-09 DIAGNOSIS — Z794 Long term (current) use of insulin: Secondary | ICD-10-CM | POA: Diagnosis not present

## 2021-10-09 DIAGNOSIS — K5730 Diverticulosis of large intestine without perforation or abscess without bleeding: Secondary | ICD-10-CM | POA: Diagnosis not present

## 2021-10-09 DIAGNOSIS — G479 Sleep disorder, unspecified: Secondary | ICD-10-CM | POA: Diagnosis not present

## 2021-10-09 DIAGNOSIS — M6281 Muscle weakness (generalized): Secondary | ICD-10-CM | POA: Diagnosis not present

## 2021-10-09 DIAGNOSIS — M4186 Other forms of scoliosis, lumbar region: Secondary | ICD-10-CM | POA: Diagnosis not present

## 2021-10-09 DIAGNOSIS — Z8781 Personal history of (healed) traumatic fracture: Secondary | ICD-10-CM | POA: Diagnosis not present

## 2021-10-09 DIAGNOSIS — Z7901 Long term (current) use of anticoagulants: Secondary | ICD-10-CM | POA: Diagnosis not present

## 2021-10-09 DIAGNOSIS — I358 Other nonrheumatic aortic valve disorders: Secondary | ICD-10-CM | POA: Diagnosis not present

## 2021-10-09 DIAGNOSIS — I4891 Unspecified atrial fibrillation: Secondary | ICD-10-CM | POA: Diagnosis not present

## 2021-10-09 DIAGNOSIS — I714 Abdominal aortic aneurysm, without rupture, unspecified: Secondary | ICD-10-CM | POA: Diagnosis not present

## 2021-10-09 DIAGNOSIS — I152 Hypertension secondary to endocrine disorders: Secondary | ICD-10-CM | POA: Diagnosis not present

## 2021-10-09 DIAGNOSIS — Z9181 History of falling: Secondary | ICD-10-CM | POA: Diagnosis not present

## 2021-10-09 DIAGNOSIS — E785 Hyperlipidemia, unspecified: Secondary | ICD-10-CM | POA: Diagnosis not present

## 2021-10-09 DIAGNOSIS — M1990 Unspecified osteoarthritis, unspecified site: Secondary | ICD-10-CM | POA: Diagnosis not present

## 2021-10-09 DIAGNOSIS — J9610 Chronic respiratory failure, unspecified whether with hypoxia or hypercapnia: Secondary | ICD-10-CM | POA: Diagnosis not present

## 2021-10-12 ENCOUNTER — Encounter: Payer: Self-pay | Admitting: Pulmonary Disease

## 2021-10-12 ENCOUNTER — Ambulatory Visit (INDEPENDENT_AMBULATORY_CARE_PROVIDER_SITE_OTHER): Payer: Medicare Other

## 2021-10-12 ENCOUNTER — Ambulatory Visit: Payer: Medicare Other | Admitting: Pulmonary Disease

## 2021-10-12 VITALS — BP 116/66 | HR 126 | Temp 98.2°F | Ht 66.0 in | Wt 141.6 lb

## 2021-10-12 DIAGNOSIS — J9611 Chronic respiratory failure with hypoxia: Secondary | ICD-10-CM | POA: Insufficient documentation

## 2021-10-12 DIAGNOSIS — J90 Pleural effusion, not elsewhere classified: Secondary | ICD-10-CM

## 2021-10-12 DIAGNOSIS — J811 Chronic pulmonary edema: Secondary | ICD-10-CM | POA: Diagnosis not present

## 2021-10-12 DIAGNOSIS — R0902 Hypoxemia: Secondary | ICD-10-CM | POA: Diagnosis not present

## 2021-10-12 DIAGNOSIS — J9621 Acute and chronic respiratory failure with hypoxia: Secondary | ICD-10-CM | POA: Insufficient documentation

## 2021-10-12 DIAGNOSIS — J9811 Atelectasis: Secondary | ICD-10-CM | POA: Diagnosis not present

## 2021-10-12 NOTE — Progress Notes (Signed)
Subjective:   PATIENT ID: Tara Bradley GENDER: female DOB: 1922-10-27, MRN: [id number]   HPI  Chief Complaint  Patient presents with   Consult    Referred b/c suspected lung disease. No complaints of SOB, coughing, wheezing    Reason for Visit: New consult for concern for underlying lung disease  [name] is a 88 year old female never smoker with chronic diastolic heart disease, atrial fibrillation, aortic aneurysm with descending dissection, aortic valve scelrosis, HTN, HLD, DM2 and recent left hip fracture who presents as a new consult for lung disease. Presents with caregiver and daughter.  She has recently had heart failure exacerbation requiring aggressive diuresis since her hip surgery. Currently she reports her heart failure is under better control and denies any shortness of breath, cough or wheezing. Improved leg swelling. Her daughter reports that she use to struggle while getting dressed and this has improved. Her dyspnea worsens when off oxygen. She was seen by Cardiology this month with Dr. Percival Spanish and due to her new O2 requirement since hip surgery (07/2021) she was referred to Pulmonary to rule out chronic pulmonary disease. She has completed CT Chest and CTA which demonstrated findings consistent with heart failure.  I have personally reviewed patient's past medical/family/social history, allergies, current medications.  Past Medical History:  Diagnosis Date   Aortic valve sclerosis    Echo, 2008   Arthritis    Bradycardia    October, 2012   Cancer [facility])    Chronic atrial fibrillation (HCC)    Chronic diastolic CHF (congestive heart failure) (HCC)    Chronic insomnia    Closed fracture of unspecified part of femur 2005   Diabetes mellitus, type 2 ([place])    Diverticulitis    Diverticulosis    Hyperlipidemia    Hypertension    Long term (current) use of anticoagulants    Osteopenia    Osteoporosis    femur fracture 2005, pelvic fracture 2006    Personal history of malignant neoplasm of breast    Pneumonia    Syncope    Thoracic aortic aneurysm ([place]) 02/2017   chronic descending aorta dissection   Unspecified closed fracture of pelvis 2006     Family History  Problem Relation Age of Onset   Congestive Heart Failure Mother 22   Heart attack Father 75   Diabetes type II Father    Diabetes type II Brother        1/2   Multiple myeloma Brother        2/2   Diabetes type II Son    Colon cancer Neg Hx      Social History   Occupational History   Occupation: English as a second language teacher, retired  Tobacco Use   Smoking status: Never   Smokeless tobacco: Never  Scientific laboratory technician Use: Never used  Substance and Sexual Activity   Alcohol use: No   Drug use: No   Sexual activity: Not Currently    Allergies  Allergen Reactions   Ambien [Zolpidem] Other (See Comments)    hallucinations   Codeine Other (See Comments)    headache    Codeine Sulfate Other (See Comments)    REACTION: unspecified   Dulaglutide Diarrhea   Latex Hives   Morphine Sulfate Nausea And Vomiting   Mirtazapine Palpitations     Outpatient Medications Prior to Visit  Medication Sig Dispense Refill   acetaZOLAMIDE (DIAMOX) 250 MG tablet Take 1 tablet (250 mg total) by mouth daily. [place]  tablet 0   BD PEN NEEDLE NANO U/F 32G X 4 MM MISC Inject into the skin daily.     Calcium Carbonate-Vitamin D 600-400 MG-UNIT tablet Take 1 tablet by mouth daily.     Cholecalciferol (VITAMIN D) 125 MCG (5000 UT) CAPS Take 5,000 Units by mouth daily.     Dextromethorphan-guaiFENesin 5-100 MG/5ML LIQD Take 10 mLs by mouth 3 (three) times daily.     diltiazem (CARDIZEM CD) 360 MG 24 hr capsule Take 1 capsule (360 mg total) by mouth daily.     eszopiclone (LUNESTA) 2 MG TABS tablet Take 2 mg by mouth at bedtime. Take immediately before bedtime     ferrous gluconate (FERGON) 324 MG tablet Take 324 mg by mouth 2 (two) times daily with a meal.     furosemide (LASIX) 40 MG tablet Take  2 tablets (80 mg total) by mouth 2 (two) times daily for 4 days, THEN 1 tablet (40 mg total) 2 (two) times daily. Take extra 40 mg tablet for weight gain of 3 lbs in 1 day or 5 lbs in 1 week. 60 tablet 0   gabapentin (NEURONTIN) 100 MG capsule Take 100 mg by mouth 3 (three) times daily.     insulin glargine (LANTUS) 100 UNIT/ML Solostar Pen Inject 8 Units into the skin daily.     insulin lispro (HUMALOG) 100 UNIT/ML injection Inject 0-12 Units into the skin daily. Sliding scale : If blood sugar less than 70, call NP 70-200 = 0 units 201-250 = 2 units 251-300 = 4 units 301-350 = 6 units 351-400 = 8 units 401-450 = 10 units 451-600 = 12 units If greater than 450 give 12 units. Recheck in 2 hours. If >350 or <100 notify provider.     ipratropium-albuterol (DUONEB) 0.5-2.5 (3) MG/3ML SOLN Take 3 mLs by nebulization 2 (two) times daily.     magnesium oxide (MAG-OX) 400 MG tablet Take 400 mg by mouth at bedtime.     metFORMIN (GLUMETZA) 500 MG (MOD) 24 hr tablet Take 1,000 mg by mouth every evening.     metoprolol succinate (TOPROL-XL) 100 MG 24 hr tablet Take 1 tablet (100 mg total) by mouth daily. Take with or immediately following a meal. 30 tablet 0   metoprolol tartrate (LOPRESSOR) 25 MG tablet Take 25 mg by mouth as needed.     polyethylene glycol (MIRALAX / GLYCOLAX) 17 g packet Take 17 g by mouth daily. 14 each 0   potassium chloride (KLOR-CON) 10 MEQ tablet Take 2 tablets (20 mEq total) by mouth daily. 30 tablet 0   senna (SENOKOT) 8.6 MG TABS tablet Take 1 tablet (8.6 mg total) by mouth 2 (two) times daily. 120 tablet 0   tamsulosin (FLOMAX) 0.4 MG CAPS capsule Take 1 capsule (0.4 mg total) by mouth daily. 30 capsule    traZODone (DESYREL) 50 MG tablet Take 50 mg by mouth at bedtime as needed for sleep.     white petrolatum (VASELINE) GEL Apply 1 application. topically in the morning, at noon, in the evening, and at bedtime.     XARELTO 15 MG TABS tablet TAKE 1 TABLET ONCE DAILY WITH  SUPPER. (Patient taking differently: Take 15 mg by mouth daily with supper.) 90 tablet 3   No facility-administered medications prior to visit.    Review of Systems  Constitutional:  Positive for malaise/fatigue. Negative for chills, diaphoresis, fever and weight loss.  HENT:  Negative for congestion.   Respiratory:  Positive for shortness of breath. Negative  for cough, hemoptysis, sputum production and wheezing.   Cardiovascular:  Positive for palpitations and leg swelling. Negative for chest pain.    Objective:   Vitals:   10/12/21 1144  BP: 116/66  Pulse: (!) 126  Temp: 98.2 F (36.8 C)  TempSrc: Oral  SpO2: 100%  Weight: 141 lb 9.6 oz (64.2 kg)  Height: _0  (1.676 m)   SpO2: 100 % (4L)  Physical Exam: General: Elderly and frail-appearing, no acute distress HENT: [place], AT, OP clear, MMM Eyes: EOMI, no scleral icterus Respiratory: Diminished but clear bilaterally.  No crackles, wheezing or rales Cardiovascular: RRR, -M/R/G, no JVD Extremities:-Edema,-tenderness Neuro: AAO x4, CNII-XII grossly intact Skin: Intact, no rashes or bruising Psych: Normal mood, normal affect  Data Reviewed:  Imaging: CT Chest HR 09/13/21 - Moderate bilateral pleural effusions with atelectasis. Ground glass airspace opacities in upper lungs.  CTA 09/29/21 - No pulmonary embolism. Bilateral pleural effusions and pulmonary edema. Cardiomegaly  PFT: None on file  Labs: CBC    Component Value Date/Time   WBC 6.7 09/29/2021 1104   RBC 3.32 (L) 09/29/2021 1104   HGB 10.5 (L) 09/29/2021 1107   HGB 9.1 (L) 08/18/2021 1551   HCT 31.0 (L) 09/29/2021 1107   HCT 28.8 (L) 08/18/2021 1551   PLT 200 09/29/2021 1104   PLT 370 08/18/2021 1551   MCV 96.4 09/29/2021 1104   MCV 94 08/18/2021 1551   MCH 29.8 09/29/2021 1104   MCHC 30.9 09/29/2021 1104   RDW 15.6 (H) 09/29/2021 1104   RDW 16.0 (H) 08/18/2021 1551   LYMPHSABS 0.5 (L) 09/29/2021 1104   MONOABS 0.6 09/29/2021 1104   EOSABS 0.2  09/29/2021 1104   BASOSABS 0.1 09/29/2021 1104        Assessment & Plan:   Discussion: 88 year old female never smoker with chronic diastolic heart disease, atrial fibrillation, aortic aneurysm with descending dissection, aortic valve scelrosis, HTN, HLD, DM2 and recent left hip fracture who presents as a new consult for lung disease. She has recently had heart failure exacerbation requiring aggressive diuresis since her hip surgery. CT chest reviewed with evidence of volume overload. Difficult to identify and definitive findings suggestive of chronic lung disease during acute illness. Currently euvolemic however CXR in-clinic with left pleural effusion. Further pulmonary testing such as PFTs and repeat HRCT would be low yield at this point. Suspect her O2 requirement continues to be related to volume overload from heart failure. Discussed risks and benefits of thoracentesis to assist with pleural effusion however after discussion patient preferred to be medically managed by her cardiologist. In-clinic ambulatory O2 did demonstrate that she does not need oxygen at rest but does require 4L with activity and sleep, which is improved compared to her requirements in the past.  Acute hypoxemic respiratory failure secondary to pleural effusions, volume overload Goal O2 >88% CONTINUE supplemental oxygen for 4L with activity and sleep Hold on pulmonary function tests Dr. Loanne Drilling will send a message to cardiologist to order CT chest without contrast when heart failure resolves Offered thoracentesis however declined today. Will let us know if she would like this in the future   Health Maintenance Immunization History  Administered Date(s) Administered   Fluad Quad(high Dose 65+) 03/09/2019   Influenza Split 03/19/2011, 03/27/2012   Influenza Whole 04/07/2007, 02/23/2008, 02/26/2009   Influenza-Unspecified 02/21/2013   Tdap 10/19/2019, 09/26/2020   CT Lung Screen - not qualified. Never  smoker  Orders Placed This Encounter  Procedures   DG Chest 2 View  Standing Status:   Future    Standing Expiration Date:   10/13/2022    Order Specific Question:   Reason for Exam (SYMPTOM  OR DIAGNOSIS REQUIRED)    Answer:   hypoxemia    Order Specific Question:   Preferred imaging location?    Answer:   Internal    Order Specific Question:   Release to patient    Answer:   Immediate  No orders of the defined types were placed in this encounter.   Return in about 3 months (around 01/12/2022).  I have spent a total time of 45-minutes on the day of the appointment reviewing prior documentation, coordinating care and discussing medical diagnosis and plan with the patient/family. Imaging, labs and tests included in this note have been reviewed and interpreted independently by me.  [place], MD [facility] 10/12/2021 12:17 PM  Office Number [phone]

## 2021-10-12 NOTE — Patient Instructions (Addendum)
Acute hypoxemic respiratory failure secondary to pleural effusions, volume overload Goal O2 >88% CONTINUE supplemental oxygen for 4L with activity and sleep Hold on pulmonary function tests Dr. Loanne Drilling will send a message to cardiologist to order CT chest when heart failure resolves Offered thoracentesis however declined today. Will let us know if she would like this in the future  Follow-up with me 3 months

## 2021-10-13 DIAGNOSIS — Z96611 Presence of right artificial shoulder joint: Secondary | ICD-10-CM | POA: Diagnosis not present

## 2021-10-13 DIAGNOSIS — R54 Age-related physical debility: Secondary | ICD-10-CM | POA: Diagnosis not present

## 2021-10-13 DIAGNOSIS — I7101 Dissection of ascending aorta: Secondary | ICD-10-CM | POA: Diagnosis not present

## 2021-10-13 DIAGNOSIS — S72142S Displaced intertrochanteric fracture of left femur, sequela: Secondary | ICD-10-CM | POA: Diagnosis not present

## 2021-10-13 DIAGNOSIS — I509 Heart failure, unspecified: Secondary | ICD-10-CM | POA: Diagnosis not present

## 2021-10-13 DIAGNOSIS — E1140 Type 2 diabetes mellitus with diabetic neuropathy, unspecified: Secondary | ICD-10-CM | POA: Diagnosis not present

## 2021-10-13 DIAGNOSIS — N1831 Chronic kidney disease, stage 3a: Secondary | ICD-10-CM | POA: Diagnosis not present

## 2021-10-13 DIAGNOSIS — E1122 Type 2 diabetes mellitus with diabetic chronic kidney disease: Secondary | ICD-10-CM | POA: Diagnosis not present

## 2021-10-13 DIAGNOSIS — I5043 Acute on chronic combined systolic (congestive) and diastolic (congestive) heart failure: Secondary | ICD-10-CM | POA: Diagnosis not present

## 2021-10-14 ENCOUNTER — Encounter: Payer: Self-pay | Admitting: Podiatry

## 2021-10-14 ENCOUNTER — Ambulatory Visit: Payer: Medicare Other | Admitting: Podiatry

## 2021-10-14 DIAGNOSIS — M79674 Pain in right toe(s): Secondary | ICD-10-CM

## 2021-10-14 DIAGNOSIS — B351 Tinea unguium: Secondary | ICD-10-CM | POA: Diagnosis not present

## 2021-10-14 DIAGNOSIS — E118 Type 2 diabetes mellitus with unspecified complications: Secondary | ICD-10-CM

## 2021-10-14 DIAGNOSIS — D689 Coagulation defect, unspecified: Secondary | ICD-10-CM

## 2021-10-14 DIAGNOSIS — M79675 Pain in left toe(s): Secondary | ICD-10-CM

## 2021-10-14 NOTE — Progress Notes (Signed)
This patient returns to my office for at risk foot care.  This patient requires this care by a professional since this patient will be at risk due to having coagulation defect and diabetes.  Patient is taking eliquis.   This patient is unable to cut nails herself since the patient cannot reach her nails.These nails are painful walking and wearing shoes. She presents to the office with her daughter and has shoulder and left hip surgery. This patient presents for at risk foot care today.  General Appearance  Alert, conversant and in no acute stress.  Vascular  Dorsalis pedis and posterior tibial  pulses are palpable  bilaterally.  Capillary return is within normal limits  bilaterally. Temperature is within normal limits  bilaterally.  Neurologic  Senn-Weinstein monofilament wire test within normal limits  bilaterally. Muscle power within normal limits bilaterally.  Nails Thick disfigured discolored nails with subungual debris  from hallux to fifth toes bilaterally. No evidence of bacterial infection or drainage bilaterally.  Orthopedic  No limitations of motion  feet .  No crepitus or effusions noted.  No bony pathology or digital deformities noted. Hammer toes  B/L.  Skin  normotropic skin with no porokeratosis noted bilaterally.  No signs of infections or ulcers noted.   Contusion noted 2nd  MPJ right foot.  Minimal swelling.  Onychomycosis  Pain in right toes  Pain in left toes  Consent was obtained for treatment procedures.   Mechanical debridement of nails 1-5  bilaterally performed with a nail nipper.  Filed with dremel without incident.    Return office visit    3 months                  Told patient to return for periodic foot care and evaluation due to potential at risk complications.   Kamaria Lucia DPM  

## 2021-10-15 ENCOUNTER — Ambulatory Visit: Payer: Medicare Other | Admitting: Physician Assistant

## 2021-10-15 ENCOUNTER — Other Ambulatory Visit: Payer: Self-pay | Admitting: *Deleted

## 2021-10-15 ENCOUNTER — Ambulatory Visit: Payer: Medicare Other | Admitting: Student

## 2021-10-15 DIAGNOSIS — I714 Abdominal aortic aneurysm, without rupture, unspecified: Secondary | ICD-10-CM | POA: Diagnosis not present

## 2021-10-15 DIAGNOSIS — I4891 Unspecified atrial fibrillation: Secondary | ICD-10-CM | POA: Diagnosis not present

## 2021-10-15 DIAGNOSIS — Z9981 Dependence on supplemental oxygen: Secondary | ICD-10-CM | POA: Diagnosis not present

## 2021-10-15 DIAGNOSIS — I5032 Chronic diastolic (congestive) heart failure: Secondary | ICD-10-CM

## 2021-10-15 DIAGNOSIS — Z9181 History of falling: Secondary | ICD-10-CM | POA: Diagnosis not present

## 2021-10-15 DIAGNOSIS — R499 Unspecified voice and resonance disorder: Secondary | ICD-10-CM | POA: Diagnosis not present

## 2021-10-15 DIAGNOSIS — Z7984 Long term (current) use of oral hypoglycemic drugs: Secondary | ICD-10-CM | POA: Diagnosis not present

## 2021-10-15 DIAGNOSIS — K5730 Diverticulosis of large intestine without perforation or abscess without bleeding: Secondary | ICD-10-CM | POA: Diagnosis not present

## 2021-10-15 DIAGNOSIS — G479 Sleep disorder, unspecified: Secondary | ICD-10-CM | POA: Diagnosis not present

## 2021-10-15 DIAGNOSIS — Z794 Long term (current) use of insulin: Secondary | ICD-10-CM | POA: Diagnosis not present

## 2021-10-15 DIAGNOSIS — J9610 Chronic respiratory failure, unspecified whether with hypoxia or hypercapnia: Secondary | ICD-10-CM | POA: Diagnosis not present

## 2021-10-15 DIAGNOSIS — E785 Hyperlipidemia, unspecified: Secondary | ICD-10-CM | POA: Diagnosis not present

## 2021-10-15 DIAGNOSIS — M4186 Other forms of scoliosis, lumbar region: Secondary | ICD-10-CM | POA: Diagnosis not present

## 2021-10-15 DIAGNOSIS — M810 Age-related osteoporosis without current pathological fracture: Secondary | ICD-10-CM | POA: Diagnosis not present

## 2021-10-15 DIAGNOSIS — Z7901 Long term (current) use of anticoagulants: Secondary | ICD-10-CM | POA: Diagnosis not present

## 2021-10-15 DIAGNOSIS — Z853 Personal history of malignant neoplasm of breast: Secondary | ICD-10-CM | POA: Diagnosis not present

## 2021-10-15 DIAGNOSIS — Z9012 Acquired absence of left breast and nipple: Secondary | ICD-10-CM | POA: Diagnosis not present

## 2021-10-15 DIAGNOSIS — M6281 Muscle weakness (generalized): Secondary | ICD-10-CM | POA: Diagnosis not present

## 2021-10-15 DIAGNOSIS — E119 Type 2 diabetes mellitus without complications: Secondary | ICD-10-CM | POA: Diagnosis not present

## 2021-10-15 DIAGNOSIS — E1159 Type 2 diabetes mellitus with other circulatory complications: Secondary | ICD-10-CM | POA: Diagnosis not present

## 2021-10-15 DIAGNOSIS — M1990 Unspecified osteoarthritis, unspecified site: Secondary | ICD-10-CM | POA: Diagnosis not present

## 2021-10-15 DIAGNOSIS — I152 Hypertension secondary to endocrine disorders: Secondary | ICD-10-CM | POA: Diagnosis not present

## 2021-10-15 DIAGNOSIS — Z8781 Personal history of (healed) traumatic fracture: Secondary | ICD-10-CM | POA: Diagnosis not present

## 2021-10-15 DIAGNOSIS — I358 Other nonrheumatic aortic valve disorders: Secondary | ICD-10-CM | POA: Diagnosis not present

## 2021-10-15 NOTE — Patient Outreach (Signed)
[facility]) Care Management  10/15/2021  Tara Bradley 01-28-1923 [id number]   Received hospital referral from [facility], RN as patient previously agreed to [facility] CM follow up. Assigned patient to Valente David, RN care coordinator for follow up.  [place] Management Assistant [phone]

## 2021-10-15 NOTE — Patient Outreach (Signed)
[facility]) Care Management  10/15/2021  SURIYA KOVARIK 16-Oct-1922 [id number]   Referral Date: 5/25 Referral Source: Post acute care coordinator Date of Admission: 4/26 from [facility] Diagnosis: CHF Date of Discharge: 5/15 Facility: [place]: [place] attempt #1, successful to daughter.  Identity verified.  This care manager introduced self and stated purpose of call.  [facility] care management services explained.    State they have just sat down to eat, interested in program but as for a call back.  Plan: RN CM will send outreach letter and follow up on 5/30 as requested.  Valente David, RN, MSN, [place] Manager [phone]

## 2021-10-15 NOTE — Patient Outreach (Signed)
Center Coordinator follow up. Verified in [facility] Mrs. Deroo transitioned from [facility] to home on 10/05/21. Home health provided thru [facility] home health.   Chart reviewed. Mrs. Wooding has attended follow up appointments. Will not make referral to [facility] after all.   Attempted to telephone outreach to Mrs. Rodena Piety. No answer.   Will make referral to [facility] for complex case management/care coordination.   Marthenia Rolling, MSN, RN,BSN [place] Acute Care Coordinator [phone] [facility]) [phone]  (Toll free office)

## 2021-10-16 DIAGNOSIS — Z7901 Long term (current) use of anticoagulants: Secondary | ICD-10-CM | POA: Diagnosis not present

## 2021-10-16 DIAGNOSIS — I358 Other nonrheumatic aortic valve disorders: Secondary | ICD-10-CM | POA: Diagnosis not present

## 2021-10-16 DIAGNOSIS — E1159 Type 2 diabetes mellitus with other circulatory complications: Secondary | ICD-10-CM | POA: Diagnosis not present

## 2021-10-16 DIAGNOSIS — I152 Hypertension secondary to endocrine disorders: Secondary | ICD-10-CM | POA: Diagnosis not present

## 2021-10-16 DIAGNOSIS — I4891 Unspecified atrial fibrillation: Secondary | ICD-10-CM | POA: Diagnosis not present

## 2021-10-16 DIAGNOSIS — Z7984 Long term (current) use of oral hypoglycemic drugs: Secondary | ICD-10-CM | POA: Diagnosis not present

## 2021-10-16 DIAGNOSIS — Z853 Personal history of malignant neoplasm of breast: Secondary | ICD-10-CM | POA: Diagnosis not present

## 2021-10-16 DIAGNOSIS — J9610 Chronic respiratory failure, unspecified whether with hypoxia or hypercapnia: Secondary | ICD-10-CM | POA: Diagnosis not present

## 2021-10-16 DIAGNOSIS — Z8781 Personal history of (healed) traumatic fracture: Secondary | ICD-10-CM | POA: Diagnosis not present

## 2021-10-16 DIAGNOSIS — M4186 Other forms of scoliosis, lumbar region: Secondary | ICD-10-CM | POA: Diagnosis not present

## 2021-10-16 DIAGNOSIS — Z9012 Acquired absence of left breast and nipple: Secondary | ICD-10-CM | POA: Diagnosis not present

## 2021-10-16 DIAGNOSIS — I5032 Chronic diastolic (congestive) heart failure: Secondary | ICD-10-CM | POA: Diagnosis not present

## 2021-10-16 DIAGNOSIS — Z9181 History of falling: Secondary | ICD-10-CM | POA: Diagnosis not present

## 2021-10-16 DIAGNOSIS — M6281 Muscle weakness (generalized): Secondary | ICD-10-CM | POA: Diagnosis not present

## 2021-10-16 DIAGNOSIS — E785 Hyperlipidemia, unspecified: Secondary | ICD-10-CM | POA: Diagnosis not present

## 2021-10-16 DIAGNOSIS — I714 Abdominal aortic aneurysm, without rupture, unspecified: Secondary | ICD-10-CM | POA: Diagnosis not present

## 2021-10-16 DIAGNOSIS — R499 Unspecified voice and resonance disorder: Secondary | ICD-10-CM | POA: Diagnosis not present

## 2021-10-16 DIAGNOSIS — Z794 Long term (current) use of insulin: Secondary | ICD-10-CM | POA: Diagnosis not present

## 2021-10-16 DIAGNOSIS — G479 Sleep disorder, unspecified: Secondary | ICD-10-CM | POA: Diagnosis not present

## 2021-10-16 DIAGNOSIS — Z9981 Dependence on supplemental oxygen: Secondary | ICD-10-CM | POA: Diagnosis not present

## 2021-10-16 DIAGNOSIS — M1990 Unspecified osteoarthritis, unspecified site: Secondary | ICD-10-CM | POA: Diagnosis not present

## 2021-10-16 DIAGNOSIS — K5730 Diverticulosis of large intestine without perforation or abscess without bleeding: Secondary | ICD-10-CM | POA: Diagnosis not present

## 2021-10-20 ENCOUNTER — Encounter: Payer: Self-pay | Admitting: *Deleted

## 2021-10-20 ENCOUNTER — Other Ambulatory Visit: Payer: Self-pay | Admitting: *Deleted

## 2021-10-20 NOTE — Patient Outreach (Signed)
[facility]) Care Management {CENTRALTEAMROLES:27034} Note   10/20/2021 Name:  Tara Bradley MRN:  [id number] DOB:  1922/09/10  Summary: ***  Recommendations/Changes made from today's visit: ***  Subjective: Tara Bradley is an 88 y.o. year old female who is a primary patient of [name], MD. The care management team was consulted for assistance with care management and/or care coordination needs.    {CENTRALTEAMROLES:27034} completed {CENTRALVISITTYPE:27035} today.  Objective:   Medications Reviewed Today     Reviewed by Gardiner Barefoot, DPM (Physician) on 10/14/21 at 1608  Med List Status: <None>   Medication Order Taking? Sig Documenting Provider Last Dose Status Informant  acetaZOLAMIDE (DIAMOX) 250 MG tablet [id number]  Take 1 tablet (250 mg total) by mouth daily. Darreld Mclean, PA-C  Active   BD PEN NEEDLE NANO U/F 32G X 4 MM MISC [id number] No Inject into the skin daily. [provider] Taking Active Nursing Home Medication Administration Guide (MAG)  Calcium Carbonate-Vitamin D 600-400 MG-UNIT tablet [id number] No Take 1 tablet by mouth daily. [provider] Taking Active Nursing Home Medication Administration Guide (MAG)  Cholecalciferol (VITAMIN D) 125 MCG (5000 UT) CAPS [id number] No Take 5,000 Units by mouth daily. [provider] Taking Active Nursing Home Medication Administration Guide (MAG)  Dextromethorphan-guaiFENesin 5-100 MG/5ML LIQD [id number] No Take 10 mLs by mouth 3 (three) times daily. [provider] Taking Active Nursing Home Medication Administration Guide (MAG)  diltiazem (CARDIZEM CD) 360 MG 24 hr capsule [id number] No Take 1 capsule (360 mg total) by mouth daily. Shelly Coss, MD Taking Active Nursing Home Medication Administration Guide (MAG)  eszopiclone (LUNESTA) 2 MG TABS tablet [id number] No Take 2 mg by mouth at bedtime. Take immediately before bedtime [provider] Taking  Active Nursing Home Medication Administration Guide (MAG)  ferrous gluconate (FERGON) 324 MG tablet [id number] No Take 324 mg by mouth 2 (two) times daily with a meal. [provider] Taking Active Nursing Home Medication Administration Guide (MAG)  furosemide (LASIX) 40 MG tablet [id number] No Take 2 tablets (80 mg total) by mouth 2 (two) times daily for 4 days, THEN 1 tablet (40 mg total) 2 (two) times daily. Take extra 40 mg tablet for weight gain of 3 lbs in 1 day or 5 lbs in 1 week. Lavina Hamman, MD Taking Active   gabapentin (NEURONTIN) 100 MG capsule [id number] No Take 100 mg by mouth 3 (three) times daily. [provider] Taking Active Nursing Home Medication Administration Guide (MAG)  insulin glargine (LANTUS) 100 UNIT/ML Solostar Pen [id number] No Inject 8 Units into the skin daily. [provider] Taking Active Nursing Home Medication Administration Guide (MAG)  insulin lispro (HUMALOG) 100 UNIT/ML injection [id number] No Inject 0-12 Units into the skin daily. Sliding scale : If blood sugar less than 70, call NP 70-200 = 0 units 201-250 = 2 units 251-300 = 4 units 301-350 = 6 units 351-400 = 8 units 401-450 = 10 units 451-600 = 12 units If greater than 450 give 12 units. Recheck in 2 hours. If >350 or <100 notify provider. [provider] Taking Active Nursing Home Medication Administration Guide (MAG)  ipratropium-albuterol (DUONEB) 0.5-2.5 (3) MG/3ML SOLN [id number] No Take 3 mLs by nebulization 2 (two) times daily. [provider] Taking Active Nursing Home Medication Administration Guide (MAG)  magnesium oxide (MAG-OX) 400 MG tablet [id number] No Take 400 mg by mouth at bedtime. [provider] Taking Active Nursing Home Medication Administration Guide (MAG)  metFORMIN ([place])  500 MG (MOD) 24 hr tablet [id number] No Take 1,000 mg by mouth every evening. [provider] Taking Active Nursing Home Medication Administration  Guide (MAG)  metoprolol succinate (TOPROL-XL) 100 MG 24 hr tablet [id number] No Take 1 tablet (100 mg total) by mouth daily. Take with or immediately following a meal. Lavina Hamman, MD Taking Active   metoprolol tartrate (LOPRESSOR) 25 MG tablet [id number] No Take 25 mg by mouth as needed. [provider] Taking Active   polyethylene glycol (MIRALAX / GLYCOLAX) 17 g packet [id number] No Take 17 g by mouth daily. Shelly Coss, MD Taking Active Nursing Home Medication Administration Guide (MAG)  potassium chloride (KLOR-CON) 10 MEQ tablet [id number] No Take 2 tablets (20 mEq total) by mouth daily. Lavina Hamman, MD Taking Active   senna (SENOKOT) 8.6 MG TABS tablet [id number] No Take 1 tablet (8.6 mg total) by mouth 2 (two) times daily. Shelly Coss, MD Taking Active Nursing Home Medication Administration Guide (MAG)  tamsulosin [facility]) 0.4 MG CAPS capsule [id number] No Take 1 capsule (0.4 mg total) by mouth daily. Shelly Coss, MD Taking Active Nursing Home Medication Administration Guide (MAG)  traZODone (DESYREL) 50 MG tablet [id number] No Take 50 mg by mouth at bedtime as needed for sleep. [provider] Taking Active Nursing Home Medication Administration Guide (MAG)  white petrolatum (VASELINE) GEL [id number] No Apply 1 application. topically in the morning, at noon, in the evening, and at bedtime. [provider] Taking Active Nursing Home Medication Administration Guide (MAG)  XARELTO 15 MG TABS tablet [id number] No TAKE 1 TABLET ONCE DAILY WITH SUPPER.  Patient taking differently: Take 15 mg by mouth daily with supper.   [name], MD Taking Active Nursing Home Medication Administration Guide (MAG)  Med List Note [name], CPhT 09/02/21 1829): [place] .             SDOH:  (Social Determinants of Health) assessments and interventions performed:     Care Plan  Review of patient past medical history, allergies, medications,  health status, including review of consultants reports, laboratory and other test data, was performed as part of comprehensive evaluation for care management services.   There are no care plans that you recently modified to display for this patient.    Plan:  {RN CP FOLLOW UP PLAN:25073}  ***

## 2021-10-21 ENCOUNTER — Encounter: Payer: Self-pay | Admitting: Student

## 2021-10-21 ENCOUNTER — Ambulatory Visit (INDEPENDENT_AMBULATORY_CARE_PROVIDER_SITE_OTHER): Payer: Medicare Other | Admitting: Student

## 2021-10-21 VITALS — BP 107/62 | HR 108 | Ht 66.0 in | Wt 139.0 lb

## 2021-10-21 DIAGNOSIS — J9611 Chronic respiratory failure with hypoxia: Secondary | ICD-10-CM | POA: Diagnosis not present

## 2021-10-21 DIAGNOSIS — I10 Essential (primary) hypertension: Secondary | ICD-10-CM

## 2021-10-21 DIAGNOSIS — I71012 Dissection of descending thoracic aorta: Secondary | ICD-10-CM

## 2021-10-21 DIAGNOSIS — E118 Type 2 diabetes mellitus with unspecified complications: Secondary | ICD-10-CM | POA: Diagnosis not present

## 2021-10-21 DIAGNOSIS — E785 Hyperlipidemia, unspecified: Secondary | ICD-10-CM

## 2021-10-21 DIAGNOSIS — Z794 Long term (current) use of insulin: Secondary | ICD-10-CM

## 2021-10-21 DIAGNOSIS — J90 Pleural effusion, not elsewhere classified: Secondary | ICD-10-CM | POA: Diagnosis not present

## 2021-10-21 DIAGNOSIS — I4821 Permanent atrial fibrillation: Secondary | ICD-10-CM | POA: Diagnosis not present

## 2021-10-21 DIAGNOSIS — I5032 Chronic diastolic (congestive) heart failure: Secondary | ICD-10-CM

## 2021-10-21 NOTE — Patient Instructions (Signed)
Visit Information  Thank you for taking time to visit with me today. Please don't hesitate to contact me if I can be of assistance to you before our next scheduled telephone appointment.  Following are the goals we discussed today:  Monitor weight, blood pressure, heart rate daily and record readings. Notify provider with weight gain greater than 3 pounds over night or greater then 5 pounds in one week.  Our next appointment is by telephone next week.  Patient verbalizes understanding of instructions and care plan provided today and agrees to view in [facility]. Active MyChart status and patient understanding of how to access instructions and care plan via MyChart confirmed with patient.     The patient has been provided with contact information for the care management team and has been advised to call with any health related questions or concerns.   Valente David, RN, MSN, [place] Manager [phone]

## 2021-10-21 NOTE — Patient Instructions (Signed)
Medication Instructions:  No Changes *If you need a refill on your cardiac medications before your next appointment, please call your pharmacy*   Lab Work: BMET Today If you have labs (blood work) drawn today and your tests are completely normal, you will receive your results only by: [place] (if you have MyChart) OR A paper copy in the mail If you have any lab test that is abnormal or we need to change your treatment, we will call you to review the results.   Testing/Procedures: No Testing   Follow-Up: At [facility], you and your health needs are our priority.  As part of our continuing mission to provide you with exceptional heart care, we have created designated Provider Care Teams.  These Care Teams include your primary Cardiologist (physician) and Advanced Practice Providers (APPs -  Physician Assistants and Nurse Practitioners) who all work together to provide you with the care you need, when you need it.  We recommend signing up for the patient portal called "MyChart".  Sign up information is provided on this After Visit Summary.  MyChart is used to connect with patients for Virtual Visits (Telemedicine).  Patients are able to view lab/test results, encounter notes, upcoming appointments, etc.  Non-urgent messages can be sent to your provider as well.   To learn more about what you can do with MyChart, go to [email or web address].    Your next appointment:   Keep Scheduled Appointment  The format for your next appointment:   In Person  Provider:   [name], MD     Other Instructions Try weaning off oxygen, Wean down to 2-3 liters while resting.  Important Information About Sugar

## 2021-10-22 ENCOUNTER — Other Ambulatory Visit: Payer: Self-pay

## 2021-10-22 DIAGNOSIS — Z9012 Acquired absence of left breast and nipple: Secondary | ICD-10-CM | POA: Diagnosis not present

## 2021-10-22 DIAGNOSIS — K5730 Diverticulosis of large intestine without perforation or abscess without bleeding: Secondary | ICD-10-CM | POA: Diagnosis not present

## 2021-10-22 DIAGNOSIS — Z9181 History of falling: Secondary | ICD-10-CM | POA: Diagnosis not present

## 2021-10-22 DIAGNOSIS — E1159 Type 2 diabetes mellitus with other circulatory complications: Secondary | ICD-10-CM | POA: Diagnosis not present

## 2021-10-22 DIAGNOSIS — M1990 Unspecified osteoarthritis, unspecified site: Secondary | ICD-10-CM | POA: Diagnosis not present

## 2021-10-22 DIAGNOSIS — I4891 Unspecified atrial fibrillation: Secondary | ICD-10-CM | POA: Diagnosis not present

## 2021-10-22 DIAGNOSIS — Z7901 Long term (current) use of anticoagulants: Secondary | ICD-10-CM | POA: Diagnosis not present

## 2021-10-22 DIAGNOSIS — I714 Abdominal aortic aneurysm, without rupture, unspecified: Secondary | ICD-10-CM | POA: Diagnosis not present

## 2021-10-22 DIAGNOSIS — R499 Unspecified voice and resonance disorder: Secondary | ICD-10-CM | POA: Diagnosis not present

## 2021-10-22 DIAGNOSIS — M6281 Muscle weakness (generalized): Secondary | ICD-10-CM | POA: Diagnosis not present

## 2021-10-22 DIAGNOSIS — Z794 Long term (current) use of insulin: Secondary | ICD-10-CM | POA: Diagnosis not present

## 2021-10-22 DIAGNOSIS — G479 Sleep disorder, unspecified: Secondary | ICD-10-CM | POA: Diagnosis not present

## 2021-10-22 DIAGNOSIS — I358 Other nonrheumatic aortic valve disorders: Secondary | ICD-10-CM | POA: Diagnosis not present

## 2021-10-22 DIAGNOSIS — Z7984 Long term (current) use of oral hypoglycemic drugs: Secondary | ICD-10-CM | POA: Diagnosis not present

## 2021-10-22 DIAGNOSIS — E785 Hyperlipidemia, unspecified: Secondary | ICD-10-CM | POA: Diagnosis not present

## 2021-10-22 DIAGNOSIS — Z8781 Personal history of (healed) traumatic fracture: Secondary | ICD-10-CM | POA: Diagnosis not present

## 2021-10-22 DIAGNOSIS — I152 Hypertension secondary to endocrine disorders: Secondary | ICD-10-CM | POA: Diagnosis not present

## 2021-10-22 DIAGNOSIS — Z9981 Dependence on supplemental oxygen: Secondary | ICD-10-CM | POA: Diagnosis not present

## 2021-10-22 DIAGNOSIS — Z853 Personal history of malignant neoplasm of breast: Secondary | ICD-10-CM | POA: Diagnosis not present

## 2021-10-22 DIAGNOSIS — J9610 Chronic respiratory failure, unspecified whether with hypoxia or hypercapnia: Secondary | ICD-10-CM | POA: Diagnosis not present

## 2021-10-22 DIAGNOSIS — I5032 Chronic diastolic (congestive) heart failure: Secondary | ICD-10-CM | POA: Diagnosis not present

## 2021-10-22 DIAGNOSIS — M4186 Other forms of scoliosis, lumbar region: Secondary | ICD-10-CM | POA: Diagnosis not present

## 2021-10-22 LAB — BASIC METABOLIC PANEL
BUN/Creatinine Ratio: 44 — ABNORMAL HIGH (ref 12–28)
BUN: 46 mg/dL — ABNORMAL HIGH (ref 10–36)
CO2: 27 mmol/L (ref 20–29)
Calcium: 9.5 mg/dL (ref 8.7–10.3)
Chloride: 96 mmol/L (ref 96–106)
Creatinine, Ser: 1.05 mg/dL — ABNORMAL HIGH (ref 0.57–1.00)
Glucose: 204 mg/dL — ABNORMAL HIGH (ref 70–99)
Potassium: 4.4 mmol/L (ref 3.5–5.2)
Sodium: 139 mmol/L (ref 134–144)
eGFR: 48 mL/min/{1.73_m2} — ABNORMAL LOW (ref >59)

## 2021-10-23 DIAGNOSIS — Z7984 Long term (current) use of oral hypoglycemic drugs: Secondary | ICD-10-CM | POA: Diagnosis not present

## 2021-10-23 DIAGNOSIS — Z853 Personal history of malignant neoplasm of breast: Secondary | ICD-10-CM | POA: Diagnosis not present

## 2021-10-23 DIAGNOSIS — I358 Other nonrheumatic aortic valve disorders: Secondary | ICD-10-CM | POA: Diagnosis not present

## 2021-10-23 DIAGNOSIS — M6281 Muscle weakness (generalized): Secondary | ICD-10-CM | POA: Diagnosis not present

## 2021-10-23 DIAGNOSIS — I152 Hypertension secondary to endocrine disorders: Secondary | ICD-10-CM | POA: Diagnosis not present

## 2021-10-23 DIAGNOSIS — Z8781 Personal history of (healed) traumatic fracture: Secondary | ICD-10-CM | POA: Diagnosis not present

## 2021-10-23 DIAGNOSIS — I4891 Unspecified atrial fibrillation: Secondary | ICD-10-CM | POA: Diagnosis not present

## 2021-10-23 DIAGNOSIS — Z9181 History of falling: Secondary | ICD-10-CM | POA: Diagnosis not present

## 2021-10-23 DIAGNOSIS — Z9981 Dependence on supplemental oxygen: Secondary | ICD-10-CM | POA: Diagnosis not present

## 2021-10-23 DIAGNOSIS — J9610 Chronic respiratory failure, unspecified whether with hypoxia or hypercapnia: Secondary | ICD-10-CM | POA: Diagnosis not present

## 2021-10-23 DIAGNOSIS — Z794 Long term (current) use of insulin: Secondary | ICD-10-CM | POA: Diagnosis not present

## 2021-10-23 DIAGNOSIS — I714 Abdominal aortic aneurysm, without rupture, unspecified: Secondary | ICD-10-CM | POA: Diagnosis not present

## 2021-10-23 DIAGNOSIS — E785 Hyperlipidemia, unspecified: Secondary | ICD-10-CM | POA: Diagnosis not present

## 2021-10-23 DIAGNOSIS — Z9012 Acquired absence of left breast and nipple: Secondary | ICD-10-CM | POA: Diagnosis not present

## 2021-10-23 DIAGNOSIS — E1159 Type 2 diabetes mellitus with other circulatory complications: Secondary | ICD-10-CM | POA: Diagnosis not present

## 2021-10-23 DIAGNOSIS — Z7901 Long term (current) use of anticoagulants: Secondary | ICD-10-CM | POA: Diagnosis not present

## 2021-10-23 DIAGNOSIS — I5032 Chronic diastolic (congestive) heart failure: Secondary | ICD-10-CM | POA: Diagnosis not present

## 2021-10-23 DIAGNOSIS — M1990 Unspecified osteoarthritis, unspecified site: Secondary | ICD-10-CM | POA: Diagnosis not present

## 2021-10-23 DIAGNOSIS — R499 Unspecified voice and resonance disorder: Secondary | ICD-10-CM | POA: Diagnosis not present

## 2021-10-23 DIAGNOSIS — G479 Sleep disorder, unspecified: Secondary | ICD-10-CM | POA: Diagnosis not present

## 2021-10-23 DIAGNOSIS — K5730 Diverticulosis of large intestine without perforation or abscess without bleeding: Secondary | ICD-10-CM | POA: Diagnosis not present

## 2021-10-23 DIAGNOSIS — M4186 Other forms of scoliosis, lumbar region: Secondary | ICD-10-CM | POA: Diagnosis not present

## 2021-10-26 ENCOUNTER — Other Ambulatory Visit: Payer: Self-pay

## 2021-10-26 ENCOUNTER — Other Ambulatory Visit: Payer: Self-pay | Admitting: *Deleted

## 2021-10-26 ENCOUNTER — Other Ambulatory Visit: Payer: Self-pay | Admitting: Cardiology

## 2021-10-26 DIAGNOSIS — M6281 Muscle weakness (generalized): Secondary | ICD-10-CM | POA: Diagnosis not present

## 2021-10-26 DIAGNOSIS — Z853 Personal history of malignant neoplasm of breast: Secondary | ICD-10-CM | POA: Diagnosis not present

## 2021-10-26 DIAGNOSIS — K5730 Diverticulosis of large intestine without perforation or abscess without bleeding: Secondary | ICD-10-CM | POA: Diagnosis not present

## 2021-10-26 DIAGNOSIS — Z9181 History of falling: Secondary | ICD-10-CM | POA: Diagnosis not present

## 2021-10-26 DIAGNOSIS — I4891 Unspecified atrial fibrillation: Secondary | ICD-10-CM | POA: Diagnosis not present

## 2021-10-26 DIAGNOSIS — G479 Sleep disorder, unspecified: Secondary | ICD-10-CM | POA: Diagnosis not present

## 2021-10-26 DIAGNOSIS — I358 Other nonrheumatic aortic valve disorders: Secondary | ICD-10-CM | POA: Diagnosis not present

## 2021-10-26 DIAGNOSIS — Z9981 Dependence on supplemental oxygen: Secondary | ICD-10-CM | POA: Diagnosis not present

## 2021-10-26 DIAGNOSIS — I152 Hypertension secondary to endocrine disorders: Secondary | ICD-10-CM | POA: Diagnosis not present

## 2021-10-26 DIAGNOSIS — M4186 Other forms of scoliosis, lumbar region: Secondary | ICD-10-CM | POA: Diagnosis not present

## 2021-10-26 DIAGNOSIS — R499 Unspecified voice and resonance disorder: Secondary | ICD-10-CM | POA: Diagnosis not present

## 2021-10-26 DIAGNOSIS — J9610 Chronic respiratory failure, unspecified whether with hypoxia or hypercapnia: Secondary | ICD-10-CM | POA: Diagnosis not present

## 2021-10-26 DIAGNOSIS — Z79899 Other long term (current) drug therapy: Secondary | ICD-10-CM

## 2021-10-26 DIAGNOSIS — I714 Abdominal aortic aneurysm, without rupture, unspecified: Secondary | ICD-10-CM | POA: Diagnosis not present

## 2021-10-26 DIAGNOSIS — M1990 Unspecified osteoarthritis, unspecified site: Secondary | ICD-10-CM | POA: Diagnosis not present

## 2021-10-26 DIAGNOSIS — Z7901 Long term (current) use of anticoagulants: Secondary | ICD-10-CM | POA: Diagnosis not present

## 2021-10-26 DIAGNOSIS — Z794 Long term (current) use of insulin: Secondary | ICD-10-CM | POA: Diagnosis not present

## 2021-10-26 DIAGNOSIS — I5032 Chronic diastolic (congestive) heart failure: Secondary | ICD-10-CM | POA: Diagnosis not present

## 2021-10-26 DIAGNOSIS — Z8781 Personal history of (healed) traumatic fracture: Secondary | ICD-10-CM | POA: Diagnosis not present

## 2021-10-26 DIAGNOSIS — Z9012 Acquired absence of left breast and nipple: Secondary | ICD-10-CM | POA: Diagnosis not present

## 2021-10-26 DIAGNOSIS — E1159 Type 2 diabetes mellitus with other circulatory complications: Secondary | ICD-10-CM | POA: Diagnosis not present

## 2021-10-26 DIAGNOSIS — E785 Hyperlipidemia, unspecified: Secondary | ICD-10-CM | POA: Diagnosis not present

## 2021-10-26 DIAGNOSIS — Z7984 Long term (current) use of oral hypoglycemic drugs: Secondary | ICD-10-CM | POA: Diagnosis not present

## 2021-10-26 NOTE — Patient Outreach (Signed)
[facility]) Care Management Telephonic RN Care Manager Note   10/26/2021 Name:  Tara Bradley MRN:  [id number] DOB:  23-Sep-1922  Summary: Weekly transition of care call placed to member, successful to her and daughter.  Denies any urgent concerns, encouraged to contact this care manager with questions.     Subjective: Tara Bradley is an 88 y.o. year old female who is a primary patient of [name], MD. The care management team was consulted for assistance with care management and/or care coordination needs.    Telephonic RN Care Manager completed Telephone Visit today.  Objective:   Medications Reviewed Today     Reviewed by Eppie Gibson (Physician Assistant Certified) on 35/00/93 at 2207  Med List Status: <None>   Medication Order Taking? Sig Documenting Provider Last Dose Status Informant  acetaZOLAMIDE (DIAMOX) 250 MG tablet [id number] Yes Take 1 tablet (250 mg total) by mouth daily. Darreld Mclean, PA-C Taking Active   BD PEN NEEDLE NANO U/F 32G X 4 MM MISC [id number] Yes Inject into the skin daily. [provider] Taking Active Nursing Home Medication Administration Guide (MAG)  Calcium Carbonate-Vitamin D 600-400 MG-UNIT tablet [id number] Yes Take 1 tablet by mouth daily. [provider] Taking Active Nursing Home Medication Administration Guide (MAG)  Cholecalciferol (VITAMIN D) 125 MCG (5000 UT) CAPS [id number] Yes Take 5,000 Units by mouth daily. [provider] Taking Active Nursing Home Medication Administration Guide (MAG)  Dextromethorphan-guaiFENesin 5-100 MG/5ML LIQD [id number] Yes Take 10 mLs by mouth 3 (three) times daily. [provider] Taking Active Nursing Home Medication Administration Guide (MAG)  diltiazem (CARDIZEM CD) 360 MG 24 hr capsule [id number] Yes Take 1 capsule (360 mg total) by mouth daily. Shelly Coss, MD Taking Active Nursing Home Medication Administration Guide (MAG)   eszopiclone (LUNESTA) 2 MG TABS tablet [id number] Yes Take 2 mg by mouth at bedtime. Take immediately before bedtime [provider] Taking Active Nursing Home Medication Administration Guide (MAG)  ferrous gluconate (FERGON) 324 MG tablet [id number] Yes Take 324 mg by mouth 2 (two) times daily with a meal. [provider] Taking Active Nursing Home Medication Administration Guide (MAG)  furosemide (LASIX) 40 MG tablet [id number] Yes Take 2 tablets (80 mg total) by mouth 2 (two) times daily for 4 days, THEN 1 tablet (40 mg total) 2 (two) times daily. Take extra 40 mg tablet for weight gain of 3 lbs in 1 day or 5 lbs in 1 week. Lavina Hamman, MD Taking Active   gabapentin (NEURONTIN) 100 MG capsule [id number] Yes Take 100 mg by mouth 3 (three) times daily. [provider] Taking Active Nursing Home Medication Administration Guide (MAG)  insulin glargine (LANTUS) 100 UNIT/ML Solostar Pen [id number] Yes Inject 8 Units into the skin daily. [provider] Taking Active Nursing Home Medication Administration Guide (MAG)  insulin lispro (HUMALOG) 100 UNIT/ML injection [id number] Yes Inject 0-12 Units into the skin daily. Sliding scale : If blood sugar less than 70, call NP 70-200 = 0 units 201-250 = 2 units 251-300 = 4 units 301-350 = 6 units 351-400 = 8 units 401-450 = 10 units 451-600 = 12 units If greater than 450 give 12 units. Recheck in 2 hours. If >350 or <100 notify provider. [provider] Taking Active Nursing Home Medication Administration Guide (MAG)  ipratropium-albuterol (DUONEB) 0.5-2.5 (3) MG/3ML SOLN [id number] Yes Take 3 mLs by nebulization 2 (two) times daily. [provider] Taking Active Nursing Home Medication Administration Guide (  MAG)  magnesium oxide (MAG-OX) 400 MG tablet [id number] Yes Take 400 mg by mouth at bedtime. [provider] Taking Active Nursing Home Medication Administration Guide (MAG)  metFORMIN (GLUMETZA)  500 MG (MOD) 24 hr tablet [id number] Yes Take 1,000 mg by mouth every evening. [provider] Taking Active Nursing Home Medication Administration Guide (MAG)  metoprolol succinate (TOPROL-XL) 100 MG 24 hr tablet [id number] Yes Take 1 tablet (100 mg total) by mouth daily. Take with or immediately following a meal. Lavina Hamman, MD Taking Active   metoprolol tartrate (LOPRESSOR) 25 MG tablet [id number] Yes Take 25 mg by mouth as needed. [provider] Taking Active   polyethylene glycol (MIRALAX / GLYCOLAX) 17 g packet [id number] Yes Take 17 g by mouth daily. Shelly Coss, MD Taking Active Nursing Home Medication Administration Guide (MAG)  potassium chloride (KLOR-CON) 10 MEQ tablet [id number] Yes Take 2 tablets (20 mEq total) by mouth daily. Lavina Hamman, MD Taking Active   senna (SENOKOT) 8.6 MG TABS tablet [id number] Yes Take 1 tablet (8.6 mg total) by mouth 2 (two) times daily. Shelly Coss, MD Taking Active Nursing Home Medication Administration Guide (MAG)  tamsulosin [facility]) 0.4 MG CAPS capsule [id number] Yes Take 1 capsule (0.4 mg total) by mouth daily. Shelly Coss, MD Taking Active Nursing Home Medication Administration Guide (MAG)  traZODone (DESYREL) 50 MG tablet [id number] Yes Take 50 mg by mouth at bedtime as needed for sleep. [provider] Taking Active Nursing Home Medication Administration Guide (MAG)  white petrolatum (VASELINE) GEL [id number] Yes Apply 1 application. topically in the morning, at noon, in the evening, and at bedtime. [provider] Taking Active Nursing Home Medication Administration Guide (MAG)  XARELTO 15 MG TABS tablet [id number] Yes TAKE 1 TABLET ONCE DAILY WITH SUPPER.  Patient taking differently: Take 15 mg by mouth daily with supper.   [name], MD Taking Active Nursing Home Medication Administration Guide (MAG)  Med List Note [name], CPhT 09/02/21 1829): [place] .              SDOH:  (Social Determinants of Health) assessments and interventions performed:     Care Plan  Review of patient past medical history, allergies, medications, health status, including review of consultants reports, laboratory and other test data, was performed as part of comprehensive evaluation for care management services.   Care Plan : [facility] Plan of Care (Adult)  Updates made by Valente David, RN since 10/26/2021 12:00 AM     Problem: Care coordination and education needs related to chronic medical conditions (A-fib, CHF, DM)      Long-Range Goal: Member will show evidence of effectively managing chronic medical conditions (A-fib, CHF, DM)   Start Date: 10/21/2021  Expected End Date: 10/21/2022  This Visit's Progress: On track  Priority: High  Note:   Current Barriers:  Care Coordination needs related to ADL IADL limitations Chronic Disease Management support and education needs related to Atrial Fibrillation, CHF, and DMII   RNCM Clinical Goal(s):  Patient will verbalize understanding of plan for management of Atrial Fibrillation, CHF, and DMII as evidenced by member's ability to verbalize plan of care take all medications exactly as prescribed and will call provider for medication related questions as evidenced by member's reported adherence attend all scheduled medical appointments: PCP and cardiology as evidenced by attendance record in EMR continue to work with RN Care Manager to address care management and care coordination needs related to  Atrial Fibrillation, CHF, and  DMII as evidenced by adherence to CM Team Scheduled appointments work with home health to regain independence as evidenced by ability to bathe and dress independently  through collaboration with Consulting civil engineer, provider, and care team.   Interventions: Inter-disciplinary care team collaboration (see longitudinal plan of care) Evaluation of current treatment plan related to  self management and patient's  adherence to plan as established by provider   AFIB Interventions: (Status:  Condition stable.  Not addressed this visit.) Long Term Goal   Counseled on increased risk of stroke due to Afib and benefits of anticoagulation for stroke prevention Reviewed importance of adherence to anticoagulant exactly as prescribed   Heart Failure Interventions:  (Status:  Condition stable.  Not addressed this visit.) Long Term Goal Basic overview and discussion of pathophysiology of Heart Failure reviewed Reviewed Heart Failure Action Plan in depth and provided written copy Advised patient to weigh each morning after emptying bladder  Diabetes Interventions:  (Status:  Condition stable.  Not addressed this visit.) Long Term Goal Assessed patient's understanding of A1c goal: <7% Reviewed medications with patient and discussed importance of medication adherence Counseled on importance of regular laboratory monitoring as prescribed Discussed plans with patient for ongoing care management follow up and provided patient with direct contact information for care management team Lab Results  Component Value Date   HGBA1C 8.6 (H) 05/03/2021   Patient Goals/Self-Care Activities: call office if I gain more than 2 pounds in one day or 5 pounds in one week keep legs up while sitting track weight in diary check feet daily for cuts, sores or redness enter blood sugar readings and medication or insulin into daily log take the blood sugar log to all doctor visits check pulse (heart) rate once a day make a plan to eat healthy  Follow Up Plan:  The patient has been provided with contact information for the care management team and has been advised to call with any health related questions or concerns.    Update 6/5 - Member state she is no longer having shortness of breath.  Able to decrease oxygen to 2 liters during the day, wearing 4 liters at night.  She will try to wean off during the day to keep oxygen levels  greater than 88%.  Weight today was 136 pounds, 3 pound increase from last week, denies swelling.  Blood sugar was 85 today, unsure what her BP and HR were.  Will continue to monitor weights and notify provider with significant gain, will also monitor blood sugar, HR, and BP daily.  She was seen by cardiology on 5/31, no changes to plan of care.  She will follow up with PCP, cardiology, and pulmonology all in August.  Continues to be active with [facility] for PT, OT, and RN as well as private pay aides.  Daughter will plan to go home to [place] next Wednesday, denies need for additional resources at this time.         Plan:  Telephone follow up appointment with care management team member scheduled for:  next week. The patient has been provided with contact information for the care management team and has been advised to call with any health related questions or concerns.   Valente David, RN, MSN, [place] Manager [phone]

## 2021-10-27 DIAGNOSIS — Z9012 Acquired absence of left breast and nipple: Secondary | ICD-10-CM | POA: Diagnosis not present

## 2021-10-27 DIAGNOSIS — M6281 Muscle weakness (generalized): Secondary | ICD-10-CM | POA: Diagnosis not present

## 2021-10-27 DIAGNOSIS — I714 Abdominal aortic aneurysm, without rupture, unspecified: Secondary | ICD-10-CM | POA: Diagnosis not present

## 2021-10-27 DIAGNOSIS — Z853 Personal history of malignant neoplasm of breast: Secondary | ICD-10-CM | POA: Diagnosis not present

## 2021-10-27 DIAGNOSIS — I5032 Chronic diastolic (congestive) heart failure: Secondary | ICD-10-CM | POA: Diagnosis not present

## 2021-10-27 DIAGNOSIS — K5730 Diverticulosis of large intestine without perforation or abscess without bleeding: Secondary | ICD-10-CM | POA: Diagnosis not present

## 2021-10-27 DIAGNOSIS — Z9181 History of falling: Secondary | ICD-10-CM | POA: Diagnosis not present

## 2021-10-27 DIAGNOSIS — Z794 Long term (current) use of insulin: Secondary | ICD-10-CM | POA: Diagnosis not present

## 2021-10-27 DIAGNOSIS — Z7984 Long term (current) use of oral hypoglycemic drugs: Secondary | ICD-10-CM | POA: Diagnosis not present

## 2021-10-27 DIAGNOSIS — J9610 Chronic respiratory failure, unspecified whether with hypoxia or hypercapnia: Secondary | ICD-10-CM | POA: Diagnosis not present

## 2021-10-27 DIAGNOSIS — I4891 Unspecified atrial fibrillation: Secondary | ICD-10-CM | POA: Diagnosis not present

## 2021-10-27 DIAGNOSIS — R499 Unspecified voice and resonance disorder: Secondary | ICD-10-CM | POA: Diagnosis not present

## 2021-10-27 DIAGNOSIS — M4186 Other forms of scoliosis, lumbar region: Secondary | ICD-10-CM | POA: Diagnosis not present

## 2021-10-27 DIAGNOSIS — G479 Sleep disorder, unspecified: Secondary | ICD-10-CM | POA: Diagnosis not present

## 2021-10-27 DIAGNOSIS — Z8781 Personal history of (healed) traumatic fracture: Secondary | ICD-10-CM | POA: Diagnosis not present

## 2021-10-27 DIAGNOSIS — Z9981 Dependence on supplemental oxygen: Secondary | ICD-10-CM | POA: Diagnosis not present

## 2021-10-27 DIAGNOSIS — I152 Hypertension secondary to endocrine disorders: Secondary | ICD-10-CM | POA: Diagnosis not present

## 2021-10-27 DIAGNOSIS — M1990 Unspecified osteoarthritis, unspecified site: Secondary | ICD-10-CM | POA: Diagnosis not present

## 2021-10-27 DIAGNOSIS — E1159 Type 2 diabetes mellitus with other circulatory complications: Secondary | ICD-10-CM | POA: Diagnosis not present

## 2021-10-27 DIAGNOSIS — Z7901 Long term (current) use of anticoagulants: Secondary | ICD-10-CM | POA: Diagnosis not present

## 2021-10-27 DIAGNOSIS — E785 Hyperlipidemia, unspecified: Secondary | ICD-10-CM | POA: Diagnosis not present

## 2021-10-27 DIAGNOSIS — I358 Other nonrheumatic aortic valve disorders: Secondary | ICD-10-CM | POA: Diagnosis not present

## 2021-10-27 LAB — BASIC METABOLIC PANEL
BUN/Creatinine Ratio: 39 — ABNORMAL HIGH (ref 12–28)
BUN: 36 mg/dL (ref 10–36)
CO2: 28 mmol/L (ref 20–29)
Calcium: 9.6 mg/dL (ref 8.7–10.3)
Chloride: 98 mmol/L (ref 96–106)
Creatinine, Ser: 0.92 mg/dL (ref 0.57–1.00)
Glucose: 177 mg/dL — ABNORMAL HIGH (ref 70–99)
Potassium: 4.7 mmol/L (ref 3.5–5.2)
Sodium: 141 mmol/L (ref 134–144)
eGFR: 57 mL/min/{1.73_m2} — ABNORMAL LOW (ref >59)

## 2021-10-27 LAB — CBC
Hematocrit: 32.3 % — ABNORMAL LOW (ref 34.0–46.6)
Hemoglobin: 9.9 g/dL — ABNORMAL LOW (ref 11.1–15.9)
MCH: 27.3 pg (ref 26.6–33.0)
MCHC: 30.7 g/dL — ABNORMAL LOW (ref 31.5–35.7)
MCV: 89 fL (ref 79–97)
Platelets: 230 10*3/uL (ref 150–450)
RBC: 3.63 x10E6/uL — ABNORMAL LOW (ref 3.77–5.28)
RDW: 14.2 % (ref 11.7–15.4)
WBC: 4.9 10*3/uL (ref 3.4–10.8)

## 2021-10-28 ENCOUNTER — Other Ambulatory Visit: Payer: Self-pay | Admitting: Cardiology

## 2021-10-28 DIAGNOSIS — G479 Sleep disorder, unspecified: Secondary | ICD-10-CM | POA: Diagnosis not present

## 2021-10-28 DIAGNOSIS — J9610 Chronic respiratory failure, unspecified whether with hypoxia or hypercapnia: Secondary | ICD-10-CM | POA: Diagnosis not present

## 2021-10-28 DIAGNOSIS — Z9181 History of falling: Secondary | ICD-10-CM | POA: Diagnosis not present

## 2021-10-28 DIAGNOSIS — I714 Abdominal aortic aneurysm, without rupture, unspecified: Secondary | ICD-10-CM | POA: Diagnosis not present

## 2021-10-28 DIAGNOSIS — K5730 Diverticulosis of large intestine without perforation or abscess without bleeding: Secondary | ICD-10-CM | POA: Diagnosis not present

## 2021-10-28 DIAGNOSIS — I358 Other nonrheumatic aortic valve disorders: Secondary | ICD-10-CM | POA: Diagnosis not present

## 2021-10-28 DIAGNOSIS — M1990 Unspecified osteoarthritis, unspecified site: Secondary | ICD-10-CM | POA: Diagnosis not present

## 2021-10-28 DIAGNOSIS — R499 Unspecified voice and resonance disorder: Secondary | ICD-10-CM | POA: Diagnosis not present

## 2021-10-28 DIAGNOSIS — I152 Hypertension secondary to endocrine disorders: Secondary | ICD-10-CM | POA: Diagnosis not present

## 2021-10-28 DIAGNOSIS — Z7901 Long term (current) use of anticoagulants: Secondary | ICD-10-CM | POA: Diagnosis not present

## 2021-10-28 DIAGNOSIS — Z853 Personal history of malignant neoplasm of breast: Secondary | ICD-10-CM | POA: Diagnosis not present

## 2021-10-28 DIAGNOSIS — Z9981 Dependence on supplemental oxygen: Secondary | ICD-10-CM | POA: Diagnosis not present

## 2021-10-28 DIAGNOSIS — E785 Hyperlipidemia, unspecified: Secondary | ICD-10-CM | POA: Diagnosis not present

## 2021-10-28 DIAGNOSIS — M6281 Muscle weakness (generalized): Secondary | ICD-10-CM | POA: Diagnosis not present

## 2021-10-28 DIAGNOSIS — Z7984 Long term (current) use of oral hypoglycemic drugs: Secondary | ICD-10-CM | POA: Diagnosis not present

## 2021-10-28 DIAGNOSIS — I5032 Chronic diastolic (congestive) heart failure: Secondary | ICD-10-CM | POA: Diagnosis not present

## 2021-10-28 DIAGNOSIS — Z8781 Personal history of (healed) traumatic fracture: Secondary | ICD-10-CM | POA: Diagnosis not present

## 2021-10-28 DIAGNOSIS — E1159 Type 2 diabetes mellitus with other circulatory complications: Secondary | ICD-10-CM | POA: Diagnosis not present

## 2021-10-28 DIAGNOSIS — M4186 Other forms of scoliosis, lumbar region: Secondary | ICD-10-CM | POA: Diagnosis not present

## 2021-10-28 DIAGNOSIS — I4891 Unspecified atrial fibrillation: Secondary | ICD-10-CM | POA: Diagnosis not present

## 2021-10-28 DIAGNOSIS — Z794 Long term (current) use of insulin: Secondary | ICD-10-CM | POA: Diagnosis not present

## 2021-10-28 DIAGNOSIS — Z9012 Acquired absence of left breast and nipple: Secondary | ICD-10-CM | POA: Diagnosis not present

## 2021-11-02 ENCOUNTER — Other Ambulatory Visit: Payer: Self-pay | Admitting: *Deleted

## 2021-11-02 DIAGNOSIS — R0689 Other abnormalities of breathing: Secondary | ICD-10-CM | POA: Diagnosis not present

## 2021-11-02 DIAGNOSIS — I5042 Chronic combined systolic (congestive) and diastolic (congestive) heart failure: Secondary | ICD-10-CM | POA: Diagnosis not present

## 2021-11-02 NOTE — Patient Outreach (Signed)
[facility]) Care Management Telephonic RN Care Manager Note   11/02/2021 Name:  Tara Bradley MRN:  [id number] DOB:  1922/10/14  Summary: Weekly transition of care call placed, successful to daughter but she requested call back as they were eating lunch.    Call placed back, unsuccessful, HIPAA compliant voice message left.     Subjective: Tara Bradley is an 88 y.o. year old female who is a primary patient of [name], MD. The care management team was consulted for assistance with care management and/or care coordination needs.    Telephonic RN Care Manager completed Telephone Visit today.  Objective:   Medications Reviewed Today     Reviewed by Eppie Gibson (Physician Assistant Certified) on 95/28/41 at 2207  Med List Status: <None>   Medication Order Taking? Sig Documenting Provider Last Dose Status Informant  acetaZOLAMIDE (DIAMOX) 250 MG tablet [id number] Yes Take 1 tablet (250 mg total) by mouth daily. Darreld Mclean, PA-C Taking Active   BD PEN NEEDLE NANO U/F 32G X 4 MM MISC [id number] Yes Inject into the skin daily. [provider] Taking Active Nursing Home Medication Administration Guide (MAG)  Calcium Carbonate-Vitamin D 600-400 MG-UNIT tablet [id number] Yes Take 1 tablet by mouth daily. [provider] Taking Active Nursing Home Medication Administration Guide (MAG)  Cholecalciferol (VITAMIN D) 125 MCG (5000 UT) CAPS [id number] Yes Take 5,000 Units by mouth daily. [provider] Taking Active Nursing Home Medication Administration Guide (MAG)  Dextromethorphan-guaiFENesin 5-100 MG/5ML LIQD [id number] Yes Take 10 mLs by mouth 3 (three) times daily. [provider] Taking Active Nursing Home Medication Administration Guide (MAG)  diltiazem (CARDIZEM CD) 360 MG 24 hr capsule [id number] Yes Take 1 capsule (360 mg total) by mouth daily. Shelly Coss, MD Taking Active Nursing Home Medication  Administration Guide (MAG)  eszopiclone (LUNESTA) 2 MG TABS tablet [id number] Yes Take 2 mg by mouth at bedtime. Take immediately before bedtime [provider] Taking Active Nursing Home Medication Administration Guide (MAG)  ferrous gluconate (FERGON) 324 MG tablet [id number] Yes Take 324 mg by mouth 2 (two) times daily with a meal. [provider] Taking Active Nursing Home Medication Administration Guide (MAG)  furosemide (LASIX) 40 MG tablet [id number] Yes Take 2 tablets (80 mg total) by mouth 2 (two) times daily for 4 days, THEN 1 tablet (40 mg total) 2 (two) times daily. Take extra 40 mg tablet for weight gain of 3 lbs in 1 day or 5 lbs in 1 week. Lavina Hamman, MD Taking Active   gabapentin (NEURONTIN) 100 MG capsule [id number] Yes Take 100 mg by mouth 3 (three) times daily. [provider] Taking Active Nursing Home Medication Administration Guide (MAG)  insulin glargine (LANTUS) 100 UNIT/ML Solostar Pen [id number] Yes Inject 8 Units into the skin daily. [provider] Taking Active Nursing Home Medication Administration Guide (MAG)  insulin lispro (HUMALOG) 100 UNIT/ML injection [id number] Yes Inject 0-12 Units into the skin daily. Sliding scale : If blood sugar less than 70, call NP 70-200 = 0 units 201-250 = 2 units 251-300 = 4 units 301-350 = 6 units 351-400 = 8 units 401-450 = 10 units 451-600 = 12 units If greater than 450 give 12 units. Recheck in 2 hours. If >350 or <100 notify provider. [provider] Taking Active Nursing Home Medication Administration Guide (MAG)  ipratropium-albuterol (DUONEB) 0.5-2.5 (3) MG/3ML SOLN [id number] Yes Take 3 mLs by nebulization 2 (two) times daily. [provider] Taking Active  Nursing Home Medication Administration Guide (MAG)  magnesium oxide (MAG-OX) 400 MG tablet [id number] Yes Take 400 mg by mouth at bedtime. [provider] Taking Active Nursing Home Medication Administration Guide  (MAG)  metFORMIN (GLUMETZA) 500 MG (MOD) 24 hr tablet [id number] Yes Take 1,000 mg by mouth every evening. [provider] Taking Active Nursing Home Medication Administration Guide (MAG)  metoprolol succinate (TOPROL-XL) 100 MG 24 hr tablet [id number] Yes Take 1 tablet (100 mg total) by mouth daily. Take with or immediately following a meal. Lavina Hamman, MD Taking Active   metoprolol tartrate (LOPRESSOR) 25 MG tablet [id number] Yes Take 25 mg by mouth as needed. [provider] Taking Active   polyethylene glycol (MIRALAX / GLYCOLAX) 17 g packet [id number] Yes Take 17 g by mouth daily. Shelly Coss, MD Taking Active Nursing Home Medication Administration Guide (MAG)  potassium chloride (KLOR-CON) 10 MEQ tablet [id number] Yes Take 2 tablets (20 mEq total) by mouth daily. Lavina Hamman, MD Taking Active   senna (SENOKOT) 8.6 MG TABS tablet [id number] Yes Take 1 tablet (8.6 mg total) by mouth 2 (two) times daily. Shelly Coss, MD Taking Active Nursing Home Medication Administration Guide (MAG)  tamsulosin [facility]) 0.4 MG CAPS capsule [id number] Yes Take 1 capsule (0.4 mg total) by mouth daily. Shelly Coss, MD Taking Active Nursing Home Medication Administration Guide (MAG)  traZODone (DESYREL) 50 MG tablet [id number] Yes Take 50 mg by mouth at bedtime as needed for sleep. [provider] Taking Active Nursing Home Medication Administration Guide (MAG)  white petrolatum (VASELINE) GEL [id number] Yes Apply 1 application. topically in the morning, at noon, in the evening, and at bedtime. [provider] Taking Active Nursing Home Medication Administration Guide (MAG)  XARELTO 15 MG TABS tablet [id number] Yes TAKE 1 TABLET ONCE DAILY WITH SUPPER.  Patient taking differently: Take 15 mg by mouth daily with supper.   [name], MD Taking Active Nursing Home Medication Administration Guide (MAG)  Med List Note [name], CPhT 09/02/21 1829): [place] .             SDOH:  (Social Determinants of Health) assessments and interventions performed:     Care Plan  Review of patient past medical history, allergies, medications, health status, including review of consultants reports, laboratory and other test data, was performed as part of comprehensive evaluation for care management services.      Plan:  Telephone follow up appointment with care management team member scheduled for:  tomorrow  as daughter would like to speak to this RNCM prior to her returning to [place].  Valente David, RN, MSN, [place] Manager [phone]

## 2021-11-03 ENCOUNTER — Other Ambulatory Visit: Payer: Self-pay | Admitting: *Deleted

## 2021-11-03 DIAGNOSIS — Z853 Personal history of malignant neoplasm of breast: Secondary | ICD-10-CM | POA: Diagnosis not present

## 2021-11-03 DIAGNOSIS — I358 Other nonrheumatic aortic valve disorders: Secondary | ICD-10-CM | POA: Diagnosis not present

## 2021-11-03 DIAGNOSIS — I714 Abdominal aortic aneurysm, without rupture, unspecified: Secondary | ICD-10-CM | POA: Diagnosis not present

## 2021-11-03 DIAGNOSIS — Z9012 Acquired absence of left breast and nipple: Secondary | ICD-10-CM | POA: Diagnosis not present

## 2021-11-03 DIAGNOSIS — I4891 Unspecified atrial fibrillation: Secondary | ICD-10-CM | POA: Diagnosis not present

## 2021-11-03 DIAGNOSIS — M1990 Unspecified osteoarthritis, unspecified site: Secondary | ICD-10-CM | POA: Diagnosis not present

## 2021-11-03 DIAGNOSIS — Z794 Long term (current) use of insulin: Secondary | ICD-10-CM | POA: Diagnosis not present

## 2021-11-03 DIAGNOSIS — J9610 Chronic respiratory failure, unspecified whether with hypoxia or hypercapnia: Secondary | ICD-10-CM | POA: Diagnosis not present

## 2021-11-03 DIAGNOSIS — I5032 Chronic diastolic (congestive) heart failure: Secondary | ICD-10-CM | POA: Diagnosis not present

## 2021-11-03 DIAGNOSIS — G479 Sleep disorder, unspecified: Secondary | ICD-10-CM | POA: Diagnosis not present

## 2021-11-03 DIAGNOSIS — Z9181 History of falling: Secondary | ICD-10-CM | POA: Diagnosis not present

## 2021-11-03 DIAGNOSIS — Z7984 Long term (current) use of oral hypoglycemic drugs: Secondary | ICD-10-CM | POA: Diagnosis not present

## 2021-11-03 DIAGNOSIS — R499 Unspecified voice and resonance disorder: Secondary | ICD-10-CM | POA: Diagnosis not present

## 2021-11-03 DIAGNOSIS — Z9981 Dependence on supplemental oxygen: Secondary | ICD-10-CM | POA: Diagnosis not present

## 2021-11-03 DIAGNOSIS — E1159 Type 2 diabetes mellitus with other circulatory complications: Secondary | ICD-10-CM | POA: Diagnosis not present

## 2021-11-03 DIAGNOSIS — Z8781 Personal history of (healed) traumatic fracture: Secondary | ICD-10-CM | POA: Diagnosis not present

## 2021-11-03 DIAGNOSIS — I152 Hypertension secondary to endocrine disorders: Secondary | ICD-10-CM | POA: Diagnosis not present

## 2021-11-03 DIAGNOSIS — M4186 Other forms of scoliosis, lumbar region: Secondary | ICD-10-CM | POA: Diagnosis not present

## 2021-11-03 DIAGNOSIS — K5730 Diverticulosis of large intestine without perforation or abscess without bleeding: Secondary | ICD-10-CM | POA: Diagnosis not present

## 2021-11-03 DIAGNOSIS — M6281 Muscle weakness (generalized): Secondary | ICD-10-CM | POA: Diagnosis not present

## 2021-11-03 DIAGNOSIS — Z7901 Long term (current) use of anticoagulants: Secondary | ICD-10-CM | POA: Diagnosis not present

## 2021-11-03 DIAGNOSIS — E785 Hyperlipidemia, unspecified: Secondary | ICD-10-CM | POA: Diagnosis not present

## 2021-11-03 NOTE — Patient Outreach (Signed)
[facility]) Care Management  11/03/2021  Tara Bradley [birth year] [id number]   Weekly transition of care call placed, member state this is not a good time to talk.  Report daughter is also not available but will have her return call.  If no call by end of business day, will follow up within the next 3-5 business days.  Valente David, RN, MSN, [place] Manager [phone]

## 2021-11-06 DIAGNOSIS — E785 Hyperlipidemia, unspecified: Secondary | ICD-10-CM | POA: Diagnosis not present

## 2021-11-06 DIAGNOSIS — M4186 Other forms of scoliosis, lumbar region: Secondary | ICD-10-CM | POA: Diagnosis not present

## 2021-11-06 DIAGNOSIS — J9610 Chronic respiratory failure, unspecified whether with hypoxia or hypercapnia: Secondary | ICD-10-CM | POA: Diagnosis not present

## 2021-11-06 DIAGNOSIS — Z9012 Acquired absence of left breast and nipple: Secondary | ICD-10-CM | POA: Diagnosis not present

## 2021-11-06 DIAGNOSIS — M6281 Muscle weakness (generalized): Secondary | ICD-10-CM | POA: Diagnosis not present

## 2021-11-06 DIAGNOSIS — Z7901 Long term (current) use of anticoagulants: Secondary | ICD-10-CM | POA: Diagnosis not present

## 2021-11-06 DIAGNOSIS — Z9981 Dependence on supplemental oxygen: Secondary | ICD-10-CM | POA: Diagnosis not present

## 2021-11-06 DIAGNOSIS — R499 Unspecified voice and resonance disorder: Secondary | ICD-10-CM | POA: Diagnosis not present

## 2021-11-06 DIAGNOSIS — M1990 Unspecified osteoarthritis, unspecified site: Secondary | ICD-10-CM | POA: Diagnosis not present

## 2021-11-06 DIAGNOSIS — I152 Hypertension secondary to endocrine disorders: Secondary | ICD-10-CM | POA: Diagnosis not present

## 2021-11-06 DIAGNOSIS — Z9181 History of falling: Secondary | ICD-10-CM | POA: Diagnosis not present

## 2021-11-06 DIAGNOSIS — I358 Other nonrheumatic aortic valve disorders: Secondary | ICD-10-CM | POA: Diagnosis not present

## 2021-11-06 DIAGNOSIS — K5730 Diverticulosis of large intestine without perforation or abscess without bleeding: Secondary | ICD-10-CM | POA: Diagnosis not present

## 2021-11-06 DIAGNOSIS — Z7984 Long term (current) use of oral hypoglycemic drugs: Secondary | ICD-10-CM | POA: Diagnosis not present

## 2021-11-06 DIAGNOSIS — Z853 Personal history of malignant neoplasm of breast: Secondary | ICD-10-CM | POA: Diagnosis not present

## 2021-11-06 DIAGNOSIS — I5032 Chronic diastolic (congestive) heart failure: Secondary | ICD-10-CM | POA: Diagnosis not present

## 2021-11-06 DIAGNOSIS — Z794 Long term (current) use of insulin: Secondary | ICD-10-CM | POA: Diagnosis not present

## 2021-11-06 DIAGNOSIS — Z8781 Personal history of (healed) traumatic fracture: Secondary | ICD-10-CM | POA: Diagnosis not present

## 2021-11-06 DIAGNOSIS — I714 Abdominal aortic aneurysm, without rupture, unspecified: Secondary | ICD-10-CM | POA: Diagnosis not present

## 2021-11-06 DIAGNOSIS — E1159 Type 2 diabetes mellitus with other circulatory complications: Secondary | ICD-10-CM | POA: Diagnosis not present

## 2021-11-06 DIAGNOSIS — G479 Sleep disorder, unspecified: Secondary | ICD-10-CM | POA: Diagnosis not present

## 2021-11-06 DIAGNOSIS — I4891 Unspecified atrial fibrillation: Secondary | ICD-10-CM | POA: Diagnosis not present

## 2021-11-09 DIAGNOSIS — M1990 Unspecified osteoarthritis, unspecified site: Secondary | ICD-10-CM | POA: Diagnosis not present

## 2021-11-09 DIAGNOSIS — I714 Abdominal aortic aneurysm, without rupture, unspecified: Secondary | ICD-10-CM | POA: Diagnosis not present

## 2021-11-09 DIAGNOSIS — Z9981 Dependence on supplemental oxygen: Secondary | ICD-10-CM | POA: Diagnosis not present

## 2021-11-09 DIAGNOSIS — Z9181 History of falling: Secondary | ICD-10-CM | POA: Diagnosis not present

## 2021-11-09 DIAGNOSIS — I5032 Chronic diastolic (congestive) heart failure: Secondary | ICD-10-CM | POA: Diagnosis not present

## 2021-11-09 DIAGNOSIS — M4186 Other forms of scoliosis, lumbar region: Secondary | ICD-10-CM | POA: Diagnosis not present

## 2021-11-09 DIAGNOSIS — Z7984 Long term (current) use of oral hypoglycemic drugs: Secondary | ICD-10-CM | POA: Diagnosis not present

## 2021-11-09 DIAGNOSIS — R499 Unspecified voice and resonance disorder: Secondary | ICD-10-CM | POA: Diagnosis not present

## 2021-11-09 DIAGNOSIS — E1159 Type 2 diabetes mellitus with other circulatory complications: Secondary | ICD-10-CM | POA: Diagnosis not present

## 2021-11-09 DIAGNOSIS — I4891 Unspecified atrial fibrillation: Secondary | ICD-10-CM | POA: Diagnosis not present

## 2021-11-09 DIAGNOSIS — J9610 Chronic respiratory failure, unspecified whether with hypoxia or hypercapnia: Secondary | ICD-10-CM | POA: Diagnosis not present

## 2021-11-09 DIAGNOSIS — Z9012 Acquired absence of left breast and nipple: Secondary | ICD-10-CM | POA: Diagnosis not present

## 2021-11-09 DIAGNOSIS — M6281 Muscle weakness (generalized): Secondary | ICD-10-CM | POA: Diagnosis not present

## 2021-11-09 DIAGNOSIS — K5730 Diverticulosis of large intestine without perforation or abscess without bleeding: Secondary | ICD-10-CM | POA: Diagnosis not present

## 2021-11-09 DIAGNOSIS — E785 Hyperlipidemia, unspecified: Secondary | ICD-10-CM | POA: Diagnosis not present

## 2021-11-09 DIAGNOSIS — Z794 Long term (current) use of insulin: Secondary | ICD-10-CM | POA: Diagnosis not present

## 2021-11-09 DIAGNOSIS — I358 Other nonrheumatic aortic valve disorders: Secondary | ICD-10-CM | POA: Diagnosis not present

## 2021-11-09 DIAGNOSIS — Z853 Personal history of malignant neoplasm of breast: Secondary | ICD-10-CM | POA: Diagnosis not present

## 2021-11-09 DIAGNOSIS — G479 Sleep disorder, unspecified: Secondary | ICD-10-CM | POA: Diagnosis not present

## 2021-11-09 DIAGNOSIS — Z8781 Personal history of (healed) traumatic fracture: Secondary | ICD-10-CM | POA: Diagnosis not present

## 2021-11-09 DIAGNOSIS — I152 Hypertension secondary to endocrine disorders: Secondary | ICD-10-CM | POA: Diagnosis not present

## 2021-11-09 DIAGNOSIS — Z7901 Long term (current) use of anticoagulants: Secondary | ICD-10-CM | POA: Diagnosis not present

## 2021-11-10 ENCOUNTER — Other Ambulatory Visit: Payer: Self-pay | Admitting: *Deleted

## 2021-11-10 NOTE — Patient Outreach (Signed)
[facility]) Care Management Telephonic RN Care Manager Note   11/10/2021 Name:  Tara Bradley MRN:  [id number] DOB:  1922/06/28  Summary: Tara Bradley call placed to member for weekly transition of care, successful.  Denies any urgent concerns, encouraged to contact this care manager with questions.    Subjective: Tara Bradley is an 88 y.o. year old female who is a primary patient of [name], MD. The care management team was consulted for assistance with care management and/or care coordination needs.    Telephonic RN Care Manager completed Telephone Visit today.  Objective:   Medications Reviewed Today     Reviewed by Eppie Gibson (Physician Assistant Certified) on 22/29/79 at 2207  Med List Status: <None>   Medication Order Taking? Sig Documenting Provider Last Dose Status Informant  acetaZOLAMIDE (DIAMOX) 250 MG tablet [id number] Yes Take 1 tablet (250 mg total) by mouth daily. Darreld Mclean, PA-C Taking Active   BD PEN NEEDLE NANO U/F 32G X 4 MM MISC [id number] Yes Inject into the skin daily. [provider] Taking Active Nursing Home Medication Administration Guide (MAG)  Calcium Carbonate-Vitamin D 600-400 MG-UNIT tablet [id number] Yes Take 1 tablet by mouth daily. [provider] Taking Active Nursing Home Medication Administration Guide (MAG)  Cholecalciferol (VITAMIN D) 125 MCG (5000 UT) CAPS [id number] Yes Take 5,000 Units by mouth daily. [provider] Taking Active Nursing Home Medication Administration Guide (MAG)  Dextromethorphan-guaiFENesin 5-100 MG/5ML LIQD [id number] Yes Take 10 mLs by mouth 3 (three) times daily. [provider] Taking Active Nursing Home Medication Administration Guide (MAG)  diltiazem (CARDIZEM CD) 360 MG 24 hr capsule [id number] Yes Take 1 capsule (360 mg total) by mouth daily. Shelly Coss, MD Taking Active Nursing Home Medication Administration Guide (MAG)  eszopiclone  (LUNESTA) 2 MG TABS tablet [id number] Yes Take 2 mg by mouth at bedtime. Take immediately before bedtime [provider] Taking Active Nursing Home Medication Administration Guide (MAG)  ferrous gluconate (FERGON) 324 MG tablet [id number] Yes Take 324 mg by mouth 2 (two) times daily with a meal. [provider] Taking Active Nursing Home Medication Administration Guide (MAG)  furosemide (LASIX) 40 MG tablet [id number] Yes Take 2 tablets (80 mg total) by mouth 2 (two) times daily for 4 days, THEN 1 tablet (40 mg total) 2 (two) times daily. Take extra 40 mg tablet for weight gain of 3 lbs in 1 day or 5 lbs in 1 week. Lavina Hamman, MD Taking Active   gabapentin (NEURONTIN) 100 MG capsule [id number] Yes Take 100 mg by mouth 3 (three) times daily. [provider] Taking Active Nursing Home Medication Administration Guide (MAG)  insulin glargine (LANTUS) 100 UNIT/ML Solostar Pen [id number] Yes Inject 8 Units into the skin daily. [provider] Taking Active Nursing Home Medication Administration Guide (MAG)  insulin lispro (HUMALOG) 100 UNIT/ML injection [id number] Yes Inject 0-12 Units into the skin daily. Sliding scale : If blood sugar less than 70, call NP 70-200 = 0 units 201-250 = 2 units 251-300 = 4 units 301-350 = 6 units 351-400 = 8 units 401-450 = 10 units 451-600 = 12 units If greater than 450 give 12 units. Recheck in 2 hours. If >350 or <100 notify provider. [provider] Taking Active Nursing Home Medication Administration Guide (MAG)  ipratropium-albuterol (DUONEB) 0.5-2.5 (3) MG/3ML SOLN [id number] Yes Take 3 mLs by nebulization 2 (two) times daily. [provider] Taking Active Nursing Home Medication Administration Guide (MAG)  magnesium  oxide (MAG-OX) 400 MG tablet [id number] Yes Take 400 mg by mouth at bedtime. [provider] Taking Active Nursing Home Medication Administration Guide (MAG)  metFORMIN (GLUMETZA) 500 MG (MOD) 24  hr tablet [id number] Yes Take 1,000 mg by mouth every evening. [provider] Taking Active Nursing Home Medication Administration Guide (MAG)  metoprolol succinate (TOPROL-XL) 100 MG 24 hr tablet [id number] Yes Take 1 tablet (100 mg total) by mouth daily. Take with or immediately following a meal. Lavina Hamman, MD Taking Active   metoprolol tartrate (LOPRESSOR) 25 MG tablet [id number] Yes Take 25 mg by mouth as needed. [provider] Taking Active   polyethylene glycol (MIRALAX / GLYCOLAX) 17 g packet [id number] Yes Take 17 g by mouth daily. Shelly Coss, MD Taking Active Nursing Home Medication Administration Guide (MAG)  potassium chloride (KLOR-CON) 10 MEQ tablet [id number] Yes Take 2 tablets (20 mEq total) by mouth daily. Lavina Hamman, MD Taking Active   senna (SENOKOT) 8.6 MG TABS tablet [id number] Yes Take 1 tablet (8.6 mg total) by mouth 2 (two) times daily. Shelly Coss, MD Taking Active Nursing Home Medication Administration Guide (MAG)  tamsulosin [facility]) 0.4 MG CAPS capsule [id number] Yes Take 1 capsule (0.4 mg total) by mouth daily. Shelly Coss, MD Taking Active Nursing Home Medication Administration Guide (MAG)  traZODone (DESYREL) 50 MG tablet [id number] Yes Take 50 mg by mouth at bedtime as needed for sleep. [provider] Taking Active Nursing Home Medication Administration Guide (MAG)  white petrolatum (VASELINE) GEL [id number] Yes Apply 1 application. topically in the morning, at noon, in the evening, and at bedtime. [provider] Taking Active Nursing Home Medication Administration Guide (MAG)  XARELTO 15 MG TABS tablet [id number] Yes TAKE 1 TABLET ONCE DAILY WITH SUPPER.  Patient taking differently: Take 15 mg by mouth daily with supper.   [name], MD Taking Active Nursing Home Medication Administration Guide (MAG)  Med List Note [name], CPhT 09/02/21 1829): [place] .             SDOH:  (Social  Determinants of Health) assessments and interventions performed:     Care Plan  Review of patient past medical history, allergies, medications, health status, including review of consultants reports, laboratory and other test data, was performed as part of comprehensive evaluation for care management services.   Care Plan : [facility] Plan of Care (Adult)  Updates made by Valente David, RN since 11/10/2021 12:00 AM     Problem: Care coordination and education needs related to chronic medical conditions (A-fib, CHF, DM)      Long-Range Goal: Member will show evidence of effectively managing chronic medical conditions (A-fib, CHF, DM)   Start Date: 10/21/2021  Expected End Date: 10/21/2022  This Visit's Progress: On track  Recent Progress: On track  Priority: High  Note:   Current Barriers:  Care Coordination needs related to ADL IADL limitations Chronic Disease Management support and education needs related to Atrial Fibrillation, CHF, and DMII   RNCM Clinical Goal(s):  Patient will verbalize understanding of plan for management of Atrial Fibrillation, CHF, and DMII as evidenced by member's ability to verbalize plan of care take all medications exactly as prescribed and will call provider for medication related questions as evidenced by member's reported adherence attend all scheduled medical appointments: PCP and cardiology as evidenced by attendance record in EMR continue to work with RN Care Manager to address care management and care coordination needs related to  Atrial Fibrillation,  CHF, and DMII as evidenced by adherence to CM Team Scheduled appointments work with home health to regain independence as evidenced by ability to bathe and dress independently  through collaboration with Consulting civil engineer, provider, and care team.   Interventions: Inter-disciplinary care team collaboration (see longitudinal plan of care) Evaluation of current treatment plan related to  self management and  patient's adherence to plan as established by provider   AFIB Interventions: (Status:  Condition stable.  Not addressed this visit.) Long Term Goal   Counseled on increased risk of stroke due to Afib and benefits of anticoagulation for stroke prevention Reviewed importance of adherence to anticoagulant exactly as prescribed   Heart Failure Interventions:  (Status:  Goal on track:  Yes.) Long Term Goal Basic overview and discussion of pathophysiology of Heart Failure reviewed Reviewed Heart Failure Action Plan in depth and provided written copy Advised patient to weigh each morning after emptying bladder  Diabetes Interventions:  (Status:  Goal on track:  Yes.) Long Term Goal Assessed patient's understanding of A1c goal: <7% Reviewed medications with patient and discussed importance of medication adherence Counseled on importance of regular laboratory monitoring as prescribed Discussed plans with patient for ongoing care management follow up and provided patient with direct contact information for care management team Lab Results  Component Value Date   HGBA1C 8.6 (H) 05/03/2021   Patient Goals/Self-Care Activities: call office if I gain more than 2 pounds in one day or 5 pounds in one week keep legs up while sitting track weight in diary check feet daily for cuts, sores or redness enter blood sugar readings and medication or insulin into daily log take the blood sugar log to all doctor visits check pulse (heart) rate once a day make a plan to eat healthy  Follow Up Plan:  The patient has been provided with contact information for the care management team and has been advised to call with any health related questions or concerns.    Update 6/5 - Member state she is no longer having shortness of breath.  Able to decrease oxygen to 2 liters during the day, wearing 4 liters at night.  She will try to wean off during the day to keep oxygen levels greater than 88%.  Weight today was 136  pounds, 3 pound increase from last week, denies swelling.  Blood sugar was 85 today, unsure what her BP and HR were.  Will continue to monitor weights and notify provider with significant gain, will also monitor blood sugar, HR, and BP daily.  She was seen by cardiology on 5/31, no changes to plan of care.  She will follow up with PCP, cardiology, and pulmonology all in August.  Continues to be active with [facility] for PT, OT, and RN as well as private pay aides.  Daughter will plan to go home to [place] next Wednesday, denies need for additional resources at this time.    Update 6/20 - Member report daughter is now back in [place], but state she is doing well with the ongoing assistance of hired aides.  They are coming in daily four about 3-4 hours, helping with ADLs as needed and blood pressure/HR monitoring.  She is weighing daily, state this has been stable.  Blood sugar was 125 this morning.  Also continues to have visits from home health agency to improve strength and independence.          Plan:  Telephone follow up appointment with care management team member scheduled for:  1 week The patient has been provided with contact information for the care management team and has been advised to call with any health related questions or concerns.   Valente David, RN, MSN, [place] Manager [phone]

## 2021-11-12 DIAGNOSIS — I4891 Unspecified atrial fibrillation: Secondary | ICD-10-CM | POA: Diagnosis not present

## 2021-11-12 DIAGNOSIS — Z8781 Personal history of (healed) traumatic fracture: Secondary | ICD-10-CM | POA: Diagnosis not present

## 2021-11-12 DIAGNOSIS — Z7901 Long term (current) use of anticoagulants: Secondary | ICD-10-CM | POA: Diagnosis not present

## 2021-11-12 DIAGNOSIS — M6281 Muscle weakness (generalized): Secondary | ICD-10-CM | POA: Diagnosis not present

## 2021-11-12 DIAGNOSIS — M1990 Unspecified osteoarthritis, unspecified site: Secondary | ICD-10-CM | POA: Diagnosis not present

## 2021-11-12 DIAGNOSIS — Z7984 Long term (current) use of oral hypoglycemic drugs: Secondary | ICD-10-CM | POA: Diagnosis not present

## 2021-11-12 DIAGNOSIS — Z9012 Acquired absence of left breast and nipple: Secondary | ICD-10-CM | POA: Diagnosis not present

## 2021-11-12 DIAGNOSIS — E785 Hyperlipidemia, unspecified: Secondary | ICD-10-CM | POA: Diagnosis not present

## 2021-11-12 DIAGNOSIS — I152 Hypertension secondary to endocrine disorders: Secondary | ICD-10-CM | POA: Diagnosis not present

## 2021-11-12 DIAGNOSIS — I5032 Chronic diastolic (congestive) heart failure: Secondary | ICD-10-CM | POA: Diagnosis not present

## 2021-11-12 DIAGNOSIS — E1159 Type 2 diabetes mellitus with other circulatory complications: Secondary | ICD-10-CM | POA: Diagnosis not present

## 2021-11-12 DIAGNOSIS — Z9181 History of falling: Secondary | ICD-10-CM | POA: Diagnosis not present

## 2021-11-12 DIAGNOSIS — K5730 Diverticulosis of large intestine without perforation or abscess without bleeding: Secondary | ICD-10-CM | POA: Diagnosis not present

## 2021-11-12 DIAGNOSIS — R499 Unspecified voice and resonance disorder: Secondary | ICD-10-CM | POA: Diagnosis not present

## 2021-11-12 DIAGNOSIS — J9610 Chronic respiratory failure, unspecified whether with hypoxia or hypercapnia: Secondary | ICD-10-CM | POA: Diagnosis not present

## 2021-11-12 DIAGNOSIS — M4186 Other forms of scoliosis, lumbar region: Secondary | ICD-10-CM | POA: Diagnosis not present

## 2021-11-12 DIAGNOSIS — Z794 Long term (current) use of insulin: Secondary | ICD-10-CM | POA: Diagnosis not present

## 2021-11-12 DIAGNOSIS — Z853 Personal history of malignant neoplasm of breast: Secondary | ICD-10-CM | POA: Diagnosis not present

## 2021-11-12 DIAGNOSIS — Z9981 Dependence on supplemental oxygen: Secondary | ICD-10-CM | POA: Diagnosis not present

## 2021-11-12 DIAGNOSIS — I358 Other nonrheumatic aortic valve disorders: Secondary | ICD-10-CM | POA: Diagnosis not present

## 2021-11-12 DIAGNOSIS — G479 Sleep disorder, unspecified: Secondary | ICD-10-CM | POA: Diagnosis not present

## 2021-11-12 DIAGNOSIS — I714 Abdominal aortic aneurysm, without rupture, unspecified: Secondary | ICD-10-CM | POA: Diagnosis not present

## 2021-11-18 ENCOUNTER — Other Ambulatory Visit: Payer: Self-pay | Admitting: Cardiology

## 2021-11-20 ENCOUNTER — Other Ambulatory Visit: Payer: Self-pay | Admitting: *Deleted

## 2021-11-20 NOTE — Patient Outreach (Signed)
[facility]) Care Management Telephonic RN Care Manager Note   11/20/2021 Name:  Tara Bradley MRN:  [id number] DOB:  03-Apr-1923  Summary: Weekly transition of care call placed to member, successful.  Denies any urgent concerns, encouraged to contact this care manager with questions.   Subjective: Tara Bradley is an 88 y.o. year old female who is a primary patient of [name], MD. The care management team was consulted for assistance with care management and/or care coordination needs.    Telephonic RN Care Manager completed Telephone Visit today.  Objective:   Medications Reviewed Today     Reviewed by Eppie Gibson (Physician Assistant Certified) on 54/65/03 at 2207  Med List Status: <None>   Medication Order Taking? Sig Documenting Provider Last Dose Status Informant  acetaZOLAMIDE (DIAMOX) 250 MG tablet [id number] Yes Take 1 tablet (250 mg total) by mouth daily. Darreld Mclean, PA-C Taking Active   BD PEN NEEDLE NANO U/F 32G X 4 MM MISC [id number] Yes Inject into the skin daily. [provider] Taking Active Nursing Home Medication Administration Guide (MAG)  Calcium Carbonate-Vitamin D 600-400 MG-UNIT tablet [id number] Yes Take 1 tablet by mouth daily. [provider] Taking Active Nursing Home Medication Administration Guide (MAG)  Cholecalciferol (VITAMIN D) 125 MCG (5000 UT) CAPS [id number] Yes Take 5,000 Units by mouth daily. [provider] Taking Active Nursing Home Medication Administration Guide (MAG)  Dextromethorphan-guaiFENesin 5-100 MG/5ML LIQD [id number] Yes Take 10 mLs by mouth 3 (three) times daily. [provider] Taking Active Nursing Home Medication Administration Guide (MAG)  diltiazem (CARDIZEM CD) 360 MG 24 hr capsule [id number] Yes Take 1 capsule (360 mg total) by mouth daily. Shelly Coss, MD Taking Active Nursing Home Medication Administration Guide (MAG)  eszopiclone (LUNESTA) 2 MG  TABS tablet [id number] Yes Take 2 mg by mouth at bedtime. Take immediately before bedtime [provider] Taking Active Nursing Home Medication Administration Guide (MAG)  ferrous gluconate (FERGON) 324 MG tablet [id number] Yes Take 324 mg by mouth 2 (two) times daily with a meal. [provider] Taking Active Nursing Home Medication Administration Guide (MAG)  furosemide (LASIX) 40 MG tablet [id number] Yes Take 2 tablets (80 mg total) by mouth 2 (two) times daily for 4 days, THEN 1 tablet (40 mg total) 2 (two) times daily. Take extra 40 mg tablet for weight gain of 3 lbs in 1 day or 5 lbs in 1 week. Lavina Hamman, MD Taking Active   gabapentin (NEURONTIN) 100 MG capsule [id number] Yes Take 100 mg by mouth 3 (three) times daily. [provider] Taking Active Nursing Home Medication Administration Guide (MAG)  insulin glargine (LANTUS) 100 UNIT/ML Solostar Pen [id number] Yes Inject 8 Units into the skin daily. [provider] Taking Active Nursing Home Medication Administration Guide (MAG)  insulin lispro (HUMALOG) 100 UNIT/ML injection [id number] Yes Inject 0-12 Units into the skin daily. Sliding scale : If blood sugar less than 70, call NP 70-200 = 0 units 201-250 = 2 units 251-300 = 4 units 301-350 = 6 units 351-400 = 8 units 401-450 = 10 units 451-600 = 12 units If greater than 450 give 12 units. Recheck in 2 hours. If >350 or <100 notify provider. [provider] Taking Active Nursing Home Medication Administration Guide (MAG)  ipratropium-albuterol (DUONEB) 0.5-2.5 (3) MG/3ML SOLN [id number] Yes Take 3 mLs by nebulization 2 (two) times daily. [provider] Taking Active Nursing Home Medication Administration Guide (MAG)  magnesium oxide (MAG-OX) 400  MG tablet [id number] Yes Take 400 mg by mouth at bedtime. [provider] Taking Active Nursing Home Medication Administration Guide (MAG)  metFORMIN (GLUMETZA) 500 MG (MOD) 24 hr tablet  [id number] Yes Take 1,000 mg by mouth every evening. [provider] Taking Active Nursing Home Medication Administration Guide (MAG)  metoprolol succinate (TOPROL-XL) 100 MG 24 hr tablet [id number] Yes Take 1 tablet (100 mg total) by mouth daily. Take with or immediately following a meal. Lavina Hamman, MD Taking Active   metoprolol tartrate (LOPRESSOR) 25 MG tablet [id number] Yes Take 25 mg by mouth as needed. [provider] Taking Active   polyethylene glycol (MIRALAX / GLYCOLAX) 17 g packet [id number] Yes Take 17 g by mouth daily. Shelly Coss, MD Taking Active Nursing Home Medication Administration Guide (MAG)  potassium chloride (KLOR-CON) 10 MEQ tablet [id number] Yes Take 2 tablets (20 mEq total) by mouth daily. Lavina Hamman, MD Taking Active   senna (SENOKOT) 8.6 MG TABS tablet [id number] Yes Take 1 tablet (8.6 mg total) by mouth 2 (two) times daily. Shelly Coss, MD Taking Active Nursing Home Medication Administration Guide (MAG)  tamsulosin [facility]) 0.4 MG CAPS capsule [id number] Yes Take 1 capsule (0.4 mg total) by mouth daily. Shelly Coss, MD Taking Active Nursing Home Medication Administration Guide (MAG)  traZODone (DESYREL) 50 MG tablet [id number] Yes Take 50 mg by mouth at bedtime as needed for sleep. [provider] Taking Active Nursing Home Medication Administration Guide (MAG)  white petrolatum (VASELINE) GEL [id number] Yes Apply 1 application. topically in the morning, at noon, in the evening, and at bedtime. [provider] Taking Active Nursing Home Medication Administration Guide (MAG)  XARELTO 15 MG TABS tablet [id number] Yes TAKE 1 TABLET ONCE DAILY WITH SUPPER.  Patient taking differently: Take 15 mg by mouth daily with supper.   [name], MD Taking Active Nursing Home Medication Administration Guide (MAG)  Med List Note [name], CPhT 09/02/21 1829): [place] .             SDOH:  (Social  Determinants of Health) assessments and interventions performed:     Care Plan  Review of patient past medical history, allergies, medications, health status, including review of consultants reports, laboratory and other test data, was performed as part of comprehensive evaluation for care management services.   Care Plan : [facility] Plan of Care (Adult)  Updates made by Valente David, RN since 11/20/2021 12:00 AM     Problem: Care coordination and education needs related to chronic medical conditions (A-fib, CHF, DM)      Long-Range Goal: Member will show evidence of effectively managing chronic medical conditions (A-fib, CHF, DM) Completed 11/20/2021  Start Date: 10/21/2021  Expected End Date: 10/21/2022  This Visit's Progress: On track  Recent Progress: On track  Priority: High  Note:   Current Barriers:  Care Coordination needs related to ADL IADL limitations Chronic Disease Management support and education needs related to Atrial Fibrillation, CHF, and DMII   RNCM Clinical Goal(s):  Patient will verbalize understanding of plan for management of Atrial Fibrillation, CHF, and DMII as evidenced by member's ability to verbalize plan of care take all medications exactly as prescribed and will call provider for medication related questions as evidenced by member's reported adherence attend all scheduled medical appointments: PCP and cardiology as evidenced by attendance record in EMR continue to work with RN Care Manager to address care management and care coordination needs related to  Atrial Fibrillation, CHF, and  DMII as evidenced by adherence to CM Team Scheduled appointments work with home health to regain independence as evidenced by ability to bathe and dress independently  through collaboration with Consulting civil engineer, provider, and care team.   Interventions: Inter-disciplinary care team collaboration (see longitudinal plan of care) Evaluation of current treatment plan related to  self  management and patient's adherence to plan as established by provider   AFIB Interventions: (Status:  Condition stable.  Not addressed this visit.) Long Term Goal   Counseled on increased risk of stroke due to Afib and benefits of anticoagulation for stroke prevention Reviewed importance of adherence to anticoagulant exactly as prescribed   Heart Failure Interventions:  (Status:  Goal Met.) Long Term Goal Basic overview and discussion of pathophysiology of Heart Failure reviewed Reviewed Heart Failure Action Plan in depth and provided written copy Advised patient to weigh each morning after emptying bladder  Diabetes Interventions:  (Status:  Goal on track:  Yes.) Long Term Goal Assessed patient's understanding of A1c goal: <7% Reviewed medications with patient and discussed importance of medication adherence Counseled on importance of regular laboratory monitoring as prescribed Discussed plans with patient for ongoing care management follow up and provided patient with direct contact information for care management team Lab Results  Component Value Date   HGBA1C 8.6 (H) 05/03/2021   Patient Goals/Self-Care Activities: call office if I gain more than 2 pounds in one day or 5 pounds in one week keep legs up while sitting track weight in diary check feet daily for cuts, sores or redness enter blood sugar readings and medication or insulin into daily log take the blood sugar log to all doctor visits check pulse (heart) rate once a day make a plan to eat healthy  Follow Up Plan:  The patient has been provided with contact information for the care management team and has been advised to call with any health related questions or concerns.    Update 6/5 - Member state she is no longer having shortness of breath.  Able to decrease oxygen to 2 liters during the day, wearing 4 liters at night.  She will try to wean off during the day to keep oxygen levels greater than 88%.  Weight today was  136 pounds, 3 pound increase from last week, denies swelling.  Blood sugar was 85 today, unsure what her BP and HR were.  Will continue to monitor weights and notify provider with significant gain, will also monitor blood sugar, HR, and BP daily.  She was seen by cardiology on 5/31, no changes to plan of care.  She will follow up with PCP, cardiology, and pulmonology all in August.  Continues to be active with [facility] for PT, OT, and RN as well as private pay aides.  Daughter will plan to go home to [place] next Wednesday, denies need for additional resources at this time.    Update 6/20 - Member report daughter is now back in [place], but state she is doing well with the ongoing assistance of hired aides.  They are coming in daily four about 3-4 hours, helping with ADLs as needed and blood pressure/HR monitoring.  She is weighing daily, state this has been stable.  Blood sugar was 125 this morning.  Also continues to have visits from home health agency to improve strength and independence.     Update 6/30 - Member state she is doing well, declines any further assistance.  Private pay aides are in the home daily, blood pressure,  heart rate, weight, and blood sugars have all remained stable.          Plan:  No further follow up required: Denies any further concerns, case closed.  Valente David, RN, MSN, [place] Manager [phone]

## 2021-12-20 ENCOUNTER — Other Ambulatory Visit: Payer: Self-pay | Admitting: Cardiology

## 2021-12-27 NOTE — Progress Notes (Signed)
Cardiology Office Note   Date:  12/28/2021   ID:  Tara Bradley, DOB 12/03/1922, MRN [id number]  PCP:  [name], MD  Cardiologist: Dr. Percival Spanish, 08/15/2017 [name], MD 05/26/2017   Chief Complaint  Patient presents with   Shortness of Breath     History of Present Illness: Tara Bradley is a 88 y.o. female with a history of an aortic thrombosed descending aortic dissection, aortic valve sclerosis and chronic afib on Xarelto.  She was in the hospital in April and we did see her for this.  She had acute on chronic diastolic heart failure.  She was treated with IV Lasix.  She had a 10 L diuresis.  She was sent home on 40 mg of Lasix twice daily.  She was sent home with some increased potassium.  She was on Xarelto for her chronic atrial fibrillation.  She was bradycardic and digoxin was stopped.   She was in the ED on 09/29/2021 for rapid atrial fibrillation and worsening respiratory distress. She was given 2 doses of IV Cardizem in route to the ED with improvement in rates. BNP was elevated at 380 but lower than it was 2 weeks prior. Chest CTA was negative for PE but showed cardiac enlargement, bilateral pleural effusion, and pulmonary edema consistent with CHF.  She is on chronic O2 4 liters.    She is actually been doing better since her last visit.  At times like today she is on 2 L and there using the 4 L at night but they are starting to wean this down.  Her oxygen levels have been maintaining in the 90s when she is on 2 or 1-1/2 L.  She has a little lower extremity swelling.  She gets around with her walker.  She is at home with her aide coming.  She denies any chest pressure, neck or arm discomfort.  She has had no presyncope or syncope.  She had no cough fevers or chills.  She does have no PND or orthopnea.  She reports that her heart rate she thinks is above 100 usually but is not entirely clear that they have been watching this since today it is in the 70s 80s.  It  seems to be controlled.   Past Medical History:  Diagnosis Date   Aortic valve sclerosis    Echo, 2008   Arthritis    Bradycardia    October, 2012   Cancer [facility])    Chronic atrial fibrillation (HCC)    Chronic diastolic CHF (congestive heart failure) ([place])    Chronic insomnia    Closed fracture of unspecified part of femur 2005   Diabetes mellitus, type 2 ([place])    Diverticulitis    Diverticulosis    Hyperlipidemia    Hypertension    Long term (current) use of anticoagulants    Osteopenia    Osteoporosis    femur fracture 2005, pelvic fracture 2006   Personal history of malignant neoplasm of breast    Pneumonia    Syncope    Thoracic aortic aneurysm ([place]) 02/2017   chronic descending aorta dissection   Unspecified closed fracture of pelvis 2006    Past Surgical History:  Procedure Laterality Date   CARDIOVERSION N/A 08/09/2012   Procedure: CARDIOVERSION;  Surgeon: Deboraha Sprang, MD;  Location: [place];  Service: Cardiovascular;  Laterality: N/A;   EYE SURGERY     FEMUR SURGERY  2005   ORIF   INTRAMEDULLARY (IM) NAIL INTERTROCHANTERIC  Left 08/02/2021   Procedure: INTRAMEDULLARY (IM) NAIL INTERTROCHANTRIC;  Surgeon: Willaim Sheng, MD;  Location: [place];  Service: Orthopedics;  Laterality: Left;   IR ANGIOGRAM PELVIS SELECTIVE OR SUPRASELECTIVE  03/11/2017   IR ANGIOGRAM SELECTIVE EACH ADDITIONAL VESSEL  03/11/2017   IR EMBO ART  VEN HEMORR LYMPH EXTRAV  INC GUIDE ROADMAPPING  03/11/2017   IR FLUORO GUIDE CV LINE RIGHT  03/11/2017   IR US GUIDE VASC ACCESS RIGHT  03/11/2017   IR US GUIDE VASC ACCESS RIGHT  03/11/2017   MASTECTOMY  1995   left   MINOR HARDWARE REMOVAL Left 09/01/2020   Procedure: REMOVAL OF HARDWARE LEFT FOREARM;  Surgeon: Milly Jakob, MD;  Location: [place];  Service: Orthopedics;  Laterality: Left;   ORIF ULNAR FRACTURE Left 10/19/2019   Procedure: OPEN REDUCTION INTERNAL FIXATION (ORIF)  LEFT DISTAL RADIUS  AND ULNA  FRACTURE;  Surgeon: Milly Jakob, MD;  Location: [place];  Service: Orthopedics;  Laterality: Left;   REVERSE SHOULDER ARTHROPLASTY Right 05/04/2021   Procedure: REVERSE SHOULDER ARTHROPLASTY;  Surgeon: Netta Cedars, MD;  Location: [place];  Service: Orthopedics;  Laterality: Right;   SHOULDER SURGERY     left   SHOULDER SURGERY  1979, 2003   TONSILLECTOMY     TOTAL ABDOMINAL HYSTERECTOMY W/ BILATERAL SALPINGOOPHORECTOMY  1995   WRIST SURGERY      x 2     Prior to Admission medications   Medication Sig Start Date End Date Taking? Authorizing Provider  BD PEN NEEDLE NANO U/F 32G X 4 MM MISC Inject into the skin daily. 11/25/20  Yes [provider]  Calcium Carbonate-Vitamin D 600-400 MG-UNIT tablet Take 1 tablet by mouth daily.   Yes [provider]  Cholecalciferol (VITAMIN D) 2000 UNITS tablet Take 1,000 Units by mouth daily.   Yes [provider]  diltiazem (CARDIZEM CD) 360 MG 24 hr capsule Take 1 capsule (360 mg total) by mouth daily. 05/12/21  Yes Shelly Coss, MD  furosemide (LASIX) 40 MG tablet Take 1 tablet (40 mg total) by mouth daily. 06/27/21  Yes Phillips Grout, MD  Magnesium Oxide 250 MG TABS Take 250 mg by mouth daily.   Yes [provider]  metFORMIN (GLUMETZA) 500 MG (MOD) 24 hr tablet Take 1,000 mg by mouth every evening.   Yes [provider]  metoprolol succinate (TOPROL-XL) 100 MG 24 hr tablet Take 1 tablet (100 mg total) by mouth 2 (two) times daily. Take with or immediately following a meal. 06/26/21 07/26/21 Yes Phillips Grout, MD  multivitamin-lutein [facility]) CAPS capsule Take 1 capsule by mouth daily.   Yes [provider]  polyethylene glycol (MIRALAX / GLYCOLAX) 17 g packet Take 17 g by mouth daily. 05/12/21  Yes Shelly Coss, MD  potassium chloride (K-DUR) 10 MEQ tablet Take 10 mEq by mouth daily.  03/25/15  Yes [provider]  XARELTO 15 MG TABS tablet TAKE 1 TABLET ONCE DAILY WITH  SUPPER. Patient taking differently: Take 15 mg by mouth daily with supper. 06/22/21  Yes [name], MD      Allergies:   Ambien [zolpidem], Codeine, Codeine sulfate, Dulaglutide, Latex, Morphine sulfate, and Mirtazapine    ROS:      As stated in the HPI and negative for all other systems.    PHYSICAL EXAM: VS:  BP 112/60   Pulse 77   Ht 5' 5.5" (1.664 m)   Wt 143 lb 12.8 oz (65.2 kg)   SpO2  94%   BMI 23.57 kg/m  , BMI Body mass index is 23.57 kg/m. GEN:  No distress NECK:  No jugular venous distention at 90 degrees, waveform within normal limits, carotid upstroke brisk and symmetric, no bruits, no thyromegaly LYMPHATICS:  No cervical adenopathy LUNGS:  Clear to auscultation bilaterally BACK:  No CVA tenderness CHEST:  Unremarkable HEART:  S1 and S2 within normal limits, no S3, no clicks, no rubs, no murmurs, irregular ABD:  Positive bowel sounds normal in frequency in pitch, no bruits, no rebound, no guarding, unable to assess midline mass or bruit with the patient seated. EXT:  2 plus pulses throughout, moderate edema, no cyanosis no clubbing SKIN:  No rashes no nodules NEURO:  Cranial nerves II through XII grossly intact, motor grossly intact throughout PSYCH:  Cognitively intact, oriented to person place and time   EKG:  EKG is not ordered today.    Recent Labs: 08/27/2021: TSH 5.651 09/29/2021: ALT 9; B Natriuretic Peptide 380.1; Magnesium 1.8 10/26/2021: BUN 36; Creatinine, Ser 0.92; Hemoglobin 9.9; Platelets 230; Potassium 4.7; Sodium 141    Lipid Panel    Component Value Date/Time   CHOL 132 03/13/2017 0431   TRIG 72 03/13/2017 0431   HDL 48 03/13/2017 0431   CHOLHDL 2.8 03/13/2017 0431   VLDL 14 03/13/2017 0431   LDLCALC 70 03/13/2017 0431   LDLDIRECT 37 03/10/2009 1106     Wt Readings from Last 3 Encounters:  12/28/21 143 lb 12.8 oz (65.2 kg)  10/21/21 139 lb (63 kg)  10/12/21 141 lb 9.6 oz (64.2 kg)     Other studies Reviewed: Additional  studies/ records that were reviewed today include: Previous office records  ASSESSMENT AND PLAN   Permanent atrial fibrillation:    [name] has a CHA2DS2 - VASc score of 5.  She tolerates anticoagulation.   We talked about tracking her heart rate with her pulse oximeter and they will do this a little more carefully.  At this point no change in therapy.   Aortic aneurysm with chronic descending dissection:     No therapy is indicated because of her advanced age.  HTN:  This is at target.  No change in therapy.   DM: Hemoglobin A1c was 6.5 down from 8.6.  Continue meds as listed.  Hypoxemia:   We talked about weaning down her oxygen and strategies but I do think she is going to continue to need this.   Current medicines are reviewed at length with the patient today.  The patient does not have concerns regarding medicines.    The following changes have been made:   None  Labs/ tests ordered today include: None  No orders of the defined types were placed in this encounter.    Disposition:    Follow up in  [place] in 4 months.      Signed, [name], MD  12/28/2021 11:54 AM    [facility]

## 2021-12-28 ENCOUNTER — Ambulatory Visit (INDEPENDENT_AMBULATORY_CARE_PROVIDER_SITE_OTHER): Payer: Medicare Other | Admitting: Cardiology

## 2021-12-28 ENCOUNTER — Encounter: Payer: Self-pay | Admitting: Cardiology

## 2021-12-28 VITALS — BP 112/60 | HR 77 | Ht 65.5 in | Wt 143.8 lb

## 2021-12-28 DIAGNOSIS — E118 Type 2 diabetes mellitus with unspecified complications: Secondary | ICD-10-CM

## 2021-12-28 DIAGNOSIS — Z794 Long term (current) use of insulin: Secondary | ICD-10-CM

## 2021-12-28 DIAGNOSIS — I712 Thoracic aortic aneurysm, without rupture, unspecified: Secondary | ICD-10-CM | POA: Diagnosis not present

## 2021-12-28 DIAGNOSIS — I5042 Chronic combined systolic (congestive) and diastolic (congestive) heart failure: Secondary | ICD-10-CM

## 2021-12-28 DIAGNOSIS — I10 Essential (primary) hypertension: Secondary | ICD-10-CM

## 2021-12-28 DIAGNOSIS — I482 Chronic atrial fibrillation, unspecified: Secondary | ICD-10-CM | POA: Diagnosis not present

## 2021-12-28 NOTE — Patient Instructions (Signed)
  Follow-Up: At [facility], you and your health needs are our priority.  As part of our continuing mission to provide you with exceptional heart care, we have created designated Provider Care Teams.  These Care Teams include your primary Cardiologist (physician) and Advanced Practice Providers (APPs -  Physician Assistants and Nurse Practitioners) who all work together to provide you with the care you need, when you need it.  We recommend signing up for the patient portal called "MyChart".  Sign up information is provided on this After Visit Summary.  MyChart is used to connect with patients for Virtual Visits (Telemedicine).  Patients are able to view lab/test results, encounter notes, upcoming appointments, etc.  Non-urgent messages can be sent to your provider as well.   To learn more about what you can do with MyChart, go to [email or web address].    Your next appointment:   4 month(s)  The format for your next appointment:   In Person  Provider:  Sande Rives PA_C {

## 2022-01-15 ENCOUNTER — Ambulatory Visit (INDEPENDENT_AMBULATORY_CARE_PROVIDER_SITE_OTHER): Payer: Medicare Other

## 2022-01-15 ENCOUNTER — Encounter: Payer: Self-pay | Admitting: Pulmonary Disease

## 2022-01-15 ENCOUNTER — Ambulatory Visit (INDEPENDENT_AMBULATORY_CARE_PROVIDER_SITE_OTHER): Payer: Medicare Other | Admitting: Pulmonary Disease

## 2022-01-15 VITALS — BP 136/80 | HR 66 | Ht 65.5 in | Wt 146.2 lb

## 2022-01-15 DIAGNOSIS — J90 Pleural effusion, not elsewhere classified: Secondary | ICD-10-CM | POA: Diagnosis not present

## 2022-01-15 DIAGNOSIS — J9611 Chronic respiratory failure with hypoxia: Secondary | ICD-10-CM

## 2022-01-15 NOTE — Patient Instructions (Signed)
Chronic hypoxemic respiratory failure - improved effusion Improved O2 requirement but continues to require O2 ORDER CT Chest without contrast CONTINUE supplemental oxygen for 2L with activity and sleep. Goal O2 >88% At next visit, walk with POC to qualify  Follow-up with me in 1 month

## 2022-01-15 NOTE — Progress Notes (Signed)
Subjective:   PATIENT ID: Tara Bradley GENDER: female DOB: Oct 05, 1922, MRN: [id number]   HPI  Chief Complaint  Patient presents with   Follow-up    Wants to come off o2    Reason for Visit: Follow-up  Tara Bradley is a 88 year old female never smoker with chronic diastolic heart disease, atrial fibrillation, aortic aneurysm with descending dissection, aortic valve scelrosis, HTN, HLD, DM2 and recent left hip fracture who presents for follow-up. Caregiver present.  Initial consult She has recently had heart failure exacerbation requiring aggressive diuresis since her hip surgery. Currently she reports her heart failure is under better control and denies any shortness of breath, cough or wheezing. Improved leg swelling. Her daughter reports that she use to struggle while getting dressed and this has improved. Her dyspnea worsens when off oxygen. She was seen by Cardiology this month with Dr. Percival Spanish and due to her new O2 requirement since hip surgery (07/2021) she was referred to Pulmonary to rule out chronic pulmonary disease. She has completed CT Chest and CTA which demonstrated findings consistent with heart failure.  01/15/22 On her last visit she was being treated for active heart failure exacerbation status post hip surgery.  CT chest with evidence of volume overload with question of underlying chronic lung disease. She reports that her diuretics have been increased and denies shortness of breath, cough or wheezing. She is worried about risk of kidney injury with diuretics but will plan to contact her PCP.  Past Medical History:  Diagnosis Date   Aortic valve sclerosis    Echo, 2008   Arthritis    Bradycardia    October, 2012   Cancer [facility])    Chronic atrial fibrillation (HCC)    Chronic diastolic CHF (congestive heart failure) (HCC)    Chronic insomnia    Closed fracture of unspecified part of femur 2005   Diabetes mellitus, type 2 ([place])    Diverticulitis     Diverticulosis    Hyperlipidemia    Hypertension    Long term (current) use of anticoagulants    Osteopenia    Osteoporosis    femur fracture 2005, pelvic fracture 2006   Personal history of malignant neoplasm of breast    Pneumonia    Syncope    Thoracic aortic aneurysm ([place]) 02/2017   chronic descending aorta dissection   Unspecified closed fracture of pelvis 2006     Family History  Problem Relation Age of Onset   Congestive Heart Failure Mother 68   Heart attack Father 78   Diabetes type II Father    Diabetes type II Brother        1/2   Multiple myeloma Brother        2/2   Diabetes type II Son    Colon cancer Neg Hx      Social History   Occupational History   Occupation: English as a second language teacher, retired  Tobacco Use   Smoking status: Never   Smokeless tobacco: Never  Scientific laboratory technician Use: Never used  Substance and Sexual Activity   Alcohol use: No   Drug use: No   Sexual activity: Not Currently    Allergies  Allergen Reactions   Ambien [Zolpidem] Other (See Comments)    hallucinations   Codeine Other (See Comments)    headache    Codeine Sulfate Other (See Comments)    REACTION: unspecified   Dulaglutide Diarrhea   Latex Hives   Morphine Sulfate Nausea And  Vomiting   Mirtazapine Palpitations     Outpatient Medications Prior to Visit  Medication Sig Dispense Refill   BD PEN NEEDLE NANO U/F 32G X 4 MM MISC Inject into the skin daily.     Calcium Carbonate-Vitamin D 600-400 MG-UNIT tablet Take 1 tablet by mouth daily.     Cholecalciferol (VITAMIN D) 125 MCG (5000 UT) CAPS Take 5,000 Units by mouth daily.     diltiazem (CARDIZEM CD) 360 MG 24 hr capsule Take 1 capsule (360 mg total) by mouth daily.     eszopiclone (LUNESTA) 2 MG TABS tablet Take 2 mg by mouth at bedtime. Take immediately before bedtime     ferrous gluconate (FERGON) 324 MG tablet Take 324 mg by mouth 2 (two) times daily with a meal.     furosemide (LASIX) 80 MG tablet TAKE ONE TABLET  TWICE DAILY AT 8am AND 9pm FOR edema/chf 90 tablet 3   gabapentin (NEURONTIN) 100 MG capsule Take 100 mg by mouth 3 (three) times daily.     insulin glargine (LANTUS) 100 UNIT/ML Solostar Pen Inject 8 Units into the skin daily.     insulin lispro (HUMALOG) 100 UNIT/ML injection Inject 0-12 Units into the skin daily. Sliding scale : If blood sugar less than 70, call NP 70-200 = 0 units 201-250 = 2 units 251-300 = 4 units 301-350 = 6 units 351-400 = 8 units 401-450 = 10 units 451-600 = 12 units If greater than 450 give 12 units. Recheck in 2 hours. If >350 or <100 notify provider.     magnesium oxide (MAG-OX) 400 MG tablet Take 400 mg by mouth at bedtime.     metFORMIN (GLUMETZA) 500 MG (MOD) 24 hr tablet Take 1,000 mg by mouth every evening.     metoprolol succinate (TOPROL-XL) 100 MG 24 hr tablet TAKE ONE TABLET BY MOUTH EVERY MORNING 30 tablet 2   metoprolol tartrate (LOPRESSOR) 25 MG tablet Take 25 mg by mouth as needed.     polyethylene glycol (MIRALAX / GLYCOLAX) 17 g packet Take 17 g by mouth daily. 14 each 0   potassium chloride (KLOR-CON) 10 MEQ tablet Take 2 tablets (20 mEq total) by mouth daily. 30 tablet 0   potassium chloride SA (KLOR-CON M) 20 MEQ tablet TAKE TWO TABLETS EVERY EVENING AT FIVE 90 tablet 3   senna (SENOKOT) 8.6 MG TABS tablet Take 1 tablet (8.6 mg total) by mouth 2 (two) times daily. 120 tablet 0   white petrolatum (VASELINE) GEL Apply 1 application. topically in the morning, at noon, in the evening, and at bedtime.     XARELTO 15 MG TABS tablet TAKE 1 TABLET ONCE DAILY WITH SUPPER. (Patient taking differently: Take 15 mg by mouth daily with supper.) 90 tablet 3   acetaZOLAMIDE (DIAMOX) 250 MG tablet TAKE ONE TABLET BY MOUTH DAILY 30 tablet 0   Dextromethorphan-guaiFENesin 5-100 MG/5ML LIQD Take 10 mLs by mouth 3 (three) times daily.     furosemide (LASIX) 40 MG tablet Take 2 tablets (80 mg total) by mouth 2 (two) times daily for 4 days, THEN 1 tablet (40 mg total)  2 (two) times daily. Take extra 40 mg tablet for weight gain of 3 lbs in 1 day or 5 lbs in 1 week. 60 tablet 0   ipratropium-albuterol (DUONEB) 0.5-2.5 (3) MG/3ML SOLN Take 3 mLs by nebulization 2 (two) times daily.     tamsulosin (FLOMAX) 0.4 MG CAPS capsule TAKE ONE CAPSULE BY MOUTH IN THE MORNING 90 capsule 3  traZODone (DESYREL) 50 MG tablet Take 50 mg by mouth at bedtime as needed for sleep.     No facility-administered medications prior to visit.    Review of Systems  Constitutional:  Negative for chills, diaphoresis, fever, malaise/fatigue and weight loss.  HENT:  Negative for congestion.   Respiratory:  Negative for cough, hemoptysis, sputum production, shortness of breath and wheezing.   Cardiovascular:  Negative for chest pain, palpitations and leg swelling.     Objective:   Vitals:   01/15/22 1003  BP: 136/80  Pulse: 66  SpO2: 100%  Weight: 146 lb 3.2 oz (66.3 kg)  Height: 5' 5.5" (1.664 m)   SpO2: 100 % O2 Device: Nasal cannula O2 Flow Rate (L/min): 2 L/min O2 Type: Continuous O2  Physical Exam: General: Elderly and frail-appearing, no acute distress HENT: [place], AT Eyes: EOMI, no scleral icterus Respiratory: Diminished but clear to auscultation bilaterally.  No crackles, wheezing or rales Cardiovascular: RRR, -M/R/G, no JVD Extremities:-Edema,-tenderness Neuro: AAO x4, CNII-XII grossly intact Psych: Normal mood, normal affect   Data Reviewed:  Imaging: CT Chest HR 09/13/21 - Moderate bilateral pleural effusions with atelectasis. Ground glass airspace opacities in upper lungs.  CTA 09/29/21 - No pulmonary embolism. Bilateral pleural effusions and pulmonary edema. Cardiomegaly CXR 10/12/21 - Bilateral pleural effusions L>R CXR 01/15/22 - Resolved bilateral pleural effusions. Chronic interstitial changes with scattered atelectasis  PFT: None on file  Labs: CBC    Component Value Date/Time   WBC 4.9 10/26/2021 1531   WBC 6.7 09/29/2021 1104   RBC 3.63 (L)  10/26/2021 1531   RBC 3.32 (L) 09/29/2021 1104   HGB 9.9 (L) 10/26/2021 1531   HCT 32.3 (L) 10/26/2021 1531   PLT 230 10/26/2021 1531   MCV 89 10/26/2021 1531   MCH 27.3 10/26/2021 1531   MCH 29.8 09/29/2021 1104   MCHC 30.7 (L) 10/26/2021 1531   MCHC 30.9 09/29/2021 1104   RDW 14.2 10/26/2021 1531   LYMPHSABS 0.5 (L) 09/29/2021 1104   MONOABS 0.6 09/29/2021 1104   EOSABS 0.2 09/29/2021 1104   BASOSABS 0.1 09/29/2021 1104   Ambulatory O2 01/15/22 Resting 97% Ambulatory O2 84% Ambulatory O2 on 2L 92% For full details, see patient care coordination note     Assessment & Plan:   Discussion: 88 year old female never smoker with chronic diastolic heart disease, atrial fibrillation, aortic aneurysm with descending dissection, aortic valve sclerosis, HTN, HLD, DM2 and hx left hip fracture who presents for follow-up. Euvolemic on exam. Improved CXR findings and O2 requirement however continues to need supplemental O2. Will further evaluate for parenchymal abnormalities now that heart failure better controlled.  Chronic hypoxemic respiratory failure - improved effusions Improved O2 requirement but continues to require O2 ORDER CT Chest without contrast CONTINUE supplemental oxygen for 2L with activity and sleep. Goal O2 >88% At next visit, walk with POC to qualify  Health Maintenance Immunization History  Administered Date(s) Administered   Fluad Quad(high Dose 65+) 03/09/2019   Influenza Split 03/19/2011, 03/27/2012   Influenza Whole 04/07/2007, 02/23/2008, 02/26/2009   Influenza-Unspecified 02/21/2013   Tdap 10/19/2019, 09/26/2020   CT Lung Screen - not qualified. Never smoker  Orders Placed This Encounter  Procedures   DG Chest 2 View    Standing Status:   Future    Number of Occurrences:   1    Standing Expiration Date:   01/16/2023    Order Specific Question:   Reason for Exam (SYMPTOM  OR DIAGNOSIS REQUIRED)  Answer:   pleural effusion    Order Specific Question:    Preferred imaging location?    Answer:   Internal   CT Chest Wo Contrast    Standing Status:   Future    Standing Expiration Date:   01/16/2023    Scheduling Instructions:     To be completed before office visit    Order Specific Question:   Preferred imaging location?    Answer:   [id number]  No orders of the defined types were placed in this encounter.   Return in about 1 month (around 02/15/2022).  I have spent a total time of 39-minutes on the day of the appointment including chart review, data review, collecting history, coordinating care and discussing medical diagnosis and plan with the patient/family. Past medical history, allergies, medications were reviewed. Pertinent imaging, labs and tests included in this note have been reviewed and interpreted independently by me.  [place], MD [facility] 01/15/2022  Office Number [phone]

## 2022-01-18 ENCOUNTER — Other Ambulatory Visit: Payer: Self-pay | Admitting: Internal Medicine

## 2022-01-18 DIAGNOSIS — M8580 Other specified disorders of bone density and structure, unspecified site: Secondary | ICD-10-CM

## 2022-01-19 ENCOUNTER — Telehealth: Payer: Self-pay | Admitting: Cardiology

## 2022-01-19 ENCOUNTER — Other Ambulatory Visit: Payer: Self-pay | Admitting: Internal Medicine

## 2022-01-19 DIAGNOSIS — M810 Age-related osteoporosis without current pathological fracture: Secondary | ICD-10-CM

## 2022-01-19 NOTE — Telephone Encounter (Signed)
*  STAT* If patient is at the pharmacy, call can be transferred to refill team.   1. Which medications need to be refilled? (please list name of each medication and dose if known) need new prescriptions- changing pharmacy-  Furosemide, [place] Con   2. Which pharmacy/location (including street and city if local pharmacy) is medication to be sent to?[facility]  3. Do they need a 30 day or 90 day supply? 90 days and refills

## 2022-01-20 ENCOUNTER — Encounter: Payer: Self-pay | Admitting: Podiatry

## 2022-01-20 ENCOUNTER — Ambulatory Visit (INDEPENDENT_AMBULATORY_CARE_PROVIDER_SITE_OTHER): Payer: Medicare Other | Admitting: Podiatry

## 2022-01-20 DIAGNOSIS — B351 Tinea unguium: Secondary | ICD-10-CM | POA: Diagnosis not present

## 2022-01-20 DIAGNOSIS — D689 Coagulation defect, unspecified: Secondary | ICD-10-CM | POA: Diagnosis not present

## 2022-01-20 DIAGNOSIS — E118 Type 2 diabetes mellitus with unspecified complications: Secondary | ICD-10-CM

## 2022-01-20 DIAGNOSIS — M79675 Pain in left toe(s): Secondary | ICD-10-CM | POA: Diagnosis not present

## 2022-01-20 DIAGNOSIS — M79674 Pain in right toe(s): Secondary | ICD-10-CM

## 2022-01-20 MED ORDER — POTASSIUM CHLORIDE ER 10 MEQ PO TBCR: 20.0000 meq | EXTENDED_RELEASE_TABLET | Freq: Every day | ORAL | 3 refills | Status: DC

## 2022-01-20 NOTE — Progress Notes (Signed)
This patient returns to my office for at risk foot care.  This patient requires this care by a professional since this patient will be at risk due to having coagulation defect and diabetes.  Patient is taking eliquis.   This patient is unable to cut nails herself since the patient cannot reach her nails.These nails are painful walking and wearing shoes. She presents to the office with her daughter and has shoulder and left hip surgery. This patient presents for at risk foot care today.  General Appearance  Alert, conversant and in no acute stress.  Vascular  Dorsalis pedis and posterior tibial  pulses are palpable  bilaterally.  Capillary return is within normal limits  bilaterally. Temperature is within normal limits  bilaterally.  Neurologic  Senn-Weinstein monofilament wire test within normal limits  bilaterally. Muscle power within normal limits bilaterally.  Nails Thick disfigured discolored nails with subungual debris  from hallux to fifth toes bilaterally. No evidence of bacterial infection or drainage bilaterally.  Orthopedic  No limitations of motion  feet .  No crepitus or effusions noted.  No bony pathology or digital deformities noted. Hammer toes  B/L.  Skin  normotropic skin with no porokeratosis noted bilaterally.  No signs of infections or ulcers noted.   Contusion noted 2nd  MPJ right foot.  Minimal swelling.  Onychomycosis  Pain in right toes  Pain in left toes  Consent was obtained for treatment procedures.   Mechanical debridement of nails 1-5  bilaterally performed with a nail nipper.  Filed with dremel without incident.    Return office visit    3 months                  Told patient to return for periodic foot care and evaluation due to potential at risk complications.   Gardiner Barefoot DPM

## 2022-01-28 ENCOUNTER — Other Ambulatory Visit: Payer: Self-pay | Admitting: Cardiology

## 2022-02-05 ENCOUNTER — Telehealth: Payer: Self-pay | Admitting: Cardiology

## 2022-02-05 NOTE — Telephone Encounter (Signed)
Left message for patient to return the call.

## 2022-02-05 NOTE — Telephone Encounter (Signed)
  Pt c/o medication issue:  1. Name of Medication: furosemide (LASIX) 80 MG tablet  2. How are you currently taking this medication (dosage and times per day)? TAKE ONE TABLET TWICE DAILY AT 8am AND 9pm FOR edema/chf  3. Are you having a reaction (difficulty breathing--STAT)? No   4. What is your medication issue? Pt said, she is having trouble getting her cellulitis under control. She was told by her doctor to call Dr. Percival Spanish to ask what she needs to do with her lasix to help with her cellulitis

## 2022-02-08 ENCOUNTER — Telehealth: Payer: Self-pay | Admitting: Cardiology

## 2022-02-08 NOTE — Telephone Encounter (Signed)
Pt wanted to make provider aware that she has Cellulitis in both legs and is currently on an antibiotic. Please advise

## 2022-02-08 NOTE — Telephone Encounter (Signed)
Patient wanted Dr. Percival Spanish to know that her legs have been edematous for months. She is under treatment for cellulitis and weeping. She was started on doxycycline and is now on cephalexin.

## 2022-02-09 NOTE — Telephone Encounter (Signed)
Left message for patient with Dr Hochrein's recommendations.

## 2022-02-10 ENCOUNTER — Ambulatory Visit (HOSPITAL_COMMUNITY)
Admission: RE | Admit: 2022-02-10 | Discharge: 2022-02-10 | Disposition: A | Discharge status: Home / Self Care | Payer: Medicare Other | Source: Ambulatory Visit | Attending: Pulmonary Disease | Admitting: Pulmonary Disease

## 2022-02-10 DIAGNOSIS — J9611 Chronic respiratory failure with hypoxia: Secondary | ICD-10-CM | POA: Diagnosis present

## 2022-02-11 NOTE — Telephone Encounter (Signed)
See 9/18 telephone note

## 2022-02-15 ENCOUNTER — Ambulatory Visit (INDEPENDENT_AMBULATORY_CARE_PROVIDER_SITE_OTHER): Payer: Medicare Other | Admitting: Pulmonary Disease

## 2022-02-15 ENCOUNTER — Encounter: Payer: Self-pay | Admitting: Pulmonary Disease

## 2022-02-15 VITALS — BP 102/60 | HR 85 | Ht 65.5 in | Wt 143.8 lb

## 2022-02-15 DIAGNOSIS — J9611 Chronic respiratory failure with hypoxia: Secondary | ICD-10-CM

## 2022-02-15 NOTE — Progress Notes (Signed)
Subjective:   PATIENT ID: Tara Bradley GENDER: female DOB: [birth year], MRN: [id number]   HPI  Chief Complaint  Patient presents with   Follow-up    CT results Currently has cellulitis     Reason for Visit: Follow-up  Tara Bradley is a 88 year old female never smoker with chronic diastolic heart disease, atrial fibrillation, aortic aneurysm with descending dissection, aortic valve scelrosis, HTN, HLD, DM2 and recent left hip fracture who presents for follow-up. Caregiver present.  Initial consult She has recently had heart failure exacerbation requiring aggressive diuresis since her hip surgery. Currently she reports her heart failure is under better control and denies any shortness of breath, cough or wheezing. Improved leg swelling. Her daughter reports that she use to struggle while getting dressed and this has improved. Her dyspnea worsens when off oxygen. She was seen by Cardiology this month with Dr. Percival Spanish and due to her new O2 requirement since hip surgery (07/2021) she was referred to Pulmonary to rule out chronic pulmonary disease. She has completed CT Chest and CTA which demonstrated findings consistent with heart failure.  01/15/22 On her last visit she was being treated for active heart failure exacerbation status post hip surgery.  CT chest with evidence of volume overload with question of underlying chronic lung disease. She reports that her diuretics have been increased and denies shortness of breath, cough or wheezing. She is worried about risk of kidney injury with diuretics but will plan to contact her PCP.  02/15/22 Presents with caregiver. Denies shortness of breath, cough and wheezing. Reports her breathing has improved and once forgot her oxygen during an outing and felt fine. Has not been ambulatory due to recent cellulitis over the last two weeks and currently on her second round of antibiotics. Reports her leg swelling is improved compared to when we last  saw each other.  Past Medical History:  Diagnosis Date   Aortic valve sclerosis    Echo, 2008   Arthritis    Bradycardia    October, 2012   Cancer [facility])    Chronic atrial fibrillation (HCC)    Chronic diastolic CHF (congestive heart failure) ([place])    Chronic insomnia    Closed fracture of unspecified part of femur 2005   Diabetes mellitus, type 2 ([place])    Diverticulitis    Diverticulosis    Hyperlipidemia    Hypertension    Long term (current) use of anticoagulants    Osteopenia    Osteoporosis    femur fracture 2005, pelvic fracture 2006   Personal history of malignant neoplasm of breast    Pneumonia    Syncope    Thoracic aortic aneurysm ([place]) 02/2017   chronic descending aorta dissection   Unspecified closed fracture of pelvis 2006     Family History  Problem Relation Age of Onset   Congestive Heart Failure Mother 41   Heart attack Father 35   Diabetes type II Father    Diabetes type II Brother        1/2   Multiple myeloma Brother        2/2   Diabetes type II Son    Colon cancer Neg Hx      Social History   Occupational History   Occupation: English as a second language teacher, retired  Tobacco Use   Smoking status: Never   Smokeless tobacco: Never  Vaping Use   Vaping Use: Never used  Substance and Sexual Activity   Alcohol use: No  Drug use: No   Sexual activity: Not Currently    Allergies  Allergen Reactions   Ambien [Zolpidem] Other (See Comments)    hallucinations   Codeine Other (See Comments)    headache    Codeine Sulfate Other (See Comments)    REACTION: unspecified   Dulaglutide Diarrhea   Latex Hives   Morphine Sulfate Nausea And Vomiting   Mirtazapine Palpitations     Outpatient Medications Prior to Visit  Medication Sig Dispense Refill   acetaZOLAMIDE (DIAMOX) 250 MG tablet TAKE ONE TABLET BY MOUTH DAILY 30 tablet 0   BD PEN NEEDLE NANO U/F 32G X 4 MM MISC Inject into the skin daily.     Calcium Carbonate-Vitamin D 600-400 MG-UNIT tablet Take  1 tablet by mouth daily.     Cholecalciferol (VITAMIN D) 125 MCG (5000 UT) CAPS Take 5,000 Units by mouth daily.     diltiazem (CARDIZEM CD) 360 MG 24 hr capsule Take 1 capsule (360 mg total) by mouth daily.     eszopiclone (LUNESTA) 2 MG TABS tablet Take 2 mg by mouth at bedtime. Take immediately before bedtime     ferrous gluconate (FERGON) 324 MG tablet Take 324 mg by mouth 2 (two) times daily with a meal.     furosemide (LASIX) 40 MG tablet Take 2 tablets (80 mg total) by mouth 2 (two) times daily for 4 days, THEN 1 tablet (40 mg total) 2 (two) times daily. Take extra 40 mg tablet for weight gain of 3 lbs in 1 day or 5 lbs in 1 week. 60 tablet 0   furosemide (LASIX) 80 MG tablet TAKE ONE TABLET TWICE DAILY AT 8am AND 9pm FOR edema/chf 90 tablet 3   gabapentin (NEURONTIN) 100 MG capsule Take 100 mg by mouth 3 (three) times daily.     insulin glargine (LANTUS) 100 UNIT/ML Solostar Pen Inject 8 Units into the skin daily.     insulin lispro (HUMALOG) 100 UNIT/ML injection Inject 0-12 Units into the skin daily. Sliding scale : If blood sugar less than 70, call NP 70-200 = 0 units 201-250 = 2 units 251-300 = 4 units 301-350 = 6 units 351-400 = 8 units 401-450 = 10 units 451-600 = 12 units If greater than 450 give 12 units. Recheck in 2 hours. If >350 or <100 notify provider.     ipratropium-albuterol (DUONEB) 0.5-2.5 (3) MG/3ML SOLN Take 3 mLs by nebulization 2 (two) times daily.     magnesium oxide (MAG-OX) 400 MG tablet Take 400 mg by mouth at bedtime.     metFORMIN (GLUMETZA) 500 MG (MOD) 24 hr tablet Take 1,000 mg by mouth every evening.     metoprolol succinate (TOPROL-XL) 100 MG 24 hr tablet TAKE ONE TABLET BY MOUTH EVERY MORNING 30 tablet 2   metoprolol tartrate (LOPRESSOR) 25 MG tablet Take 25 mg by mouth as needed.     polyethylene glycol (MIRALAX / GLYCOLAX) 17 g packet Take 17 g by mouth daily. 14 each 0   potassium chloride (KLOR-CON) 10 MEQ tablet Take 2 tablets (20 mEq total) by  mouth daily. 90 tablet 3   potassium chloride SA (KLOR-CON M) 20 MEQ tablet TAKE TWO TABLETS EVERY EVENING AT FIVE 90 tablet 3   senna (SENOKOT) 8.6 MG TABS tablet Take 1 tablet (8.6 mg total) by mouth 2 (two) times daily. 120 tablet 0   tamsulosin (FLOMAX) 0.4 MG CAPS capsule TAKE ONE CAPSULE BY MOUTH IN THE MORNING 90 capsule 3   traZODone (DESYREL)  50 MG tablet Take 50 mg by mouth at bedtime as needed for sleep.     white petrolatum (VASELINE) GEL Apply 1 application. topically in the morning, at noon, in the evening, and at bedtime.     XARELTO 15 MG TABS tablet TAKE 1 TABLET ONCE DAILY WITH SUPPER. (Patient taking differently: Take 15 mg by mouth daily with supper.) 90 tablet 3   No facility-administered medications prior to visit.    Review of Systems  Constitutional:  Negative for chills, diaphoresis, fever, malaise/fatigue and weight loss.  HENT:  Negative for congestion.   Respiratory:  Negative for cough, hemoptysis, sputum production, shortness of breath and wheezing.   Cardiovascular:  Positive for leg swelling. Negative for chest pain and palpitations.     Objective:   Vitals:   02/15/22 1108  BP: 102/60  Pulse: 85  SpO2: 94%  Weight: 143 lb 12.8 oz (65.2 kg)  Height: 5' 5.5" (1.664 m)   SpO2: 94 % O2 Device: None (Room air)  Physical Exam: General: Elderly-appearing, no acute distress HENT: [place], AT Eyes: EOMI, no scleral icterus Respiratory: Clear to auscultation bilaterally.  No crackles, wheezing or rales Cardiovascular: RRR, -M/R/G, no JVD Neuro: AAO x4, CNII-XII grossly intact Psych: Normal mood, normal affect Extremities 1+pitting edema, mildly warm to touch, non-erythematous, non-tender  Data Reviewed:  Imaging: CT Chest HR 09/13/21 - Moderate bilateral pleural effusions with atelectasis. Ground glass airspace opacities in upper lungs.  CTA 09/29/21 - No pulmonary embolism. Bilateral pleural effusions and pulmonary edema. Cardiomegaly CXR 10/12/21 -  Bilateral pleural effusions L>R CXR 01/15/22 - Resolved bilateral pleural effusions. Chronic interstitial changes with scattered atelectasis CT Chest 02/10/22 - Improved bilateral small pleural effusions and resolution of GGO. Scoliosis  PFT: None on file  Labs: CBC    Component Value Date/Time   WBC 4.9 10/26/2021 1531   WBC 6.7 09/29/2021 1104   RBC 3.63 (L) 10/26/2021 1531   RBC 3.32 (L) 09/29/2021 1104   HGB 9.9 (L) 10/26/2021 1531   HCT 32.3 (L) 10/26/2021 1531   PLT 230 10/26/2021 1531   MCV 89 10/26/2021 1531   MCH 27.3 10/26/2021 1531   MCH 29.8 09/29/2021 1104   MCHC 30.7 (L) 10/26/2021 1531   MCHC 30.9 09/29/2021 1104   RDW 14.2 10/26/2021 1531   LYMPHSABS 0.5 (L) 09/29/2021 1104   MONOABS 0.6 09/29/2021 1104   EOSABS 0.2 09/29/2021 1104   BASOSABS 0.1 09/29/2021 1104   Ambulatory O2 01/15/22 Resting 97% Ambulatory O2 84% Ambulatory O2 on 2L 92% For full details, see patient care coordination note     Assessment & Plan:   Discussion: 88 year old female never smoker with chronic diastolic heart disease, atrial fibrillation, aortic aneurysm with descending dissection, aortic valve scelroris, HTN, HLD, DM2, and hx left hip fracture who presents for follow-up. LE edema present. Improved CT findings, no parenchymal abnormalities. O2 requirement evaluated and borderline. Counseled on PRN oxygen needs. Recommended against POC as she will hopefully will be able to wean off O2 at next visit.  Chronic hypoxemic respiratory failure - improved clinical and radiographically --Reviewed CT with improved CT. No parenchymal abnormalities --Ambulatory O2 performed in clinic today. [place] SpO2 88% --Continue oxygen for goal >88%  Health Maintenance Immunization History  Administered Date(s) Administered   Fluad Quad(high Dose 65+) 03/09/2019   Influenza Split 03/19/2011, 03/27/2012   Influenza Whole 04/07/2007, 02/23/2008, 02/26/2009   Influenza-Unspecified 02/21/2013   Tdap  10/19/2019, 09/26/2020   CT Lung Screen - not qualified.  Never smoker  No orders of the defined types were placed in this encounter. No orders of the defined types were placed in this encounter.   Return in about 2 months (around 04/17/2022).  I have spent a total time of 39-minutes on the day of the appointment including chart review, data review, collecting history, coordinating care and discussing medical diagnosis and plan with the patient/family. Past medical history, allergies, medications were reviewed. Pertinent imaging, labs and tests included in this note have been reviewed and interpreted independently by me.  [place], MD [facility] 02/15/2022  Office Number [phone]

## 2022-02-15 NOTE — Patient Instructions (Addendum)
Chronic hypoxemic respiratory failure - improved clinical and radiographically --Reviewed CT with improved CT. No parenchymal abnormalities --Ambulatory O2 performed in clinic today. [place] SpO2 88% --Continue oxygen for goal >88%  Follow-up with me in 2 months for ambulatory O2

## 2022-03-10 ENCOUNTER — Other Ambulatory Visit: Payer: Self-pay | Admitting: Cardiology

## 2022-03-18 ENCOUNTER — Inpatient Hospital Stay (HOSPITAL_COMMUNITY): Payer: Medicare Other

## 2022-03-18 ENCOUNTER — Other Ambulatory Visit: Payer: Self-pay

## 2022-03-18 ENCOUNTER — Encounter (HOSPITAL_COMMUNITY): Payer: Self-pay

## 2022-03-18 ENCOUNTER — Inpatient Hospital Stay (HOSPITAL_COMMUNITY)
Admission: EM | Admit: 2022-03-18 | Discharge: 2022-03-22 | DRG: 562 | Disposition: A | Discharge status: Skilled Nursing Facility | Payer: Medicare Other | Attending: Family Medicine | Admitting: Family Medicine

## 2022-03-18 ENCOUNTER — Emergency Department (HOSPITAL_COMMUNITY): Payer: Medicare Other

## 2022-03-18 DIAGNOSIS — M7989 Other specified soft tissue disorders: Secondary | ICD-10-CM

## 2022-03-18 DIAGNOSIS — L03115 Cellulitis of right lower limb: Secondary | ICD-10-CM | POA: Diagnosis present

## 2022-03-18 DIAGNOSIS — I5032 Chronic diastolic (congestive) heart failure: Secondary | ICD-10-CM | POA: Diagnosis present

## 2022-03-18 DIAGNOSIS — Z888 Allergy status to other drugs, medicaments and biological substances status: Secondary | ICD-10-CM

## 2022-03-18 DIAGNOSIS — I71019 Dissection of thoracic aorta, unspecified: Secondary | ICD-10-CM | POA: Diagnosis present

## 2022-03-18 DIAGNOSIS — R52 Pain, unspecified: Secondary | ICD-10-CM | POA: Diagnosis not present

## 2022-03-18 DIAGNOSIS — Z7984 Long term (current) use of oral hypoglycemic drugs: Secondary | ICD-10-CM

## 2022-03-18 DIAGNOSIS — S82841A Displaced bimalleolar fracture of right lower leg, initial encounter for closed fracture: Principal | ICD-10-CM

## 2022-03-18 DIAGNOSIS — J9621 Acute and chronic respiratory failure with hypoxia: Secondary | ICD-10-CM | POA: Diagnosis present

## 2022-03-18 DIAGNOSIS — I482 Chronic atrial fibrillation, unspecified: Secondary | ICD-10-CM | POA: Diagnosis present

## 2022-03-18 DIAGNOSIS — M810 Age-related osteoporosis without current pathological fracture: Secondary | ICD-10-CM | POA: Diagnosis present

## 2022-03-18 DIAGNOSIS — Z794 Long term (current) use of insulin: Secondary | ICD-10-CM

## 2022-03-18 DIAGNOSIS — F5104 Psychophysiologic insomnia: Secondary | ICD-10-CM | POA: Diagnosis present

## 2022-03-18 DIAGNOSIS — Z66 Do not resuscitate: Secondary | ICD-10-CM | POA: Diagnosis present

## 2022-03-18 DIAGNOSIS — Z96611 Presence of right artificial shoulder joint: Secondary | ICD-10-CM | POA: Diagnosis present

## 2022-03-18 DIAGNOSIS — Z91040 Latex allergy status: Secondary | ICD-10-CM | POA: Diagnosis not present

## 2022-03-18 DIAGNOSIS — I358 Other nonrheumatic aortic valve disorders: Secondary | ICD-10-CM | POA: Diagnosis present

## 2022-03-18 DIAGNOSIS — Z9981 Dependence on supplemental oxygen: Secondary | ICD-10-CM

## 2022-03-18 DIAGNOSIS — I4891 Unspecified atrial fibrillation: Secondary | ICD-10-CM | POA: Diagnosis not present

## 2022-03-18 DIAGNOSIS — S82891A Other fracture of right lower leg, initial encounter for closed fracture: Secondary | ICD-10-CM | POA: Diagnosis not present

## 2022-03-18 DIAGNOSIS — Z7901 Long term (current) use of anticoagulants: Secondary | ICD-10-CM | POA: Diagnosis not present

## 2022-03-18 DIAGNOSIS — J9611 Chronic respiratory failure with hypoxia: Secondary | ICD-10-CM | POA: Diagnosis present

## 2022-03-18 DIAGNOSIS — Z885 Allergy status to narcotic agent status: Secondary | ICD-10-CM | POA: Diagnosis not present

## 2022-03-18 DIAGNOSIS — I10 Essential (primary) hypertension: Secondary | ICD-10-CM | POA: Diagnosis present

## 2022-03-18 DIAGNOSIS — Z79899 Other long term (current) drug therapy: Secondary | ICD-10-CM

## 2022-03-18 DIAGNOSIS — Z853 Personal history of malignant neoplasm of breast: Secondary | ICD-10-CM | POA: Diagnosis not present

## 2022-03-18 DIAGNOSIS — E785 Hyperlipidemia, unspecified: Secondary | ICD-10-CM | POA: Diagnosis present

## 2022-03-18 DIAGNOSIS — Z8249 Family history of ischemic heart disease and other diseases of the circulatory system: Secondary | ICD-10-CM

## 2022-03-18 DIAGNOSIS — E1165 Type 2 diabetes mellitus with hyperglycemia: Secondary | ICD-10-CM | POA: Diagnosis present

## 2022-03-18 DIAGNOSIS — Z8781 Personal history of (healed) traumatic fracture: Secondary | ICD-10-CM

## 2022-03-18 DIAGNOSIS — L03119 Cellulitis of unspecified part of limb: Secondary | ICD-10-CM

## 2022-03-18 DIAGNOSIS — Z9012 Acquired absence of left breast and nipple: Secondary | ICD-10-CM

## 2022-03-18 DIAGNOSIS — Z833 Family history of diabetes mellitus: Secondary | ICD-10-CM

## 2022-03-18 DIAGNOSIS — Z90710 Acquired absence of both cervix and uterus: Secondary | ICD-10-CM

## 2022-03-18 DIAGNOSIS — I110 Hypertensive heart disease with heart failure: Secondary | ICD-10-CM | POA: Diagnosis present

## 2022-03-18 LAB — CBC
HCT: 32.5 % — ABNORMAL LOW (ref 36.0–46.0)
Hemoglobin: 10.3 g/dL — ABNORMAL LOW (ref 12.0–15.0)
MCH: 30.3 pg (ref 26.0–34.0)
MCHC: 31.7 g/dL (ref 30.0–36.0)
MCV: 95.6 fL (ref 80.0–100.0)
Platelets: 186 10*3/uL (ref 150–400)
RBC: 3.4 MIL/uL — ABNORMAL LOW (ref 3.87–5.11)
RDW: 16.2 % — ABNORMAL HIGH (ref 11.5–15.5)
WBC: 7 10*3/uL (ref 4.0–10.5)
nRBC: 0 % (ref 0.0–0.2)

## 2022-03-18 LAB — BASIC METABOLIC PANEL
Anion gap: 10 (ref 5–15)
BUN: 23 mg/dL (ref 8–23)
CO2: 24 mmol/L (ref 22–32)
Calcium: 8.5 mg/dL — ABNORMAL LOW (ref 8.9–10.3)
Chloride: 103 mmol/L (ref 98–111)
Creatinine, Ser: 0.89 mg/dL (ref 0.44–1.00)
GFR, Estimated: 59 mL/min — ABNORMAL LOW (ref >60)
Glucose, Bld: 255 mg/dL — ABNORMAL HIGH (ref 70–99)
Potassium: 4.3 mmol/L (ref 3.5–5.1)
Sodium: 137 mmol/L (ref 135–145)

## 2022-03-18 LAB — CBG MONITORING, ED: Glucose-Capillary: 247 mg/dL — ABNORMAL HIGH (ref 70–99)

## 2022-03-18 LAB — MAGNESIUM: Magnesium: 1.7 mg/dL (ref 1.7–2.4)

## 2022-03-18 MED ORDER — FUROSEMIDE 40 MG PO TABS
80.0000 mg | ORAL_TABLET | Freq: Two times a day (BID) | ORAL | Status: DC
  Administered 2022-03-18 – 2022-03-22 (×8): 80 mg via ORAL
  Filled 2022-03-18 (×3): qty 2
  Filled 2022-03-18: qty 4
  Filled 2022-03-18 (×4): qty 2

## 2022-03-18 MED ORDER — TAMSULOSIN HCL 0.4 MG PO CAPS
0.4000 mg | ORAL_CAPSULE | Freq: Every day | ORAL | Status: DC
  Administered 2022-03-18 – 2022-03-22 (×5): 0.4 mg via ORAL
  Filled 2022-03-18 (×5): qty 1

## 2022-03-18 MED ORDER — FERROUS GLUCONATE 324 (38 FE) MG PO TABS
324.0000 mg | ORAL_TABLET | Freq: Two times a day (BID) | ORAL | Status: DC
  Administered 2022-03-18 – 2022-03-22 (×9): 324 mg via ORAL
  Filled 2022-03-18 (×10): qty 1

## 2022-03-18 MED ORDER — DILTIAZEM HCL 30 MG PO TABS
30.0000 mg | ORAL_TABLET | Freq: Once | ORAL | Status: AC
  Administered 2022-03-18: 30 mg via ORAL
  Filled 2022-03-18: qty 1

## 2022-03-18 MED ORDER — OXYCODONE HCL 5 MG PO TABS
5.0000 mg | ORAL_TABLET | ORAL | Status: DC | PRN
  Administered 2022-03-19 (×2): 5 mg via ORAL
  Filled 2022-03-18 (×2): qty 1

## 2022-03-18 MED ORDER — INSULIN ASPART 100 UNIT/ML IJ SOLN
0.0000 [IU] | Freq: Three times a day (TID) | INTRAMUSCULAR | Status: DC
  Administered 2022-03-18: 3 [IU] via SUBCUTANEOUS
  Administered 2022-03-19: 2 [IU] via SUBCUTANEOUS
  Administered 2022-03-19: 1 [IU] via SUBCUTANEOUS
  Administered 2022-03-20: 6 [IU] via SUBCUTANEOUS
  Administered 2022-03-20 – 2022-03-21 (×2): 2 [IU] via SUBCUTANEOUS
  Administered 2022-03-21: 7 [IU] via SUBCUTANEOUS
  Administered 2022-03-21 – 2022-03-22 (×2): 3 [IU] via SUBCUTANEOUS
  Administered 2022-03-22: 1 [IU] via SUBCUTANEOUS
  Administered 2022-03-22: 2 [IU] via SUBCUTANEOUS

## 2022-03-18 MED ORDER — POTASSIUM CHLORIDE CRYS ER 20 MEQ PO TBCR
20.0000 meq | EXTENDED_RELEASE_TABLET | Freq: Every day | ORAL | Status: DC
  Administered 2022-03-18 – 2022-03-22 (×5): 20 meq via ORAL
  Filled 2022-03-18 (×5): qty 1

## 2022-03-18 MED ORDER — INSULIN GLARGINE-YFGN 100 UNIT/ML ~~LOC~~ SOLN
8.0000 [IU] | Freq: Every day | SUBCUTANEOUS | Status: DC
  Administered 2022-03-18 – 2022-03-20 (×3): 8 [IU] via SUBCUTANEOUS
  Filled 2022-03-18 (×4): qty 0.08

## 2022-03-18 MED ORDER — SENNA 8.6 MG PO TABS
1.0000 | ORAL_TABLET | Freq: Two times a day (BID) | ORAL | Status: DC
  Administered 2022-03-18 – 2022-03-22 (×7): 8.6 mg via ORAL
  Filled 2022-03-18 (×7): qty 1

## 2022-03-18 MED ORDER — SODIUM CHLORIDE 0.9% FLUSH
3.0000 mL | Freq: Two times a day (BID) | INTRAVENOUS | Status: DC
  Administered 2022-03-19 – 2022-03-22 (×7): 3 mL via INTRAVENOUS

## 2022-03-18 MED ORDER — DILTIAZEM LOAD VIA INFUSION
15.0000 mg | Freq: Once | INTRAVENOUS | Status: AC
  Administered 2022-03-18: 15 mg via INTRAVENOUS
  Filled 2022-03-18: qty 15

## 2022-03-18 MED ORDER — RIVAROXABAN 15 MG PO TABS
15.0000 mg | ORAL_TABLET | Freq: Every day | ORAL | Status: DC
  Administered 2022-03-18 – 2022-03-22 (×5): 15 mg via ORAL
  Filled 2022-03-18 (×5): qty 1

## 2022-03-18 MED ORDER — ACETAMINOPHEN 325 MG PO TABS
650.0000 mg | ORAL_TABLET | Freq: Four times a day (QID) | ORAL | Status: DC | PRN
  Administered 2022-03-18 – 2022-03-21 (×7): 650 mg via ORAL
  Filled 2022-03-18 (×7): qty 2

## 2022-03-18 MED ORDER — SODIUM CHLORIDE 0.9 % IV SOLN: 250.0000 mL | INTRAVENOUS | Status: DC | PRN

## 2022-03-18 MED ORDER — ACETAMINOPHEN 650 MG RE SUPP: 650.0000 mg | Freq: Four times a day (QID) | RECTAL | Status: DC | PRN

## 2022-03-18 MED ORDER — DILTIAZEM HCL-DEXTROSE 125-5 MG/125ML-% IV SOLN (PREMIX)
5.0000 mg/h | INTRAVENOUS | Status: DC
  Administered 2022-03-18: 5 mg/h via INTRAVENOUS
  Administered 2022-03-19: 15 mg/h via INTRAVENOUS
  Filled 2022-03-18 (×3): qty 125

## 2022-03-18 MED ORDER — SODIUM CHLORIDE 0.9% FLUSH: 3.0000 mL | INTRAVENOUS | Status: DC | PRN

## 2022-03-18 NOTE — Assessment & Plan Note (Addendum)
CHA2DS2-Vasc score of 5 Check TSH/mag  Recent echo 08/2021  Continue cardizem gtt to control rate She tells me this happens every time she gets sick or as a big event her heart goes "fast" Hopefully can start back her home medication tomorrow (on metoprolol and cardizem CD)  Continue xarelto

## 2022-03-18 NOTE — Assessment & Plan Note (Addendum)
Has been on 2 rounds of RLE cellulitis. Completed both courses.  She just had leg splinted and wrapped for fracture so can not examine. She tells me it has improved.  ? Venous stasis changes from swelling vs. True cellulitis She has no leukocytosis or other systemic signs Will need exam when can take off splint  -right LE doppler, negative for DVT

## 2022-03-18 NOTE — Consult Note (Addendum)
Orthopedic Surgery Consult Note  Assessment: Patient is a 88 y.o. female with right ankle bimalleolar fracture   Plan: -I talked with the patient about her injury. Explained that normally bimalleolar ankle fracture undergo operative stabilization to prevent post-traumatic arthritis and give the ankle joint stability. I discussed that I would monitor her ankle in the splint to make sure that the talus stays under the tibia. We talked about the benefit of fixing her ankle to try to prevent arthritis. We also talked about her age and her activity level and she decided that preventing arthritis was not a major benefit in her mind. After our discussion, she decided to pursue non-operative treatment. I told her that I would monitor her with serial XRs to make sure that the talus stays under the tibia. I told her if that shifts, the plan would change and I would recommend operative intervention. She expressed understanding of the plan. All her questions were answered to her satisfaction.   NWB RLE in splint PT/OT eval and treat Okay for diet and dvt ppx from ortho perspective Will continue to monitor Next XR would be in 1 week (11/02)  ___________________________________________________________________________   Chief complaint: right ankle pain Reason for consult: right ankle fracture  History:  Patient is a 88 y.o. female who injured her ankle yesterday after her foot got stuck Pain location: right ankle, no pain elsewhere Severity of pain: moderate Mechanism of injury: got her foot stuck Duration of symptoms: 1 day Paresthesias and numbness: denies  Review of systems: General: denies fevers and chills, myalgias Neurologic: denies recent changes in vision, slurred speech Abdomen: denies nausea, vomiting, hematemesis Respiratory: denies cough, shortness of breath  Past medical history:  Atrial fibrillation DM CHF Thoracic aortic dissection HLD HTN  Allergies: Ambien, codeine,  dulaglutide, latex, morphine, mirtazapine   Past surgical history:  Left intertrochanteric femur fracture IMN Femur fracture ORIF Cardioversion Mastectomy ORIF ulna fracture Hardware removal R reverse shoulder arthroplasty Shoulder surgery Tonsillectomy Hysterectomy Salpingoophorectomy   Wrist surgery  Social history: Denies use of nicotine-containing products (cigarettes, vaping, smokeless, etc.) Alcohol use: denies Denies use of recreational drugs   Physical Exam:  General: no acute distress, appears stated age, eating in bed Neurologic: alert, answering questions appropriately, following commands Cardiovascular: regular rate, no cyanosis Respiratory: unlabored breathing on room air, symmetric chest rise Psychiatric: appropriate affect, normal cadence to speech  MSK:   -Bilateral upper extremities  No tenderness to palpation over extremity, no gross deformity Fires deltoid, biceps, triceps, wrist extensors, wrist flexors, finger extensors, finger flexors  AIN/PIN/IO intact  Palpable radial pulse  Sensation intact to light touch in median/ulnar/radial/axillary nerve distributions  Hand warm and well perfused  -Bilateral lower extremities  No tenderness to palpation over extremity, except around left ankle. No other tenderness to palpation. Negative log roll.  Fires hip flexors, quadriceps, hamstrings, tibialis anterior, gastrocnemius and soleus, extensor hallucis longus Plantarflexes and dorsiflexes toes Sensation intact to light touch in sural, saphenous, tibial, deep peroneal, and superficial peroneal nerve distributions Foot warm and well perfused  Imaging: XR of the right ankle from 03/18/2022 was independently reviewed and interpreted, showing mildly displaced fibula and medial malleolus fractures. No other fractures seen. Talus reduced under tibia.    Patient name: Tara Bradley Patient MRN: [id number] Date: 03/18/22

## 2022-03-18 NOTE — H&P (Signed)
History and Physical    Patient: Tara Bradley DOB: 01-03-1923 DOA: 03/18/2022 DOS: the patient was seen and examined on 03/18/2022 PCP: [name], MD  Patient coming from:  home  - lives alone. She uses a walker at home.    Chief Complaint: fall   HPI: Tara Bradley is a 88 y.o. female with medical history significant of chronic atrial fibrillation on xarelto, T2DM, HTN, HLD, diastolic CHF, insomnia, hx of breast cancer, chronic respiratory failure on 4L oxygen PRN, aortic aneurysm with chronic descending dissection who presented to ED after a fall. She was using an electric wheelchair and was trying to go between the door and hit the side of the door frame. Her foot was there and got stuck and the WC kept going and twisted her ankle. She didn't fall out of the chair. Her foot was just stuck until she was able to reverse the chair. She had 10/10 pain in her right ankle. She is on xarelto and last took in on 10/24 in the pm.   She was just getting over cellulitis of her lower legs. She was on oral antibiotics for this.   She has been feeling good. Denies any fever/chills, vision changes/headaches, chest pain or palpitations, shortness of breath or cough, abdominal pain, N/V/D, dysuria or leg swelling.    She does not smoke or drink alcohol.   ER Course:  afebrile, bp: 115/85, HR: 139, RR: 23 oxygen 98% Minor Pertinent labs: hgb: 10.3,  CXR: ill defined opacity within left lung base. Bilateral pleural effusion. Trace right/small left. Cardiomegaly.  Tib/fib: minimally displaced oblique fx of the distal fibular diaphysis and non displaced intra-articular fracture of the medial malleolus.  In ED: ortho consulted. Given 59m of cardizem and started on cardizem gtt. TR[facility] asked to admit.   Review of Systems: As mentioned in the history of present illness. All other systems reviewed and are negative. Past Medical History:  Diagnosis Date   Aortic valve sclerosis    Echo,  2008   Arthritis    Bradycardia    October, 2012   Cancer ([facility]    Chronic atrial fibrillation (HCC)    [place]    Chronic insomnia    Closed fracture of unspecified part of femur 2005   Diabetes mellitus, type 2 ([place]    Diverticulitis    Diverticulosis    Hyperlipidemia    Hypertension    Long term (current) use of anticoagulants    Osteopenia    Osteoporosis    femur fracture 2005, pelvic fracture 2006   Personal history of malignant neoplasm of breast    Pneumonia    Syncope    [place]oracic [place] 02/2017   chronic descending aorta dissection   Unspecified closed fracture of pelvis 2006   Past Surgical History:  Procedure Laterality Date   CARDIOVERSION N/A 08/09/2012   Procedure: CARDIOVERSION;  Surgeon: SDeboraha Sprang MD;  [facility]  Service: Cardiovascular;  Laterality: N/A;   EYE SURGERY     FEMUR SURGERY  2005   ORIF   INTRAMEDULLARY (IM) NAIL INTERTROCHANTERIC Left 08/02/2021   Procedure: INTRAMEDULLARY (IM) NAIL INTERTROCHANTRIC;  Surgeon: MWillaim Sheng MD;  [facility]  Service: Orthopedics;  Laterality: Left;   IR ANGIOGRAM PELVIS SELECTIVE OR SUPRASELECTIVE  03/11/2017   IR ANGIOGRAM SELECTIVE EACH ADDITIONAL VESSEL  03/11/2017   IR EMBO ART  VEN HEMORR LYMPH EXTRAV  INC GUIDE ROADMAPPING  03/11/2017  IR FLUORO GUIDE CV LINE RIGHT  03/11/2017   IR US GUIDE VASC ACCESS RIGHT  03/11/2017   IR US GUIDE VASC ACCESS RIGHT  03/11/2017   MASTECTOMY  1995   left   MINOR HARDWARE REMOVAL Left 09/01/2020   Procedure: REMOVAL OF HARDWARE LEFT FOREARM;  Surgeon: Milly Jakob, MD;  Location: [place];  Service: Orthopedics;  Laterality: Left;   ORIF ULNAR FRACTURE Left 10/19/2019   Procedure: OPEN REDUCTION INTERNAL FIXATION (ORIF)  LEFT DISTAL RADIUS  AND ULNA FRACTURE;  Surgeon: Milly Jakob, MD;  Location: [place];  Service: Orthopedics;  Laterality: Left;    REVERSE SHOULDER ARTHROPLASTY Right 05/04/2021   Procedure: REVERSE SHOULDER ARTHROPLASTY;  Surgeon: Netta Cedars, MD;  Location: [place];  Service: Orthopedics;  Laterality: Right;   SHOULDER SURGERY     left   SHOULDER SURGERY  1979, 2003   TONSILLECTOMY     TOTAL ABDOMINAL HYSTERECTOMY W/ BILATERAL SALPINGOOPHORECTOMY  1995   WRIST SURGERY      x 2    Social History:  reports that she has never smoked. She has never used smokeless tobacco. She reports that she does not drink alcohol and does not use drugs.  Allergies  Allergen Reactions   Ambien [Zolpidem] Other (See Comments)    hallucinations   Codeine Other (See Comments)    headache    Codeine Sulfate Other (See Comments)    REACTION: unspecified   Dulaglutide Diarrhea   Latex Hives   Morphine Sulfate Nausea And Vomiting   Mirtazapine Palpitations    Family History  Problem Relation Age of Onset   Congestive Heart Failure Mother 25   Heart attack Father 34   Diabetes type II Father    Diabetes type II Brother        1/2   Multiple myeloma Brother        2/2   Diabetes type II Son    Colon cancer Neg Hx     Prior to Admission medications   Medication Sig Start Date End Date Taking? Authorizing Provider  acetaZOLAMIDE (DIAMOX) 250 MG tablet TAKE ONE TABLET BY MOUTH DAILY 10/29/21   [name], MD  BD PEN NEEDLE NANO U/F 32G X 4 MM MISC Inject into the skin daily. 11/25/20   [provider]  Calcium Carbonate-Vitamin D 600-400 MG-UNIT tablet Take 1 tablet by mouth daily.    [provider]  Cholecalciferol (VITAMIN D) 125 MCG (5000 UT) CAPS Take 5,000 Units by mouth daily.    [provider]  diltiazem (CARDIZEM CD) 360 MG 24 hr capsule Take 1 capsule (360 mg total) by mouth daily. 05/12/21   Shelly Coss, MD  eszopiclone (LUNESTA) 2 MG TABS tablet Take 2 mg by mouth at bedtime. Take immediately before bedtime    [provider]  ferrous gluconate (FERGON) 324 MG tablet Take  324 mg by mouth 2 (two) times daily with a meal.    [provider]  furosemide (LASIX) 40 MG tablet Take 2 tablets (80 mg total) by mouth 2 (two) times daily for 4 days, THEN 1 tablet (40 mg total) 2 (two) times daily. Take extra 40 mg tablet for weight gain of 3 lbs in 1 day or 5 lbs in 1 week. 09/15/21 02/15/22  Lavina Hamman, MD  furosemide (LASIX) 80 MG tablet TAKE ONE TABLET TWICE DAILY AT 8am AND 9pm FOR edema/chf 12/22/21   [name], MD  gabapentin (NEURONTIN) 100 MG capsule Take 100 mg  by mouth 3 (three) times daily. 08/14/21   [provider]  insulin glargine (LANTUS) 100 UNIT/ML Solostar Pen Inject 8 Units into the skin daily.    [provider]  insulin lispro (HUMALOG) 100 UNIT/ML injection Inject 0-12 Units into the skin daily. Sliding scale : If blood sugar less than 70, call NP 70-200 = 0 units 201-250 = 2 units 251-300 = 4 units 301-350 = 6 units 351-400 = 8 units 401-450 = 10 units 451-600 = 12 units If greater than 450 give 12 units. Recheck in 2 hours. If >350 or <100 notify provider.    [provider]  ipratropium-albuterol (DUONEB) 0.5-2.5 (3) MG/3ML SOLN Take 3 mLs by nebulization 2 (two) times daily.    [provider]  magnesium oxide (MAG-OX) 400 MG tablet Take 400 mg by mouth at bedtime.    [provider]  metFORMIN (GLUMETZA) 500 MG (MOD) 24 hr tablet Take 1,000 mg by mouth every evening.    [provider]  metoprolol succinate (TOPROL-XL) 100 MG 24 hr tablet TAKE ONE TABLET BY MOUTH EVERY MORNING 03/10/22   [name], MD  metoprolol tartrate (LOPRESSOR) 25 MG tablet Take 25 mg by mouth as needed.    [provider]  polyethylene glycol (MIRALAX / GLYCOLAX) 17 g packet Take 17 g by mouth daily. 05/12/21   Shelly Coss, MD  potassium chloride (KLOR-CON) 10 MEQ tablet Take 2 tablets (20 mEq total) by mouth daily. 01/20/22   [name], MD  potassium chloride SA (KLOR-CON M) 20  MEQ tablet TAKE TWO TABLETS EVERY EVENING AT FIVE 12/22/21   [name], MD  senna (SENOKOT) 8.6 MG TABS tablet Take 1 tablet (8.6 mg total) by mouth 2 (two) times daily. 08/07/21   Shelly Coss, MD  tamsulosin (FLOMAX) 0.4 MG CAPS capsule TAKE ONE CAPSULE BY MOUTH IN THE MORNING 01/29/22   [name], MD  traZODone (DESYREL) 50 MG tablet Take 50 mg by mouth at bedtime as needed for sleep.    [provider]  white petrolatum (VASELINE) GEL Apply 1 application. topically in the morning, at noon, in the evening, and at bedtime.    [provider]  XARELTO 15 MG TABS tablet TAKE 1 TABLET ONCE DAILY WITH SUPPER. Patient taking differently: Take 15 mg by mouth daily with supper. 06/22/21   [name], MD    Physical Exam: Vitals:   03/18/22 1915 03/18/22 1925 03/18/22 1930 03/18/22 1935  BP: 121/77  107/69   Pulse: (!) 158 98 72 (!) 112  Resp: (!) 31 (!) 21 (!) 29 15  Temp:      TempSrc:      SpO2: 99% 100% 95% 97%   General:  Appears calm and comfortable and is in NAD Eyes:  PERRL, EOMI, normal lids, iris ENT:  HOH, lips & tongue, mmm; appropriate dentition Neck:  no LAD, masses or thyromegaly; no carotid bruits Cardiovascular:  irregularly, irregular, no m/r/g. Right leg can not be assessed, left leg with trace LE edema.  Respiratory:   CTA bilaterally with no wheezes/rales/rhonchi.  Normal respiratory effort. Abdomen:  soft, NT, ND, NABS Back:   normal alignment, no CVAT Skin:  no rash or induration seen on limited exam Musculoskeletal:  grossly normal tone BUE/BLE, good ROM, right leg in splint/wrapped Lower extremity: Limited foot exam with no ulcerations.  2+ distal pulses. Psychiatric:  grossly normal mood and affect, speech fluent and appropriate, AOx3 Neurologic:  CN 2-12 grossly intact, moves all extremities  in coordinated fashion, sensation intact   Radiological Exams on Admission: Independently reviewed - see discussion in A/P where  applicable  VAS [place] LOWER EXTREMITY VENOUS (DVT) (ONLY [facility] & [facility])  Result Date: 03/18/2022  Lower Venous DVT Study Patient Name:  Tara Bradley  Date of Exam:   03/18/2022 Medical Rec #: [id number]         Accession #:    [id number] Date of Birth: [birth year]          Patient Gender: F Patient Age:   79 years Exam Location:  [facility] Procedure:      VAS [place] LOWER EXTREMITY VENOUS (DVT) Referring Phys: Thelma Comp NANAVATI --------------------------------------------------------------------------------  Indications: Pain, Swelling, and Erythema. Patient got foot caught in mobile scooter yesterday.  Limitations: Edema and pain with touch. Comparison Study: No prior study on file Performing Technologist: Sharion Dove RVS  Examination Guidelines: A complete evaluation includes B-mode imaging, spectral Doppler, color Doppler, and power Doppler as needed of all accessible portions of each vessel. Bilateral testing is considered an integral part of a complete examination. Limited examinations for reoccurring indications may be performed as noted. The reflux portion of the exam is performed with the patient in reverse Trendelenburg.  +---------+---------------+---------+-----------+----------+-------------------+ RIGHT    CompressibilityPhasicitySpontaneityPropertiesThrombus Aging      +---------+---------------+---------+-----------+----------+-------------------+ CFV      Full           Yes      Yes                                      +---------+---------------+---------+-----------+----------+-------------------+ SFJ      Full                                                             +---------+---------------+---------+-----------+----------+-------------------+ FV Prox  Full                                                             +---------+---------------+---------+-----------+----------+-------------------+ FV Mid   Full                                                              +---------+---------------+---------+-----------+----------+-------------------+ FV DistalFull                                                             +---------+---------------+---------+-----------+----------+-------------------+ PFV      Full                                                             +---------+---------------+---------+-----------+----------+-------------------+  POP      Full           Yes      Yes                                      +---------+---------------+---------+-----------+----------+-------------------+ PTV      Full                                         patent by color and                                                       Doppler             +---------+---------------+---------+-----------+----------+-------------------+ PERO     Full                                                             +---------+---------------+---------+-----------+----------+-------------------+   +----+---------------+---------+-----------+----------+--------------+ LEFTCompressibilityPhasicitySpontaneityPropertiesThrombus Aging +----+---------------+---------+-----------+----------+--------------+ CFV Full           Yes      Yes                                 +----+---------------+---------+-----------+----------+--------------+    Summary: RIGHT: - There is no evidence of deep vein thrombosis in the lower extremity.  - No cystic structure found in the popliteal fossa. - Ultrasound characteristics of enlarged lymph nodes are noted in the groin.  LEFT: - No evidence of common femoral vein obstruction.  *See table(s) above for measurements and observations. Electronically signed by Orlie Pollen on 03/18/2022 at 2:39:54 PM.    Final    DG Tibia/Fibula Right  Result Date: 03/18/2022 CLINICAL DATA:  Foot pain with erythema and swelling after injury yesterday. EXAM: RIGHT TIBIA AND FIBULA - 2 VIEW; RIGHT ANKLE - COMPLETE 3+  VIEW COMPARISON:  None Available. FINDINGS: The bones are diffusely demineralized. There is a minimally displaced oblique fracture of the distal fibular diaphysis. There is a nondisplaced intra-articular fracture involving the base of the medial malleolus. No significant widening of the ankle mortise, dislocation or tarsal bone fracture identified. No acute injuries are identified within the proximal lower leg. There are advanced degenerative changes at the knee with prominent medial compartment osteophytes. Right femoral intramedullary nail is partially imaged. There are edematous changes throughout the soft tissues without evidence of foreign body or soft tissue emphysema. Scattered vascular calcifications are noted. IMPRESSION: Minimally displaced oblique fracture of the distal fibular diaphysis and nondisplaced intra-articular fracture of the medial malleolus. Electronically Signed   By: Richardean Sale M.D.   On: 03/18/2022 09:11   DG Ankle Complete Right  Result Date: 03/18/2022 CLINICAL DATA:  Foot pain with erythema and swelling after injury yesterday. EXAM: RIGHT TIBIA AND FIBULA - 2 VIEW; RIGHT ANKLE - COMPLETE 3+ VIEW COMPARISON:  None Available. FINDINGS: The bones are diffusely demineralized. There is a minimally displaced oblique fracture  of the distal fibular diaphysis. There is a nondisplaced intra-articular fracture involving the base of the medial malleolus. No significant widening of the ankle mortise, dislocation or tarsal bone fracture identified. No acute injuries are identified within the proximal lower leg. There are advanced degenerative changes at the knee with prominent medial compartment osteophytes. Right femoral intramedullary nail is partially imaged. There are edematous changes throughout the soft tissues without evidence of foreign body or soft tissue emphysema. Scattered vascular calcifications are noted. IMPRESSION: Minimally displaced oblique fracture of the distal fibular  diaphysis and nondisplaced intra-articular fracture of the medial malleolus. Electronically Signed   By: Richardean Sale M.D.   On: 03/18/2022 09:11   DG Chest Port 1 View  Result Date: 03/18/2022 CLINICAL DATA:  Provided history: Shortness of breath, blunt trauma to medial ankle and distal leg. EXAM: PORTABLE CHEST 1 VIEW COMPARISON:  Prior chest radiographs 01/15/2022 and earlier. Chest CT 02/10/2022. FINDINGS: Cardiomegaly. Aortic atherosclerosis. Bilateral pleural effusions (trace right, small left). Ill-defined opacity within the left lung base. No appreciable airspace consolidation on the right. No evidence of pneumothorax. No acute bony abnormality identified. Chronic bilateral rib fracture deformities. Prominent dextrocurvature of the thoracic spine. Prior right shoulder arthroplasty. Soft tissue anchor within the proximal left humerus. IMPRESSION: 1. Ill-defined opacity within the left lung base. While this may reflect atelectasis, pneumonia is difficult to exclude. 2. Bilateral pleural effusions (trace right, small left). 3. Cardiomegaly. 4. Aortic Atherosclerosis (ICD10-I70.0). 5. Prominent dextrocurvature of the thoracic spine. Electronically Signed   By: Kellie Simmering D.O.   On: 03/18/2022 09:09    EKG: Independently reviewed.  Atrial fibrillation with rate 141; nonspecific ST changes with no evidence of acute ischemia   Labs on Admission: I have personally reviewed the available labs and imaging studies at the time of the admission.  Pertinent labs:   Hgb: 10.3   Assessment and Plan: Principal Problem:   Bimalleolar fracture of right ankle Active Problems:   Chronic atrial fibrillation with RVR (HCC)   Uncontrolled type 2 diabetes mellitus with hyperglycemia, with long-term current use of insulin (HCC)   Chronic diastolic CHF (congestive heart failure) (HCC)   Essential hypertension   Chronic hypoxemic respiratory failure (HCC)   Hyperlipidemia   Recurrent cellulitis of lower  extremity    Assessment and Plan: * Bimalleolar fracture of right ankle 88 year old presenting to ED after a fall/crash with her motorized scooter where her ankle got stuck between the door and her scooter found to have a bimalleolar right fracture.  -admit to progressive for afib with RVR -ortho consulted and planning on conservative approach -okay for diet and continued DOAC -non weight bearing -pain control with tylenol/oxy -f/u with ortho recommendations   Chronic atrial fibrillation with RVR (HCC) CHA2DS2-Vasc score of 5 Continue cardizem gtt to control rate She tells me this happens every time she gets sick or as a big event her heart goes "fast" Hopefully can start back her home medication tomorrow (on metoprolol and cardizem CD)  Continue xarelto    Uncontrolled type 2 diabetes mellitus with hyperglycemia, with long-term current use of insulin (HCC) A1C 6.9 per recent cardiology note Hold metformin Continue lantus SSI and accuchecks qac/hs   Chronic diastolic CHF (congestive heart failure) ([place]) euvolemic Echo 08/2021 with normal EF and indeterminate diastolic function. Right VF mildly reduced. Large pleural effusion in left lateral region.  Strict I/O  Continue lasix and metoprolol when off cardizem gtt   Essential hypertension Well controlled Start back metoprolol/cardizem  CD when cardizem gtt stopped   Chronic hypoxemic respiratory failure (HCC) Stable on home 2-3L prn  Weaning off per pulm note   Recurrent cellulitis of lower extremity Has been on 2 rounds of RLE cellulitis. Completed both courses.  She just had leg splinted and wrapped for fracture so can not examine. She tells me it has improved.  ? Venous stasis changes from swelling vs. True cellulitis She has no leukocytosis or other systemic signs Will need exam when can take off splint  -right LE doppler, negative for DVT     Advance Care Planning:   Code Status: DNR   Consults: ortho: Dr. Laurance Flatten    DVT Prophylaxis: xarelto   Family Communication: none   Severity of Illness: The appropriate patient status for this patient is OBSERVATION. Observation status is judged to be reasonable and necessary in order to provide the required intensity of service to ensure the patient's safety. The patient's presenting symptoms, physical exam findings, and initial radiographic and laboratory data in the context of their medical condition is felt to place them at decreased risk for further clinical deterioration. Furthermore, it is anticipated that the patient will be medically stable for discharge from the hospital within 2 midnights of admission.   Author: Orma Flaming, MD 03/18/2022 8:34 PM  For on call review [email or web address].

## 2022-03-18 NOTE — ED Notes (Signed)
Patient's HR currently between 107-118

## 2022-03-18 NOTE — Assessment & Plan Note (Addendum)
euvolemic Echo 08/2021 with normal EF and indeterminate diastolic function. Right VF mildly reduced. Large pleural effusion in left lateral region.  Strict I/O  Continue lasix and metoprolol when off cardizem gtt

## 2022-03-18 NOTE — Plan of Care (Signed)
Orthopedic Plan of Care Note  Patient is a 88 year old female who sustained a right ankle bimalleolar fracture. She has multiple medical co-morbidities. Her medial malleolus is non-displaced. She would be non-weight bearing on the right side even with surgical fixation. I am recommending non-operative treatment in a well padded short leg splint on the right side. She is okay for dvt ppx and diet from ortho perspective. I will see her this evening to discuss the fracture and tentative plan to treat it.   Callie Fielding Orthopedic Surgeon

## 2022-03-18 NOTE — ED Provider Notes (Signed)
[facility] EMERGENCY DEPARTMENT Provider Note   CSN: [id number] Arrival date & time: 03/18/22  5784     History  Chief Complaint  Patient presents with   Tachycardia    Tara Bradley is a 88 y.o. female.  HPI    88 year old female comes in with chief complaint of foot pain. Patient has history of diabetes, hyperlipidemia, A-fib, thoracic artery aneurysm, thoracic artery dissection history.  Patient is on Xarelto.  Patient states that yesterday she got her right foot stuck between the wall and her electric scooter.  She was unable to bear weight without significant discomfort and noted swelling to her right foot, prompting her to come to the ER.  She is having pain primarily over the distal part of her right leg and ankle medially.  Patient denies any trauma to her head.  She is noted to be in A-fib with RVR.  Patient indicates that she is on as needed oxygen.  She was found to have 88% oxygen saturation.  She denies any orthopnea, paroxysmal nocturnal dyspnea and does not feel short of breath with the oxygen on.  She has missed her morning meds.    Home Medications Prior to Admission medications   Medication Sig Start Date End Date Taking? Authorizing Provider  Calcium Carbonate-Vitamin D 600-400 MG-UNIT tablet Take 1 tablet by mouth daily.   Yes [provider]  Cholecalciferol (VITAMIN D) 125 MCG (5000 UT) CAPS Take 5,000 Units by mouth daily.   Yes [provider]  diltiazem (CARDIZEM CD) 360 MG 24 hr capsule Take 1 capsule (360 mg total) by mouth daily. 05/12/21  Yes Shelly Coss, MD  ferrous gluconate (FERGON) 324 MG tablet Take 324 mg by mouth 2 (two) times daily with a meal.   Yes [provider]  furosemide (LASIX) 80 MG tablet TAKE ONE TABLET TWICE DAILY AT 8am AND 9pm FOR edema/chf Patient taking differently: Take 80 mg by mouth 2 (two) times daily. 12/22/21  Yes [name], MD  insulin glargine (LANTUS) 100  UNIT/ML Solostar Pen Inject 8 Units into the skin daily.   Yes [provider]  metFORMIN (GLUMETZA) 500 MG (MOD) 24 hr tablet Take 1,000 mg by mouth every evening.   Yes [provider]  metoprolol succinate (TOPROL-XL) 100 MG 24 hr tablet TAKE ONE TABLET BY MOUTH EVERY MORNING Patient taking differently: Take 100 mg by mouth daily. 03/10/22  Yes Hochrein, Jeneen Rinks, MD  polyethylene glycol (MIRALAX / GLYCOLAX) 17 g packet Take 17 g by mouth daily. 05/12/21  Yes Shelly Coss, MD  potassium chloride SA (KLOR-CON M) 20 MEQ tablet TAKE TWO TABLETS EVERY EVENING AT FIVE Patient taking differently: Take 20 mEq by mouth daily. 12/22/21  Yes [name], MD  senna (SENOKOT) 8.6 MG TABS tablet Take 1 tablet (8.6 mg total) by mouth 2 (two) times daily. 08/07/21  Yes Shelly Coss, MD  tamsulosin (FLOMAX) 0.4 MG CAPS capsule TAKE ONE CAPSULE BY MOUTH IN THE MORNING Patient taking differently: Take 0.4 mg by mouth daily. 01/29/22  Yes Hochrein, Jeneen Rinks, MD  XARELTO 15 MG TABS tablet TAKE 1 TABLET ONCE DAILY WITH SUPPER. Patient taking differently: Take 15 mg by mouth daily with supper. 06/22/21  Yes [name], MD  acetaZOLAMIDE (DIAMOX) 250 MG tablet TAKE ONE TABLET BY MOUTH DAILY Patient not taking: Reported on 03/18/2022 10/29/21   [name], MD  BD PEN NEEDLE NANO U/F 32G X 4 MM MISC Inject into the skin daily. 11/25/20  [provider]  furosemide (LASIX) 40 MG tablet Take 2 tablets (80 mg total) by mouth 2 (two) times daily for 4 days, THEN 1 tablet (40 mg total) 2 (two) times daily. Take extra 40 mg tablet for weight gain of 3 lbs in 1 day or 5 lbs in 1 week. Patient not taking: Reported on 03/18/2022 09/15/21 02/15/22  Lavina Hamman, MD      Allergies    Ambien [zolpidem], Codeine, Codeine sulfate, Dulaglutide, Latex, Morphine sulfate, and Mirtazapine    Review of Systems   Review of Systems  All other systems reviewed and are negative.   Physical  Exam Updated Vital Signs BP 120/89   Pulse 99   Temp 98.7 F (37.1 C) (Oral)   Resp (!) 23   SpO2 97%  Physical Exam Vitals and nursing note reviewed.  Constitutional:      Appearance: She is well-developed.  HENT:     Head: Atraumatic.  Cardiovascular:     Rate and Rhythm: Normal rate.  Pulmonary:     Effort: Pulmonary effort is normal.  Musculoskeletal:     Cervical back: Normal range of motion and neck supple.  Skin:    General: Skin is warm and dry.  Neurological:     Mental Status: She is alert and oriented to person, place, and time.     ED Results / Procedures / Treatments   Labs (all labs ordered are listed, but only abnormal results are displayed) Labs Reviewed  BASIC METABOLIC PANEL - Abnormal; Notable for the following components:      Result Value   Glucose, Bld 255 (*)    Calcium 8.5 (*)    GFR, Estimated 59 (*)    All other components within normal limits  CBC - Abnormal; Notable for the following components:   RBC 3.40 (*)    Hemoglobin 10.3 (*)    HCT 32.5 (*)    RDW 16.2 (*)    All other components within normal limits  MAGNESIUM  BRAIN NATRIURETIC PEPTIDE  TSH    EKG EKG Interpretation  Date/Time:  Thursday March 18 2022 08:25:01 EDT Ventricular Rate:  141 PR Interval:    QRS Duration: 75 QT Interval:  328 QTC Calculation: 481 R Axis:   -44 Text Interpretation: Atrial fibrillation with rapid V-rate Anterior infarct, old No acute changes besides RVR TWI in lateral leads - not new Confirmed by Varney Biles [phone]) on 03/18/2022 10:05:53 AM  Radiology VAS [place] LOWER EXTREMITY VENOUS (DVT) (ONLY [facility] & [facility])  Result Date: 03/18/2022  Lower Venous DVT Study Patient Name:  JUDITHANN VILLAMAR  Date of Exam:   03/18/2022 Medical Rec #: [id number]         Accession #:    [id number] Date of Birth: 1922-10-24          Patient Gender: F Patient Age:   88 years Exam Location:  [facility] Procedure:      VAS [place] LOWER EXTREMITY VENOUS (DVT)  Referring Phys: Thelma Comp Nayah Lukens --------------------------------------------------------------------------------  Indications: Pain, Swelling, and Erythema. Patient got foot caught in mobile scooter yesterday.  Limitations: Edema and pain with touch. Comparison Study: No prior study on file Performing Technologist: Sharion Dove RVS  Examination Guidelines: A complete evaluation includes B-mode imaging, spectral Doppler, color Doppler, and power Doppler as needed of all accessible portions of each vessel. Bilateral testing is considered an integral part of a complete examination. Limited examinations for reoccurring indications may be performed as noted. The reflux  portion of the exam is performed with the patient in reverse Trendelenburg.  +---------+---------------+---------+-----------+----------+-------------------+ RIGHT    CompressibilityPhasicitySpontaneityPropertiesThrombus Aging      +---------+---------------+---------+-----------+----------+-------------------+ CFV      Full           Yes      Yes                                      +---------+---------------+---------+-----------+----------+-------------------+ SFJ      Full                                                             +---------+---------------+---------+-----------+----------+-------------------+ FV Prox  Full                                                             +---------+---------------+---------+-----------+----------+-------------------+ FV Mid   Full                                                             +---------+---------------+---------+-----------+----------+-------------------+ FV DistalFull                                                             +---------+---------------+---------+-----------+----------+-------------------+ PFV      Full                                                              +---------+---------------+---------+-----------+----------+-------------------+ POP      Full           Yes      Yes                                      +---------+---------------+---------+-----------+----------+-------------------+ PTV      Full                                         patent by color and                                                       Doppler             +---------+---------------+---------+-----------+----------+-------------------+  PERO     Full                                                             +---------+---------------+---------+-----------+----------+-------------------+   +----+---------------+---------+-----------+----------+--------------+ LEFTCompressibilityPhasicitySpontaneityPropertiesThrombus Aging +----+---------------+---------+-----------+----------+--------------+ CFV Full           Yes      Yes                                 +----+---------------+---------+-----------+----------+--------------+    Summary: RIGHT: - There is no evidence of deep vein thrombosis in the lower extremity.  - No cystic structure found in the popliteal fossa. - Ultrasound characteristics of enlarged lymph nodes are noted in the groin.  LEFT: - No evidence of common femoral vein obstruction.  *See table(s) above for measurements and observations. Electronically signed by Orlie Pollen on 03/18/2022 at 2:39:54 PM.    Final    DG Tibia/Fibula Right  Result Date: 03/18/2022 CLINICAL DATA:  Foot pain with erythema and swelling after injury yesterday. EXAM: RIGHT TIBIA AND FIBULA - 2 VIEW; RIGHT ANKLE - COMPLETE 3+ VIEW COMPARISON:  None Available. FINDINGS: The bones are diffusely demineralized. There is a minimally displaced oblique fracture of the distal fibular diaphysis. There is a nondisplaced intra-articular fracture involving the base of the medial malleolus. No significant widening of the ankle mortise, dislocation or tarsal bone  fracture identified. No acute injuries are identified within the proximal lower leg. There are advanced degenerative changes at the knee with prominent medial compartment osteophytes. Right femoral intramedullary nail is partially imaged. There are edematous changes throughout the soft tissues without evidence of foreign body or soft tissue emphysema. Scattered vascular calcifications are noted. IMPRESSION: Minimally displaced oblique fracture of the distal fibular diaphysis and nondisplaced intra-articular fracture of the medial malleolus. Electronically Signed   By: Richardean Sale M.D.   On: 03/18/2022 09:11   DG Ankle Complete Right  Result Date: 03/18/2022 CLINICAL DATA:  Foot pain with erythema and swelling after injury yesterday. EXAM: RIGHT TIBIA AND FIBULA - 2 VIEW; RIGHT ANKLE - COMPLETE 3+ VIEW COMPARISON:  None Available. FINDINGS: The bones are diffusely demineralized. There is a minimally displaced oblique fracture of the distal fibular diaphysis. There is a nondisplaced intra-articular fracture involving the base of the medial malleolus. No significant widening of the ankle mortise, dislocation or tarsal bone fracture identified. No acute injuries are identified within the proximal lower leg. There are advanced degenerative changes at the knee with prominent medial compartment osteophytes. Right femoral intramedullary nail is partially imaged. There are edematous changes throughout the soft tissues without evidence of foreign body or soft tissue emphysema. Scattered vascular calcifications are noted. IMPRESSION: Minimally displaced oblique fracture of the distal fibular diaphysis and nondisplaced intra-articular fracture of the medial malleolus. Electronically Signed   By: Richardean Sale M.D.   On: 03/18/2022 09:11   DG Chest Port 1 View  Result Date: 03/18/2022 CLINICAL DATA:  Provided history: Shortness of breath, blunt trauma to medial ankle and distal leg. EXAM: PORTABLE CHEST 1 VIEW  COMPARISON:  Prior chest radiographs 01/15/2022 and earlier. Chest CT 02/10/2022. FINDINGS: Cardiomegaly. Aortic atherosclerosis. Bilateral pleural effusions (trace right, small left). Ill-defined opacity within the left lung base. No appreciable airspace consolidation  on the right. No evidence of pneumothorax. No acute bony abnormality identified. Chronic bilateral rib fracture deformities. Prominent dextrocurvature of the thoracic spine. Prior right shoulder arthroplasty. Soft tissue anchor within the proximal left humerus. IMPRESSION: 1. Ill-defined opacity within the left lung base. While this may reflect atelectasis, pneumonia is difficult to exclude. 2. Bilateral pleural effusions (trace right, small left). 3. Cardiomegaly. 4. Aortic Atherosclerosis (ICD10-I70.0). 5. Prominent dextrocurvature of the thoracic spine. Electronically Signed   By: Kellie Simmering D.O.   On: 03/18/2022 09:09    Procedures .Critical Care  Performed by: Varney Biles, MD Authorized by: Varney Biles, MD   Critical care provider statement:    Critical care time (minutes):  52   Critical care was necessary to treat or prevent imminent or life-threatening deterioration of the following conditions:  Circulatory failure   Critical care was time spent personally by me on the following activities:  Development of treatment plan with patient or surrogate, discussions with consultants, evaluation of patient's response to treatment, examination of patient, ordering and review of laboratory studies, ordering and review of radiographic studies, ordering and performing treatments and interventions, pulse oximetry, re-evaluation of patient's condition, review of old charts and obtaining history from patient or surrogate     Medications Ordered in ED Medications  diltiazem (CARDIZEM) 1 mg/mL load via infusion 15 mg (15 mg Intravenous Bolus from Bag 03/18/22 1243)    And  diltiazem (CARDIZEM) 125 mg in dextrose 5% 125 mL (1 mg/mL)  infusion (5 mg/hr Intravenous New Bag/Given 03/18/22 1243)  insulin aspart (novoLOG) injection 0-9 Units (has no administration in time range)  furosemide (LASIX) tablet 80 mg (has no administration in time range)  insulin glargine-yfgn (SEMGLEE) injection 8 Units (has no administration in time range)  senna (SENOKOT) tablet 8.6 mg (has no administration in time range)  ferrous gluconate (FERGON) tablet 324 mg (has no administration in time range)  Rivaroxaban (XARELTO) tablet 15 mg (has no administration in time range)  potassium chloride SA (KLOR-CON M) CR tablet 20 mEq (has no administration in time range)  tamsulosin (FLOMAX) capsule 0.4 mg (has no administration in time range)  sodium chloride flush (NS) 0.9 % injection 3 mL (has no administration in time range)  sodium chloride flush (NS) 0.9 % injection 3 mL (has no administration in time range)  0.9 %  sodium chloride infusion (has no administration in time range)  acetaminophen (TYLENOL) tablet 650 mg (has no administration in time range)    Or  acetaminophen (TYLENOL) suppository 650 mg (has no administration in time range)  oxyCODONE (Oxy IR/ROXICODONE) immediate release tablet 5 mg (has no administration in time range)  diltiazem (CARDIZEM) tablet 30 mg (30 mg Oral Given 03/18/22 1010)    ED Course/ Medical Decision Making/ A&P                           Medical Decision Making 88 year old female with history of A-fib comes in with chief complaint of foot pain and injury.  Patient has chronic hypoxic respiratory failure, A-fib and CHF.  Patient is usually ambulatory and independent.  She is using scooter for long distance travel only.  On exam, patient noted to have right leg redness, swelling and tenderness over the medial malleoli.   Differential diagnosis includes ankle fracture, ankle sprain. She also has swelling, recently put on antibiotics for cellulitis.  Differential diagnosis in addition to cellulitis includes  DVT.  X-ray of the foot and  ankle on the right side along with ultrasound ordered.  Patient also noted to be in A-fib with RVR. No underlying infection symptoms. Initial thought is that patient might be in pain and because of that she is in A-fib with RVR.  We will give her oral diltiazem to see if she improves, but there was no response.  Patient started on diltiazem drip.  Heart rate improved on reassessment.  Labs ordered and independently reviewed.  CBC and metabolic profile are overall reassuring and at baseline.  X-ray of the ankle independently interpreted.  Ankle x-ray consistent with by mall fracture.  Case discussed with Dr. Lynnette Caffey, orthopedic surgery.  He recommends that patient be placed in a splint and admitted to the hospital.  Likely nonsurgical given her age and comorbidities.  Medicine consulted for admission for A-fib with RVR.  Problems Addressed: Acute on chronic respiratory failure with hypoxia [facility]): acute illness or injury with systemic symptoms Atrial fibrillation with RVR ([place]): acute illness or injury with systemic symptoms Closed fracture of right ankle, initial encounter: complicated acute illness or injury  Amount and/or Complexity of Data Reviewed Labs: ordered. Radiology: ordered.  Risk Prescription drug management. Decision regarding hospitalization.    Final Clinical Impression(s) / ED Diagnoses Final diagnoses:  Atrial fibrillation with RVR (HCC)  Closed fracture of right ankle, initial encounter  Acute on chronic respiratory failure with hypoxia [facility])    Rx / DC Orders ED Discharge Orders     None         Varney Biles, MD 03/18/22 1554

## 2022-03-18 NOTE — Progress Notes (Signed)
VASCULAR LAB    Right lower extremity venous duplex has been performed.  See CV proc for preliminary results.  Messaged negative results to Dr. Kathrynn Humble via secure chat  Mauro Kaufmann, [facility], RVT 03/18/2022, 11:00 AM

## 2022-03-18 NOTE — ED Triage Notes (Signed)
Patient called for right foot pain because she got it hung up in her electric scooter yesterday and it's red and swollen this morning, upon arrival pt was in Afib with RVR, in 170's, and 88% on room air.

## 2022-03-18 NOTE — Assessment & Plan Note (Signed)
Well controlled Start back metoprolol/cardizem CD when cardizem gtt stopped

## 2022-03-18 NOTE — Progress Notes (Addendum)
Orthopedic Tech Progress Note Patient Details:  Tara Bradley 12/05/1922 [id number]  CAM walker applied to RLE. We encouraged ice, elevation, and gentle compression to help with pain/swelling. Pt is aware of needing to remain NWB on her RLE until told otherwise by orthopedics.  Ortho Devices Type of Ortho Device: CAM walker Ortho Device/Splint Location: RLE Ortho Device/Splint Interventions: Ordered, Application, Adjustment   Post Interventions Patient Tolerated: Well Instructions Provided: Care of device, Adjustment of device  Jaymar Loeber Jeri Modena 03/18/2022, 12:41 PM

## 2022-03-18 NOTE — Assessment & Plan Note (Signed)
A1C 6.9 per recent cardiology note Hold metformin Continue lantus SSI and accuchecks qac/hs

## 2022-03-18 NOTE — Assessment & Plan Note (Addendum)
Stable on home 2-3L prn  Weaning off per pulm note

## 2022-03-18 NOTE — Progress Notes (Signed)
Orthopedic Tech Progress Note Patient Details:  Tara Bradley 1922-08-07 [id number]   After consulting with orthopedics, Dr. Kathrynn Humble asked [place] to switch the pt from the existing CAM walker to a posterior short leg splint with stirrups. Kyra and I removed the ACE bandage and CAM walker that was in place and applied a well-padded short leg splint. Motion and sensation of toes remained intact. Pt and home health care taker at bedside are still understanding of the importance of remaining NWB on her RLE.    Ortho Devices Type of Ortho Device: Stirrup splint, Post (short leg) splint, Cotton web roll Ortho Device/Splint Location: RLE Ortho Device/Splint Interventions: Ordered, Application, Adjustment   Post Interventions Patient Tolerated: Well Instructions Provided: Care of device  Carmino Ocain Jeri Modena 03/18/2022, 2:39 PM

## 2022-03-18 NOTE — Assessment & Plan Note (Signed)
88 year old presenting to ED after a fall/crash with her motorized scooter where her ankle got stuck between the door and her scooter found to have a bimalleolar right fracture.  -admit to progressive for afib with RVR -ortho consulted and planning on conservative approach -okay for diet and continued DOAC -non weight bearing -pain control with tylenol/oxy -f/u with ortho recommendations

## 2022-03-19 ENCOUNTER — Inpatient Hospital Stay (HOSPITAL_COMMUNITY): Payer: Medicare Other

## 2022-03-19 DIAGNOSIS — S82841A Displaced bimalleolar fracture of right lower leg, initial encounter for closed fracture: Secondary | ICD-10-CM | POA: Diagnosis not present

## 2022-03-19 LAB — BASIC METABOLIC PANEL WITH GFR
Anion gap: 13 (ref 5–15)
BUN: 20 mg/dL (ref 8–23)
CO2: 29 mmol/L (ref 22–32)
Calcium: 9 mg/dL (ref 8.9–10.3)
Chloride: 96 mmol/L — ABNORMAL LOW (ref 98–111)
Creatinine, Ser: 0.91 mg/dL (ref 0.44–1.00)
GFR, Estimated: 57 mL/min — ABNORMAL LOW
Glucose, Bld: 135 mg/dL — ABNORMAL HIGH (ref 70–99)
Potassium: 3.8 mmol/L (ref 3.5–5.1)
Sodium: 138 mmol/L (ref 135–145)

## 2022-03-19 LAB — GLUCOSE, CAPILLARY
Glucose-Capillary: 141 mg/dL — ABNORMAL HIGH (ref 70–99)
Glucose-Capillary: 168 mg/dL — ABNORMAL HIGH (ref 70–99)
Glucose-Capillary: 170 mg/dL — ABNORMAL HIGH (ref 70–99)
Glucose-Capillary: 200 mg/dL — ABNORMAL HIGH (ref 70–99)

## 2022-03-19 LAB — CBC
HCT: 32 % — ABNORMAL LOW (ref 36.0–46.0)
Hemoglobin: 10.6 g/dL — ABNORMAL LOW (ref 12.0–15.0)
MCH: 30.9 pg (ref 26.0–34.0)
MCHC: 33.1 g/dL (ref 30.0–36.0)
MCV: 93.3 fL (ref 80.0–100.0)
Platelets: 190 K/uL (ref 150–400)
RBC: 3.43 MIL/uL — ABNORMAL LOW (ref 3.87–5.11)
RDW: 16.4 % — ABNORMAL HIGH (ref 11.5–15.5)
WBC: 6.5 K/uL (ref 4.0–10.5)
nRBC: 0 % (ref 0.0–0.2)

## 2022-03-19 LAB — TSH: TSH: 4.251 u[IU]/mL (ref 0.350–4.500)

## 2022-03-19 LAB — MRSA NEXT GEN BY PCR, NASAL: MRSA by PCR Next Gen: NOT DETECTED

## 2022-03-19 LAB — BRAIN NATRIURETIC PEPTIDE: B Natriuretic Peptide: 843.9 pg/mL — ABNORMAL HIGH (ref 0.0–100.0)

## 2022-03-19 MED ORDER — KETOROLAC TROMETHAMINE 15 MG/ML IJ SOLN
15.0000 mg | Freq: Three times a day (TID) | INTRAMUSCULAR | Status: AC | PRN
  Administered 2022-03-19 – 2022-03-20 (×4): 15 mg via INTRAVENOUS
  Filled 2022-03-19 (×4): qty 1

## 2022-03-19 MED ORDER — DILTIAZEM HCL ER COATED BEADS 180 MG PO CP24
360.0000 mg | ORAL_CAPSULE | Freq: Every day | ORAL | Status: DC
  Administered 2022-03-19 – 2022-03-21 (×3): 360 mg via ORAL
  Filled 2022-03-19 (×3): qty 2

## 2022-03-19 MED ORDER — METOPROLOL SUCCINATE ER 100 MG PO TB24
100.0000 mg | ORAL_TABLET | Freq: Every morning | ORAL | Status: DC
  Administered 2022-03-19 – 2022-03-22 (×4): 100 mg via ORAL
  Filled 2022-03-19 (×4): qty 1

## 2022-03-19 MED ORDER — ONDANSETRON HCL 4 MG/2ML IJ SOLN
4.0000 mg | Freq: Four times a day (QID) | INTRAMUSCULAR | Status: DC | PRN
  Administered 2022-03-19: 4 mg via INTRAVENOUS
  Filled 2022-03-19: qty 2

## 2022-03-19 NOTE — Progress Notes (Signed)
PT Cancellation Note  Patient Details Name: Tara Bradley MRN: [id number] DOB: Apr 23, 1923   Cancelled Treatment:    Reason Eval/Treat Not Completed: Patient at procedure or test/unavailable Went to xray. RN states pt is maxed out on pain meds and reporting 10/10 pain.   Chipper Oman, SPT   Reigna Ruperto 03/19/2022, 11:52 AM

## 2022-03-19 NOTE — TOC Progression Note (Signed)
Transition of Care [facility]) - Progression Note    Patient Details  Name: Tara Bradley MRN: [id number] Date of Birth: 04-11-1923  Transition of Care [facility]) CM/SW Contact  Zenon Mayo, RN Phone Number: 03/19/2022, 2:51 PM  Clinical Narrative:    from home alone, daughter lives in [place] , she is her only support.  Ankle fracture, awaiting pt/ot eval.  Cardizem drip changed to po. TOC following.        Expected Discharge Plan and Services                                                 Social Determinants of Health (SDOH) Interventions    Readmission Risk Interventions    05/11/2021    1:11 PM  Readmission Risk Prevention Plan  Transportation Screening Complete  Home Care Screening Complete

## 2022-03-19 NOTE — Evaluation (Signed)
Physical Therapy Evaluation Patient Details Name: Tara Bradley MRN: [id number] DOB: [birth year] Today's Date: 03/19/2022  History of Present Illness  Pt is 88 y/o F admitted to [facility] on 03/18/22 after a fall resulting in R ankle bimalleolar fx NWB, opted out of surgery. PMH of Afib with RVR, DM, CHF, HLD, HTN,. thoracic aortic dissesction, L femur IMN, R TSA   Clinical Impression  Pt presents with an overall decrease in functional mobility secondary to above. PTA, pt mod independent for all mobility with rollator, did get as needed assistance from aide for household chores. Educ on precautions and importance of mobility. Today, pt able to perform bed mobility and sit EOB with minA for R LE negotiation. Pt able to perform R LE AROM at hip and knee without increasing of pain. Mobility not progressed further today due to nausea and reports of dizziness. Pt would benefit from continued acute PT services to maximize functional mobility and independence prior to d/c with SNF level therapies.  Pre mobility HR 117 bpm During mobility HR up to 132 bpm      Recommendations for follow up therapy are one component of a multi-disciplinary discharge planning process, led by the attending physician.  Recommendations may be updated based on patient status, additional functional criteria and insurance authorization.  Follow Up Recommendations Skilled nursing-short term rehab (<3 hours/day) Can patient physically be transported by private vehicle: No    Assistance Recommended at Discharge Frequent or constant Supervision/Assistance  Patient can return home with the following  A lot of help with walking and/or transfers;A lot of help with bathing/dressing/bathroom;Assistance with cooking/housework;Assist for transportation;Help with stairs or ramp for entrance    Equipment Recommendations Wheelchair (measurements PT)  Recommendations for Other Services       Functional Status Assessment Patient has had a  recent decline in their functional status and demonstrates the ability to make significant improvements in function in a reasonable and predictable amount of time.     Precautions / Restrictions Precautions Precautions: Fall Precaution Comments: watch HR Restrictions Weight Bearing Restrictions: Yes RLE Weight Bearing: Non weight bearing      Mobility  Bed Mobility Overal bed mobility: Needs Assistance Bed Mobility: Supine to Sit, Sit to Supine     Supine to sit: HOB elevated, Min assist Sit to supine: Min assist, HOB elevated   General bed mobility comments: Min A with increased time, verbal cues for breaking down task, and intermittent guidance of R LE due to pain. Performed scooting ant/post and lateral in bed mod ind with use of handrails    Transfers                        Ambulation/Gait                  Stairs            Wheelchair Mobility    Modified Rankin (Stroke Patients Only)       Balance Overall balance assessment: Needs assistance Sitting-balance support: Feet supported, Bilateral upper extremity supported Sitting balance-Leahy Scale: Good Sitting balance - Comments: Prolonged static sitting EOB with ability to weight shift side to side supervision-min guard, pump arms up and down due to reported dizziness. Did experience one close posterior LOB, was able to correct before too far outside BOS.  Pertinent Vitals/Pain Pain Assessment Pain Assessment: Faces Faces Pain Scale: Hurts little more Pain Location: R ankle during movement Pain Descriptors / Indicators: Grimacing, Guarding Pain Intervention(s): Monitored during session, Repositioned    Home Living Family/patient expects to be discharged to:: Private residence Living Arrangements: Alone Available Help at Discharge: Friend(s);Available PRN/intermittently Type of Home: Apartment Home Access: Level entry       Home  Layout: One level Home Equipment: Conservation officer, nature (2 wheels);Cane - single point;Shower seat;Rollator (4 wheels);BSC/3in1;Wheelchair - manual Additional Comments: w/c is her husbands and is too wide for her    Prior Function Prior Level of Function : Needs assist       Physical Assist : ADLs (physical)   ADLs (physical): IADLs Mobility Comments: Uses the rollator for all mobility. ADLs Comments: Hired aide to come assist PRN.  4 hours/day for 4 days/week.  Assists with cleaning, laundry, transports to appointments.     Hand Dominance   Dominant Hand: Right    Extremity/Trunk Assessment   Upper Extremity Assessment Upper Extremity Assessment: Generalized weakness    Lower Extremity Assessment Lower Extremity Assessment: RLE deficits/detail RLE Deficits / Details: NWB due to non operated ankle fx RLE: Unable to fully assess due to immobilization    Cervical / Trunk Assessment Cervical / Trunk Assessment: Normal  Communication   Communication: No difficulties  Cognition Arousal/Alertness: Awake/alert Behavior During Therapy: WFL for tasks assessed/performed Overall Cognitive Status: Within Functional Limits for tasks assessed Area of Impairment: Following commands, Problem solving                       Following Commands: Follows one step commands consistently, Follows multi-step commands consistently, Follows multi-step commands with increased time     Problem Solving: Slow processing, Requires verbal cues, Requires tactile cues General Comments: Pt AOx4, able to recall precautions for R LE, did require frequent stimulation to keep pt engaged in session (reports of extreme nausea upon entry)        General Comments General comments (skin integrity, edema, etc.): HR 117 bpm at rest, up to 132 bpm during mobility. SpO2 >94% on 2L HFNC. Pt highly involved in music groups.    Exercises General Exercises - Lower Extremity Long Arc Quad: AROM, Both, 10 reps    Assessment/Plan    PT Assessment Patient needs continued PT services  PT Problem List Decreased strength;Decreased range of motion;Decreased activity tolerance;Decreased balance;Decreased mobility;Cardiopulmonary status limiting activity;Decreased coordination       PT Treatment Interventions Balance training;DME instruction;Gait training;Functional mobility training;Patient/family education;Therapeutic activities;Therapeutic exercise    PT Goals (Current goals can be found in the Care Plan section)  Acute Rehab PT Goals Patient Stated Goal: to be in less pain PT Goal Formulation: With patient Time For Goal Achievement: 04/02/22 Potential to Achieve Goals: Good    Frequency Min 3X/week     Co-evaluation               AM-PAC PT "6 Clicks" Mobility  Outcome Measure Help needed turning from your back to your side while in a flat bed without using bedrails?: A Little Help needed moving from lying on your back to sitting on the side of a flat bed without using bedrails?: A Little Help needed moving to and from a bed to a chair (including a wheelchair)?: Total Help needed standing up from a chair using your arms (e.g., wheelchair or bedside chair)?: Total Help needed to walk in hospital room?: Total Help needed climbing 3-5  steps with a railing? : Total 6 Click Score: 10    End of Session   Activity Tolerance: Patient limited by pain;Patient limited by fatigue Patient left: in bed;with call bell/phone within reach;with bed alarm set;with nursing/sitter in room Nurse Communication: Mobility status;Other (comment) (naseous) PT Visit Diagnosis: Other abnormalities of gait and mobility (R26.89);Repeated falls (R29.6);Muscle weakness (generalized) (M62.81)    Time: 1430-1456 PT Time Calculation (min) (ACUTE ONLY): 26 min   Charges:   PT Evaluation $PT Eval Moderate Complexity: 1 Mod        [address] [place], SPT   [place] Mikka Kissner 03/19/2022, 5:16 PM

## 2022-03-19 NOTE — Progress Notes (Signed)
Orthopedic Surgery Progress Note   Assessment: Patient is a 88 y.o. female with right ankle fracture, treating non-operatively   Plan: -No acute operative intervention -Adjusted splint and elevated leg on blankets, pain improved -Okay for diet and dvt ppx from ortho perspective -Weight bearing status: NWB RLE in splint -PT/OT evaluate and treat -Pain control -Anticipate discharge to SNF  ___________________________________________________________________________  Subjective: No acute events overnight. Made it the floor overnight. Having pain in her right ankle. States it was better when elevated on pillows. The blankets on the floor are too hard. Denies paresthesias and numbness. No pain outside of right ankle.    Physical Exam:  General: no acute distress, appears stated age Neurologic: alert, answering questions appropriately, following commands Respiratory: unlabored breathing on room air, symmetric chest rise Psychiatric: appropriate affect, normal cadence to speech  MSK:   -Right lower extremity  Short leg splint in place Fires hip flexors, quadriceps, hamstrings, tibialis anterior, gastrocnemius and soleus, extensor hallucis longus Plantarflexes and dorsiflexes toes Sensation intact to light touch in sural, saphenous, tibial, deep peroneal, and superficial peroneal nerve distributions Foot warm and well perfused   Patient name: Tara Bradley Patient MRN: [id number] Date: 03/19/22

## 2022-03-19 NOTE — Progress Notes (Signed)
PROGRESS NOTE    ANAISE STERBENZ  SKA:[id number] DOB: 04-Nov-1922 DOA: 03/18/2022 PCP: [name], MD   Brief Narrative:  HPI: Tara Bradley is a 88 y.o. female with medical history significant of chronic atrial fibrillation on xarelto, T2DM, HTN, HLD, diastolic CHF, insomnia, hx of breast cancer, chronic respiratory failure on 4L oxygen PRN, aortic aneurysm with chronic descending dissection who presented to ED after a fall. She was using an electric wheelchair and was trying to go between the door and hit the side of the door frame. Her foot was there and got stuck and the WC kept going and twisted her ankle. She didn't fall out of the chair. Her foot was just stuck until she was able to reverse the chair. She had 10/10 pain in her right ankle. She is on xarelto and last took in on 10/24 in the pm.    She was just getting over cellulitis of her lower legs. She was on oral antibiotics for this.    She has been feeling good. Denies any fever/chills, vision changes/headaches, chest pain or palpitations, shortness of breath or cough, abdominal pain, N/V/D, dysuria or leg swelling.      She does not smoke or drink alcohol.    ER Course:  afebrile, bp: 115/85, HR: 139, RR: 23 oxygen 98% [place] Pertinent labs: hgb: 10.3,  CXR: ill defined opacity within left lung base. Bilateral pleural effusion. Trace right/small left. Cardiomegaly.  Tib/fib: minimally displaced oblique fx of the distal fibular diaphysis and non displaced intra-articular fracture of the medial malleolus.  In ED: ortho consulted. Given '30mg'$  of cardizem and started on cardizem gtt. [facility] asked to admit.     Assessment & Plan:   Principal Problem:   Bimalleolar fracture of right ankle Active Problems:   Chronic atrial fibrillation with RVR (HCC)   Uncontrolled type 2 diabetes mellitus with hyperglycemia, with long-term current use of insulin (HCC)   Chronic diastolic CHF (congestive heart failure) (HCC)   Essential  hypertension   Chronic hypoxemic respiratory failure (HCC)   Hyperlipidemia   Recurrent cellulitis of lower extremity  Bimalleolar fracture of right ankle: Seen by orthopedics.  Patient refused surgical intervention.  Splint was placed by orthopedics.  They recommend PT OT which I have ordered.  Nonweightbearing in right lower extremity in a splint.  Pain is fairly controlled but patient does not like to take opiates.  We will give her a few dose of Toradol.   Chronic atrial fibrillation with RVR (HCC) CHA2DS2-Vasc score of 5.  She was started on Cardizem drip.  Currently her heart rate is ranging anywhere from 105-130.  She tells me that even at home, she runs around 130 without having any symptoms.  I will resume her oral Cardizem and oral beta-blocker and slowly wean off from Cardizem drip by this afternoon, if recurrent problem with RVR, will consult cardiology.  Continue Xarelto.  Uncontrolled type 2 diabetes mellitus with hyperglycemia, with long-term current use of insulin (HCC) A1C 6.9 per recent cardiology note.  Hold metformin, continue Lantus 8 units and SSI.   Chronic diastolic CHF (congestive heart failure) (HCC)/chronic hypoxic respiratory failure: Patient appears euvolemic, chest x-ray with chronic findings and cardiomegaly.  No crackles on the lung exam.  Slightly elevated BNP.  Patient on Lasix 80 mg p.o. twice daily at home which we have resumed.  Continue metoprolol as well.  She tells me that she was using 2 L of oxygen which she has been off for the  last 3 weeks but has been using intermittently.  She is currently on 2 L of oxygen.   Essential hypertension Well controlled.  Continue beta-blocker and Cardizem.  Recurrent cellulitis of lower extremity Has been on 2 rounds of RLE cellulitis. Completed both courses.  She just had leg splinted and wrapped for fracture so can not examine. She tells me it has improved.  ? Venous stasis changes from swelling vs. True cellulitis She  has no leukocytosis or other systemic signs Will need exam when can take off splint  -right LE doppler, negative for DVT   DVT prophylaxis:   Xarelto   Code Status: DNR  Family Communication:  None present at bedside.  Plan of care discussed with patient in length and he/she verbalized understanding and agreed with it.  Status is: Inpatient Remains inpatient appropriate because: Needs more pain control and is still on IV Cardizem.   Estimated body mass index is 24.39 kg/m as calculated from the following:   Height as of this encounter: 5' 5.5" (1.664 m).   Weight as of this encounter: 67.5 kg.    Nutritional Assessment: Body mass index is 24.39 kg/m.[name] Seen by dietician.  I agree with the assessment and plan as outlined below: Nutrition Status:        . Skin Assessment: I have examined the patient's skin and I agree with the wound assessment as performed by the wound care RN as outlined below:    Consultants:  Orthopedics  Procedures:  None  Antimicrobials:  Anti-infectives (From admission, onward)    None         Subjective: Patient seen and examined.  She was complaining of right lower extremity pain.  No other complaint.  Denied any chest pain, shortness of breath or palpitation.  Objective: Vitals:   03/19/22 0400 03/19/22 0517 03/19/22 0800 03/19/22 0931  BP: 91/66  105/66 118/79  Pulse: (!) 119 (!) 118 (!) 116   Resp: '20 20 20   '$ Temp: 98.5 F (36.9 C)  98.6 F (37 C)   TempSrc: Oral  Oral   SpO2: 94% 93% 90%   Weight:      Height:        Intake/Output Summary (Last 24 hours) at 03/19/2022 1023 Last data filed at 03/19/2022 0800 Gross per 24 hour  Intake 120 ml  Output 250 ml  Net -130 ml   Filed Weights   03/19/22 0000  Weight: 67.5 kg    Examination:  General exam: Appears calm and comfortable  Respiratory system: Clear to auscultation. Respiratory effort normal. Cardiovascular system: S1 & S2 heard, irregularly irregular rate and  rhythm. No JVD, murmurs, rubs, gallops or clicks. No pedal edema. Gastrointestinal system: Abdomen is nondistended, soft and nontender. No organomegaly or masses felt. Normal bowel sounds heard. Central nervous system: Alert and oriented. No focal neurological deficits. Extremities: Splint in right lower extremity. Psychiatry: Judgement and insight appear normal. Mood & affect appropriate.    Data Reviewed: I have personally reviewed following labs and imaging studies  CBC: Recent Labs  Lab 03/18/22 0841 03/19/22 0024  WBC 7.0 6.5  HGB 10.3* 10.6*  HCT 32.5* 32.0*  MCV 95.6 93.3  PLT 186 841   Basic Metabolic Panel: Recent Labs  Lab 03/18/22 0841 03/19/22 0024  NA 137 138  K 4.3 3.8  CL 103 96*  CO2 24 29  GLUCOSE 255* 135*  BUN 23 20  CREATININE 0.89 0.91  CALCIUM 8.5* 9.0  MG 1.7  --  GFR: Estimated Creatinine Clearance: 32.5 mL/min (by C-G formula based on SCr of 0.91 mg/dL). Liver Function Tests: No results for input(s): "AST", "ALT", "ALKPHOS", "BILITOT", "PROT", "ALBUMIN" in the last 168 hours. No results for input(s): "LIPASE", "AMYLASE" in the last 168 hours. No results for input(s): "AMMONIA" in the last 168 hours. Coagulation Profile: No results for input(s): "INR", "PROTIME" in the last 168 hours. Cardiac Enzymes: No results for input(s): "CKTOTAL", "CKMB", "CKMBINDEX", "TROPONINI" in the last 168 hours. BNP (last 3 results) No results for input(s): "PROBNP" in the last 8760 hours. HbA1C: No results for input(s): "HGBA1C" in the last 72 hours. CBG: Recent Labs  Lab 03/18/22 1817 03/19/22 0616  GLUCAP 247* 141*   Lipid Profile: No results for input(s): "CHOL", "HDL", "LDLCALC", "TRIG", "CHOLHDL", "LDLDIRECT" in the last 72 hours. Thyroid Function Tests: Recent Labs    03/19/22 0024  TSH 4.251   Anemia Panel: No results for input(s): "VITAMINB12", "FOLATE", "FERRITIN", "TIBC", "IRON", "RETICCTPCT" in the last 72 hours. Sepsis Labs: No  results for input(s): "PROCALCITON", "LATICACIDVEN" in the last 168 hours.  Recent Results (from the past 240 hour(s))  MRSA Next Gen by PCR, Nasal     Status: None   Collection Time: 03/19/22 12:01 AM   Specimen: Nasal Mucosa; Nasal Swab  Result Value Ref Range Status   MRSA by PCR Next Gen NOT DETECTED NOT DETECTED Final    Comment: (NOTE) The GeneXpert MRSA Assay (FDA approved for NASAL specimens only), is one component of a comprehensive MRSA colonization surveillance program. It is not intended to diagnose MRSA infection nor to guide or monitor treatment for MRSA infections. Test performance is not FDA approved in patients less than 38 years old. Performed at [facility], [place] [address], [place], [place] [address]      Radiology Studies: DG Ankle 2 Views Right  Result Date: 03/18/2022 CLINICAL DATA:  Pain.  Post splinting. EXAM: RIGHT ANKLE - 2 VIEW COMPARISON:  03/18/2022 FINDINGS: Images obtained through cast material which may obscure bone detail. There is a minimally oblique fracture of the distal right fibular shaft with minimal medial displacement. Alignment is unchanged since prior study. Mildly displaced medial malleolar fractures also without significant change. Degenerative changes in the intertarsal joints. IMPRESSION: Fractures of the distal right fibula and medial malleolus without significant change in alignment post casting. Electronically Signed   By: Lucienne Capers M.D.   On: 03/18/2022 20:39   VAS [place] LOWER EXTREMITY VENOUS (DVT) (ONLY [facility] & [facility])  Result Date: 03/18/2022  Lower Venous DVT Study Patient Name:  SHEELA MCCULLEY  Date of Exam:   03/18/2022 Medical Rec #: [id number]         Accession #:    [id number] Date of Birth: [birth year]          Patient Gender: F Patient Age:   26 years Exam Location:  [facility] Procedure:      VAS [place] LOWER EXTREMITY VENOUS (DVT) Referring Phys: Thelma Comp NANAVATI  --------------------------------------------------------------------------------  Indications: Pain, Swelling, and Erythema. Patient got foot caught in mobile scooter yesterday.  Limitations: Edema and pain with touch. Comparison Study: No prior study on file Performing Technologist: Sharion Dove RVS  Examination Guidelines: A complete evaluation includes B-mode imaging, spectral Doppler, color Doppler, and power Doppler as needed of all accessible portions of each vessel. Bilateral testing is considered an integral part of a complete examination. Limited examinations for reoccurring indications may be performed as noted. The reflux portion of the exam  is performed with the patient in reverse Trendelenburg.  +---------+---------------+---------+-----------+----------+-------------------+ RIGHT    CompressibilityPhasicitySpontaneityPropertiesThrombus Aging      +---------+---------------+---------+-----------+----------+-------------------+ CFV      Full           Yes      Yes                                      +---------+---------------+---------+-----------+----------+-------------------+ SFJ      Full                                                             +---------+---------------+---------+-----------+----------+-------------------+ FV Prox  Full                                                             +---------+---------------+---------+-----------+----------+-------------------+ FV Mid   Full                                                             +---------+---------------+---------+-----------+----------+-------------------+ FV DistalFull                                                             +---------+---------------+---------+-----------+----------+-------------------+ PFV      Full                                                             +---------+---------------+---------+-----------+----------+-------------------+ POP       Full           Yes      Yes                                      +---------+---------------+---------+-----------+----------+-------------------+ PTV      Full                                         patent by color and                                                       Doppler             +---------+---------------+---------+-----------+----------+-------------------+ PERO  Full                                                             +---------+---------------+---------+-----------+----------+-------------------+   +----+---------------+---------+-----------+----------+--------------+ LEFTCompressibilityPhasicitySpontaneityPropertiesThrombus Aging +----+---------------+---------+-----------+----------+--------------+ CFV Full           Yes      Yes                                 +----+---------------+---------+-----------+----------+--------------+    Summary: RIGHT: - There is no evidence of deep vein thrombosis in the lower extremity.  - No cystic structure found in the popliteal fossa. - Ultrasound characteristics of enlarged lymph nodes are noted in the groin.  LEFT: - No evidence of common femoral vein obstruction.  *See table(s) above for measurements and observations. Electronically signed by Orlie Pollen on 03/18/2022 at 2:39:54 PM.    Final    DG Tibia/Fibula Right  Result Date: 03/18/2022 CLINICAL DATA:  Foot pain with erythema and swelling after injury yesterday. EXAM: RIGHT TIBIA AND FIBULA - 2 VIEW; RIGHT ANKLE - COMPLETE 3+ VIEW COMPARISON:  None Available. FINDINGS: The bones are diffusely demineralized. There is a minimally displaced oblique fracture of the distal fibular diaphysis. There is a nondisplaced intra-articular fracture involving the base of the medial malleolus. No significant widening of the ankle mortise, dislocation or tarsal bone fracture identified. No acute injuries are identified within the proximal lower leg. There are  advanced degenerative changes at the knee with prominent medial compartment osteophytes. Right femoral intramedullary nail is partially imaged. There are edematous changes throughout the soft tissues without evidence of foreign body or soft tissue emphysema. Scattered vascular calcifications are noted. IMPRESSION: Minimally displaced oblique fracture of the distal fibular diaphysis and nondisplaced intra-articular fracture of the medial malleolus. Electronically Signed   By: Richardean Sale M.D.   On: 03/18/2022 09:11   DG Ankle Complete Right  Result Date: 03/18/2022 CLINICAL DATA:  Foot pain with erythema and swelling after injury yesterday. EXAM: RIGHT TIBIA AND FIBULA - 2 VIEW; RIGHT ANKLE - COMPLETE 3+ VIEW COMPARISON:  None Available. FINDINGS: The bones are diffusely demineralized. There is a minimally displaced oblique fracture of the distal fibular diaphysis. There is a nondisplaced intra-articular fracture involving the base of the medial malleolus. No significant widening of the ankle mortise, dislocation or tarsal bone fracture identified. No acute injuries are identified within the proximal lower leg. There are advanced degenerative changes at the knee with prominent medial compartment osteophytes. Right femoral intramedullary nail is partially imaged. There are edematous changes throughout the soft tissues without evidence of foreign body or soft tissue emphysema. Scattered vascular calcifications are noted. IMPRESSION: Minimally displaced oblique fracture of the distal fibular diaphysis and nondisplaced intra-articular fracture of the medial malleolus. Electronically Signed   By: Richardean Sale M.D.   On: 03/18/2022 09:11   DG Chest Port 1 View  Result Date: 03/18/2022 CLINICAL DATA:  Provided history: Shortness of breath, blunt trauma to medial ankle and distal leg. EXAM: PORTABLE CHEST 1 VIEW COMPARISON:  Prior chest radiographs 01/15/2022 and earlier. Chest CT 02/10/2022. FINDINGS:  Cardiomegaly. Aortic atherosclerosis. Bilateral pleural effusions (trace right, small left). Ill-defined opacity within the left lung base. No appreciable airspace consolidation on the right. No evidence  of pneumothorax. No acute bony abnormality identified. Chronic bilateral rib fracture deformities. Prominent dextrocurvature of the thoracic spine. Prior right shoulder arthroplasty. Soft tissue anchor within the proximal left humerus. IMPRESSION: 1. Ill-defined opacity within the left lung base. While this may reflect atelectasis, pneumonia is difficult to exclude. 2. Bilateral pleural effusions (trace right, small left). 3. Cardiomegaly. 4. Aortic Atherosclerosis (ICD10-I70.0). 5. Prominent dextrocurvature of the thoracic spine. Electronically Signed   By: Kellie Simmering D.O.   On: 03/18/2022 09:09    Scheduled Meds:  diltiazem  360 mg Oral Daily   ferrous gluconate  324 mg Oral BID WC   furosemide  80 mg Oral BID   insulin aspart  0-9 Units Subcutaneous TID WC   insulin glargine-yfgn  8 Units Subcutaneous Daily   metoprolol succinate  100 mg Oral q morning   potassium chloride SA  20 mEq Oral Daily   Rivaroxaban  15 mg Oral Q supper   senna  1 tablet Oral BID   sodium chloride flush  3 mL Intravenous Q12H   tamsulosin  0.4 mg Oral Daily   Continuous Infusions:  sodium chloride       LOS: 1 day   Darliss Cheney, MD [facility]  03/19/2022, 10:23 AM   *Please note that this is a verbal dictation therefore any spelling or grammatical errors are due to the "[place] [employer]" system interpretation.  Please page via [place] and do not message via secure chat for urgent patient care matters. Secure chat can be used for non urgent patient care matters.  How to contact the [facility] Attending or Consulting provider [place] or covering provider during after hours [place], for this patient?  Check the care team in [facility] and look for a) attending/consulting [facility] provider listed and b) the [facility] team  listed. Page or secure chat [phone]. Log into [email or web address] and use [place] universal password to access. If you do not have the password, please contact the hospital operator. Locate the [facility] provider you are looking for under [facility] and page to a number that you can be directly reached. If you still have difficulty reaching the provider, please page the [facility] (Director on Call) for the Hospitalists listed on amion for assistance.

## 2022-03-19 NOTE — Evaluation (Signed)
Occupational Therapy Evaluation Patient Details Name: Tara Bradley MRN: [id number] DOB: [birth year] Today's Date: 03/19/2022   History of Present Illness Pt is 88 y/o F admitted to [facility] on 03/18/22 after a fall resulting in R ankle bimalleolar fx NWB, opted out of surgery. PMH of Afib with RVR, DM, CHF, HLD, HTN,. thoracic aortic dissesction, L femur IMN, R TSA   Clinical Impression   Patient admitted for the diagnosis above.  PTA she lives alone in a one level apartment with an elevator from her underground parking.  She has an aide 4 days a week for household duties and community mobility, although the patient states she continues to drive.  She uses a RW at baseline, but was completing her own ADL from a sit to stand level.  Deficits impacting independence are listed below.  Currently she is needing up to Mod A for basic mobility and ADL completion.  OT will follow in the acute setting, but she would greatly benefit from short term rehab at a local SNF to become Mod I at wheelchair level.  The patient is unable to stand pivot without placing pressure through her R leg.  She can scoot laterally, but needs more time to learn these skills and assure she has the appropriate DME at home.        Recommendations for follow up therapy are one component of a multi-disciplinary discharge planning process, led by the attending physician.  Recommendations may be updated based on patient status, additional functional criteria and insurance authorization.   Follow Up Recommendations  Skilled nursing-short term rehab (<3 hours/day)    Assistance Recommended at Discharge Frequent or constant Supervision/Assistance  Patient can return home with the following Assistance with cooking/housework;Assist for transportation;Help with stairs or ramp for entrance;A lot of help with bathing/dressing/bathroom;A lot of help with walking and/or transfers    Functional Status Assessment  Patient has had a recent decline  in their functional status and demonstrates the ability to make significant improvements in function in a reasonable and predictable amount of time.  Equipment Recommendations  Wheelchair cushion (measurements OT);Wheelchair (measurements OT)    Recommendations for Other Services       Precautions / Restrictions Precautions Precautions: Fall Precaution Comments: watch HR Restrictions Weight Bearing Restrictions: Yes RLE Weight Bearing: Non weight bearing      Mobility Bed Mobility Overal bed mobility: Needs Assistance Bed Mobility: Supine to Sit, Sit to Supine     Supine to sit: HOB elevated, Min assist Sit to supine: Min assist, HOB elevated   General bed mobility comments: assist with R leg back onto the bed.  Assist with trunk to sit up    Transfers Overall transfer level: Needs assistance Equipment used: Rolling walker (2 wheels) Transfers: Sit to/from Stand Sit to Stand: Mod assist, From elevated surface           General transfer comment: Able to lateral scoot to [facility] with supervision seated.  Increased pressure through R foot with attempted lateral scoot at RW level.      Balance Overall balance assessment: Needs assistance Sitting-balance support: Feet supported, Bilateral upper extremity supported Sitting balance-Leahy Scale: Good     Standing balance support: Reliant on assistive device for balance Standing balance-Leahy Scale: Poor                             ADL either performed or assessed with clinical judgement   ADL Overall ADL's :  Needs assistance/impaired Eating/Feeding: Set up;Bed level   Grooming: Wash/dry hands;Wash/dry face;Set up;Bed level   Upper Body Bathing: Supervision/ safety;Sitting   Lower Body Bathing: Minimal assistance;Sitting/lateral leans;Bed level   Upper Body Dressing : Supervision/safety;Sitting   Lower Body Dressing: Moderate assistance;Sitting/lateral leans                       Vision  Patient Visual Report: No change from baseline       Perception Perception Perception: Within Functional Limits   Praxis Praxis Praxis: Intact    Pertinent Vitals/Pain Pain Assessment Pain Assessment: Faces Faces Pain Scale: Hurts little more Pain Location: R ankle during movement Pain Descriptors / Indicators: Grimacing, Guarding Pain Intervention(s): Monitored during session     Hand Dominance Right   Extremity/Trunk Assessment Upper Extremity Assessment Upper Extremity Assessment: Overall WFL for tasks assessed   Lower Extremity Assessment Lower Extremity Assessment: Defer to PT evaluation RLE Deficits / Details: NWB due to non operated ankle fx RLE: Unable to fully assess due to immobilization   Cervical / Trunk Assessment Cervical / Trunk Assessment: Normal   Communication Communication Communication: No difficulties   Cognition Arousal/Alertness: Lethargic, Suspect due to medications Behavior During Therapy: WFL for tasks assessed/performed Overall Cognitive Status: Within Functional Limits for tasks assessed                               Problem Solving: Slow processing       General Comments  HR 117 bpm at rest, up to 132 bpm during mobility. SpO2 >93% on 2L HFNC. Pt highly involved in music groups.    Exercises     Shoulder Instructions      Home Living Family/patient expects to be discharged to:: Private residence Living Arrangements: Alone Available Help at Discharge: Friend(s);Available PRN/intermittently Type of Home: Apartment Home Access: Level entry     Home Layout: One level     Bathroom Shower/Tub: Occupational psychologist: Handicapped height Bathroom Accessibility: Yes How Accessible: Accessible via walker Home Equipment: Brent (2 wheels);Cane - single point;Shower seat;Rollator (4 wheels);BSC/3in1;Wheelchair - manual   Additional Comments: w/c is her husbands and is too wide for her      Prior  Functioning/Environment Prior Level of Function : Needs assist       Physical Assist : ADLs (physical)   ADLs (physical): IADLs Mobility Comments: Uses the rollator for all mobility.  Patient stated to OT her RW has 2 wheels. ADLs Comments: Hired aide to come assist PRN.  4 hours/day for 4 days/week.  Assists with cleaning, laundry, transports to appointments.        OT Problem List: Decreased strength;Decreased range of motion;Decreased activity tolerance;Impaired balance (sitting and/or standing);Decreased safety awareness      OT Treatment/Interventions: Self-care/ADL training;Therapeutic activities;DME and/or AE instruction;Balance training;Patient/family education    OT Goals(Current goals can be found in the care plan section) Acute Rehab OT Goals Patient Stated Goal: Increase independence OT Goal Formulation: With patient Time For Goal Achievement: 04/02/22 Potential to Achieve Goals: Good ADL Goals Pt Will Perform Lower Body Dressing: with supervision;with adaptive equipment;sitting/lateral leans Pt Will Transfer to Toilet: with min assist;stand pivot transfer;bedside commode Additional ADL Goal #1: Setup for lateral scoot to wheelchair for Mod I at w/c level ADL  OT Frequency: Min 2X/week    Co-evaluation              AM-PAC OT "  6 Clicks" Daily Activity     Outcome Measure Help from another person eating meals?: None Help from another person taking care of personal grooming?: None Help from another person toileting, which includes using toliet, bedpan, or urinal?: A Lot Help from another person bathing (including washing, rinsing, drying)?: A Lot Help from another person to put on and taking off regular upper body clothing?: A Little Help from another person to put on and taking off regular lower body clothing?: A Lot 6 Click Score: 17   End of Session Equipment Utilized During Treatment: Gait belt;Rolling walker (2 wheels) Nurse Communication: Mobility  status  Activity Tolerance: Patient limited by fatigue Patient left: in bed;with call bell/phone within reach;with bed alarm set  OT Visit Diagnosis: Unsteadiness on feet (R26.81);Muscle weakness (generalized) (M62.81);Pain Pain - Right/Left: Right Pain - part of body: Leg                Time: 1540-1605 OT Time Calculation (min): 25 min Charges:  OT General Charges $OT Visit: 1 Visit OT Evaluation $OT Eval Moderate Complexity: 1 Mod OT Treatments $Therapeutic Activity: 8-22 mins  03/19/2022  RP, OTR/L  Acute Rehabilitation Services  Office:  [phone]   Metta Clines 03/19/2022, 4:28 PM

## 2022-03-20 DIAGNOSIS — S82841A Displaced bimalleolar fracture of right lower leg, initial encounter for closed fracture: Secondary | ICD-10-CM | POA: Diagnosis not present

## 2022-03-20 LAB — CBC WITH DIFFERENTIAL/PLATELET
Abs Immature Granulocytes: 0.03 10*3/uL (ref 0.00–0.07)
Basophils Absolute: 0 10*3/uL (ref 0.0–0.1)
Basophils Relative: 1 %
Eosinophils Absolute: 0.1 10*3/uL (ref 0.0–0.5)
Eosinophils Relative: 2 %
HCT: 30.5 % — ABNORMAL LOW (ref 36.0–46.0)
Hemoglobin: 9.8 g/dL — ABNORMAL LOW (ref 12.0–15.0)
Immature Granulocytes: 1 %
Lymphocytes Relative: 9 %
Lymphs Abs: 0.5 10*3/uL — ABNORMAL LOW (ref 0.7–4.0)
MCH: 30.2 pg (ref 26.0–34.0)
MCHC: 32.1 g/dL (ref 30.0–36.0)
MCV: 94.1 fL (ref 80.0–100.0)
Monocytes Absolute: 0.6 10*3/uL (ref 0.1–1.0)
Monocytes Relative: 9 %
Neutro Abs: 4.8 10*3/uL (ref 1.7–7.7)
Neutrophils Relative %: 78 %
Platelets: 174 10*3/uL (ref 150–400)
RBC: 3.24 MIL/uL — ABNORMAL LOW (ref 3.87–5.11)
RDW: 16.4 % — ABNORMAL HIGH (ref 11.5–15.5)
WBC: 6.1 10*3/uL (ref 4.0–10.5)
nRBC: 0 % (ref 0.0–0.2)

## 2022-03-20 LAB — BASIC METABOLIC PANEL
Anion gap: 7 (ref 5–15)
BUN: 27 mg/dL — ABNORMAL HIGH (ref 8–23)
CO2: 32 mmol/L (ref 22–32)
Calcium: 8.7 mg/dL — ABNORMAL LOW (ref 8.9–10.3)
Chloride: 99 mmol/L (ref 98–111)
Creatinine, Ser: 1.08 mg/dL — ABNORMAL HIGH (ref 0.44–1.00)
GFR, Estimated: 47 mL/min — ABNORMAL LOW (ref >60)
Glucose, Bld: 156 mg/dL — ABNORMAL HIGH (ref 70–99)
Potassium: 4.6 mmol/L (ref 3.5–5.1)
Sodium: 138 mmol/L (ref 135–145)

## 2022-03-20 LAB — GLUCOSE, CAPILLARY
Glucose-Capillary: 113 mg/dL — ABNORMAL HIGH (ref 70–99)
Glucose-Capillary: 148 mg/dL — ABNORMAL HIGH (ref 70–99)
Glucose-Capillary: 149 mg/dL — ABNORMAL HIGH (ref 70–99)
Glucose-Capillary: 155 mg/dL — ABNORMAL HIGH (ref 70–99)
Glucose-Capillary: 198 mg/dL — ABNORMAL HIGH (ref 70–99)

## 2022-03-20 MED ORDER — PROCHLORPERAZINE EDISYLATE 10 MG/2ML IJ SOLN
5.0000 mg | Freq: Once | INTRAMUSCULAR | Status: AC | PRN
  Administered 2022-03-20: 5 mg via INTRAVENOUS
  Filled 2022-03-20: qty 1

## 2022-03-20 MED ORDER — PROCHLORPERAZINE EDISYLATE 10 MG/2ML IJ SOLN: 5.0000 mg | Freq: Once | INTRAMUSCULAR | Status: DC | PRN

## 2022-03-20 MED ORDER — PROCHLORPERAZINE EDISYLATE 10 MG/2ML IJ SOLN: 5.0000 mg | Freq: Four times a day (QID) | INTRAMUSCULAR | Status: DC | PRN

## 2022-03-20 NOTE — Progress Notes (Addendum)
Orthopedic Surgery Progress Note   Assessment: Patient is a 88 y.o. female with right ankle fracture, treating non-operatively   Plan: -No acute operative intervention -Took down splint, placed additional padding around the heel and the medial aspect of the tibia. Medial side panel placed more posteriorly this time to keep pressure off the area of swelling. Not having much pain once this new splint was applied -Explained that by loosening the splint, it may not hold the ankle in position as well so we have to reconsider non-operative treatment but will monitor with XR -Ice and elevate the right leg greater than the level of the heart -Okay for diet and dvt ppx from ortho perspective -Weight bearing status: NWB RLE in splint -PT/OT evaluate and treat -Pain control -Anticipate discharge to SNF  ___________________________________________________________________________  Subjective: No acute events overnight. Having a lot of pain over her anterior shin on the right side. Not as much pain near the ankle. No falls or new injuries during this admission. Unable to take narcotics since they cause nausea. Tramadol, toradol, and tylenol have not been effective. Has been unable to elevate the leg since the blankets feel too hard under her leg. Denies paresthesias and numbness.    Physical Exam:  Afebrile, normotensive  General: no acute distress, appears stated age Neurologic: alert, answering questions appropriately, following commands Respiratory: unlabored breathing on room air, symmetric chest rise Psychiatric: appropriate affect, normal cadence to speech  MSK:   -Right lower extremity  Splint taken down and there was swelling and ecchymosis in the area where the medial panel of the short leg splint was rubbing on the skin. This was at the midshaft of the tibia. She had tenderness to touch in that area.  Fires hip flexors, quadriceps, hamstrings, tibialis anterior, gastrocnemius and  soleus, extensor hallucis longus Plantarflexes and dorsiflexes toes Sensation intact to light touch in sural, saphenous, tibial, deep peroneal, and superficial peroneal nerve distributions Foot warm and well perfused  WBC wnl  Patient name: Tara Bradley Patient MRN: [id number] Date: 03/20/22

## 2022-03-20 NOTE — Progress Notes (Addendum)
Mobility Specialist Progress Note   03/20/22 1152  Mobility  Activity Dangled on edge of bed  Level of Assistance Minimal assist, patient does 75% or more  Assistive Device Other (Comment) (HHA)  RLE Weight Bearing NWB  Activity Response Tolerated well  $Mobility charge 1 Mobility   Pre Mobility: 101 HR, 100/68 BP, 96% SpO2 on 1.5LO2 During Mobility: 126 HR, 90% SpO2 on 1.5LO2 Post Mobility: 86 HR, 71/54 BP, 90% SpO2 on 1.5LO2  Received pt in bed expressing a lot of pain in R foot (10/10) but w/ mod encouragement pt agreeable. Requiring minA to guard RLE during transition and to bring trunk upright to EOB. Pt tolerating transition well and expressing RLE to be feeling better once sitting up. Able to remain on EOB for ~77mns. Returned back supine w/ call bell in reach and bed alarm on. Pt in state of Afib entire session + presenting a low BP post mobility but expressing no symptoms. RN notified.   JHolland FallingMobility Specialist Acute Rehab Office:  [phone]
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
## 2022-03-20 NOTE — Progress Notes (Incomplete)
Pt said her pain was controlled but has come back. Pt was willing to take Tyl but refused other pain meds. RN elevated pt's R foot and gave Tylenol. Pt has red MEWs d/t HR (120s-140). RN paged MD and is continuing to monitor pt.

## 2022-03-20 NOTE — Progress Notes (Signed)
PROGRESS NOTE    Tara Bradley  PPJ:[id number] DOB: June 24, 1922 DOA: 03/18/2022 PCP: [name], MD   Brief Narrative:  HPI: Tara Bradley is a 88 y.o. female with medical history significant of chronic atrial fibrillation on xarelto, T2DM, HTN, HLD, diastolic CHF, insomnia, hx of breast cancer, chronic respiratory failure on 4L oxygen PRN, aortic aneurysm with chronic descending dissection who presented to ED after a fall. She was using an electric wheelchair and was trying to go between the door and hit the side of the door frame. Her foot was there and got stuck and the WC kept going and twisted her ankle. She didn't fall out of the chair. Her foot was just stuck until she was able to reverse the chair. She had 10/10 pain in her right ankle. She is on xarelto and last took in on 10/24 in the pm.    She was just getting over cellulitis of her lower legs. She was on oral antibiotics for this.    She has been feeling good. Denies any fever/chills, vision changes/headaches, chest pain or palpitations, shortness of breath or cough, abdominal pain, N/V/D, dysuria or leg swelling.      She does not smoke or drink alcohol.    ER Course:  afebrile, bp: 115/85, HR: 139, RR: 23 oxygen 98%  Pertinent labs: hgb: 10.3,  CXR: ill defined opacity within left lung base. Bilateral pleural effusion. Trace right/small left. Cardiomegaly.  Tib/fib: minimally displaced oblique fx of the distal fibular diaphysis and non displaced intra-articular fracture of the medial malleolus.  In ED: ortho consulted. Given '30mg'$  of cardizem and started on cardizem gtt. TRH asked to admit.     Assessment & Plan:   Principal Problem:   Bimalleolar fracture of right ankle Active Problems:   Chronic atrial fibrillation with RVR (HCC)   Uncontrolled type 2 diabetes mellitus with hyperglycemia, with long-term current use of insulin (HCC)   Chronic diastolic CHF (congestive heart failure) (HCC)   Essential  hypertension   Chronic hypoxemic respiratory failure (HCC)   Hyperlipidemia   Recurrent cellulitis of lower extremity  Bimalleolar fracture of right ankle: Seen by orthopedics.  Patient refused surgical intervention.  Splint was placed by orthopedics.  Nonweightbearing in right lower extremity in a splint.  Patient was complaining of pain and tight splint, splint was readjusted by orthopedics.  They still recommend possible intervention but patient continues to refuse that.  She feels better after readjusting and loosening of the splint.  PT OT recommends SNF.  TOC working on that.   Chronic atrial fibrillation with RVR (HCC) CHA2DS2-Vasc score of 5.  She was started on Cardizem drip upon admission but was transitioned back to oral home dose of Cardizem and beta-blocker.  A-fib controlled with intermittent breast up to 130 but per patient, this is her baseline.  Patient remains asymptomatic.  Continue Xarelto.  Uncontrolled type 2 diabetes mellitus with hyperglycemia, with long-term current use of insulin (HCC) A1C 6.9 per recent cardiology note.  Hold metformin, continue Lantus 8 units and SSI.   Chronic diastolic CHF (congestive heart failure) (HCC)/chronic hypoxic respiratory failure: Patient appears euvolemic, chest x-ray with chronic findings and cardiomegaly.  No crackles on the lung exam.  Slightly elevated BNP.  Patient on Lasix 80 mg p.o. twice daily at home which we have resumed.  Continue metoprolol as well.  She tells me that she was using 2 L of oxygen which she has been off for the last 3 weeks but has  been using intermittently.  She is currently on 2 L of oxygen.   Essential hypertension Well controlled.  Continue beta-blocker and Cardizem.  Recurrent cellulitis of lower extremity Has been on 2 rounds of RLE cellulitis. Completed both courses.  She just had leg splinted and wrapped for fracture so can not examine. She tells me it has improved.  ? Venous stasis changes from swelling  vs. True cellulitis She has no leukocytosis or other systemic signs Will need exam when can take off splint  -right LE doppler, negative for DVT   DVT prophylaxis:   Xarelto   Code Status: DNR  Family Communication:  None present at bedside.  Plan of care discussed with patient in length and he/she verbalized understanding and agreed with it.  Status is: Inpatient Remains inpatient appropriate because: Medically stable.  Pending SNF placement.   Estimated body mass index is 24.39 kg/m as calculated from the following:   Height as of this encounter: 5' 5.5" (1.664 m).   Weight as of this encounter: 67.5 kg.    Nutritional Assessment: Body mass index is 24.39 kg/m.[name] Seen by dietician.  I agree with the assessment and plan as outlined below: Nutrition Status:        . Skin Assessment: I have examined the patient's skin and I agree with the wound assessment as performed by the wound care RN as outlined below:    Consultants:  Orthopedics  Procedures:  None  Antimicrobials:  Anti-infectives (From admission, onward)    None         Subjective:  Seen and examined.  Right leg pain is improved since the splint is readjusted.  No other complaint.  Denies palpitation, chest pain or shortness of breath.  Objective: Vitals:   03/19/22 2351 03/20/22 0422 03/20/22 0730 03/20/22 0910  BP: (!) 87/58 101/77 98/81   Pulse: (!) 101 (!) 126 (!) 133 (!) 119  Resp: 16 15 (!) 23   Temp: 98 F (36.7 C) 98.4 F (36.9 C) 98.4 F (36.9 C)   TempSrc: Oral Oral Oral   SpO2: 96% 94% 93%   Weight:      Height:        Intake/Output Summary (Last 24 hours) at 03/20/2022 0937 Last data filed at 03/19/2022 1700 Gross per 24 hour  Intake 120 ml  Output --  Net 120 ml    Filed Weights   03/19/22 0000  Weight: 67.5 kg    Examination:  General exam: Appears calm and comfortable  Respiratory system: Clear to auscultation. Respiratory effort normal. Cardiovascular system:  S1 & S2 heard, irregularly irregular rate and rhythm. No JVD, murmurs, rubs, gallops or clicks. No pedal edema. Gastrointestinal system: Abdomen is nondistended, soft and nontender. No organomegaly or masses felt. Normal bowel sounds heard. Central nervous system: Alert and oriented. No focal neurological deficits. Extremities: Splint in the right lower extremity. Psychiatry: Judgement and insight appear normal. Mood & affect appropriate.   Data Reviewed: I have personally reviewed following labs and imaging studies  CBC: Recent Labs  Lab 03/18/22 0841 03/19/22 0024 03/20/22 0020  WBC 7.0 6.5 6.1  NEUTROABS  --   --  4.8  HGB 10.3* 10.6* 9.8*  HCT 32.5* 32.0* 30.5*  MCV 95.6 93.3 94.1  PLT 186 190 782    Basic Metabolic Panel: Recent Labs  Lab 03/18/22 0841 03/19/22 0024 03/20/22 0020  NA 137 138 138  K 4.3 3.8 4.6  CL 103 96* 99  CO2 24 29 32  GLUCOSE 255* 135* 156*  BUN 23 20 27*  CREATININE 0.89 0.91 1.08*  CALCIUM 8.5* 9.0 8.7*  MG 1.7  --   --     GFR: Estimated Creatinine Clearance: 27.4 mL/min (A) (by C-G formula based on SCr of 1.08 mg/dL (H)). Liver Function Tests: No results for input(s): "AST", "ALT", "ALKPHOS", "BILITOT", "PROT", "ALBUMIN" in the last 168 hours. No results for input(s): "LIPASE", "AMYLASE" in the last 168 hours. No results for input(s): "AMMONIA" in the last 168 hours. Coagulation Profile: No results for input(s): "INR", "PROTIME" in the last 168 hours. Cardiac Enzymes: No results for input(s): "CKTOTAL", "CKMB", "CKMBINDEX", "TROPONINI" in the last 168 hours. BNP (last 3 results) No results for input(s): "PROBNP" in the last 8760 hours. HbA1C: No results for input(s): "HGBA1C" in the last 72 hours. CBG: Recent Labs  Lab 03/19/22 1120 03/19/22 1621 03/19/22 2116 03/20/22 0020 03/20/22 0702  GLUCAP 168* 170* 200* 148* 113*    Lipid Profile: No results for input(s): "CHOL", "HDL", "LDLCALC", "TRIG", "CHOLHDL", "LDLDIRECT" in  the last 72 hours. Thyroid Function Tests: Recent Labs    03/19/22 0024  TSH 4.251    Anemia Panel: No results for input(s): "VITAMINB12", "FOLATE", "FERRITIN", "TIBC", "IRON", "RETICCTPCT" in the last 72 hours. Sepsis Labs: No results for input(s): "PROCALCITON", "LATICACIDVEN" in the last 168 hours.  Recent Results (from the past 240 hour(s))  MRSA Next Gen by PCR, Nasal     Status: None   Collection Time: 03/19/22 12:01 AM   Specimen: Nasal Mucosa; Nasal Swab  Result Value Ref Range Status   MRSA by PCR Next Gen NOT DETECTED NOT DETECTED Final    Comment: (NOTE) The GeneXpert MRSA Assay (FDA approved for NASAL specimens only), is one component of a comprehensive MRSA colonization surveillance program. It is not intended to diagnose MRSA infection nor to guide or monitor treatment for MRSA infections. Test performance is not FDA approved in patients less than 20 years old. Performed at [facility], [place] [address] [place], [place] [address]      Radiology Studies: DG Chest 2 View  Result Date: 03/19/2022 CLINICAL DATA:  Tachycardia, pneumonia, CHF EXAM: CHEST - 2 VIEW COMPARISON:  Chest radiograph 1 day prior FINDINGS: The cardiomediastinal silhouette is stable with unchanged cardiomegaly. There is worsening left basilar opacity with a small left pleural effusion. There is a suspected trace right pleural effusion. There is vascular congestion and probable mild pulmonary interstitial edema. There is no pneumothorax. Remote rib fractures are again noted, unchanged. Right shoulder arthroplasty hardware is again noted. IMPRESSION: 1. Small left and trace right pleural effusions with worsened opacity in the left base which may reflect atelectasis or worsening infection. 2. Cardiomegaly with vascular congestion and probable mild pulmonary interstitial edema. Electronically Signed   By: Valetta Mole M.D.   On: 03/19/2022 13:35   DG Ankle 2 Views Right  Result Date:  03/18/2022 CLINICAL DATA:  Pain.  Post splinting. EXAM: RIGHT ANKLE - 2 VIEW COMPARISON:  03/18/2022 FINDINGS: Images obtained through cast material which may obscure bone detail. There is a minimally oblique fracture of the distal right fibular shaft with minimal medial displacement. Alignment is unchanged since prior study. Mildly displaced medial malleolar fractures also without significant change. Degenerative changes in the intertarsal joints. IMPRESSION: Fractures of the distal right fibula and medial malleolus without significant change in alignment post casting. Electronically Signed   By: Lucienne Capers M.D.   On: 03/18/2022 20:39   VAS [place] LOWER  EXTREMITY VENOUS (DVT) (ONLY [facility] & [facility])  Result Date: 03/18/2022  Lower Venous DVT Study Patient Name:  Tara Bradley  Date of Exam:   03/18/2022 Medical Rec #: [id number]         Accession #:    [id number] Date of Birth: 1922-05-30          Patient Gender: F Patient Age:   89 years Exam Location:  [facility] Procedure:      VAS [place] LOWER EXTREMITY VENOUS (DVT) Referring Phys: Thelma Comp NANAVATI --------------------------------------------------------------------------------  Indications: Pain, Swelling, and Erythema. Patient got foot caught in mobile scooter yesterday.  Limitations: Edema and pain with touch. Comparison Study: No prior study on file Performing Technologist: Sharion Dove RVS  Examination Guidelines: A complete evaluation includes B-mode imaging, spectral Doppler, color Doppler, and power Doppler as needed of all accessible portions of each vessel. Bilateral testing is considered an integral part of a complete examination. Limited examinations for reoccurring indications may be performed as noted. The reflux portion of the exam is performed with the patient in reverse Trendelenburg.  +---------+---------------+---------+-----------+----------+-------------------+ RIGHT    CompressibilityPhasicitySpontaneityPropertiesThrombus Aging       +---------+---------------+---------+-----------+----------+-------------------+ CFV      Full           Yes      Yes                                      +---------+---------------+---------+-----------+----------+-------------------+ SFJ      Full                                                             +---------+---------------+---------+-----------+----------+-------------------+ FV Prox  Full                                                             +---------+---------------+---------+-----------+----------+-------------------+ FV Mid   Full                                                             +---------+---------------+---------+-----------+----------+-------------------+ FV DistalFull                                                             +---------+---------------+---------+-----------+----------+-------------------+ PFV      Full                                                             +---------+---------------+---------+-----------+----------+-------------------+ POP      Full  Yes      Yes                                      +---------+---------------+---------+-----------+----------+-------------------+ PTV      Full                                         patent by color and                                                       Doppler             +---------+---------------+---------+-----------+----------+-------------------+ PERO     Full                                                             +---------+---------------+---------+-----------+----------+-------------------+   +----+---------------+---------+-----------+----------+--------------+ LEFTCompressibilityPhasicitySpontaneityPropertiesThrombus Aging +----+---------------+---------+-----------+----------+--------------+ CFV Full           Yes      Yes                                  +----+---------------+---------+-----------+----------+--------------+    Summary: RIGHT: - There is no evidence of deep vein thrombosis in the lower extremity.  - No cystic structure found in the popliteal fossa. - Ultrasound characteristics of enlarged lymph nodes are noted in the groin.  LEFT: - No evidence of common femoral vein obstruction.  *See table(s) above for measurements and observations. Electronically signed by Orlie Pollen on 03/18/2022 at 2:39:54 PM.    Final     Scheduled Meds:  diltiazem  360 mg Oral Daily   ferrous gluconate  324 mg Oral BID WC   furosemide  80 mg Oral BID   insulin aspart  0-9 Units Subcutaneous TID WC   insulin glargine-yfgn  8 Units Subcutaneous Daily   metoprolol succinate  100 mg Oral q morning   potassium chloride SA  20 mEq Oral Daily   Rivaroxaban  15 mg Oral Q supper   senna  1 tablet Oral BID   sodium chloride flush  3 mL Intravenous Q12H   tamsulosin  0.4 mg Oral Daily   Continuous Infusions:  sodium chloride       LOS: 2 days   Darliss Cheney, MD [facility]  03/20/2022, 9:37 AM   *Please note that this is a verbal dictation therefore any spelling or grammatical errors are due to the "Gateway One" system interpretation.  Please page via [place] and do not message via secure chat for urgent patient care matters. Secure chat can be used for non urgent patient care matters.  How to contact the [facility] Attending or Consulting provider [place] or covering provider during after hours [place], for this patient?  Check the care team in [facility] and look for a) attending/consulting TR[facility] provider listed and b) the [facility] team listed. Page or secure chat [phone]. Log into [email or web address]  and use [place]'s universal password to access. If you do not have the password, please contact the hospital operator. Locate the [facility] provider you are looking for under [facility] and page to a number that you can be directly reached. If you still have  difficulty reaching the provider, please page the [facility] (Director on Call) for the Hospitalists listed on amion for assistance.
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
## 2022-03-20 NOTE — Progress Notes (Signed)
OT Cancellation Note  Patient Details Name: Tara Bradley MRN: [id number] DOB: 1922-06-04   Cancelled Treatment:    Reason Eval/Treat Not Completed: Patient declined, she is in significant pain to her R leg.  OT will continue efforts as appropriate.     Dillyn Menna D Ozzie Remmers 03/20/2022, 11:14 AM 03/20/2022  RP, OTR/L  Acute Rehabilitation Services  Office:  [phone]

## 2022-03-21 DIAGNOSIS — S82841A Displaced bimalleolar fracture of right lower leg, initial encounter for closed fracture: Secondary | ICD-10-CM | POA: Diagnosis not present

## 2022-03-21 LAB — GLUCOSE, CAPILLARY
Glucose-Capillary: 174 mg/dL — ABNORMAL HIGH (ref 70–99)
Glucose-Capillary: 234 mg/dL — ABNORMAL HIGH (ref 70–99)
Glucose-Capillary: 309 mg/dL — ABNORMAL HIGH (ref 70–99)
Glucose-Capillary: 169 mg/dL — ABNORMAL HIGH (ref 70–99)

## 2022-03-21 MED ORDER — INSULIN GLARGINE-YFGN 100 UNIT/ML ~~LOC~~ SOLN
12.0000 [IU] | Freq: Every day | SUBCUTANEOUS | Status: DC
  Administered 2022-03-21 – 2022-03-22 (×2): 12 [IU] via SUBCUTANEOUS
  Filled 2022-03-21 (×2): qty 0.12

## 2022-03-21 MED ORDER — MELATONIN 3 MG PO TABS
3.0000 mg | ORAL_TABLET | Freq: Every evening | ORAL | Status: DC | PRN
  Administered 2022-03-21 (×2): 3 mg via ORAL
  Filled 2022-03-21 (×2): qty 1

## 2022-03-21 NOTE — Plan of Care (Signed)
  Problem: Education: Goal: Ability to describe self-care measures that may prevent or decrease complications (Diabetes Survival Skills Education) will improve Outcome: Progressing Goal: Individualized Educational Video(s) Outcome: Progressing   Problem: Coping: Goal: Ability to adjust to condition or change in health will improve Outcome: Progressing   Problem: Fluid Volume: Goal: Ability to maintain a balanced intake and output will improve Outcome: Progressing   Problem: Health Behavior/Discharge Planning: Goal: Ability to identify and utilize available resources and services will improve Outcome: Progressing Goal: Ability to manage health-related needs will improve Outcome: Progressing   Problem: Metabolic: Goal: Ability to maintain appropriate glucose levels will improve Outcome: Progressing   Problem: Nutritional: Goal: Maintenance of adequate nutrition will improve Outcome: Progressing Goal: Progress toward achieving an optimal weight will improve Outcome: Progressing   Problem: Skin Integrity: Goal: Risk for impaired skin integrity will decrease Outcome: Progressing   Problem: Tissue Perfusion: Goal: Adequacy of tissue perfusion will improve Outcome: Progressing   Problem: Education: Goal: Knowledge of disease or condition will improve Outcome: Progressing Goal: Understanding of medication regimen will improve Outcome: Progressing Goal: Individualized Educational Video(s) Outcome: Progressing   Problem: Activity: Goal: Ability to tolerate increased activity will improve Outcome: Progressing   Problem: Cardiac: Goal: Ability to achieve and maintain adequate cardiopulmonary perfusion will improve Outcome: Progressing   Problem: Health Behavior/Discharge Planning: Goal: Ability to safely manage health-related needs after discharge will improve Outcome: Progressing   Problem: Education: Goal: Knowledge of General Education information will  improve Description: Including pain rating scale, medication(s)/side effects and non-pharmacologic comfort measures Outcome: Progressing   Problem: Health Behavior/Discharge Planning: Goal: Ability to manage health-related needs will improve Outcome: Progressing   Problem: Clinical Measurements: Goal: Ability to maintain clinical measurements within normal limits will improve Outcome: Progressing Goal: Will remain free from infection Outcome: Progressing Goal: Diagnostic test results will improve Outcome: Progressing Goal: Respiratory complications will improve Outcome: Progressing Goal: Cardiovascular complication will be avoided Outcome: Progressing   Problem: Activity: Goal: Risk for activity intolerance will decrease Outcome: Progressing   Problem: Nutrition: Goal: Adequate nutrition will be maintained Outcome: Progressing   Problem: Coping: Goal: Level of anxiety will decrease Outcome: Progressing   Problem: Elimination: Goal: Will not experience complications related to bowel motility Outcome: Progressing Goal: Will not experience complications related to urinary retention Outcome: Progressing   Problem: Pain Managment: Goal: General experience of comfort will improve Outcome: Progressing   Problem: Safety: Goal: Ability to remain free from injury will improve Outcome: Progressing   Problem: Skin Integrity: Goal: Risk for impaired skin integrity will decrease Outcome: Progressing

## 2022-03-21 NOTE — Progress Notes (Signed)
Orthopedic Surgery Progress Note   Assessment: Patient is a 88 y.o. female with right ankle fracture, treating non-operatively   Plan: -No acute operative intervention -Swelling seen near splint panel has improved, still ecchymosis in that area. Will continue to monitor -Ice and elevate the right leg greater than the level of the heart -Okay for diet and dvt ppx from ortho perspective -Weight bearing status: NWB RLE in splint -PT/OT evaluate and treat -Pain control -Will monitor her fracture with next XR on 11/02 or prior to discharge (whichever is sooner) -Anticipate discharge to SNF  ___________________________________________________________________________  Subjective: No acute events overnight. Pain in the shin has improved since the splint was adjusted and padded yesterday. Having some ankle pain but it is tolerable. Was able to get some sleep last night. Denies paresthesias and numbness.    Physical Exam:  Afebrile, normotensive  General: no acute distress, appears stated age, sleeping but awakes to voice  Neurologic: alert, answering questions appropriately, following commands Respiratory: unlabored breathing, symmetric chest rise Psychiatric: appropriate affect, normal cadence to speech  MSK:   -Right lower extremity  Ecchymosis similar to yesterday, swelling has improved  Splint in place Fires hip flexors, quadriceps, hamstrings, tibialis anterior, gastrocnemius and soleus, extensor hallucis longus Plantarflexes and dorsiflexes toes Sensation intact to light touch in sural, saphenous, tibial, deep peroneal, and superficial peroneal nerve distributions Foot warm and well perfused  WBC wnl  Patient name: Tara Bradley Patient MRN: [id number] Date: 03/21/22

## 2022-03-21 NOTE — Progress Notes (Signed)
Occupational Therapy Treatment Patient Details Name: Tara Bradley MRN: [id number] DOB: 1923-02-14 Today's Date: 03/21/2022   History of present illness Pt is 88 y/o F admitted to [facility] on [facility]/18/[facility] after a fall resulting in R ankle bimalleolar fx NWB, opted out of surgery. PMH of Afib with RVR, DM, CHF, HLD, HTN,. thoracic aortic dissesction, L femur IMN, R TSA   OT comments  Patient received in supine and agreeable to OT session. Patient was min assist and verbal cues to get to EOB.  Patient instructed on lateral scoot transfer and performed bed to drop arm recliner transfer with mod assist. Patient positioned for comfort and performed grooming seated up in recliner. Patient is making good gains and could benefit from further OT services in SNF to increase independence with functional transfers and self care.    Recommendations for follow up therapy are one component of a multi-disciplinary discharge planning process, led by the attending physician.  Recommendations may be updated based on patient status, additional functional criteria and insurance authorization.    Follow Up Recommendations  Skilled nursing-short term rehab (<3 hours/day)    Assistance Recommended at Discharge Frequent or constant Supervision/Assistance  Patient can return home with the following  Assistance with cooking/housework;Assist for transportation;Help with stairs or ramp for entrance;A lot of help with bathing/dressing/bathroom;A lot of help with walking and/or transfers   Equipment Recommendations  Wheelchair cushion (measurements OT);Wheelchair (measurements OT)    Recommendations for Other Services      Precautions / Restrictions Precautions Precautions: Fall Precaution Comments: watch HR Restrictions Weight Bearing Restrictions: Yes RLE Weight Bearing: Non weight bearing       Mobility Bed Mobility Overal bed mobility: Needs Assistance Bed Mobility: Supine to Sit     Supine to sit: HOB  elevated, Min assist     General bed mobility comments: increased time and min assist to perform wtih assistance to scoot hips forwared    Transfers Overall transfer level: Needs assistance Equipment used: None Transfers: Bed to chair/wheelchair/BSC            Lateral/Scoot Transfers: Mod assist General transfer comment: patient instructed on lateral scoot transfer to drop arm recliner with mod assist     Balance Overall balance assessment: Needs assistance Sitting-balance support: Feet supported, Bilateral upper extremity supported Sitting balance-Leahy Scale: Good Sitting balance - Comments: able to keep balance sitting on EOB and during lateral scoot transfers                                   ADL either performed or assessed with clinical judgement   ADL Overall ADL's : Needs assistance/impaired     Grooming: Wash/dry hands;Wash/dry face;Oral care;Set up;Sitting Grooming Details (indicate cue type and reason): in recliner                                    Extremity/Trunk Assessment              Vision       Perception     Praxis      Cognition Arousal/Alertness: Awake/alert Behavior During Therapy: WFL for tasks assessed/performed Overall Cognitive Status: Within Functional Limits for tasks assessed Area of Impairment: Following commands, Problem solving                       Following  Commands: Follows one step commands consistently, Follows multi-step commands consistently, Follows multi-step commands with increased time     Problem Solving: Slow processing, Requires verbal cues, Requires tactile cues General Comments: alert and oriented. Able torecall WB precautions and follow directions        Exercises      Shoulder Instructions       General Comments HR 124 during transfer    Pertinent Vitals/ Pain       Pain Assessment Pain Assessment: Faces Faces Pain Scale: Hurts little more Pain  Location: R ankle during movement Pain Descriptors / Indicators: Grimacing, Guarding Pain Intervention(s): Limited activity within patient's tolerance, Monitored during session, RN gave pain meds during session, Repositioned  Home Living                                          Prior Functioning/Environment              Frequency  Min 2X/week        Progress Toward Goals  OT Goals(current goals can now be found in the care plan section)  Progress towards OT goals: Progressing toward goals  Acute Rehab OT Goals Patient Stated Goal: get better OT Goal Formulation: With patient Time For Goal Achievement: 04/02/22 Potential to Achieve Goals: Good ADL Goals Pt Will Perform Lower Body Dressing: with supervision;with adaptive equipment;sitting/lateral leans Pt Will Transfer to Toilet: with min assist;stand pivot transfer;bedside commode Additional ADL Goal #1: Setup for lateral scoot to wheelchair for Mod I at w/c level ADL  Plan Discharge plan remains appropriate    Co-evaluation                 AM-PAC OT "6 Clicks" Daily Activity     Outcome Measure   Help from another person eating meals?: None Help from another person taking care of personal grooming?: None Help from another person toileting, which includes using toliet, bedpan, or urinal?: A Lot Help from another person bathing (including washing, rinsing, drying)?: A Lot Help from another person to put on and taking off regular upper body clothing?: A Little Help from another person to put on and taking off regular lower body clothing?: A Lot 6 Click Score: 17    End of Session Equipment Utilized During Treatment: Gait belt;Oxygen  OT Visit Diagnosis: Unsteadiness on feet (R26.81);Muscle weakness (generalized) (M62.81);Pain Pain - Right/Left: Right Pain - part of body: Leg   Activity Tolerance Patient tolerated treatment well   Patient Left in chair;with call bell/phone within  reach;with chair alarm set   Nurse Communication Mobility status;Weight bearing status        Time: 7416-3845 OT Time Calculation (min): 22 min  Charges: OT General Charges $OT Visit: 1 Visit OT Treatments $Self Care/Home Management : 8-22 mins  Lodema Hong, [place]  Office [place] 03/21/2022, 11:54 AM

## 2022-03-21 NOTE — Progress Notes (Signed)
PROGRESS NOTE    Tara Bradley  UTM:[id number] DOB: 05-17-1923 DOA: 03/18/2022 PCP: [name], MD   Brief Narrative:  HPI: Tara Bradley is a 88 y.o. female with medical history significant of chronic atrial fibrillation on xarelto, T2DM, HTN, HLD, diastolic CHF, insomnia, hx of breast cancer, chronic respiratory failure on 4L oxygen PRN, aortic aneurysm with chronic descending dissection who presented to ED after a fall. She was using an electric wheelchair and was trying to go between the door and hit the side of the door frame. Her foot was there and got stuck and the WC kept going and twisted her ankle. She didn't fall out of the chair. Her foot was just stuck until she was able to reverse the chair. She had 10/10 pain in her right ankle. She is on xarelto and last took in on 10/24 in the pm.    She was just getting over cellulitis of her lower legs. She was on oral antibiotics for this.    She has been feeling good. Denies any fever/chills, vision changes/headaches, chest pain or palpitations, shortness of breath or cough, abdominal pain, N/V/D, dysuria or leg swelling.      She does not smoke or drink alcohol.    ER Course:  afebrile, bp: 115/85, HR: 139, RR: 23 oxygen 98% [place] Pertinent labs: hgb: 10.3,  CXR: ill defined opacity within left lung base. Bilateral pleural effusion. Trace right/small left. Cardiomegaly.  Tib/fib: minimally displaced oblique fx of the distal fibular diaphysis and non displaced intra-articular fracture of the medial malleolus.  In ED: ortho consulted. Given '30mg'$  of cardizem and started on cardizem gtt. TRH asked to admit.     Assessment & Plan:   Principal Problem:   Bimalleolar fracture of right ankle Active Problems:   Chronic atrial fibrillation with RVR (HCC)   Uncontrolled type 2 diabetes mellitus with hyperglycemia, with long-term current use of insulin (HCC)   Chronic diastolic CHF (congestive heart failure) (HCC)   Essential  hypertension   Chronic hypoxemic respiratory failure (HCC)   Hyperlipidemia   Recurrent cellulitis of lower extremity  Bimalleolar fracture of right ankle: Seen by orthopedics.  Patient refused surgical intervention.  Splint was placed by orthopedics.  Nonweightbearing in right lower extremity in a splint.  Patient was complaining of pain and tight splint, splint was readjusted by orthopedics on 03/12/2022.  She feels better after readjusting and loosening of the splint.  Orthopedics plans to get another x-ray before discharge.  PT OT recommends SNF.  TOC working on that.   Chronic atrial fibrillation with RVR (HCC) CHA2DS2-Vasc score of 5.  She was started on Cardizem drip upon admission but was transitioned back to oral home dose of Cardizem and beta-blocker.  A-fib controlled with intermittent bursts of up to 130 but per patient, this is her baseline she remains asymptomatic.  Continue Xarelto.  Uncontrolled type 2 diabetes mellitus with hyperglycemia, with long-term current use of insulin (HCC) A1C 6.9 per recent cardiology note.  Hold metformin, continue Lantus 8 units and SSI.   Chronic diastolic CHF (congestive heart failure) (HCC)/chronic hypoxic respiratory failure: Patient appears euvolemic, chest x-ray with chronic findings and cardiomegaly.  No crackles on the lung exam.  Slightly elevated BNP.  Patient on Lasix 80 mg p.o. twice daily at home which we have resumed.  Continue metoprolol as well.  She tells me that she was using 2 L of oxygen which she has been off for the last 3 weeks but has been  using intermittently.  She is currently on 2 L of oxygen.   Essential hypertension Well controlled.  Continue beta-blocker and Cardizem.  Recurrent cellulitis of lower extremity Has been on 2 rounds of RLE cellulitis. Completed both courses.  She just had leg splinted and wrapped for fracture so can not examine. She tells me it has improved.  ? Venous stasis changes from swelling vs. True  cellulitis She has no leukocytosis or other systemic signs Will need exam when can take off splint  -right LE doppler, negative for DVT   DVT prophylaxis:   Xarelto   Code Status: DNR  Family Communication:  None present at bedside.  Plan of care discussed with patient in length and he/she verbalized understanding and agreed with it.  Status is: Inpatient Remains inpatient appropriate because: Medically stable.  Pending SNF placement.   Estimated body mass index is 24.39 kg/m as calculated from the following:   Height as of this encounter: 5' 5.5" (1.664 m).   Weight as of this encounter: 67.5 kg.    Nutritional Assessment: Body mass index is 24.39 kg/m.[name] Seen by dietician.  I agree with the assessment and plan as outlined below: Nutrition Status:        . Skin Assessment: I have examined the patient's skin and I agree with the wound assessment as performed by the wound care RN as outlined below:    Consultants:  Orthopedics  Procedures:  None  Antimicrobials:  Anti-infectives (From admission, onward)    None         Subjective:  Seen and examined.  She has no complaints.  Pain is controlled.  Objective: Vitals:   03/20/22 2310 03/21/22 0423 03/21/22 0700 03/21/22 0932  BP: 112/79 107/83    Pulse: (!) 102 (!) 103  (!) 129  Resp: 18 13    Temp: 97.8 F (36.6 C) 98.1 F (36.7 C) 98.3 F (36.8 C)   TempSrc: Oral Oral Oral   SpO2:  94%    Weight:      Height:       No intake or output data in the 24 hours ending 03/21/22 1017  Filed Weights   03/19/22 0000  Weight: 67.5 kg    Examination:  General exam: Appears calm and comfortable  Respiratory system: Clear to auscultation. Respiratory effort normal. Cardiovascular system: S1 & S2 heard, irregularly irregular rate and rhythm. No JVD, murmurs, rubs, gallops or clicks. No pedal edema. Gastrointestinal system: Abdomen is nondistended, soft and nontender. No organomegaly or masses felt. Normal  bowel sounds heard. Central nervous system: Alert and oriented. No focal neurological deficits. Extremities: Splint in the right lower extremity. Psychiatry: Judgement and insight appear normal. Mood & affect appropriate.   Data Reviewed: I have personally reviewed following labs and imaging studies  CBC: Recent Labs  Lab 03/18/22 0841 03/19/22 0024 03/20/22 0020  WBC 7.0 6.5 6.1  NEUTROABS  --   --  4.8  HGB 10.3* 10.6* 9.8*  HCT 32.5* 32.0* 30.5*  MCV 95.6 93.3 94.1  PLT 186 190 676    Basic Metabolic Panel: Recent Labs  Lab 03/18/22 0841 03/19/22 0024 03/20/22 0020  NA 137 138 138  K 4.3 3.8 4.6  CL 103 96* 99  CO2 24 29 32  GLUCOSE 255* 135* 156*  BUN 23 20 27*  CREATININE 0.89 0.91 1.08*  CALCIUM 8.5* 9.0 8.7*  MG 1.7  --   --     GFR: Estimated Creatinine Clearance: 27.4 mL/min (A) (by  C-G formula based on SCr of 1.08 mg/dL (H)). Liver Function Tests: No results for input(s): "AST", "ALT", "ALKPHOS", "BILITOT", "PROT", "ALBUMIN" in the last 168 hours. No results for input(s): "LIPASE", "AMYLASE" in the last 168 hours. No results for input(s): "AMMONIA" in the last 168 hours. Coagulation Profile: No results for input(s): "INR", "PROTIME" in the last 168 hours. Cardiac Enzymes: No results for input(s): "CKTOTAL", "CKMB", "CKMBINDEX", "TROPONINI" in the last 168 hours. BNP (last 3 results) No results for input(s): "PROBNP" in the last 8760 hours. HbA1C: No results for input(s): "HGBA1C" in the last 72 hours. CBG: Recent Labs  Lab 03/20/22 0702 03/20/22 1140 03/20/22 1610 03/20/22 2153 03/21/22 0700  GLUCAP 113* 149* 155* 198* 174*    Lipid Profile: No results for input(s): "CHOL", "HDL", "LDLCALC", "TRIG", "CHOLHDL", "LDLDIRECT" in the last 72 hours. Thyroid Function Tests: Recent Labs    03/19/22 0024  TSH 4.251    Anemia Panel: No results for input(s): "VITAMINB12", "FOLATE", "FERRITIN", "TIBC", "IRON", "RETICCTPCT" in the last 72  hours. Sepsis Labs: No results for input(s): "PROCALCITON", "LATICACIDVEN" in the last 168 hours.  Recent Results (from the past 240 hour(s))  MRSA Next Gen by PCR, Nasal     Status: None   Collection Time: 03/19/22 12:01 AM   Specimen: Nasal Mucosa; Nasal Swab  Result Value Ref Range Status   MRSA by PCR Next Gen NOT DETECTED NOT DETECTED Final    Comment: (NOTE) The GeneXpert MRSA Assay (FDA approved for NASAL specimens only), is one component of a comprehensive MRSA colonization surveillance program. It is not intended to diagnose MRSA infection nor to guide or monitor treatment for MRSA infections. Test performance is not FDA approved in patients less than 9 years old. Performed at [facility], [place] [address], [place], [place] [address]      Radiology Studies: DG Chest 2 View  Result Date: 03/19/2022 CLINICAL DATA:  Tachycardia, pneumonia, CHF EXAM: CHEST - 2 VIEW COMPARISON:  Chest radiograph 1 day prior FINDINGS: The cardiomediastinal silhouette is stable with unchanged cardiomegaly. There is worsening left basilar opacity with a small left pleural effusion. There is a suspected trace right pleural effusion. There is vascular congestion and probable mild pulmonary interstitial edema. There is no pneumothorax. Remote rib fractures are again noted, unchanged. Right shoulder arthroplasty hardware is again noted. IMPRESSION: 1. Small left and trace right pleural effusions with worsened opacity in the left base which may reflect atelectasis or worsening infection. 2. Cardiomegaly with vascular congestion and probable mild pulmonary interstitial edema. Electronically Signed   By: Valetta Mole M.D.   On: 03/19/2022 13:35    Scheduled Meds:  diltiazem  360 mg Oral Daily   ferrous gluconate  324 mg Oral BID WC   furosemide  80 mg Oral BID   insulin aspart  0-9 Units Subcutaneous TID WC   insulin glargine-yfgn  12 Units Subcutaneous Daily   metoprolol succinate  100 mg Oral q  morning   potassium chloride SA  20 mEq Oral Daily   Rivaroxaban  15 mg Oral Q supper   senna  1 tablet Oral BID   sodium chloride flush  3 mL Intravenous Q12H   tamsulosin  0.4 mg Oral Daily   Continuous Infusions:  sodium chloride       LOS: 3 days   Darliss Cheney, MD [facility]  03/21/2022, 10:17 AM   *Please note that this is a verbal dictation therefore any spelling or grammatical errors are due to  the "[place] [employer]" system interpretation.  Please page via [place] and do not message via secure chat for urgent patient care matters. Secure chat can be used for non urgent patient care matters.  How to contact the [facility] Attending or Consulting provider [place] or covering provider during after hours [place], for this patient?  Check the care team in [facility] and look for a) attending/consulting [facility] provider listed and b) the [facility] team listed. Page or secure chat [phone]. Log into [email or web address] and use [place]'s universal password to access. If you do not have the password, please contact the hospital operator. Locate the [facility] provider you are looking for under [facility] and page to a number that you can be directly reached. If you still have difficulty reaching the provider, please page the [facility] (Director on Call) for the Hospitalists listed on amion for assistance.

## 2022-03-22 ENCOUNTER — Inpatient Hospital Stay (HOSPITAL_COMMUNITY): Payer: Medicare Other

## 2022-03-22 DIAGNOSIS — S82841A Displaced bimalleolar fracture of right lower leg, initial encounter for closed fracture: Secondary | ICD-10-CM | POA: Diagnosis not present

## 2022-03-22 LAB — BASIC METABOLIC PANEL
Anion gap: 8 (ref 5–15)
BUN: 37 mg/dL — ABNORMAL HIGH (ref 8–23)
CO2: 29 mmol/L (ref 22–32)
Calcium: 8.7 mg/dL — ABNORMAL LOW (ref 8.9–10.3)
Chloride: 99 mmol/L (ref 98–111)
Creatinine, Ser: 1.13 mg/dL — ABNORMAL HIGH (ref 0.44–1.00)
GFR, Estimated: 44 mL/min — ABNORMAL LOW (ref >60)
Glucose, Bld: 234 mg/dL — ABNORMAL HIGH (ref 70–99)
Potassium: 4.3 mmol/L (ref 3.5–5.1)
Sodium: 136 mmol/L (ref 135–145)

## 2022-03-22 LAB — CBC WITH DIFFERENTIAL/PLATELET
Abs Immature Granulocytes: 0.04 10*3/uL (ref 0.00–0.07)
Basophils Absolute: 0 10*3/uL (ref 0.0–0.1)
Basophils Relative: 1 %
Eosinophils Absolute: 0.1 10*3/uL (ref 0.0–0.5)
Eosinophils Relative: 2 %
HCT: 32.7 % — ABNORMAL LOW (ref 36.0–46.0)
Hemoglobin: 10.4 g/dL — ABNORMAL LOW (ref 12.0–15.0)
Immature Granulocytes: 1 %
Lymphocytes Relative: 7 %
Lymphs Abs: 0.6 10*3/uL — ABNORMAL LOW (ref 0.7–4.0)
MCH: 29.7 pg (ref 26.0–34.0)
MCHC: 31.8 g/dL (ref 30.0–36.0)
MCV: 93.4 fL (ref 80.0–100.0)
Monocytes Absolute: 0.7 10*3/uL (ref 0.1–1.0)
Monocytes Relative: 9 %
Neutro Abs: 6.2 10*3/uL (ref 1.7–7.7)
Neutrophils Relative %: 80 %
Platelets: 223 10*3/uL (ref 150–400)
RBC: 3.5 MIL/uL — ABNORMAL LOW (ref 3.87–5.11)
RDW: 16.3 % — ABNORMAL HIGH (ref 11.5–15.5)
WBC: 7.7 10*3/uL (ref 4.0–10.5)
nRBC: 0 % (ref 0.0–0.2)

## 2022-03-22 LAB — GLUCOSE, CAPILLARY
Glucose-Capillary: 143 mg/dL — ABNORMAL HIGH (ref 70–99)
Glucose-Capillary: 192 mg/dL — ABNORMAL HIGH (ref 70–99)
Glucose-Capillary: 223 mg/dL — ABNORMAL HIGH (ref 70–99)

## 2022-03-22 MED ORDER — CYCLOBENZAPRINE HCL 10 MG PO TABS
5.0000 mg | ORAL_TABLET | Freq: Three times a day (TID) | ORAL | Status: DC | PRN
  Administered 2022-03-22: 5 mg via ORAL
  Filled 2022-03-22: qty 1

## 2022-03-22 MED ORDER — DILTIAZEM HCL ER COATED BEADS 240 MG PO CP24: 480.0000 mg | ORAL_CAPSULE | Freq: Every day | ORAL | 0 refills | Status: AC

## 2022-03-22 MED ORDER — DILTIAZEM HCL ER COATED BEADS 240 MG PO CP24
480.0000 mg | ORAL_CAPSULE | Freq: Every day | ORAL | Status: DC
  Administered 2022-03-22: 480 mg via ORAL
  Filled 2022-03-22: qty 2

## 2022-03-22 MED ORDER — OXYCODONE HCL 5 MG PO TABS: 2.5000 mg | ORAL_TABLET | ORAL | 0 refills | Status: DC | PRN

## 2022-03-22 NOTE — Progress Notes (Signed)
Report given to [facility] to nurse Lorriane Shire  walker. Awaiting [facility] for transport.

## 2022-03-22 NOTE — Progress Notes (Signed)
Physical Therapy Treatment Patient Details Name: Tara Bradley MRN: [id number] DOB: [birth year] Today's Date: 03/22/2022   History of Present Illness Pt is 88 y/o F admitted to [facility] on 03/18/22 after a fall resulting in R ankle bimalleolar fx NWB, opted out of surgery. PMH of Afib with RVR, DM, CHF, HLD, HTN,. thoracic aortic dissesction, L femur IMN, R TSA    PT Comments    Pt slowly progressing with mobility. Today's session focused on bed mobility and transfers while maintaining RLE NWB precautions. Pt able to demonstrate sit to stand with minA, requiring up to mod-maxA for hop pivot transfer due to unfamiliar movement and difficulty maintaining NWB precautions. Prolonged rest breaks necessary due to HR ranging from 117-173 bpm, sustained 130-140s during majority of mobility. Pt remains limited by generalized weakness, decreased activity tolerance, and impaired balance strategies/postural reactions. Continue to recommend acute PT services to maximize functional mobility and independence prior to d/c to SNF level therapies.    Recommendations for follow up therapy are one component of a multi-disciplinary discharge planning process, led by the attending physician.  Recommendations may be updated based on patient status, additional functional criteria and insurance authorization.  Follow Up Recommendations  Skilled nursing-short term rehab (<3 hours/day) Can patient physically be transported by private vehicle: No   Assistance Recommended at Discharge    Patient can return home with the following A lot of help with walking and/or transfers;A lot of help with bathing/dressing/bathroom;Assistance with cooking/housework;Assist for transportation;Help with stairs or ramp for entrance   Equipment Recommendations  Wheelchair (measurements PT)    Recommendations for Other Services       Precautions / Restrictions Precautions Precautions: Fall Precaution Comments: watch  HR Restrictions Weight Bearing Restrictions: Yes RLE Weight Bearing: Non weight bearing     Mobility  Bed Mobility Overal bed mobility: Needs Assistance Bed Mobility: Supine to Sit     Supine to sit: HOB elevated, Mod assist     General bed mobility comments: ModA for truncal control during ascent and guidance of R LE as approaching EOB    Transfers Overall transfer level: Needs assistance Equipment used: Rolling walker (2 wheels) Transfers: Bed to chair/wheelchair/BSC, Sit to/from Stand Sit to Stand: From elevated surface, Min assist   Step pivot transfers: Mod assist, Max assist, +2 safety/equipment       General transfer comment: Verbal cues necessary for appropriate hand placement with sit to stand transfer. Trialed hop pivot transfer with use of L LE and RW, pt unable to fully lift leg and did foot scooting. Pt began to fatigue after 3-4 hop/foot scoot attempts, requiring maxA to guide hips appropriate to chair from second person to prevent LOB    Ambulation/Gait                   Stairs             Wheelchair Mobility    Modified Rankin (Stroke Patients Only)       Balance Overall balance assessment: Needs assistance Sitting-balance support: Feet supported, Bilateral upper extremity supported Sitting balance-Leahy Scale: Good Sitting balance - Comments: Pt able to sit supervision EOB for prolonged time period with verbal cues for energy conservation due to spikes in HR up to 173 bpm. Demonstrated safe dynamic sitting balance while repositioning hips on recliner   Standing balance support: Reliant on assistive device for balance, Bilateral upper extremity supported Standing balance-Leahy Scale: Poor Standing balance comment: Heavy reliance on UE for upright support due to  NWB on RLE                            Cognition Arousal/Alertness: Awake/alert Behavior During Therapy: WFL for tasks assessed/performed Overall Cognitive  Status: No family/caregiver present to determine baseline cognitive functioning Area of Impairment: Problem solving, Following commands                       Following Commands: Follows one step commands consistently, Follows multi-step commands consistently, Follows multi-step commands with increased time     Problem Solving: Slow processing, Requires verbal cues, Requires tactile cues General Comments: Pt able to verbalize WB precautions and demonstrate dual tasking during bed mobility and transfer        Exercises General Exercises - Lower Extremity Ankle Circles/Pumps: AROM, Left, 10 reps Straight Leg Raises: AROM, AAROM, Both, 10 reps    General Comments General comments (skin integrity, edema, etc.): HR 110s at rest, up to 168 bpm during transfer (not sustained, mainly in 130s-140s). SpO2 >92% on 2L [place].      Pertinent Vitals/Pain Pain Assessment Pain Assessment: Faces Faces Pain Scale: Hurts whole lot Pain Location: R ankle during movement    Home Living Family/patient expects to be discharged to:: Private residence Living Arrangements: Alone Available Help at Discharge: Friend(s);Available PRN/intermittently Type of Home: Apartment Home Access: Level entry                Prior Function            PT Goals (current goals can now be found in the care plan section) Acute Rehab PT Goals Patient Stated Goal: to be in less pain PT Goal Formulation: With patient Time For Goal Achievement: 04/02/22 Potential to Achieve Goals: Good Additional Goals Additional Goal #1: Pt will self propel w/c mod independent 150 ft for community mobility Progress towards PT goals: Progressing toward goals    Frequency    Min 3X/week      PT Plan Current plan remains appropriate    Co-evaluation              AM-PAC PT "6 Clicks" Mobility   Outcome Measure  Help needed turning from your back to your side while in a flat bed without using bedrails?: A  Little Help needed moving from lying on your back to sitting on the side of a flat bed without using bedrails?: A Lot Help needed moving to and from a bed to a chair (including a wheelchair)?: A Lot Help needed standing up from a chair using your arms (e.g., wheelchair or bedside chair)?: A Little Help needed to walk in hospital room?: Total Help needed climbing 3-5 steps with a railing? : Total 6 Click Score: 12    End of Session Equipment Utilized During Treatment: Gait belt;Oxygen Activity Tolerance: Treatment limited secondary to medical complications (Comment);Patient limited by pain Patient left: with call bell/phone within reach;in chair Nurse Communication: Mobility status PT Visit Diagnosis: Other abnormalities of gait and mobility (R26.89);Repeated falls (R29.6);Muscle weakness (generalized) (M62.81)     Time: 2947-6546 PT Time Calculation (min) (ACUTE ONLY): 29 min  Charges:  $Therapeutic Activity: 23-37 mins                     Chipper Oman, SPT    [place] Nimrod Wendt 03/22/2022, 11:16 AM

## 2022-03-22 NOTE — Discharge Instructions (Signed)
Orthopedic Discharge Instructions  You should remain non-weight bearing on the right lower extremity and keep the splint on at all times. The splint should remain dry at all times. Keep the leg elevated when in bed. Ideally, it is elevated above the level of your heart to help with the swelling. If you develop pain underneath the splint, do not remove it. Call the office to schedule an appointment, so I can take down the splint and look at your leg. We may need to repeat new x-rays in the office too. I want to see you back in the office in 7-10 days after discharge. You should call the office at [phone] to schedule that appointment.   Ileene Rubens, MD Orthopedic Surgeon

## 2022-03-22 NOTE — Inpatient Diabetes Management (Signed)
Inpatient Diabetes Program Recommendations  AACE/ADA: New Consensus Statement on Inpatient Glycemic Control  Target Ranges:  Prepandial:   less than 140 mg/dL      Peak postprandial:   less than 180 mg/dL (1-2 hours)      Critically ill patients:  140 - 180 mg/dL    Latest Reference Range & Units 03/21/22 07:00 03/21/22 11:32 03/21/22 15:41 03/21/22 21:02 03/22/22 06:05  Glucose-Capillary 70 - 99 mg/dL 174 (H) 234 (H) 309 (H) 169 (H) 143 (H)   Review of Glycemic Control  Diabetes history: DM2 Outpatient Diabetes medications: Lantus 8 units daily, Metformin 1000 mg QPM Current orders for Inpatient glycemic control: Semglee 12 units daily, Novolog 0-9 units TID with meals  Inpatient Diabetes Program Recommendations:    Insulin: If post prandial glucose consistently over 180 mg/dl, please consider ordering Novolog 2 units TID with meals for meal coverage if patient eats at least 50% of meals.  Thanks, Barnie Alderman, RN, MSN, [place] [facility] Coordinator Inpatient Diabetes Program [phone] (Team Pager from 8am to [place])

## 2022-03-22 NOTE — Care Management Important Message (Signed)
Important Message  Patient Details  Name: Tara Bradley MRN: [id number] Date of Birth: [birth year]   Medicare Important Message Given:  Yes     Orbie Pyo 03/22/2022, 3:39 PM

## 2022-03-22 NOTE — Progress Notes (Signed)
Picked up by [facility] for transport to [facility]. Belongings  with pt.
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
## 2022-03-22 NOTE — TOC Transition Note (Signed)
Transition of Care [facility]) - CM/SW Discharge Note   Patient Details  Name: Tara Bradley MRN: [id number] Date of Birth: [birth year]  [facility] CM/SW Contact:  Bethann Berkshire, [place] Phone Number: 03/22/2022, 2:11 PM   Clinical Narrative:     Patient will DC to: [facility] Anticipated DC date: 03/22/22 Family notified: Caregiver notified; pt to notify her daughter Transport by: Corey Harold   Per MD patient ready for DC to [facility]. RN, patient, patient's family, and facility notified of DC. Discharge Summary and FL2 sent to facility. RN to call report prior to discharge [phone]). DC packet on chart. Ambulance transport requested for patient.   CSW will sign off for now as social work intervention is no longer needed. Please consult [place] again if new needs arise.   Final next level of care: Skilled Nursing Facility Barriers to Discharge: No Barriers Identified   Patient Goals and CMS Choice Patient states their goals for this hospitalization and ongoing recovery are:: I want to go somewhere that is good CMS Medicare.gov Compare Post Acute Care list provided to:: Patient    Discharge Placement              Patient [facility] bed at: [facility] Patient to be transferred to facility by: [place] Name of family member notified: Caretaker notified; pt to notify her daughter Patient and family notified of of transfer: 03/22/22  Discharge Plan and Services In-house Referral: Clinical Social Work                                   Social Determinants of Health (SDOH) Interventions     Readmission Risk Interventions    05/11/2021    1:11 PM  Readmission Risk Prevention Plan  Transportation Screening Complete  Home Care Screening Complete

## 2022-03-22 NOTE — Progress Notes (Signed)
Orthopedic Surgery Progress Note   Assessment: Patient is a 88 y.o. female with right ankle fracture, treating non-operatively   Plan: -No acute operative intervention -Wanted to convert to splint but still has swelling and ecchymosis. Explained that we will hopefully convert her to a cast at the first office visit, but it's her pain and swelling over the anterior tibia that will determine when we can get her into a cast -Ice and elevate the right leg greater than the level of the heart -Okay for diet and dvt ppx from ortho perspective -Weight bearing status: NWB RLE in splint -PT/OT evaluate and treat -Pain control -Explained to patient that she should see me in approximately 10 days in the office -Anticipate discharge to SNF  ___________________________________________________________________________  Subjective: No acute events overnight. Pain in anterior leg is still much better. Not having much ankle pain. Still in agreement with plan for non-operative management and is still not worried about developing arthritis in the ankle. Denies paresthesias and numbness.    Physical Exam:  Afebrile, normotensive  General: no acute distress, appears stated age, sleeping but awakes to voice  Neurologic: alert, answering questions appropriately, following commands Respiratory: unlabored breathing, symmetric chest rise Psychiatric: appropriate affect, normal cadence to speech  MSK:   -Right lower extremity  Ecchymosis similar to yesterday, swelling has improved  Splint in place Fires hip flexors, quadriceps, hamstrings, tibialis anterior, gastrocnemius and soleus, extensor hallucis longus Plantarflexes and dorsiflexes toes Sensation intact to light touch in sural, saphenous, tibial, deep peroneal, and superficial peroneal nerve distributions Foot warm and well perfused  WBC wnl  XR of right ankle from today was reviewed and showed relatively unchanged alignment and fracture  displacement from prior films  Patient name: Tara Bradley Patient MRN: [id number] Date: 03/22/22

## 2022-03-22 NOTE — Discharge Summary (Addendum)
Physician Discharge Summary  Tara Bradley UEA:[id number] DOB: 1923-04-20 DOA: 03/18/2022  PCP: [name], MD  Admit date: 03/18/2022 Discharge date: 03/22/2022 30 Day Unplanned Readmission Risk Score    Flowsheet Row ED to Hosp-Admission (Current) from 03/18/2022 in [place] [facility]  30 Day Unplanned Readmission Risk Score (%) 29.3 Filed at 03/22/2022 1200       This score is the patient's risk of an unplanned readmission within 30 days of being discharged (0 -100%). The score is based on dignosis, age, lab data, medications, orders, and past utilization.   Low:  0-14.9   Medium: 15-21.9   High: 22-29.9   Extreme: 30 and above          Admitted From: Home Disposition: SNF  Recommendations for Outpatient Follow-up:  Follow up with PCP in 1-2 weeks Please obtain BMP/CBC in one week Follow-up with orthopedics in 7 to 10 days. Please follow up with your PCP on the following pending results: Unresulted Labs (From admission, onward)    None         Home Health: None Equipment/Devices: None  Discharge Condition: Stable CODE STATUS: DNR Diet recommendation: Cardiac  Subjective: Patient seen and examined.  Has pain in the right lower extremity but otherwise no complaints.  Brief/Interim Summary: HPI: Tara Bradley is a 88 y.o. female with medical history significant of chronic atrial fibrillation on xarelto, T2DM, HTN, HLD, diastolic CHF, insomnia, hx of breast cancer, chronic respiratory failure on 4L oxygen PRN, aortic aneurysm with chronic descending dissection who presented to ED after a fall. She was using an electric wheelchair and was trying to go between the door and hit the side of the door frame. Her foot was there and got stuck and the WC kept going and twisted her ankle. She didn't fall out of the chair. Her foot was just stuck until she was able to reverse the chair. She had 10/10 pain in her right ankle. She is on xarelto and last took  in on 10/24 in the pm.  She was just getting over cellulitis of her lower legs. She was on oral antibiotics for this.    ER Course:  afebrile, bp: 115/85, HR: 139, RR: 23 oxygen 98% [place] Pertinent labs: hgb: 10.3,  CXR: ill defined opacity within left lung base. Bilateral pleural effusion. Trace right/small left. Cardiomegaly.  Tib/fib: minimally displaced oblique fx of the distal fibular diaphysis and non displaced intra-articular fracture of the medial malleolus.  In ED: ortho consulted. Given '30mg'$  of cardizem and started on cardizem gtt. [facility] asked to admit.    Bimalleolar fracture of right ankle: Seen by orthopedics.  Patient refused surgical intervention.  Splint was placed by orthopedics.  Nonweightbearing in right lower extremity in a splint.  Patient was complaining of pain and tight splint, splint was readjusted by orthopedics on 03/12/2022.  She feels better after readjusting and loosening of the splint.  Orthopedics has cleared her for the discharge.  They will follow-up with her as outpatient in 7 to 10 days.   Chronic atrial fibrillation with RVR (HCC) CHA2DS2-Vasc score of 5.  She was started on Cardizem drip upon admission but was transitioned back to oral home dose of Cardizem and beta-blocker.  A-fib controlled with intermittent bursts of up to 130 but per patient, this is her baseline she remains asymptomatic.  Continue Xarelto.  I have increased her Cardizem from 360 mg p.o. daily to 40 mg p.o. daily.   Uncontrolled type 2  diabetes mellitus with hyperglycemia, with long-term current use of insulin (HCC) A1C 6.9 per recent cardiology note. Hold metformin, blood sugar elevated, will increase Lantus to 12 units and continue SSI.   Chronic diastolic CHF (congestive heart failure) (HCC)/chronic hypoxic respiratory failure: Patient appears euvolemic, chest x-ray with chronic findings and cardiomegaly.  No crackles on the lung exam.  Slightly elevated BNP.  Patient on Lasix 80 mg p.o. twice  daily at home which we have resumed.  Continue metoprolol as well.  She tells me that she was using 2 L of oxygen which she has been off for the last 3 weeks but has been using intermittently.  She is currently on 2 L of oxygen.   Essential hypertension Well controlled.  Continue beta-blocker and Cardizem.   Recurrent cellulitis of lower extremity Has been on 2 rounds of RLE cellulitis. Completed both courses.  She just had leg splinted and wrapped for fracture so can not examine. She tells me it has improved.  ? Venous stasis changes from swelling vs. True cellulitis She has no leukocytosis or other systemic signs Will need exam when can take off splint  -right LE doppler, negative for DVT   Discharge plan was discussed with patient and/or family member and they verbalized understanding and agreed with it.  Discharge Diagnoses:  Principal Problem:   Bimalleolar fracture of right ankle Active Problems:   Chronic atrial fibrillation with RVR ([place])   Uncontrolled type 2 diabetes mellitus with hyperglycemia, with long-term current use of insulin (HCC)   Chronic diastolic CHF (congestive heart failure) (HCC)   Essential hypertension   Chronic hypoxemic respiratory failure (HCC)   Hyperlipidemia   Recurrent cellulitis of lower extremity    Discharge Instructions   Allergies as of 03/22/2022       Reactions   Ambien [zolpidem] Other (See Comments)   hallucinations   Codeine Other (See Comments)   headache   Codeine Sulfate Other (See Comments)   REACTION: unspecified   Dulaglutide Diarrhea   Latex Hives   Morphine Sulfate Nausea And Vomiting   Mirtazapine Palpitations        Medication List     STOP taking these medications    acetaZOLAMIDE 250 MG tablet Commonly known as: DIAMOX       TAKE these medications    BD Pen Needle Nano U/F 32G X 4 MM Misc Generic drug: Insulin Pen Needle Inject into the skin daily.   Calcium Carbonate-Vitamin D 600-400 MG-UNIT  tablet Take 1 tablet by mouth daily.   diltiazem 240 MG 24 hr capsule Commonly known as: CARDIZEM CD Take 2 capsules (480 mg total) by mouth daily. Start taking on: March 23, 2022 What changed:  medication strength how much to take   ferrous gluconate 324 MG tablet Commonly known as: FERGON Take 324 mg by mouth 2 (two) times daily with a meal.   furosemide 80 MG tablet Commonly known as: LASIX TAKE ONE TABLET TWICE DAILY AT 8am AND 9pm FOR edema/chf What changed:  See the new instructions. Another medication with the same name was removed. Continue taking this medication, and follow the directions you see here.   insulin glargine 100 UNIT/ML Solostar Pen Commonly known as: LANTUS Inject 8 Units into the skin daily.   metFORMIN 500 MG (MOD) 24 hr tablet Commonly known as: GLUMETZA Take 1,000 mg by mouth every evening.   metoprolol succinate 100 MG 24 hr tablet Commonly known as: TOPROL-XL TAKE ONE TABLET BY MOUTH EVERY MORNING What  changed: when to take this   oxyCODONE 5 MG immediate release tablet Commonly known as: Roxicodone Take 0.5-1 tablets (2.5-5 mg total) by mouth every 4 (four) hours as needed for severe pain.   polyethylene glycol 17 g packet Commonly known as: MIRALAX / GLYCOLAX Take 17 g by mouth daily.   potassium chloride SA 20 MEQ tablet Commonly known as: KLOR-CON M TAKE TWO TABLETS EVERY EVENING AT FIVE What changed: See the new instructions.   senna 8.6 MG Tabs tablet Commonly known as: SENOKOT Take 1 tablet (8.6 mg total) by mouth 2 (two) times daily.   tamsulosin 0.4 MG Caps capsule Commonly known as: FLOMAX TAKE ONE CAPSULE BY MOUTH IN THE MORNING What changed: when to take this   Vitamin D 125 MCG (5000 UT) Caps Take 5,000 Units by mouth daily.   Xarelto 15 MG Tabs tablet Generic drug: Rivaroxaban TAKE 1 TABLET ONCE DAILY WITH SUPPER. What changed: See the new instructions.        Contact information for follow-up providers      [name], MD Follow up in 1 week(s).   Specialty: Family Medicine Contact information: [place] [address] [phone]              Contact information for after-discharge care     Destination     [facility] Preferred SNF .   Service: Skilled Nursing Contact information: [place] [address] [phone]                    Allergies  Allergen Reactions   Ambien [Zolpidem] Other (See Comments)    hallucinations   Codeine Other (See Comments)    headache    Codeine Sulfate Other (See Comments)    REACTION: unspecified   Dulaglutide Diarrhea   Latex Hives   Morphine Sulfate Nausea And Vomiting   Mirtazapine Palpitations    Consultations: Orthopedics   Procedures/Studies: DG Ankle 2 Views Right  Result Date: 03/22/2022 CLINICAL DATA:  Evaluate fracture alignment prior to discharge. EXAM: RIGHT ANKLE - 2 VIEW COMPARISON:  Right ankle radiographs 03/18/2022 and right tibia and fibula 03/18/2022 FINDINGS: Overlying cast material again limits evaluation of fine bony detail. There is again an oblique fracture of the distal fibular diaphysis with up to 2 mm lateral displacement of the distal fracture component respect of the proximal fracture component, unchanged from prior. There is again mild relative widening of the medial tibiotalar clear space, measuring up to approximately 5 mm and unchanged from prior. Subtle lucency within the mid to superomedial malleolus indicating an acute nondisplaced fracture, with now apparent improved anatomic alignment compared to prior. Mild distal medial and lateral malleolar degenerative osteophytosis. Small plantar calcaneal heel spur. Chronic mineralization throughout the proximal plantar fascia. IMPRESSION: 1. Unchanged minimally displaced alignment of the distal fibular oblique fracture. 2. Acute nondisplaced medial malleolar fracture  with the prior mild displacement improved from most recent 03/18/2022 radiographs. 3. Unchanged mild asymmetric widening of the medial tibiotalar clear space. Electronically Signed   By: Yvonne Kendall M.D.   On: 03/22/2022 12:52   DG Chest 2 View  Result Date: 03/19/2022 CLINICAL DATA:  Tachycardia, pneumonia, CHF EXAM: CHEST - 2 VIEW COMPARISON:  Chest radiograph 1 day prior FINDINGS: The cardiomediastinal silhouette is stable with unchanged cardiomegaly. There is worsening left basilar opacity with a small left pleural effusion. There is a suspected trace right pleural effusion. There is vascular congestion  and probable mild pulmonary interstitial edema. There is no pneumothorax. Remote rib fractures are again noted, unchanged. Right shoulder arthroplasty hardware is again noted. IMPRESSION: 1. Small left and trace right pleural effusions with worsened opacity in the left base which may reflect atelectasis or worsening infection. 2. Cardiomegaly with vascular congestion and probable mild pulmonary interstitial edema. Electronically Signed   By: Valetta Mole M.D.   On: 03/19/2022 13:35   DG Ankle 2 Views Right  Result Date: 03/18/2022 CLINICAL DATA:  Pain.  Post splinting. EXAM: RIGHT ANKLE - 2 VIEW COMPARISON:  03/18/2022 FINDINGS: Images obtained through cast material which may obscure bone detail. There is a minimally oblique fracture of the distal right fibular shaft with minimal medial displacement. Alignment is unchanged since prior study. Mildly displaced medial malleolar fractures also without significant change. Degenerative changes in the intertarsal joints. IMPRESSION: Fractures of the distal right fibula and medial malleolus without significant change in alignment post casting. Electronically Signed   By: Lucienne Capers M.D.   On: 03/18/2022 20:39   VAS [place] LOWER EXTREMITY VENOUS (DVT) (ONLY [facility] & [facility])  Result Date: 03/18/2022  Lower Venous DVT Study Patient Name:  Tara Bradley  Date  of Exam:   03/18/2022 Medical Rec #: [id number]         Accession #:    [id number] Date of Birth: [birth year]          Patient Gender: F Patient Age:   6 years Exam Location:  [facility] Procedure:      VAS [place] LOWER EXTREMITY VENOUS (DVT) Referring Phys: Thelma Comp NANAVATI --------------------------------------------------------------------------------  Indications: Pain, Swelling, and Erythema. Patient got foot caught in mobile scooter yesterday.  Limitations: Edema and pain with touch. Comparison Study: No prior study on file Performing Technologist: Sharion Dove RVS  Examination Guidelines: A complete evaluation includes B-mode imaging, spectral Doppler, color Doppler, and power Doppler as needed of all accessible portions of each vessel. Bilateral testing is considered an integral part of a complete examination. Limited examinations for reoccurring indications may be performed as noted. The reflux portion of the exam is performed with the patient in reverse Trendelenburg.  +---------+---------------+---------+-----------+----------+-------------------+ RIGHT    CompressibilityPhasicitySpontaneityPropertiesThrombus Aging      +---------+---------------+---------+-----------+----------+-------------------+ CFV      Full           Yes      Yes                                      +---------+---------------+---------+-----------+----------+-------------------+ SFJ      Full                                                             +---------+---------------+---------+-----------+----------+-------------------+ FV Prox  Full                                                             +---------+---------------+---------+-----------+----------+-------------------+ FV Mid   Full                                                             +---------+---------------+---------+-----------+----------+-------------------+  FV DistalFull                                                              +---------+---------------+---------+-----------+----------+-------------------+ PFV      Full                                                             +---------+---------------+---------+-----------+----------+-------------------+ POP      Full           Yes      Yes                                      +---------+---------------+---------+-----------+----------+-------------------+ PTV      Full                                         patent by color and                                                       Doppler             +---------+---------------+---------+-----------+----------+-------------------+ PERO     Full                                                             +---------+---------------+---------+-----------+----------+-------------------+   +----+---------------+---------+-----------+----------+--------------+ LEFTCompressibilityPhasicitySpontaneityPropertiesThrombus Aging +----+---------------+---------+-----------+----------+--------------+ CFV Full           Yes      Yes                                 +----+---------------+---------+-----------+----------+--------------+    Summary: RIGHT: - There is no evidence of deep vein thrombosis in the lower extremity.  - No cystic structure found in the popliteal fossa. - Ultrasound characteristics of enlarged lymph nodes are noted in the groin.  LEFT: - No evidence of common femoral vein obstruction.  *See table(s) above for measurements and observations. Electronically signed by Orlie Pollen on 03/18/2022 at 2:39:54 PM.    Final    DG Tibia/Fibula Right  Result Date: 03/18/2022 CLINICAL DATA:  Foot pain with erythema and swelling after injury yesterday. EXAM: RIGHT TIBIA AND FIBULA - 2 VIEW; RIGHT ANKLE - COMPLETE 3+ VIEW COMPARISON:  None Available. FINDINGS: The bones are diffusely demineralized. There is a minimally displaced oblique fracture of the distal fibular  diaphysis. There is a nondisplaced intra-articular fracture involving the base of the medial malleolus. No significant widening of the ankle mortise, dislocation or tarsal bone fracture identified. No acute injuries are identified  within the proximal lower leg. There are advanced degenerative changes at the knee with prominent medial compartment osteophytes. Right femoral intramedullary nail is partially imaged. There are edematous changes throughout the soft tissues without evidence of foreign body or soft tissue emphysema. Scattered vascular calcifications are noted. IMPRESSION: Minimally displaced oblique fracture of the distal fibular diaphysis and nondisplaced intra-articular fracture of the medial malleolus. Electronically Signed   By: Richardean Sale M.D.   On: 03/18/2022 09:11   DG Ankle Complete Right  Result Date: 03/18/2022 CLINICAL DATA:  Foot pain with erythema and swelling after injury yesterday. EXAM: RIGHT TIBIA AND FIBULA - 2 VIEW; RIGHT ANKLE - COMPLETE 3+ VIEW COMPARISON:  None Available. FINDINGS: The bones are diffusely demineralized. There is a minimally displaced oblique fracture of the distal fibular diaphysis. There is a nondisplaced intra-articular fracture involving the base of the medial malleolus. No significant widening of the ankle mortise, dislocation or tarsal bone fracture identified. No acute injuries are identified within the proximal lower leg. There are advanced degenerative changes at the knee with prominent medial compartment osteophytes. Right femoral intramedullary nail is partially imaged. There are edematous changes throughout the soft tissues without evidence of foreign body or soft tissue emphysema. Scattered vascular calcifications are noted. IMPRESSION: Minimally displaced oblique fracture of the distal fibular diaphysis and nondisplaced intra-articular fracture of the medial malleolus. Electronically Signed   By: Richardean Sale M.D.   On: 03/18/2022 09:11   DG  Chest Port 1 View  Result Date: 03/18/2022 CLINICAL DATA:  Provided history: Shortness of breath, blunt trauma to medial ankle and distal leg. EXAM: PORTABLE CHEST 1 VIEW COMPARISON:  Prior chest radiographs 01/15/2022 and earlier. Chest CT 02/10/2022. FINDINGS: Cardiomegaly. Aortic atherosclerosis. Bilateral pleural effusions (trace right, small left). Ill-defined opacity within the left lung base. No appreciable airspace consolidation on the right. No evidence of pneumothorax. No acute bony abnormality identified. Chronic bilateral rib fracture deformities. Prominent dextrocurvature of the thoracic spine. Prior right shoulder arthroplasty. Soft tissue anchor within the proximal left humerus. IMPRESSION: 1. Ill-defined opacity within the left lung base. While this may reflect atelectasis, pneumonia is difficult to exclude. 2. Bilateral pleural effusions (trace right, small left). 3. Cardiomegaly. 4. Aortic Atherosclerosis (ICD10-I70.0). 5. Prominent dextrocurvature of the thoracic spine. Electronically Signed   By: Kellie Simmering D.O.   On: 03/18/2022 09:09     Discharge Exam: Vitals:   03/22/22 0923 03/22/22 1115  BP:  107/75  Pulse: (!) 125 (!) 152  Resp:  16  Temp:  98.2 F (36.8 C)  SpO2:  96%   Vitals:   03/22/22 0349 03/22/22 0717 03/22/22 0923 03/22/22 1115  BP: 111/69 (!) 155/85  107/75  Pulse:  (!) 125 (!) 125 (!) 152  Resp:  20  16  Temp: 98 F (36.7 C) 98.1 F (36.7 C)  98.2 F (36.8 C)  TempSrc: Oral Oral  Oral  SpO2: 91% 96%  96%  Weight:      Height:        General: Pt is alert, awake, not in acute distress Cardiovascular: RRR, S1/S2 +, no rubs, no gallops Respiratory: CTA bilaterally, no wheezing, no rhonchi Abdominal: Soft, NT, ND, bowel sounds + Extremities: Has a splint in the right lower extremity.    The results of significant diagnostics from this hospitalization (including imaging, microbiology, ancillary and laboratory) are listed below for reference.      Microbiology: Recent Results (from the past 240 hour(s))  MRSA Next Gen by PCR, Nasal  Status: None   Collection Time: 03/19/22 12:01 AM   Specimen: Nasal Mucosa; Nasal Swab  Result Value Ref Range Status   MRSA by PCR Next Gen NOT DETECTED NOT DETECTED Final    Comment: (NOTE) The GeneXpert MRSA Assay (FDA approved for NASAL specimens only), is one component of a comprehensive MRSA colonization surveillance program. It is not intended to diagnose MRSA infection nor to guide or monitor treatment for MRSA infections. Test performance is not FDA approved in patients less than 31 years old. Performed at [facility], [place] [address], [place], [place] [address]      Labs: BNP (last 3 results) Recent Labs    09/11/21 0353 09/29/21 1104 03/19/22 0024  BNP 442.2* 380.1* 235.3*   Basic Metabolic Panel: Recent Labs  Lab 03/18/22 0841 03/19/22 0024 03/20/22 0020 03/22/22 0906  NA 137 138 138 136  K 4.3 3.8 4.6 4.3  CL 103 96* 99 99  CO2 24 29 32 29  GLUCOSE 255* 135* 156* 234*  BUN 23 20 27* 37*  CREATININE 0.89 0.91 1.08* 1.13*  CALCIUM 8.5* 9.0 8.7* 8.7*  MG 1.7  --   --   --    Liver Function Tests: No results for input(s): "AST", "ALT", "ALKPHOS", "BILITOT", "PROT", "ALBUMIN" in the last 168 hours. No results for input(s): "LIPASE", "AMYLASE" in the last 168 hours. No results for input(s): "AMMONIA" in the last 168 hours. CBC: Recent Labs  Lab 03/18/22 0841 03/19/22 0024 03/20/22 0020 03/22/22 0906  WBC 7.0 6.5 6.1 7.7  NEUTROABS  --   --  4.8 6.2  HGB 10.3* 10.6* 9.8* 10.4*  HCT 32.5* 32.0* 30.5* 32.7*  MCV 95.6 93.3 94.1 93.4  PLT 186 190 174 223   Cardiac Enzymes: No results for input(s): "CKTOTAL", "CKMB", "CKMBINDEX", "TROPONINI" in the last 168 hours. BNP: Invalid input(s): "POCBNP" CBG: Recent Labs  Lab 03/21/22 1132 03/21/22 1541 03/21/22 2102 03/22/22 0605 03/22/22 1117  GLUCAP 234* 309* 169* 143* 192*   D-Dimer No results  for input(s): "DDIMER" in the last 72 hours. Hgb A1c No results for input(s): "HGBA1C" in the last 72 hours. Lipid Profile No results for input(s): "CHOL", "HDL", "LDLCALC", "TRIG", "CHOLHDL", "LDLDIRECT" in the last 72 hours. Thyroid function studies No results for input(s): "TSH", "T4TOTAL", "T3FREE", "THYROIDAB" in the last 72 hours.  Invalid input(s): "FREET3" Anemia work up No results for input(s): "VITAMINB12", "FOLATE", "FERRITIN", "TIBC", "IRON", "RETICCTPCT" in the last 72 hours. Urinalysis    Component Value Date/Time   COLORURINE YELLOW 05/03/2021 0645   APPEARANCEUR CLEAR 05/03/2021 0645   LABSPEC 1.021 05/03/2021 0645   PHURINE 6.0 05/03/2021 0645   GLUCOSEU 150 (A) 05/03/2021 0645   HGBUR NEGATIVE 05/03/2021 0645   BILIRUBINUR NEGATIVE 05/03/2021 0645   BILIRUBINUR n 07/23/2013 1343   KETONESUR 5 (A) 05/03/2021 0645   PROTEINUR NEGATIVE 05/03/2021 0645   UROBILINOGEN 0.2 07/23/2013 1343   UROBILINOGEN 0.2 03/07/2012 2123   NITRITE NEGATIVE 05/03/2021 0645   LEUKOCYTESUR NEGATIVE 05/03/2021 0645   Sepsis Labs Recent Labs  Lab 03/18/22 0841 03/19/22 0024 03/20/22 0020 03/22/22 0906  WBC 7.0 6.5 6.1 7.7   Microbiology Recent Results (from the past 240 hour(s))  MRSA Next Gen by PCR, Nasal     Status: None   Collection Time: 03/19/22 12:01 AM   Specimen: Nasal Mucosa; Nasal Swab  Result Value Ref Range Status   MRSA by PCR Next Gen NOT DETECTED NOT DETECTED Final    Comment: (NOTE) The GeneXpert  MRSA Assay (FDA approved for NASAL specimens only), is one component of a comprehensive MRSA colonization surveillance program. It is not intended to diagnose MRSA infection nor to guide or monitor treatment for MRSA infections. Test performance is not FDA approved in patients less than 45 years old. Performed at [facility], [place] [address] [place], [place] [address]      Time coordinating discharge: Over 30 minutes  SIGNED:   Darliss Cheney,  MD  [facility] 03/22/2022, 1:42 PM *Please note that this is a verbal dictation therefore any spelling or grammatical errors are due to the "[employer]" system interpretation. If 7PM-7AM, please contact night-coverage [email or web address]

## 2022-04-01 ENCOUNTER — Ambulatory Visit: Payer: Medicare Other | Admitting: Orthopedic Surgery

## 2022-04-02 ENCOUNTER — Ambulatory Visit (INDEPENDENT_AMBULATORY_CARE_PROVIDER_SITE_OTHER): Payer: Medicare Other | Admitting: Orthopedic Surgery

## 2022-04-02 ENCOUNTER — Ambulatory Visit (INDEPENDENT_AMBULATORY_CARE_PROVIDER_SITE_OTHER): Payer: Medicare Other

## 2022-04-02 DIAGNOSIS — M25571 Pain in right ankle and joints of right foot: Secondary | ICD-10-CM

## 2022-04-02 NOTE — Progress Notes (Signed)
Orthopedic Surgery Office Note  Tara Bradley is a 88 year old female who is now approximately 2 weeks out from a right bimalleolar ankle fracture.  After discussing treatment options with her, we had decided to proceed with nonoperative management.  She comes in today for routine follow-up.  She states that she is not having any right ankle pain.  She is not having any heel pain.  She is denies numbness and paresthesias.  On exam, she dorsiflexes and plantar flexes her toes.  Her sensation is intact to light touch in the DP/SP/T nerve distributions.  Her foot is warm and well-perfused.  Her splint is intact.  She still has swelling over the anterior aspect of her tibia and it is tender to touch.  It is less tender than when I saw her in the hospital and the swelling has improved.  There is no erythema around this region of the anterior tibia.  X-rays of the right ankle were taken and reviewed today showing the talus is still underneath the tibia.  Her medial malleolus fracture appears to be similar from her inpatient x-rays.  Her lateral malleolus appears to be more angulated.  No callus formation is noted at this time.  Assessment: 88 year old female with right bimalleolar ankle fracture being treated nonoperatively  Plan: I discussed with Tara Bradley the displacement that I noticed in her lateral malleolus fracture on today's x-rays.  However, her talus is still sitting underneath her tibia so we are to continue nonoperative management.  She is still okay if she develops posttraumatic arthritis and would prefer not to do surgery.  I encouraged her to do strengthening exercises when in bed so I wanted to eventually allow her to weight-bear she is not deconditioned.  She can use over-the-counter pain medications for pain relief.  I will see her back in 2 weeks with repeat right ankle x-rays (AP, mortise, lateral).  Callie Fielding, MD Orthopedic Surgeon

## 2022-04-14 ENCOUNTER — Emergency Department (HOSPITAL_COMMUNITY): Payer: Medicare Other

## 2022-04-14 ENCOUNTER — Inpatient Hospital Stay (HOSPITAL_COMMUNITY)
Admission: EM | Admit: 2022-04-14 | Discharge: 2022-04-20 | DRG: 690 | Disposition: A | Discharge status: Skilled Nursing Facility | Payer: Medicare Other | Source: Skilled Nursing Facility | Attending: Family Medicine | Admitting: Family Medicine

## 2022-04-14 ENCOUNTER — Other Ambulatory Visit: Payer: Self-pay

## 2022-04-14 DIAGNOSIS — N12 Tubulo-interstitial nephritis, not specified as acute or chronic: Secondary | ICD-10-CM

## 2022-04-14 DIAGNOSIS — I959 Hypotension, unspecified: Secondary | ICD-10-CM | POA: Diagnosis present

## 2022-04-14 DIAGNOSIS — R109 Unspecified abdominal pain: Principal | ICD-10-CM

## 2022-04-14 DIAGNOSIS — R338 Other retention of urine: Secondary | ICD-10-CM | POA: Diagnosis present

## 2022-04-14 DIAGNOSIS — I4821 Permanent atrial fibrillation: Secondary | ICD-10-CM | POA: Diagnosis present

## 2022-04-14 DIAGNOSIS — R339 Retention of urine, unspecified: Secondary | ICD-10-CM

## 2022-04-14 DIAGNOSIS — Z885 Allergy status to narcotic agent status: Secondary | ICD-10-CM

## 2022-04-14 DIAGNOSIS — I482 Chronic atrial fibrillation, unspecified: Secondary | ICD-10-CM | POA: Diagnosis present

## 2022-04-14 DIAGNOSIS — I472 Ventricular tachycardia, unspecified: Secondary | ICD-10-CM | POA: Diagnosis not present

## 2022-04-14 DIAGNOSIS — Z66 Do not resuscitate: Secondary | ICD-10-CM | POA: Diagnosis present

## 2022-04-14 DIAGNOSIS — N179 Acute kidney failure, unspecified: Secondary | ICD-10-CM | POA: Insufficient documentation

## 2022-04-14 DIAGNOSIS — Z7901 Long term (current) use of anticoagulants: Secondary | ICD-10-CM

## 2022-04-14 DIAGNOSIS — F5104 Psychophysiologic insomnia: Secondary | ICD-10-CM | POA: Diagnosis present

## 2022-04-14 DIAGNOSIS — Z794 Long term (current) use of insulin: Secondary | ICD-10-CM

## 2022-04-14 DIAGNOSIS — Z8249 Family history of ischemic heart disease and other diseases of the circulatory system: Secondary | ICD-10-CM

## 2022-04-14 DIAGNOSIS — D649 Anemia, unspecified: Secondary | ICD-10-CM | POA: Insufficient documentation

## 2022-04-14 DIAGNOSIS — S82841A Displaced bimalleolar fracture of right lower leg, initial encounter for closed fracture: Secondary | ICD-10-CM | POA: Diagnosis present

## 2022-04-14 DIAGNOSIS — R9431 Abnormal electrocardiogram [ECG] [EKG]: Secondary | ICD-10-CM | POA: Insufficient documentation

## 2022-04-14 DIAGNOSIS — Z79899 Other long term (current) drug therapy: Secondary | ICD-10-CM

## 2022-04-14 DIAGNOSIS — I4891 Unspecified atrial fibrillation: Secondary | ICD-10-CM | POA: Diagnosis present

## 2022-04-14 DIAGNOSIS — E860 Dehydration: Secondary | ICD-10-CM | POA: Diagnosis present

## 2022-04-14 DIAGNOSIS — Z833 Family history of diabetes mellitus: Secondary | ICD-10-CM

## 2022-04-14 DIAGNOSIS — R197 Diarrhea, unspecified: Secondary | ICD-10-CM | POA: Insufficient documentation

## 2022-04-14 DIAGNOSIS — I5032 Chronic diastolic (congestive) heart failure: Secondary | ICD-10-CM | POA: Diagnosis present

## 2022-04-14 DIAGNOSIS — Z9010 Acquired absence of unspecified breast and nipple: Secondary | ICD-10-CM

## 2022-04-14 DIAGNOSIS — N10 Acute tubulo-interstitial nephritis: Principal | ICD-10-CM | POA: Diagnosis present

## 2022-04-14 DIAGNOSIS — Z807 Family history of other malignant neoplasms of lymphoid, hematopoietic and related tissues: Secondary | ICD-10-CM

## 2022-04-14 DIAGNOSIS — N390 Urinary tract infection, site not specified: Secondary | ICD-10-CM

## 2022-04-14 DIAGNOSIS — Z8679 Personal history of other diseases of the circulatory system: Secondary | ICD-10-CM

## 2022-04-14 DIAGNOSIS — Z853 Personal history of malignant neoplasm of breast: Secondary | ICD-10-CM

## 2022-04-14 DIAGNOSIS — E785 Hyperlipidemia, unspecified: Secondary | ICD-10-CM | POA: Diagnosis present

## 2022-04-14 DIAGNOSIS — J9611 Chronic respiratory failure with hypoxia: Secondary | ICD-10-CM | POA: Diagnosis present

## 2022-04-14 DIAGNOSIS — I110 Hypertensive heart disease with heart failure: Secondary | ICD-10-CM | POA: Diagnosis present

## 2022-04-14 DIAGNOSIS — E119 Type 2 diabetes mellitus without complications: Secondary | ICD-10-CM

## 2022-04-14 DIAGNOSIS — Z91040 Latex allergy status: Secondary | ICD-10-CM

## 2022-04-14 DIAGNOSIS — Z888 Allergy status to other drugs, medicaments and biological substances status: Secondary | ICD-10-CM

## 2022-04-14 DIAGNOSIS — Z96611 Presence of right artificial shoulder joint: Secondary | ICD-10-CM | POA: Diagnosis present

## 2022-04-14 LAB — CBC WITH DIFFERENTIAL/PLATELET
Abs Immature Granulocytes: 0.07 10*3/uL (ref 0.00–0.07)
Basophils Absolute: 0 10*3/uL (ref 0.0–0.1)
Basophils Relative: 1 %
Eosinophils Absolute: 0 10*3/uL (ref 0.0–0.5)
Eosinophils Relative: 0 %
HCT: 30.3 % — ABNORMAL LOW (ref 36.0–46.0)
Hemoglobin: 9.6 g/dL — ABNORMAL LOW (ref 12.0–15.0)
Immature Granulocytes: 1 %
Lymphocytes Relative: 6 %
Lymphs Abs: 0.5 10*3/uL — ABNORMAL LOW (ref 0.7–4.0)
MCH: 29.4 pg (ref 26.0–34.0)
MCHC: 31.7 g/dL (ref 30.0–36.0)
MCV: 92.7 fL (ref 80.0–100.0)
Monocytes Absolute: 0.8 10*3/uL (ref 0.1–1.0)
Monocytes Relative: 9 %
Neutro Abs: 6.6 10*3/uL (ref 1.7–7.7)
Neutrophils Relative %: 83 %
Platelets: 298 10*3/uL (ref 150–400)
RBC: 3.27 MIL/uL — ABNORMAL LOW (ref 3.87–5.11)
RDW: 16 % — ABNORMAL HIGH (ref 11.5–15.5)
WBC: 8 10*3/uL (ref 4.0–10.5)
nRBC: 0 % (ref 0.0–0.2)

## 2022-04-14 LAB — URINALYSIS, ROUTINE W REFLEX MICROSCOPIC
Bilirubin Urine: NEGATIVE
Glucose, UA: NEGATIVE mg/dL
Ketones, ur: NEGATIVE mg/dL
Nitrite: NEGATIVE
Protein, ur: 30 mg/dL — AB
Specific Gravity, Urine: 1.006 (ref 1.005–1.030)
WBC, UA: >50 WBC/hpf — ABNORMAL HIGH (ref 0–5)
pH: 8 (ref 5.0–8.0)

## 2022-04-14 LAB — COMPREHENSIVE METABOLIC PANEL
ALT: 7 U/L (ref 0–44)
AST: 16 U/L (ref 15–41)
Albumin: 2.5 g/dL — ABNORMAL LOW (ref 3.5–5.0)
Alkaline Phosphatase: 97 U/L (ref 38–126)
Anion gap: 12 (ref 5–15)
BUN: 49 mg/dL — ABNORMAL HIGH (ref 8–23)
CO2: 28 mmol/L (ref 22–32)
Calcium: 8.5 mg/dL — ABNORMAL LOW (ref 8.9–10.3)
Chloride: 92 mmol/L — ABNORMAL LOW (ref 98–111)
Creatinine, Ser: 1.43 mg/dL — ABNORMAL HIGH (ref 0.44–1.00)
GFR, Estimated: 33 mL/min — ABNORMAL LOW (ref >60)
Glucose, Bld: 70 mg/dL (ref 70–99)
Potassium: 4.7 mmol/L (ref 3.5–5.1)
Sodium: 132 mmol/L — ABNORMAL LOW (ref 135–145)
Total Bilirubin: 0.5 mg/dL (ref 0.3–1.2)
Total Protein: 5.7 g/dL — ABNORMAL LOW (ref 6.5–8.1)

## 2022-04-14 LAB — TYPE AND SCREEN
ABO/RH(D): O POS
Antibody Screen: NEGATIVE

## 2022-04-14 LAB — MAGNESIUM: Magnesium: 2.1 mg/dL (ref 1.7–2.4)

## 2022-04-14 LAB — PROTIME-INR
INR: 1.3 — ABNORMAL HIGH (ref 0.8–1.2)
Prothrombin Time: 15.8 seconds — ABNORMAL HIGH (ref 11.4–15.2)

## 2022-04-14 LAB — APTT: aPTT: 35 seconds (ref 24–36)

## 2022-04-14 LAB — LACTIC ACID, PLASMA: Lactic Acid, Venous: 0.9 mmol/L (ref 0.5–1.9)

## 2022-04-14 LAB — LIPASE, BLOOD: Lipase: 28 U/L (ref 11–51)

## 2022-04-14 MED ORDER — IOHEXOL 350 MG/ML SOLN
65.0000 mL | Freq: Once | INTRAVENOUS | Status: AC | PRN
  Administered 2022-04-14: 65 mL via INTRAVENOUS

## 2022-04-14 MED ORDER — METOPROLOL TARTRATE 5 MG/5ML IV SOLN
5.0000 mg | Freq: Once | INTRAVENOUS | Status: AC
  Administered 2022-04-14: 5 mg via INTRAVENOUS
  Filled 2022-04-14: qty 5

## 2022-04-14 MED ORDER — SODIUM CHLORIDE 0.9 % IV SOLN
1.0000 g | Freq: Once | INTRAVENOUS | Status: AC
  Administered 2022-04-14: 1 g via INTRAVENOUS
  Filled 2022-04-14: qty 10

## 2022-04-14 MED ORDER — LACTATED RINGERS IV BOLUS
1000.0000 mL | Freq: Once | INTRAVENOUS | Status: AC
  Administered 2022-04-14: 1000 mL via INTRAVENOUS

## 2022-04-14 MED ORDER — DILTIAZEM HCL-DEXTROSE 125-5 MG/125ML-% IV SOLN (PREMIX)
5.0000 mg/h | INTRAVENOUS | Status: DC
  Administered 2022-04-14: 5 mg/h via INTRAVENOUS
  Filled 2022-04-14: qty 125

## 2022-04-14 MED ORDER — ONDANSETRON HCL 4 MG/2ML IJ SOLN: 4.0000 mg | Freq: Once | INTRAMUSCULAR | Status: DC | PRN

## 2022-04-14 NOTE — ED Provider Notes (Signed)
[facility] EMERGENCY DEPARTMENT Provider Note   CSN: [id number] Arrival date & time: 04/14/22  1641     History  Chief Complaint  Patient presents with   Abdominal Pain    Tara Bradley is a 88 y.o. female.  With PMH of DM 2, thoracic aortic aneurysm, chronic A-fib on Xarelto, HTN, HLD who presents with increasing abdominal distention and discomfort associate with nonbloody diarrhea ongoing for the past couple of weeks.  Patient was brought here because she has had increasing lower abdominal pain and distention over the past couple of days with the nonbloody diarrhea.  She has also had decreased p.o. intake with nausea and nonbloody nonbilious emesis.  She denies any chest pain, shortness of breath, fevers or chills.  She has not been looking at her bowel movements but does not think they have been bloody.  She denies any abdominal injuries.  She came from [facility] who was concerned she was intermittently confused and less responsive from baseline.   Abdominal Pain      Home Medications Prior to Admission medications   Medication Sig Start Date End Date Taking? Authorizing Provider  BD PEN NEEDLE NANO U/F 32G X 4 MM MISC Inject into the skin daily. 11/25/20   [provider]  Calcium Carbonate-Vitamin D 600-400 MG-UNIT tablet Take 1 tablet by mouth daily.    [provider]  Cholecalciferol (VITAMIN D) 125 MCG (5000 UT) CAPS Take 5,000 Units by mouth daily.    [provider]  diltiazem (CARDIZEM CD) 240 MG 24 hr capsule Take 2 capsules (480 mg total) by mouth daily. 03/23/22 04/22/22  Darliss Cheney, MD  ferrous gluconate (FERGON) 324 MG tablet Take 324 mg by mouth 2 (two) times daily with a meal.    [provider]  furosemide (LASIX) 80 MG tablet TAKE ONE TABLET TWICE DAILY AT 8am AND 9pm FOR edema/chf Patient taking differently: Take 80 mg by mouth 2 (two) times daily. 12/22/21   [name], MD  insulin glargine  (LANTUS) 100 UNIT/ML Solostar Pen Inject 8 Units into the skin daily.    [provider]  metFORMIN (GLUMETZA) 500 MG (MOD) 24 hr tablet Take 1,000 mg by mouth every evening.    [provider]  metoprolol succinate (TOPROL-XL) 100 MG 24 hr tablet TAKE ONE TABLET BY MOUTH EVERY MORNING Patient taking differently: Take 100 mg by mouth daily. 03/10/22   [name], MD  oxyCODONE (ROXICODONE) 5 MG immediate release tablet Take 0.5-1 tablets (2.5-5 mg total) by mouth every 4 (four) hours as needed for severe pain. 03/22/22   Callie Fielding, MD  polyethylene glycol (MIRALAX / GLYCOLAX) 17 g packet Take 17 g by mouth daily. 05/12/21   Shelly Coss, MD  potassium chloride SA (KLOR-CON M) 20 MEQ tablet TAKE TWO TABLETS EVERY EVENING AT FIVE Patient taking differently: Take 20 mEq by mouth daily. 12/22/21   [name], MD  senna (SENOKOT) 8.6 MG TABS tablet Take 1 tablet (8.6 mg total) by mouth 2 (two) times daily. 08/07/21   Shelly Coss, MD  tamsulosin (FLOMAX) 0.4 MG CAPS capsule TAKE ONE CAPSULE BY MOUTH IN THE MORNING Patient taking differently: Take 0.4 mg by mouth daily. 01/29/22   [name], MD  XARELTO 15 MG TABS tablet TAKE 1 TABLET ONCE DAILY WITH SUPPER. Patient taking differently: Take 15 mg by mouth daily with supper. 06/22/21   [name], MD      Allergies    Ambien [  zolpidem], Codeine, Dulaglutide, Latex, Morphine sulfate, and Mirtazapine    Review of Systems   Review of Systems  Gastrointestinal:  Positive for abdominal pain.    Physical Exam Updated Vital Signs BP 93/69   Pulse (!) 128   Temp 98 F (36.7 C) (Oral)   Resp 18   Ht 5' 5.5" (1.664 m)   Wt 65.3 kg   SpO2 99%   BMI 23.60 kg/m  Physical Exam Constitutional: Alert and orientedx4, nontoxic Eyes: Conjunctivae are normal. ENT      Head: Normocephalic and atraumatic.      Nose: No congestion.      Mouth/Throat: Mucous membranes are moist.      Neck: No  stridor. Cardiovascular: Irregularly irregular rhythm, tachycardic, equal palpable radial pulses, distal extremities warm well-perfused Respiratory: Normal respiratory effort. Breath sounds are normal.  O2 sat 100 on RA Gastrointestinal: Soft and distended with lower suprapubic mass palpated and tenderness to palpation Musculoskeletal: Normal range of motion in all extremities. Right leg in splint sensation intact Neurologic: Normal speech and language. No gross focal neurologic deficits are appreciated. Skin: Skin is warm, dry and intact. No rash noted. Psychiatric: Mood and affect are normal. Speech and behavior are normal.  ED Results / Procedures / Treatments   Labs (all labs ordered are listed, but only abnormal results are displayed) Labs Reviewed  COMPREHENSIVE METABOLIC PANEL - Abnormal; Notable for the following components:      Result Value   Sodium 132 (*)    Chloride 92 (*)    BUN 49 (*)    Creatinine, Ser 1.43 (*)    Calcium 8.5 (*)    Total Protein 5.7 (*)    Albumin 2.5 (*)    GFR, Estimated 33 (*)    All other components within normal limits  CBC WITH DIFFERENTIAL/PLATELET - Abnormal; Notable for the following components:   RBC 3.27 (*)    Hemoglobin 9.6 (*)    HCT 30.3 (*)    RDW 16.0 (*)    Lymphs Abs 0.5 (*)    All other components within normal limits  PROTIME-INR - Abnormal; Notable for the following components:   Prothrombin Time 15.8 (*)    INR 1.3 (*)    All other components within normal limits  URINALYSIS, ROUTINE W REFLEX MICROSCOPIC - Abnormal; Notable for the following components:   APPearance CLOUDY (*)    Hgb urine dipstick SMALL (*)    Protein, ur 30 (*)    Leukocytes,Ua LARGE (*)    WBC, UA >50 (*)    Bacteria, UA MANY (*)    All other components within normal limits  URINE CULTURE  LIPASE, BLOOD  LACTIC ACID, PLASMA  APTT  MAGNESIUM  TYPE AND SCREEN    EKG EKG Interpretation  Date/Time:  Wednesday April 14 2022 17:46:02  EST Ventricular Rate:  140 PR Interval:    QRS Duration: 74 QT Interval:  350 QTC Calculation: 535 R Axis:   -61 Text Interpretation: Atrial fibrillation with rapid V-rate Left anterior fascicular block Probable anterior infarct, age indeterminate No acute changes Confirmed by Georgina Snell [phone]) on 04/14/2022 5:49:22 PM  Radiology CT ABDOMEN PELVIS W CONTRAST  Result Date: 04/14/2022 CLINICAL DATA:  Abdominal pain, urinary retention EXAM: CT ABDOMEN AND PELVIS WITH CONTRAST TECHNIQUE: Multidetector CT imaging of the abdomen and pelvis was performed using the standard protocol following bolus administration of intravenous contrast. RADIATION DOSE REDUCTION: This exam was performed according to the departmental dose-optimization program which includes automated  exposure control, adjustment of the mA and/or kV according to patient size and/or use of iterative reconstruction technique. CONTRAST:  45m OMNIPAQUE IOHEXOL 350 MG/ML SOLN COMPARISON:  09/28/2016 FINDINGS: Lower chest: Small bilateral pleural effusions. Compressive atelectasis in the lower lobes. Diffuse low-density throughout the liver suggesting fatty infiltration. Peripheral cyst in the right hepatic lobe measures 2 cm. Small layering gallstones within the gallbladder. Hepatobiliary: No focal abnormality or ductal dilatation. Pancreas: No focal abnormality.  Normal size. Spleen: No focal abnormality.  Normal size. Adrenals/Urinary Tract: Numerous bilateral small renal cysts, the largest in the right midpole measuring 16 mm. No follow-up imaging recommended. No hydronephrosis. Areas of decreased enhancement with striations in the right kidney compatible with pyelonephritis. Adrenal glands unremarkable. Foley catheter present in the bladder. Stomach/Bowel: Few scattered sigmoid diverticula. No active diverticulitis. Stomach and small bowel decompressed, grossly unremarkable. Vascular/Lymphatic: Aortic atherosclerosis. No evidence of  aneurysm or adenopathy. Reproductive: Prior hysterectomy.  No adnexal masses. Other: Trace free fluid in the pelvis.  No free air. Musculoskeletal: No acute bony abnormality. IMPRESSION: Areas of decreased enhancement and striations within the right kidney compatible with pyelonephritis. Cholelithiasis. Aortic atherosclerosis. Electronically Signed   By: KRolm BaptiseM.D.   On: 04/14/2022 21:14    Procedures .Critical Care  Performed by: BElgie Congo MD Authorized by: BElgie Congo MD   Critical care provider statement:    Critical care time (minutes):  40   Critical care was time spent personally by me on the following activities:  Development of treatment plan with patient or surrogate, discussions with consultants, evaluation of patient's response to treatment, examination of patient, ordering and review of laboratory studies, ordering and review of radiographic studies, ordering and performing treatments and interventions, pulse oximetry, re-evaluation of patient's condition and review of old charts   Care discussed with: admitting provider       Medications Ordered in ED Medications  ondansetron (ZOFRAN) injection 4 mg (has no administration in time range)  diltiazem (CARDIZEM) 125 mg in dextrose 5% 125 mL (1 mg/mL) infusion (5 mg/hr Intravenous New Bag/Given 04/14/22 2207)  lactated ringers bolus 1,000 mL (0 mLs Intravenous Stopped 04/14/22 1837)  cefTRIAXone (ROCEPHIN) 1 g in sodium chloride 0.9 % 100 mL IVPB (0 g Intravenous Stopped 04/14/22 2000)  metoprolol tartrate (LOPRESSOR) injection 5 mg (5 mg Intravenous Given 04/14/22 1856)  iohexol (OMNIPAQUE) 350 MG/ML injection 65 mL (65 mLs Intravenous Contrast Given 04/14/22 2054)  lactated ringers bolus 1,000 mL (1,000 mLs Intravenous New Bag/Given 04/14/22 2208)    ED Course/ Medical Decision Making/ A&P Clinical Course as of 04/14/22 2226  Wed Apr 14, 2022  2013 Discussed case with CT radiology tech to pursue  imaging of CT scan with contrast of abdomen and pelvis since her AKI is likely secondary to dehydration and UTI which she is currently receiving IV fluids and antibiotics for. [VB]  2225 Spoke with on-call hospitalist who said to sign out to the oncoming ED provider which I explained that will not be 1 since this Currently in closes down.  She will be admitting patient but may be hours.  Will update [facility] provider regarding patient to keep an eye on patient while waiting on admission orders. [VB]    Clinical Course User Index [VB] BElgie Congo MD                           Medical Decision Making EBLANNIE SHEDLOCKis a 88y.o. female.  With PMH of DM 2, thoracic aortic aneurysm, chronic A-fib on Xarelto, HTN, HLD who presents with increasing abdominal distention and discomfort associate with nonbloody diarrhea ongoing for the past couple of weeks.   Patient presented in A-fib RVR but otherwise remained hemodynamically stable with normal blood pressure 108/86 and no increased work of breathing.   Based on patient's history and presentation, considered possible gastroenteritis versus UTI versus pyelonephritis versus bowel obstruction versus internal abdominal mass.  Performed bedside ultrasound which showed significant urinary distention and sediment within the bladder.  Indwelling Foley placed with return of approximately 2300 cc of urine.  UA obtained which showed evidence of UTI with large leukocyte esterase, protein urea, many bacteria and greater than 50 WBCs.  She was started on IV Rocephin for UTI and possible associated pyelonephritis.  CTAP obtained which I personally reviewed showed evidence of pyelonephritis.  Patient in persistent A-fib RVR and despite treating pyelonephritis with fluids and antibiotics and dose of metoprolol it remained persistent RVR, started patient on Cardizem drip.  Discussed case with on-call hospitalist will admit patient for continued management of  A-fib RVR, AKI and pyelonephritis.  Amount and/or Complexity of Data Reviewed Labs: ordered. Radiology: ordered.  Risk Prescription drug management. Decision regarding hospitalization.    Final Clinical Impression(s) / ED Diagnoses Final diagnoses:  Abdominal pain, unspecified abdominal location  Atrial fibrillation with RVR ([place])  Urinary tract infection without hematuria, site unspecified  Urinary retention  AKI (acute kidney injury) ([place])  Pyelonephritis    Rx / DC Orders ED Discharge Orders     None         Elgie Congo, MD 04/14/22 2226

## 2022-04-14 NOTE — ED Triage Notes (Signed)
Pt BIB EMS from [facility]. Per the facility, she was having diarrhea for the last six weeks. Pt is now having rigidity and distention in her abdomen. Patient is currently denying pain. Per the facility, she is normally more alert. She is A&O x4.   EMS Vitals 110/70  100% on 3L  CBG 100 RR 20 A-fib at 120-140

## 2022-04-15 ENCOUNTER — Inpatient Hospital Stay (HOSPITAL_COMMUNITY): Payer: Medicare Other

## 2022-04-15 DIAGNOSIS — Z9010 Acquired absence of unspecified breast and nipple: Secondary | ICD-10-CM | POA: Diagnosis not present

## 2022-04-15 DIAGNOSIS — I959 Hypotension, unspecified: Secondary | ICD-10-CM | POA: Diagnosis present

## 2022-04-15 DIAGNOSIS — E119 Type 2 diabetes mellitus without complications: Secondary | ICD-10-CM

## 2022-04-15 DIAGNOSIS — D649 Anemia, unspecified: Secondary | ICD-10-CM | POA: Insufficient documentation

## 2022-04-15 DIAGNOSIS — I5032 Chronic diastolic (congestive) heart failure: Secondary | ICD-10-CM | POA: Diagnosis present

## 2022-04-15 DIAGNOSIS — R197 Diarrhea, unspecified: Secondary | ICD-10-CM | POA: Diagnosis present

## 2022-04-15 DIAGNOSIS — J9611 Chronic respiratory failure with hypoxia: Secondary | ICD-10-CM | POA: Diagnosis present

## 2022-04-15 DIAGNOSIS — N10 Acute tubulo-interstitial nephritis: Secondary | ICD-10-CM | POA: Diagnosis present

## 2022-04-15 DIAGNOSIS — N179 Acute kidney failure, unspecified: Secondary | ICD-10-CM | POA: Diagnosis present

## 2022-04-15 DIAGNOSIS — Z853 Personal history of malignant neoplasm of breast: Secondary | ICD-10-CM | POA: Diagnosis not present

## 2022-04-15 DIAGNOSIS — Z807 Family history of other malignant neoplasms of lymphoid, hematopoietic and related tissues: Secondary | ICD-10-CM | POA: Diagnosis not present

## 2022-04-15 DIAGNOSIS — Z66 Do not resuscitate: Secondary | ICD-10-CM | POA: Diagnosis present

## 2022-04-15 DIAGNOSIS — E860 Dehydration: Secondary | ICD-10-CM | POA: Diagnosis present

## 2022-04-15 DIAGNOSIS — Z96611 Presence of right artificial shoulder joint: Secondary | ICD-10-CM | POA: Diagnosis present

## 2022-04-15 DIAGNOSIS — Z833 Family history of diabetes mellitus: Secondary | ICD-10-CM | POA: Diagnosis not present

## 2022-04-15 DIAGNOSIS — F5104 Psychophysiologic insomnia: Secondary | ICD-10-CM | POA: Diagnosis present

## 2022-04-15 DIAGNOSIS — I110 Hypertensive heart disease with heart failure: Secondary | ICD-10-CM | POA: Diagnosis present

## 2022-04-15 DIAGNOSIS — I472 Ventricular tachycardia, unspecified: Secondary | ICD-10-CM | POA: Diagnosis not present

## 2022-04-15 DIAGNOSIS — I4821 Permanent atrial fibrillation: Secondary | ICD-10-CM | POA: Diagnosis present

## 2022-04-15 DIAGNOSIS — R339 Retention of urine, unspecified: Secondary | ICD-10-CM | POA: Diagnosis present

## 2022-04-15 DIAGNOSIS — R9431 Abnormal electrocardiogram [ECG] [EKG]: Secondary | ICD-10-CM | POA: Insufficient documentation

## 2022-04-15 DIAGNOSIS — I4891 Unspecified atrial fibrillation: Secondary | ICD-10-CM | POA: Diagnosis not present

## 2022-04-15 DIAGNOSIS — Z79899 Other long term (current) drug therapy: Secondary | ICD-10-CM | POA: Diagnosis not present

## 2022-04-15 DIAGNOSIS — Z7901 Long term (current) use of anticoagulants: Secondary | ICD-10-CM | POA: Diagnosis not present

## 2022-04-15 DIAGNOSIS — Z8249 Family history of ischemic heart disease and other diseases of the circulatory system: Secondary | ICD-10-CM | POA: Diagnosis not present

## 2022-04-15 DIAGNOSIS — I482 Chronic atrial fibrillation, unspecified: Secondary | ICD-10-CM | POA: Diagnosis not present

## 2022-04-15 DIAGNOSIS — E785 Hyperlipidemia, unspecified: Secondary | ICD-10-CM | POA: Diagnosis present

## 2022-04-15 DIAGNOSIS — Z794 Long term (current) use of insulin: Secondary | ICD-10-CM | POA: Diagnosis not present

## 2022-04-15 LAB — LACTIC ACID, PLASMA: Lactic Acid, Venous: 1 mmol/L (ref 0.5–1.9)

## 2022-04-15 LAB — CBG MONITORING, ED: Glucose-Capillary: 85 mg/dL (ref 70–99)

## 2022-04-15 LAB — BASIC METABOLIC PANEL
Anion gap: 13 (ref 5–15)
BUN: 41 mg/dL — ABNORMAL HIGH (ref 8–23)
CO2: 29 mmol/L (ref 22–32)
Calcium: 8.7 mg/dL — ABNORMAL LOW (ref 8.9–10.3)
Chloride: 90 mmol/L — ABNORMAL LOW (ref 98–111)
Creatinine, Ser: 1.22 mg/dL — ABNORMAL HIGH (ref 0.44–1.00)
GFR, Estimated: 40 mL/min — ABNORMAL LOW (ref >60)
Glucose, Bld: 92 mg/dL (ref 70–99)
Potassium: 3.6 mmol/L (ref 3.5–5.1)
Sodium: 132 mmol/L — ABNORMAL LOW (ref 135–145)

## 2022-04-15 LAB — CBC
HCT: 28.8 % — ABNORMAL LOW (ref 36.0–46.0)
Hemoglobin: 9.4 g/dL — ABNORMAL LOW (ref 12.0–15.0)
MCH: 29.9 pg (ref 26.0–34.0)
MCHC: 32.6 g/dL (ref 30.0–36.0)
MCV: 91.7 fL (ref 80.0–100.0)
Platelets: 293 10*3/uL (ref 150–400)
RBC: 3.14 MIL/uL — ABNORMAL LOW (ref 3.87–5.11)
RDW: 16 % — ABNORMAL HIGH (ref 11.5–15.5)
WBC: 10.3 10*3/uL (ref 4.0–10.5)
nRBC: 0 % (ref 0.0–0.2)

## 2022-04-15 LAB — BRAIN NATRIURETIC PEPTIDE: B Natriuretic Peptide: 750.3 pg/mL — ABNORMAL HIGH (ref 0.0–100.0)

## 2022-04-15 LAB — GLUCOSE, CAPILLARY
Glucose-Capillary: 122 mg/dL — ABNORMAL HIGH (ref 70–99)
Glucose-Capillary: 136 mg/dL — ABNORMAL HIGH (ref 70–99)
Glucose-Capillary: 247 mg/dL — ABNORMAL HIGH (ref 70–99)

## 2022-04-15 LAB — TSH: TSH: 3.529 u[IU]/mL (ref 0.350–4.500)

## 2022-04-15 MED ORDER — POLYETHYLENE GLYCOL 3350 17 G PO PACK
17.0000 g | PACK | Freq: Every day | ORAL | Status: DC
  Administered 2022-04-19: 17 g via ORAL
  Filled 2022-04-15 (×2): qty 1

## 2022-04-15 MED ORDER — INSULIN ASPART 100 UNIT/ML IJ SOLN
0.0000 [IU] | Freq: Three times a day (TID) | INTRAMUSCULAR | Status: DC
  Administered 2022-04-15 (×2): 1 [IU] via SUBCUTANEOUS
  Administered 2022-04-16: 2 [IU] via SUBCUTANEOUS
  Administered 2022-04-17: 3 [IU] via SUBCUTANEOUS
  Administered 2022-04-17: 2 [IU] via SUBCUTANEOUS
  Administered 2022-04-18: 1 [IU] via SUBCUTANEOUS
  Administered 2022-04-18: 3 [IU] via SUBCUTANEOUS
  Administered 2022-04-19: 2 [IU] via SUBCUTANEOUS

## 2022-04-15 MED ORDER — MELATONIN 3 MG PO TABS
3.0000 mg | ORAL_TABLET | Freq: Once | ORAL | Status: AC | PRN
  Administered 2022-04-16: 3 mg via ORAL
  Filled 2022-04-15: qty 1

## 2022-04-15 MED ORDER — LACTATED RINGERS IV SOLN: INTRAVENOUS | Status: DC

## 2022-04-15 MED ORDER — INSULIN ASPART 100 UNIT/ML IJ SOLN
0.0000 [IU] | Freq: Every day | INTRAMUSCULAR | Status: DC
  Administered 2022-04-15: 2 [IU] via SUBCUTANEOUS
  Administered 2022-04-16: 3 [IU] via SUBCUTANEOUS
  Administered 2022-04-17: 2 [IU] via SUBCUTANEOUS

## 2022-04-15 MED ORDER — DILTIAZEM HCL ER COATED BEADS 180 MG PO CP24
480.0000 mg | ORAL_CAPSULE | Freq: Every day | ORAL | Status: DC
  Administered 2022-04-16 – 2022-04-20 (×5): 480 mg via ORAL
  Filled 2022-04-15 (×5): qty 1

## 2022-04-15 MED ORDER — METOPROLOL SUCCINATE ER 100 MG PO TB24
100.0000 mg | ORAL_TABLET | Freq: Every morning | ORAL | Status: DC
  Administered 2022-04-15 – 2022-04-20 (×6): 100 mg via ORAL
  Filled 2022-04-15 (×3): qty 1
  Filled 2022-04-15: qty 4
  Filled 2022-04-15 (×2): qty 1

## 2022-04-15 MED ORDER — TAMSULOSIN HCL 0.4 MG PO CAPS
0.4000 mg | ORAL_CAPSULE | Freq: Every day | ORAL | Status: DC
  Administered 2022-04-15 – 2022-04-20 (×6): 0.4 mg via ORAL
  Filled 2022-04-15 (×6): qty 1

## 2022-04-15 MED ORDER — ACETAMINOPHEN 650 MG RE SUPP: 650.0000 mg | Freq: Four times a day (QID) | RECTAL | Status: DC | PRN

## 2022-04-15 MED ORDER — SODIUM CHLORIDE 0.9 % IV SOLN
1.0000 g | INTRAVENOUS | Status: DC
  Administered 2022-04-15: 1 g via INTRAVENOUS
  Filled 2022-04-15: qty 10

## 2022-04-15 MED ORDER — FERROUS GLUCONATE 324 (38 FE) MG PO TABS
324.0000 mg | ORAL_TABLET | Freq: Two times a day (BID) | ORAL | Status: DC
  Administered 2022-04-15 – 2022-04-20 (×11): 324 mg via ORAL
  Filled 2022-04-15 (×14): qty 1

## 2022-04-15 MED ORDER — DILTIAZEM HCL-DEXTROSE 125-5 MG/125ML-% IV SOLN (PREMIX)
5.0000 mg/h | INTRAVENOUS | Status: DC
  Administered 2022-04-15: 15 mg/h via INTRAVENOUS
  Administered 2022-04-15: 10 mg/h via INTRAVENOUS
  Administered 2022-04-16: 15 mg/h via INTRAVENOUS
  Filled 2022-04-15 (×3): qty 125

## 2022-04-15 MED ORDER — DILTIAZEM HCL 25 MG/5ML IV SOLN
10.0000 mg | Freq: Once | INTRAVENOUS | Status: AC
  Administered 2022-04-15: 10 mg via INTRAVENOUS
  Filled 2022-04-15: qty 5

## 2022-04-15 MED ORDER — SENNA 8.6 MG PO TABS
1.0000 | ORAL_TABLET | Freq: Two times a day (BID) | ORAL | Status: DC
  Administered 2022-04-15 – 2022-04-19 (×6): 8.6 mg via ORAL
  Filled 2022-04-15 (×7): qty 1

## 2022-04-15 MED ORDER — ORAL CARE MOUTH RINSE: 15.0000 mL | OROMUCOSAL | Status: DC | PRN

## 2022-04-15 MED ORDER — RIVAROXABAN 15 MG PO TABS
15.0000 mg | ORAL_TABLET | Freq: Every day | ORAL | Status: DC
  Administered 2022-04-15 – 2022-04-19 (×6): 15 mg via ORAL
  Filled 2022-04-15 (×7): qty 1

## 2022-04-15 MED ORDER — TRAMADOL HCL 50 MG PO TABS: 50.0000 mg | ORAL_TABLET | Freq: Four times a day (QID) | ORAL | Status: DC | PRN

## 2022-04-15 MED ORDER — NOREPINEPHRINE 4 MG/250ML-% IV SOLN: 0.0000 ug/min | INTRAVENOUS | Status: DC

## 2022-04-15 MED ORDER — INSULIN GLARGINE 100 UNIT/ML ~~LOC~~ SOLN
12.0000 [IU] | Freq: Every day | SUBCUTANEOUS | Status: DC
  Administered 2022-04-15 – 2022-04-18 (×4): 12 [IU] via SUBCUTANEOUS
  Filled 2022-04-15 (×5): qty 0.12

## 2022-04-15 MED ORDER — ACETAMINOPHEN 325 MG PO TABS: 650.0000 mg | ORAL_TABLET | Freq: Four times a day (QID) | ORAL | Status: DC | PRN

## 2022-04-15 NOTE — ED Notes (Signed)
Assumed care of patient. Patient is alert and oriented to name and place. Occasionally gets  confused about the time. Patient denies pain at this time. Patient states she has a history of Afib and takes eliquis for it.

## 2022-04-15 NOTE — ED Notes (Signed)
Patient was given some water to drink. Patient is resting in bed. [place]bell is within reach.

## 2022-04-15 NOTE — Consult Note (Signed)
Cardiology Consultation   Patient ID: Tara Bradley MRN: [id number]; DOB: [birth year]  Admit date: 04/14/2022 Date of Consult: 04/15/2022  PCP:  Tara Bradley, [facility] Cardiologist:  Tara Breeding, MD        Patient Profile:   Tara Bradley is a 88 y.o. female with a hx of permanent atrial fibrillation on Xarelto, aortic valve sclerosis, hypertension, hyperlipidemia, chronic diastolic heart failure and hx of aortic thrombosed descending aortic dissection who is being seen 04/15/2022 for the evaluation of atrial fibrillation at the request of Dr. Marlowe Bradley.  History of Present Illness:   [name] presented with abdominal pain.  Patient is transferred from her facility reporting increasing lower abdominal pain and distention in the last couple days with diarrhea, nausea, and decreased po intake. She was found to have pyelonephritis. Denies fever, chills, dizziness, syncope, cough, chest discomfort, heart palpitations, dyspnea, abdominal fullness, dysuria, diarrhea, pedal edema or any bleeding events.   Patient was found to have atrial fibrillation with RVR (130-140s). She was started IV diltiazem drip (up to 63m/h) in the ED. Her systolic blood pressure was dropped below 90 and was received IV bolus.  Currently, she is on room air, no acute distress, reporting feeling better in terms of her abdominal distension.  Past Medical History:  Diagnosis Date   Aortic valve sclerosis    Echo, 2008   Arthritis    Bradycardia    October, 2012   Cancer ([facility]    Chronic atrial fibrillation (HCC)    Chronic diastolic CH[facility] (congestive heart failure) ([facility]    Chronic insomnia    Closed fracture of unspecified part of femur 2005   Diabetes mellitus, type 2 ([facility]    Diverticulitis    Diverticulosis    Hyperlipidemia    Hypertension    Long term (current) use of anticoagulants    Osteopenia    Osteoporosis    femur fracture 2005, pelvic fracture 2006    Personal history of malignant neoplasm of breast    Pneumonia    Syncope    Thoracic aortic aneurysm ([facility] 02/2017   chronic descending aorta dissection   Unspecified closed fracture of pelvis 2006    Past Surgical History:  Procedure Laterality Date   CARDIOVERSION N/A 08/09/2012   Procedure: CARDIOVERSION;  Surgeon: SDeboraha Sprang MD;  [place] [facility]  Service: Cardiovascular;  Laterality: N/A;   EYE SURGERY     FEMUR SURGERY  2005   ORIF   INTRAMEDULLARY (IM) NAIL INTERTROCHANTERIC Left 08/02/2021   Procedure: INTRAMEDULLARY (IM) NAIL INTERTROCHANTRIC;  Surgeon: MWillaim Sheng MD;  [place] [facility]  Service: Orthopedics;  Laterality: Left;   IR ANGIOGRAM PELVIS SELECTIVE OR SUPRASELECTIVE  03/11/2017   IR ANGIOGRAM SELECTIVE EACH ADDITIONAL VESSEL  03/11/2017   IR EMBO ART  VEN HEMORR LYMPH EXTRAV  INC GUIDE ROADMAPPING  03/11/2017   IR FLUORO GUIDE CV LINE RIGHT  03/11/2017   IR [place] VASC ACCESS RIGHT  03/11/2017   IR [place] VASC ACCESS RIGHT  03/11/2017   MASTECTOMY  1995   left   MINOR HARDWARE REMOVAL Left 09/01/2020   Procedure: REMOVAL OF HARDWARE LEFT FOREARM;  Surgeon: TMilly Jakob MD;  [place] [facility] [facility]  Service: Orthopedics;  Laterality: Left;   ORIF ULNAR FRACTURE Left 10/19/2019   Procedure: OPEN REDUCTION INTERNAL FIXATION (ORIF)  LEFT DISTAL RADIUS  AND ULNA FRACTURE;  Surgeon: TMilly Jakob MD;  [place] [facility]  Service: Orthopedics;  Laterality: Left;   REVERSE SHOULDER ARTHROPLASTY Right 05/04/2021   Procedure: REVERSE SHOULDER ARTHROPLASTY;  Surgeon: Netta Cedars, MD;  Location: [place];  Service: Orthopedics;  Laterality: Right;   SHOULDER SURGERY     left   SHOULDER SURGERY  1979, 2003   TONSILLECTOMY     TOTAL ABDOMINAL HYSTERECTOMY W/ BILATERAL SALPINGOOPHORECTOMY  1995   WRIST SURGERY      x 2        Inpatient Medications: Scheduled Meds:  insulin aspart  0-5 Units Subcutaneous QHS   insulin aspart  0-9  Units Subcutaneous TID WC   Insulin Glargine Solostar  12 Units Subcutaneous QHS   Rivaroxaban  15 mg Oral Q supper   tamsulosin  0.4 mg Oral Daily   Continuous Infusions:  cefTRIAXone (ROCEPHIN)  IV     lactated ringers 100 mL/hr at 04/15/22 0109   PRN Meds: acetaminophen **OR** acetaminophen, traMADol  Allergies:    Allergies  Allergen Reactions   Ambien [Zolpidem] Other (See Comments)    Hallucinations- "Allergic," per MAR   Codeine Other (See Comments)    Headache  and  "Allergic," per MAR    Dulaglutide Diarrhea and Other (See Comments)     "Allergic," per MAR   Latex Hives   Morphine Sulfate Nausea And Vomiting and Other (See Comments)     "Allergic," per MAR   Mirtazapine Palpitations and Other (See Comments)     "Allergic," per [facility]    Social History:   Social History   Socioeconomic History   Marital status: Widowed    Spouse name: Not on file   Number of children: 2   Years of education: Not on file   Highest education level: Not on file  Occupational History   Occupation: English as a second language teacher, retired  Tobacco Use   Smoking status: Never   Smokeless tobacco: Never  Vaping Use   Vaping Use: Never used  Substance and Sexual Activity   Alcohol use: No   Drug use: No   Sexual activity: Not Currently  Other Topics Concern   Not on file  Social History Narrative   Husband had advancing Dementia, which was been very difficult for her. He died 2017-01-06.   She was scheduled to go into [facility] 11/02.   Social Determinants of Health   Financial Resource Strain: Not on file  Food Insecurity: No Food Insecurity (03/19/2022)   Hunger Vital Sign    Worried About Running Out of Food in the Last Year: Never true    Ran Out of Food in the Last Year: Never true  Transportation Needs: No Transportation Needs (03/19/2022)   PRAPARE - Hydrologist (Medical): No    Lack of Transportation (Non-Medical): No  Physical  Activity: Not on file  Stress: Not on file  Social Connections: Not on file  Intimate Partner Violence: Not At Risk (03/19/2022)   Humiliation, Afraid, Rape, and Kick questionnaire    Fear of Current or Ex-Partner: No    Emotionally Abused: No    Physically Abused: No    Sexually Abused: No    Family History:    Family History  Problem Relation Age of Onset   Congestive Heart Failure Mother 36   Heart attack Father 85   Diabetes type II Father    Diabetes type II Brother        1/2   Multiple myeloma Brother        2/2   Diabetes  type II Son    Colon cancer Neg Hx      ROS:  Please see the history of present illness.   All other ROS reviewed and negative.     Physical Exam/Data:   Vitals:   04/15/22 0045 04/15/22 0100 04/15/22 0115 04/15/22 0130  BP: 100/69 (!) 88/72 94/71 103/73  Pulse: (!) 133 (!) 128 85 (!) 152  Resp: (!) 23 (!) 22 20 (!) 21  Temp:      TempSrc:      SpO2: 95% 97% 98% 97%  Weight:      Height:        Intake/Output Summary (Last 24 hours) at 04/15/2022 0235 Last data filed at 04/14/2022 2221 Gross per 24 hour  Intake 1100.64 ml  Output 3200 ml  Net -2099.36 ml      04/14/2022    4:48 PM 03/19/2022   12:00 AM 02/15/2022   11:08 AM  Last 3 Weights  Weight (lbs) 144 lb 148 lb 13 oz 143 lb 12.8 oz  Weight (kg) 65.318 kg 67.5 kg 65.227 kg     Body mass index is 23.6 kg/m.  General:  Well nourished, well developed, in no acute distress HEENT: normal Neck: no JVD Vascular: No carotid bruits; Distal pulses 2+ bilaterally Cardiac: Irregularly irregular rhythm, tachycardia,  no murmur  Lungs:  clear to auscultation bilaterally, no wheezing, rhonchi or rales  Abd: soft, generalized abdominal mild tenderness without rebound tenderness, no hepatomegaly  Ext: no edema Musculoskeletal:  RLE ankle with boot Skin: warm and dry  Neuro:  CNs 2-12 intact, no focal abnormalities noted Psych:  Normal affect    Relevant CV  Studies:   Laboratory Data:  High Sensitivity Troponin:  No results for input(s): "TROPONINIHS" in the last 720 hours.   Chemistry Recent Labs  Lab 04/14/22 1712  NA 132*  K 4.7  CL 92*  CO2 28  GLUCOSE 70  BUN 49*  CREATININE 1.43*  CALCIUM 8.5*  MG 2.1  GFRNONAA 33*  ANIONGAP 12    Recent Labs  Lab 04/14/22 1712  PROT 5.7*  ALBUMIN 2.5*  AST 16  ALT 7  ALKPHOS 97  BILITOT 0.5   Lipids No results for input(s): "CHOL", "TRIG", "HDL", "LABVLDL", "LDLCALC", "CHOLHDL" in the last 168 hours.  Hematology Recent Labs  Lab 04/14/22 1712  WBC 8.0  RBC 3.27*  HGB 9.6*  HCT 30.3*  MCV 92.7  MCH 29.4  MCHC 31.7  RDW 16.0*  PLT 298   Thyroid No results for input(s): "TSH", "FREET4" in the last 168 hours.  BNPNo results for input(s): "BNP", "PROBNP" in the last 168 hours.  DDimer No results for input(s): "DDIMER" in the last 168 hours.   Radiology/Studies:  CT ABDOMEN PELVIS W CONTRAST  Result Date: 04/14/2022 CLINICAL DATA:  Abdominal pain, urinary retention EXAM: CT ABDOMEN AND PELVIS WITH CONTRAST TECHNIQUE: Multidetector CT imaging of the abdomen and pelvis was performed using the standard protocol following bolus administration of intravenous contrast. RADIATION DOSE REDUCTION: This exam was performed according to the departmental dose-optimization program which includes automated exposure control, adjustment of the mA and/or kV according to patient size and/or use of iterative reconstruction technique. CONTRAST:  79m OMNIPAQUE IOHEXOL 350 MG/ML SOLN COMPARISON:  09/28/2016 FINDINGS: Lower chest: Small bilateral pleural effusions. Compressive atelectasis in the lower lobes. Diffuse low-density throughout the liver suggesting fatty infiltration. Peripheral cyst in the right hepatic lobe measures 2 cm. Small layering gallstones within the gallbladder. Hepatobiliary: No focal abnormality  or ductal dilatation. Pancreas: No focal abnormality.  Normal size. Spleen: No focal  abnormality.  Normal size. Adrenals/Urinary Tract: Numerous bilateral small renal cysts, the largest in the right midpole measuring 16 mm. No follow-up imaging recommended. No hydronephrosis. Areas of decreased enhancement with striations in the right kidney compatible with pyelonephritis. Adrenal glands unremarkable. Foley catheter present in the bladder. Stomach/Bowel: Few scattered sigmoid diverticula. No active diverticulitis. Stomach and small bowel decompressed, grossly unremarkable. Vascular/Lymphatic: Aortic atherosclerosis. No evidence of aneurysm or adenopathy. Reproductive: Prior hysterectomy.  No adnexal masses. Other: Trace free fluid in the pelvis.  No free air. Musculoskeletal: No acute bony abnormality. IMPRESSION: Areas of decreased enhancement and striations within the right kidney compatible with pyelonephritis. Cholelithiasis. Aortic atherosclerosis. Electronically Signed   By: Rolm Baptise M.D.   On: 04/14/2022 21:14     Assessment and Plan:   #Permanent atrial fibrillation -patient remains asymptomatic from arrhythmia standpoint -permissive tachycardia in the setting of acute illness (pyelonephritis) with HR (<130s) -wean off diltizem drip given her BP was dropped, resume her home rate control medications. Can use short-term metoprolol tartrate or digoxin (patient has hx of bradycardia on digoxin) if extra rate control is needed -continue xarelto if there is no contraindication.   Risk Assessment/Risk Scores:     CHA2DS2-VASc Score = 7   This indicates a 11.2% annual risk of stroke. The patient's score is based upon: CHF History: 1 HTN History: 1 Diabetes History: 1 Stroke History: 0 Vascular Disease History: 1 Age Score: 2 Gender Score: 1   For questions or updates, please contact [facility] Please consult [email or web address] for contact info under    Signed, Laurice Record, MD  04/15/2022 2:35 AM

## 2022-04-15 NOTE — Progress Notes (Signed)
Progress Note  Patient Name: Tara Bradley Date of Encounter: 04/15/2022  Primary Cardiologist:   [name], MD   Subjective   No pain or SOB on O2 currently.  She does not feel her atrial fib  Inpatient Medications    Scheduled Meds:  diltiazem  480 mg Oral Daily   ferrous gluconate  324 mg Oral BID WC   insulin aspart  0-5 Units Subcutaneous QHS   insulin aspart  0-9 Units Subcutaneous TID WC   insulin glargine  12 Units Subcutaneous QHS   metoprolol succinate  100 mg Oral q morning   polyethylene glycol  17 g Oral Daily   Rivaroxaban  15 mg Oral Q supper   senna  1 tablet Oral BID   tamsulosin  0.4 mg Oral Daily   Continuous Infusions:  cefTRIAXone (ROCEPHIN)  IV     diltiazem (CARDIZEM) infusion 15 mg/hr (04/15/22 0734)   lactated ringers 100 mL/hr at 04/15/22 0109   PRN Meds: acetaminophen **OR** acetaminophen, melatonin   Vital Signs    Vitals:   04/15/22 0600 04/15/22 0630 04/15/22 0700 04/15/22 0730  BP: (!) 92/53 1'03/80 99/66 96/72 '$  Pulse: (!) 120 (!) 146 (!) 144 80  Resp: '17 17 19 18  '$ Temp:    97.7 F (36.5 C)  TempSrc:    Oral  SpO2: 98% 96% 98% 100%  Weight:      Height:        Intake/Output Summary (Last 24 hours) at 04/15/2022 0802 Last data filed at 04/14/2022 2221 Gross per 24 hour  Intake 1100.64 ml  Output 3200 ml  Net -2099.36 ml   Filed Weights   04/14/22 1648  Weight: 65.3 kg    Telemetry    Atrial fib, with rapid rate at times - Personally Reviewed  ECG    NA - Personally Reviewed  Physical Exam   GEN: No acute distress.    Neck: No  JVD Cardiac: Irregular RR, no murmurs, rubs, or gallops.  Respiratory:   Decreased breath sounds with basilar crackles.   GI: Soft, nontender, non-distended  MS: No  edema; No deformity. Neuro:  Nonfocal  Psych: Normal affect   Labs    Chemistry Recent Labs  Lab 04/14/22 1712 04/15/22 0327  NA 132* 132*  K 4.7 3.6  CL 92* 90*  CO2 28 29  GLUCOSE 70 92  BUN 49*  41*  CREATININE 1.43* 1.22*  CALCIUM 8.5* 8.7*  PROT 5.7*  --   ALBUMIN 2.5*  --   AST 16  --   ALT 7  --   ALKPHOS 97  --   BILITOT 0.5  --   GFRNONAA 33* 40*  ANIONGAP 12 13     Hematology Recent Labs  Lab 04/14/22 1712 04/15/22 0327  WBC 8.0 10.3  RBC 3.27* 3.14*  HGB 9.6* 9.4*  HCT 30.3* 28.8*  MCV 92.7 91.7  MCH 29.4 29.9  MCHC 31.7 32.6  RDW 16.0* 16.0*  PLT 298 293    Cardiac EnzymesNo results for input(s): "TROPONINI" in the last 168 hours. No results for input(s): "TROPIPOC" in the last 168 hours.   BNP Recent Labs  Lab 04/15/22 0327  BNP 750.3*     DDimer No results for input(s): "DDIMER" in the last 168 hours.   Radiology    CT ABDOMEN PELVIS W CONTRAST  Result Date: 04/14/2022 CLINICAL DATA:  Abdominal pain, urinary retention EXAM: CT ABDOMEN AND PELVIS WITH CONTRAST TECHNIQUE: Multidetector CT imaging of  the abdomen and pelvis was performed using the standard protocol following bolus administration of intravenous contrast. RADIATION DOSE REDUCTION: This exam was performed according to the departmental dose-optimization program which includes automated exposure control, adjustment of the mA and/or kV according to patient size and/or use of iterative reconstruction technique. CONTRAST:  41m OMNIPAQUE IOHEXOL 350 MG/ML SOLN COMPARISON:  09/28/2016 FINDINGS: Lower chest: Small bilateral pleural effusions. Compressive atelectasis in the lower lobes. Diffuse low-density throughout the liver suggesting fatty infiltration. Peripheral cyst in the right hepatic lobe measures 2 cm. Small layering gallstones within the gallbladder. Hepatobiliary: No focal abnormality or ductal dilatation. Pancreas: No focal abnormality.  Normal size. Spleen: No focal abnormality.  Normal size. Adrenals/Urinary Tract: Numerous bilateral small renal cysts, the largest in the right midpole measuring 16 mm. No follow-up imaging recommended. No hydronephrosis. Areas of decreased enhancement  with striations in the right kidney compatible with pyelonephritis. Adrenal glands unremarkable. Foley catheter present in the bladder. Stomach/Bowel: Few scattered sigmoid diverticula. No active diverticulitis. Stomach and small bowel decompressed, grossly unremarkable. Vascular/Lymphatic: Aortic atherosclerosis. No evidence of aneurysm or adenopathy. Reproductive: Prior hysterectomy.  No adnexal masses. Other: Trace free fluid in the pelvis.  No free air. Musculoskeletal: No acute bony abnormality. IMPRESSION: Areas of decreased enhancement and striations within the right kidney compatible with pyelonephritis. Cholelithiasis. Aortic atherosclerosis. Electronically Signed   By: KRolm BaptiseM.D.   On: 04/14/2022 21:14    Cardiac Studies   NA  Patient Profile     88y.o. female  with a hx of permanent atrial fibrillation on Xarelto, aortic valve sclerosis, hypertension, hyperlipidemia, chronic diastolic heart failure and hx of aortic thrombosed descending aortic dissection who is being seen 04/15/2022 for the evaluation of atrial fibrillation at the request of Dr. RMarlowe Sax   Assessment & Plan     Acute on chronic diastolic HF:   Likely some volume overload after hydration.  No further hydration.  Follow intake and output.    Atrial fib with RVR:  Permanent fib.  Rate is labile.  Continue Xarelto.  OK to continue current therapy including IV Dilt until acute issues have improved somewhat.  BP is low and as we wean the IV Dilt likely tomorrow we could start a low dose of PO digoxin.    I have not used this in the past for this patient.    For questions or updates, please contact [place] consult [email or web address] for contact info under Cardiology/STEMI.   Signed, JMinus Breeding MD  04/15/2022, 8:02 AM

## 2022-04-15 NOTE — Progress Notes (Signed)
PROGRESS NOTE    Tara Bradley  SHF:[id number] DOB: 1923/04/10 DOA: 04/14/2022 PCP: [name], MD   Brief Narrative:  HPI: Tara Bradley is a 88 y.o. female with medical history significant of chronic A-fib on Xarelto, insulin-dependent type 2 diabetes, hyperlipidemia, hypertension, chronic diastolic CHF, chronic hypoxemic respiratory failure on 4 L O2 as needed, history of breast cancer, aortic aneurysm with chronic descending dissection.  Admitted last month 10/26-10/30 for bimalleolar fracture of right ankle managed nonoperatively as patient refused surgical intervention.  Patient returns to the ED via EMS from her nursing facility for evaluation of abdominal pain/distention and vomiting in the setting of nonbloody diarrhea for the past few weeks.  Noted to be in A-fib with rate in the 140s.  Hypotensive with systolic as low as 50Y, improved after fluid boluses.  Labs showing no leukocytosis, hemoglobin 9.6 (no significant change from baseline), sodium 132, chloride 92, BUN 49, creatinine 1.4 (baseline 0.8-1.0), calcium 8.5, albumin 2.5, lipase and LFTs normal, lactic acid normal, magnesium normal. Patient also noted to have acute urinary retention with bladder scan showing 2.3 L and Foley catheter was placed.  UA with large amount of leukocytes and microscopy showing >50 WBCs and many bacteria.  Urine culture pending.  CT abdomen pelvis showing findings compatible with right-sided pyelonephritis. Patient was given ceftriaxone, 2 L LR boluses, metoprolol, and started on Cardizem infusion.  [facility] called to admit.   Patient states she was constipated a few weeks ago and was given laxatives at her facility and since then having ongoing diarrhea.  She is also endorsing right-sided abdominal pain and was vomiting earlier at her facility.  Denies any nausea at present and was able to tolerate p.o. intake in the ED.  Denies flank pain but endorsing dysuria for the past 2 weeks.  Denies fevers, cough,  shortness of breath, or chest pain.  Assessment & Plan:   Principal Problem:   Acute pyelonephritis Active Problems:   Bimalleolar fracture of right ankle   Chronic atrial fibrillation with RVR (HCC)   Chronic hypoxemic respiratory failure (HCC)   Chronic congestive heart failure with left ventricular diastolic dysfunction (HCC)   Acute urinary retention   AKI (acute kidney injury) ([place])   Diarrhea   QT prolongation   Insulin dependent type 2 diabetes mellitus (HCC)   Chronic anemia  Acute pyelonephritis: CT showing findings compatible with right-sided pyelonephritis.  No fever, leukocytosis, or lactic acidosis.  Continue Rocephin and follow culture.  Chronic A-fib with RVR Likely precipitated by infection and dehydration in setting of vomiting/ ongoing diarrhea.  Patient received metoprolol in the ED and was started on Cardizem infusion.  Received 2 L LR boluses due to hypotension.  Currently on maximum dose Cardizem infusion and rate continues to be uncontrolled fluctuating around 120s, Potassium and magnesium within normal range.  Cardiology following, they recommend continuing current dose of diltiazem until tomorrow and then likely transition to oral digoxin.  Continue Xarelto.  TSH normal.  Her blood pressure is on the low normal side, I will continue current RL but reduce the rate from 100 cc/h to 50 cc/h in order to reduce the risk of acute pulmonary edema in a patient who is 87 year old with uncontrolled A-fib.   AKI Acute urinary retention BUN 49, creatinine 1.4 (baseline 0.8-1.0).  Bladder scan done in the ED showing 2.3 L and Foley catheter was placed.  This is likely due to acute infection as well.  Creatinine improving.  Continue home Flomax.  Avoid  nephrotoxic agents.  Continue to hold diuretics.  Continue IV fluids however I will reduce the rate.   Diarrhea No evidence of colitis on CT.  No fever or leukocytosis. -C. difficile PCR and GI pathogen panel, enteric  precautions, patient has not had any bowel movement since admission.   QT prolongation Potassium and magnesium within normal range. -Cardiac monitoring -Avoid QT prolonging drugs if possible   Insulin-dependent type 2 diabetes Last A1c 8.6 in December 2022. -Repeat A1c -Continue home basal insulin -Sensitive sliding scale insulin ACHS   Chronic diastolic CHF Appears euvolemic. -Hold Lasix and spironolactone given AKI   Chronic hypoxemic respiratory failure Uses 2 L of oxygen chronically at home, no change in oxygen requirement from baseline. -Continue to monitor   Chronic anemia Hemoglobin 9.6, no significant change from baseline. -Continue to monitor   Recent bimalleolar fracture of right ankle Patient had declined surgery and was managed nonoperatively.  Currently in a splint. -Continue pain management -Outpatient orthopedic follow-up  DVT prophylaxis: Xarelto   Code Status: DNR  Family Communication:  None present at bedside.  Plan of care discussed with patient in length and he/she verbalized understanding and agreed with it.  Status is: Inpatient Remains inpatient appropriate because: On Cardizem drip for uncontrolled A-fib   Estimated body mass index is 23.6 kg/m as calculated from the following:   Height as of this encounter: 5' 5.5" (1.664 m).   Weight as of this encounter: 65.3 kg.    Nutritional Assessment: Body mass index is 23.6 kg/m.[name] Seen by dietician.  I agree with the assessment and plan as outlined below: Nutrition Status:        . Skin Assessment: I have examined the patient's skin and I agree with the wound assessment as performed by the wound care RN as outlined below:    Consultants:  Cardiology  Procedures:  None  Antimicrobials:  Anti-infectives (From admission, onward)    Start     Dose/Rate Route Frequency Ordered Stop   04/15/22 1800  cefTRIAXone (ROCEPHIN) 1 g in sodium chloride 0.9 % 100 mL IVPB        1 g 200 mL/hr  over 30 Minutes Intravenous Every 24 hours 04/15/22 0226     04/14/22 1900  cefTRIAXone (ROCEPHIN) 1 g in sodium chloride 0.9 % 100 mL IVPB        1 g 200 mL/hr over 30 Minutes Intravenous  Once 04/14/22 1853 04/14/22 2000         Subjective: Seen and examined.  She feels better than yesterday.  No specific complaint.  Denied any chest pain or shortness of breath.  Objective: Vitals:   04/15/22 0600 04/15/22 0630 04/15/22 0700 04/15/22 0730  BP: (!) 92/53 1'03/80 99/66 96/72 '$  Pulse: (!) 120 (!) 146 (!) 144 80  Resp: '17 17 19 18  '$ Temp:    97.7 F (36.5 C)  TempSrc:    Oral  SpO2: 98% 96% 98% 100%  Weight:      Height:        Intake/Output Summary (Last 24 hours) at 04/15/2022 0758 Last data filed at 04/14/2022 2221 Gross per 24 hour  Intake 1100.64 ml  Output 3200 ml  Net -2099.36 ml   Filed Weights   04/14/22 1648  Weight: 65.3 kg    Examination:  General exam: Appears calm and comfortable  Respiratory system: Clear to auscultation. Respiratory effort normal. Cardiovascular system: S1 & S2 heard, irregularly irregular rate and rhythm. No JVD, murmurs, rubs, gallops or  clicks. No pedal edema. Gastrointestinal system: Abdomen is nondistended, soft and nontender. No organomegaly or masses felt. Normal bowel sounds heard. Central nervous system: Alert and oriented. No focal neurological deficits. Extremities: Symmetric 5 x 5 power. Skin: No rashes, lesions or ulcers Psychiatry: Judgement and insight appear normal. Mood & affect appropriate.    Data Reviewed: I have personally reviewed following labs and imaging studies  CBC: Recent Labs  Lab 04/14/22 1712 04/15/22 0327  WBC 8.0 10.3  NEUTROABS 6.6  --   HGB 9.6* 9.4*  HCT 30.3* 28.8*  MCV 92.7 91.7  PLT 298 030   Basic Metabolic Panel: Recent Labs  Lab 04/14/22 1712 04/15/22 0327  NA 132* 132*  K 4.7 3.6  CL 92* 90*  CO2 28 29  GLUCOSE 70 92  BUN 49* 41*  CREATININE 1.43* 1.22*  CALCIUM 8.5* 8.7*   MG 2.1  --    GFR: Estimated Creatinine Clearance: 24.2 mL/min (A) (by C-G formula based on SCr of 1.22 mg/dL (H)). Liver Function Tests: Recent Labs  Lab 04/14/22 1712  AST 16  ALT 7  ALKPHOS 97  BILITOT 0.5  PROT 5.7*  ALBUMIN 2.5*   Recent Labs  Lab 04/14/22 1712  LIPASE 28   No results for input(s): "AMMONIA" in the last 168 hours. Coagulation Profile: Recent Labs  Lab 04/14/22 1712  INR 1.3*   Cardiac Enzymes: No results for input(s): "CKTOTAL", "CKMB", "CKMBINDEX", "TROPONINI" in the last 168 hours. BNP (last 3 results) No results for input(s): "PROBNP" in the last 8760 hours. HbA1C: No results for input(s): "HGBA1C" in the last 72 hours. CBG: Recent Labs  Lab 04/15/22 0743  GLUCAP 85   Lipid Profile: No results for input(s): "CHOL", "HDL", "LDLCALC", "TRIG", "CHOLHDL", "LDLDIRECT" in the last 72 hours. Thyroid Function Tests: No results for input(s): "TSH", "T4TOTAL", "FREET4", "T3FREE", "THYROIDAB" in the last 72 hours. Anemia Panel: No results for input(s): "VITAMINB12", "FOLATE", "FERRITIN", "TIBC", "IRON", "RETICCTPCT" in the last 72 hours. Sepsis Labs: Recent Labs  Lab 04/14/22 1712 04/15/22 0327  LATICACIDVEN 0.9 1.0    No results found for this or any previous visit (from the past 240 hour(s)).   Radiology Studies: CT ABDOMEN PELVIS W CONTRAST  Result Date: 04/14/2022 CLINICAL DATA:  Abdominal pain, urinary retention EXAM: CT ABDOMEN AND PELVIS WITH CONTRAST TECHNIQUE: Multidetector CT imaging of the abdomen and pelvis was performed using the standard protocol following bolus administration of intravenous contrast. RADIATION DOSE REDUCTION: This exam was performed according to the departmental dose-optimization program which includes automated exposure control, adjustment of the mA and/or kV according to patient size and/or use of iterative reconstruction technique. CONTRAST:  57m OMNIPAQUE IOHEXOL 350 MG/ML SOLN COMPARISON:  09/28/2016  FINDINGS: Lower chest: Small bilateral pleural effusions. Compressive atelectasis in the lower lobes. Diffuse low-density throughout the liver suggesting fatty infiltration. Peripheral cyst in the right hepatic lobe measures 2 cm. Small layering gallstones within the gallbladder. Hepatobiliary: No focal abnormality or ductal dilatation. Pancreas: No focal abnormality.  Normal size. Spleen: No focal abnormality.  Normal size. Adrenals/Urinary Tract: Numerous bilateral small renal cysts, the largest in the right midpole measuring 16 mm. No follow-up imaging recommended. No hydronephrosis. Areas of decreased enhancement with striations in the right kidney compatible with pyelonephritis. Adrenal glands unremarkable. Foley catheter present in the bladder. Stomach/Bowel: Few scattered sigmoid diverticula. No active diverticulitis. Stomach and small bowel decompressed, grossly unremarkable. Vascular/Lymphatic: Aortic atherosclerosis. No evidence of aneurysm or adenopathy. Reproductive: Prior hysterectomy.  No adnexal masses. Other: Trace  free fluid in the pelvis.  No free air. Musculoskeletal: No acute bony abnormality. IMPRESSION: Areas of decreased enhancement and striations within the right kidney compatible with pyelonephritis. Cholelithiasis. Aortic atherosclerosis. Electronically Signed   By: Rolm Baptise M.D.   On: 04/14/2022 21:14    Scheduled Meds:  diltiazem  480 mg Oral Daily   ferrous gluconate  324 mg Oral BID WC   insulin aspart  0-5 Units Subcutaneous QHS   insulin aspart  0-9 Units Subcutaneous TID WC   insulin glargine  12 Units Subcutaneous QHS   metoprolol succinate  100 mg Oral q morning   polyethylene glycol  17 g Oral Daily   Rivaroxaban  15 mg Oral Q supper   senna  1 tablet Oral BID   tamsulosin  0.4 mg Oral Daily   Continuous Infusions:  cefTRIAXone (ROCEPHIN)  IV     diltiazem (CARDIZEM) infusion 15 mg/hr (04/15/22 0734)   lactated ringers 100 mL/hr at 04/15/22 0109     LOS: 0  days   Darliss Cheney, MD [facility]  04/15/2022, 7:58 AM   *Please note that this is a verbal dictation therefore any spelling or grammatical errors are due to the "[place] [employer]" system interpretation.  Please page via [place] and do not message via secure chat for urgent patient care matters. Secure chat can be used for non urgent patient care matters.  How to contact the [facility] Attending or Consulting provider [place] or covering provider during after hours [place], for this patient?  Check the care team in [facility] and look for a) attending/consulting [facility] provider listed and b) the [facility] team listed. Page or secure chat [phone]. Log into [email or web address] and use [place]'s universal password to access. If you do not have the password, please contact the hospital operator. Locate the [facility] provider you are looking for under [facility] and page to a number that you can be directly reached. If you still have difficulty reaching the provider, please page the [facility] (Director on Call) for the Hospitalists listed on amion for assistance.

## 2022-04-15 NOTE — H&P (Signed)
History and Physical    Tara Bradley DVV:[id number] DOB: [birth year] DOA: 04/14/2022  PCP: [name], MD  Patient coming from: [facility]  Chief Complaint: Abdominal pain and distention  HPI: Tara Bradley is a 88 y.o. female with medical history significant of chronic A-fib on Xarelto, insulin-dependent type 2 diabetes, hyperlipidemia, hypertension, chronic diastolic CHF, chronic hypoxemic respiratory failure on 4 L O2 as needed, history of breast cancer, aortic aneurysm with chronic descending dissection.  Admitted last month 10/26-10/30 for bimalleolar fracture of right ankle managed nonoperatively as patient refused surgical intervention.  Patient returns to the ED via EMS from her nursing facility for evaluation of abdominal pain/distention and vomiting in the setting of nonbloody diarrhea for the past few weeks.  Noted to be in A-fib with rate in the 140s.  Hypotensive with systolic as low as 62I, improved after fluid boluses.  Labs showing no leukocytosis, hemoglobin 9.6 (no significant change from baseline), sodium 132, chloride 92, BUN 49, creatinine 1.4 (baseline 0.8-1.0), calcium 8.5, albumin 2.5, lipase and LFTs normal, lactic acid normal, magnesium normal. Patient also noted to have acute urinary retention with bladder scan showing 2.3 L and Foley catheter was placed.  UA with large amount of leukocytes and microscopy showing >50 WBCs and many bacteria.  Urine culture pending.  CT abdomen pelvis showing findings compatible with right-sided pyelonephritis. Patient was given ceftriaxone, 2 L LR boluses, metoprolol, and started on Cardizem infusion.  TRH called to admit.  Patient states she was constipated a few weeks ago and was given laxatives at her facility and since then having ongoing diarrhea.  She is also endorsing right-sided abdominal pain and was vomiting earlier at her facility.  Denies any nausea at present and was able to tolerate p.o. intake in the ED.  Denies  flank pain but endorsing dysuria for the past 2 weeks.  Denies fevers, cough, shortness of breath, or chest pain.  Review of Systems:  Review of Systems  All other systems reviewed and are negative.   Past Medical History:  Diagnosis Date   Aortic valve sclerosis    Echo, 2008   Arthritis    Bradycardia    October, 2012   Cancer [facility])    Chronic atrial fibrillation (HCC)    Chronic diastolic CHF (congestive heart failure) ([place])    Chronic insomnia    Closed fracture of unspecified part of femur 2005   Diabetes mellitus, type 2 ([place])    Diverticulitis    Diverticulosis    Hyperlipidemia    Hypertension    Long term (current) use of anticoagulants    Osteopenia    Osteoporosis    femur fracture 2005, pelvic fracture 2006   Personal history of malignant neoplasm of breast    Pneumonia    Syncope    Thoracic aortic aneurysm ([place]) 02/2017   chronic descending aorta dissection   Unspecified closed fracture of pelvis 2006    Past Surgical History:  Procedure Laterality Date   CARDIOVERSION N/A 08/09/2012   Procedure: CARDIOVERSION;  Surgeon: Deboraha Sprang, MD;  Location: [place];  Service: Cardiovascular;  Laterality: N/A;   EYE SURGERY     FEMUR SURGERY  2005   ORIF   INTRAMEDULLARY (IM) NAIL INTERTROCHANTERIC Left 08/02/2021   Procedure: INTRAMEDULLARY (IM) NAIL INTERTROCHANTRIC;  Surgeon: Willaim Sheng, MD;  Location: [place];  Service: Orthopedics;  Laterality: Left;   IR ANGIOGRAM PELVIS SELECTIVE OR SUPRASELECTIVE  03/11/2017   IR ANGIOGRAM SELECTIVE EACH ADDITIONAL  VESSEL  03/11/2017   IR EMBO ART  VEN HEMORR LYMPH EXTRAV  INC GUIDE ROADMAPPING  03/11/2017   IR FLUORO GUIDE CV LINE RIGHT  03/11/2017   IR US GUIDE VASC ACCESS RIGHT  03/11/2017   IR US GUIDE VASC ACCESS RIGHT  03/11/2017   MASTECTOMY  1995   left   MINOR HARDWARE REMOVAL Left 09/01/2020   Procedure: REMOVAL OF HARDWARE LEFT FOREARM;  Surgeon: Milly Jakob, MD;  Location: [place];  Service: Orthopedics;  Laterality: Left;   ORIF ULNAR FRACTURE Left 10/19/2019   Procedure: OPEN REDUCTION INTERNAL FIXATION (ORIF)  LEFT DISTAL RADIUS  AND ULNA FRACTURE;  Surgeon: Milly Jakob, MD;  Location: [place];  Service: Orthopedics;  Laterality: Left;   REVERSE SHOULDER ARTHROPLASTY Right 05/04/2021   Procedure: REVERSE SHOULDER ARTHROPLASTY;  Surgeon: Netta Cedars, MD;  Location: [place];  Service: Orthopedics;  Laterality: Right;   SHOULDER SURGERY     left   SHOULDER SURGERY  1979, 2003   TONSILLECTOMY     TOTAL ABDOMINAL HYSTERECTOMY W/ BILATERAL SALPINGOOPHORECTOMY  1995   WRIST SURGERY      x 2      reports that she has never smoked. She has never used smokeless tobacco. She reports that she does not drink alcohol and does not use drugs.  Allergies  Allergen Reactions   Ambien [Zolpidem] Other (See Comments)    Hallucinations- "Allergic," per MAR   Codeine Other (See Comments)    Headache  and  "Allergic," per MAR    Dulaglutide Diarrhea and Other (See Comments)     "Allergic," per MAR   Latex Hives   Morphine Sulfate Nausea And Vomiting and Other (See Comments)     "Allergic," per MAR   Mirtazapine Palpitations and Other (See Comments)     "Allergic," per [facility]    Family History  Problem Relation Age of Onset   Congestive Heart Failure Mother 67   Heart attack Father 42   Diabetes type II Father    Diabetes type II Brother        1/2   Multiple myeloma Brother        2/2   Diabetes type II Son    Colon cancer Neg Hx     Prior to Admission medications   Medication Sig Start Date End Date Taking? Authorizing Provider  acetaminophen (TYLENOL) 325 MG tablet Take 650 mg by mouth every 6 (six) hours as needed (for pain).   Yes [provider]  bisacodyl (DULCOLAX) 10 MG suppository Place 10 mg rectally as needed for moderate constipation (if no relief from Milk of Magnesia).   Yes [provider]  Calcium Carb-Cholecalciferol  (CALCIUM 600+D3 PO) Take 1 tablet by mouth in the morning.   Yes [provider]  Cholecalciferol (VITAMIN D3) 125 MCG (5000 UT) CAPS Take 5,000 Units by mouth daily.   Yes [provider]  diltiazem (CARDIZEM CD) 240 MG 24 hr capsule Take 2 capsules (480 mg total) by mouth daily. 03/23/22 04/22/22 Yes Pahwani, Einar Grad, MD  ferrous gluconate (FERGON) 324 MG tablet Take 324 mg by mouth 2 (two) times daily with a meal.   Yes [provider]  [place] (PREVALON) MISC Apply 1 Device topically See admin instructions. Apply Prevalon boot to the left foot while in bed and may remove for AM/PM care   Yes [provider]  furosemide (LASIX) 80 MG tablet TAKE ONE TABLET TWICE DAILY AT 8am AND 9pm  FOR edema/chf Patient taking differently: Take 80 mg by mouth See admin instructions. Take 80 mg by mouth at 10 AM and 5 PM 12/22/21  Yes [name], MD  IMODIUM A-D 2 MG tablet Take 2 mg by mouth as needed for diarrhea or loose stools.   Yes [provider]  Insulin Glargine Solostar (LANTUS) 100 UNIT/ML Solostar Pen Inject 12 Units into the skin at bedtime.   Yes [provider]  magnesium hydroxide (MILK OF MAGNESIA) 400 MG/5ML suspension Take 30 mLs by mouth daily as needed for mild constipation.   Yes [provider]  metFORMIN (GLUCOPHAGE-XR) 500 MG 24 hr tablet Take 1,000 mg by mouth at bedtime.   Yes [provider]  metoprolol succinate (TOPROL-XL) 100 MG 24 hr tablet TAKE ONE TABLET BY MOUTH EVERY MORNING Patient taking differently: Take 100 mg by mouth daily. 03/10/22  Yes Hochrein, Jeneen Rinks, MD  NOVOLOG FLEXPEN 100 UNIT/ML FlexPen Inject 0-9 Units into the skin See admin instructions. Inject 0-9 units into the skin 3 times a day, per sliding scale: BGL 70-120 = give nothing; 121-150 = 1 unit; 151-200 = 2 units; 201-250 = 3 units; 251-300 = 5 units; 301-350 = 7 units; 351- 400 = 9 units; >400 = call MD   Yes [provider]   OXYGEN Inhale 2 L/min into the lungs as needed (to maintain sats of 90%).   Yes [provider]  polyethylene glycol (MIRALAX / GLYCOLAX) 17 g packet Take 17 g by mouth daily. 05/12/21  Yes Shelly Coss, MD  potassium chloride SA (KLOR-CON M) 20 MEQ tablet TAKE TWO TABLETS EVERY EVENING AT FIVE Patient taking differently: Take 40 mEq by mouth every evening. 12/22/21  Yes [name], MD  senna (SENOKOT) 8.6 MG TABS tablet Take 1 tablet (8.6 mg total) by mouth 2 (two) times daily. 08/07/21  Yes Shelly Coss, MD  Sodium Phosphates (ENEMA) ENEM Place 1 enema rectally daily as needed (for constipation not relieved by biscodyl suppository and contact MD for orders).   Yes [provider]  spironolactone (ALDACTONE) 25 MG tablet Take 12.5 mg by mouth daily.   Yes [provider]  tamsulosin (FLOMAX) 0.4 MG CAPS capsule TAKE ONE CAPSULE BY MOUTH IN THE MORNING Patient taking differently: Take 0.4 mg by mouth daily. 01/29/22  Yes [name], MD  traMADol (ULTRAM) 50 MG tablet Take 50 mg by mouth every 6 (six) hours as needed (for pain).   Yes [provider]  XARELTO 15 MG TABS tablet TAKE 1 TABLET ONCE DAILY WITH SUPPER. Patient taking differently: Take 15 mg by mouth daily with supper. 06/22/21  Yes [name], MD  BD PEN NEEDLE NANO U/F 32G X 4 MM MISC Inject into the skin daily. 11/25/20   [provider]  oxyCODONE (ROXICODONE) 5 MG immediate release tablet Take 0.5-1 tablets (2.5-5 mg total) by mouth every 4 (four) hours as needed for severe pain. Patient not taking: Reported on 04/14/2022 03/22/22   Callie Fielding, MD    Physical Exam: Vitals:   04/15/22 0040 04/15/22 0041 04/15/22 0045 04/15/22 0100  BP:   100/69 (!) 88/72  Pulse:   (!) 133 (!) 128  Resp: (!) 22 (!) 24 (!) 23 (!) 22  Temp:      TempSrc:      SpO2: 96%  95% 97%  Weight:      Height:        Physical Exam Vitals reviewed.  Constitutional:  General: She is  not in acute distress. HENT:     Head: Normocephalic and atraumatic.  Eyes:     Extraocular Movements: Extraocular movements intact.  Cardiovascular:     Rate and Rhythm: Normal rate and regular rhythm.     Pulses: Normal pulses.  Pulmonary:     Effort: Pulmonary effort is normal. No respiratory distress.     Breath sounds: Normal breath sounds. No wheezing or rales.  Abdominal:     General: Bowel sounds are normal. There is no distension.     Palpations: Abdomen is soft.     Tenderness: There is no abdominal tenderness. There is no guarding or rebound.  Musculoskeletal:     Cervical back: Normal range of motion.     Comments: Right ankle in splint  Skin:    General: Skin is warm and dry.  Neurological:     General: No focal deficit present.     Mental Status: She is alert and oriented to person, place, and time.     Labs on Admission: I have personally reviewed following labs and imaging studies  CBC: Recent Labs  Lab 04/14/22 1712  WBC 8.0  NEUTROABS 6.6  HGB 9.6*  HCT 30.3*  MCV 92.7  PLT 924   Basic Metabolic Panel: Recent Labs  Lab 04/14/22 1712  NA 132*  K 4.7  CL 92*  CO2 28  GLUCOSE 70  BUN 49*  CREATININE 1.43*  CALCIUM 8.5*  MG 2.1   GFR: Estimated Creatinine Clearance: 20.7 mL/min (A) (by C-G formula based on SCr of 1.43 mg/dL (H)). Liver Function Tests: Recent Labs  Lab 04/14/22 1712  AST 16  ALT 7  ALKPHOS 97  BILITOT 0.5  PROT 5.7*  ALBUMIN 2.5*   Recent Labs  Lab 04/14/22 1712  LIPASE 28   No results for input(s): "AMMONIA" in the last 168 hours. Coagulation Profile: Recent Labs  Lab 04/14/22 1712  INR 1.3*   Cardiac Enzymes: No results for input(s): "CKTOTAL", "CKMB", "CKMBINDEX", "TROPONINI" in the last 168 hours. BNP (last 3 results) No results for input(s): "PROBNP" in the last 8760 hours. HbA1C: No results for input(s): "HGBA1C" in the last 72 hours. CBG: No results for input(s): "GLUCAP" in the last 168  hours. Lipid Profile: No results for input(s): "CHOL", "HDL", "LDLCALC", "TRIG", "CHOLHDL", "LDLDIRECT" in the last 72 hours. Thyroid Function Tests: No results for input(s): "TSH", "T4TOTAL", "FREET4", "T3FREE", "THYROIDAB" in the last 72 hours. Anemia Panel: No results for input(s): "VITAMINB12", "FOLATE", "FERRITIN", "TIBC", "IRON", "RETICCTPCT" in the last 72 hours. Urine analysis:    Component Value Date/Time   COLORURINE YELLOW 04/14/2022 1736   APPEARANCEUR CLOUDY (A) 04/14/2022 1736   LABSPEC 1.006 04/14/2022 1736   PHURINE 8.0 04/14/2022 1736   GLUCOSEU NEGATIVE 04/14/2022 1736   HGBUR SMALL (A) 04/14/2022 1736   BILIRUBINUR NEGATIVE 04/14/2022 1736   BILIRUBINUR n 07/23/2013 1343   KETONESUR NEGATIVE 04/14/2022 1736   PROTEINUR 30 (A) 04/14/2022 1736   UROBILINOGEN 0.2 07/23/2013 1343   UROBILINOGEN 0.2 03/07/2012 2123   NITRITE NEGATIVE 04/14/2022 1736   LEUKOCYTESUR LARGE (A) 04/14/2022 1736    Radiological Exams on Admission: CT ABDOMEN PELVIS W CONTRAST  Result Date: 04/14/2022 CLINICAL DATA:  Abdominal pain, urinary retention EXAM: CT ABDOMEN AND PELVIS WITH CONTRAST TECHNIQUE: Multidetector CT imaging of the abdomen and pelvis was performed using the standard protocol following bolus administration of intravenous contrast. RADIATION DOSE REDUCTION: This exam was performed according to the departmental dose-optimization program which  includes automated exposure control, adjustment of the mA and/or kV according to patient size and/or use of iterative reconstruction technique. CONTRAST:  4m OMNIPAQUE IOHEXOL 350 MG/ML SOLN COMPARISON:  09/28/2016 FINDINGS: Lower chest: Small bilateral pleural effusions. Compressive atelectasis in the lower lobes. Diffuse low-density throughout the liver suggesting fatty infiltration. Peripheral cyst in the right hepatic lobe measures 2 cm. Small layering gallstones within the gallbladder. Hepatobiliary: No focal abnormality or ductal  dilatation. Pancreas: No focal abnormality.  Normal size. Spleen: No focal abnormality.  Normal size. Adrenals/Urinary Tract: Numerous bilateral small renal cysts, the largest in the right midpole measuring 16 mm. No follow-up imaging recommended. No hydronephrosis. Areas of decreased enhancement with striations in the right kidney compatible with pyelonephritis. Adrenal glands unremarkable. Foley catheter present in the bladder. Stomach/Bowel: Few scattered sigmoid diverticula. No active diverticulitis. Stomach and small bowel decompressed, grossly unremarkable. Vascular/Lymphatic: Aortic atherosclerosis. No evidence of aneurysm or adenopathy. Reproductive: Prior hysterectomy.  No adnexal masses. Other: Trace free fluid in the pelvis.  No free air. Musculoskeletal: No acute bony abnormality. IMPRESSION: Areas of decreased enhancement and striations within the right kidney compatible with pyelonephritis. Cholelithiasis. Aortic atherosclerosis. Electronically Signed   By: KRolm BaptiseM.D.   On: 04/14/2022 21:14    EKG: Independently reviewed.  A-fib with RVR, QTc 535.  T wave abnormality in lateral leads is not new.  Assessment and Plan  Acute pyelonephritis UA with large amount of leukocytes and microscopy showing >50 WBCs and many bacteria. CT showing findings compatible with right-sided pyelonephritis.  No fever, leukocytosis, or lactic acidosis. -Continue ceftriaxone -No blood cultures drawn in the ED -Urine culture pending -Trend lactate and WBC count  Chronic A-fib with RVR Likely precipitated by infection and dehydration in setting of vomiting/ ongoing diarrhea.  Patient received metoprolol in the ED and was started on Cardizem infusion.  Received 2 L LR boluses due to hypotension.  Currently on maximum dose Cardizem infusion and rate continues to be uncontrolled fluctuating between 120s to 150s.  Most recent blood pressure 90/65 (MAP 70).  Potassium and magnesium within normal range. -Cardiac  monitoring -Continue Cardizem infusion and IV fluid hydration -Continue Xarelto -Cardiology consulted, appreciate help -Check TSH  AKI Acute urinary retention BUN 49, creatinine 1.4 (baseline 0.8-1.0).  Bladder scan done in the ED showing 2.3 L and Foley catheter was placed. -IV fluid hydration -Continue Foley and monitor urine output -Continue home Flomax -Avoid nephrotoxic agents/hold home spironolactone -Monitor renal function  Diarrhea No evidence of colitis on CT.  No fever or leukocytosis. -C. difficile PCR and GI pathogen panel, enteric precautions  QT prolongation Potassium and magnesium within normal range. -Cardiac monitoring -Avoid QT prolonging drugs if possible -Repeat EKG in a.m.  Insulin-dependent type 2 diabetes Last A1c 8.6 in December 2022. -Repeat A1c -Continue home basal insulin -Sensitive sliding scale insulin ACHS  Chronic diastolic CHF CT abdomen pelvis showing small bilateral pleural effusions.  Does not appear volume overloaded on exam. -Check BNP -Hold spironolactone given AKI  Chronic hypoxemic respiratory failure No change in oxygen requirement from baseline. -Continue to monitor  Chronic anemia Hemoglobin 9.6, no significant change from baseline. -Continue to monitor  Recent bimalleolar fracture of right ankle Patient had declined surgery and was managed nonoperatively.  Currently in a splint. -Continue pain management -Outpatient orthopedic follow-up  DVT prophylaxis: Xarelto Code Status: DNR (discussed with the patient) Family Communication: No family available at this time. Consults called: Cardiology (Dr. YAlfred Levins Level of care: Progressive Care Unit Admission status: It  is my clinical opinion that referral for OBSERVATION is reasonable and necessary in this patient based on the above information provided. The aforementioned taken together are felt to place the patient at high risk for further clinical deterioration. However, it is  anticipated that the patient may be medically stable for discharge from the hospital within 24 to 48 hours.   Shela Leff MD [facility]  If 7PM-7AM, please contact night-coverage [email or web address]  04/15/2022, 1:21 AM

## 2022-04-15 NOTE — ED Notes (Signed)
Cardiology at bedside.

## 2022-04-16 ENCOUNTER — Inpatient Hospital Stay (HOSPITAL_COMMUNITY): Payer: Medicare Other

## 2022-04-16 DIAGNOSIS — I4891 Unspecified atrial fibrillation: Secondary | ICD-10-CM | POA: Diagnosis not present

## 2022-04-16 DIAGNOSIS — N10 Acute tubulo-interstitial nephritis: Secondary | ICD-10-CM | POA: Diagnosis not present

## 2022-04-16 DIAGNOSIS — I5032 Chronic diastolic (congestive) heart failure: Secondary | ICD-10-CM

## 2022-04-16 DIAGNOSIS — I482 Chronic atrial fibrillation, unspecified: Secondary | ICD-10-CM | POA: Diagnosis not present

## 2022-04-16 LAB — CBC WITH DIFFERENTIAL/PLATELET
Abs Immature Granulocytes: 0.11 10*3/uL — ABNORMAL HIGH (ref 0.00–0.07)
Basophils Absolute: 0.1 10*3/uL (ref 0.0–0.1)
Basophils Relative: 1 %
Eosinophils Absolute: 0.1 10*3/uL (ref 0.0–0.5)
Eosinophils Relative: 1 %
HCT: 28.5 % — ABNORMAL LOW (ref 36.0–46.0)
Hemoglobin: 9.4 g/dL — ABNORMAL LOW (ref 12.0–15.0)
Immature Granulocytes: 1 %
Lymphocytes Relative: 6 %
Lymphs Abs: 0.6 10*3/uL — ABNORMAL LOW (ref 0.7–4.0)
MCH: 30 pg (ref 26.0–34.0)
MCHC: 33 g/dL (ref 30.0–36.0)
MCV: 91.1 fL (ref 80.0–100.0)
Monocytes Absolute: 0.7 10*3/uL (ref 0.1–1.0)
Monocytes Relative: 7 %
Neutro Abs: 8.5 10*3/uL — ABNORMAL HIGH (ref 1.7–7.7)
Neutrophils Relative %: 84 %
Platelets: 306 10*3/uL (ref 150–400)
RBC: 3.13 MIL/uL — ABNORMAL LOW (ref 3.87–5.11)
RDW: 16.1 % — ABNORMAL HIGH (ref 11.5–15.5)
WBC: 10.1 10*3/uL (ref 4.0–10.5)
nRBC: 0 % (ref 0.0–0.2)

## 2022-04-16 LAB — GLUCOSE, CAPILLARY
Glucose-Capillary: 62 mg/dL — ABNORMAL LOW (ref 70–99)
Glucose-Capillary: 138 mg/dL — ABNORMAL HIGH (ref 70–99)
Glucose-Capillary: 121 mg/dL — ABNORMAL HIGH (ref 70–99)
Glucose-Capillary: 170 mg/dL — ABNORMAL HIGH (ref 70–99)
Glucose-Capillary: 177 mg/dL — ABNORMAL HIGH (ref 70–99)

## 2022-04-16 LAB — ECHOCARDIOGRAM COMPLETE
Calc EF: 51.6 %
Height: 65.5 in
MV M vel: 3.92 m/s
MV Peak grad: 61.5 mmHg
MV VTI: 1.96 cm2
Radius: 0.4 cm
S' Lateral: 2.8 cm
Single Plane A2C EF: 55.3 %
Single Plane A4C EF: 50.7 %
Weight: 2304 [oz_av]

## 2022-04-16 LAB — MAGNESIUM: Magnesium: 1.9 mg/dL (ref 1.7–2.4)

## 2022-04-16 LAB — BASIC METABOLIC PANEL
Anion gap: 10 (ref 5–15)
BUN: 32 mg/dL — ABNORMAL HIGH (ref 8–23)
CO2: 29 mmol/L (ref 22–32)
Calcium: 8.5 mg/dL — ABNORMAL LOW (ref 8.9–10.3)
Chloride: 96 mmol/L — ABNORMAL LOW (ref 98–111)
Creatinine, Ser: 1.11 mg/dL — ABNORMAL HIGH (ref 0.44–1.00)
GFR, Estimated: 45 mL/min — ABNORMAL LOW (ref >60)
Glucose, Bld: 157 mg/dL — ABNORMAL HIGH (ref 70–99)
Potassium: 3.9 mmol/L (ref 3.5–5.1)
Sodium: 135 mmol/L (ref 135–145)

## 2022-04-16 MED ORDER — CHLORHEXIDINE GLUCONATE CLOTH 2 % EX PADS
6.0000 | MEDICATED_PAD | Freq: Every day | CUTANEOUS | Status: DC
  Administered 2022-04-16 – 2022-04-18 (×3): 6 via TOPICAL

## 2022-04-16 MED ORDER — SODIUM CHLORIDE 0.9 % IV SOLN
2.0000 g | INTRAVENOUS | Status: DC
  Administered 2022-04-16 – 2022-04-19 (×4): 2 g via INTRAVENOUS
  Filled 2022-04-16 (×5): qty 20

## 2022-04-16 NOTE — Progress Notes (Signed)
  Echocardiogram 2D Echocardiogram has been performed.  Tara Bradley 04/16/2022, 2:38 PM

## 2022-04-16 NOTE — Progress Notes (Signed)
Mobility Specialist Progress Note:   04/16/22 1340  Mobility  Activity Transferred from chair to bed  Level of Assistance Moderate assist, patient does 50-74%  Assistive Device Stedy  Activity Response Tolerated well  $Mobility charge 1 Mobility   Pt in chair asking to get back to bed. No complaints. ModA to stand in steady, pt able to maintain NWB in RLE. Left in bed with call bell in reach and all needs met.   Tara Bradley Tara Bradley Mobility Specialist Please contact via [employer] or  Rehab Office at [phone]

## 2022-04-16 NOTE — Progress Notes (Addendum)
Rounding Note    Patient Name: Tara Bradley Date of Encounter: 04/16/2022  [place] Cardiologist: [name], MD   Subjective   Denies any CP or SOB.   Inpatient Medications    Scheduled Meds:  Chlorhexidine Gluconate Cloth  6 each Topical Daily   diltiazem  480 mg Oral Daily   ferrous gluconate  324 mg Oral BID WC   insulin aspart  0-5 Units Subcutaneous QHS   insulin aspart  0-9 Units Subcutaneous TID WC   insulin glargine  12 Units Subcutaneous QHS   metoprolol succinate  100 mg Oral q morning   polyethylene glycol  17 g Oral Daily   Rivaroxaban  15 mg Oral Q supper   senna  1 tablet Oral BID   tamsulosin  0.4 mg Oral Daily   Continuous Infusions:  cefTRIAXone (ROCEPHIN)  IV     diltiazem (CARDIZEM) infusion 15 mg/hr (04/16/22 0451)   lactated ringers 50 mL/hr at 04/16/22 0451   PRN Meds: acetaminophen **OR** acetaminophen, melatonin, mouth rinse   Vital Signs    Vitals:   04/16/22 0001 04/16/22 0216 04/16/22 0344 04/16/22 0831  BP: 99/81 106/70 96/68 103/77  Pulse: (!) 107 89 98 95  Resp: 20 (!) '23 20 20  '$ Temp: 97.7 F (36.5 C)  97.8 F (36.6 C) 97.7 F (36.5 C)  TempSrc: Oral  Oral Oral  SpO2: 97%  96% 98%  Weight:      Height:        Intake/Output Summary (Last 24 hours) at 04/16/2022 0928 Last data filed at 04/16/2022 0824 Gross per 24 hour  Intake 2438.11 ml  Output 2750 ml  Net -311.89 ml      04/14/2022    4:48 PM 03/19/2022   12:00 AM 02/15/2022   11:08 AM  Last 3 Weights  Weight (lbs) 144 lb 148 lb 13 oz 143 lb 12.8 oz  Weight (kg) 65.318 kg 67.5 kg 65.227 kg      Telemetry    Atrial fibrillation with HR 100-120s - Personally Reviewed  ECG    Atrial fibrillation with RVR, TWI in the inferolateral leads - Personally Reviewed  Physical Exam   GEN: No acute distress.   Neck: No JVD Cardiac: irregularly irregular, no murmurs, rubs, or gallops.  Respiratory: Clear to auscultation bilaterally. GI: Soft,  nontender, non-distended  MS: No edema; No deformity. Neuro:  Nonfocal  Psych: Normal affect   Labs    High Sensitivity Troponin:  No results for input(s): "TROPONINIHS" in the last 720 hours.   Chemistry Recent Labs  Lab 04/14/22 1712 04/15/22 0327 04/16/22 0102  NA 132* 132* 135  K 4.7 3.6 3.9  CL 92* 90* 96*  CO2 '28 29 29  '$ GLUCOSE 70 92 157*  BUN 49* 41* 32*  CREATININE 1.43* 1.22* 1.11*  CALCIUM 8.5* 8.7* 8.5*  MG 2.1  --  1.9  PROT 5.7*  --   --   ALBUMIN 2.5*  --   --   AST 16  --   --   ALT 7  --   --   ALKPHOS 97  --   --   BILITOT 0.5  --   --   GFRNONAA 33* 40* 45*  ANIONGAP '12 13 10    '$ Lipids No results for input(s): "CHOL", "TRIG", "HDL", "LABVLDL", "LDLCALC", "CHOLHDL" in the last 168 hours.  Hematology Recent Labs  Lab 04/14/22 1712 04/15/22 0327 04/16/22 0102  WBC 8.0 10.3 10.1  RBC 3.27*  3.14* 3.13*  HGB 9.6* 9.4* 9.4*  HCT 30.3* 28.8* 28.5*  MCV 92.7 91.7 91.1  MCH 29.4 29.9 30.0  MCHC 31.7 32.6 33.0  RDW 16.0* 16.0* 16.1*  PLT 298 293 306   Thyroid  Recent Labs  Lab 04/15/22 0327  TSH 3.529    BNP Recent Labs  Lab 04/15/22 0327  BNP 750.3*    DDimer No results for input(s): "DDIMER" in the last 168 hours.   Radiology    DG CHEST PORT 1 VIEW  Result Date: 04/15/2022 CLINICAL DATA:  88 year old female with persistent diarrhea. EXAM: PORTABLE CHEST 1 VIEW COMPARISON:  CT Abdomen and Pelvis 04/14/2022. Chest radiographs 03/19/2022 and earlier. FINDINGS: Portable AP upright view at 0804 hours. Bilateral layering pleural effusions better demonstrated by CT yesterday, small. Stable cardiomegaly and mediastinal contours. Calcified aortic atherosclerosis. Pulmonary vascularity not significantly changed from last month, no overt edema. No pneumothorax. Chronic left rib fractures. Right shoulder arthroplasty. Moderate to severe scoliosis. Chronic postoperative changes to the left humerus. Chronic left axillary surgical clips. IMPRESSION: 1.  Small bilateral pleural effusions with atelectasis better demonstrated by CT yesterday. 2. No other acute cardiopulmonary abnormality. 3. Aortic Atherosclerosis (ICD10-I70.0). Electronically Signed   By: Genevie Ann M.D.   On: 04/15/2022 08:45   CT ABDOMEN PELVIS W CONTRAST  Result Date: 04/14/2022 CLINICAL DATA:  Abdominal pain, urinary retention EXAM: CT ABDOMEN AND PELVIS WITH CONTRAST TECHNIQUE: Multidetector CT imaging of the abdomen and pelvis was performed using the standard protocol following bolus administration of intravenous contrast. RADIATION DOSE REDUCTION: This exam was performed according to the departmental dose-optimization program which includes automated exposure control, adjustment of the mA and/or kV according to patient size and/or use of iterative reconstruction technique. CONTRAST:  49m OMNIPAQUE IOHEXOL 350 MG/ML SOLN COMPARISON:  09/28/2016 FINDINGS: Lower chest: Small bilateral pleural effusions. Compressive atelectasis in the lower lobes. Diffuse low-density throughout the liver suggesting fatty infiltration. Peripheral cyst in the right hepatic lobe measures 2 cm. Small layering gallstones within the gallbladder. Hepatobiliary: No focal abnormality or ductal dilatation. Pancreas: No focal abnormality.  Normal size. Spleen: No focal abnormality.  Normal size. Adrenals/Urinary Tract: Numerous bilateral small renal cysts, the largest in the right midpole measuring 16 mm. No follow-up imaging recommended. No hydronephrosis. Areas of decreased enhancement with striations in the right kidney compatible with pyelonephritis. Adrenal glands unremarkable. Foley catheter present in the bladder. Stomach/Bowel: Few scattered sigmoid diverticula. No active diverticulitis. Stomach and small bowel decompressed, grossly unremarkable. Vascular/Lymphatic: Aortic atherosclerosis. No evidence of aneurysm or adenopathy. Reproductive: Prior hysterectomy.  No adnexal masses. Other: Trace free fluid in the  pelvis.  No free air. Musculoskeletal: No acute bony abnormality. IMPRESSION: Areas of decreased enhancement and striations within the right kidney compatible with pyelonephritis. Cholelithiasis. Aortic atherosclerosis. Electronically Signed   By: KRolm BaptiseM.D.   On: 04/14/2022 21:14    Cardiac Studies   Echo 09/04/2021  1. Left ventricular ejection fraction, by estimation, is 60 to 65%. The  left ventricle has normal function. The left ventricle has no regional  wall motion abnormalities. Left ventricular diastolic function could not  be evaluated.   2. Right ventricular systolic function is mildly reduced. The right  ventricular size is moderately enlarged. There is moderately elevated  pulmonary artery systolic pressure. The estimated right ventricular  systolic pressure is 547.8mmHg.   3. Left atrial size was moderately dilated.   4. Right atrial size was severely dilated.   5. Large pleural effusion in the left  lateral region.   6. Tricuspid valve regurgitation is moderate.   7. The inferior vena cava is normal in size with greater than 50%  respiratory variability, suggesting right atrial pressure of 3 mmHg.    Patient Profile     88 y.o. female with PMH of permanent atrial fibrillation with Xarelto, HTN, HLD, chronic diastolic CHF, aortic valve sclerosis and history of thrombosed descending aortic dissection presented with abdominal pain with diarrhea, nausea and PO intake. She was found to have pyelonephritis. She was also in afib with RVR on arrival and was placed on IV diltiazem.   Assessment & Plan    Permanent atrial fibrillation with RVR in the setting of pyelonephritis  -She is asymptomatic in afib.  IV diltiazem transitioned to oral cardizem 480 mg (home dose). Continue metoprolol succinate '100mg'$  daily (home dose). Suspect rates will improve with treatment of her infection. Per patient, her HR at home frequently spikes to >100 bpm.   Pyelonephritis: per primary  team  HTN: home spironolactone held. On metoprolol and diltiazem  HLD  Chronic diastolic CHF: elevated JVD on exam, but otherwise euvolemic, may be related to her TR.  Will check echo.  Would avoid further IV fluids      For questions or updates, please contact [place] Please consult [email or web address] for contact info under        Signed, Almyra Deforest, [place]  04/16/2022, 9:28 AM     Patient seen and examined.  Agree with above documentation.  On exam, patient is alert and oriented, tachycardic, irregular, no murmurs, lungs CTAB, no LE edema, +JVD.  Suspect Afib rates elevated due to acute infection.  Have restarted home diltiazem and Toprol-XL and rates appear improved.  She is off diltiazem drip.  Suspect rates will continue to improve as she recovers from her pyelonephritis.  Does have elevated JVD on exam but otherwise appears euvolemic.  JVD may be related to TR.  Will check echo.  Donato Heinz, MD

## 2022-04-16 NOTE — Progress Notes (Signed)
PHARMACY NOTE:  ANTIMICROBIAL RENAL DOSAGE ADJUSTMENT  Current antimicrobial regimen includes a mismatch between antimicrobial dosage and estimated renal function.  As per policy approved by the Pharmacy & Therapeutics and Medical Executive Committees, the antimicrobial dosage will be adjusted accordingly.  Current antimicrobial dosage:  ceftriaxone 1g q24h  Indication: pyelonephritis  Renal Function:  Estimated Creatinine Clearance: 26.6 mL/min (A) (by C-G formula based on SCr of 1.11 mg/dL (H)). '[]'$      On intermittent HD, scheduled: '[]'$      On CRRT    Antimicrobial dosage has been changed to:  ceftriaxone 2g q24h  Additional comments:   Thank you for allowing pharmacy to be a part of this patient's care.  Einar Grad, [facility] 04/16/2022 8:10 AM

## 2022-04-16 NOTE — Progress Notes (Addendum)
Orthopedic Surgery Progress Note   Assessment: Patient is a 88 y.o. female with right bimalleolar ankle fracture being treated non-operatively   Plan: -No acute operative intervention -Okay for diet from orthopedic perspective -Usually use ASA '325mg'$  BID for ankle fractures for dvt ppx, but will defer to primary -Weight bearing status: non-weight bearing right lower extremity -PT/OT evaluate and treat -Pain control -Follow up in my office in 2 weeks -Will consider splint removal at next visit if continues to show healing. Would transition to boot when out of bed and okay to be without boot when in bed  ___________________________________________________________________________  Subjective: Admitted to the hospital. Tawni Pummel (friend) called the answering service to let me know that she was admitted for issues unrelated to her ankle. She had an appointment to see me Monday (04/19/2022) for her ankle, but Sunday Spillers wanted to see if I could see her in hospital since she likely will not be able to make it Monday.   Patient not having any pain in her right ankle. Has been in the splint. Has been non-weight bearing. Is at [facility] for her recovery. They have been working on exercises with her. She wants to get back to walking. Denies paresthesias and numbness.    Physical Exam:  General: no acute distress, appears stated age Neurologic: alert, answering questions appropriately, following commands Respiratory: unlabored breathing on room air, symmetric chest rise Psychiatric: appropriate affect, normal cadence to speech  MSK:   -Right lower extremity  Splint in place EHL/TA/GSC intact Plantarflexes and dorsiflexes toes Sensation intact to light touch in sural, saphenous, tibial, deep peroneal, and superficial peroneal nerve distributions Foot warm and well perfused  XR of the ankle taken and reviewed today showing stable alignment to right ankle bimalleolar ankle fracture. Early  callus formation noted at the lateral malleolus. Talus is still sitting underneath the tibia. No new fractures. No dislocation.    Patient name: Tara Bradley Patient MRN: [id number] Date: 04/16/22

## 2022-04-16 NOTE — Discharge Instructions (Signed)
Orthopedic Surgery Discharge Instructions  Patient name: Tara Bradley Fracture: right ankle fracture Orthopedic surgeon: Ileene Rubens, MD  Driving: You should not drive until you are allowed to fully weight bear on your right lower extremity which will be determined at a future office appointment.     Reasons to Call the Office After Surgery: You should feel free to call the office with any concerns or questions you have in the post-operative period, but you should definitely notify the office if you develop: -shortness of breath, chest pain, or trouble breathing -issues with your splint -new or worsening pain around the ankle -other concerns about your ankle  Follow Up Appointments: You should have an office appointment scheduled for approximately two weeks from 04/19/2022. This would mean an appointment around 05/03/2022. If you do not remember when this appointment is or do not already have it scheduled, please call the office to schedule.   Office Information:  -Dr. Ileene Rubens -Phone number: [phone] -Address: [address]       [place], [place] [address]

## 2022-04-16 NOTE — Evaluation (Signed)
Occupational Therapy Evaluation Patient Details Name: Tara Bradley MRN: [id number] DOB: Oct 24, 1922 Today's Date: 04/16/2022   History of Present Illness 88 yo female [facility] from SNF with diarrhea x6 weeks, AMS, afib. Workup for acute pyelonephritis, afib with RVR. Recent admission 10/26-10/30 for bimalleolar fracture of R ankle, nonop. PMH includes DM 2, thoracic aortic aneurysm, chronic A-fib on Xarelto, HTN, HLD, HF, chronic respiratory failure on 4LO2 as needed, breast cancer, osteoporosis.   Clinical Impression   Patient admitted for above and presents with problem list below, including impaired generalized weakness, R LE NWB in splint, pain in R shoulder and R LE, impaired balance and decreased activity tolerance. She reports being at rehab for 4 weeks, and having assist for transfers and some ADLs.  Pt sitting EOB with PT upon entry, requires mod-max assist +2 for sit to stand and pivot to recliner.  She requires setup to total assist for Adls.  HR in afib ranged at max up to 152 briefly.  Based on performance today, pt will best benefit from continued OT service acutely and after dc at SNF level to optimize independence with ADLs and mobility.      Recommendations for follow up therapy are one component of a multi-disciplinary discharge planning process, led by the attending physician.  Recommendations may be updated based on patient status, additional functional criteria and insurance authorization.   Follow Up Recommendations  Skilled nursing-short term rehab (<3 hours/day)     Assistance Recommended at Discharge Frequent or constant Supervision/Assistance  Patient can return home with the following A lot of help with walking and/or transfers;A lot of help with bathing/dressing/bathroom;Assistance with cooking/housework;Assist for transportation;Help with stairs or ramp for entrance    Functional Status Assessment  Patient has had a recent decline in their  functional status and demonstrates the ability to make significant improvements in function in a reasonable and predictable amount of time.  Equipment Recommendations  None recommended by OT    Recommendations for Other Services       Precautions / Restrictions Precautions Precautions: Fall Restrictions Weight Bearing Restrictions: Yes RLE Weight Bearing: Non weight bearing Other Position/Activity Restrictions: NWB for 2 more weeks per pt      Mobility Bed Mobility               General bed mobility comments: EOB with PT upon entry    Transfers Overall transfer level: Needs assistance Equipment used: Rolling walker (2 wheels) Transfers: Sit to/from Stand, Bed to chair/wheelchair/BSC Sit to Stand: Mod assist, Max assist, +2 physical assistance, +2 safety/equipment Stand pivot transfers: Mod assist, +2 safety/equipment, +2 physical assistance         General transfer comment: sit to stand x 2 pt pulling on RW with mod assist +2 to power up, as fatigues requires max asisst +2 for third stand. Pivot on L LE with mod assist +2 to recliner.      Balance Overall balance assessment: Needs assistance Sitting-balance support: No upper extremity supported, Feet supported Sitting balance-Leahy Scale: Fair     Standing balance support: Bilateral upper extremity supported, During functional activity Standing balance-Leahy Scale: Poor Standing balance comment: relies on BUE and external support                           ADL either performed or assessed with clinical judgement   ADL Overall ADL's : Needs assistance/impaired     Grooming: Set up;Sitting  Upper Body Dressing : Minimal assistance;Sitting   Lower Body Dressing: Maximal assistance;Sit to/from stand;+2 for physical assistance;+2 for safety/equipment   Toilet Transfer: Moderate assistance;+2 for physical assistance;+2 for safety/equipment;Stand-pivot;Rolling walker (2 wheels) Toilet  Transfer Details (indicate cue type and reason): simulated to recliner         Functional mobility during ADLs: Moderate assistance;Maximal assistance;+2 for safety/equipment;+2 for physical assistance;Rolling walker (2 wheels)       Vision   Vision Assessment?: No apparent visual deficits     Perception     Praxis      Pertinent Vitals/Pain Pain Assessment Pain Assessment: Faces Faces Pain Scale: Hurts little more Pain Location: R shoulder (history of shoulder replacement), R ankle Pain Descriptors / Indicators: Sore, Discomfort Pain Intervention(s): Limited activity within patient's tolerance, Monitored during session, Repositioned     Hand Dominance Right   Extremity/Trunk Assessment Upper Extremity Assessment Upper Extremity Assessment: RUE deficits/detail RUE Deficits / Details: hx of shoulder replacement 12/22, limited shoulder flexion actively RUE Coordination: decreased gross motor   Lower Extremity Assessment Lower Extremity Assessment: Defer to PT evaluation       Communication Communication Communication: No difficulties   Cognition Arousal/Alertness: Awake/alert Behavior During Therapy: WFL for tasks assessed/performed Overall Cognitive Status: Within Functional Limits for tasks assessed                                 General Comments: pt with good recall of restrictions to R LE and following commands well.  appears WFL.     General Comments  HR afib ranging from 110s-130s, 1x up to 152    Exercises     Shoulder Instructions      Home Living Family/patient expects to be discharged to:: Skilled nursing facility Living Arrangements: Alone Available Help at Discharge: Available PRN/intermittently;Friend(s) Type of Home: Apartment Home Access: Level entry     Home Layout: One level     Bathroom Shower/Tub: Occupational psychologist: Handicapped height     Home Equipment: Conservation officer, nature (2 wheels);Cane - single  point;Shower seat;Rollator (4 wheels);BSC/3in1;Wheelchair - manual          Prior Functioning/Environment Prior Level of Function : Needs assist;History of Falls (last six months)             Mobility Comments: PTA to SNF, pt was using RW and was "trialling power chair" given history of multiple falls in the past 6 months. Now since fracture, pt was using RW for transfer-level mobility ADLs Comments: pt reports getting some assist for dressing/bathing at SNF        OT Problem List: Decreased strength;Decreased activity tolerance;Impaired balance (sitting and/or standing);Pain;Decreased knowledge of precautions;Decreased knowledge of use of DME or AE      OT Treatment/Interventions: Therapeutic exercise;Self-care/ADL training;DME and/or AE instruction;Therapeutic activities;Balance training;Patient/family education    OT Goals(Current goals can be found in the care plan section) Acute Rehab OT Goals Patient Stated Goal: get back to rehab OT Goal Formulation: With patient Time For Goal Achievement: 04/30/22 Potential to Achieve Goals: Good ADL Goals Pt Will Perform Grooming: with modified independence;sitting Pt Will Transfer to Toilet: with min assist;bedside commode;stand pivot transfer Pt/caregiver will Perform Home Exercise Program: Increased strength;Both right and left upper extremity;With written HEP provided;With theraband;Increased ROM  OT Frequency: Min 1X/week    Co-evaluation              AM-PAC OT "6 Clicks" Daily Activity  Outcome Measure Help from another person eating meals?: A Little Help from another person taking care of personal grooming?: A Little Help from another person toileting, which includes using toliet, bedpan, or urinal?: Total Help from another person bathing (including washing, rinsing, drying)?: A Lot Help from another person to put on and taking off regular upper body clothing?: A Little Help from another person to put on and taking  off regular lower body clothing?: Total 6 Click Score: 13   End of Session Equipment Utilized During Treatment: Rolling walker (2 wheels);Gait belt Nurse Communication: Mobility status  Activity Tolerance: Patient tolerated treatment well Patient left: in chair;with call bell/phone within reach;with chair alarm set  OT Visit Diagnosis: Other abnormalities of gait and mobility (R26.89);Muscle weakness (generalized) (M62.81);Pain;History of falling (Z91.81)                Time: 7341-9379 OT Time Calculation (min): 18 min Charges:  OT General Charges $OT Visit: 1 Visit OT Evaluation $OT Eval Moderate Complexity: 1 Mod  Jolaine Artist, OT Acute Rehabilitation Services Office [phone]   Delight Stare 04/16/2022, 12:52 PM

## 2022-04-16 NOTE — Progress Notes (Signed)
PROGRESS NOTE    Tara Bradley  KGM:[id number] DOB: 10/09/1922 DOA: 04/14/2022 PCP: [name], MD   Brief Narrative:  HPI: Tara Bradley is a 88 y.o. female with medical history significant of chronic A-fib on Xarelto, insulin-dependent type 2 diabetes, hyperlipidemia, hypertension, chronic diastolic CHF, chronic hypoxemic respiratory failure on 4 L O2 as needed, history of breast cancer, aortic aneurysm with chronic descending dissection.  Admitted last month 10/26-10/30 for bimalleolar fracture of right ankle managed nonoperatively as patient refused surgical intervention.  Patient returns to the ED via EMS from her nursing facility for evaluation of abdominal pain/distention and vomiting in the setting of nonbloody diarrhea for the past few weeks.  Noted to be in A-fib with rate in the 140s.  Hypotensive with systolic as low as 64Q, improved after fluid boluses.  Labs showing no leukocytosis, hemoglobin 9.6 (no significant change from baseline), sodium 132, chloride 92, BUN 49, creatinine 1.4 (baseline 0.8-1.0), calcium 8.5, albumin 2.5, lipase and LFTs normal, lactic acid normal, magnesium normal. Patient also noted to have acute urinary retention with bladder scan showing 2.3 L and Foley catheter was placed.  UA with large amount of leukocytes and microscopy showing >50 WBCs and many bacteria.  Urine culture pending.  CT abdomen pelvis showing findings compatible with right-sided pyelonephritis. Patient was given ceftriaxone, 2 L LR boluses, metoprolol, and started on Cardizem infusion.  TRH called to admit.   Patient states she was constipated a few weeks ago and was given laxatives at her facility and since then having ongoing diarrhea.  She is also endorsing right-sided abdominal pain and was vomiting earlier at her facility.  Denies any nausea at present and was able to tolerate p.o. intake in the ED.  Denies flank pain but endorsing dysuria for the past 2 weeks.  Denies fevers, cough,  shortness of breath, or chest pain.  Assessment & Plan:   Principal Problem:   Acute pyelonephritis Active Problems:   Bimalleolar fracture of right ankle   Chronic atrial fibrillation with RVR (HCC)   Chronic hypoxemic respiratory failure (HCC)   Chronic congestive heart failure with left ventricular diastolic dysfunction (HCC)   Acute urinary retention   AKI (acute kidney injury) ([place])   Diarrhea   QT prolongation   Insulin dependent type 2 diabetes mellitus (HCC)   Chronic anemia  Acute pyelonephritis: CT showing findings compatible with right-sided pyelonephritis.  No fever, leukocytosis, or lactic acidosis.  She is Niccoli improving as well.  Continue Rocephin and follow culture.  Chronic A-fib with RVR Likely precipitated by infection and dehydration in setting of vomiting/ ongoing diarrhea.  Patient received metoprolol in the ED and was started on Cardizem infusion.  Received 2 L LR boluses due to hypotension.  Currently on maximum dose Cardizem infusion and rate continues to be uncontrolled fluctuating around 120s, Potassium and magnesium within normal range.  Cardiology following, they have transition her from IV diltiazem to home dose of oral Cardizem CD as well as beta-blocker, considering digoxin.  Continue Xarelto.  Management per cardiology.  b.   AKI Acute urinary retention BUN 49, creatinine 1.4 (baseline 0.8-1.0).  Bladder scan done in the ED showing 2.3 L and Foley catheter was placed.  This is likely due to acute infection as well.  Creatinine improving.  Continue home Flomax.  Avoid nephrotoxic agents.  Continue to hold diuretics.  Stop IV fluids now.   Diarrhea No evidence of colitis on CT.  No fever or leukocytosis. -C. difficile PCR and  GI pathogen panel, enteric precautions, patient has not had any bowel movement since admission that almost ruled out C. difficile.  Will discontinue enteric precautions.  Nurses have been advised that if patient were to have  diarrhea to collect sample.   QT prolongation Potassium and magnesium within normal range. -Cardiac monitoring -Avoid QT prolonging drugs if possible   Insulin-dependent type 2 diabetes Last A1c 8.6 in December 2022. -Repeat A1c -Continue home basal insulin -Sensitive sliding scale insulin ACHS   Chronic diastolic CHF Appears euvolemic. -Hold Lasix and spironolactone given AKI   Chronic hypoxemic respiratory failure Uses 2 L of oxygen chronically at home, no change in oxygen requirement from baseline. -Continue to monitor   Chronic anemia Hemoglobin 9.6, no significant change from baseline. -Continue to monitor   Recent bimalleolar fracture of right ankle Patient had declined surgery and was managed nonoperatively.  Currently in a splint. -Continue pain management -Outpatient orthopedic follow-up  DVT prophylaxis: Xarelto   Code Status: DNR  Family Communication:  None present at bedside.  Plan of care discussed with patient in length and he/she verbalized understanding and agreed with it.  Status is: Inpatient Remains inpatient appropriate because: On Cardizem drip for uncontrolled A-fib   Estimated body mass index is 23.6 kg/m as calculated from the following:   Height as of this encounter: 5' 5.5" (1.664 m).   Weight as of this encounter: 65.3 kg.    Nutritional Assessment: Body mass index is 23.6 kg/m.[name] Seen by dietician.  I agree with the assessment and plan as outlined below: Nutrition Status:        . Skin Assessment: I have examined the patient's skin and I agree with the wound assessment as performed by the wound care RN as outlined below:    Consultants:  Cardiology  Procedures:  None  Antimicrobials:  Anti-infectives (From admission, onward)    Start     Dose/Rate Route Frequency Ordered Stop   04/16/22 1800  cefTRIAXone (ROCEPHIN) 2 g in sodium chloride 0.9 % 100 mL IVPB        2 g 200 mL/hr over 30 Minutes Intravenous Every 24 hours  04/16/22 0810     04/15/22 1800  cefTRIAXone (ROCEPHIN) 1 g in sodium chloride 0.9 % 100 mL IVPB  Status:  Discontinued        1 g 200 mL/hr over 30 Minutes Intravenous Every 24 hours 04/15/22 0226 04/16/22 0810   04/14/22 1900  cefTRIAXone (ROCEPHIN) 1 g in sodium chloride 0.9 % 100 mL IVPB        1 g 200 mL/hr over 30 Minutes Intravenous  Once 04/14/22 1853 04/14/22 2000         Subjective:  Patient seen and examined.  She has no specific complaint, her heart rate was around 120 during my visit but she denied any palpitation or chest pain.  Objective: Vitals:   04/16/22 0216 04/16/22 0344 04/16/22 0831 04/16/22 1111  BP: 106/70 96/68 103/77 98/60  Pulse: 89 98 95 (!) 56  Resp: (!) '23 20 20 20  '$ Temp:  97.8 F (36.6 C) 97.7 F (36.5 C) 97.8 F (36.6 C)  TempSrc:  Oral Oral Oral  SpO2:  96% 98% 98%  Weight:      Height:        Intake/Output Summary (Last 24 hours) at 04/16/2022 1114 Last data filed at 04/16/2022 0824 Gross per 24 hour  Intake 2438.11 ml  Output 2750 ml  Net -311.89 ml    [employer]  04/14/22 1648  Weight: 65.3 kg    Examination:  General exam: Appears calm and comfortable  Respiratory system: Clear to auscultation. Respiratory effort normal. Cardiovascular system: S1 & S2 heard, irregularly irregular rate and rhythm. No JVD, murmurs, rubs, gallops or clicks. No pedal edema. Gastrointestinal system: Abdomen is nondistended, soft and nontender. No organomegaly or masses felt. Normal bowel sounds heard. Central nervous system: Alert and oriented. No focal neurological deficits. Extremities: Symmetric 5 x 5 power. Skin: No rashes, lesions or ulcers.  Psychiatry: Judgement and insight appear normal. Mood & affect appropriate.   Data Reviewed: I have personally reviewed following labs and imaging studies  CBC: Recent Labs  Lab 04/14/22 1712 04/15/22 0327 04/16/22 0102  WBC 8.0 10.3 10.1  NEUTROABS 6.6  --  8.5*  HGB 9.6* 9.4* 9.4*  HCT  30.3* 28.8* 28.5*  MCV 92.7 91.7 91.1  PLT 298 293 638    Basic Metabolic Panel: Recent Labs  Lab 04/14/22 1712 04/15/22 0327 04/16/22 0102  NA 132* 132* 135  K 4.7 3.6 3.9  CL 92* 90* 96*  CO2 '28 29 29  '$ GLUCOSE 70 92 157*  BUN 49* 41* 32*  CREATININE 1.43* 1.22* 1.11*  CALCIUM 8.5* 8.7* 8.5*  MG 2.1  --  1.9    GFR: Estimated Creatinine Clearance: 26.6 mL/min (A) (by C-G formula based on SCr of 1.11 mg/dL (H)). Liver Function Tests: Recent Labs  Lab 04/14/22 1712  AST 16  ALT 7  ALKPHOS 97  BILITOT 0.5  PROT 5.7*  ALBUMIN 2.5*    Recent Labs  Lab 04/14/22 1712  LIPASE 28    No results for input(s): "AMMONIA" in the last 168 hours. Coagulation Profile: Recent Labs  Lab 04/14/22 1712  INR 1.3*    Cardiac Enzymes: No results for input(s): "CKTOTAL", "CKMB", "CKMBINDEX", "TROPONINI" in the last 168 hours. BNP (last 3 results) No results for input(s): "PROBNP" in the last 8760 hours. HbA1C: No results for input(s): "HGBA1C" in the last 72 hours. CBG: Recent Labs  Lab 04/15/22 1757 04/15/22 2118 04/16/22 0647 04/16/22 0835 04/16/22 1109  GLUCAP 136* 247* 62* 138* 121*    Lipid Profile: No results for input(s): "CHOL", "HDL", "LDLCALC", "TRIG", "CHOLHDL", "LDLDIRECT" in the last 72 hours. Thyroid Function Tests: Recent Labs    04/15/22 0327  TSH 3.529   Anemia Panel: No results for input(s): "VITAMINB12", "FOLATE", "FERRITIN", "TIBC", "IRON", "RETICCTPCT" in the last 72 hours. Sepsis Labs: Recent Labs  Lab 04/14/22 1712 04/15/22 0327  LATICACIDVEN 0.9 1.0     No results found for this or any previous visit (from the past 240 hour(s)).   Radiology Studies: DG Ankle Complete Right  Result Date: 04/16/2022 CLINICAL DATA:  Bimalleolar fracture. EXAM: RIGHT ANKLE - COMPLETE 3+ VIEW COMPARISON:  04/02/2022 FINDINGS: Ankle is imaged through splinting material. There is persistent oblique fracture of the MEDIAL and LATERAL malleolus. There  is callus formation at both of the fracture sites. No change in displacement. IMPRESSION: Healing fractures of the MEDIAL and LATERAL malleolus. Electronically Signed   By: Nolon Nations M.D.   On: 04/16/2022 09:31   DG CHEST PORT 1 VIEW  Result Date: 04/15/2022 CLINICAL DATA:  88 year old female with persistent diarrhea. EXAM: PORTABLE CHEST 1 VIEW COMPARISON:  CT Abdomen and Pelvis 04/14/2022. Chest radiographs 03/19/2022 and earlier. FINDINGS: Portable AP upright view at 0804 hours. Bilateral layering pleural effusions better demonstrated by CT yesterday, small. Stable cardiomegaly and mediastinal contours. Calcified aortic atherosclerosis. Pulmonary vascularity not significantly changed  from last month, no overt edema. No pneumothorax. Chronic left rib fractures. Right shoulder arthroplasty. Moderate to severe scoliosis. Chronic postoperative changes to the left humerus. Chronic left axillary surgical clips. IMPRESSION: 1. Small bilateral pleural effusions with atelectasis better demonstrated by CT yesterday. 2. No other acute cardiopulmonary abnormality. 3. Aortic Atherosclerosis (ICD10-I70.0). Electronically Signed   By: Genevie Ann M.D.   On: 04/15/2022 08:45   CT ABDOMEN PELVIS W CONTRAST  Result Date: 04/14/2022 CLINICAL DATA:  Abdominal pain, urinary retention EXAM: CT ABDOMEN AND PELVIS WITH CONTRAST TECHNIQUE: Multidetector CT imaging of the abdomen and pelvis was performed using the standard protocol following bolus administration of intravenous contrast. RADIATION DOSE REDUCTION: This exam was performed according to the departmental dose-optimization program which includes automated exposure control, adjustment of the mA and/or kV according to patient size and/or use of iterative reconstruction technique. CONTRAST:  69m OMNIPAQUE IOHEXOL 350 MG/ML SOLN COMPARISON:  09/28/2016 FINDINGS: Lower chest: Small bilateral pleural effusions. Compressive atelectasis in the lower lobes. Diffuse  low-density throughout the liver suggesting fatty infiltration. Peripheral cyst in the right hepatic lobe measures 2 cm. Small layering gallstones within the gallbladder. Hepatobiliary: No focal abnormality or ductal dilatation. Pancreas: No focal abnormality.  Normal size. Spleen: No focal abnormality.  Normal size. Adrenals/Urinary Tract: Numerous bilateral small renal cysts, the largest in the right midpole measuring 16 mm. No follow-up imaging recommended. No hydronephrosis. Areas of decreased enhancement with striations in the right kidney compatible with pyelonephritis. Adrenal glands unremarkable. Foley catheter present in the bladder. Stomach/Bowel: Few scattered sigmoid diverticula. No active diverticulitis. Stomach and small bowel decompressed, grossly unremarkable. Vascular/Lymphatic: Aortic atherosclerosis. No evidence of aneurysm or adenopathy. Reproductive: Prior hysterectomy.  No adnexal masses. Other: Trace free fluid in the pelvis.  No free air. Musculoskeletal: No acute bony abnormality. IMPRESSION: Areas of decreased enhancement and striations within the right kidney compatible with pyelonephritis. Cholelithiasis. Aortic atherosclerosis. Electronically Signed   By: KRolm BaptiseM.D.   On: 04/14/2022 21:14    Scheduled Meds:  Chlorhexidine Gluconate Cloth  6 each Topical Daily   diltiazem  480 mg Oral Daily   ferrous gluconate  324 mg Oral BID WC   insulin aspart  0-5 Units Subcutaneous QHS   insulin aspart  0-9 Units Subcutaneous TID WC   insulin glargine  12 Units Subcutaneous QHS   metoprolol succinate  100 mg Oral q morning   polyethylene glycol  17 g Oral Daily   Rivaroxaban  15 mg Oral Q supper   senna  1 tablet Oral BID   tamsulosin  0.4 mg Oral Daily   Continuous Infusions:  cefTRIAXone (ROCEPHIN)  IV     diltiazem (CARDIZEM) infusion Stopped (04/16/22 1000)   lactated ringers 50 mL/hr at 04/16/22 0451     LOS: 1 day   RDarliss Cheney MD [facility]  04/16/2022, 11:14 AM   *Please note that this is a verbal dictation therefore any spelling or grammatical errors are due to the "DAnaholaOne" system interpretation.  Please page via [place]orth [place] do not message via secure chat for urgent patient care matters. Secure chat can be used for non urgent patient care matters.  How to contact the [facility] CenterAttending or Consulting provider 7Reserveor covering provider during after hours [place] for this patient?  Check the care team in [facility] look for a) attending/consulting [facility] provider listed and b) the [facility]team listed. Page or secure chat [phone]. Log into [email or web address] and use [facility]'s universal password to access. If you do not have the password, please contact the hospital operator. Locate the [facility] provider you are looking for under [facility] and page to a number that you can be directly reached. If you still have difficulty reaching the provider, please page the [facility] (Director on Call) for the Hospitalists listed on amion for assistance.

## 2022-04-16 NOTE — Evaluation (Signed)
Physical Therapy Evaluation Patient Details Name: Tara Bradley MRN: [id number] DOB: 1922/09/01 Today's Date: 04/16/2022  History of Present Illness  88 yo female presents to [facility]/22 from SNF with diarrhea x6 weeks, AMS, afib. Workup for acute pyelonephritis, afib with RVR. Recent admission 10/26-10/30 for bimalleolar fracture of R ankle, nonop. PMH includes DM 2, thoracic aortic aneurysm, chronic A-fib on Xarelto, HTN, HLD, HF, chronic respiratory failure on 4LO2 as needed, breast cancer, osteoporosis.  Clinical Impression   Pt presents with generalized weakness, impaired balance, RLE pain, and decreased activity tolerance. Pt to benefit from acute PT to address deficits. Pt requiring mod +1-2 for transfer-level mobility at this time, would benefit from continued ST-SNF post-acutely. HR in afib rhythm with max observed 152 bpm, RN aware. PT to progress mobility as tolerated, and will continue to follow acutely.         Recommendations for follow up therapy are one component of a multi-disciplinary discharge planning process, led by the attending physician.  Recommendations may be updated based on patient status, additional functional criteria and insurance authorization.  Follow Up Recommendations Skilled nursing-short term rehab (<3 hours/day) Can patient physically be transported by private vehicle: Yes    Assistance Recommended at Discharge Frequent or constant Supervision/Assistance  Patient can return home with the following  A lot of help with walking and/or transfers;A lot of help with bathing/dressing/bathroom    Equipment Recommendations None recommended by PT  Recommendations for Other Services       Functional Status Assessment Patient has had a recent decline in their functional status and demonstrates the ability to make significant improvements in function in a reasonable and predictable amount of time.     Precautions / Restrictions Precautions Precautions:  Fall Restrictions Weight Bearing Restrictions: Yes RLE Weight Bearing: Non weight bearing Other Position/Activity Restrictions: NWB for 2 more weeks per pt      Mobility  Bed Mobility Overal bed mobility: Needs Assistance Bed Mobility: Supine to Sit     Supine to sit: Mod assist     General bed mobility comments: assist for trunk elevation, LE progression to EOB, scooting forward    Transfers Overall transfer level: Needs assistance Equipment used: Rolling walker (2 wheels) Transfers: Sit to/from Stand, Bed to chair/wheelchair/BSC Sit to Stand: Mod assist, Max assist, +2 physical assistance, +2 safety/equipment Stand pivot transfers: Mod assist, +2 safety/equipment, +2 physical assistance         General transfer comment: sit to stand x 2 pt pulling on RW with mod assist +2 to power up, as fatigues requires max asisst +2 for third stand. Pivot on L LE with mod assist +2 to recliner.    Ambulation/Gait                  Stairs            Wheelchair Mobility    Modified Rankin (Stroke Patients Only)       Balance Overall balance assessment: Needs assistance Sitting-balance support: No upper extremity supported, Feet supported Sitting balance-Leahy Scale: Fair     Standing balance support: Bilateral upper extremity supported, During functional activity Standing balance-Leahy Scale: Poor Standing balance comment: relies on BUE and external support                             Pertinent Vitals/Pain Pain Assessment Pain Assessment: Faces Faces Pain Scale: Hurts little more Pain Location: R shoulder (history of shoulder  replacement), R ankle Pain Descriptors / Indicators: Sore, Discomfort Pain Intervention(s): Limited activity within patient's tolerance, Monitored during session, Repositioned    Home Living Family/patient expects to be discharged to:: Skilled nursing facility Living Arrangements: Alone Available Help at Discharge:  Available PRN/intermittently;Friend(s) Type of Home: Apartment Home Access: Level entry       Home Layout: One level Home Equipment: Conservation officer, nature (2 wheels);Cane - single point;Shower seat;Rollator (4 wheels);BSC/3in1;Wheelchair - manual      Prior Function Prior Level of Function : Needs assist;History of Falls (last six months)             Mobility Comments: PTA to SNF, pt was using RW and was "trialling power chair" given history of multiple falls in the past 6 months. Now since fracture, pt was using RW for transfer-level mobility ADLs Comments: pt reports getting some assist for dressing/bathing at SNF     Hand Dominance   Dominant Hand: Right    Extremity/Trunk Assessment   Upper Extremity Assessment Upper Extremity Assessment: Defer to OT evaluation RUE Deficits / Details: hx of shoulder replacement 12/22, limited shoulder flexion actively RUE Coordination: decreased gross motor    Lower Extremity Assessment Lower Extremity Assessment: Generalized weakness;RLE deficits/detail RLE Deficits / Details: R ankle fx NWB RLE: Unable to fully assess due to immobilization    Cervical / Trunk Assessment Cervical / Trunk Assessment: Kyphotic  Communication   Communication: No difficulties  Cognition Arousal/Alertness: Awake/alert Behavior During Therapy: WFL for tasks assessed/performed Overall Cognitive Status: Within Functional Limits for tasks assessed                                 General Comments: pt with good recall of restrictions to R LE and following commands well.  appears WFL.        General Comments General comments (skin integrity, edema, etc.): HR afib ranging 110s/130s, 1x up to 152 and RN aware    Exercises     Assessment/Plan    PT Assessment Patient needs continued PT services  PT Problem List Decreased strength;Decreased mobility;Decreased activity tolerance;Decreased knowledge of use of DME;Decreased balance;Pain;Decreased  safety awareness;Decreased range of motion;Cardiopulmonary status limiting activity       PT Treatment Interventions DME instruction;Therapeutic activities;Therapeutic exercise;Patient/family education;Balance training;Functional mobility training    PT Goals (Current goals can be found in the Care Plan section)  Acute Rehab PT Goals PT Goal Formulation: With patient Time For Goal Achievement: 04/30/22 Potential to Achieve Goals: Good    Frequency Min 2X/week     Co-evaluation PT/OT/SLP Co-Evaluation/Treatment: Yes Reason for Co-Treatment: To address functional/ADL transfers;For patient/therapist safety PT goals addressed during session: Mobility/safety with mobility;Balance         AM-PAC PT "6 Clicks" Mobility  Outcome Measure Help needed turning from your back to your side while in a flat bed without using bedrails?: A Little Help needed moving from lying on your back to sitting on the side of a flat bed without using bedrails?: A Lot Help needed moving to and from a bed to a chair (including a wheelchair)?: A Lot Help needed standing up from a chair using your arms (e.g., wheelchair or bedside chair)?: A Lot Help needed to walk in hospital room?: Total Help needed climbing 3-5 steps with a railing? : Total 6 Click Score: 11    End of Session Equipment Utilized During Treatment: Gait belt;Oxygen (2LO2) Activity Tolerance: Patient tolerated treatment well;Patient limited by  fatigue Patient left: in chair;with call bell/phone within reach;with chair alarm set;with family/visitor present Nurse Communication: Mobility status PT Visit Diagnosis: Other abnormalities of gait and mobility (R26.89);Muscle weakness (generalized) (M62.81)    Time: 1254-8323 PT Time Calculation (min) (ACUTE ONLY): 33 min   Charges:   PT Evaluation $PT Eval Low Complexity: 1 Low         Harrington Jobe S, PT DPT Acute Rehabilitation Services Pager [phone]  Office [phone]    Roxine Caddy E  Ruffin Pyo 04/16/2022, 2:36 PM

## 2022-04-17 DIAGNOSIS — N10 Acute tubulo-interstitial nephritis: Secondary | ICD-10-CM | POA: Diagnosis not present

## 2022-04-17 LAB — BASIC METABOLIC PANEL
Anion gap: 9 (ref 5–15)
BUN: 20 mg/dL (ref 8–23)
CO2: 31 mmol/L (ref 22–32)
Calcium: 8.6 mg/dL — ABNORMAL LOW (ref 8.9–10.3)
Chloride: 97 mmol/L — ABNORMAL LOW (ref 98–111)
Creatinine, Ser: 0.91 mg/dL (ref 0.44–1.00)
GFR, Estimated: 57 mL/min — ABNORMAL LOW (ref >60)
Glucose, Bld: 84 mg/dL (ref 70–99)
Potassium: 4.1 mmol/L (ref 3.5–5.1)
Sodium: 137 mmol/L (ref 135–145)

## 2022-04-17 LAB — GLUCOSE, CAPILLARY
Glucose-Capillary: 83 mg/dL (ref 70–99)
Glucose-Capillary: 180 mg/dL — ABNORMAL HIGH (ref 70–99)
Glucose-Capillary: 204 mg/dL — ABNORMAL HIGH (ref 70–99)
Glucose-Capillary: 235 mg/dL — ABNORMAL HIGH (ref 70–99)

## 2022-04-17 LAB — HEMOGLOBIN A1C
Hgb A1c MFr Bld: 7.8 % — ABNORMAL HIGH (ref 4.8–5.6)
Mean Plasma Glucose: 177 mg/dL
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
## 2022-04-17 NOTE — Progress Notes (Signed)
PROGRESS NOTE    Tara Bradley  OVZ:[id number] DOB: [birth year] DOA: 04/14/2022 PCP: [name], MD   Brief Narrative:  HPI: Tara Bradley is a 88 y.o. female with medical history significant of chronic A-fib on Xarelto, insulin-dependent type 2 diabetes, hyperlipidemia, hypertension, chronic diastolic CHF, chronic hypoxemic respiratory failure on 4 L O2 as needed, history of breast cancer, aortic aneurysm with chronic descending dissection.  Admitted last month 10/26-10/30 for bimalleolar fracture of right ankle managed nonoperatively as patient refused surgical intervention.  Patient returns to the ED via EMS from her nursing facility for evaluation of abdominal pain/distention and vomiting in the setting of nonbloody diarrhea for the past few weeks.  Noted to be in A-fib with rate in the 140s.  Hypotensive with systolic as low as 41O, improved after fluid boluses.  Labs showing no leukocytosis, hemoglobin 9.6 (no significant change from baseline), sodium 132, chloride 92, BUN 49, creatinine 1.4 (baseline 0.8-1.0), calcium 8.5, albumin 2.5, lipase and LFTs normal, lactic acid normal, magnesium normal. Patient also noted to have acute urinary retention with bladder scan showing 2.3 L and Foley catheter was placed.  UA with large amount of leukocytes and microscopy showing >50 WBCs and many bacteria.  Urine culture pending.  CT abdomen pelvis showing findings compatible with right-sided pyelonephritis. Patient was given ceftriaxone, 2 L LR boluses, metoprolol, and started on Cardizem infusion.  TRH called to admit.   Patient states she was constipated a few weeks ago and was given laxatives at her facility and since then having ongoing diarrhea.  She is also endorsing right-sided abdominal pain and was vomiting earlier at her facility.  Denies any nausea at present and was able to tolerate p.o. intake in the ED.  Denies flank pain but endorsing dysuria for the past 2 weeks.  Denies fevers, cough,  shortness of breath, or chest pain.  Assessment & Plan:   Principal Problem:   Acute pyelonephritis Active Problems:   Bimalleolar fracture of right ankle   Chronic atrial fibrillation with RVR (HCC)   Chronic diastolic heart failure (HCC)   Chronic hypoxemic respiratory failure (HCC)   Chronic congestive heart failure with left ventricular diastolic dysfunction (HCC)   Acute urinary retention   AKI (acute kidney injury) ([place])   Diarrhea   QT prolongation   Insulin dependent type 2 diabetes mellitus (HCC)   Chronic anemia  Acute pyelonephritis: CT showing findings compatible with right-sided pyelonephritis.  No fever, leukocytosis, or lactic acidosis.  Clinically improving.  Continue Rocephin.  Chronic A-fib with RVR Likely precipitated by infection and dehydration in setting of vomiting/ ongoing diarrhea.  Patient received metoprolol in the ED and was started on Cardizem infusion.  Received 2 L LR boluses due to hypotension.  Cardiology following, they have transitioned her from IV diltiazem to home dose of oral Cardizem CD as well as beta-blocker on 04/16/2022, rates still elevated, defer to cardiology.   AKI Acute urinary retention BUN 49, creatinine 1.4 (baseline 0.8-1.0).  Bladder scan done in the ED showing 2.3 L and Foley catheter was placed.  This is likely due to acute infection as well.  Creatinine improved to normal.  Continue home Flomax.  Avoid nephrotoxic agents.  Continue to hold diuretics and resume tomorrow.  Will DC Foley catheter.   Diarrhea No evidence of colitis on CT.  No fever or leukocytosis. -C. difficile PCR and GI pathogen panel, enteric precautions, patient has not had any bowel movement since admission that almost ruled out C. difficile.  Will discontinue enteric precautions.  Nurses have been advised that if patient were to have diarrhea to collect sample.   QT prolongation Potassium and magnesium within normal range. -Cardiac monitoring -Avoid QT  prolonging drugs if possible   Insulin-dependent type 2 diabetes Last A1c 8.6 in December 2022. -Repeat A1c -Continue home basal insulin -Sensitive sliding scale insulin ACHS   Chronic diastolic CHF Appears euvolemic. -Hold Lasix and spironolactone given AKI   Chronic hypoxemic respiratory failure Uses 2 L of oxygen chronically at home, no change in oxygen requirement from baseline. -Continue to monitor   Chronic anemia Hemoglobin 9.6, no significant change from baseline. -Continue to monitor   Recent bimalleolar fracture of right ankle Patient had declined surgery and was managed nonoperatively.  Currently in a splint. -Continue pain management -Outpatient orthopedic follow-up  DVT prophylaxis: Xarelto   Code Status: DNR  Family Communication:  None present at bedside.  Plan of care discussed with patient in length and he/she verbalized understanding and agreed with it.  Status is: Inpatient Remains inpatient appropriate because: Still with uncontrolled atrial fibrillation    Estimated body mass index is 23.6 kg/m as calculated from the following:   Height as of this encounter: 5' 5.5" (1.664 m).   Weight as of this encounter: 65.3 kg.    Nutritional Assessment: Body mass index is 23.6 kg/m.[name] Seen by dietician.  I agree with the assessment and plan as outlined below: Nutrition Status:        . Skin Assessment: I have examined the patient's skin and I agree with the wound assessment as performed by the wound care RN as outlined below:    Consultants:  Cardiology  Procedures:  None  Antimicrobials:  Anti-infectives (From admission, onward)    Start     Dose/Rate Route Frequency Ordered Stop   04/16/22 1800  cefTRIAXone (ROCEPHIN) 2 g in sodium chloride 0.9 % 100 mL IVPB        2 g 200 mL/hr over 30 Minutes Intravenous Every 24 hours 04/16/22 0810     04/15/22 1800  cefTRIAXone (ROCEPHIN) 1 g in sodium chloride 0.9 % 100 mL IVPB  Status:  Discontinued         1 g 200 mL/hr over 30 Minutes Intravenous Every 24 hours 04/15/22 0226 04/16/22 0810   04/14/22 1900  cefTRIAXone (ROCEPHIN) 1 g in sodium chloride 0.9 % 100 mL IVPB        1 g 200 mL/hr over 30 Minutes Intravenous  Once 04/14/22 1853 04/14/22 2000         Subjective:  Patient seen and examined.  She says " I just do not feel well or any better" but when asked, she cannot elaborate and cannot, with any specific symptoms.  She denied any chest pain, palpitation, abdominal pain or back pain.  Objective: Vitals:   04/16/22 2000 04/16/22 2336 04/17/22 0400 04/17/22 0700  BP:  106/75  108/81  Pulse: (!) 110 (!) 104  (!) 153  Resp: '20 20  20  '$ Temp: 98.2 F (36.8 C) 98.4 F (36.9 C) 98.4 F (36.9 C) 97.6 F (36.4 C)  TempSrc: Oral Oral Oral Axillary  SpO2:  98%  98%  Weight:      Height:        Intake/Output Summary (Last 24 hours) at 04/17/2022 1011 Last data filed at 04/17/2022 0840 Gross per 24 hour  Intake 720 ml  Output 475 ml  Net 245 ml    Filed Weights   04/14/22  1648  Weight: 65.3 kg    Examination:  General exam: Appears calm and comfortable  Respiratory system: Clear to auscultation. Respiratory effort normal. Cardiovascular system: S1 & S2 heard, irregularly irregular RR. No JVD, murmurs, rubs, gallops or clicks. No pedal edema. Gastrointestinal system: Abdomen is nondistended, soft and nontender. No organomegaly or masses felt. Normal bowel sounds heard. Central nervous system: Alert and oriented. No focal neurological deficits. Extremities: Symmetric 5 x 5 power. Skin: No rashes, lesions or ulcers.  Psychiatry: Judgement and insight appear normal. Mood & affect appropriate.    Data Reviewed: I have personally reviewed following labs and imaging studies  CBC: Recent Labs  Lab 04/14/22 1712 04/15/22 0327 04/16/22 0102  WBC 8.0 10.3 10.1  NEUTROABS 6.6  --  8.5*  HGB 9.6* 9.4* 9.4*  HCT 30.3* 28.8* 28.5*  MCV 92.7 91.7 91.1  PLT 298 293  009    Basic Metabolic Panel: Recent Labs  Lab 04/14/22 1712 04/15/22 0327 04/16/22 0102 04/17/22 0808  NA 132* 132* 135 137  K 4.7 3.6 3.9 4.1  CL 92* 90* 96* 97*  CO2 '28 29 29 31  '$ GLUCOSE 70 92 157* 84  BUN 49* 41* 32* 20  CREATININE 1.43* 1.22* 1.11* 0.91  CALCIUM 8.5* 8.7* 8.5* 8.6*  MG 2.1  --  1.9  --     GFR: Estimated Creatinine Clearance: 32.5 mL/min (by C-G formula based on SCr of 0.91 mg/dL). Liver Function Tests: Recent Labs  Lab 04/14/22 1712  AST 16  ALT 7  ALKPHOS 97  BILITOT 0.5  PROT 5.7*  ALBUMIN 2.5*    Recent Labs  Lab 04/14/22 1712  LIPASE 28    No results for input(s): "AMMONIA" in the last 168 hours. Coagulation Profile: Recent Labs  Lab 04/14/22 1712  INR 1.3*    Cardiac Enzymes: No results for input(s): "CKTOTAL", "CKMB", "CKMBINDEX", "TROPONINI" in the last 168 hours. BNP (last 3 results) No results for input(s): "PROBNP" in the last 8760 hours. HbA1C: Recent Labs    04/14/22 1712  HGBA1C 7.8*   CBG: Recent Labs  Lab 04/16/22 0835 04/16/22 1109 04/16/22 1612 04/16/22 2129 04/17/22 0611  GLUCAP 138* 121* 170* 177* 83    Lipid Profile: No results for input(s): "CHOL", "HDL", "LDLCALC", "TRIG", "CHOLHDL", "LDLDIRECT" in the last 72 hours. Thyroid Function Tests: Recent Labs    04/15/22 0327  TSH 3.529    Anemia Panel: No results for input(s): "VITAMINB12", "FOLATE", "FERRITIN", "TIBC", "IRON", "RETICCTPCT" in the last 72 hours. Sepsis Labs: Recent Labs  Lab 04/14/22 1712 04/15/22 0327  LATICACIDVEN 0.9 1.0     No results found for this or any previous visit (from the past 240 hour(s)).   Radiology Studies: ECHOCARDIOGRAM COMPLETE  Result Date: 04/16/2022    ECHOCARDIOGRAM REPORT   Patient Name:   Tara Bradley Date of Exam: 04/16/2022 Medical Rec #:  [id number]        Height:       65.5 in Accession #:    [id number]       Weight:       144.0 lb Date of Birth:  [birth year]         BSA:          1.730  m Patient Age:    89 years         BP:           98/60 mmHg Patient Gender: F  HR:           108 bpm. Exam Location:  Inpatient Procedure: 2D Echo, 3D Echo, Color Doppler and Cardiac Doppler Indications:    I48.91* Unspeicified atrial fibrillation  History:        Patient has prior history of Echocardiogram examinations, most                 recent 09/04/2021. CHF, Abnormal ECG, Arrythmias:Atrial                 Fibrillation and Bradycardia, Signs/Symptoms:Syncope and Chest                 Pain; Risk Factors:Hypertension, Diabetes and Dyslipidemia.                 Breast cancer.  Sonographer:    Roseanna Rainbow RDCS Referring Phys: Doreatha Martin [facility]  Sonographer Comments: Technically difficult study due to poor echo windows. Image acquisition challenging due to mastectomy and Image acquisition challenging due to uncooperative patient. Patient would not let me continue exam. IMPRESSIONS  1. Left ventricular ejection fraction, by estimation, is 50 to 55%. Left ventricular ejection fraction by 3D volume is 54 %. The left ventricle has low normal function. The left ventricle has no regional wall motion abnormalities. There is mild asymmetric left ventricular hypertrophy of the infero-lateral segment. Left ventricular diastolic parameters are indeterminate. There is the interventricular septum is flattened in systole and diastole, consistent with right ventricular pressure and volume overload= this is most prominent at the apex.  2. Right ventricular systolic function is mildly reduced. The right ventricular size is severely enlarged. There is moderately elevated pulmonary artery systolic pressure. The estimated right ventricular systolic pressure is 43.3 mmHg.  3. Left atrial size was severely dilated.  4. Right atrial size was severely dilated.  5. The mitral valve is degenerative. Mild to moderate mitral valve regurgitation. No evidence of mitral stenosis. The mean mitral valve gradient is 2.0 mmHg with  average heart rate of 99 bpm. Moderate mitral annular calcification.  6. The aortic valve is tricuspid. There is mild calcification of the aortic valve. There is mild thickening of the aortic valve. Aortic valve regurgitation is not visualized. Aortic valve sclerosis is present, with no evidence of aortic valve stenosis.  7. No subcostal images were able to be performed. Comparison(s): No significant change from prior study. FINDINGS  Left Ventricle: Left ventricular ejection fraction, by estimation, is 50 to 55%. Left ventricular ejection fraction by 3D volume is 54 %. The left ventricle has low normal function. The left ventricle has no regional wall motion abnormalities. The left ventricular internal cavity size was normal in size. There is mild asymmetric left ventricular hypertrophy of the infero-lateral segment. The interventricular septum is flattened in systole and diastole, consistent with right ventricular pressure and volume overload. Left ventricular diastolic parameters are indeterminate. Right Ventricle: The right ventricular size is severely enlarged. No increase in right ventricular wall thickness. Right ventricular systolic function is mildly reduced. There is moderately elevated pulmonary artery systolic pressure. The tricuspid regurgitant velocity is 3.09 m/s, and with an assumed right atrial pressure of 15 mmHg, the estimated right ventricular systolic pressure is 29.5 mmHg. Left Atrium: Left atrial size was severely dilated. Right Atrium: Right atrial size was severely dilated. Pericardium: There is no evidence of pericardial effusion. Mitral Valve: 2D MVA 5.27 cm2. The mitral valve is degenerative in appearance. Moderate mitral annular calcification. Mild to moderate mitral valve regurgitation. No evidence of mitral  valve stenosis. MV peak gradient, 7.7 mmHg. The mean mitral valve gradient is 2.0 mmHg with average heart rate of 99 bpm. Tricuspid Valve: The tricuspid valve is grossly normal.  Tricuspid valve regurgitation is mild . No evidence of tricuspid stenosis. Aortic Valve: The aortic valve is tricuspid. There is mild calcification of the aortic valve. There is mild thickening of the aortic valve. There is mild aortic valve annular calcification. Aortic valve regurgitation is not visualized. Aortic valve sclerosis is present, with no evidence of aortic valve stenosis. Pulmonic Valve: The pulmonic valve was not well visualized. Pulmonic valve regurgitation is not visualized. No evidence of pulmonic stenosis. Aorta: The aortic root and ascending aorta are structurally normal, with no evidence of dilitation. IAS/Shunts: No atrial level shunt detected by color flow Doppler.  LEFT VENTRICLE PLAX 2D LVIDd:         4.20 cm LVIDs:         2.80 cm LV PW:         1.20 cm         3D Volume EF LV IVS:        1.00 cm         LV 3D EF:    Left LVOT diam:     2.00 cm                      ventricul LV SV:         60                           ar LV SV Index:   35                           ejection LVOT Area:     3.14 cm                     fraction                                             by 3D                                             volume is LV Volumes (MOD)                            54 %. LV vol d, MOD    105.0 ml A2C: LV vol d, MOD    81.7 ml       3D Volume EF: A4C:                           3D EF:        54 % LV vol s, MOD    46.9 ml       LV EDV:       128 ml A2C:                           LV ESV:       59 ml LV vol s, MOD  40.3 ml       LV SV:        69 ml A4C: LV SV MOD A2C:   58.1 ml LV SV MOD A4C:   81.7 ml LV SV MOD BP:    49.5 ml RIGHT VENTRICLE RV S prime:     8.27 cm/s TAPSE (M-mode): 2.0 cm LEFT ATRIUM           Index        RIGHT ATRIUM           Index LA diam:      4.90 cm 2.83 cm/m   RA Area:     31.30 cm LA Vol (A2C): 53.5 ml 30.92 ml/m  RA Volume:   124.00 ml 71.67 ml/m LA Vol (A4C): 67.1 ml 38.78 ml/m  AORTIC VALVE LVOT Vmax:   114.00 cm/s LVOT Vmean:  77.100 cm/s LVOT VTI:     0.192 m  AORTA Ao Root diam: 3.00 cm Ao Asc diam:  3.65 cm MITRAL VALVE                  TRICUSPID VALVE MV Area VTI:  1.96 cm        TR Peak grad:   38.2 mmHg MV Peak grad: 7.7 mmHg        TR Vmax:        309.00 cm/s MV Mean grad: 2.0 mmHg MV Vmax:      1.39 m/s        SHUNTS MV Vmean:     68.6 cm/s       Systemic VTI:  0.19 m MR Peak grad:    61.5 mmHg    Systemic Diam: 2.00 cm MR Mean grad:    41.0 mmHg MR Vmax:         392.00 cm/s MR Vmean:        299.0 cm/s MR PISA:         1.01 cm MR PISA Eff ROA: 10 mm MR PISA Radius:  0.40 cm Rudean Haskell MD Electronically signed by Rudean Haskell MD Signature Date/Time: 04/16/2022/2:44:24 PM    Final    DG Ankle Complete Right  Result Date: 04/16/2022 CLINICAL DATA:  Bimalleolar fracture. EXAM: RIGHT ANKLE - COMPLETE 3+ VIEW COMPARISON:  04/02/2022 FINDINGS: Ankle is imaged through splinting material. There is persistent oblique fracture of the MEDIAL and LATERAL malleolus. There is callus formation at both of the fracture sites. No change in displacement. IMPRESSION: Healing fractures of the MEDIAL and LATERAL malleolus. Electronically Signed   By: Nolon Nations M.D.   On: 04/16/2022 09:31    Scheduled Meds:  Chlorhexidine Gluconate Cloth  6 each Topical Daily   diltiazem  480 mg Oral Daily   ferrous gluconate  324 mg Oral BID WC   insulin aspart  0-5 Units Subcutaneous QHS   insulin aspart  0-9 Units Subcutaneous TID WC   insulin glargine  12 Units Subcutaneous QHS   metoprolol succinate  100 mg Oral q morning   polyethylene glycol  17 g Oral Daily   Rivaroxaban  15 mg Oral Q supper   senna  1 tablet Oral BID   tamsulosin  0.4 mg Oral Daily   Continuous Infusions:  cefTRIAXone (ROCEPHIN)  IV 2 g (04/16/22 1710)   diltiazem (CARDIZEM) infusion Stopped (04/16/22 1000)     LOS: 2 days   Darliss Cheney, MD [facility]  04/17/2022, 10:11 AM   *Please note that this is a verbal dictation therefore any  spelling or  grammatical errors are due to the "[employer]" system interpretation.  Please page via [place] and do not message via secure chat for urgent patient care matters. Secure chat can be used for non urgent patient care matters.  How to contact the [facility] Attending or Consulting provider [place] or covering provider during after hours [place], for this patient?  Check the care team in [facility] and look for a) attending/consulting [facility] provider listed and b) the [facility] team listed. Page or secure chat [phone]. Log into [email or web address] and use [place]'s universal password to access. If you do not have the password, please contact the hospital operator. Locate the [facility] provider you are looking for under [facility] and page to a number that you can be directly reached. If you still have difficulty reaching the provider, please page the [facility] (Director on Call) for the Hospitalists listed on amion for assistance.

## 2022-04-17 NOTE — Progress Notes (Signed)
Patient heart rate fluctuates from 115 to 158,patient remains asymptomatic,MD made aware.No any intervention advised.

## 2022-04-18 DIAGNOSIS — I4821 Permanent atrial fibrillation: Secondary | ICD-10-CM | POA: Diagnosis not present

## 2022-04-18 DIAGNOSIS — N10 Acute tubulo-interstitial nephritis: Secondary | ICD-10-CM | POA: Diagnosis not present

## 2022-04-18 LAB — GLUCOSE, CAPILLARY
Glucose-Capillary: 92 mg/dL (ref 70–99)
Glucose-Capillary: 131 mg/dL — ABNORMAL HIGH (ref 70–99)
Glucose-Capillary: 217 mg/dL — ABNORMAL HIGH (ref 70–99)
Glucose-Capillary: 188 mg/dL — ABNORMAL HIGH (ref 70–99)

## 2022-04-18 MED ORDER — TRAMADOL HCL 50 MG PO TABS: 50.0000 mg | ORAL_TABLET | Freq: Four times a day (QID) | ORAL | 0 refills | Status: AC | PRN

## 2022-04-18 MED ORDER — AMIODARONE HCL 200 MG PO TABS
200.0000 mg | ORAL_TABLET | Freq: Every day | ORAL | Status: DC
  Administered 2022-04-18 – 2022-04-19 (×2): 200 mg via ORAL
  Filled 2022-04-18 (×2): qty 1
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
## 2022-04-18 NOTE — Progress Notes (Signed)
Mobility Specialist Progress Note    04/18/22 1500  Mobility  Activity Transferred from bed to chair  Level of Assistance Other (Comment)  Assistive Device Stedy  RLE Weight Bearing NWB  Activity Response Tolerated well  Mobility Referral Yes  $Mobility charge 1 Mobility   Post-Mobility: 96 HR  Pt received and agreeable. No complaints. Left with call bell in reach.    Hildred Alamin Mobility Specialist  Please Psychologist, sport and exercise or Rehab Office at [phone]

## 2022-04-18 NOTE — Progress Notes (Addendum)
Pt experienced a 30 beat run of Vtach, BP 114/82, HR 110-140s, 98% O2 1LNC, Cardizem and metoprolol given at 0930. Asymptomatic. MD paged. Cardiology paged.  Daymon Larsen, RN

## 2022-04-18 NOTE — Progress Notes (Signed)
PROGRESS NOTE    Tara Bradley  GDJ:[id number] DOB: 1923-02-08 DOA: 04/14/2022 PCP: [name], MD   Brief Narrative:  HPI: Tara Bradley is a 88 y.o. female with medical history significant of chronic A-fib on Xarelto, insulin-dependent type 2 diabetes, hyperlipidemia, hypertension, chronic diastolic CHF, chronic hypoxemic respiratory failure on 4 L O2 as needed, history of breast cancer, aortic aneurysm with chronic descending dissection.  Admitted last month 10/26-10/30 for bimalleolar fracture of right ankle managed nonoperatively as patient refused surgical intervention.  Patient returns to the ED via EMS from her nursing facility for evaluation of abdominal pain/distention and vomiting in the setting of nonbloody diarrhea for the past few weeks.  Noted to be in A-fib with rate in the 140s.  Hypotensive with systolic as low as 62I, improved after fluid boluses.  Labs showing no leukocytosis, hemoglobin 9.6 (no significant change from baseline), sodium 132, chloride 92, BUN 49, creatinine 1.4 (baseline 0.8-1.0), calcium 8.5, albumin 2.5, lipase and LFTs normal, lactic acid normal, magnesium normal. Patient also noted to have acute urinary retention with bladder scan showing 2.3 L and Foley catheter was placed.  UA with large amount of leukocytes and microscopy showing >50 WBCs and many bacteria.  Urine culture pending.  CT abdomen pelvis showing findings compatible with right-sided pyelonephritis. Patient was given ceftriaxone, 2 L LR boluses, metoprolol, and started on Cardizem infusion.  TRH called to admit.   Patient states she was constipated a few weeks ago and was given laxatives at her facility and since then having ongoing diarrhea.  She is also endorsing right-sided abdominal pain and was vomiting earlier at her facility.  Denies any nausea at present and was able to tolerate p.o. intake in the ED.  Denies flank pain but endorsing dysuria for the past 2 weeks.  Denies fevers, cough,  shortness of breath, or chest pain.  Assessment & Plan:   Principal Problem:   Acute pyelonephritis Active Problems:   Bimalleolar fracture of right ankle   Chronic atrial fibrillation with RVR (HCC)   Chronic diastolic heart failure (HCC)   Chronic hypoxemic respiratory failure (HCC)   Chronic congestive heart failure with left ventricular diastolic dysfunction (HCC)   Acute urinary retention   AKI (acute kidney injury) ([place])   Diarrhea   QT prolongation   Insulin dependent type 2 diabetes mellitus (HCC)   Chronic anemia  Acute pyelonephritis: CT showing findings compatible with right-sided pyelonephritis.  No fever, leukocytosis, or lactic acidosis.  Clinically improving.  Continue Rocephin.  Chronic A-fib with RVR Likely precipitated by infection and dehydration in setting of vomiting/ ongoing diarrhea.  Patient received metoprolol in the ED and was started on Cardizem infusion.  Received 2 L LR boluses due to hypotension.  Cardiology following, they have transitioned her from IV diltiazem to home dose of oral Cardizem CD as well as beta-blocker on 04/16/2022, patient is on both high doses for beta-blocker and calcium blocker but rates still higher, seen by cardiology, added on amiodarone.  Also had an episode of 30 beats of V. tach.   AKI Acute urinary retention BUN 49, creatinine 1.4 (baseline 0.8-1.0).  Bladder scan done in the ED showing 2.3 L and Foley catheter was placed.  This is likely due to acute infection as well.  Creatinine improved to normal.  Continue home Flomax.  Avoid nephrotoxic agents.  Continue to hold diuretics and resume tomorrow.  Will DC Foley catheter.   Diarrhea No evidence of colitis on CT.  No fever  or leukocytosis.  Resolved.   QT prolongation Potassium and magnesium within normal range. -Cardiac monitoring -Avoid QT prolonging drugs if possible   Insulin-dependent type 2 diabetes Last A1c 8.6 in December 2022. -Repeat A1c -Continue home basal  insulin -Sensitive sliding scale insulin ACHS   Chronic diastolic CHF Appears euvolemic. -Hold Lasix and spironolactone given AKI   Chronic hypoxemic respiratory failure Uses 2 L of oxygen chronically at home, no change in oxygen requirement from baseline. -Continue to monitor   Chronic anemia Hemoglobin 9.6, no significant change from baseline. -Continue to monitor   Recent bimalleolar fracture of right ankle Patient had declined surgery and was managed nonoperatively.  Currently in a splint. -Continue pain management -Outpatient orthopedic follow-up  DVT prophylaxis: Xarelto   Code Status: DNR  Family Communication:  None present at bedside.  Plan of care discussed with patient in length and he/she verbalized understanding and agreed with it.  Status is: Inpatient Remains inpatient appropriate because: Still with uncontrolled atrial fibrillation    Estimated body mass index is 23.6 kg/m as calculated from the following:   Height as of this encounter: 5' 5.5" (1.664 m).   Weight as of this encounter: 65.3 kg.    Nutritional Assessment: Body mass index is 23.6 kg/m.[name] Seen by dietician.  I agree with the assessment and plan as outlined below: Nutrition Status:        . Skin Assessment: I have examined the patient's skin and I agree with the wound assessment as performed by the wound care RN as outlined below:    Consultants:  Cardiology  Procedures:  None  Antimicrobials:  Anti-infectives (From admission, onward)    Start     Dose/Rate Route Frequency Ordered Stop   04/16/22 1800  cefTRIAXone (ROCEPHIN) 2 g in sodium chloride 0.9 % 100 mL IVPB        2 g 200 mL/hr over 30 Minutes Intravenous Every 24 hours 04/16/22 0810     04/15/22 1800  cefTRIAXone (ROCEPHIN) 1 g in sodium chloride 0.9 % 100 mL IVPB  Status:  Discontinued        1 g 200 mL/hr over 30 Minutes Intravenous Every 24 hours 04/15/22 0226 04/16/22 0810   04/14/22 1900  cefTRIAXone (ROCEPHIN)  1 g in sodium chloride 0.9 % 100 mL IVPB        1 g 200 mL/hr over 30 Minutes Intravenous  Once 04/14/22 1853 04/14/22 2000         Subjective:  Patient seen and examined.  She says that she feels better today.  Objective: Vitals:   04/17/22 2338 04/18/22 0403 04/18/22 0930 04/18/22 1124  BP: 121/81 112/72 112/76 114/82  Pulse: 60 100 91 (!) 117  Resp: '18 20 20 20  '$ Temp: (!) 97.4 F (36.3 C) 98 F (36.7 C) 97.6 F (36.4 C) 97.9 F (36.6 C)  TempSrc: Oral Oral Oral Oral  SpO2: 97% 100% 93% 98%  Weight:      Height:        Intake/Output Summary (Last 24 hours) at 04/18/2022 1251 Last data filed at 04/18/2022 1237 Gross per 24 hour  Intake 1300 ml  Output 150 ml  Net 1150 ml    Filed Weights   04/14/22 1648  Weight: 65.3 kg    Examination:  General exam: Appears calm and comfortable  Respiratory system: Clear to auscultation. Respiratory effort normal. Cardiovascular system: S1 & S2 heard, irregularly irregular RR. No JVD, murmurs, rubs, gallops or clicks. No pedal edema. Gastrointestinal  system: Abdomen is nondistended, soft and nontender. No organomegaly or masses felt. Normal bowel sounds heard. Central nervous system: Alert and oriented. No focal neurological deficits. Skin: No rashes, lesions or ulcers.  Psychiatry: Judgement and insight appear normal. Mood & affect appropriate.   Data Reviewed: I have personally reviewed following labs and imaging studies  CBC: Recent Labs  Lab 04/14/22 1712 04/15/22 0327 04/16/22 0102  WBC 8.0 10.3 10.1  NEUTROABS 6.6  --  8.5*  HGB 9.6* 9.4* 9.4*  HCT 30.3* 28.8* 28.5*  MCV 92.7 91.7 91.1  PLT 298 293 814    Basic Metabolic Panel: Recent Labs  Lab 04/14/22 1712 04/15/22 0327 04/16/22 0102 04/17/22 0808  NA 132* 132* 135 137  K 4.7 3.6 3.9 4.1  CL 92* 90* 96* 97*  CO2 '28 29 29 31  '$ GLUCOSE 70 92 157* 84  BUN 49* 41* 32* 20  CREATININE 1.43* 1.22* 1.11* 0.91  CALCIUM 8.5* 8.7* 8.5* 8.6*  MG 2.1  --   1.9  --     GFR: Estimated Creatinine Clearance: 32.5 mL/min (by C-G formula based on SCr of 0.91 mg/dL). Liver Function Tests: Recent Labs  Lab 04/14/22 1712  AST 16  ALT 7  ALKPHOS 97  BILITOT 0.5  PROT 5.7*  ALBUMIN 2.5*    Recent Labs  Lab 04/14/22 1712  LIPASE 28    No results for input(s): "AMMONIA" in the last 168 hours. Coagulation Profile: Recent Labs  Lab 04/14/22 1712  INR 1.3*    Cardiac Enzymes: No results for input(s): "CKTOTAL", "CKMB", "CKMBINDEX", "TROPONINI" in the last 168 hours. BNP (last 3 results) No results for input(s): "PROBNP" in the last 8760 hours. HbA1C: No results for input(s): "HGBA1C" in the last 72 hours.  CBG: Recent Labs  Lab 04/17/22 1130 04/17/22 1620 04/17/22 2125 04/18/22 0614 04/18/22 1200  GLUCAP 180* 204* 235* 92 131*    Lipid Profile: No results for input(s): "CHOL", "HDL", "LDLCALC", "TRIG", "CHOLHDL", "LDLDIRECT" in the last 72 hours. Thyroid Function Tests: No results for input(s): "TSH", "T4TOTAL", "FREET4", "T3FREE", "THYROIDAB" in the last 72 hours.  Anemia Panel: No results for input(s): "VITAMINB12", "FOLATE", "FERRITIN", "TIBC", "IRON", "RETICCTPCT" in the last 72 hours. Sepsis Labs: Recent Labs  Lab 04/14/22 1712 04/15/22 0327  LATICACIDVEN 0.9 1.0     No results found for this or any previous visit (from the past 240 hour(s)).   Radiology Studies: ECHOCARDIOGRAM COMPLETE  Result Date: 04/16/2022    ECHOCARDIOGRAM REPORT   Patient Name:   AKIA MONTALBAN Date of Exam: 04/16/2022 Medical Rec #:  [id number]        Height:       65.5 in Accession #:    [id number]       Weight:       144.0 lb Date of Birth:  April 23, 1923         BSA:          1.730 m Patient Age:    81 years         BP:           98/60 mmHg Patient Gender: F                HR:           108 bpm. Exam Location:  Inpatient Procedure: 2D Echo, 3D Echo, Color Doppler and Cardiac Doppler Indications:    I48.91* Unspeicified atrial  fibrillation  History:        Patient has prior  history of Echocardiogram examinations, most                 recent 09/04/2021. CHF, Abnormal ECG, Arrythmias:Atrial                 Fibrillation and Bradycardia, Signs/Symptoms:Syncope and Chest                 Pain; Risk Factors:Hypertension, Diabetes and Dyslipidemia.                 Breast cancer.  Sonographer:    Roseanna Rainbow RDCS Referring Phys: Doreatha Martin [facility]  Sonographer Comments: Technically difficult study due to poor echo windows. Image acquisition challenging due to mastectomy and Image acquisition challenging due to uncooperative patient. Patient would not let me continue exam. IMPRESSIONS  1. Left ventricular ejection fraction, by estimation, is 50 to 55%. Left ventricular ejection fraction by 3D volume is 54 %. The left ventricle has low normal function. The left ventricle has no regional wall motion abnormalities. There is mild asymmetric left ventricular hypertrophy of the infero-lateral segment. Left ventricular diastolic parameters are indeterminate. There is the interventricular septum is flattened in systole and diastole, consistent with right ventricular pressure and volume overload= this is most prominent at the apex.  2. Right ventricular systolic function is mildly reduced. The right ventricular size is severely enlarged. There is moderately elevated pulmonary artery systolic pressure. The estimated right ventricular systolic pressure is 50.3 mmHg.  3. Left atrial size was severely dilated.  4. Right atrial size was severely dilated.  5. The mitral valve is degenerative. Mild to moderate mitral valve regurgitation. No evidence of mitral stenosis. The mean mitral valve gradient is 2.0 mmHg with average heart rate of 99 bpm. Moderate mitral annular calcification.  6. The aortic valve is tricuspid. There is mild calcification of the aortic valve. There is mild thickening of the aortic valve. Aortic valve regurgitation is not visualized. Aortic  valve sclerosis is present, with no evidence of aortic valve stenosis.  7. No subcostal images were able to be performed. Comparison(s): No significant change from prior study. FINDINGS  Left Ventricle: Left ventricular ejection fraction, by estimation, is 50 to 55%. Left ventricular ejection fraction by 3D volume is 54 %. The left ventricle has low normal function. The left ventricle has no regional wall motion abnormalities. The left ventricular internal cavity size was normal in size. There is mild asymmetric left ventricular hypertrophy of the infero-lateral segment. The interventricular septum is flattened in systole and diastole, consistent with right ventricular pressure and volume overload. Left ventricular diastolic parameters are indeterminate. Right Ventricle: The right ventricular size is severely enlarged. No increase in right ventricular wall thickness. Right ventricular systolic function is mildly reduced. There is moderately elevated pulmonary artery systolic pressure. The tricuspid regurgitant velocity is 3.09 m/s, and with an assumed right atrial pressure of 15 mmHg, the estimated right ventricular systolic pressure is 54.6 mmHg. Left Atrium: Left atrial size was severely dilated. Right Atrium: Right atrial size was severely dilated. Pericardium: There is no evidence of pericardial effusion. Mitral Valve: 2D MVA 5.27 cm2. The mitral valve is degenerative in appearance. Moderate mitral annular calcification. Mild to moderate mitral valve regurgitation. No evidence of mitral valve stenosis. MV peak gradient, 7.7 mmHg. The mean mitral valve gradient is 2.0 mmHg with average heart rate of 99 bpm. Tricuspid Valve: The tricuspid valve is grossly normal. Tricuspid valve regurgitation is mild . No evidence of tricuspid stenosis. Aortic Valve: The aortic valve is  tricuspid. There is mild calcification of the aortic valve. There is mild thickening of the aortic valve. There is mild aortic valve annular  calcification. Aortic valve regurgitation is not visualized. Aortic valve sclerosis is present, with no evidence of aortic valve stenosis. Pulmonic Valve: The pulmonic valve was not well visualized. Pulmonic valve regurgitation is not visualized. No evidence of pulmonic stenosis. Aorta: The aortic root and ascending aorta are structurally normal, with no evidence of dilitation. IAS/Shunts: No atrial level shunt detected by color flow Doppler.  LEFT VENTRICLE PLAX 2D LVIDd:         4.20 cm LVIDs:         2.80 cm LV PW:         1.20 cm         3D Volume EF LV IVS:        1.00 cm         LV 3D EF:    Left LVOT diam:     2.00 cm                      ventricul LV SV:         60                           ar LV SV Index:   35                           ejection LVOT Area:     3.14 cm                     fraction                                             by 3D                                             volume is LV Volumes (MOD)                            54 %. LV vol d, MOD    105.0 ml A2C: LV vol d, MOD    81.7 ml       3D Volume EF: A4C:                           3D EF:        54 % LV vol s, MOD    46.9 ml       LV EDV:       128 ml A2C:                           LV ESV:       59 ml LV vol s, MOD    40.3 ml       LV SV:        69 ml A4C: LV SV MOD A2C:   58.1 ml LV SV MOD A4C:   81.7 ml LV SV MOD BP:    49.5 ml RIGHT VENTRICLE  RV S prime:     8.27 cm/s TAPSE (M-mode): 2.0 cm LEFT ATRIUM           Index        RIGHT ATRIUM           Index LA diam:      4.90 cm 2.83 cm/m   RA Area:     31.30 cm LA Vol (A2C): 53.5 ml 30.92 ml/m  RA Volume:   124.00 ml 71.67 ml/m LA Vol (A4C): 67.1 ml 38.78 ml/m  AORTIC VALVE LVOT Vmax:   114.00 cm/s LVOT Vmean:  77.100 cm/s LVOT VTI:    0.192 m  AORTA Ao Root diam: 3.00 cm Ao Asc diam:  3.65 cm MITRAL VALVE                  TRICUSPID VALVE MV Area VTI:  1.96 cm        TR Peak grad:   38.2 mmHg MV Peak grad: 7.7 mmHg        TR Vmax:        309.00 cm/s MV Mean grad: 2.0 mmHg MV Vmax:       1.39 m/s        SHUNTS MV Vmean:     68.6 cm/s       Systemic VTI:  0.19 m MR Peak grad:    61.5 mmHg    Systemic Diam: 2.00 cm MR Mean grad:    41.0 mmHg MR Vmax:         392.00 cm/s MR Vmean:        299.0 cm/s MR PISA:         1.01 cm MR PISA Eff ROA: 10 mm MR PISA Radius:  0.40 cm Rudean Haskell MD Electronically signed by Rudean Haskell MD Signature Date/Time: 04/16/2022/2:44:24 PM    Final     Scheduled Meds:  amiodarone  200 mg Oral Daily   Chlorhexidine Gluconate Cloth  6 each Topical Daily   diltiazem  480 mg Oral Daily   ferrous gluconate  324 mg Oral BID WC   insulin aspart  0-5 Units Subcutaneous QHS   insulin aspart  0-9 Units Subcutaneous TID WC   insulin glargine  12 Units Subcutaneous QHS   metoprolol succinate  100 mg Oral q morning   polyethylene glycol  17 g Oral Daily   Rivaroxaban  15 mg Oral Q supper   senna  1 tablet Oral BID   tamsulosin  0.4 mg Oral Daily   Continuous Infusions:  cefTRIAXone (ROCEPHIN)  IV 2 g (04/17/22 1639)   diltiazem (CARDIZEM) infusion Stopped (04/16/22 1000)     LOS: 3 days   Darliss Cheney, MD [facility]  04/18/2022, 12:51 PM   *Please note that this is a verbal dictation therefore any spelling or grammatical errors are due to the "[employer]" system interpretation.  Please page via [place] and do not message via secure chat for urgent patient care matters. Secure chat can be used for non urgent patient care matters.  How to contact the [facility] Attending or Consulting provider [place] or covering provider during after hours [place], for this patient?  Check the care team in [facility] and look for a) attending/consulting [facility] provider listed and b) the [facility] team listed. Page or secure chat [phone]. Log into [email or web address] and use [place]'s universal password to access. If you do not have the password, please contact the hospital operator. Locate the [facility] provider you  are looking for under [facility] and page  to a number that you can be directly reached. If you still have difficulty reaching the provider, please page the [facility] (Director on Call) for the Hospitalists listed on amion for assistance.

## 2022-04-18 NOTE — Progress Notes (Signed)
Mobility Specialist Progress Note    04/18/22 1712  Mobility  Activity Transferred from chair to bed  Level of Assistance +2 (takes two people)  Assistive Device Stedy  RLE Weight Bearing NWB  Activity Response Tolerated well  Mobility Referral Yes  $Mobility charge 1 Mobility   No complaints. Left with call bell in reach.   Hildred Alamin Mobility Specialist  Please Psychologist, sport and exercise or Rehab Office at [phone]

## 2022-04-18 NOTE — Progress Notes (Signed)
Rounding Note    Patient Name: Tara Bradley Date of Encounter: 04/18/2022  [place] Cardiologist: [name], MD    Subjective    88 yo with hx of permanent Afib, AV sclerosis, HTN, HLD , chronic diastolic CHF  Hx of thrombosed descending aortic dissection   Admitted with Afib with RVR in the setting of pyelonephritis   Reportedly had a 30 second run of VT this am In review of the tele, this appears to be more c/w rapid Afib with aberrant conduction   She has frequent episodes of Afib with RVR whenever she has an illness.  On high dose cardizem and metoprolol  Will add amiodarone 200 mg a day  She is in no distress      Inpatient Medications    Scheduled Meds:  Chlorhexidine Gluconate Cloth  6 each Topical Daily   diltiazem  480 mg Oral Daily   ferrous gluconate  324 mg Oral BID WC   insulin aspart  0-5 Units Subcutaneous QHS   insulin aspart  0-9 Units Subcutaneous TID WC   insulin glargine  12 Units Subcutaneous QHS   metoprolol succinate  100 mg Oral q morning   polyethylene glycol  17 g Oral Daily   Rivaroxaban  15 mg Oral Q supper   senna  1 tablet Oral BID   tamsulosin  0.4 mg Oral Daily   Continuous Infusions:  cefTRIAXone (ROCEPHIN)  IV 2 g (04/17/22 1639)   diltiazem (CARDIZEM) infusion Stopped (04/16/22 1000)   PRN Meds: acetaminophen **OR** acetaminophen, mouth rinse   Vital Signs    Vitals:   04/17/22 2338 04/18/22 0403 04/18/22 0930 04/18/22 1124  BP: 121/81 112/72 112/76 114/82  Pulse: 60 100 91 (!) 117  Resp: '18 20 20 20  '$ Temp: (!) 97.4 F (36.3 C) 98 F (36.7 C) 97.6 F (36.4 C)   TempSrc: Oral Oral Oral   SpO2: 97% 100% 93% 98%  Weight:      Height:        Intake/Output Summary (Last 24 hours) at 04/18/2022 1158 Last data filed at 04/18/2022 0404 Gross per 24 hour  Intake 820 ml  Output 150 ml  Net 670 ml      04/14/2022    4:48 PM 03/19/2022   12:00 AM 02/15/2022   11:08 AM  Last 3 Weights   Weight (lbs) 144 lb 148 lb 13 oz 143 lb 12.8 oz  Weight (kg) 65.318 kg 67.5 kg 65.227 kg      Telemetry    Afib with RVR . 30 second episode of Afib with rapid and aberrant conduction  More consistent with Afib than VT   - Personally Reviewed  ECG     - Personally Reviewed  Physical Exam   GEN: elderly female,  NAD  Neck: No JVD Cardiac: irreg. Irreg.  Tachy  Respiratory: Clear to auscultation bilaterally. GI: Soft, nontender, non-distended  MS: No edema; No deformity. Neuro:  Nonfocal  Psych: Normal affect   Labs    High Sensitivity Troponin:  No results for input(s): "TROPONINIHS" in the last 720 hours.   Chemistry Recent Labs  Lab 04/14/22 1712 04/15/22 0327 04/16/22 0102 04/17/22 0808  NA 132* 132* 135 137  K 4.7 3.6 3.9 4.1  CL 92* 90* 96* 97*  CO2 '28 29 29 31  '$ GLUCOSE 70 92 157* 84  BUN 49* 41* 32* 20  CREATININE 1.43* 1.22* 1.11* 0.91  CALCIUM 8.5* 8.7* 8.5* 8.6*  MG 2.1  --  1.9  --   PROT 5.7*  --   --   --   ALBUMIN 2.5*  --   --   --   AST 16  --   --   --   ALT 7  --   --   --   ALKPHOS 97  --   --   --   BILITOT 0.5  --   --   --   GFRNONAA 33* 40* 45* 57*  ANIONGAP '12 13 10 9    '$ Lipids No results for input(s): "CHOL", "TRIG", "HDL", "LABVLDL", "LDLCALC", "CHOLHDL" in the last 168 hours.  Hematology Recent Labs  Lab 04/14/22 1712 04/15/22 0327 04/16/22 0102  WBC 8.0 10.3 10.1  RBC 3.27* 3.14* 3.13*  HGB 9.6* 9.4* 9.4*  HCT 30.3* 28.8* 28.5*  MCV 92.7 91.7 91.1  MCH 29.4 29.9 30.0  MCHC 31.7 32.6 33.0  RDW 16.0* 16.0* 16.1*  PLT 298 293 306   Thyroid  Recent Labs  Lab 04/15/22 0327  TSH 3.529    BNP Recent Labs  Lab 04/15/22 0327  BNP 750.3*    DDimer No results for input(s): "DDIMER" in the last 168 hours.   Radiology    ECHOCARDIOGRAM COMPLETE  Result Date: 04/16/2022    ECHOCARDIOGRAM REPORT   Patient Name:   Tara Bradley Date of Exam: 04/16/2022 Medical Rec #:  [id number]        Height:       65.5 in  Accession #:    [id number]       Weight:       144.0 lb Date of Birth:  January 05, 1923         BSA:          1.730 m Patient Age:    49 years         BP:           98/60 mmHg Patient Gender: F                HR:           108 bpm. Exam Location:  Inpatient Procedure: 2D Echo, 3D Echo, Color Doppler and Cardiac Doppler Indications:    I48.91* Unspeicified atrial fibrillation  History:        Patient has prior history of Echocardiogram examinations, most                 recent 09/04/2021. CHF, Abnormal ECG, Arrythmias:Atrial                 Fibrillation and Bradycardia, Signs/Symptoms:Syncope and Chest                 Pain; Risk Factors:Hypertension, Diabetes and Dyslipidemia.                 Breast cancer.  Sonographer:    Roseanna Rainbow RDCS Referring Phys: Doreatha Martin [facility]  Sonographer Comments: Technically difficult study due to poor echo windows. Image acquisition challenging due to mastectomy and Image acquisition challenging due to uncooperative patient. Patient would not let me continue exam. IMPRESSIONS  1. Left ventricular ejection fraction, by estimation, is 50 to 55%. Left ventricular ejection fraction by 3D volume is 54 %. The left ventricle has low normal function. The left ventricle has no regional wall motion abnormalities. There is mild asymmetric left ventricular hypertrophy of the infero-lateral segment. Left ventricular diastolic parameters are indeterminate. There is the interventricular septum is flattened in systole and diastole, consistent with right ventricular  pressure and volume overload= this is most prominent at the apex.  2. Right ventricular systolic function is mildly reduced. The right ventricular size is severely enlarged. There is moderately elevated pulmonary artery systolic pressure. The estimated right ventricular systolic pressure is 80.9 mmHg.  3. Left atrial size was severely dilated.  4. Right atrial size was severely dilated.  5. The mitral valve is degenerative. Mild to moderate  mitral valve regurgitation. No evidence of mitral stenosis. The mean mitral valve gradient is 2.0 mmHg with average heart rate of 99 bpm. Moderate mitral annular calcification.  6. The aortic valve is tricuspid. There is mild calcification of the aortic valve. There is mild thickening of the aortic valve. Aortic valve regurgitation is not visualized. Aortic valve sclerosis is present, with no evidence of aortic valve stenosis.  7. No subcostal images were able to be performed. Comparison(s): No significant change from prior study. FINDINGS  Left Ventricle: Left ventricular ejection fraction, by estimation, is 50 to 55%. Left ventricular ejection fraction by 3D volume is 54 %. The left ventricle has low normal function. The left ventricle has no regional wall motion abnormalities. The left ventricular internal cavity size was normal in size. There is mild asymmetric left ventricular hypertrophy of the infero-lateral segment. The interventricular septum is flattened in systole and diastole, consistent with right ventricular pressure and volume overload. Left ventricular diastolic parameters are indeterminate. Right Ventricle: The right ventricular size is severely enlarged. No increase in right ventricular wall thickness. Right ventricular systolic function is mildly reduced. There is moderately elevated pulmonary artery systolic pressure. The tricuspid regurgitant velocity is 3.09 m/s, and with an assumed right atrial pressure of 15 mmHg, the estimated right ventricular systolic pressure is 98.3 mmHg. Left Atrium: Left atrial size was severely dilated. Right Atrium: Right atrial size was severely dilated. Pericardium: There is no evidence of pericardial effusion. Mitral Valve: 2D MVA 5.27 cm2. The mitral valve is degenerative in appearance. Moderate mitral annular calcification. Mild to moderate mitral valve regurgitation. No evidence of mitral valve stenosis. MV peak gradient, 7.7 mmHg. The mean mitral valve  gradient is 2.0 mmHg with average heart rate of 99 bpm. Tricuspid Valve: The tricuspid valve is grossly normal. Tricuspid valve regurgitation is mild . No evidence of tricuspid stenosis. Aortic Valve: The aortic valve is tricuspid. There is mild calcification of the aortic valve. There is mild thickening of the aortic valve. There is mild aortic valve annular calcification. Aortic valve regurgitation is not visualized. Aortic valve sclerosis is present, with no evidence of aortic valve stenosis. Pulmonic Valve: The pulmonic valve was not well visualized. Pulmonic valve regurgitation is not visualized. No evidence of pulmonic stenosis. Aorta: The aortic root and ascending aorta are structurally normal, with no evidence of dilitation. IAS/Shunts: No atrial level shunt detected by color flow Doppler.  LEFT VENTRICLE PLAX 2D LVIDd:         4.20 cm LVIDs:         2.80 cm LV PW:         1.20 cm         3D Volume EF LV IVS:        1.00 cm         LV 3D EF:    Left LVOT diam:     2.00 cm                      ventricul LV SV:         60  ar LV SV Index:   35                           ejection LVOT Area:     3.14 cm                     fraction                                             by 3D                                             volume is LV Volumes (MOD)                            54 %. LV vol d, MOD    105.0 ml A2C: LV vol d, MOD    81.7 ml       3D Volume EF: A4C:                           3D EF:        54 % LV vol s, MOD    46.9 ml       LV EDV:       128 ml A2C:                           LV ESV:       59 ml LV vol s, MOD    40.3 ml       LV SV:        69 ml A4C: LV SV MOD A2C:   58.1 ml LV SV MOD A4C:   81.7 ml LV SV MOD BP:    49.5 ml RIGHT VENTRICLE RV S prime:     8.27 cm/s TAPSE (M-mode): 2.0 cm LEFT ATRIUM           Index        RIGHT ATRIUM           Index LA diam:      4.90 cm 2.83 cm/m   RA Area:     31.30 cm LA Vol (A2C): 53.5 ml 30.92 ml/m  RA Volume:   124.00 ml 71.67 ml/m  LA Vol (A4C): 67.1 ml 38.78 ml/m  AORTIC VALVE LVOT Vmax:   114.00 cm/s LVOT Vmean:  77.100 cm/s LVOT VTI:    0.192 m  AORTA Ao Root diam: 3.00 cm Ao Asc diam:  3.65 cm MITRAL VALVE                  TRICUSPID VALVE MV Area VTI:  1.96 cm        TR Peak grad:   38.2 mmHg MV Peak grad: 7.7 mmHg        TR Vmax:        309.00 cm/s MV Mean grad: 2.0 mmHg MV Vmax:      1.39 m/s        SHUNTS MV Vmean:     68.6 cm/s       Systemic VTI:  0.19 m MR  Peak grad:    61.5 mmHg    Systemic Diam: 2.00 cm MR Mean grad:    41.0 mmHg MR Vmax:         392.00 cm/s MR Vmean:        299.0 cm/s MR PISA:         1.01 cm MR PISA Eff ROA: 10 mm MR PISA Radius:  0.40 cm Rudean Haskell MD Electronically signed by Rudean Haskell MD Signature Date/Time: 04/16/2022/2:44:24 PM    Final     Cardiac Studies      Patient Profile     88 y.o. female   Assessment & Plan     Atrial fib:   with RVR .  Had rapid and aberrant conduction earlier this am   Not associated with an cp or worsening dyspnea  On xarelto Dilt 480 mg a day  Metoprolol 100 mg a day  Will add amiodarone 200 mg a day       For questions or updates, please contact [place] Please consult [email or web address] for contact info under        Signed, Mertie Moores, MD  04/18/2022, 11:58 AM

## 2022-04-19 ENCOUNTER — Ambulatory Visit: Payer: Medicare Other | Admitting: Orthopedic Surgery

## 2022-04-19 DIAGNOSIS — I4891 Unspecified atrial fibrillation: Secondary | ICD-10-CM

## 2022-04-19 DIAGNOSIS — N10 Acute tubulo-interstitial nephritis: Secondary | ICD-10-CM | POA: Diagnosis not present

## 2022-04-19 LAB — GLUCOSE, CAPILLARY
Glucose-Capillary: 63 mg/dL — ABNORMAL LOW (ref 70–99)
Glucose-Capillary: 244 mg/dL — ABNORMAL HIGH (ref 70–99)
Glucose-Capillary: 184 mg/dL — ABNORMAL HIGH (ref 70–99)
Glucose-Capillary: 175 mg/dL — ABNORMAL HIGH (ref 70–99)

## 2022-04-19 MED ORDER — INSULIN GLARGINE-YFGN 100 UNIT/ML ~~LOC~~ SOLN
12.0000 [IU] | Freq: Every day | SUBCUTANEOUS | Status: DC
  Administered 2022-04-19: 12 [IU] via SUBCUTANEOUS
  Filled 2022-04-19 (×2): qty 0.12

## 2022-04-19 MED ORDER — AMIODARONE HCL 200 MG PO TABS
200.0000 mg | ORAL_TABLET | Freq: Two times a day (BID) | ORAL | Status: DC
  Administered 2022-04-19 – 2022-04-20 (×2): 200 mg via ORAL
  Filled 2022-04-19 (×2): qty 1

## 2022-04-19 NOTE — Progress Notes (Signed)
PROGRESS NOTE    Tara Bradley  RJJ:[id number] DOB: April 12, 1923 DOA: 04/14/2022 PCP: [name], MD   Brief Narrative:  HPI: Tara Bradley is a 88 y.o. female with medical history significant of chronic A-fib on Xarelto, insulin-dependent type 2 diabetes, hyperlipidemia, hypertension, chronic diastolic CHF, chronic hypoxemic respiratory failure on 4 L O2 as needed, history of breast cancer, aortic aneurysm with chronic descending dissection.  Admitted last month 10/26-10/30 for bimalleolar fracture of right ankle managed nonoperatively as patient refused surgical intervention.  Patient returns to the ED via EMS from her nursing facility for evaluation of abdominal pain/distention and vomiting in the setting of nonbloody diarrhea for the past few weeks.  Noted to be in A-fib with rate in the 140s.  Hypotensive with systolic as low as 01S, improved after fluid boluses.  Labs showing no leukocytosis, hemoglobin 9.6 (no significant change from baseline), sodium 132, chloride 92, BUN 49, creatinine 1.4 (baseline 0.8-1.0), calcium 8.5, albumin 2.5, lipase and LFTs normal, lactic acid normal, magnesium normal. Patient also noted to have acute urinary retention with bladder scan showing 2.3 L and Foley catheter was placed.  UA with large amount of leukocytes and microscopy showing >50 WBCs and many bacteria.  Urine culture pending.  CT abdomen pelvis showing findings compatible with right-sided pyelonephritis. Patient was given ceftriaxone, 2 L LR boluses, metoprolol, and started on Cardizem infusion.  TRH called to admit.   Patient states she was constipated a few weeks ago and was given laxatives at her facility and since then having ongoing diarrhea.  She is also endorsing right-sided abdominal pain and was vomiting earlier at her facility.  Denies any nausea at present and was able to tolerate p.o. intake in the ED.  Denies flank pain but endorsing dysuria for the past 2 weeks.  Denies fevers, cough,  shortness of breath, or chest pain.  Assessment & Plan:   Principal Problem:   Acute pyelonephritis Active Problems:   Bimalleolar fracture of right ankle   Chronic atrial fibrillation with RVR (HCC)   Chronic diastolic heart failure (HCC)   Chronic hypoxemic respiratory failure (HCC)   Chronic congestive heart failure with left ventricular diastolic dysfunction (HCC)   Acute urinary retention   AKI (acute kidney injury) ([place])   Diarrhea   QT prolongation   Insulin dependent type 2 diabetes mellitus (HCC)   Chronic anemia  Acute pyelonephritis: CT showing findings compatible with right-sided pyelonephritis.  No fever, leukocytosis, or lactic acidosis.  No blood culture or urine culture were drawn in the ED.  Clinically improving.  Continue Rocephin.  Chronic A-fib with RVR Likely precipitated by infection and dehydration in setting of vomiting/ ongoing diarrhea.  Patient received metoprolol in the ED and was started on Cardizem infusion.  Received 2 L LR boluses due to hypotension.  Cardiology following, they have transitioned her from IV diltiazem to home dose of oral Cardizem CD as well as beta-blocker on 04/16/2022, patient is on both high doses for beta-blocker and calcium blocker but rates still higher, she was at on amiodarone by cardiology on 04/18/2022 but rates are still high, despite of that she is asymptomatic.   AKI Acute urinary retention BUN 49, creatinine 1.4 (baseline 0.8-1.0).  Bladder scan done in the ED showing 2.3 L and Foley catheter was placed.  This is likely due to acute infection as well.  Creatinine improved to normal.  Continue home Flomax.  Avoid nephrotoxic agents.  Foley catheter removed.   Diarrhea No evidence of  colitis on CT.  No fever or leukocytosis.  Resolved.   QT prolongation Potassium and magnesium within normal range. -Cardiac monitoring -Avoid QT prolonging drugs if possible   Insulin-dependent type 2 diabetes Last A1c 8.6 in December  2022. -Repeat A1c -Continue home basal insulin -Sensitive sliding scale insulin ACHS, for some reason, blood sugar was only 63 this morning but no symptoms.   Chronic diastolic CHF Appears euvolemic. -Lasix and Aldactone on hold, cardiology on board, will defer to them.   Chronic hypoxemic respiratory failure Uses 2 L of oxygen chronically at home, no change in oxygen requirement from baseline. -Continue to monitor   Chronic anemia Hemoglobin 9.6, no significant change from baseline. -Continue to monitor   Recent bimalleolar fracture of right ankle Patient had declined surgery and was managed nonoperatively.  Currently in a splint. -Continue pain management -Outpatient orthopedic follow-up  DVT prophylaxis: Xarelto   Code Status: DNR  Family Communication:  None present at bedside.  Plan of care discussed with patient in length and he/she verbalized understanding and agreed with it.  Status is: Inpatient Remains inpatient appropriate because: Still with uncontrolled atrial fibrillation    Estimated body mass index is 23.6 kg/m as calculated from the following:   Height as of this encounter: 5' 5.5" (1.664 m).   Weight as of this encounter: 65.3 kg.    Nutritional Assessment: Body mass index is 23.6 kg/m.[name] Seen by dietician.  I agree with the assessment and plan as outlined below: Nutrition Status:        . Skin Assessment: I have examined the patient's skin and I agree with the wound assessment as performed by the wound care RN as outlined below:    Consultants:  Cardiology  Procedures:  None  Antimicrobials:  Anti-infectives (From admission, onward)    Start     Dose/Rate Route Frequency Ordered Stop   04/16/22 1800  cefTRIAXone (ROCEPHIN) 2 g in sodium chloride 0.9 % 100 mL IVPB        2 g 200 mL/hr over 30 Minutes Intravenous Every 24 hours 04/16/22 0810     04/15/22 1800  cefTRIAXone (ROCEPHIN) 1 g in sodium chloride 0.9 % 100 mL IVPB  Status:   Discontinued        1 g 200 mL/hr over 30 Minutes Intravenous Every 24 hours 04/15/22 0226 04/16/22 0810   04/14/22 1900  cefTRIAXone (ROCEPHIN) 1 g in sodium chloride 0.9 % 100 mL IVPB        1 g 200 mL/hr over 30 Minutes Intravenous  Once 04/14/22 1853 04/14/22 2000         Subjective:  Patient seen and examined.  She has no specific complaint.  Objective: Vitals:   04/18/22 2100 04/19/22 0005 04/19/22 0339 04/19/22 0809  BP: 94/78 110/74 111/66 117/83  Pulse:  (!) 116 (!) 115 (!) 120  Resp: 19 (!) 24 (!) 22 20  Temp: 98 F (36.7 C) 98.6 F (37 C) 98.2 F (36.8 C) 97.9 F (36.6 C)  TempSrc: Oral Oral Oral Oral  SpO2:  98% 98% 99%  Weight:      Height:        Intake/Output Summary (Last 24 hours) at 04/19/2022 1033 Last data filed at 04/19/2022 0333 Gross per 24 hour  Intake 240 ml  Output 800 ml  Net -560 ml    Filed Weights   04/14/22 1648  Weight: 65.3 kg    Examination:  General exam: Appears calm and comfortable  Respiratory system:  Clear to auscultation. Respiratory effort normal. Cardiovascular system: S1 & S2 heard, irregularly irregular RR. No JVD, murmurs, rubs, gallops or clicks. No pedal edema. Gastrointestinal system: Abdomen is nondistended, soft and nontender. No organomegaly or masses felt. Normal bowel sounds heard. Central nervous system: Alert and oriented. No focal neurological deficits. Extremities: Symmetric 5 x 5 power. Skin: No rashes, lesions or ulcers.  Psychiatry: Judgement and insight appear normal. Mood & affect appropriate.   Data Reviewed: I have personally reviewed following labs and imaging studies  CBC: Recent Labs  Lab 04/14/22 1712 04/15/22 0327 04/16/22 0102  WBC 8.0 10.3 10.1  NEUTROABS 6.6  --  8.5*  HGB 9.6* 9.4* 9.4*  HCT 30.3* 28.8* 28.5*  MCV 92.7 91.7 91.1  PLT 298 293 102    Basic Metabolic Panel: Recent Labs  Lab 04/14/22 1712 04/15/22 0327 04/16/22 0102 04/17/22 0808  NA 132* 132* 135 137   K 4.7 3.6 3.9 4.1  CL 92* 90* 96* 97*  CO2 '28 29 29 31  '$ GLUCOSE 70 92 157* 84  BUN 49* 41* 32* 20  CREATININE 1.43* 1.22* 1.11* 0.91  CALCIUM 8.5* 8.7* 8.5* 8.6*  MG 2.1  --  1.9  --     GFR: Estimated Creatinine Clearance: 32.5 mL/min (by C-G formula based on SCr of 0.91 mg/dL). Liver Function Tests: Recent Labs  Lab 04/14/22 1712  AST 16  ALT 7  ALKPHOS 97  BILITOT 0.5  PROT 5.7*  ALBUMIN 2.5*    Recent Labs  Lab 04/14/22 1712  LIPASE 28    No results for input(s): "AMMONIA" in the last 168 hours. Coagulation Profile: Recent Labs  Lab 04/14/22 1712  INR 1.3*    Cardiac Enzymes: No results for input(s): "CKTOTAL", "CKMB", "CKMBINDEX", "TROPONINI" in the last 168 hours. BNP (last 3 results) No results for input(s): "PROBNP" in the last 8760 hours. HbA1C: No results for input(s): "HGBA1C" in the last 72 hours.  CBG: Recent Labs  Lab 04/18/22 0614 04/18/22 1200 04/18/22 1703 04/18/22 2106 04/19/22 0610  GLUCAP 92 131* 217* 188* 63*    Lipid Profile: No results for input(s): "CHOL", "HDL", "LDLCALC", "TRIG", "CHOLHDL", "LDLDIRECT" in the last 72 hours. Thyroid Function Tests: No results for input(s): "TSH", "T4TOTAL", "FREET4", "T3FREE", "THYROIDAB" in the last 72 hours.  Anemia Panel: No results for input(s): "VITAMINB12", "FOLATE", "FERRITIN", "TIBC", "IRON", "RETICCTPCT" in the last 72 hours. Sepsis Labs: Recent Labs  Lab 04/14/22 1712 04/15/22 0327  LATICACIDVEN 0.9 1.0     No results found for this or any previous visit (from the past 240 hour(s)).   Radiology Studies: No results found.  Scheduled Meds:  amiodarone  200 mg Oral Daily   Chlorhexidine Gluconate Cloth  6 each Topical Daily   diltiazem  480 mg Oral Daily   ferrous gluconate  324 mg Oral BID WC   insulin aspart  0-5 Units Subcutaneous QHS   insulin aspart  0-9 Units Subcutaneous TID WC   insulin glargine  12 Units Subcutaneous QHS   metoprolol succinate  100 mg Oral  q morning   polyethylene glycol  17 g Oral Daily   Rivaroxaban  15 mg Oral Q supper   senna  1 tablet Oral BID   tamsulosin  0.4 mg Oral Daily   Continuous Infusions:  cefTRIAXone (ROCEPHIN)  IV 2 g (04/18/22 1728)   diltiazem (CARDIZEM) infusion Stopped (04/16/22 1000)     LOS: 4 days   Darliss Cheney, MD [facility]  04/19/2022, 10:33 AM   *  Please note that this is a verbal dictation therefore any spelling or grammatical errors are due to the "[place]" system interpretation.  Please page via [place] and do not message via secure chat for urgent patient care matters. Secure chat can be used for non urgent patient care matters.  How to contact the [facility] Attending or Consulting provider [place] or covering provider during after hours [place], for this patient?  Check the care team in [facility] and look for a) attending/consulting [facility] provider listed and b) the [facility] team listed. Page or secure chat [phone]. Log into [email or web address] and use 's universal password to access. If you do not have the password, please contact the hospital operator. Locate the [facility] provider you are looking for under [facility] and page to a number that you can be directly reached. If you still have difficulty reaching the provider, please page the [facility] (Director on Call) for the Hospitalists listed on amion for assistance.

## 2022-04-19 NOTE — Progress Notes (Signed)
Mobility Specialist Progress Note:   04/19/22 1304  Mobility  Activity Transferred from chair to bed  Level of Assistance Minimal assist, patient does 75% or more (+2)  Assistive Device  (HHA)  Distance Ambulated (ft) 2 ft  Activity Response Tolerated well  $Mobility charge 1 Mobility   Pt received in chair asking to get back to bed. No complaints of pain. MinA+2 to stand. Left in bed with call bell in reach and all needs met.   Gareth Eagle Kalayla Shadden Mobility Specialist Please contact via [employer] or  Rehab Office at [phone]

## 2022-04-19 NOTE — Progress Notes (Signed)
Rounding Note    Patient Name: Tara Bradley Date of Encounter: 04/19/2022  [place] Cardiologist: [name], MD    Subjective    88 yo with hx of permanent Afib, AV sclerosis, HTN, HLD , chronic diastolic CHF  Hx of thrombosed descending aortic dissection   Admitted with Afib with RVR in the setting of pyelonephritis   Reportedly had a 30 second run of VT this am In review of the tele, this appears to be more c/w rapid Afib with aberrant conduction   She has frequent episodes of Afib with RVR whenever she has an illness.  She had 30 seconds of a wide complex rhyrhm yesterday .  Likely was Afib with RVR and aberrant conduction .  Cannot rule out NS VT . Amiodarone was added.   She is feeling well       Inpatient Medications    Scheduled Meds:  amiodarone  200 mg Oral Daily   Chlorhexidine Gluconate Cloth  6 each Topical Daily   diltiazem  480 mg Oral Daily   ferrous gluconate  324 mg Oral BID WC   insulin aspart  0-5 Units Subcutaneous QHS   insulin aspart  0-9 Units Subcutaneous TID WC   insulin glargine  12 Units Subcutaneous QHS   metoprolol succinate  100 mg Oral q morning   polyethylene glycol  17 g Oral Daily   Rivaroxaban  15 mg Oral Q supper   senna  1 tablet Oral BID   tamsulosin  0.4 mg Oral Daily   Continuous Infusions:  cefTRIAXone (ROCEPHIN)  IV 2 g (04/18/22 1728)   diltiazem (CARDIZEM) infusion Stopped (04/16/22 1000)   PRN Meds: acetaminophen **OR** acetaminophen, mouth rinse   Vital Signs    Vitals:   04/19/22 0005 04/19/22 0339 04/19/22 0809 04/19/22 1303  BP: 110/74 111/66 117/83 108/74  Pulse: (!) 116 (!) 115 (!) 120 99  Resp: (!) 24 (!) '22 20 20  '$ Temp: 98.6 F (37 C) 98.2 F (36.8 C) 97.9 F (36.6 C) 98.5 F (36.9 C)  TempSrc: Oral Oral Oral Oral  SpO2: 98% 98% 99% 98%  Weight:      Height:        Intake/Output Summary (Last 24 hours) at 04/19/2022 1323 Last data filed at 04/19/2022 1300 Gross per  24 hour  Intake 360 ml  Output 800 ml  Net -440 ml       04/14/2022    4:48 PM 03/19/2022   12:00 AM 02/15/2022   11:08 AM  Last 3 Weights  Weight (lbs) 144 lb 148 lb 13 oz 143 lb 12.8 oz  Weight (kg) 65.318 kg 67.5 kg 65.227 kg      Telemetry     Afib - HR is still rapid at times.  Generally better controlled.   ECG     - Personally Reviewed  Physical Exam   Physical Exam: Blood pressure 108/74, pulse 99, temperature 98.5 F (36.9 C), temperature source Oral, resp. rate 20, height 5' 5.5" (1.664 m), weight 65.3 kg, SpO2 98 %.       GEN:  eldelry female,   in no acute distress HEENT: Normal NECK: No JVD; No carotid bruits LYMPHATICS: No lymphadenopathy CARDIAC:  irreg. Irreg.  RESPIRATORY:  Clear to auscultation without rales, wheezing or rhonchi  ABDOMEN: Soft, non-tender, non-distended MUSCULOSKELETAL:  No edema; No deformity  SKIN: Warm and dry NEUROLOGIC:  Alert and oriented x 3   Labs    High Sensitivity Troponin:  No results for input(s): "TROPONINIHS" in the last 720 hours.   Chemistry Recent Labs  Lab 04/14/22 1712 04/15/22 0327 04/16/22 0102 04/17/22 0808  NA 132* 132* 135 137  K 4.7 3.6 3.9 4.1  CL 92* 90* 96* 97*  CO2 '28 29 29 31  '$ GLUCOSE 70 92 157* 84  BUN 49* 41* 32* 20  CREATININE 1.43* 1.22* 1.11* 0.91  CALCIUM 8.5* 8.7* 8.5* 8.6*  MG 2.1  --  1.9  --   PROT 5.7*  --   --   --   ALBUMIN 2.5*  --   --   --   AST 16  --   --   --   ALT 7  --   --   --   ALKPHOS 97  --   --   --   BILITOT 0.5  --   --   --   GFRNONAA 33* 40* 45* 57*  ANIONGAP '12 13 10 9     '$ Lipids No results for input(s): "CHOL", "TRIG", "HDL", "LABVLDL", "LDLCALC", "CHOLHDL" in the last 168 hours.  Hematology Recent Labs  Lab 04/14/22 1712 04/15/22 0327 04/16/22 0102  WBC 8.0 10.3 10.1  RBC 3.27* 3.14* 3.13*  HGB 9.6* 9.4* 9.4*  HCT 30.3* 28.8* 28.5*  MCV 92.7 91.7 91.1  MCH 29.4 29.9 30.0  MCHC 31.7 32.6 33.0  RDW 16.0* 16.0* 16.1*  PLT 298 293 306     Thyroid  Recent Labs  Lab 04/15/22 0327  TSH 3.529     BNP Recent Labs  Lab 04/15/22 0327  BNP 750.3*     DDimer No results for input(s): "DDIMER" in the last 168 hours.   Radiology    No results found.  Cardiac Studies      Patient Profile     88 y.o. female   Assessment & Plan     Atrial fib:   with RVR .  Had rapid and aberrant conduction earlier this am   Not associated with an cp or worsening dyspnea  On xarelto Dilt 480 mg a day  Metoprolol 100 mg a day  Amiodarone 200 mg QD was added yesterday  On Xarelto   She appears to be stable from a cardiac standpoint .       For questions or updates, please contact [place] Please consult [email or web address] for contact info under        Signed, Mertie Moores, MD  04/19/2022, 1:23 PM

## 2022-04-20 DIAGNOSIS — N10 Acute tubulo-interstitial nephritis: Secondary | ICD-10-CM | POA: Diagnosis not present

## 2022-04-20 DIAGNOSIS — I4821 Permanent atrial fibrillation: Secondary | ICD-10-CM | POA: Diagnosis not present

## 2022-04-20 LAB — GLUCOSE, CAPILLARY
Glucose-Capillary: 70 mg/dL (ref 70–99)
Glucose-Capillary: 106 mg/dL — ABNORMAL HIGH (ref 70–99)

## 2022-04-20 MED ORDER — AMIODARONE HCL 200 MG PO TABS: 200.0000 mg | ORAL_TABLET | Freq: Every day | ORAL | Status: DC

## 2022-04-20 MED ORDER — AMOXICILLIN-POT CLAVULANATE 875-125 MG PO TABS: 1.0000 | ORAL_TABLET | Freq: Two times a day (BID) | ORAL | 0 refills | Status: AC

## 2022-04-20 MED ORDER — AMIODARONE HCL 200 MG PO TABS: 200.0000 mg | ORAL_TABLET | Freq: Every day | ORAL | 0 refills | Status: AC

## 2022-04-20 NOTE — Progress Notes (Signed)
Rounding Note    Patient Name: Tara Bradley Date of Encounter: 04/20/2022  [place] Cardiologist: [name], MD    Subjective    88 yo with hx of permanent Afib, AV sclerosis, HTN, HLD , chronic diastolic CHF  Hx of thrombosed descending aortic dissection   Admitted with Afib with RVR in the setting of pyelonephritis   Reportedly had a 30 second run of VT this am In review of the tele, this appears to be more c/w rapid Afib with aberrant conduction   She has frequent episodes of Afib with RVR whenever she has an illness.  She had 30 seconds of a wide complex rhyrhm yesterday .  Likely was Afib with RVR and aberrant conduction .  Cannot rule out NS VT . Amiodarone was added.   She is feeling well   HR still on the hight side when she is excited Comes back down to the normal range when she is calm   No cp     Inpatient Medications    Scheduled Meds:  [START ON 04/21/2022] amiodarone  200 mg Oral Daily   Chlorhexidine Gluconate Cloth  6 each Topical Daily   diltiazem  480 mg Oral Daily   ferrous gluconate  324 mg Oral BID WC   insulin aspart  0-5 Units Subcutaneous QHS   insulin aspart  0-9 Units Subcutaneous TID WC   insulin glargine-yfgn  12 Units Subcutaneous QHS   metoprolol succinate  100 mg Oral q morning   polyethylene glycol  17 g Oral Daily   Rivaroxaban  15 mg Oral Q supper   senna  1 tablet Oral BID   tamsulosin  0.4 mg Oral Daily   Continuous Infusions:  cefTRIAXone (ROCEPHIN)  IV 2 g (04/19/22 1837)   diltiazem (CARDIZEM) infusion Stopped (04/16/22 1000)   PRN Meds: acetaminophen **OR** acetaminophen, mouth rinse   Vital Signs    Vitals:   04/19/22 2008 04/19/22 2329 04/20/22 0401 04/20/22 0749  BP:  112/83 105/77 105/75  Pulse: (!) 110 (!) 111 89 (!) 124  Resp: (!) '22 20 20 '$ (!) 23  Temp: 97.8 F (36.6 C) 97.9 F (36.6 C) 97.6 F (36.4 C) (!) 97.5 F (36.4 C)  TempSrc: Oral Oral Oral Oral  SpO2: 98% 97% 98% 92%   Weight:      Height:        Intake/Output Summary (Last 24 hours) at 04/20/2022 1100 Last data filed at 04/20/2022 0809 Gross per 24 hour  Intake 900 ml  Output 550 ml  Net 350 ml       04/14/2022    4:48 PM 03/19/2022   12:00 AM 02/15/2022   11:08 AM  Last 3 Weights  Weight (lbs) 144 lb 148 lb 13 oz 143 lb 12.8 oz  Weight (kg) 65.318 kg 67.5 kg 65.227 kg      Telemetry     Afib - HR is still rapid at times.  Generally better controlled.   ECG     - Personally Reviewed  Physical Exam    Physical Exam: Blood pressure 105/75, pulse (!) 124, temperature (!) 97.5 F (36.4 C), temperature source Oral, resp. rate (!) 23, height 5' 5.5" (1.664 m), weight 65.3 kg, SpO2 92 %.       GEN:  eldelry , frail female,   in no acute distress HEENT: Normal NECK: No JVD; No carotid bruits LYMPHATICS: No lymphadenopathy CARDIAC: irreg.. irreg  RESPIRATORY:  Clear to auscultation without rales, wheezing  or rhonchi  ABDOMEN: Soft, non-tender, non-distended MUSCULOSKELETAL:  No edema; No deformity  SKIN: Warm and dry NEUROLOGIC:  Alert and oriented x 3   Labs    High Sensitivity Troponin:  No results for input(s): "TROPONINIHS" in the last 720 hours.   Chemistry Recent Labs  Lab 04/14/22 1712 04/15/22 0327 04/16/22 0102 04/17/22 0808  NA 132* 132* 135 137  K 4.7 3.6 3.9 4.1  CL 92* 90* 96* 97*  CO2 '28 29 29 31  '$ GLUCOSE 70 92 157* 84  BUN 49* 41* 32* 20  CREATININE 1.43* 1.22* 1.11* 0.91  CALCIUM 8.5* 8.7* 8.5* 8.6*  MG 2.1  --  1.9  --   PROT 5.7*  --   --   --   ALBUMIN 2.5*  --   --   --   AST 16  --   --   --   ALT 7  --   --   --   ALKPHOS 97  --   --   --   BILITOT 0.5  --   --   --   GFRNONAA 33* 40* 45* 57*  ANIONGAP '12 13 10 9     '$ Lipids No results for input(s): "CHOL", "TRIG", "HDL", "LABVLDL", "LDLCALC", "CHOLHDL" in the last 168 hours.  Hematology Recent Labs  Lab 04/14/22 1712 04/15/22 0327 04/16/22 0102  WBC 8.0 10.3 10.1  RBC 3.27*  3.14* 3.13*  HGB 9.6* 9.4* 9.4*  HCT 30.3* 28.8* 28.5*  MCV 92.7 91.7 91.1  MCH 29.4 29.9 30.0  MCHC 31.7 32.6 33.0  RDW 16.0* 16.0* 16.1*  PLT 298 293 306    Thyroid  Recent Labs  Lab 04/15/22 0327  TSH 3.529     BNP Recent Labs  Lab 04/15/22 0327  BNP 750.3*     DDimer No results for input(s): "DDIMER" in the last 168 hours.   Radiology    No results found.  Cardiac Studies      Patient Profile     88 y.o. female   Assessment & Plan     Atrial fib:   with RVR .  Had rapid and aberrant conduction earlier this am   Not associated with an cp or worsening dyspnea  On xarelto Dilt 480 mg a day  Metoprolol 100 mg a day    On Xarelto    On amiodarone . She has fairly well controlled HR  Amio will help   Follow up with Dr. Percival Spanish or APP in 4-6 weeks         For questions or updates, please contact [place] Please consult [email or web address] for contact info under        Signed, Mertie Moores, MD  04/20/2022, 11:00 AM

## 2022-04-20 NOTE — Care Management Important Message (Signed)
Important Message  Patient Details  Name: Tara Bradley MRN: [id number] Date of Birth: [birth year]   Medicare Important Message Given:  Yes     [name] 04/20/2022, 11:34 AM

## 2022-04-20 NOTE — Discharge Summary (Signed)
Physician Discharge Summary  Tara Bradley NTZ:[id number] DOB: 1922/12/30 DOA: 04/14/2022  PCP: [name], MD  Admit date: 04/14/2022 Discharge date: 04/20/2022 30 Day Unplanned Readmission Risk Score    Flowsheet Row ED to Hosp-Admission (Current) from 04/14/2022 in [facility] 4E CV SURGICAL PROGRESSIVE CARE  30 Day Unplanned Readmission Risk Score (%) 28.72 Filed at 04/20/2022 0801       This score is the patient's risk of an unplanned readmission within 30 days of being discharged (0 -100%). The score is based on dignosis, age, lab data, medications, orders, and past utilization.   Low:  0-14.9   Medium: 15-21.9   High: 22-29.9   Extreme: 30 and above          Admitted From: SNF Disposition: SNF  Recommendations for Outpatient Follow-up:  Follow up with PCP in 1-2 weeks Please obtain BMP/CBC in one week Follow-up with your primary cardiologist Dr. Percival Spanish on 04/29/2022. Please follow up with your PCP on the following pending results: Unresulted Labs (From admission, onward)     Start     Ordered   04/15/22 0225  Gastrointestinal Panel by PCR , Stool  (Gastrointestinal Panel by PCR, Stool                                                                                                                                                     **Does Not include CLOSTRIDIUM DIFFICILE testing. **If CDIFF testing is needed, place order from the "C Difficile Testing" order set.**)  Once,   R        04/15/22 0226              Home Health: None Equipment/Devices: None  Discharge Condition: Stable CODE STATUS: DNR Diet recommendation: Cardiac  Subjective: Seen and examined feels well, she is eager to go back to nursing home.  She has no complaints.  Brief/Interim Summary:  Tara Bradley is a 88 y.o. female with medical history significant of chronic A-fib on Xarelto, insulin-dependent type 2 diabetes, hyperlipidemia, hypertension, chronic diastolic CHF, chronic hypoxemic  respiratory failure on 4 L O2 as needed, history of breast cancer, aortic aneurysm with chronic descending dissection.  Admitted last month 10/26-10/30 for bimalleolar fracture of right ankle managed nonoperatively as patient refused surgical intervention.  Patient returns to the ED via EMS from her nursing facility for evaluation of abdominal pain/distention and vomiting in the setting of nonbloody diarrhea for the past few weeks.  Noted to be in A-fib with rate in the 140s.  Hypotensive with systolic as low as 67R, improved after fluid boluses.  Labs showing no leukocytosis, hemoglobin 9.6 (no significant change from baseline), sodium 132, chloride 92, BUN 49, creatinine 1.4 (baseline 0.8-1.0), calcium 8.5, albumin 2.5, lipase and LFTs normal, lactic acid normal, magnesium normal. Patient also noted to have acute urinary retention with  bladder scan showing 2.3 L and Foley catheter was placed.  UA with large amount of leukocytes and microscopy showing >50 WBCs and many bacteria.  CT abdomen pelvis showing findings compatible with right-sided pyelonephritis. Patient was given ceftriaxone, 2 L LR boluses, metoprolol, and started on Cardizem infusion.  TR[facility] called to admit.  For some reason, neither blood nor urine culture were drawn in the ED.  Patient has clinically improved.  No fever.  She received 5 days of Rocephin.  Since she has QTc prolongation, unable to prescribe her Levaquin or Bactrim DS and I am limited with only Augmentin p.o. and I will prescribe it for 5 more days.   Chronic A-fib with RVR Likely precipitated by infection and dehydration in setting of vomiting/ ongoing diarrhea.  Patient received metoprolol in the ED and was started on Cardizem infusion.  Received 2 L LR boluses due to hypotension.  Cardiology following, they have transitioned her from IV diltiazem to home dose of oral Cardizem CD as well as beta-blocker on 04/16/2022, patient is on both high doses for beta-blocker and calcium  blocker but rates still higher, she was started on amiodarone by cardiology on 04/18/2022 but rates are still high at times when she is excited or when she talks however she is asymptomatic and thus cardiology has cleared her for discharge.   AKI Acute urinary retention BUN 49, creatinine 1.4 (baseline 0.8-1.0).  Bladder scan done in the ED showing 2.3 L and Foley catheter was placed.  This is likely due to acute infection as well.  Creatinine improved to normal.  Continue home Flomax.  Avoid nephrotoxic agents.  Foley catheter removed few days ago.   Diarrhea No evidence of colitis on CT.  No fever or leukocytosis.  Resolved.   QT prolongation Potassium and magnesium within normal range. -Cardiac monitoring -Avoid QT prolonging drugs if possible   Insulin-dependent type 2 diabetes Last A1c 8.6 in December 2022. Resume home medications.   Chronic diastolic CHF Appears euvolemic. Resume home indication/Aldactone and Lasix.   Chronic hypoxemic respiratory failure Uses 2 L of oxygen chronically at home, no change in oxygen requirement from baseline. -Continue to monitor   Chronic anemia Hemoglobin 9.6, no significant change from baseline. -Continue to monitor   Recent bimalleolar fracture of right ankle Patient had declined surgery and was managed nonoperatively.  Currently in a splint. -Continue pain management.  She had outpatient appointment with orthopedic but they saw her as inpatient as courtesy.  Orthopedics recommendations as below. Plan: -No acute operative intervention -Okay for diet from orthopedic perspective -Usually use ASA '325mg'$  BID for ankle fractures for dvt ppx, but will defer to primary -Weight bearing status: non-weight bearing right lower extremity -PT/OT evaluate and treat -Pain control -Follow up in my office in 2 weeks -Will consider splint removal at next visit if continues to show healing. Would transition to boot when out of bed and okay to be without  boot when in bed  Discharge plan was discussed with patient and/or family member and they verbalized understanding and agreed with it.  Discharge Diagnoses:  Principal Problem:   Acute pyelonephritis Active Problems:   Bimalleolar fracture of right ankle   Chronic atrial fibrillation with RVR (HCC)   Chronic diastolic heart failure (HCC)   Chronic hypoxemic respiratory failure (HCC)   Chronic congestive heart failure with left ventricular diastolic dysfunction (HCC)   Atrial fibrillation with RVR (HCC)   Acute urinary retention   AKI (acute kidney injury) ([place])  Diarrhea   QT prolongation   Insulin dependent type 2 diabetes mellitus (HCC)   Chronic anemia    Discharge Instructions   Allergies as of 04/20/2022       Reactions   Ambien [zolpidem] Other (See Comments)   Hallucinations- "Allergic," per [facility]   Codeine Other (See Comments)   Headache  and  "Allergic," per [facility]   Dulaglutide Diarrhea, Other (See Comments)    "Allergic," per [facility]   Latex Hives   Morphine Sulfate Nausea And Vomiting, Other (See Comments)    "Allergic," per [facility]   Mirtazapine Palpitations, Other (See Comments)    "Allergic," per [facility]        Medication List     STOP taking these medications    oxyCODONE 5 MG immediate release tablet Commonly known as: Roxicodone       TAKE these medications    acetaminophen 325 MG tablet Commonly known as: TYLENOL Take 650 mg by mouth every 6 (six) hours as needed (for pain).   amiodarone 200 MG tablet Commonly known as: PACERONE Take 1 tablet (200 mg total) by mouth daily. Start taking on: April 21, 2022   amoxicillin-clavulanate 875-125 MG tablet Commonly known as: AUGMENTIN Take 1 tablet by mouth 2 (two) times daily for 7 days.   BD Pen Needle Nano U/F 32G X 4 MM Misc Generic drug: Insulin Pen Needle Inject into the skin daily.   bisacodyl 10 MG suppository Commonly known as: DULCOLAX Place 10 mg rectally as needed for moderate  constipation (if no relief from Milk of Magnesia).   CALCIUM 600+D3 PO Take 1 tablet by mouth in the morning.   diltiazem 240 MG 24 hr capsule Commonly known as: CARDIZEM CD Take 2 capsules (480 mg total) by mouth daily.   Enema Enem Place 1 enema rectally daily as needed (for constipation not relieved by biscodyl suppository and contact MD for orders).   ferrous gluconate 324 MG tablet Commonly known as: FERGON Take 324 mg by mouth 2 (two) times daily with a meal.   furosemide 80 MG tablet Commonly known as: LASIX TAKE ONE TABLET TWICE DAILY AT 8am AND 9pm FOR edema/chf What changed: See the new instructions.   Imodium A-D 2 MG tablet Generic drug: loperamide Take 2 mg by mouth as needed for diarrhea or loose stools.   Insulin Glargine Solostar 100 UNIT/ML Solostar Pen Commonly known as: LANTUS Inject 12 Units into the skin at bedtime.   magnesium hydroxide 400 MG/5ML suspension Commonly known as: MILK OF MAGNESIA Take 30 mLs by mouth daily as needed for mild constipation.   metFORMIN 500 MG 24 hr tablet Commonly known as: GLUCOPHAGE-XR Take 1,000 mg by mouth at bedtime.   metoprolol succinate 100 MG 24 hr tablet Commonly known as: TOPROL-XL TAKE ONE TABLET BY MOUTH EVERY MORNING What changed: when to take this   NovoLOG FlexPen 100 UNIT/ML FlexPen Generic drug: insulin aspart Inject 0-9 Units into the skin See admin instructions. Inject 0-9 units into the skin 3 times a day, per sliding scale: BGL 70-120 = give nothing; 121-150 = 1 unit; 151-200 = 2 units; 201-250 = 3 units; 251-300 = 5 units; 301-350 = 7 units; 351- 400 = 9 units; >400 = call MD   OXYGEN Inhale 2 L/min into the lungs as needed (to maintain sats of 90%).   polyethylene glycol 17 g packet Commonly known as: MIRALAX / GLYCOLAX Take 17 g by mouth daily.   potassium chloride SA 20 MEQ tablet Commonly  known as: KLOR-CON M TAKE TWO TABLETS EVERY EVENING AT FIVE What changed: See the new  instructions.   Prevalon Misc Apply 1 Device topically See admin instructions. Apply Prevalon boot to the left foot while in bed and may remove for AM/PM care   senna 8.6 MG Tabs tablet Commonly known as: SENOKOT Take 1 tablet (8.6 mg total) by mouth 2 (two) times daily.   spironolactone 25 MG tablet Commonly known as: ALDACTONE Take 12.5 mg by mouth daily.   tamsulosin 0.4 MG Caps capsule Commonly known as: FLOMAX TAKE ONE CAPSULE BY MOUTH IN THE MORNING What changed: when to take this   traMADol 50 MG tablet Commonly known as: ULTRAM Take 1 tablet (50 mg total) by mouth every 6 (six) hours as needed (for pain).   Vitamin D3 125 MCG (5000 UT) Caps Take 5,000 Units by mouth daily.   Xarelto 15 MG Tabs tablet Generic drug: Rivaroxaban TAKE 1 TABLET ONCE DAILY WITH SUPPER. What changed: See the new instructions.        Follow-up Information     [name], MD Follow up in 1 week(s).   Specialty: Family Medicine Contact information: [address] [place]  [address] [phone]                Allergies  Allergen Reactions   Ambien [Zolpidem] Other (See Comments)    Hallucinations- "Allergic," per [facility]   Codeine Other (See Comments)    Headache  and  "Allergic," per [facility]    Dulaglutide Diarrhea and Other (See Comments)     "Allergic," per [facility]   Latex Hives   Morphine Sulfate Nausea And Vomiting and Other (See Comments)     "Allergic," per [facility]   Mirtazapine Palpitations and Other (See Comments)     "Allergic," per [facility]    Consultations: Cardiology and orthopedics   Procedures/Studies: ECHOCARDIOGRAM COMPLETE  Result Date: 04/16/2022    ECHOCARDIOGRAM REPORT   Patient Name:   ARADIA ESTEY Date of Exam: 04/16/2022 Medical Rec #:  [id number]        Height:       65.5 in Accession #:    [id number]       Weight:       144.0 lb Date of Birth:  1923-04-07         BSA:          1.730 m Patient Age:    52 years         BP:            98/60 mmHg Patient Gender: F                HR:           108 bpm. Exam Location:  Inpatient Procedure: 2D Echo, 3D Echo, Color Doppler and Cardiac Doppler Indications:    I48.91* Unspeicified atrial fibrillation  History:        Patient has prior history of Echocardiogram examinations, most                 recent 09/04/2021. CHF, Abnormal ECG, Arrythmias:Atrial                 Fibrillation and Bradycardia, Signs/Symptoms:Syncope and Chest                 Pain; Risk Factors:Hypertension, Diabetes and Dyslipidemia.                 Breast cancer.  Sonographer:  Roseanna Rainbow RDCS Referring Phys: Doreatha Martin [facility]  Sonographer Comments: Technically difficult study due to poor echo windows. Image acquisition challenging due to mastectomy and Image acquisition challenging due to uncooperative patient. Patient would not let me continue exam. IMPRESSIONS  1. Left ventricular ejection fraction, by estimation, is 50 to 55%. Left ventricular ejection fraction by 3D volume is 54 %. The left ventricle has low normal function. The left ventricle has no regional wall motion abnormalities. There is mild asymmetric left ventricular hypertrophy of the infero-lateral segment. Left ventricular diastolic parameters are indeterminate. There is the interventricular septum is flattened in systole and diastole, consistent with right ventricular pressure and volume overload= this is most prominent at the apex.  2. Right ventricular systolic function is mildly reduced. The right ventricular size is severely enlarged. There is moderately elevated pulmonary artery systolic pressure. The estimated right ventricular systolic pressure is 28.3 mmHg.  3. Left atrial size was severely dilated.  4. Right atrial size was severely dilated.  5. The mitral valve is degenerative. Mild to moderate mitral valve regurgitation. No evidence of mitral stenosis. The mean mitral valve gradient is 2.0 mmHg with average heart rate of 99 bpm. Moderate mitral  annular calcification.  6. The aortic valve is tricuspid. There is mild calcification of the aortic valve. There is mild thickening of the aortic valve. Aortic valve regurgitation is not visualized. Aortic valve sclerosis is present, with no evidence of aortic valve stenosis.  7. No subcostal images were able to be performed. Comparison(s): No significant change from prior study. FINDINGS  Left Ventricle: Left ventricular ejection fraction, by estimation, is 50 to 55%. Left ventricular ejection fraction by 3D volume is 54 %. The left ventricle has low normal function. The left ventricle has no regional wall motion abnormalities. The left ventricular internal cavity size was normal in size. There is mild asymmetric left ventricular hypertrophy of the infero-lateral segment. The interventricular septum is flattened in systole and diastole, consistent with right ventricular pressure and volume overload. Left ventricular diastolic parameters are indeterminate. Right Ventricle: The right ventricular size is severely enlarged. No increase in right ventricular wall thickness. Right ventricular systolic function is mildly reduced. There is moderately elevated pulmonary artery systolic pressure. The tricuspid regurgitant velocity is 3.09 m/s, and with an assumed right atrial pressure of 15 mmHg, the estimated right ventricular systolic pressure is 15.1 mmHg. Left Atrium: Left atrial size was severely dilated. Right Atrium: Right atrial size was severely dilated. Pericardium: There is no evidence of pericardial effusion. Mitral Valve: 2D MVA 5.27 cm2. The mitral valve is degenerative in appearance. Moderate mitral annular calcification. Mild to moderate mitral valve regurgitation. No evidence of mitral valve stenosis. MV peak gradient, 7.7 mmHg. The mean mitral valve gradient is 2.0 mmHg with average heart rate of 99 bpm. Tricuspid Valve: The tricuspid valve is grossly normal. Tricuspid valve regurgitation is mild . No  evidence of tricuspid stenosis. Aortic Valve: The aortic valve is tricuspid. There is mild calcification of the aortic valve. There is mild thickening of the aortic valve. There is mild aortic valve annular calcification. Aortic valve regurgitation is not visualized. Aortic valve sclerosis is present, with no evidence of aortic valve stenosis. Pulmonic Valve: The pulmonic valve was not well visualized. Pulmonic valve regurgitation is not visualized. No evidence of pulmonic stenosis. Aorta: The aortic root and ascending aorta are structurally normal, with no evidence of dilitation. IAS/Shunts: No atrial level shunt detected by color flow Doppler.  LEFT VENTRICLE PLAX 2D LVIDd:  4.20 cm LVIDs:         2.80 cm LV PW:         1.20 cm         3D Volume EF LV IVS:        1.00 cm         LV 3D EF:    Left LVOT diam:     2.00 cm                      ventricul LV SV:         60                           ar LV SV Index:   35                           ejection LVOT Area:     3.14 cm                     fraction                                             by 3D                                             volume is LV Volumes (MOD)                            54 %. LV vol d, MOD    105.0 ml A2C: LV vol d, MOD    81.7 ml       3D Volume EF: A4C:                           3D EF:        54 % LV vol s, MOD    46.9 ml       LV EDV:       128 ml A2C:                           LV ESV:       59 ml LV vol s, MOD    40.3 ml       LV SV:        69 ml A4C: LV SV MOD A2C:   58.1 ml LV SV MOD A4C:   81.7 ml LV SV MOD BP:    49.5 ml RIGHT VENTRICLE RV S prime:     8.27 cm/s TAPSE (M-mode): 2.0 cm LEFT ATRIUM           Index        RIGHT ATRIUM           Index LA diam:      4.90 cm 2.83 cm/m   RA Area:     31.30 cm LA Vol (A2C): 53.5 ml 30.92 ml/m  RA Volume:   124.00 ml 71.67 ml/m LA Vol (A4C): 67.1 ml 38.78 ml/m  AORTIC VALVE LVOT Vmax:   114.00 cm/s LVOT Vmean:  77.100 cm/s LVOT VTI:    0.192 m  AORTA Ao Root diam: 3.00 cm Ao Asc  diam:  3.65 cm MITRAL VALVE                  TRICUSPID VALVE MV Area VTI:  1.96 cm        TR Peak grad:   38.2 mmHg MV Peak grad: 7.7 mmHg        TR Vmax:        309.00 cm/s MV Mean grad: 2.0 mmHg MV Vmax:      1.39 m/s        SHUNTS MV Vmean:     68.6 cm/s       Systemic VTI:  0.19 m MR Peak grad:    61.5 mmHg    Systemic Diam: 2.00 cm MR Mean grad:    41.0 mmHg MR Vmax:         392.00 cm/s MR Vmean:        299.0 cm/s MR PISA:         1.01 cm MR PISA Eff ROA: 10 mm MR PISA Radius:  0.40 cm Rudean Haskell MD Electronically signed by Rudean Haskell MD Signature Date/Time: 04/16/2022/2:44:24 PM    Final    DG Ankle Complete Right  Result Date: 04/16/2022 CLINICAL DATA:  Bimalleolar fracture. EXAM: RIGHT ANKLE - COMPLETE 3+ VIEW COMPARISON:  04/02/2022 FINDINGS: Ankle is imaged through splinting material. There is persistent oblique fracture of the MEDIAL and LATERAL malleolus. There is callus formation at both of the fracture sites. No change in displacement. IMPRESSION: Healing fractures of the MEDIAL and LATERAL malleolus. Electronically Signed   By: Nolon Nations M.D.   On: 04/16/2022 09:31   DG CHEST PORT 1 VIEW  Result Date: 04/15/2022 CLINICAL DATA:  88 year old female with persistent diarrhea. EXAM: PORTABLE CHEST 1 VIEW COMPARISON:  CT Abdomen and Pelvis 04/14/2022. Chest radiographs 03/19/2022 and earlier. FINDINGS: Portable AP upright view at 0804 hours. Bilateral layering pleural effusions better demonstrated by CT yesterday, small. Stable cardiomegaly and mediastinal contours. Calcified aortic atherosclerosis. Pulmonary vascularity not significantly changed from last month, no overt edema. No pneumothorax. Chronic left rib fractures. Right shoulder arthroplasty. Moderate to severe scoliosis. Chronic postoperative changes to the left humerus. Chronic left axillary surgical clips. IMPRESSION: 1. Small bilateral pleural effusions with atelectasis better demonstrated by CT  yesterday. 2. No other acute cardiopulmonary abnormality. 3. Aortic Atherosclerosis (ICD10-I70.0). Electronically Signed   By: Genevie Ann M.D.   On: 04/15/2022 08:45   CT ABDOMEN PELVIS W CONTRAST  Result Date: 04/14/2022 CLINICAL DATA:  Abdominal pain, urinary retention EXAM: CT ABDOMEN AND PELVIS WITH CONTRAST TECHNIQUE: Multidetector CT imaging of the abdomen and pelvis was performed using the standard protocol following bolus administration of intravenous contrast. RADIATION DOSE REDUCTION: This exam was performed according to the departmental dose-optimization program which includes automated exposure control, adjustment of the mA and/or kV according to patient size and/or use of iterative reconstruction technique. CONTRAST:  30m OMNIPAQUE IOHEXOL 350 MG/ML SOLN COMPARISON:  09/28/2016 FINDINGS: Lower chest: Small bilateral pleural effusions. Compressive atelectasis in the lower lobes. Diffuse low-density throughout the liver suggesting fatty infiltration. Peripheral cyst in the right hepatic lobe measures 2 cm. Small layering gallstones within the gallbladder. Hepatobiliary: No focal abnormality or ductal dilatation. Pancreas: No focal abnormality.  Normal size. Spleen: No focal abnormality.  Normal size. Adrenals/Urinary Tract: Numerous bilateral small renal cysts, the largest in the right midpole measuring 16 mm. No follow-up imaging  recommended. No hydronephrosis. Areas of decreased enhancement with striations in the right kidney compatible with pyelonephritis. Adrenal glands unremarkable. Foley catheter present in the bladder. Stomach/Bowel: Few scattered sigmoid diverticula. No active diverticulitis. Stomach and small bowel decompressed, grossly unremarkable. Vascular/Lymphatic: Aortic atherosclerosis. No evidence of aneurysm or adenopathy. Reproductive: Prior hysterectomy.  No adnexal masses. Other: Trace free fluid in the pelvis.  No free air. Musculoskeletal: No acute bony abnormality. IMPRESSION:  Areas of decreased enhancement and striations within the right kidney compatible with pyelonephritis. Cholelithiasis. Aortic atherosclerosis. Electronically Signed   By: Rolm Baptise M.D.   On: 04/14/2022 21:14   DG Ankle 2 Views Right  Result Date: 03/22/2022 CLINICAL DATA:  Evaluate fracture alignment prior to discharge. EXAM: RIGHT ANKLE - 2 VIEW COMPARISON:  Right ankle radiographs 03/18/2022 and right tibia and fibula 03/18/2022 FINDINGS: Overlying cast material again limits evaluation of fine bony detail. There is again an oblique fracture of the distal fibular diaphysis with up to 2 mm lateral displacement of the distal fracture component respect of the proximal fracture component, unchanged from prior. There is again mild relative widening of the medial tibiotalar clear space, measuring up to approximately 5 mm and unchanged from prior. Subtle lucency within the mid to superomedial malleolus indicating an acute nondisplaced fracture, with now apparent improved anatomic alignment compared to prior. Mild distal medial and lateral malleolar degenerative osteophytosis. Small plantar calcaneal heel spur. Chronic mineralization throughout the proximal plantar fascia. IMPRESSION: 1. Unchanged minimally displaced alignment of the distal fibular oblique fracture. 2. Acute nondisplaced medial malleolar fracture with the prior mild displacement improved from most recent 03/18/2022 radiographs. 3. Unchanged mild asymmetric widening of the medial tibiotalar clear space. Electronically Signed   By: Yvonne Kendall M.D.   On: 03/22/2022 12:52     Discharge Exam: Vitals:   04/20/22 0401 04/20/22 0749  BP: 105/77 105/75  Pulse: 89 (!) 124  Resp: 20 (!) 23  Temp: 97.6 F (36.4 C) (!) 97.5 F (36.4 C)  SpO2: 98% 92%   Vitals:   04/19/22 2008 04/19/22 2329 04/20/22 0401 04/20/22 0749  BP:  112/83 105/77 105/75  Pulse: (!) 110 (!) 111 89 (!) 124  Resp: (!) '22 20 20 '$ (!) 23  Temp: 97.8 F (36.6 C) 97.9 F  (36.6 C) 97.6 F (36.4 C) (!) 97.5 F (36.4 C)  TempSrc: Oral Oral Oral Oral  SpO2: 98% 97% 98% 92%  Weight:      Height:        General: Pt is alert, awake, not in acute distress Cardiovascular: Irregularly irregular RR, S1/S2 +, no rubs, no gallops Respiratory: CTA bilaterally, no wheezing, no rhonchi Abdominal: Soft, NT, ND, bowel sounds + Extremities: no edema, no cyanosis    The results of significant diagnostics from this hospitalization (including imaging, microbiology, ancillary and laboratory) are listed below for reference.     Microbiology: No results found for this or any previous visit (from the past 240 hour(s)).   Labs: BNP (last 3 results) Recent Labs    09/29/21 1104 03/19/22 0024 04/15/22 0327  BNP 380.1* 843.9* 706.2*   Basic Metabolic Panel: Recent Labs  Lab 04/14/22 1712 04/15/22 0327 04/16/22 0102 04/17/22 0808  NA 132* 132* 135 137  K 4.7 3.6 3.9 4.1  CL 92* 90* 96* 97*  CO2 '28 29 29 31  '$ GLUCOSE 70 92 157* 84  BUN 49* 41* 32* 20  CREATININE 1.43* 1.22* 1.11* 0.91  CALCIUM 8.5* 8.7* 8.5* 8.6*  MG 2.1  --  1.9  --    Liver Function Tests: Recent Labs  Lab 04/14/22 1712  AST 16  ALT 7  ALKPHOS 97  BILITOT 0.5  PROT 5.7*  ALBUMIN 2.5*   Recent Labs  Lab 04/14/22 1712  LIPASE 28   No results for input(s): "AMMONIA" in the last 168 hours. CBC: Recent Labs  Lab 04/14/22 1712 04/15/22 0327 04/16/22 0102  WBC 8.0 10.3 10.1  NEUTROABS 6.6  --  8.5*  HGB 9.6* 9.4* 9.4*  HCT 30.3* 28.8* 28.5*  MCV 92.7 91.7 91.1  PLT 298 293 306   Cardiac Enzymes: No results for input(s): "CKTOTAL", "CKMB", "CKMBINDEX", "TROPONINI" in the last 168 hours. BNP: Invalid input(s): "POCBNP" CBG: Recent Labs  Lab 04/19/22 0610 04/19/22 1316 04/19/22 1616 04/19/22 2141 04/20/22 0629  GLUCAP 63* 244* 184* 175* 70   D-Dimer No results for input(s): "DDIMER" in the last 72 hours. Hgb A1c No results for input(s): "HGBA1C" in the last 72  hours. Lipid Profile No results for input(s): "CHOL", "HDL", "LDLCALC", "TRIG", "CHOLHDL", "LDLDIRECT" in the last 72 hours. Thyroid function studies No results for input(s): "TSH", "T4TOTAL", "T3FREE", "THYROIDAB" in the last 72 hours.  Invalid input(s): "FREET3" Anemia work up No results for input(s): "VITAMINB12", "FOLATE", "FERRITIN", "TIBC", "IRON", "RETICCTPCT" in the last 72 hours. Urinalysis    Component Value Date/Time   COLORURINE YELLOW 04/14/2022 1736   APPEARANCEUR CLOUDY (A) 04/14/2022 1736   LABSPEC 1.006 04/14/2022 1736   PHURINE 8.0 04/14/2022 1736   GLUCOSEU NEGATIVE 04/14/2022 1736   HGBUR SMALL (A) 04/14/2022 1736   BILIRUBINUR NEGATIVE 04/14/2022 1736   BILIRUBINUR n 07/23/2013 1343   KETONESUR NEGATIVE 04/14/2022 1736   PROTEINUR 30 (A) 04/14/2022 1736   UROBILINOGEN 0.2 07/23/2013 1343   UROBILINOGEN 0.2 03/07/2012 2123   NITRITE NEGATIVE 04/14/2022 1736   LEUKOCYTESUR LARGE (A) 04/14/2022 1736   Sepsis Labs Recent Labs  Lab 04/14/22 1712 04/15/22 0327 04/16/22 0102  WBC 8.0 10.3 10.1   Microbiology No results found for this or any previous visit (from the past 240 hour(s)).   Time coordinating discharge: Over 30 minutes  SIGNED:   Darliss Cheney, MD  [facility] 04/20/2022, 11:16 AM *Please note that this is a verbal dictation therefore any spelling or grammatical errors are due to the "[place] [employer]" system interpretation. If 7PM-7AM, please contact night-coverage [email or web address]

## 2022-04-20 NOTE — TOC Transition Note (Signed)
Transition of Care [facility]) - CM/SW Discharge Note   Patient Details  Name: Tara Bradley MRN: [id number] Date of Birth: February 06, 1923  [facility] CM/SW Contact:  Vinie Sill, LCSW Phone Number: 04/20/2022, 12:19 PM   Clinical Narrative:     Patient will Discharge to: [place] Discharge Date: 11/28/20223 Family Notified: Nephew Transport By: Corey Harold  Per MD patient is ready for discharge. RN, patient, and facility notified of discharge. Discharge Summary sent to facility. RN given number for [phone]. Ambulance transport requested for patient.   Clinical Social Worker signing off.  Thurmond Butts, MSW, LCSW Clinical Social Worker     Final next level of care: Skilled Nursing Facility Barriers to Discharge: Barriers Resolved   Patient Goals and CMS Choice        Discharge Placement              Patient chooses bed at: [facility] Patient to be transferred to facility by: [name] Name of family member notified: nephew Patient and family notified of of transfer: 04/20/22  Discharge Plan and Services                                     Social Determinants of Health (SDOH) Interventions     Readmission Risk Interventions    05/11/2021    1:11 PM  Readmission Risk Prevention Plan  Transportation Screening Complete  Home Care Screening Complete

## 2022-04-28 ENCOUNTER — Telehealth: Payer: Self-pay | Admitting: Orthopedic Surgery

## 2022-04-28 ENCOUNTER — Ambulatory Visit: Payer: Medicare Other | Admitting: Podiatry

## 2022-04-28 NOTE — Telephone Encounter (Signed)
Please call Leanord Hawking- patients nephew and needs to speak to someone ASAP about an appt with Dr. Laurance Flatten, patient was told Laurance Flatten that patient need to have a xray and follow up on Friday or Monday. I am only available to schedule 98mn on Monday at 4:15. Please call and speak to him and book the appt...[phone]

## 2022-04-28 NOTE — Telephone Encounter (Signed)
I called and advised that Monday @ 4:15 will be fine to see her.

## 2022-04-28 NOTE — Progress Notes (Unsigned)
Cardiology Office Note   Date:  04/29/2022   ID:  Tara Bradley, DOB 1923/02/10, MRN [id number]  PCP:  [name], MD  Cardiologist: Dr. Percival Spanish, 08/15/2017 [name], MD 05/26/2017   Chief Complaint  Patient presents with   Atrial Fibrillation     History of Present Illness: Tara Bradley is a 88 y.o. female with a history of an aortic thrombosed descending aortic dissection, aortic valve sclerosis and chronic afib on Xarelto.  She has chronic diastolic heart failure.  She has chronic atrial fib.  She has significant debility status post bimalleolar fracture managed without surgery earlier this year.  She had rapid rate with her atrial fib.  This was when she presented with UTI and urinary retention.  She was treated with amiodarone for rate control.    She is now at rehab and she is about to start weightbearing.  I do not have any recent blood pressures or heart rates.  She does not feel her heart racing and has never felt her fibrillation.  She is not having any presyncope or syncope.  She denies any chest pressure, neck or arm discomfort.  She is on chronic 2 L of oxygen and does not feel short of breath.  She does report that her urinary symptoms have resolved.   Past Medical History:  Diagnosis Date   Aortic valve sclerosis    Echo, 2008   Arthritis    Bradycardia    October, 2012   Cancer [facility])    Chronic atrial fibrillation (HCC)    Chronic diastolic CHF (congestive heart failure) ([place])    Chronic insomnia    Closed fracture of unspecified part of femur 2005   Diabetes mellitus, type 2 ([place])    Diverticulitis    Diverticulosis    Hyperlipidemia    Hypertension    Long term (current) use of anticoagulants    Osteopenia    Osteoporosis    femur fracture 2005, pelvic fracture 2006   Personal history of malignant neoplasm of breast    Pneumonia    Syncope    Thoracic aortic aneurysm ([place]) 02/2017   chronic descending aorta dissection    Unspecified closed fracture of pelvis 2006    Past Surgical History:  Procedure Laterality Date   CARDIOVERSION N/A 08/09/2012   Procedure: CARDIOVERSION;  Surgeon: Deboraha Sprang, MD;  Location: [place];  Service: Cardiovascular;  Laterality: N/A;   EYE SURGERY     FEMUR SURGERY  2005   ORIF   INTRAMEDULLARY (IM) NAIL INTERTROCHANTERIC Left 08/02/2021   Procedure: INTRAMEDULLARY (IM) NAIL INTERTROCHANTRIC;  Surgeon: Willaim Sheng, MD;  Location: [place];  Service: Orthopedics;  Laterality: Left;   IR ANGIOGRAM PELVIS SELECTIVE OR SUPRASELECTIVE  03/11/2017   IR ANGIOGRAM SELECTIVE EACH ADDITIONAL VESSEL  03/11/2017   IR EMBO ART  VEN HEMORR LYMPH EXTRAV  INC GUIDE ROADMAPPING  03/11/2017   IR FLUORO GUIDE CV LINE RIGHT  03/11/2017   IR US GUIDE VASC ACCESS RIGHT  03/11/2017   IR US GUIDE VASC ACCESS RIGHT  03/11/2017   MASTECTOMY  1995   left   MINOR HARDWARE REMOVAL Left 09/01/2020   Procedure: REMOVAL OF HARDWARE LEFT FOREARM;  Surgeon: Milly Jakob, MD;  Location: [place];  Service: Orthopedics;  Laterality: Left;   ORIF ULNAR FRACTURE Left 10/19/2019   Procedure: OPEN REDUCTION INTERNAL FIXATION (ORIF)  LEFT DISTAL RADIUS  AND ULNA FRACTURE;  Surgeon: Milly Jakob, MD;  Location: [facility];  Service: Orthopedics;  Laterality: Left;   REVERSE SHOULDER ARTHROPLASTY Right 05/04/2021   Procedure: REVERSE SHOULDER ARTHROPLASTY;  Surgeon: Netta Cedars, MD;  Location: [place];  Service: Orthopedics;  Laterality: Right;   SHOULDER SURGERY     left   SHOULDER SURGERY  1979, 2003   TONSILLECTOMY     TOTAL ABDOMINAL HYSTERECTOMY W/ BILATERAL SALPINGOOPHORECTOMY  1995   WRIST SURGERY      x 2     Prior to Admission medications   Medication Sig Start Date End Date Taking? Authorizing Provider  amiodarone (PACERONE) 200 MG tablet Take 1 tablet (200 mg total) by mouth daily. 04/21/22 05/21/22 Yes Pahwani, Einar Grad, MD  BD PEN NEEDLE NANO U/F 32G X 4 MM MISC Inject  into the skin daily. 11/25/20  Yes [provider]  Calcium Carb-Cholecalciferol (CALCIUM 600+D3 PO) Take 1 tablet by mouth in the morning.   Yes [provider]  Cholecalciferol (VITAMIN D3) 125 MCG (5000 UT) CAPS Take 5,000 Units by mouth daily.   Yes [provider]  diltiazem (CARDIZEM CD) 240 MG 24 hr capsule Take 2 capsules (480 mg total) by mouth daily. 03/23/22 04/29/22 Yes Pahwani, Einar Grad, MD  ferrous gluconate (FERGON) 324 MG tablet Take 324 mg by mouth 2 (two) times daily with a meal.   Yes [provider]  [place] (PREVALON) MISC Apply 1 Device topically See admin instructions. Apply Prevalon boot to the left foot while in bed and may remove for AM/PM care   Yes [provider]  furosemide (LASIX) 80 MG tablet TAKE ONE TABLET TWICE DAILY AT 8am AND 9pm FOR edema/chf Patient taking differently: Take 80 mg by mouth See admin instructions. Take 80 mg by mouth at 10 AM and 5 PM 12/22/21  Yes [name], MD  Insulin Glargine Solostar (LANTUS) 100 UNIT/ML Solostar Pen Inject 12 Units into the skin at bedtime.   Yes [provider]  metFORMIN (GLUCOPHAGE-XR) 500 MG 24 hr tablet Take 1,000 mg by mouth at bedtime.   Yes [provider]  metoprolol succinate (TOPROL-XL) 100 MG 24 hr tablet TAKE ONE TABLET BY MOUTH EVERY MORNING Patient taking differently: Take 100 mg by mouth daily. 03/10/22  Yes Mikeya Tomasetti, Jeneen Rinks, MD  NOVOLOG FLEXPEN 100 UNIT/ML FlexPen Inject 0-9 Units into the skin See admin instructions. Inject 0-9 units into the skin 3 times a day, per sliding scale: BGL 70-120 = give nothing; 121-150 = 1 unit; 151-200 = 2 units; 201-250 = 3 units; 251-300 = 5 units; 301-350 = 7 units; 351- 400 = 9 units; >400 = call MD   Yes [provider]  OXYGEN Inhale 2 L/min into the lungs as needed (to maintain sats of 90%).   Yes [provider]  polyethylene glycol (MIRALAX / GLYCOLAX) 17 g packet Take 17 g by mouth  daily. 05/12/21  Yes Adhikari, Tamsen Meek, MD  potassium chloride SA (KLOR-CON M) 20 MEQ tablet TAKE TWO TABLETS EVERY EVENING AT FIVE 12/22/21  Yes [name], MD  senna (SENOKOT) 8.6 MG TABS tablet Take 1 tablet (8.6 mg total) by mouth 2 (two) times daily. 08/07/21  Yes Shelly Coss, MD  spironolactone (ALDACTONE) 25 MG tablet Take 12.5 mg by mouth daily.   Yes [provider]  tamsulosin (FLOMAX) 0.4 MG CAPS capsule TAKE ONE CAPSULE BY MOUTH IN THE MORNING Patient taking differently: Take 0.4 mg by mouth daily. 01/29/22  Yes [name], MD  XARELTO 15 MG TABS tablet TAKE 1 TABLET  ONCE DAILY WITH SUPPER. Patient taking differently: Take 15 mg by mouth daily with supper. 06/22/21  Yes [name], MD  acetaminophen (TYLENOL) 325 MG tablet Take 650 mg by mouth every 6 (six) hours as needed (for pain).    [provider]  bisacodyl (DULCOLAX) 10 MG suppository Place 10 mg rectally as needed for moderate constipation (if no relief from Milk of Magnesia).    [provider]  IMODIUM A-D 2 MG tablet Take 2 mg by mouth as needed for diarrhea or loose stools.    [provider]  magnesium hydroxide (MILK OF MAGNESIA) 400 MG/5ML suspension Take 30 mLs by mouth daily as needed for mild constipation.    [provider]  Sodium Phosphates (ENEMA) ENEM Place 1 enema rectally daily as needed (for constipation not relieved by biscodyl suppository and contact MD for orders).    [provider]  traMADol (ULTRAM) 50 MG tablet Take 1 tablet (50 mg total) by mouth every 6 (six) hours as needed (for pain). 04/18/22   Darliss Cheney, MD      Allergies:   Ambien [zolpidem], Codeine, Dulaglutide, Latex, Morphine sulfate, and Mirtazapine    ROS:      As stated in the HPI and negative for all other systems.    PHYSICAL EXAM: VS:  BP (!) 82/57   Pulse (!) 55   Ht '5\' 5"'$  (1.651 m)   SpO2 100%   BMI 23.96 kg/m  , BMI Body mass index is 23.96 kg/m. GEN:   No distress, slightly frail. NECK:  No jugular venous distention at 90 degrees, waveform within normal limits, carotid upstroke brisk and symmetric, no bruits, no thyromegaly LYMPHATICS:  No cervical adenopathy LUNGS:  Clear to auscultation bilaterally BACK:  No CVA tenderness CHEST:  Unremarkable HEART:  S1 and S2 within normal limits, no S3, no clicks, no rubs, no murmurs, irregular (distant heart sounds) ABD:  Positive bowel sounds normal in frequency in pitch, no bruits, no rebound, no guarding, unable to assess midline mass or bruit with the patient seated. EXT:  2 plus pulses throughout, moderate right greater than left leg edema, no cyanosis no clubbing SKIN:  No rashes no nodules NEURO:  Cranial nerves II through XII grossly intact, motor grossly intact throughout PSYCH:  Cognitively intact, oriented to person place and time   EKG:  EKG is not ordered today.    Recent Labs: 04/14/2022: ALT 7 04/15/2022: B Natriuretic Peptide 750.3; TSH 3.529 04/16/2022: Hemoglobin 9.4; Magnesium 1.9; Platelets 306 04/17/2022: BUN 20; Creatinine, Ser 0.91; Potassium 4.1; Sodium 137    Lipid Panel    Component Value Date/Time   CHOL 132 03/13/2017 0431   TRIG 72 03/13/2017 0431   HDL 48 03/13/2017 0431   CHOLHDL 2.8 03/13/2017 0431   VLDL 14 03/13/2017 0431   LDLCALC 70 03/13/2017 0431   LDLDIRECT 37 03/10/2009 1106     Wt Readings from Last 3 Encounters:  04/14/22 144 lb (65.3 kg)  03/19/22 148 lb 13 oz (67.5 kg)  02/15/22 143 lb 12.8 oz (65.2 kg)     Other studies Reviewed: Additional studies/ records that were reviewed today include: Hospital records   ASSESSMENT AND PLAN   Permanent atrial fibrillation:    Tara Bradley has a CHA2DS2 - VASc score of 5.  She tolerates anticoagulation is on the appropriate dose.  No change in therapy.  I will continue the amiodarone for now but might discontinue this in a couple of weeks.  Aortic aneurysm with chronic descending  dissection:     We are managing this conservatively.  HTN:  The blood pressure is running very low.  She is not having any symptoms related to this.  I given written instructions to get her blood pressure readings along with her heart rate twice a day and send those to me after about a week.  Certainly if she has any dizziness or lightheadedness with standing I need to probably back off on the Cardizem or beta-blocker.  However, for now I will not make any changes.   DM: Hemoglobin A1c was 7.8 and I will defer to her primary providers.   Hypoxemia:   She is going to continue the 2 L of oxygen.   QT prolongation:   She had this in the hospital.  I would avoid other QT prolonging drugs.  Current medicines are reviewed at length with the patient today.  The patient does not have concerns regarding medicines.    The following changes have been made:   None  Labs/ tests ordered today include: None  No orders of the defined types were placed in this encounter.    Disposition:    Follow up in me in two months.    Signed, [name], MD  04/29/2022 2:15 PM    [facility]

## 2022-04-29 ENCOUNTER — Ambulatory Visit: Payer: Medicare Other | Attending: Cardiology | Admitting: Cardiology

## 2022-04-29 ENCOUNTER — Encounter: Payer: Self-pay | Admitting: Cardiology

## 2022-04-29 VITALS — BP 82/57 | HR 55 | Ht 65.0 in

## 2022-04-29 DIAGNOSIS — I4821 Permanent atrial fibrillation: Secondary | ICD-10-CM | POA: Diagnosis not present

## 2022-04-29 DIAGNOSIS — E118 Type 2 diabetes mellitus with unspecified complications: Secondary | ICD-10-CM | POA: Insufficient documentation

## 2022-04-29 DIAGNOSIS — Z794 Long term (current) use of insulin: Secondary | ICD-10-CM | POA: Insufficient documentation

## 2022-04-29 DIAGNOSIS — I10 Essential (primary) hypertension: Secondary | ICD-10-CM

## 2022-04-29 NOTE — Patient Instructions (Signed)
Medication Instructions:  Your physician recommends that you continue on your current medications as directed. Please refer to the Current Medication list given to you today.  *If you need a refill on your cardiac medications before your next appointment, please call your pharmacy*   Lab Work: NONE If you have labs (blood work) drawn today and your tests are completely normal, you will receive your results only by: [place] (if you have MyChart) OR A paper copy in the mail If you have any lab test that is abnormal or we need to change your treatment, we will call you to review the results.   Testing/Procedures: NONE   Follow-Up: At [facility], you and your health needs are our priority.  As part of our continuing mission to provide you with exceptional heart care, we have created designated Provider Care Teams.  These Care Teams include your primary Cardiologist (physician) and Advanced Practice Providers (APPs -  Physician Assistants and Nurse Practitioners) who all work together to provide you with the care you need, when you need it.  We recommend signing up for the patient portal called "MyChart".  Sign up information is provided on this After Visit Summary.  MyChart is used to connect with patients for Virtual Visits (Telemedicine).  Patients are able to view lab/test results, encounter notes, upcoming appointments, etc.  Non-urgent messages can be sent to your provider as well.   To learn more about what you can do with MyChart, go to [email or web address].    Your next appointment:   2 month(s)  The format for your next appointment:   In Person  Provider:   [name], MD     Other Instructions NONE  Important Information About Sugar

## 2022-05-03 ENCOUNTER — Ambulatory Visit (INDEPENDENT_AMBULATORY_CARE_PROVIDER_SITE_OTHER): Payer: Medicare Other | Admitting: Orthopedic Surgery

## 2022-05-03 ENCOUNTER — Ambulatory Visit (INDEPENDENT_AMBULATORY_CARE_PROVIDER_SITE_OTHER): Payer: Medicare Other

## 2022-05-03 DIAGNOSIS — M25571 Pain in right ankle and joints of right foot: Secondary | ICD-10-CM

## 2022-05-03 NOTE — Progress Notes (Addendum)
Orthopedic Surgery Progress Note     Assessment: Patient is a 88 y.o. female with right bimalleolar ankle fracture being treated non-operatively (~6 weeks from injury)     Plan: -No acute operative intervention -Weight bearing status: non-weight bearing right lower extremity -Can use boot when out of bed, does not need boot when in bed or for bathing. Boot applied in the office -Tylenol for pain control -Is eating plenty of calcium per her report, recommend Vit D 2000IU daily  -Will plan to allow her to weight bear as tolerated at our next visit assuming continued healing and no TTP -Follow up in my office in 4 weeks; x-rays at next visit: right ankle AP/lateral/mortise views   ___________________________________________________________________________   Subjective: Frustrated with non-weight bearing since that is keeping her from going home. Was hoping to be home by Christmas. Not having any pain in the ankle. Denies paresthesias and numbness.      Physical Exam:   General: no acute distress, appears stated age Neurologic: alert, answering questions appropriately, following commands Respiratory: unlabored breathing on room air, symmetric chest rise Psychiatric: appropriate affect, normal cadence to speech   MSK:    -Right lower extremity             No swelling around the ankle, mildly TTP over lateral malleolus, no TTP over the medial malleolus, no pain through ankle range of motion  Dry skin where the splint was EHL/TA/GSC intact Plantarflexes and dorsiflexes toes Sensation intact to light touch in sural, saphenous, tibial, deep peroneal, and superficial peroneal nerve distributions Foot warm and well perfused   XR of the ankle taken 05/03/2022 and independently reviewed by me today shows continued callus formation particularly around the medial malleolus. Alignment maintained. Talus is under the tibia. No new fractures.      Patient name: Tara Bradley Patient MRN:  [id number] Date: 05/03/22

## 2022-05-13 ENCOUNTER — Ambulatory Visit: Payer: Medicare Other | Admitting: Orthopedic Surgery

## 2022-05-23 ENCOUNTER — Emergency Department (HOSPITAL_COMMUNITY)
Admission: EM | Admit: 2022-05-23 | Discharge: 2022-05-24 | Disposition: E | Payer: Medicare Other | Attending: Emergency Medicine | Admitting: Emergency Medicine

## 2022-05-23 ENCOUNTER — Emergency Department (HOSPITAL_COMMUNITY): Payer: Medicare Other

## 2022-05-23 DIAGNOSIS — I5030 Unspecified diastolic (congestive) heart failure: Secondary | ICD-10-CM | POA: Insufficient documentation

## 2022-05-23 DIAGNOSIS — E119 Type 2 diabetes mellitus without complications: Secondary | ICD-10-CM | POA: Diagnosis not present

## 2022-05-23 DIAGNOSIS — R001 Bradycardia, unspecified: Secondary | ICD-10-CM | POA: Diagnosis present

## 2022-05-23 DIAGNOSIS — Z7901 Long term (current) use of anticoagulants: Secondary | ICD-10-CM | POA: Insufficient documentation

## 2022-05-23 DIAGNOSIS — Z794 Long term (current) use of insulin: Secondary | ICD-10-CM | POA: Diagnosis not present

## 2022-05-23 DIAGNOSIS — I959 Hypotension, unspecified: Secondary | ICD-10-CM | POA: Diagnosis not present

## 2022-05-23 DIAGNOSIS — Z7984 Long term (current) use of oral hypoglycemic drugs: Secondary | ICD-10-CM | POA: Diagnosis not present

## 2022-05-23 DIAGNOSIS — R531 Weakness: Secondary | ICD-10-CM | POA: Insufficient documentation

## 2022-05-23 DIAGNOSIS — I110 Hypertensive heart disease with heart failure: Secondary | ICD-10-CM | POA: Insufficient documentation

## 2022-05-23 DIAGNOSIS — Z66 Do not resuscitate: Secondary | ICD-10-CM | POA: Diagnosis not present

## 2022-05-23 DIAGNOSIS — F0390 Unspecified dementia without behavioral disturbance: Secondary | ICD-10-CM | POA: Diagnosis not present

## 2022-05-23 DIAGNOSIS — Z79899 Other long term (current) drug therapy: Secondary | ICD-10-CM | POA: Insufficient documentation

## 2022-05-23 LAB — COMPREHENSIVE METABOLIC PANEL
ALT: 22 U/L (ref 0–44)
AST: 34 U/L (ref 15–41)
Albumin: 2.8 g/dL — ABNORMAL LOW (ref 3.5–5.0)
Alkaline Phosphatase: 124 U/L (ref 38–126)
Anion gap: 17 — ABNORMAL HIGH (ref 5–15)
BUN: 66 mg/dL — ABNORMAL HIGH (ref 8–23)
CO2: 17 mmol/L — ABNORMAL LOW (ref 22–32)
Calcium: 8.4 mg/dL — ABNORMAL LOW (ref 8.9–10.3)
Chloride: 94 mmol/L — ABNORMAL LOW (ref 98–111)
Creatinine, Ser: 2.3 mg/dL — ABNORMAL HIGH (ref 0.44–1.00)
GFR, Estimated: 19 mL/min — ABNORMAL LOW (ref >60)
Glucose, Bld: 442 mg/dL — ABNORMAL HIGH (ref 70–99)
Potassium: 6.9 mmol/L (ref 3.5–5.1)
Sodium: 128 mmol/L — ABNORMAL LOW (ref 135–145)
Total Bilirubin: 0.6 mg/dL (ref 0.3–1.2)
Total Protein: 5.8 g/dL — ABNORMAL LOW (ref 6.5–8.1)

## 2022-05-23 LAB — CBC WITH DIFFERENTIAL/PLATELET
Abs Immature Granulocytes: 0.49 10*3/uL — ABNORMAL HIGH (ref 0.00–0.07)
Basophils Absolute: 0.1 10*3/uL (ref 0.0–0.1)
Basophils Relative: 1 %
Eosinophils Absolute: 0.1 10*3/uL (ref 0.0–0.5)
Eosinophils Relative: 1 %
HCT: 29.7 % — ABNORMAL LOW (ref 36.0–46.0)
Hemoglobin: 9 g/dL — ABNORMAL LOW (ref 12.0–15.0)
Immature Granulocytes: 5 %
Lymphocytes Relative: 10 %
Lymphs Abs: 1 10*3/uL (ref 0.7–4.0)
MCH: 29.7 pg (ref 26.0–34.0)
MCHC: 30.3 g/dL (ref 30.0–36.0)
MCV: 98 fL (ref 80.0–100.0)
Monocytes Absolute: 0.8 10*3/uL (ref 0.1–1.0)
Monocytes Relative: 7 %
Neutro Abs: 7.9 10*3/uL — ABNORMAL HIGH (ref 1.7–7.7)
Neutrophils Relative %: 76 %
Platelets: 290 10*3/uL (ref 150–400)
RBC: 3.03 MIL/uL — ABNORMAL LOW (ref 3.87–5.11)
RDW: 17.6 % — ABNORMAL HIGH (ref 11.5–15.5)
WBC: 10.3 10*3/uL (ref 4.0–10.5)
nRBC: 0 % (ref 0.0–0.2)

## 2022-05-23 LAB — MAGNESIUM: Magnesium: 2.1 mg/dL (ref 1.7–2.4)

## 2022-05-23 LAB — TROPONIN I (HIGH SENSITIVITY): Troponin I (High Sensitivity): 28 ng/L — ABNORMAL HIGH (ref <18)

## 2022-05-23 MED ORDER — ATROPINE SULFATE 1 MG/10ML IJ SOSY
1.0000 mg | PREFILLED_SYRINGE | Freq: Once | INTRAMUSCULAR | Status: AC
  Administered 2022-05-23: 1 mg via INTRAVENOUS

## 2022-05-23 MED ORDER — DOPAMINE-DEXTROSE 3.2-5 MG/ML-% IV SOLN
0.0000 ug/kg/min | INTRAVENOUS | Status: DC
  Administered 2022-05-23: 5 ug/kg/min via INTRAVENOUS
  Filled 2022-05-23: qty 250

## 2022-05-23 MED ORDER — ATROPINE SULFATE 1 MG/10ML IJ SOSY
PREFILLED_SYRINGE | INTRAMUSCULAR | Status: AC
  Filled 2022-05-23: qty 10

## 2022-05-23 MED ORDER — DOPAMINE-DEXTROSE 3.2-5 MG/ML-% IV SOLN: 0.0000 ug/kg/min | INTRAVENOUS | Status: DC

## 2022-05-24 NOTE — ED Triage Notes (Signed)
Pt BIB EMS from SNF after she became lethargic with concern for CVA. PT was bradycardic and hypotensive with EMS, EMS place 18G and gave 2 rounds of atropine with improvement. Pt is now restless and will not respond to questions. Pt's baseline is A&Ox3 with dementia.

## 2022-05-24 NOTE — ED Notes (Signed)
Writer went into patient's room at roughly 7:25pm to assess. Pt's HR in 30s. Writer went to grab dopamine infusion and started infusion. Writer then had EMT help obtain rectal temperature. Immediately after rectal temperature obtained, patient's HR went into 20s and then 10s. Provider immediately notified. Writer obtained atropine per verbal order. When patient returned with atropine, patient's HR was 0. Writer gave atropine. Provider then called [facility] after verifying DNR, no pulses.

## 2022-05-24 NOTE — ED Provider Notes (Signed)
[facility] EMERGENCY DEPARTMENT Provider Note   CSN: [id number] Arrival date & time: 19-Jun-2022  1812     History  Chief Complaint  Patient presents with   Weakness   Hypotension   Bradycardia    Tara Bradley is a 89 y.o. female. Past medical history of type 2 diabetes, hypertension, thoracic aortic aneurysm, diastolic HF, chronic atrial fibrillation anticoagulated on Xarelto who presents to the emergency department with weakness.  Level 5 Caveat: Dementia   From EMS, they were called out to facility for report of patient being lethargic today. When they arrived her HR was 25 and BP 70/40. They gave '1mg'$  atropine with improvement in HR and subsequently gave a '2mg'$  dose en route. They note she stopped taking her Cardizem as of yesterday. Unclear reason.   HPI     Home Medications Prior to Admission medications   Medication Sig Start Date End Date Taking? Authorizing Provider  acetaminophen (TYLENOL) 325 MG tablet Take 650 mg by mouth every 6 (six) hours as needed (for pain).    [provider]  amiodarone (PACERONE) 200 MG tablet Take 1 tablet (200 mg total) by mouth daily. 04/21/22 05/21/22  Darliss Cheney, MD  BD PEN NEEDLE NANO U/F 32G X 4 MM MISC Inject into the skin daily. 11/25/20   [provider]  bisacodyl (DULCOLAX) 10 MG suppository Place 10 mg rectally as needed for moderate constipation (if no relief from Milk of Magnesia).    [provider]  Calcium Carb-Cholecalciferol (CALCIUM 600+D3 PO) Take 1 tablet by mouth in the morning.    [provider]  Cholecalciferol (VITAMIN D3) 125 MCG (5000 UT) CAPS Take 5,000 Units by mouth daily.    [provider]  diltiazem (CARDIZEM CD) 240 MG 24 hr capsule Take 2 capsules (480 mg total) by mouth daily. 03/23/22 04/29/22  Darliss Cheney, MD  ferrous gluconate (FERGON) 324 MG tablet Take 324 mg by mouth 2 (two) times daily with a meal.    [provider]  [place] (PREVALON) MISC Apply 1 Device topically See admin instructions. Apply Prevalon boot to the left foot while in bed and may remove for AM/PM care    [provider]  furosemide (LASIX) 80 MG tablet TAKE ONE TABLET TWICE DAILY AT 8am AND 9pm FOR edema/chf Patient taking differently: Take 80 mg by mouth See admin instructions. Take 80 mg by mouth at 10 AM and 5 PM 12/22/21   [name], MD  IMODIUM A-D 2 MG tablet Take 2 mg by mouth as needed for diarrhea or loose stools.    [provider]  Insulin Glargine Solostar (LANTUS) 100 UNIT/ML Solostar Pen Inject 12 Units into the skin at bedtime.    [provider]  magnesium hydroxide (MILK OF MAGNESIA) 400 MG/5ML suspension Take 30 mLs by mouth daily as needed for mild constipation.    [provider]  metFORMIN (GLUCOPHAGE-XR) 500 MG 24 hr tablet Take 1,000 mg by mouth at bedtime.    [provider]  metoprolol succinate (TOPROL-XL) 100 MG 24 hr tablet TAKE ONE TABLET BY MOUTH EVERY MORNING Patient taking differently: Take 100 mg by mouth daily. 03/10/22   [name], MD  NOVOLOG FLEXPEN 100 UNIT/ML FlexPen Inject 0-9 Units into the skin See admin instructions. Inject 0-9 units into the skin 3 times a day, per sliding scale: BGL 70-120 = give nothing; 121-150 = 1 unit; 151-200 = 2 units; 201-250 = 3 units; 251-300 =  5 units; 301-350 = 7 units; 351- 400 = 9 units; >400 = call MD    [provider]  OXYGEN Inhale 2 L/min into the lungs as needed (to maintain sats of 90%).    [provider]  polyethylene glycol (MIRALAX / GLYCOLAX) 17 g packet Take 17 g by mouth daily. 05/12/21   Shelly Coss, MD  potassium chloride SA (KLOR-CON M) 20 MEQ tablet TAKE TWO TABLETS EVERY EVENING AT FIVE 12/22/21   [name], MD  senna (SENOKOT) 8.6 MG TABS tablet Take 1 tablet (8.6 mg total) by mouth 2 (two) times daily. 08/07/21   Shelly Coss, MD  Sodium Phosphates (ENEMA) ENEM Place  1 enema rectally daily as needed (for constipation not relieved by biscodyl suppository and contact MD for orders).    [provider]  spironolactone (ALDACTONE) 25 MG tablet Take 12.5 mg by mouth daily.    [provider]  tamsulosin (FLOMAX) 0.4 MG CAPS capsule TAKE ONE CAPSULE BY MOUTH IN THE MORNING Patient taking differently: Take 0.4 mg by mouth daily. 01/29/22   [name], MD  traMADol (ULTRAM) 50 MG tablet Take 1 tablet (50 mg total) by mouth every 6 (six) hours as needed (for pain). 04/18/22   Pahwani, Einar Grad, MD  XARELTO 15 MG TABS tablet TAKE 1 TABLET ONCE DAILY WITH SUPPER. Patient taking differently: Take 15 mg by mouth daily with supper. 06/22/21   [name], MD      Allergies    Ambien [zolpidem], Codeine, Dulaglutide, Latex, Morphine sulfate, and Mirtazapine    Review of Systems   Review of Systems  Unable to perform ROS: Dementia    Physical Exam Updated Vital Signs BP 100/65   Pulse (!) 32   Resp (!) 0   Wt 65.3 kg   SpO2 (!) 85%   BMI 23.96 kg/m  Physical Exam Vitals and nursing note reviewed.  Constitutional:      General: She is in acute distress.     Appearance: She is ill-appearing.  HENT:     Head: Normocephalic and atraumatic.     Mouth/Throat:     Mouth: Mucous membranes are dry.     Pharynx: Oropharynx is clear.  Eyes:     General: No scleral icterus.    Pupils: Pupils are equal, round, and reactive to light.  Cardiovascular:     Rate and Rhythm: Bradycardia present. Rhythm irregular.     Heart sounds: Normal heart sounds. No murmur heard. Pulmonary:     Breath sounds: Normal breath sounds.  Abdominal:     General: Bowel sounds are normal. There is no distension.     Palpations: Abdomen is soft.  Skin:    General: Skin is warm and dry.     Capillary Refill: Capillary refill takes less than 2 seconds.  Neurological:     Motor: Weakness present.     ED Results / Procedures / Treatments   Labs (all labs ordered  are listed, but only abnormal results are displayed) Labs Reviewed  CBC WITH DIFFERENTIAL/PLATELET - Abnormal; Notable for the following components:      Result Value   RBC 3.03 (*)    Hemoglobin 9.0 (*)    HCT 29.7 (*)    RDW 17.6 (*)    Neutro Abs 7.9 (*)    Abs Immature Granulocytes 0.49 (*)    All other components within normal limits  RESP PANEL BY RT-PCR (RSV, FLU A&B, COVID)  RVPGX2  COMPREHENSIVE METABOLIC PANEL  URINALYSIS,  ROUTINE W REFLEX MICROSCOPIC  MAGNESIUM  BRAIN NATRIURETIC PEPTIDE  LACTIC ACID, PLASMA  LACTIC ACID, PLASMA  TROPONIN I (HIGH SENSITIVITY)  TROPONIN I (HIGH SENSITIVITY)    EKG EKG Interpretation  Date/Time:  03-Jun-2022 19:35:01 EST Ventricular Rate:  0 PR Interval:    QRS Duration: 129 QT Interval:  502 QTC Calculation: 514 R Axis:   0 Text Interpretation: [place] Confirmed by Lennice Sites [phone] on Jun 03, 2022 7:53:30 PM  Radiology DG Chest Port 1 View  Result Date: 2022/06/03 CLINICAL DATA:  Shortness of breath. EXAM: PORTABLE CHEST 1 VIEW COMPARISON:  April 15, 2022 FINDINGS: The heart size and mediastinal contours are within normal limits. There is marked severity calcification of the thoracic aorta. Low lung volumes are noted with mild atelectatic changes seen within the bilateral lung bases. There is no evidence of a pleural effusion or pneumothorax. Multiple chronic bilateral rib fractures are seen. A right shoulder replacement is noted. There is marked severity dextroscoliosis of mid to lower thoracic spine. IMPRESSION: Low lung volumes with mild bibasilar atelectasis. Electronically Signed   By: Virgina Norfolk M.D.   On: 06-03-22 18:45    Procedures Procedures    Medications Ordered in ED Medications  DOPamine (INTROPIN) 800 mg in dextrose 5 % 250 mL (3.2 mg/mL) infusion (5 mcg/kg/min  65.3 kg Intravenous New Bag/Given Jun 03, 2022 1926)  atropine 1 MG/10ML injection (has no administration in time range)   atropine 1 MG/10ML injection 1 mg (1 mg Intravenous Given 06-03-2022 1928)    ED Course/ Medical Decision Making/ A&P Clinical Course as of 06/03/2022 2004  Sun 06-03-2022 Attempted to call son Lucita Ferrara with no answer  [LA]  1956 Attempted to call son Lucita Ferrara, son and POA multiple times on multiple different numbers. No answer. I was able to get in contact with her Nephew, Leanord Hawking to notify of death. He states he will try to get in contact with patient's son, and will contact patient's daughter who is international.  [LA]    Clinical Course User Index [LA] Mickie Hillier, PA-C                           Medical Decision Making Amount and/or Complexity of Data Reviewed Labs: ordered. Radiology: ordered.  Risk Prescription drug management.  Initial Impression and Ddx 89 year old female who presents with bradycardia. She is agitated on my initial exam. HR is 39-42. BP is soft. Obtaining labs including BNP, troponin, as well as lactic, resp panel, UA, mag. Will obtain CXR and paging cardiology.  Patient PMH that increases complexity of ED encounter: Hypertension, diastolic heart failure, chronic atrial fibrillation, thoracic aortic aneurysm Differential: Complete heart block, medication side effect, sepsis, cardiogenic shock, etc.  Interpretation of Diagnostics I independent reviewed and interpreted the labs as followed: cbc with stable anemia   - I independently visualized the following imaging with scope of interpretation limited to determining acute life threatening conditions related to emergency care: Chest x-ray, which revealed mild bibasilar atelectasis  Patient Reassessment and Ultimate Disposition/Management 1915: ordered dopamine for further hypotension and IVF. Pending callback from cardiology. I also attempted to call the son in her chart who is her POA to discuss patient status, her wishes, etc. He did not answer. DNR paperwork is at  bedside.  1931: Called to bedside by RN as patient HR decreased to 19. Dopamine and fluids are infusing. I requested Atropine which was also given.  Ultimately, the patient lost pulses and stopped breathing despite dopamine and atropine. Patient had asystolic rhythm on the monitor. DNR is with patient at bedside and we allowed her to pass peacefully. Time of death was recorded at 1. Unclear reason for her bradycardia. She has known history of atrial fibrillation and recently stopped Cardizem. This could have been cardiogenic shock. We were also completing a sepsis work up given she was hypothermic and lethargic. Did not observe any trauma to the patient to have concern for this. Attending Dr. Ronnald Nian was at bedside for the entirety of patient worsening bradycardia and death.   Next of kin notified as below.  Clinical Course as of May 30, 2022 2001  Sun 2022-05-30  1909 Attempted to call son Lucita Ferrara with no answer  [LA]  1956 Attempted to call son Lucita Ferrara, son and POA multiple times on multiple different numbers. No answer. I was able to get in contact with her Nephew, Leanord Hawking to notify of death. He states he will try to get in contact with patient's son, and will contact patient's daughter who is international.  [LA]    Clinical Course User Index [LA] Bing Matter   I discussed this case with my attending physician who cosigned this note including patient's presenting symptoms, physical exam, and planned diagnostics and interventions. Attending physician stated agreement with plan or made changes to plan which were implemented.   Attending physician assessed patient at bedside.  Patient management required discussion with the following services or consulting groups:  Cardiology  Complexity of Problems Addressed Acute illness or injury that poses threat of life of bodily function  Additional Data Reviewed and Analyzed Further history obtained from: Past  medical history and medications listed in the EMR, Recent PCP notes, Care Everywhere, and Prior labs/imaging results  Patient Encounter Risk Assessment Consideration of hospitalization  Final Clinical Impression(s) / ED Diagnoses Final diagnoses:  Bradycardia    Rx / DC Orders ED Discharge Orders     None         Mickie Hillier, PA-C 05-30-2022 2005    Lennice Sites, DO 2022-05-30 2007

## 2022-05-24 NOTE — ED Provider Notes (Addendum)
Shared PA visit.  Patient here with lethargy from nursing facility.  Found to be bradycardic and hypotensive and given atropine in the field.  Heart rate on my evaluation appears to be slow A-fib in the 40s.  Blood pressure 65/40.  Fluid bolus initiated.  Will consider starting dopamine.  She is got dementia at baseline.  Cardiology note from a few weeks ago shows that her blood pressure was 80 systolic in the clinic.  She is on metoprolol and diltiazem.  Suspect that this is medication related.  Will evaluate for infectious process, other causes of hypotension.  Will talk with cardiology team.  I will continue to help with history, medical decision making with the PA.  While workup was being initiated we were called to the bedside as patient had another significant bradycardic event.  She quickly bradyed down into the 40s and eventually into asystole.  We gave a dose of atropine, patient already been on dopamine.  Ultimately patient is DNR, she went to asystole.  Breathing was pronounced deceased at 53.  We have made attempts to try to let the next of kin made aware.  There will be no MD exam is without concerned about any trauma.  Will have the certificate sent to primary care doctor.  My suspicion is that this is likely cardiac process.  Sounds like she had not been doing well over the last several weeks.  She did have a fever.  She did not have pneumonia on chest x-ray per my review and interpretation.  She did have a significant leukocytosis or anemia.  Blood work is still pending including troponin and CMP but ultimately I suspect patient died of natural causes due to her chronic comorbidities.  This chart was dictated using voice recognition software.  Despite best efforts to proofread,  errors can occur which can change the documentation meaning.    Lennice Sites, DO 19-Jun-2022 Angus Chapel, [place], DO 06/19/22 1952

## 2022-05-24 DEATH — deceased

## 2022-05-31 ENCOUNTER — Ambulatory Visit: Payer: Medicare Other | Admitting: Orthopedic Surgery

## 2022-06-30 ENCOUNTER — Other Ambulatory Visit: Payer: Medicare Other

## 2022-07-02 ENCOUNTER — Ambulatory Visit: Payer: Medicare Other | Admitting: Cardiology
